# Patient Record
Sex: Female | Born: 1975 | Race: Black or African American | Hispanic: No | Smoking: Former smoker
Health system: Southern US, Community
[De-identification: ages and names within clinical notes are randomized; demographics above are authoritative.]

## PROBLEM LIST (undated history)

## (undated) ENCOUNTER — Inpatient Hospital Stay (HOSPITAL_COMMUNITY): Payer: Self-pay

## (undated) ENCOUNTER — Ambulatory Visit: Admission: EM | Payer: Medicare Other

## (undated) DIAGNOSIS — G43909 Migraine, unspecified, not intractable, without status migrainosus: Secondary | ICD-10-CM

## (undated) DIAGNOSIS — D649 Anemia, unspecified: Secondary | ICD-10-CM

## (undated) DIAGNOSIS — T4145XA Adverse effect of unspecified anesthetic, initial encounter: Secondary | ICD-10-CM

## (undated) DIAGNOSIS — M109 Gout, unspecified: Secondary | ICD-10-CM

## (undated) DIAGNOSIS — M1990 Unspecified osteoarthritis, unspecified site: Secondary | ICD-10-CM

## (undated) DIAGNOSIS — N186 End stage renal disease: Secondary | ICD-10-CM

## (undated) DIAGNOSIS — J45909 Unspecified asthma, uncomplicated: Secondary | ICD-10-CM

## (undated) DIAGNOSIS — F329 Major depressive disorder, single episode, unspecified: Secondary | ICD-10-CM

## (undated) DIAGNOSIS — F32A Depression, unspecified: Secondary | ICD-10-CM

## (undated) DIAGNOSIS — N289 Disorder of kidney and ureter, unspecified: Secondary | ICD-10-CM

## (undated) DIAGNOSIS — I10 Essential (primary) hypertension: Secondary | ICD-10-CM

## (undated) DIAGNOSIS — F419 Anxiety disorder, unspecified: Secondary | ICD-10-CM

## (undated) DIAGNOSIS — H33322 Round hole, left eye: Secondary | ICD-10-CM

## (undated) DIAGNOSIS — Z992 Dependence on renal dialysis: Secondary | ICD-10-CM

## (undated) DIAGNOSIS — Z9289 Personal history of other medical treatment: Secondary | ICD-10-CM

## (undated) DIAGNOSIS — R569 Unspecified convulsions: Secondary | ICD-10-CM

## (undated) DIAGNOSIS — T8859XA Other complications of anesthesia, initial encounter: Secondary | ICD-10-CM

## (undated) DIAGNOSIS — K219 Gastro-esophageal reflux disease without esophagitis: Secondary | ICD-10-CM

## (undated) HISTORY — PX: RENAL BIOPSY: SHX156

## (undated) HISTORY — PX: HYDRADENITIS EXCISION: SHX5243

---
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
## 1999-03-17 HISTORY — PX: CERVICAL BIOPSY  W/ LOOP ELECTRODE EXCISION: SUR135

## 2004-02-29 ENCOUNTER — Emergency Department (HOSPITAL_COMMUNITY): Admission: EM | Admit: 2004-02-29 | Discharge: 2004-03-01 | Payer: Self-pay | Admitting: Emergency Medicine

## 2004-12-20 ENCOUNTER — Emergency Department (HOSPITAL_COMMUNITY): Admission: EM | Admit: 2004-12-20 | Discharge: 2004-12-20 | Payer: Self-pay | Admitting: Emergency Medicine

## 2004-12-21 ENCOUNTER — Emergency Department (HOSPITAL_COMMUNITY): Admission: EM | Admit: 2004-12-21 | Discharge: 2004-12-21 | Payer: Self-pay | Admitting: Emergency Medicine

## 2005-07-04 ENCOUNTER — Emergency Department (HOSPITAL_COMMUNITY): Admission: EM | Admit: 2005-07-04 | Discharge: 2005-07-04 | Payer: Self-pay | Admitting: Emergency Medicine

## 2005-07-05 ENCOUNTER — Emergency Department (HOSPITAL_COMMUNITY): Admission: EM | Admit: 2005-07-05 | Discharge: 2005-07-05 | Payer: Self-pay | Admitting: Emergency Medicine

## 2006-01-07 ENCOUNTER — Ambulatory Visit (HOSPITAL_COMMUNITY): Admission: RE | Admit: 2006-01-07 | Discharge: 2006-01-07 | Payer: Self-pay | Admitting: Obstetrics

## 2006-05-06 ENCOUNTER — Ambulatory Visit (HOSPITAL_COMMUNITY): Admission: RE | Admit: 2006-05-06 | Discharge: 2006-05-06 | Payer: Self-pay | Admitting: Otolaryngology

## 2006-05-26 ENCOUNTER — Inpatient Hospital Stay (HOSPITAL_COMMUNITY): Admission: AD | Admit: 2006-05-26 | Discharge: 2006-05-27 | Payer: Self-pay | Admitting: Obstetrics and Gynecology

## 2006-08-17 ENCOUNTER — Emergency Department (HOSPITAL_COMMUNITY): Admission: EM | Admit: 2006-08-17 | Discharge: 2006-08-18 | Payer: Self-pay | Admitting: Emergency Medicine

## 2007-02-07 ENCOUNTER — Emergency Department (HOSPITAL_COMMUNITY): Admission: EM | Admit: 2007-02-07 | Discharge: 2007-02-07 | Payer: Self-pay | Admitting: Family Medicine

## 2007-02-23 ENCOUNTER — Inpatient Hospital Stay (HOSPITAL_COMMUNITY): Admission: AD | Admit: 2007-02-23 | Discharge: 2007-02-28 | Payer: Self-pay | Admitting: Psychiatry

## 2007-02-23 ENCOUNTER — Ambulatory Visit: Payer: Self-pay | Admitting: Psychiatry

## 2007-03-29 ENCOUNTER — Emergency Department (HOSPITAL_COMMUNITY): Admission: EM | Admit: 2007-03-29 | Discharge: 2007-03-29 | Payer: Self-pay | Admitting: Emergency Medicine

## 2007-07-31 ENCOUNTER — Emergency Department (HOSPITAL_COMMUNITY): Admission: EM | Admit: 2007-07-31 | Discharge: 2007-08-01 | Payer: Self-pay | Admitting: Emergency Medicine

## 2007-10-21 ENCOUNTER — Emergency Department (HOSPITAL_COMMUNITY): Admission: EM | Admit: 2007-10-21 | Discharge: 2007-10-21 | Payer: Self-pay | Admitting: Emergency Medicine

## 2008-03-16 HISTORY — PX: AV FISTULA PLACEMENT: SHX1204

## 2008-08-06 ENCOUNTER — Ambulatory Visit (HOSPITAL_COMMUNITY): Admission: RE | Admit: 2008-08-06 | Discharge: 2008-08-06 | Payer: Self-pay | Admitting: Specialist

## 2008-08-24 ENCOUNTER — Ambulatory Visit (HOSPITAL_COMMUNITY): Admission: RE | Admit: 2008-08-24 | Discharge: 2008-08-24 | Payer: Self-pay | Admitting: Nephrology

## 2008-09-10 ENCOUNTER — Encounter: Admission: RE | Admit: 2008-09-10 | Discharge: 2008-09-10 | Payer: Self-pay | Admitting: Family Medicine

## 2008-09-10 ENCOUNTER — Ambulatory Visit: Payer: Self-pay | Admitting: Surgery

## 2008-09-21 ENCOUNTER — Ambulatory Visit: Payer: Self-pay | Admitting: Surgery

## 2008-09-21 ENCOUNTER — Ambulatory Visit (HOSPITAL_COMMUNITY): Admission: RE | Admit: 2008-09-21 | Discharge: 2008-09-21 | Payer: Self-pay | Admitting: Surgery

## 2008-11-05 ENCOUNTER — Ambulatory Visit: Payer: Self-pay | Admitting: Surgery

## 2008-11-16 ENCOUNTER — Ambulatory Visit (HOSPITAL_COMMUNITY): Admission: RE | Admit: 2008-11-16 | Discharge: 2008-11-16 | Payer: Self-pay | Admitting: Vascular Surgery

## 2008-11-16 ENCOUNTER — Ambulatory Visit: Payer: Self-pay | Admitting: Vascular Surgery

## 2008-12-24 ENCOUNTER — Ambulatory Visit: Payer: Self-pay | Admitting: Surgery

## 2009-01-02 ENCOUNTER — Ambulatory Visit: Payer: Self-pay | Admitting: Surgery

## 2009-01-02 ENCOUNTER — Ambulatory Visit (HOSPITAL_COMMUNITY): Admission: RE | Admit: 2009-01-02 | Discharge: 2009-01-02 | Payer: Self-pay | Admitting: Surgery

## 2009-01-13 ENCOUNTER — Encounter: Admission: RE | Admit: 2009-01-13 | Discharge: 2009-01-13 | Payer: Self-pay | Admitting: Nephrology

## 2009-01-14 ENCOUNTER — Ambulatory Visit: Payer: Self-pay | Admitting: Surgery

## 2009-01-23 ENCOUNTER — Encounter: Admission: RE | Admit: 2009-01-23 | Discharge: 2009-01-23 | Payer: Self-pay | Admitting: Nephrology

## 2009-01-25 ENCOUNTER — Encounter: Admission: RE | Admit: 2009-01-25 | Discharge: 2009-01-25 | Payer: Self-pay | Admitting: Nephrology

## 2009-03-04 ENCOUNTER — Ambulatory Visit: Payer: Self-pay | Admitting: Surgery

## 2009-03-25 ENCOUNTER — Ambulatory Visit (HOSPITAL_COMMUNITY): Admission: RE | Admit: 2009-03-25 | Discharge: 2009-03-25 | Payer: Self-pay | Admitting: Nephrology

## 2009-04-10 ENCOUNTER — Ambulatory Visit (HOSPITAL_COMMUNITY): Admission: RE | Admit: 2009-04-10 | Discharge: 2009-04-10 | Payer: Self-pay | Admitting: Obstetrics

## 2009-07-09 ENCOUNTER — Emergency Department (HOSPITAL_COMMUNITY): Admission: EM | Admit: 2009-07-09 | Discharge: 2009-07-09 | Payer: Self-pay | Admitting: Family Medicine

## 2009-08-23 ENCOUNTER — Emergency Department (HOSPITAL_COMMUNITY): Admission: EM | Admit: 2009-08-23 | Discharge: 2009-08-23 | Payer: Self-pay | Admitting: Emergency Medicine

## 2009-08-28 ENCOUNTER — Emergency Department (HOSPITAL_COMMUNITY): Admission: EM | Admit: 2009-08-28 | Discharge: 2009-08-28 | Payer: Self-pay | Admitting: Family Medicine

## 2009-09-02 ENCOUNTER — Ambulatory Visit (HOSPITAL_BASED_OUTPATIENT_CLINIC_OR_DEPARTMENT_OTHER): Admission: RE | Admit: 2009-09-02 | Discharge: 2009-09-02 | Payer: Self-pay | Admitting: Specialist

## 2009-09-30 ENCOUNTER — Ambulatory Visit: Payer: Self-pay | Admitting: Surgery

## 2009-12-12 ENCOUNTER — Ambulatory Visit: Payer: Self-pay | Admitting: Surgery

## 2010-03-03 ENCOUNTER — Emergency Department (HOSPITAL_COMMUNITY)
Admission: EM | Admit: 2010-03-03 | Discharge: 2010-03-03 | Disposition: A | Discharge status: Other Acute Inpatient Hospital | Payer: Self-pay | Source: Home / Self Care | Admitting: Emergency Medicine

## 2010-03-03 ENCOUNTER — Encounter (INDEPENDENT_AMBULATORY_CARE_PROVIDER_SITE_OTHER): Payer: Self-pay | Admitting: Emergency Medicine

## 2010-03-03 ENCOUNTER — Emergency Department (HOSPITAL_COMMUNITY)
Admission: EM | Admit: 2010-03-03 | Discharge: 2010-03-03 | Payer: Self-pay | Source: Home / Self Care | Admitting: Emergency Medicine

## 2010-05-30 ENCOUNTER — Other Ambulatory Visit: Payer: Self-pay | Admitting: Emergency Medicine

## 2010-05-30 ENCOUNTER — Emergency Department (HOSPITAL_COMMUNITY)
Admission: EM | Admit: 2010-05-30 | Discharge: 2010-05-30 | Disposition: A | Discharge status: Home / Self Care | Payer: Medicare Other | Attending: Emergency Medicine | Admitting: Emergency Medicine

## 2010-05-30 DIAGNOSIS — H5710 Ocular pain, unspecified eye: Secondary | ICD-10-CM | POA: Insufficient documentation

## 2010-05-30 DIAGNOSIS — R509 Fever, unspecified: Secondary | ICD-10-CM | POA: Insufficient documentation

## 2010-05-30 DIAGNOSIS — M549 Dorsalgia, unspecified: Secondary | ICD-10-CM | POA: Insufficient documentation

## 2010-05-30 DIAGNOSIS — L732 Hidradenitis suppurativa: Secondary | ICD-10-CM | POA: Insufficient documentation

## 2010-05-30 DIAGNOSIS — Z79899 Other long term (current) drug therapy: Secondary | ICD-10-CM | POA: Insufficient documentation

## 2010-05-30 DIAGNOSIS — J309 Allergic rhinitis, unspecified: Secondary | ICD-10-CM | POA: Insufficient documentation

## 2010-05-30 DIAGNOSIS — Z992 Dependence on renal dialysis: Secondary | ICD-10-CM | POA: Insufficient documentation

## 2010-05-30 DIAGNOSIS — M7989 Other specified soft tissue disorders: Secondary | ICD-10-CM | POA: Insufficient documentation

## 2010-05-30 DIAGNOSIS — M25539 Pain in unspecified wrist: Secondary | ICD-10-CM | POA: Insufficient documentation

## 2010-05-30 DIAGNOSIS — H9209 Otalgia, unspecified ear: Secondary | ICD-10-CM | POA: Insufficient documentation

## 2010-05-30 DIAGNOSIS — R51 Headache: Secondary | ICD-10-CM | POA: Insufficient documentation

## 2010-05-30 DIAGNOSIS — J3489 Other specified disorders of nose and nasal sinuses: Secondary | ICD-10-CM | POA: Insufficient documentation

## 2010-05-30 DIAGNOSIS — M79609 Pain in unspecified limb: Secondary | ICD-10-CM | POA: Insufficient documentation

## 2010-05-30 DIAGNOSIS — K219 Gastro-esophageal reflux disease without esophagitis: Secondary | ICD-10-CM | POA: Insufficient documentation

## 2010-05-30 DIAGNOSIS — F329 Major depressive disorder, single episode, unspecified: Secondary | ICD-10-CM | POA: Insufficient documentation

## 2010-05-30 DIAGNOSIS — N186 End stage renal disease: Secondary | ICD-10-CM | POA: Insufficient documentation

## 2010-05-30 DIAGNOSIS — R6889 Other general symptoms and signs: Secondary | ICD-10-CM | POA: Insufficient documentation

## 2010-05-30 DIAGNOSIS — F3289 Other specified depressive episodes: Secondary | ICD-10-CM | POA: Insufficient documentation

## 2010-06-01 LAB — POCT I-STAT, CHEM 8
BUN: 45 mg/dL — ABNORMAL HIGH (ref 6–23)
Chloride: 105 mEq/L (ref 96–112)
Sodium: 140 mEq/L (ref 135–145)

## 2010-06-01 LAB — DIFFERENTIAL
Basophils Absolute: 0 10*3/uL (ref 0.0–0.1)
Lymphocytes Relative: 32 % (ref 12–46)
Neutro Abs: 4 10*3/uL (ref 1.7–7.7)

## 2010-06-01 LAB — BASIC METABOLIC PANEL
BUN: 28 mg/dL — ABNORMAL HIGH (ref 6–23)
Calcium: 9.4 mg/dL (ref 8.4–10.5)
GFR calc non Af Amer: 5 mL/min — ABNORMAL LOW (ref >60)
Glucose, Bld: 123 mg/dL — ABNORMAL HIGH (ref 70–99)
Sodium: 134 mEq/L — ABNORMAL LOW (ref 135–145)

## 2010-06-01 LAB — CBC
Hemoglobin: 11.4 g/dL — ABNORMAL LOW (ref 12.0–15.0)
Platelets: 229 10*3/uL (ref 150–400)
RDW: 13.9 % (ref 11.5–15.5)
WBC: 6.6 10*3/uL (ref 4.0–10.5)

## 2010-06-02 LAB — POCT I-STAT, CHEM 8
BUN: 33 mg/dL — ABNORMAL HIGH (ref 6–23)
Chloride: 107 mEq/L (ref 96–112)
Potassium: 3.3 mEq/L — ABNORMAL LOW (ref 3.5–5.1)
Sodium: 139 mEq/L (ref 135–145)

## 2010-06-02 LAB — RAPID STREP SCREEN (MED CTR MEBANE ONLY): Streptococcus, Group A Screen (Direct): NEGATIVE

## 2010-06-19 LAB — POCT I-STAT, CHEM 8
BUN: 38 mg/dL — ABNORMAL HIGH (ref 6–23)
Creatinine, Ser: 9.2 mg/dL — ABNORMAL HIGH (ref 0.4–1.2)
Hemoglobin: 13.3 g/dL (ref 12.0–15.0)
Potassium: 3.8 mEq/L (ref 3.5–5.1)
Sodium: 137 mEq/L (ref 135–145)

## 2010-06-20 LAB — POCT I-STAT 4, (NA,K, GLUC, HGB,HCT)
Hemoglobin: 16.3 g/dL — ABNORMAL HIGH (ref 12.0–15.0)
Potassium: 3.8 mEq/L (ref 3.5–5.1)
Sodium: 136 mEq/L (ref 135–145)

## 2010-06-22 LAB — POCT I-STAT 4, (NA,K, GLUC, HGB,HCT)
HCT: 37 % (ref 36.0–46.0)
Hemoglobin: 12.6 g/dL (ref 12.0–15.0)
Potassium: 3.8 mEq/L (ref 3.5–5.1)
Potassium: 4.2 mEq/L (ref 3.5–5.1)
Sodium: 137 mEq/L (ref 135–145)

## 2010-06-22 LAB — CBC
HCT: 35 % — ABNORMAL LOW (ref 36.0–46.0)
Platelets: 254 10*3/uL (ref 150–400)
RDW: 16 % — ABNORMAL HIGH (ref 11.5–15.5)

## 2010-06-24 LAB — BASIC METABOLIC PANEL
BUN: 62 mg/dL — ABNORMAL HIGH (ref 6–23)
Creatinine, Ser: 10.34 mg/dL — ABNORMAL HIGH (ref 0.4–1.2)
GFR calc Af Amer: 5 mL/min — ABNORMAL LOW (ref >60)
GFR calc non Af Amer: 4 mL/min — ABNORMAL LOW (ref >60)

## 2010-06-24 LAB — POCT HEMOGLOBIN-HEMACUE: Hemoglobin: 7.4 g/dL — CL (ref 12.0–15.0)

## 2010-06-24 LAB — HCG, SERUM, QUALITATIVE: Preg, Serum: NEGATIVE

## 2010-07-02 ENCOUNTER — Ambulatory Visit: Payer: Medicare Other | Admitting: Vascular Surgery

## 2010-07-16 ENCOUNTER — Ambulatory Visit (INDEPENDENT_AMBULATORY_CARE_PROVIDER_SITE_OTHER): Payer: Medicare Other | Admitting: Vascular Surgery

## 2010-07-16 DIAGNOSIS — N186 End stage renal disease: Secondary | ICD-10-CM

## 2010-07-16 DIAGNOSIS — T82898A Other specified complication of vascular prosthetic devices, implants and grafts, initial encounter: Secondary | ICD-10-CM

## 2010-07-17 NOTE — Assessment & Plan Note (Signed)
OFFICE VISIT  NATHANIA, GURA DOB:  Apr 20, 1975                                       07/16/2010 CHART#:[id number]  I saw patient in the office today to evaluate her for possible steal syndrome of her left arm.  This is a pleasant 35 year old woman with chronic kidney disease who dialyzes Tuesdays, Thursdays, and Saturdays. She had a left radiocephalic fistula placed in July 2010.  At that time, it was noted that she had a fairly small artery.  She had ligation of 2 competing branches her fistula in September 2010 and then in October 2010 had a fistulogram which showed no problems with the fistula. Ultimately the fistula, on the short end she has been using this for dialysis.  She states that approximately 2 months ago, she noted the gradual onset of pain and paresthesias in the left hand, which has been fairly constant.  Symptoms have remained relatively stable over the last 2 months.  Symptoms increase somewhat when she is on dialysis.  She has had no recent uremic symptoms.  Specifically, she denies nausea, vomiting, fatigue or anorexia.  REVIEW OF SYSTEMS:  She has had no chest pain, chest pressure, palpitations or arrhythmias.  PHYSICAL EXAMINATION:  This is a pleasant 35 year old woman who appears her stated age.  Blood pressure is 107/75, heart rate is 79, temperature 97.4.  Lungs are clear bilaterally to auscultation.  Her forearm fistula on the left has an excellent thrill.  She does have a radial, ulnar, and palmar arch signal with a Doppler on the left hand which augments with compression of her fistula.  She also states she has had some intermittent swelling in her arm, although currently there is no swelling in her left arm.  I have explained that I think she does have evidence of a steal syndrome that is mild.  If this progresses, certainly she could require ligation of her graft.  I have recommended that we proceed with a fistulogram  to see if there is any evidence of inflow problem or other thing that could potentially be corrected to salvage her fistula and also correct her steal syndrome.  The fistula is a radiocephalic fistula; therefore, I do not think she is really a candidate for a drill procedure.  If the fistulogram does not allow me to fully evaluate the arterial system, then when she comes for follow-up, I think we will get a duplex of her left upper extremity.    Judeth Cornfield. Scot Dock, M.D. Electronically Signed  CSD/MEDQ  D:  07/16/2010  T:  07/17/2010  Job:  [id number]  cc:   Windy Kalata, M.D.

## 2010-07-28 ENCOUNTER — Ambulatory Visit (HOSPITAL_COMMUNITY)
Admission: RE | Admit: 2010-07-28 | Discharge: 2010-07-28 | Disposition: A | Discharge status: Home / Self Care | Payer: Medicare Other | Source: Ambulatory Visit | Attending: Vascular Surgery | Admitting: Vascular Surgery

## 2010-07-28 DIAGNOSIS — Z992 Dependence on renal dialysis: Secondary | ICD-10-CM | POA: Insufficient documentation

## 2010-07-28 DIAGNOSIS — M79609 Pain in unspecified limb: Secondary | ICD-10-CM | POA: Insufficient documentation

## 2010-07-28 DIAGNOSIS — G458 Other transient cerebral ischemic attacks and related syndromes: Secondary | ICD-10-CM

## 2010-07-28 DIAGNOSIS — N186 End stage renal disease: Secondary | ICD-10-CM | POA: Insufficient documentation

## 2010-07-28 LAB — POCT I-STAT, CHEM 8
BUN: 38 mg/dL — ABNORMAL HIGH (ref 6–23)
Calcium, Ion: 1.08 mmol/L — ABNORMAL LOW (ref 1.12–1.32)
Creatinine, Ser: 12.8 mg/dL — ABNORMAL HIGH (ref 0.4–1.2)
Glucose, Bld: 95 mg/dL (ref 70–99)
TCO2: 30 mmol/L (ref 0–100)

## 2010-07-29 NOTE — Procedures (Signed)
VASCULAR LAB EXAM   INDICATION:  Status post left upper extremity AV fistula.   HISTORY:  Diabetes:  No.  Cardiac:  No.  Hypertension:  No.  Other:  Left radiocephalic AV fistula placed on September 21, 2008.   EXAM:  Left upper extremity AV fistula duplex.   IMPRESSION:  1. Patent left radiocephalic arteriovenous fistula with Doppler      velocity suggestive of a greater than 75% stenosis of the      anastomosis.  2. There is a patent branch noted at the left antecubital fossa level      of the cephalic vein measuring 123XX123 cm which appears to communicate      with the deep venous system.   ___________________________________________  V. Leia Alf, MD   CH/MEDQ  D:  11/05/2008  T:  11/05/2008  Job:  [id number]

## 2010-07-29 NOTE — Discharge Summary (Signed)
NAME:  Desiree [name], Desiree [name] NO.:  [id number]   MEDICAL RECORD NO.:  [id number]          PATIENT TYPE:  IPS   LOCATION:  0402                          FACILITY:  [facility]   PHYSICIAN:  Norm Salt, MD  DATE OF BIRTH:  19-May-1975   DATE OF ADMISSION:  02/23/2007  DATE OF DISCHARGE:  02/28/2007                               DISCHARGE SUMMARY   IDENTIFYING DATA AND REASON FOR ADMISSION:  This was an inpatient  psychiatric admission for Desiree [name], a 35 year old, separated African  American female who went to her usual mental health outpatient  appointment and then was immediately referred to our inpatient service.  They thought I was a harm to myself.  The patient admitted to having  some suicidal ideation and thoughts.  She lives with her daughter and  boyfriend.  She admitted to one previous inpatient psychiatric admission  elsewhere.  Please refer to the admission note for further details  pertaining to the symptoms, circumstances and history that led to her  hospitalization.  She was given an initial Axis I diagnosis of mood  disorder NOS, rule out psychotic disorder NOS, and cannabis abuse.   MEDICAL AND LABORATORY:  The patient had a history of renal failure,  dialysis dependent, which she stated was due to nephrotic syndrome.  The  patient had been getting her usual dialysis treatments in the [facility] area.  While she was with [place], we were able to arrange for a regular  dialysis sessions.  There were no significant medical issues.   HOSPITAL COURSE:  The patient was admitted to the adult inpatient  psychiatric service.  She presented as a well-nourished, well-developed  woman who was alert, fully oriented, with depressed mood and flat  affect.  She was somewhat guarded.  She admitted to suicidal ideation,  but it was able to contract for safety.  There were no signs or symptoms  of psychosis or thought disorder, but she did report auditory  hallucinations.   She  was involved in the therapeutic milieu.  She was started on a trial  of Zoloft 25 mg daily as a low-dose, and it was felt that this was best  with her renal status.  She was also treated with Protonix 20 mg daily  for symptoms of GERD.  She was involved in the therapeutic milieu, and  was a reasonably good participant in the treatment program.  We explored  her available supports.  She remained depressed and isolative, but made  no delusional statements and had no further active suicidal ideation.  We explored the possibility of her being followed by a community support  team.   By the sixth hospital day, the patient had been absent of suicidal  ideation for several days, was tolerating medications, was brighter of  mood and affect and appeared appropriate for discharge.  She agreed  following aftercare plan.   AFTERCARE:  The patient was to follow up at [facility] for an  appointment with her psychiatrist on March 08, 2007.  The patient was  instructed to get a followup appointment with her  usual nephrology  clinic in the [facility] area.   DISCHARGE MEDICATIONS:  1. Protonix 20 mg daily.  2. Zoloft 25 mg daily.   DISCHARGE DIAGNOSES:  AXIS I:  Major depressive disorder, recurrent.  AXIS II:  Deferred.  AXIS III:  Gastroesophageal reflux disease and renal failure, chronic.  AXIS IV:  Stressors, severe.  AXIS V:  Global assessment of functioning on discharge, 60.      Norm Salt, MD  Electronically Signed     SPB/MEDQ  D:  03/24/2007  T:  03/25/2007  Job:  [id number]

## 2010-07-29 NOTE — Procedures (Signed)
VASCULAR LAB EXAM   INDICATION:  Poorly maturing AV fistula.    HISTORY:  Diabetes:  no  Cardiac:  no  Hypertension:  yes   EXAM:  Left forearm AV fistula duplex.   IMPRESSION:  1. Patent left arteriovenous fistula with mildly increased velocity of      519 cm/sec noted at the anastomosis with no focal area narrowing.  2. No evidence of patent branches of the left outflow vein.  3. Retrograde left radial artery flow is noted at the wrist level that      becomes antegrade with fistular compression.  This indicates a      fistula steal.    ___________________________________________  V. Leia Alf, MD   CH/MEDQ  D:  12/24/2008  T:  12/25/2008  Job:  [id number]

## 2010-07-29 NOTE — Op Note (Signed)
Desiree [name], Desiree [name]               ACCOUNT NO.:  [id number]   MEDICAL RECORD NO.:  [id number]          PATIENT TYPE:  AMB   LOCATION:  SDS                          FACILITY:  [facility]   PHYSICIAN:  Theotis Burrow IV, MDDATE OF BIRTH:  [birth year]   DATE OF PROCEDURE:  09/21/2008  DATE OF DISCHARGE:  09/21/2008                               OPERATIVE REPORT   PREOPERATIVE DIAGNOSIS:  End-stage renal disease.   POSTOPERATIVE DIAGNOSIS:  End-stage renal disease.   PROCEDURE PERFORMED:  Left radiocephalic fistula.   TYPE OF ANESTHESIA:  General.   COMPLICATIONS:  None.   FINDINGS:  Small artery.   PROCEDURE:  The patient was identified in the holding area and taken to  room #6.  She was placed supine on the table.  General endotracheal  anesthesia was administered.  The patient was prepped and draped in the  standard sterile fashion.  A time-out was called.  Antibiotics were  given.  Ultrasound was used to identify the cephalic vein in the lower  arm.  The cephalic vein appeared to be adequate size, approximately 3 mm  by ultrasound.  It was marked with an ink pen.  A transverse incision  was made 2 fingerbreadths proximal to the radial head.  Cautery was used  to divide the subcutaneous tissue.  The cephalic vein was identified  within the wound and encircled with vessel loop.  It was then mobilized  proximally and distally.  Distally, there was 1 large branch.  Once the  vein was fully exposed and mobilized, it was marked with an ink pen for  it to ensure proper orientation.  Next, attention was turned towards the  artery.  The fascia was divided sharply.  The radial artery was  identified within the wound.  This was a small artery, approximately 1.5  mm.  Once the artery had been adequately exposed, the patient was given  3000 units of heparin.  The 2 individual branches of the vein were each  individually isolated and divided.  There was good backbleeding from the  vein.  Next,  I felt there was enough length on the vein to divide the  vein at the branch point.  The artery was then occluded.  A #11 was used  to make an arteriotomy, which was extended with Potts scissors.  Again,  I again ensured that there was enough length on the vein to divide at  the branch point.  This was done and then an end-to-side anastomosis was  created using a running 7-0 Prolene.  One repair stitch was required for  hemostasis.  The artery was flushed appropriately prior to completing  anastomosis.  Once the anastomosis was completed, Dopplers used to  evaluate signal on the radial artery, proximal and distal to the  anastomosis, there was adequate signal.  I could audibly hear the vein  up to the upper forearm.  I inspected the course of the vein to make  sure there were no kinks.  Next, the wound was irrigated.  The deep  tissue was closed with 3-0 Vicryl.  Skin was closed  with 3-0 Vicryl.  The patient tolerated the procedure well.  There were no complications.     Eldridge Abrahams, MD  Electronically Signed    VWB/MEDQ  D:  09/21/2008  T:  09/22/2008  Job:  [id number]

## 2010-07-29 NOTE — Assessment & Plan Note (Signed)
OFFICE VISIT   [name], AX  DOB:  03-17-1975                                       01/14/2009  CHART#:[id number]   REASON FOR VISIT:  Evaluate fistula.   HISTORY:  This is a 35 year old female with end-stage renal disease and  nephrotic syndrome who had a left wrist fistula placed in July 2010.  This did not mature.  A competing branch was found, and this was ligated  in September.  I recently took her for a fistulogram to rule out  possible stenosis within her arterial anastomosis, and this was widely  patent.  She comes back in today, as they are not willing to access her  in dialysis.  She is also complaining of numbness and pain in all 4  extremities.  She recently had an MRI by Dr. Hassell Done yesterday.  Otherwise  she is without complaints.   She denies chest pain and shortness of breath.  She does have pain in  her legs from walking and lying flat.  She is having headaches.  She  does have depression/anxiety, recent change in eyesight and hearing as  well as anemia.  She continues to be a nonsmoker.   PHYSICAL EXAMINATION:  Blood pressure 103/71, heart rate 99, temperature  98.4.  GENERAL:  She is well-appearing in no distress.  CARDIOVASCULAR:  Regular rate and rhythm.  Respirations nonlabored.  EXTREMITIES:  Warm,  well perfused.  Neurologically, she is intact.  The left arm:  I am able  to palpate the fistula all the way up across the antecubital crease.  It  is on the deep side; however, it is still palpable.   ASSESSMENT/PLAN:  Left arm fistula:  At this point, I would recommend  accessing the fistula.  It is certainly palpable and noted by  fistulogram that the vein is nicely dilated.  My only concern is that it  is a little on the deep side.  If they are unable to access it secondary  to the depth, I could defat and move this fistula a little closer to the  skin.  The patient will contact me if she needs this done, otherwise she  is going to  have her fistula accessed tomorrow.   Desiree Abrahams, MD  Electronically Signed   VWB/MEDQ  D:  01/14/2009  T:  01/15/2009  Job:  [id number]   cc:   Windy Kalata, M.D.

## 2010-07-29 NOTE — Assessment & Plan Note (Signed)
OFFICE VISIT   ROSILAND, YOUNGSTROM  DOB:  1975-11-14                                       12/24/2008  CHART#:[id number]   REASON FOR VISIT:  Follow up fistula.   REFERRING PHYSICIAN:  Asthmatic weight.   HISTORY:  This is a 35 year old female with end-stage renal disease,  currently dialyzing via a catheter.  She underwent a left radiocephalic  fistula on September 21, 2008, that did not mature.  Follow-up ultrasound  revealed an enlarged branch at the antecubital crease as well as  elevated velocities at the arterial anastomosis.  She underwent ligation  of her branch by Dr. Kellie Simmering on 11/16/2008.  She comes back in today with  still concerns over a non-maturing fistula.  She also complains of pain  in her left hand.   On exam, her blood pressure is 103/71, pulse 91.  She is well-appearing,  in no distress.  Respirations are nonlabored.  I can palpate a thrill  within the left distal forearm; however, it has become somewhat more  difficult to feel as you go up the arm.   An ultrasound was performed today, which reveals no significant  branches.  There is a velocity elevation at the anastomosis; however, no  focal stenosis was identified.  There is retrograde left radial artery  flow at the wrist that became antegrade with compression.   At this point I feel we should proceed with a fistulogram and attempt a  balloon angioplasty of the arteriovenous anastomosis if that appears to  be the problem.  If not, what I am concerned is that the patient's vein  is too deep.  By ultrasound it measured approximately 1 cm in depth at  its most superficial location.  She could making her fistula more  superficial; however, I would like to address her arterial stenosis  prior to doing so.  I am also somewhat concerned that the pain and  discomfort that she has been complaining of could be related to steal  syndrome.  I have encouraged her to work with her exercise ball, and  we  will evaluate to see if this resolves.  I am going to schedule her for a  fistulogram with dilation of her arterial anastomosis on Wednesday  October 20.   Eldridge Abrahams, MD  Electronically Signed   VWB/MEDQ  D:  12/24/2008  T:  12/25/2008  Job:  [id number]   cc:   Windy Kalata, M.D.

## 2010-07-29 NOTE — Assessment & Plan Note (Signed)
OFFICE VISIT   Desiree [name], Desiree [name]  DOB:  1975-09-11                                       11/05/2008  CHART#:[id number]   REASON FOR VISIT:  Followup.   HISTORY:  This is a 53-year female with end-stage renal disease who  underwent left radiocephalic fistula placed on September 21, 2008, and this  did not mature appropriately.  She had an ultrasound today which shows  velocity elevation at the anastomosis but more impressive was a large  branch at the antecubital fossa measuring almost 0.5 cm in diameter.  I  think it would be most appropriate to perform branch ligation of this  branch at the level of antecubital fossa.  This will be scheduled at the  patient's earliest convenience.   Eldridge Abrahams, MD  Electronically Signed   VWB/MEDQ  D:  11/05/2008  T:  11/06/2008  Job:  [id number]   cc:   [facility]

## 2010-07-29 NOTE — Procedures (Signed)
CEPHALIC VEIN MAPPING   INDICATION:  Preop.   HISTORY:  End-stage renal disease.   EXAM:  Right cephalic vein is compressible with diameter measurements  ranging from 0.3 to 0.39.   The left cephalic vein is compressible with diameter measurements  ranging from 0.28 to 0.41.   See attached for all measurements.   IMPRESSION:  Patent bilateral cephalic veins which are of acceptable  diameter for use as a dialysis access site.   ___________________________________________  V. Leia Alf, MD   MG/MEDQ  D:  09/10/2008  T:  09/10/2008  Job:  [id number]

## 2010-07-29 NOTE — Assessment & Plan Note (Signed)
OFFICE VISIT   Desiree [name], Desiree [name]  DOB:  13-Sep-1975                                       09/10/2008  CHART#:[id number]   REASON FOR VISIT:  Dialysis access.   HISTORY:  This is a 35 year old female with end-stage renal disease who  began dialysis on May 26.  Her dialysis is secondary to nephrotic  syndrome.  She has a right-sided catheter placed.  She is right-handed.   PAST MEDICAL HISTORY:  End-stage renal disease and nephrotic syndrome.   PAST SURGICAL HISTORY:  A LEEP.   FAMILY HISTORY:  Negative for history of dialysis.   SOCIAL HISTORY:  She is single.  Does not smoke, does not drink.   REVIEW OF SYSTEMS:  GENERAL:  Positive for weight gain and loss of  appetite.  CARDIAC:  Negative.  PULMONARY:  Negative.  GI:  Negative.  GU:  Positive for kidney disease and burning with urination.  VASCULAR:  Negative.  NEUROLOGIC:  Positive for dizziness and seizures in the past.  ORTHOPEDIC:  Positive for joint pain and muscle pain.  PSYCHIATRIC:  Positive for depression.  ENT:  Positive for hearing and eyesight changes.  HEMATOLOGIC:  Positive for anemia.   MEDICATIONS:  Include Klonopin, Tums, Bactrim, Benadryl, PhosLo, Ultram,  Nephro-Vite.   ALLERGIES:  Prednisone.   PHYSICAL EXAMINATION:  Blood pressure is 102/70, pulse 80.  General:  She is well-appearing, in no distress.  Normocephalic, atraumatic.  Cardiovascular:  Regular rate and rhythm.  Pulmonary:  Lungs clear  bilaterally.  Extremities are warm and well-perfused.  Neurologically,  she is intact.  The patient has a palpable left radial pulse and left  brachial pulse.   DIAGNOSTIC STUDIES:  Vein mapping was performed today.  Her cephalic  vein on the left measures 0.28 at the wrist and 0.3 to 0.4 in the upper  arm.   ASSESSMENT/PLAN:  End-stage renal disease, needing permanent access.   PLAN:  The patient will be scheduled for a left wrist fistula.  I  discussed the procedure with the  patient.  She understands that she may  require additional operations to get her fistula to be a success.  She  also understands that should her vein not look adequate in the operating  room, we will proceed with a left upper arm fistula.  Her operation has  been scheduled for Friday, July 9.   Eldridge Abrahams, MD  Electronically Signed   VWB/MEDQ  D:  09/10/2008  T:  09/11/2008  Job:  [id number]   cc:   Dr. Jimmy Footman

## 2010-07-30 NOTE — Op Note (Signed)
  NAME:  Desiree [name], Desiree [name]               ACCOUNT NO.:  [id number]  MEDICAL RECORD NO.:  [id number]           PATIENT TYPE:  O  LOCATION:  SDSC                         FACILITY:  [facility]  PHYSICIAN:  Judeth Cornfield. Scot Dock, M.D.DATE OF BIRTH:  01-17-1976  DATE OF PROCEDURE:  07/28/2010 DATE OF DISCHARGE:                              OPERATIVE REPORT   PREOPERATIVE DIAGNOSIS:  Possible steal syndrome, left upper extremity.  POSTOPERATIVE DIAGNOSIS:  Possible steal syndrome, left upper extremity.  PROCEDURES: 1. Ultrasound-guided access to the left AV fistula. 2. Left fistulogram and central venogram.  FINDINGS:  No significant stenosis is noted within the AV fistula or the radial artery at the level of the wrist.  TECHNIQUE:  The patient was taken to the PV lab and the left upper extremity was prepped and draped in usual sterile fashion.  After the skin was anesthetized with 1% lidocaine and under ultrasound guidance, the proximal fistula was cannulated using a micropuncture set and the micropuncture sheath was introduced into the fistula.  Using multiple hand injections, the fistula was studied from the wrist all the way to the central veins.  The fistula was compressed to evaluate the arterial anastomosis.  FINDINGS:  The AV fistula was widely patent with one competing branch at the mid humeral level.  There is no central venous stenosis noted.  The subclavian and brachiocephalic veins were widely patent as is the SVC. The cephalic vein in the upper arm and forearm was widely patent.  The radial artery was patent with no significant arterial stenosis or anastomotic stenosis noted.  At the completion, a 4-0 Monocryl was placed with good hemostasis.  No immediate complications were noted.     Judeth Cornfield. Scot Dock, M.D.     CSD/MEDQ  D:  07/28/2010  T:  07/28/2010  Job:  [id number]  cc:   Windy Kalata, M.D.  Electronically Signed by [name] M.D. on  07/30/2010 11:42:25 AM

## 2010-08-06 ENCOUNTER — Ambulatory Visit (INDEPENDENT_AMBULATORY_CARE_PROVIDER_SITE_OTHER): Payer: Medicare Other | Admitting: Vascular Surgery

## 2010-08-06 ENCOUNTER — Encounter (INDEPENDENT_AMBULATORY_CARE_PROVIDER_SITE_OTHER): Payer: Medicare Other

## 2010-08-06 DIAGNOSIS — T85695A Other mechanical complication of other nervous system device, implant or graft, initial encounter: Secondary | ICD-10-CM

## 2010-08-06 DIAGNOSIS — Z48812 Encounter for surgical aftercare following surgery on the circulatory system: Secondary | ICD-10-CM

## 2010-08-06 DIAGNOSIS — N186 End stage renal disease: Secondary | ICD-10-CM

## 2010-08-07 NOTE — Assessment & Plan Note (Signed)
OFFICE VISIT  MAKELA, CARREAU DOB:  1975/11/22                                       08/06/2010 CHART#:[id number]  I saw patient in the office today for continued follow-up of the pain in her left hand and possible steal syndrome.  I saw her on 07/16/10.  She had a left radiocephalic fistula placed in July 2010.  She states today that she has actually had some pain in the hand since the fistula was placed, although over the last several months, the pain had been more significant.  On exam, she had strong Doppler flow in the left hand which did augment somewhat with compression of her fistula, and I thought she had a mild steal.  She had a fistulogram on 07/28/10 which showed that her fistula was widely patent with only 1 competing branch at the mid humeral level with no central venous stenosis noted.  The subclavian and brachial cephalic veins were widely patent as was the SVC.  The radial artery was patent with no significant arterial stenosis or anastomotic stenosis noted.  She came in today for continued follow- up.  She states she still has some pain in the left hand, although it has been tolerable.  She states that she has really had it for 2 years. She states that her symptoms are somewhat worse when she is on dialysis. There are no other aggravating or alleviating factors.  REVIEW OF SYSTEMS:  She has had no recent chest pain or chest pressure.  PHYSICAL EXAMINATION:  This is a pleasant 35 year old woman who appears her stated age.  Blood pressure is 144/81, heart rate is 74, respiratory rate is 20.  Her fistula has an excellent thrill.  She has a palpable radial pulse on the left.  The hand is warm and well-perfused.  We did obtain a duplex of her upper extremity artery on the left, and this did show some elevated velocities in the axillary artery and minimally in the brachial artery, although these did not appear to be severe areas of stenosis  but moderate areas of narrowing possibly.  I have discussed with her today the option of proceeding with an arteriogram in order to fully evaluate her upper extremity artery; however, she feels that her symptoms are quite tolerable and does not want to pursue arteriography unless her symptoms progress.  Therefore, we will see her back p.r.n.  If her symptoms progress, I think we need to schedule her for an arteriogram to further evaluate upper extremity arteries, as there is no problem with her fistula that I see otherwise. If she does ultimately proceed with arteriography, she will need to be premedicated, as she states that she has had itching every time she has had contrast.    Judeth Cornfield. Scot Dock, M.D. Electronically Signed  CSD/MEDQ  D:  08/06/2010  T:  08/07/2010  Job:  [id number]  cc:   Windy Kalata, M.D.

## 2010-08-13 NOTE — Procedures (Unsigned)
VASCULAR LAB EXAM  INDICATION:  Complications with radiocephalic fistula.  HISTORY: Diabetes:  No. Cardiac:  No. Hypertension:  Yes.  EXAM:  Left upper extremity arterial duplex.  IMPRESSION: 1. No plaque or diameter changes identified suggestive of stenosis. 2. Velocity changes noted in the axillary artery from 264 cm/s to 150     cm/s and then increasing to 287 cm/s in the proximal brachial     artery. The velocity changes may be suggestive of hemodynamically     significant stenosis. 3. Significant decrease in velocity involving the left ulnar artery     from 186 cm/s to 58 cm/s, which may be suggestive of     hemodynamically significant stenosis.     ___________________________________________ Judeth Cornfield. Scot Dock, M.D.  SH/MEDQ  D:  08/06/2010  T:  08/06/2010  Job:  [id number]

## 2010-10-16 ENCOUNTER — Emergency Department (HOSPITAL_COMMUNITY): Payer: Medicare Other

## 2010-10-16 ENCOUNTER — Emergency Department (HOSPITAL_COMMUNITY)
Admission: EM | Admit: 2010-10-16 | Discharge: 2010-10-16 | Disposition: A | Discharge status: Home / Self Care | Payer: Medicare Other | Attending: Emergency Medicine | Admitting: Emergency Medicine

## 2010-10-16 DIAGNOSIS — R51 Headache: Secondary | ICD-10-CM | POA: Insufficient documentation

## 2010-10-16 DIAGNOSIS — M25529 Pain in unspecified elbow: Secondary | ICD-10-CM | POA: Insufficient documentation

## 2010-10-16 DIAGNOSIS — Z992 Dependence on renal dialysis: Secondary | ICD-10-CM | POA: Insufficient documentation

## 2010-10-16 DIAGNOSIS — M25519 Pain in unspecified shoulder: Secondary | ICD-10-CM | POA: Insufficient documentation

## 2010-10-16 DIAGNOSIS — F329 Major depressive disorder, single episode, unspecified: Secondary | ICD-10-CM | POA: Insufficient documentation

## 2010-10-16 DIAGNOSIS — K219 Gastro-esophageal reflux disease without esophagitis: Secondary | ICD-10-CM | POA: Insufficient documentation

## 2010-10-16 DIAGNOSIS — M79609 Pain in unspecified limb: Secondary | ICD-10-CM | POA: Insufficient documentation

## 2010-10-16 DIAGNOSIS — S139XXA Sprain of joints and ligaments of unspecified parts of neck, initial encounter: Secondary | ICD-10-CM | POA: Insufficient documentation

## 2010-10-16 DIAGNOSIS — N186 End stage renal disease: Secondary | ICD-10-CM | POA: Insufficient documentation

## 2010-10-16 DIAGNOSIS — F3289 Other specified depressive episodes: Secondary | ICD-10-CM | POA: Insufficient documentation

## 2010-12-08 ENCOUNTER — Emergency Department (HOSPITAL_COMMUNITY)
Admission: EM | Admit: 2010-12-08 | Discharge: 2010-12-08 | Discharge status: Against Medical Advice | Payer: Medicare Other | Attending: Emergency Medicine | Admitting: Emergency Medicine

## 2010-12-10 LAB — WET PREP, GENITAL: Trich, Wet Prep: NONE SEEN

## 2010-12-10 LAB — URINALYSIS, ROUTINE W REFLEX MICROSCOPIC
Bilirubin Urine: NEGATIVE
Nitrite: NEGATIVE
Specific Gravity, Urine: 1.013
Urobilinogen, UA: 0.2

## 2010-12-10 LAB — GC/CHLAMYDIA PROBE AMP, GENITAL
Chlamydia, DNA Probe: NEGATIVE
GC Probe Amp, Genital: POSITIVE — AB

## 2010-12-10 LAB — URINE MICROSCOPIC-ADD ON

## 2010-12-12 LAB — POCT URINALYSIS DIP (DEVICE)
Bilirubin Urine: NEGATIVE
Ketones, ur: NEGATIVE
pH: 5.5

## 2010-12-12 LAB — POCT PREGNANCY, URINE: Preg Test, Ur: NEGATIVE

## 2010-12-19 LAB — CREATININE, SERUM
Creatinine, Ser: 7.25 — ABNORMAL HIGH
GFR calc non Af Amer: 7 — ABNORMAL LOW

## 2010-12-19 LAB — BUN: BUN: 46 — ABNORMAL HIGH

## 2010-12-22 LAB — URINALYSIS, ROUTINE W REFLEX MICROSCOPIC
Ketones, ur: NEGATIVE
Leukocytes, UA: NEGATIVE
Nitrite: NEGATIVE
Protein, ur: 100 — AB
Urobilinogen, UA: 0.2

## 2010-12-22 LAB — COMPREHENSIVE METABOLIC PANEL
Albumin: 3.5
BUN: 48 — ABNORMAL HIGH
Creatinine, Ser: 7.19 — ABNORMAL HIGH
Total Bilirubin: 0.3
Total Protein: 7.3

## 2010-12-22 LAB — DRUGS OF ABUSE SCREEN W/O ALC, ROUTINE URINE
Amphetamine Screen, Ur: NEGATIVE
Cocaine Metabolites: NEGATIVE
Phencyclidine (PCP): NEGATIVE
Propoxyphene: NEGATIVE

## 2010-12-22 LAB — CBC
HCT: 27.7 — ABNORMAL LOW
MCV: 85.3
Platelets: 279
RDW: 14.7

## 2010-12-22 LAB — TSH: TSH: 2.06

## 2010-12-23 ENCOUNTER — Other Ambulatory Visit (HOSPITAL_COMMUNITY): Payer: Self-pay | Admitting: Obstetrics

## 2010-12-23 DIAGNOSIS — O2690 Pregnancy related conditions, unspecified, unspecified trimester: Secondary | ICD-10-CM

## 2010-12-23 DIAGNOSIS — Z3682 Encounter for antenatal screening for nuchal translucency: Secondary | ICD-10-CM

## 2010-12-29 ENCOUNTER — Emergency Department (HOSPITAL_COMMUNITY)
Admission: EM | Admit: 2010-12-29 | Discharge: 2010-12-29 | Disposition: A | Discharge status: Home / Self Care | Payer: Medicare Other | Attending: Emergency Medicine | Admitting: Emergency Medicine

## 2010-12-29 DIAGNOSIS — K219 Gastro-esophageal reflux disease without esophagitis: Secondary | ICD-10-CM | POA: Insufficient documentation

## 2010-12-29 DIAGNOSIS — F3289 Other specified depressive episodes: Secondary | ICD-10-CM | POA: Insufficient documentation

## 2010-12-29 DIAGNOSIS — M545 Low back pain, unspecified: Secondary | ICD-10-CM | POA: Insufficient documentation

## 2010-12-29 DIAGNOSIS — W010XXA Fall on same level from slipping, tripping and stumbling without subsequent striking against object, initial encounter: Secondary | ICD-10-CM | POA: Insufficient documentation

## 2010-12-29 DIAGNOSIS — E789 Disorder of lipoprotein metabolism, unspecified: Secondary | ICD-10-CM | POA: Insufficient documentation

## 2010-12-29 DIAGNOSIS — F329 Major depressive disorder, single episode, unspecified: Secondary | ICD-10-CM | POA: Insufficient documentation

## 2010-12-29 DIAGNOSIS — N186 End stage renal disease: Secondary | ICD-10-CM | POA: Insufficient documentation

## 2010-12-29 DIAGNOSIS — Y92009 Unspecified place in unspecified non-institutional (private) residence as the place of occurrence of the external cause: Secondary | ICD-10-CM | POA: Insufficient documentation

## 2010-12-29 DIAGNOSIS — Z79899 Other long term (current) drug therapy: Secondary | ICD-10-CM | POA: Insufficient documentation

## 2010-12-29 DIAGNOSIS — IMO0002 Reserved for concepts with insufficient information to code with codable children: Secondary | ICD-10-CM | POA: Insufficient documentation

## 2011-01-01 LAB — CBC
HCT: 27.8 — ABNORMAL LOW
Hemoglobin: 9.3 — ABNORMAL LOW
MCHC: 33.6
MCHC: 33.6
MCV: 84.6
Platelets: 309
Platelets: 309
RBC: 3.29 — ABNORMAL LOW
RBC: 3.3 — ABNORMAL LOW
RDW: 14.3 — ABNORMAL HIGH
WBC: 9
WBC: 9.1

## 2011-01-01 LAB — I-STAT 8, (EC8 V) (CONVERTED LAB)
Acid-base deficit: 8 — ABNORMAL HIGH
BUN: 53 — ABNORMAL HIGH
Bicarbonate: 18.7 — ABNORMAL LOW
Chloride: 110
Glucose, Bld: 105 — ABNORMAL HIGH
HCT: 30 — ABNORMAL LOW
Hemoglobin: 10.2 — ABNORMAL LOW
Operator id: 196461
Potassium: 3.5
Sodium: 140
TCO2: 20
pCO2, Ven: 43.3 — ABNORMAL LOW
pH, Ven: 7.244 — ABNORMAL LOW

## 2011-01-01 LAB — COMPREHENSIVE METABOLIC PANEL
ALT: <8
AST: 13
Albumin: 3.4 — ABNORMAL LOW
CO2: 20
Calcium: 7.1 — ABNORMAL LOW
GFR calc Af Amer: 9 — ABNORMAL LOW
GFR calc non Af Amer: 8 — ABNORMAL LOW
Sodium: 137

## 2011-01-01 LAB — URINALYSIS, ROUTINE W REFLEX MICROSCOPIC
Bilirubin Urine: NEGATIVE
Glucose, UA: 100 — AB
Ketones, ur: NEGATIVE
Nitrite: NEGATIVE
pH: 6

## 2011-01-01 LAB — RPR: RPR Ser Ql: NONREACTIVE

## 2011-01-01 LAB — DIFFERENTIAL
Lymphocytes Relative: 34
Lymphs Abs: 3
Monocytes Absolute: 0.5
Monocytes Relative: 6
Neutro Abs: 5.1

## 2011-01-01 LAB — URINE MICROSCOPIC-ADD ON

## 2011-01-01 LAB — POCT I-STAT CREATININE
Creatinine, Ser: 6.8 — ABNORMAL HIGH
Operator id: 196461

## 2011-01-01 LAB — POCT PREGNANCY, URINE: Operator id: 196461

## 2011-01-05 ENCOUNTER — Encounter (HOSPITAL_COMMUNITY): Payer: Self-pay

## 2011-01-05 ENCOUNTER — Ambulatory Visit (HOSPITAL_COMMUNITY): Admission: RE | Admit: 2011-01-05 | Payer: Medicare Other | Source: Ambulatory Visit

## 2011-01-05 ENCOUNTER — Other Ambulatory Visit (HOSPITAL_COMMUNITY): Payer: Self-pay | Admitting: Obstetrics

## 2011-01-05 ENCOUNTER — Ambulatory Visit (HOSPITAL_COMMUNITY)
Admission: RE | Admit: 2011-01-05 | Discharge: 2011-01-05 | Disposition: A | Discharge status: Home / Self Care | Payer: Medicare Other | Source: Ambulatory Visit | Attending: Obstetrics | Admitting: Obstetrics

## 2011-01-05 DIAGNOSIS — O2690 Pregnancy related conditions, unspecified, unspecified trimester: Secondary | ICD-10-CM

## 2011-01-05 DIAGNOSIS — Z3682 Encounter for antenatal screening for nuchal translucency: Secondary | ICD-10-CM

## 2011-01-05 DIAGNOSIS — O3680X0 Pregnancy with inconclusive fetal viability, not applicable or unspecified: Secondary | ICD-10-CM

## 2011-01-05 DIAGNOSIS — O09299 Supervision of pregnancy with other poor reproductive or obstetric history, unspecified trimester: Secondary | ICD-10-CM | POA: Insufficient documentation

## 2011-01-05 DIAGNOSIS — O09529 Supervision of elderly multigravida, unspecified trimester: Secondary | ICD-10-CM | POA: Insufficient documentation

## 2011-01-07 ENCOUNTER — Inpatient Hospital Stay (HOSPITAL_COMMUNITY)
Admission: AD | Admit: 2011-01-07 | Discharge: 2011-01-07 | Disposition: A | Discharge status: Home / Self Care | Payer: Medicare Other | Source: Ambulatory Visit | Attending: Obstetrics | Admitting: Obstetrics

## 2011-01-07 ENCOUNTER — Encounter (HOSPITAL_COMMUNITY): Payer: Self-pay | Admitting: *Deleted

## 2011-01-07 ENCOUNTER — Inpatient Hospital Stay (HOSPITAL_COMMUNITY): Payer: Medicare Other

## 2011-01-07 DIAGNOSIS — Z2989 Encounter for other specified prophylactic measures: Secondary | ICD-10-CM | POA: Insufficient documentation

## 2011-01-07 DIAGNOSIS — Z298 Encounter for other specified prophylactic measures: Secondary | ICD-10-CM | POA: Insufficient documentation

## 2011-01-07 DIAGNOSIS — N186 End stage renal disease: Secondary | ICD-10-CM | POA: Insufficient documentation

## 2011-01-07 DIAGNOSIS — O200 Threatened abortion: Secondary | ICD-10-CM

## 2011-01-07 LAB — CBC
MCHC: 33.3 g/dL (ref 30.0–36.0)
Platelets: 237 10*3/uL (ref 150–400)
RDW: 14.3 % (ref 11.5–15.5)
WBC: 6.7 10*3/uL (ref 4.0–10.5)

## 2011-01-07 LAB — WET PREP, GENITAL
Trich, Wet Prep: NONE SEEN
Yeast Wet Prep HPF POC: NONE SEEN

## 2011-01-07 MED ORDER — OXYCODONE-ACETAMINOPHEN 5-325 MG PO TABS: 2.0000 | ORAL_TABLET | ORAL | Status: AC | PRN

## 2011-01-07 MED ORDER — RHO D IMMUNE GLOBULIN 1500 UNIT/2ML IJ SOLN
300.0000 ug | Freq: Once | INTRAMUSCULAR | Status: AC
  Administered 2011-01-07: 300 ug via INTRAMUSCULAR
  Filled 2011-01-07: qty 2

## 2011-01-07 MED ORDER — OXYCODONE-ACETAMINOPHEN 5-325 MG PO TABS
1.0000 | ORAL_TABLET | Freq: Once | ORAL | Status: AC
  Administered 2011-01-07: 1 via ORAL
  Filled 2011-01-07: qty 1

## 2011-01-07 NOTE — Progress Notes (Signed)
Pt in c/o vaginal bleeding in panties this morning x1 episode, none now.  Reports mid back pain, denies any cramping.  Had BHCG at Dr. Alton Revere office yesterday, unsure of results, is returning for repeat BHCG on Thursday.

## 2011-01-07 NOTE — Progress Notes (Signed)
Patient states she has been seeing Dr. Ruthann Cancer for repeat BHCG's. States Dr. Ruthann Cancer told her the baby may not be alive. Has dark blood on panties when waking this am. Not wearing a pad at this time and no free bleeding. Has had mid back pain since yesterday.

## 2011-01-07 NOTE — ED Provider Notes (Signed)
History     Chief Complaint  Patient presents with  . Vaginal Bleeding   HPI  Desiree [name] 35 y.o. comes with bleeding and back pain.  Saw Dr. Ruthann [name] yesterday and Desiree [name] was [id number].  No FHT seen on ultrasound with a 5.5 cm sac.  Is scheduled for repeat quant on Thursday and repeat ultrasound on Wednesday.  Client is on dialysis for nephrotic syndrome.  Teary and upset.   OB History    Grav Para Term Preterm Abortions TAB SAB Ect Mult Living   4 2 1 1 1 1  0 0 0 1      Past Medical History  Diagnosis Date  . Blood transfusion   . Pregnancy induced hypertension   . Preterm labor   . Chronic kidney disease   . Dialysis patient     Past Surgical History  Procedure Date  . Renal biopsy   . Dilation and curettage of uterus     No family history on file.  History  Substance Use Topics  . Smoking status: Never Smoker   . Smokeless tobacco: Not on file  . Alcohol Use: No    Allergies:  Allergies  Allergen Reactions  . Morphine And Related Other (See Comments)    Patient says she stops breathing  . Prednisone Other (See Comments)    Patient says prednisone causes her to cramp all over    Prescriptions prior to admission  Medication Sig Dispense Refill  . B Complex-C-Folic Acid (RENO CAPS PO) Take 1 capsule by mouth daily.        . calcium acetate (PHOSLO) 667 MG capsule Take 667 mg by mouth 3 (three) times daily with meals.        . Esomeprazole Magnesium (NEXIUM PO) Take by mouth.        . prenatal vitamin w/FE, FA (PRENATAL 1 + 1) 27-1 MG TABS Take 1 tablet by mouth daily.        Marland Kitchen PRENATAL VITAMINS PO Take by mouth.          Review of Systems  Genitourinary:       Vaginal bleeding  Musculoskeletal: Positive for back pain.   Physical Exam   Blood pressure 128/62, pulse 79, temperature 99.2 F (37.3 C), temperature source Oral, resp. rate 20, height 5' 4.5" (1.638 m), weight 202 lb 12.8 oz (91.989 kg), last menstrual period 10/12/2010, SpO2  99.00%.  Physical Exam  Nursing note and vitals reviewed. Constitutional: She is oriented to person, place, and time. She appears well-developed and well-nourished.  HENT:  Head: Normocephalic.  Eyes: EOM are normal.  Neck: Neck supple.  GI: Soft.  Genitourinary:       Speculum exam: Vulva - negative Vagina - Small - mod amount of pink discharge, no odor Cervix - No contact bleeding, no active bleeding seen Bimanual exam: Cervix closed Uterus evaluation limited due to habitus Adnexa non tender GC/Chlam, wet prep done Chaperone present for exam.  Musculoskeletal: Normal range of motion.  Neurological: She is alert and oriented to person, place, and time.  Skin: Skin is warm and dry.  Psychiatric: She has a normal mood and affect.    MAU Course  Procedures Ultrasound today - CRL 5w 6d with absent FHT, while very suspicious for fetal demise, unable to confirm nonviable pregnancy today.  Advised follow up ultrasound and one is scheduled for 01-14-11  Results for orders placed during the hospital encounter of 01/07/11 (from the past 24 hour(s))  RH IG  WORKUP, [place]     Status: Normal (Preliminary result)   Collection Time   01/07/11 12:00 PM      Component Value Range   Gestational Age(Wks) 12     ABO/RH(D) B NEG     Antibody Screen NEG     Unit Number [id number]     Blood Component Type RHIG     Unit division 00     Status of Unit ISSUED     Transfusion Status OK TO TRANSFUSE    CBC     Status: Abnormal   Collection Time   01/07/11 12:00 PM      Component Value Range   WBC 6.7  4.0 - 10.5 (K/uL)   RBC 3.65 (*) 3.87 - 5.11 (MIL/uL)   Hemoglobin 11.7 (*) 12.0 - 15.0 (g/dL)   HCT 35.1 (*) 36.0 - 46.0 (%)   MCV 96.2  78.0 - 100.0 (fL)   MCH 32.1  26.0 - 34.0 (pg)   MCHC 33.3  30.0 - 36.0 (g/dL)   RDW 14.3  11.5 - 15.5 (%)   Platelets 237  150 - 400 (K/uL)  WET PREP, GENITAL     Status: Abnormal   Collection Time   01/07/11  1:25 PM      Component  Value Range   Yeast, Wet Prep NONE SEEN  NONE SEEN    Trich, Wet Prep NONE SEEN  NONE SEEN    Clue Cells, Wet Prep MODERATE (*) NONE SEEN    WBC, Wet Prep HPF POC FEW (*) NONE SEEN     MDM Consult with Dr. Ruthann [name] re: plan of care prior to physical exam Percocet given for pain  - client is on dialysis and has taken percocet previously.  Not a candidate for ibuprofen.  Assessment and Plan  Bleeding in early pregnancy Threatened miscarriage  Plan: Rhophylac given Keep appointment tomorrow for follow up [place] Keep appointment on Wed for follow up ultrasound. If bleeding and cramping worsens, may be having a miscarriage. presribed Percocet for pain as client not able to take ibuprofen - pain choices limited and has taken percocet previously Will not treat vaginal discharge as client is not voicing any problems with discharge.  BURLESON,TERRI 01/07/2011, 11:44 AM   Earlie Server, NP 01/07/11 1419  Earlie Server, NP 01/07/11 [place], NP 01/07/11 1450

## 2011-01-08 LAB — RH IG WORKUP (INCLUDES ABO/RH)
ABO/RH(D): B NEG
Antibody Screen: NEGATIVE
Gestational Age(Wks): 12

## 2011-01-08 LAB — GC/CHLAMYDIA PROBE AMP, GENITAL
Chlamydia, DNA Probe: NEGATIVE
GC Probe Amp, Genital: NEGATIVE

## 2011-01-11 ENCOUNTER — Emergency Department (HOSPITAL_COMMUNITY)
Admission: EM | Admit: 2011-01-11 | Discharge: 2011-01-11 | Disposition: A | Discharge status: Home / Self Care | Payer: Medicare Other | Attending: Emergency Medicine | Admitting: Emergency Medicine

## 2011-01-11 ENCOUNTER — Emergency Department (HOSPITAL_COMMUNITY): Payer: Medicare Other

## 2011-01-11 DIAGNOSIS — Z992 Dependence on renal dialysis: Secondary | ICD-10-CM | POA: Insufficient documentation

## 2011-01-11 DIAGNOSIS — F3289 Other specified depressive episodes: Secondary | ICD-10-CM | POA: Insufficient documentation

## 2011-01-11 DIAGNOSIS — N898 Other specified noninflammatory disorders of vagina: Secondary | ICD-10-CM | POA: Insufficient documentation

## 2011-01-11 DIAGNOSIS — F329 Major depressive disorder, single episode, unspecified: Secondary | ICD-10-CM | POA: Insufficient documentation

## 2011-01-11 DIAGNOSIS — R109 Unspecified abdominal pain: Secondary | ICD-10-CM | POA: Insufficient documentation

## 2011-01-11 DIAGNOSIS — R110 Nausea: Secondary | ICD-10-CM | POA: Insufficient documentation

## 2011-01-11 DIAGNOSIS — N949 Unspecified condition associated with female genital organs and menstrual cycle: Secondary | ICD-10-CM | POA: Insufficient documentation

## 2011-01-11 DIAGNOSIS — E7800 Pure hypercholesterolemia, unspecified: Secondary | ICD-10-CM | POA: Insufficient documentation

## 2011-01-11 DIAGNOSIS — K219 Gastro-esophageal reflux disease without esophagitis: Secondary | ICD-10-CM | POA: Insufficient documentation

## 2011-01-11 DIAGNOSIS — N186 End stage renal disease: Secondary | ICD-10-CM | POA: Insufficient documentation

## 2011-01-11 LAB — DIFFERENTIAL
Basophils Relative: 0 % (ref 0–1)
Lymphs Abs: 3.9 10*3/uL (ref 0.7–4.0)
Monocytes Absolute: 0.8 10*3/uL (ref 0.1–1.0)
Monocytes Relative: 9 % (ref 3–12)
Neutro Abs: 3.4 10*3/uL (ref 1.7–7.7)

## 2011-01-11 LAB — POCT I-STAT, CHEM 8
BUN: 43 mg/dL — ABNORMAL HIGH (ref 6–23)
Calcium, Ion: 1.06 mmol/L — ABNORMAL LOW (ref 1.12–1.32)
Creatinine, Ser: 13.8 mg/dL — ABNORMAL HIGH (ref 0.50–1.10)
Sodium: 135 mEq/L (ref 135–145)
TCO2: 21 mmol/L (ref 0–100)

## 2011-01-11 LAB — CBC
Hemoglobin: 12.1 g/dL (ref 12.0–15.0)
MCH: 31.8 pg (ref 26.0–34.0)
MCHC: 33.6 g/dL (ref 30.0–36.0)
MCV: 94.7 fL (ref 78.0–100.0)

## 2011-01-11 LAB — HCG, QUANTITATIVE, PREGNANCY: hCG, Beta Chain, Quant, S: 676 m[IU]/mL — ABNORMAL HIGH (ref <5)

## 2011-01-14 ENCOUNTER — Ambulatory Visit (HOSPITAL_COMMUNITY): Admission: RE | Admit: 2011-01-14 | Payer: Medicare Other | Source: Ambulatory Visit

## 2011-01-14 ENCOUNTER — Ambulatory Visit (HOSPITAL_COMMUNITY): Payer: Medicare Other

## 2011-01-15 ENCOUNTER — Ambulatory Visit (HOSPITAL_COMMUNITY): Payer: No Typology Code available for payment source

## 2011-04-02 ENCOUNTER — Emergency Department (HOSPITAL_COMMUNITY)
Admission: EM | Admit: 2011-04-02 | Discharge: 2011-04-02 | Disposition: A | Discharge status: Home / Self Care | Payer: Medicare Other | Attending: Emergency Medicine | Admitting: Emergency Medicine

## 2011-04-02 ENCOUNTER — Emergency Department (HOSPITAL_COMMUNITY): Payer: Medicare Other

## 2011-04-02 ENCOUNTER — Encounter (HOSPITAL_COMMUNITY): Payer: Self-pay | Admitting: *Deleted

## 2011-04-02 DIAGNOSIS — W108XXA Fall (on) (from) other stairs and steps, initial encounter: Secondary | ICD-10-CM | POA: Insufficient documentation

## 2011-04-02 DIAGNOSIS — M25539 Pain in unspecified wrist: Secondary | ICD-10-CM | POA: Insufficient documentation

## 2011-04-02 DIAGNOSIS — Z992 Dependence on renal dialysis: Secondary | ICD-10-CM | POA: Insufficient documentation

## 2011-04-02 DIAGNOSIS — M25439 Effusion, unspecified wrist: Secondary | ICD-10-CM | POA: Insufficient documentation

## 2011-04-02 DIAGNOSIS — N186 End stage renal disease: Secondary | ICD-10-CM | POA: Insufficient documentation

## 2011-04-02 DIAGNOSIS — Y92009 Unspecified place in unspecified non-institutional (private) residence as the place of occurrence of the external cause: Secondary | ICD-10-CM | POA: Insufficient documentation

## 2011-04-02 DIAGNOSIS — Z79899 Other long term (current) drug therapy: Secondary | ICD-10-CM | POA: Insufficient documentation

## 2011-04-02 DIAGNOSIS — IMO0001 Reserved for inherently not codable concepts without codable children: Secondary | ICD-10-CM | POA: Insufficient documentation

## 2011-04-02 NOTE — ED Notes (Signed)
Pt reports tripping and falling down stairs yesterday, braced self with both hands. C/o bil wrist pain, worse on R. No swelling deformity noted.

## 2011-04-02 NOTE — ED Provider Notes (Signed)
History     CSN: [id number]  Arrival date & time 04/02/11  1450   First MD Initiated Contact with Patient 04/02/11 1527      Chief Complaint  Patient presents with  . Wrist Pain    (Consider location/radiation/quality/duration/timing/severity/associated sxs/prior treatment) Patient is a 36 y.o. female presenting with wrist pain. The history is provided by the patient.  Wrist Pain This is a new problem. The current episode started yesterday. The problem occurs constantly. The problem has been unchanged. Associated symptoms include myalgias. Pertinent negatives include no joint swelling, numbness, rash or weakness. The symptoms are aggravated by bending. She has tried nothing for the symptoms.   Pt states she tripped and fell down a friend's stairs yesterday afternoon. She fell on her bilateral outstretched hands. She has been experiencing pain to her b/l wrists since then. Pain is worst in R wrist; she has noted some mild swelling to the area. Pain is chiefly over dorsal surface of wrist with occasional radiation into ulnar side of hand. Denies reduction in ROM, numbness, tingling, weakness in hands b/l.  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 AV fistula to left forearm.  Past Medical History  Diagnosis Date  . Blood transfusion   . Pregnancy induced hypertension   . Preterm labor   . Chronic kidney disease   . Dialysis patient     Past Surgical History  Procedure Date  . Renal biopsy   . Dilation and curettage of uterus     No family history on file.  History  Substance Use Topics  . Smoking status: Never Smoker   . Smokeless tobacco: Not on file  . Alcohol Use: No    OB History    Grav Para Term Preterm Abortions TAB SAB Ect Mult Living   4 2 1 1 1 1  0 0 0 1      Review of Systems  Constitutional: Negative.   Musculoskeletal: Positive for myalgias. Negative for joint swelling.  Skin: Negative for rash.  Neurological: Negative for weakness and numbness.     Allergies  Morphine and related and Prednisone  Home Medications   Current Outpatient Rx  Name Route Sig Dispense Refill  . RENO CAPS PO Oral Take 1 capsule by mouth daily.      Marland Kitchen CALCIUM ACETATE 667 MG PO CAPS Oral Take 667 mg by mouth 3 (three) times daily with meals.      Marland Kitchen NEXIUM PO Oral Take 40 mg by mouth daily.       BP 110/61  Pulse 85  Temp(Src) 98.9 F (37.2 C) (Oral)  SpO2 99%  LMP 03/13/2011  Breastfeeding? Unknown  Physical Exam  Nursing note and vitals reviewed. Constitutional: She appears well-developed and well-nourished. No distress.  HENT:  Head: Normocephalic and atraumatic.  Neck: Normal range of motion.  Musculoskeletal:       Right wrist: She exhibits tenderness and swelling. She exhibits normal range of motion, no bony tenderness, no effusion, no crepitus and no deformity.       Left wrist: She exhibits normal range of motion, no tenderness, no bony tenderness, no swelling, no effusion, no crepitus and no deformity.       R wrist: very mild diffuse tenderness and swelling. Neurovasc intact with sensory intact to lt touch in radial, median, and ulnar nerve dist. Cap refill <3. ROM full of wrist but causes pain.  L wrist: no evidence of tenderness or swelling. Neurovasc intact with sensory intact to lt touch in  radial, median, and ulnar nerve dist. Cap refill <3. ROM full of wrist but causes pain.   AV fistula to L forearm just superior to wrist, palp thrill.  Grip strength equal b/l.  Neurological: She is alert.  Skin: Skin is warm and dry. She is not diaphoretic.  Psychiatric: She has a normal mood and affect.    ED Course  Procedures (including critical care time)  Labs Reviewed - No data to display Dg Wrist Complete Left  04/02/2011  *RADIOLOGY REPORT*  Clinical Data: Fall  LEFT WRIST - COMPLETE 3+ VIEW  Comparison: None.  Findings: There is a tiny bony density adjacent to the ulnar styloid.  Tiny avulsion fracture is not excluded.   Unremarkable soft tissues.  IMPRESSION: Tiny avulsion fracture from the ulnar styloid of indeterminate age.  Original Report Authenticated By: Jamas Lav, M.D.   Dg Wrist Complete Right  04/02/2011  *RADIOLOGY REPORT*  Clinical Data: Fall  RIGHT WRIST - COMPLETE 3+ VIEW  Comparison: None.  Findings: No acute fracture and no dislocation.  Unremarkable soft tissues.  IMPRESSION: No acute bony pathology.  Original Report Authenticated By: Jamas Lav, M.D.  Films reviewed by me   1. Wrist pain       MDM  Pt's films reviewed. She has a very, very tiny bony fragment to the L ulnar styloid which may represent avulsion fx. However, the majority of her tenderness, swelling, and pain is to the R wrist. It is possible that this is from an old injury, but advised ortho f/u. Additionally advised RICE and APAP for pain. Return precautions discussed.        Abran Richard, [place] 04/02/11 2307

## 2011-04-03 NOTE — ED Provider Notes (Signed)
Medical screening examination/treatment/procedure(s) were performed by non-physician practitioner and as supervising physician I was immediately available for consultation/collaboration.  Elmer Picker, MD 04/03/11 2208

## 2011-07-15 ENCOUNTER — Other Ambulatory Visit (HOSPITAL_COMMUNITY): Payer: Self-pay | Admitting: Obstetrics and Gynecology

## 2011-07-15 ENCOUNTER — Ambulatory Visit (HOSPITAL_COMMUNITY): Payer: Medicare Other

## 2011-07-15 DIAGNOSIS — O3680X0 Pregnancy with inconclusive fetal viability, not applicable or unspecified: Secondary | ICD-10-CM

## 2011-07-16 ENCOUNTER — Ambulatory Visit (HOSPITAL_COMMUNITY)
Admission: RE | Admit: 2011-07-16 | Discharge: 2011-07-16 | Payer: Medicare Other | Source: Ambulatory Visit | Attending: Obstetrics and Gynecology | Admitting: Obstetrics and Gynecology

## 2011-07-21 ENCOUNTER — Ambulatory Visit (HOSPITAL_COMMUNITY)
Admission: RE | Admit: 2011-07-21 | Discharge: 2011-07-21 | Disposition: A | Discharge status: Home / Self Care | Payer: Medicare Other | Source: Ambulatory Visit | Attending: Obstetrics and Gynecology | Admitting: Obstetrics and Gynecology

## 2011-07-21 ENCOUNTER — Other Ambulatory Visit (HOSPITAL_COMMUNITY): Payer: Self-pay | Admitting: Obstetrics and Gynecology

## 2011-07-21 DIAGNOSIS — O09529 Supervision of elderly multigravida, unspecified trimester: Secondary | ICD-10-CM | POA: Insufficient documentation

## 2011-07-21 DIAGNOSIS — O3680X0 Pregnancy with inconclusive fetal viability, not applicable or unspecified: Secondary | ICD-10-CM

## 2011-07-21 DIAGNOSIS — O26839 Pregnancy related renal disease, unspecified trimester: Secondary | ICD-10-CM | POA: Insufficient documentation

## 2011-08-02 ENCOUNTER — Inpatient Hospital Stay (HOSPITAL_COMMUNITY): Payer: Medicare Other | Admitting: Anesthesiology

## 2011-08-02 ENCOUNTER — Inpatient Hospital Stay (HOSPITAL_COMMUNITY)
Admission: AD | Admit: 2011-08-02 | Discharge: 2011-08-03 | DRG: 770 | Disposition: A | Discharge status: Home / Self Care | Payer: Medicare Other | Source: Ambulatory Visit | Attending: Family Medicine | Admitting: Family Medicine

## 2011-08-02 ENCOUNTER — Encounter (HOSPITAL_COMMUNITY)
Admission: AD | Disposition: A | Discharge status: Home / Self Care | Payer: Self-pay | Source: Ambulatory Visit | Attending: Family Medicine

## 2011-08-02 ENCOUNTER — Encounter (HOSPITAL_COMMUNITY): Payer: Self-pay | Admitting: *Deleted

## 2011-08-02 ENCOUNTER — Inpatient Hospital Stay (HOSPITAL_COMMUNITY): Payer: Medicare Other

## 2011-08-02 ENCOUNTER — Encounter (HOSPITAL_COMMUNITY): Payer: Self-pay | Admitting: Anesthesiology

## 2011-08-02 DIAGNOSIS — D62 Acute posthemorrhagic anemia: Secondary | ICD-10-CM | POA: Diagnosis present

## 2011-08-02 DIAGNOSIS — Z79899 Other long term (current) drug therapy: Secondary | ICD-10-CM

## 2011-08-02 DIAGNOSIS — E876 Hypokalemia: Secondary | ICD-10-CM | POA: Diagnosis not present

## 2011-08-02 DIAGNOSIS — O208 Other hemorrhage in early pregnancy: Secondary | ICD-10-CM

## 2011-08-02 DIAGNOSIS — IMO0002 Reserved for concepts with insufficient information to code with codable children: Secondary | ICD-10-CM | POA: Diagnosis present

## 2011-08-02 DIAGNOSIS — O039 Complete or unspecified spontaneous abortion without complication: Secondary | ICD-10-CM | POA: Diagnosis present

## 2011-08-02 DIAGNOSIS — N2581 Secondary hyperparathyroidism of renal origin: Secondary | ICD-10-CM | POA: Diagnosis present

## 2011-08-02 DIAGNOSIS — I120 Hypertensive chronic kidney disease with stage 5 chronic kidney disease or end stage renal disease: Secondary | ICD-10-CM | POA: Diagnosis present

## 2011-08-02 DIAGNOSIS — N186 End stage renal disease: Secondary | ICD-10-CM | POA: Diagnosis present

## 2011-08-02 DIAGNOSIS — Z992 Dependence on renal dialysis: Secondary | ICD-10-CM

## 2011-08-02 DIAGNOSIS — O021 Missed abortion: Principal | ICD-10-CM | POA: Diagnosis present

## 2011-08-02 HISTORY — PX: DILATION AND EVACUATION: SHX1459

## 2011-08-02 LAB — CBC
HCT: 29.4 % — ABNORMAL LOW (ref 36.0–46.0)
MCH: 31 pg (ref 26.0–34.0)
MCHC: 33 g/dL (ref 30.0–36.0)
MCV: 93.9 fL (ref 78.0–100.0)
RDW: 13.9 % (ref 11.5–15.5)

## 2011-08-02 LAB — COMPREHENSIVE METABOLIC PANEL
Albumin: 3.3 g/dL — ABNORMAL LOW (ref 3.5–5.2)
BUN: 33 mg/dL — ABNORMAL HIGH (ref 6–23)
Calcium: 9.6 mg/dL (ref 8.4–10.5)
Creatinine, Ser: 11.85 mg/dL — ABNORMAL HIGH (ref 0.50–1.10)
GFR calc Af Amer: 4 mL/min — ABNORMAL LOW (ref >90)
Glucose, Bld: 117 mg/dL — ABNORMAL HIGH (ref 70–99)
Total Protein: 6.8 g/dL (ref 6.0–8.3)

## 2011-08-02 SURGERY — DILATION AND EVACUATION, UTERUS

## 2011-08-02 MED ORDER — SILVER NITRATE-POT NITRATE 75-25 % EX MISC
CUTANEOUS | Status: AC
  Filled 2011-08-02: qty 2

## 2011-08-02 MED ORDER — PROMETHAZINE HCL 25 MG/ML IJ SOLN
12.5000 mg | Freq: Once | INTRAMUSCULAR | Status: DC
  Filled 2011-08-02: qty 1

## 2011-08-02 MED ORDER — FENTANYL CITRATE 0.05 MG/ML IJ SOLN
INTRAMUSCULAR | Status: DC | PRN
  Administered 2011-08-02: 50 ug via INTRAVENOUS

## 2011-08-02 MED ORDER — FENTANYL CITRATE 0.05 MG/ML IJ SOLN
25.0000 ug | Freq: Once | INTRAMUSCULAR | Status: AC
  Administered 2011-08-02: 25 ug via INTRAVENOUS
  Filled 2011-08-02: qty 2

## 2011-08-02 MED ORDER — PROMETHAZINE HCL 25 MG/ML IJ SOLN
12.5000 mg | Freq: Once | INTRAMUSCULAR | Status: AC
  Administered 2011-08-02: 12.5 mg via INTRAVENOUS
  Filled 2011-08-02: qty 1

## 2011-08-02 MED ORDER — HYDROMORPHONE HCL PF 1 MG/ML IJ SOLN: 1.0000 mg | Freq: Once | INTRAMUSCULAR | Status: DC

## 2011-08-02 MED ORDER — ONDANSETRON HCL 4 MG/2ML IJ SOLN
INTRAMUSCULAR | Status: AC
  Filled 2011-08-02: qty 2

## 2011-08-02 MED ORDER — FENTANYL CITRATE 0.05 MG/ML IJ SOLN: 25.0000 ug | INTRAMUSCULAR | Status: DC | PRN

## 2011-08-02 MED ORDER — LIDOCAINE IN DEXTROSE 5-7.5 % IV SOLN
INTRAVENOUS | Status: AC
  Filled 2011-08-02: qty 2

## 2011-08-02 MED ORDER — LACTATED RINGERS IV SOLN
INTRAVENOUS | Status: DC
  Administered 2011-08-02: 21:00:00 via INTRAVENOUS

## 2011-08-02 MED ORDER — MIDAZOLAM HCL 2 MG/2ML IJ SOLN
INTRAMUSCULAR | Status: AC
  Filled 2011-08-02: qty 2

## 2011-08-02 MED ORDER — SILVER NITRATE-POT NITRATE 75-25 % EX MISC
CUTANEOUS | Status: DC | PRN
  Administered 2011-08-02: 1

## 2011-08-02 MED ORDER — LACTATED RINGERS IV SOLN
INTRAVENOUS | Status: DC | PRN
  Administered 2011-08-02: 23:00:00 via INTRAVENOUS

## 2011-08-02 MED ORDER — MIDAZOLAM HCL 5 MG/5ML IJ SOLN
INTRAMUSCULAR | Status: DC | PRN
  Administered 2011-08-02: 1 mg via INTRAVENOUS

## 2011-08-02 MED ORDER — CITRIC ACID-SODIUM CITRATE 334-500 MG/5ML PO SOLN
30.0000 mL | Freq: Once | ORAL | Status: AC
  Administered 2011-08-02: 30 mL via ORAL
  Filled 2011-08-02: qty 15

## 2011-08-02 MED ORDER — FENTANYL CITRATE 0.05 MG/ML IJ SOLN
INTRAMUSCULAR | Status: AC
  Filled 2011-08-02: qty 2

## 2011-08-02 MED ORDER — MEPERIDINE HCL 25 MG/ML IJ SOLN
25.0000 mg | Freq: Once | INTRAMUSCULAR | Status: DC
  Filled 2011-08-02: qty 1

## 2011-08-02 MED ORDER — ONDANSETRON HCL 4 MG/2ML IJ SOLN
INTRAMUSCULAR | Status: DC | PRN
  Administered 2011-08-02: 4 mg via INTRAVENOUS

## 2011-08-02 MED ORDER — RHO D IMMUNE GLOBULIN 1500 UNIT/2ML IJ SOLN: 300.0000 ug | Freq: Once | INTRAMUSCULAR | Status: DC

## 2011-08-02 SURGICAL SUPPLY — 14 items
CATH ROBINSON RED A/P 16FR (CATHETERS) ×3 IMPLANT
CLOTH BEACON ORANGE TIMEOUT ST (SAFETY) ×3 IMPLANT
CONTAINER PREFILL 10% NBF 60ML (FORM) ×6 IMPLANT
GLOVE BIO SURGEON STRL SZ 6.5 (GLOVE) ×6 IMPLANT
GOWN PREVENTION PLUS LG XLONG (DISPOSABLE) ×6 IMPLANT
KIT BERKELEY 1ST TRIMESTER 3/8 (MISCELLANEOUS) ×3 IMPLANT
NEEDLE SPNL 22GX3.5 QUINCKE BK (NEEDLE) ×3 IMPLANT
PACK VAGINAL MINOR WOMEN LF (CUSTOM PROCEDURE TRAY) ×3 IMPLANT
PAD PREP 24X48 CUFFED NSTRL (MISCELLANEOUS) ×3 IMPLANT
SET BERKELEY SUCTION TUBING (SUCTIONS) ×3 IMPLANT
SYR CONTROL 10ML LL (SYRINGE) ×3 IMPLANT
TOWEL OR 17X24 6PK STRL BLUE (TOWEL DISPOSABLE) ×6 IMPLANT
VACURETTE 7MM CVD STRL WRAP (CANNULA) ×3 IMPLANT
WATER STERILE IRR 1000ML POUR (IV SOLUTION) ×3 IMPLANT

## 2011-08-02 NOTE — MAU Provider Note (Signed)
History     CSN: [id number]  Arrival date & time 08/02/11  [id number]   First Provider Initiated Contact with Patient 08/02/11 2006      Chief Complaint  Patient presents with  . Vaginal Bleeding    HPI  Past Medical History  Diagnosis Date  . Blood transfusion   . Pregnancy induced hypertension   . Preterm labor   . Chronic kidney disease   . Dialysis patient     Past Surgical History  Procedure Date  . Renal biopsy   . Dilation and curettage of uterus     No family history on file.  History  Substance Use Topics  . Smoking status: Never Smoker   . Smokeless tobacco: Not on file  . Alcohol Use: No    OB History    Grav Para Term Preterm Abortions TAB SAB Ect Mult Living   5 2 1 1 1 1  0 0 0 1      Review of Systems  Allergies  Morphine and related and Prednisone  Home Medications  No current outpatient prescriptions on file.  BP 121/61  Pulse 93  Temp(Src) 98.6 F (37 C) (Oral)  Resp 20  Ht 5\' 5"  (1.651 m)  Wt 86.183 kg (190 lb)  BMI 31.62 kg/m2  SpO2 98%  LMP 10/12/2010  Physical Exam  MAU Course  Procedures (including critical care time)  Labs Reviewed  CBC - Abnormal; Notable for the following:    RBC 3.13 (*)    Hemoglobin 9.7 (*)    HCT 29.4 (*)    All other components within normal limits  COMPREHENSIVE METABOLIC PANEL - Abnormal; Notable for the following:    Sodium 133 (*)    Chloride 94 (*)    Glucose, Bld 117 (*)    BUN 33 (*)    Creatinine, Ser 11.85 (*)    Albumin 3.3 (*)    Alkaline Phosphatase 37 (*)    Total Bilirubin 0.2 (*)    GFR calc non Af Amer 4 (*)    GFR calc Af Amer 4 (*)    All other components within normal limits  TYPE AND SCREEN  PREPARE RBC (CROSSMATCH)  SURGICAL PATHOLOGY   US Ob Transvaginal  08/02/2011  *RADIOLOGY REPORT*  Clinical Data: Heavy bleeding, pain, evaluate for retained products of conception.  TRANSVAGINAL OB ULTRASOUND  Technique:  Transvaginal ultrasound was performed for evaluation of  the gestation as well as the maternal uterus and adnexal regions.  Comparison: 07/21/2011  Findings: No intrauterine gestational sac.  The endometrium is thickened and heterogeneous echogenicity, measuring up to 36 mm in thickness.  There is increased color Doppler flow.  Normal sonographic appearance to the ovaries.  No free fluid.  IMPRESSION: Thickened, heterogeneous endometrium with increased color Doppler flow.  Retained products not excluded.  Original Report Authenticated By: Suanne Marker, M.D.     No diagnosis found.    MDM  Update H&P Pt seen after her [place] and reports persistent cramping and pain.  Bleeding has slowed since tissue passed, but has not stopped.  Hgb 9.7 down from her usual 13.  Pt reports she is not anuric, but only voids 2-3 x daily and requires dialysis M-W-F.  US shows probable retained tissue, c/w her continued pain. D/w pt safest route for her is to proceed with D&C to remove tissue since she has already bled quite heavily.  We discussed risks of bleeding, infection, and uterine perforation.  She desires to proceed.  OR and anesthesia notified.  Pt last ate around 400pm. She has blood typed and crossed due to her heavy bleeding on arrival.  I will d/w hospitalist transfer to [facility] after PACU so her dialysis can be done more efficiently given possible fluid shifts with blood loss and OR fluid.  Lytes are currently acceptable.

## 2011-08-02 NOTE — Progress Notes (Signed)
Patient ID: Desiree [name], female   DOB: 1975-09-05, 36 y.o.   MRN: [id number] Pt is a 36 y/o renal patient who came in with acute hemorrhage related to an incomplete miscarriage.  Hgb down to at least 9.7, but probably lower after time to equilibrate.  Electrolytes were stable on admission.  The patients vital signs were stable throughout her course.  She underwent a D&C for retained products without difficulty under spinal anesthesia.  She is Rh negative and will receive rhogam in the PACU.  I discussed the patient with Dr. Clotilde Dieter of the Hospitalists and she agrees for transfer to [facility] so that the patient may be observed and labs run to adjust her dialysis as needed in the AM.  She usually has her dialysis at the [facility] location, but given the blood loss and possible fluid shifts--we will observe her a little more closely until morning.

## 2011-08-02 NOTE — Op Note (Signed)
Operative note  Preoperative diagnosis Incomplete abortion with hemorrhage   Postoperative diagnosis Same  Procedure Suction dilation and evacuation  Surgeon Dr. Paula Compton  Anesthesia Spinal  Fluids 800 cc LR Urine output approximately 50 cc straight catheter prior to procedure Blood loss 50 cc  Findings The uterus sounded to approximately 8 cm. A moderate amount of retained products of conception were obtained with suction curettage.  Specimen Products of conception were sent to pathology  Procedure note Patient was taken to the OR after informed consent was obtained and spinal anesthesia was obtained without difficulty. The patient was prepped and draped in thel normal sterile fashion in the dorsal lithotomy position an appropriate time out was performed. A speculum was placed in within the vagina and the cervix noted to be partially dilated with small clots in the vaginal fornix. A single-tooth tenaculum was placed on the anterior lip and the uterus easily sounded to 8 cm. The Pratt dilators to also easily passed up to size 21.  A 7 mm suction curette was then easily introduced into the uterine fundus and multiple passes a moderate amount of products of conception were obtained. When no further tissue was forthcoming suction was discontinued and a sharp curettage performed with no further tissue felt in the uterine fundus. 2 additional passes of suction were made with no further tissue obtained. There is no significant bleeding noted and the tenaculum was removed from the anterior lip. Small amount of bleeding at the tenaculum site was treated with silver nitrate. At this point all instrument and sponge counts were correct and the  speculum was removed from the patient's vagina.  She was then taken to the recovery room comfortable and in good condition.  Patient will be observed in the PACU until her spinal is resolving and then be transferred to the MedSurg unit at [facility]  for dialysis in the morning.

## 2011-08-02 NOTE — MAU Note (Signed)
Pt presents with heavy vaginal bleeding, feels weak, taken per wheelchair to room 5, states she is scheduled for a D&C this week but had sudden onset of heavy bleeding and pain about 1 hour ago. Pt reports she is a renal dialysis pt and is afraid her blood count may be low.

## 2011-08-02 NOTE — Anesthesia Procedure Notes (Signed)
Spinal  Patient location during procedure: OR Preanesthetic Checklist Completed: patient identified, site marked, surgical consent, pre-op evaluation, timeout performed, IV checked, risks and benefits discussed and monitors and equipment checked Spinal Block Patient position: sitting Prep: DuraPrep Patient monitoring: heart rate, cardiac monitor, continuous pulse ox and blood pressure Approach: midline Location: L3-4 Injection technique: single-shot Needle Needle type: Sprotte  Needle gauge: 24 G Needle length: 9 cm Assessment Sensory level: T4 Additional Notes Spinal Dosage in OR  Xyloicaine ml       1.2

## 2011-08-02 NOTE — MAU Provider Note (Signed)
History     CSN: [id number]  Arrival date and time: 08/02/11 [id number]   First Provider Initiated Contact with Patient 08/02/11 2006      Chief Complaint  Patient presents with  . Vaginal Bleeding   HPI  [id number] @ 14 wks in with heavy bleeding abd pain and cramping. Hx of nephrotic syndrome and is on dialysis.  OB History    Grav Para Term Preterm Abortions TAB SAB Ect Mult Living   5 2 1 1 1 1  0 0 0 1      Past Medical History  Diagnosis Date  . Blood transfusion   . Pregnancy induced hypertension   . Preterm labor   . Chronic kidney disease   . Dialysis patient     Past Surgical History  Procedure Date  . Renal biopsy   . Dilation and curettage of uterus     No family history on file.  History  Substance Use Topics  . Smoking status: Never Smoker   . Smokeless tobacco: Not on file  . Alcohol Use: No    Allergies:  Allergies  Allergen Reactions  . Morphine And Related Other (See Comments)    Patient says she stops breathing Can take percocet  . Prednisone Other (See Comments)    Patient says prednisone causes her to cramp all over    Prescriptions prior to admission  Medication Sig Dispense Refill  . B Complex-C-Folic Acid ([place] CAPS PO) Take 1 capsule by mouth daily.        . calcium acetate (PHOSLO) 667 MG capsule Take 667 mg by mouth 3 (three) times daily with meals.        . Esomeprazole Magnesium (NEXIUM PO) Take 40 mg by mouth daily.         Review of Systems  Constitutional: Negative.   HENT: Negative.   Eyes: Negative.   Respiratory: Negative.   Cardiovascular: Negative.   Gastrointestinal: Positive for nausea, vomiting and abdominal pain.  Genitourinary:       Vag bleeding with clots  Skin: Negative.   Neurological: Negative.   Endo/Heme/Allergies: Negative.   Psychiatric/Behavioral: Negative.    Physical Exam   Blood pressure 121/61, pulse 93, temperature 98.6 F (37 C), temperature source Oral, resp. rate 20, last menstrual  period 10/12/2010, SpO2 98.00%.  Physical Exam  Constitutional: She is oriented to person, place, and time. She appears well-developed and well-nourished.  HENT:  Head: Normocephalic.  Neck: Normal range of motion.  Cardiovascular: Normal rate, regular rhythm, normal heart sounds and intact distal pulses.   Respiratory: Effort normal and breath sounds normal.  GI: Soft. Bowel sounds are normal.  Genitourinary:       lg amt bleedin with clots.  Musculoskeletal: Normal range of motion.  Neurological: She is alert and oriented to person, place, and time. She has normal reflexes.  Skin: Skin is warm and dry.  Psychiatric: She has a normal mood and affect. Her behavior is normal. Judgment and thought content normal.   Talked with Dr. Marvel Plan regarding POC. Will get labs.   MAU Course  Procedures  MDM Results for orders placed during the hospital encounter of 08/02/11 (from the past 24 hour(s))  TYPE AND SCREEN     Status: Normal (Preliminary result)   Collection Time   08/02/11  8:00 PM      Component Value Range   ABO/RH(D) B NEG     Antibody Screen NEG     Sample Expiration 08/05/2011  Unit Number [id number]     Blood Component Type RBC LR PHER1     Unit division 00     Status of Unit ALLOCATED     Transfusion Status OK TO TRANSFUSE     Crossmatch Result Compatible     Unit Number [id number]     Blood Component Type RBC LR PHER1     Unit division 00     Status of Unit ALLOCATED     Transfusion Status OK TO TRANSFUSE     Crossmatch Result Compatible    CBC     Status: Abnormal   Collection Time   08/02/11  8:04 PM      Component Value Range   WBC 9.2  4.0 - 10.5 (K/uL)   RBC 3.13 (*) 3.87 - 5.11 (MIL/uL)   Hemoglobin 9.7 (*) 12.0 - 15.0 (g/dL)   HCT 29.4 (*) 36.0 - 46.0 (%)   MCV 93.9  78.0 - 100.0 (fL)   MCH 31.0  26.0 - 34.0 (pg)   MCHC 33.0  30.0 - 36.0 (g/dL)   RDW 13.9  11.5 - 15.5 (%)   Platelets 219  150 - 400 (K/uL)  COMPREHENSIVE METABOLIC PANEL      Status: Abnormal   Collection Time   08/02/11  8:04 PM      Component Value Range   Sodium 133 (*) 135 - 145 (mEq/L)   Potassium 3.6  3.5 - 5.1 (mEq/L)   Chloride 94 (*) 96 - 112 (mEq/L)   CO2 24  19 - 32 (mEq/L)   Glucose, Bld 117 (*) 70 - 99 (mg/dL)   BUN 33 (*) 6 - 23 (mg/dL)   Creatinine, Ser 11.85 (*) 0.50 - 1.10 (mg/dL)   Calcium 9.6  8.4 - 10.5 (mg/dL)   Total Protein 6.8  6.0 - 8.3 (g/dL)   Albumin 3.3 (*) 3.5 - 5.2 (g/dL)   AST 10  0 - 37 (U/L)   ALT 8  0 - 35 (U/L)   Alkaline Phosphatase 37 (*) 39 - 117 (U/L)   Total Bilirubin 0.2 (*) 0.3 - 1.2 (mg/dL)   GFR calc non Af Amer 4 (*) >90 (mL/min)   GFR calc Af Amer 4 (*) >90 (mL/min)  PREPARE RBC (CROSSMATCH)     Status: Normal   Collection Time   08/02/11  9:00 PM      Component Value Range   Order Confirmation ORDER PROCESSED BY BLOOD BANK     Clinical Data: Heavy bleeding, pain, evaluate for retained products  of conception.  TRANSVAGINAL OB ULTRASOUND  Technique: Transvaginal ultrasound was performed for evaluation of  the gestation as well as the maternal uterus and adnexal regions.  Comparison: 07/21/2011  Findings: No intrauterine gestational sac. The endometrium is  thickened and heterogeneous echogenicity, measuring up to 36 mm in  thickness. There is increased color Doppler flow.  Normal sonographic appearance to the ovaries. No free fluid.  IMPRESSION:  Thickened, heterogeneous endometrium with increased color Doppler  flow. Retained products not excluded   Assessment and Plan  Sterile spec exam done tissue removed from os and sent to path, will get [place] and give rho gam. Further decision will be made on u/s results.   2150 - client still having pain in right leg, lower abdomen and lower back.  Bleeding is small amount at present.  Dr. Marvel Plan notified and she will come see client.    LAWSON,MARIE DARLENE 08/02/2011, 8:06 PM

## 2011-08-02 NOTE — Transfer of Care (Signed)
Immediate Anesthesia Transfer of Care Note  Patient: Desiree [name]  Procedure(s) Performed: Procedure(s) (LRB): DILATATION AND EVACUATION ()  Patient Location: PACU  Anesthesia Type: Spinal  Level of Consciousness: awake, alert  and oriented  Airway & Oxygen Therapy: Patient Spontanous Breathing  Post-op Assessment: Report given to PACU RN and Post -op Vital signs reviewed and stable  Post vital signs: stable  Complications: No apparent anesthesia complications

## 2011-08-02 NOTE — Anesthesia Preprocedure Evaluation (Signed)
Anesthesia Evaluation  Patient identified by MRN, date of birth, ID band Patient awake    Reviewed: Allergy & Precautions, H&P , Patient's Chart, lab work & pertinent test results  Airway Mallampati: II TM Distance: >3 FB Neck ROM: full    Dental No notable dental hx.    Pulmonary  breath sounds clear to auscultation  Pulmonary exam normal       Cardiovascular Exercise Tolerance: Good Rhythm:regular Rate:Normal     Neuro/Psych    GI/Hepatic   Endo/Other    Renal/GU CRFRenal disease     Musculoskeletal   Abdominal   Peds  Hematology  (+) anemia ,   Anesthesia Other Findings Dialysis tomorrow  Reproductive/Obstetrics                           Anesthesia Physical Anesthesia Plan  ASA: III  Anesthesia Plan: Spinal   Post-op Pain Management:    Induction:   Airway Management Planned:   Additional Equipment:   Intra-op Plan:   Post-operative Plan:   Informed Consent: I have reviewed the patients History and Physical, chart, labs and discussed the procedure including the risks, benefits and alternatives for the proposed anesthesia with the patient or authorized representative who has indicated his/her understanding and acceptance.   Dental Advisory Given  Plan Discussed with: CRNA  Anesthesia Plan Comments: (Lab work confirmed with CRNA in room. Platelets okay. Discussed spinal anesthetic, and patient consents to the procedure:  included risk of possible headache,backache, failed block, allergic reaction, and nerve injury. This patient was asked if she had any questions or concerns before the procedure started. )        Anesthesia Quick Evaluation

## 2011-08-02 NOTE — OB Triage Note (Cosign Needed)
Pt now has minimal bleeding vaginally.

## 2011-08-02 NOTE — Brief Op Note (Signed)
08/02/2011  11:37 PM  PATIENT:  Desiree [name]  36 y.o. female  PRE-OPERATIVE DIAGNOSIS:  Incomplete Abortion  POST-OPERATIVE DIAGNOSIS:  Incomplete Abortion  PROCEDURE:  Procedure(s) (LRB): DILATATION AND EVACUATION ()  SURGEON:  Surgeon(s) and Role:    * Logan Bores, MD - Primary   ANESTHESIA:   spinal  EBL:  Total I/O In: 50 [I.V.:50] Out: 50 [Blood:50]  BLOOD ADMINISTERED:none  DRAINS: none    SPECIMEN: Moderate amount of POC's DISPOSITION OF SPECIMEN:  path  COUNTS:  YES  DICTATION: .Dragon Dictation  PLAN OF CARE: Transfer to [facility]  PATIENT DISPOSITION:  PACU - hemodynamically stable.

## 2011-08-03 ENCOUNTER — Encounter (HOSPITAL_COMMUNITY): Payer: Self-pay | Admitting: *Deleted

## 2011-08-03 ENCOUNTER — Inpatient Hospital Stay (HOSPITAL_COMMUNITY): Payer: Medicare Other

## 2011-08-03 ENCOUNTER — Inpatient Hospital Stay (HOSPITAL_COMMUNITY): Admission: AD | Admit: 2011-08-03 | Payer: Medicare Other | Source: Ambulatory Visit | Admitting: *Deleted

## 2011-08-03 DIAGNOSIS — E876 Hypokalemia: Secondary | ICD-10-CM | POA: Diagnosis not present

## 2011-08-03 DIAGNOSIS — D62 Acute posthemorrhagic anemia: Secondary | ICD-10-CM | POA: Insufficient documentation

## 2011-08-03 DIAGNOSIS — O039 Complete or unspecified spontaneous abortion without complication: Secondary | ICD-10-CM | POA: Diagnosis present

## 2011-08-03 DIAGNOSIS — N186 End stage renal disease: Secondary | ICD-10-CM

## 2011-08-03 DIAGNOSIS — O021 Missed abortion: Secondary | ICD-10-CM

## 2011-08-03 DIAGNOSIS — R112 Nausea with vomiting, unspecified: Secondary | ICD-10-CM

## 2011-08-03 LAB — CBC
MCH: 31.6 pg (ref 26.0–34.0)
MCV: 93 fL (ref 78.0–100.0)
Platelets: 155 10*3/uL (ref 150–400)
RBC: 2.44 MIL/uL — ABNORMAL LOW (ref 3.87–5.11)
RDW: 13.9 % (ref 11.5–15.5)
WBC: 10.6 10*3/uL — ABNORMAL HIGH (ref 4.0–10.5)

## 2011-08-03 LAB — BASIC METABOLIC PANEL
CO2: 27 mEq/L (ref 19–32)
Calcium: 9.1 mg/dL (ref 8.4–10.5)
Creatinine, Ser: 12.78 mg/dL — ABNORMAL HIGH (ref 0.50–1.10)
GFR calc non Af Amer: 3 mL/min — ABNORMAL LOW (ref >90)
Sodium: 136 mEq/L (ref 135–145)

## 2011-08-03 LAB — HEMOGLOBIN AND HEMATOCRIT, BLOOD: Hemoglobin: 7.8 g/dL — ABNORMAL LOW (ref 12.0–15.0)

## 2011-08-03 MED ORDER — CALCIUM ACETATE 667 MG PO CAPS
667.0000 mg | ORAL_CAPSULE | Freq: Three times a day (TID) | ORAL | Status: DC
  Administered 2011-08-03 (×2): 667 mg via ORAL
  Filled 2011-08-03 (×5): qty 1

## 2011-08-03 MED ORDER — SODIUM CHLORIDE 0.9 % IJ SOLN: 3.0000 mL | Freq: Two times a day (BID) | INTRAMUSCULAR | Status: DC

## 2011-08-03 MED ORDER — PANTOPRAZOLE SODIUM 40 MG PO TBEC
40.0000 mg | DELAYED_RELEASE_TABLET | Freq: Every day | ORAL | Status: DC
  Administered 2011-08-03: 40 mg via ORAL
  Filled 2011-08-03: qty 1

## 2011-08-03 MED ORDER — PARICALCITOL 5 MCG/ML IV SOLN
INTRAVENOUS | Status: AC
  Administered 2011-08-03: 2 ug via INTRAVENOUS
  Filled 2011-08-03: qty 1

## 2011-08-03 MED ORDER — POTASSIUM CHLORIDE CRYS ER 20 MEQ PO TBCR
EXTENDED_RELEASE_TABLET | ORAL | Status: AC
  Filled 2011-08-03: qty 2

## 2011-08-03 MED ORDER — CALCIUM ACETATE 667 MG PO CAPS
667.0000 mg | ORAL_CAPSULE | Freq: Three times a day (TID) | ORAL | Status: DC
  Filled 2011-08-03 (×5): qty 1

## 2011-08-03 MED ORDER — OXYCODONE-ACETAMINOPHEN 5-325 MG PO TABS
1.0000 | ORAL_TABLET | ORAL | Status: DC | PRN
  Administered 2011-08-03 (×2): 1 via ORAL
  Filled 2011-08-03 (×2): qty 1

## 2011-08-03 MED ORDER — SODIUM CHLORIDE 0.9 % IV SOLN: 250.0000 mL | INTRAVENOUS | Status: DC | PRN

## 2011-08-03 MED ORDER — PARICALCITOL 5 MCG/ML IV SOLN
2.0000 ug | INTRAVENOUS | Status: DC
  Administered 2011-08-03: 2 ug via INTRAVENOUS
  Filled 2011-08-03: qty 0.4

## 2011-08-03 MED ORDER — ACETAMINOPHEN 650 MG RE SUPP: 650.0000 mg | Freq: Four times a day (QID) | RECTAL | Status: DC | PRN

## 2011-08-03 MED ORDER — POTASSIUM CHLORIDE CRYS ER 20 MEQ PO TBCR
40.0000 meq | EXTENDED_RELEASE_TABLET | Freq: Once | ORAL | Status: AC
  Administered 2011-08-03: 40 meq via ORAL
  Filled 2011-08-03: qty 2

## 2011-08-03 MED ORDER — ONDANSETRON HCL 4 MG/2ML IJ SOLN: 4.0000 mg | Freq: Four times a day (QID) | INTRAMUSCULAR | Status: DC | PRN

## 2011-08-03 MED ORDER — ACETAMINOPHEN 325 MG PO TABS
650.0000 mg | ORAL_TABLET | Freq: Four times a day (QID) | ORAL | Status: DC | PRN
  Administered 2011-08-03: 650 mg via ORAL
  Filled 2011-08-03: qty 2

## 2011-08-03 MED ORDER — DARBEPOETIN ALFA-POLYSORBATE 150 MCG/0.3ML IJ SOLN
150.0000 ug | INTRAMUSCULAR | Status: DC
  Administered 2011-08-03: 150 ug via INTRAVENOUS
  Filled 2011-08-03: qty 0.3

## 2011-08-03 MED ORDER — DARBEPOETIN ALFA-POLYSORBATE 150 MCG/0.3ML IJ SOLN
INTRAMUSCULAR | Status: AC
  Administered 2011-08-03: 150 ug via INTRAVENOUS
  Filled 2011-08-03: qty 0.3

## 2011-08-03 MED ORDER — RHO D IMMUNE GLOBULIN 1500 UNIT/2ML IJ SOLN
300.0000 ug | Freq: Once | INTRAMUSCULAR | Status: AC
  Administered 2011-08-03: 300 ug via INTRAMUSCULAR
  Filled 2011-08-03: qty 2

## 2011-08-03 MED ORDER — SODIUM CHLORIDE 0.9 % IJ SOLN: 3.0000 mL | INTRAMUSCULAR | Status: DC | PRN

## 2011-08-03 MED ORDER — CALCIUM ACETATE 667 MG PO CAPS
2668.0000 mg | ORAL_CAPSULE | Freq: Three times a day (TID) | ORAL | Status: DC
  Administered 2011-08-03: 2668 mg via ORAL
  Filled 2011-08-03 (×4): qty 4

## 2011-08-03 MED ORDER — ONDANSETRON HCL 4 MG PO TABS: 4.0000 mg | ORAL_TABLET | Freq: Four times a day (QID) | ORAL | Status: DC | PRN

## 2011-08-03 NOTE — Consult Note (Signed)
[facility] Renal Consultation Note  Indication for Consultation:  Management of ESRD/hemodialysis; anemia, hypertension/volume and secondary hyperparathyroidism  HPI: Desiree [name] is a 36 y.o. female admitted after she presented with acute hemorrhage related to incomplete miscarriage. She underwent D&C last night at [facility] and was transferred to [facility] because of her history of end-stage renal disease, blood loss and volume given intraoperatively. Now on Hemodialysis feeling slightly stronger. Of note her Hgb was 11.1 on out patient labs5/17/13 and last night 08/02/11 9.7 ,7.7 hgb this am and today pre HD hgb pending. ALSO she is active on [facility] lisiting and if able trying to avoid transfusion. No history of CAD.      Past Medical History  Diagnosis Date  . Blood transfusion   . Pregnancy induced hypertension   . Preterm labor   . Chronic kidney disease   . Dialysis patient     Past Surgical History  Procedure Date  . Renal biopsy   . Dilation and curettage of uterus   . Dilation and evacuation 08/02/2011    Procedure: DILATATION AND EVACUATION;  Surgeon: Logan Bores, MD;  Location: [facility];  Service: Gynecology;;      Family History  Problem Relation Age of Onset  . Hypertension    . Diabetes type II        reports that she has never smoked. She does not have any smokeless tobacco history on file. She reports that she uses illicit drugs (Marijuana) about 7 times per week. She reports that she does not drink alcohol.   Allergies  Allergen Reactions  . Morphine And Related Other (See Comments)    Patient says she stops breathing Can take percocet  . Prednisone Other (See Comments)    Patient says prednisone causes her to cramp all over    Prior to Admission medications   Medication Sig Start Date End Date Taking? Authorizing Provider  calcium acetate (PHOSLO) 667 MG capsule Take 667 mg by mouth 3  (three) times daily with meals.     Yes Historical Provider, MD  Esomeprazole Magnesium (NEXIUM PO) Take 40 mg by mouth daily.    Yes Historical Provider, MD    [id number] chloride, acetaminophen, acetaminophen, ondansetron (ZOFRAN) IV, ondansetron, oxyCODONE-acetaminophen, sodium chloride, DISCONTD: fentaNYL, DISCONTD: silver nitrate applicators  Results for orders placed during the hospital encounter of 08/02/11 (from the past 48 hour(s))  TYPE AND SCREEN     Status: Normal (Preliminary result)   Collection Time   08/02/11  8:00 PM      Component Value Range Comment   ABO/RH(D) B NEG      Antibody Screen NEG      Sample Expiration 08/05/2011      Unit Number [id number]      Blood Component Type RBC LR PHER1      Unit division 00      Status of Unit ALLOCATED      Transfusion Status OK TO TRANSFUSE      Crossmatch Result Compatible      Unit Number [id number]      Blood Component Type RBC LR PHER1      Unit division 00      Status of Unit ALLOCATED      Transfusion Status OK TO TRANSFUSE      Crossmatch Result Compatible     CBC     Status: Abnormal   Collection Time   08/02/11  8:04 PM  Component Value Range Comment   WBC 9.2  4.0 - 10.5 (K/uL)    RBC 3.13 (*) 3.87 - 5.11 (MIL/uL)    Hemoglobin 9.7 (*) 12.0 - 15.0 (g/dL)    HCT 29.4 (*) 36.0 - 46.0 (%)    MCV 93.9  78.0 - 100.0 (fL)    MCH 31.0  26.0 - 34.0 (pg)    MCHC 33.0  30.0 - 36.0 (g/dL)    RDW 13.9  11.5 - 15.5 (%)    Platelets 219  150 - 400 (K/uL)   COMPREHENSIVE METABOLIC PANEL     Status: Abnormal   Collection Time   08/02/11  8:04 PM      Component Value Range Comment   Sodium 133 (*) 135 - 145 (mEq/L)    Potassium 3.6  3.5 - 5.1 (mEq/L)    Chloride 94 (*) 96 - 112 (mEq/L)    CO2 24  19 - 32 (mEq/L)    Glucose, Bld 117 (*) 70 - 99 (mg/dL)    BUN 33 (*) 6 - 23 (mg/dL)    Creatinine, Ser 11.85 (*) 0.50 - 1.10 (mg/dL)    Calcium 9.6  8.4 - 10.5 (mg/dL)    Total Protein 6.8  6.0 - 8.3 (g/dL)    Albumin 3.3  (*) 3.5 - 5.2 (g/dL)    AST 10  0 - 37 (U/L)    ALT 8  0 - 35 (U/L)    Alkaline Phosphatase 37 (*) 39 - 117 (U/L)    Total Bilirubin 0.2 (*) 0.3 - 1.2 (mg/dL)    GFR calc non Af Amer 4 (*) >90 (mL/min)    GFR calc Af Amer 4 (*) >90 (mL/min)   PREPARE RBC (CROSSMATCH)     Status: Normal   Collection Time   08/02/11  9:00 PM      Component Value Range Comment   Order Confirmation ORDER PROCESSED BY BLOOD BANK     RH IG WORKUP (INCLUDES ABO/RH)     Status: Normal (Preliminary result)   Collection Time   08/03/11 12:23 AM      Component Value Range Comment   Gestational Age(Wks) 13      ABO/RH(D) B NEG      Unit Number [id number]      Blood Component Type RHIG      Unit division 00      Status of Unit ISSUED      Transfusion Status OK TO TRANSFUSE     BASIC METABOLIC PANEL     Status: Abnormal   Collection Time   08/03/11  6:30 AM      Component Value Range Comment   Sodium 136  135 - 145 (mEq/L)    Potassium 3.3 (*) 3.5 - 5.1 (mEq/L)    Chloride 96  96 - 112 (mEq/L)    CO2 27  19 - 32 (mEq/L)    Glucose, Bld 122 (*) 70 - 99 (mg/dL)    BUN 40 (*) 6 - 23 (mg/dL)    Creatinine, Ser 12.78 (*) 0.50 - 1.10 (mg/dL)    Calcium 9.1  8.4 - 10.5 (mg/dL)    GFR calc non Af Amer 3 (*) >90 (mL/min)    GFR calc Af Amer 4 (*) >90 (mL/min)   CBC     Status: Abnormal   Collection Time   08/03/11  6:30 AM      Component Value Range Comment   WBC 10.6 (*) 4.0 - 10.5 (K/uL)    RBC 2.44 (*)  3.87 - 5.11 (MIL/uL)    Hemoglobin 7.7 (*) 12.0 - 15.0 (g/dL)    HCT 22.7 (*) 36.0 - 46.0 (%)    MCV 93.0  78.0 - 100.0 (fL)    MCH 31.6  26.0 - 34.0 (pg)    MCHC 33.9  30.0 - 36.0 (g/dL)    RDW 13.9  11.5 - 15.5 (%)    Platelets 155  150 - 400 (K/uL)    EKG: normal EKG, normal sinus rhythm, unchanged from previous tracings, occasional PVC noted, unifocal.  ROS:  History obtained from pt.= No chills , fevers, chest pains, melena,or sob.  Physical Exam: Filed Vitals:   08/03/11 1415  BP: 114/57    Pulse: 91  Temp:   Resp:      General: Alert young BF NAD, pleasant and appropriate HEENT: [place], nonicteric,mmm Neck: supple, no jvd Heart: RRR,soft 1/6 sem lsb, no rub Lungs:  CTA bilaterally , no rales or wheezing Abdomen: Obese, soft, nontender Extremities:  No pedal edema Skin: warm dry, no rash seen Neuro:  Ox3, no acute focal deficits Dialysis Access: left fa avf patent on hd  Dialysis Orders: [facility]: [facility] . EDW 91.5 kg and tapering down HD Bath 2k, 2.25 ca  Time 3hrs 45 min Heparin standard. Access l fa avf BFR 400 DFR 800    Zemplar 2 mcg IV/HD Epogen 1200   Units IV/HD  Venofer  0  Other 0  Assessment/Plan      1. Acute blood loss anemia: Secondary to incomplete abortion= fu with gyn as planned/ note plans for dc home if stable post hd. 2. ESRD = op at [facility]=  No heparin hd today and added k bath until reck k is back/ with k 3.6 prior 3.3 given po replacement 3. Hypertension/volume  - bp= 114/57/ volume okay/ minimal wt .off with hd/ no outpatient meds listed at kidney center 4. Anemia  - hgb 7.7, increase EPO as op and give  Aranesp 133mcg here today. No op iron. 5. Metabolic bone disease -  Iv Zemplar on hd and Sensipar and Phoslo  Binders  With meals 6. Nutrition -  High protein renal diet  Ernest Haber, PA-C [place] [phone] 08/03/2011, 2:46 PM   Patient seen and examined and agree with assessment and plan as above.  Kelly Splinter  MD [facility] [phone] pgr    [phone] cell 08/03/2011, 5:31 PM

## 2011-08-03 NOTE — Discharge Summary (Addendum)
Physician Discharge Summary  Desiree [name] [id number] DOB: 11/17/1975 DOA: 08/02/2011  PCP: Desiree Kalata, MD, MD  Admit date: 08/02/2011 Discharge date: 08/03/2011  Recommendations for Outpatient Follow-up:  1. Followup with your OB as recommended in 2 weeks for recent incomplete abortion. 2. Repeat CBC with next hemodialysis treatment.  Follow-up Information    Follow up with Desiree Bores, MD. Schedule an appointment as soon as possible for a visit in 2 weeks.   Contact information:   [address] [place] [phone]         Discharge Diagnoses:  1. Acute blood loss anemia 2. Incomplete abortion with hemorrhage, status post dilatation and evacuation 3. End-stage renal disease  Discharge Condition: Improved Disposition: Home  Diet recommendation: Renal diet  History of present illness:  36 year old woman presented with acute hemorrhage related to incomplete miscarriage. She underwent D&C at [facility] and was transferred to [facility] because of her history of end-stage renal disease, blood loss and volume given intraoperatively.  Hospital Course:  Desiree [name] was admitted to the medical floor. She remained clinically stable and underwent routine hemodialysis. She had no further bleeding and her repeat hemoglobin was stable. 1. Acute blood loss anemia: Secondary to incomplete abortion. Bleeding has stopped. Aranesp per nephrology. No iron per nephrology.  2. Incomplete abortion with hemorrhage, status post dilatation and evacuation May 19.  3. Hypokalemia: Replete.  4. End-stage renal disease: Routine hemodialysis today. Nephrology consulted.  5. Reported marijuana use  Consultants:  Nephrology  Procedures:  08/02/2011: Dilatation and evacuation   Routine hemodialysis  Discharge Instructions  Discharge Orders    Future Orders Please Complete By Expires   Discharge instructions      Comments:   Call your physician for recurrent bleeding.   Activity as tolerated - No restrictions        Medication List  As of 08/03/2011  6:22 PM   TAKE these medications         calcium acetate 667 MG capsule   Commonly known as: PHOSLO   Take 667 mg by mouth 3 (three) times daily with meals.      NEXIUM PO   Take 40 mg by mouth daily.           The results of significant diagnostics from this hospitalization (including imaging, microbiology, ancillary and laboratory) are listed below for reference.    Significant Diagnostic Studies: US Ob Transvaginal  08/02/2011  *RADIOLOGY REPORT*  Clinical Data: Heavy bleeding, pain, evaluate for retained products of conception.  TRANSVAGINAL OB ULTRASOUND  Technique:  Transvaginal ultrasound was performed for evaluation of the gestation as well as the maternal uterus and adnexal regions.  Comparison: 07/21/2011  Findings: No intrauterine gestational sac.  The endometrium is thickened and heterogeneous echogenicity, measuring up to 36 mm in thickness.  There is increased color Doppler flow.  Normal sonographic appearance to the ovaries.  No free fluid.  IMPRESSION: Thickened, heterogeneous endometrium with increased color Doppler flow.  Retained products not excluded.  Original Report Authenticated By: Desiree [name], M.D.   Labs: Basic Metabolic Panel:  Lab 0000000 0630 08/02/11 2004  NA 136 133*  K 3.3* 3.6  CL 96 94*  CO2 27 24  GLUCOSE 122* 117*  BUN 40* 33*  CREATININE 12.78* 11.85*  CALCIUM 9.1 9.6  MG -- --  PHOS -- --   Liver Function Tests:  Lab 08/02/11 2004  AST 10  ALT 8  ALKPHOS 37*  BILITOT 0.2*  PROT 6.8  ALBUMIN 3.3*   CBC:  Lab 08/03/11 1644 08/03/11 0630 08/02/11 2004  WBC -- 10.6* 9.2  NEUTROABS -- -- --  HGB 7.8* 7.7* 9.7*  HCT 22.6* 22.7* 29.4*  MCV -- 93.0 93.9  PLT -- 155 219    Principal Problem:  *Miscarriage Active Problems:  Acute blood loss anemia  ESRD (end stage renal disease)   Hypokalemia   Time coordinating discharge: 25 minutes  Signed:  Murray Hodgkins, MD [facility] 08/03/2011, 6:22 PM

## 2011-08-03 NOTE — Progress Notes (Signed)
[facility] PROGRESS NOTE  Desiree [name] [id number] DOB: 05-23-1975 DOA: 08/02/2011 PCP: Windy Kalata, MD, MD  Assessment/Plan: 1. Acute blood loss anemia: Secondary to incomplete abortion. Bleeding has stopped. Repeat CBC this afternoon. If stable, discharge home. 2. Incomplete abortion with hemorrhage, status post dilatation and evacuation May 19. 3. Hypokalemia: Replete. 4. End-stage renal disease: Routine hemodialysis today. Nephrology consulted. 5. Reported marijuana use  Code Status: Full code Family Communication: None bedside Disposition Plan: Home when hemoglobin stable.  Murray Hodgkins, MD  [facility] Pager [phone]. If 8PM-8AM, please contact night-coverage at [email or web address], password [email or web address] 08/03/2011, 9:51 AM  LOS: 1 day   Brief narrative: 36 year old woman presented with acute hemorrhage related to incomplete miscarriage. She underwent D&C at [facility] and was transferred to [facility] because of her history of end-stage renal disease, blood loss and volume given intraoperatively..  Consultants:  Nephrology  Procedures:  08/02/2011: Dilatation and evacuation  Routine hemodialysis  HPI/Subjective: Afebrile, vital signs stable. Potassium 3.3. Hemoglobin 9.7?7.7. No bleeding. Some pain.  Objective: Filed Vitals:   08/03/11 0145 08/03/11 0231 08/03/11 0533 08/03/11 0940  BP: 88/46 107/67 98/62 112/71  Pulse: 74 73 103 83  Temp: 98.7 F (37.1 C) 98.6 F (37 C) 98.4 F (36.9 C) 98.5 F (36.9 C)  TempSrc:  Oral Oral Oral  Resp: 16 16 16 16   Height:  5\' 5"  (1.651 m)    Weight:  91.7 kg (202 lb 2.6 oz)    SpO2: 96% 98% 98% 99%    Intake/Output Summary (Last 24 hours) at 08/03/11 0951 Last data filed at 08/03/11 0900  Gross per 24 hour  Intake    940 ml  Output    125 ml  Net    815 ml    Exam:   General:  Appears calm and comfortable.  Cardiovascular: Regular rate and rhythm. No murmur, rub,  gallop.  Respiratory: Clear to auscultation bilaterally. No wheezes, rales, rhonchi. Normal respiratory effort.  Data Reviewed: Basic Metabolic Panel:  Lab [id number] 0630 08/02/11 2004  NA 136 133*  K 3.3* 3.6  CL 96 94*  CO2 27 24  GLUCOSE 122* 117*  BUN 40* 33*  CREATININE 12.78* 11.85*  CALCIUM 9.1 9.6  MG -- --  PHOS -- --   Liver Function Tests:  Lab 08/02/11 2004  AST 10  ALT 8  ALKPHOS 37*  BILITOT 0.2*  PROT 6.8  ALBUMIN 3.3*   CBC:  Lab 08/03/11 0630 08/02/11 2004  WBC 10.6* 9.2  NEUTROABS -- --  HGB 7.7* 9.7*  HCT 22.7* 29.4*  MCV 93.0 93.9  PLT 155 219   Studies: US Ob Transvaginal  08/02/2011  *RADIOLOGY REPORT*  Clinical Data: Heavy bleeding, pain, evaluate for retained products of conception.  TRANSVAGINAL OB ULTRASOUND IMPRESSION: Thickened, heterogeneous endometrium with increased color Doppler flow.  Retained products not excluded.  Original Report Authenticated By: Suanne Marker, M.D.   Scheduled Meds:   . calcium acetate  667 mg Oral TID WC  . citric acid-sodium citrate  30 mL Oral Once  . fentaNYL  25 mcg Intravenous Once  . fentaNYL  25 mcg Intravenous Once  . pantoprazole  40 mg Oral Daily  . promethazine  12.5 mg Intravenous Once  . rho(d) immune globulin  300 mcg Intramuscular Once  . sodium chloride  3 mL Intravenous Q12H  . DISCONTD: calcium acetate  667 mg Oral TID WC  . DISCONTD:  HYDROmorphone (DILAUDID) injection  1 mg Intravenous Once  .  DISCONTD: meperidine (DEMEROL) injection  25 mg Intravenous Once  . DISCONTD: promethazine  12.5 mg Intravenous Once  . DISCONTD: rho(d) immune globulin  300 mcg Intramuscular Once   Continuous Infusions:   . DISCONTD: lactated ringers 125 mL/hr at 08/02/11 2030    Principal Problem:  *Miscarriage Active Problems:  Acute blood loss anemia  ESRD (end stage renal disease)  Hypokalemia

## 2011-08-03 NOTE — H&P (Signed)
PCP:   Windy Kalata, MD, MD   Chief Complaint:  Vaginal bleeding  HPI: This is a 36 year old female with history of end-stage renal disease. Patient is G1 P5, today at home after eating she felt a pop after standing and developed significant vaginal bleeding. She went to [facility] ER where she was given RhoGAM and a D&C. The hospitalist service at [facility] was requested to admit post procedure. The gynecologist was concerned patient may need hemodialysis after she received the excess fluids often given during procedures. Patient usually dialyzes Mondays Wednesdays and Fridays. She is due for routine hemodialysis in a.m.  Review of Systems: Positives bolded    anorexia, fever, weight loss,, vision loss, decreased hearing, hoarseness, chest pain, syncope, dyspnea on exertion, peripheral edema, balance deficits, hemoptysis, abdominal pain, melena, hematochezia, severe indigestion/heartburn, hematuria, incontinence, genital sores, muscle weakness, suspicious skin lesions, transient blindness, difficulty walking, depression, unusual weight change, abnormal bleeding, enlarged lymph nodes, angioedema, and breast masses.  Past Medical History: Past Medical History  Diagnosis Date  . Blood transfusion   . Pregnancy induced hypertension   . Preterm labor   . Chronic kidney disease   . Dialysis patient    Past Surgical History  Procedure Date  . Renal biopsy   . Dilation and curettage of uterus     Medications: Prior to Admission medications   Medication Sig Start Date End Date Taking? Authorizing Provider  calcium acetate (PHOSLO) 667 MG capsule Take 667 mg by mouth 3 (three) times daily with meals.     Yes Historical Provider, MD  Esomeprazole Magnesium (NEXIUM PO) Take 40 mg by mouth daily.    Yes Historical Provider, MD    Allergies:   Allergies  Allergen Reactions  . Morphine And Related Other (See Comments)    Patient says she stops breathing Can take percocet  .  Prednisone Other (See Comments)    Patient says prednisone causes her to cramp all over    Social History:  reports that she has never smoked. She does not have any smokeless tobacco history on file. She reports that she uses illicit drugs (Marijuana) about 7 times per week. She reports that she does not drink alcohol.  Family History: Family History  Problem Relation Age of Onset  . Hypertension    . Diabetes type II      Physical Exam: Filed Vitals:   08/03/11 0115 08/03/11 0130 08/03/11 0145 08/03/11 0231  BP: 117/71 89/50 88/46  107/67  Pulse: 76 76 74 73  Temp:   98.7 F (37.1 C) 98.6 F (37 C)  TempSrc:    Oral  Resp: 20 16 16 16   Height:    5\' 5"  (1.651 m)  Weight:    91.7 kg (202 lb 2.6 oz)  SpO2: 99% 98% 96% 98%    General:  Alert and oriented times three, well developed and nourished, no acute distress Eyes: PERRLA, pink conjunctiva, no scleral icterus ENT: Moist oral mucosa, neck supple, no thyromegaly Lungs: clear to ascultation, no wheeze, no crackles, no use of accessory muscles Cardiovascular: regular rate and rhythm, no regurgitation, no gallops, no murmurs. No carotid bruits, no JVD Abdomen: soft, positive BS, nonspecific tenderness to palpation, non-distended, no organomegaly, not an acute abdomen GU: not examined Neuro: CN II - XII grossly intact, sensation intact Musculoskeletal: strength 5/5 all extremities, no clubbing, cyanosis or edema Skin: no rash, no subcutaneous crepitation, no decubitus Psych: appropriate patient   Labs on Admission:   [facility] 08/02/11 2004  NA 133*  K 3.6  CL 94*  CO2 24  GLUCOSE 117*  BUN 33*  CREATININE 11.85*  CALCIUM 9.6  MG --  PHOS --    Basename 08/02/11 2004  AST 10  ALT 8  ALKPHOS 37*  BILITOT 0.2*  PROT 6.8  ALBUMIN 3.3*   No results found for this basename: LIPASE:2,AMYLASE:2 in the last 72 hours  Basename 08/02/11 2004  WBC 9.2  NEUTROABS --  HGB 9.7*  HCT 29.4*  MCV 93.9  PLT 219    No results found for this basename: CKTOTAL:3,CKMB:3,CKMBINDEX:3,TROPONINI:3 in the last 72 hours No components found with this basename: POCBNP:3 No results found for this basename: DDIMER:2 in the last 72 hours No results found for this basename: HGBA1C:2 in the last 72 hours No results found for this basename: CHOL:2,HDL:2,LDLCALC:2,TRIG:2,CHOLHDL:2,LDLDIRECT:2 in the last 72 hours No results found for this basename: TSH,T4TOTAL,FREET3,T3FREE,THYROIDAB in the last 72 hours No results found for this basename: VITAMINB12:2,FOLATE:2,FERRITIN:2,TIBC:2,IRON:2,RETICCTPCT:2 in the last 72 hours  Micro Results: No results found for this or any previous visit (from the past 240 hour(s)).   Radiological Exams on Admission: US Ob Transvaginal  08/02/2011  *RADIOLOGY REPORT*  Clinical Data: Heavy bleeding, pain, evaluate for retained products of conception.  TRANSVAGINAL OB ULTRASOUND  Technique:  Transvaginal ultrasound was performed for evaluation of the gestation as well as the maternal uterus and adnexal regions.  Comparison: 07/21/2011  Findings: No intrauterine gestational sac.  The endometrium is thickened and heterogeneous echogenicity, measuring up to 36 mm in thickness.  There is increased color Doppler flow.  Normal sonographic appearance to the ovaries.  No free fluid.  IMPRESSION: Thickened, heterogeneous endometrium with increased color Doppler flow.  Retained products not excluded.  Original Report Authenticated By: Suanne Marker, M.D.    Assessment/Plan Present on Admission:   end-stage renal disease She was at the [facility] hospital for a missed abortion and a tenuous acute hemorrhage. She is status post D&C. Transferred to [facility] as it was so she may need dialysis post procedure because of excess fluids normally given intraoperatively. During my interview patient is not in distress. Consult nephrology in a.m. for routine hemodialysis.  .Miscarriage Anemia due to acute  hemorrhage Monitor H&H, type and screen. Transfuse if needed Per OB/GYN doctor cathy Quentin Cornwall patient may be discharged in a.m. if hemoglobin stable. Patient's full with her outpatient gynecologist  Full code SCDs for DVT prophylaxis Team 5/Dr. Dois Davenport, Maximino Cozzolino 08/03/2011, 3:48 AM

## 2011-08-03 NOTE — Anesthesia Postprocedure Evaluation (Signed)
  Anesthesia Post-op Note  Patient: Desiree [name]  Procedure(s) Performed: Procedure(s) (LRB): DILATATION AND EVACUATION ()   Patient is awake, responsive, moving her legs, and has signs of resolution of her numbness. Pain and nausea are reasonably well controlled. Vital signs are stable and clinically acceptable. Oxygen saturation is clinically acceptable. There are no apparent anesthetic complications at this time. Patient is ready for discharge.

## 2011-08-04 LAB — RH IG WORKUP (INCLUDES ABO/RH)
ABO/RH(D): B NEG
Unit division: 0

## 2011-08-06 LAB — TYPE AND SCREEN
ABO/RH(D): B NEG
Antibody Screen: NEGATIVE
Unit division: 0
Unit division: 0

## 2012-04-19 ENCOUNTER — Encounter (HOSPITAL_COMMUNITY): Payer: Self-pay | Admitting: *Deleted

## 2012-04-19 ENCOUNTER — Non-Acute Institutional Stay (HOSPITAL_COMMUNITY)
Admission: EM | Admit: 2012-04-19 | Discharge: 2012-04-20 | Disposition: A | Discharge status: Other Acute Inpatient Hospital | Payer: Medicare Other | Attending: Nephrology | Admitting: Nephrology

## 2012-04-19 DIAGNOSIS — T481X4A Poisoning by skeletal muscle relaxants [neuromuscular blocking agents], undetermined, initial encounter: Secondary | ICD-10-CM | POA: Insufficient documentation

## 2012-04-19 DIAGNOSIS — I120 Hypertensive chronic kidney disease with stage 5 chronic kidney disease or end stage renal disease: Secondary | ICD-10-CM | POA: Insufficient documentation

## 2012-04-19 DIAGNOSIS — T50901A Poisoning by unspecified drugs, medicaments and biological substances, accidental (unintentional), initial encounter: Secondary | ICD-10-CM

## 2012-04-19 DIAGNOSIS — Z992 Dependence on renal dialysis: Secondary | ICD-10-CM | POA: Insufficient documentation

## 2012-04-19 DIAGNOSIS — D649 Anemia, unspecified: Secondary | ICD-10-CM | POA: Insufficient documentation

## 2012-04-19 DIAGNOSIS — T398X2A Poisoning by other nonopioid analgesics and antipyretics, not elsewhere classified, intentional self-harm, initial encounter: Secondary | ICD-10-CM | POA: Insufficient documentation

## 2012-04-19 DIAGNOSIS — R45851 Suicidal ideations: Secondary | ICD-10-CM

## 2012-04-19 DIAGNOSIS — N19 Unspecified kidney failure: Secondary | ICD-10-CM

## 2012-04-19 DIAGNOSIS — T50992A Poisoning by other drugs, medicaments and biological substances, intentional self-harm, initial encounter: Secondary | ICD-10-CM | POA: Insufficient documentation

## 2012-04-19 DIAGNOSIS — T398X1A Poisoning by other nonopioid analgesics and antipyretics, not elsewhere classified, accidental (unintentional), initial encounter: Secondary | ICD-10-CM | POA: Insufficient documentation

## 2012-04-19 DIAGNOSIS — N186 End stage renal disease: Secondary | ICD-10-CM | POA: Insufficient documentation

## 2012-04-19 DIAGNOSIS — T394X2A Poisoning by antirheumatics, not elsewhere classified, intentional self-harm, initial encounter: Secondary | ICD-10-CM | POA: Insufficient documentation

## 2012-04-19 DIAGNOSIS — N2581 Secondary hyperparathyroidism of renal origin: Secondary | ICD-10-CM | POA: Insufficient documentation

## 2012-04-19 LAB — COMPREHENSIVE METABOLIC PANEL
ALT: 5 U/L (ref 0–35)
AST: 11 U/L (ref 0–37)
Alkaline Phosphatase: 45 U/L (ref 39–117)
CO2: 19 mEq/L (ref 19–32)
Calcium: 9.6 mg/dL (ref 8.4–10.5)
Chloride: 96 mEq/L (ref 96–112)
GFR calc Af Amer: 3 mL/min — ABNORMAL LOW (ref >90)
GFR calc non Af Amer: 2 mL/min — ABNORMAL LOW (ref >90)
Glucose, Bld: 95 mg/dL (ref 70–99)
Potassium: 4.6 mEq/L (ref 3.5–5.1)
Sodium: 136 mEq/L (ref 135–145)
Total Bilirubin: 0.2 mg/dL — ABNORMAL LOW (ref 0.3–1.2)

## 2012-04-19 LAB — RAPID URINE DRUG SCREEN, HOSP PERFORMED
Amphetamines: NOT DETECTED
Benzodiazepines: NOT DETECTED
Tetrahydrocannabinol: NOT DETECTED

## 2012-04-19 LAB — HCG, QUANTITATIVE, PREGNANCY: hCG, Beta Chain, Quant, S: <1 m[IU]/mL (ref <5)

## 2012-04-19 LAB — CBC
Hemoglobin: 10.5 g/dL — ABNORMAL LOW (ref 12.0–15.0)
MCH: 31.4 pg (ref 26.0–34.0)
Platelets: 225 10*3/uL (ref 150–400)
RBC: 3.34 MIL/uL — ABNORMAL LOW (ref 3.87–5.11)
WBC: 6.1 10*3/uL (ref 4.0–10.5)

## 2012-04-19 LAB — GLUCOSE, CAPILLARY: Glucose-Capillary: 80 mg/dL (ref 70–99)

## 2012-04-19 LAB — SALICYLATE LEVEL: Salicylate Lvl: <2 mg/dL — ABNORMAL LOW (ref 2.8–20.0)

## 2012-04-19 MED ORDER — ONDANSETRON HCL 8 MG PO TABS: 4.0000 mg | ORAL_TABLET | Freq: Three times a day (TID) | ORAL | Status: DC | PRN

## 2012-04-19 MED ORDER — LORAZEPAM 1 MG PO TABS: 1.0000 mg | ORAL_TABLET | Freq: Three times a day (TID) | ORAL | Status: DC | PRN

## 2012-04-19 NOTE — ED Notes (Signed)
cbg done on right hand. Left harm, restricted. cbg-80

## 2012-04-19 NOTE — ED Provider Notes (Signed)
History     CSN: [id number]  Arrival date & time 04/19/12  1501   First MD Initiated Contact with Patient 04/19/12 1557      Chief Complaint  Patient presents with  . Drug Overdose     Patient is a 37 y.o. female presenting with Overdose. The history is provided by the patient.  Drug Overdose This is a new problem. Episode onset: just prior to arrival. The problem occurs constantly. The problem has been gradually worsening. Pertinent negatives include no chest pain and no shortness of breath. Nothing aggravates the symptoms. Nothing relieves the symptoms. She has tried nothing for the symptoms.  pt presents with drug overdose She presents with family It is reported she took multiple pills out of two separate bottles She reports she was trying to harm herself Pt is a dialysis patient, she missed yesterday's appointment (MWF dialysis)   Past Medical History  Diagnosis Date  . Blood transfusion   . Pregnancy induced hypertension   . Preterm labor   . Dialysis patient   . Chronic kidney disease     Past Surgical History  Procedure Date  . Renal biopsy   . Dilation and curettage of uterus   . Dilation and evacuation 08/02/2011    Procedure: DILATATION AND EVACUATION;  Surgeon: Logan Bores, MD;  Location: [facility];  Service: Gynecology;;    Family History  Problem Relation Age of Onset  . Hypertension    . Diabetes type II      History  Substance Use Topics  . Smoking status: Never Smoker   . Smokeless tobacco: Not on file  . Alcohol Use: No    OB History    Grav Para Term Preterm Abortions TAB SAB Ect Mult Living   5 2 1 1 1 1  0 0 0 1      Review of Systems  Constitutional: Negative for fever.  Respiratory: Negative for shortness of breath.   Cardiovascular: Negative for chest pain.  Neurological: Positive for weakness.  Psychiatric/Behavioral: Positive for suicidal ideas.  All other systems reviewed and are negative.    Allergies  Morphine and  related and Prednisone  Home Medications   Current Outpatient Rx  Name  Route  Sig  Dispense  Refill  . CALCIUM ACETATE 667 MG PO CAPS   Oral   Take 667 mg by mouth 3 (three) times daily with meals.           Marland Kitchen NEXIUM PO   Oral   Take 40 mg by mouth daily.            BP 136/82  Pulse 103  Temp 99.4 F (37.4 C) (Oral)  Resp 16  SpO2 98%  LMP 04/10/2012  Breastfeeding? Unknown  Physical Exam CONSTITUTIONAL:sleeping but easily arousable HEAD AND FACE: Normocephalic/atraumatic EYES: EOMI, pupils pinpoint ENMT: Mucous membranes moist NECK: supple no meningeal signs SPINE:entire spine nontender CV: S1/S2 noted, no murmurs/rubs/gallops noted LUNGS: Lungs are clear to auscultation bilaterally, no apparent distress ABDOMEN: soft, nontender, no rebound or guarding GU:no cva tenderness NEURO: Pt is somnolent but easily arousable and answers all questions appropriately, moves all extremitiesx4 EXTREMITIES: pulses normal, full ROM. Dialysis access to left UE, thrill noted SKIN: warm, color normal PSYCH: somnolent  ED Course  Procedures (including critical care time)  Labs Reviewed  CBC - Abnormal; Notable for the following:    RBC 3.34 (*)     Hemoglobin 10.5 (*)     HCT 31.3 (*)  All other components within normal limits  COMPREHENSIVE METABOLIC PANEL  ETHANOL  ACETAMINOPHEN LEVEL  SALICYLATE LEVEL  URINE RAPID DRUG SCREEN (HOSP PERFORMED)  4:54 PM Pt here for overdose, somnolent but easily arousable She took pills out of a flexeril bottle and darvocet bottle.  The bottles are both expired and it is not clear what was actually in them.  Family at bedside reports that some of the pills "were different colors"   Will need 4 hr tylenol level and reassess 6:19 PM Pt awake/alert, no distress at this time 8:21 PM Pt resting comfortably and easily arousable She reports taking meds earlier today, none yesterday Repeat APAP level noted and minimally elevated.  I  spoke to [facility], and they will close case (apap toxicity unlikely even with h/o renal failure) Will call ACT and also nephrology to arrange dialysis 8:28 PM D/w nephrology.  Will arrange dialysis while she is waiting in the ER Awaiting ACT consult  MDM  Nursing notes including past medical history and social history reviewed and considered in documentation Labs/vital reviewed and considered         Date: 04/19/2012 1511  Rate: 97  Rhythm: normal sinus rhythm  QRS Axis: normal  Intervals: normal  ST/T Wave abnormalities: normal  Conduction Disutrbances:none  Narrative Interpretation:   Old EKG Reviewed: unchanged     Date: 04/19/2012 1941  Rate: 69  Rhythm: normal sinus rhythm  QRS Axis: normal  Intervals: normal  ST/T Wave abnormalities: normal  Conduction Disutrbances:none  Narrative Interpretation:   Old EKG Reviewed: unchanged from earlier today    Sharyon Cable, MD 04/19/12 2028

## 2012-04-19 NOTE — ED Notes (Signed)
Pt remains lethargic but responds appropriately to all questions. AO x4. Family at bedside.

## 2012-04-19 NOTE — ED Notes (Addendum)
Poison Control. Joyce,contacted concerning patient.   Their findings are that Darvocet was taken off the market due to widening of the QRS complex. It can also cause ventricular dysrhythmias, bradycardia and hypotension. If the QRS widens greater than 140, then based off of pts weigh 2 amps of NaBicarb should be given bolus.    Flexeril can cause urinary retention, tachycardia, and initially hypertension leading to hypotension.   Poison control recommended watching patient for 6 hours with supportive care, with 4 hour tylenol levels.   All of the above information communicated with MD Wickline.

## 2012-04-19 NOTE — ED Notes (Signed)
Patient urine colleted and labeling and verified according to new protocol.

## 2012-04-19 NOTE — ED Notes (Addendum)
Pt's ex-partner brought pt to ED stating she overdosed on pills at 1430.  Ex does not know what pills, but feels pt took the whole bottle.  Pt states she was trying to kill herself and took 12 darvocet and 10 flexeril, however, that cannot be verified.  Pt appears sleepy.  Dialysis MWF, though pt refused to go to dialysis yesterday.

## 2012-04-19 NOTE — ED Notes (Signed)
Pt reports taking 11 Flexeril and 12 Darvocet. Empty bottles brought with pt's girlfriend. Per pt she wanted to kill herself "to make everyone else happy." States the last time she attempted suicide was 12/07 by "taking a bunch of antibiotics." Pt AO x4; lethargic but responding to all questions.

## 2012-04-19 NOTE — BH Assessment (Signed)
[facility] Assessment Progress Note   Clinician attempted to assess patient but she is too sleepy at this time.

## 2012-04-20 ENCOUNTER — Inpatient Hospital Stay (HOSPITAL_COMMUNITY)
Admission: EM | Admit: 2012-04-20 | Discharge: 2012-04-25 | DRG: 881 | Disposition: A | Discharge status: Home / Self Care | Payer: Medicare Other | Source: Intra-hospital | Attending: Psychiatry | Admitting: Psychiatry

## 2012-04-20 ENCOUNTER — Encounter (HOSPITAL_COMMUNITY): Payer: Self-pay | Admitting: Emergency Medicine

## 2012-04-20 DIAGNOSIS — N186 End stage renal disease: Secondary | ICD-10-CM

## 2012-04-20 DIAGNOSIS — F329 Major depressive disorder, single episode, unspecified: Principal | ICD-10-CM | POA: Diagnosis present

## 2012-04-20 DIAGNOSIS — F411 Generalized anxiety disorder: Secondary | ICD-10-CM | POA: Diagnosis present

## 2012-04-20 DIAGNOSIS — E876 Hypokalemia: Secondary | ICD-10-CM

## 2012-04-20 DIAGNOSIS — D649 Anemia, unspecified: Secondary | ICD-10-CM

## 2012-04-20 DIAGNOSIS — Z79899 Other long term (current) drug therapy: Secondary | ICD-10-CM

## 2012-04-20 DIAGNOSIS — F3289 Other specified depressive episodes: Principal | ICD-10-CM | POA: Diagnosis present

## 2012-04-20 DIAGNOSIS — N189 Chronic kidney disease, unspecified: Secondary | ICD-10-CM | POA: Diagnosis present

## 2012-04-20 DIAGNOSIS — F32A Depression, unspecified: Secondary | ICD-10-CM | POA: Diagnosis present

## 2012-04-20 LAB — BASIC METABOLIC PANEL
BUN: 19 mg/dL (ref 6–23)
Calcium: 10.1 mg/dL (ref 8.4–10.5)
Creatinine, Ser: 7.74 mg/dL — ABNORMAL HIGH (ref 0.50–1.10)
GFR calc Af Amer: 7 mL/min — ABNORMAL LOW (ref >90)
GFR calc non Af Amer: 6 mL/min — ABNORMAL LOW (ref >90)
Glucose, Bld: 112 mg/dL — ABNORMAL HIGH (ref 70–99)

## 2012-04-20 MED ORDER — ALTEPLASE 2 MG IJ SOLR: 2.0000 mg | Freq: Once | INTRAMUSCULAR | Status: DC | PRN

## 2012-04-20 MED ORDER — MAGNESIUM HYDROXIDE 400 MG/5ML PO SUSP: 30.0000 mL | Freq: Every day | ORAL | Status: DC | PRN

## 2012-04-20 MED ORDER — LIDOCAINE-PRILOCAINE 2.5-2.5 % EX CREA: 1.0000 "application " | TOPICAL_CREAM | CUTANEOUS | Status: DC | PRN

## 2012-04-20 MED ORDER — RENA-VITE PO TABS
1.0000 | ORAL_TABLET | Freq: Every day | ORAL | Status: DC
  Administered 2012-04-20: 1 via ORAL
  Filled 2012-04-20: qty 1

## 2012-04-20 MED ORDER — LIDOCAINE HCL (PF) 1 % IJ SOLN: 5.0000 mL | INTRAMUSCULAR | Status: DC | PRN

## 2012-04-20 MED ORDER — HEPARIN SODIUM (PORCINE) 1000 UNIT/ML DIALYSIS: 100.0000 [IU]/kg | INTRAMUSCULAR | Status: DC | PRN

## 2012-04-20 MED ORDER — CALCIUM ACETATE 667 MG PO CAPS
667.0000 mg | ORAL_CAPSULE | Freq: Three times a day (TID) | ORAL | Status: DC
  Administered 2012-04-21 – 2012-04-25 (×12): 667 mg via ORAL
  Filled 2012-04-20 (×16): qty 1

## 2012-04-20 MED ORDER — HYDROXYZINE HCL 25 MG PO TABS: 12.5000 mg | ORAL_TABLET | Freq: Four times a day (QID) | ORAL | Status: DC | PRN

## 2012-04-20 MED ORDER — ZIPRASIDONE MESYLATE 20 MG IM SOLR: 10.0000 mg | Freq: Once | INTRAMUSCULAR | Status: DC

## 2012-04-20 MED ORDER — LORAZEPAM 2 MG/ML IJ SOLN
1.0000 mg | Freq: Once | INTRAMUSCULAR | Status: DC
  Filled 2012-04-20: qty 1

## 2012-04-20 MED ORDER — HYDROXYZINE HCL 25 MG PO TABS
25.0000 mg | ORAL_TABLET | Freq: Every evening | ORAL | Status: DC | PRN
  Administered 2012-04-22: 25 mg via ORAL
  Filled 2012-04-20 (×10): qty 1

## 2012-04-20 MED ORDER — DARBEPOETIN ALFA-POLYSORBATE 25 MCG/0.42ML IJ SOLN: 25.0000 ug | INTRAMUSCULAR | Status: DC

## 2012-04-20 MED ORDER — NEPRO/CARBSTEADY PO LIQD: 237.0000 mL | ORAL | Status: DC | PRN

## 2012-04-20 MED ORDER — SODIUM CHLORIDE 0.9 % IV SOLN: 100.0000 mL | INTRAVENOUS | Status: DC | PRN

## 2012-04-20 MED ORDER — PANTOPRAZOLE SODIUM 40 MG PO TBEC
40.0000 mg | DELAYED_RELEASE_TABLET | Freq: Every day | ORAL | Status: DC
  Administered 2012-04-21 – 2012-04-25 (×5): 40 mg via ORAL
  Filled 2012-04-20 (×6): qty 1

## 2012-04-20 MED ORDER — HALOPERIDOL LACTATE 5 MG/ML IJ SOLN
INTRAMUSCULAR | Status: AC
  Filled 2012-04-20: qty 1

## 2012-04-20 MED ORDER — HEPARIN SODIUM (PORCINE) 1000 UNIT/ML DIALYSIS: 1000.0000 [IU] | INTRAMUSCULAR | Status: DC | PRN

## 2012-04-20 MED ORDER — LANTHANUM CARBONATE 500 MG PO CHEW
1000.0000 mg | CHEWABLE_TABLET | Freq: Three times a day (TID) | ORAL | Status: DC
  Administered 2012-04-20: 1000 mg via ORAL
  Filled 2012-04-20 (×3): qty 2

## 2012-04-20 MED ORDER — ALUM & MAG HYDROXIDE-SIMETH 200-200-20 MG/5ML PO SUSP: 30.0000 mL | ORAL | Status: DC | PRN

## 2012-04-20 MED ORDER — PENTAFLUOROPROP-TETRAFLUOROETH EX AERO: 1.0000 "application " | INHALATION_SPRAY | CUTANEOUS | Status: DC | PRN

## 2012-04-20 MED ORDER — ZIPRASIDONE MESYLATE 20 MG IM SOLR
10.0000 mg | Freq: Once | INTRAMUSCULAR | Status: AC
  Administered 2012-04-20: 10 mg via INTRAMUSCULAR
  Filled 2012-04-20: qty 20

## 2012-04-20 MED ORDER — CINACALCET HCL 30 MG PO TABS: 30.0000 mg | ORAL_TABLET | Freq: Every day | ORAL | Status: DC

## 2012-04-20 MED ORDER — ACETAMINOPHEN 325 MG PO TABS
650.0000 mg | ORAL_TABLET | Freq: Four times a day (QID) | ORAL | Status: DC | PRN
  Administered 2012-04-23: 650 mg via ORAL

## 2012-04-20 NOTE — ED Notes (Signed)
Pt continuing to yell out explitives and attempting to close door against [employer] officer and trained sitter. EDP aware.

## 2012-04-20 NOTE — Tx Team (Signed)
Initial Interdisciplinary Treatment Plan  PATIENT STRENGTHS: (choose at least two) Communication skills  PATIENT STRESSORS: Health problems   PROBLEM LIST: Problem List/Patient Goals Date to be addressed Date deferred Reason deferred Estimated date of resolution  Depression 04/20/12     Suicide Risk 04/20/12                                                DISCHARGE CRITERIA:  Improved stabilization in mood, thinking, and/or behavior  PRELIMINARY DISCHARGE PLAN: Outpatient therapy Return to previous living arrangement  PATIENT/FAMIILY INVOLVEMENT: This treatment plan has been presented to and reviewed with the patient, Desiree [name], and/or family member, .  The patient and family have been given the opportunity to ask questions and make suggestions.  [name] 04/20/2012, 10:57 PM

## 2012-04-20 NOTE — Procedures (Signed)
I was present at this session.  I have reviewed the session itself and made appropriate changes.  Below dry, go for 3 liters  Tivon Lemoine L 2/5/20148:42 AM

## 2012-04-20 NOTE — ED Notes (Signed)
Patient ambulated to restroom with sitter at side.  Patient complains of a little lightheadedness when ambulating.

## 2012-04-20 NOTE — Consult Note (Signed)
[facility] Renal Consultation Note    Indication for Consultation:  Management of ESRD/hemodialysis; anemia, hypertension/volume and secondary hyperparathyroidism  HPI: Desiree [name] is a 37 y.o. female ESRD patient (MWF HD) who presented to the ED yesterday with intentional drug overdose.  A family friend reports that she may have taken 78 Darvocet 26 flexeril although this cannot be verified.  The patient did endorse that she wanted to end her life. This is not thought to be a new problem.  She dialyzes on MWF but refused to go for her regular appointment on Monday.  She is resting in bed today on HD with no complaints other than mild lightheadedness.  She denies HA, SOB, nausea, vomiting or diarrhea. A sitter is present.  Past Medical History  Diagnosis Date  . Blood transfusion   . Pregnancy induced hypertension   . Preterm labor   . Dialysis patient   . Chronic kidney disease    Past Surgical History  Procedure Date  . Renal biopsy   . Dilation and curettage of uterus   . Dilation and evacuation 08/02/2011    Procedure: DILATATION AND EVACUATION;  Surgeon: Logan Bores, MD;  Location: [facility];  Service: Gynecology;;   Family History  Problem Relation Age of Onset  . Hypertension    . Diabetes type II     Social History:  reports that she has never smoked. She does not have any smokeless tobacco history on file. She reports that she uses illicit drugs (Marijuana) about 7 times per week. She reports that she does not drink alcohol. Allergies  Allergen Reactions  . Morphine And Related Other (See Comments)    Patient says she stops breathing Can take percocet  . Prednisone Other (See Comments)    Patient says prednisone causes her to cramp all over   Prior to Admission medications   Medication Sig Start Date End Date Taking? Authorizing Provider  calcium acetate (PHOSLO) 667 MG capsule Take 667 mg by mouth 3 (three) times daily with meals.     Yes  Historical Provider, MD  Esomeprazole Magnesium (NEXIUM PO) Take 40 mg by mouth daily.    Yes Historical Provider, MD   Current Facility-Administered Medications  Medication Dose Route Frequency Provider Last Rate Last Dose  . 0.9 %  sodium chloride infusion  100 mL Intravenous PRN Donetta Potts, MD      . 0.9 %  sodium chloride infusion  100 mL Intravenous PRN Donetta Potts, MD      . alteplase (CATHFLO ACTIVASE) injection 2 mg  2 mg Intracatheter Once PRN Donetta Potts, MD      . feeding supplement (NEPRO CARB STEADY) liquid 237 mL  237 mL Oral PRN Donetta Potts, MD      . heparin injection 1,000 Units  1,000 Units Dialysis PRN Donetta Potts, MD      . heparin injection 100 Units/kg  100 Units/kg Dialysis PRN Donetta Potts, MD      . lidocaine (XYLOCAINE) 1 % injection 5 mL  5 mL Intradermal PRN Donetta Potts, MD      . lidocaine-prilocaine (EMLA) cream 1 application  1 application Topical PRN Donetta Potts, MD      . LORazepam (ATIVAN) tablet 1 mg  1 mg Oral Q8H PRN Sharyon Cable, MD      . ondansetron [facility]) tablet 4 mg  4 mg Oral Q8H PRN Sharyon Cable, MD      .  pentafluoroprop-tetrafluoroeth (GEBAUERS) aerosol 1 application  1 application Topical PRN Donetta Potts, MD       Current Outpatient Prescriptions  Medication Sig Dispense Refill  . calcium acetate (PHOSLO) 667 MG capsule Take 667 mg by mouth 3 (three) times daily with meals.        . Esomeprazole Magnesium (NEXIUM PO) Take 40 mg by mouth daily.        Labs: Basic Metabolic Panel:  Lab [id number] 1512  NA 136  K 4.6  CL 96  CO2 19  GLUCOSE 95  BUN 57*  CREATININE 16.89*  CALCIUM 9.6  ALB --  PHOS --   Liver Function Tests:  Lab 04/19/12 1512  AST 11  ALT 5  ALKPHOS 45  BILITOT 0.2*  PROT 8.4*  ALBUMIN 4.0   CBC:  Lab 04/19/12 1512  WBC 6.1  NEUTROABS --  HGB 10.5*  HCT 31.3*  MCV 93.7  PLT 225   CBG:  Lab 04/19/12 1633  GLUCAP 80    Studies/Results: No results found.  ROS: (+) lightheadedness.  10-pt ROS asked and answered.  All other systems negative except as described in the HPI above.   Physical Exam: Filed Vitals:   04/20/12 0834 04/20/12 0900 04/20/12 0930 04/20/12 1000  BP: 140/66 125/86 126/81 120/78  Pulse: 66 65 74 76  Temp:      TempSrc:      Resp:      SpO2:         General: Well developed, well nourished, in no acute distress. Head: Normocephalic, atraumatic, sclera non-icteric, mucus membranes are moist Neck: Supple. + JVD Lungs: Clear bilaterally to auscultation without wheezes, rales, or rhonchi. Breathing is unlabored. Heart: RRR with S1 S2. No murmurs, rubs, or gallops appreciated.gr2/6 flow M Abdomen: Soft, non-tender, non-distended with normoactive bowel sounds. No rebound/guarding. No obvious abdominal masses.Obese M-S:  Strength and tone appear normal for age. Lower extremities:without edema or ischemic changes, no open wounds  Neuro: Alert and oriented X 3. Moves all extremities spontaneously. Psych:  Responds to questions appropriately with a normal affect.Angry about going to Psych Dialysis Access: LFA AVF buttonhole - cannulated and running well  Dialysis Orders: Center: [facility]  on MWF. EDW 85.5 kg HD Bath 2K/2.25 Ca  Time 3:45 Heparin 8000 . Access LFA Buttonhole/ Self-stick  BFR 400 DFR A1.5   Hectorol 0 mcg IV/HD Epogen 1600   Units IV/HD  Venofer  0    Assessment/Plan: 1.  Drug Overdose: Recurring problem with endorsed intent to harm self.  Admitted to [facility] and awaiting bed. 2.  ESRD -  MWF @ [facility]. Missed HD on Monday.K+ 4.6 3.  Hypertension/volume  - 120/78 most recently. No antihypertensives op. Missed Monday HD. 6+ kg above EDW.  Dialyzing today.  4.  Anemia  - Hgb 10.5 on op Epogen.  Aranesp 25 for next tx if still admitted. Tsat 54%, No IV Fe for now. 5.  Metabolic bone disease -  Ca 9.6. Phos 5.7 on Fosrenol 1000  and Phoslo 2 TIDWC. PTH 350.1 on Sensipar 30 QD. Meds  ordered. 6.  Nutrition - 4.0 - high protein renal diet.  Sonnie Alamo, PA-C [facility] [phone] pager 04/20/2012, 10:30 AM I have seen and examined this patient and agree with the plan of care see above. Stable to go to [facility] .  Keora Eccleston L 04/20/2012, 11:38 AM

## 2012-04-20 NOTE — ED Notes (Addendum)
Pt requested to use a cellular telephone. Pt was told that she can only use the telephone at the nurse's station, but could retrieve a phone number from her visitor and record it on a piece of paper to bring out to the nurse's station.  Upon returning to the room with paper, pt was yelling and cursing at me stating "You can't keep me here, I'll sign an AMA paper, you can't keep me here." Pt was informed of IVC status. Pt began to walk down hallway with her significant other. Security, [employer], charge nurse, and multiple staff members arrived to assist in stopping pt from leaving the premesis.

## 2012-04-20 NOTE — ED Notes (Signed)
ACT team member at bedside to speak with patient.

## 2012-04-20 NOTE — ED Notes (Signed)
Report given to dialysis nurse.

## 2012-04-20 NOTE — ED Notes (Signed)
Care transferred and report given to Santiago Glad, [place]

## 2012-04-20 NOTE — ED Notes (Signed)
Pt cooperative with mediaction administration.

## 2012-04-20 NOTE — ED Notes (Signed)
Report called to Butch Penny, RN at [facility]--- repeat Acetaminophen level prior to transfer

## 2012-04-20 NOTE — Progress Notes (Signed)
Patient ID: Desiree [name], female   DOB: 06/14/1975, 37 y.o.   MRN: [id number] Admission Note: Patient admitted involuntarily to [facility] for SI and attempted overdose; pt states she took 92 Darvocet and 11 Flexeril tabs. Pt states she told her girlfriend who then brought her to the ED for an evaluation. Pt states she is not sure what triggered the OD. Pt denies SI or thoughts to harm self at this time. Pt does voice a growing concern over her health. Pt currently on dialysis on Monday, Wed and Fri. Pt noted to be verbally aggressive in the ED and per report, pt attempted to leave through the doors. Pt states she came in voluntary and doesn't know why they are trying to keep her. Pt tearful at times. Q 15 minute safety checks initiated per protocol.

## 2012-04-20 NOTE — Progress Notes (Signed)
Hemodialysis-See Flowsheet No issues during treatment. Pt awaiting bed at [facility], Per Dr. Jimmy Footman pt is to be transported to her outpatient clinic [facility]) for dialysis on her MWF days. Working on making arrangements now.

## 2012-04-20 NOTE — ED Notes (Signed)
Pt laying down at this time. Pharmacologist and [employer] remains at bedside.

## 2012-04-20 NOTE — BH Assessment (Signed)
Assessment Note   Desiree [name] is an 37 y.o. female.  Patient was brought to [facility] by her girlfriend after ingesting 11 flexoril and 12 darvocet.  When asked her intention when taking the medications she said "I was trying to kill myself because everyone would be better off without me."  Patient lives with her boyfriend and her 38 year old daughter.  She said that she felt like neither her girlfriend or her boyfriend pay her any attention and that he daughter does not care about her.  Patient says that she has attempted to kill herself three times before.  During the last year she has had a miscarriage and her mother died this past year.  Patient cannot currently contract for safety.  She denies any HI or A/V hallucinations.  Patient also has dialysis Monday, Wednesday & Fridays.  She skipped it this past Monday (02/03 because she did not feel good.  Patient will be dialyzed at [facility] in the AM on 02/05.  Patient to be reviewed for possible admission to [facility] after dialysis is complete.   Axis I: Depressive Disorder NOS Axis II: Deferred Axis III:  Past Medical History  Diagnosis Date  . Blood transfusion   . Pregnancy induced hypertension   . Preterm labor   . Dialysis patient   . Chronic kidney disease    Axis IV: economic problems, other psychosocial or environmental problems and problems with primary support group Axis V: 31-40 impairment in reality testing  Past Medical History:  Past Medical History  Diagnosis Date  . Blood transfusion   . Pregnancy induced hypertension   . Preterm labor   . Dialysis patient   . Chronic kidney disease     Past Surgical History  Procedure Date  . Renal biopsy   . Dilation and curettage of uterus   . Dilation and evacuation 08/02/2011    Procedure: DILATATION AND EVACUATION;  Surgeon: Logan Bores, MD;  Location: [place] [facility];  Service: Gynecology;;    Family History:  Family History  Problem Relation Age of Onset  . Hypertension     . Diabetes type II      Social History:  reports that she has never smoked. She does not have any smokeless tobacco history on file. She reports that she uses illicit drugs (Marijuana) about 7 times per week. She reports that she does not drink alcohol.  Additional Social History:  Alcohol / Drug Use Pain Medications: See mecication reconcilliation Prescriptions: See medication reconcilliation Over the Counter: See med reconcilliation History of alcohol / drug use?: Yes Substance #1 Name of Substance 1: Marijuana, pt reports she uses it for medicinal purposes.  To boost her appetite 1 - Age of First Use: 37 years of age 30 - Amount (size/oz): 2 blunts per day 1 - Frequency: Daily use 1 - Duration: On-going 1 - Last Use / Amount: 02/03 Smoked two blunts  CIWA: CIWA-Ar BP: 104/75 mmHg Pulse Rate: 67  COWS:    Allergies:  Allergies  Allergen Reactions  . Morphine And Related Other (See Comments)    Patient says she stops breathing Can take percocet  . Prednisone Other (See Comments)    Patient says prednisone causes her to cramp all over    Home Medications:  (Not in a hospital admission)  OB/GYN Status:  Patient's last menstrual period was 04/10/2012.  General Assessment Data Location of Assessment: [facility] ED Living Arrangements: Spouse/significant other (Lives with boyfriend and her 109 yr old daughter)  Can pt return to current living arrangement?: Yes Admission Status: Voluntary Is patient capable of signing voluntary admission?: Yes Transfer from: [facility] Referral Source: Self/Family/Friend (Girlfriend brought her in.)     Risk to self Suicidal Ideation: Yes-Currently Present Suicidal Intent: Yes-Currently Present Is patient at risk for suicide?: Yes Suicidal Plan?: Yes-Currently Present Specify Current Suicidal Plan: Overdose on Flexoril and Darvocet Access to Means: Yes Specify Access to Suicidal Means: Medications What has been your use of drugs/alcohol  within the last 12 months?: Daily marijuana use Previous Attempts/Gestures: Yes How many times?: 3  Other Self Harm Risks: N/A Triggers for Past Attempts: Family contact Intentional Self Injurious Behavior: None Family Suicide History: No Recent stressful life event(s): Loss (Comment) (Miscarriage & loss of mother in last year) Persecutory voices/beliefs?: Yes Depression: Yes Depression Symptoms: Despondent;Insomnia;Isolating;Loss of interest in usual pleasures;Feeling worthless/self pity Substance abuse history and/or treatment for substance abuse?: No Suicide prevention information given to non-admitted patients: Not applicable  Risk to Others Homicidal Ideation: No Thoughts of Harm to Others: No Current Homicidal Intent: No Current Homicidal Plan: No Access to Homicidal Means: No Identified Victim: No one History of harm to others?: No Assessment of Violence: None Noted Violent Behavior Description: No  Does patient have access to weapons?: No Criminal Charges Pending?: No Does patient have a court date: No  Psychosis Hallucinations: None noted Delusions: None noted  Mental Status Report Appear/Hygiene: Disheveled Eye Contact: Fair Motor Activity: Freedom of movement;Unremarkable Speech: Logical/coherent Level of Consciousness: Quiet/awake Mood: Depressed;Sad;Empty Affect: Depressed;Sad Anxiety Level: Panic Attacks Panic attack frequency: 1-2 per week Most recent panic attack: "The other day" Thought Processes: Coherent;Relevant Judgement: Impaired Orientation: Person;Place;Time;Situation Obsessive Compulsive Thoughts/Behaviors: None  Cognitive Functioning Concentration: Decreased Memory: Recent Impaired;Remote Intact IQ: Average Insight: Fair Impulse Control: Poor Appetite: Poor Weight Loss: 0  Weight Gain: 0  Sleep: Decreased Total Hours of Sleep:  (<6H/D) Vegetative Symptoms: Staying in bed  ADLScreening [facility]) Patient's cognitive  ability adequate to safely complete daily activities?: Yes Patient able to express need for assistance with ADLs?: Yes Independently performs ADLs?: Yes (appropriate for developmental age)  Abuse/Neglect [facility]) Physical Abuse: Denies Verbal Abuse: Denies Sexual Abuse: Denies  Prior Inpatient Therapy Prior Inpatient Therapy: Yes Prior Therapy Dates: Dec 2007 Prior Therapy Facilty/Provider(s): [facility] Reason for Treatment: SI  Prior Outpatient Therapy Prior Outpatient Therapy: Yes Prior Therapy Dates: 2 years ago Prior Therapy Facilty/Provider(s): Pt can't remember Reason for Treatment: SI  ADL Screening (condition at time of admission) Patient's cognitive ability adequate to safely complete daily activities?: Yes Patient able to express need for assistance with ADLs?: Yes Independently performs ADLs?: Yes (appropriate for developmental age) Weakness of Legs: None (Pt reports getting out of breath easily) Weakness of Arms/Hands: None  Home Assistive Devices/Equipment Home Assistive Devices/Equipment: None    Abuse/Neglect Assessment (Assessment to be complete while patient is alone) Physical Abuse: Denies Verbal Abuse: Denies Sexual Abuse: Denies Self-Neglect: Denies Values / Beliefs Cultural Requests During Hospitalization: None Spiritual Requests During Hospitalization: None   Advance Directives (For Healthcare) Advance Directive: Patient has advance directive, copy not in chart (Reports that the living will is with her father) Nutrition Screen- [facility] Adult/[facility]/AP Patient's home diet: Regular  Additional Information 1:1 In Past 12 Months?: No CIRT Risk: No Elopement Risk: No Does patient have medical clearance?: Yes (Pt has dialysis in AM on 02/05 at [facility])     Disposition:  Disposition Disposition of Patient: Inpatient treatment program;Referred to Type of inpatient treatment program: Adult Patient  referred to:  (Pt to be considered for [facility] after dialysis in AM on  02/05)  On Site Evaluation by:   Reviewed with Physician:  Dr. Lelan Pons Ray 04/20/2012 5:43 AM

## 2012-04-20 NOTE — ED Provider Notes (Signed)
The patient started to report that she no longer wanted to go to behavioral health. She indicated that she has been there before and it has not helped her. She was started to indicate that she was going to leave and therefore IVC paperwork was performed. Patient is felt to be a threat because she does have a suicide attempt by overdose.  Orpah Greek, MD 04/20/12 [phone]

## 2012-04-20 NOTE — ED Notes (Addendum)
Boyfriend (Emanual) in to visit- explained that pt was in dialysis, said he would return. Voiced questions concerning reasoning for patient's overdose. States has been with patient for 5 years, has never done anything like this before and hasn't said anything about being depressed lately. Wil[place] Mylar was his birthday. He will return after 12:30

## 2012-04-21 ENCOUNTER — Encounter (HOSPITAL_COMMUNITY): Payer: Self-pay | Admitting: Psychiatry

## 2012-04-21 DIAGNOSIS — F329 Major depressive disorder, single episode, unspecified: Secondary | ICD-10-CM | POA: Diagnosis present

## 2012-04-21 DIAGNOSIS — F1910 Other psychoactive substance abuse, uncomplicated: Secondary | ICD-10-CM

## 2012-04-21 MED ORDER — FLUOXETINE HCL 10 MG PO CAPS
10.0000 mg | ORAL_CAPSULE | Freq: Every day | ORAL | Status: DC
  Administered 2012-04-21 – 2012-04-25 (×5): 10 mg via ORAL
  Filled 2012-04-21 (×6): qty 1

## 2012-04-21 MED ORDER — CINACALCET HCL 30 MG PO TABS
60.0000 mg | ORAL_TABLET | Freq: Every day | ORAL | Status: DC
  Administered 2012-04-21 – 2012-04-25 (×5): 60 mg via ORAL
  Filled 2012-04-21 (×6): qty 2

## 2012-04-21 NOTE — Clinical Social Work Note (Addendum)
Writer contacted CPS to notify them that patient's 36 year old daughter had been missing since last night.  Patient had shared she left patient with a neighbor but she left the home and no one had been able to locate her. Writer spoke with Ayeshia at [place], [phone] who took a report.  Writer was asked to notify [facility] police department of missing child.  Writer notified police and advised mother that report had been made.  Mother notified of report and advised writer boyfriend had located her daughter and daughter is safe with him.  Police notified.

## 2012-04-21 NOTE — Progress Notes (Signed)
Psychoeducational Group Note  Date:  04/21/2012 Time:  1100  Group Topic/Focus:  Rediscovering Joy:   The focus of this group is to explore various ways to relieve stress in a positive manner.  Participation Level: Did Not Attend  Participation Quality:  Not Applicable  Affect:  Not Applicable  Cognitive:  Not Applicable  Insight:  Not Applicable  Engagement in Group: Not Applicable  Additional Comments: Patient did not attend group, patient remained in bed.  Abe People Brittini 04/21/2012, 6:34 PM

## 2012-04-21 NOTE — H&P (Signed)
Reviewed, Patient seen and assessed. Agree with need for inpatient stabilization. Patient upset about her daughter and would like to find her, very focused on this, minimizing her overdose.

## 2012-04-21 NOTE — Progress Notes (Signed)
Heeia 1:15 -2:30  04/21/2012 4:07 PM  Type of Therapy:  Group Therapy  Participation Level:  Patient left group shortly after it started stating she was not feeling well.  Concha Pyo 04/21/2012, 4:07 PM

## 2012-04-21 NOTE — Progress Notes (Signed)
Nutrition Brief Note  Patient identified on the Malnutrition Screening Tool (MST) Report  Body mass index is 29.62 kg/(m^2). Patient meets criteria for overweight based on current BMI.   Pt on dialysis and reports following a renal diet at home. Pt reports she meets with RD at dialysis center who helps her follow this diet. Pt reports 25 pound unintended weight loss in the past year which she attributes to poor appetite from being on dialysis. Pt reports she had some vomiting on admission and felt a little nauseated today and did not eat breakfast. Pt reports she did better at lunchtime. Pt reports she has been on Nepro nutrition supplement before and hates it. Pt does not want any nutritional supplements at this time. Pt states she will work on eating better during admission. Encouraged increased meal intake as pt feels better. No further nutrition intervention indicated at this time.   Mikey College MS, [place], [facility] Pager [phone] After Hours Pager

## 2012-04-21 NOTE — BHH Suicide Risk Assessment (Signed)
Suicide Risk Assessment  Admission Assessment     Nursing information obtained from:  Patient Demographic factors:  Unemployed Current Mental Status:  NA Loss Factors:  Decline in physical health Historical Factors:  Prior suicide attempts Risk Reduction Factors:  Living with another person, especially a relative;Positive social support;Positive therapeutic relationship  CLINICAL FACTORS:   Depression:   Anhedonia Hopelessness Impulsivity Severe  COGNITIVE FEATURES THAT CONTRIBUTE TO RISK:  Thought constriction (tunnel vision)    SUICIDE RISK:   Moderate:  Frequent suicidal ideation with limited intensity, and duration, some specificity in terms of plans, no associated intent, good self-control, limited dysphoria/symptomatology, some risk factors present, and identifiable protective factors, including available and accessible social support.  PLAN OF CARE: Initiate medications as needed.  Educate patient about depression/anxiety. Encourage attending groups.  I certify that inpatient services furnished can reasonably be expected to improve the patient's condition.  Chelcee Korpi 04/21/2012, 11:38 AM
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
## 2012-04-21 NOTE — Progress Notes (Addendum)
D:  Patient's self inventory sheet, pain has fair sleep, good appetite, normal energy level, good attention span.  "Just can't hear good."   Rated depression and hopelessness #3.   Denied anxiety.  Denied withdrawals.  Denied SI, contracts for safety.  Zero pain goal, worst pain #1.  "I need to get the people (family) that is around every day and tell them this is what I need to get better and they need to tell me what they expect.  My dad's sick.  I'm his power of attorney.  My daughter is somewhere I don't know if she went to school.  She's 36 yrs old.  My boyfriend is in jail."  Does have discharge plans.  No problems taking meds after discharge. A:  Staff will monitor every 15 minutes for safety.  Emotional support and encouragement given patient.  Scheduled medications administered per MD order. R:  Denied SI and HI.   Denied A/V hallucinations.  Patient remains safe on unit.  Will continue to monitor for safety.   1600  Patient stated she vomited small amount this afternoon.  Vital signs BP 124/84 P75, T98.8, R18 sitting;  Vital signs BP 116/83 P91, 97% room air.   Patient given ginger ale, resting in bed quietly, stated she is feeling better. Dialysis has been arranged for Friday 04/22/2012 at 0630 at [facility] will pick up patient at 0600 to transport patient to [facility].   [facility], [place] phone [phone].    [facility] [phone].   Patient continues to rest in bed, no complaints of pain or discomfort.

## 2012-04-21 NOTE — Progress Notes (Signed)
Patient ID: Desiree [name], female   DOB: Jul 25, 1975, 37 y.o.   MRN: [id number] D: Pt. In bed, no distress noted. A: Writer introduced self to client and offered sleep meds. R: Pt. Refused med, no other concerns at this time.

## 2012-04-22 ENCOUNTER — Ambulatory Visit (HOSPITAL_COMMUNITY)
Admission: AD | Admit: 2012-04-22 | Discharge: 2012-04-22 | Disposition: A | Discharge status: Home / Self Care | Payer: Medicare Other | Source: Ambulatory Visit | Attending: Psychiatry | Admitting: Psychiatry

## 2012-04-22 DIAGNOSIS — F411 Generalized anxiety disorder: Secondary | ICD-10-CM

## 2012-04-22 DIAGNOSIS — F329 Major depressive disorder, single episode, unspecified: Principal | ICD-10-CM

## 2012-04-22 LAB — RENAL FUNCTION PANEL
Albumin: 3.9 g/dL (ref 3.5–5.2)
CO2: 21 mEq/L (ref 19–32)
Chloride: 93 mEq/L — ABNORMAL LOW (ref 96–112)
Chloride: 94 mEq/L — ABNORMAL LOW (ref 96–112)
Creatinine, Ser: 13.97 mg/dL — ABNORMAL HIGH (ref 0.50–1.10)
GFR calc Af Amer: 3 mL/min — ABNORMAL LOW (ref >90)
GFR calc non Af Amer: 3 mL/min — ABNORMAL LOW (ref >90)
GFR calc non Af Amer: 6 mL/min — ABNORMAL LOW (ref >90)
Potassium: 4 mEq/L (ref 3.5–5.1)
Potassium: 4.6 mEq/L (ref 3.5–5.1)

## 2012-04-22 LAB — CBC
HCT: 33.6 % — ABNORMAL LOW (ref 36.0–46.0)
MCV: 95.4 fL (ref 78.0–100.0)
Platelets: 229 10*3/uL (ref 150–400)
Platelets: 237 10*3/uL (ref 150–400)
RBC: 3.27 MIL/uL — ABNORMAL LOW (ref 3.87–5.11)
RDW: 14.7 % (ref 11.5–15.5)
WBC: 6.7 10*3/uL (ref 4.0–10.5)
WBC: 7.5 10*3/uL (ref 4.0–10.5)

## 2012-04-22 MED ORDER — PENTAFLUOROPROP-TETRAFLUOROETH EX AERO
1.0000 "application " | INHALATION_SPRAY | CUTANEOUS | Status: DC | PRN
  Filled 2012-04-22: qty 103.5

## 2012-04-22 MED ORDER — NEPRO/CARBSTEADY PO LIQD: 237.0000 mL | ORAL | Status: DC | PRN

## 2012-04-22 MED ORDER — HEPARIN SODIUM (PORCINE) 1000 UNIT/ML DIALYSIS
20.0000 [IU]/kg | INTRAMUSCULAR | Status: DC | PRN
  Administered 2012-04-22: 1600 [IU] via INTRAVENOUS_CENTRAL
  Filled 2012-04-22: qty 2

## 2012-04-22 MED ORDER — SODIUM CHLORIDE 0.9 % IV SOLN
100.0000 mL | INTRAVENOUS | Status: DC | PRN
  Filled 2012-04-22: qty 100

## 2012-04-22 MED ORDER — HEPARIN SODIUM (PORCINE) 1000 UNIT/ML DIALYSIS
1000.0000 [IU] | INTRAMUSCULAR | Status: DC | PRN
  Filled 2012-04-22: qty 1

## 2012-04-22 MED ORDER — LIDOCAINE-PRILOCAINE 2.5-2.5 % EX CREA: 1.0000 "application " | TOPICAL_CREAM | CUTANEOUS | Status: DC | PRN

## 2012-04-22 MED ORDER — ALTEPLASE 2 MG IJ SOLR
2.0000 mg | Freq: Once | INTRAMUSCULAR | Status: AC | PRN
  Filled 2012-04-22: qty 2

## 2012-04-22 MED ORDER — RENA-VITE PO TABS
1.0000 | ORAL_TABLET | Freq: Every day | ORAL | Status: DC
  Administered 2012-04-22 – 2012-04-25 (×4): 1 via ORAL
  Filled 2012-04-22 (×5): qty 1

## 2012-04-22 MED ORDER — LIDOCAINE HCL (PF) 1 % IJ SOLN
5.0000 mL | INTRAMUSCULAR | Status: DC | PRN
  Filled 2012-04-22: qty 5
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
## 2012-04-22 NOTE — Progress Notes (Signed)
[facility] MD Progress Note  04/22/2012 1:53 PM Desiree [name]  MRN:  [id number] Subjective:  Patient returned from dialysis feeling "tired", according to the patient they had to take off "a lot of fluid" because they did not know her dry weight, ended up taking off too much and her blood pressure dropped and she had to have fluids and sit for an hour to stabilize, denies depression and suicidal thoughts, "slight" anxiety because she wants to go home, reports she "slept well" but appetite not "good" due to dialysis issues Diagnosis:   Axis I: Anxiety Disorder NOS and Depressive Disorder NOS Axis II: Deferred Axis III:  Past Medical History  Diagnosis Date  . Blood transfusion   . Pregnancy induced hypertension   . Preterm labor   . Dialysis patient   . Chronic kidney disease    Axis IV: other psychosocial or environmental problems, problems related to social environment and problems with primary support group Axis V: 41-50 serious symptoms  ADL's:  Intact  Sleep: Good  Appetite:  Poor  Suicidal Ideation:  Denies Homicidal Ideation:  Denies  Psychiatric Specialty Exam: Review of Systems  Constitutional: Positive for malaise/fatigue.  HENT: Negative.   Eyes: Negative.   Respiratory: Negative.   Cardiovascular: Negative.   Gastrointestinal: Negative.   Genitourinary: Negative.   Musculoskeletal: Negative.   Skin: Negative.   Neurological: Negative.   Endo/Heme/Allergies: Negative.   Psychiatric/Behavioral: The patient is nervous/anxious.     Blood pressure 131/83, pulse 114, temperature 99 F (37.2 C), temperature source Oral, resp. rate 20, height 5\' 5"  (1.651 m), weight 82.555 kg (182 lb), last menstrual period 04/10/2012, SpO2 99.00%.Body mass index is 30.29 kg/(m^2).  General Appearance: Casual  Eye Contact::  Fair  Speech:  Normal Rate  Volume:  Normal  Mood:  Anxious  Affect:  Congruent  Thought Process:  Coherent  Orientation:  Full (Time, Place, and Person)   Thought Content:  WDL  Suicidal Thoughts:  No  Homicidal Thoughts:  No  Memory:  Immediate;   Fair Recent;   Fair Remote;   Fair  Judgement:  Fair  Insight:  Fair  Psychomotor Activity:  Decreased  Concentration:  Fair  Recall:  Fair  Akathisia:  No  Handed:  Right  AIMS (if indicated):     Assets:  Communication Skills Desire for Improvement Housing Resilience Social Support  Sleep:  Number of Hours: 6.5    Current Medications: Current Facility-Administered Medications  Medication Dose Route Frequency Provider Last Rate Last Dose  . 0.9 %  sodium chloride infusion  100 mL Intravenous PRN Sherril Croon, MD      . 0.9 %  sodium chloride infusion  100 mL Intravenous PRN Sherril Croon, MD      . acetaminophen (TYLENOL) tablet 650 mg  650 mg Oral Q6H PRN Laverle Hobby, PA      . alteplase (CATHFLO ACTIVASE) injection 2 mg  2 mg Intracatheter Once PRN Sherril Croon, MD      . calcium acetate (PHOSLO) capsule 667 mg  667 mg Oral TID WC Laverle Hobby, PA   667 mg at 04/22/12 1220  . cinacalcet (SENSIPAR) tablet 60 mg  60 mg Oral Daily Waylan Boga, NP   60 mg at 04/22/12 1219  . feeding supplement (NEPRO CARB STEADY) liquid 237 mL  237 mL Oral PRN Sherril Croon, MD      . FLUoxetine (PROZAC) capsule 10 mg  10 mg Oral Daily Waylan Boga,  NP   10 mg at 04/22/12 1219  . heparin injection 1,000 Units  1,000 Units Dialysis PRN Sherril Croon, MD      . heparin injection 1,600 Units  20 Units/kg Dialysis PRN Sherril Croon, MD   1,600 Units at 04/22/12 [phone]  . hydrOXYzine (ATARAX/VISTARIL) tablet 12.5 mg  12.5 mg Oral Q6H PRN Laverle Hobby, PA      . hydrOXYzine (ATARAX/VISTARIL) tablet 25 mg  25 mg Oral QHS,MR X 1 Spencer E Simon, PA      . lidocaine (XYLOCAINE) 1 % injection 5 mL  5 mL Intradermal PRN Sherril Croon, MD      . lidocaine-prilocaine (EMLA) cream 1 application  1 application Topical PRN Sherril Croon, MD      . multivitamin (RENA-VIT) tablet 1 tablet  1 tablet Oral Daily  Placido Sou, MD   1 tablet at 04/22/12 1219  . pantoprazole (PROTONIX) EC tablet 40 mg  40 mg Oral Daily Laverle Hobby, PA   40 mg at 04/22/12 1219  . pentafluoroprop-tetrafluoroeth (GEBAUERS) aerosol 1 application  1 application Topical PRN Sherril Croon, MD        Lab Results:  Results for orders placed during the hospital encounter of 04/22/12 (from the past 48 hour(s))  CBC     Status: Abnormal   Collection Time   04/22/12  6:45 AM      Component Value Range Comment   WBC 6.7  4.0 - 10.5 K/uL    RBC 3.27 (*) 3.87 - 5.11 MIL/uL    Hemoglobin 10.4 (*) 12.0 - 15.0 g/dL    HCT 31.2 (*) 36.0 - 46.0 %    MCV 95.4  78.0 - 100.0 fL    MCH 31.8  26.0 - 34.0 pg    MCHC 33.3  30.0 - 36.0 g/dL    RDW 14.7  11.5 - 15.5 %    Platelets 229  150 - 400 K/uL   RENAL FUNCTION PANEL     Status: Abnormal   Collection Time   04/22/12  6:45 AM      Component Value Range Comment   Sodium 135  135 - 145 mEq/L    Potassium 4.0  3.5 - 5.1 mEq/L    Chloride 93 (*) 96 - 112 mEq/L    CO2 21  19 - 32 mEq/L    Glucose, Bld 134 (*) 70 - 99 mg/dL    BUN 45 (*) 6 - 23 mg/dL DIALYSIS   Creatinine, Ser 13.97 (*) 0.50 - 1.10 mg/dL DIALYSIS   Calcium 9.5  8.4 - 10.5 mg/dL    Phosphorus 4.7 (*) 2.3 - 4.6 mg/dL    Albumin 3.9  3.5 - 5.2 g/dL    GFR calc non Af Amer 3 (*) >90 mL/min    GFR calc Af Amer 3 (*) >90 mL/min     Physical Findings: AIMS: Facial and Oral Movements Muscles of Facial Expression: None, normal Lips and Perioral Area: None, normal Jaw: None, normal Tongue: None, normal,Extremity Movements Upper (arms, wrists, hands, fingers): None, normal Lower (legs, knees, ankles, toes): None, normal, Trunk Movements Neck, shoulders, hips: None, normal, Overall Severity Severity of abnormal movements (highest score from questions above): None, normal Incapacitation due to abnormal movements: None, normal Patient's awareness of abnormal movements (rate only patient's report): No Awareness, Dental  Status Current problems with teeth and/or dentures?: No Does patient usually wear dentures?: No  CIWA:  CIWA-Ar Total: 0  COWS:  COWS Total Score: 0   Treatment Plan Summary: Daily contact with patient to assess and evaluate symptoms and progress in treatment Medication management  Plan:  Review of chart, vital signs, medications, and notes. 1-Individual and group therapy 2-Medication management for depression and anxiety:  Medications reviewed with the patient and she stated no untoward effects 3-Coping skills for depression and anxiety 4-Continue crisis stabilization and management 5-Address health issues--monitoring vital signs, stable with elevation after dialysis, most likely due to fluid addition while there, rest encouraged after group 6-Treatment plan in progress to prevent relapse of depression and anxiety  Medical Decision Making Problem Points:  Established problem, stable/improving (1) and Review of psycho-social stressors (1) Data Points:  Review of medication regiment & side effects (2)  I certify that inpatient services furnished can reasonably be expected to improve the patient's condition.   Waylan Boga, [place] 04/22/2012, 1:53 PM

## 2012-04-22 NOTE — Progress Notes (Signed)
D) Pt returning from dialysis and stating that she was very tired. Weight is 182. Pt's boyfriend on the unit and wanting to speak with the doctor because boyfriend wants her to come home so they can celebrate Pt's birthday that was yesterday. Affect and mood are appropriate. Denies SI and HI A) Given support and reassurance along with praise. Encouraged to attend the groups and to speak with her doctor about possible discharge tomorrow. R) Denies SI and HI.

## 2012-04-22 NOTE — Progress Notes (Signed)
[facility] LCSW Aftercare Discharge Planning Group Note  04/22/2012 2:49 PM  Participation Quality:  Patient did not attend group due to dialysis.   Concha Pyo 04/22/2012, 2:49 PM

## 2012-04-22 NOTE — Progress Notes (Signed)
Patient ID: Desiree [name], female   DOB: [birth year], 37 y.o.   MRN: [id number] D: Pt. In bed, but very talkative, tells me about dialysis and how she is the only one that can access their fistula at the clinic. Pt. AVF positive for thrill and bruit, no drainage at site. A: Writer reviewed pt. Is for dialysis in am will wake her at least by 0530, pt. To be at dialysis to run at 0630. Staff will monitor q51min for safety. Writer encouraged group. R: Pt. Is safe on the unit. Pt. Did not attend karaoke.

## 2012-04-22 NOTE — Progress Notes (Signed)
[facility] LCSW Group Therapy  04/22/2012 2:50 PM  Type of Therapy:  Group Therapy  Participation Level:  Minimal  Participation Quality:  Appropriate, Attentive and Drowsy  Affect:  Appropriate, Depressed and Flat  Cognitive:  Appropriate  Insight:  Limited  Engagement in Therapy:  Limited  Modes of Intervention:  Discussion, Education, Exploration, Problem-solving, Rapport Building and Support  Summary of Progress/Problems:  Patient shared she was drained due to having completed dialysis but wanted to remain in groups.  She shared she tends to relapse when she fails to speak up for herself.  Patient shared she allows things to build up and then explodes.  Patient was encouraged to be more assertive by speaking up for her needs,  Concha Pyo 04/22/2012, 2:50 PM

## 2012-04-22 NOTE — Progress Notes (Signed)
Psychoeducational Group Note  Date:  04/22/2012 Time: 1000  Group Topic/Focus:  Therapeutic Activity-recovery goals  Participation Level: Did Not Attend  Participation Quality:  Not Applicable  Affect:  Not Applicable  Cognitive:  Not Applicable  Insight:  Not Applicable  Engagement in Group: Not Applicable  Additional Comments: Patient did not attend group, patient was off the unit.   Desiree [name] Desiree [name] 04/22/2012, 7:00 PM

## 2012-04-22 NOTE — Procedures (Signed)
I was present at this session.  I have reviewed the session itself and made appropriate changes. Bp ok. Needles both up and too close per P&P Andreya Lacks L 2/7/20148:11 AM

## 2012-04-22 NOTE — Tx Team (Signed)
Interdisciplinary Treatment Plan Update (Adult)  Date:  04/22/2012  Time Reviewed:  9:49 AM   Progress in Treatment: Attending groups:   Yes   Participating in groups:  Yes Taking medication as prescribed:  Yes Tolerating medication:  Yes Family/Significant othe contact made: Contact to be made with family Patient understands diagnosis:  Yes Discussing patient identified problems/goals with staff: Yes Medical problems stabilized or resolved: Yes Denies suicidal/homicidal ideation:Yes Issues/concerns per patient self-inventory:  Other:   New problem(s) identified:  Reason for Continuation of Hospitalization: Anxiety Depression Medication stabilization Suicidal ideation  Interventions implemented related to continuation of hospitalization:  Medication Management; safety checks q 15 mins  Additional comments:  Estimated length of stay:  2-3 days  Discharge Plan:  Home with outpatient follow up  New goal(s):  Review of initial/current patient goals per problem list:    1.  Goal(s): Eliminate SI/other thoughts of self harm (Patient will no longer endorse SI/HI or thoughts of self harm)   Met:  Yes  Target date: d/c  As evidenced by: Patient no longer endorses SI/HI or other thoughts of self harm.    2.  Goal (s):Reduce depression/anxiety (Paitent will rate symptoms at four or below)  Met: Yes  Target date: d/c  As evidenced by: Patient did not rate symptoms today    3.  Goal(s):.stabilize on meds (Patient will report being stabilized on medications)   Met:  No  Target date: d/c  As evidenced by: Patient not reporting being stable on medications    4.  Goal(s): Refer for outpatient follow up (Follow up appointment will be scheduled)   Met:  No  Target date: d/c  As evidenced by: Follow up appointment to be scheduled    Attendees: Patient:   04/22/2012 9:49 AM  Physican:  Elvin So, MD 04/22/2012 9:49 AM  Nursing:  Drake Leach, RN 04/22/2012  9:49 AM   Nursing:   Eduard Roux, RN 04/22/2012 9:49 AM   Clinical Social Worker:  Joette Catching, LCSW 04/22/2012 9:49 AM   Other: Mickie Bail, PHM-NP 04/22/2012 9:49 AM   Other:  Ronald Lobo, RT     04/22/2012 9:49 AM Other: Kendra Opitz, LPC, Intern     04/22/2012 9:49 AM

## 2012-04-22 NOTE — Progress Notes (Signed)
Patient ID: Desiree [name], female   DOB: 05/14/1975, 37 y.o.   MRN: [id number] Report given to dialysis nurse Caryl Pina. Pt. Prepared for dialysis, no complaints at this time transported by security accompanied by Melissa, MHT. No discomfort or distress at this time. Pt. Refused med citing no breakfast.

## 2012-04-22 NOTE — Progress Notes (Signed)
Subjective: Interval History: none.  Objective: Vital signs in last 24 hours: Temp:  [98.8 F (37.1 C)-99.1 F (37.3 C)] 99.1 F (37.3 C) (02/07 0650) Pulse Rate:  [75-97] 97  (02/07 0800) Resp:  [16-20] 20  (02/07 0650) BP: (100-138)/(54-84) 118/57 mmHg (02/07 0800) SpO2:  [94 %-97 %] 97 % (02/07 0650) Weight:  [82.3 kg (181 lb 7 oz)] 82.3 kg (181 lb 7 oz) (02/07 0650) Weight change: 1.56 kg (3 lb 7 oz)  Intake/Output from previous day:   Intake/Output this shift:    General appearance: alert, cooperative and no distress Resp: clear to auscultation bilaterally Cardio: S1, S2 normal and systolic murmur: holosystolic 2/6, blowing at apex GI: obese,pos bs,soft, liver at [facility] Extremities: avf LLa  Lab Results:  [facility] 04/19/12 1512  WBC 6.1  HGB 10.5*  HCT 31.3*  PLT 225   BMET:  Basename 04/20/12 1332 04/19/12 1512  NA 141 136  K 3.7 4.6  CL 99 96  CO2 29 19  GLUCOSE 112* 95  BUN 19 57*  CREATININE 7.74* 16.89*  CALCIUM 10.1 9.6   No results found for this basename: PTH:2 in the last 72 hours Iron Studies: No results found for this basename: IRON,TIBC,TRANSFERRIN,FERRITIN in the last 72 hours  Studies/Results: No results found.  I have reviewed the patient's current medications.  Assessment/Plan: 1 CRF for HD need to clear up status and billing 2 Anemia stable 3 HPTH cinnacalcet  4 Depression P HD, epo cinnacalcet   LOS: 2 days   Omnia Dollinger L 04/22/2012,8:06 AM

## 2012-04-23 MED ORDER — DIPHENHYDRAMINE HCL 25 MG PO CAPS
25.0000 mg | ORAL_CAPSULE | Freq: Every evening | ORAL | Status: DC | PRN
  Administered 2012-04-23 – 2012-04-24 (×2): 25 mg via ORAL

## 2012-04-23 NOTE — Progress Notes (Signed)
Patient ID: Desiree [name], female   DOB: 1975/06/28, 37 y.o.   MRN: [id number] [facility] MD Progress Note  04/23/2012 12:11 PM Natsuko KANYAH DORY  MRN:  [id number] Subjective:  Diagnosis:  Asking for discharge says she feels fine. To have Dialysis again Monday and has a 2 pm appointment regarding a kidney transplant.   Axis I: Anxiety Disorder NOS and Depressive Disorder NOS Axis II: Deferred Axis III:  Past Medical History  Diagnosis Date  . Blood transfusion   . Pregnancy induced hypertension   . Preterm labor   . Dialysis patient   . Chronic kidney disease    Axis IV: other psychosocial or environmental problems, problems related to social environment and problems with primary support group Axis V: 41-50 serious symptoms  ADL's:  Intact  Sleep: Good  Appetite:  Poor  Suicidal Ideation:  Denies Homicidal Ideation:  Denies  Psychiatric Specialty Exam: Review of Systems  Constitutional: Positive for malaise/fatigue.  HENT: Negative.   Eyes: Negative.   Respiratory: Negative.   Cardiovascular: Negative.   Gastrointestinal: Negative.   Genitourinary: Negative.   Musculoskeletal: Negative.   Skin: Negative.   Neurological: Negative.   Endo/Heme/Allergies: Negative.   Psychiatric/Behavioral: The patient is nervous/anxious.     Blood pressure 99/70, pulse 106, temperature 98.8 F (37.1 C), temperature source Oral, resp. rate 18, height 5\' 5"  (1.651 m), weight 82.555 kg (182 lb), last menstrual period 04/10/2012, SpO2 99.00%.Body mass index is 30.29 kg/(m^2).  General Appearance: Casual  Eye Contact::  Fair  Speech:  Normal Rate  Volume:  Normal  Mood:  Anxious  Affect:  Congruent  Thought Process:  Coherent  Orientation:  Full (Time, Place, and Person)  Thought Content:  WDL  Suicidal Thoughts:  No  Homicidal Thoughts:  No  Memory:  Immediate;   Fair Recent;   Fair Remote;   Fair  Judgement:  Fair  Insight:  Fair  Psychomotor Activity:  Decreased  Concentration:  Fair   Recall:  Fair  Akathisia:  No  Handed:  Right  AIMS (if indicated):     Assets:  Communication Skills Desire for Improvement Housing Resilience Social Support  Sleep:  Number of Hours: 5.75   Current Medications: Current Facility-Administered Medications  Medication Dose Route Frequency Provider Last Rate Last Dose  . 0.9 %  sodium chloride infusion  100 mL Intravenous PRN Sherril Croon, MD      . 0.9 %  sodium chloride infusion  100 mL Intravenous PRN Sherril Croon, MD      . acetaminophen (TYLENOL) tablet 650 mg  650 mg Oral Q6H PRN Laverle Hobby, PA      . calcium acetate (PHOSLO) capsule 667 mg  667 mg Oral TID WC Laverle Hobby, PA   667 mg at 04/23/12 1201  . cinacalcet (SENSIPAR) tablet 60 mg  60 mg Oral Daily Waylan Boga, NP   60 mg at 04/23/12 0905  . feeding supplement (NEPRO CARB STEADY) liquid 237 mL  237 mL Oral PRN Sherril Croon, MD      . FLUoxetine (PROZAC) capsule 10 mg  10 mg Oral Daily Waylan Boga, NP   10 mg at 04/23/12 0905  . heparin injection 1,000 Units  1,000 Units Dialysis PRN Sherril Croon, MD      . heparin injection 1,600 Units  20 Units/kg Dialysis PRN Sherril Croon, MD   1,600 Units at 04/22/12 [phone]  . hydrOXYzine (ATARAX/VISTARIL) tablet 12.5 mg  12.5 mg Oral  Q6H PRN Laverle Hobby, PA      . hydrOXYzine (ATARAX/VISTARIL) tablet 25 mg  25 mg Oral QHS,MR X 1 Laverle Hobby, PA   25 mg at 04/22/12 2211  . lidocaine (XYLOCAINE) 1 % injection 5 mL  5 mL Intradermal PRN Sherril Croon, MD      . lidocaine-prilocaine (EMLA) cream 1 application  1 application Topical PRN Sherril Croon, MD      . multivitamin (RENA-VIT) tablet 1 tablet  1 tablet Oral Daily Placido Sou, MD   1 tablet at 04/23/12 0905  . pantoprazole (PROTONIX) EC tablet 40 mg  40 mg Oral Daily Laverle Hobby, PA   40 mg at 04/23/12 0905  . pentafluoroprop-tetrafluoroeth (GEBAUERS) aerosol 1 application  1 application Topical PRN Sherril Croon, MD        Lab Results:  Results for  orders placed during the hospital encounter of 04/20/12 (from the past 48 hour(s))  CBC     Status: Abnormal   Collection Time    04/22/12  7:35 PM      Result Value Range   WBC 7.5  4.0 - 10.5 K/uL   RBC 3.49 (*) 3.87 - 5.11 MIL/uL   Hemoglobin 11.2 (*) 12.0 - 15.0 g/dL   HCT 33.6 (*) 36.0 - 46.0 %   MCV 96.3  78.0 - 100.0 fL   MCH 32.1  26.0 - 34.0 pg   MCHC 33.3  30.0 - 36.0 g/dL   RDW 14.7  11.5 - 15.5 %   Platelets 237  150 - 400 K/uL  RENAL FUNCTION PANEL     Status: Abnormal   Collection Time    04/22/12  7:35 PM      Result Value Range   Sodium 137  135 - 145 mEq/L   Potassium 4.6  3.5 - 5.1 mEq/L   Chloride 94 (*) 96 - 112 mEq/L   CO2 29  19 - 32 mEq/L   Glucose, Bld 95  70 - 99 mg/dL   BUN 14  6 - 23 mg/dL   Creatinine, Ser 7.35 (*) 0.50 - 1.10 mg/dL   Calcium 9.0  8.4 - 10.5 mg/dL   Phosphorus 2.4  2.3 - 4.6 mg/dL   Albumin 3.9  3.5 - 5.2 g/dL   GFR calc non Af Amer 6 (*) >90 mL/min   GFR calc Af Amer 7 (*) >90 mL/min   Comment:            The eGFR has been calculated     using the CKD EPI equation.     This calculation has not been     validated in all clinical     situations.     eGFR's persistently     <90 mL/min signify     possible Chronic Kidney Disease.    Physical Findings: AIMS: Facial and Oral Movements Muscles of Facial Expression: None, normal Lips and Perioral Area: None, normal Jaw: None, normal Tongue: None, normal,Extremity Movements Upper (arms, wrists, hands, fingers): None, normal Lower (legs, knees, ankles, toes): None, normal, Trunk Movements Neck, shoulders, hips: None, normal, Overall Severity Severity of abnormal movements (highest score from questions above): None, normal Incapacitation due to abnormal movements: None, normal Patient's awareness of abnormal movements (rate only patient's report): No Awareness, Dental Status Current problems with teeth and/or dentures?: No Does patient usually wear dentures?: No  CIWA:  CIWA-Ar  Total: 0 COWS:  COWS Total Score: 0  Treatment Plan Summary:  Daily contact with patient to assess and evaluate symptoms and progress in treatment Medication management  Plan:  Review of chart, vital signs, medications, and notes. 1-Individual and group therapy 2-Medication management for depression and anxiety:  Medications reviewed with the patient and she stated no untoward effects 3-Coping skills for depression and anxiety 4-Continue crisis stabilization and management 5-Address health issues--monitoring vital signs, stable with elevation after dialysis, most likely due to fluid addition while there, rest encouraged after group 6-Treatment plan in progress to prevent relapse of depression and anxiety  Medical Decision Making Problem Points:  Established problem, stable/improving (1) and Review of psycho-social stressors (1) Data Points:  Review of medication regiment & side effects (2)  I certify that inpatient services furnished can reasonably be expected to improve the patient's condition.   Lazarius Rivkin,MICKIE D.RPA-C CAQ-Psych 04/23/2012, 12:11 PM

## 2012-04-23 NOTE — Progress Notes (Signed)
Psychoeducational Group Note  Date: 04/23/2012 Time:  1015  Group Topic/Focus:  Identifying Needs:   The focus of this group is to help patients identify their personal needs that have been historically problematic and identify healthy behaviors to address their needs.  Participation Level:  Active  Participation Quality:  Appropriate  Affect:  Flat  Cognitive:  Appropriate  Insight:  Engaged  Engagement in Group:  Engaged  Additional Comments:    Paulino Rily

## 2012-04-23 NOTE — Clinical Social Work Note (Signed)
[facility] Group Notes:  (Clinical Social Work)  04/23/2012   3:00-4:00PM  Summary of Progress/Problems:   The main focus of today's process group was for the patient to identify ways in which they have in the past sabotaged their own recovery and to discuss their motivation to change, as well as confidence that they can change. Cognitive Behavioral Therapy concepts about the interconnectedness of thoughts/feelings/actions was introduced, along with 2 short techniques of Thought Stopping and Thought Replacement.  The patient expressed that she feels like an Oreo cookie with everybody in her life crowding in on her and squeezing out her middle.  She agreed that her negative thought that led to her overdose was "I've taken as much as I can take."  She stated her motivation to change is 5-6 out of 10, and stated that she has to really work to make everybody understand that her overdose was nobody's fault.  The group told her that this was her trying once again to be a people pleaser.  We then looked at how she could take care of herself even if the people in her life choose not to believe that her overdose was not their fault.  Type of Therapy:  Process Group, Cognitive Behavior Therapy, Motivational Interviewing  Participation Level:  Active  Participation Quality:  Attentive and Sharing  Affect:  Depressed, Flat and Irritable  Cognitive:  Oriented  Insight:  Developing/Improving  Engagement in Therapy:  Developing/Improving  Modes of Intervention:  Clarification, Education, Limit-setting, Problem-solving, Socialization, Support and Processing, Exploration, Discussion   Selmer Dominion, LCSW 04/23/2012, 5:41 PM

## 2012-04-23 NOTE — Progress Notes (Signed)
  GOALS GROUP  The focus of this group is to help patients establish daily goals to achieve during treatment and discuss how the patient can incorporate goal setting into their daily lives to aide in recovery.   Date: 04/23/2012 Time:  0900  Participation Level:  Active  Participation Quality:  Attentive  Affect:  Appropriate  Cognitive:  Appropriate  Insight:  Engaged  Engagement in Group:  Engaged  Additional Comments:    Paulino Rily

## 2012-04-23 NOTE — Progress Notes (Signed)
D) Pt rates her depression and hopelessness both at a 1. Denies SI and HI. In group however Pt rated her internal energy level at a 3. Has attended the groups and interacts with her peers appropriately. States she feels no thoughts or desires of hurting herself. States that she sometimes feels discouraged by the dialysis and always feels tired that day of her treatment. A) given support, reassurance and praise. Encouraged to continue to work on her issues of dealing with her chronic condition. R) Affect is spontaneous and less intense than yesterday.

## 2012-04-23 NOTE — Progress Notes (Signed)
D: Pt in bed resting with eyes closed. Respirations even and unlabored. Pt appears to be in no signs of distress at this time. A: Q15min checks remains for this pt. R: Pt remains safe at this time.   

## 2012-04-24 NOTE — Progress Notes (Signed)
Psychoeducational Group Note  Date:  04/24/2012 Time:  1015  Group Topic/Focus:  Making Healthy Choices:   The focus of this group is to help patients identify negative/unhealthy choices they were using prior to admission and identify positive/healthier coping strategies to replace them upon discharge.  Participation Level:  Did Not Attend  Desiree [name] 04/24/2012

## 2012-04-24 NOTE — Progress Notes (Addendum)
D) Pt upset  over the fact that she will not be discharged today. Refused to go to the morning group because "if they (menaing the PA) won't cooperate with me, I will not cooperate with them. I am not going to go to the groups". Rates her depression at a 7 her hopelessness at a 2 and Denies SI and HI. A) Pt encouraged to come to the groups and to do what she can for herself while she has the opportunity.  Given support.  R) Pt denies SI and HI.  Continues to refused to go to group.

## 2012-04-24 NOTE — Progress Notes (Signed)
Writer spoke with patient who reports having had a better day. Patient has been up and active on the unit, interacting appropriately with peers. Patient reports that she did not sleep well last night and the visteril did not work well for her. Patient reports that she normally takes benedryl at home to aid with sleep. Writer informed patient that the doctor on call would be notified and obtain an order for benadryl. Patient was pleased and reports that benadryl also help with her itching she experiences from dailysis. Patient currently denies si/hi/a/v hallucinations, safety maintained on unit, will continue to monitor.

## 2012-04-24 NOTE — Progress Notes (Signed)
Writer spoke with patient at change of shift and she was not happy because she thought she was being discharged earlier today. Patient reports that she has a dialysis appointment in the am and a 2 pm appt for a kidney transplant and patient is concerned she will miss her transplant appt. Patient encouraged to speak with her doctor in the am concerning her appt. Patient reports that she did not attend groups earlier today because she was angry. Patient offered support and encouragement. Patient denies si/hi/a/v hallucinations. Safety maintained on unit with 15 min checks will continue to monitor.

## 2012-04-24 NOTE — Clinical Social Work Note (Signed)
[place] [facility] Group Notes: (Clinical Social Work)   04/24/2012      Type of Therapy:  Group Therapy   Participation Level:  Did Not Attend    Selmer Dominion, LCSW 04/24/2012, 5:07 PM

## 2012-04-24 NOTE — Progress Notes (Signed)
Psychoeducational Group Note  Date:  04/24/2012 Time:  1015  Group Topic/Focus:  Making Healthy Choices:   The focus of this group is to help patients identify negative/unhealthy choices they were using prior to admission and identify positive/healthier coping strategies to replace them upon discharge.  Participation Level:  Active  Participation Quality:  Appropriate  Affect:  Appropriate  Cognitive:  Appropriate  Insight:  Improving  Engagement in Group:  Improving  Additional Comments:    Paulino Rily 04/24/2012

## 2012-04-24 NOTE — Progress Notes (Signed)
Patient ID: Desiree [name], female   DOB: 11-Sep-1975, 37 y.o.   MRN: [id number] [facility] MD Progress Note  04/24/2012 9:47 AM Desiree [name]  MRN:  [id number] Subjective:  Bright cheerful asking for discharge. Our weekend counselor reviewed and could find no discharge Plans. Will need to send to dialysis in am like we did Friday. Patient feels she an appointment at 2pm Monday to discuss a transplant. This appointment needs to be verified in light of her suicide attempt.    Diagnosis:  Axis I: Anxiety Disorder NOS and Depressive Disorder NOS Axis II: Deferred Axis III:  Past Medical History  Diagnosis Date  . Blood transfusion   . Pregnancy induced hypertension   . Preterm labor   . Dialysis patient   . Chronic kidney disease    Axis IV: other psychosocial or environmental problems, problems related to social environment and problems with primary support group Axis V: 41-50 serious symptoms  ADL's:  Intact  Sleep: Good  Appetite:  Poor  Suicidal Ideation:  Denies Homicidal Ideation:  Denies  Psychiatric Specialty Exam: Review of Systems  Constitutional: Positive for malaise/fatigue.  HENT: Negative.   Eyes: Negative.   Respiratory: Negative.   Cardiovascular: Negative.   Gastrointestinal: Negative.   Genitourinary: Negative.   Musculoskeletal: Negative.   Skin: Negative.   Neurological: Negative.   Endo/Heme/Allergies: Negative.   Psychiatric/Behavioral: The patient is nervous/anxious.     Blood pressure 117/80, pulse 86, temperature 98.2 F (36.8 C), temperature source Oral, resp. rate 18, height 5\' 5"  (1.651 m), weight 82.555 kg (182 lb), last menstrual period 04/10/2012, SpO2 99.00%.Body mass index is 30.29 kg/(m^2).  General Appearance: Casual  Eye Contact::  Fair  Speech:  Normal Rate  Volume:  Normal  Mood:  Anxious  Affect:  Congruent  Thought Process:  Coherent  Orientation:  Full (Time, Place, and Person)  Thought Content:  WDL  Suicidal Thoughts:  No   Homicidal Thoughts:  No  Memory:  Immediate;   Fair Recent;   Fair Remote;   Fair  Judgement:  Fair  Insight:  Fair  Psychomotor Activity:  Decreased  Concentration:  Fair  Recall:  Fair  Akathisia:  No  Handed:  Right  AIMS (if indicated):     Assets:  Communication Skills Desire for Improvement Housing Resilience Social Support  Sleep:  Number of Hours: 6.5   Current Medications: Current Facility-Administered Medications  Medication Dose Route Frequency Provider Last Rate Last Dose  . 0.9 %  sodium chloride infusion  100 mL Intravenous PRN Sherril Croon, MD      . 0.9 %  sodium chloride infusion  100 mL Intravenous PRN Sherril Croon, MD      . acetaminophen (TYLENOL) tablet 650 mg  650 mg Oral Q6H PRN Laverle Hobby, PA   650 mg at 04/23/12 2116  . calcium acetate (PHOSLO) capsule 667 mg  667 mg Oral TID WC Laverle Hobby, PA   667 mg at 04/24/12 [id number]  . cinacalcet (SENSIPAR) tablet 60 mg  60 mg Oral Daily Waylan Boga, NP   60 mg at 04/24/12 0845  . diphenhydrAMINE (BENADRYL) capsule 25 mg  25 mg Oral QHS PRN Encarnacion Slates, NP   25 mg at 04/23/12 2150  . feeding supplement (NEPRO CARB STEADY) liquid 237 mL  237 mL Oral PRN Sherril Croon, MD      . FLUoxetine (PROZAC) capsule 10 mg  10 mg Oral Daily Waylan Boga, NP  10 mg at 04/24/12 0845  . heparin injection 1,000 Units  1,000 Units Dialysis PRN Sherril Croon, MD      . heparin injection 1,600 Units  20 Units/kg Dialysis PRN Sherril Croon, MD   1,600 Units at 04/22/12 [phone]  . hydrOXYzine (ATARAX/VISTARIL) tablet 12.5 mg  12.5 mg Oral Q6H PRN Laverle Hobby, PA      . lidocaine (XYLOCAINE) 1 % injection 5 mL  5 mL Intradermal PRN Sherril Croon, MD      . lidocaine-prilocaine (EMLA) cream 1 application  1 application Topical PRN Sherril Croon, MD      . multivitamin (RENA-VIT) tablet 1 tablet  1 tablet Oral Daily Placido Sou, MD   1 tablet at 04/24/12 0845  . pantoprazole (PROTONIX) EC tablet 40 mg  40 mg Oral Daily  Laverle Hobby, PA   40 mg at 04/24/12 0845  . pentafluoroprop-tetrafluoroeth (GEBAUERS) aerosol 1 application  1 application Topical PRN Sherril Croon, MD        Lab Results:  Results for orders placed during the hospital encounter of 04/20/12 (from the past 48 hour(s))  CBC     Status: Abnormal   Collection Time    04/22/12  7:35 PM      Result Value Range   WBC 7.5  4.0 - 10.5 K/uL   RBC 3.49 (*) 3.87 - 5.11 MIL/uL   Hemoglobin 11.2 (*) 12.0 - 15.0 g/dL   HCT 33.6 (*) 36.0 - 46.0 %   MCV 96.3  78.0 - 100.0 fL   MCH 32.1  26.0 - 34.0 pg   MCHC 33.3  30.0 - 36.0 g/dL   RDW 14.7  11.5 - 15.5 %   Platelets 237  150 - 400 K/uL  RENAL FUNCTION PANEL     Status: Abnormal   Collection Time    04/22/12  7:35 PM      Result Value Range   Sodium 137  135 - 145 mEq/L   Potassium 4.6  3.5 - 5.1 mEq/L   Chloride 94 (*) 96 - 112 mEq/L   CO2 29  19 - 32 mEq/L   Glucose, Bld 95  70 - 99 mg/dL   BUN 14  6 - 23 mg/dL   Creatinine, Ser 7.35 (*) 0.50 - 1.10 mg/dL   Calcium 9.0  8.4 - 10.5 mg/dL   Phosphorus 2.4  2.3 - 4.6 mg/dL   Albumin 3.9  3.5 - 5.2 g/dL   GFR calc non Af Amer 6 (*) >90 mL/min   GFR calc Af Amer 7 (*) >90 mL/min   Comment:            The eGFR has been calculated     using the CKD EPI equation.     This calculation has not been     validated in all clinical     situations.     eGFR's persistently     <90 mL/min signify     possible Chronic Kidney Disease.    Physical Findings: AIMS: Facial and Oral Movements Muscles of Facial Expression: None, normal Lips and Perioral Area: None, normal Jaw: None, normal Tongue: None, normal,Extremity Movements Upper (arms, wrists, hands, fingers): None, normal Lower (legs, knees, ankles, toes): None, normal, Trunk Movements Neck, shoulders, hips: None, normal, Overall Severity Severity of abnormal movements (highest score from questions above): None, normal Incapacitation due to abnormal movements: None, normal Patient's  awareness of abnormal movements (rate only patient's report): No  Awareness, Dental Status Current problems with teeth and/or dentures?: No Does patient usually wear dentures?: No  CIWA:  CIWA-Ar Total: 0 COWS:  COWS Total Score: 0  Treatment Plan Summary: Daily contact with patient to assess and evaluate symptoms and progress in treatment Medication management  Plan:  Review of chart, vital signs, medications, and notes. 1-Individual and group therapy 2-Medication management for depression and anxiety:  Medications reviewed with the patient and she stated no untoward effects 3-Coping skills for depression and anxiety 4-Continue crisis stabilization and management 5-Address health issues--monitoring vital signs, stable with elevation after dialysis, most likely due to fluid addition while there, rest encouraged after group 6-Treatment plan in progress to prevent relapse of depression and anxiety  Medical Decision Making Problem Points:  Established problem, stable/improving (1) and Review of psycho-social stressors (1) Data Points:  Review of medication regiment & side effects (2)  I certify that inpatient services furnished can reasonably be expected to improve the patient's condition.   Caprina Wussow,MICKIE D.RPA-C CAQ-Psych 04/24/2012, 9:47 AM

## 2012-04-25 DIAGNOSIS — F339 Major depressive disorder, recurrent, unspecified: Secondary | ICD-10-CM

## 2012-04-25 LAB — CBC
Platelets: 213 10*3/uL (ref 150–400)
RBC: 3.15 MIL/uL — ABNORMAL LOW (ref 3.87–5.11)
RDW: 14.5 % (ref 11.5–15.5)
WBC: 6.4 10*3/uL (ref 4.0–10.5)

## 2012-04-25 LAB — RENAL FUNCTION PANEL
Albumin: 3.7 g/dL (ref 3.5–5.2)
Chloride: 92 mEq/L — ABNORMAL LOW (ref 96–112)
GFR calc Af Amer: 3 mL/min — ABNORMAL LOW (ref >90)
GFR calc non Af Amer: 3 mL/min — ABNORMAL LOW (ref >90)
Phosphorus: 3.3 mg/dL (ref 2.3–4.6)
Potassium: 4.7 mEq/L (ref 3.5–5.1)
Sodium: 131 mEq/L — ABNORMAL LOW (ref 135–145)

## 2012-04-25 MED ORDER — CALCIUM ACETATE 667 MG PO CAPS: 667.0000 mg | ORAL_CAPSULE | Freq: Three times a day (TID) | ORAL | Status: DC

## 2012-04-25 MED ORDER — CINACALCET HCL 30 MG PO TABS: 60.0000 mg | ORAL_TABLET | Freq: Every day | ORAL | Status: DC

## 2012-04-25 MED ORDER — PANTOPRAZOLE SODIUM 40 MG PO TBEC: 40.0000 mg | DELAYED_RELEASE_TABLET | Freq: Every day | ORAL | Status: DC

## 2012-04-25 MED ORDER — FLUOXETINE HCL 10 MG PO CAPS: 10.0000 mg | ORAL_CAPSULE | Freq: Every day | ORAL | Status: DC

## 2012-04-25 MED ORDER — RENA-VITE PO TABS: 1.0000 | ORAL_TABLET | Freq: Every day | ORAL | Status: DC

## 2012-04-25 MED ORDER — HYDROXYZINE HCL 25 MG PO TABS: 12.5000 mg | ORAL_TABLET | Freq: Four times a day (QID) | ORAL | Status: DC | PRN

## 2012-04-25 NOTE — Progress Notes (Signed)
[facility] Group Notes:  (Nursing/MHT/Case Management/Adjunct)  Date:  04/24/2012 Time:  2000  Type of Therapy:  Psychoeducational Skills  Participation Level:  Active  Participation Quality:  Appropriate  Affect:  Anxious  Cognitive:  Appropriate  Insight:  Appropriate  Engagement in Group:  Engaged  Modes of Intervention:  Education  Summary of Progress/Problems: The patient admitted in group that she became angry upon learning that her discharge plans for today had been cancelled. She stated that she overcame this challenge by talking with the staff as well as with her roommate ("she has my back"). She also verbalized that after speaking with the staff she came to a better understanding as to why she was never discharged.   Her goal for tomorrow is to prepare herself for discharge.  Archie Balboa S 04/25/2012, 12:55 AM

## 2012-04-25 NOTE — Progress Notes (Signed)
[facility] INPATIENT:  Family/Significant Other Suicide Prevention Education  Suicide Prevention Education:  Education Completed, Spoke with husband Kerianne Stoke in person as he was here to pick up patient, has been identified by the patient as the family member/significant other with whom the patient will be residing, and identified as the person(s) who will aid the patient in the event of a mental health crisis (suicidal ideations/suicide attempt).  With written consent from the patient, the family member/significant other has been provided the following suicide prevention education, prior to the and/or following the discharge of the patient.  The suicide prevention education provided includes the following:  Suicide risk factors  Suicide prevention and interventions  National Suicide Hotline telephone number  [facility] assessment telephone number  [facility] Emergency Assistance [place] and/or Residential Mobile Crisis Unit telephone number  Request made of family/significant other to:  Remove weapons (e.g., guns, rifles, knives), all items previously/currently identified as safety concern.    Remove drugs/medications (over-the-counter, prescriptions, illicit drugs), all items previously/currently identified as a safety concern.  The family member/significant other verbalizes understanding of the suicide prevention education information provided.  The family member/significant other agrees to remove the items of safety concern listed above.  Roque Lias B 04/25/2012, 1:22 PM

## 2012-04-25 NOTE — Progress Notes (Signed)
Discharge Note:  Patient discharged home with family members.  Patient denied SI and HI.   Denied A/V hallucinations.   Denied pain.   Patient completed dialysis this morning at [facility], returned to [facility], ate lunch, and morning medications were given to patient.  MD, N.P. Talked to patient and patient discharged home.   Patient has been cooperative and pleasant and is excited to be discharged today.   Suicide prevention information given to patient, discussed with patient who stated she understood and had no questions.

## 2012-04-25 NOTE — Clinical Social Work Note (Signed)
[facility] LCSW Aftercare Discharge Planning Group Note  04/25/2012 8:45 AM  Participation Quality:  Did Not Attend  Regan Lemming, Lamy 04/25/2012 9:49 AM

## 2012-04-25 NOTE — Progress Notes (Signed)
Psychoeducational Group Note  Date:  04/25/2012 Time:  1100  Group Topic/Focus:  Self Care:   The focus of this group is to help patients understand the importance of self-care in order to improve or restore emotional, physical, spiritual, interpersonal, and financial health.  Participation Level: Did Not Attend  Participation Quality:  Not Applicable  Affect:  Not Applicable  Cognitive:  Not Applicable  Insight:  Not Applicable  Engagement in Group: Not Applicable  Additional Comments:  Pt was off unit during group.  Elenor Legato 04/25/2012, 3:28 PM

## 2012-04-25 NOTE — Progress Notes (Signed)
D:  Patient's self inventory sheet, patient sleeps well, has good appetite, high energy level, good attention span.  Rated hopelessness #1.   Denied withdrawals.   Denied SI.  Physical problems are complications from dialysis.  Worst pain #1, zero pain goal.  After discharge, plans to "take care of myself first before others".  Thanks for support.   Does have discharge plans.  No problems taking medications after discharge. A:  Medications given to patient after return from dialysis this morning.  Staff will continue to monitor every 15 minutes for safety.   Emotional support and encouragement given to patient. R:  Denied SI and HI.  Denied A/V hallucinations.  Denied pain.  Patient remains safe on unit. Patient went to [facility] early this morning for dialysis.  After patient returned from dialysis, patient ate lunch, and morning medications were given to patient.  Patient's family with patient after dialysis.

## 2012-04-25 NOTE — Care Management Utilization Note (Signed)
PER STATE REGULATIONS 482.30  THIS CHART WAS REVIEWED FOR MEDICAL NECESSITY WITH RESPECT TO THE PATIENT'S ADMISSION/ DURATION OF STAY.  NEXT REVIEW DATE: 04/28/2012

## 2012-04-25 NOTE — [facility] Suicide Risk Assessment (Signed)
Suicide Risk Assessment  Discharge Assessment     Demographic Factors:  NA  Mental Status Per Nursing Assessment::   On Admission:  NA  Current Mental Status by Physician: In full contact with reality. There are no suicidal ideas, plans or intent. Her mood is euthymic, her affect is appropriate. There are no suicidal ideas, plans or intent. She is willing and motivated to pursue further outpatient treatment. Family is very supportive   Loss Factors: Decline in physical health  Historical Factors: NA  Risk Reduction Factors:   Sense of responsibility to family, Living with another person, especially a relative and Positive social support  Continued Clinical Symptoms:  Depression:   Insomnia  Cognitive Features That Contribute To Risk: None identified   Suicide Risk:  Minimal: No identifiable suicidal ideation.  Patients presenting with no risk factors but with morbid ruminations; may be classified as minimal risk based on the severity of the depressive symptoms  Discharge Diagnoses:   AXIS I:  Major Depressive Disorder AXIS II:  Deferred AXIS III:   Past Medical History  Diagnosis Date  . Blood transfusion   . Pregnancy induced hypertension   . Preterm labor   . Dialysis patient   . Chronic kidney disease    AXIS IV:  other psychosocial or environmental problems AXIS V:  61-70 mild symptoms  Plan Of Care/Follow-up recommendations:  Activity:  As tolerated Diet:  regular Follow up at [facility] Is patient on multiple antipsychotic therapies at discharge:  No   Has Patient had three or more failed trials of antipsychotic monotherapy by history:  No  Recommended Plan for Multiple Antipsychotic Therapies: N/A   Kirill Chatterjee A 04/25/2012, 1:47 PM

## 2012-04-25 NOTE — Progress Notes (Signed)
Subjective:   Seen post-dialysis, no complaints, pleasant, hopes to go home.  Objective: Vital signs in last 24 hours: Temp:  [99.1 F (37.3 C)-99.3 F (37.4 C)] 99.3 F (37.4 C) (02/10 1145) Pulse Rate:  [78-106] 106 (02/10 1145) Resp:  [14-20] 20 (02/10 1145) BP: (101-140)/(62-84) 128/76 mmHg (02/10 1145) SpO2:  [99 %-100 %] 99 % (02/10 1145) Weight:  [82 kg (180 lb 12.4 oz)-84.2 kg (185 lb 10 oz)] 82 kg (180 lb 12.4 oz) (02/10 1145) Weight change:    Total I/O In: -  Out: 2342 [Other:2342] EXAM: General appearance:  Alert, in no apparent distress Resp:  CTA without rales, rhonchi, or wheezes Cardio:  RRR without murmur or rub GI:  + BS, soft and nontender Extremities:  No edema Access:  AVF @ LFA with + bruit  Lab Results:  Recent Labs  04/22/12 1935 04/25/12 0749  WBC 7.5 6.4  HGB 11.2* 10.0*  HCT 33.6* 29.5*  PLT 237 213   BMET:  Recent Labs  04/22/12 1935 04/25/12 0749  NA 137 131*  K 4.6 4.7  CL 94* 92*  CO2 29 20  GLUCOSE 95 118*  BUN 14 49*  CREATININE 7.35* 14.60*  CALCIUM 9.0 8.3*  ALBUMIN 3.9 3.7   No results found for this basename: PTH,  in the last 72 hours Iron Studies: No results found for this basename: IRON, TIBC, TRANSFERRIN, FERRITIN,  in the last 72 hours  Dialysis Orders: Center: [facility] on MWF. EDW 85.5 kg HD Bath 2K/2.25 Ca Time 3:45 Heparin 8000 . Access LFA Buttonhole/ Self-stick  BFR 400 DFR A1.5 Hectorol 0 mcg IV/HD Epogen 1600 Units IV/HD Venofer 0   Assessment/Plan: 1. Depression/Anxiety - s/p drug overdose; at [facility], per Psychiatry.   2. ESRD - HD on MWF @ [facility]; K 4.7; s/p HD today. 3. HTN/Volume - BP 128/76 most recently, no meds; current wt 82 kg s/p net UF 2.3 L today (EDW 85.5 kg).  Lower EDW at discharge. 4. Anemia - Hgb 10, on outpatient Epogen 1600 U.  5. Secondary hyperparathyroidism - Ca 9.3 (9.5 corrected), P 3.3, on Sensipar 60 mg qd, Phoslo 1 with meals. 6. Nutrition - Alb 3.7, high protein renal diet.   LOS: 5 days   LYLES,CHARLES 04/25/2012,12:17 PM  Patient seen and examined.  Agree with assessment and plan as above. Kelly Splinter  MD [phone] pgr    [phone] cell 04/25/2012, 3:21 PM

## 2012-04-25 NOTE — Discharge Summary (Signed)
Physician Discharge Summary Note  Patient:  Desiree [name] is an 37 y.o., female MRN:  [id number] DOB:  1975-12-03 Patient phone:  [phone] (home)  Patient address:   [place] [address],   Date of Admission:  04/20/2012 Date of Discharge: 04/25/2012  Reason for Admission:  Depression with overdose  Discharge Diagnoses: Active Problems:   Depression  Review of Systems  Constitutional: Positive for malaise/fatigue.       Had dialysis today  HENT: Negative.   Eyes: Negative.   Respiratory: Negative.   Cardiovascular: Negative.   Gastrointestinal: Negative.   Genitourinary: Negative.   Musculoskeletal: Negative.   Skin: Negative.   Neurological: Negative.   Endo/Heme/Allergies: Negative.   Psychiatric/Behavioral: The patient is nervous/anxious.    Axis Diagnosis:   AXIS I:  Anxiety Disorder NOS and Depressive Disorder NOS AXIS II:  Deferred AXIS III:   Past Medical History  Diagnosis Date  . Blood transfusion   . Pregnancy induced hypertension   . Preterm labor   . Dialysis patient   . Chronic kidney disease    AXIS IV:  economic problems, other psychosocial or environmental problems, problems related to social environment and problems with primary support group AXIS V:  61-70 mild symptoms  Level of Care:  OP  Hospital Course:  Reviewed chart, vital signs, medications, and notes. 1-Admitted for crisis management and stabilization, stable. 2-Individual and group therapy attended with participation 3-Medication managed for depression and anxiety to reduce current symptoms to base line and improve the patient's overall level of functioning:  Medications reviewed with the patient and she stated no untoward effects 4-Coping skills for depression and anxiety developed and utilized 5-Addressed health issues--monitoring blood pressures and dialysis medical concerns, stable  6-Treatment plan in place to prevent relapse of depression and  anxiety 7-Psychosocial education regarding relapse prevention and self-care completed 8-Patient denied suicidal/homicidal ideations and auditory/visual hallucinations, follow-up appointments encouraged to attend, patient feeling fatigued after dialysis today, boyfriend and friend are here, patient really wanted to discharge today, appointments made by social worker, patient stable and discharged  Consults:  nephrology  Significant Diagnostic Studies:  labs: Completed and reviewed,stable  Discharge Vitals:   Blood pressure 128/76, pulse 106, temperature 99.3 F (37.4 C), temperature source Oral, resp. rate 20, height 5\' 5"  (1.651 m), weight 82 kg (180 lb 12.4 oz), last menstrual period 04/10/2012, SpO2 99.00%. Body mass index is 30.08 kg/(m^2). Lab Results:   Results for orders placed during the hospital encounter of 04/20/12 (from the past 72 hour(s))  CBC     Status: Abnormal   Collection Time    04/22/12  7:35 PM      Result Value Range   WBC 7.5  4.0 - 10.5 K/uL   RBC 3.49 (*) 3.87 - 5.11 MIL/uL   Hemoglobin 11.2 (*) 12.0 - 15.0 g/dL   HCT 33.6 (*) 36.0 - 46.0 %   MCV 96.3  78.0 - 100.0 fL   MCH 32.1  26.0 - 34.0 pg   MCHC 33.3  30.0 - 36.0 g/dL   RDW 14.7  11.5 - 15.5 %   Platelets 237  150 - 400 K/uL  RENAL FUNCTION PANEL     Status: Abnormal   Collection Time    04/22/12  7:35 PM      Result Value Range   Sodium 137  135 - 145 mEq/L   Potassium 4.6  3.5 - 5.1 mEq/L   Chloride 94 (*) 96 - 112 mEq/L  CO2 29  19 - 32 mEq/L   Glucose, Bld 95  70 - 99 mg/dL   BUN 14  6 - 23 mg/dL   Creatinine, Ser 7.35 (*) 0.50 - 1.10 mg/dL   Calcium 9.0  8.4 - 10.5 mg/dL   Phosphorus 2.4  2.3 - 4.6 mg/dL   Albumin 3.9  3.5 - 5.2 g/dL   GFR calc non Af Amer 6 (*) >90 mL/min   GFR calc Af Amer 7 (*) >90 mL/min   Comment:            The eGFR has been calculated     using the CKD EPI equation.     This calculation has not been     validated in all clinical     situations.     eGFR's  persistently     <90 mL/min signify     possible Chronic Kidney Disease.  RENAL FUNCTION PANEL     Status: Abnormal   Collection Time    04/25/12  7:49 AM      Result Value Range   Sodium 131 (*) 135 - 145 mEq/L   Potassium 4.7  3.5 - 5.1 mEq/L   Chloride 92 (*) 96 - 112 mEq/L   CO2 20  19 - 32 mEq/L   Glucose, Bld 118 (*) 70 - 99 mg/dL   BUN 49 (*) 6 - 23 mg/dL   Creatinine, Ser 14.60 (*) 0.50 - 1.10 mg/dL   Calcium 8.3 (*) 8.4 - 10.5 mg/dL   Phosphorus 3.3  2.3 - 4.6 mg/dL   Albumin 3.7  3.5 - 5.2 g/dL   GFR calc non Af Amer 3 (*) >90 mL/min   GFR calc Af Amer 3 (*) >90 mL/min   Comment:            The eGFR has been calculated     using the CKD EPI equation.     This calculation has not been     validated in all clinical     situations.     eGFR's persistently     <90 mL/min signify     possible Chronic Kidney Disease.  CBC     Status: Abnormal   Collection Time    04/25/12  7:49 AM      Result Value Range   WBC 6.4  4.0 - 10.5 K/uL   RBC 3.15 (*) 3.87 - 5.11 MIL/uL   Hemoglobin 10.0 (*) 12.0 - 15.0 g/dL   HCT 29.5 (*) 36.0 - 46.0 %   MCV 93.7  78.0 - 100.0 fL   MCH 31.7  26.0 - 34.0 pg   MCHC 33.9  30.0 - 36.0 g/dL   RDW 14.5  11.5 - 15.5 %   Platelets 213  150 - 400 K/uL    Physical Findings: AIMS: Facial and Oral Movements Muscles of Facial Expression: None, normal Lips and Perioral Area: None, normal Jaw: None, normal Tongue: None, normal,Extremity Movements Upper (arms, wrists, hands, fingers): None, normal Lower (legs, knees, ankles, toes): None, normal, Trunk Movements Neck, shoulders, hips: None, normal, Overall Severity Severity of abnormal movements (highest score from questions above): None, normal Incapacitation due to abnormal movements: None, normal Patient's awareness of abnormal movements (rate only patient's report): No Awareness, Dental Status Current problems with teeth and/or dentures?: No Does patient usually wear dentures?: No  CIWA:   CIWA-Ar Total: 0 COWS:  COWS Total Score: 0  Psychiatric Specialty Exam: See Psychiatric Specialty Exam and Suicide Risk Assessment completed by  Attending Physician prior to discharge.  Discharge destination:  Home  Is patient on multiple antipsychotic therapies at discharge:  No   Has Patient had three or more failed trials of antipsychotic monotherapy by history:  No Recommended Plan for Multiple Antipsychotic Therapies:  N/A  Discharge Orders   Future Orders Complete By Expires     Activity as tolerated - No restrictions  As directed     Diet - low sodium heart healthy  As directed         Medication List    STOP taking these medications       NEXIUM PO  Replaced by:  pantoprazole 40 MG tablet      TAKE these medications     Indication   calcium acetate 667 MG capsule  Commonly known as:  PHOSLO  Take 1 capsule (667 mg total) by mouth 3 (three) times daily with meals.   Indication:  Hyperphosphatemia D/T Renal Insufficiency     cinacalcet 30 MG tablet  Commonly known as:  SENSIPAR  Take 2 tablets (60 mg total) by mouth daily.   Indication:  dialysis medication     FLUoxetine 10 MG capsule  Commonly known as:  PROZAC  Take 1 capsule (10 mg total) by mouth daily.   Indication:  Depression     hydrOXYzine 25 MG tablet  Commonly known as:  ATARAX/VISTARIL  Take 0.5 tablets (12.5 mg total) by mouth every 6 (six) hours as needed for anxiety.   Indication:  anxiety     multivitamin Tabs tablet  Take 1 tablet by mouth daily.   Indication:  vitamin for dialysis     pantoprazole 40 MG tablet  Commonly known as:  PROTONIX  Take 1 tablet (40 mg total) by mouth daily.   Indication:  Gastroesophageal Reflux Disease         Follow-up recommendations:  Activity:  As tolerated Diet:  Low sodium heart healthy, renal diet per dialysis center instructions  Comments:  Patient will follow-up at her regular dialysis center and appointment in place for after discharge  care  Total Discharge Time:  Greater than 30 minutes.  SignedWaylan Boga, PMH-NP 04/25/2012, 1:16 PM

## 2012-04-25 NOTE — Progress Notes (Signed)
[facility] Adult Case Management Discharge Plan :  Will you be returning to the same living situation after discharge: Yes,  home At discharge, do you have transportation home?:Yes,  husband Do you have the ability to pay for your medications:Yes,  mental health  Release of information consent forms completed and in the chart;  Patient's signature needed at discharge.  Patient to Follow up at: Follow-up Information   Follow up with [facility]. (Go to the walk in clinic M-F between 8 and 9AM for your hospital follow up appointment.  This is where you will see a psychiatrist for your medication)    Contact information:   [place] [phone]      Patient denies SI/HI:   Yes,  yes    Safety Planning and Suicide Prevention discussed:  Yes,  yes  Trish Mage 04/25/2012, 1:24 PM

## 2012-04-26 NOTE — Discharge Summary (Signed)
Reviewed

## 2012-04-26 NOTE — Progress Notes (Signed)
Reviewed

## 2012-04-29 NOTE — Progress Notes (Signed)
Agree with assessment and plan Janyla Biscoe A. Mindel Friscia, M.D. 

## 2012-04-29 NOTE — Progress Notes (Signed)
Patient Discharge Instructions:  After Visit Summary (AVS):   Faxed to:  04/29/12 Discharge Summary Note:   Faxed to:  04/29/12 Psychiatric Admission Assessment Note:   Faxed to:  04/29/12 Suicide Risk Assessment - Discharge Assessment:   Faxed to:  04/29/12 Faxed/Sent to the Next Level Care provider:  04/29/12 Faxed to [facility] [place], 04/29/2012, 4:12 PM

## 2012-04-29 NOTE — Progress Notes (Signed)
Agree with assessment and plan Deavion Strider A. Dawnita Molner, M.D. 

## 2012-05-09 ENCOUNTER — Encounter (HOSPITAL_COMMUNITY): Payer: Self-pay | Admitting: *Deleted

## 2012-05-09 ENCOUNTER — Emergency Department (HOSPITAL_COMMUNITY)
Admission: EM | Admit: 2012-05-09 | Discharge: 2012-05-10 | Disposition: A | Discharge status: Home / Self Care | Payer: Medicare Other | Attending: Emergency Medicine | Admitting: Emergency Medicine

## 2012-05-09 DIAGNOSIS — M545 Low back pain, unspecified: Secondary | ICD-10-CM | POA: Insufficient documentation

## 2012-05-09 DIAGNOSIS — Z992 Dependence on renal dialysis: Secondary | ICD-10-CM | POA: Insufficient documentation

## 2012-05-09 DIAGNOSIS — N390 Urinary tract infection, site not specified: Secondary | ICD-10-CM | POA: Insufficient documentation

## 2012-05-09 DIAGNOSIS — Z3202 Encounter for pregnancy test, result negative: Secondary | ICD-10-CM | POA: Insufficient documentation

## 2012-05-09 DIAGNOSIS — Z79899 Other long term (current) drug therapy: Secondary | ICD-10-CM | POA: Insufficient documentation

## 2012-05-09 DIAGNOSIS — Z5189 Encounter for other specified aftercare: Secondary | ICD-10-CM | POA: Insufficient documentation

## 2012-05-09 DIAGNOSIS — N186 End stage renal disease: Secondary | ICD-10-CM | POA: Insufficient documentation

## 2012-05-09 DIAGNOSIS — Z8742 Personal history of other diseases of the female genital tract: Secondary | ICD-10-CM | POA: Insufficient documentation

## 2012-05-09 DIAGNOSIS — Z8751 Personal history of pre-term labor: Secondary | ICD-10-CM | POA: Insufficient documentation

## 2012-05-09 MED ORDER — HYDROCODONE-ACETAMINOPHEN 5-325 MG PO TABS
1.0000 | ORAL_TABLET | ORAL | Status: AC
  Administered 2012-05-10: 1 via ORAL
  Filled 2012-05-09: qty 1

## 2012-05-09 NOTE — ED Provider Notes (Signed)
History     CSN: [id number] Arrival date & time 05/09/12  2315 First MD Initiated Contact with Patient 05/09/12 2352      Chief Complaint  Patient presents with  . Dysuria  . Back Pain    HPI Patient presents to emergency room with complaints of dysuria. She noticed this morning when she urinated she was ever burning discomfort. She denies any discharge any fevers or vomiting. The patient also has been having pain in her back. The pain is sharp and sore in her lower back it increases with movement and palpation. Bending over is more uncomfortable. Patient has chronic renal failure and gets dialysis Monday Wednesday Friday. She did have dialysis today. Patient states she urinates still several times per day.   Past Medical History  Diagnosis Date  . Blood transfusion   . Pregnancy induced hypertension   . Preterm labor   . Dialysis patient   . Chronic kidney disease     Past Surgical History  Procedure Laterality Date  . Renal biopsy    . Dilation and curettage of uterus    . Dilation and evacuation  08/02/2011    Procedure: DILATATION AND EVACUATION;  Surgeon: Logan Bores, MD;  Location: [facility];  Service: Gynecology;;    Family History  Problem Relation Age of Onset  . Hypertension    . Diabetes type II      History  Substance Use Topics  . Smoking status: Never Smoker   . Smokeless tobacco: Not on file  . Alcohol Use: No    OB History   Grav Para Term Preterm Abortions TAB SAB Ect Mult Living   5 2 1 1 1 1  0 0 0 1      Review of Systems  Constitutional: Negative for fever.  Gastrointestinal: Negative for vomiting and diarrhea.  All other systems reviewed and are negative.    Allergies  Morphine and related and Prednisone  Home Medications   Current Outpatient Rx  Name  Route  Sig  Dispense  Refill  . calcium acetate (PHOSLO) 667 MG capsule   Oral   Take 1 capsule (667 mg total) by mouth 3 (three) times daily with meals.   90 capsule   0   .  cinacalcet (SENSIPAR) 30 MG tablet   Oral   Take 2 tablets (60 mg total) by mouth daily.   60 tablet   0   . FLUoxetine (PROZAC) 10 MG capsule   Oral   Take 1 capsule (10 mg total) by mouth daily.   30 capsule   0   . hydrOXYzine (ATARAX/VISTARIL) 25 MG tablet   Oral   Take 0.5 tablets (12.5 mg total) by mouth every 6 (six) hours as needed for anxiety.   30 tablet   0   . multivitamin (RENA-VIT) TABS tablet   Oral   Take 1 tablet by mouth daily.   30 tablet   0   . pantoprazole (PROTONIX) 40 MG tablet   Oral   Take 1 tablet (40 mg total) by mouth daily.   30 tablet   0     BP 98/67  Pulse 97  Temp(Src) 99.1 F (37.3 C) (Oral)  Resp 18  Ht 5\' 5"  (1.651 m)  Wt 181 lb 14.1 oz (82.501 kg)  BMI 30.27 kg/m2  SpO2 100%  LMP 10/09/2010  Physical Exam  Nursing note and vitals reviewed. Constitutional: She appears well-developed and well-nourished. No distress.  HENT:  Head: Normocephalic and atraumatic.  Right Ear: External ear normal.  Left Ear: External ear normal.  Eyes: Conjunctivae are normal. Right eye exhibits no discharge. Left eye exhibits no discharge. No scleral icterus.  Neck: Neck supple. No tracheal deviation present.  Cardiovascular: Normal rate, regular rhythm and intact distal pulses.   Pulmonary/Chest: Effort normal and breath sounds normal. No stridor. No respiratory distress. She has no wheezes. She has no rales.  Abdominal: Soft. Bowel sounds are normal. She exhibits no distension. There is no tenderness. There is no rebound and no guarding.  Musculoskeletal: She exhibits no edema and no tenderness.       Lumbar back: She exhibits tenderness. She exhibits no bony tenderness, no swelling and no edema.  Pain with flexion, no CVAT; left ue with AV fistula, no erythema, good thrill  Neurological: She is alert. She has normal strength. No sensory deficit. Cranial nerve deficit:  no gross defecits noted. She exhibits normal muscle tone. She displays no  seizure activity. Coordination normal.  Skin: Skin is warm and dry. No rash noted.  Psychiatric: She has a normal mood and affect.    ED Course  Procedures (including critical care time)  Labs Reviewed  URINALYSIS, ROUTINE W REFLEX MICROSCOPIC - Abnormal; Notable for the following:    APPearance CLOUDY (*)    Glucose, UA 100 (*)    Hgb urine dipstick LARGE (*)    Ketones, ur TRACE (*)    Protein, ur >300 (*)    Leukocytes, UA MODERATE (*)    All other components within normal limits  POCT I-STAT, CHEM 8 - Abnormal; Notable for the following:    Creatinine, Ser 9.00 (*)    Calcium, Ion 1.04 (*)    Hemoglobin 11.9 (*)    HCT 35.0 (*)    All other components within normal limits  POCT I-STAT, CHEM 8 - Abnormal; Notable for the following:    Creatinine, Ser 8.70 (*)    Calcium, Ion 1.06 (*)    Hemoglobin 11.9 (*)    HCT 35.0 (*)    All other components within normal limits  URINE MICROSCOPIC-ADD ON  POCT PREGNANCY, URINE   No results found.   1. UTI (urinary tract infection)       MDM  Patient's urinalysis is consistent with a urinary tract infection. She is nontoxic and afebrile. Will give her a dose of Keflex and give her prescription. She is instructed to return to emergency room for fever worsening symptoms.        Kathalene Frames, MD 05/10/12 [phone]

## 2012-05-09 NOTE — ED Notes (Signed)
Pt reports dysuria and back pain that began today - pt denies fever - states she is a dialysis pt (M,W,F dialysis) and has been experiencing pain at her fistula site as well.

## 2012-05-10 LAB — URINE MICROSCOPIC-ADD ON

## 2012-05-10 LAB — POCT I-STAT, CHEM 8
BUN: 19 mg/dL (ref 6–23)
Calcium, Ion: 1.06 mmol/L — ABNORMAL LOW (ref 1.12–1.23)
Chloride: 97 mEq/L (ref 96–112)
Chloride: 98 mEq/L (ref 96–112)
Creatinine, Ser: 9 mg/dL — ABNORMAL HIGH (ref 0.50–1.10)
Glucose, Bld: 90 mg/dL (ref 70–99)
Glucose, Bld: 90 mg/dL (ref 70–99)
HCT: 35 % — ABNORMAL LOW (ref 36.0–46.0)
Potassium: 3.6 mEq/L (ref 3.5–5.1)
Potassium: 3.6 mEq/L (ref 3.5–5.1)
Sodium: 137 mEq/L (ref 135–145)

## 2012-05-10 LAB — URINALYSIS, ROUTINE W REFLEX MICROSCOPIC
Nitrite: NEGATIVE
Specific Gravity, Urine: 1.015 (ref 1.005–1.030)
Urobilinogen, UA: 0.2 mg/dL (ref 0.0–1.0)
pH: 8 (ref 5.0–8.0)

## 2012-05-10 LAB — POCT PREGNANCY, URINE: Preg Test, Ur: NEGATIVE

## 2012-05-10 MED ORDER — CEPHALEXIN 500 MG PO CAPS
500.0000 mg | ORAL_CAPSULE | Freq: Once | ORAL | Status: AC
  Administered 2012-05-10: 500 mg via ORAL
  Filled 2012-05-10: qty 1

## 2012-05-10 MED ORDER — HYDROCODONE-ACETAMINOPHEN 5-325 MG PO TABS: 1.0000 | ORAL_TABLET | Freq: Four times a day (QID) | ORAL | Status: DC | PRN

## 2012-05-10 MED ORDER — CEPHALEXIN 500 MG PO CAPS: 500.0000 mg | ORAL_CAPSULE | Freq: Two times a day (BID) | ORAL | Status: DC

## 2012-05-10 NOTE — ED Notes (Signed)
Pt attempt to void but was unable to void at this time. Nurse is aware

## 2012-07-10 ENCOUNTER — Emergency Department (HOSPITAL_COMMUNITY)
Admission: EM | Admit: 2012-07-10 | Discharge: 2012-07-10 | Discharge status: Against Medical Advice | Payer: Medicare Other | Attending: Emergency Medicine | Admitting: Emergency Medicine

## 2012-07-10 ENCOUNTER — Encounter (HOSPITAL_COMMUNITY): Payer: Self-pay | Admitting: Emergency Medicine

## 2012-07-10 DIAGNOSIS — M79609 Pain in unspecified limb: Secondary | ICD-10-CM | POA: Insufficient documentation

## 2012-07-10 DIAGNOSIS — N189 Chronic kidney disease, unspecified: Secondary | ICD-10-CM | POA: Insufficient documentation

## 2012-07-10 NOTE — ED Notes (Signed)
Pain in middle finger r/hand. Pt denies trauma this am.

## 2012-08-07 ENCOUNTER — Emergency Department (HOSPITAL_COMMUNITY): Payer: Medicare Other

## 2012-08-07 ENCOUNTER — Emergency Department (HOSPITAL_COMMUNITY)
Admission: EM | Admit: 2012-08-07 | Discharge: 2012-08-07 | Disposition: A | Discharge status: Home / Self Care | Payer: Medicare Other | Attending: Emergency Medicine | Admitting: Emergency Medicine

## 2012-08-07 ENCOUNTER — Encounter (HOSPITAL_COMMUNITY): Payer: Self-pay | Admitting: *Deleted

## 2012-08-07 DIAGNOSIS — W1809XA Striking against other object with subsequent fall, initial encounter: Secondary | ICD-10-CM | POA: Insufficient documentation

## 2012-08-07 DIAGNOSIS — Z992 Dependence on renal dialysis: Secondary | ICD-10-CM | POA: Insufficient documentation

## 2012-08-07 DIAGNOSIS — N189 Chronic kidney disease, unspecified: Secondary | ICD-10-CM | POA: Insufficient documentation

## 2012-08-07 DIAGNOSIS — IMO0002 Reserved for concepts with insufficient information to code with codable children: Secondary | ICD-10-CM | POA: Insufficient documentation

## 2012-08-07 DIAGNOSIS — S39012A Strain of muscle, fascia and tendon of lower back, initial encounter: Secondary | ICD-10-CM

## 2012-08-07 DIAGNOSIS — Z79899 Other long term (current) drug therapy: Secondary | ICD-10-CM | POA: Insufficient documentation

## 2012-08-07 DIAGNOSIS — I129 Hypertensive chronic kidney disease with stage 1 through stage 4 chronic kidney disease, or unspecified chronic kidney disease: Secondary | ICD-10-CM | POA: Insufficient documentation

## 2012-08-07 DIAGNOSIS — Y92009 Unspecified place in unspecified non-institutional (private) residence as the place of occurrence of the external cause: Secondary | ICD-10-CM | POA: Insufficient documentation

## 2012-08-07 DIAGNOSIS — S335XXA Sprain of ligaments of lumbar spine, initial encounter: Secondary | ICD-10-CM | POA: Insufficient documentation

## 2012-08-07 DIAGNOSIS — M25559 Pain in unspecified hip: Secondary | ICD-10-CM | POA: Insufficient documentation

## 2012-08-07 DIAGNOSIS — Y93E1 Activity, personal bathing and showering: Secondary | ICD-10-CM | POA: Insufficient documentation

## 2012-08-07 DIAGNOSIS — M549 Dorsalgia, unspecified: Secondary | ICD-10-CM

## 2012-08-07 DIAGNOSIS — M25551 Pain in right hip: Secondary | ICD-10-CM

## 2012-08-07 HISTORY — DX: Essential (primary) hypertension: I10

## 2012-08-07 MED ORDER — HYDROCODONE-ACETAMINOPHEN 5-325 MG PO TABS: 0.5000 | ORAL_TABLET | ORAL | Status: DC | PRN

## 2012-08-07 MED ORDER — HYDROCODONE-ACETAMINOPHEN 5-325 MG PO TABS
0.5000 | ORAL_TABLET | Freq: Once | ORAL | Status: AC
  Administered 2012-08-07: 0.5 via ORAL
  Filled 2012-08-07: qty 1

## 2012-08-07 NOTE — ED Notes (Signed)
PA at bedside.

## 2012-08-07 NOTE — ED Provider Notes (Signed)
History     CSN: [id number]  Arrival date & time 08/07/12  1121   First MD Initiated Contact with Patient 08/07/12 1137      Chief Complaint  Patient presents with  . Fall  . Back Pain    lower  . Hip Pain    right    (Consider location/radiation/quality/duration/timing/severity/associated sxs/prior treatment) HPI Pt is a 37yo female c/o lower back pain and right hip pain after slipping on floor getting out of tub and hit back and hip on tub 2 hours ago.  Denies hitting head or LOC.  Pain in lower back is 10/10, constant and sharp, radiates down into right hip.  Right hip pain is throbbing 9/10 and constant. Pain is worse with walking.  Has not had anything for pain.  Pt is a dialysis pt, unable to take acetaminophen, cannot have ibuprofen due to stomach ulcers.  Denies any other injuries.  Denies head or neck pain.    Past Medical History  Diagnosis Date  . Blood transfusion   . Preterm labor   . Dialysis patient   . Chronic kidney disease   . Hypertension     Past Surgical History  Procedure Laterality Date  . Renal biopsy    . Dilation and curettage of uterus    . Dilation and evacuation  08/02/2011    Procedure: DILATATION AND EVACUATION;  Surgeon: Logan Bores, MD;  Location: [facility];  Service: Gynecology;;    Family History  Problem Relation Age of Onset  . Hypertension    . Diabetes type II      History  Substance Use Topics  . Smoking status: Never Smoker   . Smokeless tobacco: Never Used  . Alcohol Use: Yes     Comment: occ    OB History   Grav Para Term Preterm Abortions TAB SAB Ect Mult Living   5 2 1 1 1 1  0 0 0 1      Review of Systems  Musculoskeletal: Positive for myalgias and back pain.  Skin: Negative.   Neurological: Negative for dizziness, tremors, seizures, syncope, weakness, light-headedness, numbness and headaches.    Allergies  Morphine and related and Prednisone  Home Medications   Current Outpatient Rx  Name  Route   Sig  Dispense  Refill  . calcium acetate (PHOSLO) 667 MG capsule   Oral   Take 2,668 mg by mouth 3 (three) times daily with meals.         Marland Kitchen esomeprazole (NEXIUM) 40 MG capsule   Oral   Take 40 mg by mouth daily before breakfast.         . HYDROcodone-acetaminophen (NORCO/VICODIN) 5-325 MG per tablet   Oral   Take 0.5 tablets by mouth every 4 (four) hours as needed for pain (take 0.5 pills every 4-6 hours as needed for pain).   6 tablet   0     BP 138/96  Pulse 64  Temp(Src) 99 F (37.2 C) (Oral)  Resp 18  Ht 5\' 5"  (1.651 m)  Wt 181 lb (82.101 kg)  BMI 30.12 kg/m2  SpO2 100%  LMP 07/28/2012  Physical Exam  Nursing note and vitals reviewed. Constitutional: She appears well-developed and well-nourished. No distress.  Pt sitting on edge of exam chair sitting straight up, rocking back and forth.  States it helps with the pain.  HENT:  Head: Normocephalic and atraumatic.  Eyes: Conjunctivae and EOM are normal. Pupils are equal, round, and reactive to light. Right  eye exhibits no discharge. Left eye exhibits no discharge. No scleral icterus.  Neck: Normal range of motion. Neck supple.  Cardiovascular: Normal rate, regular rhythm and normal heart sounds.   Pulmonary/Chest: Effort normal and breath sounds normal. No respiratory distress. She has no wheezes. She has no rales. She exhibits no tenderness.  Musculoskeletal: Normal range of motion. She exhibits tenderness. She exhibits no edema.       Lumbar back: She exhibits tenderness and bony tenderness. She exhibits no swelling, no edema and no deformity.       Back:  Neurological: She is alert. She has normal strength. No cranial nerve deficit or sensory deficit. Gait ( antalgic) abnormal.  Skin: Skin is warm and dry. She is not diaphoretic.  Psychiatric: She has a normal mood and affect. Her behavior is normal.    ED Course  Procedures (including critical care time)  Labs Reviewed - No data to display Dg Lumbar Spine  Complete  08/07/2012   *RADIOLOGY REPORT*  Clinical Data: [place].  Lumbar region pain.  LUMBAR SPINE - COMPLETE 4+ VIEW  Comparison: MRI 06/07/2009  Findings: Alignment is normal.  No disc space narrowing.  No fracture.  No pars defect or other focal finding.  IMPRESSION: Negative radiographs   Original Report Authenticated By: Nelson Chimes, M.D.   Dg Pelvis 1-2 Views  08/07/2012   *RADIOLOGY REPORT*  Clinical Data: [place].  Lumbar pelvic pain.  PELVIS - 1-2 VIEW  Comparison: None.  Findings: No evidence of pelvic fracture.  Sacroiliac joints and symphysis pubis appear normal.  IMPRESSION: Negative   Original Report Authenticated By: Nelson Chimes, M.D.     1. Lumbar strain, initial encounter   2. Back pain   3. Acute right hip pain       MDM  Pt presented with LBP and right hip pain after slipping getting out of bath tub.  Back and hip hit side of tub.  Denies hitting head or LOC.  No head or neck pain. Pt is on dialysis and has allergies to multiple pain medications.  States she is able to take half of a vicodin every 4 hours rather than the standard 2 every 4 hours.  Plain films: no fracture of lumbar spine or right hip.  Rx: vicodin (take 0.5 pill due to dialysis, pt has taken in past without problems) Will have pt f/u with PCP in 4 days if pain is not improving.  May alternate ice and heat for comfort.  Provided pt education on back exercises.  Vitals: unremarkable. Discharged home in stable condition.      Noland Fordyce, PA-C 08/07/12 1314

## 2012-08-07 NOTE — ED Provider Notes (Signed)
Medical screening examination/treatment/procedure(s) were performed by non-physician practitioner and as supervising physician I was immediately available for consultation/collaboration.   Alfonzo Feller, DO 08/07/12 2135

## 2012-08-07 NOTE — ED Notes (Signed)
Patient transported to X-ray 

## 2012-08-07 NOTE — ED Notes (Signed)
Pt from home with reports of slipping on floor while getting out of bath tub and fell against tub. Pt reports lower back and right hip pain.

## 2012-09-10 ENCOUNTER — Emergency Department (HOSPITAL_COMMUNITY): Payer: Medicare Other

## 2012-09-10 ENCOUNTER — Emergency Department (HOSPITAL_COMMUNITY)
Admission: EM | Admit: 2012-09-10 | Discharge: 2012-09-10 | Disposition: A | Discharge status: Home / Self Care | Payer: Medicare Other | Attending: Emergency Medicine | Admitting: Emergency Medicine

## 2012-09-10 ENCOUNTER — Encounter (HOSPITAL_COMMUNITY): Payer: Self-pay | Admitting: Emergency Medicine

## 2012-09-10 DIAGNOSIS — I129 Hypertensive chronic kidney disease with stage 1 through stage 4 chronic kidney disease, or unspecified chronic kidney disease: Secondary | ICD-10-CM | POA: Insufficient documentation

## 2012-09-10 DIAGNOSIS — Z992 Dependence on renal dialysis: Secondary | ICD-10-CM | POA: Insufficient documentation

## 2012-09-10 DIAGNOSIS — Z79899 Other long term (current) drug therapy: Secondary | ICD-10-CM | POA: Insufficient documentation

## 2012-09-10 DIAGNOSIS — N189 Chronic kidney disease, unspecified: Secondary | ICD-10-CM | POA: Insufficient documentation

## 2012-09-10 DIAGNOSIS — R197 Diarrhea, unspecified: Secondary | ICD-10-CM | POA: Insufficient documentation

## 2012-09-10 DIAGNOSIS — A5901 Trichomonal vulvovaginitis: Secondary | ICD-10-CM | POA: Insufficient documentation

## 2012-09-10 DIAGNOSIS — Z3202 Encounter for pregnancy test, result negative: Secondary | ICD-10-CM | POA: Insufficient documentation

## 2012-09-10 DIAGNOSIS — R300 Dysuria: Secondary | ICD-10-CM | POA: Insufficient documentation

## 2012-09-10 DIAGNOSIS — R110 Nausea: Secondary | ICD-10-CM | POA: Insufficient documentation

## 2012-09-10 DIAGNOSIS — R1031 Right lower quadrant pain: Secondary | ICD-10-CM

## 2012-09-10 DIAGNOSIS — R52 Pain, unspecified: Secondary | ICD-10-CM | POA: Insufficient documentation

## 2012-09-10 DIAGNOSIS — R0602 Shortness of breath: Secondary | ICD-10-CM | POA: Insufficient documentation

## 2012-09-10 DIAGNOSIS — A599 Trichomoniasis, unspecified: Secondary | ICD-10-CM

## 2012-09-10 DIAGNOSIS — R109 Unspecified abdominal pain: Secondary | ICD-10-CM | POA: Insufficient documentation

## 2012-09-10 LAB — CBC WITH DIFFERENTIAL/PLATELET
Basophils Absolute: 0 10*3/uL (ref 0.0–0.1)
Basophils Relative: 0 % (ref 0–1)
HCT: 34.4 % — ABNORMAL LOW (ref 36.0–46.0)
Hemoglobin: 11.6 g/dL — ABNORMAL LOW (ref 12.0–15.0)
Lymphocytes Relative: 35 % (ref 12–46)
MCHC: 33.7 g/dL (ref 30.0–36.0)
Monocytes Absolute: 0.3 10*3/uL (ref 0.1–1.0)
Neutro Abs: 4.9 10*3/uL (ref 1.7–7.7)
Neutrophils Relative %: 59 % (ref 43–77)
RDW: 13.1 % (ref 11.5–15.5)
WBC: 8.3 10*3/uL (ref 4.0–10.5)

## 2012-09-10 LAB — POCT PREGNANCY, URINE: Preg Test, Ur: NEGATIVE

## 2012-09-10 LAB — WET PREP, GENITAL

## 2012-09-10 LAB — URINALYSIS, ROUTINE W REFLEX MICROSCOPIC
Bilirubin Urine: NEGATIVE
Nitrite: NEGATIVE
Specific Gravity, Urine: 1.012 (ref 1.005–1.030)
Urobilinogen, UA: 0.2 mg/dL (ref 0.0–1.0)
pH: 7.5 (ref 5.0–8.0)

## 2012-09-10 LAB — URINE MICROSCOPIC-ADD ON

## 2012-09-10 MED ORDER — OXYCODONE-ACETAMINOPHEN 5-325 MG PO TABS: 1.0000 | ORAL_TABLET | Freq: Four times a day (QID) | ORAL | Status: DC | PRN

## 2012-09-10 MED ORDER — AZITHROMYCIN 250 MG PO TABS
1000.0000 mg | ORAL_TABLET | Freq: Once | ORAL | Status: AC
  Administered 2012-09-10: 1000 mg via ORAL
  Filled 2012-09-10: qty 4

## 2012-09-10 MED ORDER — METRONIDAZOLE 500 MG PO TABS: 500.0000 mg | ORAL_TABLET | Freq: Two times a day (BID) | ORAL | Status: DC

## 2012-09-10 MED ORDER — OXYCODONE-ACETAMINOPHEN 5-325 MG PO TABS
1.0000 | ORAL_TABLET | Freq: Once | ORAL | Status: AC
  Administered 2012-09-10: 1 via ORAL
  Filled 2012-09-10: qty 1

## 2012-09-10 MED ORDER — DIPHENHYDRAMINE HCL 25 MG PO CAPS
25.0000 mg | ORAL_CAPSULE | Freq: Once | ORAL | Status: AC
  Administered 2012-09-10: 25 mg via ORAL
  Filled 2012-09-10: qty 1

## 2012-09-10 MED ORDER — CEFTRIAXONE SODIUM 250 MG IJ SOLR
250.0000 mg | Freq: Once | INTRAMUSCULAR | Status: AC
  Administered 2012-09-10: 250 mg via INTRAMUSCULAR
  Filled 2012-09-10: qty 250

## 2012-09-10 MED ORDER — METRONIDAZOLE 500 MG PO TABS
500.0000 mg | ORAL_TABLET | Freq: Once | ORAL | Status: AC
  Administered 2012-09-10: 500 mg via ORAL
  Filled 2012-09-10: qty 1

## 2012-09-10 MED ORDER — ONDANSETRON 8 MG PO TBDP
8.0000 mg | ORAL_TABLET | Freq: Once | ORAL | Status: AC
  Administered 2012-09-10: 8 mg via ORAL
  Filled 2012-09-10: qty 1

## 2012-09-10 MED ORDER — ONDANSETRON HCL 4 MG PO TABS: 4.0000 mg | ORAL_TABLET | Freq: Four times a day (QID) | ORAL | Status: DC

## 2012-09-10 NOTE — ED Notes (Signed)
Pt c/o back pain and "I ain't breathin too good" x 3 days.  Pt is in NAD and is able to complete sentences.  Pt appears bored during triage process.

## 2012-09-10 NOTE — ED Notes (Signed)
Pending transfer to main er. The patient has missed several dialysis treatments and have multiple more complaints that ordinally expressed.

## 2012-09-10 NOTE — ED Provider Notes (Signed)
MSE performed by me.  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  Labs ordered, will need further evaluation.  Pt does make urine    Patient is a 37 y.o. female presenting with back pain. The history is provided by the patient. No language interpreter was used.  Back Pain  Radiates to: Does not radiate  Pain severity: Moderate  Pain is: Same all the time  Onset quality: Gradual  Duration: 4 days  Timing: Constant  Chronicity: New  Relieved by: Nothing Associated symptoms: dysuria   HPI Comments:  Desiree [name] is a 37 y.o. female who presents to the Emergency Department complaining of gradual onset, constant sharp back pain with associated abdominal pain, and dysuria that started 4 days ago. She denies injury to her back.Pt states she has had SOB since yesterday. She states laying down makes the CP worse. She states she has taken off brand pain medication with no relief. She states she has tried heat and ice with no relief. Pt states she has been on dialysis for 4 years. She states her last dialysis was Monday. Pt denies fever, chills, and cough as associated symptoms.  Past Medical History   Diagnosis  Date   .  Blood transfusion    .  Preterm labor    .  Dialysis patient    .  Chronic kidney disease    .  Hypertension     Past Surgical History   Procedure  Laterality  Date   .  Renal biopsy     .  Dilation and curettage of uterus     .  Dilation and evacuation   08/02/2011     Procedure: DILATATION AND EVACUATION; Surgeon: Logan Bores, MD; Location: [facility]; Service: Gynecology;;    Family History   Problem  Relation  Age of Onset   .  Hypertension     .  Diabetes type II      History   Substance Use Topics   .  Smoking status:  Never Smoker   .  Smokeless tobacco:  Never Used   .  Alcohol Use:  Yes      Comment: occ    OB History    Grav  Para  Term  Preterm  Abortions  TAB  SAB   Ect  Mult  Living    5  2  1  1  1  1   0  0  0  1      Review of Systems  Genitourinary: Positive for dysuria.  Musculoskeletal: Positive for back pain.  All other systems reviewed and are negative.   Allergies   Morphine and related and Prednisone  Home Medications    Current Outpatient Rx   Name   Route   Sig   Dispense   Refill   .  calcium acetate (PHOSLO) 667 MG capsule   Oral   Take 2,668 mg by mouth 3 (three) times daily with meals.       .  cinacalcet (SENSIPAR) 60 MG tablet   Oral   Take 60 mg by mouth daily.       Marland Kitchen  esomeprazole (NEXIUM) 40 MG capsule   Oral   Take 40 mg by mouth 2 (two) times daily.       Marland Kitchen  HYDROcodone-acetaminophen (NORCO/VICODIN) 5-325 MG per tablet   Oral   Take 0.5 tablets  by mouth every 4 (four) hours as needed for pain (take 0.5 pills every 4-6 hours as needed for pain).   6 tablet   0    BP 112/78  Pulse 80  Temp(Src) 99 F (37.2 C) (Oral)  Resp 17  SpO2 98%  LMP 08/23/2012  Physical Exam  Nursing note and vitals reviewed.  Constitutional: She is oriented to person, place, and time. She appears well-developed and well-nourished. No distress.  HENT:  Head: Normocephalic and atraumatic.  Eyes: EOM are normal.  Neck: Neck supple. No tracheal deviation present.  Cardiovascular: Normal rate.  Pulmonary/Chest: Effort normal. No respiratory distress.  Musculoskeletal: Normal range of motion.  Diffuse tenderness throughout whole back with no port tenderness, no midline crepitance or step off. Left forearm with palpable AV fistula. Appears to be in good working order.  Neurological: She is alert and oriented to person, place, and time.  Skin: Skin is warm and dry.  Psychiatric: She has a normal mood and affect. Her behavior is normal.   I personally performed the services described in this documentation, which was scribed in my presence. The recorded information has been reviewed and is accurate.     Domenic Moras, PA-C 09/10/12 1547

## 2012-09-10 NOTE — ED Provider Notes (Addendum)
History    CSN: [id number] Arrival date & time 09/10/12  1509  First MD Initiated Contact with Patient 09/10/12 1705     Chief Complaint  Patient presents with  . Back Pain  . Shortness of Breath   (Consider location/radiation/quality/duration/timing/severity/associated sxs/prior Treatment) Patient is a 37 y.o. female presenting with abdominal pain. The history is provided by the patient.  Abdominal Pain This is a new problem. Episode onset: 3 days ago. The problem occurs constantly. The problem has been gradually worsening. Associated symptoms include abdominal pain and shortness of breath. Pertinent negatives include no chest pain. Associated symptoms comments: Patient states she developed suprapubic abdominal pain with lower back pain 3 days ago. Also some mild dysuria. She denies any vaginal discharge she was sexually active several months ago.  She denies fever but is complaining of nausea without vomiting. Patient is a dialysis patient does say she last dialyzed on Monday due to not feeling well. Because she's not going to dialysis she's had some mild shortness of breath over the last day but denies any cough or chest pain. Exacerbated by: Urination. Nothing relieves the symptoms. She has tried nothing for the symptoms. The treatment provided no relief.   Past Medical History  Diagnosis Date  . Blood transfusion   . Preterm labor   . Dialysis patient   . Chronic kidney disease   . Hypertension    Past Surgical History  Procedure Laterality Date  . Renal biopsy    . Dilation and curettage of uterus    . Dilation and evacuation  08/02/2011    Procedure: DILATATION AND EVACUATION;  Surgeon: Logan Bores, MD;  Location: [facility];  Service: Gynecology;;   Family History  Problem Relation Age of Onset  . Hypertension    . Diabetes type II     History  Substance Use Topics  . Smoking status: Never Smoker   . Smokeless tobacco: Never Used  . Alcohol Use: Yes     Comment:  occ   OB History   Grav Para Term Preterm Abortions TAB SAB Ect Mult Living   5 2 1 1 1 1  0 0 0 1     Review of Systems  Constitutional: Negative for fever.  Respiratory: Positive for shortness of breath. Negative for cough.   Cardiovascular: Negative for chest pain and leg swelling.  Gastrointestinal: Positive for nausea, abdominal pain and diarrhea. Negative for vomiting.  Genitourinary: Positive for dysuria.  All other systems reviewed and are negative.    Allergies  Morphine and related and Prednisone  Home Medications   Current Outpatient Rx  Name  Route  Sig  Dispense  Refill  . calcium acetate (PHOSLO) 667 MG capsule   Oral   Take 2,668 mg by mouth 3 (three) times daily with meals.         . cinacalcet (SENSIPAR) 60 MG tablet   Oral   Take 60 mg by mouth daily.         Marland Kitchen esomeprazole (NEXIUM) 40 MG capsule   Oral   Take 40 mg by mouth 2 (two) times daily.          Marland Kitchen HYDROcodone-acetaminophen (NORCO/VICODIN) 5-325 MG per tablet   Oral   Take 0.5 tablets by mouth every 4 (four) hours as needed for pain (take 0.5 pills every 4-6 hours as needed for pain).   6 tablet   0    BP 112/78  Pulse 80  Temp(Src) 99 F (37.2 C) (  Oral)  Resp 17  SpO2 98%  LMP 08/23/2012 Physical Exam  Nursing note and vitals reviewed. Constitutional: She is oriented to person, place, and time. She appears well-developed and well-nourished. No distress.  HENT:  Head: Normocephalic and atraumatic.  Mouth/Throat: Oropharynx is clear and moist.  Eyes: Conjunctivae and EOM are normal. Pupils are equal, round, and reactive to light.  Neck: Normal range of motion. Neck supple.  Cardiovascular: Normal rate, regular rhythm and intact distal pulses.   No murmur heard. Pulmonary/Chest: Effort normal and breath sounds normal. No respiratory distress. She has no wheezes. She has no rales.  Abdominal: Soft. She exhibits no distension. There is tenderness in the suprapubic area. There is  no rebound and no guarding.  Genitourinary: Uterus is tender. Cervix exhibits discharge. Cervix exhibits no motion tenderness. Right adnexum displays no mass and no tenderness. Left adnexum displays no mass and no tenderness. Vaginal discharge found.  Musculoskeletal: Normal range of motion. She exhibits no edema and no tenderness.       Lumbar back: She exhibits tenderness.       Back:  Neurological: She is alert and oriented to person, place, and time.  Skin: Skin is warm and dry. No rash noted. No erythema.  Psychiatric: She has a normal mood and affect. Her behavior is normal.    ED Course  Procedures (including critical care time) Labs Reviewed  WET PREP, GENITAL - Abnormal; Notable for the following:    Trich, Wet Prep FEW (*)    Clue Cells Wet Prep HPF POC FEW (*)    WBC, Wet Prep HPF POC FEW (*)    All other components within normal limits  CBC WITH DIFFERENTIAL - Abnormal; Notable for the following:    RBC 3.66 (*)    Hemoglobin 11.6 (*)    HCT 34.4 (*)    All other components within normal limits  URINALYSIS, ROUTINE W REFLEX MICROSCOPIC - Abnormal; Notable for the following:    APPearance CLOUDY (*)    Glucose, UA 100 (*)    Hgb urine dipstick MODERATE (*)    Protein, ur 100 (*)    Leukocytes, UA SMALL (*)    All other components within normal limits  URINE MICROSCOPIC-ADD ON - Abnormal; Notable for the following:    Squamous Epithelial / LPF MANY (*)    Bacteria, UA MANY (*)    All other components within normal limits  GC/CHLAMYDIA PROBE AMP  POCT PREGNANCY, URINE   Dg Chest 2 View  09/10/2012   *RADIOLOGY REPORT*  Clinical Data: Shortness of breath.  CHEST - 2 VIEW  Comparison: 08/23/2009.  Findings: The heart, mediastinum and hilar contours are normal. The lungs are clear.  There are no effusions or pneumothoraces. There are no acute bony changes.  IMPRESSION: Normal chest.   Original Report Authenticated By: Vallery Ridge, M.D.     Date: 09/10/2012  Rate:  68  Rhythm: normal sinus rhythm  QRS Axis: normal  Intervals: normal  ST/T Wave abnormalities: normal  Conduction Disutrbances: none  Narrative Interpretation: unremarkable      1. Suprapubic pain, acute, right   2. Trichomonas vaginalis infection     MDM   Patient is here complaining of 3 days of suprapubic pain as well as generalized back pain and dysuria.  Patient is hemodynamically stable however her urine with blood and many bacteria but signs blood cells nitrites or leukocytes. Pelvic exam with no signs of PID or concern for ovarian pathology however wet prep  showed trichomonas present. Patient states that she has not had dialysis since Monday because she did not feel well. She did not appear to be fluid overloaded she has clear lung sounds and clear chest x-ray. However will check an i-STAT to ensure normal potassium.  We'll treat with Rocephin, azithromycin and Flagyl.  The trichomonas and possible STD.  Patient given pain control.  Potassium is normal at 5 and will have her f/u with dialysis on Monday.  Blanchie Dessert, MD 09/10/12 2013  Blanchie Dessert, MD 09/10/12 2015

## 2012-09-11 NOTE — ED Provider Notes (Signed)
Medical screening examination/treatment/procedure(s) were performed by non-physician practitioner and as supervising physician I was immediately available for consultation/collaboration.   Saddie Benders. Jaedyn Marrufo, MD 09/11/12 0003

## 2012-09-12 ENCOUNTER — Encounter (HOSPITAL_COMMUNITY): Payer: Self-pay | Admitting: Emergency Medicine

## 2012-09-12 ENCOUNTER — Emergency Department (HOSPITAL_COMMUNITY)
Admission: EM | Admit: 2012-09-12 | Discharge: 2012-09-13 | Disposition: A | Discharge status: Home / Self Care | Payer: Medicare Other | Attending: Emergency Medicine | Admitting: Emergency Medicine

## 2012-09-12 DIAGNOSIS — M79609 Pain in unspecified limb: Secondary | ICD-10-CM | POA: Insufficient documentation

## 2012-09-12 DIAGNOSIS — I120 Hypertensive chronic kidney disease with stage 5 chronic kidney disease or end stage renal disease: Secondary | ICD-10-CM | POA: Insufficient documentation

## 2012-09-12 DIAGNOSIS — Z8751 Personal history of pre-term labor: Secondary | ICD-10-CM | POA: Insufficient documentation

## 2012-09-12 DIAGNOSIS — R51 Headache: Secondary | ICD-10-CM | POA: Insufficient documentation

## 2012-09-12 DIAGNOSIS — R109 Unspecified abdominal pain: Secondary | ICD-10-CM | POA: Insufficient documentation

## 2012-09-12 DIAGNOSIS — N186 End stage renal disease: Secondary | ICD-10-CM | POA: Insufficient documentation

## 2012-09-12 DIAGNOSIS — M25579 Pain in unspecified ankle and joints of unspecified foot: Secondary | ICD-10-CM | POA: Insufficient documentation

## 2012-09-12 DIAGNOSIS — R112 Nausea with vomiting, unspecified: Secondary | ICD-10-CM | POA: Insufficient documentation

## 2012-09-12 DIAGNOSIS — M545 Low back pain, unspecified: Secondary | ICD-10-CM | POA: Insufficient documentation

## 2012-09-12 DIAGNOSIS — Z79899 Other long term (current) drug therapy: Secondary | ICD-10-CM | POA: Insufficient documentation

## 2012-09-12 DIAGNOSIS — R197 Diarrhea, unspecified: Secondary | ICD-10-CM | POA: Insufficient documentation

## 2012-09-12 DIAGNOSIS — R509 Fever, unspecified: Secondary | ICD-10-CM | POA: Insufficient documentation

## 2012-09-12 DIAGNOSIS — Z992 Dependence on renal dialysis: Secondary | ICD-10-CM | POA: Insufficient documentation

## 2012-09-12 LAB — GC/CHLAMYDIA PROBE AMP
CT Probe RNA: NEGATIVE
GC Probe RNA: NEGATIVE

## 2012-09-12 MED ORDER — SODIUM CHLORIDE 0.9 % IV BOLUS (SEPSIS)
1000.0000 mL | Freq: Once | INTRAVENOUS | Status: AC
  Administered 2012-09-13: 1000 mL via INTRAVENOUS

## 2012-09-12 NOTE — ED Notes (Signed)
Pt had dialysis today. Pt schedule is Monday wed friday

## 2012-09-12 NOTE — ED Notes (Signed)
Pt presents to the Ed with a complain of leg pain.  Pt states the pain starts at her waist and goes to her feet.   Pt rates her pain as 10 out of 10.  Pt is currently on antibiotics for c-diff.  Pt skin is wd.  Pt is alert and orientated.

## 2012-09-12 NOTE — ED Notes (Signed)
Pt did not take anything for pain today

## 2012-09-13 LAB — CBC WITH DIFFERENTIAL/PLATELET
HCT: 30.8 % — ABNORMAL LOW (ref 36.0–46.0)
Hemoglobin: 10.5 g/dL — ABNORMAL LOW (ref 12.0–15.0)
Lymphocytes Relative: 14 % (ref 12–46)
Lymphs Abs: 1.2 10*3/uL (ref 0.7–4.0)
MCHC: 34.1 g/dL (ref 30.0–36.0)
Monocytes Absolute: 0.7 10*3/uL (ref 0.1–1.0)
Monocytes Relative: 8 % (ref 3–12)
Neutro Abs: 6.5 10*3/uL (ref 1.7–7.7)
Neutrophils Relative %: 77 % (ref 43–77)
RBC: 3.31 MIL/uL — ABNORMAL LOW (ref 3.87–5.11)
WBC: 8.4 10*3/uL (ref 4.0–10.5)

## 2012-09-13 LAB — URINALYSIS, ROUTINE W REFLEX MICROSCOPIC
Bilirubin Urine: NEGATIVE
Ketones, ur: NEGATIVE mg/dL
Leukocytes, UA: NEGATIVE
Nitrite: NEGATIVE
Protein, ur: 100 mg/dL — AB
pH: 8 (ref 5.0–8.0)

## 2012-09-13 LAB — LIPASE, BLOOD: Lipase: 57 U/L (ref 11–59)

## 2012-09-13 LAB — COMPREHENSIVE METABOLIC PANEL
Alkaline Phosphatase: 37 U/L — ABNORMAL LOW (ref 39–117)
BUN: 31 mg/dL — ABNORMAL HIGH (ref 6–23)
CO2: 27 mEq/L (ref 19–32)
Chloride: 95 mEq/L — ABNORMAL LOW (ref 96–112)
Creatinine, Ser: 13.75 mg/dL — ABNORMAL HIGH (ref 0.50–1.10)
GFR calc non Af Amer: 3 mL/min — ABNORMAL LOW (ref >90)
Glucose, Bld: 94 mg/dL (ref 70–99)
Potassium: 4 mEq/L (ref 3.5–5.1)
Total Bilirubin: 0.2 mg/dL — ABNORMAL LOW (ref 0.3–1.2)

## 2012-09-13 MED ORDER — OXYCODONE-ACETAMINOPHEN 5-325 MG PO TABS: 1.0000 | ORAL_TABLET | Freq: Four times a day (QID) | ORAL | Status: DC | PRN

## 2012-09-13 MED ORDER — ACETAMINOPHEN 325 MG PO TABS
650.0000 mg | ORAL_TABLET | Freq: Once | ORAL | Status: AC
  Administered 2012-09-13: 650 mg via ORAL
  Filled 2012-09-13: qty 2

## 2012-09-13 MED ORDER — OXYCODONE-ACETAMINOPHEN 5-325 MG PO TABS
1.0000 | ORAL_TABLET | Freq: Once | ORAL | Status: AC
  Administered 2012-09-13: 1 via ORAL
  Filled 2012-09-13: qty 1

## 2012-09-13 MED ORDER — HYDROMORPHONE HCL PF 1 MG/ML IJ SOLN
1.0000 mg | Freq: Once | INTRAMUSCULAR | Status: AC
  Administered 2012-09-13: 1 mg via INTRAVENOUS
  Filled 2012-09-13: qty 1

## 2012-09-13 MED ORDER — ONDANSETRON HCL 4 MG/2ML IJ SOLN
4.0000 mg | Freq: Once | INTRAMUSCULAR | Status: AC
  Administered 2012-09-13: 4 mg via INTRAVENOUS
  Filled 2012-09-13: qty 2

## 2012-09-13 NOTE — ED Provider Notes (Signed)
Patient states she is feeling, better, she's ready for home.  She is hungry.  She would like something to eat.  She is requesting that I prescribed pain medication for her to take at home     Garald Balding, NP 09/13/12 1958

## 2012-09-13 NOTE — ED Notes (Signed)
Pt states pain is much better with the medication and she is now hungry and wanting food.

## 2012-09-13 NOTE — ED Notes (Signed)
Pt states her pain is increasing  in the lower abdomen bilaterally . Pain  no longer radiates down legs.Pt reports nausea .

## 2012-09-13 NOTE — ED Notes (Signed)
[id number] Expected date:<BR> Expected time:<BR> Means of arrival:<BR> Comments:<BR>

## 2012-09-13 NOTE — ED Provider Notes (Signed)
History    CSN: [id number] Arrival date & time 09/12/12  2243  First MD Initiated Contact with Patient 09/12/12 2352     Chief Complaint  Patient presents with  . Foot Pain  . Leg Pain   (Consider location/radiation/quality/duration/timing/severity/associated sxs/prior Treatment) HPI Desiree [name] is a 37 y.o. female who presents to ED with complaint of abdominal pain, nausea, vomiting, diarrhea, back pain, bilateral lower extremity pain onset today. States unable to keep anything down. States unable to take any medications for this. States was seen for the same two days ago, but symptoms just worsened today. States she is a dialysis pt, last dialized fully today. Denies chest pain or SOB, no URI symptoms, no urinary symptoms.  Past Medical History  Diagnosis Date  . Blood transfusion   . Preterm labor   . Dialysis patient   . Chronic kidney disease   . Hypertension    Past Surgical History  Procedure Laterality Date  . Renal biopsy    . Dilation and curettage of uterus    . Dilation and evacuation  08/02/2011    Procedure: DILATATION AND EVACUATION;  Surgeon: Logan Bores, MD;  Location: [facility];  Service: Gynecology;;   Family History  Problem Relation Age of Onset  . Hypertension    . Diabetes type II     History  Substance Use Topics  . Smoking status: Never Smoker   . Smokeless tobacco: Never Used  . Alcohol Use: Yes     Comment: occ   OB History   Grav Para Term Preterm Abortions TAB SAB Ect Mult Living   5 2 1 1 1 1  0 0 0 1     Review of Systems  Constitutional: Positive for fever and chills.  HENT: Negative for neck pain and neck stiffness.   Respiratory: Negative.   Cardiovascular: Negative.   Gastrointestinal: Positive for nausea, vomiting and abdominal pain.  Genitourinary: Negative for dysuria, flank pain and vaginal pain.  Musculoskeletal: Positive for myalgias, back pain and arthralgias.  Skin: Negative.   Neurological: Positive for  headaches. Negative for dizziness, weakness, light-headedness and numbness.    Allergies  Morphine and related and Prednisone  Home Medications   Current Outpatient Rx  Name  Route  Sig  Dispense  Refill  . calcium acetate (PHOSLO) 667 MG capsule   Oral   Take 2,668 mg by mouth 3 (three) times daily with meals.         . cinacalcet (SENSIPAR) 60 MG tablet   Oral   Take 60 mg by mouth daily.         Marland Kitchen esomeprazole (NEXIUM) 40 MG capsule   Oral   Take 40 mg by mouth 2 (two) times daily.          . metroNIDAZOLE (FLAGYL) 500 MG tablet   Oral   Take 1 tablet (500 mg total) by mouth 2 (two) times daily.   14 tablet   0   . oxyCODONE-acetaminophen (PERCOCET/ROXICET) 5-325 MG per tablet   Oral   Take 1 tablet by mouth every 6 (six) hours as needed for pain.   15 tablet   0    BP 130/90  Pulse 92  Temp(Src) 100.4 F (38 C) (Oral)  Resp 16  Ht 5\' 5"  (1.651 m)  Wt 171 lb 12.8 oz (77.928 kg)  BMI 28.59 kg/m2  SpO2 99%  LMP 08/23/2012 Physical Exam  Nursing note and vitals reviewed. Constitutional: She appears well-developed and well-nourished.  No distress.  Cardiovascular: Normal rate, regular rhythm and normal heart sounds.   Pulmonary/Chest: Effort normal and breath sounds normal. No respiratory distress. She has no wheezes. She has no rales.  Abdominal: Soft. Bowel sounds are normal. She exhibits no distension. There is tenderness. There is no rebound and no guarding.  LLQ and RLQ tenderness. No cva tenderness  Musculoskeletal: She exhibits no edema.  Tender over lumber midline and perivertebral spine. Pain with bilateral straight leg raise  Neurological: She is alert.  5/5 and equal lower extremity strength. 2+ and equal patellar reflexes bilaterally. Pt able to dorsiflex bilateral toes and feet with good strength against resistance. Equal sensation bilaterally over thighs and lower legs. Dorsal pedal pulses 2+ and equal bilaterally   Skin: Skin is warm and  dry.    ED Course  Procedures (including critical care time) 12:43 AM Pt seen and examined. Same complaints 2 days ago. Had full evaluation including pelvic exam. Treated for trichamonus, negative gc/chlamydia. Today low grade fever here in ED. Urine looked contaminated, no cultures sent. Will repeat UA. Labs. Pain medications and anti emetics given.   Results for orders placed during the hospital encounter of 09/12/12  CBC WITH DIFFERENTIAL      Result Value Range   WBC 8.4  4.0 - 10.5 K/uL   RBC 3.31 (*) 3.87 - 5.11 MIL/uL   Hemoglobin 10.5 (*) 12.0 - 15.0 g/dL   HCT 30.8 (*) 36.0 - 46.0 %   MCV 93.1  78.0 - 100.0 fL   MCH 31.7  26.0 - 34.0 pg   MCHC 34.1  30.0 - 36.0 g/dL   RDW 13.1  11.5 - 15.5 %   Platelets 191  150 - 400 K/uL   Neutrophils Relative % 77  43 - 77 %   Neutro Abs 6.5  1.7 - 7.7 K/uL   Lymphocytes Relative 14  12 - 46 %   Lymphs Abs 1.2  0.7 - 4.0 K/uL   Monocytes Relative 8  3 - 12 %   Monocytes Absolute 0.7  0.1 - 1.0 K/uL   Eosinophils Relative 1  0 - 5 %   Eosinophils Absolute 0.0  0.0 - 0.7 K/uL   Basophils Relative 0  0 - 1 %   Basophils Absolute 0.0  0.0 - 0.1 K/uL  COMPREHENSIVE METABOLIC PANEL      Result Value Range   Sodium 139  135 - 145 mEq/L   Potassium 4.0  3.5 - 5.1 mEq/L   Chloride 95 (*) 96 - 112 mEq/L   CO2 27  19 - 32 mEq/L   Glucose, Bld 94  70 - 99 mg/dL   BUN 31 (*) 6 - 23 mg/dL   Creatinine, Ser 13.75 (*) 0.50 - 1.10 mg/dL   Calcium 9.1  8.4 - 10.5 mg/dL   Total Protein 7.6  6.0 - 8.3 g/dL   Albumin 3.4 (*) 3.5 - 5.2 g/dL   AST 7  0 - 37 U/L   ALT <5  0 - 35 U/L   Alkaline Phosphatase 37 (*) 39 - 117 U/L   Total Bilirubin 0.2 (*) 0.3 - 1.2 mg/dL   GFR calc non Af Amer 3 (*) >90 mL/min   GFR calc Af Amer 3 (*) >90 mL/min  LIPASE, BLOOD      Result Value Range   Lipase 57  11 - 59 U/L  URINALYSIS, ROUTINE W REFLEX MICROSCOPIC      Result Value Range   Color, Urine  YELLOW  YELLOW   APPearance CLEAR  CLEAR   Specific  Gravity, Urine 1.011  1.005 - 1.030   pH 8.0  5.0 - 8.0   Glucose, UA 100 (*) NEGATIVE mg/dL   Hgb urine dipstick LARGE (*) NEGATIVE   Bilirubin Urine NEGATIVE  NEGATIVE   Ketones, ur NEGATIVE  NEGATIVE mg/dL   Protein, ur 100 (*) NEGATIVE mg/dL   Urobilinogen, UA 0.2  0.0 - 1.0 mg/dL   Nitrite NEGATIVE  NEGATIVE   Leukocytes, UA NEGATIVE  NEGATIVE  URINE MICROSCOPIC-ADD ON      Result Value Range   Squamous Epithelial / LPF RARE  RARE   RBC / HPF 21-50  <3 RBC/hpf   Dg Chest 2 View  09/10/2012   *RADIOLOGY REPORT*  Clinical Data: Shortness of breath.  CHEST - 2 VIEW  Comparison: 08/23/2009.  Findings: The heart, mediastinum and hilar contours are normal. The lungs are clear.  There are no effusions or pneumothoraces. There are no acute bony changes.  IMPRESSION: Normal chest.   Original Report Authenticated By: Vallery Ridge, M.D.      No results found. No diagnosis found.  MDM  Pt with lower abdominal pain, back pain, pain radiating into legs. Labs ordered. UA ordered. Pt signed out to NP schulz at shift change.   Renold Genta, PA-C 09/15/12 1512

## 2012-09-15 NOTE — ED Provider Notes (Signed)
Medical screening examination/treatment/procedure(s) were performed by non-physician practitioner and as supervising physician I was immediately available for consultation/collaboration.  Teressa Lower, MD 09/15/12 [phone]

## 2012-09-19 NOTE — ED Provider Notes (Signed)
Medical screening examination/treatment/procedure(s) were performed by non-physician practitioner and as supervising physician I was immediately available for consultation/collaboration.  Virgel Manifold, MD 09/19/12 [phone]

## 2012-11-07 ENCOUNTER — Emergency Department (HOSPITAL_COMMUNITY)
Admission: EM | Admit: 2012-11-07 | Discharge: 2012-11-07 | Disposition: A | Discharge status: Home / Self Care | Payer: Medicare Other | Attending: Emergency Medicine | Admitting: Emergency Medicine

## 2012-11-07 ENCOUNTER — Emergency Department (HOSPITAL_COMMUNITY): Payer: Medicare Other

## 2012-11-07 ENCOUNTER — Encounter (HOSPITAL_COMMUNITY): Payer: Self-pay | Admitting: Emergency Medicine

## 2012-11-07 DIAGNOSIS — K118 Other diseases of salivary glands: Secondary | ICD-10-CM

## 2012-11-07 DIAGNOSIS — N189 Chronic kidney disease, unspecified: Secondary | ICD-10-CM | POA: Insufficient documentation

## 2012-11-07 DIAGNOSIS — Z792 Long term (current) use of antibiotics: Secondary | ICD-10-CM | POA: Insufficient documentation

## 2012-11-07 DIAGNOSIS — Z992 Dependence on renal dialysis: Secondary | ICD-10-CM | POA: Insufficient documentation

## 2012-11-07 DIAGNOSIS — K0889 Other specified disorders of teeth and supporting structures: Secondary | ICD-10-CM | POA: Insufficient documentation

## 2012-11-07 DIAGNOSIS — R112 Nausea with vomiting, unspecified: Secondary | ICD-10-CM | POA: Insufficient documentation

## 2012-11-07 DIAGNOSIS — I129 Hypertensive chronic kidney disease with stage 1 through stage 4 chronic kidney disease, or unspecified chronic kidney disease: Secondary | ICD-10-CM | POA: Insufficient documentation

## 2012-11-07 DIAGNOSIS — K1120 Sialoadenitis, unspecified: Secondary | ICD-10-CM | POA: Insufficient documentation

## 2012-11-07 DIAGNOSIS — Z79899 Other long term (current) drug therapy: Secondary | ICD-10-CM | POA: Insufficient documentation

## 2012-11-07 DIAGNOSIS — H9209 Otalgia, unspecified ear: Secondary | ICD-10-CM | POA: Insufficient documentation

## 2012-11-07 MED ORDER — CEPHALEXIN 500 MG PO CAPS: 500.0000 mg | ORAL_CAPSULE | Freq: Four times a day (QID) | ORAL | Status: DC

## 2012-11-07 MED ORDER — TRAMADOL HCL 50 MG PO TABS
50.0000 mg | ORAL_TABLET | Freq: Once | ORAL | Status: AC
  Administered 2012-11-07: 50 mg via ORAL
  Filled 2012-11-07: qty 1

## 2012-11-07 MED ORDER — TRAMADOL HCL 50 MG PO TABS: 50.0000 mg | ORAL_TABLET | Freq: Four times a day (QID) | ORAL | Status: DC | PRN

## 2012-11-07 NOTE — ED Notes (Signed)
Rt side facial swelling for 2 days now with rt ear pain, no weakness, alert x4, no slurred speeach, no arm weakness.

## 2012-11-07 NOTE — ED Provider Notes (Signed)
CSN: [id number]     Arrival date & time 11/07/12  1217 History     First MD Initiated Contact with Patient 11/07/12 1330     Chief Complaint  Patient presents with  . Facial Swelling   (Consider location/radiation/quality/duration/timing/severity/associated sxs/prior Treatment) HPI Pt is a 37yo female c/o right sided facial pain and swelling x2 days.  Describes pain as sharp, aching, and throbbing, 10/10 worse with palpation and chewing.  Pain radiates to right ear.  Denies fever, n/v, trauma to area.  Denies dental pain.  Denies hx of similar pain.  Has not taken anything for pain.     Past Medical History  Diagnosis Date  . Blood transfusion   . Preterm labor   . Dialysis patient   . Chronic kidney disease   . Hypertension    Past Surgical History  Procedure Laterality Date  . Renal biopsy    . Dilation and curettage of uterus    . Dilation and evacuation  08/02/2011    Procedure: DILATATION AND EVACUATION;  Surgeon: Logan Bores, MD;  Location: [facility];  Service: Gynecology;;   Family History  Problem Relation Age of Onset  . Hypertension    . Diabetes type II     History  Substance Use Topics  . Smoking status: Never Smoker   . Smokeless tobacco: Never Used  . Alcohol Use: Yes     Comment: occ   OB History   Grav Para Term Preterm Abortions TAB SAB Ect Mult Living   5 2 1 1 1 1  0 0 0 1     Review of Systems  Constitutional: Negative for fever and chills.  HENT: Positive for ear pain, facial swelling and dental problem. Negative for sore throat, trouble swallowing and neck pain.   Gastrointestinal: Positive for nausea and vomiting.  All other systems reviewed and are negative.    Allergies  Morphine and related and Prednisone  Home Medications   Current Outpatient Rx  Name  Route  Sig  Dispense  Refill  . calcium acetate, Phos Binder, (PHOSLYRA) 667 MG/5ML SOLN   Oral   Take 2,001 mg by mouth daily.         . cinacalcet (SENSIPAR) 60 MG  tablet   Oral   Take 60 mg by mouth daily.         Marland Kitchen esomeprazole (NEXIUM) 40 MG capsule   Oral   Take 40 mg by mouth 2 (two) times daily.          . cephALEXin (KEFLEX) 500 MG capsule   Oral   Take 1 capsule (500 mg total) by mouth 4 (four) times daily.   40 capsule   0   . traMADol (ULTRAM) 50 MG tablet   Oral   Take 1 tablet (50 mg total) by mouth every 6 (six) hours as needed for pain.   15 tablet   0    BP 139/87  Pulse 103  Temp(Src) 99.8 F (37.7 C) (Oral)  Resp 16  Ht 5\' 5"  (1.651 m)  Wt 169 lb (76.658 kg)  BMI 28.12 kg/m2  SpO2 99%  LMP 10/24/2012 Physical Exam  Nursing note and vitals reviewed. Constitutional: She appears well-developed and well-nourished. No distress.  HENT:  Head: Normocephalic and atraumatic.  Eyes: Conjunctivae are normal. No scleral icterus.  Neck: Normal range of motion. Neck supple.    Pain, tenderness, and swelling along right TMJ   Cardiovascular: Normal rate, regular rhythm and normal heart sounds.  Pulmonary/Chest: Effort normal and breath sounds normal. No respiratory distress. She has no wheezes. She has no rales. She exhibits no tenderness.  Abdominal: Soft. Bowel sounds are normal. She exhibits no distension and no mass. There is no tenderness. There is no rebound and no guarding.  Musculoskeletal: Normal range of motion.  Neurological: She is alert.  Skin: Skin is warm and dry. She is not diaphoretic.    ED Course   Procedures (including critical care time)  Labs Reviewed - No data to display Ct Soft Tissue Neck Wo Contrast  11/07/2012   *RADIOLOGY REPORT*  Clinical Data: Right-sided facial swelling.  Ear pain.  CT NECK WITHOUT CONTRAST  Technique:  Multidetector CT imaging of the neck was performed without intravenous contrast.  Comparison: None.  Findings: A 10 x 13 x 11 mm hyperdense nodule is present at the inferior lateral aspect of the right parotid gland.  Other smaller hyperdense lesions are most compatible  with lymph nodes.  An intraparotid lymph node on the left knee at the tail measures 12 mm maximally.  There is slight stranding over the right parotid gland.  The background parenchyma is symmetric.  This there is no ductal dilation.  Asymmetric sub centimeter right level II lymph nodes are noted.  These appear benign.  A jugulodigastric lymph node measures 28 mm but is ovoid in shape and contains a fatty hilum.  Benign- appearing level I lymph nodes are present bilaterally.  No significant mucosal or submucosal lesions are present.  The thyroid is unremarkable.  The vocal cords are midline and symmetric.  Bone windows demonstrate no focal lytic or blastic lesions.  There is straightening and some reversal of the normal cervical lordosis.  IMPRESSION:  1.  13 mm hyperdense nodule near the tail of the right parotid gland is concerning for a benign neoplasm such is a benign mixed tumor or Warthin's tumor. Malignancy cannot be excluded on the basis of imaging. 2.  Multiple smaller hyperdense nodules within the right parotid and a 12 mm lesion in the left parotid have a more characteristic appearance of lymph nodes. 3.  Mild inflammatory changes are present about the right parotid gland.  There may be an underlying parotitis as well or inflammatory changes associated with the mass. 4.  Asymmetric right level I and level II lymph nodes appear reactive.   Original Report Authenticated By: San Morelle, M.D.   1. Parotitis   2. Parotid nodule     MDM  Pt has signs and symptoms consistent with parotitis.  Discussed pt with Dr. Rolland Porter, will tx for parotitis with pain control, Rx: tramadol, and encouraged pt to suck on sour candy as this will help keep salivary duct open, aiding in healing time.  Due to nodules found on CT neck, pt advised to f/u with ENT for further evaluation and treatment.  Return precautions provided.  Pt verbalized understanding and agreement with tx plan.    Noland Fordyce,  PA-C 11/08/12 1552

## 2012-11-08 NOTE — ED Provider Notes (Signed)
Medical screening examination/treatment/procedure(s) were performed by non-physician practitioner and as supervising physician I was immediately available for consultation/collaboration. Rolland Porter, MD, FACEP   Janice Norrie, MD 11/08/12 [phone]

## 2013-01-25 ENCOUNTER — Emergency Department (HOSPITAL_COMMUNITY)
Admission: EM | Admit: 2013-01-25 | Discharge: 2013-01-25 | Disposition: A | Discharge status: Home / Self Care | Payer: Medicare Other | Attending: Emergency Medicine | Admitting: Emergency Medicine

## 2013-01-25 ENCOUNTER — Emergency Department (HOSPITAL_COMMUNITY): Payer: Medicare Other

## 2013-01-25 DIAGNOSIS — M549 Dorsalgia, unspecified: Secondary | ICD-10-CM

## 2013-01-25 DIAGNOSIS — Z992 Dependence on renal dialysis: Secondary | ICD-10-CM | POA: Insufficient documentation

## 2013-01-25 DIAGNOSIS — E119 Type 2 diabetes mellitus without complications: Secondary | ICD-10-CM | POA: Insufficient documentation

## 2013-01-25 DIAGNOSIS — I120 Hypertensive chronic kidney disease with stage 5 chronic kidney disease or end stage renal disease: Secondary | ICD-10-CM | POA: Insufficient documentation

## 2013-01-25 DIAGNOSIS — R519 Headache, unspecified: Secondary | ICD-10-CM

## 2013-01-25 DIAGNOSIS — N186 End stage renal disease: Secondary | ICD-10-CM | POA: Insufficient documentation

## 2013-01-25 DIAGNOSIS — Z79899 Other long term (current) drug therapy: Secondary | ICD-10-CM | POA: Insufficient documentation

## 2013-01-25 DIAGNOSIS — M545 Low back pain, unspecified: Secondary | ICD-10-CM | POA: Insufficient documentation

## 2013-01-25 DIAGNOSIS — R51 Headache: Secondary | ICD-10-CM | POA: Insufficient documentation

## 2013-01-25 MED ORDER — OXYCODONE-ACETAMINOPHEN 5-325 MG PO TABS
2.0000 | ORAL_TABLET | Freq: Once | ORAL | Status: AC
  Administered 2013-01-25: 2 via ORAL
  Filled 2013-01-25: qty 2

## 2013-01-25 NOTE — ED Provider Notes (Signed)
CSN: [id number]     Arrival date & time 01/25/13  1414 History   First MD Initiated Contact with Patient 01/25/13 1427     This chart was scribed for Noland Fordyce by Lovena Le Day, ED scribe. This patient was seen in room [place] and the patient's care was started at 1427.  No chief complaint on file.  The history is provided by the patient. No language interpreter was used.   HPI Comments: Desiree [name] is a 37 y.o. female who presents to the Emergency Department complaining of frontal HA, onset 7 days ago. She states her HA improved at one ponit for a few hours, but has been constant since onset. She states tried advil, Excedrin, tylenol, cold compress, vapor rub and hot shower for her HA w/out any relief. States she has been taking medicine like Excedrin every 2 hours.  She also c/o bilateral lower back pain for 3 weeks which she told the physician at her dialysis treatments and rx w/muslce relaxer which she states did not improve her pain. Back pain is aching, 8/10, made worse with certain movements but denies known injury. Denies numbness or tingling in legs, denies change in bowel or bladder habits. She states x1 emesis episode 7 days ago but no longer feels nauseous. She got the flu shot this year. Denies fever or sick contacts. Denies change in vision or balance. Denies previous hx of headaches. She denies CP and SOB. She states some right sided ear pain.   Dialysis Monday, Wednesday  And Friday; she did not go today.  Past Medical History  Diagnosis Date  . Blood transfusion   . Preterm labor   . Dialysis patient   . Chronic kidney disease   . Hypertension    Past Surgical History  Procedure Laterality Date  . Renal biopsy    . Dilation and curettage of uterus    . Dilation and evacuation  08/02/2011    Procedure: DILATATION AND EVACUATION;  Surgeon: Logan Bores, MD;  Location: [facility];  Service: Gynecology;;   Family History  Problem Relation Age of Onset  .  Hypertension    . Diabetes type II     History  Substance Use Topics  . Smoking status: Never Smoker   . Smokeless tobacco: Never Used  . Alcohol Use: Yes     Comment: occ   OB History   Grav Para Term Preterm Abortions TAB SAB Ect Mult Living   5 2 1 1 1 1  0 0 0 1     Review of Systems  Musculoskeletal: Positive for back pain (bilateral lower back pain).  Neurological: Positive for headaches.  All other systems reviewed and are negative.   A complete 10 system review of systems was obtained and all systems are negative except as noted in the HPI and PMH.   Allergies  Morphine and related and Prednisone  Home Medications   Current Outpatient Rx  Name  Route  Sig  Dispense  Refill  . calcium acetate (PHOSLO) 667 MG capsule   Oral   Take 2,001 mg by mouth 3 (three) times daily with meals.         . calcium acetate, Phos Binder, (PHOSLYRA) 667 MG/5ML SOLN   Oral   Take 2,001 mg by mouth daily.         . cinacalcet (SENSIPAR) 60 MG tablet   Oral   Take 60 mg by mouth daily.         Marland Kitchen  esomeprazole (NEXIUM) 40 MG capsule   Oral   Take 40 mg by mouth daily as needed (for heartburn).           BP 135/86  Pulse 95  Temp(Src) 98 F (36.7 C) (Oral)  Resp 16  SpO2 99%  LMP 01/15/2013 Physical Exam  Nursing note and vitals reviewed. Constitutional: She is oriented to person, place, and time. She appears well-developed and well-nourished. No distress.  Watching TV at a high volume and playing on her cell phone in room. NAD.  HENT:  Head: Normocephalic and atraumatic.  Eyes: EOM are normal.  Neck: Normal range of motion.  Cardiovascular: Normal rate, regular rhythm and normal heart sounds.   Pulmonary/Chest: Effort normal and breath sounds normal. No respiratory distress. She has no wheezes. She has no rales. She exhibits no tenderness.  Abdominal: Soft. Bowel sounds are normal. She exhibits no distension and no mass. There is no tenderness. There is no  rebound and no guarding.  Musculoskeletal: Normal range of motion. She exhibits tenderness.  Thoracic and upper paraspinal muscles. Point tenderness around T12-L1. No step offs or crepitus.  No ecchymosis or erythema.  Neurological: She is alert and oriented to person, place, and time. No cranial nerve deficit.  A&Ox4, no ataxia, no focal deficit.  Skin: Skin is warm and dry. She is not diaphoretic.  Psychiatric: She has a normal mood and affect. Her behavior is normal.    ED Course  Procedures (including critical care time)  Labs Review Labs Reviewed - No data to display Imaging Review Dg Thoracic Spine 2 View  01/25/2013   CLINICAL DATA:  Mid back pain without injury  EXAM: THORACIC SPINE - 2 VIEW  COMPARISON:  None.  FINDINGS: There is no evidence of thoracic spine fracture. Alignment is normal. No other significant bone abnormalities are identified.  IMPRESSION: No acute abnormality noted.   Electronically Signed   By: Inez Catalina M.D.   On: 01/25/2013 15:23   Dg Lumbar Spine Complete  01/25/2013   CLINICAL DATA:  Low back pain  EXAM: LUMBAR SPINE - COMPLETE 4+ VIEW  COMPARISON:  08/07/2012  FINDINGS: Five lumbar type vertebral bodies are well visualized. Vertebral body height is well maintained. No spondylolysis or spondylolisthesis is noted.  IMPRESSION: No acute abnormality noted.   Electronically Signed   By: Inez Catalina M.D.   On: 01/25/2013 15:24    EKG Interpretation   None       MDM   1. Headache   2. Back pain    Pt is a 37yo female c/o persistent headache x7 days and backache x3 weeks w/o known injury.  No red flag symptoms. On exam, no focal neuro deficit or ataxia. Pt is watching television at a high sound volume and playing with her phone. Does not appear to be in any distress.  Not concerned for emergent process taking place at this time. Do not believe further workup of headache needed at this time.  Some concern persistent headache may be due to pt's frequent  use of medication as she reports using Excedrin and other medications up to every 2 hours over the last week. Plain films of thoracic and lumbar spine done due to duration of back pain, and no recent imaging in the system.  Plain films: no acute abnormality noted.  Vertebral body height well maintained. No spondylolysis or spondylolithesis noted.  Advised pt to speak with PCP for referral to PT or pain management as there is no  indication for narcotic prescriptions at this time.  Advised pt to eat well, get plenty of sleep and to cut back on pain medication as this may be causing medicine induced rebound headaches. Return precautions provided. Pt verbalized understanding and agreement with tx plan.    I personally performed the services described in this documentation, which was scribed in my presence. The recorded information has been reviewed and is accurate.      Noland Fordyce, PA-C 01/26/13 [phone]

## 2013-01-27 NOTE — ED Provider Notes (Signed)
Medical screening examination/treatment/procedure(s) were performed by non-physician practitioner and as supervising physician I was immediately available for consultation/collaboration.  EKG Interpretation   None        Nat Christen, MD 01/27/13 [phone]

## 2013-02-07 ENCOUNTER — Other Ambulatory Visit: Payer: Self-pay | Admitting: Vascular Surgery

## 2013-02-07 DIAGNOSIS — T82598A Other mechanical complication of other cardiac and vascular devices and implants, initial encounter: Secondary | ICD-10-CM

## 2013-02-10 ENCOUNTER — Emergency Department (HOSPITAL_COMMUNITY)
Admission: EM | Admit: 2013-02-10 | Discharge: 2013-02-10 | Disposition: A | Discharge status: Home / Self Care | Payer: Medicare Other | Attending: Emergency Medicine | Admitting: Emergency Medicine

## 2013-02-10 ENCOUNTER — Encounter (HOSPITAL_COMMUNITY): Payer: Self-pay | Admitting: Emergency Medicine

## 2013-02-10 ENCOUNTER — Encounter (HOSPITAL_COMMUNITY): Payer: Self-pay | Admitting: *Deleted

## 2013-02-10 DIAGNOSIS — Z992 Dependence on renal dialysis: Secondary | ICD-10-CM | POA: Insufficient documentation

## 2013-02-10 DIAGNOSIS — T82898A Other specified complication of vascular prosthetic devices, implants and grafts, initial encounter: Secondary | ICD-10-CM

## 2013-02-10 DIAGNOSIS — N186 End stage renal disease: Secondary | ICD-10-CM | POA: Insufficient documentation

## 2013-02-10 DIAGNOSIS — T827XXA Infection and inflammatory reaction due to other cardiac and vascular devices, implants and grafts, initial encounter: Secondary | ICD-10-CM

## 2013-02-10 DIAGNOSIS — Z8751 Personal history of pre-term labor: Secondary | ICD-10-CM | POA: Insufficient documentation

## 2013-02-10 DIAGNOSIS — Y832 Surgical operation with anastomosis, bypass or graft as the cause of abnormal reaction of the patient, or of later complication, without mention of misadventure at the time of the procedure: Secondary | ICD-10-CM | POA: Insufficient documentation

## 2013-02-10 DIAGNOSIS — I120 Hypertensive chronic kidney disease with stage 5 chronic kidney disease or end stage renal disease: Secondary | ICD-10-CM | POA: Insufficient documentation

## 2013-02-10 MED ORDER — OXYCODONE-ACETAMINOPHEN 5-325 MG PO TABS: 1.0000 | ORAL_TABLET | ORAL | Status: DC | PRN

## 2013-02-10 NOTE — ED Provider Notes (Signed)
Medical screening examination/treatment/procedure(s) were performed by non-physician practitioner and as supervising physician I was immediately available for consultation/collaboration.  Richarda Blade, MD 02/10/13 [phone]

## 2013-02-10 NOTE — ED Notes (Signed)
Pt c/o possible abscess or area to fistula; pt sts some bleeding from site when burst x 2 week intermittently; pt told to come here for eval

## 2013-02-10 NOTE — Progress Notes (Signed)
Patient ID: Desiree [name], female   DOB: July 04, 1975, 37 y.o.   MRN: [id number] The patient presents today to the emergency department after a call from dialysis Center stating that she had a raised area on her left AV fistula. The patient has had this fistula since 2010. She was using a buttonhole technique in the more distal area towards the wrist had been inflamed and she had not been using this area for access several weeks. There had been some oozing from this and some blistering over this. This was reported to [place] at the dialysis center today and I asked that she be transferred to [facility] in the emergency department for further evaluation and possible surgical treatment. The patient was told to be n.p.o. Unfortunately despite this she ate a sandwich and bag of potato chips on her way from the dialysis center to the hospital.  Past Medical History  Diagnosis Date  . Blood transfusion   . Preterm labor   . Dialysis patient   . Chronic kidney disease   . Hypertension     History  Substance Use Topics  . Smoking status: Never Smoker   . Smokeless tobacco: Never Used  . Alcohol Use: Yes     Comment: occ    Family History  Problem Relation Age of Onset  . Hypertension    . Diabetes type II      Allergies  Allergen Reactions  . Morphine And Related Anaphylaxis    Patient says she stops breathing Can take percocet  . Prednisone Other (See Comments)    Patient says prednisone causes her to cramp all over, muscle spasms uncontrolled    No current facility-administered medications for this encounter. Current outpatient prescriptions:B Complex-C-Folic Acid (NEPHRO-VITE PO), Take 1 tablet by mouth daily. , Disp: , Rfl: ;  calcium acetate (PHOSLO) 667 MG capsule, Take 2,668 mg by mouth 3 (three) times daily with meals. , Disp: , Rfl: ;  cinacalcet (SENSIPAR) 60 MG tablet, Take 60 mg by mouth daily., Disp: , Rfl: ;  lanthanum (FOSRENOL) 1000 MG chewable tablet, Chew 500 mg by  mouth 2 (two) times daily with a meal., Disp: , Rfl:   BP 152/86  Pulse 86  Temp(Src) 98.5 F (36.9 C) (Oral)  Resp 22  Ht 5\' 5"  (1.651 m)  Wt 179 lb (81.194 kg)  BMI 29.79 kg/m2  SpO2 100%  LMP 01/15/2013  Body mass index is 29.79 kg/(m^2).    Physical exam: Well-developed well-nourished female in no acute distress Respirations are nonlabored Her left arm does reveal Cimino radiocephalic AV fistula. There is an area approximately 2 cm proximal to the AV anastomosis where she has approximately 1 cm area of swelling and superficial skin breakdown. There is tenderness over this as well. The upper area of buttonhole and the rest of her fistula and has no evidence of breakdown.  Impression and plan aeration of AV fistula at the area approximately 2-3 cm above her AV anastomosis. I explained to the patient that this will have to have excision. I explained there is a chance that this will have to be ligation of her fistula and new access placement. Hopefully this can be avoided hopefully we can preserve the fistula. She does not have any evidence of current bleeding this been present for some time. I have scheduled for surgery for 7:30 in the morning. Unfortunately she did eat. It is approximately 3 PM now. We will of wrap her wrist with an Ace wrap and  she was instructed on treatment should she have any bleeding from this. She was given a prescription for Percocet #20 for pain she understands to be n.p.o. after midnight

## 2013-02-10 NOTE — ED Notes (Signed)
Dr. Donnetta Hutching came and saw patient.   Advised to discharge home that patient had eaten and cannot do surgery until tomorrow.

## 2013-02-10 NOTE — ED Provider Notes (Signed)
Pt sent to the ED for evaluation of possible infected dialysis graft.  Pt was evaluated by vascular surgery, Dr. Donnetta Hutching, immediately upon arrival to the ED.  Pt was to be taken to the OR today but she ate despite being told not to.  Will return for operation tomorrow morning.  Wound was wrapped to prevent further bleeding.  Instructed by Dr. Donnetta Hutching to discharge pt to home with pain meds.  NPO after midnight in prep for surgery.  Discussed plan with pt, she agreed.  Return precautions advised.  Larene Pickett, PA-C 02/10/13 1456

## 2013-02-11 ENCOUNTER — Encounter (HOSPITAL_COMMUNITY)
Admission: RE | Disposition: A | Discharge status: Home / Self Care | Payer: Self-pay | Source: Ambulatory Visit | Attending: Vascular Surgery

## 2013-02-11 ENCOUNTER — Encounter (HOSPITAL_COMMUNITY): Payer: Medicare Other | Admitting: Anesthesiology

## 2013-02-11 ENCOUNTER — Encounter (HOSPITAL_COMMUNITY): Payer: Self-pay | Admitting: *Deleted

## 2013-02-11 ENCOUNTER — Inpatient Hospital Stay (HOSPITAL_COMMUNITY): Payer: Medicare Other | Admitting: Anesthesiology

## 2013-02-11 ENCOUNTER — Ambulatory Visit (HOSPITAL_COMMUNITY)
Admission: RE | Admit: 2013-02-11 | Discharge: 2013-02-11 | Disposition: A | Discharge status: Home / Self Care | Payer: Medicare Other | Source: Ambulatory Visit | Attending: Vascular Surgery | Admitting: Vascular Surgery

## 2013-02-11 DIAGNOSIS — T82898A Other specified complication of vascular prosthetic devices, implants and grafts, initial encounter: Secondary | ICD-10-CM

## 2013-02-11 DIAGNOSIS — Y832 Surgical operation with anastomosis, bypass or graft as the cause of abnormal reaction of the patient, or of later complication, without mention of misadventure at the time of the procedure: Secondary | ICD-10-CM | POA: Insufficient documentation

## 2013-02-11 DIAGNOSIS — Z992 Dependence on renal dialysis: Secondary | ICD-10-CM | POA: Insufficient documentation

## 2013-02-11 DIAGNOSIS — N186 End stage renal disease: Secondary | ICD-10-CM

## 2013-02-11 DIAGNOSIS — I868 Varicose veins of other specified sites: Secondary | ICD-10-CM | POA: Insufficient documentation

## 2013-02-11 DIAGNOSIS — I120 Hypertensive chronic kidney disease with stage 5 chronic kidney disease or end stage renal disease: Secondary | ICD-10-CM | POA: Insufficient documentation

## 2013-02-11 HISTORY — PX: REVISION OF ARTERIOVENOUS GORETEX GRAFT: SHX6073

## 2013-02-11 HISTORY — DX: Gastro-esophageal reflux disease without esophagitis: K21.9

## 2013-02-11 LAB — POCT I-STAT 4, (NA,K, GLUC, HGB,HCT)
HCT: 31 % — ABNORMAL LOW (ref 36.0–46.0)
Hemoglobin: 10.5 g/dL — ABNORMAL LOW (ref 12.0–15.0)

## 2013-02-11 LAB — HCG, SERUM, QUALITATIVE: Preg, Serum: NEGATIVE

## 2013-02-11 SURGERY — REVISION OF ARTERIOVENOUS GORETEX GRAFT
Anesthesia: General | Site: Arm Lower | Laterality: Left | Wound class: Clean

## 2013-02-11 MED ORDER — OXYCODONE-ACETAMINOPHEN 5-325 MG PO TABS: 1.0000 | ORAL_TABLET | ORAL | Status: DC | PRN

## 2013-02-11 MED ORDER — FENTANYL CITRATE 0.05 MG/ML IJ SOLN
25.0000 ug | INTRAMUSCULAR | Status: DC | PRN
  Administered 2013-02-11 (×2): 50 ug via INTRAVENOUS

## 2013-02-11 MED ORDER — FENTANYL CITRATE 0.05 MG/ML IJ SOLN: 50.0000 ug | Freq: Once | INTRAMUSCULAR | Status: DC

## 2013-02-11 MED ORDER — MIDAZOLAM HCL 2 MG/2ML IJ SOLN
1.0000 mg | INTRAMUSCULAR | Status: DC | PRN
  Administered 2013-02-11: 2 mg via INTRAVENOUS

## 2013-02-11 MED ORDER — SODIUM CHLORIDE 0.9 % IV SOLN
INTRAVENOUS | Status: DC
  Administered 2013-02-11 (×2): via INTRAVENOUS

## 2013-02-11 MED ORDER — PROPOFOL 10 MG/ML IV BOLUS
INTRAVENOUS | Status: DC | PRN
  Administered 2013-02-11: 150 mg via INTRAVENOUS
  Administered 2013-02-11: 40 mg via INTRAVENOUS
  Administered 2013-02-11: 50 mg via INTRAVENOUS

## 2013-02-11 MED ORDER — DEXTROSE 5 % IV SOLN
1.5000 g | Freq: Once | INTRAVENOUS | Status: AC
  Administered 2013-02-11: 1.5 g via INTRAVENOUS
  Filled 2013-02-11: qty 1.5

## 2013-02-11 MED ORDER — OXYCODONE-ACETAMINOPHEN 5-325 MG PO TABS
1.0000 | ORAL_TABLET | ORAL | Status: DC | PRN
  Administered 2013-02-11: 2 via ORAL

## 2013-02-11 MED ORDER — SODIUM CHLORIDE 0.9 % IR SOLN
Status: DC | PRN
  Administered 2013-02-11: 1000 mL

## 2013-02-11 MED ORDER — PROMETHAZINE HCL 25 MG/ML IJ SOLN: 6.2500 mg | INTRAMUSCULAR | Status: DC | PRN

## 2013-02-11 MED ORDER — LIDOCAINE-EPINEPHRINE 0.5 %-1:200000 IJ SOLN
INTRAMUSCULAR | Status: DC | PRN
  Administered 2013-02-11: 2 mL

## 2013-02-11 MED ORDER — FENTANYL CITRATE 0.05 MG/ML IJ SOLN
INTRAMUSCULAR | Status: AC
  Administered 2013-02-11: 50 ug via INTRAVENOUS
  Filled 2013-02-11: qty 2

## 2013-02-11 MED ORDER — LIDOCAINE HCL (CARDIAC) 20 MG/ML IV SOLN
INTRAVENOUS | Status: DC | PRN
  Administered 2013-02-11: 80 mg via INTRAVENOUS

## 2013-02-11 MED ORDER — SODIUM CHLORIDE 0.9 % IR SOLN
Status: DC | PRN
  Administered 2013-02-11: 09:00:00

## 2013-02-11 MED ORDER — LIDOCAINE-EPINEPHRINE 0.5 %-1:200000 IJ SOLN
INTRAMUSCULAR | Status: AC
  Filled 2013-02-11: qty 1

## 2013-02-11 MED ORDER — OXYCODONE-ACETAMINOPHEN 5-325 MG PO TABS
ORAL_TABLET | ORAL | Status: AC
  Administered 2013-02-11: 2 via ORAL
  Filled 2013-02-11: qty 2

## 2013-02-11 MED ORDER — FENTANYL CITRATE 0.05 MG/ML IJ SOLN
INTRAMUSCULAR | Status: DC | PRN
  Administered 2013-02-11 (×2): 100 ug via INTRAVENOUS
  Administered 2013-02-11: 50 ug via INTRAVENOUS

## 2013-02-11 MED ORDER — HEPARIN SODIUM (PORCINE) 1000 UNIT/ML IJ SOLN
INTRAMUSCULAR | Status: AC
  Filled 2013-02-11: qty 1

## 2013-02-11 SURGICAL SUPPLY — 51 items
BAG DECANTER FOR FLEXI CONT (MISCELLANEOUS) ×3 IMPLANT
BENZOIN TINCTURE PRP APPL 2/3 (GAUZE/BANDAGES/DRESSINGS) ×3 IMPLANT
CANISTER SUCTION 2500CC (MISCELLANEOUS) ×3 IMPLANT
CATH CANNON HEMO 15F 50CM (CATHETERS) IMPLANT
CATH CANNON HEMO 15FR 19 (HEMODIALYSIS SUPPLIES) IMPLANT
CATH CANNON HEMO 15FR 23CM (HEMODIALYSIS SUPPLIES) IMPLANT
CATH CANNON HEMO 15FR 31CM (HEMODIALYSIS SUPPLIES) IMPLANT
CATH CANNON HEMO 15FR 32CM (HEMODIALYSIS SUPPLIES) IMPLANT
CLIP LIGATING EXTRA MED SLVR (CLIP) ×3 IMPLANT
CLIP LIGATING EXTRA SM BLUE (MISCELLANEOUS) ×3 IMPLANT
COVER PROBE W GEL 5X96 (DRAPES) IMPLANT
COVER SURGICAL LIGHT HANDLE (MISCELLANEOUS) ×3 IMPLANT
DECANTER SPIKE VIAL GLASS SM (MISCELLANEOUS) ×3 IMPLANT
DRAPE C-ARM 42X72 X-RAY (DRAPES) IMPLANT
DRAPE CHEST BREAST 15X10 FENES (DRAPES) IMPLANT
ELECT REM PT RETURN 9FT ADLT (ELECTROSURGICAL) ×3
ELECTRODE REM PT RTRN 9FT ADLT (ELECTROSURGICAL) ×2 IMPLANT
GAUZE SPONGE 2X2 8PLY STRL LF (GAUZE/BANDAGES/DRESSINGS) ×2 IMPLANT
GAUZE SPONGE 4X4 16PLY XRAY LF (GAUZE/BANDAGES/DRESSINGS) IMPLANT
GEL ULTRASOUND 20GR AQUASONIC (MISCELLANEOUS) IMPLANT
GLOVE SS BIOGEL STRL SZ 7.5 (GLOVE) ×2 IMPLANT
GLOVE SUPERSENSE BIOGEL SZ 7.5 (GLOVE) ×1
GOWN STRL NON-REIN LRG LVL3 (GOWN DISPOSABLE) ×9 IMPLANT
KIT BASIN OR (CUSTOM PROCEDURE TRAY) ×3 IMPLANT
KIT ROOM TURNOVER OR (KITS) ×3 IMPLANT
NEEDLE 18GX1X1/2 (RX/OR ONLY) (NEEDLE) IMPLANT
NEEDLE 22X1 1/2 (OR ONLY) (NEEDLE) ×3 IMPLANT
NEEDLE HYPO 25GX1X1/2 BEV (NEEDLE) ×3 IMPLANT
NS IRRIG 1000ML POUR BTL (IV SOLUTION) ×3 IMPLANT
PACK CV ACCESS (CUSTOM PROCEDURE TRAY) ×3 IMPLANT
PACK SURGICAL SETUP 50X90 (CUSTOM PROCEDURE TRAY) IMPLANT
PAD ARMBOARD 7.5X6 YLW CONV (MISCELLANEOUS) ×6 IMPLANT
SOAP 2 % CHG 4 OZ (WOUND CARE) IMPLANT
SPONGE GAUZE 2X2 STER 10/PKG (GAUZE/BANDAGES/DRESSINGS) ×1
SPONGE GAUZE 4X4 12PLY (GAUZE/BANDAGES/DRESSINGS) IMPLANT
STRIP CLOSURE SKIN 1/2X4 (GAUZE/BANDAGES/DRESSINGS) ×3 IMPLANT
SUT ETHILON 3 0 PS 1 (SUTURE) IMPLANT
SUT PROLENE 6 0 CC (SUTURE) ×6 IMPLANT
SUT SILK 2 0 FS (SUTURE) IMPLANT
SUT VIC AB 3-0 SH 27 (SUTURE) ×1
SUT VIC AB 3-0 SH 27X BRD (SUTURE) ×2 IMPLANT
SUT VICRYL 4-0 PS2 18IN ABS (SUTURE) IMPLANT
SYR 20CC LL (SYRINGE) IMPLANT
SYR 30ML LL (SYRINGE) IMPLANT
SYR 5ML LL (SYRINGE) ×3 IMPLANT
SYR CONTROL 10ML LL (SYRINGE) ×3 IMPLANT
SYRINGE 10CC LL (SYRINGE) ×3 IMPLANT
TOWEL OR 17X24 6PK STRL BLUE (TOWEL DISPOSABLE) ×3 IMPLANT
TOWEL OR 17X26 10 PK STRL BLUE (TOWEL DISPOSABLE) ×3 IMPLANT
UNDERPAD 30X30 INCONTINENT (UNDERPADS AND DIAPERS) ×3 IMPLANT
WATER STERILE IRR 1000ML POUR (IV SOLUTION) IMPLANT

## 2013-02-11 NOTE — Anesthesia Postprocedure Evaluation (Signed)
  Anesthesia Post-op Note  Patient: Desiree [name]  Procedure(s) Performed: Procedure(s): REVISION OF ARTERIOVENOUS GORTEX FISTULA (Left)  Patient Location: PACU  Anesthesia Type:General  Level of Consciousness: awake  Airway and Oxygen Therapy: Patient Spontanous Breathing  Post-op Pain: mild  Post-op Assessment: Post-op Vital signs reviewed, Patient's Cardiovascular Status Stable, Respiratory Function Stable, Patent Airway, No signs of Nausea or vomiting and Pain level controlled  Post-op Vital Signs: Reviewed and stable  Complications: No apparent anesthesia complications

## 2013-02-11 NOTE — Anesthesia Preprocedure Evaluation (Addendum)
Anesthesia Evaluation  Patient identified by MRN, date of birth, ID band Patient awake    Reviewed: Allergy & Precautions, H&P , NPO status , Patient's Chart, lab work & pertinent test results  Airway Mallampati: II TM Distance: >3 FB Neck ROM: full    Dental no notable dental hx. (+) Teeth Intact and Dental Advisory Given   Pulmonary  breath sounds clear to auscultation  Pulmonary exam normal       Cardiovascular Exercise Tolerance: Good hypertension, Rhythm:regular Rate:Normal     Neuro/Psych  Headaches, Depression    GI/Hepatic GERD-  Medicated and Poorly Controlled,  Endo/Other    Renal/GU ESRFRenal disease     Musculoskeletal   Abdominal   Peds  Hematology  (+) anemia ,   Anesthesia Other Findings Dialysis tomorrow  Reproductive/Obstetrics                          Anesthesia Physical Anesthesia Plan  ASA: III  Anesthesia Plan: General   Post-op Pain Management:    Induction: Intravenous  Airway Management Planned: LMA  Additional Equipment:   Intra-op Plan:   Post-operative Plan: Extubation in OR  Informed Consent: I have reviewed the patients History and Physical, chart, labs and discussed the procedure including the risks, benefits and alternatives for the proposed anesthesia with the patient or authorized representative who has indicated his/her understanding and acceptance.     Plan Discussed with: CRNA and Surgeon  Anesthesia Plan Comments:         Anesthesia Quick Evaluation

## 2013-02-11 NOTE — H&P (View-Only) (Signed)
Patient ID: Desiree [name], female   DOB: Aug 12, 1975, 37 y.o.   MRN: [id number] The patient presents today to the emergency department after a call from dialysis Center stating that she had a raised area on her left AV fistula. The patient has had this fistula since 2010. She was using a buttonhole technique in the more distal area towards the wrist had been inflamed and she had not been using this area for access several weeks. There had been some oozing from this and some blistering over this. This was reported to [place] at the dialysis center today and I asked that she be transferred to [facility] in the emergency department for further evaluation and possible surgical treatment. The patient was told to be n.p.o. Unfortunately despite this she ate a sandwich and bag of potato chips on her way from the dialysis center to the hospital.  Past Medical History  Diagnosis Date  . Blood transfusion   . Preterm labor   . Dialysis patient   . Chronic kidney disease   . Hypertension     History  Substance Use Topics  . Smoking status: Never Smoker   . Smokeless tobacco: Never Used  . Alcohol Use: Yes     Comment: occ    Family History  Problem Relation Age of Onset  . Hypertension    . Diabetes type II      Allergies  Allergen Reactions  . Morphine And Related Anaphylaxis    Patient says she stops breathing Can take percocet  . Prednisone Other (See Comments)    Patient says prednisone causes her to cramp all over, muscle spasms uncontrolled    No current facility-administered medications for this encounter. Current outpatient prescriptions:B Complex-C-Folic Acid (NEPHRO-VITE PO), Take 1 tablet by mouth daily. , Disp: , Rfl: ;  calcium acetate (PHOSLO) 667 MG capsule, Take 2,668 mg by mouth 3 (three) times daily with meals. , Disp: , Rfl: ;  cinacalcet (SENSIPAR) 60 MG tablet, Take 60 mg by mouth daily., Disp: , Rfl: ;  lanthanum (FOSRENOL) 1000 MG chewable tablet, Chew 500 mg by  mouth 2 (two) times daily with a meal., Disp: , Rfl:   BP 152/86  Pulse 86  Temp(Src) 98.5 F (36.9 C) (Oral)  Resp 22  Ht 5\' 5"  (1.651 m)  Wt 179 lb (81.194 kg)  BMI 29.79 kg/m2  SpO2 100%  LMP 01/15/2013  Body mass index is 29.79 kg/(m^2).    Physical exam: Well-developed well-nourished female in no acute distress Respirations are nonlabored Her left arm does reveal Cimino radiocephalic AV fistula. There is an area approximately 2 cm proximal to the AV anastomosis where she has approximately 1 cm area of swelling and superficial skin breakdown. There is tenderness over this as well. The upper area of buttonhole and the rest of her fistula and has no evidence of breakdown.  Impression and plan aeration of AV fistula at the area approximately 2-3 cm above her AV anastomosis. I explained to the patient that this will have to have excision. I explained there is a chance that this will have to be ligation of her fistula and new access placement. Hopefully this can be avoided hopefully we can preserve the fistula. She does not have any evidence of current bleeding this been present for some time. I have scheduled for surgery for 7:30 in the morning. Unfortunately she did eat. It is approximately 3 PM now. We will of wrap her wrist with an Ace wrap and  she was instructed on treatment should she have any bleeding from this. She was given a prescription for Percocet #20 for pain she understands to be n.p.o. after midnight

## 2013-02-11 NOTE — Interval H&P Note (Signed)
History and Physical Interval Note:  02/11/2013 7:36 AM  Desiree [name]  has presented today for surgery, with the diagnosis of /  The various methods of treatment have been discussed with the patient and family. After consideration of risks, benefits and other options for treatment, the patient has consented to  Procedure(s): REVISION POSSIBLE LIGATION ARTERIOVENOUS GORTEX GRAFT (Left) INSERTION OF DIALYSIS CATHETER (Left) as a surgical intervention .  The patient's history has been reviewed, patient examined, no change in status, stable for surgery.  I have reviewed the patient's chart and labs.  Questions were answered to the patient's satisfaction.     Joi Leyva

## 2013-02-11 NOTE — Preoperative (Signed)
Beta Blockers   Reason not to administer Beta Blockers:not on beta blocker

## 2013-02-11 NOTE — Op Note (Signed)
    OPERATIVE REPORT  DATE OF SURGERY: 02/11/2013  PATIENT: Desiree [name], 37 y.o. 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  PHYSICIAN ASSISTANT: Nurse  ANESTHESIA:  Gen.  EBL: Minimal ml  Total I/O In: 250 [I.V.:250] Out: 10 [Blood:10]  BLOOD ADMINISTERED: None  DRAINS: None  SPECIMEN: None  COUNTS CORRECT:  YES  PLAN OF CARE: PACU   PATIENT DISPOSITION:  PACU - hemodynamically stable  PROCEDURE DETAILS: The patient presented yesterday evening with an area of erosion of the false aneurysm in the left Cimino AV fistula. This was at a prior buttonhole site. There didn't increased erythema and approximately half centimeter erosion of the skin. Unfortunately she had had a full meal before she presented. This was wrapped with a pressure dressing and she was brought back to the hospital this morning for repair.  The patient was taken to the upper and placed supine position where general anesthesia was administered. The left arm was prepped and draped in usual sterile fashion. The arm was exsanguinated with a sterile 6 inch Esmarch and a pneumatic tourniquet was inflated in the upper arm. The area of concern was excised with an ellipse of skin back to healthy skin. There was a plug of thrombus at the erosion site of the skin down to the vein. The anterior wall of the vein actually was viable. The anterior wall of the vein was exposed by dissecting it from underneath the subcutaneous tissue. It was abraded back to healthy tissue. The vein was of large size at this area it was felt that primary closure the vein would not cause narrowing. The hole in the vein was approximately 3  mm. This was closed with interrupted 6-0 Prolene sutures. The tourniquet was deflated and excellent thrill was noted through the fistula. The wound was irrigated with saline and hemostasis obtained with electrocautery. The wound is closed with 3-0 Vicryl in the subcutaneous and subcuticular tissue. Benzoin Steri-Strips were applied.   Curt Jews, M.D. 02/11/2013 8:43 AM

## 2013-02-11 NOTE — Transfer of Care (Signed)
Immediate Anesthesia Transfer of Care Note  Patient: Desiree [name]  Procedure(s) Performed: Procedure(s): REVISION OF ARTERIOVENOUS GORTEX FISTULA (Left)  Patient Location: PACU  Anesthesia Type:General  Level of Consciousness: awake  Airway & Oxygen Therapy: Patient Spontanous Breathing  Post-op Assessment: Report given to PACU RN and Post -op Vital signs reviewed and stable  Post vital signs: Reviewed and stable  Complications: No apparent anesthesia complications

## 2013-02-11 NOTE — Anesthesia Procedure Notes (Signed)
Procedure Name: LMA Insertion Date/Time: 02/11/2013 7:59 AM Performed by: Marinda Elk A Pre-anesthesia Checklist: Patient identified, Timeout performed, Emergency Drugs available, Suction available and Patient being monitored Patient Re-evaluated:Patient Re-evaluated prior to inductionOxygen Delivery Method: Circle system utilized Preoxygenation: Pre-oxygenation with 100% oxygen Intubation Type: IV induction Ventilation: Mask ventilation without difficulty LMA Size: 4.0 Number of attempts: 1 Placement Confirmation: positive ETCO2 and breath sounds checked- equal and bilateral Tube secured with: Tape Dental Injury: Teeth and Oropharynx as per pre-operative assessment

## 2013-02-14 ENCOUNTER — Encounter (HOSPITAL_COMMUNITY): Payer: Self-pay | Admitting: Vascular Surgery

## 2013-02-17 ENCOUNTER — Encounter: Payer: Self-pay | Admitting: Vascular Surgery

## 2013-02-20 ENCOUNTER — Encounter: Payer: Self-pay | Admitting: Vascular Surgery

## 2013-02-21 ENCOUNTER — Ambulatory Visit (INDEPENDENT_AMBULATORY_CARE_PROVIDER_SITE_OTHER): Payer: Medicare Other | Admitting: Vascular Surgery

## 2013-02-21 ENCOUNTER — Ambulatory Visit (HOSPITAL_COMMUNITY)
Admission: RE | Admit: 2013-02-21 | Discharge: 2013-02-21 | Disposition: A | Discharge status: Home / Self Care | Payer: Medicare Other | Source: Ambulatory Visit | Attending: Vascular Surgery | Admitting: Vascular Surgery

## 2013-02-21 ENCOUNTER — Encounter: Payer: Self-pay | Admitting: Vascular Surgery

## 2013-02-21 VITALS — BP 148/98 | HR 93 | Ht 65.0 in | Wt 170.4 lb

## 2013-02-21 DIAGNOSIS — N186 End stage renal disease: Secondary | ICD-10-CM

## 2013-02-21 DIAGNOSIS — T82598A Other mechanical complication of other cardiac and vascular devices and implants, initial encounter: Secondary | ICD-10-CM | POA: Insufficient documentation

## 2013-02-21 DIAGNOSIS — Y849 Medical procedure, unspecified as the cause of abnormal reaction of the patient, or of later complication, without mention of misadventure at the time of the procedure: Secondary | ICD-10-CM | POA: Insufficient documentation

## 2013-02-21 NOTE — Progress Notes (Signed)
Subjective:     Patient ID: Desiree [name], female   DOB: 1976-02-18, 37 y.o.   MRN: [id number]  HPI this 37 year old female seen in followup today. She had an urgent procedure performed by Dr. early on 02/11/2013 for bleeding from a left forearm AV fistula radial-cephalic where an aneurysm had aerated into the skin. She had excision of the eroded nonviable skin and resection of the anterior wall of the fistula with primary closure. Patient has been dialyzed through the fistula since that time without difficulty.  Review of Systems     Objective:   Physical Exam BP 148/98  Pulse 93  Ht 5\' 5"  (1.651 m)  Wt 170 lb 6.4 oz (77.293 kg)  BMI 28.36 kg/m2  SpO2 100%  LMP 02/08/2013  General well-developed well-nourished female in no apparent stress alert and oriented x3 Left upper extremities radial-cephalic fistula with excellent pulse and palpable thrill. Steri-Strips in place over the suture line in skin and distal forearm with no evidence of infection or hematoma      Assessment:     Nicely functioning left radial-cephalic AV fistula after revision and excision of eroded segment and in distal forearm performed Dr. early on 02/11/2013 Fistula functioning well    Plan:     Return to see [place] on when necessary basis

## 2013-02-22 ENCOUNTER — Emergency Department (HOSPITAL_COMMUNITY): Payer: Medicare Other

## 2013-02-22 ENCOUNTER — Emergency Department (HOSPITAL_COMMUNITY)
Admission: EM | Admit: 2013-02-22 | Discharge: 2013-02-22 | Disposition: A | Discharge status: Home / Self Care | Payer: Medicare Other | Attending: Emergency Medicine | Admitting: Emergency Medicine

## 2013-02-22 DIAGNOSIS — S63501A Unspecified sprain of right wrist, initial encounter: Secondary | ICD-10-CM

## 2013-02-22 DIAGNOSIS — N186 End stage renal disease: Secondary | ICD-10-CM | POA: Insufficient documentation

## 2013-02-22 DIAGNOSIS — I120 Hypertensive chronic kidney disease with stage 5 chronic kidney disease or end stage renal disease: Secondary | ICD-10-CM | POA: Insufficient documentation

## 2013-02-22 DIAGNOSIS — K219 Gastro-esophageal reflux disease without esophagitis: Secondary | ICD-10-CM | POA: Insufficient documentation

## 2013-02-22 DIAGNOSIS — S63509A Unspecified sprain of unspecified wrist, initial encounter: Secondary | ICD-10-CM | POA: Insufficient documentation

## 2013-02-22 DIAGNOSIS — Y9289 Other specified places as the place of occurrence of the external cause: Secondary | ICD-10-CM | POA: Insufficient documentation

## 2013-02-22 DIAGNOSIS — Z8751 Personal history of pre-term labor: Secondary | ICD-10-CM | POA: Insufficient documentation

## 2013-02-22 DIAGNOSIS — J45909 Unspecified asthma, uncomplicated: Secondary | ICD-10-CM | POA: Insufficient documentation

## 2013-02-22 DIAGNOSIS — Y9389 Activity, other specified: Secondary | ICD-10-CM | POA: Insufficient documentation

## 2013-02-22 DIAGNOSIS — Z79899 Other long term (current) drug therapy: Secondary | ICD-10-CM | POA: Insufficient documentation

## 2013-02-22 DIAGNOSIS — W010XXA Fall on same level from slipping, tripping and stumbling without subsequent striking against object, initial encounter: Secondary | ICD-10-CM | POA: Insufficient documentation

## 2013-02-22 DIAGNOSIS — Z992 Dependence on renal dialysis: Secondary | ICD-10-CM | POA: Insufficient documentation

## 2013-02-22 MED ORDER — OXYCODONE-ACETAMINOPHEN 5-325 MG PO TABS: 1.0000 | ORAL_TABLET | Freq: Four times a day (QID) | ORAL | Status: DC | PRN

## 2013-02-22 NOTE — ED Provider Notes (Signed)
CSN: [id number]     Arrival date & time 02/22/13  1644 History  This chart was scribed for non-physician practitioner, Montine Circle, PA-C,working with Virgel Manifold, MD, by Marlowe Kays, ED Scribe.  This patient was seen in room WTR7/WTR7 and the patient's care was started at 5:53 PM.  Chief Complaint  Patient presents with  . Wrist Injury   The history is provided by the patient. No language interpreter was used.   HPI Comments:  Desiree [name] is a 37 y.o. female who presents to the Emergency Department complaining of a right wrist injury after slipping and falling onto it five days ago. Pt denies taking anything for pain PTA. Pt denies any surgeries to the right arm.   Past Medical History  Diagnosis Date  . Blood transfusion   . Preterm labor   . Dialysis patient   . Chronic kidney disease   . Hypertension   . Asthma     as a child  . Headache(784.0)   . GERD (gastroesophageal reflux disease)     nexium prn   Past Surgical History  Procedure Laterality Date  . Renal biopsy    . Dilation and curettage of uterus    . Dilation and evacuation  08/02/2011    Procedure: DILATATION AND EVACUATION;  Surgeon: Logan Bores, MD;  Location: [name];  Service: Gynecology;;  . Av fistula placement Left 2010  . Revision of arteriovenous goretex graft Left 02/11/2013    Procedure: REVISION OF ARTERIOVENOUS GORTEX FISTULA;  Surgeon: Rosetta Posner, MD;  Location: [facility] OR;  Service: Vascular;  Laterality: Left;   Family History  Problem Relation Age of Onset  . Hypertension    . Diabetes type II     History  Substance Use Topics  . Smoking status: Never Smoker   . Smokeless tobacco: Never Used  . Alcohol Use: Yes     Comment: occ   OB History   Grav Para Term Preterm Abortions TAB SAB Ect Mult Living   5 2 1 1 1 1  0 0 0 1     Review of Systems A complete 10 system review of systems was obtained and all systems are negative except as noted in the HPI and PMH.    Allergies  Morphine and related and Prednisone  Home Medications   Current Outpatient Rx  Name  Route  Sig  Dispense  Refill  . B Complex-C-Folic Acid (NEPHRO-VITE PO)   Oral   Take 1 tablet by mouth daily.          . calcium acetate (PHOSLO) 667 MG capsule   Oral   Take 2,668 mg by mouth 3 (three) times daily with meals.          . cinacalcet (SENSIPAR) 60 MG tablet   Oral   Take 60 mg by mouth daily.         Marland Kitchen esomeprazole (NEXIUM) 40 MG capsule   Oral   Take 40 mg by mouth daily as needed (heart burn).          Marland Kitchen lanthanum (FOSRENOL) 1000 MG chewable tablet   Oral   Chew 1,000 mg by mouth 3 (three) times daily with meals.           Triage Vitals: BP 123/81  Pulse 109  Temp(Src) 98 F (36.7 C) (Oral)  Resp 17  SpO2 100%  LMP 02/08/2013 Physical Exam  Nursing note and vitals reviewed. Constitutional: She is oriented to person, place, and  time. She appears well-developed and well-nourished.  HENT:  Head: Normocephalic and atraumatic.  Neck: Neck supple.  Cardiovascular: Intact distal pulses.   Brisk capillary refill  Pulmonary/Chest: Effort normal.  Musculoskeletal: Normal range of motion.  Right wrist moderately tenderness to palpation. Normal ROM and strength. No palpable abnormality or deformity.  Neurological: She is alert and oriented to person, place, and time. No cranial nerve deficit.  Sensation intact.   Skin:  Well healing wounds to left arm from recent surgery, no discharge, or erythema noted  Psychiatric: She has a normal mood and affect. Her behavior is normal.    ED Course  Procedures (including critical care time) DIAGNOSTIC STUDIES: Oxygen Saturation is 100% on RA, normal by my interpretation.   COORDINATION OF CARE: 5:56 PM- Informed pt her X-Rays were negative. Will provide a wrist splint and a prescription for pain medication. Advised if pain persist to follow up with a hand specialist. Pt verbalizes understanding and agrees  to plan.  Medications - No data to display  Labs Review Labs Reviewed - No data to display Imaging Review Dg Wrist Complete Right  02/22/2013   CLINICAL DATA:  [place].  Right wrist pain.  EXAM: RIGHT WRIST - COMPLETE 3+ VIEW  COMPARISON:  None.  FINDINGS: The joint spaces are maintained. No acute bony findings. No degenerative changes.  IMPRESSION: No acute fracture.   Electronically Signed   By: Kalman Jewels M.D.   On: 02/22/2013 17:40   Dg Hand Complete Right  02/22/2013   CLINICAL DATA:  Pain post trauma  EXAM: RIGHT HAND - COMPLETE 3+ VIEW  COMPARISON:  None.  FINDINGS: Frontal, lateral, and oblique views were obtained. No fracture or dislocation. Joint spaces appear intact. No erosive change.  IMPRESSION: No abnormality noted.   Electronically Signed   By: Lowella Grip M.D.   On: 02/22/2013 17:38    EKG Interpretation   None       MDM   1. Wrist sprain, right, initial encounter    Patient with wrist sprain following a fall on outstretched hand. Plain films are negative. Give a couple pain pills, and recommend him followup. Patient understands and agrees with the plan.  I personally performed the services described in this documentation, which was scribed in my presence. The recorded information has been reviewed and is accurate.     Montine Circle, PA-C 02/22/13 [phone]

## 2013-02-22 NOTE — ED Notes (Signed)
Pt states she fell last Friday and has pain to her R wrist and R hand. No swelling or deformity noted. ROM intact.

## 2013-02-22 NOTE — Discharge Summary (Signed)
Patient ID: Desiree [name] MRN: [id number] DOB/AGE: [birth year] 37 y.o.  Admit date: 02/11/2013  Discharge date: 02/22/2013  Attending Surgeon: Leveta Wahab, MD  Admission Diagnosis: /left av fistula bleed  Discharge Diagnoses:  /same  Secondary Diagnoses: Present on Admission:  **None**  Procedures: repair left av fistula, DOS: 02/11/2013  Discharged Condition: good  Hospital Course:  Desiree [name] is a 37 y.o. female is S/P outpatient surgery;   Extubated: POD # 0 Post-op wounds clean, dry, intact Pt. Ambulating, voiding and taking PO diet without difficulty. Pt pain controlled with PO pain meds. Labs as below Complications:none  Consults:     Recent vital signs:  Filed Vitals:   02/11/13 0945  BP:   Pulse: 85  Temp: 98.2 F (36.8 C)  Resp: 16    Recent laboratory studies:  Results for orders placed during the hospital encounter of 02/11/13  HCG, SERUM, QUALITATIVE      Result Value Range   Preg, Serum NEGATIVE  NEGATIVE  POCT I-STAT 4, (NA,K, GLUC, HGB,HCT)      Result Value Range   Sodium 138  135 - 145 mEq/L   Potassium 4.3  3.5 - 5.1 mEq/L   Glucose, Bld 90  70 - 99 mg/dL   HCT 31.0 (*) 36.0 - 46.0 %   Hemoglobin 10.5 (*) 12.0 - 15.0 g/dL    Discharge Medications:     Medication List         calcium acetate 667 MG capsule  Commonly known as:  PHOSLO  Take 2,668 mg by mouth 3 (three) times daily with meals.     cinacalcet 60 MG tablet  Commonly known as:  SENSIPAR  Take 60 mg by mouth daily.     esomeprazole 40 MG capsule  Commonly known as:  NEXIUM  Take 40 mg by mouth daily as needed.     FOSRENOL 1000 MG chewable tablet  Generic drug:  lanthanum  Chew 500 mg by mouth 2 (two) times daily with a meal.     NEPHRO-VITE PO  Take 1 tablet by mouth daily.     oxyCODONE-acetaminophen 5-325 MG per tablet  Commonly known as:  PERCOCET/ROXICET  Take 1 tablet by mouth every 4 (four) hours as needed.     oxyCODONE-acetaminophen 5-325 MG per tablet  Commonly known as:  ROXICET  Take 1-2 tablets by mouth every 4 (four) hours as needed for severe pain.        Diagnostic Studies: Dg Thoracic Spine 2 View  01/25/2013   CLINICAL DATA:  Mid back pain without injury  EXAM: THORACIC SPINE - 2 VIEW  COMPARISON:  None.  FINDINGS: There is no evidence of thoracic spine fracture. Alignment is normal. No other significant bone abnormalities are identified.  IMPRESSION: No acute abnormality noted.   Electronically Signed   By: Inez Catalina M.D.   On: 01/25/2013 15:23   Dg Lumbar Spine Complete  01/25/2013   CLINICAL DATA:  Low back pain  EXAM: LUMBAR SPINE - COMPLETE 4+ VIEW  COMPARISON:  08/07/2012  FINDINGS: Five lumbar type vertebral bodies are well visualized. Vertebral body height is well maintained. No spondylolysis or spondylolisthesis is noted.  IMPRESSION: No acute abnormality noted.   Electronically Signed   By: Inez Catalina M.D.   On: 01/25/2013 15:24    Disposition: 01-Home or Self Care      Discharge Orders   Future Orders Complete By Expires   Call MD for:  redness, tenderness, or  signs of infection (pain, swelling, bleeding, redness, odor or green/yellow discharge around incision site)  As directed    Call MD for:  severe or increased pain, loss or decreased feeling  in affected limb(s)  As directed    Call MD for:  temperature >100.5  As directed    Remove dressing in 48 hours  As directed    Resume previous diet  As directed         Verbal and written Discharge instructions given to the patient. Wound care per Discharge AVS  Signed: Camella Seim 02/22/2013, 2:37 PM

## 2013-02-25 NOTE — ED Provider Notes (Signed)
Medical screening examination/treatment/procedure(s) were performed by non-physician practitioner and as supervising physician I was immediately available for consultation/collaboration.  EKG Interpretation   None        Virgel Manifold, MD 02/25/13 [phone]

## 2013-04-27 ENCOUNTER — Ambulatory Visit: Payer: Self-pay | Admitting: Podiatry

## 2013-07-05 ENCOUNTER — Emergency Department (HOSPITAL_COMMUNITY): Payer: Medicare Other

## 2013-07-05 ENCOUNTER — Emergency Department (HOSPITAL_COMMUNITY)
Admission: EM | Admit: 2013-07-05 | Discharge: 2013-07-05 | Disposition: A | Discharge status: Home / Self Care | Payer: Medicare Other | Attending: Emergency Medicine | Admitting: Emergency Medicine

## 2013-07-05 ENCOUNTER — Encounter (HOSPITAL_COMMUNITY): Payer: Self-pay | Admitting: Emergency Medicine

## 2013-07-05 DIAGNOSIS — S8990XA Unspecified injury of unspecified lower leg, initial encounter: Secondary | ICD-10-CM | POA: Insufficient documentation

## 2013-07-05 DIAGNOSIS — S99919A Unspecified injury of unspecified ankle, initial encounter: Principal | ICD-10-CM

## 2013-07-05 DIAGNOSIS — Y939 Activity, unspecified: Secondary | ICD-10-CM | POA: Insufficient documentation

## 2013-07-05 DIAGNOSIS — R509 Fever, unspecified: Secondary | ICD-10-CM | POA: Insufficient documentation

## 2013-07-05 DIAGNOSIS — Z992 Dependence on renal dialysis: Secondary | ICD-10-CM | POA: Insufficient documentation

## 2013-07-05 DIAGNOSIS — I120 Hypertensive chronic kidney disease with stage 5 chronic kidney disease or end stage renal disease: Secondary | ICD-10-CM | POA: Insufficient documentation

## 2013-07-05 DIAGNOSIS — J45909 Unspecified asthma, uncomplicated: Secondary | ICD-10-CM | POA: Insufficient documentation

## 2013-07-05 DIAGNOSIS — Z8751 Personal history of pre-term labor: Secondary | ICD-10-CM | POA: Insufficient documentation

## 2013-07-05 DIAGNOSIS — Z79899 Other long term (current) drug therapy: Secondary | ICD-10-CM | POA: Insufficient documentation

## 2013-07-05 DIAGNOSIS — Y929 Unspecified place or not applicable: Secondary | ICD-10-CM | POA: Insufficient documentation

## 2013-07-05 DIAGNOSIS — N186 End stage renal disease: Secondary | ICD-10-CM | POA: Insufficient documentation

## 2013-07-05 DIAGNOSIS — M79676 Pain in unspecified toe(s): Secondary | ICD-10-CM

## 2013-07-05 DIAGNOSIS — S99929A Unspecified injury of unspecified foot, initial encounter: Principal | ICD-10-CM

## 2013-07-05 DIAGNOSIS — X58XXXA Exposure to other specified factors, initial encounter: Secondary | ICD-10-CM | POA: Insufficient documentation

## 2013-07-05 DIAGNOSIS — K219 Gastro-esophageal reflux disease without esophagitis: Secondary | ICD-10-CM | POA: Insufficient documentation

## 2013-07-05 MED ORDER — HYDROCODONE-ACETAMINOPHEN 5-325 MG PO TABS
1.0000 | ORAL_TABLET | Freq: Once | ORAL | Status: AC
  Administered 2013-07-05: 1 via ORAL
  Filled 2013-07-05: qty 1

## 2013-07-05 MED ORDER — HYDROCODONE-ACETAMINOPHEN 5-325 MG PO TABS: 1.0000 | ORAL_TABLET | Freq: Four times a day (QID) | ORAL | Status: DC | PRN

## 2013-07-05 MED ORDER — HYDROCODONE-ACETAMINOPHEN 5-325 MG PO TABS
1.0000 | ORAL_TABLET | Freq: Once | ORAL | Status: DC
  Filled 2013-07-05: qty 1

## 2013-07-05 NOTE — ED Notes (Signed)
Pt sts that she came straight from dialysis.

## 2013-07-05 NOTE — ED Notes (Signed)
Pt c/o L big toe pain since Monday. Pt sts she woke up on Monday and it was hurting her. Pt unable to wear shoe because of pain. Pt denies recalling any injury to toe. Toe does not appear swollen at this time but pt sts "it just went down." A&Ox4.

## 2013-07-05 NOTE — ED Provider Notes (Signed)
Medical screening examination/treatment/procedure(s) were performed by non-physician practitioner and as supervising physician I was immediately available for consultation/collaboration.   EKG Interpretation None       Virgel Manifold, MD 07/05/13 2325

## 2013-07-05 NOTE — ED Provider Notes (Signed)
CSN: [id number]     Arrival date & time 07/05/13  1707 History  This chart was scribed for non-physician practitioner, Glendell Docker, NP, working with Virgel Manifold, MD by Ladene Artist, ED Scribe. This patient was seen in room WTR6/WTR6 and the patient's care was started at 5:22 PM.    Chief Complaint  Patient presents with  . Toe Injury   The history is provided by the patient. No language interpreter was used.   HPI Comments: Desiree [name] is a 38 y.o. female who presents to the Emergency Department complaining of L great toe pain onset 2 days ago. She does not recall injury. She also reports fever. ED temperature 99.4 F. Pt states that she had dialysis earlier today.   Past Medical History  Diagnosis Date  . Blood transfusion   . Preterm labor   . Dialysis patient   . Chronic kidney disease   . Hypertension   . Asthma     as a child  . Headache(784.0)   . GERD (gastroesophageal reflux disease)     nexium prn   Past Surgical History  Procedure Laterality Date  . Renal biopsy    . Dilation and curettage of uterus    . Dilation and evacuation  08/02/2011    Procedure: DILATATION AND EVACUATION;  Surgeon: Logan Bores, MD;  Location: [facility];  Service: Gynecology;;  . Av fistula placement Left 2010  . Revision of arteriovenous goretex graft Left 02/11/2013    Procedure: REVISION OF ARTERIOVENOUS GORTEX FISTULA;  Surgeon: Rosetta Posner, MD;  Location: [facility] OR;  Service: Vascular;  Laterality: Left;   Family History  Problem Relation Age of Onset  . Hypertension    . Diabetes type II     History  Substance Use Topics  . Smoking status: Never Smoker   . Smokeless tobacco: Never Used  . Alcohol Use: Yes     Comment: occ   OB History   Grav Para Term Preterm Abortions TAB SAB Ect Mult Living   5 2 1 1 1 1  0 0 0 1     Review of Systems  Constitutional: Positive for fever.  Musculoskeletal: Positive for myalgias.  All other systems reviewed and are  negative.  Allergies  Morphine and related and Prednisone  Home Medications   Prior to Admission medications   Medication Sig Start Date End Date Taking? Authorizing Provider  B Complex-C-Folic Acid (NEPHRO-VITE PO) Take 1 tablet by mouth daily.     Historical Provider, MD  calcium acetate (PHOSLO) 667 MG capsule Take 2,668 mg by mouth 3 (three) times daily with meals.     Historical Provider, MD  cinacalcet (SENSIPAR) 60 MG tablet Take 60 mg by mouth daily.    Historical Provider, MD  esomeprazole (NEXIUM) 40 MG capsule Take 40 mg by mouth daily as needed (heart burn).     Historical Provider, MD  lanthanum (FOSRENOL) 1000 MG chewable tablet Chew 1,000 mg by mouth 3 (three) times daily with meals.     Historical Provider, MD  oxyCODONE-acetaminophen (PERCOCET/ROXICET) 5-325 MG per tablet Take 1 tablet by mouth every 6 (six) hours as needed for severe pain. 02/22/13   Montine Circle, PA-C   Triage Vitals: BP 118/90  Pulse 119  Temp(Src) 99.4 F (37.4 C) (Oral)  Resp 18  SpO2 100%  LMP 06/10/2013 Physical Exam  Nursing note and vitals reviewed. Constitutional: She is oriented to person, place, and time. She appears well-developed and well-nourished.  HENT:  Head: Normocephalic and atraumatic.  Eyes: EOM are normal.  Neck: Neck supple.  Cardiovascular: Normal rate.   Pulmonary/Chest: Effort normal.  Musculoskeletal: Normal range of motion.       Right shoulder: She exhibits tenderness. She exhibits no swelling.  No redness   Neurological: She is alert and oriented to person, place, and time.  Skin: Skin is warm and dry.  Psychiatric: She has a normal mood and affect. Her behavior is normal.    ED Course  Procedures  DIAGNOSTIC STUDIES: Oxygen Saturation is 100% on RA, normal by my interpretation.    COORDINATION OF CARE: 5:24 PM-Discussed treatment plan which includes X-Ray with pt at bedside and pt agreed to plan.   6:16 PM-Informed pt of radiology findings. Will  discharge pt with instructions and pain medcation. Pt agreed to plan.  Labs Review Labs Reviewed - No data to display  Imaging Review Dg Toe Great Left  07/05/2013   CLINICAL DATA:  Pain post blunt trauma  EXAM: LEFT GREAT TOE  COMPARISON:  None.  FINDINGS: There is no evidence of fracture or dislocation. There is no evidence of arthropathy or other focal bone abnormality. Soft tissues are unremarkable. 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  IMPRESSION: Negative.   Electronically Signed   By: Arne Cleveland M.D.   On: 07/05/2013 17:57     EKG Interpretation None      MDM   Final diagnoses:  Toe pain   No bony abnormality noted. No swelling or redness concerning for infection or gout.will treat symptomatically  I personally performed the services described in this documentation, which was scribed in my presence. The recorded information has been reviewed and is accurate.    Glendell Docker, NP 07/05/13 1820

## 2013-07-05 NOTE — Discharge Instructions (Signed)
Musculoskeletal Pain °Musculoskeletal pain is muscle and boney aches and pains. These pains can occur in any part of the body. Your caregiver may treat you without knowing the cause of the pain. They may treat you if blood or urine tests, X-rays, and other tests were normal.  °CAUSES °There is often not a definite cause or reason for these pains. These pains may be caused by a type of germ (virus). The discomfort may also come from overuse. Overuse includes working out too hard when your body is not fit. Boney aches also come from weather changes. Bone is sensitive to atmospheric pressure changes. °HOME CARE INSTRUCTIONS  °· Ask when your test results will be ready. Make sure you get your test results. °· Only take over-the-counter or prescription medicines for pain, discomfort, or fever as directed by your caregiver. If you were given medications for your condition, do not drive, operate machinery or power tools, or sign legal documents for 24 hours. Do not drink alcohol. Do not take sleeping pills or other medications that may interfere with treatment. °· Continue all activities unless the activities cause more pain. When the pain lessens, slowly resume normal activities. Gradually increase the intensity and duration of the activities or exercise. °· During periods of severe pain, bed rest may be helpful. Lay or sit in any position that is comfortable. °· Putting ice on the injured area. °· Put ice in a bag. °· Place a towel between your skin and the bag. °· Leave the ice on for 15 to 20 minutes, 3 to 4 times a day. °· Follow up with your caregiver for continued problems and no reason can be found for the pain. If the pain becomes worse or does not go away, it may be necessary to repeat tests or do additional testing. Your caregiver may need to look further for a possible cause. °SEEK IMMEDIATE MEDICAL CARE IF: °· You have pain that is getting worse and is not relieved by medications. °· You develop chest pain  that is associated with shortness or breath, sweating, feeling sick to your stomach (nauseous), or throw up (vomit). °· Your pain becomes localized to the abdomen. °· You develop any new symptoms that seem different or that concern you. °MAKE SURE YOU:  °· Understand these instructions. °· Will watch your condition. °· Will get help right away if you are not doing well or get worse. °Document Released: 03/02/2005 Document Revised: 05/25/2011 Document Reviewed: 11/04/2012 °[employer]® Patient Information ©2014 [employer]. ° °

## 2013-07-11 ENCOUNTER — Encounter (HOSPITAL_COMMUNITY): Payer: Self-pay | Admitting: Emergency Medicine

## 2013-07-11 ENCOUNTER — Emergency Department (HOSPITAL_COMMUNITY)
Admission: EM | Admit: 2013-07-11 | Discharge: 2013-07-11 | Disposition: A | Discharge status: Home / Self Care | Payer: Medicare Other | Attending: Emergency Medicine | Admitting: Emergency Medicine

## 2013-07-11 ENCOUNTER — Emergency Department (HOSPITAL_COMMUNITY): Payer: Medicare Other

## 2013-07-11 DIAGNOSIS — Z79899 Other long term (current) drug therapy: Secondary | ICD-10-CM | POA: Insufficient documentation

## 2013-07-11 DIAGNOSIS — Z992 Dependence on renal dialysis: Secondary | ICD-10-CM | POA: Insufficient documentation

## 2013-07-11 DIAGNOSIS — K219 Gastro-esophageal reflux disease without esophagitis: Secondary | ICD-10-CM | POA: Insufficient documentation

## 2013-07-11 DIAGNOSIS — N186 End stage renal disease: Secondary | ICD-10-CM

## 2013-07-11 DIAGNOSIS — M129 Arthropathy, unspecified: Secondary | ICD-10-CM | POA: Insufficient documentation

## 2013-07-11 DIAGNOSIS — M1990 Unspecified osteoarthritis, unspecified site: Secondary | ICD-10-CM

## 2013-07-11 DIAGNOSIS — I120 Hypertensive chronic kidney disease with stage 5 chronic kidney disease or end stage renal disease: Secondary | ICD-10-CM | POA: Insufficient documentation

## 2013-07-11 DIAGNOSIS — J45909 Unspecified asthma, uncomplicated: Secondary | ICD-10-CM | POA: Insufficient documentation

## 2013-07-11 LAB — I-STAT CHEM 8, ED
BUN: 85 mg/dL — ABNORMAL HIGH (ref 6–23)
Calcium, Ion: 1 mmol/L — ABNORMAL LOW (ref 1.12–1.23)
Chloride: 102 mEq/L (ref 96–112)
Creatinine, Ser: 19.5 mg/dL — ABNORMAL HIGH (ref 0.50–1.10)
Glucose, Bld: 74 mg/dL (ref 70–99)
HCT: 32 % — ABNORMAL LOW (ref 36.0–46.0)
HEMOGLOBIN: 10.9 g/dL — AB (ref 12.0–15.0)
POTASSIUM: 5.2 meq/L (ref 3.7–5.3)
SODIUM: 138 meq/L (ref 137–147)
TCO2: 19 mmol/L (ref 0–100)

## 2013-07-11 MED ORDER — OXYCODONE-ACETAMINOPHEN 5-325 MG PO TABS: 1.0000 | ORAL_TABLET | Freq: Four times a day (QID) | ORAL | Status: DC | PRN

## 2013-07-11 MED ORDER — OXYCODONE-ACETAMINOPHEN 5-325 MG PO TABS
2.0000 | ORAL_TABLET | Freq: Once | ORAL | Status: AC
  Administered 2013-07-11: 2 via ORAL
  Filled 2013-07-11: qty 2

## 2013-07-11 NOTE — ED Notes (Signed)
Pt c/o right knee pain and left thumb pain that started yesterday.  Pt states she is unable to bear weight on right leg. And pt states she is unable to hold objects in left hand.  Pt is on dialysis. Pt goes to dialysis on MWF and wasn't able to go yesterday due to pain in leg.

## 2013-07-11 NOTE — Discharge Instructions (Signed)
Return to the ED with any concerns including fever, redness of joints, increased swelling of joints, decreased level of alertness/lethargy, or any other alarming symptoms

## 2013-07-11 NOTE — ED Provider Notes (Signed)
CSN: [id number]     Arrival date & time 07/11/13  1350 History   First MD Initiated Contact with Patient 07/11/13 1542     Chief Complaint  Patient presents with  . Knee Pain  . thumb pain      (Consider location/radiation/quality/duration/timing/severity/associated sxs/prior Treatment) HPI Pt presenting with c/o pain in right knee and left thumb.  She states that approx one week ago she was seen for left great toe pain.  She states that her dialysis doctor told her he thought symptoms were likely gout.  Over the past couple of days she then developed pain in left thumb and knee.  Pain is wors with movement and palpation.  No fever/chills.  She was not able to attend dialysis yesterday due to her pain.  No trauma.  There are no other associated systemic symptoms, there are no other alleviating or modifying factors.   Past Medical History  Diagnosis Date  . Blood transfusion   . Preterm labor   . Dialysis patient   . Chronic kidney disease   . Hypertension   . Asthma     as a child  . Headache(784.0)   . GERD (gastroesophageal reflux disease)     nexium prn   Past Surgical History  Procedure Laterality Date  . Renal biopsy    . Dilation and curettage of uterus    . Dilation and evacuation  08/02/2011    Procedure: DILATATION AND EVACUATION;  Surgeon: Logan Bores, MD;  Location: [facility];  Service: Gynecology;;  . Av fistula placement Left 2010  . Revision of arteriovenous goretex graft Left 02/11/2013    Procedure: REVISION OF ARTERIOVENOUS GORTEX FISTULA;  Surgeon: Rosetta Posner, MD;  Location: [facility];  Service: Vascular;  Laterality: Left;   Family History  Problem Relation Age of Onset  . Hypertension    . Diabetes type II     History  Substance Use Topics  . Smoking status: Never Smoker   . Smokeless tobacco: Never Used  . Alcohol Use: Yes     Comment: occ   OB History   Grav Para Term Preterm Abortions TAB SAB Ect Mult Living   5 2 1 1 1 1  0 0 0 1      Review of Systems ROS reviewed and all otherwise negative except for mentioned in HPI    Allergies  Morphine and related and Prednisone  Home Medications   Prior to Admission medications   Medication Sig Start Date End Date Taking? Authorizing Provider  albuterol (PROVENTIL HFA;VENTOLIN HFA) 108 (90 BASE) MCG/ACT inhaler Inhale 1-2 puffs into the lungs every 6 (six) hours as needed for wheezing or shortness of breath.   Yes Historical Provider, MD  calcium acetate (PHOSLO) 667 MG capsule Take 2,668 mg by mouth 3 (three) times daily with meals.    Yes Historical Provider, MD  cinacalcet (SENSIPAR) 60 MG tablet Take 60 mg by mouth daily.   Yes Historical Provider, MD  esomeprazole (NEXIUM) 40 MG capsule Take 40 mg by mouth daily as needed (heart burn).    Yes Historical Provider, MD  gabapentin (NEURONTIN) 100 MG capsule Take 200 mg by mouth at bedtime.   Yes Historical Provider, MD  HYDROcodone-acetaminophen (NORCO/VICODIN) 5-325 MG per tablet Take 1-2 tablets by mouth every 6 (six) hours as needed. 07/05/13  Yes Glendell Docker, NP  lanthanum (FOSRENOL) 1000 MG chewable tablet Chew 1,000 mg by mouth 2 (two) times daily with a meal.    Yes  Historical Provider, MD  zolpidem (AMBIEN) 5 MG tablet Take 5 mg by mouth at bedtime as needed for sleep.   Yes Historical Provider, MD   BP 139/92  Pulse 80  Temp(Src) 98.2 F (36.8 C) (Oral)  Resp 16  SpO2 100%  LMP 06/14/2013 Vitals reviewed Physical Exam Physical Examination: General appearance - alert, well appearing, and in no distress Mental status - alert, oriented to person, place, and time Eyes - no scleral icterus, no conjunctival injection Mouth - mucous membranes moist, pharynx normal without lesions Chest - clear to auscultation, no wheezes, rales or rhonchi, symmetric air entry Heart - normal rate, regular rhythm, normal S1, S2, no murmurs, rubs, clicks or gallops Abdomen - soft, nontender, nondistended, no masses or  organomegaly Neurological - alert, oriented x 3, strength 5/5 in extremities, sensation intact Musculoskeletal - no joint tenderness, deformity or swelling Extremities - peripheral pulses normal, no pedal edema, no clubbing or cyanosis Skin - normal coloration and turgor, no rashes  ED Course  Procedures (including critical care time) Labs Review Labs Reviewed  I-STAT CHEM 8, ED - Abnormal; Notable for the following:    BUN 85 (*)    Creatinine, Ser 19.50 (*)    Calcium, Ion 1.00 (*)    Hemoglobin 10.9 (*)    HCT 32.0 (*)    All other components within normal limits    Imaging Review Dg Wrist Complete Left  07/11/2013   CLINICAL DATA:  History of gout. Wrist pain. End-stage renal disease.  EXAM: LEFT WRIST - COMPLETE 3+ VIEW  COMPARISON:  04/02/2011  FINDINGS: There is a stable small ossification adjacent to the ulnar styloid. Alignment of the left wrist is normal. Negative for a fracture or dislocation. Surgical clip with soft tissue enlargement in the distal forearm. This is likely related to a surgical AV fistula. Small ossification along the distal aspect of the scaphoid is likely chronic. No evidence for bone destruction or erosions.  IMPRESSION: No acute bone abnormality to the left wrist.   Electronically Signed   By: Markus Daft M.D.   On: 07/11/2013 15:28   Dg Knee 2 Views Right  07/11/2013   CLINICAL DATA:  Right knee pain.  History of gout.  EXAM: RIGHT KNEE - 1-2 VIEW  COMPARISON:  None.  FINDINGS: There is no evidence of fracture, dislocation, or joint effusion. There is no evidence of arthropathy or other focal bone abnormality. Soft tissues are unremarkable.  IMPRESSION: Normal exam.   Electronically Signed   By: Rozetta Nunnery M.D.   On: 07/11/2013 15:26   Dg Finger Thumb Left  07/11/2013   CLINICAL DATA:  Left thumb pain, history of gout  EXAM: LEFT THUMB 2+V  COMPARISON:  None.  FINDINGS: No fracture or dislocation is seen.  The joint spaces are preserved.  No marginal  erosions.  The visualized soft tissues are unremarkable.  IMPRESSION: No acute osseus abnormality is seen.   Electronically Signed   By: Julian Hy M.D.   On: 07/11/2013 15:26     EKG Interpretation None      MDM   Final diagnoses:  Arthritis  End stage renal disease on dialysis    Pt presenting c/o pain in knee, wrist and great toe.  xrays are reassuring.  Symptoms most likely reflect gout- no signs of septic athritis- no warmth, overlying ertyeham, no fever.  Gout is also more common in dialysis patients.  Pt treated with pain control.  Due to her being on dialysis  colchicine is not recommended, prednisone is an alternative treatment however patient states allergy to prednisone.  Pt given rx for pain control, knee sleeve and wrist splint for comfort, advised to attend her dialysis tomorrow and f/u with her PMD.  Discharged with strict return precautions.  Pt agreeable with plan.    Threasa Beards, MD 07/11/13 2013

## 2013-07-29 ENCOUNTER — Encounter (HOSPITAL_COMMUNITY): Payer: Self-pay | Admitting: Emergency Medicine

## 2013-07-29 ENCOUNTER — Emergency Department (HOSPITAL_COMMUNITY)
Admission: EM | Admit: 2013-07-29 | Discharge: 2013-07-30 | Disposition: A | Discharge status: Home / Self Care | Payer: Medicare Other | Attending: Emergency Medicine | Admitting: Emergency Medicine

## 2013-07-29 DIAGNOSIS — G8929 Other chronic pain: Secondary | ICD-10-CM | POA: Insufficient documentation

## 2013-07-29 DIAGNOSIS — K219 Gastro-esophageal reflux disease without esophagitis: Secondary | ICD-10-CM | POA: Insufficient documentation

## 2013-07-29 DIAGNOSIS — Z79899 Other long term (current) drug therapy: Secondary | ICD-10-CM | POA: Insufficient documentation

## 2013-07-29 DIAGNOSIS — Z992 Dependence on renal dialysis: Secondary | ICD-10-CM | POA: Insufficient documentation

## 2013-07-29 DIAGNOSIS — M25569 Pain in unspecified knee: Secondary | ICD-10-CM | POA: Insufficient documentation

## 2013-07-29 DIAGNOSIS — J45909 Unspecified asthma, uncomplicated: Secondary | ICD-10-CM | POA: Insufficient documentation

## 2013-07-29 DIAGNOSIS — I120 Hypertensive chronic kidney disease with stage 5 chronic kidney disease or end stage renal disease: Secondary | ICD-10-CM | POA: Insufficient documentation

## 2013-07-29 DIAGNOSIS — M545 Low back pain, unspecified: Secondary | ICD-10-CM | POA: Insufficient documentation

## 2013-07-29 DIAGNOSIS — N186 End stage renal disease: Secondary | ICD-10-CM | POA: Insufficient documentation

## 2013-07-29 LAB — I-STAT CHEM 8, ED
BUN: 38 mg/dL — ABNORMAL HIGH (ref 6–23)
Calcium, Ion: 1.28 mmol/L — ABNORMAL HIGH (ref 1.12–1.23)
Chloride: 99 mEq/L (ref 96–112)
Creatinine, Ser: 12.3 mg/dL — ABNORMAL HIGH (ref 0.50–1.10)
Glucose, Bld: 92 mg/dL (ref 70–99)
HCT: 30 % — ABNORMAL LOW (ref 36.0–46.0)
HEMOGLOBIN: 10.2 g/dL — AB (ref 12.0–15.0)
POTASSIUM: 4.7 meq/L (ref 3.7–5.3)
SODIUM: 138 meq/L (ref 137–147)
TCO2: 27 mmol/L (ref 0–100)

## 2013-07-29 MED ORDER — DIAZEPAM 5 MG PO TABS
10.0000 mg | ORAL_TABLET | Freq: Once | ORAL | Status: AC
  Administered 2013-07-29: 10 mg via ORAL
  Filled 2013-07-29: qty 2

## 2013-07-29 MED ORDER — HYDROCODONE-ACETAMINOPHEN 5-325 MG PO TABS
1.0000 | ORAL_TABLET | Freq: Once | ORAL | Status: AC
  Administered 2013-07-29: 1 via ORAL
  Filled 2013-07-29: qty 1

## 2013-07-29 NOTE — ED Notes (Addendum)
Pt reports that she is a dialysis pt, had dialysis yesterday and began having back pain, states that she went home and has had increased back pain and has not been feeling well. Pt reports she has decreased urinary output, reports that she feel like her "fluid is building up." Pt a&o x4, skin warm and dry.

## 2013-07-29 NOTE — ED Provider Notes (Signed)
  Face-to-face evaluation   History: She complains of right lower back pain for 2 days, and right knee pain. The pain at each of these sites is worse with movement, and walking. She denies trauma  Physical exam: Alert, in mild pain. She ambulates with a slight limp. There is no overt weakness of the right leg. There is mild right lumbar tenderness to palpation.  Medical screening examination/treatment/procedure(s) were conducted as a shared visit with non-physician practitioner(s) and myself.  I personally evaluated the patient during the encounter  Richarda Blade, MD 08/02/13 [phone]

## 2013-07-29 NOTE — ED Provider Notes (Signed)
CSN: [id number]     Arrival date & time 07/29/13  2033 History   First MD Initiated Contact with Patient 07/29/13 2104     Chief Complaint  Patient presents with  . Back Pain     (Consider location/radiation/quality/duration/timing/severity/associated sxs/prior Treatment) The history is provided by the patient and medical records. No language interpreter was used.    Desiree [name] is a 38 y.o. female  with a hx of ESRD on dialysis (MWF and attended yesterday), HTN, asthma presents to the Emergency Department complaining of gradual, persistent, progressively worsening low back pain onset yesterday afternoon.  Pt reports taking tylenol without relief.  She reports she has had back pain in the past.  Pt reports the percocet that she had at home gave relief.  Pt reports that she makes urine, but denies dysuria, hematuria, fever, chills, abd pain, N/V/D, weakness, dizziness, syncope. Pt also c/o right knee pain that is chronic for her.  Pt reports standing and walking makes it worse.  Pt denies falls, heavy lifting or known injury.       Past Medical History  Diagnosis Date  . Blood transfusion   . Preterm labor   . Dialysis patient   . Chronic kidney disease   . Hypertension   . Asthma     as a child  . Headache(784.0)   . GERD (gastroesophageal reflux disease)     nexium prn   Past Surgical History  Procedure Laterality Date  . Renal biopsy    . Dilation and curettage of uterus    . Dilation and evacuation  08/02/2011    Procedure: DILATATION AND EVACUATION;  Surgeon: Logan Bores, MD;  Location: [facility];  Service: Gynecology;;  . Av fistula placement Left 2010  . Revision of arteriovenous goretex graft Left 02/11/2013    Procedure: REVISION OF ARTERIOVENOUS GORTEX FISTULA;  Surgeon: Rosetta Posner, MD;  Location: [facility];  Service: Vascular;  Laterality: Left;   Family History  Problem Relation Age of Onset  . Hypertension    . Diabetes type II     History  Substance Use  Topics  . Smoking status: Never Smoker   . Smokeless tobacco: Never Used  . Alcohol Use: Yes     Comment: occ   OB History   Grav Para Term Preterm Abortions TAB SAB Ect Mult Living   5 2 1 1 1 1  0 0 0 1     Review of Systems  Constitutional: Negative for fever and fatigue.  Respiratory: Negative for chest tightness and shortness of breath.   Cardiovascular: Negative for chest pain.  Gastrointestinal: Negative for nausea, vomiting, abdominal pain and diarrhea.  Genitourinary: Negative for dysuria, urgency, frequency and hematuria.  Musculoskeletal: Positive for back pain and gait problem ( 2/2 pain). Negative for joint swelling, neck pain and neck stiffness.  Skin: Negative for rash.  Neurological: Negative for weakness, light-headedness, numbness and headaches.  All other systems reviewed and are negative.     Allergies  Morphine and related and Prednisone  Home Medications   Prior to Admission medications   Medication Sig Start Date End Date Taking? Authorizing Provider  albuterol (PROVENTIL HFA;VENTOLIN HFA) 108 (90 BASE) MCG/ACT inhaler Inhale 1-2 puffs into the lungs every 6 (six) hours as needed for wheezing or shortness of breath.   Yes Historical Provider, MD  calcium acetate (PHOSLO) 667 MG capsule Take 2,668 mg by mouth 3 (three) times daily with meals.    Yes Historical Provider,  MD  cinacalcet (SENSIPAR) 60 MG tablet Take 60 mg by mouth daily.   Yes Historical Provider, MD  esomeprazole (NEXIUM) 40 MG capsule Take 40 mg by mouth daily as needed (heart burn).    Yes Historical Provider, MD  gabapentin (NEURONTIN) 100 MG capsule Take 200 mg by mouth at bedtime.   Yes Historical Provider, MD  lanthanum (FOSRENOL) 1000 MG chewable tablet Chew 1,000 mg by mouth 2 (two) times daily with a meal.    Yes Historical Provider, MD  zolpidem (AMBIEN) 5 MG tablet Take 5 mg by mouth at bedtime as needed for sleep.   Yes Historical Provider, MD   BP 117/68  Pulse 87  Temp(Src)  98.9 F (37.2 C) (Rectal)  Resp 20  SpO2 96%  LMP 07/14/2013 Physical Exam  Nursing note and vitals reviewed. Constitutional: She is oriented to person, place, and time. She appears well-developed and well-nourished. No distress.  HENT:  Head: Normocephalic and atraumatic.  Mouth/Throat: Oropharynx is clear and moist. No oropharyngeal exudate.  Eyes: Conjunctivae are normal.  Neck: Normal range of motion. Neck supple.  Full ROM without pain  Cardiovascular: Normal rate, regular rhythm and intact distal pulses.   Pulmonary/Chest: Effort normal and breath sounds normal. No respiratory distress. She has no wheezes.  Abdominal: Soft. She exhibits no distension. There is no tenderness.  Musculoskeletal:  Full range of motion of the T-spine and L-spine No tenderness to palpation of the spinous processes of the T-spine or L-spine Mild tenderness to palpation of the left paraspinous muscles of the T spine and L-spine  Lymphadenopathy:    She has no cervical adenopathy.  Neurological: She is alert and oriented to person, place, and time. She has normal reflexes. She exhibits normal muscle tone. Coordination normal.  Speech is clear and goal oriented, follows commands Normal 5/5 strength in upper and lower extremities bilaterally including dorsiflexion and plantar flexion, strong and equal grip strength Sensation normal to light and sharp touch Moves extremities without ataxia, coordination intact Antalgic gait; Normal balance   Skin: Skin is warm and dry. No rash noted. She is not diaphoretic. No erythema.  Psychiatric: She has a normal mood and affect. Her behavior is normal.    ED Course  Procedures (including critical care time) Labs Review Labs Reviewed  I-STAT CHEM 8, ED - Abnormal; Notable for the following:    BUN 38 (*)    Creatinine, Ser 12.30 (*)    Calcium, Ion 1.28 (*)    Hemoglobin 10.2 (*)    HCT 30.0 (*)    All other components within normal limits  URINALYSIS,  ROUTINE W REFLEX MICROSCOPIC    Imaging Review No results found.   EKG Interpretation None      MDM   Final diagnoses:  Low back pain  ESRD (end stage renal disease)   Desiree [name] presents with gradually worsening back pain for the last 2 days.  Pt denies known injury.  Pt with increased pain with movement and palpation but no true CVA tenderness.  Pt reports that she makes urine and denies dysuria, but she has low grade fever here in the department. Will give muscle relaxer, pain control and check chem8 and UA.  11:00PM  Pt with elevated BUN and Creatinine as expected, below previous levels and no hyperkalemia. UA pending.  Pt BP normal without hypotension; doubt retroperitoneal hemorrhage.  12:00AM UA continues to pend.  Pt evaluated by Dr. Eulis Foster who agrees that pain is likely MSK in nature,  but recommends that we check UA.  She reports mild right lumbar tenderness on his exam, but was very clear with me about left lumbar tenderness.   1:04 AM Pt has still not urinated.  Discussed with Gertha Calkin, PA-C who will follow.    Jarrett Soho Jaliel Deavers, PA-C 07/30/13 [phone]

## 2013-07-29 NOTE — ED Notes (Signed)
Pt ambulated in hall approximately 30 ft w/ moderate assist.  Pt's gait was unsteady.  Pt attempted to urinate however was unable to.

## 2013-07-30 LAB — URINALYSIS, ROUTINE W REFLEX MICROSCOPIC
Bilirubin Urine: NEGATIVE
Glucose, UA: 100 mg/dL — AB
Ketones, ur: NEGATIVE mg/dL
Leukocytes, UA: NEGATIVE
Nitrite: NEGATIVE
Protein, ur: 100 mg/dL — AB
SPECIFIC GRAVITY, URINE: 1.007 (ref 1.005–1.030)
Urobilinogen, UA: 0.2 mg/dL (ref 0.0–1.0)
pH: 8 (ref 5.0–8.0)

## 2013-07-30 LAB — URINE MICROSCOPIC-ADD ON

## 2013-07-30 MED ORDER — HYDROCODONE-ACETAMINOPHEN 5-325 MG PO TABS
1.0000 | ORAL_TABLET | ORAL | Status: DC | PRN
Start: 2013-07-30 — End: 2013-08-29

## 2013-07-30 MED ORDER — METHOCARBAMOL 500 MG PO TABS: 500.0000 mg | ORAL_TABLET | Freq: Two times a day (BID) | ORAL | Status: DC

## 2013-07-30 NOTE — ED Provider Notes (Deleted)
Medical screening examination/treatment/procedure(s) were performed by non-physician practitioner and as supervising physician I was immediately available for consultation/collaboration.   EKG Interpretation None       Richarda Blade, MD 07/30/13 [phone]

## 2013-07-30 NOTE — Discharge Instructions (Signed)
1. Medications: robaxin, vicodin, usual home medications 2. Treatment: rest, drink plenty of fluids, gentle stretching as discussed, alternate ice and heat 3. Follow Up: Please followup with your primary doctor for discussion of your diagnoses and further evaluation after today's visit; if you do not have a primary care doctor use the resource guide provided to find one;   Back Exercises Back exercises help treat and prevent back injuries. The goal of back exercises is to increase the strength of your abdominal and back muscles and the flexibility of your back. These exercises should be started when you no longer have back pain. Back exercises include:  Pelvic Tilt. Lie on your back with your knees bent. Tilt your pelvis until the lower part of your back is against the floor. Hold this position 5 to 10 sec and repeat 5 to 10 times.  Knee to Chest. Pull first 1 knee up against your chest and hold for 20 to 30 seconds, repeat this with the other knee, and then both knees. This may be done with the other leg straight or bent, whichever feels better.  Sit-Ups or Curl-Ups. Bend your knees 90 degrees. Start with tilting your pelvis, and do a partial, slow sit-up, lifting your trunk only 30 to 45 degrees off the floor. Take at least 2 to 3 seconds for each sit-up. Do not do sit-ups with your knees out straight. If partial sit-ups are difficult, simply do the above but with only tightening your abdominal muscles and holding it as directed.  Hip-Lift. Lie on your back with your knees flexed 90 degrees. Push down with your feet and shoulders as you raise your hips a couple inches off the floor; hold for 10 seconds, repeat 5 to 10 times.  Back arches. Lie on your stomach, propping yourself up on bent elbows. Slowly press on your hands, causing an arch in your low back. Repeat 3 to 5 times. Any initial stiffness and discomfort should lessen with repetition over time.  Shoulder-Lifts. Lie face down with arms  beside your body. Keep hips and torso pressed to floor as you slowly lift your head and shoulders off the floor. Do not overdo your exercises, especially in the beginning. Exercises may cause you some mild back discomfort which lasts for a few minutes; however, if the pain is more severe, or lasts for more than 15 minutes, do not continue exercises until you see your caregiver. Improvement with exercise therapy for back problems is slow.  See your caregivers for assistance with developing a proper back exercise program. Document Released: 04/09/2004 Document Revised: 05/25/2011 Document Reviewed: 01/01/2011 [employer] Patient Information 2014 [place].

## 2013-08-08 ENCOUNTER — Inpatient Hospital Stay (HOSPITAL_COMMUNITY)
Admission: AD | Admit: 2013-08-08 | Discharge: 2013-08-08 | Disposition: A | Discharge status: Home / Self Care | Payer: Medicare Other | Source: Ambulatory Visit | Attending: Obstetrics & Gynecology | Admitting: Obstetrics & Gynecology

## 2013-08-08 ENCOUNTER — Encounter (HOSPITAL_COMMUNITY): Payer: Self-pay

## 2013-08-08 ENCOUNTER — Inpatient Hospital Stay (HOSPITAL_COMMUNITY): Payer: Medicare Other

## 2013-08-08 DIAGNOSIS — K219 Gastro-esophageal reflux disease without esophagitis: Secondary | ICD-10-CM | POA: Diagnosis not present

## 2013-08-08 DIAGNOSIS — M549 Dorsalgia, unspecified: Secondary | ICD-10-CM | POA: Diagnosis not present

## 2013-08-08 DIAGNOSIS — R109 Unspecified abdominal pain: Secondary | ICD-10-CM | POA: Diagnosis present

## 2013-08-08 DIAGNOSIS — N049 Nephrotic syndrome with unspecified morphologic changes: Secondary | ICD-10-CM | POA: Insufficient documentation

## 2013-08-08 DIAGNOSIS — O99891 Other specified diseases and conditions complicating pregnancy: Secondary | ICD-10-CM | POA: Diagnosis not present

## 2013-08-08 DIAGNOSIS — O09529 Supervision of elderly multigravida, unspecified trimester: Secondary | ICD-10-CM | POA: Diagnosis not present

## 2013-08-08 DIAGNOSIS — O9989 Other specified diseases and conditions complicating pregnancy, childbirth and the puerperium: Principal | ICD-10-CM

## 2013-08-08 DIAGNOSIS — Z3490 Encounter for supervision of normal pregnancy, unspecified, unspecified trimester: Secondary | ICD-10-CM

## 2013-08-08 LAB — URINALYSIS, ROUTINE W REFLEX MICROSCOPIC
Bilirubin Urine: NEGATIVE
GLUCOSE, UA: 250 mg/dL — AB
KETONES UR: NEGATIVE mg/dL
NITRITE: NEGATIVE
PROTEIN: 100 mg/dL — AB
Specific Gravity, Urine: 1.015 (ref 1.005–1.030)
Urobilinogen, UA: 0.2 mg/dL (ref 0.0–1.0)
pH: 8.5 — ABNORMAL HIGH (ref 5.0–8.0)

## 2013-08-08 LAB — URINE MICROSCOPIC-ADD ON

## 2013-08-08 LAB — POCT PREGNANCY, URINE: PREG TEST UR: POSITIVE — AB

## 2013-08-08 LAB — HCG, QUANTITATIVE, PREGNANCY: HCG, BETA CHAIN, QUANT, S: 231262 m[IU]/mL — AB (ref <5)

## 2013-08-08 NOTE — MAU Note (Signed)
Patient states she has had low back and lower abdominal pain for 4 days. Denies bleeding or vaginal discharge.

## 2013-08-08 NOTE — MAU Provider Note (Signed)
First Provider Initiated Contact with Patient 08/08/13 1807      Chief Complaint:  Back Pain and Abdominal Pain   Desiree [name] is  38 y.o. [id number] at [redacted]w[redacted]d by LMP presents complaining of Back Pain and Abdominal Pain She feels "sore" across lower abdomen and "achey" in her back.  She is on dialysis for nephrotic syndrome several days a week.  Denies bleeding/cramping  Obstetrical/Gynecological History: OB History   Grav Para Term Preterm Abortions TAB SAB Ect Mult Living   6 1 1  0 4 2 2  0 0 1     Past Medical History: Past Medical History  Diagnosis Date  . Blood transfusion   . Preterm labor   . Dialysis patient   . Chronic kidney disease   . Hypertension   . Asthma     as a child  . Headache(784.0)   . GERD (gastroesophageal reflux disease)     nexium prn    Past Surgical History: Past Surgical History  Procedure Laterality Date  . Renal biopsy    . Dilation and curettage of uterus    . Dilation and evacuation  08/02/2011    Procedure: DILATATION AND EVACUATION;  Surgeon: Logan Bores, MD;  Location: [facility];  Service: Gynecology;;  . Av fistula placement Left 2010  . Revision of arteriovenous goretex graft Left 02/11/2013    Procedure: REVISION OF ARTERIOVENOUS GORTEX FISTULA;  Surgeon: Rosetta Posner, MD;  Location: [facility] OR;  Service: Vascular;  Laterality: Left;    Family History: Family History  Problem Relation Age of Onset  . Hypertension    . Diabetes type II      Social History: History  Substance Use Topics  . Smoking status: Never Smoker   . Smokeless tobacco: Never Used  . Alcohol Use: No    Allergies:  Allergies  Allergen Reactions  . Morphine And Related Anaphylaxis    Patient says she stops breathing Can take percocet  . Prednisone Other (See Comments)    Patient says prednisone causes her to cramp all over, muscle spasms uncontrolled    Meds:  Prescriptions prior to admission  Medication Sig Dispense Refill  . albuterol  (PROVENTIL HFA;VENTOLIN HFA) 108 (90 BASE) MCG/ACT inhaler Inhale 1-2 puffs into the lungs every 6 (six) hours as needed for wheezing or shortness of breath.      . calcium acetate (PHOSLO) 667 MG capsule Take 2,668 mg by mouth 3 (three) times daily with meals.       . cinacalcet (SENSIPAR) 60 MG tablet Take 60 mg by mouth daily.      . diphenhydrAMINE (BENADRYL) 25 MG tablet Take 25 mg by mouth every 6 (six) hours as needed.      . gabapentin (NEURONTIN) 100 MG capsule Take 200 mg by mouth at bedtime.      Marland Kitchen HYDROcodone-acetaminophen (NORCO/VICODIN) 5-325 MG per tablet Take 1-2 tablets by mouth every 4 (four) hours as needed.  10 tablet  0  . lanthanum (FOSRENOL) 1000 MG chewable tablet Chew 1,000 mg by mouth 2 (two) times daily with a meal.       . methocarbamol (ROBAXIN) 500 MG tablet Take 500 mg by mouth daily.      Marland Kitchen zolpidem (AMBIEN) 5 MG tablet Take 5 mg by mouth at bedtime as needed for sleep.      . [DISCONTINUED] methocarbamol (ROBAXIN) 500 MG tablet Take 1 tablet (500 mg total) by mouth 2 (two) times daily.  20 tablet  0  .  esomeprazole (NEXIUM) 40 MG capsule Take 40 mg by mouth daily as needed (heart burn).         Review of Systems   Constitutional: Negative for fever and chills Eyes: Negative for visual disturbances Respiratory: Negative for shortness of breath, dyspnea Cardiovascular: Negative for chest pain or palpitations  Gastrointestinal: Negative for vomiting, diarrhea and constipation Genitourinary: Negative for dysuria and urgency Musculoskeletal: Negative for joint pain, myalgias  Neurological: Negative for dizziness and headaches     Physical Exam  Blood pressure 147/73, pulse 92, temperature 98.4 F (36.9 C), temperature source Oral, resp. rate 16, height 5' 3.5" (1.613 m), weight 82.736 kg (182 lb 6.4 oz), last menstrual period 07/14/2013, SpO2 100.00%. GENERAL: Well-developed, well-nourished female in no acute distress.  LUNGS: Clear to auscultation  bilaterally.  HEART: Regular rate and rhythm. ABDOMEN: Soft, nontender, nondistended, gravid.  EXTREMITIES: Nontender, no edema, 2+ distal pulses. DTR's 2+   Labs: Results for orders placed during the hospital encounter of 08/08/13 (from the past 24 hour(s))  URINALYSIS, ROUTINE W REFLEX MICROSCOPIC   Collection Time    08/08/13 12:51 PM      Result Value Ref Range   Color, Urine YELLOW  YELLOW   APPearance CLOUDY (*) CLEAR   Specific Gravity, Urine 1.015  1.005 - 1.030   pH 8.5 (*) 5.0 - 8.0   Glucose, UA 250 (*) NEGATIVE mg/dL   Hgb urine dipstick LARGE (*) NEGATIVE   Bilirubin Urine NEGATIVE  NEGATIVE   Ketones, ur NEGATIVE  NEGATIVE mg/dL   Protein, ur 100 (*) NEGATIVE mg/dL   Urobilinogen, UA 0.2  0.0 - 1.0 mg/dL   Nitrite NEGATIVE  NEGATIVE   Leukocytes, UA TRACE (*) NEGATIVE  URINE MICROSCOPIC-ADD ON   Collection Time    08/08/13 12:51 PM      Result Value Ref Range   Squamous Epithelial / LPF MANY (*) RARE   WBC, UA 3-6  <3 WBC/hpf   RBC / HPF 11-20  <3 RBC/hpf   Bacteria, UA MANY (*) RARE   Urine-Other MUCOUS PRESENT    POCT PREGNANCY, URINE   Collection Time    08/08/13  2:23 PM      Result Value Ref Range   Preg Test, Ur POSITIVE (*) NEGATIVE  HCG, QUANTITATIVE, PREGNANCY   Collection Time    08/08/13  6:06 PM      Result Value Ref Range   hCG, Beta Chain, Laqueta Carina [id number] (*) <5 mIU/mL   U/S showe 7 week IUP with + FCA  Assessment: Desiree [name] is  38 y.o. [id number] at [redacted]w[redacted]d presents with early IUP.  Plan: Begin [facility] 5/26/20158:08 PM

## 2013-08-08 NOTE — Discharge Instructions (Signed)
Pregnancy - First Trimester  During sexual intercourse, millions of sperm go into the vagina. Only 1 sperm will penetrate and fertilize the female egg while it is in the Fallopian tube. One week later, the fertilized egg implants into the wall of the uterus. An embryo begins to develop into a baby. At 6 to 8 weeks, the eyes and face are formed and the heartbeat can be seen on ultrasound. At the end of 12 weeks (first trimester), all the baby's organs are formed. Now that you are pregnant, you will want to do everything you can to have a healthy baby. Two of the most important things are to get good prenatal care and follow your caregiver's instructions. Prenatal care is all the medical care you receive before the baby's birth. It is given to prevent, find, and treat problems during the pregnancy and childbirth.  PRENATAL EXAMS  · During prenatal visits, your weight, blood pressure, and urine are checked. This is done to make sure you are healthy and progressing normally during the pregnancy.  · A pregnant woman should gain 25 to 35 pounds during the pregnancy. However, if you are overweight or underweight, your caregiver will advise you regarding your weight.  · Your caregiver will ask and answer questions for you.  · Blood work, cervical cultures, other necessary tests, and a Pap test are done during your prenatal exams. These tests are done to check on your health and the probable health of your baby. Tests are strongly recommended and done for HIV with your permission. This is the virus that causes AIDS. These tests are done because medicines can be given to help prevent your baby from being born with this infection should you have been infected without knowing it. Blood work is also used to find out your blood type, previous infections, and follow your blood levels (hemoglobin).  · Low hemoglobin (anemia) is common during pregnancy. Iron and vitamins are given to help prevent this. Later in the pregnancy, blood  tests for diabetes will be done along with any other tests if any problems develop.  · You may need other tests to make sure you and the baby are doing well.  CHANGES DURING THE FIRST TRIMESTER   Your body goes through many changes during pregnancy. They vary from person to person. Talk to your caregiver about changes you notice and are concerned about. Changes can include:  · Your menstrual period stops.  · The egg and sperm carry the genes that determine what you look like. Genes from you and your partner are forming a baby. The female genes determine whether the baby is a boy or a girl.  · Your body increases in girth and you may feel bloated.  · Feeling sick to your stomach (nauseous) and throwing up (vomiting). If the vomiting is uncontrollable, call your caregiver.  · Your breasts will begin to enlarge and become tender.  · Your nipples may stick out more and become darker.  · The need to urinate more. Painful urination may mean you have a bladder infection.  · Tiring easily.  · Loss of appetite.  · Cravings for certain kinds of food.  · At first, you may gain or lose a couple of pounds.  · You may have changes in your emotions from day to day (excited to be pregnant or concerned something may go wrong with the pregnancy and baby).  · You may have more vivid and strange dreams.  HOME CARE INSTRUCTIONS   ·   It is very important to avoid all smoking, alcohol and non-prescribed drugs during your pregnancy. These affect the formation and growth of the baby. Avoid chemicals while pregnant to ensure the delivery of a healthy infant.  · Start your prenatal visits by the 12th week of pregnancy. They are usually scheduled monthly at first, then more often in the last 2 months before delivery. Keep your caregiver's appointments. Follow your caregiver's instructions regarding medicine use, blood and lab tests, exercise, and diet.  · During pregnancy, you are providing food for you and your baby. Eat regular, well-balanced  meals. Choose foods such as meat, fish, milk and other low fat dairy products, vegetables, fruits, and whole-grain breads and cereals. Your caregiver will tell you of the ideal weight gain.  · You can help morning sickness by keeping soda crackers at the bedside. Eat a couple before arising in the morning. You may want to use the crackers without salt on them.  · Eating 4 to 5 small meals rather than 3 large meals a day also may help the nausea and vomiting.  · Drinking liquids between meals instead of during meals also seems to help nausea and vomiting.  · A physical sexual relationship may be continued throughout pregnancy if there are no other problems. Problems may be early (premature) leaking of amniotic fluid from the membranes, vaginal bleeding, or belly (abdominal) pain.  · Exercise regularly if there are no restrictions. Check with your caregiver or physical therapist if you are unsure of the safety of some of your exercises. Greater weight gain will occur in the last 2 trimesters of pregnancy. Exercising will help:  · Control your weight.  · Keep you in shape.  · Prepare you for labor and delivery.  · Help you lose your pregnancy weight after you deliver your baby.  · Wear a good support or jogging bra for breast tenderness during pregnancy. This may help if worn during sleep too.  · Ask when prenatal classes are available. Begin classes when they are offered.  · Do not use hot tubs, steam rooms, or saunas.  · Wear your seat belt when driving. This protects you and your baby if you are in an accident.  · Avoid raw meat, uncooked cheese, cat litter boxes, and soil used by cats throughout the pregnancy. These carry germs that can cause birth defects in the baby.  · The first trimester is a good time to visit your dentist for your dental health. Getting your teeth cleaned is okay. Use a softer toothbrush and brush gently during pregnancy.  · Ask for help if you have financial, counseling, or nutritional needs  during pregnancy. Your caregiver will be able to offer counseling for these needs as well as refer you for other special needs.  · Do not take any medicines or herbs unless told by your caregiver.  · Inform your caregiver if there is any mental or physical domestic violence.  · Make a list of emergency phone numbers of family, friends, hospital, and police and fire departments.  · Write down your questions. Take them to your prenatal visit.  · Do not douche.  · Do not cross your legs.  · If you have to stand for long periods of time, rotate you feet or take small steps in a circle.  · You may have more vaginal secretions that may require a sanitary pad. Do not use tampons or scented sanitary pads.  MEDICINES AND DRUG USE IN PREGNANCY  ·   Take prenatal vitamins as directed. The vitamin should contain 1 milligram of folic acid. Keep all vitamins out of reach of children. Only a couple vitamins or tablets containing iron may be fatal to a baby or young child when ingested.  · Avoid use of all medicines, including herbs, over-the-counter medicines, not prescribed or suggested by your caregiver. Only take over-the-counter or prescription medicines for pain, discomfort, or fever as directed by your caregiver. Do not use aspirin, ibuprofen, or naproxen unless directed by your caregiver.  · Let your caregiver also know about herbs you may be using.  · Alcohol is related to a number of birth defects. This includes fetal alcohol syndrome. All alcohol, in any form, should be avoided completely. Smoking will cause low birth rate and premature babies.  · Street or illegal drugs are very harmful to the baby. They are absolutely forbidden. A baby born to an addicted mother will be addicted at birth. The baby will go through the same withdrawal an adult does.  · Let your caregiver know about any medicines that you have to take and for what reason you take them.  SEEK MEDICAL CARE IF:   You have any concerns or worries during your  pregnancy. It is better to call with your questions if you feel they cannot wait, rather than worry about them.  SEEK IMMEDIATE MEDICAL CARE IF:   · An unexplained oral temperature above 102° F (38.9° C) develops, or as your caregiver suggests.  · You have leaking of fluid from the vagina (birth canal). If leaking membranes are suspected, take your temperature and inform your caregiver of this when you call.  · There is vaginal spotting or bleeding. Notify your caregiver of the amount and how many pads are used.  · You develop a bad smelling vaginal discharge with a change in the color.  · You continue to feel sick to your stomach (nauseated) and have no relief from remedies suggested. You vomit blood or coffee ground-like materials.  · You lose more than 2 pounds of weight in 1 week.  · You gain more than 2 pounds of weight in 1 week and you notice swelling of your face, hands, feet, or legs.  · You gain 5 pounds or more in 1 week (even if you do not have swelling of your hands, face, legs, or feet).  · You get exposed to German measles and have never had them.  · You are exposed to fifth disease or chickenpox.  · You develop belly (abdominal) pain. Round ligament discomfort is a common non-cancerous (benign) cause of abdominal pain in pregnancy. Your caregiver still must evaluate this.  · You develop headache, fever, diarrhea, pain with urination, or shortness of breath.  · You fall or are in a car accident or have any kind of trauma.  · There is mental or physical violence in your home.  Document Released: 02/24/2001 Document Revised: 11/25/2011 Document Reviewed: 08/28/2008  [employer]® Patient Information ©2014 [employer].

## 2013-08-08 NOTE — MAU Note (Signed)
SAYS SHE HAS BEEN  HAVING LOWER BACK AND ABD  PAIN  - STARTED WED NIGHT   .   PT ON DIALYSIS-   MON/ WED / FRI.    NO VAG BLEEDING.   LMP- 4-29.  LAST SEX- 5-12.  NO BIRTH CONTROL.  HURTS IN AM TO VOID BUT NO OTHER TIME.

## 2013-08-22 ENCOUNTER — Ambulatory Visit (HOSPITAL_COMMUNITY)
Admission: RE | Admit: 2013-08-22 | Discharge: 2013-08-22 | Disposition: A | Discharge status: Home / Self Care | Payer: Medicare Other | Source: Ambulatory Visit | Attending: Obstetrics & Gynecology | Admitting: Obstetrics & Gynecology

## 2013-08-22 ENCOUNTER — Other Ambulatory Visit (HOSPITAL_COMMUNITY)
Admission: RE | Admit: 2013-08-22 | Discharge: 2013-08-22 | Disposition: A | Discharge status: Home / Self Care | Payer: Medicare Other | Source: Ambulatory Visit | Attending: Obstetrics & Gynecology | Admitting: Obstetrics & Gynecology

## 2013-08-22 ENCOUNTER — Ambulatory Visit (INDEPENDENT_AMBULATORY_CARE_PROVIDER_SITE_OTHER): Payer: Medicare Other | Admitting: Obstetrics & Gynecology

## 2013-08-22 ENCOUNTER — Encounter: Payer: Self-pay | Admitting: Obstetrics & Gynecology

## 2013-08-22 VITALS — BP 138/90 | HR 90 | Temp 97.9°F | Ht 65.0 in | Wt 180.0 lb

## 2013-08-22 DIAGNOSIS — Z6791 Unspecified blood type, Rh negative: Secondary | ICD-10-CM | POA: Insufficient documentation

## 2013-08-22 DIAGNOSIS — O208 Other hemorrhage in early pregnancy: Secondary | ICD-10-CM | POA: Insufficient documentation

## 2013-08-22 DIAGNOSIS — O34599 Maternal care for other abnormalities of gravid uterus, unspecified trimester: Secondary | ICD-10-CM | POA: Diagnosis not present

## 2013-08-22 DIAGNOSIS — N186 End stage renal disease: Secondary | ICD-10-CM

## 2013-08-22 DIAGNOSIS — N898 Other specified noninflammatory disorders of vagina: Secondary | ICD-10-CM | POA: Diagnosis present

## 2013-08-22 DIAGNOSIS — F32A Depression, unspecified: Secondary | ICD-10-CM

## 2013-08-22 DIAGNOSIS — O26891 Other specified pregnancy related conditions, first trimester: Secondary | ICD-10-CM

## 2013-08-22 DIAGNOSIS — Z124 Encounter for screening for malignant neoplasm of cervix: Secondary | ICD-10-CM | POA: Insufficient documentation

## 2013-08-22 DIAGNOSIS — N831 Corpus luteum cyst of ovary, unspecified side: Secondary | ICD-10-CM | POA: Insufficient documentation

## 2013-08-22 DIAGNOSIS — Z113 Encounter for screening for infections with a predominantly sexual mode of transmission: Secondary | ICD-10-CM | POA: Insufficient documentation

## 2013-08-22 DIAGNOSIS — F3289 Other specified depressive episodes: Secondary | ICD-10-CM

## 2013-08-22 DIAGNOSIS — O26839 Pregnancy related renal disease, unspecified trimester: Secondary | ICD-10-CM | POA: Diagnosis not present

## 2013-08-22 DIAGNOSIS — A499 Bacterial infection, unspecified: Secondary | ICD-10-CM

## 2013-08-22 DIAGNOSIS — O021 Missed abortion: Secondary | ICD-10-CM

## 2013-08-22 DIAGNOSIS — F329 Major depressive disorder, single episode, unspecified: Secondary | ICD-10-CM

## 2013-08-22 DIAGNOSIS — O26899 Other specified pregnancy related conditions, unspecified trimester: Secondary | ICD-10-CM

## 2013-08-22 DIAGNOSIS — O9989 Other specified diseases and conditions complicating pregnancy, childbirth and the puerperium: Secondary | ICD-10-CM

## 2013-08-22 DIAGNOSIS — D649 Anemia, unspecified: Secondary | ICD-10-CM

## 2013-08-22 DIAGNOSIS — O360990 Maternal care for other rhesus isoimmunization, unspecified trimester, not applicable or unspecified: Secondary | ICD-10-CM

## 2013-08-22 DIAGNOSIS — IMO0002 Reserved for concepts with insufficient information to code with codable children: Secondary | ICD-10-CM

## 2013-08-22 DIAGNOSIS — N760 Acute vaginitis: Secondary | ICD-10-CM

## 2013-08-22 DIAGNOSIS — Z1151 Encounter for screening for human papillomavirus (HPV): Secondary | ICD-10-CM | POA: Insufficient documentation

## 2013-08-22 DIAGNOSIS — B9689 Other specified bacterial agents as the cause of diseases classified elsewhere: Secondary | ICD-10-CM

## 2013-08-22 LAB — COMPREHENSIVE METABOLIC PANEL
ALBUMIN: 3.5 g/dL (ref 3.5–5.2)
ALK PHOS: 36 U/L — AB (ref 39–117)
ALT: <8 U/L (ref 0–35)
AST: 7 U/L (ref 0–37)
BUN: 75 mg/dL — AB (ref 6–23)
CALCIUM: 10.4 mg/dL (ref 8.4–10.5)
CHLORIDE: 92 meq/L — AB (ref 96–112)
CO2: 22 mEq/L (ref 19–32)
Creat: 19.35 mg/dL (ref 0.50–1.10)
Glucose, Bld: 89 mg/dL (ref 70–99)
POTASSIUM: 5 meq/L (ref 3.5–5.3)
SODIUM: 136 meq/L (ref 135–145)
TOTAL PROTEIN: 6.5 g/dL (ref 6.0–8.3)
Total Bilirubin: 0.3 mg/dL (ref 0.2–1.2)

## 2013-08-22 LAB — POCT URINALYSIS DIP (DEVICE)
BILIRUBIN URINE: NEGATIVE
GLUCOSE, UA: 100 mg/dL — AB
Ketones, ur: NEGATIVE mg/dL
NITRITE: NEGATIVE
Protein, ur: >=300 mg/dL — AB
SPECIFIC GRAVITY, URINE: 1.02 (ref 1.005–1.030)
Urobilinogen, UA: 0.2 mg/dL (ref 0.0–1.0)
pH: 8.5 — ABNORMAL HIGH (ref 5.0–8.0)

## 2013-08-22 MED ORDER — PROMETHAZINE HCL 25 MG PO TABS: 25.0000 mg | ORAL_TABLET | Freq: Four times a day (QID) | ORAL | Status: DC | PRN

## 2013-08-22 MED ORDER — ZOLPIDEM TARTRATE 5 MG PO TABS: 5.0000 mg | ORAL_TABLET | Freq: Every evening | ORAL | Status: DC | PRN

## 2013-08-22 MED ORDER — PRENATAL VITAMINS 0.8 MG PO TABS: 1.0000 | ORAL_TABLET | Freq: Every day | ORAL | Status: DC

## 2013-08-22 MED ORDER — FLUCONAZOLE 150 MG PO TABS: 150.0000 mg | ORAL_TABLET | Freq: Once | ORAL | Status: DC

## 2013-08-22 NOTE — Progress Notes (Signed)
Reports "cheesy white" discharge with itching.  Discussed appropriate weight gain based on BMI (15-25lb); patient verbalized understanding.  New OB packet given.  New OB labs and pap today.

## 2013-08-22 NOTE — Progress Notes (Signed)
   Patient is a 38 yo G6P1041 at [redacted]w[redacted]d by 7 week ultrasound, has a history of ESRD and recurrent SAB, here for NOB visit. Had viable IUP at [redacted]w[redacted]d on 08/08/13.  Formal ultrasound today showed MAB, no cardiac activity. Of note, patient did have her NOB exam prior to the ultrasound.   08/22/2013  TRANSVAGINAL OB ULTRASOUND  CLINICAL DATA:  Vaginal discharge; end-stage renal disease ; assessment for viability     TECHNIQUE: Transvaginal ultrasound was performed for complete evaluation of the gestation as well as the maternal uterus, adnexal regions, and pelvic cul-de-sac.  COMPARISON:  Aug 08, 2013  FINDINGS: Intrauterine gestational sac: Visualized/normal in shape.  Yolk sac:  Not visualized  Embryo:  Visualized  Cardiac Activity: Not visualized  Heart Rate: 0 bpm  CRL:   11  mm   7 w 2 d  Maternal uterus/adnexae: Maternal uterus otherwise appears normal with the exception of a small subchorionic hemorrhage. There is a corpus luteum in the left ovary measuring 4.0 x 2.9 x 2.5 cm, containing a small amount of presumed hemorrhage. Maternal ovaries otherwise appear normal. There is no free maternal fluid.  IMPRESSION: There is a single fetus measuring 11 mm in length without demonstrable fetal cardiac activity. Findings meet definitive criteria for failed pregnancy. This follows SRU consensus guidelines: Diagnostic Criteria for Nonviable Pregnancy Early in the First Trimester. Alison Stalling J Med [id number].  Small subchorionic hemorrhage.  Hemorrhagic corpus luteum left ovary.   Electronically Signed   By: Lowella Grip M.D.   On: 08/22/2013 10:36   BP 138/90  Pulse 90  Temp(Src) 97.9 F (36.6 C)  Ht 5\' 5"  (1.651 m)  Wt 180 lb (81.647 kg)  BMI 29.95 kg/m2  LMP 07/14/2013 GENERAL: Well-developed, well-nourished female in no acute distress.  HEENT: Normocephalic, atraumatic. Sclerae anicteric.  NECK: Supple. Normal thyroid.  LUNGS: Clear to auscultation bilaterally.  HEART: Regular rate and rhythm. BREASTS:  Symmetric in size. No masses, skin changes, nipple drainage, or lymphadenopathy. ABDOMEN: Soft, nontender, nondistended. No organomegaly. PELVIC: Normal external female genitalia. Vagina is pink and rugated.  Yellow, malodorous discharge, no bleeding. Normal cervix contour. Pap smear obtained. Uterus is normal in size. No adnexal mass or tenderness.  EXTREMITIES: No cyanosis, clubbing, or edema, 2+ distal pulses.  Missed abortion diagnosis discussed with patient and her husband. Appropriate support given. Discussed management of missed abortion: expectant management vs misoprostol vs D&E.  Risks and benefits of all modalities discussed; all questions answered.  Patient opted for D&E.  Procedure scheduled on Tuesday 08/29/13 at 1330; she was given instructions for NPO after midnight and to come to the hospital 2 hours prior to her scheduled surgery time. Dr. Lyndle Herrlich, Anesthesiologist, was notified of this patient. Last Cr was 12.30 on 07/29/13; labs were rechecked today.  Patient was counseled about contraception; possibility of placed Mirena during procedure if desired.  She will consider this option, will also verify that this will not interfere with her kidney transplant process.    Wet prep sent given discharge, will follow up results and manage accordingly.  Ambien given as needed for insomnia, Phenergan given as needed for nausea as per patient's request.  Postoperative appointment to be scheduled for 3-4 weeks from now.   Verita Schneiders, MD, [place] Attending [place], [place]

## 2013-08-22 NOTE — Patient Instructions (Signed)
Dilation and Curettage or Vacuum Curettage Dilation and curettage (D&C) and vacuum curettage are minor procedures. A D&C involves stretching (dilation) the cervix and scraping (curettage) the inside lining of the womb (uterus). During a D&C, tissue is gently scraped from the inside lining of the uterus. During a vacuum curettage, the lining and tissue in the uterus are removed with the use of gentle suction.  Curettage may be performed to either diagnose or treat a problem. As a diagnostic procedure, curettage is performed to examine tissues from the uterus. A diagnostic curettage may be performed for the following symptoms:   Irregular bleeding in the uterus.   Bleeding with the development of clots.   Spotting between menstrual periods.   Prolonged menstrual periods.   Bleeding after menopause.   No menstrual period (amenorrhea).   A change in size and shape of the uterus.  As a treatment procedure, curettage may be performed for the following reasons:   Removal of an IUD (intrauterine device).   Removal of retained placenta after giving birth. Retained placenta can cause an infection or bleeding severe enough to require transfusions.   Abortion.   Miscarriage.   Removal of polyps inside the uterus.   Removal of uncommon types of noncancerous lumps (fibroids).  LET [facility] CARE PROVIDER KNOW ABOUT:   Any allergies you have.   All medicines you are taking, including vitamins, herbs, eye drops, creams, and over-the-counter medicines.   Previous problems you or members of your family have had with the use of anesthetics.   Any blood disorders you have.   Previous surgeries you have had.   Medical conditions you have. RISKS AND COMPLICATIONS  Generally, this is a safe procedure. However, as with any procedure, complications can occur. Possible complications include:  Excessive bleeding.   Infection of the uterus.   Damage to the cervix.    Development of scar tissue (adhesions) inside the uterus, later causing abnormal amounts of menstrual bleeding.   Complications from the general anesthetic, if a general anesthetic is used.   Putting a hole (perforation) in the uterus. This is rare.  BEFORE THE PROCEDURE   Eat and drink before the procedure only as directed by your health care provider.   Arrange for someone to take you home.  PROCEDURE  This procedure usually takes about 15 30 minutes.  You will be given one of the following:  A medicine that numbs the area in and around the cervix (local anesthetic).   A medicine to make you sleep through the procedure (general anesthetic).  You will lie on your back with your legs in stirrups.   A warm metal or plastic instrument (speculum) will be placed in your vagina to keep it open and to allow the health care provider to see the cervix.  There are two ways in which your cervix can be softened and dilated. These include:   Taking a medicine.   Having thin rods (laminaria) inserted into your cervix.   A curved tool (curette) will be used to scrape cells from the inside lining of the uterus. In some cases, gentle suction is applied with the curette. The curette will then be removed.  AFTER THE PROCEDURE   You will rest in the recovery area until you are stable and are ready to go home.   You may feel sick to your stomach (nauseous) or throw up (vomit) if you were given a general anesthetic.   You may have a sore throat if a  tube was placed in your throat during general anesthesia.   You may have light cramping and bleeding. This may last for 2 days to 2 weeks after the procedure.   Your uterus needs to make a new lining after the procedure. This may make your next period late. Document Released: 03/02/2005 Document Revised: 11/02/2012 Document Reviewed: 09/29/2012 [facility] Patient Information 2014 [place], [place].  Intrauterine Device  Information An intrauterine device (IUD) is inserted into your uterus to prevent pregnancy. There are two types of IUDs available:   Copper IUD This type of IUD is wrapped in copper wire and is placed inside the uterus. Copper makes the uterus and fallopian tubes produce a fluid that kills sperm. The copper IUD can stay in place for 10 years.  Hormone IUD This type of IUD contains the hormone progestin (synthetic progesterone). The hormone thickens the cervical mucus and prevents sperm from entering the uterus. It also thins the uterine lining to prevent implantation of a fertilized egg. The hormone can weaken or kill the sperm that get into the uterus. One type of hormone IUD can stay in place for 5 years, and another type can stay in place for 3 years. Your health care provider will make sure you are a good candidate for a contraceptive IUD. Discuss with your health care provider the possible side effects.  ADVANTAGES OF AN INTRAUTERINE DEVICE  IUDs are highly effective, reversible, long acting, and low maintenance.   There are no estrogen-related side effects.   An IUD can be used when breastfeeding.   IUDs are not associated with weight gain.   The copper IUD works immediately after insertion.   The hormone IUD works right away if inserted within 7 days of your period starting. You will need to use a backup method of birth control for 7 days if the hormone IUD is inserted at any other time in your cycle.  The copper IUD does not interfere with your female hormones.   The hormone IUD can make heavy menstrual periods lighter and decrease cramping.   The hormone IUD can be used for 3 or 5 years.   The copper IUD can be used for 10 years. DISADVANTAGES OF AN INTRAUTERINE DEVICE  The hormone IUD can be associated with irregular bleeding patterns.   The copper IUD can make your menstrual flow heavier and more painful.   You may experience cramping and vaginal bleeding after  insertion.  Document Released: 02/04/2004 Document Revised: 11/02/2012 Document Reviewed: 08/21/2012 [facility] Patient Information 2014 [place].

## 2013-08-23 ENCOUNTER — Telehealth: Payer: Self-pay

## 2013-08-23 LAB — WET PREP, GENITAL
Trich, Wet Prep: NONE SEEN
Yeast Wet Prep HPF POC: NONE SEEN

## 2013-08-23 LAB — PROTEIN / CREATININE RATIO, URINE
Creatinine, Urine: 86.2 mg/dL
PROTEIN CREATININE RATIO: 1.8 — AB (ref <0.15)
Total Protein, Urine: 155 mg/dL

## 2013-08-23 LAB — HIV ANTIBODY (ROUTINE TESTING W REFLEX): HIV 1&2 Ab, 4th Generation: NONREACTIVE

## 2013-08-23 MED ORDER — METRONIDAZOLE 500 MG PO TABS: 500.0000 mg | ORAL_TABLET | Freq: Two times a day (BID) | ORAL | Status: AC

## 2013-08-23 NOTE — Telephone Encounter (Signed)
Message copied by Geanie Logan on Wed Aug 23, 2013 11:00 AM ------      Message from: Verita Schneiders A      Created: Wed Aug 23, 2013  8:27 AM       Patient has BV. Metronidazole prescribed.  Please call to inform patient of results and advise her to pick up prescription.        ------

## 2013-08-23 NOTE — Telephone Encounter (Signed)
Called patient and informed of results and medication at pharmacy. Patient verbalized understanding. No further questions or concerns.

## 2013-08-23 NOTE — Addendum Note (Signed)
Addended by: Verita Schneiders A on: 08/23/2013 08:26 AM   Modules accepted: Orders

## 2013-08-24 LAB — OBSTETRIC PANEL
Antibody Screen: NEGATIVE
BASOS ABS: 0 10*3/uL (ref 0.0–0.1)
BASOS PCT: 0 % (ref 0–1)
Eosinophils Absolute: 0.3 10*3/uL (ref 0.0–0.7)
Eosinophils Relative: 3 % (ref 0–5)
HCT: 25.3 % — ABNORMAL LOW (ref 36.0–46.0)
HEP B S AG: NEGATIVE
Hemoglobin: 8.9 g/dL — ABNORMAL LOW (ref 12.0–15.0)
Lymphocytes Relative: 27 % (ref 12–46)
Lymphs Abs: 2.5 10*3/uL (ref 0.7–4.0)
MCH: 31.9 pg (ref 26.0–34.0)
MCHC: 35.2 g/dL (ref 30.0–36.0)
MCV: 90.7 fL (ref 78.0–100.0)
Monocytes Absolute: 0.8 10*3/uL (ref 0.1–1.0)
Monocytes Relative: 9 % (ref 3–12)
NEUTROS ABS: 5.6 10*3/uL (ref 1.7–7.7)
NEUTROS PCT: 61 % (ref 43–77)
Platelets: 203 10*3/uL (ref 150–400)
RBC: 2.79 MIL/uL — ABNORMAL LOW (ref 3.87–5.11)
RDW: 14 % (ref 11.5–15.5)
RUBELLA: 3.68 {index} — AB (ref <0.90)
Rh Type: NEGATIVE
WBC: 9.1 10*3/uL (ref 4.0–10.5)

## 2013-08-24 LAB — CULTURE, OB URINE: Colony Count: >=100000

## 2013-08-24 LAB — CYTOLOGY - PAP

## 2013-08-25 LAB — COCAINE METABOLITE (GC/LC/MS), URINE: Benzoylecgonine GC/MS Conf: 9657 ng/mL — AB (ref <100)

## 2013-08-26 ENCOUNTER — Encounter: Payer: Self-pay | Admitting: Obstetrics & Gynecology

## 2013-08-26 DIAGNOSIS — F1410 Cocaine abuse, uncomplicated: Secondary | ICD-10-CM | POA: Insufficient documentation

## 2013-08-26 LAB — PRESCRIPTION MONITORING PROFILE (19 PANEL)
Amphetamine/Meth: NEGATIVE ng/mL
BUPRENORPHINE, URINE: NEGATIVE ng/mL
Barbiturate Screen, Urine: NEGATIVE ng/mL
Benzodiazepine Screen, Urine: NEGATIVE ng/mL
CANNABINOID SCRN UR: NEGATIVE ng/mL
CREATININE, URINE: 79.67 mg/dL (ref >20.0)
Carisoprodol, Urine: NEGATIVE ng/mL
FENTANYL URINE: NEGATIVE ng/mL
MDMA URINE: NEGATIVE ng/mL
METHADONE SCREEN, URINE: NEGATIVE ng/mL
METHAQUALONE SCREEN (URINE): NEGATIVE ng/mL
Meperidine, Ur: NEGATIVE ng/mL
Nitrites, Initial: NEGATIVE ug/mL
OXYCODONE SCRN UR: NEGATIVE ng/mL
Opiate Screen, Urine: NEGATIVE ng/mL
PHENCYCLIDINE, UR: NEGATIVE ng/mL
Propoxyphene: NEGATIVE ng/mL
Tapentadol, urine: NEGATIVE ng/mL
Tramadol Scrn, Ur: NEGATIVE ng/mL
Zolpidem, Urine: NEGATIVE ng/mL
pH, Initial: 8.1 pH (ref 4.5–8.9)

## 2013-08-28 ENCOUNTER — Encounter (HOSPITAL_COMMUNITY): Payer: Medicare Other | Admitting: Anesthesiology

## 2013-08-28 ENCOUNTER — Ambulatory Visit (HOSPITAL_COMMUNITY): Payer: Medicare Other | Admitting: Anesthesiology

## 2013-08-28 MED ORDER — DOXYCYCLINE HYCLATE 100 MG IV SOLR
200.0000 mg | INTRAVENOUS | Status: DC
  Filled 2013-08-28: qty 200

## 2013-08-29 ENCOUNTER — Encounter: Payer: Self-pay | Admitting: Obstetrics & Gynecology

## 2013-08-29 ENCOUNTER — Encounter (HOSPITAL_COMMUNITY)
Admission: AD | Disposition: A | Discharge status: Home / Self Care | Payer: Self-pay | Source: Ambulatory Visit | Attending: Obstetrics & Gynecology

## 2013-08-29 ENCOUNTER — Ambulatory Visit (HOSPITAL_COMMUNITY)
Admission: AD | Admit: 2013-08-29 | Discharge: 2013-08-29 | Disposition: A | Discharge status: Home / Self Care | Payer: Medicare Other | Source: Ambulatory Visit | Attending: Obstetrics & Gynecology | Admitting: Obstetrics & Gynecology

## 2013-08-29 ENCOUNTER — Inpatient Hospital Stay (HOSPITAL_COMMUNITY): Payer: Medicare Other

## 2013-08-29 ENCOUNTER — Encounter (HOSPITAL_COMMUNITY): Payer: Self-pay | Admitting: Emergency Medicine

## 2013-08-29 ENCOUNTER — Inpatient Hospital Stay (HOSPITAL_COMMUNITY)
Admission: EM | Admit: 2013-08-29 | Discharge: 2013-08-31 | DRG: 640 | Disposition: A | Discharge status: Home / Self Care | Payer: Medicare Other | Attending: Internal Medicine | Admitting: Internal Medicine

## 2013-08-29 ENCOUNTER — Encounter (HOSPITAL_COMMUNITY): Payer: Self-pay | Admitting: *Deleted

## 2013-08-29 DIAGNOSIS — R112 Nausea with vomiting, unspecified: Secondary | ICD-10-CM | POA: Diagnosis present

## 2013-08-29 DIAGNOSIS — F32A Depression, unspecified: Secondary | ICD-10-CM

## 2013-08-29 DIAGNOSIS — F1910 Other psychoactive substance abuse, uncomplicated: Secondary | ICD-10-CM | POA: Diagnosis present

## 2013-08-29 DIAGNOSIS — Z6791 Unspecified blood type, Rh negative: Secondary | ICD-10-CM

## 2013-08-29 DIAGNOSIS — I120 Hypertensive chronic kidney disease with stage 5 chronic kidney disease or end stage renal disease: Secondary | ICD-10-CM | POA: Diagnosis present

## 2013-08-29 DIAGNOSIS — Z833 Family history of diabetes mellitus: Secondary | ICD-10-CM

## 2013-08-29 DIAGNOSIS — F1410 Cocaine abuse, uncomplicated: Secondary | ICD-10-CM

## 2013-08-29 DIAGNOSIS — E876 Hypokalemia: Secondary | ICD-10-CM

## 2013-08-29 DIAGNOSIS — N186 End stage renal disease: Secondary | ICD-10-CM | POA: Diagnosis present

## 2013-08-29 DIAGNOSIS — O021 Missed abortion: Secondary | ICD-10-CM | POA: Diagnosis present

## 2013-08-29 DIAGNOSIS — D649 Anemia, unspecified: Secondary | ICD-10-CM

## 2013-08-29 DIAGNOSIS — Z91158 Patient's noncompliance with renal dialysis for other reason: Secondary | ICD-10-CM

## 2013-08-29 DIAGNOSIS — R109 Unspecified abdominal pain: Secondary | ICD-10-CM | POA: Diagnosis present

## 2013-08-29 DIAGNOSIS — N2581 Secondary hyperparathyroidism of renal origin: Secondary | ICD-10-CM | POA: Diagnosis present

## 2013-08-29 DIAGNOSIS — Z8249 Family history of ischemic heart disease and other diseases of the circulatory system: Secondary | ICD-10-CM

## 2013-08-29 DIAGNOSIS — Z888 Allergy status to other drugs, medicaments and biological substances status: Secondary | ICD-10-CM

## 2013-08-29 DIAGNOSIS — O26839 Pregnancy related renal disease, unspecified trimester: Secondary | ICD-10-CM

## 2013-08-29 DIAGNOSIS — F329 Major depressive disorder, single episode, unspecified: Secondary | ICD-10-CM

## 2013-08-29 DIAGNOSIS — Z885 Allergy status to narcotic agent status: Secondary | ICD-10-CM

## 2013-08-29 DIAGNOSIS — E875 Hyperkalemia: Principal | ICD-10-CM | POA: Diagnosis present

## 2013-08-29 DIAGNOSIS — Z9115 Patient's noncompliance with renal dialysis: Secondary | ICD-10-CM

## 2013-08-29 DIAGNOSIS — O26899 Other specified pregnancy related conditions, unspecified trimester: Secondary | ICD-10-CM

## 2013-08-29 DIAGNOSIS — Z992 Dependence on renal dialysis: Secondary | ICD-10-CM

## 2013-08-29 HISTORY — DX: Disorder of kidney and ureter, unspecified: N28.9

## 2013-08-29 LAB — COMPREHENSIVE METABOLIC PANEL
ALBUMIN: 3.2 g/dL — AB (ref 3.5–5.2)
ALK PHOS: 38 U/L — AB (ref 39–117)
ALT: 6 U/L (ref 0–35)
AST: 6 U/L (ref 0–37)
BUN: 83 mg/dL — ABNORMAL HIGH (ref 6–23)
CO2: 20 mEq/L (ref 19–32)
Calcium: 9.3 mg/dL (ref 8.4–10.5)
Chloride: 92 mEq/L — ABNORMAL LOW (ref 96–112)
Creatinine, Ser: 19.69 mg/dL — ABNORMAL HIGH (ref 0.50–1.10)
GFR calc Af Amer: 2 mL/min — ABNORMAL LOW (ref >90)
GFR calc non Af Amer: 2 mL/min — ABNORMAL LOW (ref >90)
Glucose, Bld: 86 mg/dL (ref 70–99)
POTASSIUM: 6.8 meq/L — AB (ref 3.7–5.3)
Sodium: 134 mEq/L — ABNORMAL LOW (ref 137–147)
Total Bilirubin: 0.3 mg/dL (ref 0.3–1.2)
Total Protein: 7.5 g/dL (ref 6.0–8.3)

## 2013-08-29 LAB — I-STAT CHEM 8, ED
BUN: 99 mg/dL — ABNORMAL HIGH (ref 6–23)
CHLORIDE: 100 meq/L (ref 96–112)
Calcium, Ion: 1.07 mmol/L — ABNORMAL LOW (ref 1.12–1.32)
GLUCOSE: 76 mg/dL (ref 70–99)
HCT: 23 % — ABNORMAL LOW (ref 36.0–46.0)
Hemoglobin: 7.8 g/dL — ABNORMAL LOW (ref 12.0–15.0)
POTASSIUM: 7.4 meq/L — AB (ref 3.7–5.3)
Sodium: 133 mEq/L — ABNORMAL LOW (ref 137–147)
TCO2: 20 mmol/L (ref 0–100)

## 2013-08-29 LAB — CBC WITH DIFFERENTIAL/PLATELET
Basophils Absolute: 0 10*3/uL (ref 0.0–0.1)
Basophils Relative: 0 % (ref 0–1)
Eosinophils Absolute: 0.3 10*3/uL (ref 0.0–0.7)
Eosinophils Relative: 2 % (ref 0–5)
HEMATOCRIT: 26.6 % — AB (ref 36.0–46.0)
HEMOGLOBIN: 8.8 g/dL — AB (ref 12.0–15.0)
LYMPHS ABS: 2.8 10*3/uL (ref 0.7–4.0)
Lymphocytes Relative: 23 % (ref 12–46)
MCH: 31.3 pg (ref 26.0–34.0)
MCHC: 33.1 g/dL (ref 30.0–36.0)
MCV: 94.7 fL (ref 78.0–100.0)
MONOS PCT: 5 % (ref 3–12)
Monocytes Absolute: 0.6 10*3/uL (ref 0.1–1.0)
NEUTROS ABS: 8.8 10*3/uL — AB (ref 1.7–7.7)
NEUTROS PCT: 70 % (ref 43–77)
Platelets: 208 10*3/uL (ref 150–400)
RBC: 2.81 MIL/uL — ABNORMAL LOW (ref 3.87–5.11)
RDW: 13.3 % (ref 11.5–15.5)
WBC: 12.5 10*3/uL — ABNORMAL HIGH (ref 4.0–10.5)

## 2013-08-29 LAB — RENAL FUNCTION PANEL
Albumin: 3 g/dL — ABNORMAL LOW (ref 3.5–5.2)
BUN: 81 mg/dL — ABNORMAL HIGH (ref 6–23)
CALCIUM: 8.9 mg/dL (ref 8.4–10.5)
CO2: 17 meq/L — AB (ref 19–32)
Chloride: 91 mEq/L — ABNORMAL LOW (ref 96–112)
Creatinine, Ser: 17.76 mg/dL — ABNORMAL HIGH (ref 0.50–1.10)
GFR, EST AFRICAN AMERICAN: 3 mL/min — AB (ref >90)
GFR, EST NON AFRICAN AMERICAN: 2 mL/min — AB (ref >90)
Glucose, Bld: 121 mg/dL — ABNORMAL HIGH (ref 70–99)
Phosphorus: 6.6 mg/dL — ABNORMAL HIGH (ref 2.3–4.6)
Potassium: 4.5 mEq/L (ref 3.7–5.3)
Sodium: 136 mEq/L — ABNORMAL LOW (ref 137–147)

## 2013-08-29 LAB — HEPATIC FUNCTION PANEL
ALT: 6 U/L (ref 0–35)
AST: 7 U/L (ref 0–37)
Albumin: 3.4 g/dL — ABNORMAL LOW (ref 3.5–5.2)
Alkaline Phosphatase: 40 U/L (ref 39–117)
TOTAL PROTEIN: 7.5 g/dL (ref 6.0–8.3)
Total Bilirubin: 0.3 mg/dL (ref 0.3–1.2)

## 2013-08-29 LAB — TYPE AND SCREEN
ABO/RH(D): B NEG
ANTIBODY SCREEN: NEGATIVE

## 2013-08-29 LAB — CBC
HEMATOCRIT: 24 % — AB (ref 36.0–46.0)
HEMATOCRIT: 21.5 % — AB (ref 36.0–46.0)
HEMOGLOBIN: 7.9 g/dL — AB (ref 12.0–15.0)
HEMOGLOBIN: 7.2 g/dL — AB (ref 12.0–15.0)
MCH: 31.5 pg (ref 26.0–34.0)
MCH: 31.9 pg (ref 26.0–34.0)
MCHC: 32.9 g/dL (ref 30.0–36.0)
MCHC: 33.5 g/dL (ref 30.0–36.0)
MCV: 95.6 fL (ref 78.0–100.0)
MCV: 95.1 fL (ref 78.0–100.0)
Platelets: 215 10*3/uL (ref 150–400)
Platelets: 171 10*3/uL (ref 150–400)
RBC: 2.51 MIL/uL — AB (ref 3.87–5.11)
RBC: 2.26 MIL/uL — ABNORMAL LOW (ref 3.87–5.11)
RDW: 13.5 % (ref 11.5–15.5)
RDW: 13.4 % (ref 11.5–15.5)
WBC: 13.7 10*3/uL — AB (ref 4.0–10.5)
WBC: 10.5 10*3/uL (ref 4.0–10.5)

## 2013-08-29 LAB — BASIC METABOLIC PANEL
BUN: 87 mg/dL — AB (ref 6–23)
CO2: 19 mEq/L (ref 19–32)
Calcium: 9.1 mg/dL (ref 8.4–10.5)
Chloride: 92 mEq/L — ABNORMAL LOW (ref 96–112)
Creatinine, Ser: 19.63 mg/dL — ABNORMAL HIGH (ref 0.50–1.10)
GFR calc Af Amer: 2 mL/min — ABNORMAL LOW (ref >90)
GFR, EST NON AFRICAN AMERICAN: 2 mL/min — AB (ref >90)
Glucose, Bld: 77 mg/dL (ref 70–99)
POTASSIUM: 7.7 meq/L — AB (ref 3.7–5.3)
Sodium: 135 mEq/L — ABNORMAL LOW (ref 137–147)

## 2013-08-29 LAB — RAPID URINE DRUG SCREEN, HOSP PERFORMED
Amphetamines: NOT DETECTED
BARBITURATES: NOT DETECTED
Benzodiazepines: NOT DETECTED
Cocaine: POSITIVE — AB
OPIATES: NOT DETECTED
TETRAHYDROCANNABINOL: NOT DETECTED

## 2013-08-29 LAB — CREATININE, SERUM
CREATININE: 18.4 mg/dL — AB (ref 0.50–1.10)
GFR, EST AFRICAN AMERICAN: 2 mL/min — AB (ref >90)
GFR, EST NON AFRICAN AMERICAN: 2 mL/min — AB (ref >90)

## 2013-08-29 LAB — LIPASE, BLOOD: Lipase: 51 U/L (ref 11–59)

## 2013-08-29 LAB — CBG MONITORING, ED
GLUCOSE-CAPILLARY: 161 mg/dL — AB (ref 70–99)
Glucose-Capillary: 72 mg/dL (ref 70–99)

## 2013-08-29 SURGERY — DILATION AND EVACUATION, UTERUS
Anesthesia: Monitor Anesthesia Care

## 2013-08-29 MED ORDER — PENTAFLUOROPROP-TETRAFLUOROETH EX AERO: 1.0000 "application " | INHALATION_SPRAY | CUTANEOUS | Status: DC | PRN

## 2013-08-29 MED ORDER — SODIUM CHLORIDE 0.9 % IV SOLN
100.0000 mL | INTRAVENOUS | Status: DC | PRN
Start: 2013-08-29 — End: 2013-08-30

## 2013-08-29 MED ORDER — ALTEPLASE 2 MG IJ SOLR
2.0000 mg | Freq: Once | INTRAMUSCULAR | Status: AC | PRN
  Filled 2013-08-29: qty 2

## 2013-08-29 MED ORDER — HYDROMORPHONE HCL PF 1 MG/ML IJ SOLN
1.0000 mg | INTRAMUSCULAR | Status: DC | PRN
  Administered 2013-08-29 – 2013-08-31 (×9): 1 mg via INTRAVENOUS
  Filled 2013-08-29 (×6): qty 1

## 2013-08-29 MED ORDER — CINACALCET HCL 30 MG PO TABS
60.0000 mg | ORAL_TABLET | Freq: Every day | ORAL | Status: DC
  Filled 2013-08-29 (×3): qty 2

## 2013-08-29 MED ORDER — CALCIUM GLUCONATE 10 % IV SOLN
1.0000 g | Freq: Once | INTRAVENOUS | Status: AC
  Administered 2013-08-29: 1 g via INTRAVENOUS
  Filled 2013-08-29: qty 10

## 2013-08-29 MED ORDER — ACETAMINOPHEN 650 MG RE SUPP: 650.0000 mg | Freq: Four times a day (QID) | RECTAL | Status: DC | PRN

## 2013-08-29 MED ORDER — SODIUM CHLORIDE 0.9 % IJ SOLN: 3.0000 mL | INTRAMUSCULAR | Status: DC | PRN

## 2013-08-29 MED ORDER — LIDOCAINE-PRILOCAINE 2.5-2.5 % EX CREA: 1.0000 "application " | TOPICAL_CREAM | CUTANEOUS | Status: DC | PRN

## 2013-08-29 MED ORDER — PANTOPRAZOLE SODIUM 40 MG PO TBEC
40.0000 mg | DELAYED_RELEASE_TABLET | Freq: Every day | ORAL | Status: DC
  Administered 2013-08-30: 40 mg via ORAL
  Filled 2013-08-29 (×2): qty 1

## 2013-08-29 MED ORDER — SODIUM CHLORIDE 0.9 % IV SOLN: 100.0000 mL | INTRAVENOUS | Status: DC | PRN

## 2013-08-29 MED ORDER — HEPARIN SODIUM (PORCINE) 5000 UNIT/ML IJ SOLN
5000.0000 [IU] | Freq: Three times a day (TID) | INTRAMUSCULAR | Status: DC
  Administered 2013-08-30 (×3): 5000 [IU] via SUBCUTANEOUS
  Filled 2013-08-29 (×7): qty 1

## 2013-08-29 MED ORDER — SODIUM CHLORIDE 0.9 % IJ SOLN
3.0000 mL | Freq: Two times a day (BID) | INTRAMUSCULAR | Status: DC
Start: 2013-08-29 — End: 2013-08-31
  Administered 2013-08-29 – 2013-08-30 (×3): 3 mL via INTRAVENOUS

## 2013-08-29 MED ORDER — BISACODYL 10 MG RE SUPP
10.0000 mg | Freq: Every day | RECTAL | Status: DC | PRN
Start: 2013-08-29 — End: 2013-08-31

## 2013-08-29 MED ORDER — GABAPENTIN 100 MG PO CAPS
200.0000 mg | ORAL_CAPSULE | Freq: Every day | ORAL | Status: DC
  Administered 2013-08-30 (×2): 200 mg via ORAL
  Filled 2013-08-29 (×3): qty 2

## 2013-08-29 MED ORDER — ONDANSETRON HCL 4 MG/2ML IJ SOLN
4.0000 mg | Freq: Four times a day (QID) | INTRAMUSCULAR | Status: DC | PRN
  Administered 2013-08-30 (×2): 4 mg via INTRAVENOUS
  Filled 2013-08-29 (×2): qty 2

## 2013-08-29 MED ORDER — HEPARIN SODIUM (PORCINE) 1000 UNIT/ML DIALYSIS: 1000.0000 [IU] | INTRAMUSCULAR | Status: DC | PRN

## 2013-08-29 MED ORDER — LACTATED RINGERS IV SOLN
INTRAVENOUS | Status: DC
  Administered 2013-08-29: 12:00:00 via INTRAVENOUS

## 2013-08-29 MED ORDER — INSULIN ASPART 100 UNIT/ML IV SOLN: 10.0000 [IU] | Freq: Once | INTRAVENOUS | Status: DC

## 2013-08-29 MED ORDER — HYDROMORPHONE HCL PF 1 MG/ML IJ SOLN
0.5000 mg | Freq: Once | INTRAMUSCULAR | Status: AC
  Administered 2013-08-29: 0.5 mg via INTRAVENOUS
  Filled 2013-08-29: qty 1

## 2013-08-29 MED ORDER — SODIUM CHLORIDE 0.9 % IV SOLN: 1.0000 g | Freq: Once | INTRAVENOUS | Status: DC

## 2013-08-29 MED ORDER — ONDANSETRON HCL 4 MG PO TABS: 4.0000 mg | ORAL_TABLET | Freq: Four times a day (QID) | ORAL | Status: DC | PRN

## 2013-08-29 MED ORDER — OXYCODONE-ACETAMINOPHEN 5-325 MG PO TABS
1.0000 | ORAL_TABLET | Freq: Once | ORAL | Status: AC
  Administered 2013-08-29: 1 via ORAL

## 2013-08-29 MED ORDER — LIDOCAINE HCL (PF) 1 % IJ SOLN: 5.0000 mL | INTRAMUSCULAR | Status: DC | PRN

## 2013-08-29 MED ORDER — IOHEXOL 300 MG/ML  SOLN
80.0000 mL | Freq: Once | INTRAMUSCULAR | Status: AC | PRN
  Administered 2013-08-29: 80 mL via INTRAVENOUS

## 2013-08-29 MED ORDER — INSULIN ASPART 100 UNIT/ML ~~LOC~~ SOLN
10.0000 [IU] | Freq: Once | SUBCUTANEOUS | Status: AC
  Administered 2013-08-29: 10 [IU] via INTRAVENOUS
  Filled 2013-08-29: qty 1

## 2013-08-29 MED ORDER — CALCIUM ACETATE 667 MG PO CAPS
2668.0000 mg | ORAL_CAPSULE | Freq: Three times a day (TID) | ORAL | Status: DC
  Administered 2013-08-30: 2668 mg via ORAL
  Filled 2013-08-29 (×7): qty 4

## 2013-08-29 MED ORDER — ALBUTEROL SULFATE (2.5 MG/3ML) 0.083% IN NEBU: 3.0000 mL | INHALATION_SOLUTION | Freq: Four times a day (QID) | RESPIRATORY_TRACT | Status: DC | PRN

## 2013-08-29 MED ORDER — OXYCODONE-ACETAMINOPHEN 5-325 MG PO TABS
ORAL_TABLET | ORAL | Status: AC
  Filled 2013-08-29: qty 1

## 2013-08-29 MED ORDER — LACTATED RINGERS IV SOLN: INTRAVENOUS | Status: DC

## 2013-08-29 MED ORDER — LANTHANUM CARBONATE 500 MG PO CHEW
1000.0000 mg | CHEWABLE_TABLET | Freq: Two times a day (BID) | ORAL | Status: DC
  Filled 2013-08-29 (×5): qty 2

## 2013-08-29 MED ORDER — ALUM & MAG HYDROXIDE-SIMETH 200-200-20 MG/5ML PO SUSP: 30.0000 mL | Freq: Four times a day (QID) | ORAL | Status: DC | PRN

## 2013-08-29 MED ORDER — DEXTROSE 50 % IV SOLN
1.0000 | Freq: Once | INTRAVENOUS | Status: AC
  Administered 2013-08-29: 50 mL via INTRAVENOUS
  Filled 2013-08-29: qty 50

## 2013-08-29 MED ORDER — SODIUM CHLORIDE 0.9 % IJ SOLN: 3.0000 mL | Freq: Two times a day (BID) | INTRAMUSCULAR | Status: DC

## 2013-08-29 MED ORDER — ACETAMINOPHEN 325 MG PO TABS
650.0000 mg | ORAL_TABLET | Freq: Four times a day (QID) | ORAL | Status: DC | PRN
  Filled 2013-08-29: qty 2

## 2013-08-29 MED ORDER — ALBUTEROL SULFATE (2.5 MG/3ML) 0.083% IN NEBU
10.0000 mg | INHALATION_SOLUTION | Freq: Once | RESPIRATORY_TRACT | Status: AC
  Administered 2013-08-29: 10 mg via RESPIRATORY_TRACT
  Filled 2013-08-29: qty 12

## 2013-08-29 MED ORDER — NEPRO/CARBSTEADY PO LIQD: 237.0000 mL | ORAL | Status: DC | PRN

## 2013-08-29 MED ORDER — SODIUM CHLORIDE 0.9 % IV SOLN: 250.0000 mL | INTRAVENOUS | Status: DC | PRN

## 2013-08-29 SURGICAL SUPPLY — 19 items
CATH ROBINSON RED A/P 16FR (CATHETERS) IMPLANT
CLOTH BEACON ORANGE TIMEOUT ST (SAFETY) IMPLANT
DECANTER SPIKE VIAL GLASS SM (MISCELLANEOUS) IMPLANT
GLOVE BIOGEL PI IND STRL 7.0 (GLOVE) IMPLANT
GLOVE BIOGEL PI INDICATOR 7.0 (GLOVE)
GLOVE ECLIPSE 7.0 STRL STRAW (GLOVE) IMPLANT
GOWN STRL REUS W/TWL LRG LVL3 (GOWN DISPOSABLE) IMPLANT
KIT BERKELEY 1ST TRIMESTER 3/8 (MISCELLANEOUS) IMPLANT
NS IRRIG 1000ML POUR BTL (IV SOLUTION) IMPLANT
PACK VAGINAL MINOR WOMEN LF (CUSTOM PROCEDURE TRAY) IMPLANT
PAD OB MATERNITY 4.3X12.25 (PERSONAL CARE ITEMS) IMPLANT
PAD PREP 24X48 CUFFED NSTRL (MISCELLANEOUS) IMPLANT
SET BERKELEY SUCTION TUBING (SUCTIONS) IMPLANT
TOWEL OR 17X24 6PK STRL BLUE (TOWEL DISPOSABLE) IMPLANT
VACURETTE 10 RIGID CVD (CANNULA) IMPLANT
VACURETTE 6 ASPIR F TIP BERK (CANNULA) IMPLANT
VACURETTE 7MM CVD STRL WRAP (CANNULA) IMPLANT
VACURETTE 8 RIGID CVD (CANNULA) IMPLANT
VACURETTE 9 RIGID CVD (CANNULA) IMPLANT

## 2013-08-29 NOTE — ED Notes (Signed)
Santiago Glad, RN to escort patient to CT scan and then to dialysis

## 2013-08-29 NOTE — Progress Notes (Addendum)
Faculty Practice OB/GYN Attending Note  Desiree [name] is a 38 y.o. [phone] with recently diagnosed fetal demise (missed abortion) around [redacted] weeks GA, in the setting of ESRD on hemodialysis. Given her ESRD, she was scheduled for D&E today knowing that she was getting hemodialysis yesterday; she is on M-W-F schedule.  On encounter today in the preoperative area, she reports that she did not get dialyzed yesterday secondary to diffuse abdominal pain.  No bleeding and no other symptoms attributable to her missed abortion at 7 weeks.  On exam, she is moaning, and groaning and diffuse all over abdomen.  BP 133/74  Pulse 97  Temp(Src) 99 F (37.2 C) (Oral)  Resp 16  SpO2 99%.  Labs are below  Results for orders placed during the hospital encounter of 08/29/13 (from the past 24 hour(s))  CBC     Status: Abnormal   Collection Time    08/29/13 12:10 PM      Result Value Ref Range   WBC 13.7 (*) 4.0 - 10.5 K/uL   RBC 2.51 (*) 3.87 - 5.11 MIL/uL   Hemoglobin 7.9 (*) 12.0 - 15.0 g/dL   HCT 24.0 (*) 36.0 - 46.0 %   MCV 95.6  78.0 - 100.0 fL   MCH 31.5  26.0 - 34.0 pg   MCHC 32.9  30.0 - 36.0 g/dL   RDW 13.5  11.5 - 15.5 %   Platelets 215  150 - 400 K/uL  TYPE AND SCREEN     Status: None   Collection Time    08/29/13 12:10 PM      Result Value Ref Range   ABO/RH(D) B NEG     Antibody Screen NEG     Sample Expiration 09/01/2013    COMPREHENSIVE METABOLIC PANEL     Status: Abnormal   Collection Time    08/29/13 12:10 PM      Result Value Ref Range   Sodium 134 (*) 137 - 147 mEq/L   Potassium 6.8 (*) 3.7 - 5.3 mEq/L   Chloride 92 (*) 96 - 112 mEq/L   CO2 20  19 - 32 mEq/L   Glucose, Bld 86  70 - 99 mg/dL   BUN 83 (*) 6 - 23 mg/dL   Creatinine, Ser 19.69 (*) 0.50 - 1.10 mg/dL   Calcium 9.3  8.4 - 10.5 mg/dL   Total Protein 7.5  6.0 - 8.3 g/dL   Albumin 3.2 (*) 3.5 - 5.2 g/dL   AST 6  0 - 37 U/L   ALT 6  0 - 35 U/L   Alkaline Phosphatase 38 (*) 39 - 117 U/L   Total Bilirubin 0.3  0.3 -  1.2 mg/dL   GFR calc non Af Amer 2 (*) >90 mL/min   GFR calc Af Amer 2 (*) >90 mL/min   Given elevated K and Cr; patient needs evaluation and possibly urgent dialysis. She was instructed to go to [facility] ED.  After she gets her dialysis and is stable, will reschedule her D&E; this is not urgent.  Her pain and symptoms are not as a result of her 7 week MAB; she needs to be stabilized before this matter is addressed.  Furthermore, our Anesthesiology team feels it is not prudent to proceed with an elective procedure at this time given her situation.  Will follow up with patient.  Plan discussed with her, her husband and daughter. She was discharged from [facility] and instructed to go to [facility] ED now.  UGONNA  Harolyn Rutherford, MD, [place] Attending [place], [facility]

## 2013-08-29 NOTE — ED Notes (Addendum)
i-stat chem 8 result given to Dr. Eulis Foster

## 2013-08-29 NOTE — ED Provider Notes (Addendum)
  Face-to-face evaluation   History: She presents for evaluation of abdominal pain, and high potassium. She presented to another facility for D&E today, but was sent here for hyperkalemia.  Physical exam: Alert , calm, cooperative. Abdomen- tender suprapubic, moderate.   CRITICAL CARE Performed by: Richarda Blade Total critical care time: 35 minutes Critical care time was exclusive of separately billable procedures and treating other patients. Critical care was necessary to treat or prevent imminent or life-threatening deterioration. Critical care was time spent personally by me on the following activities: development of treatment plan with patient and/or surrogate as well as nursing, discussions with consultants, evaluation of patient's response to treatment, examination of patient, obtaining history from patient or surrogate, ordering and performing treatments and interventions, ordering and review of laboratory studies, ordering and review of radiographic studies, pulse oximetry and re-evaluation of patient's condition.  Medical screening examination/treatment/procedure(s) were conducted as a shared visit with non-physician practitioner(s) and myself.  I personally evaluated the patient during the encounter  Richarda Blade, MD 09/06/13 [id number]  Richarda Blade, MD 09/06/13 [phone]

## 2013-08-29 NOTE — H&P (Signed)
[facility] History and Physical  [name] [id number] DOB: 1975-11-24 DOA: 08/29/2013  Referring physician:  PCP: Windy Kalata, MD   Chief Complaint: Abdominal pain/nausea vomiting  HPI: Desiree [name] is a 38 y.o. female with a past medical history of end-stage renal disease, currently on hemodialysis on Mondays Wednesdays and Fridays, history of noncompliance to her dialysis sessions, polysubstance abuse,  presented to the emergency department with complaints of a total, nausea, vomiting. She was seen earlier in the day at the [facility] by Dr Harolyn Rutherford  as if she was scheduled for D&E, recently diagnosed with missed abortion around 7 weeks. Lab work revealed a potassium of 6.8. she missed her hemodialysis yesterday complaining of diffuse abdominal discomfort. She had also missed her dialysis last Wednesday. she also complains of diffuse abdominal pain that started last Friday, characterized as crampy, associated with multiple episodes of nausea vomiting and diarrhea. she denies fevers, chills, chest pain, but red blood per rectum, hematemesis, melena.   With regard to missed abortion Dr Harolyn Rutherford felt  D&E was not urgent and that currant Renal and GI issues should be addressed first. Patient denies vaginal bleed neither does she appear to have significant pelvic pain.                                                                                                                                              Review of Systems:  Constitutional:  No weight loss, night sweats, Fevers, chills, fatigue.  HEENT:  No headaches, Difficulty swallowing,Tooth/dental problems,Sore throat,  No sneezing, itching, ear ache, nasal congestion, post nasal drip,  Cardio-vascular:  No chest pain, Orthopnea, PND, swelling in lower extremities, anasarca, dizziness, palpitations  GI:  No heartburn, indigestion, abdominal pain, nausea, vomiting, diarrhea, change in bowel habits, loss of  appetite  Resp:  Positive for shortness of breath with exertion or at rest. Denies excess mucus, no productive cough, No non-productive cough, No coughing up of blood.No change in color of mucus.No wheezing.No chest wall deformity  Skin:  no rash or lesions.  GU:  no dysuria, change in color of urine, no urgency or frequency. No flank pain.  Musculoskeletal:  No joint pain or swelling. No decreased range of motion. No back pain.  Psych:  No change in mood or affect. No depression or anxiety. No memory loss.   Past Medical History  Diagnosis Date  . Blood transfusion   . Preterm labor   . Dialysis patient   . Chronic kidney disease   . Hypertension   . Asthma     as a child  . Headache(784.0)   . GERD (gastroesophageal reflux disease)     nexium prn   Past Surgical History  Procedure Laterality Date  . Renal biopsy    . Dilation and curettage of uterus    . Dilation and evacuation  08/02/2011  Procedure: DILATATION AND EVACUATION;  Surgeon: Logan Bores, MD;  Location: [facility];  Service: Gynecology;;  . Av fistula placement Left 2010  . Revision of arteriovenous goretex graft Left 02/11/2013    Procedure: REVISION OF ARTERIOVENOUS GORTEX FISTULA;  Surgeon: Rosetta Posner, MD;  Location: [place];  Service: Vascular;  Laterality: Left;   Social History:  reports that she has been passively smoking.  She has never used smokeless tobacco. She reports that she uses illicit drugs (Marijuana). She reports that she does not drink alcohol.  Allergies  Allergen Reactions  . Morphine And Related Anaphylaxis    Patient says she stops breathing Can take percocet  . Prednisone Other (See Comments)    Patient says prednisone causes her to cramp all over, muscle spasms uncontrolled    Family History  Problem Relation Age of Onset  . Hypertension    . Diabetes type II    . Diabetes Mother   . Hyperlipidemia Mother   . Hypertension Mother   . Heart disease Mother      Prior to  Admission medications   Medication Sig Start Date End Date Taking? Authorizing Chanika Byland  acetaminophen (TYLENOL) 325 MG tablet Take 650 mg by mouth every 6 (six) hours as needed for mild pain or moderate pain.     Historical Emmalou Hunger, MD  albuterol (PROVENTIL HFA;VENTOLIN HFA) 108 (90 BASE) MCG/ACT inhaler Inhale 1-2 puffs into the lungs every 6 (six) hours as needed for wheezing or shortness of breath.    Historical Aedon Deason, MD  calcium acetate (PHOSLO) 667 MG capsule Take 2,668 mg by mouth 3 (three) times daily with meals.     Historical Joquan Lotz, MD  cinacalcet (SENSIPAR) 60 MG tablet Take 60 mg by mouth daily.    Historical Cameren Earnest, MD  diphenhydrAMINE (BENADRYL) 25 MG tablet Take 25 mg by mouth every 6 (six) hours as needed for allergies or sleep.     Historical Mat Stuard, MD  esomeprazole (NEXIUM) 40 MG capsule Take 40 mg by mouth daily as needed (heart burn).     Historical Shrinika Blatz, MD  fluconazole (DIFLUCAN) 150 MG tablet Take 1 tablet (150 mg total) by mouth once. Can repeat dose three days later if no relief noted 08/22/13   Osborne Oman, MD  gabapentin (NEURONTIN) 100 MG capsule Take 200 mg by mouth at bedtime.    Historical Nyaja Dubuque, MD  lanthanum (FOSRENOL) 1000 MG chewable tablet Chew 1,000 mg by mouth 2 (two) times daily with a meal.     Historical Ludivina Guymon, MD  metroNIDAZOLE (FLAGYL) 500 MG tablet Take 1 tablet (500 mg total) by mouth 2 (two) times daily. 08/23/13 08/30/13  Osborne Oman, MD  Prenatal Multivit-Min-Fe-FA (PRENATAL VITAMINS) 0.8 MG tablet Take 1 tablet by mouth daily. 08/22/13   Osborne Oman, MD  promethazine (PHENERGAN) 25 MG tablet Take 1 tablet (25 mg total) by mouth every 6 (six) hours as needed for nausea or vomiting. 08/22/13   Osborne Oman, MD  zolpidem (AMBIEN) 5 MG tablet Take 1-2 tablets (5-10 mg total) by mouth at bedtime as needed for sleep. 08/22/13   Osborne Oman, MD   Physical Exam: Filed Vitals:   08/29/13 1615  BP:   Pulse: 95  Temp:     Resp: 18    BP 126/70  Pulse 95  Temp(Src) 99.3 F (37.4 C) (Oral)  Resp 18  Ht 5\' 5"  (1.651 m)  Wt 82.7 kg (182 lb 5.1 oz)  BMI 30.34 kg/m2  SpO2 100%  LMP 07/14/2013  General:  In mild distress, appears to be uncomfortable, complaining of abdominal pain Eyes: PERRL, normal lids, irises & conjunctiva ENT: grossly normal hearing, lips & tongue Neck: no LAD, masses or thyromegaly Cardiovascular: Tachycardic, RRR, no m/r/g. No LE edema. Telemetry: SR, no arrhythmias  Respiratory: CTA bilaterally, no w/r/r. Normal respiratory effort. Abdomen: soft, has generalized pain to palpation, no guarding or rebound tenderness Skin: no rash or induration seen on limited exam Musculoskeletal: grossly normal tone BUE/BLE Psychiatric: grossly normal mood and affect, speech fluent and appropriate Neurologic: grossly non-focal.          Labs on Admission:  Basic Metabolic Panel:  Recent Labs Lab 08/29/13 1210 08/29/13 1532 08/29/13 1543  NA 134* 135* 133*  K 6.8* 7.7* 7.4*  CL 92* 92* 100  CO2 20 19  --   GLUCOSE 86 77 76  BUN 83* 87* 99*  CREATININE 19.69* 19.63* >18.00*  CALCIUM 9.3 9.1  --    Liver Function Tests:  Recent Labs Lab 08/29/13 1210 08/29/13 1532  AST 6 7  ALT 6 6  ALKPHOS 38* 40  BILITOT 0.3 0.3  PROT 7.5 7.5  ALBUMIN 3.2* 3.4*   No results found for this basename: LIPASE, AMYLASE,  in the last 168 hours No results found for this basename: AMMONIA,  in the last 168 hours CBC:  Recent Labs Lab 08/29/13 1210 08/29/13 1532 08/29/13 1543  WBC 13.7* 12.5*  --   NEUTROABS  --  8.8*  --   HGB 7.9* 8.8* 7.8*  HCT 24.0* 26.6* 23.0*  MCV 95.6 94.7  --   PLT 215 208  --    Cardiac Enzymes: No results found for this basename: CKTOTAL, CKMB, CKMBINDEX, TROPONINI,  in the last 168 hours  BNP (last 3 results) No results found for this basename: PROBNP,  in the last 8760 hours CBG: No results found for this basename: GLUCAP,  in the last 168  hours  Radiological Exams on Admission: No results found.  EKG: Independently reviewed.   Assessment/Plan Principal Problem:   ESRD (end stage renal disease) Active Problems:   Hyperkalemia   Abdominal pain   Nausea & vomiting   Missed abortion   1. Hyperkalemia. Patient with history of end-stage renal disease, likely secondary to missing hemodialysis. Nephrology consulted, plan for patient to undergo urgent hemodialysis this evening. EKG did reveal mild peaked T waves. She was given calcium gluconate in the emergency room.  2. End-stage renal disease. Patient with history of noncompliance to hemodialysis, as she missed yesterday's hemodialysis session as well as last Wednesdays. Nephrology consulted, plan for her to undergo urgent hemodialysis this evening. Patient was counseled on the importance of attending all dialysis sessions.  3. Abdominal pain. Patient complaining of abdominal pain since last Friday, associated with nausea, vomiting and diarrhea. Could be infectious or related to missing hemodialysis. Initial lab showed a white count of 12,500. Will check a lipase level, stool studies, and obtain a CT scan of abdomen and pelvis for further evaluation. Provide as needed antiemetics and narcotics for nausea and pain.  4. Missed abortion. Patient recently diagnosed with missed abortion around 7 weeks, for which she saw Dr Harolyn Rutherford of OB/GYN today for scheduled D&E. She recommended that a renal and GI issues be addressed and that D&E should be rescheduled until she stabilizes as this was not urgent.  5. History of hypertension. Blood pressures in the emergency room stable, having systolic blood pressures in the 120s  to 130s will monitor 6. DVT prophylaxis. Subcutaneous heparin    Code Status: Full Family Communication: Spoke with her husband at bedside Disposition Plan: Anticipate she will require greater than 2 nights hospitalization  Time spent: 70 min  Kelvin Cellar [facility] Pager [phone]  **Disclaimer: This note may have been dictated with voice recognition software. Similar sounding words can inadvertently be transcribed and this note may contain transcription errors which may not have been corrected upon publication of note.**

## 2013-08-29 NOTE — Anesthesia Preprocedure Evaluation (Deleted)
Anesthesia Evaluation  Patient identified by MRN, date of birth, ID band Patient awake    Reviewed: Allergy & Precautions, H&P , Patient's Chart, lab work & pertinent test results, reviewed documented beta blocker date and time   History of Anesthesia Complications Negative for: history of anesthetic complications  Airway Mallampati: II TM Distance: >3 FB Neck ROM: full    Dental   Pulmonary asthma ,  breath sounds clear to auscultation        Cardiovascular Exercise Tolerance: Good hypertension, Rhythm:regular Rate:Normal     Neuro/Psych  Headaches, negative psych ROS   GI/Hepatic GERD-  Controlled,  Endo/Other    Renal/GU ESRFRenal disease     Musculoskeletal   Abdominal   Peds  Hematology  (+) anemia ,   Anesthesia Other Findings   Reproductive/Obstetrics                          Anesthesia Physical Anesthesia Plan  ASA: III  Anesthesia Plan: MAC   Post-op Pain Management:    Induction:   Airway Management Planned:   Additional Equipment:   Intra-op Plan:   Post-operative Plan:   Informed Consent: I have reviewed the patients History and Physical, chart, labs and discussed the procedure including the risks, benefits and alternatives for the proposed anesthesia with the patient or authorized representative who has indicated his/her understanding and acceptance.   Dental Advisory Given  Plan Discussed with: CRNA, Surgeon and Anesthesiologist  Anesthesia Plan Comments:        Patient skipped dialysis yesterday.  CMET shows hyperkalemia and her most recent UDS is positive.  Given the elective nature of her case and discussion with her OB Dr., we jointly decided that proceeding with surgery at this time would put her at increased risk of adverse outcomes.  We will reschedule and reiterate the importance of following through with her medical recommendations including appropriate  dialysis and abstaining from drug use. Anesthesia Quick Evaluation

## 2013-08-29 NOTE — Consult Note (Signed)
[facility] Renal Consultation Note  Indication for Consultation:  Management of ESRD/hemodialysis; anemia, hypertension/volume and secondary hyperparathyroidism  HPI: Desiree [name] is a 38 y.o. female with a history of ESRD on dialysis at the [facility] and recently diagnosed fetal demise (previously [id number]) who was scheduled today for D&E at [facility], but was found to have elevated potassium and sent ot [facility] for urgent dialysis.  She presented for her initial prenatal visit with the [facility] on 6/9, at which time seven-week ultrasound showed no cardiac activity and was scheduled for D&E today, but developed severe abdominal pain, nausea, vomiting, and diarrhea over the last four days, preventing her from attending her scheduled dialysis yesterday.  As a result, her potassium today is as high as 7.4.  CT of the abdomen is pending, as well as dialysis as soon as possible.  Dialysis Orders:  MWF @ AF 3:45     79.5 kg     2K/2.25Ca     400/A1.5       Heparin 1000 U        AVF @ LFA Hectorol 6 mcg           Aranesp 40 mcg on Wed       No Venofer  Past Medical History  Diagnosis Date  . Blood transfusion   . Preterm labor   . Dialysis patient   . Chronic kidney disease   . Hypertension   . Asthma     as a child  . Headache(784.0)   . GERD (gastroesophageal reflux disease)     nexium prn   Past Surgical History  Procedure Laterality Date  . Renal biopsy    . Dilation and curettage of uterus    . Dilation and evacuation  08/02/2011    Procedure: DILATATION AND EVACUATION;  Surgeon: Logan Bores, MD;  Location: [facility];  Service: Gynecology;;  . Av fistula placement Left 2010  . Revision of arteriovenous goretex graft Left 02/11/2013    Procedure: REVISION OF ARTERIOVENOUS GORTEX FISTULA;  Surgeon: Rosetta Posner, MD;  Location[facility];  Service: Vascular;  Laterality: Left;   Family History  Problem Relation Age of Onset  .  Hypertension    . Diabetes type II    . Diabetes Mother   . Hyperlipidemia Mother   . Hypertension Mother   . Heart disease Mother    Social History She reports that she has been passively smoking, but has never used smokeless tobacco. She reports that she uses illicit drugs (Marijuana). She reports that she does not drink alcohol.  Allergies  Allergen Reactions  . Morphine And Related Anaphylaxis    Patient says she stops breathing Can take percocet  . Prednisone Other (See Comments)    Patient says prednisone causes her to cramp all over, muscle spasms uncontrolled   Prior to Admission medications   Medication Sig Start Date End Date Taking? Authorizing Provider  acetaminophen (TYLENOL) 325 MG tablet Take 650 mg by mouth every 6 (six) hours as needed for mild pain or moderate pain.     Historical Provider, MD  albuterol (PROVENTIL HFA;VENTOLIN HFA) 108 (90 BASE) MCG/ACT inhaler Inhale 1-2 puffs into the lungs every 6 (six) hours as needed for wheezing or shortness of breath.    Historical Provider, MD  calcium acetate (PHOSLO) 667 MG capsule Take 2,668 mg by mouth 3 (three) times daily with meals.     Historical Provider, MD  cinacalcet (  SENSIPAR) 60 MG tablet Take 60 mg by mouth daily.    Historical Provider, MD  diphenhydrAMINE (BENADRYL) 25 MG tablet Take 25 mg by mouth every 6 (six) hours as needed for allergies or sleep.     Historical Provider, MD  esomeprazole (NEXIUM) 40 MG capsule Take 40 mg by mouth daily as needed (heart burn).     Historical Provider, MD  fluconazole (DIFLUCAN) 150 MG tablet Take 1 tablet (150 mg total) by mouth once. Can repeat dose three days later if no relief noted 08/22/13   Osborne Oman, MD  gabapentin (NEURONTIN) 100 MG capsule Take 200 mg by mouth at bedtime.    Historical Provider, MD  lanthanum (FOSRENOL) 1000 MG chewable tablet Chew 1,000 mg by mouth 2 (two) times daily with a meal.     Historical Provider, MD  metroNIDAZOLE (FLAGYL) 500 MG  tablet Take 1 tablet (500 mg total) by mouth 2 (two) times daily. 08/23/13 08/30/13  Osborne Oman, MD  Prenatal Multivit-Min-Fe-FA (PRENATAL VITAMINS) 0.8 MG tablet Take 1 tablet by mouth daily. 08/22/13   Osborne Oman, MD  promethazine (PHENERGAN) 25 MG tablet Take 1 tablet (25 mg total) by mouth every 6 (six) hours as needed for nausea or vomiting. 08/22/13   Osborne Oman, MD  zolpidem (AMBIEN) 5 MG tablet Take 1-2 tablets (5-10 mg total) by mouth at bedtime as needed for sleep. 08/22/13   Osborne Oman, MD   Labs:  Results for orders placed during the hospital encounter of 08/29/13 (from the past 48 hour(s))  BASIC METABOLIC PANEL     Status: Abnormal   Collection Time    08/29/13  3:32 PM      Result Value Ref Range   Sodium 135 (*) 137 - 147 mEq/L   Potassium 7.7 (*) 3.7 - 5.3 mEq/L   Comment: CRITICAL RESULT CALLED TO, READ BACK BY AND VERIFIED WITH:     Kathreen Cornfield 1628 08/29/13 D BRADLEY   Chloride 92 (*) 96 - 112 mEq/L   CO2 19  19 - 32 mEq/L   Glucose, Bld 77  70 - 99 mg/dL   BUN 87 (*) 6 - 23 mg/dL   Creatinine, Ser 19.63 (*) 0.50 - 1.10 mg/dL   Calcium 9.1  8.4 - 10.5 mg/dL   GFR calc non Af Amer 2 (*) >90 mL/min   GFR calc Af Amer 2 (*) >90 mL/min   Comment: (NOTE)     The eGFR has been calculated using the CKD EPI equation.     This calculation has not been validated in all clinical situations.     eGFR's persistently <90 mL/min signify possible Chronic Kidney     Disease.  HEPATIC FUNCTION PANEL     Status: Abnormal   Collection Time    08/29/13  3:32 PM      Result Value Ref Range   Total Protein 7.5  6.0 - 8.3 g/dL   Albumin 3.4 (*) 3.5 - 5.2 g/dL   AST 7  0 - 37 U/L   ALT 6  0 - 35 U/L   Alkaline Phosphatase 40  39 - 117 U/L   Total Bilirubin 0.3  0.3 - 1.2 mg/dL   Bilirubin, Direct <0.2  0.0 - 0.3 mg/dL   Indirect Bilirubin NOT CALCULATED  0.3 - 0.9 mg/dL  CBC WITH DIFFERENTIAL     Status: Abnormal   Collection Time    08/29/13  3:32 PM      Result  Value Ref Range   WBC 12.5 (*) 4.0 - 10.5 K/uL   RBC 2.81 (*) 3.87 - 5.11 MIL/uL   Hemoglobin 8.8 (*) 12.0 - 15.0 g/dL   HCT 26.6 (*) 36.0 - 46.0 %   MCV 94.7  78.0 - 100.0 fL   MCH 31.3  26.0 - 34.0 pg   MCHC 33.1  30.0 - 36.0 g/dL   RDW 13.3  11.5 - 15.5 %   Platelets 208  150 - 400 K/uL   Neutrophils Relative % 70  43 - 77 %   Neutro Abs 8.8 (*) 1.7 - 7.7 K/uL   Lymphocytes Relative 23  12 - 46 %   Lymphs Abs 2.8  0.7 - 4.0 K/uL   Monocytes Relative 5  3 - 12 %   Monocytes Absolute 0.6  0.1 - 1.0 K/uL   Eosinophils Relative 2  0 - 5 %   Eosinophils Absolute 0.3  0.0 - 0.7 K/uL   Basophils Relative 0  0 - 1 %   Basophils Absolute 0.0  0.0 - 0.1 K/uL   Constitutional: negative for chills, fatigue, fevers and sweats Ears, nose, mouth, throat, and face: negative for earaches, hoarseness, nasal congestion and sore throat Respiratory: negative for cough, dyspnea on exertion, hemoptysis and sputum Cardiovascular: positive for dyspnea, negative for chest pain, chest pressure/discomfort, orthopnea and palpitations Gastrointestinal: positive for abdominal pain, diarrhea, nausea and vomiting Genitourinary:negative, non-oliguric Musculoskeletal:negative for arthralgias, back pain, myalgias and neck pain Neurological: negative for dizziness, gait problems, headaches, paresthesia and speech problems  Physical Exam: Filed Vitals:   08/29/13 1615  BP:   Pulse: 95  Temp:   Resp: 18     General appearance: alert, cooperative and moderate distress Head: Normocephalic, without obvious abnormality, atraumatic Neck: no adenopathy, no carotid bruit, no JVD and supple, symmetrical, trachea midline Resp: clear to auscultation bilaterally Cardio: regular rate and rhythm, S1, S2 normal, no murmur, click, rub or gallop GI: soft, nondistended, diffuse tenderness over entire abdomen with guarding Extremities: extremities normal, atraumatic, no cyanosis or edema Neurologic: Grossly normal Dialysis  Access: AVF @ LFA with + bruit   Assessment/Plan: 1. Hyperkalemia - sec to missed HD, last K 7.4, received insulin, calcium gluconate & D50 in ED, HD pending. 2. Abdominal pain - with NVD, recently diagnosed fetal demise, CT pending. 3. Missed abortion - at 7 wks, scheduled for D&E today, but sent to [facility] for high K. 4. ESRD - HD on MWF @ AF, often noncompliant, missed Tx yesterday.  5. Hypertension/volume - BP 126/70, no meds; 3.2 L over EDW. 6. Anemia - Hgb low at 7.8, Aranesp 40 mcg on Wed. 7. Metabolic bone disease - Ca 9.1 (9.6 corrected), last P 12.7, iPTH 551; Hectorol 6 mcg, Phoslo & Fosrenol with meals, Sensipar prior to pregnancy.  8. Nutrition - Alb 3.4, currently NPO, vitamin.  LYLES,CHARLES 08/29/2013, 4:44 PM   Attending Nephrologist: Roney Jaffe, MD  Pt seen, examined and agree w A/P as above.  Kelly Splinter MD pager [phone]    cell [phone] 08/29/2013, 5:46 PM

## 2013-08-29 NOTE — Progress Notes (Addendum)
New Admission Note:   Arrival: from hemodialysis with RN Mental Orientation: A&Ox4 Telemetry: placed on 6E01, ST in 120's-NP on call aware Assessment:  See doc flowsheet Skin: intact, bruises on legs bilaterally, old incision on chest IV: right hand IV, saline locked, clean, dry, intact Pain: 8/10 cramping pain in abdominal area Safety Measures:  Call bell placed within reach; patient instructed on use of call bell and verbalized understanding. Bed in lowest position.  Yellow bracelet on.  Non-skid socks on.  Bed alarm on. [address] Orientation: Patient oriented to staff, room, and unit. Family: husband at bedside  Deferred admission questions till morning.  Orders have been reviewed and implemented. Will continue to monitor.  Arlyss Queen, RN, BSN

## 2013-08-29 NOTE — Progress Notes (Signed)
Pt has buttonholes and is a self stick. Pt inserted arterial needle without problems. Venous needle inserted by pt and would not produce a good blood return. Clots were pulled from the site. Pt was restuck with buttonhole needle and line immediately clotted off again. Pt was then stuck with a sharp needle and ran for 13 minutes. Pt then yelled out it blew. Infusion stopped. Large half egg size knot noted at site. Needle was pulled and ice applied. Dr. Joelyn Oms called and informed of problems with fistula running and infiltration. STAT renal panel sent to lab. Pt not able to be stuck at this time.

## 2013-08-29 NOTE — ED Notes (Signed)
CT called, ready in 10-15 minutes, after CT patient will go directly to dialysis

## 2013-08-29 NOTE — ED Provider Notes (Signed)
CSN: [id number]     Arrival date & time 08/29/13  1425 History   First MD Initiated Contact with Patient 08/29/13 1519     Chief Complaint  Patient presents with  . hyperkalemia      (Consider location/radiation/quality/duration/timing/severity/associated sxs/prior Treatment) HPI  Desiree [name] is a 38 y.o. female with past medical history significant for ESRD, dialyzed Monday Wednesday Friday, and missed her dialysis yesterday because of diffuse abdominal pain. [facility] (fetal demise at 7 weeks and (was discharged to home for evaluation of hyperkalemia (6.8) patient feels "like she swollen, she's 2.7 pounds over her dry weight. She feels like she has shortness of breath and denies chest pain or palpitations. She was dialyzed Foley on Friday. She has nausea vomiting and diarrhea followed by severe diffuse abdominal pain that is not alleviated by acetaminophen.  Nephrology [facility]  Past Medical History  Diagnosis Date  . Blood transfusion   . Preterm labor   . Dialysis patient   . Chronic kidney disease   . Hypertension   . Asthma     as a child  . Headache(784.0)   . GERD (gastroesophageal reflux disease)     nexium prn   Past Surgical History  Procedure Laterality Date  . Renal biopsy    . Dilation and curettage of uterus    . Dilation and evacuation  08/02/2011    Procedure: DILATATION AND EVACUATION;  Surgeon: Logan Bores, MD;  Location: [facility];  Service: Gynecology;;  . Av fistula placement Left 2010  . Revision of arteriovenous goretex graft Left 02/11/2013    Procedure: REVISION OF ARTERIOVENOUS GORTEX FISTULA;  Surgeon: Rosetta Posner, MD;  Location: [facility];  Service: Vascular;  Laterality: Left;   Family History  Problem Relation Age of Onset  . Hypertension    . Diabetes type II    . Diabetes Mother   . Hyperlipidemia Mother   . Hypertension Mother   . Heart disease Mother    History  Substance Use Topics  .  Smoking status: Passive Smoke Exposure - Never Smoker  . Smokeless tobacco: Never Used  . Alcohol Use: No   OB History   Grav Para Term Preterm Abortions TAB SAB Ect Mult Living   6 1 1  0 4 2 2  0 0 1     Review of Systems  10 systems reviewed and found to be negative, except as noted in the HPI.   Allergies  Morphine and related and Prednisone  Home Medications   Prior to Admission medications   Medication Sig Start Date End Date Taking? Authorizing Jakaiden Fill  acetaminophen (TYLENOL) 325 MG tablet Take 650 mg by mouth every 6 (six) hours as needed for mild pain or moderate pain.     Historical Flordia Kassem, MD  albuterol (PROVENTIL HFA;VENTOLIN HFA) 108 (90 BASE) MCG/ACT inhaler Inhale 1-2 puffs into the lungs every 6 (six) hours as needed for wheezing or shortness of breath.    Historical Makhia Vosler, MD  calcium acetate (PHOSLO) 667 MG capsule Take 2,668 mg by mouth 3 (three) times daily with meals.     Historical Jodi Kappes, MD  cinacalcet (SENSIPAR) 60 MG tablet Take 60 mg by mouth daily.    Historical Layah Skousen, MD  diphenhydrAMINE (BENADRYL) 25 MG tablet Take 25 mg by mouth every 6 (six) hours as needed for allergies or sleep.     Historical Joellyn Grandt, MD  esomeprazole (NEXIUM) 40 MG capsule Take 40 mg by mouth  daily as needed (heart burn).     Historical Uriyah Massimo, MD  fluconazole (DIFLUCAN) 150 MG tablet Take 1 tablet (150 mg total) by mouth once. Can repeat dose three days later if no relief noted 08/22/13   Osborne Oman, MD  gabapentin (NEURONTIN) 100 MG capsule Take 200 mg by mouth at bedtime.    Historical Zira Helinski, MD  lanthanum (FOSRENOL) 1000 MG chewable tablet Chew 1,000 mg by mouth 2 (two) times daily with a meal.     Historical Sylvie Mifsud, MD  metroNIDAZOLE (FLAGYL) 500 MG tablet Take 1 tablet (500 mg total) by mouth 2 (two) times daily. 08/23/13 08/30/13  Osborne Oman, MD  Prenatal Multivit-Min-Fe-FA (PRENATAL VITAMINS) 0.8 MG tablet Take 1 tablet by mouth daily. 08/22/13    Osborne Oman, MD  promethazine (PHENERGAN) 25 MG tablet Take 1 tablet (25 mg total) by mouth every 6 (six) hours as needed for nausea or vomiting. 08/22/13   Osborne Oman, MD  zolpidem (AMBIEN) 5 MG tablet Take 1-2 tablets (5-10 mg total) by mouth at bedtime as needed for sleep. 08/22/13   Osborne Oman, MD   BP 138/66  Pulse 90  Temp(Src) 99.3 F (37.4 C) (Oral)  Resp 18  Ht 5\' 5"  (1.651 m)  Wt 182 lb 5.1 oz (82.7 kg)  BMI 30.34 kg/m2  SpO2 100%  LMP 07/14/2013 Physical Exam  Nursing note and vitals reviewed. Constitutional: She is oriented to person, place, and time. She appears well-developed and well-nourished. No distress.  HENT:  Head: Normocephalic and atraumatic.  Mouth/Throat: Oropharynx is clear and moist.  Eyes: Conjunctivae and EOM are normal.  Cardiovascular: Normal rate, regular rhythm and intact distal pulses.   Pulmonary/Chest: Effort normal and breath sounds normal. No stridor. No respiratory distress. She has no wheezes. She has no rales. She exhibits no tenderness.  Abdominal: Soft. She exhibits no distension and no mass. There is tenderness. There is no rebound and no guarding.  Exquisiteness diffuse tenderness to palpation of all quadrants worse in the bilateral lower quadrants voluntary guarding and no rebound  Musculoskeletal: Normal range of motion.  Neurological: She is alert and oriented to person, place, and time.  Psychiatric: She has a normal mood and affect.    ED Course  Procedures (including critical care time)  CRITICAL CARE Performed by: Monico Blitz   Total critical care time: 40  Critical care time was exclusive of separately billable procedures and treating other patients.  Critical care was necessary to treat or prevent imminent or life-threatening deterioration.  Critical care was time spent personally by me on the following activities: development of treatment plan with patient and/or surrogate as well as nursing,  discussions with consultants, evaluation of patient's response to treatment, examination of patient, obtaining history from patient or surrogate, ordering and performing treatments and interventions, ordering and review of laboratory studies, ordering and review of radiographic studies, pulse oximetry and re-evaluation of patient's condition.   Labs Review Labs Reviewed  BASIC METABOLIC PANEL - Abnormal; Notable for the following:    Sodium 135 (*)    Potassium 7.7 (*)    Chloride 92 (*)    BUN 87 (*)    Creatinine, Ser 19.63 (*)    GFR calc non Af Amer 2 (*)    GFR calc Af Amer 2 (*)    All other components within normal limits  HEPATIC FUNCTION PANEL - Abnormal; Notable for the following:    Albumin 3.4 (*)    All other  components within normal limits  CBC WITH DIFFERENTIAL - Abnormal; Notable for the following:    WBC 12.5 (*)    RBC 2.81 (*)    Hemoglobin 8.8 (*)    HCT 26.6 (*)    Neutro Abs 8.8 (*)    All other components within normal limits  URINALYSIS, ROUTINE W REFLEX MICROSCOPIC - Abnormal; Notable for the following:    APPearance CLOUDY (*)    Glucose, UA 100 (*)    Hgb urine dipstick MODERATE (*)    Protein, ur 100 (*)    Leukocytes, UA TRACE (*)    All other components within normal limits  RENAL FUNCTION PANEL - Abnormal; Notable for the following:    Sodium 136 (*)    Chloride 91 (*)    CO2 17 (*)    Glucose, Bld 121 (*)    BUN 81 (*)    Creatinine, Ser 17.76 (*)    Phosphorus 6.6 (*)    Albumin 3.0 (*)    GFR calc non Af Amer 2 (*)    GFR calc Af Amer 3 (*)    All other components within normal limits  CBC - Abnormal; Notable for the following:    RBC 2.26 (*)    Hemoglobin 7.2 (*)    HCT 21.5 (*)    All other components within normal limits  CREATININE, SERUM - Abnormal; Notable for the following:    Creatinine, Ser 18.40 (*)    GFR calc non Af Amer 2 (*)    GFR calc Af Amer 2 (*)    All other components within normal limits  COMPREHENSIVE  METABOLIC PANEL - Abnormal; Notable for the following:    Sodium 135 (*)    Potassium 6.3 (*)    Chloride 92 (*)    BUN 86 (*)    Creatinine, Ser 18.61 (*)    Albumin 3.0 (*)    Alkaline Phosphatase 36 (*)    Total Bilirubin 0.2 (*)    GFR calc non Af Amer 2 (*)    GFR calc Af Amer 2 (*)    All other components within normal limits  CBC - Abnormal; Notable for the following:    WBC 11.2 (*)    RBC 2.25 (*)    Hemoglobin 7.1 (*)    HCT 21.3 (*)    All other components within normal limits  URINE MICROSCOPIC-ADD ON - Abnormal; Notable for the following:    Squamous Epithelial / LPF FEW (*)    All other components within normal limits  GLUCOSE, CAPILLARY - Abnormal; Notable for the following:    Glucose-Capillary 122 (*)    All other components within normal limits  I-STAT CHEM 8, ED - Abnormal; Notable for the following:    Sodium 133 (*)    Potassium 7.4 (*)    BUN 99 (*)    Creatinine, Ser >18.00 (*)    Calcium, Ion 1.07 (*)    Hemoglobin 7.8 (*)    HCT 23.0 (*)    All other components within normal limits  CBG MONITORING, ED - Abnormal; Notable for the following:    Glucose-Capillary 161 (*)    All other components within normal limits  CLOSTRIDIUM DIFFICILE BY PCR  LIPASE, BLOOD  GI PATHOGEN PANEL BY PCR, STOOL  DRUGS OF ABUSE SCREEN W/O ALC, ROUTINE URINE  BASIC METABOLIC PANEL  CBG MONITORING, ED    Imaging Review Ct Abdomen Pelvis W Contrast  08/29/2013   CLINICAL DATA:  Abdominal pain.  EXAM: CT ABDOMEN AND PELVIS WITH CONTRAST  TECHNIQUE: Multidetector CT imaging of the abdomen and pelvis was performed using the standard protocol following bolus administration of intravenous contrast.  CONTRAST:  57mL OMNIPAQUE IOHEXOL 300 MG/ML  SOLN  COMPARISON:  None.  FINDINGS: The lung bases demonstrate bibasilar atelectasis. No pericardial effusion.  The solid abdominal organs are unremarkable. There is a large left renal cyst and both kidneys are small.  The stomach,  duodenum, small bowel and colon are grossly normal. The appendix is normal. No mesenteric or retroperitoneal mass or adenopathy. The aorta and branch vessels are normal. The major venous structures are patent.  Uterus is enlarged and the endometrium has thickened. Patient has a recent pregnancy with fetal demise. Probable ongoing abortion. There are bilateral ovarian cysts. No free pelvic fluid collections.  The bony structures are unremarkable.  IMPRESSION: 1. Enlarged uterus and prominent endometrium due to recent pregnancy. Probable ongoing abortion. 2. Bilateral ovarian cyst. 3. No acute abdominal findings, mass lesions or adenopathy. 4. Small kidneys with history of end-stage renal disease.   Electronically Signed   By: Kalman Jewels M.D.   On: 08/29/2013 18:21     EKG Interpretation   Date/Time:  Tuesday August 29 2013 14:59:36 EDT Ventricular Rate:  88 PR Interval:  120 QRS Duration: 78 QT Interval:  354 QTC Calculation: 428 R Axis:   67 Text Interpretation:  Normal sinus rhythm Normal ECG Since last tracing ,  now with peaked T waves Confirmed by [facility]  MD, ELLIOTT [phone]) on  08/29/2013 3:48:18 PM      MDM   Final diagnoses:  Hyperkalemia  End stage renal disease  Missed abortion    Filed Vitals:   08/29/13 1450 08/29/13 1521  BP: 138/66   Pulse: 91 90  Temp: 99.3 F (37.4 C)   TempSrc: Oral   Resp: 24 18  Height: 5\' 5"  (1.651 m)   Weight: 182 lb 5.1 oz (82.7 kg)   SpO2: 99% 100%    Medications  HYDROmorphone (DILAUDID) injection 0.5 mg (0.5 mg Intravenous Given 08/29/13 1611)  albuterol (PROVENTIL) (2.5 MG/3ML) 0.083% nebulizer solution 10 mg (10 mg Nebulization Given 08/29/13 1555)  dextrose 50 % solution 50 mL (50 mLs Intravenous Given 08/29/13 1608)  insulin aspart (novoLOG) injection 10 Units (10 Units Intravenous Given 08/29/13 1615)  calcium gluconate inj 10% (1 g) URGENT USE ONLY! (1 g Intravenous Given 08/29/13 1616)    Manisha N Prior is a 38 y.o. female  presenting with hyperkalemia secondary to missing dialysis yesterday. Patient missed dialysis because of diffuse severe abdominal pain, nausea vomiting and diarrhea. Patient scheduled for D&E at [facility] hospital today but was sent to the ED for evaluation of hyperkalemia of 6.8. Today her K. is 7.7. She has EKG with peaked T waves. Patient started on albuterol, calcium chloride, and insulin and dextrose. Nephrology consult from Dr.  Melvia Heaps appreciated: He will perform emergent dialysis. [facility] will [facility] Dr. Coralyn Pear.  This is a shared visit with the attending physician who personally evaluated the patient and agrees with the care plan.    Monico Blitz, PA-C 09/05/13 1639

## 2013-08-29 NOTE — ED Notes (Signed)
Per pt sent here from [facility] (Desiree [name]) hospital with high potassium. Pt on dialysis and last was Tuesday. Pt experiencing a miscarriage currently.

## 2013-08-30 DIAGNOSIS — F329 Major depressive disorder, single episode, unspecified: Secondary | ICD-10-CM

## 2013-08-30 DIAGNOSIS — F3289 Other specified depressive episodes: Secondary | ICD-10-CM

## 2013-08-30 DIAGNOSIS — F1410 Cocaine abuse, uncomplicated: Secondary | ICD-10-CM

## 2013-08-30 LAB — CBC
HEMATOCRIT: 21.3 % — AB (ref 36.0–46.0)
Hemoglobin: 7.1 g/dL — ABNORMAL LOW (ref 12.0–15.0)
MCH: 31.6 pg (ref 26.0–34.0)
MCHC: 33.3 g/dL (ref 30.0–36.0)
MCV: 94.7 fL (ref 78.0–100.0)
Platelets: 176 10*3/uL (ref 150–400)
RBC: 2.25 MIL/uL — ABNORMAL LOW (ref 3.87–5.11)
RDW: 13.4 % (ref 11.5–15.5)
WBC: 11.2 10*3/uL — AB (ref 4.0–10.5)

## 2013-08-30 LAB — BASIC METABOLIC PANEL
BUN: 17 mg/dL (ref 6–23)
CHLORIDE: 96 meq/L (ref 96–112)
CO2: 27 meq/L (ref 19–32)
Calcium: 9.4 mg/dL (ref 8.4–10.5)
Creatinine, Ser: 5.8 mg/dL — ABNORMAL HIGH (ref 0.50–1.10)
GFR calc non Af Amer: 8 mL/min — ABNORMAL LOW (ref >90)
GFR, EST AFRICAN AMERICAN: 10 mL/min — AB (ref >90)
Glucose, Bld: 99 mg/dL (ref 70–99)
POTASSIUM: 4.2 meq/L (ref 3.7–5.3)
Sodium: 141 mEq/L (ref 137–147)

## 2013-08-30 LAB — URINALYSIS, ROUTINE W REFLEX MICROSCOPIC
BILIRUBIN URINE: NEGATIVE
GLUCOSE, UA: 100 mg/dL — AB
Ketones, ur: NEGATIVE mg/dL
Nitrite: NEGATIVE
PH: 8 (ref 5.0–8.0)
Protein, ur: 100 mg/dL — AB
SPECIFIC GRAVITY, URINE: 1.014 (ref 1.005–1.030)
Urobilinogen, UA: 0.2 mg/dL (ref 0.0–1.0)

## 2013-08-30 LAB — COMPREHENSIVE METABOLIC PANEL
ALBUMIN: 3 g/dL — AB (ref 3.5–5.2)
ALT: 5 U/L (ref 0–35)
AST: 6 U/L (ref 0–37)
Alkaline Phosphatase: 36 U/L — ABNORMAL LOW (ref 39–117)
BUN: 86 mg/dL — AB (ref 6–23)
CHLORIDE: 92 meq/L — AB (ref 96–112)
CO2: 19 mEq/L (ref 19–32)
Calcium: 8.7 mg/dL (ref 8.4–10.5)
Creatinine, Ser: 18.61 mg/dL — ABNORMAL HIGH (ref 0.50–1.10)
GFR calc Af Amer: 2 mL/min — ABNORMAL LOW (ref >90)
GFR calc non Af Amer: 2 mL/min — ABNORMAL LOW (ref >90)
Glucose, Bld: 87 mg/dL (ref 70–99)
Potassium: 6.3 mEq/L — ABNORMAL HIGH (ref 3.7–5.3)
SODIUM: 135 meq/L — AB (ref 137–147)
TOTAL PROTEIN: 6.6 g/dL (ref 6.0–8.3)
Total Bilirubin: 0.2 mg/dL — ABNORMAL LOW (ref 0.3–1.2)

## 2013-08-30 LAB — URINE MICROSCOPIC-ADD ON

## 2013-08-30 LAB — GLUCOSE, CAPILLARY: GLUCOSE-CAPILLARY: 122 mg/dL — AB (ref 70–99)

## 2013-08-30 MED ORDER — DIPHENHYDRAMINE HCL 25 MG PO CAPS
25.0000 mg | ORAL_CAPSULE | ORAL | Status: DC | PRN
  Administered 2013-08-30 – 2013-08-31 (×4): 25 mg via ORAL
  Filled 2013-08-30 (×3): qty 1

## 2013-08-30 MED ORDER — HYDROMORPHONE HCL PF 1 MG/ML IJ SOLN
INTRAMUSCULAR | Status: AC
  Administered 2013-08-30: 1 mg via INTRAVENOUS
  Filled 2013-08-30: qty 1

## 2013-08-30 MED ORDER — CAMPHOR-MENTHOL 0.5-0.5 % EX LOTN
TOPICAL_LOTION | CUTANEOUS | Status: DC | PRN
  Administered 2013-08-30: 21:00:00 via TOPICAL
  Filled 2013-08-30: qty 222

## 2013-08-30 MED ORDER — BISACODYL 5 MG PO TBEC
10.0000 mg | DELAYED_RELEASE_TABLET | Freq: Once | ORAL | Status: AC
  Administered 2013-08-31: 10 mg via ORAL
  Filled 2013-08-30: qty 2

## 2013-08-30 MED ORDER — BIOTENE DRY MOUTH MT LIQD
15.0000 mL | Freq: Two times a day (BID) | OROMUCOSAL | Status: DC
  Administered 2013-08-30: 15 mL via OROMUCOSAL

## 2013-08-30 NOTE — Progress Notes (Signed)
Patient refusing bed alarm this evening shift.  Re-educated patient on importance of utilizing bed alarm; patient still refused.  Non-skid socks refused.  Bed in lowest position.  Emphasized use of call bell if needed to use the bathroom; verbalized understanding.  Will continue to monitor patient.

## 2013-08-30 NOTE — Progress Notes (Addendum)
[facility] PROGRESS NOTE  Desiree [name] [id number] DOB: 19-Sep-1975 DOA: 08/29/2013 PCP: Windy Kalata, MD  Assessment/Plan: 1. Hyperkalemia. Patient with history of end-stage renal disease, likely secondary to missing hemodialysis, non compliance -HD today -Post HD potassium  2. ESRD on HD -as above  3. Abdominal pain. Patient complaining of abdominal pain since last Friday, associated with nausea, vomiting. -CT unremarkable, lipase, LFts normal -Seen by her Ob yesterday and pain not felt to be due to missed abortion  -called and d/w Dr.Anyanwu again, she feels that her pain is multifactorial and not due to her missed AB -she is trying to schedule per D&E Soon  4. History of hypertension - BP stable  5. Missed Abortion -as above, will d/w OB  DVT proph: hep SQ  Code Status: Full  Family Communication: none at bedside Disposition Plan: Home pending improvement in K   Consultants:  Renal  HPI/Subjective: Still complains of abd pain, but wants to eat  Objective: Filed Vitals:   08/30/13 1230  BP: 110/72  Pulse: 109  Temp: 98.2 F (36.8 C)  Resp: 19    Intake/Output Summary (Last 24 hours) at 08/30/13 1357 Last data filed at 08/30/13 1152  Gross per 24 hour  Intake      3 ml  Output   3011 ml  Net  -3008 ml   Filed Weights   08/29/13 1450 08/30/13 0702 08/30/13 1152  Weight: 82.7 kg (182 lb 5.1 oz) 83.6 kg (184 lb 4.9 oz) 80.6 kg (177 lb 11.1 oz)    Exam:   General:  AAOx3  Cardiovascular: S1S2/RRR  Respiratory: CTAB  Abdomen: soft, mild lower abd tenderness, BS present  Musculoskeletal: no edema c/c   Data Reviewed: Basic Metabolic Panel:  Recent Labs Lab 08/29/13 1210 08/29/13 1532 08/29/13 1543 08/29/13 1942 08/29/13 2230 08/30/13 0500 08/30/13 1255  NA 134* 135* 133* 136*  --  135* 141  K 6.8* 7.7* 7.4* 4.5  --  6.3* 4.2  CL 92* 92* 100 91*  --  92* 96  CO2 20 19  --  17*  --  19 27  GLUCOSE 86 77 76 121*  --   87 99  BUN 83* 87* 99* 81*  --  86* 17  CREATININE 19.69* 19.63* >18.00* 17.76* 18.40* 18.61* 5.80*  CALCIUM 9.3 9.1  --  8.9  --  8.7 9.4  PHOS  --   --   --  6.6*  --   --   --    Liver Function Tests:  Recent Labs Lab 08/29/13 1210 08/29/13 1532 08/29/13 1942 08/30/13 0500  AST 6 7  --  6  ALT 6 6  --  5  ALKPHOS 38* 40  --  36*  BILITOT 0.3 0.3  --  0.2*  PROT 7.5 7.5  --  6.6  ALBUMIN 3.2* 3.4* 3.0* 3.0*    Recent Labs Lab 08/29/13 2230  LIPASE 51   No results found for this basename: AMMONIA,  in the last 168 hours CBC:  Recent Labs Lab 08/29/13 1210 08/29/13 1532 08/29/13 1543 08/29/13 2230 08/30/13 0500  WBC 13.7* 12.5*  --  10.5 11.2*  NEUTROABS  --  8.8*  --   --   --   HGB 7.9* 8.8* 7.8* 7.2* 7.1*  HCT 24.0* 26.6* 23.0* 21.5* 21.3*  MCV 95.6 94.7  --  95.1 94.7  PLT 215 208  --  171 176   Cardiac Enzymes: No results found for this basename: CKTOTAL,  CKMB, CKMBINDEX, TROPONINI,  in the last 168 hours BNP (last 3 results) No results found for this basename: PROBNP,  in the last 8760 hours CBG:  Recent Labs Lab 08/29/13 1607 08/29/13 1706 08/30/13 1109  GLUCAP 72 161* 122*    Recent Results (from the past 240 hour(s))  CULTURE, OB URINE     Status: None   Collection Time    08/22/13  9:56 AM      Result Value Ref Range Status   Colony Count >=100,000 COLONIES/ML   Final   Organism ID, Bacteria Multiple bacterial morphotypes present, none   Final   Organism ID, Bacteria predominant. Suggest appropriate recollection if    Final   Organism ID, Bacteria clinically indicated.   Final  WET PREP, GENITAL     Status: Abnormal   Collection Time    08/22/13  9:56 AM      Result Value Ref Range Status   Yeast Wet Prep HPF POC NONE SEEN  NONE SEEN Final   Trich, Wet Prep NONE SEEN  NONE SEEN Final   Clue Cells Wet Prep HPF POC MOD (*) NONE SEEN Final   WBC, Wet Prep HPF POC FEW  NONE SEEN Final     Studies: Ct Abdomen Pelvis W  Contrast  08/29/2013   CLINICAL DATA:  Abdominal pain.  EXAM: CT ABDOMEN AND PELVIS WITH CONTRAST  TECHNIQUE: Multidetector CT imaging of the abdomen and pelvis was performed using the standard protocol following bolus administration of intravenous contrast.  CONTRAST:  22mL OMNIPAQUE IOHEXOL 300 MG/ML  SOLN  COMPARISON:  None.  FINDINGS: The lung bases demonstrate bibasilar atelectasis. No pericardial effusion.  The solid abdominal organs are unremarkable. There is a large left renal cyst and both kidneys are small.  The stomach, duodenum, small bowel and colon are grossly normal. The appendix is normal. No mesenteric or retroperitoneal mass or adenopathy. The aorta and branch vessels are normal. The major venous structures are patent.  Uterus is enlarged and the endometrium has thickened. Patient has a recent pregnancy with fetal demise. Probable ongoing abortion. There are bilateral ovarian cysts. No free pelvic fluid collections.  The bony structures are unremarkable.  IMPRESSION: 1. Enlarged uterus and prominent endometrium due to recent pregnancy. Probable ongoing abortion. 2. Bilateral ovarian cyst. 3. No acute abdominal findings, mass lesions or adenopathy. 4. Small kidneys with history of end-stage renal disease.   Electronically Signed   By: Kalman Jewels M.D.   On: 08/29/2013 18:21    Scheduled Meds: . antiseptic oral rinse  15 mL Mouth Rinse BID  . calcium acetate  2,668 mg Oral TID WC  . cinacalcet  60 mg Oral Q breakfast  . gabapentin  200 mg Oral QHS  . heparin  5,000 Units Subcutaneous 3 times per day  . lanthanum  1,000 mg Oral BID WC  . pantoprazole  40 mg Oral Daily  . sodium chloride  3 mL Intravenous Q12H  . sodium chloride  3 mL Intravenous Q12H   Continuous Infusions:  Antibiotics Given (last 72 hours)   None      Principal Problem:   ESRD (end stage renal disease) Active Problems:   Missed abortion   Hyperkalemia   Abdominal pain   Nausea & vomiting    Time  spent: 67min    Desiree [name],Desiree [name]  [facility] Pager [phone]. If 7PM-7AM, please contact night-coverage at [email or web address], password [facility] 08/30/2013, 1:57 PM  LOS: 1 day

## 2013-08-30 NOTE — Progress Notes (Signed)
Patient is vomiting in her room after HD.  HD was uncomplicated with no cramping or hypotension.  She says and her husband confirms that she has been vomiting and having abd pain for a week now since last Tuesday when she was diagnosed with fetal demise.  Cause of pain and vomiting is unclear. Would recommend having OB reassess and would not discharge until we know what's causing her abd pain. Have d/w primary MD.    Kelly Splinter MD (pgr) [phone]    [phone] 08/30/2013, 1:54 PM

## 2013-08-30 NOTE — Progress Notes (Signed)
[facility] Progress Note   Subjective: Still having abd pain.  Wants to eat solid food.  AVF infiltrated last night but is working fine today.  Filed Vitals:   08/30/13 0800 08/30/13 0830 08/30/13 0900 08/30/13 0930  BP: 136/80 140/74 138/75 154/86  Pulse: 122 114 107 105  Temp:      TempSrc:      Resp: 15 17 20 20   Height:      Weight:      SpO2:       Exam: Alert, no distress No jvd Chest clear bilat RRR no MRG ABd soft, minimally tender mid abdomen No LE edema L arm AVF patent Neuro is nf,Ox3  Dialysis: MWF @ [facility]  3:45   79.5 kg    2K/2.25Ca    400/A1.5    Heparin 1000    AVF @ LFA  Hectorol 6 mcg     Aranesp 40 mcg on Wed     No Venofer        Assessment: 1 Hyperkalemia- 6.3 today, on HD now 2 ESRD on HD 3 Fetal demise- OB notes say the D&E will need to be rescheduled and that it is not urgent 4 Anemia on aranesp 5 HTN/vol - up 3-4kg 6 HPTH cont meds  Plan- HD today, stable for d/c after HD from renal standpoint if K is down , will order 2 hour post HD K level    Kelly Splinter MD  pager [phone]    cell [phone]  08/30/2013, 9:54 AM     Recent Labs Lab 08/29/13 1532 08/29/13 1543 08/29/13 1942 08/29/13 2230 08/30/13 0500  NA 135* 133* 136*  --  135*  K 7.7* 7.4* 4.5  --  6.3*  CL 92* 100 91*  --  92*  CO2 19  --  17*  --  19  GLUCOSE 77 76 121*  --  87  BUN 87* 99* 81*  --  86*  CREATININE 19.63* >18.00* 17.76* 18.40* 18.61*  CALCIUM 9.1  --  8.9  --  8.7  PHOS  --   --  6.6*  --   --     Recent Labs Lab 08/29/13 1210 08/29/13 1532 08/29/13 1942 08/30/13 0500  AST 6 7  --  6  ALT 6 6  --  5  ALKPHOS 38* 40  --  36*  BILITOT 0.3 0.3  --  0.2*  PROT 7.5 7.5  --  6.6  ALBUMIN 3.2* 3.4* 3.0* 3.0*    Recent Labs Lab 08/29/13 1532 08/29/13 1543 08/29/13 2230 08/30/13 0500  WBC 12.5*  --  10.5 11.2*  NEUTROABS 8.8*  --   --   --   HGB 8.8* 7.8* 7.2* 7.1*  HCT 26.6* 23.0* 21.5* 21.3*  MCV 94.7  --  95.1 94.7  PLT  208  --  171 176   . antiseptic oral rinse  15 mL Mouth Rinse BID  . calcium acetate  2,668 mg Oral TID WC  . cinacalcet  60 mg Oral Q breakfast  . gabapentin  200 mg Oral QHS  . heparin  5,000 Units Subcutaneous 3 times per day  . lanthanum  1,000 mg Oral BID WC  . pantoprazole  40 mg Oral Daily  . sodium chloride  3 mL Intravenous Q12H  . sodium chloride  3 mL Intravenous Q12H     sodium chloride, sodium chloride, sodium chloride, sodium chloride, sodium chloride, acetaminophen, acetaminophen, albuterol, alum & mag hydroxide-simeth, bisacodyl, camphor-menthol, diphenhydrAMINE,  feeding supplement (NEPRO CARB STEADY), heparin, HYDROmorphone (DILAUDID) injection, lidocaine (PF), lidocaine-prilocaine, ondansetron (ZOFRAN) IV, ondansetron, pentafluoroprop-tetrafluoroeth, sodium chloride

## 2013-08-31 ENCOUNTER — Encounter (HOSPITAL_COMMUNITY)
Admission: EM | Disposition: A | Discharge status: Home / Self Care | Payer: Self-pay | Source: Home / Self Care | Attending: Internal Medicine

## 2013-08-31 ENCOUNTER — Encounter (HOSPITAL_COMMUNITY)
Admission: RE | Disposition: A | Discharge status: Home / Self Care | Payer: Self-pay | Source: Ambulatory Visit | Attending: Obstetrics & Gynecology

## 2013-08-31 ENCOUNTER — Encounter (HOSPITAL_COMMUNITY): Payer: Medicare Other | Admitting: Anesthesiology

## 2013-08-31 ENCOUNTER — Ambulatory Visit (HOSPITAL_COMMUNITY)
Admission: RE | Admit: 2013-08-31 | Discharge: 2013-08-31 | Disposition: A | Discharge status: Home / Self Care | Payer: Medicare Other | Source: Ambulatory Visit | Attending: Obstetrics & Gynecology | Admitting: Obstetrics & Gynecology

## 2013-08-31 ENCOUNTER — Encounter (HOSPITAL_COMMUNITY): Payer: Self-pay | Admitting: Anesthesiology

## 2013-08-31 ENCOUNTER — Ambulatory Visit (HOSPITAL_COMMUNITY): Payer: Medicare Other | Admitting: Anesthesiology

## 2013-08-31 DIAGNOSIS — O021 Missed abortion: Secondary | ICD-10-CM | POA: Diagnosis present

## 2013-08-31 DIAGNOSIS — D649 Anemia, unspecified: Secondary | ICD-10-CM | POA: Insufficient documentation

## 2013-08-31 DIAGNOSIS — I659 Occlusion and stenosis of unspecified precerebral artery: Secondary | ICD-10-CM

## 2013-08-31 DIAGNOSIS — I120 Hypertensive chronic kidney disease with stage 5 chronic kidney disease or end stage renal disease: Secondary | ICD-10-CM

## 2013-08-31 DIAGNOSIS — N186 End stage renal disease: Secondary | ICD-10-CM | POA: Diagnosis not present

## 2013-08-31 DIAGNOSIS — J45909 Unspecified asthma, uncomplicated: Secondary | ICD-10-CM | POA: Insufficient documentation

## 2013-08-31 DIAGNOSIS — K219 Gastro-esophageal reflux disease without esophagitis: Secondary | ICD-10-CM | POA: Diagnosis not present

## 2013-08-31 DIAGNOSIS — R112 Nausea with vomiting, unspecified: Secondary | ICD-10-CM

## 2013-08-31 HISTORY — PX: DILATION AND EVACUATION: SHX1459

## 2013-08-31 LAB — TYPE AND SCREEN
ABO/RH(D): B NEG
Antibody Screen: NEGATIVE

## 2013-08-31 LAB — DRUGS OF ABUSE SCREEN W/O ALC, ROUTINE URINE
AMPHETAMINE SCRN UR: NEGATIVE
BARBITURATE QUANT UR: NEGATIVE
BENZODIAZEPINES.: NEGATIVE
COCAINE METABOLITES: POSITIVE — AB
Creatinine,U: 65.8 mg/dL
Marijuana Metabolite: NEGATIVE
Methadone: NEGATIVE
Opiate Screen, Urine: NEGATIVE
Phencyclidine (PCP): NEGATIVE
Propoxyphene: NEGATIVE

## 2013-08-31 LAB — COMPREHENSIVE METABOLIC PANEL
ALK PHOS: 37 U/L — AB (ref 39–117)
ALT: 7 U/L (ref 0–35)
AST: 11 U/L (ref 0–37)
Albumin: 3.2 g/dL — ABNORMAL LOW (ref 3.5–5.2)
BUN: 25 mg/dL — ABNORMAL HIGH (ref 6–23)
CHLORIDE: 97 meq/L (ref 96–112)
CO2: 29 mEq/L (ref 19–32)
Calcium: 9.8 mg/dL (ref 8.4–10.5)
Creatinine, Ser: 9.28 mg/dL — ABNORMAL HIGH (ref 0.50–1.10)
GFR calc non Af Amer: 5 mL/min — ABNORMAL LOW (ref >90)
GFR, EST AFRICAN AMERICAN: 6 mL/min — AB (ref >90)
GLUCOSE: 92 mg/dL (ref 70–99)
POTASSIUM: 4.7 meq/L (ref 3.7–5.3)
Sodium: 138 mEq/L (ref 137–147)
TOTAL PROTEIN: 7.1 g/dL (ref 6.0–8.3)
Total Bilirubin: 0.3 mg/dL (ref 0.3–1.2)

## 2013-08-31 LAB — RAPID URINE DRUG SCREEN, HOSP PERFORMED
AMPHETAMINES: NOT DETECTED
Barbiturates: NOT DETECTED
Benzodiazepines: NOT DETECTED
COCAINE: POSITIVE — AB
Opiates: NOT DETECTED
TETRAHYDROCANNABINOL: NOT DETECTED

## 2013-08-31 LAB — CBC
HCT: 22.8 % — ABNORMAL LOW (ref 36.0–46.0)
HEMOGLOBIN: 7.4 g/dL — AB (ref 12.0–15.0)
MCH: 31.5 pg (ref 26.0–34.0)
MCHC: 32.5 g/dL (ref 30.0–36.0)
MCV: 97 fL (ref 78.0–100.0)
Platelets: 177 10*3/uL (ref 150–400)
RBC: 2.35 MIL/uL — ABNORMAL LOW (ref 3.87–5.11)
RDW: 13.4 % (ref 11.5–15.5)
WBC: 7.8 10*3/uL (ref 4.0–10.5)

## 2013-08-31 SURGERY — DILATION AND EVACUATION, UTERUS
Anesthesia: Choice

## 2013-08-31 SURGERY — DILATION AND EVACUATION, UTERUS
Anesthesia: Monitor Anesthesia Care | Site: Uterus

## 2013-08-31 MED ORDER — FENTANYL CITRATE 0.05 MG/ML IJ SOLN
25.0000 ug | INTRAMUSCULAR | Status: DC | PRN
  Administered 2013-08-31 (×2): 50 ug via INTRAVENOUS

## 2013-08-31 MED ORDER — SODIUM CHLORIDE 0.9 % IV SOLN
INTRAVENOUS | Status: DC | PRN
  Administered 2013-08-31: 12:00:00 via INTRAVENOUS

## 2013-08-31 MED ORDER — FENTANYL CITRATE 0.05 MG/ML IJ SOLN
INTRAMUSCULAR | Status: AC
  Administered 2013-08-31: 50 ug via INTRAVENOUS
  Filled 2013-08-31: qty 2

## 2013-08-31 MED ORDER — MEPERIDINE HCL 25 MG/ML IJ SOLN
6.2500 mg | INTRAMUSCULAR | Status: DC | PRN
Start: 2013-08-31 — End: 2013-08-31

## 2013-08-31 MED ORDER — MEPERIDINE HCL 25 MG/ML IJ SOLN: 6.2500 mg | INTRAMUSCULAR | Status: DC | PRN

## 2013-08-31 MED ORDER — FENTANYL CITRATE 0.05 MG/ML IJ SOLN
INTRAMUSCULAR | Status: DC | PRN
  Administered 2013-08-31 (×4): 50 ug via INTRAVENOUS

## 2013-08-31 MED ORDER — OXYCODONE-ACETAMINOPHEN 5-325 MG PO TABS: 1.0000 | ORAL_TABLET | Freq: Four times a day (QID) | ORAL | Status: DC | PRN

## 2013-08-31 MED ORDER — FENTANYL CITRATE 0.05 MG/ML IJ SOLN
INTRAMUSCULAR | Status: AC
  Filled 2013-08-31: qty 2

## 2013-08-31 MED ORDER — ONDANSETRON HCL 4 MG/2ML IJ SOLN
INTRAMUSCULAR | Status: DC | PRN
  Administered 2013-08-31: 4 mg via INTRAVENOUS

## 2013-08-31 MED ORDER — OXYCODONE-ACETAMINOPHEN 5-325 MG PO TABS
2.0000 | ORAL_TABLET | ORAL | Status: DC | PRN
  Administered 2013-08-31: 2 via ORAL

## 2013-08-31 MED ORDER — LIDOCAINE HCL (CARDIAC) 20 MG/ML IV SOLN
INTRAVENOUS | Status: AC
  Filled 2013-08-31: qty 5

## 2013-08-31 MED ORDER — PROPOFOL 10 MG/ML IV EMUL
INTRAVENOUS | Status: AC
  Filled 2013-08-31: qty 20

## 2013-08-31 MED ORDER — PROPOFOL INFUSION 10 MG/ML OPTIME
INTRAVENOUS | Status: DC | PRN
  Administered 2013-08-31: 200 ug/kg/min via INTRAVENOUS

## 2013-08-31 MED ORDER — RHO D IMMUNE GLOBULIN 1500 UNIT/2ML IJ SOSY
300.0000 ug | PREFILLED_SYRINGE | Freq: Once | INTRAMUSCULAR | Status: AC
  Administered 2013-08-31: 300 ug via INTRAVENOUS
  Filled 2013-08-31: qty 2

## 2013-08-31 MED ORDER — BUPIVACAINE HCL (PF) 0.5 % IJ SOLN
INTRAMUSCULAR | Status: AC
  Filled 2013-08-31: qty 30

## 2013-08-31 MED ORDER — RHO D IMMUNE GLOBULIN 1500 UNIT/2ML IJ SOSY
300.0000 ug | PREFILLED_SYRINGE | Freq: Once | INTRAMUSCULAR | Status: DC
  Filled 2013-08-31: qty 2

## 2013-08-31 MED ORDER — DEXAMETHASONE SODIUM PHOSPHATE 10 MG/ML IJ SOLN
INTRAMUSCULAR | Status: AC
  Filled 2013-08-31: qty 1

## 2013-08-31 MED ORDER — MIDAZOLAM HCL 5 MG/5ML IJ SOLN
INTRAMUSCULAR | Status: DC | PRN
  Administered 2013-08-31 (×2): 1 mg via INTRAVENOUS

## 2013-08-31 MED ORDER — FENTANYL CITRATE 0.05 MG/ML IJ SOLN: 25.0000 ug | INTRAMUSCULAR | Status: DC | PRN

## 2013-08-31 MED ORDER — MIDAZOLAM HCL 2 MG/2ML IJ SOLN
INTRAMUSCULAR | Status: AC
  Filled 2013-08-31: qty 2

## 2013-08-31 MED ORDER — PROMETHAZINE HCL 25 MG/ML IJ SOLN: 6.2500 mg | INTRAMUSCULAR | Status: DC | PRN

## 2013-08-31 MED ORDER — OXYCODONE-ACETAMINOPHEN 5-325 MG PO TABS
ORAL_TABLET | ORAL | Status: AC
  Filled 2013-08-31: qty 2

## 2013-08-31 MED ORDER — MIDAZOLAM HCL 2 MG/2ML IJ SOLN: 0.5000 mg | Freq: Once | INTRAMUSCULAR | Status: DC | PRN

## 2013-08-31 MED ORDER — BUPIVACAINE HCL 0.5 % IJ SOLN
INTRAMUSCULAR | Status: DC | PRN
  Administered 2013-08-31: 10 mL

## 2013-08-31 MED ORDER — ONDANSETRON HCL 4 MG/2ML IJ SOLN
INTRAMUSCULAR | Status: AC
  Filled 2013-08-31: qty 2

## 2013-08-31 MED ORDER — DOXYCYCLINE HYCLATE 100 MG IV SOLR
200.0000 mg | INTRAVENOUS | Status: AC
  Administered 2013-08-31: 200 mg via INTRAVENOUS
  Filled 2013-08-31: qty 200

## 2013-08-31 MED ORDER — OXYTOCIN 10 UNIT/ML IJ SOLN
INTRAMUSCULAR | Status: DC | PRN
  Administered 2013-08-31: 20 [IU] via INTRAMUSCULAR

## 2013-08-31 MED ORDER — LACTATED RINGERS IV SOLN
INTRAVENOUS | Status: DC
  Administered 2013-08-31: 11:00:00 via INTRAVENOUS

## 2013-08-31 MED ORDER — PANTOPRAZOLE SODIUM 40 MG PO TBEC: 40.0000 mg | DELAYED_RELEASE_TABLET | Freq: Every day | ORAL | Status: DC

## 2013-08-31 MED ORDER — OXYTOCIN 10 UNIT/ML IJ SOLN
INTRAMUSCULAR | Status: AC
  Filled 2013-08-31: qty 1

## 2013-08-31 SURGICAL SUPPLY — 22 items
CATH ROBINSON RED A/P 16FR (CATHETERS) ×3 IMPLANT
CLOTH BEACON ORANGE TIMEOUT ST (SAFETY) ×3 IMPLANT
DECANTER SPIKE VIAL GLASS SM (MISCELLANEOUS) ×3 IMPLANT
GLOVE BIO SURGEON STRL SZ 6 (GLOVE) ×6 IMPLANT
GLOVE BIOGEL PI IND STRL 7.0 (GLOVE) ×1 IMPLANT
GLOVE BIOGEL PI IND STRL 7.5 (GLOVE) ×4 IMPLANT
GLOVE BIOGEL PI INDICATOR 7.0 (GLOVE) ×2
GLOVE BIOGEL PI INDICATOR 7.5 (GLOVE) ×8
GLOVE ECLIPSE 6.5 STRL STRAW (GLOVE) ×9 IMPLANT
GLOVE ECLIPSE 7.0 STRL STRAW (GLOVE) ×3 IMPLANT
GLOVE SURG SS PI 7.0 STRL IVOR (GLOVE) ×3 IMPLANT
GOWN STRL REUS W/TWL LRG LVL3 (GOWN DISPOSABLE) ×6 IMPLANT
KIT BERKELEY 1ST TRIMESTER 3/8 (MISCELLANEOUS) ×3 IMPLANT
NS IRRIG 1000ML POUR BTL (IV SOLUTION) ×3 IMPLANT
PACK VAGINAL MINOR WOMEN LF (CUSTOM PROCEDURE TRAY) ×3 IMPLANT
PAD OB MATERNITY 4.3X12.25 (PERSONAL CARE ITEMS) ×3 IMPLANT
PAD PREP 24X48 CUFFED NSTRL (MISCELLANEOUS) ×3 IMPLANT
SET BERKELEY SUCTION TUBING (SUCTIONS) ×3 IMPLANT
TOWEL OR 17X24 6PK STRL BLUE (TOWEL DISPOSABLE) ×6 IMPLANT
VACURETTE 6 ASPIR F TIP BERK (CANNULA) IMPLANT
VACURETTE 8 RIGID CVD (CANNULA) IMPLANT
VACURETTE 9 RIGID CVD (CANNULA) ×3 IMPLANT

## 2013-08-31 NOTE — Anesthesia Preprocedure Evaluation (Addendum)
Anesthesia Evaluation  Patient identified by MRN, date of birth, ID band Patient awake    Reviewed: Allergy & Precautions, H&P , Patient's Chart, lab work & pertinent test results, reviewed documented beta blocker date and time   History of Anesthesia Complications Negative for: history of anesthetic complications  Airway Mallampati: III TM Distance: >3 FB Neck ROM: full    Dental   Pulmonary asthma ,  breath sounds clear to auscultation        Cardiovascular Exercise Tolerance: Good hypertension, Rhythm:regular Rate:Normal     Neuro/Psych  Headaches, negative psych ROS   GI/Hepatic GERD-  Controlled,  Endo/Other    Renal/GU ESRFRenal disease     Musculoskeletal   Abdominal   Peds  Hematology  (+) anemia ,   Anesthesia Other Findings   Reproductive/Obstetrics                          Anesthesia Physical  Anesthesia Plan  ASA: III  Anesthesia Plan: MAC   Post-op Pain Management:    Induction:   Airway Management Planned:   Additional Equipment:   Intra-op Plan:   Post-operative Plan:   Informed Consent: I have reviewed the patients History and Physical, chart, labs and discussed the procedure including the risks, benefits and alternatives for the proposed anesthesia with the patient or authorized representative who has indicated his/her understanding and acceptance.   Dental Advisory Given  Plan Discussed with: CRNA, Surgeon and Anesthesiologist  Anesthesia Plan Comments:         Anesthesia Quick Evaluation

## 2013-08-31 NOTE — Transfer of Care (Signed)
Immediate Anesthesia Transfer of Care Note  Patient: Desiree [name]  Procedure(s) Performed: Procedure(s): DILATATION AND EVACUATION (N/A)  Patient Location: PACU  Anesthesia Type:MAC  Level of Consciousness: sedated  Airway & Oxygen Therapy: Patient Spontanous Breathing and Patient connected to nasal cannula oxygen  Post-op Assessment: Report given to PACU RN and Post -op Vital signs reviewed and stable  Post vital signs: stable  Complications: No apparent anesthesia complications

## 2013-08-31 NOTE — Discharge Instructions (Signed)
DISCHARGE INSTRUCTIONS: D&C / D&E The following instructions have been prepared to help you care for yourself upon your return home.   Personal hygiene:  Use sanitary pads for vaginal drainage, not tampons.  Shower the day after your procedure.  NO tub baths, pools or Jacuzzis for 2-3 weeks.  Wipe front to back after using the bathroom.  Activity and limitations:  Do NOT drive or operate any equipment for 24 hours. The effects of anesthesia are still present and drowsiness may result.  Do NOT rest in bed all day.  Walking is encouraged.  Walk up and down stairs slowly.  You may resume your normal activity in one to two days or as indicated by your physician.  Sexual activity: NO intercourse for at least 2 weeks after the procedure, or as indicated by your physician.  Diet: Eat a light meal as desired this evening. You may resume your usual diet tomorrow.  Return to work: You may resume your work activities in one to two days or as indicated by your doctor.  What to expect after your surgery: Expect to have vaginal bleeding/discharge for 2-3 days and spotting for up to 10 days. It is not unusual to have soreness for up to 1-2 weeks. You may have a slight burning sensation when you urinate for the first day. Mild cramps may continue for a couple of days. You may have a regular period in 2-6 weeks.  Call your doctor for any of the following:  Excessive vaginal bleeding, saturating and changing one pad every hour.  Inability to urinate 6 hours after discharge from hospital.  Pain not relieved by pain medication.  Fever of 100.4 F or greater.  Unusual vaginal discharge or odor.   Call for an appointment:    Patients signature: ______________________  Support person's signature_______________________  Nurses signature ________________________

## 2013-08-31 NOTE — Progress Notes (Signed)
Patient was discharged with family. Patient was discharged to [facility] for D&E procedure at 1000 am. Patient was given discharge orders and was sent home with belongings. Patient was stable upon discharge.

## 2013-08-31 NOTE — Interval H&P Note (Signed)
History and Physical Interval Note 08/31/2013 10:59 AM  Desiree [name]  has presented today for surgery, with the diagnosis of Missed Abortion  The various methods of treatment have been discussed with the patient and family. After consideration of risks, benefits and other options for treatment, the patient has consented to  Procedure(s):DILATATION AND EVACUATION (N/A) as a surgical intervention .  The patient's history has been reviewed, patient examined, no change in status, stable for surgery.  I have reviewed the patient's chart and labs.  Questions were answered to the patient's satisfaction.  To OR when ready. Of note, patient declines IUD placement during surgery, desires OCPs for contraception. She declined BTL during earlier encounters, and declined it today.    Verita Schneiders, MD, [place] Attending [place], [facility]

## 2013-08-31 NOTE — H&P (View-Only) (Signed)
Faculty Practice OB/GYN Attending Note  Desiree [name] is a 38 y.o. [phone] with recently diagnosed fetal demise (missed abortion) around [redacted] weeks GA, in the setting of ESRD on hemodialysis. Given her ESRD, she was scheduled for D&E today knowing that she was getting hemodialysis yesterday; she is on M-W-F schedule.  On encounter today in the preoperative area, she reports that she did not get dialyzed yesterday secondary to diffuse abdominal pain.  No bleeding and no other symptoms attributable to her missed abortion at 7 weeks.  On exam, she is moaning, and groaning and diffuse all over abdomen.  BP 133/74  Pulse 97  Temp(Src) 99 F (37.2 C) (Oral)  Resp 16  SpO2 99%.  Labs are below  Results for orders placed during the hospital encounter of 08/29/13 (from the past 24 hour(s))  CBC     Status: Abnormal   Collection Time    08/29/13 12:10 PM      Result Value Ref Range   WBC 13.7 (*) 4.0 - 10.5 K/uL   RBC 2.51 (*) 3.87 - 5.11 MIL/uL   Hemoglobin 7.9 (*) 12.0 - 15.0 g/dL   HCT 24.0 (*) 36.0 - 46.0 %   MCV 95.6  78.0 - 100.0 fL   MCH 31.5  26.0 - 34.0 pg   MCHC 32.9  30.0 - 36.0 g/dL   RDW 13.5  11.5 - 15.5 %   Platelets 215  150 - 400 K/uL  TYPE AND SCREEN     Status: None   Collection Time    08/29/13 12:10 PM      Result Value Ref Range   ABO/RH(D) B NEG     Antibody Screen NEG     Sample Expiration 09/01/2013    COMPREHENSIVE METABOLIC PANEL     Status: Abnormal   Collection Time    08/29/13 12:10 PM      Result Value Ref Range   Sodium 134 (*) 137 - 147 mEq/L   Potassium 6.8 (*) 3.7 - 5.3 mEq/L   Chloride 92 (*) 96 - 112 mEq/L   CO2 20  19 - 32 mEq/L   Glucose, Bld 86  70 - 99 mg/dL   BUN 83 (*) 6 - 23 mg/dL   Creatinine, Ser 19.69 (*) 0.50 - 1.10 mg/dL   Calcium 9.3  8.4 - 10.5 mg/dL   Total Protein 7.5  6.0 - 8.3 g/dL   Albumin 3.2 (*) 3.5 - 5.2 g/dL   AST 6  0 - 37 U/L   ALT 6  0 - 35 U/L   Alkaline Phosphatase 38 (*) 39 - 117 U/L   Total Bilirubin 0.3  0.3 -  1.2 mg/dL   GFR calc non Af Amer 2 (*) >90 mL/min   GFR calc Af Amer 2 (*) >90 mL/min   Given elevated K and Cr; patient needs evaluation and possibly urgent dialysis. She was instructed to go to [facility] ED.  After she gets her dialysis and is stable, will reschedule her D&E; this is not urgent.  Her pain and symptoms are not as a result of her 7 week MAB; she needs to be stabilized before this matter is addressed.  Furthermore, our Anesthesiology team feels it is not prudent to proceed with an elective procedure at this time given her situation.  Will follow up with patient.  Plan discussed with her, her husband and daughter. She was discharged from [facility] and instructed to go to [facility] ED now.  UGONNA  Harolyn Rutherford, MD, [place] Attending [place], [facility]

## 2013-08-31 NOTE — Op Note (Signed)
Procedure: Suction dilation and curettage Preoperative diagnosis: Missed abortion at [redacted] weeks gestation and the end stage renal disease Postoperative diagnosis: Same Surgeon: Dr. Emeterio Reeve Assistant: Dr. Glyn Ade Anesthesia: MAC and paracervical block Estimated blood loss: 100 mL Specimen: Products of conception Locations: None Drains: None Counts: Correct  Patient gave written consent for suction dilation and curettage for diagnosis of missed abortion at [redacted] weeks gestation. Patient has end-stage renal disease and had been dialyzed the day before the procedure. Patient identification was confirmed she was brought to the or and MAC anesthesia was induced. She's placed in dorsal lithotomy position. Perineum and vagina were sterilely prepped and draped. Bladder was drained with red rubber catheter and about 30 mL of clear urine was drained. Speculum was inserted and cervix was visualized. 10 mL of half percent Marcaine was infiltrated for intracervical block. Cervix was grasped with single-tooth tenaculum. Uterus sounded to 11 cm. Cervix was dilated sufficiently to pass a 9 mm suction curette. Products of conception were obtained. There was moderate bleeding during the procedure she received 20 units of Pitocin IM. Complete evacuation uterine cavity was assured. Bleeding was minimal at the end of the procedure. All instruments removed. She is brought in stable condition to PACU.  Dr. Emeterio Reeve 08/31/2013 1:05 PM

## 2013-08-31 NOTE — Progress Notes (Signed)
Subjective:  Abdominal pain improving, no nausea, no recent BM, D&E at [facility] this morning  Objective: Vital signs in last 24 hours: Temp:  [98.2 F (36.8 C)-99.2 F (37.3 C)] 98.7 F (37.1 C) (06/18 0511) Pulse Rate:  [91-114] 91 (06/18 0511) Resp:  [17-22] 17 (06/18 0511) BP: (110-154)/(60-86) 111/60 mmHg (06/18 0511) SpO2:  [98 %-100 %] 100 % (06/18 0511) Weight:  [80.5 kg (177 lb 7.5 oz)-80.6 kg (177 lb 11.1 oz)] 80.5 kg (177 lb 7.5 oz) (06/17 2226) Weight change: 0.9 kg (1 lb 15.7 oz)  Intake/Output from previous day: 06/17 0701 - 06/18 0700 In: 960 [P.O.:960] Out: 3011    Lab Results:  Recent Labs  08/29/13 2230 08/30/13 0500  WBC 10.5 11.2*  HGB 7.2* 7.1*  HCT 21.5* 21.3*  PLT 171 176   BMET:  Recent Labs  08/29/13 1942  08/30/13 0500 08/30/13 1255  NA 136*  --  135* 141  K 4.5  --  6.3* 4.2  CL 91*  --  92* 96  CO2 17*  --  19 27  GLUCOSE 121*  --  87 99  BUN 81*  --  86* 17  CREATININE 17.76*  < > 18.61* 5.80*  CALCIUM 8.9  --  8.7 9.4  ALBUMIN 3.0*  --  3.0*  --   < > = values in this interval not displayed. No results found for this basename: PTH,  in the last 72 hours Iron Studies: No results found for this basename: IRON, TIBC, TRANSFERRIN, FERRITIN,  in the last 72 hours  Studies/Results: No results found.  EXAM: General appearance:  Alert, in no apparent distress Resp:  CTA without rales, rhonchi, or wheezes Cardio:  RRR without murmur or rub GI:  + BS, soft with mild lower abdominal tenderness Extremities:  No edema Access:  AVF @ LFA with + bruit  Dialysis: MWF @ [facility]  3:45 79.5 kg 2K/2.25Ca 400/A1.5 Heparin 1000 AVF @ LFA  Hectorol 6 mcg Aranesp 40 mcg on Wed No Venofer  Assessment/Plan: 1. Hyperkalemia - initially 7.4, sec to missed HD, 4.2 s/p HD. 2. Abdominal pain - with NVD, improving, recently diagnosed fetal demise, CT negative. 3. Missed abortion - at 7 wks, scheduled for D&E 6/16, but sent to [facility] for high K.  D&E @  [facility] pending this morning. 4. ESRD - HD on MWF @ [facility].  Next HD tomorrow, likely @ [facility]. 5. Hypertension/volume - BP 111/60, no meds; wt 80.5 kg s/p net UF 3 L yesterday. 6. Anemia - Hgb down to 7.1, Aranesp 40 mcg on Wed. 7. Metabolic bone disease - Ca 9.4 (10.2 corrected), last P 12.7, iPTH 551; Hectorol 6 mcg, Phoslo & Fosrenol with meals, Sensipar prior to pregnancy.  Hold Hectorol. 8. Nutrition - Alb 3, currently NPO, vitamin.     LOS: 2 days   LYLES,CHARLES 08/31/2013,8:10 AM   Pt seen, examined and agree w A/P as above.  Kelly Splinter MD pager [phone]    cell [phone] 08/31/2013, 11:17 AM

## 2013-08-31 NOTE — Discharge Summary (Addendum)
Physician Discharge Summary  Desiree [name] [id number] DOB: [birth year] DOA: 08/29/2013  PCP: Windy Kalata, MD  Admit date: 08/29/2013 Discharge date: 08/31/2013  Time spent: 45 minutes  Recommendations for Outpatient Follow-up:  1. [facility] at 10 am for D&E  Discharge Diagnoses:    Hyperkalemia   ESRD (end stage renal disease)   Missed abortion   Abdominal pain   Nausea & vomiting     Discharge Condition: stable  Diet recommendation: Renal  Filed Weights   08/30/13 0702 08/30/13 1152 08/30/13 2226  Weight: 83.6 kg (184 lb 4.9 oz) 80.6 kg (177 lb 11.1 oz) 80.5 kg (177 lb 7.5 oz)    History of present illness:  Desiree [name] is a 38 y.o. female with a past medical history of end-stage renal disease, currently on hemodialysis on Mondays Wednesdays and Fridays, history of noncompliance to her dialysis sessions, polysubstance abuse, presented to the emergency department with complaints of a total, nausea, vomiting. She was seen earlier in the day at the [facility] by Dr Harolyn Rutherford as if she was scheduled for D&E, recently diagnosed with missed abortion around 7 weeks. Lab work revealed a potassium of 6.8. she missed her hemodialysis yesterday complaining of diffuse abdominal discomfort. She had also missed her dialysis last Wednesday. she also complains of diffuse abdominal pain that started last Friday, characterized as crampy, associated with multiple episodes of nausea vomiting and diarrhea. she denies fevers, chills, chest pain, but red blood per rectum, hematemesis, melena.  With regard to missed abortion Dr Harolyn Rutherford felt D&E was not urgent and that currant Renal and GI issues should be addressed first. Patient denies vaginal bleed neither does she appear to have significant pelvic pain.   Hospital Course:  1. Hyperkalemia. Patient with history of end-stage renal disease, likely secondary to missing hemodialysis, non compliance  -corrected with HD -Post HD  potassium improved  2. ESRD on HD  -as above   3. Abdominal pain. Patient complaining of abdominal pain since last Friday, associated with nausea, vomiting.  -CT unremarkable, lipase, LFts normal  -Seen by her Ob 6/16 and pain not felt to be due to missed abortion  -called and d/w Dr.Anyanwu again, she feels that her pain is multifactorial and not due to her missed AB  -D&E Scheduled for 12 noon today at [place]  4. History of hypertension  - BP stable   5. Missed Abortion  -[facility]anwu  6. Cocaine/polysubstance abuse -counseled   Discharge Exam: Filed Vitals:   08/31/13 0511  BP: 111/60  Pulse: 91  Temp: 98.7 F (37.1 C)  Resp: 17    General: AAOx3 Cardiovascular: S1S2/RRR Respiratory: CTAB  Discharge Instructions You were cared for by a hospitalist during your hospital stay. If you have any questions about your discharge medications or the care you received while you were in the hospital after you are discharged, you can call the unit and asked to speak with the hospitalist on call if the hospitalist that took care of you is not available. Once you are discharged, your primary care physician will handle any further medical issues. Please note that NO REFILLS for any discharge medications will be authorized once you are discharged, as it is imperative that you return to your primary care physician (or establish a relationship with a primary care physician if you do not have one) for your aftercare needs so that they can reassess your need for medications and monitor your lab values.  Discharge Instructions   Discharge instructions    Complete by:  As directed   NPO, till procedure completed today, then Renal Diet     Increase activity slowly    Complete by:  As directed             Medication List    STOP taking these medications       fluconazole 150 MG tablet  Commonly known as:  DIFLUCAN     metroNIDAZOLE 500 MG tablet   Commonly known as:  FLAGYL      TAKE these medications       albuterol 108 (90 BASE) MCG/ACT inhaler  Commonly known as:  PROVENTIL HFA;VENTOLIN HFA  Inhale 1-2 puffs into the lungs every 6 (six) hours as needed for wheezing or shortness of breath.     calcium acetate 667 MG capsule  Commonly known as:  PHOSLO  Take 2,668 mg by mouth 3 (three) times daily with meals.     cinacalcet 60 MG tablet  Commonly known as:  SENSIPAR  Take 60 mg by mouth daily.     diphenhydrAMINE 25 MG tablet  Commonly known as:  BENADRYL  Take 25 mg by mouth every 6 (six) hours as needed for allergies or sleep.     FOSRENOL 1000 MG chewable tablet  Generic drug:  lanthanum  Chew 1,000 mg by mouth 2 (two) times daily with a meal.     gabapentin 100 MG capsule  Commonly known as:  NEURONTIN  Take 200 mg by mouth at bedtime.     pantoprazole 40 MG tablet  Commonly known as:  PROTONIX  Take 1 tablet (40 mg total) by mouth daily.     promethazine 25 MG tablet  Commonly known as:  PHENERGAN  Take 1 tablet (25 mg total) by mouth every 6 (six) hours as needed for nausea or vomiting.     zolpidem 5 MG tablet  Commonly known as:  AMBIEN  Take 1-2 tablets (5-10 mg total) by mouth at bedtime as needed for sleep.       Allergies  Allergen Reactions  . Morphine And Related Anaphylaxis    Patient says she stops breathing Can take percocet  . Prednisone Other (See Comments)    Patient says prednisone causes her to cramp all over, muscle spasms uncontrolled       Follow-up Information   Follow up with Osborne Oman, MD. (at [facility] for D&E)    Specialty:  Obstetrics and Gynecology   Contact information:   [place] [address] [phone]        The results of significant diagnostics from this hospitalization (including imaging, microbiology, ancillary and laboratory) are listed below for reference.    Significant Diagnostic Studies: US Ob Comp Less  14 Wks  08/08/2013   CLINICAL DATA:  Pain.  EXAM: OBSTETRIC <14 WK [place] AND TRANSVAGINAL OB US  TECHNIQUE: Both transabdominal and transvaginal ultrasound examinations were performed for complete evaluation of the gestation as well as the maternal uterus, adnexal regions, and pelvic cul-de-sac. Transvaginal technique was performed to assess early pregnancy.  COMPARISON:  None.  FINDINGS: Intrauterine gestational sac: Visualized/normal in shape.  Yolk sac:  Present.  Embryo:  Present.  Cardiac Activity: Present.  Heart Rate:  142 bpm  CRL:   1.14 cm 7 w 3 d                  [place] EDC: 01/09/ 2016  Maternal  uterus/adnexae: 3.8 x3.2 x 3.1 cm left ovarian complex cyst most likely hemorrhagic cyst.  IMPRESSION: 1.  Single viable intrauterine pregnancy at 7 weeks 3 days.  2.  Probable left ovarian hemorrhagic cyst.   Electronically Signed   By: Marcello Moores  Register   On: 08/08/2013 20:07   US Ob Transvaginal  08/22/2013   CLINICAL DATA:  Vaginal discharge; end-stage renal disease ; assessment for viability  EXAM: TRANSVAGINAL OB ULTRASOUND  TECHNIQUE: Transvaginal ultrasound was performed for complete evaluation of the gestation as well as the maternal uterus, adnexal regions, and pelvic cul-de-sac.  COMPARISON:  Aug 08, 2013  FINDINGS: Intrauterine gestational sac: Visualized/normal in shape.  Yolk sac:  Not visualized  Embryo:  Visualized  Cardiac Activity: Not visualized  Heart Rate: 0 bpm  CRL:   11  mm   7 w 2 d  Maternal uterus/adnexae: Maternal uterus otherwise appears normal with the exception of a small subchorionic hemorrhage. There is a corpus luteum in the left ovary measuring 4.0 x 2.9 x 2.5 cm, containing a small amount of presumed hemorrhage. Maternal ovaries otherwise appear normal. There is no free maternal fluid.  IMPRESSION: There is a single fetus measuring 11 mm in length without demonstrable fetal cardiac activity. Findings meet definitive criteria for failed pregnancy. This follows SRU consensus  guidelines: Diagnostic Criteria for Nonviable Pregnancy Early in the First Trimester. Alison Stalling J Med [phone].  Small subchorionic hemorrhage.  Hemorrhagic corpus luteum left ovary.   Electronically Signed   By: Lowella Grip M.D.   On: 08/22/2013 10:36   US Ob Transvaginal  08/08/2013   CLINICAL DATA:  Pain.  EXAM: OBSTETRIC <14 WK [place] AND TRANSVAGINAL OB US  TECHNIQUE: Both transabdominal and transvaginal ultrasound examinations were performed for complete evaluation of the gestation as well as the maternal uterus, adnexal regions, and pelvic cul-de-sac. Transvaginal technique was performed to assess early pregnancy.  COMPARISON:  None.  FINDINGS: Intrauterine gestational sac: Visualized/normal in shape.  Yolk sac:  Present.  Embryo:  Present.  Cardiac Activity: Present.  Heart Rate:  142 bpm  CRL:   1.14 cm 7 w 3 d                  [place] EDC: 01/09/ 2016  Maternal uterus/adnexae: 3.8 x3.2 x 3.1 cm left ovarian complex cyst most likely hemorrhagic cyst.  IMPRESSION: 1.  Single viable intrauterine pregnancy at 7 weeks 3 days.  2.  Probable left ovarian hemorrhagic cyst.   Electronically Signed   By: [place]   On: 08/08/2013 20:07   Ct Abdomen Pelvis W Contrast  08/29/2013   CLINICAL DATA:  Abdominal pain.  EXAM: CT ABDOMEN AND PELVIS WITH CONTRAST  TECHNIQUE: Multidetector CT imaging of the abdomen and pelvis was performed using the standard protocol following bolus administration of intravenous contrast.  CONTRAST:  55mL OMNIPAQUE IOHEXOL 300 MG/ML  SOLN  COMPARISON:  None.  FINDINGS: The lung bases demonstrate bibasilar atelectasis. No pericardial effusion.  The solid abdominal organs are unremarkable. There is a large left renal cyst and both kidneys are small.  The stomach, duodenum, small bowel and colon are grossly normal. The appendix is normal. No mesenteric or retroperitoneal mass or adenopathy. The aorta and branch vessels are normal. The major venous structures are patent.  Uterus is  enlarged and the endometrium has thickened. Patient has a recent pregnancy with fetal demise. Probable ongoing abortion. There are bilateral ovarian cysts. No free pelvic fluid collections.  The bony structures are  unremarkable.  IMPRESSION: 1. Enlarged uterus and prominent endometrium due to recent pregnancy. Probable ongoing abortion. 2. Bilateral ovarian cyst. 3. No acute abdominal findings, mass lesions or adenopathy. 4. Small kidneys with history of end-stage renal disease.   Electronically Signed   By: Kalman Jewels M.D.   On: 08/29/2013 18:21    Microbiology: Recent Results (from the past 240 hour(s))  CULTURE, OB URINE     Status: None   Collection Time    08/22/13  9:56 AM      Result Value Ref Range Status   Colony Count >=100,000 COLONIES/ML   Final   Organism ID, Bacteria Multiple bacterial morphotypes present, none   Final   Organism ID, Bacteria predominant. Suggest appropriate recollection if    Final   Organism ID, Bacteria clinically indicated.   Final  WET PREP, GENITAL     Status: Abnormal   Collection Time    08/22/13  9:56 AM      Result Value Ref Range Status   Yeast Wet Prep HPF POC NONE SEEN  NONE SEEN Final   Trich, Wet Prep NONE SEEN  NONE SEEN Final   Clue Cells Wet Prep HPF POC MOD (*) NONE SEEN Final   WBC, Wet Prep HPF POC FEW  NONE SEEN Final     Labs: Basic Metabolic Panel:  Recent Labs Lab 08/29/13 1532 08/29/13 1543 08/29/13 1942 08/29/13 2230 08/30/13 0500 08/30/13 1255 08/31/13 1050  NA 135* 133* 136*  --  135* 141 138  K 7.7* 7.4* 4.5  --  6.3* 4.2 4.7  CL 92* 100 91*  --  92* 96 97  CO2 19  --  17*  --  19 27 29   GLUCOSE 77 76 121*  --  87 99 92  BUN 87* 99* 81*  --  86* 17 25*  CREATININE 19.63* >18.00* 17.76* 18.40* 18.61* 5.80* 9.28*  CALCIUM 9.1  --  8.9  --  8.7 9.4 9.8  PHOS  --   --  6.6*  --   --   --   --    Liver Function Tests:  Recent Labs Lab 08/29/13 1210 08/29/13 1532 08/29/13 1942 08/30/13 0500 08/31/13 1050   AST 6 7  --  6 11  ALT 6 6  --  5 7  ALKPHOS 38* 40  --  36* 37*  BILITOT 0.3 0.3  --  0.2* 0.3  PROT 7.5 7.5  --  6.6 7.1  ALBUMIN 3.2* 3.4* 3.0* 3.0* 3.2*    Recent Labs Lab 08/29/13 2230  LIPASE 51   No results found for this basename: AMMONIA,  in the last 168 hours CBC:  Recent Labs Lab 08/29/13 1210 08/29/13 1532 08/29/13 1543 08/29/13 2230 08/30/13 0500 08/31/13 0800  WBC 13.7* 12.5*  --  10.5 11.2* 7.8  NEUTROABS  --  8.8*  --   --   --   --   HGB 7.9* 8.8* 7.8* 7.2* 7.1* 7.4*  HCT 24.0* 26.6* 23.0* 21.5* 21.3* 22.8*  MCV 95.6 94.7  --  95.1 94.7 97.0  PLT 215 208  --  171 176 177   Cardiac Enzymes: No results found for this basename: CKTOTAL, CKMB, CKMBINDEX, TROPONINI,  in the last 168 hours BNP: BNP (last 3 results) No results found for this basename: PROBNP,  in the last 8760 hours CBG:  Recent Labs Lab 08/29/13 1607 08/29/13 1706 08/30/13 1109  GLUCAP 72 161* 122*       Signed:  Alyanah Elliott  [facility] 08/31/2013, 12:32 PM

## 2013-08-31 NOTE — Anesthesia Postprocedure Evaluation (Signed)
  Anesthesia Post Note  Patient: Desiree [name]  Procedure(s) Performed: Procedure(s) (LRB): DILATATION AND EVACUATION (N/A)  Anesthesia type: MAC  Patient location: PACU  Post pain: Pain level controlled  Post assessment: Post-op Vital signs reviewed  Last Vitals:  Filed Vitals:   08/31/13 1315  BP: 114/69  Pulse: 110  Temp:   Resp: 18    Post vital signs: Reviewed  Level of consciousness: sedated  Complications: No apparent anesthesia complications

## 2013-08-31 NOTE — Progress Notes (Signed)
Heparin SQ not given this AM due to pt's scheduled surgical procedure at [facility] hospital this AM after D/C from [facility].

## 2013-09-01 ENCOUNTER — Encounter (HOSPITAL_COMMUNITY): Payer: Self-pay | Admitting: Obstetrics & Gynecology

## 2013-09-01 LAB — RH IG WORKUP (INCLUDES ABO/RH)
ABO/RH(D): B NEG
Gestational Age(Wks): 7
Unit division: 0

## 2013-09-03 ENCOUNTER — Emergency Department (HOSPITAL_COMMUNITY)
Admission: EM | Admit: 2013-09-03 | Discharge: 2013-09-04 | Disposition: A | Discharge status: Home / Self Care | Payer: Medicare Other | Attending: Emergency Medicine | Admitting: Emergency Medicine

## 2013-09-03 ENCOUNTER — Emergency Department (HOSPITAL_COMMUNITY): Payer: Medicare Other

## 2013-09-03 ENCOUNTER — Encounter (HOSPITAL_COMMUNITY): Payer: Self-pay | Admitting: Emergency Medicine

## 2013-09-03 DIAGNOSIS — Z992 Dependence on renal dialysis: Secondary | ICD-10-CM | POA: Diagnosis not present

## 2013-09-03 DIAGNOSIS — R109 Unspecified abdominal pain: Secondary | ICD-10-CM | POA: Insufficient documentation

## 2013-09-03 DIAGNOSIS — O9989 Other specified diseases and conditions complicating pregnancy, childbirth and the puerperium: Secondary | ICD-10-CM | POA: Insufficient documentation

## 2013-09-03 DIAGNOSIS — Z8751 Personal history of pre-term labor: Secondary | ICD-10-CM | POA: Diagnosis not present

## 2013-09-03 DIAGNOSIS — N186 End stage renal disease: Secondary | ICD-10-CM | POA: Diagnosis not present

## 2013-09-03 DIAGNOSIS — R197 Diarrhea, unspecified: Secondary | ICD-10-CM | POA: Diagnosis not present

## 2013-09-03 DIAGNOSIS — Z79899 Other long term (current) drug therapy: Secondary | ICD-10-CM | POA: Insufficient documentation

## 2013-09-03 DIAGNOSIS — N83209 Unspecified ovarian cyst, unspecified side: Secondary | ICD-10-CM | POA: Diagnosis not present

## 2013-09-03 DIAGNOSIS — M25519 Pain in unspecified shoulder: Secondary | ICD-10-CM | POA: Diagnosis not present

## 2013-09-03 DIAGNOSIS — K219 Gastro-esophageal reflux disease without esophagitis: Secondary | ICD-10-CM | POA: Insufficient documentation

## 2013-09-03 DIAGNOSIS — O169 Unspecified maternal hypertension, unspecified trimester: Secondary | ICD-10-CM | POA: Insufficient documentation

## 2013-09-03 DIAGNOSIS — J45909 Unspecified asthma, uncomplicated: Secondary | ICD-10-CM | POA: Insufficient documentation

## 2013-09-03 LAB — CBC WITH DIFFERENTIAL/PLATELET
BASOS ABS: 0 10*3/uL (ref 0.0–0.1)
Basophils Relative: 0 % (ref 0–1)
Eosinophils Absolute: 0.4 10*3/uL (ref 0.0–0.7)
Eosinophils Relative: 4 % (ref 0–5)
HEMATOCRIT: 21.4 % — AB (ref 36.0–46.0)
Hemoglobin: 7.1 g/dL — ABNORMAL LOW (ref 12.0–15.0)
LYMPHS PCT: 28 % (ref 12–46)
Lymphs Abs: 3.1 10*3/uL (ref 0.7–4.0)
MCH: 31.6 pg (ref 26.0–34.0)
MCHC: 33.2 g/dL (ref 30.0–36.0)
MCV: 95.1 fL (ref 78.0–100.0)
MONO ABS: 0.6 10*3/uL (ref 0.1–1.0)
Monocytes Relative: 6 % (ref 3–12)
NEUTROS ABS: 6.9 10*3/uL (ref 1.7–7.7)
Neutrophils Relative %: 62 % (ref 43–77)
PLATELETS: 226 10*3/uL (ref 150–400)
RBC: 2.25 MIL/uL — ABNORMAL LOW (ref 3.87–5.11)
RDW: 12.9 % (ref 11.5–15.5)
WBC: 11 10*3/uL — AB (ref 4.0–10.5)

## 2013-09-03 LAB — URINE MICROSCOPIC-ADD ON

## 2013-09-03 LAB — BASIC METABOLIC PANEL
BUN: 55 mg/dL — ABNORMAL HIGH (ref 6–23)
CHLORIDE: 96 meq/L (ref 96–112)
CO2: 23 mEq/L (ref 19–32)
Calcium: 9.2 mg/dL (ref 8.4–10.5)
Creatinine, Ser: 12.2 mg/dL — ABNORMAL HIGH (ref 0.50–1.10)
GFR calc non Af Amer: 3 mL/min — ABNORMAL LOW (ref >90)
GFR, EST AFRICAN AMERICAN: 4 mL/min — AB (ref >90)
Glucose, Bld: 96 mg/dL (ref 70–99)
Potassium: 4.2 mEq/L (ref 3.7–5.3)
SODIUM: 137 meq/L (ref 137–147)

## 2013-09-03 LAB — URINALYSIS, ROUTINE W REFLEX MICROSCOPIC
Bilirubin Urine: NEGATIVE
Glucose, UA: 100 mg/dL — AB
Ketones, ur: NEGATIVE mg/dL
NITRITE: NEGATIVE
PROTEIN: 100 mg/dL — AB
Specific Gravity, Urine: 1.009 (ref 1.005–1.030)
Urobilinogen, UA: 0.2 mg/dL (ref 0.0–1.0)
pH: 8.5 — ABNORMAL HIGH (ref 5.0–8.0)

## 2013-09-03 LAB — LIPASE, BLOOD: LIPASE: 59 U/L (ref 11–59)

## 2013-09-03 LAB — POC URINE PREG, ED: Preg Test, Ur: POSITIVE — AB

## 2013-09-03 MED ORDER — HYDROMORPHONE HCL PF 1 MG/ML IJ SOLN
1.0000 mg | Freq: Once | INTRAMUSCULAR | Status: AC
  Administered 2013-09-03: 1 mg via INTRAVENOUS
  Filled 2013-09-03: qty 1

## 2013-09-03 MED ORDER — ONDANSETRON HCL 4 MG/2ML IJ SOLN
4.0000 mg | Freq: Once | INTRAMUSCULAR | Status: AC
  Administered 2013-09-03: 4 mg via INTRAVENOUS
  Filled 2013-09-03: qty 2

## 2013-09-03 NOTE — ED Notes (Signed)
Patient c/o abd pain that onset last night, described as generalized and sharp. Patient had D&C on 08/31/2013. Patient also c/o R arm pain, she describes as unbearable and that she is unable to move it.

## 2013-09-03 NOTE — ED Notes (Signed)
Bed: WA03 Expected date:  Expected time:  Means of arrival:  Comments: 

## 2013-09-04 ENCOUNTER — Emergency Department (HOSPITAL_COMMUNITY): Payer: Medicare Other

## 2013-09-04 DIAGNOSIS — O9989 Other specified diseases and conditions complicating pregnancy, childbirth and the puerperium: Secondary | ICD-10-CM | POA: Diagnosis not present

## 2013-09-04 MED ORDER — HYDROMORPHONE HCL PF 1 MG/ML IJ SOLN
1.0000 mg | Freq: Once | INTRAMUSCULAR | Status: AC
  Administered 2013-09-04: 1 mg via INTRAVENOUS
  Filled 2013-09-04: qty 1

## 2013-09-04 MED ORDER — ONDANSETRON 4 MG PO TBDP: ORAL_TABLET | ORAL | Status: DC

## 2013-09-04 MED ORDER — OXYCODONE-ACETAMINOPHEN 5-325 MG PO TABS: 1.0000 | ORAL_TABLET | ORAL | Status: DC | PRN

## 2013-09-04 NOTE — ED Notes (Signed)
Patient is alert and oriented x3.  She was given DC instructions and follow up visit instructions.  Patient gave verbal understanding. She was DC ambulatory under her own power to home.  V/S stable.  He was not showing any signs of distress on DC 

## 2013-09-04 NOTE — ED Provider Notes (Signed)
Medical screening examination/treatment/procedure(s) were conducted as a shared visit with non-physician practitioner(s) and myself.  I personally evaluated the patient during the encounter.   EKG Interpretation None      Patient presents with worsening right lower quadrant pain for the past couple of days. Recently had a D&C for a missed abortion. Patient's had no fever or chills. Ultrasound demonstrates a hemorrhagic right ovarian cyst. Patient with persistent pain despite multiple doses of pain medication. CT abdomen and pain to rule out any of their calls for patient's symptoms. No acute findings. Patient's symptoms are controlled in the emergency department. She's got to followup with her OB/GYN. Return precautions have been given.  Julianne Rice, MD 09/04/13 [phone]

## 2013-09-04 NOTE — Discharge Instructions (Signed)
Ovarian Cyst An ovarian cyst is a fluid-filled sac that forms on an ovary. The ovaries are small organs that produce eggs in women. Various types of cysts can form on the ovaries. Most are not cancerous. Many do not cause problems, and they often go away on their own. Some may cause symptoms and require treatment. Common types of ovarian cysts include:  Functional cysts--These cysts may occur every month during the menstrual cycle. This is normal. The cysts usually go away with the next menstrual cycle if the woman does not get pregnant. Usually, there are no symptoms with a functional cyst.  Endometrioma cysts--These cysts form from the tissue that lines the uterus. They are also called "chocolate cysts" because they become filled with blood that turns brown. This type of cyst can cause pain in the lower abdomen during intercourse and with your menstrual period.  Cystadenoma cysts--This type develops from the cells on the outside of the ovary. These cysts can get very big and cause lower abdomen pain and pain with intercourse. This type of cyst can twist on itself, cut off its blood supply, and cause severe pain. It can also easily rupture and cause a lot of pain.  Dermoid cysts--This type of cyst is sometimes found in both ovaries. These cysts may contain different kinds of body tissue, such as skin, teeth, hair, or cartilage. They usually do not cause symptoms unless they get very big.  Theca lutein cysts--These cysts occur when too much of a certain hormone (human chorionic gonadotropin) is produced and overstimulates the ovaries to produce an egg. This is most common after procedures used to assist with the conception of a baby (in vitro fertilization). CAUSES   Fertility drugs can cause a condition in which multiple large cysts are formed on the ovaries. This is called ovarian hyperstimulation syndrome.  A condition called polycystic ovary syndrome can cause hormonal imbalances that can lead to  nonfunctional ovarian cysts. SIGNS AND SYMPTOMS  Many ovarian cysts do not cause symptoms. If symptoms are present, they may include:  Pelvic pain or pressure.  Pain in the lower abdomen.  Pain during sexual intercourse.  Increasing girth (swelling) of the abdomen.  Abnormal menstrual periods.  Increasing pain with menstrual periods.  Stopping having menstrual periods without being pregnant. DIAGNOSIS  These cysts are commonly found during a routine or annual pelvic exam. Tests may be ordered to find out more about the cyst. These tests may include:  Ultrasound.  X-ray of the pelvis.  CT scan.  MRI.  Blood tests. TREATMENT  Many ovarian cysts go away on their own without treatment. Your health care provider may want to check your cyst regularly for 2-3 months to see if it changes. For women in menopause, it is particularly important to monitor a cyst closely because of the higher rate of ovarian cancer in menopausal women. When treatment is needed, it may include any of the following:  A procedure to drain the cyst (aspiration). This may be done using a long needle and ultrasound. It can also be done through a laparoscopic procedure. This involves using a thin, lighted tube with a tiny camera on the end (laparoscope) inserted through a small incision.  Surgery to remove the whole cyst. This may be done using laparoscopic surgery or an open surgery involving a larger incision in the lower abdomen.  Hormone treatment or birth control pills. These methods are sometimes used to help dissolve a cyst. HOME CARE INSTRUCTIONS   Only take over-the-counter   or prescription medicines as directed by your health care provider.  Follow up with your health care provider as directed.  Get regular pelvic exams and Pap tests. SEEK MEDICAL CARE IF:   Your periods are late, irregular, or painful, or they stop.  Your pelvic pain or abdominal pain does not go away.  Your abdomen becomes  larger or swollen.  You have pressure on your bladder or trouble emptying your bladder completely.  You have pain during sexual intercourse.  You have feelings of fullness, pressure, or discomfort in your stomach.  You lose weight for no apparent reason.  You feel generally ill.  You become constipated.  You lose your appetite.  You develop acne.  You have an increase in body and facial hair.  You are gaining weight, without changing your exercise and eating habits.  You think you are pregnant. SEEK IMMEDIATE MEDICAL CARE IF:   You have increasing abdominal pain.  You feel sick to your stomach (nauseous), and you throw up (vomit).  You develop a fever that comes on suddenly.  You have abdominal pain during a bowel movement.  Your menstrual periods become heavier than usual. MAKE SURE YOU:  Understand these instructions.  Will watch your condition.  Will get help right away if you are not doing well or get worse. Document Released: 03/02/2005 Document Revised: 03/07/2013 Document Reviewed: 11/07/2012 [employer] Patient Information 2015 [employer]. This information is not intended to replace advice given to you by your health care provider. Make sure you discuss any questions you have with your health care provider.  

## 2013-09-04 NOTE — ED Provider Notes (Signed)
CSN: [id number]     Arrival date & time 09/03/13  2131 History   First MD Initiated Contact with Patient 09/03/13 2236     Chief Complaint  Patient presents with  . Abdominal Pain    generalized sharp pain  . Arm Pain    right   HPI Comments: Patient is a 38 y.o. Female with a history of ESRD on dialysis MWF who presents to the ED with recurrent abdominal pain.  Patient was seen in the ED on 6/16 with similar abdominal pain at that time and was admitted for hyperkalemia and missed abortion.  Patient had a CT scan of the abdomen and pelvis at this time which showed the fetal demise and bilateral ovarian cysts.  No other acute abnormalities were found at that time.  Patient was discharged from the hospital and had a D&C performed on Thursday by Dr. Roselie Awkward and Dr. Penny Pia.  Patient states that she has had no relief from this abdominal pain since then.  She denies any vomiting, constipation, hematochezia, melena, fever, changes in urinary habits, or unusual vaginal discharge.  Patient does endorse passing gas and diarrhea.  She states her pain is generalized and is an 8/10.  She got no relief from her oxycodone.  Pain is described as sharp and achey.  Patient is also complaining of right shoulder pain which is a 8/10.  Pain is described as achey and is associated with the anterior right shoulder over the [facility] joint.  She states she cannot move the arm as it is too painful.  She has not tried any relieving factors for it at this time.  She denies any injury to the shoulder.    Patient is a 38 y.o. female presenting with abdominal pain and arm pain. The history is provided by the patient. No language interpreter was used.  Abdominal Pain Associated symptoms: diarrhea, nausea and vaginal bleeding   Associated symptoms: no chest pain, no chills, no constipation, no cough, no dysuria, no fatigue, no fever, no hematuria, no shortness of breath and no vomiting   Arm Pain Associated symptoms include abdominal  pain, arthralgias and nausea. Pertinent negatives include no chest pain, chills, coughing, fatigue, fever, joint swelling or vomiting.    Past Medical History  Diagnosis Date  . Blood transfusion   . Preterm labor   . Dialysis patient   . Chronic kidney disease   . Hypertension   . Asthma     as a child  . Headache(784.0)   . GERD (gastroesophageal reflux disease)     nexium prn  . Renal insufficiency    Past Surgical History  Procedure Laterality Date  . Renal biopsy    . Dilation and curettage of uterus    . Dilation and evacuation  08/02/2011    Procedure: DILATATION AND EVACUATION;  Surgeon: Logan Bores, MD;  Location: [facility];  Service: Gynecology;;  . Av fistula placement Left 2010  . Revision of arteriovenous goretex graft Left 02/11/2013    Procedure: REVISION OF ARTERIOVENOUS GORTEX FISTULA;  Surgeon: Rosetta Posner, MD;  Location: [facility];  Service: Vascular;  Laterality: Left;  . Dilation and evacuation N/A 08/31/2013    Procedure: DILATATION AND EVACUATION;  Surgeon: Woodroe Mode, MD;  Location: [facility];  Service: Gynecology;  Laterality: N/A;   Family History  Problem Relation Age of Onset  . Hypertension    . Diabetes type II    . Diabetes Mother   . Hyperlipidemia Mother   .  Hypertension Mother   . Heart disease Mother    History  Substance Use Topics  . Smoking status: Passive Smoke Exposure - Never Smoker  . Smokeless tobacco: Never Used  . Alcohol Use: No   OB History   Grav Para Term Preterm Abortions TAB SAB Ect Mult Living   6 1 1  0 4 2 2  0 0 1     Review of Systems  Constitutional: Negative for fever, chills and fatigue.  Respiratory: Negative for cough, chest tightness, shortness of breath and wheezing.   Cardiovascular: Negative for chest pain, palpitations and leg swelling.  Gastrointestinal: Positive for nausea, abdominal pain and diarrhea. Negative for vomiting, constipation, blood in stool and rectal pain.  Genitourinary: Positive for  vaginal bleeding and pelvic pain. Negative for dysuria, urgency, frequency, hematuria, enuresis and difficulty urinating.  Musculoskeletal: Positive for arthralgias. Negative for joint swelling.  All other systems reviewed and are negative.     Allergies  Morphine; Morphine and related; and Prednisone  Home Medications   Prior to Admission medications   Medication Sig Start Date End Date Taking? Authorizing Tanai Bouler  albuterol (PROVENTIL HFA;VENTOLIN HFA) 108 (90 BASE) MCG/ACT inhaler Inhale 1-2 puffs into the lungs every 6 (six) hours as needed for wheezing or shortness of breath.    Historical Mozell Hardacre, MD  calcium acetate (PHOSLO) 667 MG capsule Take 2,668 mg by mouth 3 (three) times daily with meals.     Historical Naren Benally, MD  cinacalcet (SENSIPAR) 60 MG tablet Take 60 mg by mouth daily.    Historical Rochele Lueck, MD  diphenhydrAMINE (BENADRYL) 25 MG tablet Take 25 mg by mouth every 6 (six) hours as needed for allergies or sleep.     Historical Governor Matos, MD  gabapentin (NEURONTIN) 100 MG capsule Take 200 mg by mouth at bedtime.    Historical Gargi Berch, MD  lanthanum (FOSRENOL) 1000 MG chewable tablet Chew 1,000 mg by mouth 2 (two) times daily with a meal.     Historical Lucielle Vokes, MD  oxyCODONE-acetaminophen (PERCOCET/ROXICET) 5-325 MG per tablet Take 1-2 tablets by mouth every 6 (six) hours as needed. 08/31/13   Woodroe Mode, MD  pantoprazole (PROTONIX) 40 MG tablet Take 1 tablet (40 mg total) by mouth daily. 08/31/13   Domenic Polite, MD  promethazine (PHENERGAN) 25 MG tablet Take 1 tablet (25 mg total) by mouth every 6 (six) hours as needed for nausea or vomiting. 08/22/13   Osborne Oman, MD  zolpidem (AMBIEN) 5 MG tablet Take 1-2 tablets (5-10 mg total) by mouth at bedtime as needed for sleep. 08/22/13   Osborne Oman, MD   BP 149/81  Pulse 97  Temp(Src) 98 F (36.7 C) (Oral)  Resp 18  SpO2 100%  LMP 09/04/2013 Physical Exam  Nursing note and vitals reviewed. Constitutional:  She is oriented to person, place, and time. She appears well-developed and well-nourished. No distress.  HENT:  Head: Normocephalic and atraumatic.  Mouth/Throat: Oropharynx is clear and moist. No oropharyngeal exudate.  Eyes: Conjunctivae and EOM are normal. Pupils are equal, round, and reactive to light. No scleral icterus.  Neck: Normal range of motion. Neck supple. No thyromegaly present.  Cardiovascular: Normal rate, regular rhythm, normal heart sounds and intact distal pulses.  Exam reveals no gallop and no friction rub.   No murmur heard. Pulmonary/Chest: Effort normal and breath sounds normal. No respiratory distress. She has no wheezes. She has no rales. She exhibits no tenderness.  Abdominal: Soft. Bowel sounds are normal. She exhibits no distension. There  is generalized tenderness. There is guarding. There is no rigidity, no rebound, no tenderness at McBurney's point and negative Murphy's sign.  Voluntary guarding  Musculoskeletal:  No obvious swelling of the right shoulder.  There is tenderness to palpation over the right AC joint and right biceps tendon.  Arm neurovascularly intact at this time.  Active ROM of the shoulder not demonstrated at this time due to pain  Lymphadenopathy:    She has no cervical adenopathy.  Neurological: She is alert and oriented to person, place, and time.  Skin: Skin is warm and dry. She is not diaphoretic.  Psychiatric: She has a normal mood and affect. Her behavior is normal. Judgment and thought content normal.    ED Course  Procedures (including critical care time) Labs Review Labs Reviewed  BASIC METABOLIC PANEL - Abnormal; Notable for the following:    BUN 55 (*)    Creatinine, Ser 12.20 (*)    GFR calc non Af Amer 3 (*)    GFR calc Af Amer 4 (*)    All other components within normal limits  CBC WITH DIFFERENTIAL - Abnormal; Notable for the following:    WBC 11.0 (*)    RBC 2.25 (*)    Hemoglobin 7.1 (*)    HCT 21.4 (*)    All other  components within normal limits  URINALYSIS, ROUTINE W REFLEX MICROSCOPIC - Abnormal; Notable for the following:    Color, Urine RED (*)    APPearance CLOUDY (*)    pH 8.5 (*)    Glucose, UA 100 (*)    Hgb urine dipstick LARGE (*)    Protein, ur 100 (*)    Leukocytes, UA SMALL (*)    All other components within normal limits  URINE MICROSCOPIC-ADD ON - Abnormal; Notable for the following:    Squamous Epithelial / LPF MANY (*)    Bacteria, UA MANY (*)    All other components within normal limits  POC URINE PREG, ED - Abnormal; Notable for the following:    Preg Test, Ur POSITIVE (*)    All other components within normal limits  LIPASE, BLOOD    Imaging Review No results found.   EKG Interpretation None      MDM   Final diagnoses:  None   Patient presents to the [facility] ED with over 1 week on generalized abdominal pain.  Patient recently CT scanned on 6/16 with evidence of a fetal demise and ovarian cysts bilaterally.  Patient states that she has been passing gas and had a BM at 7:30 today.  Do not suspect bowel obstruction at this time.  Given recent history of D&C pelvic ultrasound was performed here in the ED which showed no acute abnormalities and there is no evidence of retained products of conception.  I have signed the patient out with Dr. Lita Mains at this time.      Kenard Gower, PA-C 09/04/13 [phone]

## 2013-09-05 LAB — COCAINE, URINE, CONFIRMATION: Benzoylecgonine GC/MS Conf: 7651 ng/mL — ABNORMAL HIGH (ref <100)

## 2013-09-08 ENCOUNTER — Encounter (HOSPITAL_COMMUNITY): Payer: Self-pay | Admitting: Emergency Medicine

## 2013-09-08 ENCOUNTER — Observation Stay (HOSPITAL_COMMUNITY)
Admission: EM | Admit: 2013-09-08 | Discharge: 2013-09-09 | Disposition: A | Discharge status: Home / Self Care | Payer: Medicare Other | Attending: Family Medicine | Admitting: Family Medicine

## 2013-09-08 ENCOUNTER — Observation Stay (HOSPITAL_COMMUNITY): Payer: Medicare Other

## 2013-09-08 ENCOUNTER — Emergency Department (HOSPITAL_COMMUNITY): Payer: Medicare Other

## 2013-09-08 DIAGNOSIS — F1410 Cocaine abuse, uncomplicated: Secondary | ICD-10-CM | POA: Diagnosis present

## 2013-09-08 DIAGNOSIS — R079 Chest pain, unspecified: Principal | ICD-10-CM | POA: Diagnosis present

## 2013-09-08 DIAGNOSIS — K219 Gastro-esophageal reflux disease without esophagitis: Secondary | ICD-10-CM | POA: Insufficient documentation

## 2013-09-08 DIAGNOSIS — D649 Anemia, unspecified: Secondary | ICD-10-CM | POA: Diagnosis not present

## 2013-09-08 DIAGNOSIS — R109 Unspecified abdominal pain: Secondary | ICD-10-CM | POA: Diagnosis not present

## 2013-09-08 DIAGNOSIS — Z992 Dependence on renal dialysis: Secondary | ICD-10-CM | POA: Insufficient documentation

## 2013-09-08 DIAGNOSIS — N186 End stage renal disease: Secondary | ICD-10-CM | POA: Diagnosis not present

## 2013-09-08 DIAGNOSIS — R1030 Lower abdominal pain, unspecified: Secondary | ICD-10-CM

## 2013-09-08 DIAGNOSIS — Z79899 Other long term (current) drug therapy: Secondary | ICD-10-CM | POA: Diagnosis not present

## 2013-09-08 DIAGNOSIS — F17200 Nicotine dependence, unspecified, uncomplicated: Secondary | ICD-10-CM | POA: Diagnosis not present

## 2013-09-08 DIAGNOSIS — R110 Nausea: Secondary | ICD-10-CM | POA: Diagnosis not present

## 2013-09-08 DIAGNOSIS — I120 Hypertensive chronic kidney disease with stage 5 chronic kidney disease or end stage renal disease: Secondary | ICD-10-CM | POA: Diagnosis not present

## 2013-09-08 DIAGNOSIS — R0789 Other chest pain: Secondary | ICD-10-CM

## 2013-09-08 DIAGNOSIS — N949 Unspecified condition associated with female genital organs and menstrual cycle: Secondary | ICD-10-CM | POA: Insufficient documentation

## 2013-09-08 DIAGNOSIS — D62 Acute posthemorrhagic anemia: Secondary | ICD-10-CM | POA: Diagnosis present

## 2013-09-08 LAB — PREGNANCY, URINE: PREG TEST UR: POSITIVE — AB

## 2013-09-08 LAB — URINE MICROSCOPIC-ADD ON

## 2013-09-08 LAB — BASIC METABOLIC PANEL
BUN: 21 mg/dL (ref 6–23)
CHLORIDE: 97 meq/L (ref 96–112)
CO2: 32 meq/L (ref 19–32)
CREATININE: 6.26 mg/dL — AB (ref 0.50–1.10)
Calcium: 8.8 mg/dL (ref 8.4–10.5)
GFR calc Af Amer: 9 mL/min — ABNORMAL LOW (ref >90)
GFR calc non Af Amer: 8 mL/min — ABNORMAL LOW (ref >90)
Glucose, Bld: 87 mg/dL (ref 70–99)
Potassium: 3.7 mEq/L (ref 3.7–5.3)
Sodium: 141 mEq/L (ref 137–147)

## 2013-09-08 LAB — I-STAT TROPONIN, ED: TROPONIN I, POC: 0 ng/mL (ref 0.00–0.08)

## 2013-09-08 LAB — CBC
HEMATOCRIT: 21.6 % — AB (ref 36.0–46.0)
HEMOGLOBIN: 7.2 g/dL — AB (ref 12.0–15.0)
MCH: 31 pg (ref 26.0–34.0)
MCHC: 33.3 g/dL (ref 30.0–36.0)
MCV: 93.1 fL (ref 78.0–100.0)
Platelets: 303 10*3/uL (ref 150–400)
RBC: 2.32 MIL/uL — AB (ref 3.87–5.11)
RDW: 13.2 % (ref 11.5–15.5)
WBC: 10 10*3/uL (ref 4.0–10.5)

## 2013-09-08 LAB — RAPID URINE DRUG SCREEN, HOSP PERFORMED
Amphetamines: NOT DETECTED
BARBITURATES: NOT DETECTED
Benzodiazepines: NOT DETECTED
Cocaine: NOT DETECTED
Opiates: NOT DETECTED
TETRAHYDROCANNABINOL: NOT DETECTED

## 2013-09-08 LAB — WET PREP, GENITAL
Clue Cells Wet Prep HPF POC: NONE SEEN
Trich, Wet Prep: NONE SEEN
YEAST WET PREP: NONE SEEN

## 2013-09-08 LAB — URINALYSIS, ROUTINE W REFLEX MICROSCOPIC
BILIRUBIN URINE: NEGATIVE
GLUCOSE, UA: 100 mg/dL — AB
KETONES UR: NEGATIVE mg/dL
Nitrite: NEGATIVE
PH: 8.5 — AB (ref 5.0–8.0)
Protein, ur: 100 mg/dL — AB
Specific Gravity, Urine: 1.007 (ref 1.005–1.030)
Urobilinogen, UA: 0.2 mg/dL (ref 0.0–1.0)

## 2013-09-08 LAB — TROPONIN I: Troponin I: <0.3 ng/mL (ref <0.30)

## 2013-09-08 MED ORDER — HYDROMORPHONE HCL PF 1 MG/ML IJ SOLN
1.0000 mg | Freq: Once | INTRAMUSCULAR | Status: AC
  Administered 2013-09-08: 1 mg via INTRAVENOUS
  Filled 2013-09-08: qty 1

## 2013-09-08 MED ORDER — ONDANSETRON HCL 4 MG PO TABS: 4.0000 mg | ORAL_TABLET | Freq: Four times a day (QID) | ORAL | Status: DC | PRN

## 2013-09-08 MED ORDER — ZOLPIDEM TARTRATE 5 MG PO TABS
5.0000 mg | ORAL_TABLET | Freq: Every evening | ORAL | Status: DC | PRN
  Administered 2013-09-09: 5 mg via ORAL
  Filled 2013-09-08: qty 1

## 2013-09-08 MED ORDER — LANTHANUM CARBONATE 500 MG PO CHEW
1000.0000 mg | CHEWABLE_TABLET | Freq: Two times a day (BID) | ORAL | Status: DC
  Filled 2013-09-08 (×3): qty 2

## 2013-09-08 MED ORDER — PANTOPRAZOLE SODIUM 40 MG IV SOLR
40.0000 mg | Freq: Every day | INTRAVENOUS | Status: DC
  Administered 2013-09-09: 40 mg via INTRAVENOUS
  Filled 2013-09-08 (×2): qty 40

## 2013-09-08 MED ORDER — CALCIUM ACETATE 667 MG PO CAPS
2668.0000 mg | ORAL_CAPSULE | Freq: Three times a day (TID) | ORAL | Status: DC
  Administered 2013-09-09 (×2): 2668 mg via ORAL
  Filled 2013-09-08 (×4): qty 4

## 2013-09-08 MED ORDER — HYDROMORPHONE HCL PF 1 MG/ML IJ SOLN
1.0000 mg | Freq: Four times a day (QID) | INTRAMUSCULAR | Status: DC | PRN
  Administered 2013-09-08 – 2013-09-09 (×4): 1 mg via INTRAVENOUS
  Filled 2013-09-08 (×4): qty 1

## 2013-09-08 MED ORDER — SODIUM CHLORIDE 0.9 % IJ SOLN: 3.0000 mL | Freq: Two times a day (BID) | INTRAMUSCULAR | Status: DC

## 2013-09-08 MED ORDER — SODIUM CHLORIDE 0.9 % IV SOLN
INTRAVENOUS | Status: DC
  Administered 2013-09-08: 125 mL/h via INTRAVENOUS

## 2013-09-08 MED ORDER — ONDANSETRON HCL 4 MG/2ML IJ SOLN
4.0000 mg | Freq: Once | INTRAMUSCULAR | Status: AC
  Administered 2013-09-08: 4 mg via INTRAVENOUS
  Filled 2013-09-08: qty 2

## 2013-09-08 MED ORDER — ONDANSETRON HCL 4 MG/2ML IJ SOLN
4.0000 mg | Freq: Four times a day (QID) | INTRAMUSCULAR | Status: DC | PRN
  Administered 2013-09-08 – 2013-09-09 (×3): 4 mg via INTRAVENOUS
  Filled 2013-09-08 (×3): qty 2

## 2013-09-08 MED ORDER — CINACALCET HCL 30 MG PO TABS
60.0000 mg | ORAL_TABLET | Freq: Every day | ORAL | Status: DC
  Administered 2013-09-09: 60 mg via ORAL
  Filled 2013-09-08 (×2): qty 2

## 2013-09-08 MED ORDER — FERROUS SULFATE 325 (65 FE) MG PO TABS
325.0000 mg | ORAL_TABLET | Freq: Three times a day (TID) | ORAL | Status: DC
  Administered 2013-09-09 (×2): 325 mg via ORAL
  Filled 2013-09-08 (×4): qty 1

## 2013-09-08 NOTE — ED Notes (Signed)
Per pt, went to dialysis today.  After treatment, pt having abdominal and chest pain.  Pt had d/c 1 week ago.  Still bleeding.  Pt was told to come to ED anyway b/c her blood levels were low.  Pt also c/o shortness of breath.  Pt weak last night.

## 2013-09-08 NOTE — ED Notes (Signed)
Went to collect Troponin and hCG - pt has restricted arm and an IV in the other hand.  RN aware that we need to draw labs and we need the IV turned off.  Pt about to go upstairs - main lab phlebotomy notified about the pending labs.

## 2013-09-08 NOTE — H&P (Addendum)
Desiree [name] is an 38 y.o. female.   Chief Complaint: per patient, was in HD unit and began to have chest pains with tightness, SOB, and near syncope. Also complains of continued abdominal pain  HPI: Desiree [name] is a 38 y.o AAF here for complaints of chest tightness, SOB, lightheadedness with near syncope after getting hemodialyzed today.  She gets HD on M, W, F. She says she was also told by the unit clerk there that her H/H was low and she needed to come to the ED for evaluation.  Once she arrives, her main complain is the abdominal pain that she continues to have since her D&C on June 18. She also says she has some vaginal bleeding. She was scheduled for the [facility] on June 16th but when she arrived, she had elevated K+ and it was felt she needed to go to [facility] ED for HD and evaluation first. She stayed in the hospital from June 16-18th. When she was discharged on the 18th, she had her D&C performed. She returned to [facility] ED on 6/21 due to abdominal pain. At that time, transvaginal ultrasound did reveal a  Hemorrhagic cyst as the cause of her pain and she was told to follow back up with her Ob/GYN of which she has not. She says the pain is 8/10 and is constant. Her and her husband are saying that the [facility] messed her up and caused her pain. She says the abdominal pain is the worse when compared to her chest tightness and SOB. She doesn't seem at all worried about this. I did ask her about alcohol and drug use of which she denies. She has had positive UDS with confirmation showing cocaine. I have asked her about this as well, as her chest pain and tightness with tachycardia, very may be related and causing coronary bronchospasm. She denies this and is angry that this is showing in her chart because she says she doesn't do any type of drugs.   Past Medical History  Diagnosis Date  . Blood transfusion   . Preterm labor   . Dialysis patient   . Chronic kidney disease   . Hypertension   . Asthma     as a child  .  Headache(784.0)   . GERD (gastroesophageal reflux disease)     nexium prn  . Renal insufficiency     Past Surgical History  Procedure Laterality Date  . Renal biopsy    . Dilation and curettage of uterus    . Dilation and evacuation  08/02/2011    Procedure: DILATATION AND EVACUATION;  Surgeon: Logan Bores, MD;  Location: [facility];  Service: Gynecology;;  . Av fistula placement Left 2010  . Revision of arteriovenous goretex graft Left 02/11/2013    Procedure: REVISION OF ARTERIOVENOUS GORTEX FISTULA;  Surgeon: Rosetta Posner, MD;  Location: [place] [facility];  Service: Vascular;  Laterality: Left;  . Dilation and evacuation N/A 08/31/2013    Procedure: DILATATION AND EVACUATION;  Surgeon: Woodroe Mode, MD;  Location: [facility];  Service: Gynecology;  Laterality: N/A;    Family History  Problem Relation Age of Onset  . Hypertension    . Diabetes type II    . Diabetes Mother   . Hyperlipidemia Mother   . Hypertension Mother   . Heart disease Mother    Social History:  reports that she has been passively smoking.  She has never used smokeless tobacco. She reports that she uses illicit drugs (Marijuana). She reports  that she does not drink alcohol.  Allergies:  Allergies  Allergen Reactions  . Morphine Shortness Of Breath  . Morphine And Related Anaphylaxis    Patient says she stops breathing Can take percocet  . Prednisone Other (See Comments)    Other reaction(s): Other (See Comments) Muscle spasms Patient says prednisone causes her to cramp all over, muscle spasms uncontrolled     (Not in a hospital admission)  Results for orders placed during the hospital encounter of 09/08/13 (from the past 48 hour(s))  WET PREP, GENITAL     Status: Abnormal   Collection Time    09/08/13  6:35 PM      Result Value Ref Range   Yeast Wet Prep HPF POC NONE SEEN  NONE SEEN   Trich, Wet Prep NONE SEEN  NONE SEEN   Clue Cells Wet Prep HPF POC NONE SEEN  NONE SEEN   WBC, Wet Prep HPF POC RARE (*)  NONE SEEN  TYPE AND SCREEN     Status: None   Collection Time    09/08/13  6:36 PM      Result Value Ref Range   ABO/RH(D) B NEG     Antibody Screen POS     Sample Expiration 09/11/2013     Antibody Identification ANTI D    CBC     Status: Abnormal   Collection Time    09/08/13  6:40 PM      Result Value Ref Range   WBC 10.0  4.0 - 10.5 K/uL   RBC 2.32 (*) 3.87 - 5.11 MIL/uL   Hemoglobin 7.2 (*) 12.0 - 15.0 g/dL   HCT 21.6 (*) 36.0 - 46.0 %   MCV 93.1  78.0 - 100.0 fL   MCH 31.0  26.0 - 34.0 pg   MCHC 33.3  30.0 - 36.0 g/dL   RDW 13.2  11.5 - 15.5 %   Platelets 303  150 - 400 K/uL  TROPONIN I     Status: None   Collection Time    09/08/13  6:40 PM      Result Value Ref Range   Troponin I <0.30  <0.30 ng/mL   Comment:            Due to the release kinetics of cTnI,     a negative result within the first hours     of the onset of symptoms does not rule out     myocardial infarction with certainty.     If myocardial infarction is still suspected,     repeat the test at appropriate intervals.  BASIC METABOLIC PANEL     Status: Abnormal   Collection Time    09/08/13  6:40 PM      Result Value Ref Range   Sodium 141  137 - 147 mEq/L   Potassium 3.7  3.7 - 5.3 mEq/L   Chloride 97  96 - 112 mEq/L   CO2 32  19 - 32 mEq/L   Glucose, Bld 87  70 - 99 mg/dL   BUN 21  6 - 23 mg/dL   Creatinine, Ser 6.26 (*) 0.50 - 1.10 mg/dL   Calcium 8.8  8.4 - 10.5 mg/dL   GFR calc non Af Amer 8 (*) >90 mL/min   GFR calc Af Amer 9 (*) >90 mL/min   Comment: (NOTE)     The eGFR has been calculated using the CKD EPI equation.     This calculation has not been validated in all clinical  situations.     eGFR's persistently <90 mL/min signify possible Chronic Kidney     Disease.  Randolm Idol, ED     Status: None   Collection Time    09/08/13  6:47 PM      Result Value Ref Range   Troponin i, poc 0.00  0.00 - 0.08 ng/mL   Comment 3            Comment: Due to the release kinetics of cTnI,      a negative result within the first hours     of the onset of symptoms does not rule out     myocardial infarction with certainty.     If myocardial infarction is still suspected,     repeat the test at appropriate intervals.   Dg Chest Portable 1 View  09/08/2013   CLINICAL DATA:  Chest pain.  EXAM: PORTABLE CHEST - 1 VIEW  COMPARISON:  September 10, 2012.  FINDINGS: The heart size and mediastinal contours are within normal limits. Both lungs are clear. No pneumothorax or pleural effusion is noted. The visualized skeletal structures are unremarkable.  IMPRESSION: No acute cardiopulmonary abnormality seen.   Electronically Signed   By: Sabino Dick M.D.   On: 09/08/2013 19:44    Review of Systems  Constitutional: Negative for fever, chills, weight loss, malaise/fatigue and diaphoresis.  HENT: Negative for hearing loss and tinnitus.   Eyes: Negative for blurred vision and photophobia.  Respiratory: Positive for shortness of breath. Negative for cough, hemoptysis, sputum production and wheezing.   Cardiovascular: Positive for chest pain. Negative for palpitations, orthopnea, claudication, leg swelling and PND.  Gastrointestinal: Positive for abdominal pain. Negative for heartburn, nausea, vomiting, diarrhea, constipation, blood in stool and melena.  Genitourinary: Negative for dysuria and frequency.       Vaginal bleeding   Musculoskeletal: Negative for back pain, joint pain, myalgias and neck pain.  Skin: Negative for itching and rash.  Neurological: Negative for dizziness, tingling, tremors, sensory change and headaches.  Endo/Heme/Allergies: Negative for polydipsia. Does not bruise/bleed easily.  Psychiatric/Behavioral: Positive for substance abuse. Negative for depression and hallucinations.       Per chart review, positive UDS with cocaine, patient denies this    Blood pressure 113/61, pulse 89, temperature 99.4 F (37.4 C), temperature source Oral, resp. rate 14, last menstrual period  09/04/2013, SpO2 99.00%, unknown if currently breastfeeding. Physical Exam  Nursing note and vitals reviewed. Constitutional: She is oriented to person, place, and time. She appears well-developed and well-nourished.  HENT:  Head: Normocephalic and atraumatic.  Right Ear: External ear normal.  Left Ear: External ear normal.  Nose: Nose normal.  Mouth/Throat: Oropharynx is clear and moist.  Eyes: Conjunctivae are normal. Pupils are equal, round, and reactive to light.  Neck: Normal range of motion. Neck supple.  Cardiovascular: Regular rhythm and normal heart sounds.   tachycardia  Respiratory: Effort normal and breath sounds normal. No respiratory distress. She has no wheezes. She has no rales.  GI: Soft. Bowel sounds are normal. She exhibits no distension. There is no tenderness. There is no rebound.  Genitourinary:  Deferred, ED MD already performed pelvic exam  Neurological: She is alert and oriented to person, place, and time.  Skin: Skin is warm and dry.  Psychiatric: She has a normal mood and affect. Her behavior is normal. Thought content normal.     Assessment/Plan  Diagnosis  Chest pain - etiology unknown. Have placed on telemetry, oxygen and continued troponins. She  was told her H/H was low and needing transfusion. She is chronic in nature and have been typed and screened. Will hold off on transfusion right now and place on iron supplements as her HGB isn't below 7.    Abdominal pain - she does have hemorrhagic cyst seen on U/S on 6/21 and this very well could be the cause of her pain. Have given her Dilaudid for her pain and have ordered serum qualitative HCG along with another transvaginal ultrasound to evaluate for any changes since her pain is worsened. She did have an ultrasound on the 21st but also showed a positive urine pregnancy test as well on that same date, despite having the [facility] on June 18th? I have ordered a qualitative beta HCG and will await this lab before  proceeding with the transvaginal. She had wet prep done in ED which showed rare WBC. She also had GC/Chlamydia done and will need to follow up on this.    ESRD -dialyzed M,W,F. Continued chronic home medications.   Anemia -of chronic disease and stable. No transfusion as of yet. Will follow and monitor H/H.    Cocaine abuse - she has had positive UDS and had denied the use of this despite confirmation being sent. I have ordered UDS today and if positive, this may be the cause of her chest pains causing coronary bronchospasm. To also follow up troponins.     Full code  Sandi Mealy 09/08/2013, 10:31 PM

## 2013-09-08 NOTE — ED Provider Notes (Signed)
CSN: [id number]     Arrival date & time 09/08/13  1632 History   First MD Initiated Contact with Patient 09/08/13 1724     Chief Complaint  Patient presents with  . Chest Pain     (Consider location/radiation/quality/duration/timing/severity/associated sxs/prior Treatment) Patient is a 38 y.o. female presenting with chest pain.  Chest Pain Associated symptoms: abdominal pain and nausea    She complains of low bowel pain onset approximately 1 week ago after having had a D&C. She also complains of nausea. She's had chest tightness since today. Recovery by shortness of breath. Presently no chest discomfort she does complain of low bowel discomfort severe. Patient reports that she last had hemodialysis today. She missed the last 45 minutes of dialysis because of a low of dominant pain. She reports fever of 101.5 which defervesced 2 days ago. Last bowel movement yesterday, diarrhea. Patient suffers from chronic diarrhea. Bowel movements unchanged for her. Treated with hydrocodone, without adequate pain relief. He reports her hemoglobin today at hemodialysis was 6.5. Denies blood per rectum. She does continue to have vaginal bleeding since her D&C . Patient also reports that she had a near syncopal event yesterday. Past Medical History  Diagnosis Date  . Blood transfusion   . Preterm labor   . Dialysis patient   . Chronic kidney disease   . Hypertension   . Asthma     as a child  . Headache(784.0)   . GERD (gastroesophageal reflux disease)     nexium prn  . Renal insufficiency    Past Surgical History  Procedure Laterality Date  . Renal biopsy    . Dilation and curettage of uterus    . Dilation and evacuation  08/02/2011    Procedure: DILATATION AND EVACUATION;  Surgeon: Logan Bores, MD;  Location: [facility];  Service: Gynecology;;  . Av fistula placement Left 2010  . Revision of arteriovenous goretex graft Left 02/11/2013    Procedure: REVISION OF ARTERIOVENOUS GORTEX FISTULA;   Surgeon: Rosetta Posner, MD;  Location: [facility];  Service: Vascular;  Laterality: Left;  . Dilation and evacuation N/A 08/31/2013    Procedure: DILATATION AND EVACUATION;  Surgeon: Woodroe Mode, MD;  Location: [facility];  Service: Gynecology;  Laterality: N/A;   Family History  Problem Relation Age of Onset  . Hypertension    . Diabetes type II    . Diabetes Mother   . Hyperlipidemia Mother   . Hypertension Mother   . Heart disease Mother    History  Substance Use Topics  . Smoking status: Passive Smoke Exposure - Never Smoker  . Smokeless tobacco: Never Used  . Alcohol Use: No   OB History   Grav Para Term Preterm Abortions TAB SAB Ect Mult Living   6 1 1  0 4 2 2  0 0 1     Review of Systems  Cardiovascular: Positive for chest pain.  Gastrointestinal: Positive for nausea and abdominal pain.  Genitourinary:       Next minimal amount of urine  All other systems reviewed and are negative.     Allergies  Morphine; Morphine and related; and Prednisone  Home Medications   Prior to Admission medications   Medication Sig Start Date End Date Taking? Authorizing Flower Franko  calcium acetate (PHOSLO) 667 MG capsule Take 2,668 mg by mouth 3 (three) times daily with meals.    Yes Historical Bo Teicher, MD  cinacalcet (SENSIPAR) 60 MG tablet Take 60 mg by mouth daily.   Yes  Historical Janele Lague, MD  diphenhydrAMINE (BENADRYL) 25 MG tablet Take 25 mg by mouth every 6 (six) hours as needed for allergies or sleep.    Yes Historical Mililani Murthy, MD  gabapentin (NEURONTIN) 100 MG capsule Take 200 mg by mouth at bedtime.   Yes Historical Saivon Prowse, MD  lanthanum (FOSRENOL) 1000 MG chewable tablet Chew 1,000 mg by mouth 2 (two) times daily with a meal.    Yes Historical Nakita Santerre, MD  ondansetron (ZOFRAN ODT) 4 MG disintegrating tablet 4mg  ODT q4 hours prn nausea/vomit 09/04/13  Yes Julianne Rice, MD  pantoprazole (PROTONIX) 40 MG tablet Take 1 tablet (40 mg total) by mouth daily. 08/31/13  Yes Domenic Polite, MD  promethazine (PHENERGAN) 25 MG tablet Take 1 tablet (25 mg total) by mouth every 6 (six) hours as needed for nausea or vomiting. 08/22/13  Yes Osborne Oman, MD  zolpidem (AMBIEN) 5 MG tablet Take 1-2 tablets (5-10 mg total) by mouth at bedtime as needed for sleep. 08/22/13  Yes Osborne Oman, MD   BP 111/64  Temp(Src) 99.4 F (37.4 C) (Oral)  Resp 20  SpO2 100%  LMP 09/04/2013 Physical Exam  Nursing note and vitals reviewed. Constitutional: She appears well-developed and well-nourished.  HENT:  Head: Normocephalic and atraumatic.  Eyes: Conjunctivae are normal. Pupils are equal, round, and reactive to light.  Neck: Neck supple. No tracheal deviation present. No thyromegaly present.  Cardiovascular: Normal rate and regular rhythm.   No murmur heard. Pulmonary/Chest: Effort normal and breath sounds normal.  Abdominal: Soft. Bowel sounds are normal. She exhibits no distension. There is no tenderness.  Obese tender at the infraumbilical area  Genitourinary:  No external lesion, minimal brownish disharge in vault no active bleeding. positivwe  Cervical motion tederness , bilateral adnexal tenderness  Musculoskeletal: Normal range of motion. She exhibits no edema and no tenderness.  Dialysis fistula at left upper arm with good thrill  Neurological: She is alert. Coordination normal.  Skin: Skin is warm and dry. No rash noted.  Psychiatric: She has a normal mood and affect.    ED Course  Procedures (including critical care time) Labs Review Labs Reviewed - No data to display  Imaging Review No results found.   EKG Interpretation None     8 p.m. reports no pain relief after treatment with hydromorphone intravenously. Results for orders placed during the hospital encounter of 09/08/13  WET PREP, GENITAL      Result Value Ref Range   Yeast Wet Prep HPF POC NONE SEEN  NONE SEEN   Trich, Wet Prep NONE SEEN  NONE SEEN   Clue Cells Wet Prep HPF POC NONE SEEN  NONE  SEEN   WBC, Wet Prep HPF POC RARE (*) NONE SEEN  CBC      Result Value Ref Range   WBC 10.0  4.0 - 10.5 K/uL   RBC 2.32 (*) 3.87 - 5.11 MIL/uL   Hemoglobin 7.2 (*) 12.0 - 15.0 g/dL   HCT 21.6 (*) 36.0 - 46.0 %   MCV 93.1  78.0 - 100.0 fL   MCH 31.0  26.0 - 34.0 pg   MCHC 33.3  30.0 - 36.0 g/dL   RDW 13.2  11.5 - 15.5 %   Platelets 303  150 - 400 K/uL  TROPONIN I      Result Value Ref Range   Troponin I <0.30  <0.30 ng/mL  BASIC METABOLIC PANEL      Result Value Ref Range   Sodium 141  137 - 147 mEq/L   Potassium 3.7  3.7 - 5.3 mEq/L   Chloride 97  96 - 112 mEq/L   CO2 32  19 - 32 mEq/L   Glucose, Bld 87  70 - 99 mg/dL   BUN 21  6 - 23 mg/dL   Creatinine, Ser 6.26 (*) 0.50 - 1.10 mg/dL   Calcium 8.8  8.4 - 10.5 mg/dL   GFR calc non Af Amer 8 (*) >90 mL/min   GFR calc Af Amer 9 (*) >90 mL/min  I-STAT TROPOININ, ED      Result Value Ref Range   Troponin i, poc 0.00  0.00 - 0.08 ng/mL   Comment 3           TYPE AND SCREEN      Result Value Ref Range   ABO/RH(D) PENDING     Antibody Screen PENDING     Sample Expiration 09/11/2013     Ct Abdomen Pelvis Wo Contrast  09/04/2013   CLINICAL DATA:  Sharp right abdominal pain. Patient had D and C 08/31/2013 after missed abortion 08/29/2013. White cell count 11. Microhematuria.  EXAM: CT ABDOMEN AND PELVIS WITHOUT CONTRAST  TECHNIQUE: Multidetector CT imaging of the abdomen and pelvis was performed following the standard protocol without IV contrast.  COMPARISON:  08/29/2013  FINDINGS: The lung bases are clear.  Large cyst in the lower pole left kidney measuring 5.8 cm diameter as seen on prior study. Bilateral renal parenchymal atrophy. No renal, ureteral, or bladder stone is identified. No bladder wall thickening. No hydronephrosis or hydroureter.  The unenhanced appearance of the liver, spleen, gallbladder, pancreas, adrenal glands, abdominal aorta, inferior vena cava, and retroperitoneal lymph nodes is unremarkable. The stomach, small  bowel, and colon are not abnormally distended. No free air or free fluid in the abdomen.  Pelvis: Prominent enlargement of the uterus consistent with recent postpartum state. Uterus appears to be decreasing in size since prior study. Cystic changes in the ovaries bilaterally are better demonstrated on prior CT and ultrasound. No definite evidence of any free or loculated pelvic fluid collection otherwise. There is no evidence of diverticulitis. Increased density in the appendix may represent residual contrast material. There is no appendiceal dilatation or inflammatory change. No destructive bone lesions.  IMPRESSION: No acute changes identified since prior study, allowing for differences in technique. Again demonstrated is bilateral renal parenchymal atrophy with large cyst in the left kidney. No evidence of any renal or ureteral stone or obstruction. Enlarged uterus consistent with postpartum state. Bilateral ovarian cysts are better demonstrated on previous imaging.   Electronically Signed   By: Lucienne Capers M.D.   On: 09/04/2013 03:34   Dg Shoulder Right  09/04/2013   CLINICAL DATA:  Right shoulder pain upon awakening.  EXAM: RIGHT SHOULDER - 2+ VIEW  COMPARISON:  None.  FINDINGS: No acute bony findings. Old healed right rib fractures. No dislocation. A cause for the patient's Shoulder pain is not readily apparent.  IMPRESSION: Negative.   Electronically Signed   By: Sherryl Barters M.D.   On: 09/04/2013 00:59   US Ob Transvaginal  09/04/2013   CLINICAL DATA:  Abdominal pain after D and C 08/31/13. Positive urine pregnancy test. Quantitative beta HCG is not available.  EXAM: TRANSVAGINAL OB ULTRASOUND  TECHNIQUE: Transvaginal ultrasound was performed for complete evaluation of the gestation as well as the maternal uterus, adnexal regions, and pelvic cul-de-sac.  COMPARISON:  CT abdomen and pelvis 08/29/2013. Ultrasound 08/22/2013.  FINDINGS: Intrauterine gestational sac:  No intrauterine pregnancy is  demonstrated.  Yolk sac:  Not demonstrated.  Embryo:  Not demonstrated.  Cardiac Activity: Not demonstrated.  Maternal uterus/adnexae: The uterus is slightly retroverted, measuring 12.8 x 7 x 7.3 cm. Normal homogeneous appearance of the endometrial stripe with thickness measured at 9.2 mm.  Right ovary measures 6.9 x 5.1 x 8 cm and contains multiple cysts including a dominant cyst measuring 4.9 x 3.6 x 3 cm. This dominant cyst is multiple internal echoes and likely represents a hemorrhagic cyst. The dominant cysts larger than on the prior study and hemorrhage is new.  Left ovary measures 5.3 x 4.8 x 4.4 cm. Normal follicular changes are demonstrated.  Small amount of free fluid in the pelvis.  IMPRESSION: No residual intrauterine pregnancy is demonstrated. Endometrial stripe thickness is normal. Dominant hemorrhagic appearing cyst in the right ovary. Small amount of free fluid in the pelvis.   Electronically Signed   By: Lucienne Capers M.D.   On: 09/04/2013 00:56   US Ob Transvaginal  08/22/2013   CLINICAL DATA:  Vaginal discharge; end-stage renal disease ; assessment for viability  EXAM: TRANSVAGINAL OB ULTRASOUND  TECHNIQUE: Transvaginal ultrasound was performed for complete evaluation of the gestation as well as the maternal uterus, adnexal regions, and pelvic cul-de-sac.  COMPARISON:  Aug 08, 2013  FINDINGS: Intrauterine gestational sac: Visualized/normal in shape.  Yolk sac:  Not visualized  Embryo:  Visualized  Cardiac Activity: Not visualized  Heart Rate: 0 bpm  CRL:   11  mm   7 w 2 d  Maternal uterus/adnexae: Maternal uterus otherwise appears normal with the exception of a small subchorionic hemorrhage. There is a corpus luteum in the left ovary measuring 4.0 x 2.9 x 2.5 cm, containing a small amount of presumed hemorrhage. Maternal ovaries otherwise appear normal. There is no free maternal fluid.  IMPRESSION: There is a single fetus measuring 11 mm in length without demonstrable fetal cardiac activity.  Findings meet definitive criteria for failed pregnancy. This follows SRU consensus guidelines: Diagnostic Criteria for Nonviable Pregnancy Early in the First Trimester. Alison Stalling J Med [id number].  Small subchorionic hemorrhage.  Hemorrhagic corpus luteum left ovary.   Electronically Signed   By: Lowella Grip M.D.   On: 08/22/2013 10:36   Ct Abdomen Pelvis W Contrast  08/29/2013   CLINICAL DATA:  Abdominal pain.  EXAM: CT ABDOMEN AND PELVIS WITH CONTRAST  TECHNIQUE: Multidetector CT imaging of the abdomen and pelvis was performed using the standard protocol following bolus administration of intravenous contrast.  CONTRAST:  36mL OMNIPAQUE IOHEXOL 300 MG/ML  SOLN  COMPARISON:  None.  FINDINGS: The lung bases demonstrate bibasilar atelectasis. No pericardial effusion.  The solid abdominal organs are unremarkable. There is a large left renal cyst and both kidneys are small.  The stomach, duodenum, small bowel and colon are grossly normal. The appendix is normal. No mesenteric or retroperitoneal mass or adenopathy. The aorta and branch vessels are normal. The major venous structures are patent.  Uterus is enlarged and the endometrium has thickened. Patient has a recent pregnancy with fetal demise. Probable ongoing abortion. There are bilateral ovarian cysts. No free pelvic fluid collections.  The bony structures are unremarkable.  IMPRESSION: 1. Enlarged uterus and prominent endometrium due to recent pregnancy. Probable ongoing abortion. 2. Bilateral ovarian cyst. 3. No acute abdominal findings, mass lesions or adenopathy. 4. Small kidneys with history of end-stage renal disease.   Electronically Signed   By: Kalman Jewels M.D.   On: 08/29/2013  18:21   Dg Chest Portable 1 View  09/08/2013   CLINICAL DATA:  Chest pain.  EXAM: PORTABLE CHEST - 1 VIEW  COMPARISON:  September 10, 2012.  FINDINGS: The heart size and mediastinal contours are within normal limits. Both lungs are clear. No pneumothorax or pleural  effusion is noted. The visualized skeletal structures are unremarkable.  IMPRESSION: No acute cardiopulmonary abnormality seen.   Electronically Signed   By: Sabino Dick M.D.   On: 09/08/2013 19:44    MDM  Spoke with Dr.Barrino in light of chest pain, near syncope, patient will be placed in 23 hour observation status Diagnosis #1 chest pain #2 near syncope #3 abdominal pain #4 anemia 5 pelvic pain Final diagnoses:  None        Orlie Dakin, MD 09/08/13 2034

## 2013-09-08 NOTE — ED Notes (Signed)
Pt. Reminded of urinalysis, verbalized understanding.

## 2013-09-08 NOTE — ED Notes (Signed)
Attempted I/O. Couldn't obtain urine and Pt C/O of being in pain and wouldn't let me finish procedure

## 2013-09-09 ENCOUNTER — Observation Stay (HOSPITAL_COMMUNITY): Payer: Medicare Other

## 2013-09-09 DIAGNOSIS — R0789 Other chest pain: Secondary | ICD-10-CM

## 2013-09-09 DIAGNOSIS — N186 End stage renal disease: Secondary | ICD-10-CM

## 2013-09-09 DIAGNOSIS — F1410 Cocaine abuse, uncomplicated: Secondary | ICD-10-CM

## 2013-09-09 DIAGNOSIS — R109 Unspecified abdominal pain: Secondary | ICD-10-CM

## 2013-09-09 DIAGNOSIS — R079 Chest pain, unspecified: Secondary | ICD-10-CM

## 2013-09-09 LAB — CBC
HEMATOCRIT: 21.7 % — AB (ref 36.0–46.0)
HEMOGLOBIN: 7.2 g/dL — AB (ref 12.0–15.0)
MCH: 31.4 pg (ref 26.0–34.0)
MCHC: 33.2 g/dL (ref 30.0–36.0)
MCV: 94.8 fL (ref 78.0–100.0)
Platelets: 300 10*3/uL (ref 150–400)
RBC: 2.29 MIL/uL — AB (ref 3.87–5.11)
RDW: 13.3 % (ref 11.5–15.5)
WBC: 10.9 10*3/uL — ABNORMAL HIGH (ref 4.0–10.5)

## 2013-09-09 LAB — COMPREHENSIVE METABOLIC PANEL
ALT: 6 U/L (ref 0–35)
AST: 7 U/L (ref 0–37)
Albumin: 3.2 g/dL — ABNORMAL LOW (ref 3.5–5.2)
Alkaline Phosphatase: 33 U/L — ABNORMAL LOW (ref 39–117)
BUN: 25 mg/dL — AB (ref 6–23)
CALCIUM: 8.8 mg/dL (ref 8.4–10.5)
CO2: 30 mEq/L (ref 19–32)
Chloride: 94 mEq/L — ABNORMAL LOW (ref 96–112)
Creatinine, Ser: 7.3 mg/dL — ABNORMAL HIGH (ref 0.50–1.10)
GFR calc Af Amer: 7 mL/min — ABNORMAL LOW (ref >90)
GFR, EST NON AFRICAN AMERICAN: 6 mL/min — AB (ref >90)
GLUCOSE: 106 mg/dL — AB (ref 70–99)
Potassium: 3.7 mEq/L (ref 3.7–5.3)
Sodium: 138 mEq/L (ref 137–147)
TOTAL PROTEIN: 7 g/dL (ref 6.0–8.3)
Total Bilirubin: <0.2 mg/dL — ABNORMAL LOW (ref 0.3–1.2)

## 2013-09-09 LAB — TROPONIN I

## 2013-09-09 LAB — GC/CHLAMYDIA PROBE AMP
CT Probe RNA: NEGATIVE
GC Probe RNA: NEGATIVE

## 2013-09-09 LAB — HCG, QUANTITATIVE, PREGNANCY: HCG, BETA CHAIN, QUANT, S: 6792 m[IU]/mL — AB (ref <5)

## 2013-09-09 MED ORDER — DIPHENHYDRAMINE HCL 25 MG PO CAPS: 25.0000 mg | ORAL_CAPSULE | Freq: Four times a day (QID) | ORAL | Status: DC | PRN

## 2013-09-09 MED ORDER — PANTOPRAZOLE SODIUM 40 MG PO TBEC: 40.0000 mg | DELAYED_RELEASE_TABLET | Freq: Every day | ORAL | Status: DC

## 2013-09-09 MED ORDER — ONDANSETRON HCL 4 MG PO TABS: 4.0000 mg | ORAL_TABLET | Freq: Three times a day (TID) | ORAL | Status: DC | PRN

## 2013-09-09 MED ORDER — OXYCODONE-ACETAMINOPHEN 5-325 MG PO TABS: 1.0000 | ORAL_TABLET | ORAL | Status: DC | PRN

## 2013-09-09 MED ORDER — DOCUSATE SODIUM 100 MG PO CAPS: 100.0000 mg | ORAL_CAPSULE | Freq: Two times a day (BID) | ORAL | Status: DC

## 2013-09-09 MED ORDER — FERROUS SULFATE 325 (65 FE) MG PO TABS: 325.0000 mg | ORAL_TABLET | Freq: Three times a day (TID) | ORAL | Status: DC

## 2013-09-09 MED ORDER — GABAPENTIN 100 MG PO CAPS
200.0000 mg | ORAL_CAPSULE | Freq: Every day | ORAL | Status: DC
  Administered 2013-09-09: 200 mg via ORAL
  Filled 2013-09-09 (×2): qty 2

## 2013-09-09 NOTE — Discharge Summary (Signed)
Physician Discharge Summary  Desiree [name] [id number] DOB: [birth year] DOA: 09/08/2013  PCP: Windy Kalata, MD  Admit date: 09/08/2013 Discharge date: 09/09/2013  Time spent: 35 minutes  Recommendations for Outpatient Follow-up:  1. Follow up with PCP in 1-2 weeks 2. Follow up for scheduled dialysis 3. Follow up with Gyn to be scheduled by office  Discharge Diagnoses:  Active Problems:   Anemia   ESRD (end stage renal disease)   Cocaine abuse   Abdominal pain   Chest pain   Discharge Condition: Stable  Diet recommendation: Renal  Filed Weights   09/08/13 2356  Weight: 178 lb 5.6 oz (80.9 kg)    History of present illness:  Desiree [name] is a 38 y.o AAF here for complaints of chest tightness, SOB, lightheadedness with near syncope after getting hemodialyzed today. She gets HD on M, W, F. She says she was also told by the unit clerk there that her H/H was low and she needed to come to the ED for evaluation.  Once she arrives, her main complain is the abdominal pain that she continues to have since her D&C on June 18. She also says she has some vaginal bleeding. She was scheduled for the [facility] on June 16th but when she arrived, she had elevated K+ and it was felt she needed to go to [facility] ED for HD and evaluation first. She stayed in the hospital from June 16-18th. When she was discharged on the 18th, she had her D&C performed. She returned to [facility] ED on 6/21 due to abdominal pain. At that time, transvaginal ultrasound did reveal a Hemorrhagic cyst as the cause of her pain and she was told to follow back up with her Ob/GYN of which she has not. She says the pain is 8/10 and is constant. Her and her husband are saying that the [facility] messed her up and caused her pain. She says the abdominal pain is the worse when compared to her chest tightness and SOB. She doesn't seem at all worried about this. I did ask her about alcohol and drug use of which she denies. She has had positive UDS with  confirmation showing cocaine. I have asked her about this as well, as her chest pain and tightness with tachycardia, very may be related and causing coronary bronchospasm. She denies this and is angry that this is showing in her chart because she says she doesn't do any type of drugs.   Hospital Course:  Chest pain  - Serial cardiac enzymes neg. Remained stable on tele. Per patient, she never really had chest pain and her presenting issues mainly stemmed around her chronic abd pain post d&c (see below)  Abd pain - Pelvic US on 6/27 with findings of ovarian cysts that have been present before with a thickened uterus (s/p d and c). Discussed the case with the on-call GYN provider who recommends d/c with pain medications and close follow up in the clinic. Per on-call gyn provider, the office will notify pt of appointment. For now, will prescribe narcotics for pain control.  ESRD  -dialyzed M,W,F. Continued chronic home medications.   Anemia  -of chronic disease and stable. No transfusion as of yet.  -Will discharge with iron with stool softener   Hx of Cocaine abuse  - she has had positive UDS in the past, UDS on admit neg for cocaine  Consultations:  Discussed case with on-call Gyn provider over phone  Discharge Exam: Filed Vitals:   09/08/13 1942 09/08/13 2356  09/09/13 0530 09/09/13 1355  BP: 113/61 130/81 118/71 120/62  Pulse: 89 89 87 91  Temp:  98.7 F (37.1 C) 98.7 F (37.1 C) 98.6 F (37 C)  TempSrc:  Oral Oral Oral  Resp: 14 18 18 20   Height:  5\' 3"  (1.6 m)    Weight:  178 lb 5.6 oz (80.9 kg)    SpO2: 99% 100% 98% 100%    General: awake, in nad Cardiovascular: regular, s1, s2 Respiratory: normal resp effort, no wheezing  Discharge Instructions     Medication List         calcium acetate 667 MG capsule  Commonly known as:  PHOSLO  Take 2,668 mg by mouth 3 (three) times daily with meals.     cinacalcet 60 MG tablet  Commonly known as:  SENSIPAR  Take 60 mg  by mouth daily.     diphenhydrAMINE 25 MG tablet  Commonly known as:  BENADRYL  Take 25 mg by mouth every 6 (six) hours as needed for allergies or sleep.     docusate sodium 100 MG capsule  Commonly known as:  COLACE  Take 1 capsule (100 mg total) by mouth 2 (two) times daily.     ferrous sulfate 325 (65 FE) MG tablet  Take 1 tablet (325 mg total) by mouth 3 (three) times daily with meals.     FOSRENOL 1000 MG chewable tablet  Generic drug:  lanthanum  Chew 1,000 mg by mouth 2 (two) times daily with a meal.     gabapentin 100 MG capsule  Commonly known as:  NEURONTIN  Take 200 mg by mouth at bedtime.     ondansetron 4 MG disintegrating tablet  Commonly known as:  ZOFRAN ODT  4mg  ODT q4 hours prn nausea/vomit     ondansetron 4 MG tablet  Commonly known as:  ZOFRAN  Take 1 tablet (4 mg total) by mouth every 8 (eight) hours as needed for nausea or vomiting.     oxyCODONE-acetaminophen 5-325 MG per tablet  Commonly known as:  PERCOCET/ROXICET  Take 1-2 tablets by mouth every 4 (four) hours as needed for severe pain.     pantoprazole 40 MG tablet  Commonly known as:  PROTONIX  Take 1 tablet (40 mg total) by mouth daily.     promethazine 25 MG tablet  Commonly known as:  PHENERGAN  Take 1 tablet (25 mg total) by mouth every 6 (six) hours as needed for nausea or vomiting.     zolpidem 5 MG tablet  Commonly known as:  AMBIEN  Take 1-2 tablets (5-10 mg total) by mouth at bedtime as needed for sleep.       Allergies  Allergen Reactions  . Morphine Shortness Of Breath  . Morphine And Related Anaphylaxis    Patient says she stops breathing Can take percocet  . Prednisone Other (See Comments)    Other reaction(s): Other (See Comments) Muscle spasms Patient says prednisone causes her to cramp all over, muscle spasms uncontrolled   Follow-up Information   Follow up with MATTINGLY,MICHAEL T, MD. (follow [place] as scheduled)    Specialty:  Nephrology   Contact information:    [place] [address] [phone]       Follow up with Follow up with [facility] as scheduled.       The results of significant diagnostics from this hospitalization (including imaging, microbiology, ancillary and laboratory) are listed below for reference.    Significant Diagnostic Studies: Ct Abdomen Pelvis  Wo Contrast  09/04/2013   CLINICAL DATA:  Sharp right abdominal pain. Patient had D and C 08/31/2013 after missed abortion 08/29/2013. White cell count 11. Microhematuria.  EXAM: CT ABDOMEN AND PELVIS WITHOUT CONTRAST  TECHNIQUE: Multidetector CT imaging of the abdomen and pelvis was performed following the standard protocol without IV contrast.  COMPARISON:  08/29/2013  FINDINGS: The lung bases are clear.  Large cyst in the lower pole left kidney measuring 5.8 cm diameter as seen on prior study. Bilateral renal parenchymal atrophy. No renal, ureteral, or bladder stone is identified. No bladder wall thickening. No hydronephrosis or hydroureter.  The unenhanced appearance of the liver, spleen, gallbladder, pancreas, adrenal glands, abdominal aorta, inferior vena cava, and retroperitoneal lymph nodes is unremarkable. The stomach, small bowel, and colon are not abnormally distended. No free air or free fluid in the abdomen.  Pelvis: Prominent enlargement of the uterus consistent with recent postpartum state. Uterus appears to be decreasing in size since prior study. Cystic changes in the ovaries bilaterally are better demonstrated on prior CT and ultrasound. No definite evidence of any free or loculated pelvic fluid collection otherwise. There is no evidence of diverticulitis. Increased density in the appendix may represent residual contrast material. There is no appendiceal dilatation or inflammatory change. No destructive bone lesions.  IMPRESSION: No acute changes identified since prior study, allowing for differences in technique. Again demonstrated is bilateral renal  parenchymal atrophy with large cyst in the left kidney. No evidence of any renal or ureteral stone or obstruction. Enlarged uterus consistent with postpartum state. Bilateral ovarian cysts are better demonstrated on previous imaging.   Electronically Signed   By: Lucienne Capers M.D.   On: 09/04/2013 03:34   Dg Shoulder Right  09/04/2013   CLINICAL DATA:  Right shoulder pain upon awakening.  EXAM: RIGHT SHOULDER - 2+ VIEW  COMPARISON:  None.  FINDINGS: No acute bony findings. Old healed right rib fractures. No dislocation. A cause for the patient's Shoulder pain is not readily apparent.  IMPRESSION: Negative.   Electronically Signed   By: Sherryl Barters M.D.   On: 09/04/2013 00:59   US Ob Transvaginal  09/04/2013   CLINICAL DATA:  Abdominal pain after D and C 08/31/13. Positive urine pregnancy test. Quantitative beta HCG is not available.  EXAM: TRANSVAGINAL OB ULTRASOUND  TECHNIQUE: Transvaginal ultrasound was performed for complete evaluation of the gestation as well as the maternal uterus, adnexal regions, and pelvic cul-de-sac.  COMPARISON:  CT abdomen and pelvis 08/29/2013. Ultrasound 08/22/2013.  FINDINGS: Intrauterine gestational sac: No intrauterine pregnancy is demonstrated.  Yolk sac:  Not demonstrated.  Embryo:  Not demonstrated.  Cardiac Activity: Not demonstrated.  Maternal uterus/adnexae: The uterus is slightly retroverted, measuring 12.8 x 7 x 7.3 cm. Normal homogeneous appearance of the endometrial stripe with thickness measured at 9.2 mm.  Right ovary measures 6.9 x 5.1 x 8 cm and contains multiple cysts including a dominant cyst measuring 4.9 x 3.6 x 3 cm. This dominant cyst is multiple internal echoes and likely represents a hemorrhagic cyst. The dominant cysts larger than on the prior study and hemorrhage is new.  Left ovary measures 5.3 x 4.8 x 4.4 cm. Normal follicular changes are demonstrated.  Small amount of free fluid in the pelvis.  IMPRESSION: No residual intrauterine pregnancy is  demonstrated. Endometrial stripe thickness is normal. Dominant hemorrhagic appearing cyst in the right ovary. Small amount of free fluid in the pelvis.   Electronically Signed   By: Oren Beckmann.D.  On: 09/04/2013 00:56   US Ob Transvaginal  08/22/2013   CLINICAL DATA:  Vaginal discharge; end-stage renal disease ; assessment for viability  EXAM: TRANSVAGINAL OB ULTRASOUND  TECHNIQUE: Transvaginal ultrasound was performed for complete evaluation of the gestation as well as the maternal uterus, adnexal regions, and pelvic cul-de-sac.  COMPARISON:  Aug 08, 2013  FINDINGS: Intrauterine gestational sac: Visualized/normal in shape.  Yolk sac:  Not visualized  Embryo:  Visualized  Cardiac Activity: Not visualized  Heart Rate: 0 bpm  CRL:   11  mm   7 w 2 d  Maternal uterus/adnexae: Maternal uterus otherwise appears normal with the exception of a small subchorionic hemorrhage. There is a corpus luteum in the left ovary measuring 4.0 x 2.9 x 2.5 cm, containing a small amount of presumed hemorrhage. Maternal ovaries otherwise appear normal. There is no free maternal fluid.  IMPRESSION: There is a single fetus measuring 11 mm in length without demonstrable fetal cardiac activity. Findings meet definitive criteria for failed pregnancy. This follows SRU consensus guidelines: Diagnostic Criteria for Nonviable Pregnancy Early in the First Trimester. Alison Stalling J Med [phone].  Small subchorionic hemorrhage.  Hemorrhagic corpus luteum left ovary.   Electronically Signed   By: Lowella Grip M.D.   On: 08/22/2013 10:36   US Transvaginal Non-ob  09/09/2013   CLINICAL DATA:  Pelvic pain, recent D and C 08/31/2013 missed abortion  EXAM: TRANSABDOMINAL AND TRANSVAGINAL ULTRASOUND OF PELVIS  TECHNIQUE: Both transabdominal and transvaginal ultrasound examinations of the pelvis were performed. Transabdominal technique was performed for global imaging of the pelvis including uterus, ovaries, adnexal regions, and pelvic  cul-de-sac. It was necessary to proceed with endovaginal exam following the transabdominal exam to visualize the ovaries.  COMPARISON:  None  FINDINGS: Uterus  Measurements: Measures 14 x 6.2 x 9.4 cm. No fibroids or other mass visualized.  Endometrium  Thickness: Measures 13 mm thickness. Trace fluid noted endometrial canal. No retained products of conception  Right ovary  Measurements: 10.3 x 7.4 x 8.4 cm. Multiple complex cysts are noted the largest measures 5 x 4 cm  Left ovary  Measurements: Measures 1.2 x 6.5 x 8.6 cm. Multiple complex cysts are noted. The largest cyst with a septation measures 5.3 by 3.4 cm. Subtle flow is noted within septation. Normal appearance/no adnexal mass.  Other findings  Trace pelvic free fluid is noted.  IMPRESSION: 1. Mild enlarged uterus measures 14 x 9.4 cm without fibroids or focal abnormality. Trace fluid within endometrial cavity without retained products of conception. 2. Bilateral enlarged ovary with multiple complex cyst. The largest cyst in right ovary measures 5 x 4 cm. A cyst with a septation in left ovary measures 5.3 x 3.4 cm. Follow-up examination in 6 to 12 weeks is recommended to assure resolution or stability.   Electronically Signed   By: Lahoma Crocker M.D.   On: 09/09/2013 11:26   US Pelvis Complete  09/09/2013   CLINICAL DATA:  Pelvic pain, recent D and C 08/31/2013 missed abortion  EXAM: TRANSABDOMINAL AND TRANSVAGINAL ULTRASOUND OF PELVIS  TECHNIQUE: Both transabdominal and transvaginal ultrasound examinations of the pelvis were performed. Transabdominal technique was performed for global imaging of the pelvis including uterus, ovaries, adnexal regions, and pelvic cul-de-sac. It was necessary to proceed with endovaginal exam following the transabdominal exam to visualize the ovaries.  COMPARISON:  None  FINDINGS: Uterus  Measurements: Measures 14 x 6.2 x 9.4 cm. No fibroids or other mass visualized.  Endometrium  Thickness: Measures 13 mm  thickness. Trace  fluid noted endometrial canal. No retained products of conception  Right ovary  Measurements: 10.3 x 7.4 x 8.4 cm. Multiple complex cysts are noted the largest measures 5 x 4 cm  Left ovary  Measurements: Measures 1.2 x 6.5 x 8.6 cm. Multiple complex cysts are noted. The largest cyst with a septation measures 5.3 by 3.4 cm. Subtle flow is noted within septation. Normal appearance/no adnexal mass.  Other findings  Trace pelvic free fluid is noted.  IMPRESSION: 1. Mild enlarged uterus measures 14 x 9.4 cm without fibroids or focal abnormality. Trace fluid within endometrial cavity without retained products of conception. 2. Bilateral enlarged ovary with multiple complex cyst. The largest cyst in right ovary measures 5 x 4 cm. A cyst with a septation in left ovary measures 5.3 x 3.4 cm. Follow-up examination in 6 to 12 weeks is recommended to assure resolution or stability.   Electronically Signed   By: Lahoma Crocker M.D.   On: 09/09/2013 11:26   Ct Abdomen Pelvis W Contrast  08/29/2013   CLINICAL DATA:  Abdominal pain.  EXAM: CT ABDOMEN AND PELVIS WITH CONTRAST  TECHNIQUE: Multidetector CT imaging of the abdomen and pelvis was performed using the standard protocol following bolus administration of intravenous contrast.  CONTRAST:  67mL OMNIPAQUE IOHEXOL 300 MG/ML  SOLN  COMPARISON:  None.  FINDINGS: The lung bases demonstrate bibasilar atelectasis. No pericardial effusion.  The solid abdominal organs are unremarkable. There is a large left renal cyst and both kidneys are small.  The stomach, duodenum, small bowel and colon are grossly normal. The appendix is normal. No mesenteric or retroperitoneal mass or adenopathy. The aorta and branch vessels are normal. The major venous structures are patent.  Uterus is enlarged and the endometrium has thickened. Patient has a recent pregnancy with fetal demise. Probable ongoing abortion. There are bilateral ovarian cysts. No free pelvic fluid collections.  The bony structures  are unremarkable.  IMPRESSION: 1. Enlarged uterus and prominent endometrium due to recent pregnancy. Probable ongoing abortion. 2. Bilateral ovarian cyst. 3. No acute abdominal findings, mass lesions or adenopathy. 4. Small kidneys with history of end-stage renal disease.   Electronically Signed   By: Kalman Jewels M.D.   On: 08/29/2013 18:21   Dg Chest Portable 1 View  09/08/2013   CLINICAL DATA:  Chest pain.  EXAM: PORTABLE CHEST - 1 VIEW  COMPARISON:  September 10, 2012.  FINDINGS: The heart size and mediastinal contours are within normal limits. Both lungs are clear. No pneumothorax or pleural effusion is noted. The visualized skeletal structures are unremarkable.  IMPRESSION: No acute cardiopulmonary abnormality seen.   Electronically Signed   By: Sabino Dick M.D.   On: 09/08/2013 19:44    Microbiology: Recent Results (from the past 240 hour(s))  GC/CHLAMYDIA PROBE AMP     Status: None   Collection Time    09/08/13  6:35 PM      Result Value Ref Range Status   CT Probe RNA NEGATIVE  NEGATIVE Final   GC Probe RNA NEGATIVE  NEGATIVE Final   Comment: (NOTE)                                                                                               **  Normal Reference Range: Negative**          Assay performed using the Gen-Probe APTIMA COMBO2 (R) Assay.     Acceptable specimen types for this assay include APTIMA Swabs (Unisex,     endocervical, urethral, or vaginal), first void urine, and ThinPrep     liquid based cytology samples.     Performed at [place]     Status: Abnormal   Collection Time    09/08/13  6:35 PM      Result Value Ref Range Status   Yeast Wet Prep HPF POC NONE SEEN  NONE SEEN Final   Trich, Wet Prep NONE SEEN  NONE SEEN Final   Clue Cells Wet Prep HPF POC NONE SEEN  NONE SEEN Final   WBC, Wet Prep HPF POC RARE (*) NONE SEEN Final     Labs: Basic Metabolic Panel:  Recent Labs Lab 09/03/13 2217 09/08/13 1840 09/09/13 0056  NA  137 141 138  K 4.2 3.7 3.7  CL 96 97 94*  CO2 23 32 30  GLUCOSE 96 87 106*  BUN 55* 21 25*  CREATININE 12.20* 6.26* 7.30*  CALCIUM 9.2 8.8 8.8   Liver Function Tests:  Recent Labs Lab 09/09/13 0056  AST 7  ALT 6  ALKPHOS 33*  BILITOT <0.2*  PROT 7.0  ALBUMIN 3.2*    Recent Labs Lab 09/03/13 2217  LIPASE 59   No results found for this basename: AMMONIA,  in the last 168 hours CBC:  Recent Labs Lab 09/03/13 2217 09/08/13 1840 09/09/13 0056  WBC 11.0* 10.0 10.9*  NEUTROABS 6.9  --   --   HGB 7.1* 7.2* 7.2*  HCT 21.4* 21.6* 21.7*  MCV 95.1 93.1 94.8  PLT 226 303 300   Cardiac Enzymes:  Recent Labs Lab 09/08/13 1840 09/09/13 0056 09/09/13 0648 09/09/13 1254  TROPONINI <0.30 <0.30 <0.30 <0.30   BNP: BNP (last 3 results) No results found for this basename: PROBNP,  in the last 8760 hours CBG: No results found for this basename: GLUCAP,  in the last 168 hours     Signed:  CHIU, STEPHEN K  [facility] 09/09/2013, 4:01 PM

## 2013-09-09 NOTE — Progress Notes (Signed)
Patient discharged home with family, discharge instructions given and explained to patient and she verbalized understanding, patient denies any chest distress/pain. Skin intact, no wound noted, accompanied home by boyfriend/daugter, transported to the car by staff via wheelchair.

## 2013-09-09 NOTE — Progress Notes (Signed)
This patient is receiving Protonix. Based on criteria approved by the Pharmacy and Therapeutics Committee, this medication is being converted to the equivalent oral dose form. These criteria include:   . The patient is eating (either orally or per tube) and/or has been taking other orally administered medications for at least 24 hours.  . This patient has no evidence of active gastrointestinal bleeding or impaired GI absorption (gastrectomy, short bowel, patient on TNA or NPO).   If you have questions about this conversion, please contact the pharmacy department.  Kara Mead, [facility] 09/09/2013 9:11 AM

## 2013-09-09 NOTE — Progress Notes (Signed)
UR Completed.  [name], Desiree Jane [phone] 09/09/2013  

## 2013-09-12 LAB — TYPE AND SCREEN
ABO/RH(D): B NEG
Antibody Screen: POSITIVE
DAT, IgG: NEGATIVE

## 2013-09-14 ENCOUNTER — Emergency Department (EMERGENCY_DEPARTMENT_HOSPITAL)
Admission: EM | Admit: 2013-09-14 | Discharge: 2013-09-14 | Disposition: A | Discharge status: Home / Self Care | Payer: Medicare Other | Source: Home / Self Care | Attending: Emergency Medicine | Admitting: Emergency Medicine

## 2013-09-14 ENCOUNTER — Encounter (HOSPITAL_COMMUNITY): Payer: Self-pay | Admitting: Emergency Medicine

## 2013-09-14 DIAGNOSIS — F1210 Cannabis abuse, uncomplicated: Secondary | ICD-10-CM | POA: Diagnosis present

## 2013-09-14 DIAGNOSIS — K219 Gastro-esophageal reflux disease without esophagitis: Secondary | ICD-10-CM

## 2013-09-14 DIAGNOSIS — N039 Chronic nephritic syndrome with unspecified morphologic changes: Secondary | ICD-10-CM | POA: Diagnosis not present

## 2013-09-14 DIAGNOSIS — N186 End stage renal disease: Secondary | ICD-10-CM | POA: Insufficient documentation

## 2013-09-14 DIAGNOSIS — R42 Dizziness and giddiness: Secondary | ICD-10-CM | POA: Insufficient documentation

## 2013-09-14 DIAGNOSIS — Z992 Dependence on renal dialysis: Secondary | ICD-10-CM | POA: Insufficient documentation

## 2013-09-14 DIAGNOSIS — D509 Iron deficiency anemia, unspecified: Secondary | ICD-10-CM | POA: Diagnosis present

## 2013-09-14 DIAGNOSIS — J45909 Unspecified asthma, uncomplicated: Secondary | ICD-10-CM | POA: Insufficient documentation

## 2013-09-14 DIAGNOSIS — D631 Anemia in chronic kidney disease: Secondary | ICD-10-CM | POA: Diagnosis not present

## 2013-09-14 DIAGNOSIS — R5381 Other malaise: Secondary | ICD-10-CM | POA: Diagnosis not present

## 2013-09-14 DIAGNOSIS — N2581 Secondary hyperparathyroidism of renal origin: Secondary | ICD-10-CM | POA: Diagnosis present

## 2013-09-14 DIAGNOSIS — R0602 Shortness of breath: Secondary | ICD-10-CM

## 2013-09-14 DIAGNOSIS — I120 Hypertensive chronic kidney disease with stage 5 chronic kidney disease or end stage renal disease: Secondary | ICD-10-CM

## 2013-09-14 DIAGNOSIS — Z79899 Other long term (current) drug therapy: Secondary | ICD-10-CM

## 2013-09-14 DIAGNOSIS — R5383 Other fatigue: Secondary | ICD-10-CM | POA: Diagnosis not present

## 2013-09-14 DIAGNOSIS — Z8751 Personal history of pre-term labor: Secondary | ICD-10-CM

## 2013-09-14 DIAGNOSIS — N189 Chronic kidney disease, unspecified: Secondary | ICD-10-CM

## 2013-09-14 LAB — CBC WITH DIFFERENTIAL/PLATELET
BASOS PCT: 0 % (ref 0–1)
Basophils Absolute: 0 10*3/uL (ref 0.0–0.1)
EOS ABS: 0.4 10*3/uL (ref 0.0–0.7)
Eosinophils Relative: 3 % (ref 0–5)
HCT: 20.6 % — ABNORMAL LOW (ref 36.0–46.0)
Hemoglobin: 6.8 g/dL — CL (ref 12.0–15.0)
Lymphocytes Relative: 25 % (ref 12–46)
Lymphs Abs: 3.2 10*3/uL (ref 0.7–4.0)
MCH: 31.6 pg (ref 26.0–34.0)
MCHC: 33 g/dL (ref 30.0–36.0)
MCV: 95.8 fL (ref 78.0–100.0)
Monocytes Absolute: 0.9 10*3/uL (ref 0.1–1.0)
Monocytes Relative: 7 % (ref 3–12)
NEUTROS ABS: 8.5 10*3/uL — AB (ref 1.7–7.7)
Neutrophils Relative %: 66 % (ref 43–77)
PLATELETS: 285 10*3/uL (ref 150–400)
RBC: 2.15 MIL/uL — ABNORMAL LOW (ref 3.87–5.11)
RDW: 14.7 % (ref 11.5–15.5)
WBC: 13 10*3/uL — ABNORMAL HIGH (ref 4.0–10.5)

## 2013-09-14 LAB — COMPREHENSIVE METABOLIC PANEL
ALBUMIN: 3.1 g/dL — AB (ref 3.5–5.2)
ALK PHOS: 38 U/L — AB (ref 39–117)
ALT: 8 U/L (ref 0–35)
AST: 11 U/L (ref 0–37)
Anion gap: 20 — ABNORMAL HIGH (ref 5–15)
BUN: 43 mg/dL — ABNORMAL HIGH (ref 6–23)
CHLORIDE: 93 meq/L — AB (ref 96–112)
CO2: 25 mEq/L (ref 19–32)
Calcium: 8.9 mg/dL (ref 8.4–10.5)
Creatinine, Ser: 10.84 mg/dL — ABNORMAL HIGH (ref 0.50–1.10)
GFR calc Af Amer: 5 mL/min — ABNORMAL LOW (ref >90)
GFR calc non Af Amer: 4 mL/min — ABNORMAL LOW (ref >90)
Glucose, Bld: 112 mg/dL — ABNORMAL HIGH (ref 70–99)
POTASSIUM: 4 meq/L (ref 3.7–5.3)
Sodium: 138 mEq/L (ref 137–147)
TOTAL PROTEIN: 7.1 g/dL (ref 6.0–8.3)
Total Bilirubin: <0.2 mg/dL — ABNORMAL LOW (ref 0.3–1.2)

## 2013-09-14 LAB — TYPE AND SCREEN
ABO/RH(D): B NEG
Antibody Screen: POSITIVE
DAT, IgG: NEGATIVE

## 2013-09-14 NOTE — ED Notes (Signed)
MD made aware of the patient's hemoglobin.

## 2013-09-14 NOTE — ED Notes (Signed)
Phlebotomy at the bedside  

## 2013-09-14 NOTE — ED Notes (Signed)
Pt noted to not be in room at this time.  Per Dr Tamera Punt, pt received verbal discharge instructions, but left prior to receiving or being able to sign for written instructions.

## 2013-09-14 NOTE — Consult Note (Addendum)
Hospitalist Admission History and Physical  Patient name: Desiree [name] Medical record number: [id number] Date of birth: 15-Nov-1975 Age: 38 y.o. Gender: female  Primary Care Provider: Windy Kalata, MD  Chief Complaint: anemia  History of Present Illness:This is a 38 y.o. year old female with known history of end-stage renal disease on hemodialysis Monday Wednesday Friday, cocaine abuse, missed abortion status post D&E June 18, anemia of renal disease presenting with anemia. Patient noted to have been recently admitted June 26 of June 27 for chest pain, abdominal pain and anemia. Was discharged with hgb 7.2. Tranfusion not felt to be needed at the time. Comes in today with hgb 6.8. Had HD yesterday and was told to come to ER by HD staff. Pt denies any CP, SOB. Had some mild dizziness yesterday. States this is a chronic. No report of drug use.  No syncope. Denies vaginal bleeding. No fevers.   On presentation, hemodynamically stable. Hgb 6.8. WBC 13 ( 10.9 6/27), Cr 10.84, BUN 43. Per the pt, she thought she she would be able to get transfusion and go home. Pt does not desire admission at this time. She would like to have transfusion with her HD.   Assessment and Plan: Desiree [name] is a 38 y.o. year old female presenting with anemia   Anemia: I discussed pt's wishes with Dr. Serena Croissant. No active CP, SOB, weakness. We will try to coordinate transfusion with renal. Pt is currently refusing admission at this time. Will follow up pending ER discussion with renal.   Clinical update: per Dr. Serena Croissant, she discussed case with Dr. Lorrene Reid. Pt unable to get transfusion with HD. Doubts that short stay will be open tomorrow (Friday). Earliest transfusion could be coordinated would be Monday (7/6). Dr. Serena Croissant discussed this with pt. She is still refusing admission. States that she is going to drive her friend home and then come back if she has to.    Patient Active Problem List   Diagnosis Date Noted   . Chest pain 09/08/2013  . Hyperkalemia 08/29/2013  . Abdominal pain 08/29/2013  . Nausea & vomiting 08/29/2013  . Cocaine abuse 08/26/2013  . Renal disease in pregnancy, antepartum 08/22/2013  . Rh negative, antepartum 08/22/2013  . Missed abortion 08/22/2013  . End stage renal disease 02/21/2013  . Depression 04/21/2012  . Anemia 08/03/2011  . ESRD (end stage renal disease) 08/03/2011  . Hypokalemia 08/03/2011   Past Medical History: Past Medical History  Diagnosis Date  . Blood transfusion   . Preterm labor   . Dialysis patient   . Chronic kidney disease   . Hypertension   . Asthma     as a child  . Headache(784.0)   . GERD (gastroesophageal reflux disease)     nexium prn  . Renal insufficiency     Past Surgical History: Past Surgical History  Procedure Laterality Date  . Renal biopsy    . Dilation and curettage of uterus    . Dilation and evacuation  08/02/2011    Procedure: DILATATION AND EVACUATION;  Surgeon: Logan Bores, MD;  Location: [facility];  Service: Gynecology;;  . Av fistula placement Left 2010  . Revision of arteriovenous goretex graft Left 02/11/2013    Procedure: REVISION OF ARTERIOVENOUS GORTEX FISTULA;  Surgeon: Rosetta Posner, MD;  Location: [facility];  Service: Vascular;  Laterality: Left;  . Dilation and evacuation N/A 08/31/2013    Procedure: DILATATION AND EVACUATION;  Surgeon: Woodroe Mode, MD;  Location: [facility];  Service: Gynecology;  Laterality: N/A;    Social History: History   Social History  . Marital Status: Divorced    Spouse Name: N/A    Number of Children: N/A  . Years of Education: N/A   Social History Main Topics  . Smoking status: Passive Smoke Exposure - Never Smoker  . Smokeless tobacco: Never Used  . Alcohol Use: No  . Drug Use: Yes    Special: Marijuana     Comment: smokes marijuana SOMETIMES  TO KEEP APPEITIE FROM DIAYLSIS- last use 08/28/13  . Sexual Activity: Yes    Birth Control/ Protection: None     Comment:  trying to quit.   Other Topics Concern  . None   Social History Narrative  . None    Family History: Family History  Problem Relation Age of Onset  . Hypertension    . Diabetes type II    . Diabetes Mother   . Hyperlipidemia Mother   . Hypertension Mother   . Heart disease Mother     Allergies: Allergies  Allergen Reactions  . Morphine And Related Anaphylaxis    Patient says she stops breathing Can take percocet  . Prednisone Other (See Comments)    Other reaction(s): Other (See Comments) Muscle spasms Patient says prednisone causes her to cramp all over, muscle spasms uncontrolled  . Geralyn Flash [Fish Allergy] Itching, Swelling, Rash and Other (See Comments)    Face droops also    No current facility-administered medications for this encounter.   Current Outpatient Prescriptions  Medication Sig Dispense Refill  . diphenhydrAMINE (BENADRYL) 25 MG tablet Take 50 mg by mouth daily.       Marland Kitchen gabapentin (NEURONTIN) 100 MG capsule Take 200 mg by mouth at bedtime.      . pantoprazole (PROTONIX) 40 MG tablet Take 1 tablet (40 mg total) by mouth daily.  30 tablet  0  . zolpidem (AMBIEN) 5 MG tablet Take 1-2 tablets (5-10 mg total) by mouth at bedtime as needed for sleep.  30 tablet  2   Review Of Systems: 12 point ROS negative except as noted above in HPI.  Physical Exam: Filed Vitals:   09/14/13 2030  BP: 114/69  Pulse: 98  Temp:   Resp: 21    General: alert and cooperative HEENT: PERRLA and extra ocular movement intact Heart: S1, S2 normal, no murmur, rub or gallop, regular rate and rhythm Lungs: clear to auscultation, no wheezes or rales and unlabored breathing Abdomen: abdomen is soft without significant tenderness, masses, organomegaly or guarding Extremities: extremities normal, atraumatic, no cyanosis or edema Skin:no rashes, no ecchymoses Neurology: normal without focal findings  Labs and Imaging: Lab Results  Component Value Date/Time   NA 138 09/14/2013  7:03  PM   K 4.0 09/14/2013  7:03 PM   CL 93* 09/14/2013  7:03 PM   CO2 25 09/14/2013  7:03 PM   BUN 43* 09/14/2013  7:03 PM   CREATININE 10.84* 09/14/2013  7:03 PM   CREATININE 19.35* 08/22/2013  9:18 AM   GLUCOSE 112* 09/14/2013  7:03 PM   Lab Results  Component Value Date   WBC 13.0* 09/14/2013   HGB 6.8* 09/14/2013   HCT 20.6* 09/14/2013   MCV 95.8 09/14/2013   PLT 285 09/14/2013    No results found.         Shanda Howells MD  Pager: [phone]

## 2013-09-14 NOTE — ED Notes (Signed)
Pt. reports low HGB= 6.8 , seen here today , blood tests done today and left  , pt. stated " I feel all right". Ambulatory / respirations unlabored .

## 2013-09-14 NOTE — Discharge Instructions (Signed)
Iron Deficiency Anemia Anemia is a condition in which there are less red blood cells or hemoglobin in the blood than normal. Hemoglobin is the part of red blood cells that carries oxygen. Iron deficiency anemia is anemia caused by too little iron. It is the most common type of anemia. It may leave you tired and short of breath. CAUSES   Lack of iron in the diet.  Poor absorption of iron, as seen with intestinal disorders.  Intestinal bleeding.  Heavy periods. SIGNS AND SYMPTOMS  Mild anemia may not be noticeable. Symptoms may include:  Fatigue.  Headache.  Pale skin.  Weakness.  Tiredness.  Shortness of breath.  Dizziness.  Cold hands and feet.  Fast or irregular heartbeat. DIAGNOSIS  Diagnosis requires a thorough evaluation and physical exam by your health care provider. Blood tests are generally used to confirm iron deficiency anemia. Additional tests may be done to find the underlying cause of your anemia. These may include:  Testing for blood in the stool (fecal occult blood test).  A procedure to see inside the colon and rectum (colonoscopy).  A procedure to see inside the esophagus and stomach (endoscopy). TREATMENT  Iron deficiency anemia is treated by correcting the cause of the deficiency. Treatment may involve:  Adding iron-rich foods to your diet.  Taking iron supplements. Pregnant or breastfeeding women need to take extra iron because their normal diet usually does not provide the required amount.  Taking vitamins. Vitamin C improves the absorption of iron. Your health care provider may recommend that you take your iron tablets with a glass of orange juice or vitamin C supplement.  Medicines to make heavy menstrual flow lighter.  Surgery. HOME CARE INSTRUCTIONS   Take iron as directed by your health care provider.  If you cannot tolerate taking iron supplements by mouth, talk to your health care provider about taking them through a vein  (intravenously) or an injection into a muscle.  For the best iron absorption, iron supplements should be taken on an empty stomach. If you cannot tolerate them on an empty stomach, you may need to take them with food.  Do not drink milk or take antacids at the same time as your iron supplements. Milk and antacids may interfere with the absorption of iron.  Iron supplements can cause constipation. Make sure to include fiber in your diet to prevent constipation. A stool softener may also be recommended.  Take vitamins as directed by your health care provider.  Eat a diet rich in iron. Foods high in iron include liver, lean beef, whole-grain bread, eggs, dried fruit, and dark green leafy vegetables. SEEK IMMEDIATE MEDICAL CARE IF:   You faint. If this happens, do not drive. Call your local emergency services (911 in [place] if no other help is available.  You have chest pain.  You feel nauseous or vomit.  You have severe or increased shortness of breath with activity.  You feel weak.  You have a rapid heartbeat.  You have unexplained sweating.  You become light-headed when getting up from a chair or bed. MAKE SURE YOU:   Understand these instructions.  Will watch your condition.  Will get help right away if you are not doing well or get worse. Document Released: 02/28/2000 Document Revised: 03/07/2013 Document Reviewed: 11/07/2012 Exit[employer] Patient Information 2015 [employer]. This information is not intended to replace advice given to you by your health care provider. Make sure you discuss any questions you have with your health care provider.  

## 2013-09-14 NOTE — ED Provider Notes (Signed)
CSN: [id number]     Arrival date & time 09/14/13  1839 History   First MD Initiated Contact with Patient 09/14/13 1853     Chief Complaint  Patient presents with  . Abnormal Lab     (Consider location/radiation/quality/duration/timing/severity/associated sxs/prior Treatment) HPI Comments: Patient presents for an abnormal hemoglobin. She had a recent D&C for a miscarriage about 2 weeks ago at the [facility] hospital. She's had some anemia since that time. She states her vaginal bleeding has stopped. A review of her labs it looks like her baseline hemoglobin runs around 10-11. Sensory the knee, her hemoglobin has been around 7. She was recently admitted for her low hemoglobin that was symptomatic. He did not do blood transfusion as they did not feel it is indicated and she started iron supplements. She was sent over here again today from dialysis that she had yesterday for a low hemoglobin. Her hemoglobin is 6.8 today. She does complain of some mild lightheadedness. Occasionally she some shortness of breath but denies any now. She denies any chest pain. She denies any rectal bleeding.   Past Medical History  Diagnosis Date  . Blood transfusion   . Preterm labor   . Dialysis patient   . Chronic kidney disease   . Hypertension   . Asthma     as a child  . Headache(784.0)   . GERD (gastroesophageal reflux disease)     nexium prn  . Renal insufficiency    Past Surgical History  Procedure Laterality Date  . Renal biopsy    . Dilation and curettage of uterus    . Dilation and evacuation  08/02/2011    Procedure: DILATATION AND EVACUATION;  Surgeon: Logan Bores, MD;  Location: [facility];  Service: Gynecology;;  . Av fistula placement Left 2010  . Revision of arteriovenous goretex graft Left 02/11/2013    Procedure: REVISION OF ARTERIOVENOUS GORTEX FISTULA;  Surgeon: Rosetta Posner, MD;  Location: [facility];  Service: Vascular;  Laterality: Left;  . Dilation and evacuation N/A 08/31/2013   Procedure: DILATATION AND EVACUATION;  Surgeon: Woodroe Mode, MD;  Location: [facility];  Service: Gynecology;  Laterality: N/A;   Family History  Problem Relation Age of Onset  . Hypertension    . Diabetes type II    . Diabetes Mother   . Hyperlipidemia Mother   . Hypertension Mother   . Heart disease Mother    History  Substance Use Topics  . Smoking status: Passive Smoke Exposure - Never Smoker  . Smokeless tobacco: Never Used  . Alcohol Use: No   OB History   Grav Para Term Preterm Abortions TAB SAB Ect Mult Living   6 1 1  0 4 2 2  0 0 1     Review of Systems  Constitutional: Negative for fever, chills, diaphoresis and fatigue.  HENT: Negative for congestion, rhinorrhea and sneezing.   Eyes: Negative.   Respiratory: Positive for shortness of breath. Negative for cough and chest tightness.   Cardiovascular: Negative for chest pain and leg swelling.  Gastrointestinal: Negative for nausea, vomiting, abdominal pain, diarrhea and blood in stool.  Genitourinary: Negative for frequency, hematuria, flank pain and difficulty urinating.  Musculoskeletal: Negative for arthralgias and back pain.  Skin: Negative for rash.  Neurological: Positive for light-headedness. Negative for dizziness, speech difficulty, weakness, numbness and headaches.      Allergies  Morphine and related; Prednisone; and Tuna  Home Medications   Prior to Admission medications   Medication Sig Start  Date End Date Taking? Authorizing Provider  diphenhydrAMINE (BENADRYL) 25 MG tablet Take 50 mg by mouth daily.    Yes Historical Provider, MD  gabapentin (NEURONTIN) 100 MG capsule Take 200 mg by mouth at bedtime.   Yes Historical Provider, MD  pantoprazole (PROTONIX) 40 MG tablet Take 1 tablet (40 mg total) by mouth daily. 08/31/13  Yes Domenic Polite, MD  zolpidem (AMBIEN) 5 MG tablet Take 1-2 tablets (5-10 mg total) by mouth at bedtime as needed for sleep. 08/22/13  Yes Sallyanne Havers Anyanwu, MD   BP 114/69  Pulse  98  Temp(Src) 98.9 F (37.2 C) (Oral)  Resp 21  Wt 184 lb 8 oz (83.689 kg)  SpO2 99%  LMP 09/04/2013 Physical Exam  Constitutional: She is oriented to person, place, and time. She appears well-developed and well-nourished.  HENT:  Head: Normocephalic and atraumatic.  Mouth/Throat: Oropharynx is clear and moist.  Eyes: Pupils are equal, round, and reactive to light.  Neck: Normal range of motion. Neck supple.  Cardiovascular: Normal rate, regular rhythm and normal heart sounds.   Pulmonary/Chest: Effort normal and breath sounds normal. No respiratory distress. She has no wheezes. She has no rales. She exhibits no tenderness.  Abdominal: Soft. Bowel sounds are normal. There is no tenderness. There is no rebound and no guarding.  Musculoskeletal: Normal range of motion. She exhibits no edema.  Lymphadenopathy:    She has no cervical adenopathy.  Neurological: She is alert and oriented to person, place, and time.  Skin: Skin is warm and dry. No rash noted.  Psychiatric: She has a normal mood and affect.    ED Course  Procedures (including critical care time) Labs Review Labs Reviewed  CBC WITH DIFFERENTIAL - Abnormal; Notable for the following:    WBC 13.0 (*)    RBC 2.15 (*)    Hemoglobin 6.8 (*)    HCT 20.6 (*)    Neutro Abs 8.5 (*)    All other components within normal limits  COMPREHENSIVE METABOLIC PANEL - Abnormal; Notable for the following:    Chloride 93 (*)    Glucose, Bld 112 (*)    BUN 43 (*)    Creatinine, Ser 10.84 (*)    Albumin 3.1 (*)    Alkaline Phosphatase 38 (*)    Total Bilirubin <0.2 (*)    GFR calc non Af Amer 4 (*)    GFR calc Af Amer 5 (*)    Anion gap 20 (*)    All other components within normal limits  TYPE AND SCREEN    Imaging Review No results found.   EKG Interpretation None      MDM   Final diagnoses:  Iron deficiency anemia    I did consult the hospitalist for admission. He was requesting that we try to set this up as an  outpatient. I contacted the on-call nephrologist, Dr. Lorrene Reid, he states that they're unable to do blood transfusions during dialysis anymore. She's unable to set it up as an outpatient in a short stay clinic given that they're closed for the holiday weekend. I did advise the patient that we could just keep her overnight for observation and do blood transfusion that way. This would not be a for admission. She's refusing and wants to go home. She states that she she has to drive her family members home and will come back to the ED to get a blood transfusion. I advised her that this would delay things and that she would have to  start from scratch. She still wants to leave and come back at later date.    Malvin Johns, MD 09/14/13 2145

## 2013-09-14 NOTE — ED Notes (Signed)
Hospitalist at the bedside 

## 2013-09-14 NOTE — ED Notes (Addendum)
MD Belfi at the bedside  

## 2013-09-14 NOTE — ED Notes (Signed)
Pt reports she went for dialysis yesterday and had blood work done, was called today and told to come here due to hgb 6.5. No acute distress noted at triage.

## 2013-09-15 ENCOUNTER — Inpatient Hospital Stay (HOSPITAL_COMMUNITY)
Admission: EM | Admit: 2013-09-15 | Discharge: 2013-09-15 | DRG: 811 | Disposition: A | Discharge status: Home / Self Care | Payer: Medicare Other | Attending: Internal Medicine | Admitting: Internal Medicine

## 2013-09-15 DIAGNOSIS — D631 Anemia in chronic kidney disease: Secondary | ICD-10-CM | POA: Diagnosis present

## 2013-09-15 DIAGNOSIS — N185 Chronic kidney disease, stage 5: Secondary | ICD-10-CM

## 2013-09-15 DIAGNOSIS — N189 Chronic kidney disease, unspecified: Secondary | ICD-10-CM

## 2013-09-15 DIAGNOSIS — N2581 Secondary hyperparathyroidism of renal origin: Secondary | ICD-10-CM | POA: Diagnosis present

## 2013-09-15 DIAGNOSIS — Z992 Dependence on renal dialysis: Secondary | ICD-10-CM | POA: Diagnosis not present

## 2013-09-15 DIAGNOSIS — R5381 Other malaise: Secondary | ICD-10-CM | POA: Diagnosis present

## 2013-09-15 DIAGNOSIS — F1210 Cannabis abuse, uncomplicated: Secondary | ICD-10-CM | POA: Diagnosis present

## 2013-09-15 DIAGNOSIS — N186 End stage renal disease: Secondary | ICD-10-CM | POA: Diagnosis present

## 2013-09-15 DIAGNOSIS — D509 Iron deficiency anemia, unspecified: Secondary | ICD-10-CM | POA: Diagnosis present

## 2013-09-15 DIAGNOSIS — I120 Hypertensive chronic kidney disease with stage 5 chronic kidney disease or end stage renal disease: Secondary | ICD-10-CM | POA: Diagnosis present

## 2013-09-15 DIAGNOSIS — Z79899 Other long term (current) drug therapy: Secondary | ICD-10-CM | POA: Diagnosis not present

## 2013-09-15 DIAGNOSIS — K219 Gastro-esophageal reflux disease without esophagitis: Secondary | ICD-10-CM | POA: Diagnosis present

## 2013-09-15 DIAGNOSIS — D62 Acute posthemorrhagic anemia: Secondary | ICD-10-CM

## 2013-09-15 DIAGNOSIS — D649 Anemia, unspecified: Secondary | ICD-10-CM | POA: Diagnosis present

## 2013-09-15 DIAGNOSIS — N039 Chronic nephritic syndrome with unspecified morphologic changes: Principal | ICD-10-CM

## 2013-09-15 LAB — CBC
HCT: 19.7 % — ABNORMAL LOW (ref 36.0–46.0)
HEMOGLOBIN: 6.4 g/dL — AB (ref 12.0–15.0)
MCH: 31.5 pg (ref 26.0–34.0)
MCHC: 32.5 g/dL (ref 30.0–36.0)
MCV: 97 fL (ref 78.0–100.0)
Platelets: 266 10*3/uL (ref 150–400)
RBC: 2.03 MIL/uL — AB (ref 3.87–5.11)
RDW: 14.8 % (ref 11.5–15.5)
WBC: 12.4 10*3/uL — AB (ref 4.0–10.5)

## 2013-09-15 LAB — PROTIME-INR
INR: 1.05 (ref 0.00–1.49)
PROTHROMBIN TIME: 13.7 s (ref 11.6–15.2)

## 2013-09-15 LAB — PREPARE RBC (CROSSMATCH)

## 2013-09-15 LAB — APTT: aPTT: 28 seconds (ref 24–37)

## 2013-09-15 LAB — GLUCOSE, CAPILLARY: Glucose-Capillary: 116 mg/dL — ABNORMAL HIGH (ref 70–99)

## 2013-09-15 MED ORDER — DOXERCALCIFEROL 4 MCG/2ML IV SOLN
6.0000 ug | INTRAVENOUS | Status: DC
  Administered 2013-09-15: 6 ug via INTRAVENOUS

## 2013-09-15 MED ORDER — RENA-VITE PO TABS: 1.0000 | ORAL_TABLET | Freq: Every day | ORAL | Status: DC

## 2013-09-15 MED ORDER — NEPRO/CARBSTEADY PO LIQD: 237.0000 mL | ORAL | Status: DC | PRN

## 2013-09-15 MED ORDER — DARBEPOETIN ALFA-POLYSORBATE 100 MCG/0.5ML IJ SOLN
100.0000 ug | INTRAMUSCULAR | Status: DC
  Administered 2013-09-15: 100 ug via INTRAVENOUS

## 2013-09-15 MED ORDER — DIPHENHYDRAMINE HCL 25 MG PO TABS
50.0000 mg | ORAL_TABLET | Freq: Every day | ORAL | Status: DC
  Filled 2013-09-15: qty 2

## 2013-09-15 MED ORDER — SODIUM CHLORIDE 0.9 % IJ SOLN
3.0000 mL | Freq: Two times a day (BID) | INTRAMUSCULAR | Status: DC
  Administered 2013-09-15: 3 mL via INTRAVENOUS

## 2013-09-15 MED ORDER — SODIUM CHLORIDE 0.9 % IV SOLN: 100.0000 mL | INTRAVENOUS | Status: DC | PRN

## 2013-09-15 MED ORDER — HYDROMORPHONE HCL PF 1 MG/ML IJ SOLN
INTRAMUSCULAR | Status: AC
  Administered 2013-09-15: 1 mg via INTRAVENOUS
  Filled 2013-09-15: qty 1

## 2013-09-15 MED ORDER — LIDOCAINE HCL (PF) 1 % IJ SOLN: 5.0000 mL | INTRAMUSCULAR | Status: DC | PRN

## 2013-09-15 MED ORDER — CALCIUM ACETATE 667 MG PO CAPS
2668.0000 mg | ORAL_CAPSULE | Freq: Three times a day (TID) | ORAL | Status: DC
  Administered 2013-09-15: 2668 mg via ORAL
  Filled 2013-09-15 (×4): qty 4

## 2013-09-15 MED ORDER — DOXERCALCIFEROL 4 MCG/2ML IV SOLN
INTRAVENOUS | Status: AC
  Administered 2013-09-15: 6 ug via INTRAVENOUS
  Filled 2013-09-15: qty 4

## 2013-09-15 MED ORDER — PENTAFLUOROPROP-TETRAFLUOROETH EX AERO: 1.0000 "application " | INHALATION_SPRAY | CUTANEOUS | Status: DC | PRN

## 2013-09-15 MED ORDER — PANTOPRAZOLE SODIUM 40 MG PO TBEC
40.0000 mg | DELAYED_RELEASE_TABLET | Freq: Every day | ORAL | Status: DC
  Administered 2013-09-15: 40 mg via ORAL
  Filled 2013-09-15 (×2): qty 1

## 2013-09-15 MED ORDER — DIPHENHYDRAMINE HCL 50 MG PO CAPS
50.0000 mg | ORAL_CAPSULE | Freq: Every day | ORAL | Status: DC
  Administered 2013-09-15: 50 mg via ORAL
  Filled 2013-09-15: qty 2
  Filled 2013-09-15: qty 1

## 2013-09-15 MED ORDER — LIDOCAINE-PRILOCAINE 2.5-2.5 % EX CREA: 1.0000 "application " | TOPICAL_CREAM | CUTANEOUS | Status: DC | PRN

## 2013-09-15 MED ORDER — HYDROMORPHONE HCL PF 1 MG/ML IJ SOLN
1.0000 mg | INTRAMUSCULAR | Status: AC | PRN
  Administered 2013-09-15 (×2): 1 mg via INTRAVENOUS

## 2013-09-15 MED ORDER — OXYCODONE-ACETAMINOPHEN 5-325 MG PO TABS
1.0000 | ORAL_TABLET | Freq: Once | ORAL | Status: AC
  Administered 2013-09-15: 1 via ORAL
  Filled 2013-09-15: qty 1

## 2013-09-15 MED ORDER — ZOLPIDEM TARTRATE 5 MG PO TABS: 5.0000 mg | ORAL_TABLET | Freq: Every evening | ORAL | Status: DC | PRN

## 2013-09-15 MED ORDER — ONDANSETRON HCL 4 MG PO TABS
4.0000 mg | ORAL_TABLET | Freq: Four times a day (QID) | ORAL | Status: DC | PRN
  Administered 2013-09-15: 4 mg via ORAL
  Filled 2013-09-15: qty 1

## 2013-09-15 MED ORDER — HYDROMORPHONE HCL PF 1 MG/ML IJ SOLN
INTRAMUSCULAR | Status: AC
  Filled 2013-09-15: qty 1

## 2013-09-15 MED ORDER — ONDANSETRON HCL 4 MG/2ML IJ SOLN: 4.0000 mg | Freq: Four times a day (QID) | INTRAMUSCULAR | Status: DC | PRN

## 2013-09-15 MED ORDER — GABAPENTIN 100 MG PO CAPS
200.0000 mg | ORAL_CAPSULE | Freq: Every day | ORAL | Status: DC
  Filled 2013-09-15: qty 2

## 2013-09-15 MED ORDER — DARBEPOETIN ALFA-POLYSORBATE 100 MCG/0.5ML IJ SOLN
INTRAMUSCULAR | Status: AC
  Administered 2013-09-15: 100 ug via INTRAVENOUS
  Filled 2013-09-15: qty 0.5

## 2013-09-15 NOTE — Progress Notes (Signed)
Discharge instructions reviewed with patient. She verbalized understanding of medications, follow-up visit, and when to call the doctor if she should develop any further symptoms, and was impatient to leave. IV D/C'd from [facility], catheter intact. Telemetry removed. Patient showered without difficulty prior to discharge.  She was then escorted to hospital entrance accompanied by staff, and left in the company of her family. Desiree [name]

## 2013-09-15 NOTE — Discharge Summary (Signed)
Physician Discharge Summary  Desiree [name] [id number] DOB: 1975/10/07 DOA: 09/15/2013  PCP: Windy Kalata, MD  Admit date: 09/15/2013 Discharge date: 09/15/2013  Recommendations for Outpatient Follow-up:  1. F/u for routine HD as before 2. Nephrology: continue iron infusions and epo as needed  Discharge Diagnoses:  Principal Problem:   Symptomatic anemia Active Problems:   Acute blood loss anemia   ESRD (end stage renal disease)   Anemia of chronic kidney failure   Discharge Condition: stable, improved  Diet recommendation: renal  Wt Readings from Last 3 Encounters:  09/15/13 79.9 kg (176 lb 2.4 oz)  09/14/13 83.689 kg (184 lb 8 oz)  09/08/13 80.9 kg (178 lb 5.6 oz)    History of present illness:  Desiree [name] is a 38 y.o. female with Past medical history of ESRD on hemodialysis Monday Wednesday Friday, GERD, hypertension.  Patient presented with abnormal lab. She was in the dialysis at which time the ALT being her blood work. Her hemoglobin returned 6.8 and she was recommended to go to the ER for further evaluation.  Patient denies any active bleeding denies any nausea vomiting acid reflux abdominal distention diarrhea or constipation. She denies any trauma or injury.  She denies any recent change in her medication.  She mentions she is receiving heparin with her dialysis as she gets frequent locking.  She was earlier seen in the ER while at college but later on decided to left AMA and came back again for transfusion.  She started having generalized weakness and shortness of breath since last few days. She also has tachycardia at present but denies any chest pain or palpitation.  The patient is coming from home. And at her baseline independent for most of her ADL.   Hospital Course:   Symptomatic anemia, likely a combination of iron deficiency and anemia of renal disease.  She denies recent dark stools or blood in stools, but she had heavy bleeding during her  miscarriage and D&C a few weeks ago.  She has also missed some HD and therefore some epo injections.  Her INR was within normal limits.  Her iron level was checked at dialysis this week and was normal despite her heavy blood loss.   She was transfused 2 units of packed blood cells during hemodialysis.  She should followup for her regular hemodialysis on Monday.  Continue PPI.   End-stage renal disease, nephrology was consultative for hemodialysis. She continued her home medications.  Procedures:  Transfuse 2 units packed red blood cells  Consultations:  Nephrology  Discharge Exam: Filed Vitals:   09/15/13 1553  BP: 124/82  Pulse:   Temp:   Resp:    Filed Vitals:   09/15/13 1440 09/15/13 1455 09/15/13 1510 09/15/13 1553  BP: 96/53 121/61 102/57 124/82  Pulse: 79 76 88   Temp: 98.5 F (36.9 C) 98.5 F (36.9 C)    TempSrc: Oral Oral    Resp: 18 18 17    Height:      Weight:  79.9 kg (176 lb 2.4 oz)    SpO2: 98% 99%      General: Overweight to obese BF, NAD HEENT:  NCAT, MMM Cardiovascular: RRR, no mrg, 2+ pulses Respiratory: CTAB, no increased WOB ABD:  NABS, soft, ND/NT MSK:  No LEE, normal tone and bulk  Discharge Instructions      Discharge Instructions   Call MD for:  difficulty breathing, headache or visual disturbances    Complete by:  As directed  Call MD for:  extreme fatigue    Complete by:  As directed      Call MD for:  hives    Complete by:  As directed      Call MD for:  persistant dizziness or light-headedness    Complete by:  As directed      Call MD for:  persistant nausea and vomiting    Complete by:  As directed      Call MD for:  severe uncontrolled pain    Complete by:  As directed      Call MD for:  temperature >100.4    Complete by:  As directed      Diet - low sodium heart healthy    Complete by:  As directed      Discharge instructions    Complete by:  As directed   You were hospitalized with anemia.  You received two units of  blood during dialysis today.  Please return to your regular hemodialysis schedule.  They can continue to give you injections to prevent the need for transfusion in the future.     Increase activity slowly    Complete by:  As directed             Medication List         calcium acetate 667 MG capsule  Commonly known as:  PHOSLO  Take 2,668 mg by mouth 3 (three) times daily with meals.     diphenhydrAMINE 25 MG tablet  Commonly known as:  BENADRYL  Take 50 mg by mouth daily.     gabapentin 100 MG capsule  Commonly known as:  NEURONTIN  Take 200 mg by mouth at bedtime.     multivitamin Tabs tablet  Take 1 tablet by mouth at bedtime.     pantoprazole 40 MG tablet  Commonly known as:  PROTONIX  Take 1 tablet (40 mg total) by mouth daily.     zolpidem 5 MG tablet  Commonly known as:  AMBIEN  Take 1-2 tablets (5-10 mg total) by mouth at bedtime as needed for sleep.       Follow-up Information   Follow up with MATTINGLY,MICHAEL T, MD. Schedule an appointment as soon as possible for a visit in 1 week.   Specialty:  Nephrology   Contact information:   [place] [place] [employer] [place] [address] [phone]        The results of significant diagnostics from this hospitalization (including imaging, microbiology, ancillary and laboratory) are listed below for reference.    Significant Diagnostic Studies: Ct Abdomen Pelvis Wo Contrast  09/04/2013   CLINICAL DATA:  Sharp right abdominal pain. Patient had D and C 08/31/2013 after missed abortion 08/29/2013. White cell count 11. Microhematuria.  EXAM: CT ABDOMEN AND PELVIS WITHOUT CONTRAST  TECHNIQUE: Multidetector CT imaging of the abdomen and pelvis was performed following the standard protocol without IV contrast.  COMPARISON:  08/29/2013  FINDINGS: The lung bases are clear.  Large cyst in the lower pole left kidney measuring 5.8 cm diameter as seen on prior study. Bilateral renal parenchymal atrophy. No renal, ureteral, or bladder  stone is identified. No bladder wall thickening. No hydronephrosis or hydroureter.  The unenhanced appearance of the liver, spleen, gallbladder, pancreas, adrenal glands, abdominal aorta, inferior vena cava, and retroperitoneal lymph nodes is unremarkable. The stomach, small bowel, and colon are not abnormally distended. No free air or free fluid in the abdomen.  Pelvis: Prominent enlargement of the uterus consistent with recent  postpartum state. Uterus appears to be decreasing in size since prior study. Cystic changes in the ovaries bilaterally are better demonstrated on prior CT and ultrasound. No definite evidence of any free or loculated pelvic fluid collection otherwise. There is no evidence of diverticulitis. Increased density in the appendix may represent residual contrast material. There is no appendiceal dilatation or inflammatory change. No destructive bone lesions.  IMPRESSION: No acute changes identified since prior study, allowing for differences in technique. Again demonstrated is bilateral renal parenchymal atrophy with large cyst in the left kidney. No evidence of any renal or ureteral stone or obstruction. Enlarged uterus consistent with postpartum state. Bilateral ovarian cysts are better demonstrated on previous imaging.   Electronically Signed   By: Lucienne Capers M.D.   On: 09/04/2013 03:34   Dg Shoulder Right  09/04/2013   CLINICAL DATA:  Right shoulder pain upon awakening.  EXAM: RIGHT SHOULDER - 2+ VIEW  COMPARISON:  None.  FINDINGS: No acute bony findings. Old healed right rib fractures. No dislocation. A cause for the patient's Shoulder pain is not readily apparent.  IMPRESSION: Negative.   Electronically Signed   By: Sherryl Barters M.D.   On: 09/04/2013 00:59   US Ob Transvaginal  09/04/2013   CLINICAL DATA:  Abdominal pain after D and C 08/31/13. Positive urine pregnancy test. Quantitative beta HCG is not available.  EXAM: TRANSVAGINAL OB ULTRASOUND  TECHNIQUE: Transvaginal  ultrasound was performed for complete evaluation of the gestation as well as the maternal uterus, adnexal regions, and pelvic cul-de-sac.  COMPARISON:  CT abdomen and pelvis 08/29/2013. Ultrasound 08/22/2013.  FINDINGS: Intrauterine gestational sac: No intrauterine pregnancy is demonstrated.  Yolk sac:  Not demonstrated.  Embryo:  Not demonstrated.  Cardiac Activity: Not demonstrated.  Maternal uterus/adnexae: The uterus is slightly retroverted, measuring 12.8 x 7 x 7.3 cm. Normal homogeneous appearance of the endometrial stripe with thickness measured at 9.2 mm.  Right ovary measures 6.9 x 5.1 x 8 cm and contains multiple cysts including a dominant cyst measuring 4.9 x 3.6 x 3 cm. This dominant cyst is multiple internal echoes and likely represents a hemorrhagic cyst. The dominant cysts larger than on the prior study and hemorrhage is new.  Left ovary measures 5.3 x 4.8 x 4.4 cm. Normal follicular changes are demonstrated.  Small amount of free fluid in the pelvis.  IMPRESSION: No residual intrauterine pregnancy is demonstrated. Endometrial stripe thickness is normal. Dominant hemorrhagic appearing cyst in the right ovary. Small amount of free fluid in the pelvis.   Electronically Signed   By: Lucienne Capers M.D.   On: 09/04/2013 00:56   US Ob Transvaginal  08/22/2013   CLINICAL DATA:  Vaginal discharge; end-stage renal disease ; assessment for viability  EXAM: TRANSVAGINAL OB ULTRASOUND  TECHNIQUE: Transvaginal ultrasound was performed for complete evaluation of the gestation as well as the maternal uterus, adnexal regions, and pelvic cul-de-sac.  COMPARISON:  Aug 08, 2013  FINDINGS: Intrauterine gestational sac: Visualized/normal in shape.  Yolk sac:  Not visualized  Embryo:  Visualized  Cardiac Activity: Not visualized  Heart Rate: 0 bpm  CRL:   11  mm   7 w 2 d  Maternal uterus/adnexae: Maternal uterus otherwise appears normal with the exception of a small subchorionic hemorrhage. There is a corpus luteum  in the left ovary measuring 4.0 x 2.9 x 2.5 cm, containing a small amount of presumed hemorrhage. Maternal ovaries otherwise appear normal. There is no free maternal fluid.  IMPRESSION: There is a  single fetus measuring 11 mm in length without demonstrable fetal cardiac activity. Findings meet definitive criteria for failed pregnancy. This follows SRU consensus guidelines: Diagnostic Criteria for Nonviable Pregnancy Early in the First Trimester. Alison Stalling J Med [phone].  Small subchorionic hemorrhage.  Hemorrhagic corpus luteum left ovary.   Electronically Signed   By: Lowella Grip M.D.   On: 08/22/2013 10:36   US Transvaginal Non-ob  09/09/2013   CLINICAL DATA:  Pelvic pain, recent D and C 08/31/2013 missed abortion  EXAM: TRANSABDOMINAL AND TRANSVAGINAL ULTRASOUND OF PELVIS  TECHNIQUE: Both transabdominal and transvaginal ultrasound examinations of the pelvis were performed. Transabdominal technique was performed for global imaging of the pelvis including uterus, ovaries, adnexal regions, and pelvic cul-de-sac. It was necessary to proceed with endovaginal exam following the transabdominal exam to visualize the ovaries.  COMPARISON:  None  FINDINGS: Uterus  Measurements: Measures 14 x 6.2 x 9.4 cm. No fibroids or other mass visualized.  Endometrium  Thickness: Measures 13 mm thickness. Trace fluid noted endometrial canal. No retained products of conception  Right ovary  Measurements: 10.3 x 7.4 x 8.4 cm. Multiple complex cysts are noted the largest measures 5 x 4 cm  Left ovary  Measurements: Measures 1.2 x 6.5 x 8.6 cm. Multiple complex cysts are noted. The largest cyst with a septation measures 5.3 by 3.4 cm. Subtle flow is noted within septation. Normal appearance/no adnexal mass.  Other findings  Trace pelvic free fluid is noted.  IMPRESSION: 1. Mild enlarged uterus measures 14 x 9.4 cm without fibroids or focal abnormality. Trace fluid within endometrial cavity without retained products of  conception. 2. Bilateral enlarged ovary with multiple complex cyst. The largest cyst in right ovary measures 5 x 4 cm. A cyst with a septation in left ovary measures 5.3 x 3.4 cm. Follow-up examination in 6 to 12 weeks is recommended to assure resolution or stability.   Electronically Signed   By: Lahoma Crocker M.D.   On: 09/09/2013 11:26   US Pelvis Complete  09/09/2013   CLINICAL DATA:  Pelvic pain, recent D and C 08/31/2013 missed abortion  EXAM: TRANSABDOMINAL AND TRANSVAGINAL ULTRASOUND OF PELVIS  TECHNIQUE: Both transabdominal and transvaginal ultrasound examinations of the pelvis were performed. Transabdominal technique was performed for global imaging of the pelvis including uterus, ovaries, adnexal regions, and pelvic cul-de-sac. It was necessary to proceed with endovaginal exam following the transabdominal exam to visualize the ovaries.  COMPARISON:  None  FINDINGS: Uterus  Measurements: Measures 14 x 6.2 x 9.4 cm. No fibroids or other mass visualized.  Endometrium  Thickness: Measures 13 mm thickness. Trace fluid noted endometrial canal. No retained products of conception  Right ovary  Measurements: 10.3 x 7.4 x 8.4 cm. Multiple complex cysts are noted the largest measures 5 x 4 cm  Left ovary  Measurements: Measures 1.2 x 6.5 x 8.6 cm. Multiple complex cysts are noted. The largest cyst with a septation measures 5.3 by 3.4 cm. Subtle flow is noted within septation. Normal appearance/no adnexal mass.  Other findings  Trace pelvic free fluid is noted.  IMPRESSION: 1. Mild enlarged uterus measures 14 x 9.4 cm without fibroids or focal abnormality. Trace fluid within endometrial cavity without retained products of conception. 2. Bilateral enlarged ovary with multiple complex cyst. The largest cyst in right ovary measures 5 x 4 cm. A cyst with a septation in left ovary measures 5.3 x 3.4 cm. Follow-up examination in 6 to 12 weeks is recommended to assure resolution or stability.  Electronically Signed   By:  Lahoma Crocker M.D.   On: 09/09/2013 11:26   Ct Abdomen Pelvis W Contrast  08/29/2013   CLINICAL DATA:  Abdominal pain.  EXAM: CT ABDOMEN AND PELVIS WITH CONTRAST  TECHNIQUE: Multidetector CT imaging of the abdomen and pelvis was performed using the standard protocol following bolus administration of intravenous contrast.  CONTRAST:  38mL OMNIPAQUE IOHEXOL 300 MG/ML  SOLN  COMPARISON:  None.  FINDINGS: The lung bases demonstrate bibasilar atelectasis. No pericardial effusion.  The solid abdominal organs are unremarkable. There is a large left renal cyst and both kidneys are small.  The stomach, duodenum, small bowel and colon are grossly normal. The appendix is normal. No mesenteric or retroperitoneal mass or adenopathy. The aorta and branch vessels are normal. The major venous structures are patent.  Uterus is enlarged and the endometrium has thickened. Patient has a recent pregnancy with fetal demise. Probable ongoing abortion. There are bilateral ovarian cysts. No free pelvic fluid collections.  The bony structures are unremarkable.  IMPRESSION: 1. Enlarged uterus and prominent endometrium due to recent pregnancy. Probable ongoing abortion. 2. Bilateral ovarian cyst. 3. No acute abdominal findings, mass lesions or adenopathy. 4. Small kidneys with history of end-stage renal disease.   Electronically Signed   By: Kalman Jewels M.D.   On: 08/29/2013 18:21   Dg Chest Portable 1 View  09/08/2013   CLINICAL DATA:  Chest pain.  EXAM: PORTABLE CHEST - 1 VIEW  COMPARISON:  September 10, 2012.  FINDINGS: The heart size and mediastinal contours are within normal limits. Both lungs are clear. No pneumothorax or pleural effusion is noted. The visualized skeletal structures are unremarkable.  IMPRESSION: No acute cardiopulmonary abnormality seen.   Electronically Signed   By: Sabino Dick M.D.   On: 09/08/2013 19:44    Microbiology: Recent Results (from the past 240 hour(s))  GC/CHLAMYDIA PROBE AMP     Status: None    Collection Time    09/08/13  6:35 PM      Result Value Ref Range Status   CT Probe RNA NEGATIVE  NEGATIVE Final   GC Probe RNA NEGATIVE  NEGATIVE Final   Comment: (NOTE)                                                                                               **Normal Reference Range: Negative**          Assay performed using the Gen-Probe APTIMA COMBO2 (R) Assay.     Acceptable specimen types for this assay include APTIMA Swabs (Unisex,     endocervical, urethral, or vaginal), first void urine, and ThinPrep     liquid based cytology samples.     Performed at [place], [facility]     Status: Abnormal   Collection Time    09/08/13  6:35 PM      Result Value Ref Range Status   Yeast Wet Prep HPF POC NONE SEEN  NONE SEEN Final   Trich, Wet Prep NONE SEEN  NONE SEEN Final   Clue Cells Wet Prep HPF POC NONE SEEN  NONE SEEN Final   WBC, Wet Prep HPF POC RARE (*) NONE SEEN Final     Labs: Basic Metabolic Panel:  Recent Labs Lab 09/08/13 1840 09/09/13 0056 09/14/13 1903  NA 141 138 138  K 3.7 3.7 4.0  CL 97 94* 93*  CO2 32 30 25  GLUCOSE 87 106* 112*  BUN 21 25* 43*  CREATININE 6.26* 7.30* 10.84*  CALCIUM 8.8 8.8 8.9   Liver Function Tests:  Recent Labs Lab 09/09/13 0056 09/14/13 1903  AST 7 11  ALT 6 8  ALKPHOS 33* 38*  BILITOT <0.2* <0.2*  PROT 7.0 7.1  ALBUMIN 3.2* 3.1*   No results found for this basename: LIPASE, AMYLASE,  in the last 168 hours No results found for this basename: AMMONIA,  in the last 168 hours CBC:  Recent Labs Lab 09/08/13 1840 09/09/13 0056 09/14/13 1903 09/15/13 0157  WBC 10.0 10.9* 13.0* 12.4*  NEUTROABS  --   --  8.5*  --   HGB 7.2* 7.2* 6.8* 6.4*  HCT 21.6* 21.7* 20.6* 19.7*  MCV 93.1 94.8 95.8 97.0  PLT 303 300 285 266   Cardiac Enzymes:  Recent Labs Lab 09/08/13 1840 09/09/13 0056 09/09/13 0648 09/09/13 1254  TROPONINI <0.30 <0.30 <0.30 <0.30   BNP: BNP (last 3 results) No results found for  this basename: PROBNP,  in the last 8760 hours CBG:  Recent Labs Lab 09/15/13 0431  GLUCAP 116*    Time coordinating discharge: 35 minutes  Signed:  Ruffin Lada  [facility] 09/15/2013, 3:54 PM

## 2013-09-15 NOTE — ED Notes (Signed)
IV team re-paged.  °

## 2013-09-15 NOTE — ED Provider Notes (Signed)
CSN: [id number]     Arrival date & time 09/14/13  2330 History   First MD Initiated Contact with Patient 09/15/13 0012     Chief Complaint  Patient presents with  . Anemia     (Consider location/radiation/quality/duration/timing/severity/associated sxs/prior Treatment) HPI Patient is a 38 year old woman with a history of end-stage renal disease on dialysis. She comes to the emergency department because she was called by her dialysis center and told that she needed to be seen emergently for blood transfusion. She did hemoglobin of 6.8.   The patient endorses sometimes feeling lightheaded when she stands. She was last dialyzed yesterday. She has no chest pain breath.    Past Medical History  Diagnosis Date  . Blood transfusion   . Preterm labor   . Dialysis patient   . Chronic kidney disease   . Hypertension   . Asthma     as a child  . Headache(784.0)   . GERD (gastroesophageal reflux disease)     nexium prn  . Renal insufficiency    Past Surgical History  Procedure Laterality Date  . Renal biopsy    . Dilation and curettage of uterus    . Dilation and evacuation  08/02/2011    Procedure: DILATATION AND EVACUATION;  Surgeon: Logan Bores, MD;  Location: [facility];  Service: Gynecology;;  . Av fistula placement Left 2010  . Revision of arteriovenous goretex graft Left 02/11/2013    Procedure: REVISION OF ARTERIOVENOUS GORTEX FISTULA;  Surgeon: Rosetta Posner, MD;  Location: [facility];  Service: Vascular;  Laterality: Left;  . Dilation and evacuation N/A 08/31/2013    Procedure: DILATATION AND EVACUATION;  Surgeon: Woodroe Mode, MD;  Location: [facility];  Service: Gynecology;  Laterality: N/A;   Family History  Problem Relation Age of Onset  . Hypertension    . Diabetes type II    . Diabetes Mother   . Hyperlipidemia Mother   . Hypertension Mother   . Heart disease Mother    History  Substance Use Topics  . Smoking status: Passive Smoke Exposure - Never Smoker  .  Smokeless tobacco: Never Used  . Alcohol Use: No   OB History   Grav Para Term Preterm Abortions TAB SAB Ect Mult Living   6 1 1  0 4 2 2  0 0 1     Review of Systems Ten point review of symptoms performed and is negative with the exception of symptoms noted above.    Allergies  Morphine and related; Prednisone; and Tuna  Home Medications   Prior to Admission medications   Medication Sig Start Date End Date Taking? Authorizing Provider  calcium acetate (PHOSLO) 667 MG capsule Take 2,668 mg by mouth 3 (three) times daily with meals.   Yes Historical Provider, MD  diphenhydrAMINE (BENADRYL) 25 MG tablet Take 50 mg by mouth daily.    Yes Historical Provider, MD  gabapentin (NEURONTIN) 100 MG capsule Take 200 mg by mouth at bedtime.   Yes Historical Provider, MD  pantoprazole (PROTONIX) 40 MG tablet Take 1 tablet (40 mg total) by mouth daily. 08/31/13  Yes Domenic Polite, MD  zolpidem (AMBIEN) 5 MG tablet Take 1-2 tablets (5-10 mg total) by mouth at bedtime as needed for sleep. 08/22/13  Yes Sallyanne Havers Anyanwu, MD   BP 140/71  Pulse 110  Temp(Src) 99.5 F (37.5 C) (Oral)  Resp 20  Ht 5\' 5"  (1.651 m)  Wt 176 lb (79.833 kg)  BMI 29.29 kg/m2  SpO2 99%  LMP 09/04/2013 Physical Exam Gen: well developed and well nourished appearing Head: NCAT Eyes: PERL, EOMI Nose: no epistaixis or rhinorrhea Mouth/throat: mucosa is moist and pink Neck: supple, no stridor Lungs: CTA B, no wheezing, rhonchi or rales CV: RRR, no murmur, extremities appear well perfused.  Abd: soft, notender, nondistended Back: no ttp, no cva ttp Skin: warm and dry Ext: normal to inspection, no dependent edema Neuro: CN ii-xii grossly intact, no focal deficits Psyche; normal affect,  calm and cooperative.   ED Course  Procedures (including critical care time) Labs Review Results for orders placed during the hospital encounter of 09/14/13 (from the past 24 hour(s))  CBC WITH DIFFERENTIAL     Status: Abnormal    Collection Time    09/14/13  7:03 PM      Result Value Ref Range   WBC 13.0 (*) 4.0 - 10.5 K/uL   RBC 2.15 (*) 3.87 - 5.11 MIL/uL   Hemoglobin 6.8 (*) 12.0 - 15.0 g/dL   HCT 20.6 (*) 36.0 - 46.0 %   MCV 95.8  78.0 - 100.0 fL   MCH 31.6  26.0 - 34.0 pg   MCHC 33.0  30.0 - 36.0 g/dL   RDW 14.7  11.5 - 15.5 %   Platelets 285  150 - 400 K/uL   Neutrophils Relative % 66  43 - 77 %   Neutro Abs 8.5 (*) 1.7 - 7.7 K/uL   Lymphocytes Relative 25  12 - 46 %   Lymphs Abs 3.2  0.7 - 4.0 K/uL   Monocytes Relative 7  3 - 12 %   Monocytes Absolute 0.9  0.1 - 1.0 K/uL   Eosinophils Relative 3  0 - 5 %   Eosinophils Absolute 0.4  0.0 - 0.7 K/uL   Basophils Relative 0  0 - 1 %   Basophils Absolute 0.0  0.0 - 0.1 K/uL  COMPREHENSIVE METABOLIC PANEL     Status: Abnormal   Collection Time    09/14/13  7:03 PM      Result Value Ref Range   Sodium 138  137 - 147 mEq/L   Potassium 4.0  3.7 - 5.3 mEq/L   Chloride 93 (*) 96 - 112 mEq/L   CO2 25  19 - 32 mEq/L   Glucose, Bld 112 (*) 70 - 99 mg/dL   BUN 43 (*) 6 - 23 mg/dL   Creatinine, Ser 10.84 (*) 0.50 - 1.10 mg/dL   Calcium 8.9  8.4 - 10.5 mg/dL   Total Protein 7.1  6.0 - 8.3 g/dL   Albumin 3.1 (*) 3.5 - 5.2 g/dL   AST 11  0 - 37 U/L   ALT 8  0 - 35 U/L   Alkaline Phosphatase 38 (*) 39 - 117 U/L   Total Bilirubin <0.2 (*) 0.3 - 1.2 mg/dL   GFR calc non Af Amer 4 (*) >90 mL/min   GFR calc Af Amer 5 (*) >90 mL/min   Anion gap 20 (*) 5 - 15  TYPE AND SCREEN     Status: None   Collection Time    09/14/13  7:03 PM      Result Value Ref Range   ABO/RH(D) B NEG     Antibody Screen POS     Sample Expiration 09/17/2013     Antibody Identification PASSIVELY ACQUIRED ANTI-D     DAT, IgG NEG      MDM   Patient with anemia - likely secondary to chronic disease. Case discussed with  Dr. Posey Pronto who will admit the patient in observation status for blood transfusion.    Elyn Peers, MD 09/15/13 [phone]

## 2013-09-15 NOTE — ED Notes (Signed)
Paged Iv team

## 2013-09-15 NOTE — Consult Note (Addendum)
Renal Service Consult Note [facility]  Desiree [name] 09/15/2013 [place] Requesting Physician:  Dr Sheran Fava  Reason for Consult:  ESRD patient with anemia HPI: The patient is a 38 y.o. year-old with hx of HTN, ESRD, anemia who had a recent D&E for missed abortion on 6/18.  She was told to go the ED because labs drawn at HD earlier this week showed low Hgb.  Hgb was repeated in ED at 6.8.  She was transfused one unit prbc overnight and is to get a second unit w HD today.    Pt's only complaint is acute pain in both legs and restlessness that happens "every time I get blood". She was calm and fine prior to starting HD and getting transfused today per staff RN.    No CP, sob or abd pain, no n/v/d, no jt pain or skin rash. No HA, blurred vision or confusion  Past Medical History  Past Medical History  Diagnosis Date  . Blood transfusion   . Preterm labor   . Dialysis patient   . Chronic kidney disease   . Hypertension   . Asthma     as a child  . Headache(784.0)   . GERD (gastroesophageal reflux disease)     nexium prn  . Renal insufficiency    Past Surgical History  Past Surgical History  Procedure Laterality Date  . Renal biopsy    . Dilation and curettage of uterus    . Dilation and evacuation  08/02/2011    Procedure: DILATATION AND EVACUATION;  Surgeon: Logan Bores, MD;  Location: [facility];  Service: Gynecology;;  . Av fistula placement Left 2010  . Revision of arteriovenous goretex graft Left 02/11/2013    Procedure: REVISION OF ARTERIOVENOUS GORTEX FISTULA;  Surgeon: Rosetta Posner, MD;  Location: [facility];  Service: Vascular;  Laterality: Left;  . Dilation and evacuation N/A 08/31/2013    Procedure: DILATATION AND EVACUATION;  Surgeon: Woodroe Mode, MD;  Location: [facility];  Service: Gynecology;  Laterality: N/A;   Family History  Family History  Problem Relation Age of Onset  . Hypertension    . Diabetes type II    . Diabetes Mother   .  Hyperlipidemia Mother   . Hypertension Mother   . Heart disease Mother    Social History  reports that she has been passively smoking.  She has never used smokeless tobacco. She reports that she uses illicit drugs (Marijuana). She reports that she does not drink alcohol. Allergies  Allergies  Allergen Reactions  . Morphine And Related Anaphylaxis    Patient says she stops breathing Can take percocet  . Prednisone Other (See Comments)    Other reaction(s): Other (See Comments) Muscle spasms Patient says prednisone causes her to cramp all over, muscle spasms uncontrolled  . Geralyn Flash [Fish Allergy] Itching, Swelling, Rash and Other (See Comments)    Face droops also   Home medications Prior to Admission medications   Medication Sig Start Date End Date Taking? Authorizing Provider  calcium acetate (PHOSLO) 667 MG capsule Take 2,668 mg by mouth 3 (three) times daily with meals.   Yes Historical Provider, MD  diphenhydrAMINE (BENADRYL) 25 MG tablet Take 50 mg by mouth daily.    Yes Historical Provider, MD  gabapentin (NEURONTIN) 100 MG capsule Take 200 mg by mouth at bedtime.   Yes Historical Provider, MD  pantoprazole (PROTONIX) 40 MG tablet Take 1 tablet (40 mg total) by mouth daily. 08/31/13  Yes  Domenic Polite, MD  zolpidem (AMBIEN) 5 MG tablet Take 1-2 tablets (5-10 mg total) by mouth at bedtime as needed for sleep. 08/22/13  Yes Osborne Oman, MD  multivitamin (RENA-VIT) TABS tablet Take 1 tablet by mouth at bedtime. 09/15/13   Janece Canterbury, MD   Liver Function Tests  Recent Labs Lab 09/09/13 0056 09/14/13 1903  AST 7 11  ALT 6 8  ALKPHOS 33* 38*  BILITOT <0.2* <0.2*  PROT 7.0 7.1  ALBUMIN 3.2* 3.1*   No results found for this basename: LIPASE, AMYLASE,  in the last 168 hours CBC  Recent Labs Lab 09/09/13 0056 09/14/13 1903 09/15/13 0157  WBC 10.9* 13.0* 12.4*  NEUTROABS  --  8.5*  --   HGB 7.2* 6.8* 6.4*  HCT 21.7* 20.6* 19.7*  MCV 94.8 95.8 97.0  PLT 300 285 [id number]    Basic Metabolic Panel  Recent Labs Lab 09/08/13 1840 09/09/13 0056 09/14/13 1903  NA 141 138 138  K 3.7 3.7 4.0  CL 97 94* 93*  CO2 32 30 25  GLUCOSE 87 106* 112*  BUN 21 25* 43*  CREATININE 6.26* 7.30* 10.84*  CALCIUM 8.8 8.8 8.9    Filed Vitals:   09/15/13 1050 09/15/13 1104 09/15/13 1125 09/15/13 1140  BP: 137/80 119/65 133/74 154/88  Pulse: 89 88 84 89  Temp: 98.5 F (36.9 C)  98.6 F (37 C) 98.5 F (36.9 C)  TempSrc: Oral  Oral Oral  Resp: 19 18 18 17   Height:      Weight: 83.6 kg (184 lb 4.9 oz)     SpO2: 98% 96% 96% 100%   Exam: Alert, uncomfortable, writhing around d/t leg pain No rash, cyanosis or gangrene Sclera anicteric, throat clear No jvd RRR no MRG Chest is clear throughout, no rales or wheezing Abd soft, NTND, no ascites or HSM Ext no LE edema, ulcer, gangrene, pulses intact Neuro is nf, Ox 3 LFA AVF patent   HD: MWF [place] [facility] 3h 61min   2/2.25 [place]   79.5kg   LFA AVF   Heparin none Aranesp 200 q Wed, Hectorol 6ug TIW Lab: tsat 75%, ferr 354, pth 213, phos 6.5    Assessment: 1 Anemia of CKD- likely related to missing HD session and therefore she has been missing the scheduled aranesp injections which are once a week at OP dialysis. Getting prbc's.  No symptoms of bleeding.  2 ESRD on HD 3 HTN 4 Recent D&E for missed abortion 5 Volume- up 4 kg 6 HPTH cont binders , vit D   Plan- HD today, give 2nd unit prbc w HD, give 100 Aranesp today with HD, UF 4 kg as Ronnie Derby MD (pgr) [phone]    ([phone] 09/15/2013, 11:50 AM

## 2013-09-15 NOTE — H&P (Signed)
[facility] History and Physical  Patient: Desiree [name]  [id number]  DOB: Sep 21, 1975  DOS: the patient was seen and examined on 09/15/2013 PCP: Windy Kalata, MD  Chief Complaint: Abnormal lab  HPI: Desiree [name] is a 38 y.o. female with Past medical history of ESRD on hemodialysis Monday Wednesday Friday, GERD, hypertension. Patient presented with abnormal lab. She was in the dialysis at which time the ALT being her blood work. Her hemoglobin returned 6.8 and she was recommended to go to the ER for further evaluation. Patient denies any active bleeding denies any nausea vomiting acid reflux abdominal distention diarrhea or constipation. She denies any trauma or injury. She denies any recent change in her medication. She mentions she is receiving heparin with her dialysis as she gets frequent locking. She was earlier seen in the ER while at college but later on decided to left AMA and came back again for transfusion. She started having generalized weakness and shortness of breath since last few days. She also has tachycardia at present but denies any chest pain or palpitation.  The patient is coming from home. And at her baseline independent for most of her ADL.  Review of Systems: as mentioned in the history of present illness.  A Comprehensive review of the other systems is negative.  Past Medical History  Diagnosis Date  . Blood transfusion   . Preterm labor   . Dialysis patient   . Chronic kidney disease   . Hypertension   . Asthma     as a child  . Headache(784.0)   . GERD (gastroesophageal reflux disease)     nexium prn  . Renal insufficiency    Past Surgical History  Procedure Laterality Date  . Renal biopsy    . Dilation and curettage of uterus    . Dilation and evacuation  08/02/2011    Procedure: DILATATION AND EVACUATION;  Surgeon: Logan Bores, MD;  Location: [facility];  Service: Gynecology;;  . Av fistula placement Left 2010  . Revision of  arteriovenous goretex graft Left 02/11/2013    Procedure: REVISION OF ARTERIOVENOUS GORTEX FISTULA;  Surgeon: Rosetta Posner, MD;  Location: [facility];  Service: Vascular;  Laterality: Left;  . Dilation and evacuation N/A 08/31/2013    Procedure: DILATATION AND EVACUATION;  Surgeon: Woodroe Mode, MD;  Location: [facility];  Service: Gynecology;  Laterality: N/A;   Social History:  reports that she has been passively smoking.  She has never used smokeless tobacco. She reports that she uses illicit drugs (Marijuana). She reports that she does not drink alcohol.  Allergies  Allergen Reactions  . Morphine And Related Anaphylaxis    Patient says she stops breathing Can take percocet  . Prednisone Other (See Comments)    Other reaction(s): Other (See Comments) Muscle spasms Patient says prednisone causes her to cramp all over, muscle spasms uncontrolled  . Geralyn Flash [Fish Allergy] Itching, Swelling, Rash and Other (See Comments)    Face droops also    Family History  Problem Relation Age of Onset  . Hypertension    . Diabetes type II    . Diabetes Mother   . Hyperlipidemia Mother   . Hypertension Mother   . Heart disease Mother     Prior to Admission medications   Medication Sig Start Date End Date Taking? Authorizing Provider  calcium acetate (PHOSLO) 667 MG capsule Take 2,668 mg by mouth 3 (three) times daily with meals.   Yes Historical Provider, MD  diphenhydrAMINE (BENADRYL) 25 MG tablet Take 50 mg by mouth daily.    Yes Historical Provider, MD  gabapentin (NEURONTIN) 100 MG capsule Take 200 mg by mouth at bedtime.   Yes Historical Provider, MD  pantoprazole (PROTONIX) 40 MG tablet Take 1 tablet (40 mg total) by mouth daily. 08/31/13  Yes Domenic Polite, MD  zolpidem (AMBIEN) 5 MG tablet Take 1-2 tablets (5-10 mg total) by mouth at bedtime as needed for sleep. 08/22/13  Yes Osborne Oman, MD    Physical Exam: Filed Vitals:   09/15/13 0100 09/15/13 0115 09/15/13 0145 09/15/13 0230  BP:  117/62 111/58 113/64 98/69  Pulse: 99 102 102 94  Temp:      TempSrc:      Resp:   17   Height:      Weight:      SpO2: 100% 99% 100% 96%    General: Alert, Awake and Oriented to Time, Place and Person. Appear in mild distress Eyes: PERRL ENT: Oral Mucosa clear moist. Neck: No JVD Cardiovascular: S1 and S2 Present, no Murmur, Peripheral Pulses Present Respiratory: Bilateral Air entry equal and Decreased, Clear to Auscultation,  No Crackles, no wheezes Abdomen: Bowel Sound Present, Soft and Non tender Skin: No Rash Extremities: Bilateral Pedal edema, no calf tenderness Neurologic: Grossly no focal neuro deficit.  Labs on Admission:  CBC:  Recent Labs Lab 09/08/13 1840 09/09/13 0056 09/14/13 1903 09/15/13 0157  WBC 10.0 10.9* 13.0* 12.4*  NEUTROABS  --   --  8.5*  --   HGB 7.2* 7.2* 6.8* 6.4*  HCT 21.6* 21.7* 20.6* 19.7*  MCV 93.1 94.8 95.8 97.0  PLT 303 300 285 266    CMP     Component Value Date/Time   NA 138 09/14/2013 1903   K 4.0 09/14/2013 1903   CL 93* 09/14/2013 1903   CO2 25 09/14/2013 1903   GLUCOSE 112* 09/14/2013 1903   BUN 43* 09/14/2013 1903   CREATININE 10.84* 09/14/2013 1903   CREATININE 19.35* 08/22/2013 0918   CALCIUM 8.9 09/14/2013 1903   PROT 7.1 09/14/2013 1903   ALBUMIN 3.1* 09/14/2013 1903   AST 11 09/14/2013 1903   ALT 8 09/14/2013 1903   ALKPHOS 38* 09/14/2013 1903   BILITOT <0.2* 09/14/2013 1903   GFRNONAA 4* 09/14/2013 1903   GFRAA 5* 09/14/2013 1903    No results found for this basename: LIPASE, AMYLASE,  in the last 168 hours No results found for this basename: AMMONIA,  in the last 168 hours   Recent Labs Lab 09/08/13 1840 09/09/13 0056 09/09/13 0648 09/09/13 1254  TROPONINI <0.30 <0.30 <0.30 <0.30   BNP (last 3 results) No results found for this basename: PROBNP,  in the last 8760 hours  Radiological Exams on Admission: No results found.   Assessment/Plan Principal Problem:   Symptomatic anemia Active Problems:   ESRD (end stage renal  disease)   1. Symptomatic anemia Patient is presenting with tachycardia and hemoglobin of 6.4. At present she will be admitted to the hospital but I will give 2 units of PRBC. We'll follow serial H&H. INR RP is stable. She has history of anemia of chronic disease. We'll continue to monitor. Monitor on telemetry.  2. ESRD Nephrology has been consulted for continuation of hemodialysis.  Consults: Nephrology  DVT Prophylaxis: mechanical compression device Nutrition: Renal diet  Code Status: Full  Family Communication: Family was present at bedside, opportunity was given to ask question and all questions were answered satisfactorily at the time of  interview. Disposition: Admitted to inpatient in telemetry unit.  Author: Berle Mull, MD [facility] Pager: [phone] 09/15/2013, 3:14 AM    If 7PM-7AM, please contact night-coverage [email or web address] Password [email or web address]  **Disclaimer: This note may have been dictated with voice recognition software. Similar sounding words can inadvertently be transcribed and this note may contain transcription errors which may not have been corrected upon publication of note.**

## 2013-09-15 NOTE — ED Notes (Addendum)
Patient does not want IV if she is getting dialysis in the morning at 1130 and she can get it there. MD notified.

## 2013-09-17 LAB — TYPE AND SCREEN
ABO/RH(D): B NEG
ANTIBODY SCREEN: POSITIVE
DAT, IgG: NEGATIVE
UNIT DIVISION: 0
Unit division: 0
Unit division: 0

## 2013-10-02 ENCOUNTER — Ambulatory Visit: Payer: Self-pay | Admitting: Obstetrics & Gynecology

## 2013-10-02 ENCOUNTER — Telehealth: Payer: Self-pay | Admitting: General Practice

## 2013-10-02 ENCOUNTER — Encounter: Payer: Self-pay | Admitting: General Practice

## 2013-10-02 NOTE — Telephone Encounter (Signed)
Patient no showed for appt today. Called patient, no answer and unable to leave message due to voicemail box full. Will send letter

## 2013-10-27 ENCOUNTER — Emergency Department (HOSPITAL_COMMUNITY)
Admission: EM | Admit: 2013-10-27 | Discharge: 2013-10-27 | Disposition: A | Discharge status: Home / Self Care | Payer: Medicare Other | Attending: Emergency Medicine | Admitting: Emergency Medicine

## 2013-10-27 ENCOUNTER — Emergency Department (HOSPITAL_COMMUNITY): Payer: Medicare Other

## 2013-10-27 ENCOUNTER — Encounter (HOSPITAL_COMMUNITY): Payer: Self-pay | Admitting: Emergency Medicine

## 2013-10-27 DIAGNOSIS — M545 Low back pain, unspecified: Secondary | ICD-10-CM

## 2013-10-27 DIAGNOSIS — S199XXA Unspecified injury of neck, initial encounter: Secondary | ICD-10-CM

## 2013-10-27 DIAGNOSIS — W108XXA Fall (on) (from) other stairs and steps, initial encounter: Secondary | ICD-10-CM | POA: Insufficient documentation

## 2013-10-27 DIAGNOSIS — S161XXA Strain of muscle, fascia and tendon at neck level, initial encounter: Secondary | ICD-10-CM

## 2013-10-27 DIAGNOSIS — W010XXA Fall on same level from slipping, tripping and stumbling without subsequent striking against object, initial encounter: Secondary | ICD-10-CM | POA: Diagnosis not present

## 2013-10-27 DIAGNOSIS — S0990XA Unspecified injury of head, initial encounter: Secondary | ICD-10-CM | POA: Insufficient documentation

## 2013-10-27 DIAGNOSIS — S139XXA Sprain of joints and ligaments of unspecified parts of neck, initial encounter: Secondary | ICD-10-CM | POA: Insufficient documentation

## 2013-10-27 DIAGNOSIS — N186 End stage renal disease: Secondary | ICD-10-CM | POA: Insufficient documentation

## 2013-10-27 DIAGNOSIS — Y9389 Activity, other specified: Secondary | ICD-10-CM | POA: Diagnosis not present

## 2013-10-27 DIAGNOSIS — Y9289 Other specified places as the place of occurrence of the external cause: Secondary | ICD-10-CM | POA: Insufficient documentation

## 2013-10-27 DIAGNOSIS — K219 Gastro-esophageal reflux disease without esophagitis: Secondary | ICD-10-CM | POA: Diagnosis not present

## 2013-10-27 DIAGNOSIS — J45909 Unspecified asthma, uncomplicated: Secondary | ICD-10-CM | POA: Diagnosis not present

## 2013-10-27 DIAGNOSIS — S0993XA Unspecified injury of face, initial encounter: Secondary | ICD-10-CM | POA: Diagnosis present

## 2013-10-27 DIAGNOSIS — I120 Hypertensive chronic kidney disease with stage 5 chronic kidney disease or end stage renal disease: Secondary | ICD-10-CM | POA: Insufficient documentation

## 2013-10-27 DIAGNOSIS — Z79899 Other long term (current) drug therapy: Secondary | ICD-10-CM | POA: Diagnosis not present

## 2013-10-27 DIAGNOSIS — IMO0002 Reserved for concepts with insufficient information to code with codable children: Secondary | ICD-10-CM | POA: Insufficient documentation

## 2013-10-27 DIAGNOSIS — Z992 Dependence on renal dialysis: Secondary | ICD-10-CM | POA: Insufficient documentation

## 2013-10-27 DIAGNOSIS — Z7982 Long term (current) use of aspirin: Secondary | ICD-10-CM | POA: Insufficient documentation

## 2013-10-27 MED ORDER — OXYCODONE-ACETAMINOPHEN 5-325 MG PO TABS: 1.0000 | ORAL_TABLET | Freq: Four times a day (QID) | ORAL | Status: DC | PRN

## 2013-10-27 MED ORDER — DIAZEPAM 5 MG PO TABS
5.0000 mg | ORAL_TABLET | Freq: Once | ORAL | Status: AC
  Administered 2013-10-27: 5 mg via ORAL
  Filled 2013-10-27: qty 1

## 2013-10-27 MED ORDER — DIAZEPAM 5 MG PO TABS: 5.0000 mg | ORAL_TABLET | Freq: Two times a day (BID) | ORAL | Status: DC | PRN

## 2013-10-27 MED ORDER — OXYCODONE-ACETAMINOPHEN 5-325 MG PO TABS
1.0000 | ORAL_TABLET | Freq: Once | ORAL | Status: AC
  Administered 2013-10-27: 1 via ORAL
  Filled 2013-10-27: qty 1

## 2013-10-27 NOTE — ED Notes (Addendum)
Pt from home c/o fall down 14 wooden and concrete stairs after tripping over a cord 2 days ago. Pt denies LOC. Pt states that she has been taking OTC pain meds with no relief. Pt c/o neck pain with stiffness, unable to turn head to L. Pt also back pain all over and reports abrasions from fall. Pt also reports HA. Pt is A&O and in NAD

## 2013-10-27 NOTE — ED Provider Notes (Signed)
CSN: [id number]     Arrival date & time 10/27/13  1758 History   First MD Initiated Contact with Patient 10/27/13 1833     Chief Complaint  Patient presents with  . Fall  . Neck Pain  . Back Pain     (Consider location/radiation/quality/duration/timing/severity/associated sxs/prior Treatment) HPI Pt is a 38yo female with hx of ESRD on dialysis presenting to ED with c/o neck and back pain after tripping and falling down 14 wooden steps, landing on concrete 2 days ago. Denies hitting head or LOC.  States she thought pain would improve but it only worsened over last 2 days. States she has pain and stiffness in left side of her neck preventing her from moving her head to the left.   Pain in her neck and back is constant, aching, 9/10, as well as causing intermittent mild headache due to the tension in her neck. Pt states she has been using heating pads as well as taking tylenol and motrin w/o relief.  Pt states she has also been taking flexeril w/o relief.  States she does have chronic low back pain due to her dialysis but states she does not regularly take narcotic pain medication for the pain.  Denies hx of back surgeries. Denies numbness or tingling in arms or legs. Denies change in bowel or bladder habits. denies fever, n/v/d.   Past Medical History  Diagnosis Date  . Blood transfusion   . Preterm labor   . Dialysis patient   . Chronic kidney disease   . Hypertension   . Asthma     as a child  . Headache(784.0)   . GERD (gastroesophageal reflux disease)     nexium prn  . Renal insufficiency    Past Surgical History  Procedure Laterality Date  . Renal biopsy    . Dilation and curettage of uterus    . Dilation and evacuation  08/02/2011    Procedure: DILATATION AND EVACUATION;  Surgeon: Logan Bores, MD;  Location: [facility];  Service: Gynecology;;  . Av fistula placement Left 2010  . Revision of arteriovenous goretex graft Left 02/11/2013    Procedure: REVISION OF ARTERIOVENOUS  GORTEX FISTULA;  Surgeon: Rosetta Posner, MD;  Location: [facility];  Service: Vascular;  Laterality: Left;  . Dilation and evacuation N/A 08/31/2013    Procedure: DILATATION AND EVACUATION;  Surgeon: Woodroe Mode, MD;  Location: [facility];  Service: Gynecology;  Laterality: N/A;   Family History  Problem Relation Age of Onset  . Hypertension    . Diabetes type II    . Diabetes Mother   . Hyperlipidemia Mother   . Hypertension Mother   . Heart disease Mother    History  Substance Use Topics  . Smoking status: Passive Smoke Exposure - Never Smoker  . Smokeless tobacco: Never Used  . Alcohol Use: No   OB History   Grav Para Term Preterm Abortions TAB SAB Ect Mult Living   6 1 1  0 4 2 2  0 0 1     Review of Systems  Constitutional: Negative for fever and chills.  Respiratory: Negative for cough and shortness of breath.   Cardiovascular: Negative for chest pain and palpitations.  Gastrointestinal: Negative for nausea, vomiting, abdominal pain and diarrhea.  Musculoskeletal: Positive for back pain, neck pain and neck stiffness ( left sided). Negative for myalgias.  Skin: Negative for color change and wound.  Neurological: Positive for headaches. Negative for dizziness, weakness, light-headedness and numbness.  All  other systems reviewed and are negative.     Allergies  Morphine and related; Prednisone; and Tuna  Home Medications   Prior to Admission medications   Medication Sig Start Date End Date Taking? Authorizing Provider  aspirin 81 MG chewable tablet Chew 162 mg by mouth daily.   Yes Historical Provider, MD  calcium acetate (PHOSLO) 667 MG capsule Take 2,668 mg by mouth 3 (three) times daily with meals.   Yes Historical Provider, MD  diphenhydrAMINE (BENADRYL) 25 MG tablet Take 50 mg by mouth daily.    Yes Historical Provider, MD  gabapentin (NEURONTIN) 100 MG capsule Take 200 mg by mouth at bedtime.   Yes Historical Provider, MD  multivitamin (RENA-VIT) TABS tablet Take 1  tablet by mouth at bedtime. 09/15/13  Yes Janece Canterbury, MD  pantoprazole (PROTONIX) 40 MG tablet Take 1 tablet (40 mg total) by mouth daily. 08/31/13  Yes Domenic Polite, MD  sevelamer carbonate (RENVELA) 2.4 G PACK Take 2.4 g by mouth 3 (three) times daily with meals.   Yes Historical Provider, MD  zolpidem (AMBIEN) 5 MG tablet Take 1-2 tablets (5-10 mg total) by mouth at bedtime as needed for sleep. 08/22/13  Yes Osborne Oman, MD  diazepam (VALIUM) 5 MG tablet Take 1 tablet (5 mg total) by mouth every 12 (twelve) hours as needed for muscle spasms. 10/27/13   Noland Fordyce, PA-C  oxyCODONE-acetaminophen (PERCOCET/ROXICET) 5-325 MG per tablet Take 1 tablet by mouth every 6 (six) hours as needed for moderate pain or severe pain. 10/27/13   Noland Fordyce, PA-C   BP 179/91  Pulse 83  Temp(Src) 98.8 F (37.1 C) (Oral)  Resp 18  SpO2 99%  LMP 09/27/2013  Breastfeeding? No Physical Exam  Nursing note and vitals reviewed. Constitutional: She is oriented to person, place, and time. She appears well-developed and well-nourished. No distress.  morbidly obese female lying in exam bed, NAD  HENT:  Head: Normocephalic and atraumatic.  Eyes: Conjunctivae and EOM are normal. Pupils are equal, round, and reactive to light. No scleral icterus.  Neck: Neck supple.  Midline cervical spine tenderness over C5-7. Tenderness in left sided cervical musculature. Decreased head rotation to left due to pain.   Cardiovascular: Normal rate, regular rhythm and normal heart sounds.   Pulmonary/Chest: Effort normal and breath sounds normal. No respiratory distress. She has no wheezes. She has no rales. She exhibits no tenderness.  Abdominal: Soft. Bowel sounds are normal. She exhibits no distension and no mass. There is no tenderness. There is no rebound and no guarding.  Musculoskeletal: Normal range of motion. She exhibits tenderness.  From all extremities. 5/5 strength in all major muscle groups. Tenderness in  cervical and lumbar spine and paraspinal muscles. No tenderness on sacrum or coccyx. No step offs or crepitus in spine.   Neurological: She is alert and oriented to person, place, and time.  Sensation to light and sharp touch in tact.   Skin: Skin is warm and dry. She is not diaphoretic. No erythema.    ED Course  Procedures (including critical care time) Labs Review Labs Reviewed - No data to display  Imaging Review Dg Lumbar Spine Complete  10/27/2013   CLINICAL DATA:  Status post fall 2 days ago.  Back pain.  EXAM: LUMBAR SPINE - COMPLETE 4+ VIEW  COMPARISON:  CT abdomen and pelvis 09/04/2013.  FINDINGS: Vertebral body height and alignment are normal. No pars interarticularis defect is identified. Intervertebral disc space height is maintained. Paraspinous structures are unremarkable.  IMPRESSION: Negative exam.   Electronically Signed   By: Inge Rise M.D.   On: 10/27/2013 20:01   Ct Cervical Spine Wo Contrast  10/27/2013   CLINICAL DATA:  Fall downstairs.  Left neck pain.  EXAM: CT CERVICAL SPINE WITHOUT CONTRAST  TECHNIQUE: Multidetector CT imaging of the cervical spine was performed without intravenous contrast. Multiplanar CT image reconstructions were also generated.  COMPARISON:  11/07/2012  FINDINGS: Normal alignment. Prevertebral soft tissues are normal. No fracture. No epidural or paraspinal hematoma. Early degenerative disc disease changes at C4-5. Small end plate cyst noted in the anterior aspect of C4, possibly a small Schmorl's node or endplate cyst.  IMPRESSION: No acute bony abnormality.   Electronically Signed   By: Rolm Baptise M.D.   On: 10/27/2013 19:56     EKG Interpretation None      MDM   Final diagnoses:  Fall down stairs, initial encounter  Neck strain, initial encounter  Bilateral low back pain without sciatica    Pt is a 38yo female presenting to ED with c/o neck and back pain that started 2 days ago after fall down 14 steps, landing on concrete.  Denies hitting head or LOC. Decreased neck rotation to left due to pain. Pt is tender along midline cervical spine as well as lumbar spine. No red flag symptoms.  No neurological deficits. Patient is ambulatory. No warning symptoms of back pain including: loss of bowel control, no urinary retention. No concern for cauda equina, epidural abscess, or other serious cause of back pain. Conservative measures such as rest, ice/heat and pain medicine indicated with PCP follow-up if no improvement with conservative management.  Will treat as musculoskeletal pain and neck strain. Rx: percocet and valium. Advised to f/u with PCP next week for recheck of symptoms. Home care instructions provided. Return precautions provided. Pt verbalized understanding and agreement with tx plan.    Noland Fordyce, PA-C 10/27/13 2028

## 2013-10-27 NOTE — Discharge Instructions (Signed)
°Emergency Department Resource Guide °1) Find a Doctor and Pay Out of Pocket °Although you won't have to find out who is covered by your insurance plan, it is a good idea to ask around and get recommendations. You will then need to call the office and see if the doctor you have chosen will accept you as a new patient and what types of options they offer for patients who are self-pay. Some doctors offer discounts or will set up payment plans for their patients who do not have insurance, but you will need to ask so you aren't surprised when you get to your appointment. ° °2) Contact Your Local Health Department °Not all health departments have doctors that can see patients for sick visits, but many do, so it is worth a call to see if yours does. If you don't know where your local health department is, you can check in your phone book. The CDC also has a tool to help you locate your state's health department, and many state websites also have listings of all of their local health departments. ° °3) Find a Walk-in Clinic °If your illness is not likely to be very severe or complicated, you may want to try a walk in clinic. These are popping up all over the country in pharmacies, drugstores, and shopping centers. They're usually staffed by nurse practitioners or physician assistants that have been trained to treat common illnesses and complaints. They're usually fairly quick and inexpensive. However, if you have serious medical issues or chronic medical problems, these are probably not your best option. ° °No Primary Care Doctor: °- Call [facility] at  832-8000 - they can help you locate a primary care doctor that  accepts your insurance, provides certain services, etc. °- Physician Referral Service- 1-800-533-3463 ° °Chronic Pain Problems: °Organization         Address  Phone   Notes  °[facility]  (336) 297-2271 Patients need to be referred by their primary care doctor.  ° °Medication  Assistance: °Organization         Address  Phone   Notes  °[place] [facility] [address] °[place], [place] [address] (336) 641-8030 --Must be a resident of [place] °-- Must have NO insurance coverage whatsoever (no Medicaid/ Medicare, etc.) °-- The pt. MUST have a primary care doctor that directs their care regularly and follows them in the community °  °[facility]  (866) 331-1348   °[employer]  (888) 892-1162   ° °Agencies that provide inexpensive medical care: °Organization         Address  Phone   Notes  °[facility]  (336) 832-8035   °[facility]    (336) 832-7272   °[facility] [address] °[place], [place] [address] (336) 832-4777   °[facility] [address], °[place] (336) 271-4999   °[facility]    (336) 373-0678   °[facility]    (336) 272-1050   °[facility] ° [address], [place] Phone:  (336) 832-4444, Fax:  (336) 832-4440 Hours of Operation:  9 am - 6 pm, M-F.  Also accepts Medicaid/Medicare and self-pay.  °[facility] ° [address], [place] Phone: (336) 832-3150, Fax: (336) 832-3151. Hours of Operation:  8:30 am - 5:30 pm, M-F.  Also accepts Medicaid and self-pay.  °[facility] [place] [address], [place] Phone: (336) 878-6027   °[facility] [address], [place], [place] (336)723-1848, Ext. 123 Mondays & Thursdays: 7-9 AM.  First 15 patients are seen on a first come, first serve basis. °  ° °Medicaid-accepting [place] Providers: ° °Organization         Address  Phone   Notes  °[facility] [address], [place] (336) 641-2100 Also accepts self-pay patients.  °[facility] [address], [place] ° (336) 856-9996   °[facility] [address], [place]  (336) 288-8857   °[facility] [address] [place] [address], [place] (336) 299-7000   °Veita Bland [address], [place]  ° (336) 373-1557 Only accepts [place] Access Medicaid patients after they have their name applied to their card.  ° °Self-Pay (no insurance) in [place]: ° °Organization         Address  Phone   Notes  °Sickle Cell Patients, [facility] [address], [place] (336) 832-1970   °[facility] [address], [place] (336) 832-4400   °[facility] ° [address], [place] (336) 992-4800   °[facility]/Dr. Osei-Bonsu ° [address] [place] [address], [place] or [address], [place] (336) 841-8500 Phone number for both [place] and [place] locations is the same.  °[facility] [address], [place] (336) 299-0000   °[facility] [address] [place] [address], [place] or [address] (336) 852-7530 °(336) 878-2260   °[facility] [address], [place] (336) 350-1642, phone; (336) 294-5005, fax Sees patients 1st and 3rd Saturday of every month.  Must not qualify for public or private insurance (i.e. Medicaid, Medicare, [place] Health Choice, Veterans' Benefits) • Household income should be no more than 200% of the poverty level •The clinic cannot treat you if you are pregnant or think you are pregnant • Sexually transmitted diseases are not treated at the clinic.  ° ° °Dental Care: °Organization         Address  Phone  Notes  °[place] [facility] [address], [place] (336) 641-6152 Accepts children up to age 21 who are enrolled in Medicaid or [place] Health Choice; pregnant women with a Medicaid card; and children who have applied for Medicaid or [place] Health Choice, but were declined, whose parents can pay a reduced fee at time of service.  °[place]  [facility] [place]  [address], [place] (336) 641-7733 Accepts children up to age 21 who are enrolled in Medicaid or [place] Health Choice; pregnant women with a Medicaid card; and children who have applied for Medicaid or  Health Choice, but were declined, whose parents can pay a reduced fee at time of service.  °[facility] ° [address],  (336) 641-4533 Patients are seen by appointment only. Walk-ins are not accepted. [facility] will see patients 18 years of age and older. °Monday - Tuesday (8am-5pm) °Most Wednesdays (8:30-5pm) °$30 per visit, cash only  °[facility] ° [address], [place] (336) 641-4533 Patients are seen by appointment only. Walk-ins are not accepted. [facility] will see patients 18 years of age and older. °One   Wednesday Evening (Monthly: Volunteer Based).  $30 per visit, cash only  °[facility]  (919) 537-3737 for adults; Children under age 4, call Graduate Pediatric Dentistry at ([phone]) [phone]. Children aged 4-14, please call (919) [phone] to request a pediatric application. ° Dental services are provided in all areas of dental care including fillings, crowns and bridges, complete and partial dentures, implants, gum treatment, root canals, and extractions. Preventive care is also provided. Treatment is provided to both adults and children. °Patients are selected via a lottery and there is often a waiting list. °  °[facility] [address], °[place] ° (336) 763-8833 [email or web address] °  °[facility] [address], [place], [place] (336)723-1848, Ext. 123 Second and Fourth Thursday of each month, opens at 6:30 AM; Clinic ends at 9 AM.  Patients are seen on a first-come first-served basis, and a limited number are seen during each clinic.  ° °[facility] ° [address], [place], [place] (336) 723-7904    Eligibility Requirements °You must have lived in [place], [place], or [place] counties for at least the last three months. °  You cannot be eligible for state or federal sponsored healthcare insurance, including Veterans Administration, Medicaid, or Medicare. °  You generally cannot be eligible for healthcare insurance through your employer.  °  How to apply: °Eligibility screenings are held every Tuesday and Wednesday afternoon from 1:00 pm until 4:00 pm. You do not need an appointment for the interview!  °[facility] [address], [place], [place] 336-631-2330   °[facility]  336-342-8273   °[facility]  336-703-3100   °[facility]  336-570-6415   ° °Behavioral Health Resources in the Community: °Intensive Outpatient Programs °Organization         Address  Phone  Notes  °[facility] [address], [place], [place] 336-878-6098   °[facility] [address], [place], [place] 336-832-9800   °[facility] [address], [place], [place] ° 336-882-2125   °[facility] [address],  °[place], [place] 1-800-853-5163 or 336-641-4981   °Substance Abuse Resources °Organization         Address  Phone  Notes  °[facility]  336-882-2125   °[facility]  336-784-9470   °[facility]  336-285-9073   °[facility]  336-845-3988   °Residential & Outpatient Substance Abuse Program  1-800-659-3381   °Psychological Services °Organization         Address  Phone  Notes  °[facility]  336- 832-9600   °[facility]  336- 378-7881   °[facility] [address], [place] 1-800-853-5163 or 336-641-4981   ° °Mobile Crisis Teams °Organization         Address  Phone  Notes  °[facility], Mobile Crisis Care Unit  1-877-626-1772   °Assertive °[facility] ° [address]  [place],  336-834-9664   °Sharon DeEsch [address] °[place]  336-554-5454   ° °Self-Help/Support Groups °Organization         Address  Phone             Notes  °[facility] - variety of support groups  336- 373-1402 Call for more information  °Narcotics Anonymous (NA), [facility] [address], °[place]   2 meetings at this location  ° °  Residential Treatment Programs °Organization         Address  Phone  Notes  °[facility] [address],    °[place]  1-866-801-8205   °[facility] ° [address], [place], [place] 704-293-8524   °[facility] [address], [place] 336-845-3988 Admissions: 8am-3pm M-F  °[facility] [address]    °[place], [place] 336-841-1104   °[facility] [address], [place], [place] 336-379-7146   °[facility] [address]  °[place], [place] 336-285-9073   °[facility] Intensive Outpatient [address], [place], [place] 336-852-3033   °[facility] [address]  °[place], [place] 1-877-615-2722 or 336-784-9470   °[facility] [address] , [place] 336-227-7417 Accepts Medicaid  °[facility] [address]  °[place] 1-800-659-3381 Substance Abuse/Addiction Treatment  ° °[place] Behavioral Health Resources °Organization         Address  Phone  Notes  °[facility]  (888) 581-9988   °Julie Brannon, PhD [address] [place], [place]   (336) 349-5553 or (336) 951-0000   °[facility]   [address] °[place], [place] (336) 349-4454   °[facility] [address], [place], [place] (336) 342-8316 Insurance/Medicaid/sponsorship through [facility]  °[facility] [address]                                    [place], [place] (336) 342-8316 Therapy/tele-psych/case    °[facility] [address]  ° [place], [place] (336) 349-2233    °Dr. Arfeen  (336) 349-4544   °[facility] [place]  [employer] [place] [facility] 1) [address],  °2) [address], [place] °3)  [address], [place] (336) 349-3220 °(336) 342-7768 ° °(336) 342-8140   °[place] [facility] (336) 342-1394 or (336) 342-3537 (After Hours)    ° ° °

## 2013-10-28 NOTE — ED Provider Notes (Signed)
Medical screening examination/treatment/procedure(s) were performed by non-physician practitioner and as supervising physician I was immediately available for consultation/collaboration.   EKG Interpretation None        Debby Freiberg, MD 10/28/13 1558

## 2013-11-03 ENCOUNTER — Emergency Department (HOSPITAL_COMMUNITY): Payer: Medicare Other

## 2013-11-03 ENCOUNTER — Encounter (HOSPITAL_COMMUNITY): Payer: Self-pay | Admitting: Emergency Medicine

## 2013-11-03 ENCOUNTER — Emergency Department (HOSPITAL_COMMUNITY)
Admission: EM | Admit: 2013-11-03 | Discharge: 2013-11-03 | Disposition: A | Discharge status: Home / Self Care | Payer: Medicare Other | Attending: Emergency Medicine | Admitting: Emergency Medicine

## 2013-11-03 DIAGNOSIS — I129 Hypertensive chronic kidney disease with stage 1 through stage 4 chronic kidney disease, or unspecified chronic kidney disease: Secondary | ICD-10-CM | POA: Insufficient documentation

## 2013-11-03 DIAGNOSIS — Z992 Dependence on renal dialysis: Secondary | ICD-10-CM | POA: Diagnosis not present

## 2013-11-03 DIAGNOSIS — J45909 Unspecified asthma, uncomplicated: Secondary | ICD-10-CM | POA: Diagnosis not present

## 2013-11-03 DIAGNOSIS — N189 Chronic kidney disease, unspecified: Secondary | ICD-10-CM | POA: Insufficient documentation

## 2013-11-03 DIAGNOSIS — W19XXXA Unspecified fall, initial encounter: Secondary | ICD-10-CM

## 2013-11-03 DIAGNOSIS — Y9289 Other specified places as the place of occurrence of the external cause: Secondary | ICD-10-CM | POA: Diagnosis not present

## 2013-11-03 DIAGNOSIS — Z79899 Other long term (current) drug therapy: Secondary | ICD-10-CM | POA: Insufficient documentation

## 2013-11-03 DIAGNOSIS — IMO0002 Reserved for concepts with insufficient information to code with codable children: Secondary | ICD-10-CM | POA: Diagnosis not present

## 2013-11-03 DIAGNOSIS — Z7982 Long term (current) use of aspirin: Secondary | ICD-10-CM | POA: Diagnosis not present

## 2013-11-03 DIAGNOSIS — S0990XA Unspecified injury of head, initial encounter: Secondary | ICD-10-CM | POA: Insufficient documentation

## 2013-11-03 DIAGNOSIS — K219 Gastro-esophageal reflux disease without esophagitis: Secondary | ICD-10-CM | POA: Insufficient documentation

## 2013-11-03 DIAGNOSIS — Y9389 Activity, other specified: Secondary | ICD-10-CM | POA: Diagnosis not present

## 2013-11-03 DIAGNOSIS — W010XXA Fall on same level from slipping, tripping and stumbling without subsequent striking against object, initial encounter: Secondary | ICD-10-CM | POA: Insufficient documentation

## 2013-11-03 DIAGNOSIS — S3992XA Unspecified injury of lower back, initial encounter: Secondary | ICD-10-CM

## 2013-11-03 MED ORDER — HYDROMORPHONE HCL PF 1 MG/ML IJ SOLN
1.0000 mg | Freq: Once | INTRAMUSCULAR | Status: AC
  Administered 2013-11-03: 1 mg via INTRAMUSCULAR
  Filled 2013-11-03: qty 1

## 2013-11-03 MED ORDER — OXYCODONE-ACETAMINOPHEN 5-325 MG PO TABS
2.0000 | ORAL_TABLET | Freq: Once | ORAL | Status: AC
  Administered 2013-11-03: 2 via ORAL

## 2013-11-03 MED ORDER — OXYCODONE-ACETAMINOPHEN 5-325 MG PO TABS: 2.0000 | ORAL_TABLET | ORAL | Status: DC | PRN

## 2013-11-03 MED ORDER — CYCLOBENZAPRINE HCL 10 MG PO TABS: 10.0000 mg | ORAL_TABLET | Freq: Two times a day (BID) | ORAL | Status: DC | PRN

## 2013-11-03 NOTE — ED Provider Notes (Signed)
CSN: [id number]     Arrival date & time 11/03/13  1848 History   First MD Initiated Contact with Patient 11/03/13 2006     Chief Complaint  Patient presents with  . Back Pain     (Consider location/radiation/quality/duration/timing/severity/associated sxs/prior Treatment) HPI Comments: Patient is a 38 year old female with a past medical history of CKD who presents after a fall that occurred last night. Patient reports getting out of the bathtub when she slipped and fell backwards. She reports hitting her head and denies LOC. Patient reports "seeing black" out of both eyes for "a few minutes" after she fell. She also complains of back pain that is aching and severe without radiation. Patient also complains of "wood chopping" in her ears. She did not try anything at home for pain. Movement makes the pain worse. No alleviating factors.    Past Medical History  Diagnosis Date  . Blood transfusion   . Preterm labor   . Dialysis patient   . Chronic kidney disease   . Hypertension   . Asthma     as a child  . Headache(784.0)   . GERD (gastroesophageal reflux disease)     nexium prn  . Renal insufficiency    Past Surgical History  Procedure Laterality Date  . Renal biopsy    . Dilation and curettage of uterus    . Dilation and evacuation  08/02/2011    Procedure: DILATATION AND EVACUATION;  Surgeon: Logan Bores, MD;  Location: [facility];  Service: Gynecology;;  . Av fistula placement Left 2010  . Revision of arteriovenous goretex graft Left 02/11/2013    Procedure: REVISION OF ARTERIOVENOUS GORTEX FISTULA;  Surgeon: Rosetta Posner, MD;  Location: [facility];  Service: Vascular;  Laterality: Left;  . Dilation and evacuation N/A 08/31/2013    Procedure: DILATATION AND EVACUATION;  Surgeon: Woodroe Mode, MD;  Location: [facility];  Service: Gynecology;  Laterality: N/A;   Family History  Problem Relation Age of Onset  . Hypertension    . Diabetes type II    . Diabetes Mother   .  Hyperlipidemia Mother   . Hypertension Mother   . Heart disease Mother    History  Substance Use Topics  . Smoking status: Passive Smoke Exposure - Never Smoker  . Smokeless tobacco: Never Used  . Alcohol Use: No   OB History   Grav Para Term Preterm Abortions TAB SAB Ect Mult Living   6 1 1  0 4 2 2  0 0 1     Review of Systems  Constitutional: Negative for fever, chills and fatigue.  HENT: Negative for trouble swallowing.   Eyes: Negative for visual disturbance.  Respiratory: Negative for shortness of breath.   Cardiovascular: Negative for chest pain and palpitations.  Gastrointestinal: Negative for nausea, vomiting, abdominal pain and diarrhea.  Genitourinary: Negative for dysuria and difficulty urinating.  Musculoskeletal: Positive for back pain. Negative for arthralgias and neck pain.  Skin: Negative for color change.  Neurological: Positive for headaches. Negative for dizziness and weakness.  Psychiatric/Behavioral: Negative for dysphoric mood.      Allergies  Morphine and related; Prednisone; and Tuna  Home Medications   Prior to Admission medications   Medication Sig Start Date End Date Taking? Authorizing Provider  aspirin 81 MG chewable tablet Chew 162 mg by mouth daily.    Historical Provider, MD  calcium acetate (PHOSLO) 667 MG capsule Take 2,668 mg by mouth 3 (three) times daily with meals.  Historical Provider, MD  diazepam (VALIUM) 5 MG tablet Take 1 tablet (5 mg total) by mouth every 12 (twelve) hours as needed for muscle spasms. 10/27/13   Noland Fordyce, PA-C  diphenhydrAMINE (BENADRYL) 25 MG tablet Take 50 mg by mouth daily.     Historical Provider, MD  gabapentin (NEURONTIN) 100 MG capsule Take 200 mg by mouth at bedtime.    Historical Provider, MD  multivitamin (RENA-VIT) TABS tablet Take 1 tablet by mouth at bedtime. 09/15/13   Janece Canterbury, MD  oxyCODONE-acetaminophen (PERCOCET/ROXICET) 5-325 MG per tablet Take 1 tablet by mouth every 6 (six) hours as  needed for moderate pain or severe pain. 10/27/13   Noland Fordyce, PA-C  pantoprazole (PROTONIX) 40 MG tablet Take 1 tablet (40 mg total) by mouth daily. 08/31/13   Domenic Polite, MD  sevelamer carbonate (RENVELA) 2.4 G PACK Take 2.4 g by mouth 3 (three) times daily with meals.    Historical Provider, MD  zolpidem (AMBIEN) 5 MG tablet Take 1-2 tablets (5-10 mg total) by mouth at bedtime as needed for sleep. 08/22/13   Osborne Oman, MD   BP 154/90  Pulse 90  Temp(Src) 98.7 F (37.1 C) (Oral)  Resp 18  SpO2 95%  LMP 09/27/2013 Physical Exam  Nursing note and vitals reviewed. Constitutional: She is oriented to person, place, and time. She appears well-developed and well-nourished. No distress.  HENT:  Head: Normocephalic and atraumatic.  Right Ear: External ear normal.  Left Ear: External ear normal.  Bilateral TM intact without bulging or erythema.   Eyes: Conjunctivae and EOM are normal. Pupils are equal, round, and reactive to light.  Neck: Normal range of motion.  Cardiovascular: Normal rate and regular rhythm.  Exam reveals no gallop and no friction rub.   No murmur heard. Pulmonary/Chest: Effort normal and breath sounds normal. She has no wheezes. She has no rales. She exhibits no tenderness.  Abdominal: Soft. She exhibits no distension. There is no tenderness. There is no rebound and no guarding.  Musculoskeletal: Normal range of motion.  L3-L5 tenderness to palpation. No other midline spine tenderness. Stable pelvis without tenderness to palpation. No paraspinal tenderness to palpation. Left trapezius tenderness to palpation.   Neurological: She is alert and oriented to person, place, and time. No cranial nerve deficit. Coordination normal.  Extremity strength and sensation equal and intact bilaterally. Speech is goal-oriented. Moves limbs without ataxia.   Skin: Skin is warm and dry.  Psychiatric: She has a normal mood and affect. Her behavior is normal.    ED Course   Procedures (including critical care time) Labs Review Labs Reviewed - No data to display  Imaging Review Dg Lumbar Spine 2-3 Views  11/03/2013   CLINICAL DATA:  Back pain.  Recent fall  EXAM: LUMBAR SPINE - 2-3 VIEW  COMPARISON:  10/27/2013  FINDINGS: There is no evidence of lumbar spine fracture. Alignment is normal. Intervertebral disc spaces are maintained.  IMPRESSION: Negative.   Electronically Signed   By: Jorje Guild M.D.   On: 11/03/2013 21:57   Ct Head Wo Contrast  11/03/2013   CLINICAL DATA:  38 year old female status post fall with posterior head injury. Headache and visual changes. Initial encounter.  EXAM: CT HEAD WITHOUT CONTRAST  TECHNIQUE: Contiguous axial images were obtained from the base of the skull through the vertex without intravenous contrast.  COMPARISON:  Cervical spine CT 10/27/2013.  Neck CT 11/07/2012.  FINDINGS: No scalp hematoma identified. Visualized orbit soft tissues are within normal limits.  Visualized paranasal sinuses and mastoids are clear. Calvarium intact.  Cerebral volume is normal. No midline shift, ventriculomegaly, mass effect, evidence of mass lesion, intracranial hemorrhage or evidence of cortically based acute infarction. Gray-white matter differentiation is within normal limits throughout the brain. No suspicious intracranial vascular hyperdensity.  IMPRESSION: Normal non contrast appearance of the brain. No acute traumatic injury identified.   Electronically Signed   By: Lars Pinks M.D.   On: 11/03/2013 21:12     EKG Interpretation None      MDM   Final diagnoses:  Fall, initial encounter  Head injury, initial encounter  Back injury, initial encounter    9:09 PM CT head and lumbar spine plain film pending. Vitals stable and patient afebrile. Patient will have Percocet here for pain. No neuro deficits noted at this time.   10:34 PM Patient's imaging unremarkable for acute changes. Patient will have 1mg  IM dilaudid for pain. Patient  will be discharged with Percocet and Flexeril. Patient advised to follow up with PCP if symptoms persist. Vitals stable and patient afebrile.    Alvina Chou, PA-C 11/03/13 2328

## 2013-11-03 NOTE — ED Notes (Signed)
Pt reports posterior neck pain, back pain, and headache since last night after falling out of her bathtub. Pt also reports hearing "chopping wood noises in her ears."

## 2013-11-03 NOTE — ED Notes (Signed)
Pt discharged home with all belongings, pt alert, oriented and ambulatory upon discharge. 2 new rx prescribed, pt verbalizes understanding of discharge instructions. Pt driven home by family at bedside

## 2013-11-03 NOTE — ED Notes (Signed)
Patient transported to CT 

## 2013-11-03 NOTE — Discharge Instructions (Signed)
Take Percocet as needed for pain. Take Flexeril as needed for muscle spasm. Refer to attached documents for more information. Follow up with your doctor if symptoms persist.

## 2013-11-03 NOTE — ED Notes (Signed)
Patient transported to X-ray 

## 2013-11-03 NOTE — ED Notes (Addendum)
Presents post fall from last night while getting out of bathtub, reports stepping out and falling backward and hitting back and left side of neck. Hit head, denies LOC, reports that since fall everything in ears is echoing and popping. Able to ambulate and cms intact. Denies headache.

## 2013-11-14 NOTE — ED Provider Notes (Signed)
Medical screening examination/treatment/procedure(s) were performed by non-physician practitioner and as supervising physician I was immediately available for consultation/collaboration.   EKG Interpretation None       Babette Relic, MD 11/14/13 2022

## 2013-12-20 ENCOUNTER — Emergency Department (HOSPITAL_COMMUNITY): Payer: Medicare Other

## 2013-12-20 ENCOUNTER — Encounter (HOSPITAL_COMMUNITY): Payer: Self-pay | Admitting: Emergency Medicine

## 2013-12-20 ENCOUNTER — Emergency Department (HOSPITAL_COMMUNITY)
Admission: EM | Admit: 2013-12-20 | Discharge: 2013-12-20 | Disposition: A | Discharge status: Home / Self Care | Payer: Medicare Other | Attending: Emergency Medicine | Admitting: Emergency Medicine

## 2013-12-20 DIAGNOSIS — Z7982 Long term (current) use of aspirin: Secondary | ICD-10-CM | POA: Diagnosis not present

## 2013-12-20 DIAGNOSIS — R110 Nausea: Secondary | ICD-10-CM | POA: Diagnosis not present

## 2013-12-20 DIAGNOSIS — H5711 Ocular pain, right eye: Secondary | ICD-10-CM | POA: Insufficient documentation

## 2013-12-20 DIAGNOSIS — R1013 Epigastric pain: Secondary | ICD-10-CM | POA: Diagnosis not present

## 2013-12-20 DIAGNOSIS — N186 End stage renal disease: Secondary | ICD-10-CM | POA: Insufficient documentation

## 2013-12-20 DIAGNOSIS — R42 Dizziness and giddiness: Secondary | ICD-10-CM | POA: Diagnosis not present

## 2013-12-20 DIAGNOSIS — Z79899 Other long term (current) drug therapy: Secondary | ICD-10-CM | POA: Diagnosis not present

## 2013-12-20 DIAGNOSIS — R1011 Right upper quadrant pain: Secondary | ICD-10-CM | POA: Diagnosis not present

## 2013-12-20 DIAGNOSIS — I120 Hypertensive chronic kidney disease with stage 5 chronic kidney disease or end stage renal disease: Secondary | ICD-10-CM | POA: Insufficient documentation

## 2013-12-20 DIAGNOSIS — J45909 Unspecified asthma, uncomplicated: Secondary | ICD-10-CM | POA: Diagnosis not present

## 2013-12-20 DIAGNOSIS — K219 Gastro-esophageal reflux disease without esophagitis: Secondary | ICD-10-CM | POA: Insufficient documentation

## 2013-12-20 DIAGNOSIS — M549 Dorsalgia, unspecified: Secondary | ICD-10-CM | POA: Diagnosis present

## 2013-12-20 DIAGNOSIS — Z992 Dependence on renal dialysis: Secondary | ICD-10-CM | POA: Diagnosis not present

## 2013-12-20 DIAGNOSIS — R51 Headache: Secondary | ICD-10-CM | POA: Insufficient documentation

## 2013-12-20 LAB — URINE MICROSCOPIC-ADD ON

## 2013-12-20 LAB — MAGNESIUM: MAGNESIUM: 2 mg/dL (ref 1.5–2.5)

## 2013-12-20 LAB — URINALYSIS, ROUTINE W REFLEX MICROSCOPIC
BILIRUBIN URINE: NEGATIVE
Glucose, UA: 100 mg/dL — AB
Ketones, ur: NEGATIVE mg/dL
NITRITE: NEGATIVE
PROTEIN: 100 mg/dL — AB
Specific Gravity, Urine: 1.009 (ref 1.005–1.030)
UROBILINOGEN UA: 0.2 mg/dL (ref 0.0–1.0)
pH: 8 (ref 5.0–8.0)

## 2013-12-20 LAB — CBC WITH DIFFERENTIAL/PLATELET
BASOS PCT: 0 % (ref 0–1)
Basophils Absolute: 0 10*3/uL (ref 0.0–0.1)
EOS ABS: 0.4 10*3/uL (ref 0.0–0.7)
Eosinophils Relative: 4 % (ref 0–5)
HCT: 33.1 % — ABNORMAL LOW (ref 36.0–46.0)
Hemoglobin: 10.7 g/dL — ABNORMAL LOW (ref 12.0–15.0)
Lymphocytes Relative: 35 % (ref 12–46)
Lymphs Abs: 3.4 10*3/uL (ref 0.7–4.0)
MCH: 30 pg (ref 26.0–34.0)
MCHC: 32.3 g/dL (ref 30.0–36.0)
MCV: 92.7 fL (ref 78.0–100.0)
MONOS PCT: 7 % (ref 3–12)
Monocytes Absolute: 0.6 10*3/uL (ref 0.1–1.0)
NEUTROS PCT: 54 % (ref 43–77)
Neutro Abs: 5.2 10*3/uL (ref 1.7–7.7)
Platelets: 208 10*3/uL (ref 150–400)
RBC: 3.57 MIL/uL — ABNORMAL LOW (ref 3.87–5.11)
RDW: 15.1 % (ref 11.5–15.5)
WBC: 9.6 10*3/uL (ref 4.0–10.5)

## 2013-12-20 LAB — COMPREHENSIVE METABOLIC PANEL
ALT: <5 U/L (ref 0–35)
ANION GAP: 23 — AB (ref 5–15)
AST: 7 U/L (ref 0–37)
Albumin: 3.4 g/dL — ABNORMAL LOW (ref 3.5–5.2)
Alkaline Phosphatase: 58 U/L (ref 39–117)
BUN: 55 mg/dL — AB (ref 6–23)
CO2: 22 mEq/L (ref 19–32)
Calcium: 8.8 mg/dL (ref 8.4–10.5)
Chloride: 93 mEq/L — ABNORMAL LOW (ref 96–112)
Creatinine, Ser: 16.25 mg/dL — ABNORMAL HIGH (ref 0.50–1.10)
GFR calc Af Amer: 3 mL/min — ABNORMAL LOW (ref >90)
GFR calc non Af Amer: 2 mL/min — ABNORMAL LOW (ref >90)
Glucose, Bld: 87 mg/dL (ref 70–99)
Potassium: 4.3 mEq/L (ref 3.7–5.3)
Sodium: 138 mEq/L (ref 137–147)
TOTAL PROTEIN: 7.7 g/dL (ref 6.0–8.3)
Total Bilirubin: 0.3 mg/dL (ref 0.3–1.2)

## 2013-12-20 LAB — PHOSPHORUS: PHOSPHORUS: 8.1 mg/dL — AB (ref 2.3–4.6)

## 2013-12-20 LAB — LIPASE, BLOOD: Lipase: 94 U/L — ABNORMAL HIGH (ref 11–59)

## 2013-12-20 LAB — I-STAT CG4 LACTIC ACID, ED: Lactic Acid, Venous: 0.69 mmol/L (ref 0.5–2.2)

## 2013-12-20 MED ORDER — IOHEXOL 300 MG/ML  SOLN
100.0000 mL | Freq: Once | INTRAMUSCULAR | Status: AC | PRN
  Administered 2013-12-20: 100 mL via INTRAVENOUS

## 2013-12-20 MED ORDER — HYDROCODONE-ACETAMINOPHEN 5-325 MG PO TABS
2.0000 | ORAL_TABLET | Freq: Once | ORAL | Status: AC
  Administered 2013-12-20: 2 via ORAL
  Filled 2013-12-20: qty 2

## 2013-12-20 MED ORDER — IOHEXOL 300 MG/ML  SOLN
50.0000 mL | Freq: Once | INTRAMUSCULAR | Status: AC | PRN
  Administered 2013-12-20: 50 mL via ORAL

## 2013-12-20 MED ORDER — OXYCODONE-ACETAMINOPHEN 5-325 MG PO TABS: 1.0000 | ORAL_TABLET | ORAL | Status: DC | PRN

## 2013-12-20 MED ORDER — RANITIDINE HCL 150 MG PO TABS: 150.0000 mg | ORAL_TABLET | Freq: Two times a day (BID) | ORAL | Status: DC

## 2013-12-20 MED ORDER — HYDROMORPHONE HCL 1 MG/ML IJ SOLN
1.0000 mg | Freq: Once | INTRAMUSCULAR | Status: AC
  Administered 2013-12-20: 1 mg via INTRAVENOUS
  Filled 2013-12-20: qty 1

## 2013-12-20 NOTE — ED Provider Notes (Signed)
CSN: [id number]     Arrival date & time 12/20/13  0011 History   First MD Initiated Contact with Patient 12/20/13 0406     Chief Complaint  Patient presents with  . Headache  . Back Pain     (Consider location/radiation/quality/duration/timing/severity/associated sxs/prior Treatment) HPI Comments: Pt comes in with cc of headache, back pain. Hx of ESRD, HD - M/W/F - LUE fistula. Pt reports that she started having headache and back pain on Monday evening. Pt's back pain is in the right flank region. Pain is constant, sharp/stabbing, and has no specific aggravating or relieving factors. + nausea, no known fevers, chills -temp here is 99.5. No diarrhea, no hx of renal stone. Pt is on her period. Headache is throbbing, around her right eye, and started on Monday as well. Headache is mild, and the back pain is worse than her headache. Headache is also constant. There is no photophobia, + phonophobia. Pt has no new blurry vision, no new double vision - but pt states that she was walking "sideways" this evening. No hx of strokes. + dizziness intermittently, described as spinning sensation. As we speak pt has no dizziness.  Patient is a 38 y.o. female presenting with headaches and back pain. The history is provided by the patient.  Headache Associated symptoms: back pain, dizziness and nausea   Associated symptoms: no abdominal pain, no cough, no diarrhea, no neck pain and no vomiting   Back Pain Associated symptoms: headaches   Associated symptoms: no abdominal pain and no chest pain     Past Medical History  Diagnosis Date  . Blood transfusion   . Preterm labor   . Dialysis patient   . Chronic kidney disease   . Hypertension   . Asthma     as a child  . Headache(784.0)   . GERD (gastroesophageal reflux disease)     nexium prn  . Renal insufficiency    Past Surgical History  Procedure Laterality Date  . Renal biopsy    . Dilation and curettage of uterus    . Dilation and evacuation   08/02/2011    Procedure: DILATATION AND EVACUATION;  Surgeon: Logan Bores, MD;  Location: [facility];  Service: Gynecology;;  . Av fistula placement Left 2010  . Revision of arteriovenous goretex graft Left 02/11/2013    Procedure: REVISION OF ARTERIOVENOUS GORTEX FISTULA;  Surgeon: Rosetta Posner, MD;  Location: [facility];  Service: Vascular;  Laterality: Left;  . Dilation and evacuation N/A 08/31/2013    Procedure: DILATATION AND EVACUATION;  Surgeon: Woodroe Mode, MD;  Location: [facility];  Service: Gynecology;  Laterality: N/A;   Family History  Problem Relation Age of Onset  . Hypertension    . Diabetes type II    . Diabetes Mother   . Hyperlipidemia Mother   . Hypertension Mother   . Heart disease Mother    History  Substance Use Topics  . Smoking status: Passive Smoke Exposure - Never Smoker  . Smokeless tobacco: Never Used  . Alcohol Use: No   OB History   Grav Para Term Preterm Abortions TAB SAB Ect Mult Living   6 1 1  0 4 2 2  0 0 1     Review of Systems  Constitutional: Positive for activity change.  HENT: Negative for facial swelling.   Respiratory: Negative for cough, shortness of breath and wheezing.   Cardiovascular: Negative for chest pain.  Gastrointestinal: Positive for nausea. Negative for vomiting, abdominal pain, diarrhea, constipation,  blood in stool and abdominal distention.  Genitourinary: Negative for hematuria and difficulty urinating.  Musculoskeletal: Positive for back pain. Negative for neck pain.  Skin: Negative for color change and rash.  Neurological: Positive for dizziness, light-headedness and headaches. Negative for syncope and speech difficulty.  Hematological: Does not bruise/bleed easily.  Psychiatric/Behavioral: Negative for confusion.      Allergies  Morphine and related; Prednisone; and Tuna  Home Medications   Prior to Admission medications   Medication Sig Start Date End Date Taking? Authorizing Provider  aspirin 81 MG chewable  tablet Chew 162 mg by mouth daily.   Yes Historical Provider, MD  calcium acetate (PHOSLO) 667 MG capsule Take 2,668 mg by mouth 3 (three) times daily with meals.   Yes Historical Provider, MD  cyclobenzaprine (FLEXERIL) 10 MG tablet Take 1 tablet (10 mg total) by mouth 2 (two) times daily as needed for muscle spasms. 11/03/13  Yes Kaitlyn Szekalski, PA-C  diphenhydrAMINE (BENADRYL) 25 MG tablet Take 50 mg by mouth daily.    Yes Historical Provider, MD  esomeprazole (NEXIUM) 40 MG capsule Take 40 mg by mouth daily at 12 noon.   Yes Historical Provider, MD  gabapentin (NEURONTIN) 100 MG capsule Take 200 mg by mouth at bedtime.   Yes Historical Provider, MD  multivitamin (RENA-VIT) TABS tablet Take 1 tablet by mouth at bedtime. 09/15/13  Yes Janece Canterbury, MD  sevelamer carbonate (RENVELA) 2.4 G PACK Take 2.4 g by mouth 3 (three) times daily with meals.   Yes Historical Provider, MD  zolpidem (AMBIEN) 5 MG tablet Take 1-2 tablets (5-10 mg total) by mouth at bedtime as needed for sleep. 08/22/13  Yes Osborne Oman, MD  oxyCODONE-acetaminophen (PERCOCET/ROXICET) 5-325 MG per tablet Take 1 tablet by mouth every 4 (four) hours as needed for moderate pain or severe pain. 12/20/13   Varney Biles, MD  ranitidine (ZANTAC) 150 MG tablet Take 1 tablet (150 mg total) by mouth 2 (two) times daily. 12/20/13   Anshu Wehner Kathrynn Humble, MD   BP 147/92  Pulse 86  Temp(Src) 98.6 F (37 C) (Oral)  Resp 16  Ht 5\' 5"  (1.651 m)  Wt 171 lb 15.3 oz (78 kg)  BMI 28.62 kg/m2  SpO2 96%  LMP 12/18/2013 Physical Exam  Nursing note and vitals reviewed. Constitutional: She is oriented to person, place, and time. She appears well-developed.  HENT:  Head: Normocephalic and atraumatic.  Eyes: Conjunctivae and EOM are normal. Pupils are equal, round, and reactive to light.  Neck: Normal range of motion. Neck supple.  Cardiovascular: Normal rate, regular rhythm and normal heart sounds.   Pulmonary/Chest: Effort normal and breath  sounds normal. No respiratory distress.  Abdominal: Soft. Bowel sounds are normal. She exhibits no distension. There is tenderness. There is guarding. There is no rebound.  RUQ tenderness.   Musculoskeletal:  Pt has NO tenderness over the spine. No step offs, no erythema. Pt has 2+ patellar reflex bilaterally. Able to discriminate between sharp and dull. Able to ambulate   Neurological: She is alert and oriented to person, place, and time. No cranial nerve deficit. Coordination normal.  Cerebellar exam is normal (finger to nose) Sensory exam normal for bilateral upper and lower extremities - and patient is able to discriminate between sharp and dull. Motor exam is 4+/5   Skin: Skin is warm and dry.    ED Course  Procedures (including critical care time) Labs Review Labs Reviewed  CBC WITH DIFFERENTIAL - Abnormal; Notable for the following:  RBC 3.57 (*)    Hemoglobin 10.7 (*)    HCT 33.1 (*)    All other components within normal limits  COMPREHENSIVE METABOLIC PANEL - Abnormal; Notable for the following:    Chloride 93 (*)    BUN 55 (*)    Creatinine, Ser 16.25 (*)    Albumin 3.4 (*)    GFR calc non Af Amer 2 (*)    GFR calc Af Amer 3 (*)    Anion gap 23 (*)    All other components within normal limits  PHOSPHORUS - Abnormal; Notable for the following:    Phosphorus 8.1 (*)    All other components within normal limits  LIPASE, BLOOD - Abnormal; Notable for the following:    Lipase 94 (*)    All other components within normal limits  URINALYSIS, ROUTINE W REFLEX MICROSCOPIC - Abnormal; Notable for the following:    APPearance CLOUDY (*)    Glucose, UA 100 (*)    Hgb urine dipstick LARGE (*)    Protein, ur 100 (*)    Leukocytes, UA TRACE (*)    All other components within normal limits  URINE MICROSCOPIC-ADD ON - Abnormal; Notable for the following:    Squamous Epithelial / LPF MANY (*)    Bacteria, UA MANY (*)    All other components within normal limits   MAGNESIUM  I-STAT CG4 LACTIC ACID, ED    Imaging Review Ct Abdomen Pelvis W Contrast  12/20/2013   CLINICAL DATA:  Right upper quadrant and right flank and epigastric pain ; elevated lipase ; history of dialysis dependent renal failure, hypertension, asthma  EXAM: CT ABDOMEN AND PELVIS WITH CONTRAST  TECHNIQUE: Multidetector CT imaging of the abdomen and pelvis was performed using the standard protocol following bolus administration of intravenous contrast.  CONTRAST:  132mL OMNIPAQUE IOHEXOL 300 MG/ML SOLN intravenously ; the patient also received oral contrast material.  COMPARISON:  CT scan of the abdomen and pelvis of September 04, 2013  FINDINGS: The liver, spleen, pancreas, and stomach are unremarkable. The gallbladder is only partially distended. No stones are evident. There is no peripancreatic inflammatory change. The adrenal glands are normal. The right kidney is atrophic. There is a stable 6.2 cm diameter cyst associated with mid and lower pole of the left kidney. The periaortic and pericaval regions are normal.  The small bowel exhibits a normal contrast and gas pattern. The appendix and terminal ileum are normal. The colon exhibits a normal stool and gas pattern. The urinary bladder is normal. There are pelvic phlebolithslvis. The uterus is mildly enlarged but stable. There are cystic adnexal processes bilaterally which are little changed. A rim calcified cystic left adnexal structure is no longer evident. There is no ascites. There is no inguinal nor significant umbilical hernia. The lung bases are clear. The lumbar spine and pelvis exhibit no acute abnormalities.  IMPRESSION: 1. The gallbladder is contracted without evidence of stones. This may reflect recent ingestion of a meal but the stomach is empty. The possibility of cholecystitis is raised. The pancreas exhibits no discrete mass or inflammatory change. 2. There is no acute hepatobiliary abnormality. 3. There are chronic atrophic changes of  the kidneys and there is stable cystic change of the mid and lower pole of the left kidney.   Electronically Signed   By: David  Martinique   On: 12/20/2013 08:13     EKG Interpretation None      MDM   Final diagnoses:  Right upper quadrant  abdominal pain  Epigastric pain    DDx includes: Pancreatitis Hepatobiliary pathology including cholecystitis Gastritis/PUD SBO ACS syndrome Aortic Dissection Renal mass  PT comes in with abd pain and back pain. Pt has ESRD, and is on HD. She has constant pain, 0 SIRS criteria, and on exam RUQ and epigastric tenderness, with some guarding, pain worse over the RUQ. CT ordered  -to r/o any serious infection, renal mass/tumors, hernia, cholecystitis, and it is neg.  Pt also has headache and intermittent dizziness. Headache is mild, tension type headache. She has no neuro deficits on my exam. TIA/Stroke considered in the ddx, but unlikely. Cerebellar exam is normal, and there is no meningeal signs. No indication for CT head or further neuro workup.   8:56 AM PT has persistent abd pain. CT is neg. Will give oral percocet and pepcid. Will discharge. Might need GI workup - HIDA? EGD?  Asked pt to return to the ER if sx not improving.    Varney Biles, MD 12/20/13 [phone]

## 2013-12-20 NOTE — ED Notes (Signed)
RESTRICTED ARM LEFT SIDE PINK ARM BAND APPLIED- DIALYSIS

## 2013-12-20 NOTE — ED Notes (Signed)
Patient transported to CT 

## 2013-12-20 NOTE — Discharge Instructions (Signed)
We saw you in the ER for the abdominal pain. All of our results are normal, including all labs and imaging. We are not sure what is causing your abdominal pain, and recommend that you see your primary care doctor within 2-3 days for further evaluation, and possibly GI (stomach) doctor. If your symptoms get worse, return to the ER. Take the pain meds and nausea meds as prescribed.   Abdominal Pain, Women Abdominal (stomach, pelvic, or belly) pain can be caused by many things. It is important to tell your doctor:  The location of the pain.  Does it come and go or is it present all the time?  Are there things that start the pain (eating certain foods, exercise)?  Are there other symptoms associated with the pain (fever, nausea, vomiting, diarrhea)? All of this is helpful to know when trying to find the cause of the pain. CAUSES   Stomach: virus or bacteria infection, or ulcer.  Intestine: appendicitis (inflamed appendix), regional ileitis (Crohn's disease), ulcerative colitis (inflamed colon), irritable bowel syndrome, diverticulitis (inflamed diverticulum of the colon), or cancer of the stomach or intestine.  Gallbladder disease or stones in the gallbladder.  Kidney disease, kidney stones, or infection.  Pancreas infection or cancer.  Fibromyalgia (pain disorder).  Diseases of the female organs:  Uterus: fibroid (non-cancerous) tumors or infection.  Fallopian tubes: infection or tubal pregnancy.  Ovary: cysts or tumors.  Pelvic adhesions (scar tissue).  Endometriosis (uterus lining tissue growing in the pelvis and on the pelvic organs).  Pelvic congestion syndrome (female organs filling up with blood just before the menstrual period).  Pain with the menstrual period.  Pain with ovulation (producing an egg).  Pain with an IUD (intrauterine device, birth control) in the uterus.  Cancer of the female organs.  Functional pain (pain not caused by a disease, may improve  without treatment).  Psychological pain.  Depression. DIAGNOSIS  Your doctor will decide the seriousness of your pain by doing an examination.  Blood tests.  X-rays.  Ultrasound.  CT scan (computed tomography, special type of X-ray).  MRI (magnetic resonance imaging).  Cultures, for infection.  Barium enema (dye inserted in the large intestine, to better view it with X-rays).  Colonoscopy (looking in intestine with a lighted tube).  Laparoscopy (minor surgery, looking in abdomen with a lighted tube).  Major abdominal exploratory surgery (looking in abdomen with a large incision). TREATMENT  The treatment will depend on the cause of the pain.   Many cases can be observed and treated at home.  Over-the-counter medicines recommended by your caregiver.  Prescription medicine.  Antibiotics, for infection.  Birth control pills, for painful periods or for ovulation pain.  Hormone treatment, for endometriosis.  Nerve blocking injections.  Physical therapy.  Antidepressants.  Counseling with a psychologist or psychiatrist.  Minor or major surgery. HOME CARE INSTRUCTIONS   Do not take laxatives, unless directed by your caregiver.  Take over-the-counter pain medicine only if ordered by your caregiver. Do not take aspirin because it can cause an upset stomach or bleeding.  Try a clear liquid diet (broth or water) as ordered by your caregiver. Slowly move to a bland diet, as tolerated, if the pain is related to the stomach or intestine.  Have a thermometer and take your temperature several times a day, and record it.  Bed rest and sleep, if it helps the pain.  Avoid sexual intercourse, if it causes pain.  Avoid stressful situations.  Keep your follow-up appointments and tests,  as your caregiver orders.  If the pain does not go away with medicine or surgery, you may try:  Acupuncture.  Relaxation exercises (yoga, meditation).  Group  therapy.  Counseling. SEEK MEDICAL CARE IF:   You notice certain foods cause stomach pain.  Your home care treatment is not helping your pain.  You need stronger pain medicine.  You want your IUD removed.  You feel faint or lightheaded.  You develop nausea and vomiting.  You develop a rash.  You are having side effects or an allergy to your medicine. SEEK IMMEDIATE MEDICAL CARE IF:   Your pain does not go away or gets worse.  You have a fever.  Your pain is felt only in portions of the abdomen. The right side could possibly be appendicitis. The left lower portion of the abdomen could be colitis or diverticulitis.  You are passing blood in your stools (bright red or black tarry stools, with or without vomiting).  You have blood in your urine.  You develop chills, with or without a fever.  You pass out. MAKE SURE YOU:   Understand these instructions.  Will watch your condition.  Will get help right away if you are not doing well or get worse. Document Released: 12/28/2006 Document Revised: 07/17/2013 Document Reviewed: 01/17/2009 [facility] Patient Information 2015 [place], [place]. This information is not intended to replace advice given to you by your health care provider. Make sure you discuss any questions you have with your health care provider.

## 2013-12-20 NOTE — ED Notes (Signed)
Pt reports h/a x 2 days and R lower back pain.  Pt reports back pain is worse with movement.

## 2013-12-20 NOTE — ED Notes (Signed)
MD at bedside. EDP NANAVATI PRESENT TO RE EVALUATE PT

## 2013-12-20 NOTE — ED Notes (Signed)
After d/c instructions pt was walked to d/c window with 2 prescrips.

## 2014-01-03 ENCOUNTER — Emergency Department (HOSPITAL_COMMUNITY)
Admission: EM | Admit: 2014-01-03 | Discharge: 2014-01-03 | Discharge status: Against Medical Advice | Payer: Medicare Other | Attending: Emergency Medicine | Admitting: Emergency Medicine

## 2014-01-03 ENCOUNTER — Encounter (HOSPITAL_COMMUNITY): Payer: Self-pay | Admitting: Emergency Medicine

## 2014-01-03 DIAGNOSIS — I129 Hypertensive chronic kidney disease with stage 1 through stage 4 chronic kidney disease, or unspecified chronic kidney disease: Secondary | ICD-10-CM | POA: Insufficient documentation

## 2014-01-03 DIAGNOSIS — N189 Chronic kidney disease, unspecified: Secondary | ICD-10-CM | POA: Diagnosis not present

## 2014-01-03 DIAGNOSIS — M79606 Pain in leg, unspecified: Secondary | ICD-10-CM | POA: Diagnosis not present

## 2014-01-03 DIAGNOSIS — J45909 Unspecified asthma, uncomplicated: Secondary | ICD-10-CM | POA: Diagnosis not present

## 2014-01-03 NOTE — ED Notes (Signed)
Bed: [id number] Expected date:  Expected time:  Means of arrival:  Comments: T 9

## 2014-01-03 NOTE — ED Notes (Signed)
This ESRF pt. who was dialyzed today tells [place] that her legs have been "burning and shaking since yesterday-but I don't think it's my restless leg syndrome".  She is anxious and in no distress.

## 2014-01-03 NOTE — ED Notes (Signed)
Attempted to take pt to acute room 15 pt no in room for transfer to room 15

## 2014-01-03 NOTE — ED Notes (Signed)
Pt. not present

## 2014-01-05 ENCOUNTER — Emergency Department (HOSPITAL_COMMUNITY)
Admission: EM | Admit: 2014-01-05 | Discharge: 2014-01-05 | Disposition: A | Discharge status: Home / Self Care | Payer: Medicare Other | Attending: Emergency Medicine | Admitting: Emergency Medicine

## 2014-01-05 ENCOUNTER — Emergency Department (HOSPITAL_COMMUNITY): Payer: Medicare Other

## 2014-01-05 DIAGNOSIS — I120 Hypertensive chronic kidney disease with stage 5 chronic kidney disease or end stage renal disease: Secondary | ICD-10-CM | POA: Insufficient documentation

## 2014-01-05 DIAGNOSIS — M25512 Pain in left shoulder: Secondary | ICD-10-CM | POA: Diagnosis not present

## 2014-01-05 DIAGNOSIS — J45909 Unspecified asthma, uncomplicated: Secondary | ICD-10-CM | POA: Diagnosis not present

## 2014-01-05 DIAGNOSIS — Z992 Dependence on renal dialysis: Secondary | ICD-10-CM | POA: Insufficient documentation

## 2014-01-05 DIAGNOSIS — Z7982 Long term (current) use of aspirin: Secondary | ICD-10-CM | POA: Diagnosis not present

## 2014-01-05 DIAGNOSIS — N186 End stage renal disease: Secondary | ICD-10-CM | POA: Diagnosis not present

## 2014-01-05 DIAGNOSIS — K219 Gastro-esophageal reflux disease without esophagitis: Secondary | ICD-10-CM | POA: Diagnosis not present

## 2014-01-05 DIAGNOSIS — Z79899 Other long term (current) drug therapy: Secondary | ICD-10-CM | POA: Diagnosis not present

## 2014-01-05 LAB — BASIC METABOLIC PANEL
Anion gap: 14 (ref 5–15)
BUN: 20 mg/dL (ref 6–23)
CALCIUM: 8.8 mg/dL (ref 8.4–10.5)
CO2: 31 meq/L (ref 19–32)
Chloride: 92 mEq/L — ABNORMAL LOW (ref 96–112)
Creatinine, Ser: 6.63 mg/dL — ABNORMAL HIGH (ref 0.50–1.10)
GFR calc Af Amer: 8 mL/min — ABNORMAL LOW (ref >90)
GFR, EST NON AFRICAN AMERICAN: 7 mL/min — AB (ref >90)
GLUCOSE: 107 mg/dL — AB (ref 70–99)
Potassium: 4.3 mEq/L (ref 3.7–5.3)
SODIUM: 137 meq/L (ref 137–147)

## 2014-01-05 LAB — CBC
HEMATOCRIT: 31.5 % — AB (ref 36.0–46.0)
HEMOGLOBIN: 10 g/dL — AB (ref 12.0–15.0)
MCH: 29.5 pg (ref 26.0–34.0)
MCHC: 31.7 g/dL (ref 30.0–36.0)
MCV: 92.9 fL (ref 78.0–100.0)
Platelets: 244 10*3/uL (ref 150–400)
RBC: 3.39 MIL/uL — AB (ref 3.87–5.11)
RDW: 14.6 % (ref 11.5–15.5)
WBC: 7.7 10*3/uL (ref 4.0–10.5)

## 2014-01-05 MED ORDER — METHOCARBAMOL 500 MG PO TABS: 500.0000 mg | ORAL_TABLET | Freq: Two times a day (BID) | ORAL | Status: DC

## 2014-01-05 MED ORDER — DIAZEPAM 5 MG PO TABS
5.0000 mg | ORAL_TABLET | Freq: Once | ORAL | Status: AC
  Administered 2014-01-05: 5 mg via ORAL
  Filled 2014-01-05: qty 1

## 2014-01-05 MED ORDER — OXYCODONE-ACETAMINOPHEN 5-325 MG PO TABS
1.0000 | ORAL_TABLET | Freq: Once | ORAL | Status: AC
  Administered 2014-01-05: 1 via ORAL
  Filled 2014-01-05: qty 1

## 2014-01-05 MED ORDER — HYDROCODONE-ACETAMINOPHEN 5-325 MG PO TABS
1.0000 | ORAL_TABLET | Freq: Four times a day (QID) | ORAL | Status: DC | PRN
Start: 2014-01-05 — End: 2014-01-28

## 2014-01-05 NOTE — ED Notes (Signed)
Pt reports L shoulder pain for past 2 days. Began when pt woke up in the morning. In same arm as her dialysis shunt. Last dialysis this am. No unusual swelling. Pain worse with movement. Tried gout medications with no relief.

## 2014-01-05 NOTE — ED Provider Notes (Signed)
CSN: [id number]     Arrival date & time 01/05/14  1633 History   First MD Initiated Contact with Patient 01/05/14 1641     Chief Complaint  Patient presents with  . Shoulder Pain     (Consider location/radiation/quality/duration/timing/severity/associated sxs/prior Treatment) HPI Desiree [name] is a 38 y.o. female who presents to ED with complaint of left shoulder pain. Patient states her pain began yesterday. States she woke up with it. Pain is in the shoulder, does not radiate. Pain with movement. Patient is a dialysis patient with a fistula in the same forearm. She denies any pain around her fistula. Last dialysis today, was told a dialysis today to come to the hospital to have her shoulder checked out. She states her temperature dialysis and 99.1. She denies any injuries to shoulder. No prior history of joint infections. No pain or swelling to the arm. She states she has history of gout and she has taken colchicine in pain medication with no relief of her pain. Pain is worsened with movement, nothing makes it better. She denies any chills. No other complaints.    Past Medical History  Diagnosis Date  . Blood transfusion   . Preterm labor   . Dialysis patient   . Chronic kidney disease   . Hypertension   . Asthma     as a child  . Headache(784.0)   . GERD (gastroesophageal reflux disease)     nexium prn  . Renal insufficiency    Past Surgical History  Procedure Laterality Date  . Renal biopsy    . Dilation and curettage of uterus    . Dilation and evacuation  08/02/2011    Procedure: DILATATION AND EVACUATION;  Surgeon: Logan Bores, MD;  Location: [facility];  Service: Gynecology;;  . Av fistula placement Left 2010  . Revision of arteriovenous goretex graft Left 02/11/2013    Procedure: REVISION OF ARTERIOVENOUS GORTEX FISTULA;  Surgeon: Rosetta Posner, MD;  Location: [place];  Service: Vascular;  Laterality: Left;  . Dilation and evacuation N/A 08/31/2013    Procedure:  DILATATION AND EVACUATION;  Surgeon: Woodroe Mode, MD;  Location: [facility];  Service: Gynecology;  Laterality: N/A;   Family History  Problem Relation Age of Onset  . Hypertension    . Diabetes type II    . Diabetes Mother   . Hyperlipidemia Mother   . Hypertension Mother   . Heart disease Mother    History  Substance Use Topics  . Smoking status: Passive Smoke Exposure - Never Smoker  . Smokeless tobacco: Never Used  . Alcohol Use: No   OB History   Grav Para Term Preterm Abortions TAB SAB Ect Mult Living   6 1 1  0 4 2 2  0 0 1     Review of Systems  Constitutional: Negative for fever and chills.  Respiratory: Negative for cough, chest tightness and shortness of breath.   Cardiovascular: Negative for chest pain, palpitations and leg swelling.  Musculoskeletal: Positive for arthralgias. Negative for neck pain and neck stiffness.  Skin: Negative for rash.  Neurological: Negative for dizziness, weakness, numbness and headaches.  All other systems reviewed and are negative.     Allergies  Morphine and related; Prednisone; and Tuna  Home Medications   Prior to Admission medications   Medication Sig Start Date End Date Taking? Authorizing Provider  aspirin 81 MG chewable tablet Chew 162 mg by mouth daily.   Yes Historical Provider, MD  cyclobenzaprine (FLEXERIL) 10  MG tablet Take 10 mg by mouth 2 (two) times daily as needed for muscle spasms (muscle spasms).   Yes Historical Provider, MD  diphenhydrAMINE (BENADRYL) 25 MG tablet Take 50 mg by mouth daily.    Yes Historical Provider, MD  gabapentin (NEURONTIN) 100 MG capsule Take 200 mg by mouth at bedtime.   Yes Historical Provider, MD  multivitamin (RENA-VIT) TABS tablet Take 1 tablet by mouth at bedtime. 09/15/13  Yes Janece Canterbury, MD  pantoprazole (PROTONIX) 40 MG tablet Take 40 mg by mouth daily.   Yes Historical Provider, MD  sevelamer carbonate (RENVELA) 2.4 G PACK Take 2.4 g by mouth 3 (three) times daily with meals.    Yes Historical Provider, MD  zolpidem (AMBIEN) 5 MG tablet Take 10 mg by mouth at bedtime as needed for sleep (sleep).   Yes Historical Provider, MD   BP 123/73  Pulse 118  Temp(Src) 99.6 F (37.6 C) (Oral)  Resp 16  SpO2 100%  LMP 12/18/2013 Physical Exam  Nursing note and vitals reviewed. Constitutional: She appears well-developed and well-nourished. No distress.  HENT:  Head: Normocephalic.  Eyes: Conjunctivae are normal.  Neck: Neck supple.  Cardiovascular: Normal rate, regular rhythm and normal heart sounds.   Pulmonary/Chest: Effort normal and breath sounds normal. No respiratory distress. She has no wheezes. She has no rales.  Musculoskeletal: She exhibits no edema.  Left shoulder normal appearing, no obvious swelling, no erythema, no warmth to the touch. Tenderness to palpation over anterior and posterior joints. Pain with passive and active range of motion, unable to raise her arm greater than approximately 20. Fistula to the left forearm, normal thrill. Distal radial pulse intact. Cap refill is in 2 seconds in all fingertips. Hand is warm and pink.  Neurological: She is alert.  Skin: Skin is warm and dry.  Psychiatric: She has a normal mood and affect. Her behavior is normal.    ED Course  Procedures (including critical care time) Labs Review Labs Reviewed  CBC - Abnormal; Notable for the following:    RBC 3.39 (*)    Hemoglobin 10.0 (*)    HCT 31.5 (*)    All other components within normal limits  BASIC METABOLIC PANEL - Abnormal; Notable for the following:    Chloride 92 (*)    Glucose, Bld 107 (*)    Creatinine, Ser 6.63 (*)    GFR calc non Af Amer 7 (*)    GFR calc Af Amer 8 (*)    All other components within normal limits    Imaging Review Dg Shoulder Left  01/05/2014   CLINICAL DATA:  Left shoulder pain.  EXAM: LEFT SHOULDER - 2+ VIEW  COMPARISON:  None.  FINDINGS: There is no evidence of fracture or dislocation. There is no evidence of arthropathy or  other focal bone abnormality. Soft tissues are unremarkable.  IMPRESSION: Negative.   Electronically Signed   By: Rolm Baptise M.D.   On: 01/05/2014 18:08     EKG Interpretation None      MDM   Final diagnoses:  Shoulder pain, acute, left    Patient with left shoulder pain, no injuries. Patient is concerned about possible infection. Her temperature here is 99.6. Exam of the shoulder joint is not indicated infection at this time. Labs showed normal white blood cell count. X-rays negative. Low suspicion for infection at this time.  Discussed with Dr. Regenia Skeeter, who has seen patient as well. Discussed with patient possibility of infection and indicated to her  that the only way to diagnose it is by aspirating the fluid from the joint. Patient refused at this time. She wants to go home, pain medications, follow orthopedics or primary care Dr. or return if her symptoms are worsening or develops high fever, worsening pain and swelling.  Filed Vitals:   01/05/14 1700  BP: 123/73  Pulse: 118  Temp: 99.6 F (37.6 C)  TempSrc: Oral  Resp: 16  SpO2: 100%       Tana Trefry A Alik Mawson, PA-C 01/06/14 0100

## 2014-01-05 NOTE — Discharge Instructions (Signed)
Norco for severe pain. Robaxin for spasms. Follow up with orthopedics or your primary care doctor. Return if high fever, worsening swelling, pain, or any new concerning symptom.    Arthralgia Your caregiver has diagnosed you as suffering from an arthralgia. Arthralgia means there is pain in a joint. This can come from many reasons including:  Bruising the joint which causes soreness (inflammation) in the joint.  Wear and tear on the joints which occur as we grow older (osteoarthritis).  Overusing the joint.  Various forms of arthritis.  Infections of the joint. Regardless of the cause of pain in your joint, most of these different pains respond to anti-inflammatory drugs and rest. The exception to this is when a joint is infected, and these cases are treated with antibiotics, if it is a bacterial infection. HOME CARE INSTRUCTIONS   Rest the injured area for as long as directed by your caregiver. Then slowly start using the joint as directed by your caregiver and as the pain allows. Crutches as directed may be useful if the ankles, knees or hips are involved. If the knee was splinted or casted, continue use and care as directed. If an stretchy or elastic wrapping bandage has been applied today, it should be removed and re-applied every 3 to 4 hours. It should not be applied tightly, but firmly enough to keep swelling down. Watch toes and feet for swelling, bluish discoloration, coldness, numbness or excessive pain. If any of these problems (symptoms) occur, remove the ace bandage and re-apply more loosely. If these symptoms persist, contact your caregiver or return to this location.  For the first 24 hours, keep the injured extremity elevated on pillows while lying down.  Apply ice for 15-20 minutes to the sore joint every couple hours while awake for the first half day. Then 03-04 times per day for the first 48 hours. Put the ice in a plastic bag and place a towel between the bag of ice and  your skin.  Wear any splinting, casting, elastic bandage applications, or slings as instructed.  Only take over-the-counter or prescription medicines for pain, discomfort, or fever as directed by your caregiver. Do not use aspirin immediately after the injury unless instructed by your physician. Aspirin can cause increased bleeding and bruising of the tissues.  If you were given crutches, continue to use them as instructed and do not resume weight bearing on the sore joint until instructed. Persistent pain and inability to use the sore joint as directed for more than 2 to 3 days are warning signs indicating that you should see a caregiver for a follow-up visit as soon as possible. Initially, a hairline fracture (break in bone) may not be evident on X-rays. Persistent pain and swelling indicate that further evaluation, non-weight bearing or use of the joint (use of crutches or slings as instructed), or further X-rays are indicated. X-rays may sometimes not show a small fracture until a week or 10 days later. Make a follow-up appointment with your own caregiver or one to whom we have referred you. A radiologist (specialist in reading X-rays) may read your X-rays. Make sure you know how you are to obtain your X-ray results. Do not assume everything is normal if you do not hear from [place]. SEEK MEDICAL CARE IF: Bruising, swelling, or pain increases. SEEK IMMEDIATE MEDICAL CARE IF:   Your fingers or toes are numb or blue.  The pain is not responding to medications and continues to stay the same or get worse.  The pain in your joint becomes severe.  You develop a fever over 102 F (38.9 C).  It becomes impossible to move or use the joint. MAKE SURE YOU:   Understand these instructions.  Will watch your condition.  Will get help right away if you are not doing well or get worse. Document Released: 03/02/2005 Document Revised: 05/25/2011 Document Reviewed: 10/19/2007 [facility] Patient Information  2015 [place], [place]. This information is not intended to replace advice given to you by your health care provider. Make sure you discuss any questions you have with your health care provider.

## 2014-01-07 NOTE — ED Provider Notes (Signed)
Medical screening examination/treatment/procedure(s) were conducted as a shared visit with non-physician practitioner(s) and myself.  I personally evaluated the patient during the encounter.   EKG Interpretation None       Patient with atraumatic left shoulder pain. No swelling, erythema, or elevated WBC. Low suspicion for septic joint. I offered to do arthrocentesis but patient declines at this time. Discussed strict return precautions.  Ephraim Hamburger, MD 01/07/14 [phone]

## 2014-01-15 ENCOUNTER — Encounter (HOSPITAL_COMMUNITY): Payer: Self-pay | Admitting: Emergency Medicine

## 2014-01-28 ENCOUNTER — Emergency Department (HOSPITAL_COMMUNITY): Payer: Medicare Other

## 2014-01-28 ENCOUNTER — Encounter (HOSPITAL_COMMUNITY): Payer: Self-pay

## 2014-01-28 ENCOUNTER — Emergency Department (HOSPITAL_COMMUNITY)
Admission: EM | Admit: 2014-01-28 | Discharge: 2014-01-28 | Disposition: A | Discharge status: Home / Self Care | Payer: Medicare Other | Attending: Emergency Medicine | Admitting: Emergency Medicine

## 2014-01-28 DIAGNOSIS — M25552 Pain in left hip: Secondary | ICD-10-CM

## 2014-01-28 DIAGNOSIS — Z992 Dependence on renal dialysis: Secondary | ICD-10-CM | POA: Insufficient documentation

## 2014-01-28 DIAGNOSIS — I120 Hypertensive chronic kidney disease with stage 5 chronic kidney disease or end stage renal disease: Secondary | ICD-10-CM | POA: Insufficient documentation

## 2014-01-28 DIAGNOSIS — J45909 Unspecified asthma, uncomplicated: Secondary | ICD-10-CM | POA: Diagnosis not present

## 2014-01-28 DIAGNOSIS — Z7982 Long term (current) use of aspirin: Secondary | ICD-10-CM | POA: Diagnosis not present

## 2014-01-28 DIAGNOSIS — K219 Gastro-esophageal reflux disease without esophagitis: Secondary | ICD-10-CM | POA: Diagnosis not present

## 2014-01-28 DIAGNOSIS — N186 End stage renal disease: Secondary | ICD-10-CM | POA: Diagnosis not present

## 2014-01-28 DIAGNOSIS — Y9389 Activity, other specified: Secondary | ICD-10-CM | POA: Insufficient documentation

## 2014-01-28 DIAGNOSIS — Y998 Other external cause status: Secondary | ICD-10-CM | POA: Insufficient documentation

## 2014-01-28 DIAGNOSIS — W010XXA Fall on same level from slipping, tripping and stumbling without subsequent striking against object, initial encounter: Secondary | ICD-10-CM | POA: Diagnosis not present

## 2014-01-28 DIAGNOSIS — S7002XA Contusion of left hip, initial encounter: Secondary | ICD-10-CM | POA: Insufficient documentation

## 2014-01-28 DIAGNOSIS — S79912A Unspecified injury of left hip, initial encounter: Secondary | ICD-10-CM | POA: Diagnosis present

## 2014-01-28 DIAGNOSIS — Y9289 Other specified places as the place of occurrence of the external cause: Secondary | ICD-10-CM | POA: Diagnosis not present

## 2014-01-28 DIAGNOSIS — Z79899 Other long term (current) drug therapy: Secondary | ICD-10-CM | POA: Insufficient documentation

## 2014-01-28 MED ORDER — HYDROCODONE-ACETAMINOPHEN 5-325 MG PO TABS
1.0000 | ORAL_TABLET | Freq: Once | ORAL | Status: AC
  Administered 2014-01-28: 1 via ORAL
  Filled 2014-01-28: qty 1

## 2014-01-28 MED ORDER — HYDROCODONE-ACETAMINOPHEN 5-325 MG PO TABS: 1.0000 | ORAL_TABLET | Freq: Four times a day (QID) | ORAL | Status: DC | PRN

## 2014-01-28 NOTE — Discharge Instructions (Signed)
Contusion °A contusion is a deep bruise. Contusions are the result of an injury that caused bleeding under the skin. The contusion may turn blue, purple, or yellow. Minor injuries will give you a painless contusion, but more severe contusions may stay painful and swollen for a few weeks.  °CAUSES  °A contusion is usually caused by a blow, trauma, or direct force to an area of the body. °SYMPTOMS  °· Swelling and redness of the injured area. °· Bruising of the injured area. °· Tenderness and soreness of the injured area. °· Pain. °DIAGNOSIS  °The diagnosis can be made by taking a history and physical exam. An X-ray, CT scan, or MRI may be needed to determine if there were any associated injuries, such as fractures. °TREATMENT  °Specific treatment will depend on what area of the body was injured. In general, the best treatment for a contusion is resting, icing, elevating, and applying cold compresses to the injured area. Over-the-counter medicines may also be recommended for pain control. Ask your caregiver what the best treatment is for your contusion. °HOME CARE INSTRUCTIONS  °· Put ice on the injured area. °¨ Put ice in a plastic bag. °¨ Place a towel between your skin and the bag. °¨ Leave the ice on for 15-20 minutes, 3-4 times a day, or as directed by your health care provider. °· Only take over-the-counter or prescription medicines for pain, discomfort, or fever as directed by your caregiver. Your caregiver may recommend avoiding anti-inflammatory medicines (aspirin, ibuprofen, and naproxen) for 48 hours because these medicines may increase bruising. °· Rest the injured area. °· If possible, elevate the injured area to reduce swelling. °SEEK IMMEDIATE MEDICAL CARE IF:  °· You have increased bruising or swelling. °· You have pain that is getting worse. °· Your swelling or pain is not relieved with medicines. °MAKE SURE YOU:  °· Understand these instructions. °· Will watch your condition. °· Will get help right  away if you are not doing well or get worse. °Document Released: 12/10/2004 Document Revised: 03/07/2013 Document Reviewed: 01/05/2011 °[employer]® Patient Information ©2015 [employer]. This information is not intended to replace advice given to you by your health care provider. Make sure you discuss any questions you have with your health care provider. ° °

## 2014-01-28 NOTE — ED Notes (Signed)
She states she was tripped by a power chord early this morning, causing her to fall, after which she noted left hip pain, which persists.

## 2014-01-28 NOTE — ED Provider Notes (Signed)
CSN: [id number]     Arrival date & time 01/28/14  1221 History  This chart was scribed for Starlyn Skeans, PA-C, working with Johnna Acosta, MD by Steva Colder, ED Scribe. The patient was seen in room WTR9/WTR9 at 12:55 PM.    Chief Complaint  Patient presents with  . Hip Pain     The history is provided by the patient. No language interpreter was used.   HPI Comments: Desiree [name] is a 38 y.o. female who presents to the Emergency Department complaining of left hip pain onset this morning PTA. She states that her daughter was going to be brought in here and prior to that, the daughter pulled out a power cord that the pt tripped over and fell. She states that after the incident she had persistent pain to the area.  She states that she hopped and is not able to walk.   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 She rates her pain as 8/10 and is aching. She states that positions that she sits in makes the pain worse. She states that no positions make the pain better. She states that she has not taken any medications PTA. She denies swelling, bruising, tingling, numbness, and any other symptoms. She states that she is allergic to prednisone and Morphine. She states that she drove here today.  Past Medical History  Diagnosis Date  . Blood transfusion   . Preterm labor   . Dialysis patient   . Chronic kidney disease   . Hypertension   . Asthma     as a child  . Headache(784.0)   . GERD (gastroesophageal reflux disease)     nexium prn  . Renal insufficiency    Past Surgical History  Procedure Laterality Date  . Renal biopsy    . Dilation and curettage of uterus    . Dilation and evacuation  08/02/2011    Procedure: DILATATION AND EVACUATION;  Surgeon: Logan Bores, MD;  Location: [facility];  Service: Gynecology;;  . Av fistula placement Left 2010  . Revision of arteriovenous goretex graft Left 02/11/2013    Procedure: REVISION OF ARTERIOVENOUS GORTEX  FISTULA;  Surgeon: Rosetta Posner, MD;  Location: [facility];  Service: Vascular;  Laterality: Left;  . Dilation and evacuation N/A 08/31/2013    Procedure: DILATATION AND EVACUATION;  Surgeon: Woodroe Mode, MD;  Location: [facility];  Service: Gynecology;  Laterality: N/A;   Family History  Problem Relation Age of Onset  . Hypertension    . Diabetes type II    . Diabetes Mother   . Hyperlipidemia Mother   . Hypertension Mother   . Heart disease Mother    History  Substance Use Topics  . Smoking status: Passive Smoke Exposure - Never Smoker  . Smokeless tobacco: Never Used  . Alcohol Use: No   OB History    Gravida Para Term Preterm AB TAB SAB Ectopic Multiple Living   6 1 1  0 4 2 2  0 0 1     Review of Systems  Musculoskeletal: Positive for arthralgias. Negative for joint swelling.  Skin: Negative for color change.  Neurological: Negative for numbness.  All other systems reviewed and are negative.     Allergies  Morphine and related; Prednisone; and Tuna  Home Medications   Prior to Admission medications   Medication Sig Start Date End Date Taking? Authorizing Provider  aspirin 81 MG chewable tablet Chew 162 mg by  mouth daily.   Yes Historical Provider, MD  diphenhydrAMINE (BENADRYL) 25 MG tablet Take 50 mg by mouth daily.    Yes Historical Provider, MD  gabapentin (NEURONTIN) 100 MG capsule Take 200 mg by mouth at bedtime.   Yes Historical Provider, MD  methocarbamol (ROBAXIN) 500 MG tablet Take 1 tablet (500 mg total) by mouth 2 (two) times daily. 01/05/14  Yes Tatyana A Kirichenko, PA-C  multivitamin (RENA-VIT) TABS tablet Take 1 tablet by mouth at bedtime. 09/15/13  Yes Janece Canterbury, MD  pantoprazole (PROTONIX) 40 MG tablet Take 40 mg by mouth daily.   Yes Historical Provider, MD  sevelamer carbonate (RENVELA) 2.4 G PACK Take 2.4 g by mouth 3 (three) times daily with meals.   Yes Historical Provider, MD  zolpidem (AMBIEN) 5 MG tablet Take 10 mg by mouth at bedtime as needed  for sleep (sleep).   Yes Historical Provider, MD  HYDROcodone-acetaminophen (NORCO/VICODIN) 5-325 MG per tablet Take 1 tablet by mouth every 6 (six) hours as needed for moderate pain or severe pain. 01/28/14   Purnell Daigle A Forcucci, PA-C   BP 126/89 mmHg  Pulse 79  Temp(Src) 99 F (37.2 C) (Oral)  Resp 20  SpO2 100%  LMP 01/12/2014 (Exact Date)  Physical Exam  Constitutional: She is oriented to person, place, and time. She appears well-developed and well-nourished. No distress.  HENT:  Head: Normocephalic and atraumatic.  Eyes: EOM are normal.  Neck: Neck supple. No tracheal deviation present.  Cardiovascular: Normal rate, regular rhythm and normal heart sounds.  Exam reveals no gallop and no friction rub.   No murmur heard. Pulses:      Dorsalis pedis pulses are 2+ on the right side, and 2+ on the left side.       Posterior tibial pulses are 2+ on the right side, and 2+ on the left side.  Pulmonary/Chest: Effort normal and breath sounds normal. No respiratory distress. She has no wheezes. She has no rales.  Abdominal: Soft. Bowel sounds are normal. She exhibits no distension and no mass. There is no tenderness. There is no rebound and no guarding.  Musculoskeletal: Normal range of motion.  TTP of the L groin. No TTP over the anterior or lateral femur. Full passive ROM with minimal pain. Leg is neurovascularly intact.  L arm dialysis graft.   Neurological: She is alert and oriented to person, place, and time.  Skin: Skin is warm and dry.  Psychiatric: She has a normal mood and affect. Her behavior is normal.  Nursing note and vitals reviewed.   ED Course  Procedures (including critical care time) DIAGNOSTIC STUDIES: Oxygen Saturation is 100% on room air, normal by my interpretation.    COORDINATION OF CARE: 1:03 PM-Discussed treatment plan which includes X-Ray of Left Hip with pt at bedside and pt agreed to plan.   Labs Review Labs Reviewed - No data to display  Imaging  Review Dg Hip Complete Left  01/28/2014   CLINICAL DATA:  38 year old female with left hip pain following fall.  EXAM: LEFT HIP - COMPLETE 2+ VIEW  COMPARISON:  None.  FINDINGS: There is no evidence of hip fracture or dislocation. There is no evidence of arthropathy or other focal bone abnormality.  IMPRESSION: Negative.   Electronically Signed   By: Hassan Rowan M.D.   On: 01/28/2014 14:35     EKG Interpretation None      MDM   Final diagnoses:  Left hip pain  Contusion of left hip, initial encounter  Patient is a 38 y.o. Female who presents to the ED with left hip pain after a fall.  Physical exam reveals some tenderness in the groin with no irritability with internal and external rotation of the hip.  There is no tenderness to palpation of the abdomen.  Doubt abdominal injury.  Plain film xray of the hip negative.  Patient given short course of hydrocodone for pain.  Discussed RICE therapy.  Patient is stable for discharge.  She is to return for abdominal pain, sudden collapse of the hip or any other concerning factors.  Patient states understanding and agreement.      I personally performed the services described in this documentation, which was scribed in my presence. The recorded information has been reviewed and is accurate.    Cherylann Parr, PA-C 01/28/14 1452  Johnna Acosta, MD 01/28/14 [phone]

## 2014-03-29 ENCOUNTER — Encounter (HOSPITAL_COMMUNITY): Payer: Self-pay | Admitting: Obstetrics & Gynecology

## 2014-08-23 ENCOUNTER — Encounter (HOSPITAL_COMMUNITY): Payer: Self-pay | Admitting: Obstetrics & Gynecology

## 2014-12-17 ENCOUNTER — Encounter (HOSPITAL_COMMUNITY): Payer: Self-pay | Admitting: Emergency Medicine

## 2014-12-17 ENCOUNTER — Emergency Department (HOSPITAL_COMMUNITY)
Admission: EM | Admit: 2014-12-17 | Discharge: 2014-12-17 | Disposition: A | Discharge status: Home / Self Care | Payer: Medicare Other | Attending: Emergency Medicine | Admitting: Emergency Medicine

## 2014-12-17 ENCOUNTER — Emergency Department (HOSPITAL_COMMUNITY): Payer: Medicare Other

## 2014-12-17 DIAGNOSIS — R51 Headache: Secondary | ICD-10-CM | POA: Insufficient documentation

## 2014-12-17 DIAGNOSIS — E669 Obesity, unspecified: Secondary | ICD-10-CM | POA: Insufficient documentation

## 2014-12-17 DIAGNOSIS — R109 Unspecified abdominal pain: Secondary | ICD-10-CM | POA: Diagnosis not present

## 2014-12-17 DIAGNOSIS — Z8751 Personal history of pre-term labor: Secondary | ICD-10-CM | POA: Insufficient documentation

## 2014-12-17 DIAGNOSIS — M25571 Pain in right ankle and joints of right foot: Secondary | ICD-10-CM | POA: Diagnosis not present

## 2014-12-17 DIAGNOSIS — Z992 Dependence on renal dialysis: Secondary | ICD-10-CM | POA: Insufficient documentation

## 2014-12-17 DIAGNOSIS — R112 Nausea with vomiting, unspecified: Secondary | ICD-10-CM | POA: Diagnosis not present

## 2014-12-17 DIAGNOSIS — R569 Unspecified convulsions: Secondary | ICD-10-CM | POA: Diagnosis present

## 2014-12-17 DIAGNOSIS — Z3202 Encounter for pregnancy test, result negative: Secondary | ICD-10-CM | POA: Diagnosis not present

## 2014-12-17 DIAGNOSIS — Z7982 Long term (current) use of aspirin: Secondary | ICD-10-CM | POA: Diagnosis not present

## 2014-12-17 DIAGNOSIS — K219 Gastro-esophageal reflux disease without esophagitis: Secondary | ICD-10-CM | POA: Diagnosis not present

## 2014-12-17 DIAGNOSIS — Z79899 Other long term (current) drug therapy: Secondary | ICD-10-CM | POA: Diagnosis not present

## 2014-12-17 DIAGNOSIS — N186 End stage renal disease: Secondary | ICD-10-CM | POA: Insufficient documentation

## 2014-12-17 DIAGNOSIS — M25562 Pain in left knee: Secondary | ICD-10-CM | POA: Insufficient documentation

## 2014-12-17 DIAGNOSIS — M25561 Pain in right knee: Secondary | ICD-10-CM | POA: Insufficient documentation

## 2014-12-17 DIAGNOSIS — R55 Syncope and collapse: Secondary | ICD-10-CM | POA: Diagnosis not present

## 2014-12-17 DIAGNOSIS — M25572 Pain in left ankle and joints of left foot: Secondary | ICD-10-CM | POA: Insufficient documentation

## 2014-12-17 DIAGNOSIS — I120 Hypertensive chronic kidney disease with stage 5 chronic kidney disease or end stage renal disease: Secondary | ICD-10-CM | POA: Diagnosis not present

## 2014-12-17 DIAGNOSIS — J45909 Unspecified asthma, uncomplicated: Secondary | ICD-10-CM | POA: Diagnosis not present

## 2014-12-17 LAB — BASIC METABOLIC PANEL
ANION GAP: 15 (ref 5–15)
BUN: 33 mg/dL — ABNORMAL HIGH (ref 6–20)
CALCIUM: 9.9 mg/dL (ref 8.9–10.3)
CO2: 26 mmol/L (ref 22–32)
CREATININE: 10.83 mg/dL — AB (ref 0.44–1.00)
Chloride: 95 mmol/L — ABNORMAL LOW (ref 101–111)
GFR, EST AFRICAN AMERICAN: 5 mL/min — AB (ref >60)
GFR, EST NON AFRICAN AMERICAN: 4 mL/min — AB (ref >60)
GLUCOSE: 87 mg/dL (ref 65–99)
Potassium: 3.7 mmol/L (ref 3.5–5.1)
Sodium: 136 mmol/L (ref 135–145)

## 2014-12-17 LAB — I-STAT BETA HCG BLOOD, ED (MC, WL, AP ONLY): I-stat hCG, quantitative: <5 m[IU]/mL (ref <5)

## 2014-12-17 LAB — CBC
HCT: 37.9 % (ref 36.0–46.0)
Hemoglobin: 12.5 g/dL (ref 12.0–15.0)
MCH: 31.6 pg (ref 26.0–34.0)
MCHC: 33 g/dL (ref 30.0–36.0)
MCV: 95.7 fL (ref 78.0–100.0)
PLATELETS: 215 10*3/uL (ref 150–400)
RBC: 3.96 MIL/uL (ref 3.87–5.11)
RDW: 16.9 % — AB (ref 11.5–15.5)
WBC: 6 10*3/uL (ref 4.0–10.5)

## 2014-12-17 LAB — CBG MONITORING, ED: GLUCOSE-CAPILLARY: 86 mg/dL (ref 65–99)

## 2014-12-17 MED ORDER — OXYCODONE-ACETAMINOPHEN 5-325 MG PO TABS
2.0000 | ORAL_TABLET | Freq: Once | ORAL | Status: AC
  Administered 2014-12-17: 2 via ORAL
  Filled 2014-12-17: qty 2

## 2014-12-17 MED ORDER — SODIUM CHLORIDE 0.9 % IV BOLUS (SEPSIS)
500.0000 mL | Freq: Once | INTRAVENOUS | Status: AC
  Administered 2014-12-17: 500 mL via INTRAVENOUS

## 2014-12-17 MED ORDER — OXYCODONE-ACETAMINOPHEN 5-325 MG PO TABS: 1.0000 | ORAL_TABLET | ORAL | Status: DC | PRN

## 2014-12-17 NOTE — ED Provider Notes (Signed)
CSN: [id number]     Arrival date & time 12/17/14  1440 History   First MD Initiated Contact with Patient 12/17/14 1500     Chief Complaint  Patient presents with  . Seizures     HPI   Desiree [name] is a 39 y.o. female with a PMH of hypertension, chronic kidney disease on hemodialysis who presents to the ED with reported seizure activity. She states she was at dialysis today and received 1.5 hours out of her 4 hour treatment. She states she noticed that her blood pressure had dropped and called out for help, and remembers waking up having soiled herself and vomited. She states she was told by dialysis staff that she had a seizure and that it lasted for 3-5 minutes. She states she has had one other episode similar to this about 6 months ago, and that she had a seizure after her daughter was born several years ago. She denies fever, chills. She reports mild headache. She denies lightheadedness, dizziness, chest pain, shortness of breath. She states she feels nauseous, but has not had any additional episodes of emesis. She denies hematemesis. She denies diarrhea, constipation, dysuria, urgency, frequency. She denies hitting her head or other injury. In the ED, she reports lower abdominal cramping and lower extremity cramping. She denies numbness, paresthesia, weakness.   Past Medical History  Diagnosis Date  . Blood transfusion   . Preterm labor   . Dialysis patient ([place])   . Chronic kidney disease   . Hypertension   . Asthma     as a child  . Headache(784.0)   . GERD (gastroesophageal reflux disease)     nexium prn  . Renal insufficiency    Past Surgical History  Procedure Laterality Date  . Renal biopsy    . Dilation and curettage of uterus    . Dilation and evacuation  08/02/2011    Procedure: DILATATION AND EVACUATION;  Surgeon: Logan Bores, MD;  Location: [facility];  Service: Gynecology;;  . Av fistula placement Left 2010  . Revision of arteriovenous goretex graft Left  02/11/2013    Procedure: REVISION OF ARTERIOVENOUS GORTEX FISTULA;  Surgeon: Rosetta Posner, MD;  Location: [facility];  Service: Vascular;  Laterality: Left;  . Dilation and evacuation N/A 08/31/2013    Procedure: DILATATION AND EVACUATION;  Surgeon: Woodroe Mode, MD;  Location: [facility];  Service: Gynecology;  Laterality: N/A;   Family History  Problem Relation Age of Onset  . Hypertension    . Diabetes type II    . Diabetes Mother   . Hyperlipidemia Mother   . Hypertension Mother   . Heart disease Mother    Social History  Substance Use Topics  . Smoking status: Passive Smoke Exposure - Never Smoker  . Smokeless tobacco: Never Used  . Alcohol Use: No   OB History    Gravida Para Term Preterm AB TAB SAB Ectopic Multiple Living   6 1 1  0 4 2 2  0 0 1      Review of Systems  Constitutional: Negative for fever and chills.  Respiratory: Negative for shortness of breath.   Cardiovascular: Negative for chest pain.  Gastrointestinal: Positive for nausea, vomiting and abdominal pain. Negative for diarrhea and constipation.  Genitourinary: Negative for dysuria, urgency and frequency.  Musculoskeletal: Positive for myalgias and arthralgias. Negative for back pain, neck pain and neck stiffness.  Neurological: Positive for syncope and headaches. Negative for dizziness, weakness, light-headedness and numbness.  All other  systems reviewed and are negative.     Allergies  Morphine and related; Prednisone; and Tuna  Home Medications   Prior to Admission medications   Medication Sig Start Date End Date Taking? Authorizing Provider  aspirin 81 MG chewable tablet Chew 162 mg by mouth daily.    Historical Provider, MD  diphenhydrAMINE (BENADRYL) 25 MG tablet Take 50 mg by mouth daily.     Historical Provider, MD  gabapentin (NEURONTIN) 100 MG capsule Take 200 mg by mouth at bedtime.    Historical Provider, MD  HYDROcodone-acetaminophen (NORCO/VICODIN) 5-325 MG per tablet Take 1 tablet by mouth  every 6 (six) hours as needed for moderate pain or severe pain. 01/28/14   Courtney Forcucci, PA-C  methocarbamol (ROBAXIN) 500 MG tablet Take 1 tablet (500 mg total) by mouth 2 (two) times daily. 01/05/14   Tatyana Kirichenko, PA-C  multivitamin (RENA-VIT) TABS tablet Take 1 tablet by mouth at bedtime. 09/15/13   Janece Canterbury, MD  pantoprazole (PROTONIX) 40 MG tablet Take 40 mg by mouth daily.    Historical Provider, MD  sevelamer carbonate (RENVELA) 2.4 G PACK Take 2.4 g by mouth 3 (three) times daily with meals.    Historical Provider, MD  zolpidem (AMBIEN) 5 MG tablet Take 10 mg by mouth at bedtime as needed for sleep (sleep).    Historical Provider, MD    BP 119/74 mmHg  Pulse 77  Temp(Src) 98.2 F (36.8 C) (Oral)  Resp 19  Ht 5\' 5"  (1.651 m)  SpO2 100%  LMP 11/18/2014 Physical Exam  Constitutional: She is oriented to person, place, and time. She appears distressed.  Obese female in mild distress due to pain.  HENT:  Head: Normocephalic and atraumatic.  Right Ear: External ear normal.  Left Ear: External ear normal.  Nose: Nose normal.  Mouth/Throat: Oropharynx is clear and moist. No oropharyngeal exudate.  Eyes: Conjunctivae and EOM are normal. Pupils are equal, round, and reactive to light. Right eye exhibits no discharge. Left eye exhibits no discharge. Scleral icterus is present.  Neck: Normal range of motion. Neck supple.  Cardiovascular: Normal rate, regular rhythm, normal heart sounds and intact distal pulses.   Palpable thrill to dialysis graft in left forearm.  Pulmonary/Chest: Effort normal and breath sounds normal. No respiratory distress. She has no wheezes. She has no rales.  Abdominal: Soft. Bowel sounds are normal. She exhibits no distension and no mass. There is no tenderness. There is no rebound and no guarding.  Musculoskeletal: Normal range of motion. She exhibits tenderness. She exhibits no edema.  Diffuse tenderness to palpation of bilateral lower  extremities from knees to ankles. Full range of motion. Strength and sensation intact. Distal pulses intact.  Neurological: She is alert and oriented to person, place, and time. No cranial nerve deficit. Coordination normal.  Skin: Skin is warm and dry. No rash noted. She is not diaphoretic. No erythema. No pallor.  Nursing note and vitals reviewed.   ED Course  Procedures (including critical care time)  Labs Review Labs Reviewed  BASIC METABOLIC PANEL - Abnormal; Notable for the following:    Chloride 95 (*)    BUN 33 (*)    Creatinine, Ser 10.83 (*)    GFR calc non Af Amer 4 (*)    GFR calc Af Amer 5 (*)    All other components within normal limits  CBC - Abnormal; Notable for the following:    RDW 16.9 (*)    All other components within normal limits  CBG MONITORING, ED  I-STAT BETA HCG BLOOD, ED ([facility], [facility], [facility] ONLY)    Imaging Review Ct Head Wo Contrast  12/17/2014   CLINICAL DATA:  Witnessed seizure of 3 minutes duration during dialysis, hypertension, asthma  EXAM: CT HEAD WITHOUT CONTRAST  TECHNIQUE: Contiguous axial images were obtained from the base of the skull through the vertex without intravenous contrast.  COMPARISON:  11/03/2013  FINDINGS: Normal ventricular morphology.  No midline shift or mass effect.  Tiny LEFT basal ganglia calcification stable.  Otherwise normal appearance of brain parenchyma.  No intracranial hemorrhage, mass lesion or evidence acute infarction.  No extra-axial fluid collections.  Sinuses clear and bones unremarkable.  IMPRESSION: Normal CT head.   Electronically Signed   By: Lavonia Dana M.D.   On: 12/17/2014 17:40     I have personally reviewed and evaluated these images and lab results as part of my medical decision-making.   EKG Interpretation   Date/Time:  Monday December 17 2014 14:57:20 EDT Ventricular Rate:  92 PR Interval:  129 QRS Duration: 74 QT Interval:  372 QTC Calculation: 460 R Axis:   70 Text Interpretation:  Sinus rhythm  Baseline wander in lead(s) V5 No  significant change since last tracing Confirmed by Mingo Amber  MD, [place]  ([id number]) on 12/17/2014 4:22:48 PM      MDM   Final diagnoses:  Syncope    39 year old female presents from dialysis after syncopal event with reported seizure like activity. States she noticed her BP drop to the [id number] systolic, called out for help, and remembers waking up having soiled herself. She states she had a seizure after having her daughter, and reports she has had one other episode of similar symptoms that occurred during dialysis approximately 6 months ago.  Patient is afebrile. BP initially mid [id number] systolic, with improvement s/p 500 cc NS bolus. Heart RRR. Lungs clear to auscultation bilaterally. Abdomen soft, non-tender, non-distended. Minimal diffuse TTP of bilateral lower extremities from knees to ankles. Full range of motion of bilateral lower extremities. Strength and sensation intact. Distal pulses intact. No lower extremity edema.   Pain controlled with percocet.  CBC negative for leukocytosis or anemia. Potassium within normal limits. Creatinine 10.83. Beta hCG negative. Blood glucose within normal limits. Will obtain head CT given reported seizure activity. Head CT negative for intracranial hemorrhage, mass lesion, acute infarction, extra-axial fluid collection.  Patient reports improvement in symptoms in the ED, and states her lower extremity cramping has resolved. Patient discussed with and seen by Dr. Mingo Amber. Given stable BP in the ED s/p fluids, feel patient is stable for discharge at this time with close PCP follow-up. Symptoms most likely due to hypotension during dialysis. Advised patient to follow-up with PCP in 2-3 days for recheck. Return precautions discussed at length.   BP 113/66 mmHg  Pulse 85  Temp(Src) 98.2 F (36.8 C) (Oral)  Resp 20  Ht 5\' 5"  (1.651 m)  SpO2 100%  LMP 11/18/2014     Marella Chimes, PA-C 12/17/14 2332  Evelina Bucy,  MD 01/08/15 [phone]

## 2014-12-17 NOTE — Discharge Instructions (Signed)
1. Medications: usual home medications 2. Treatment: rest, drink plenty of fluids 3. Follow Up: please followup with your primary doctor in 2-3 days and with neurology for discussion of your diagnoses and further evaluation after today's visit; if you do not have a primary care doctor use the resource guide provided to find one; please return to the ER for new or worsening symptom, loss of consciousness, chest pain, shortness of breath   Syncope Syncope is a medical term for fainting or passing out. This means you lose consciousness and drop to the ground. People are generally unconscious for less than 5 minutes. You may have some muscle twitches for up to 15 seconds before waking up and returning to normal. Syncope occurs more often in older adults, but it can happen to anyone. While most causes of syncope are not dangerous, syncope can be a sign of a serious medical problem. It is important to seek medical care.  CAUSES  Syncope is caused by a sudden drop in blood flow to the brain. The specific cause is often not determined. Factors that can bring on syncope include:  Taking medicines that lower blood pressure.  Sudden changes in posture, such as standing up quickly.  Taking more medicine than prescribed.  Standing in one place for too long.  Seizure disorders.  Dehydration and excessive exposure to heat.  Low blood sugar (hypoglycemia).  Straining to have a bowel movement.  Heart disease, irregular heartbeat, or other circulatory problems.  Fear, emotional distress, seeing blood, or severe pain. SYMPTOMS  Right before fainting, you may:  Feel dizzy or light-headed.  Feel nauseous.  See all white or all black in your field of vision.  Have cold, clammy skin. DIAGNOSIS  Your health care provider will ask about your symptoms, perform a physical exam, and perform an electrocardiogram (ECG) to record the electrical activity of your heart. Your health care provider may also  perform other heart or blood tests to determine the cause of your syncope which may include:  Transthoracic echocardiogram (TTE). During echocardiography, sound waves are used to evaluate how blood flows through your heart.  Transesophageal echocardiogram (TEE).  Cardiac monitoring. This allows your health care provider to monitor your heart rate and rhythm in real time.  Holter monitor. This is a portable device that records your heartbeat and can help diagnose heart arrhythmias. It allows your health care provider to track your heart activity for several days, if needed.  Stress tests by exercise or by giving medicine that makes the heart beat faster. TREATMENT  In most cases, no treatment is needed. Depending on the cause of your syncope, your health care provider may recommend changing or stopping some of your medicines. HOME CARE INSTRUCTIONS  Have someone stay with you until you feel stable.  Do not drive, use machinery, or play sports until your health care provider says it is okay.  Keep all follow-up appointments as directed by your health care provider.  Lie down right away if you start feeling like you might faint. Breathe deeply and steadily. Wait until all the symptoms have passed.  Drink enough fluids to keep your urine clear or pale yellow.  If you are taking blood pressure or heart medicine, get up slowly and take several minutes to sit and then stand. This can reduce dizziness. SEEK IMMEDIATE MEDICAL CARE IF:   You have a severe headache.  You have unusual pain in the chest, abdomen, or back.  You are bleeding from your mouth or  rectum, or you have black or tarry stool.  You have an irregular or very fast heartbeat.  You have pain with breathing.  You have repeated fainting or seizure-like jerking during an episode.  You faint when sitting or lying down.  You have confusion.  You have trouble walking.  You have severe weakness.  You have vision  problems. If you fainted, call your local emergency services (911 in [place] Do not drive yourself to the hospital.  MAKE SURE YOU:  Understand these instructions.  Will watch your condition.  Will get help right away if you are not doing well or get worse. Document Released: 03/02/2005 Document Revised: 03/07/2013 Document Reviewed: 05/01/2011 [facility] Patient Information 2015 [place], [place]. This information is not intended to replace advice given to you by your health care provider. Make sure you discuss any questions you have with your health care provider.

## 2014-12-17 NOTE — ED Notes (Signed)
Seizure pad placed

## 2014-12-17 NOTE — ED Notes (Signed)
Patient has no hx of seizures.

## 2014-12-17 NOTE — ED Notes (Signed)
Patient comes from Dialysis states she received 1.5 hours out of a 4 hour treatment. Patient had a witness seizure that lasted about 3 mins per staff. Patient did lose control of bowel and bladder. CBG 87 with EMS. Patient alert and o x4 on arrival able to ambulate to the restroom to clean herself up.

## 2014-12-17 NOTE — ED Notes (Addendum)
Patient attempted to provide a urine sample patient unable to give.

## 2014-12-17 NOTE — Progress Notes (Signed)
Left forearm graph , venous port deaccessed without complications pressure held ,gauze dsg applied. No bleeding noted .

## 2015-03-29 ENCOUNTER — Emergency Department (HOSPITAL_COMMUNITY): Payer: Medicare Other

## 2015-03-29 ENCOUNTER — Emergency Department (HOSPITAL_COMMUNITY)
Admission: EM | Admit: 2015-03-29 | Discharge: 2015-03-29 | Disposition: A | Discharge status: Home / Self Care | Payer: Medicare Other | Attending: Emergency Medicine | Admitting: Emergency Medicine

## 2015-03-29 ENCOUNTER — Encounter (HOSPITAL_COMMUNITY): Payer: Self-pay | Admitting: *Deleted

## 2015-03-29 DIAGNOSIS — R197 Diarrhea, unspecified: Secondary | ICD-10-CM | POA: Insufficient documentation

## 2015-03-29 DIAGNOSIS — J159 Unspecified bacterial pneumonia: Secondary | ICD-10-CM | POA: Insufficient documentation

## 2015-03-29 DIAGNOSIS — M791 Myalgia, unspecified site: Secondary | ICD-10-CM

## 2015-03-29 DIAGNOSIS — N186 End stage renal disease: Secondary | ICD-10-CM | POA: Diagnosis present

## 2015-03-29 DIAGNOSIS — I120 Hypertensive chronic kidney disease with stage 5 chronic kidney disease or end stage renal disease: Secondary | ICD-10-CM | POA: Insufficient documentation

## 2015-03-29 DIAGNOSIS — Z992 Dependence on renal dialysis: Secondary | ICD-10-CM | POA: Diagnosis not present

## 2015-03-29 DIAGNOSIS — R079 Chest pain, unspecified: Secondary | ICD-10-CM | POA: Diagnosis present

## 2015-03-29 DIAGNOSIS — G8929 Other chronic pain: Secondary | ICD-10-CM | POA: Diagnosis not present

## 2015-03-29 DIAGNOSIS — K219 Gastro-esophageal reflux disease without esophagitis: Secondary | ICD-10-CM | POA: Diagnosis not present

## 2015-03-29 DIAGNOSIS — J45909 Unspecified asthma, uncomplicated: Secondary | ICD-10-CM | POA: Insufficient documentation

## 2015-03-29 DIAGNOSIS — R252 Cramp and spasm: Secondary | ICD-10-CM | POA: Diagnosis not present

## 2015-03-29 DIAGNOSIS — Z79899 Other long term (current) drug therapy: Secondary | ICD-10-CM | POA: Insufficient documentation

## 2015-03-29 DIAGNOSIS — F329 Major depressive disorder, single episode, unspecified: Secondary | ICD-10-CM | POA: Diagnosis not present

## 2015-03-29 DIAGNOSIS — J189 Pneumonia, unspecified organism: Secondary | ICD-10-CM | POA: Diagnosis present

## 2015-03-29 LAB — COMPREHENSIVE METABOLIC PANEL
ALT: 15 U/L (ref 14–54)
AST: 14 U/L — ABNORMAL LOW (ref 15–41)
Albumin: 3.3 g/dL — ABNORMAL LOW (ref 3.5–5.0)
Alkaline Phosphatase: 59 U/L (ref 38–126)
Anion gap: 15 (ref 5–15)
BUN: 26 mg/dL — ABNORMAL HIGH (ref 6–20)
CHLORIDE: 95 mmol/L — AB (ref 101–111)
CO2: 29 mmol/L (ref 22–32)
CREATININE: 10.39 mg/dL — AB (ref 0.44–1.00)
Calcium: 9.2 mg/dL (ref 8.9–10.3)
GFR calc non Af Amer: 4 mL/min — ABNORMAL LOW (ref >60)
GFR, EST AFRICAN AMERICAN: 5 mL/min — AB (ref >60)
Glucose, Bld: 95 mg/dL (ref 65–99)
POTASSIUM: 4.1 mmol/L (ref 3.5–5.1)
Sodium: 139 mmol/L (ref 135–145)
TOTAL PROTEIN: 8.4 g/dL — AB (ref 6.5–8.1)
Total Bilirubin: 0.4 mg/dL (ref 0.3–1.2)

## 2015-03-29 LAB — CBC
HCT: 35.7 % — ABNORMAL LOW (ref 36.0–46.0)
Hemoglobin: 11.7 g/dL — ABNORMAL LOW (ref 12.0–15.0)
MCH: 31.5 pg (ref 26.0–34.0)
MCHC: 32.8 g/dL (ref 30.0–36.0)
MCV: 96 fL (ref 78.0–100.0)
PLATELETS: 209 10*3/uL (ref 150–400)
RBC: 3.72 MIL/uL — ABNORMAL LOW (ref 3.87–5.11)
RDW: 16.2 % — AB (ref 11.5–15.5)
WBC: 6.8 10*3/uL (ref 4.0–10.5)

## 2015-03-29 LAB — CK TOTAL AND CKMB (NOT AT ARMC)
CK, MB: 0.9 ng/mL (ref 0.5–5.0)
RELATIVE INDEX: INVALID (ref 0.0–2.5)
Total CK: 57 U/L (ref 38–234)

## 2015-03-29 LAB — PROTIME-INR
INR: 1.09 (ref 0.00–1.49)
PROTHROMBIN TIME: 14.3 s (ref 11.6–15.2)

## 2015-03-29 LAB — APTT: aPTT: 30 seconds (ref 24–37)

## 2015-03-29 LAB — INFLUENZA PANEL BY PCR (TYPE A & B)
H1N1 flu by pcr: NOT DETECTED
INFLAPCR: NEGATIVE
INFLBPCR: NEGATIVE

## 2015-03-29 LAB — I-STAT TROPONIN, ED: Troponin i, poc: 0.01 ng/mL (ref 0.00–0.08)

## 2015-03-29 MED ORDER — FENTANYL CITRATE (PF) 100 MCG/2ML IJ SOLN
75.0000 ug | Freq: Once | INTRAMUSCULAR | Status: AC
  Administered 2015-03-29: 75 ug via INTRAVENOUS
  Filled 2015-03-29: qty 2

## 2015-03-29 MED ORDER — DEXTROSE 5 % IV SOLN
1.0000 g | Freq: Once | INTRAVENOUS | Status: DC
  Filled 2015-03-29: qty 1

## 2015-03-29 MED ORDER — SODIUM CHLORIDE 0.9 % IV BOLUS (SEPSIS): 500.0000 mL | Freq: Once | INTRAVENOUS | Status: DC

## 2015-03-29 MED ORDER — AZITHROMYCIN 250 MG PO TABS: 250.0000 mg | ORAL_TABLET | Freq: Every day | ORAL | Status: DC

## 2015-03-29 MED ORDER — VANCOMYCIN HCL 10 G IV SOLR
2000.0000 mg | Freq: Once | INTRAVENOUS | Status: DC
  Filled 2015-03-29: qty 2000

## 2015-03-29 MED ORDER — OXYCODONE-ACETAMINOPHEN 5-325 MG PO TABS: 1.0000 | ORAL_TABLET | ORAL | Status: DC | PRN

## 2015-03-29 MED ORDER — OXYCODONE HCL 5 MG PO TABS
10.0000 mg | ORAL_TABLET | Freq: Once | ORAL | Status: AC
  Administered 2015-03-29: 10 mg via ORAL
  Filled 2015-03-29: qty 2

## 2015-03-29 NOTE — Progress Notes (Signed)
ANTIBIOTIC CONSULT NOTE - INITIAL  Pharmacy Consult for Vancomycin Indication: HAP  Allergies  Allergen Reactions  . Morphine And Related Anaphylaxis    Patient says she stops breathing Can take percocet  . Prednisone Other (See Comments)    Other reaction(s): Other (See Comments) Muscle spasms Patient says prednisone causes her to cramp all over, muscle spasms uncontrolled  . Tuna [Fish Allergy] Itching, Swelling, Rash and Other (See Comments)    Face droops also    Patient Measurements:  Total Body Weight: 87.3 kg  Vital Signs: Temp: 99.6 F (37.6 C) (01/13 1517) Temp Source: Oral (01/13 1517) BP: 122/72 mmHg (01/13 1845) Pulse Rate: 84 (01/13 1845) Intake/Output from previous day:   Intake/Output from this shift:    Labs:  Recent Labs  03/29/15 1649  WBC 6.8  HGB 11.7*  PLT 209  CREATININE 10.39*   CrCl cannot be calculated (Unknown ideal weight.). No results for input(s): VANCOTROUGH, VANCOPEAK, VANCORANDOM, GENTTROUGH, GENTPEAK, GENTRANDOM, TOBRATROUGH, TOBRAPEAK, TOBRARND, AMIKACINPEAK, AMIKACINTROU, AMIKACIN in the last 72 hours.   Microbiology: No results found for this or any previous visit (from the past 720 hour(s)).  Medical History: Past Medical History  Diagnosis Date  . Blood transfusion   . Preterm labor   . Dialysis patient ([place])   . Chronic kidney disease   . Hypertension   . Asthma     as a child  . Headache(784.0)   . GERD (gastroesophageal reflux disease)     nexium prn  . Renal insufficiency     Medications:   (Not in a hospital admission) Scheduled:   Infusions:  . 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 Presents w/ chest pain and weakness. Pharmacy consulted to dose vancomycin for HAP. Afebrile, WBC 6.8, No cx results. No Abx given in HD per notes.  HD Schedule: Pt reported MWF, Last tx today, did not go on Wednesday due to pain.  Cefepime 1g IV given in  ED  1/13>>Cefepime(x1) 1/13>>Vancomycin>>  Goal of Therapy:  Vancomycin trough level 15-20 mcg/ml  Plan:  Vancomycin load 2g IV (x1) Measure antibiotic drug levels at steady state Follow up culture results, clinical corse Follow up HD schedule w/ pt, dose of Vanc will be 1g IV qHD  Georgiann Mohs, PharmD Student

## 2015-03-29 NOTE — ED Provider Notes (Addendum)
CSN: [id number]     Arrival date & time 03/29/15  1512 History   First MD Initiated Contact with Patient 03/29/15 1516     Chief Complaint  Patient presents with  . Chest Pain  . Weakness    HPI Patient presents to the emergency room with complaints of diffuse pain throughout her body. 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 She chronically takes Percocet 3 times a day. However, over the last couple of days she's had diffuse pain throughout her body and she has felt weak. Any movement whatsoever is very painful. Patient didn't go to dialysis on Wednesday because of her pain. She did go today and received a full treatment. Patient states she is having significant amount of back pain and leg pain arm pain and chest pain. She denies any vomiting but has had diarrhea although this is a chronic issue for her.. She has felt that she had a fever a couple of days ago. No rashes. No falls prior to the onset of the symptoms although she has fallen one families tried to help her get up since the initial onset of her symptoms. Patient states his symptoms are so severe now that she can't really walk without assistance just to go to the bathroom. Past Medical History  Diagnosis Date  . Blood transfusion   . Preterm labor   . Dialysis patient ([place])   . Chronic kidney disease   . Hypertension   . Asthma     as a child  . Headache(784.0)   . GERD (gastroesophageal reflux disease)     nexium prn  . Renal insufficiency    Past Surgical History  Procedure Laterality Date  . Renal biopsy    . Dilation and curettage of uterus    . Dilation and evacuation  08/02/2011    Procedure: DILATATION AND EVACUATION;  Surgeon: Logan Bores, MD;  Location: [facility];  Service: Gynecology;;  . Av fistula placement Left 2010  . Revision of arteriovenous goretex graft Left 02/11/2013    Procedure: REVISION OF ARTERIOVENOUS GORTEX FISTULA;  Surgeon: Rosetta Posner, MD;   Location: [facility];  Service: Vascular;  Laterality: Left;  . Dilation and evacuation N/A 08/31/2013    Procedure: DILATATION AND EVACUATION;  Surgeon: Woodroe Mode, MD;  Location: [facility];  Service: Gynecology;  Laterality: N/A;   Family History  Problem Relation Age of Onset  . Hypertension    . Diabetes type II    . Diabetes Mother   . Hyperlipidemia Mother   . Hypertension Mother   . Heart disease Mother    Social History  Substance Use Topics  . Smoking status: Passive Smoke Exposure - Never Smoker  . Smokeless tobacco: Never Used  . Alcohol Use: No   OB History    Gravida Para Term Preterm AB TAB SAB Ectopic Multiple Living   6 1 1  0 4 2 2  0 0 1     Review of Systems  All other systems reviewed and are negative.     Allergies  Morphine and related; Prednisone; and Tuna  Home Medications   Prior to Admission medications   Medication Sig Start Date End Date Taking? Authorizing Provider  alprazolam Duanne Moron) 2 MG tablet Take 1 tablet by mouth every 12 (twelve) hours as needed. for anxiety 03/26/15  Yes Historical Provider, MD  cyclobenzaprine (FLEXERIL) 5 MG tablet Take 5 mg by mouth 3 (three) times daily as  needed for muscle spasms.   Yes Historical Provider, MD  diphenhydrAMINE (BENADRYL) 25 MG tablet Take 50 mg by mouth daily.    Yes Historical Provider, MD  gabapentin (NEURONTIN) 100 MG capsule Take 300 mg by mouth 2 (two) times daily.    Yes Historical Provider, MD  multivitamin (RENA-VIT) TABS tablet Take 1 tablet by mouth at bedtime. 09/15/13  Yes Janece Canterbury, MD  oxyCODONE-acetaminophen (PERCOCET/ROXICET) 5-325 MG tablet Take 1 tablet by mouth every 4 (four) hours as needed for severe pain. 12/17/14  Yes Marella Chimes, PA-C  pantoprazole (PROTONIX) 40 MG tablet Take 40 mg by mouth daily.   Yes Historical Provider, MD  sevelamer carbonate (RENVELA) 2.4 G PACK Take 2.4 g by mouth 3 (three) times daily with meals.   Yes Historical Provider, MD  sodium  bicarbonate 325 MG tablet Take 325 mg by mouth 2 (two) times daily.   Yes Historical Provider, MD  HYDROcodone-acetaminophen (NORCO/VICODIN) 5-325 MG per tablet Take 1 tablet by mouth every 6 (six) hours as needed for moderate pain or severe pain. 01/28/14   Courtney Forcucci, PA-C  methocarbamol (ROBAXIN) 500 MG tablet Take 1 tablet (500 mg total) by mouth 2 (two) times daily. 01/05/14   Tatyana Kirichenko, PA-C  zolpidem (AMBIEN) 5 MG tablet Take 10 mg by mouth at bedtime as needed for sleep (sleep).    Historical Provider, MD   BP 122/72 mmHg  Pulse 84  Temp(Src) 99.6 F (37.6 C) (Oral)  Resp 20  SpO2 99%  LMP 03/29/2015 Physical Exam  Constitutional: She appears distressed.  HENT:  Head: Normocephalic and atraumatic.  Right Ear: External ear normal.  Left Ear: External ear normal.  Mouth/Throat: No oropharyngeal exudate.  Eyes: Conjunctivae are normal. Right eye exhibits no discharge. Left eye exhibits no discharge. No scleral icterus.  Neck: Neck supple. No tracheal deviation present.  Cardiovascular: Normal rate, regular rhythm and intact distal pulses.   Equal pulses all 4 extremities they're warm and well perfused  Pulmonary/Chest: Effort normal and breath sounds normal. No stridor. No respiratory distress. She has no wheezes. She has no rales. She exhibits tenderness (tenderness palpation diffusely on the chest wall).  Abdominal: Soft. Bowel sounds are normal. She exhibits no distension. There is no tenderness. There is no rebound and no guarding.  Musculoskeletal: She exhibits tenderness. She exhibits no edema.  Patient is diffusely tender to very light touch in her extremities, no effusions noted in the joints  Neurological: She is alert. She has normal strength. No cranial nerve deficit (no facial droop, extraocular movements intact, no slurred speech) or sensory deficit. She exhibits normal muscle tone. She displays no seizure activity. Coordination normal.  Skin: Skin is  warm and dry. No rash noted.  No petechiae, purpura or erythematous rashes noted  Psychiatric: She exhibits a depressed mood.  Nursing note and vitals reviewed.   ED Course  Procedures (including critical care time) Labs Review Labs Reviewed  CBC - Abnormal; Notable for the following:    RBC 3.72 (*)    Hemoglobin 11.7 (*)    HCT 35.7 (*)    RDW 16.2 (*)    All other components within normal limits  COMPREHENSIVE METABOLIC PANEL - Abnormal; Notable for the following:    Chloride 95 (*)    BUN 26 (*)    Creatinine, Ser 10.39 (*)    Total Protein 8.4 (*)    Albumin 3.3 (*)    AST 14 (*)    GFR calc non Af  Amer 4 (*)    GFR calc Af Amer 5 (*)    All other components within normal limits  APTT  PROTIME-INR  CK TOTAL AND CKMB (NOT AT [facility])  INFLUENZA PANEL BY PCR (TYPE A & B, H1N1)  I-STAT TROPOININ, ED    Imaging Review Dg Chest 2 View  03/29/2015  CLINICAL DATA:  Left-sided chest pain with generalized pain weakness 3 days. Back pain. EXAM: CHEST  2 VIEW COMPARISON:  09/08/2013 FINDINGS: Lungs are adequately inflated with hazy opacification over the posterior mid to lower lungs on the lateral film as cannot exclude an early infectious process. No evidence of effusion. Cardiomediastinal silhouette and remainder the exam is unchanged. IMPRESSION: Hazy opacification over the mid to lower lungs posteriorly on the lateral film as cannot exclude early infection. Recommend follow-up to resolution. Electronically Signed   By: Marin Olp M.D.   On: 03/29/2015 18:36   Dg Lumbar Spine Complete  03/29/2015  CLINICAL DATA:  Generalized low back pain.  Chronic renal failure EXAM: LUMBAR SPINE - COMPLETE 4+ VIEW COMPARISON:  None. FINDINGS: Frontal, lateral, spot lumbosacral lateral, and bilateral oblique views were obtained. The there are 5 non-rib-bearing lumbar type vertebral bodies. There is no fracture or spondylolisthesis. The disc spaces appear normal. There is no appreciable facet  arthropathy. No erosive change or bony destruction. IMPRESSION: No fracture or spondylolisthesis.  No appreciable arthropathy. Electronically Signed   By: Lowella Grip III M.D.   On: 03/29/2015 18:25   I have personally reviewed and evaluated these images and lab results as part of my medical decision-making.   EKG Interpretation   Date/Time:  Friday March 29 2015 17:25:58 EST Ventricular Rate:  97 PR Interval:  112 QRS Duration: 84 QT Interval:  396 QTC Calculation: 503 R Axis:   59 Text Interpretation:  Sinus rhythm Ventricular premature complex Aberrant  complex Borderline short PR interval Borderline prolonged QT interval No  significant change since last tracing Confirmed by Raeleen Winstanley  MD-J, Edynn Gillock  ([id number]) on 03/29/2015 5:32:28 PM     Medications  oxyCODONE (Oxy IR/ROXICODONE) immediate release tablet 10 mg (not administered)  ceFEPIme (MAXIPIME) 1 g in dextrose 5 % 50 mL IVPB (not administered)  fentaNYL (SUBLIMAZE) injection 75 mcg (75 mcg Intravenous Given 03/29/15 1626)  fentaNYL (SUBLIMAZE) injection 75 mcg (75 mcg Intravenous Given 03/29/15 1728)    MDM   Final diagnoses:  Myalgia  CAP (community acquired pneumonia)    Pt has continued to have persistent chest pain and diffuse body pain.  Labs are reassuring.  No evidence of rhabdo.  CXR with possible pna although no elevated WBC count and her symptoms arent typical.  Will start on iv abx to cover for pna, add on flu panel.  Consult with medical service   Dorie Rank, MD 03/29/15 1909  Dr Alcario Drought kindly evaluated the patient in the ED.  He obtained additional history from the patient and family.  They now state she has had this problem before after dialysis when they pull off more fluid.    Dr Alcario Drought gave her a fluid bolus.  Pt was offered admission but now the patient and her family do not want to be admitted to the hospital.   Dorie Rank, MD 03/29/15 2035

## 2015-03-29 NOTE — ED Notes (Signed)
Pt presents via [facility] from home c/o left side chest pain, generalized pain and generalized weakness.  Pt reports weakness x 3 days.  Pt had dialysis today, was sent home and then called EMS.  EMS found pt hypotensive-90s sys.  EKG 102 ST, 100% RA, 100cc NS received en route.  Pt a x 4, NAD.

## 2015-03-29 NOTE — ED Notes (Signed)
MD at bedside. 

## 2015-03-29 NOTE — Consult Note (Signed)
[facility] Medical Consultation  Desiree [name] [id number] DOB: [birth year] DOA: 03/29/2015 PCP: Windy Kalata, MD   Requesting physician: Dr. Tomi Bamberger Date of consultation: 03/29/15 Reason for consultation: Admission  Impression/Recommendations Principal Problem:   Muscle cramps Active Problems:   ESRD (end stage renal disease) ([place])    Muscle cramps in setting of hemodialysis and fluid removal -  Offered patient and family admission, they declined at this point  They want to avoid risk of fluid overload, so although I also offered them additional fluids, they wish to decline this as well now.  Per husband and family, they want to try going home overnight, and see if she can tolerate this, if not they say they will bring her back tomorrow.  Offered again to admit patient, they have refused admission.    Chief Complaint: Muscle craps  HPI:  40 yo F with h/o ESRD on dialysis MWF, patient missed dialysis W due to pain, had full run today with 3L off initially, but due to cramping they had to give back 600cc.  Given another 67cc in ambulance.  Patient has extensive (about 9 year per family) history of cramping problems just like this with dialysis when too much fluid is taken off too fast.  Her current EDW is 87.0, this was raised last just before Christmas from 83.0 they state.  She can tolerate up to 4L over this before developing fluid overload symptoms.  Currently weights 88.9 in ED.  Review of Systems:  12 systems reviewed and otherwise negative, negative for fever, chills, cough, congestion, SOB.  Past Medical History  Diagnosis Date  . Blood transfusion   . Preterm labor   . Dialysis patient ([place])   . Chronic kidney disease   . Hypertension   . Asthma     as a child  . Headache(784.0)   . GERD (gastroesophageal reflux disease)     nexium prn  . Renal insufficiency    Past Surgical History  Procedure Laterality Date  . Renal biopsy    . Dilation  and curettage of uterus    . Dilation and evacuation  08/02/2011    Procedure: DILATATION AND EVACUATION;  Surgeon: Logan Bores, MD;  Location: [facility];  Service: Gynecology;;  . Av fistula placement Left 2010  . Revision of arteriovenous goretex graft Left 02/11/2013    Procedure: REVISION OF ARTERIOVENOUS GORTEX FISTULA;  Surgeon: Rosetta Posner, MD;  Location: [facility];  Service: Vascular;  Laterality: Left;  . Dilation and evacuation N/A 08/31/2013    Procedure: DILATATION AND EVACUATION;  Surgeon: Woodroe Mode, MD;  Location: [facility];  Service: Gynecology;  Laterality: N/A;   Social History:  reports that she has been passively smoking.  She has never used smokeless tobacco. She reports that she uses illicit drugs (Marijuana). She reports that she does not drink alcohol.  Allergies  Allergen Reactions  . Morphine And Related Anaphylaxis    Patient says she stops breathing Can take percocet  . Prednisone Other (See Comments)    Other reaction(s): Other (See Comments) Muscle spasms Patient says prednisone causes her to cramp all over, muscle spasms uncontrolled  . Geralyn Flash [Fish Allergy] Itching, Swelling, Rash and Other (See Comments)    Face droops also   Family History  Problem Relation Age of Onset  . Hypertension    . Diabetes type II    . Diabetes Mother   . Hyperlipidemia Mother   . Hypertension Mother   . Heart disease  Mother     Prior to Admission medications   Medication Sig Start Date End Date Taking? Authorizing Provider  alprazolam Duanne Moron) 2 MG tablet Take 1 tablet by mouth every 12 (twelve) hours as needed. for anxiety 03/26/15  Yes Historical Provider, MD  cyclobenzaprine (FLEXERIL) 5 MG tablet Take 5 mg by mouth 3 (three) times daily as needed for muscle spasms.   Yes Historical Provider, MD  diphenhydrAMINE (BENADRYL) 25 MG tablet Take 50 mg by mouth daily.    Yes Historical Provider, MD  gabapentin (NEURONTIN) 100 MG capsule Take 300 mg by mouth 2 (two) times  daily.    Yes Historical Provider, MD  multivitamin (RENA-VIT) TABS tablet Take 1 tablet by mouth at bedtime. 09/15/13  Yes Janece Canterbury, MD  oxyCODONE-acetaminophen (PERCOCET/ROXICET) 5-325 MG tablet Take 1 tablet by mouth every 4 (four) hours as needed for severe pain. 12/17/14  Yes Marella Chimes, PA-C  pantoprazole (PROTONIX) 40 MG tablet Take 40 mg by mouth daily.   Yes Historical Provider, MD  sevelamer carbonate (RENVELA) 2.4 G PACK Take 2.4 g by mouth 3 (three) times daily with meals.   Yes Historical Provider, MD  sodium bicarbonate 325 MG tablet Take 325 mg by mouth 2 (two) times daily.   Yes Historical Provider, MD  HYDROcodone-acetaminophen (NORCO/VICODIN) 5-325 MG per tablet Take 1 tablet by mouth every 6 (six) hours as needed for moderate pain or severe pain. 01/28/14   Courtney Forcucci, PA-C  methocarbamol (ROBAXIN) 500 MG tablet Take 1 tablet (500 mg total) by mouth 2 (two) times daily. 01/05/14   Tatyana Kirichenko, PA-C  zolpidem (AMBIEN) 5 MG tablet Take 10 mg by mouth at bedtime as needed for sleep (sleep).    Historical Provider, MD   Physical Exam: Blood pressure 112/71, pulse 93, temperature 99.6 F (37.6 C), temperature source Oral, resp. rate 17, height 5\' 5"  (1.651 m), weight 88.905 kg (196 lb), last menstrual period 03/29/2015, SpO2 100 %. Filed Vitals:   03/29/15 1900 03/29/15 1915  BP: 110/73 112/71  Pulse: 86 93  Temp:    Resp: 16 17    General:  NAD, resting comfortably in bed, slightly sleepy from narcotics Eyes: PEERLA EOMI ENT: mucous membranes moist Neck: supple w/o JVD Cardiovascular: RRR w/o MRG Respiratory: CTA B Abdomen: soft, nt, nd, bs+ Skin: no rash nor lesion Musculoskeletal: MAE, full ROM all 4 extremities Psychiatric: normal tone and affect Neurologic: AAOx3, grossly non-focal   Labs on Admission:  Basic Metabolic Panel:  Recent Labs Lab 03/29/15 1649  NA 139  K 4.1  CL 95*  CO2 29  GLUCOSE 95  BUN 26*  CREATININE  10.39*  CALCIUM 9.2   Liver Function Tests:  Recent Labs Lab 03/29/15 1649  AST 14*  ALT 15  ALKPHOS 59  BILITOT 0.4  PROT 8.4*  ALBUMIN 3.3*   No results for input(s): LIPASE, AMYLASE in the last 168 hours. No results for input(s): AMMONIA in the last 168 hours. CBC:  Recent Labs Lab 03/29/15 1649  WBC 6.8  HGB 11.7*  HCT 35.7*  MCV 96.0  PLT 209   Cardiac Enzymes:  Recent Labs Lab 03/29/15 1649  CKTOTAL 57  CKMB 0.9   BNP: Invalid input(s): POCBNP CBG: No results for input(s): GLUCAP in the last 168 hours.  Radiological Exams on Admission: Dg Chest 2 View  03/29/2015  CLINICAL DATA:  Left-sided chest pain with generalized pain weakness 3 days. Back pain. EXAM: CHEST  2 VIEW COMPARISON:  09/08/2013 FINDINGS:  Lungs are adequately inflated with hazy opacification over the posterior mid to lower lungs on the lateral film as cannot exclude an early infectious process. No evidence of effusion. Cardiomediastinal silhouette and remainder the exam is unchanged. IMPRESSION: Hazy opacification over the mid to lower lungs posteriorly on the lateral film as cannot exclude early infection. Recommend follow-up to resolution. Electronically Signed   By: Marin Olp M.D.   On: 03/29/2015 18:36   Dg Lumbar Spine Complete  03/29/2015  CLINICAL DATA:  Generalized low back pain.  Chronic renal failure EXAM: LUMBAR SPINE - COMPLETE 4+ VIEW COMPARISON:  None. FINDINGS: Frontal, lateral, spot lumbosacral lateral, and bilateral oblique views were obtained. The there are 5 non-rib-bearing lumbar type vertebral bodies. There is no fracture or spondylolisthesis. The disc spaces appear normal. There is no appreciable facet arthropathy. No erosive change or bony destruction. IMPRESSION: No fracture or spondylolisthesis.  No appreciable arthropathy. Electronically Signed   By: Lowella Grip III M.D.   On: 03/29/2015 18:25    EKG: Independently reviewed.  Time spent: 80 min  GARDNER,  JARED M. [facility] Pager [phone]  If 7PM-7AM, please contact night-coverage [email or web address] Password [email or web address] 03/29/2015, 8:22 PM

## 2015-03-29 NOTE — ED Notes (Signed)
Patient transported to X-ray 

## 2015-04-16 IMAGING — US US OB TRANSVAGINAL
1 series · 13 of 28 positions shown · non-contrast
Comparison: CT abdomen and pelvis 08/29/2013. Ultrasound
08/22/2013.

CLINICAL DATA: Abdominal pain after D and C 08/31/13. Positive urine
pregnancy test. Quantitative beta HCG is not available.

EXAM:
TRANSVAGINAL OB ULTRASOUND
TECHNIQUE: Transvaginal ultrasound was performed for complete evaluation of the
gestation as well as the maternal uterus, adnexal regions, and
pelvic cul-de-sac.

[Series 1: us ob transvaginal · 0.18mm/px · 13 of 30 slices shown]
[im 2/30]
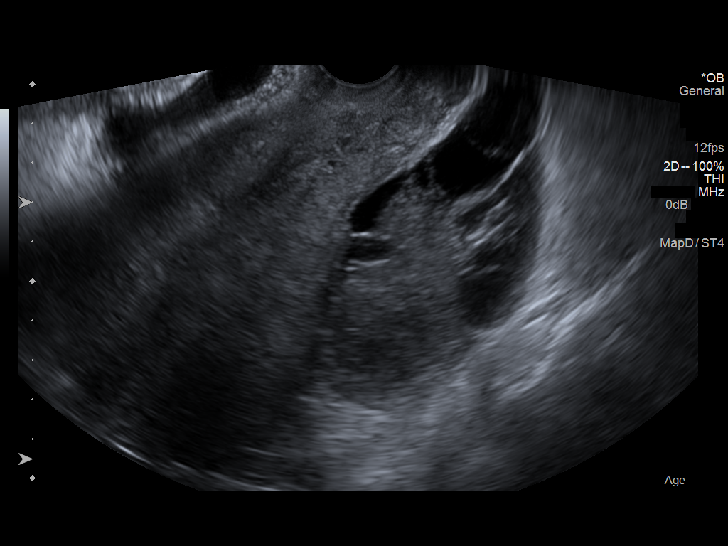
[im 4/30]
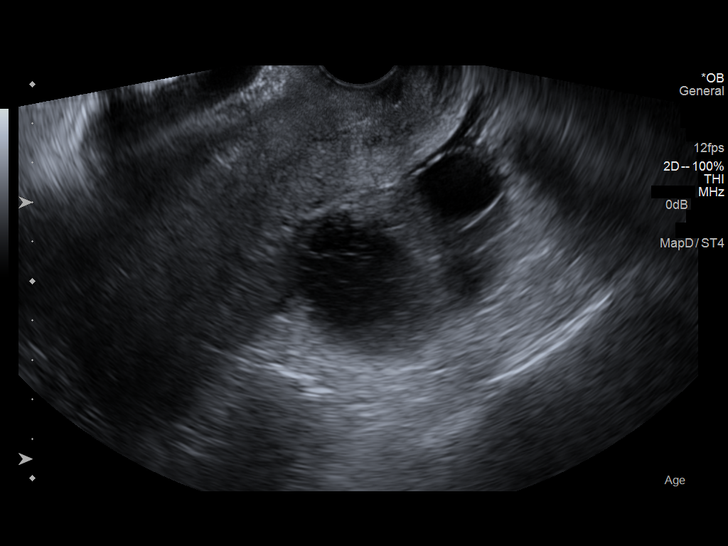
[im 6/30]
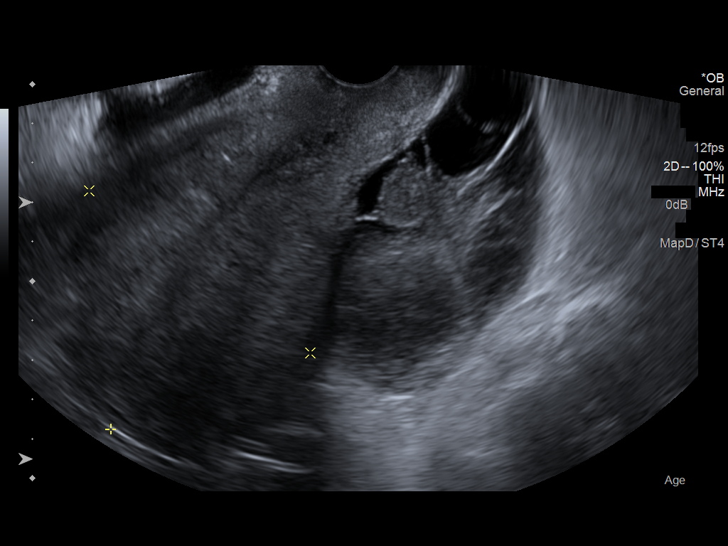
[im 8/30]
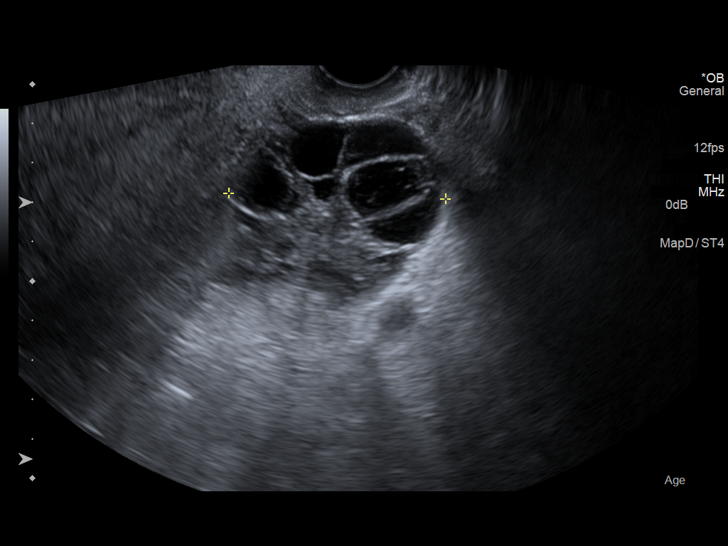
[im 10/30]
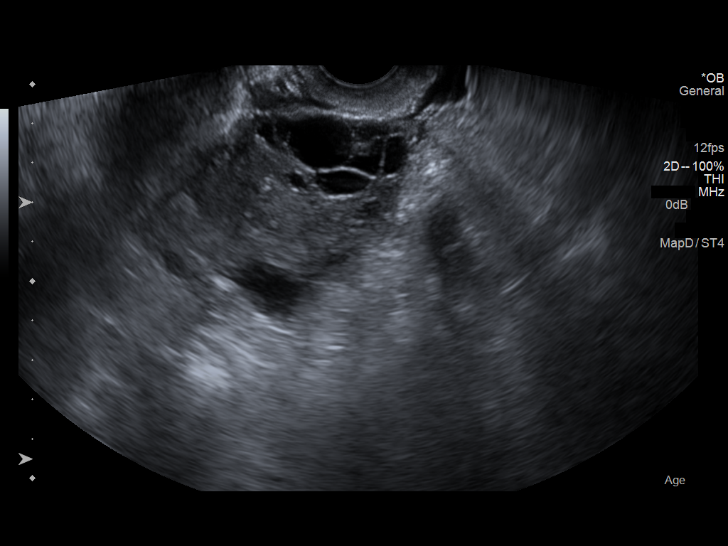
[im 12/30]
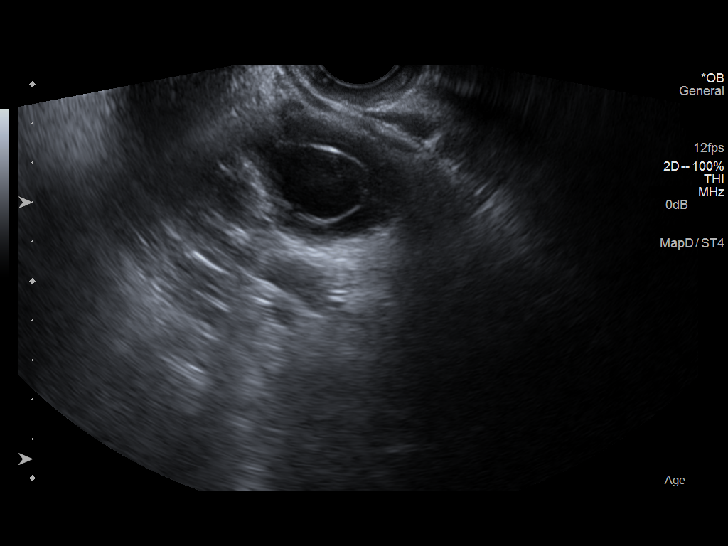
[im 16/30]
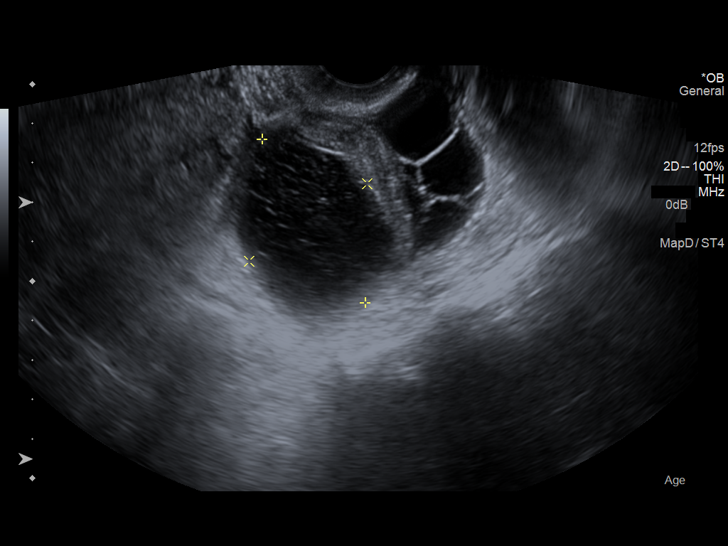
[im 18/30]
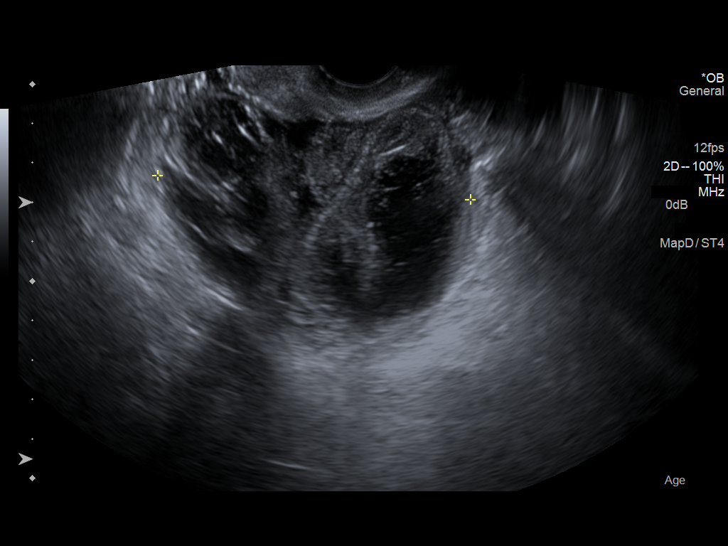
[im 20/30]
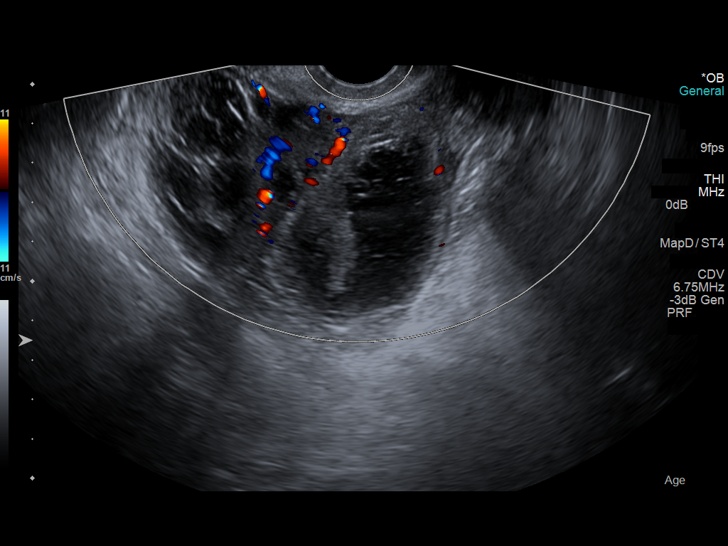
[im 22/30]
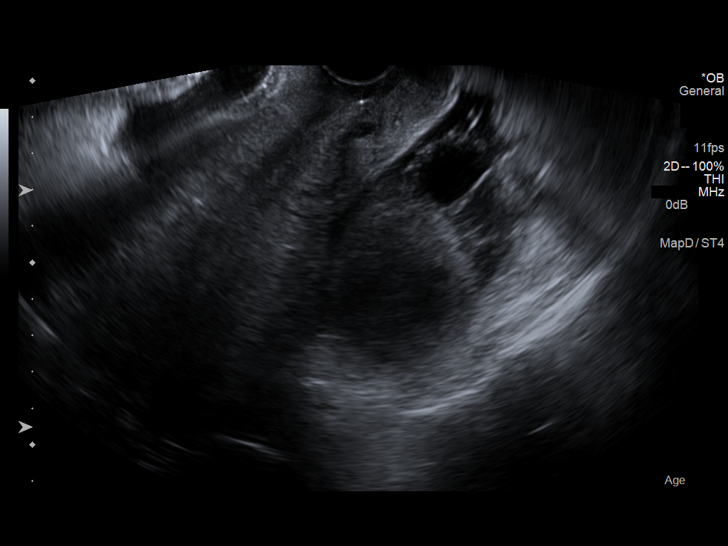
[im 24/30]
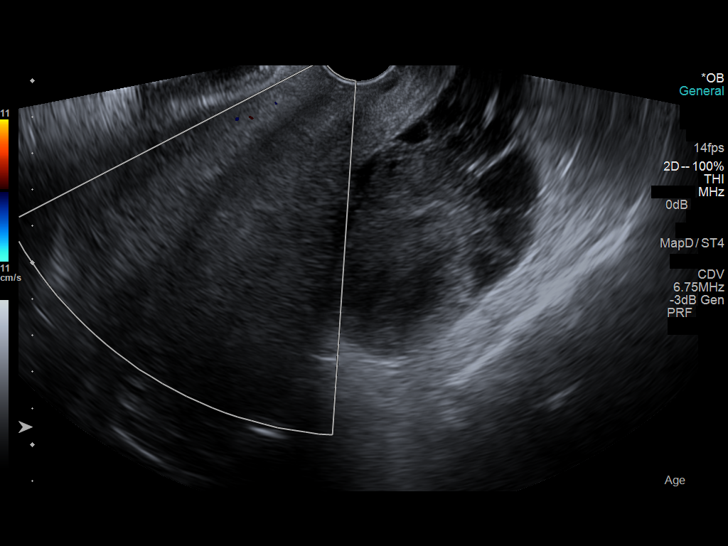
[im 26/30]
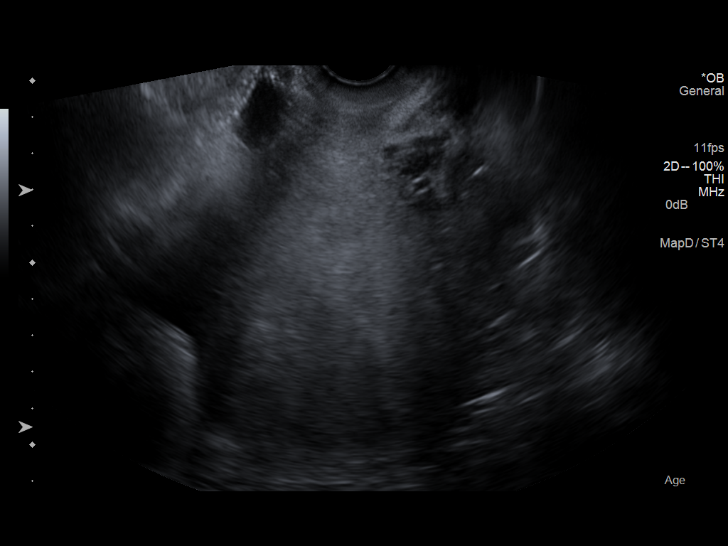
[im 28/30]
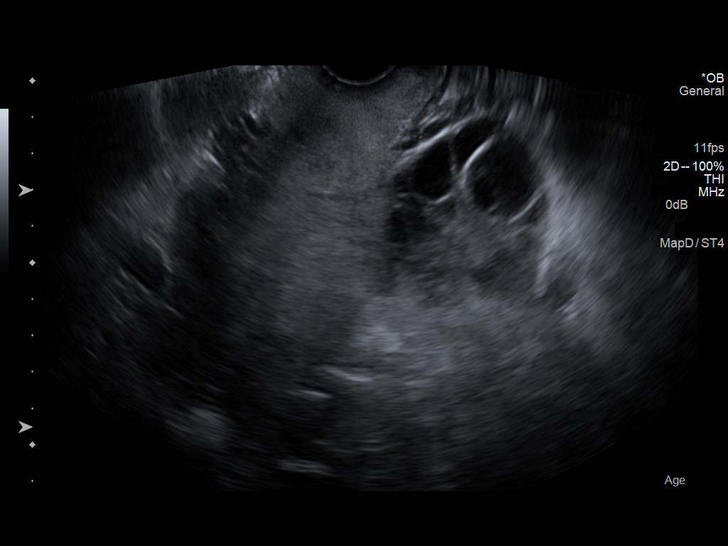

[13 of 28 positions shown; findings below may reference images not displayed]

FINDINGS: Intrauterine gestational sac: No intrauterine pregnancy is
demonstrated.

Yolk sac:  Not demonstrated.

Embryo:  Not demonstrated.

Cardiac Activity: Not demonstrated.

Maternal uterus/adnexae: The uterus is slightly retroverted,
measuring 12.8 x 7 x 7.3 cm. Normal homogeneous appearance of the
endometrial stripe with thickness measured at 9.2 mm.

Right ovary measures 6.9 x 5.1 x 8 cm and contains multiple cysts
including a dominant cyst measuring 4.9 x 3.6 x 3 cm. This dominant
cyst is multiple internal echoes and likely represents a hemorrhagic
cyst. The dominant cysts larger than on the prior study and
hemorrhage is new.

Left ovary measures 5.3 x 4.8 x 4.4 cm. Normal follicular changes
are demonstrated.

Small amount of free fluid in the pelvis.
IMPRESSION: No residual intrauterine pregnancy is demonstrated. Endometrial
stripe thickness is normal. Dominant hemorrhagic appearing cyst in
the right ovary. Small amount of free fluid in the pelvis.

## 2015-04-16 IMAGING — CT CT ABD-PELV W/O CM
2 of 4 series · 17 of 46 positions shown, 19 images · non-contrast
Comparison: 08/29/2013

CLINICAL DATA: Sharp right abdominal pain. Patient had D and C
08/31/2013 after missed abortion 08/29/2013. White cell count 11.
Microhematuria.

EXAM:
CT ABDOMEN AND PELVIS WITHOUT CONTRAST
TECHNIQUE: Multidetector CT imaging of the abdomen and pelvis was performed
following the standard protocol without IV contrast.

[Series 2: stone under 200# w/ prev · axial · 0.74mm/px · z∈[+834,+1169]mm · 14 of 73 slices shown, 16 images]
[im 3/73  soft-tissue]
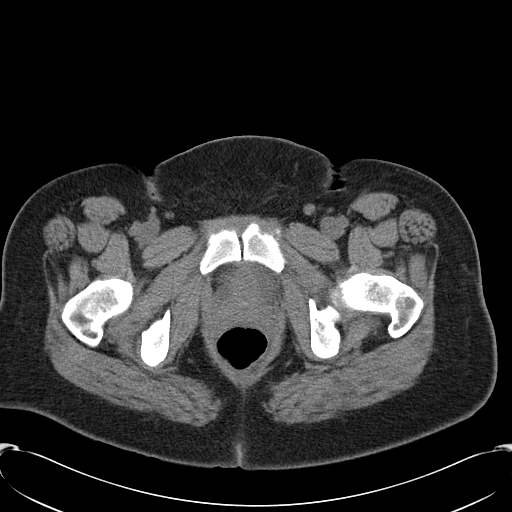
[im 3/73  bone]
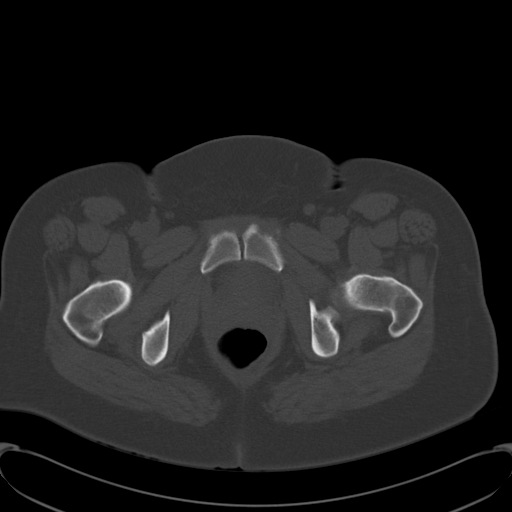
[im 9/73  soft-tissue]
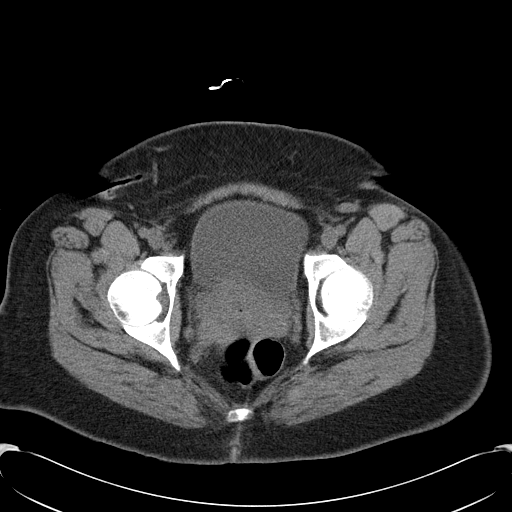
[im 14/73  soft-tissue]
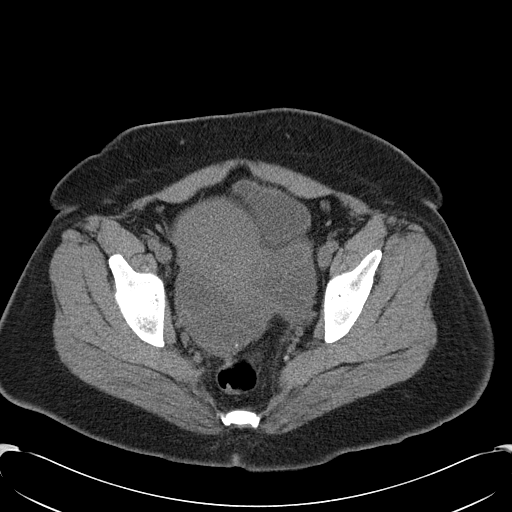
[im 19/73  soft-tissue]
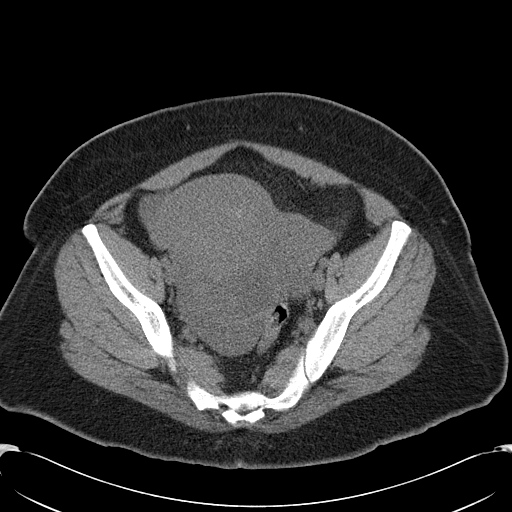
[im 25/73  soft-tissue]
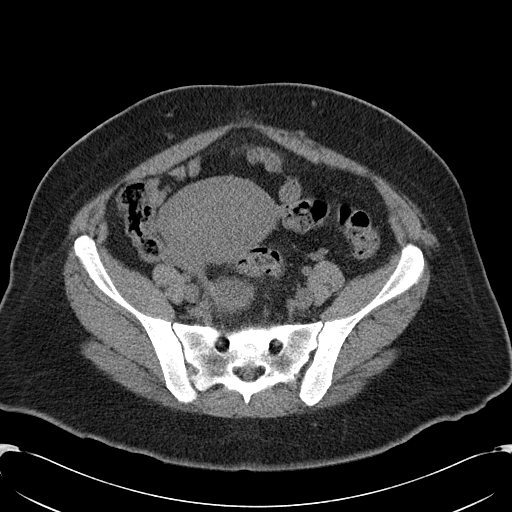
[im 30/73  soft-tissue]
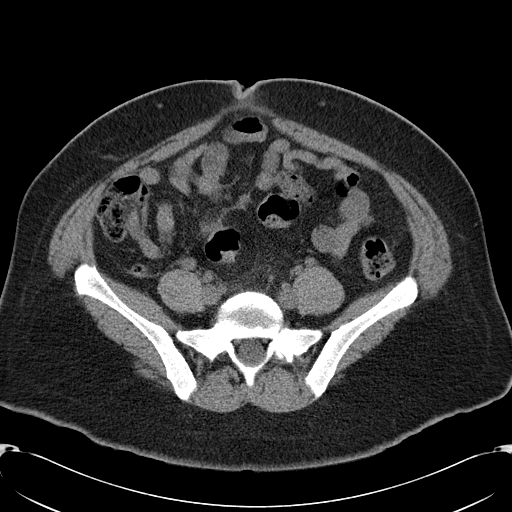
[im 35/73  soft-tissue]
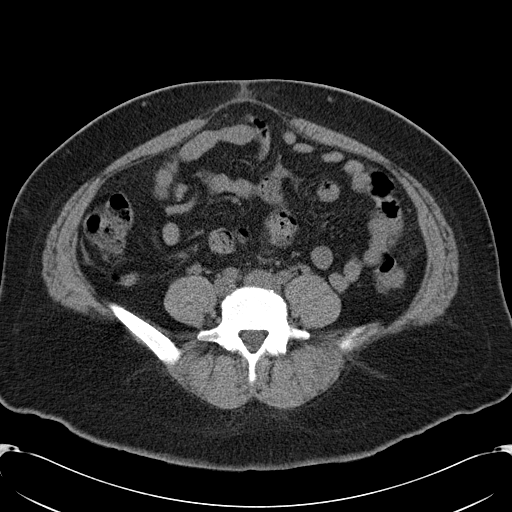
[im 38/73  soft-tissue]
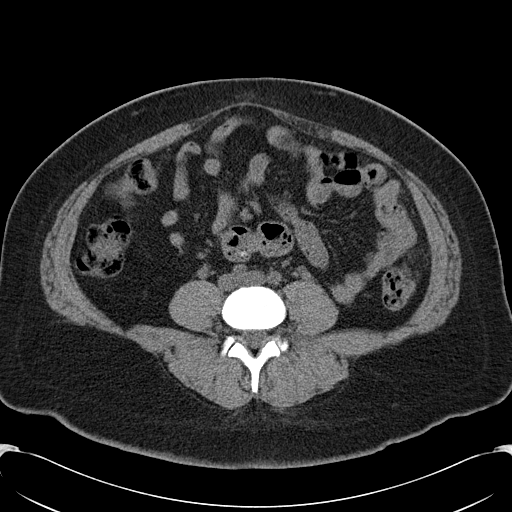
[im 43/73  soft-tissue]
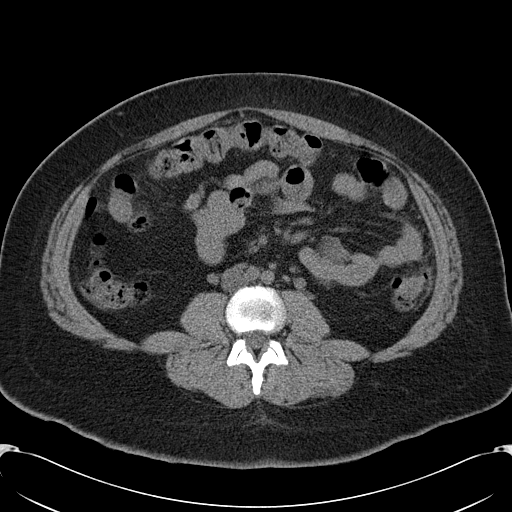
[im 43/73  bone]
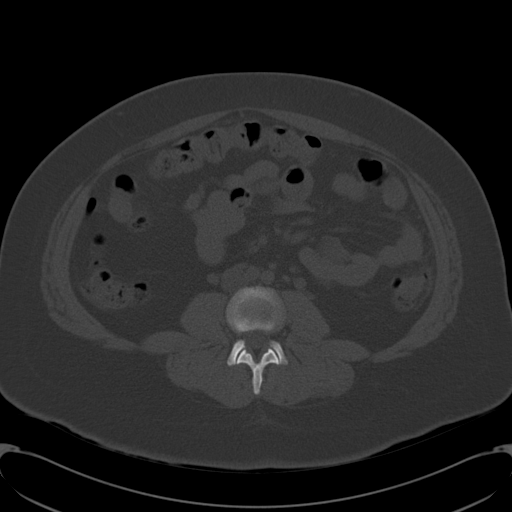
[im 49/73  soft-tissue]
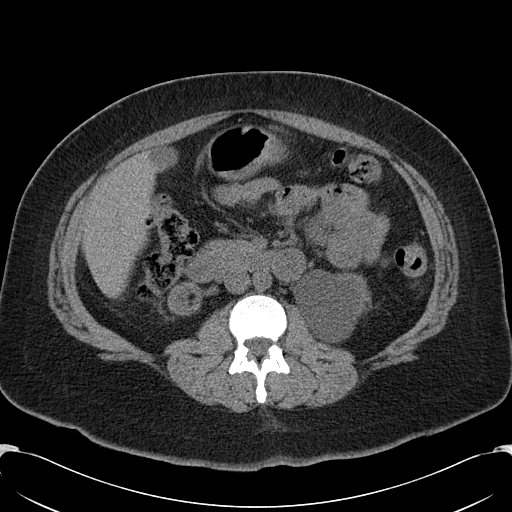
[im 54/73  soft-tissue]
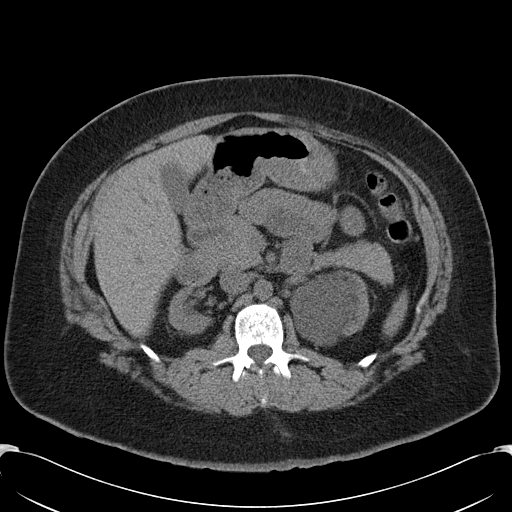
[im 59/73  soft-tissue]
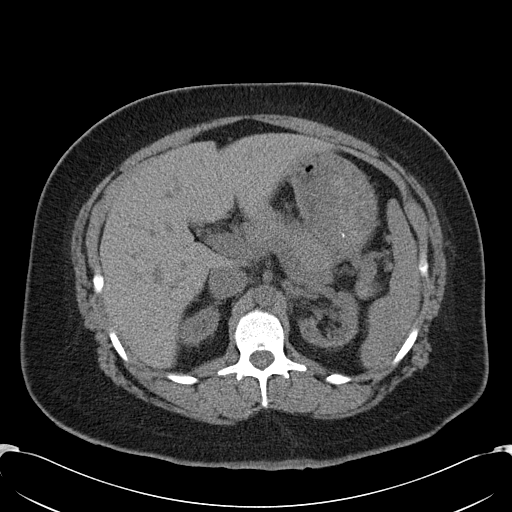
[im 65/73  soft-tissue]
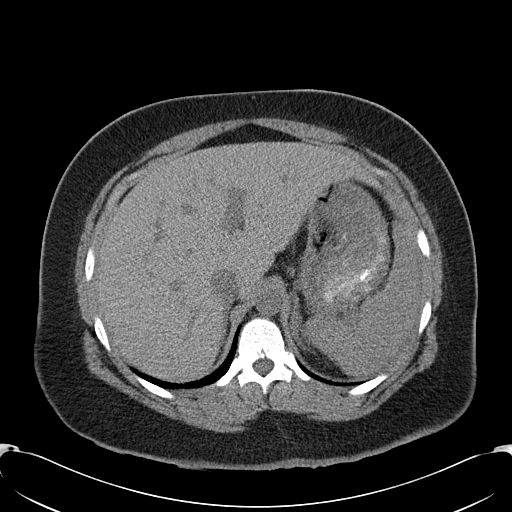
[im 70/73  soft-tissue]
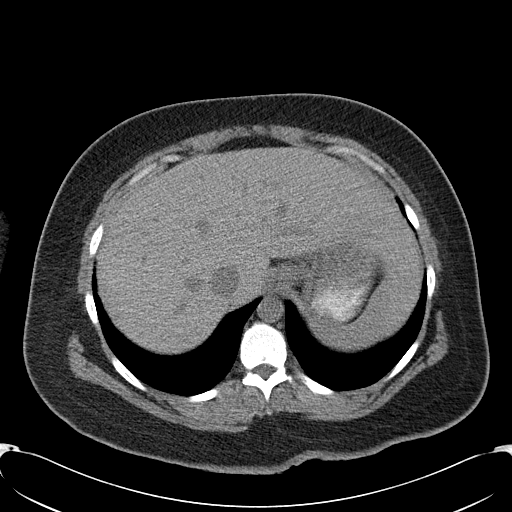

[Series 602: <mpr thick range> · coronal · 0.75mm/px · 3 of 83 slices shown]
[im 28/83  soft-tissue]
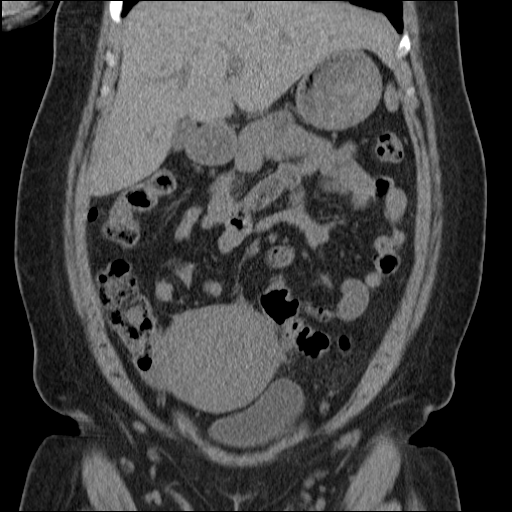
[im 37/83  soft-tissue]
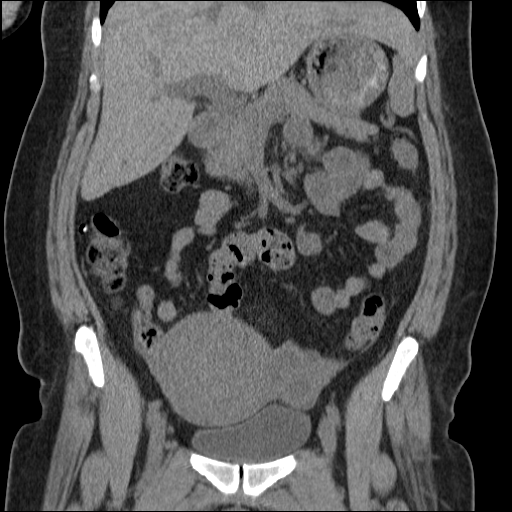
[im 46/83  soft-tissue]
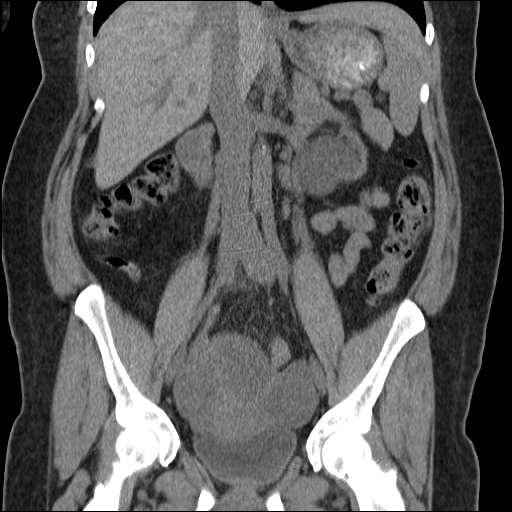

[17 of 46 positions shown; findings below may reference images not displayed]

FINDINGS: The lung bases are clear.

Large cyst in the lower pole left kidney measuring 5.8 cm diameter
as seen on prior study. Bilateral renal parenchymal atrophy. No
renal, ureteral, or bladder stone is identified. No bladder wall
thickening. No hydronephrosis or hydroureter.

The unenhanced appearance of the liver, spleen, gallbladder,
pancreas, adrenal glands, abdominal aorta, inferior vena cava, and
retroperitoneal lymph nodes is unremarkable. The stomach, small
bowel, and colon are not abnormally distended. No free air or free
fluid in the abdomen.

Pelvis: Prominent enlargement of the uterus consistent with recent
postpartum state. Uterus appears to be decreasing in size since
prior study. Cystic changes in the ovaries bilaterally are better
demonstrated on prior CT and ultrasound. No definite evidence of any
free or loculated pelvic fluid collection otherwise. There is no
evidence of diverticulitis. Increased density in the appendix may
represent residual contrast material. There is no appendiceal
dilatation or inflammatory change. No destructive bone lesions.
IMPRESSION: No acute changes identified since prior study, allowing for
differences in technique. Again demonstrated is bilateral renal
parenchymal atrophy with large cyst in the left kidney. No evidence
of any renal or ureteral stone or obstruction. Enlarged uterus
consistent with postpartum state. Bilateral ovarian cysts are better
demonstrated on previous imaging.

## 2015-09-02 ENCOUNTER — Emergency Department (HOSPITAL_COMMUNITY)
Admission: EM | Admit: 2015-09-02 | Discharge: 2015-09-02 | Disposition: A | Discharge status: Home / Self Care | Payer: Medicare Other | Attending: Emergency Medicine | Admitting: Emergency Medicine

## 2015-09-02 ENCOUNTER — Encounter (HOSPITAL_COMMUNITY): Payer: Self-pay | Admitting: Emergency Medicine

## 2015-09-02 ENCOUNTER — Emergency Department (EMERGENCY_DEPARTMENT_HOSPITAL)
Admit: 2015-09-02 | Discharge: 2015-09-02 | Disposition: A | Discharge status: Home / Self Care | Payer: Medicare Other | Attending: Emergency Medicine | Admitting: Emergency Medicine

## 2015-09-02 DIAGNOSIS — R600 Localized edema: Secondary | ICD-10-CM | POA: Diagnosis not present

## 2015-09-02 DIAGNOSIS — I120 Hypertensive chronic kidney disease with stage 5 chronic kidney disease or end stage renal disease: Secondary | ICD-10-CM | POA: Insufficient documentation

## 2015-09-02 DIAGNOSIS — J45909 Unspecified asthma, uncomplicated: Secondary | ICD-10-CM | POA: Diagnosis not present

## 2015-09-02 DIAGNOSIS — Z992 Dependence on renal dialysis: Secondary | ICD-10-CM | POA: Insufficient documentation

## 2015-09-02 DIAGNOSIS — M7989 Other specified soft tissue disorders: Secondary | ICD-10-CM | POA: Diagnosis present

## 2015-09-02 DIAGNOSIS — Z79899 Other long term (current) drug therapy: Secondary | ICD-10-CM | POA: Diagnosis not present

## 2015-09-02 DIAGNOSIS — N186 End stage renal disease: Secondary | ICD-10-CM | POA: Insufficient documentation

## 2015-09-02 DIAGNOSIS — Z7722 Contact with and (suspected) exposure to environmental tobacco smoke (acute) (chronic): Secondary | ICD-10-CM | POA: Insufficient documentation

## 2015-09-02 DIAGNOSIS — R609 Edema, unspecified: Secondary | ICD-10-CM

## 2015-09-02 LAB — COMPREHENSIVE METABOLIC PANEL
ALT: 10 U/L — ABNORMAL LOW (ref 14–54)
ANION GAP: 18 — AB (ref 5–15)
AST: 15 U/L (ref 15–41)
Albumin: 3.7 g/dL (ref 3.5–5.0)
Alkaline Phosphatase: 63 U/L (ref 38–126)
BILIRUBIN TOTAL: 0.5 mg/dL (ref 0.3–1.2)
BUN: 78 mg/dL — ABNORMAL HIGH (ref 6–20)
CO2: 20 mmol/L — ABNORMAL LOW (ref 22–32)
Calcium: 9.3 mg/dL (ref 8.9–10.3)
Chloride: 97 mmol/L — ABNORMAL LOW (ref 101–111)
Creatinine, Ser: 22.63 mg/dL — ABNORMAL HIGH (ref 0.44–1.00)
GFR calc Af Amer: 2 mL/min — ABNORMAL LOW (ref >60)
GFR, EST NON AFRICAN AMERICAN: 2 mL/min — AB (ref >60)
Glucose, Bld: 74 mg/dL (ref 65–99)
POTASSIUM: 5.2 mmol/L — AB (ref 3.5–5.1)
Sodium: 135 mmol/L (ref 135–145)
TOTAL PROTEIN: 8.1 g/dL (ref 6.5–8.1)

## 2015-09-02 LAB — CBC WITH DIFFERENTIAL/PLATELET
Basophils Absolute: 0 10*3/uL (ref 0.0–0.1)
Basophils Relative: 0 %
EOS PCT: 5 %
Eosinophils Absolute: 0.4 10*3/uL (ref 0.0–0.7)
HCT: 34.3 % — ABNORMAL LOW (ref 36.0–46.0)
Hemoglobin: 11.1 g/dL — ABNORMAL LOW (ref 12.0–15.0)
LYMPHS ABS: 2.4 10*3/uL (ref 0.7–4.0)
LYMPHS PCT: 31 %
MCH: 31.1 pg (ref 26.0–34.0)
MCHC: 32.4 g/dL (ref 30.0–36.0)
MCV: 96.1 fL (ref 78.0–100.0)
MONO ABS: 0.6 10*3/uL (ref 0.1–1.0)
Monocytes Relative: 7 %
Neutro Abs: 4.5 10*3/uL (ref 1.7–7.7)
Neutrophils Relative %: 57 %
PLATELETS: 247 10*3/uL (ref 150–400)
RBC: 3.57 MIL/uL — ABNORMAL LOW (ref 3.87–5.11)
RDW: 17.1 % — AB (ref 11.5–15.5)
WBC: 8 10*3/uL (ref 4.0–10.5)

## 2015-09-02 MED ORDER — KETOROLAC TROMETHAMINE 30 MG/ML IJ SOLN
30.0000 mg | Freq: Once | INTRAMUSCULAR | Status: AC
  Administered 2015-09-02: 30 mg via INTRAVENOUS
  Filled 2015-09-02: qty 1

## 2015-09-02 NOTE — ED Provider Notes (Signed)
CSN: [id number]     Arrival date & time 09/02/15  1452 History   First MD Initiated Contact with Patient 09/02/15 1726     Chief Complaint  Patient presents with  . Leg Swelling     (Consider location/radiation/quality/duration/timing/severity/associated sxs/prior Treatment) HPI   Blood pressure 117/73, pulse 107, temperature 100 F (37.8 C), temperature source Oral, resp. rate 16, height 5\' 5"  (1.651 m), weight 94.575 kg, last menstrual period 08/15/2015, SpO2 100 %.  Desiree [name] is a 40 y.o. female S medical history significant for ESRD complaining of not to left leg which she noticed 3 days ago and bilateral lower extremity edema worsening over the last 2 days, pain is severe, it has not helped with her oxycodone which she takes for chronic right upper extremity pain. She denies history of DVT/PE, recent immobilization, exogenous estrogen, calf pain, leg swelling, chest pain, shortness of breath, palpitations. Does not typically get lower extremity edema. She missed her dialysis on Saturday because the pain was so severe, presents to the ED today at the insistence of her friend and daughter. States that her temperature is always mildly elevated, she recently had a upper respiratory infection that was treated with a Z-Pak and states she feels much better.  Past Medical History  Diagnosis Date  . Blood transfusion   . Preterm labor   . Dialysis patient ([place])   . Chronic kidney disease   . Hypertension   . Asthma     as a child  . Headache(784.0)   . GERD (gastroesophageal reflux disease)     nexium prn  . Renal insufficiency    Past Surgical History  Procedure Laterality Date  . Renal biopsy    . Dilation and curettage of uterus    . Dilation and evacuation  08/02/2011    Procedure: DILATATION AND EVACUATION;  Surgeon: Logan Bores, MD;  Location: [facility];  Service: Gynecology;;  . Av fistula placement Left 2010  . Revision of arteriovenous goretex graft Left 02/11/2013     Procedure: REVISION OF ARTERIOVENOUS GORTEX FISTULA;  Surgeon: Rosetta Posner, MD;  Location: [facility];  Service: Vascular;  Laterality: Left;  . Dilation and evacuation N/A 08/31/2013    Procedure: DILATATION AND EVACUATION;  Surgeon: Woodroe Mode, MD;  Location: [facility];  Service: Gynecology;  Laterality: N/A;   Family History  Problem Relation Age of Onset  . Hypertension    . Diabetes type II    . Diabetes Mother   . Hyperlipidemia Mother   . Hypertension Mother   . Heart disease Mother    Social History  Substance Use Topics  . Smoking status: Passive Smoke Exposure - Never Smoker  . Smokeless tobacco: Never Used  . Alcohol Use: No   OB History    Gravida Para Term Preterm AB TAB SAB Ectopic Multiple Living   6 1 1  0 4 2 2  0 0 1     Review of Systems  10 systems reviewed and found to be negative, except as noted in the HPI.  Allergies  Morphine and related; Prednisone; and Tuna  Home Medications   Prior to Admission medications   Medication Sig Start Date End Date Taking? Authorizing Provider  alprazolam Duanne Moron) 2 MG tablet Take 1 tablet by mouth every 12 (twelve) hours as needed. for anxiety 03/26/15   Historical Provider, MD  azithromycin (ZITHROMAX) 250 MG tablet Take 1 tablet (250 mg total) by mouth daily. Take first 2 tablets together, then  1 every day until finished. 03/29/15   Dorie Rank, MD  cyclobenzaprine (FLEXERIL) 5 MG tablet Take 5 mg by mouth 3 (three) times daily as needed for muscle spasms.    Historical Provider, MD  diphenhydrAMINE (BENADRYL) 25 MG tablet Take 50 mg by mouth daily.     Historical Provider, MD  gabapentin (NEURONTIN) 100 MG capsule Take 300 mg by mouth 2 (two) times daily.     Historical Provider, MD  HYDROcodone-acetaminophen (NORCO/VICODIN) 5-325 MG per tablet Take 1 tablet by mouth every 6 (six) hours as needed for moderate pain or severe pain. 01/28/14   Courtney Forcucci, PA-C  methocarbamol (ROBAXIN) 500 MG tablet Take 1 tablet (500 mg  total) by mouth 2 (two) times daily. 01/05/14   Tatyana Kirichenko, PA-C  multivitamin (RENA-VIT) TABS tablet Take 1 tablet by mouth at bedtime. 09/15/13   Janece Canterbury, MD  oxyCODONE-acetaminophen (PERCOCET/ROXICET) 5-325 MG tablet Take 1 tablet by mouth every 4 (four) hours as needed for severe pain. 03/29/15   Dorie Rank, MD  pantoprazole (PROTONIX) 40 MG tablet Take 40 mg by mouth daily.    Historical Provider, MD  sevelamer carbonate (RENVELA) 2.4 G PACK Take 2.4 g by mouth 3 (three) times daily with meals.    Historical Provider, MD  sodium bicarbonate 325 MG tablet Take 325 mg by mouth 2 (two) times daily.    Historical Provider, MD  zolpidem (AMBIEN) 5 MG tablet Take 10 mg by mouth at bedtime as needed for sleep (sleep).    Historical Provider, MD   BP 135/84 mmHg  Pulse 83  Temp(Src) 98.9 F (37.2 C) (Oral)  Resp 18  Ht 5\' 5"  (1.651 m)  Wt 94.575 kg  BMI 34.70 kg/m2  SpO2 100%  LMP 08/15/2015 Physical Exam  Constitutional: She is oriented to person, place, and time. She appears well-developed and well-nourished. No distress.  HENT:  Head: Normocephalic and atraumatic.  Mouth/Throat: Oropharynx is clear and moist.  Eyes: Conjunctivae and EOM are normal. Pupils are equal, round, and reactive to light.  Neck: Normal range of motion.  Cardiovascular: Normal rate, regular rhythm and intact distal pulses.   Fistula to left forearm with good thrill  Pulmonary/Chest: Effort normal and breath sounds normal.  Abdominal: Soft. Bowel sounds are normal. She exhibits no distension. There is no tenderness. There is no rebound and no guarding.  Musculoskeletal: Normal range of motion. She exhibits edema and tenderness.  2+ pitting edema to bilateral lower extremities to ankle. No calf tenderness palpation.  Neurological: She is alert and oriented to person, place, and time.  Skin: She is not diaphoretic.  Psychiatric: She has a normal mood and affect.  Nursing note and vitals  reviewed.   ED Course  Procedures (including critical care time) Labs Review Labs Reviewed  CBC WITH DIFFERENTIAL/PLATELET - Abnormal; Notable for the following:    RBC 3.57 (*)    Hemoglobin 11.1 (*)    HCT 34.3 (*)    RDW 17.1 (*)    All other components within normal limits  COMPREHENSIVE METABOLIC PANEL - Abnormal; Notable for the following:    Potassium 5.2 (*)    Chloride 97 (*)    CO2 20 (*)    BUN 78 (*)    Creatinine, Ser 22.63 (*)    ALT 10 (*)    GFR calc non Af Amer 2 (*)    GFR calc Af Amer 2 (*)    Anion gap 18 (*)    All other components  within normal limits    Imaging Review No results found. I have personally reviewed and evaluated these images and lab results as part of my medical decision-making.   EKG Interpretation None      MDM   Final diagnoses:  Bilateral lower extremity edema    Filed Vitals:   09/02/15 1815 09/02/15 1830 09/02/15 1845 09/02/15 1900  BP: 126/86 127/79 119/81 135/84  Pulse: 84 87 94 83  Temp:      TempSrc:      Resp: 18 18    Height:      Weight:      SpO2: 100% 100% 100% 100%    Medications  ketorolac (TORADOL) 30 MG/ML injection 30 mg (30 mg Intravenous Given 09/02/15 1759)    Desiree [name] is 40 y.o. female presenting with Lower extremity pain and edema, states pain is so severe that she couldn't bring herself to go to dialysis 2 days ago. Venous duplex negative, potassium is not significantly elevated at 5.2. Lung sounds clear to auscultation, she saturating well on room air, no indication for emergent dialysis. Encouraged patient to go for her dialysis tomorrow and this will help with the edema. Patient verbalized her understanding.  This is a shared visit with the attending physician who personally evaluated the patient and agrees with the care plan.   Evaluation does not show pathology that would require ongoing emergent intervention or inpatient treatment. Pt is hemodynamically stable and mentating  appropriately. Discussed findings and plan with patient/guardian, who agrees with care plan. All questions answered. Return precautions discussed and outpatient follow up given.       Monico Blitz, PA-C 09/02/15 [place], MD 09/03/15 [phone]

## 2015-09-02 NOTE — Discharge Instructions (Signed)
Swelling on your feet should improve significantly if you go to dialysis as scheduled tomorrow.  Please follow with your primary care doctor in the next 2 days for a check-up. They must obtain records for further management.   Do not hesitate to return to the Emergency Department for any new, worsening or concerning symptoms.   Peripheral Edema You have swelling in your legs (peripheral edema). This swelling is due to excess accumulation of salt and water in your body. Edema may be a sign of heart, kidney or liver disease, or a side effect of a medication. It may also be due to problems in the leg veins. Elevating your legs and using special support stockings may be very helpful, if the cause of the swelling is due to poor venous circulation. Avoid long periods of standing, whatever the cause. Treatment of edema depends on identifying the cause. Chips, pretzels, pickles and other salty foods should be avoided. Restricting salt in your diet is almost always needed. Water pills (diuretics) are often used to remove the excess salt and water from your body via urine. These medicines prevent the kidney from reabsorbing sodium. This increases urine flow. Diuretic treatment may also result in lowering of potassium levels in your body. Potassium supplements may be needed if you have to use diuretics daily. Daily weights can help you keep track of your progress in clearing your edema. You should call your caregiver for follow up care as recommended. SEEK IMMEDIATE MEDICAL CARE IF:   You have increased swelling, pain, redness, or heat in your legs.  You develop shortness of breath, especially when lying down.  You develop chest or abdominal pain, weakness, or fainting.  You have a fever.   This information is not intended to replace advice given to you by your health care provider. Make sure you discuss any questions you have with your health care provider.   Document Released: 04/09/2004 Document  Revised: 05/25/2011 Document Reviewed: 09/12/2014 Elsevier Interactive Patient Education [employer].

## 2015-09-02 NOTE — Progress Notes (Signed)
VASCULAR LAB PRELIMINARY  PRELIMINARY  PRELIMINARY  PRELIMINARY  Bilateral lower extremity venous duplex completed.    Preliminary report:  There is no DVT or SVT noted in the bilateral lower extremities.   Karagan Lehr, RVT 09/02/2015, 5:12 PM

## 2015-09-02 NOTE — ED Notes (Signed)
Right leg started swelling last week-- has a knot on tib/fib area. Left leg swelling started 4 days ago-- is a dialysis pt, missed Saturday -- because of leg pain.

## 2015-09-02 NOTE — ED Provider Notes (Signed)
  Face-to-face evaluation   History: She complains of bilateral lower leg swelling, without known cause. The discomfort caused her to stay awake all night.   Physical exam: Alert, calm, cooperative. Bilateral lower extremities with mild edema. They're nontender to palpation.  Medical screening examination/treatment/procedure(s) were conducted as a shared visit with non-physician practitioner(s) and myself.  I personally evaluated the patient during the encounter  Daleen Bo, MD 09/03/15 [phone]

## 2015-10-25 ENCOUNTER — Encounter (HOSPITAL_COMMUNITY): Payer: Self-pay | Admitting: Emergency Medicine

## 2015-10-25 ENCOUNTER — Emergency Department (HOSPITAL_COMMUNITY)
Admission: EM | Admit: 2015-10-25 | Discharge: 2015-10-25 | Disposition: A | Discharge status: Home / Self Care | Payer: Medicare Other | Attending: Emergency Medicine | Admitting: Emergency Medicine

## 2015-10-25 DIAGNOSIS — Z992 Dependence on renal dialysis: Secondary | ICD-10-CM | POA: Insufficient documentation

## 2015-10-25 DIAGNOSIS — N186 End stage renal disease: Secondary | ICD-10-CM | POA: Insufficient documentation

## 2015-10-25 DIAGNOSIS — I120 Hypertensive chronic kidney disease with stage 5 chronic kidney disease or end stage renal disease: Secondary | ICD-10-CM | POA: Insufficient documentation

## 2015-10-25 DIAGNOSIS — J45909 Unspecified asthma, uncomplicated: Secondary | ICD-10-CM | POA: Insufficient documentation

## 2015-10-25 DIAGNOSIS — Z79899 Other long term (current) drug therapy: Secondary | ICD-10-CM | POA: Insufficient documentation

## 2015-10-25 DIAGNOSIS — Z7722 Contact with and (suspected) exposure to environmental tobacco smoke (acute) (chronic): Secondary | ICD-10-CM | POA: Insufficient documentation

## 2015-10-25 DIAGNOSIS — Z711 Person with feared health complaint in whom no diagnosis is made: Secondary | ICD-10-CM

## 2015-10-25 DIAGNOSIS — N898 Other specified noninflammatory disorders of vagina: Secondary | ICD-10-CM | POA: Diagnosis not present

## 2015-10-25 LAB — CBC WITH DIFFERENTIAL/PLATELET
BASOS PCT: 0 %
Basophils Absolute: 0 10*3/uL (ref 0.0–0.1)
EOS ABS: 0.2 10*3/uL (ref 0.0–0.7)
Eosinophils Relative: 3 %
HCT: 29.3 % — ABNORMAL LOW (ref 36.0–46.0)
HEMOGLOBIN: 9.7 g/dL — AB (ref 12.0–15.0)
LYMPHS ABS: 2.4 10*3/uL (ref 0.7–4.0)
Lymphocytes Relative: 30 %
MCH: 32.1 pg (ref 26.0–34.0)
MCHC: 33.1 g/dL (ref 30.0–36.0)
MCV: 97 fL (ref 78.0–100.0)
Monocytes Absolute: 0.5 10*3/uL (ref 0.1–1.0)
Monocytes Relative: 6 %
Neutro Abs: 4.8 10*3/uL (ref 1.7–7.7)
Neutrophils Relative %: 61 %
Platelets: 320 10*3/uL (ref 150–400)
RBC: 3.02 MIL/uL — AB (ref 3.87–5.11)
RDW: 18 % — ABNORMAL HIGH (ref 11.5–15.5)
WBC: 7.9 10*3/uL (ref 4.0–10.5)

## 2015-10-25 LAB — WET PREP, GENITAL
Sperm: NONE SEEN
Yeast Wet Prep HPF POC: NONE SEEN

## 2015-10-25 LAB — I-STAT CHEM 8, ED
BUN: 58 mg/dL — AB (ref 6–20)
CHLORIDE: 101 mmol/L (ref 101–111)
Calcium, Ion: 0.99 mmol/L — ABNORMAL LOW (ref 1.13–1.30)
Creatinine, Ser: 16.7 mg/dL — ABNORMAL HIGH (ref 0.44–1.00)
Glucose, Bld: 104 mg/dL — ABNORMAL HIGH (ref 65–99)
HEMATOCRIT: 39 % (ref 36.0–46.0)
Hemoglobin: 13.3 g/dL (ref 12.0–15.0)
Potassium: 3.5 mmol/L (ref 3.5–5.1)
SODIUM: 141 mmol/L (ref 135–145)
TCO2: 28 mmol/L (ref 0–100)

## 2015-10-25 LAB — I-STAT BETA HCG BLOOD, ED (MC, WL, AP ONLY): I-stat hCG, quantitative: <5 m[IU]/mL (ref <5)

## 2015-10-25 MED ORDER — STERILE WATER FOR INJECTION IJ SOLN
INTRAMUSCULAR | Status: AC
  Administered 2015-10-25: 09:00:00
  Filled 2015-10-25: qty 10

## 2015-10-25 MED ORDER — METRONIDAZOLE 500 MG PO TABS
2000.0000 mg | ORAL_TABLET | Freq: Once | ORAL | Status: AC
  Administered 2015-10-25: 2000 mg via ORAL
  Filled 2015-10-25: qty 4

## 2015-10-25 MED ORDER — OXYCODONE-ACETAMINOPHEN 5-325 MG PO TABS
1.0000 | ORAL_TABLET | Freq: Once | ORAL | Status: AC
  Administered 2015-10-25: 1 via ORAL
  Filled 2015-10-25: qty 1

## 2015-10-25 MED ORDER — TRAMADOL HCL 50 MG PO TABS: 50.0000 mg | ORAL_TABLET | Freq: Four times a day (QID) | ORAL | 0 refills | Status: DC | PRN

## 2015-10-25 MED ORDER — CEFTRIAXONE SODIUM 250 MG IJ SOLR
250.0000 mg | Freq: Once | INTRAMUSCULAR | Status: AC
  Administered 2015-10-25: 250 mg via INTRAMUSCULAR
  Filled 2015-10-25: qty 250

## 2015-10-25 MED ORDER — DOXYCYCLINE HYCLATE 100 MG PO CAPS: 100.0000 mg | ORAL_CAPSULE | Freq: Two times a day (BID) | ORAL | 0 refills | Status: DC

## 2015-10-25 NOTE — ED Provider Notes (Signed)
[facility] DEPT Provider Note   CSN: [id number] Arrival date & time: 10/25/15  0128  First Provider Contact:  First MD Initiated Contact with Patient 10/25/15 [phone]        History   Chief Complaint Chief Complaint  Patient presents with  . Vaginal Discharge    HPI Desiree [name] is a 40 y.o. female.  HPI Patient presents to the emergency department with Vaginal discharge and lower back pain.  The patient states that this started 2 days ago.  She states the vaginal discharge has a foul odor and is a whitish cream color.  She is concerned about STDs.  Patient states that nothing seems make the condition better or worse.  She states that she did not take any medications prior to arrival. The patient denies chest pain, shortness of breath, headache,blurred vision, neck pain, fever, cough, weakness, numbness, dizziness, anorexia, edema, abdominal pain, nausea, vomiting, diarrhea, rash,  dysuria, hematemesis, bloody stool, near syncope, or syncope. Past Medical History:  Diagnosis Date  . Asthma    as a child  . Blood transfusion   . Chronic kidney disease   . Dialysis patient ([place])   . GERD (gastroesophageal reflux disease)    nexium prn  . Headache(784.0)   . Hypertension   . Preterm labor   . Renal insufficiency     Patient Active Problem List   Diagnosis Date Noted  . Muscle cramps 03/29/2015  . Symptomatic anemia 09/15/2013  . Anemia of chronic kidney failure 09/15/2013  . Chest pain 09/08/2013  . Hyperkalemia 08/29/2013  . Abdominal pain 08/29/2013  . Nausea & vomiting 08/29/2013  . Cocaine abuse 08/26/2013  . Renal disease in pregnancy, antepartum 08/22/2013  . Rh negative, antepartum 08/22/2013  . Missed abortion 08/22/2013  . Depression 04/21/2012  . Acute blood loss anemia 08/03/2011  . ESRD (end stage renal disease) ([place]) 08/03/2011  . Hypokalemia 08/03/2011    Past Surgical History:  Procedure Laterality Date  . AV FISTULA PLACEMENT Left 2010  .  DILATION AND CURETTAGE OF UTERUS    . DILATION AND EVACUATION  08/02/2011   Procedure: DILATATION AND EVACUATION;  Surgeon: Logan Bores, MD;  Location: [facility];  Service: Gynecology;;  . Brigitte Pulse AND EVACUATION N/A 08/31/2013   Procedure: DILATATION AND EVACUATION;  Surgeon: Woodroe Mode, MD;  Location: [facility];  Service: Gynecology;  Laterality: N/A;  . RENAL BIOPSY    . REVISION OF ARTERIOVENOUS GORETEX GRAFT Left 02/11/2013   Procedure: REVISION OF ARTERIOVENOUS GORTEX FISTULA;  Surgeon: Rosetta Posner, MD;  Location: [facility];  Service: Vascular;  Laterality: Left;    OB History    Gravida Para Term Preterm AB Living   6 1 1  0 4 1   SAB TAB Ectopic Multiple Live Births   2 2 0 0 1       Home Medications    Prior to Admission medications   Medication Sig Start Date End Date Taking? Authorizing Provider  cinacalcet (SENSIPAR) 30 MG tablet Take 30 mg by mouth daily.   Yes Historical Provider, MD  cyclobenzaprine (FLEXERIL) 5 MG tablet Take 5 mg by mouth 3 (three) times daily as needed for muscle spasms.   Yes Historical Provider, MD  diphenhydrAMINE (BENADRYL) 25 MG tablet Take 50 mg by mouth daily.    Yes Historical Provider, MD  gabapentin (NEURONTIN) 100 MG capsule Take 300 mg by mouth 2 (two) times daily.    Yes Historical Provider, MD  Oxycodone HCl 10  MG TABS Take 10 mg by mouth daily.   Yes Historical Provider, MD  pantoprazole (PROTONIX) 40 MG tablet Take 40 mg by mouth daily.   Yes Historical Provider, MD  sevelamer carbonate (RENVELA) 2.4 G PACK Take 2.4 g by mouth 3 (three) times daily with meals.   Yes Historical Provider, MD  sodium bicarbonate 325 MG tablet Take 325 mg by mouth 2 (two) times daily.   Yes Historical Provider, MD  azithromycin (ZITHROMAX) 250 MG tablet Take 1 tablet (250 mg total) by mouth daily. Take first 2 tablets together, then 1 every day until finished. Patient not taking: Reported on 10/25/2015 03/29/15   Dorie Rank, MD  HYDROcodone-acetaminophen  (NORCO/VICODIN) 5-325 MG per tablet Take 1 tablet by mouth every 6 (six) hours as needed for moderate pain or severe pain. Patient not taking: Reported on 10/25/2015 01/28/14   Loma Sousa Forcucci, PA-C  methocarbamol (ROBAXIN) 500 MG tablet Take 1 tablet (500 mg total) by mouth 2 (two) times daily. Patient not taking: Reported on 10/25/2015 01/05/14   Jeannett Senior, PA-C  multivitamin (RENA-VIT) TABS tablet Take 1 tablet by mouth at bedtime. Patient not taking: Reported on 10/25/2015 09/15/13   Janece Canterbury, MD  oxyCODONE-acetaminophen (PERCOCET/ROXICET) 5-325 MG tablet Take 1 tablet by mouth every 4 (four) hours as needed for severe pain. Patient not taking: Reported on 10/25/2015 03/29/15   Dorie Rank, MD    Family History Family History  Problem Relation Age of Onset  . Hypertension    . Diabetes type II    . Diabetes Mother   . Hyperlipidemia Mother   . Hypertension Mother   . Heart disease Mother     Social History Social History  Substance Use Topics  . Smoking status: Passive Smoke Exposure - Never Smoker  . Smokeless tobacco: Never Used  . Alcohol use No     Allergies   Prednisone and Tuna [fish allergy]   Review of Systems Review of Systems All other systems negative except as documented in the HPI. All pertinent positives and negatives as reviewed in the HPI.  Physical Exam Updated Vital Signs BP 119/76 (BP Location: Right Arm)   Pulse 88   Temp 99.1 F (37.3 C) (Oral)   Resp 16   LMP 10/15/2015   SpO2 97%   Physical Exam  Constitutional: She is oriented to person, place, and time. She appears well-developed and well-nourished. No distress.  HENT:  Head: Normocephalic and atraumatic.  Mouth/Throat: Oropharynx is clear and moist.  Eyes: Pupils are equal, round, and reactive to light.  Neck: Normal range of motion. Neck supple.  Cardiovascular: Normal rate, regular rhythm and normal heart sounds.  Exam reveals no gallop and no friction rub.   No murmur  heard. Pulmonary/Chest: Effort normal and breath sounds normal. No respiratory distress. She has no wheezes.  Abdominal: Soft. Bowel sounds are normal. She exhibits no distension. There is no tenderness.  Genitourinary: Pelvic exam was performed with patient supine. There is no rash, tenderness or lesion on the right labia. There is no rash, tenderness or lesion on the left labia. Cervix exhibits discharge. Cervix exhibits no motion tenderness and no friability. Right adnexum displays no mass and no tenderness. Left adnexum displays no mass and no tenderness. No erythema, tenderness or bleeding in the vagina. No foreign body in the vagina. No signs of injury around the vagina. Vaginal discharge found.  Neurological: She is alert and oriented to person, place, and time. She exhibits normal muscle tone. Coordination normal.  Skin: Skin is warm and dry. No rash noted. No erythema.  Psychiatric: She has a normal mood and affect. Her behavior is normal.  Nursing note and vitals reviewed.    ED Treatments / Results  Labs (all labs ordered are listed, but only abnormal results are displayed) Labs Reviewed  WET PREP, GENITAL - Abnormal; Notable for the following:       Result Value   Trich, Wet Prep PRESENT (*)    Clue Cells Wet Prep HPF POC PRESENT (*)    WBC, Wet Prep HPF POC MANY (*)    All other components within normal limits  CBC WITH DIFFERENTIAL/PLATELET - Abnormal; Notable for the following:    RBC 3.02 (*)    Hemoglobin 9.7 (*)    HCT 29.3 (*)    RDW 18.0 (*)    All other components within normal limits  I-STAT CHEM 8, ED - Abnormal; Notable for the following:    BUN 58 (*)    Creatinine, Ser 16.70 (*)    Glucose, Bld 104 (*)    Calcium, Ion 0.99 (*)    All other components within normal limits  I-STAT BETA HCG BLOOD, ED ([facility], [facility], [facility] ONLY)  GC/CHLAMYDIA PROBE AMP ([place]) NOT AT [facility]    EKG  EKG Interpretation None       Radiology No results  found.  Procedures Procedures (including critical care time)  Medications Ordered in ED Medications  cefTRIAXone (ROCEPHIN) injection 250 mg (not administered)  metroNIDAZOLE (FLAGYL) tablet 2,000 mg (not administered)     Initial Impression / Assessment and Plan / ED Course  I have reviewed the triage vital signs and the nursing notes.  Pertinent labs & imaging results that were available during my care of the patient were reviewed by me and considered in my medical decision making (see chart for details).  Clinical Course    Patient be treated for STDs.  Told to return here as needed.  Advised follow-up with her primary doctor.  I will also treat for PID based on that she has back pain associated with this.  She does not have any fevers, no adnexal tenderness  Final Clinical Impressions(s) / ED Diagnoses   Final diagnoses:  None    New Prescriptions New Prescriptions   No medications on file     Dalia Heading, PA-C 10/25/15 0856    Shanon Rosser, MD 10/25/15 2248

## 2015-10-25 NOTE — Discharge Instructions (Signed)
Return here as needed.  Follow-up with your primary doctor, increase her fluid intake, Tylenol for any discomfort.

## 2015-10-25 NOTE — ED Notes (Signed)
Patient understood discharge instructions.  She is A & O x4. 

## 2015-10-25 NOTE — ED Triage Notes (Signed)
Pt has multiple complaints states she wants to be checked for STDs, has vaginal discharge. Pt also states she thinks something is wrong with her kidneys due to them bounding all day and states she is on dialysis. She also states today due to her kidney pain has been having difficulty walking, ambulating in triage w/o difficulty.

## 2015-10-28 LAB — GC/CHLAMYDIA PROBE AMP (~~LOC~~) NOT AT ARMC
Chlamydia: NEGATIVE
Neisseria Gonorrhea: NEGATIVE

## 2015-11-24 ENCOUNTER — Encounter (HOSPITAL_COMMUNITY): Payer: Self-pay | Admitting: Oncology

## 2015-11-24 ENCOUNTER — Emergency Department (HOSPITAL_COMMUNITY)
Admission: EM | Admit: 2015-11-24 | Discharge: 2015-11-24 | Disposition: A | Discharge status: Home / Self Care | Payer: Medicare Other | Attending: Emergency Medicine | Admitting: Emergency Medicine

## 2015-11-24 ENCOUNTER — Emergency Department (HOSPITAL_COMMUNITY): Payer: Medicare Other

## 2015-11-24 DIAGNOSIS — Y999 Unspecified external cause status: Secondary | ICD-10-CM | POA: Diagnosis not present

## 2015-11-24 DIAGNOSIS — W19XXXA Unspecified fall, initial encounter: Secondary | ICD-10-CM

## 2015-11-24 DIAGNOSIS — W108XXA Fall (on) (from) other stairs and steps, initial encounter: Secondary | ICD-10-CM | POA: Diagnosis not present

## 2015-11-24 DIAGNOSIS — N186 End stage renal disease: Secondary | ICD-10-CM | POA: Insufficient documentation

## 2015-11-24 DIAGNOSIS — Z79899 Other long term (current) drug therapy: Secondary | ICD-10-CM | POA: Diagnosis not present

## 2015-11-24 DIAGNOSIS — I120 Hypertensive chronic kidney disease with stage 5 chronic kidney disease or end stage renal disease: Secondary | ICD-10-CM | POA: Insufficient documentation

## 2015-11-24 DIAGNOSIS — Z992 Dependence on renal dialysis: Secondary | ICD-10-CM | POA: Diagnosis not present

## 2015-11-24 DIAGNOSIS — Y939 Activity, unspecified: Secondary | ICD-10-CM | POA: Diagnosis not present

## 2015-11-24 DIAGNOSIS — F1290 Cannabis use, unspecified, uncomplicated: Secondary | ICD-10-CM | POA: Insufficient documentation

## 2015-11-24 DIAGNOSIS — Z7722 Contact with and (suspected) exposure to environmental tobacco smoke (acute) (chronic): Secondary | ICD-10-CM | POA: Insufficient documentation

## 2015-11-24 DIAGNOSIS — J45909 Unspecified asthma, uncomplicated: Secondary | ICD-10-CM | POA: Insufficient documentation

## 2015-11-24 DIAGNOSIS — Y929 Unspecified place or not applicable: Secondary | ICD-10-CM | POA: Diagnosis not present

## 2015-11-24 DIAGNOSIS — M25552 Pain in left hip: Secondary | ICD-10-CM | POA: Diagnosis present

## 2015-11-24 LAB — I-STAT BETA HCG BLOOD, ED (MC, WL, AP ONLY)

## 2015-11-24 MED ORDER — ACETAMINOPHEN 325 MG PO TABS
650.0000 mg | ORAL_TABLET | Freq: Once | ORAL | Status: AC
  Administered 2015-11-24: 650 mg via ORAL
  Filled 2015-11-24: qty 2

## 2015-11-24 MED ORDER — TRAMADOL HCL 50 MG PO TABS
50.0000 mg | ORAL_TABLET | Freq: Four times a day (QID) | ORAL | 0 refills | Status: DC | PRN
Start: 2015-11-24 — End: 2016-01-21

## 2015-11-24 NOTE — ED Triage Notes (Signed)
Per pt she fell down 7 carpeted steps onto her left side.  Pt c/o left sided hip pain. Pt is ambulatory w/ normal gait in triage.  No obvious deformity noted.  Rates pain 10/10.

## 2015-11-24 NOTE — Discharge Instructions (Signed)
Your x-ray does not show any fracture. We recommend that you apply ice to areas of pain 3-4 times per day to limit swelling. Take Tylenol as needed for mild pain. You may take tramadol as prescribed for severe pain. Follow-up with your primary care doctor to ensure resolution of symptoms.

## 2015-11-24 NOTE — ED Provider Notes (Signed)
[facility] DEPT Provider Note   CSN: [id number] Arrival date & time: 11/24/15  0241     History   Chief Complaint Chief Complaint  Patient presents with  . Hip Pain    HPI Desiree [name] is a 40 y.o. female.  40 year old female with a history of chronic kidney disease, on T/Th/Sa dialysis, presents to the emergency department for evaluation of left hip pain. Patient states that she fell down 7 carpeted steps this evening. She denies head trauma or loss of consciousness. Patient states that her pain was not significant initially. She reports that it became worse this evening. She states that she had no medications at home to take. She has not had any difficulty walking prior to arrival. No extremity numbness or weakness.     Past Medical History:  Diagnosis Date  . Asthma    as a child  . Blood transfusion   . Chronic kidney disease   . Dialysis patient ([place])   . GERD (gastroesophageal reflux disease)    nexium prn  . Headache(784.0)   . Hypertension   . Preterm labor   . Renal insufficiency     Patient Active Problem List   Diagnosis Date Noted  . Muscle cramps 03/29/2015  . Symptomatic anemia 09/15/2013  . Anemia of chronic kidney failure 09/15/2013  . Chest pain 09/08/2013  . Hyperkalemia 08/29/2013  . Abdominal pain 08/29/2013  . Nausea & vomiting 08/29/2013  . Cocaine abuse 08/26/2013  . Renal disease in pregnancy, antepartum 08/22/2013  . Rh negative, antepartum 08/22/2013  . Missed abortion 08/22/2013  . Depression 04/21/2012  . Acute blood loss anemia 08/03/2011  . ESRD (end stage renal disease) ([place]) 08/03/2011  . Hypokalemia 08/03/2011    Past Surgical History:  Procedure Laterality Date  . AV FISTULA PLACEMENT Left 2010  . DILATION AND CURETTAGE OF UTERUS    . DILATION AND EVACUATION  08/02/2011   Procedure: DILATATION AND EVACUATION;  Surgeon: Logan Bores, MD;  Location: [facility];  Service: Gynecology;;  . Brigitte Pulse AND EVACUATION N/A  08/31/2013   Procedure: DILATATION AND EVACUATION;  Surgeon: Woodroe Mode, MD;  Location: [facility];  Service: Gynecology;  Laterality: N/A;  . RENAL BIOPSY    . REVISION OF ARTERIOVENOUS GORETEX GRAFT Left 02/11/2013   Procedure: REVISION OF ARTERIOVENOUS GORTEX FISTULA;  Surgeon: Rosetta Posner, MD;  Location: [facility];  Service: Vascular;  Laterality: Left;    OB History    Gravida Para Term Preterm AB Living   6 1 1  0 4 1   SAB TAB Ectopic Multiple Live Births   2 2 0 0 1       Home Medications    Prior to Admission medications   Medication Sig Start Date End Date Taking? Authorizing Provider  azithromycin (ZITHROMAX) 250 MG tablet Take 1 tablet (250 mg total) by mouth daily. Take first 2 tablets together, then 1 every day until finished. Patient not taking: Reported on 10/25/2015 03/29/15   Dorie Rank, MD  cinacalcet (SENSIPAR) 30 MG tablet Take 30 mg by mouth daily.    Historical Provider, MD  cyclobenzaprine (FLEXERIL) 5 MG tablet Take 5 mg by mouth 3 (three) times daily as needed for muscle spasms.    Historical Provider, MD  diphenhydrAMINE (BENADRYL) 25 MG tablet Take 50 mg by mouth daily.     Historical Provider, MD  doxycycline (VIBRAMYCIN) 100 MG capsule Take 1 capsule (100 mg total) by mouth 2 (two) times daily. 10/25/15  Christopher Lawyer, PA-C  gabapentin (NEURONTIN) 100 MG capsule Take 300 mg by mouth 2 (two) times daily.     Historical Provider, MD  methocarbamol (ROBAXIN) 500 MG tablet Take 1 tablet (500 mg total) by mouth 2 (two) times daily. Patient not taking: Reported on 10/25/2015 01/05/14   Jeannett Senior, PA-C  multivitamin (RENA-VIT) TABS tablet Take 1 tablet by mouth at bedtime. Patient not taking: Reported on 10/25/2015 09/15/13   Janece Canterbury, MD  Oxycodone HCl 10 MG TABS Take 10 mg by mouth daily.    Historical Provider, MD  pantoprazole (PROTONIX) 40 MG tablet Take 40 mg by mouth daily.    Historical Provider, MD  sevelamer carbonate (RENVELA) 2.4 G PACK  Take 2.4 g by mouth 3 (three) times daily with meals.    Historical Provider, MD  sodium bicarbonate 325 MG tablet Take 325 mg by mouth 2 (two) times daily.    Historical Provider, MD  traMADol (ULTRAM) 50 MG tablet Take 1 tablet (50 mg total) by mouth every 6 (six) hours as needed for severe pain. 11/24/15   Antonietta Breach, PA-C    Family History Family History  Problem Relation Age of Onset  . Diabetes Mother   . Hyperlipidemia Mother   . Hypertension Mother   . Heart disease Mother   . Hypertension    . Diabetes type II      Social History Social History  Substance Use Topics  . Smoking status: Passive Smoke Exposure - Never Smoker  . Smokeless tobacco: Never Used  . Alcohol use No     Allergies   Prednisone and Blain Pais allergy]   Review of Systems Review of Systems  Musculoskeletal: Positive for arthralgias and myalgias.  Neurological: Negative for weakness and numbness.    Physical Exam Updated Vital Signs BP 135/78   Pulse 77   Temp 98.4 F (36.9 C) (Oral)   Resp 18   Ht 5\' 5"  (1.651 m)   Wt 89.5 kg   LMP 11/15/2015 (Exact Date)   SpO2 100%   BMI 32.83 kg/m   Physical Exam  Constitutional: She is oriented to person, place, and time. She appears well-developed and well-nourished. No distress.  HENT:  Head: Normocephalic and atraumatic.  Eyes: Conjunctivae and EOM are normal. No scleral icterus.  Neck: Normal range of motion.  Cardiovascular: Normal rate, regular rhythm and intact distal pulses.   DP pulses 2+ in the left lower extremity.  Pulmonary/Chest: Effort normal. No respiratory distress.  Musculoskeletal: Normal range of motion.       Legs: Normal range of motion of the left lower extremity. No leg shortening or malrotation. No bony deformity or crepitus.  Neurological: She is alert and oriented to person, place, and time.  Sensation to light touch intact in all extremities. Patient ambulatory with steady gait.  Skin: Skin is warm and dry. No  rash noted. She is not diaphoretic. No erythema. No pallor.  Psychiatric: She has a normal mood and affect. Her behavior is normal.  Nursing note and vitals reviewed.    ED Treatments / Results  Labs (all labs ordered are listed, but only abnormal results are displayed) Labs Reviewed  I-STAT BETA HCG BLOOD, ED ([facility], [facility], [facility] ONLY)    EKG  EKG Interpretation None       Radiology Dg Hip Unilat W Or Wo Pelvis 2-3 Views Left  Result Date: 11/24/2015 CLINICAL DATA:  Status post fall down seven steps, with left-sided hip pain. Initial encounter. EXAM: DG  HIP (WITH OR WITHOUT PELVIS) 2-3V LEFT COMPARISON:  None. FINDINGS: There is no evidence of fracture or dislocation. Both femoral heads are seated normally within their respective acetabula. The proximal left femur appears intact. No significant degenerative change is appreciated. The sacroiliac joints are unremarkable in appearance. The visualized bowel gas pattern is grossly unremarkable in appearance. IMPRESSION: No evidence of fracture or dislocation. Electronically Signed   By: Garald Balding M.D.   On: 11/24/2015 04:03    Procedures Procedures (including critical care time)  Medications Ordered in ED Medications  acetaminophen (TYLENOL) tablet 650 mg (650 mg Oral Given 11/24/15 0356)     Initial Impression / Assessment and Plan / ED Course  I have reviewed the triage vital signs and the nursing notes.  Pertinent labs & imaging results that were available during my care of the patient were reviewed by me and considered in my medical decision making (see chart for details).  Clinical Course    40 year old female presents to the emergency department for evaluation of left hip pain secondary to a mechanical fall. Patient with no head trauma or loss of consciousness during her fall. She is neurovascularly intact and ambulatory without difficulty. X-ray negative for any pelvic fracture. I do not believe further emergent workup or  imaging is indicated. The patient has been referred to her primary care doctor for follow-up. Return precautions given at discharge. Patient discharged in stable condition.   Vitals:   11/24/15 0252 11/24/15 0254 11/24/15 0411  BP: 124/88  135/78  Pulse: 86  77  Resp: 12  18  Temp: 98.4 F (36.9 C)    TempSrc: Oral    SpO2: 100%  100%  Weight:  89.5 kg   Height:  5\' 5"  (1.651 m)     Final Clinical Impressions(s) / ED Diagnoses   Final diagnoses:  Left hip pain  Fall, initial encounter    New Prescriptions Discharge Medication List as of 11/24/2015  4:10 AM       Antonietta Breach, PA-C 11/24/15 0424    Orpah Greek, MD 11/24/15 [phone]

## 2016-01-20 ENCOUNTER — Encounter (HOSPITAL_COMMUNITY): Payer: Self-pay | Admitting: Emergency Medicine

## 2016-01-20 DIAGNOSIS — M549 Dorsalgia, unspecified: Secondary | ICD-10-CM | POA: Diagnosis present

## 2016-01-20 DIAGNOSIS — M79632 Pain in left forearm: Secondary | ICD-10-CM | POA: Diagnosis not present

## 2016-01-20 DIAGNOSIS — T82868A Thrombosis of vascular prosthetic devices, implants and grafts, initial encounter: Principal | ICD-10-CM | POA: Diagnosis present

## 2016-01-20 DIAGNOSIS — R7881 Bacteremia: Secondary | ICD-10-CM | POA: Diagnosis present

## 2016-01-20 DIAGNOSIS — Z79899 Other long term (current) drug therapy: Secondary | ICD-10-CM

## 2016-01-20 DIAGNOSIS — Z7722 Contact with and (suspected) exposure to environmental tobacco smoke (acute) (chronic): Secondary | ICD-10-CM | POA: Diagnosis present

## 2016-01-20 DIAGNOSIS — K219 Gastro-esophageal reflux disease without esophagitis: Secondary | ICD-10-CM | POA: Diagnosis present

## 2016-01-20 DIAGNOSIS — Z833 Family history of diabetes mellitus: Secondary | ICD-10-CM

## 2016-01-20 DIAGNOSIS — Z8249 Family history of ischemic heart disease and other diseases of the circulatory system: Secondary | ICD-10-CM

## 2016-01-20 DIAGNOSIS — N2581 Secondary hyperparathyroidism of renal origin: Secondary | ICD-10-CM | POA: Diagnosis present

## 2016-01-20 DIAGNOSIS — Z992 Dependence on renal dialysis: Secondary | ICD-10-CM

## 2016-01-20 DIAGNOSIS — Z9115 Patient's noncompliance with renal dialysis: Secondary | ICD-10-CM

## 2016-01-20 DIAGNOSIS — D631 Anemia in chronic kidney disease: Secondary | ICD-10-CM | POA: Diagnosis present

## 2016-01-20 DIAGNOSIS — Y832 Surgical operation with anastomosis, bypass or graft as the cause of abnormal reaction of the patient, or of later complication, without mention of misadventure at the time of the procedure: Secondary | ICD-10-CM | POA: Diagnosis present

## 2016-01-20 DIAGNOSIS — I120 Hypertensive chronic kidney disease with stage 5 chronic kidney disease or end stage renal disease: Secondary | ICD-10-CM | POA: Diagnosis present

## 2016-01-20 DIAGNOSIS — N186 End stage renal disease: Secondary | ICD-10-CM | POA: Diagnosis present

## 2016-01-20 DIAGNOSIS — M898X9 Other specified disorders of bone, unspecified site: Secondary | ICD-10-CM | POA: Diagnosis present

## 2016-01-20 DIAGNOSIS — G8929 Other chronic pain: Secondary | ICD-10-CM | POA: Diagnosis present

## 2016-01-20 DIAGNOSIS — B957 Other staphylococcus as the cause of diseases classified elsewhere: Secondary | ICD-10-CM | POA: Diagnosis present

## 2016-01-20 NOTE — ED Triage Notes (Signed)
Pt presents to ED for assessment of headache x 3 weeks.  Denies any relief from home remedies.  Sts high bp at home.  Pt also c.o pain to her left forearm where she has a dialysis fistula.  Thrill and bruit noted.  Pt c/o pain to the left hand, increasing with palpation.  Area warm to the touch.  Pt c/o sensitivity to light and some intermittent blurred vision with HA.

## 2016-01-21 ENCOUNTER — Observation Stay (HOSPITAL_BASED_OUTPATIENT_CLINIC_OR_DEPARTMENT_OTHER): Payer: Medicare Other

## 2016-01-21 ENCOUNTER — Inpatient Hospital Stay (HOSPITAL_COMMUNITY)
Admission: EM | Admit: 2016-01-21 | Discharge: 2016-01-23 | DRG: 314 | Disposition: A | Discharge status: Home / Self Care | Payer: Medicare Other | Attending: Internal Medicine | Admitting: Internal Medicine

## 2016-01-21 ENCOUNTER — Encounter (HOSPITAL_COMMUNITY): Payer: Self-pay | Admitting: Family Medicine

## 2016-01-21 DIAGNOSIS — D631 Anemia in chronic kidney disease: Secondary | ICD-10-CM | POA: Diagnosis present

## 2016-01-21 DIAGNOSIS — G8929 Other chronic pain: Secondary | ICD-10-CM | POA: Diagnosis present

## 2016-01-21 DIAGNOSIS — M79602 Pain in left arm: Secondary | ICD-10-CM | POA: Diagnosis not present

## 2016-01-21 DIAGNOSIS — N189 Chronic kidney disease, unspecified: Secondary | ICD-10-CM

## 2016-01-21 DIAGNOSIS — N185 Chronic kidney disease, stage 5: Secondary | ICD-10-CM

## 2016-01-21 DIAGNOSIS — N186 End stage renal disease: Secondary | ICD-10-CM | POA: Diagnosis present

## 2016-01-21 DIAGNOSIS — G44009 Cluster headache syndrome, unspecified, not intractable: Secondary | ICD-10-CM

## 2016-01-21 DIAGNOSIS — M79609 Pain in unspecified limb: Secondary | ICD-10-CM

## 2016-01-21 DIAGNOSIS — L03114 Cellulitis of left upper limb: Secondary | ICD-10-CM

## 2016-01-21 DIAGNOSIS — T82898A Other specified complication of vascular prosthetic devices, implants and grafts, initial encounter: Secondary | ICD-10-CM | POA: Diagnosis not present

## 2016-01-21 DIAGNOSIS — I10 Essential (primary) hypertension: Secondary | ICD-10-CM

## 2016-01-21 HISTORY — DX: Adverse effect of unspecified anesthetic, initial encounter: T41.45XA

## 2016-01-21 HISTORY — DX: Unspecified asthma, uncomplicated: J45.909

## 2016-01-21 HISTORY — DX: Dependence on renal dialysis: Z99.2

## 2016-01-21 HISTORY — DX: Other complications of anesthesia, initial encounter: T88.59XA

## 2016-01-21 HISTORY — DX: End stage renal disease: N18.6

## 2016-01-21 HISTORY — DX: Gout, unspecified: M10.9

## 2016-01-21 HISTORY — DX: Unspecified convulsions: R56.9

## 2016-01-21 HISTORY — DX: Migraine, unspecified, not intractable, without status migrainosus: G43.909

## 2016-01-21 HISTORY — DX: Personal history of other medical treatment: Z92.89

## 2016-01-21 LAB — BASIC METABOLIC PANEL
Anion gap: 19 — ABNORMAL HIGH (ref 5–15)
BUN: 71 mg/dL — AB (ref 6–20)
CALCIUM: 9.9 mg/dL (ref 8.9–10.3)
CO2: 23 mmol/L (ref 22–32)
CREATININE: 19.63 mg/dL — AB (ref 0.44–1.00)
Chloride: 95 mmol/L — ABNORMAL LOW (ref 101–111)
GFR calc Af Amer: 2 mL/min — ABNORMAL LOW (ref >60)
GFR, EST NON AFRICAN AMERICAN: 2 mL/min — AB (ref >60)
Glucose, Bld: 87 mg/dL (ref 65–99)
Potassium: 4.9 mmol/L (ref 3.5–5.1)
Sodium: 137 mmol/L (ref 135–145)

## 2016-01-21 LAB — CBC WITH DIFFERENTIAL/PLATELET
Basophils Absolute: 0 10*3/uL (ref 0.0–0.1)
Basophils Relative: 0 %
EOS ABS: 0.3 10*3/uL (ref 0.0–0.7)
EOS PCT: 4 %
HCT: 36.1 % (ref 36.0–46.0)
Hemoglobin: 11.5 g/dL — ABNORMAL LOW (ref 12.0–15.0)
LYMPHS ABS: 3.3 10*3/uL (ref 0.7–4.0)
Lymphocytes Relative: 46 %
MCH: 29.3 pg (ref 26.0–34.0)
MCHC: 31.9 g/dL (ref 30.0–36.0)
MCV: 92.1 fL (ref 78.0–100.0)
MONOS PCT: 11 %
Monocytes Absolute: 0.8 10*3/uL (ref 0.1–1.0)
Neutro Abs: 2.8 10*3/uL (ref 1.7–7.7)
Neutrophils Relative %: 39 %
PLATELETS: 197 10*3/uL (ref 150–400)
RBC: 3.92 MIL/uL (ref 3.87–5.11)
RDW: 16.7 % — ABNORMAL HIGH (ref 11.5–15.5)
WBC: 7.2 10*3/uL (ref 4.0–10.5)

## 2016-01-21 LAB — BLOOD CULTURE ID PANEL (REFLEXED)
Acinetobacter baumannii: NOT DETECTED
CANDIDA GLABRATA: NOT DETECTED
CANDIDA TROPICALIS: NOT DETECTED
Candida albicans: NOT DETECTED
Candida krusei: NOT DETECTED
Candida parapsilosis: NOT DETECTED
ENTEROBACTER CLOACAE COMPLEX: NOT DETECTED
Enterobacteriaceae species: NOT DETECTED
Enterococcus species: NOT DETECTED
Escherichia coli: NOT DETECTED
Haemophilus influenzae: NOT DETECTED
KLEBSIELLA PNEUMONIAE: NOT DETECTED
Klebsiella oxytoca: NOT DETECTED
Listeria monocytogenes: NOT DETECTED
Methicillin resistance: NOT DETECTED
NEISSERIA MENINGITIDIS: NOT DETECTED
PROTEUS SPECIES: NOT DETECTED
Pseudomonas aeruginosa: NOT DETECTED
STAPHYLOCOCCUS SPECIES: DETECTED — AB
STREPTOCOCCUS SPECIES: NOT DETECTED
Serratia marcescens: NOT DETECTED
Staphylococcus aureus (BCID): NOT DETECTED
Streptococcus agalactiae: NOT DETECTED
Streptococcus pneumoniae: NOT DETECTED
Streptococcus pyogenes: NOT DETECTED

## 2016-01-21 LAB — I-STAT CG4 LACTIC ACID, ED: LACTIC ACID, VENOUS: 0.96 mmol/L (ref 0.5–1.9)

## 2016-01-21 MED ORDER — OXYCODONE HCL 5 MG PO TABS
5.0000 mg | ORAL_TABLET | ORAL | Status: DC | PRN
  Administered 2016-01-21 – 2016-01-23 (×10): 5 mg via ORAL
  Filled 2016-01-21 (×9): qty 1

## 2016-01-21 MED ORDER — CINACALCET HCL 30 MG PO TABS
30.0000 mg | ORAL_TABLET | Freq: Every day | ORAL | Status: DC
  Administered 2016-01-21 – 2016-01-23 (×3): 30 mg via ORAL
  Filled 2016-01-21 (×4): qty 1

## 2016-01-21 MED ORDER — ONDANSETRON HCL 4 MG PO TABS
4.0000 mg | ORAL_TABLET | Freq: Four times a day (QID) | ORAL | Status: DC | PRN
  Administered 2016-01-21 – 2016-01-22 (×3): 4 mg via ORAL
  Filled 2016-01-21 (×3): qty 1

## 2016-01-21 MED ORDER — PANTOPRAZOLE SODIUM 40 MG PO TBEC
40.0000 mg | DELAYED_RELEASE_TABLET | Freq: Every day | ORAL | Status: DC
Start: 2016-01-21 — End: 2016-01-23
  Administered 2016-01-21 – 2016-01-23 (×3): 40 mg via ORAL
  Filled 2016-01-21 (×3): qty 1

## 2016-01-21 MED ORDER — SEVELAMER CARBONATE 800 MG PO TABS
2400.0000 mg | ORAL_TABLET | Freq: Three times a day (TID) | ORAL | Status: DC
  Administered 2016-01-21 – 2016-01-23 (×6): 2400 mg via ORAL
  Filled 2016-01-21 (×6): qty 3

## 2016-01-21 MED ORDER — MORPHINE SULFATE (PF) 4 MG/ML IV SOLN: 4.0000 mg | INTRAVENOUS | Status: DC | PRN

## 2016-01-21 MED ORDER — VANCOMYCIN HCL IN DEXTROSE 1-5 GM/200ML-% IV SOLN
1000.0000 mg | Freq: Once | INTRAVENOUS | Status: DC
  Administered 2016-01-21: 1000 mg via INTRAVENOUS
  Filled 2016-01-21: qty 200

## 2016-01-21 MED ORDER — SENNOSIDES-DOCUSATE SODIUM 8.6-50 MG PO TABS: 1.0000 | ORAL_TABLET | Freq: Every evening | ORAL | Status: DC | PRN

## 2016-01-21 MED ORDER — HEPARIN SODIUM (PORCINE) 5000 UNIT/ML IJ SOLN
5000.0000 [IU] | Freq: Three times a day (TID) | INTRAMUSCULAR | Status: DC
  Administered 2016-01-21 – 2016-01-23 (×6): 5000 [IU] via SUBCUTANEOUS
  Filled 2016-01-21 (×6): qty 1

## 2016-01-21 MED ORDER — ACETAMINOPHEN 650 MG RE SUPP: 650.0000 mg | Freq: Four times a day (QID) | RECTAL | Status: DC | PRN

## 2016-01-21 MED ORDER — SODIUM BICARBONATE 650 MG PO TABS
325.0000 mg | ORAL_TABLET | Freq: Two times a day (BID) | ORAL | Status: DC
  Administered 2016-01-21 – 2016-01-23 (×5): 325 mg via ORAL
  Filled 2016-01-21 (×5): qty 1

## 2016-01-21 MED ORDER — NEPRO/CARBSTEADY PO LIQD
237.0000 mL | Freq: Two times a day (BID) | ORAL | Status: DC
  Administered 2016-01-22: 237 mL via ORAL

## 2016-01-21 MED ORDER — MORPHINE SULFATE (PF) 4 MG/ML IV SOLN
4.0000 mg | Freq: Once | INTRAVENOUS | Status: DC
  Filled 2016-01-21: qty 1

## 2016-01-21 MED ORDER — OXYCODONE-ACETAMINOPHEN 5-325 MG PO TABS
1.0000 | ORAL_TABLET | ORAL | Status: DC | PRN
  Administered 2016-01-21 – 2016-01-23 (×8): 1 via ORAL
  Filled 2016-01-21 (×8): qty 1

## 2016-01-21 MED ORDER — VANCOMYCIN HCL IN DEXTROSE 1-5 GM/200ML-% IV SOLN
1000.0000 mg | Freq: Once | INTRAVENOUS | Status: AC
  Administered 2016-01-22: 1000 mg via INTRAVENOUS
  Filled 2016-01-21: qty 200

## 2016-01-21 MED ORDER — ACETAMINOPHEN 325 MG PO TABS
650.0000 mg | ORAL_TABLET | Freq: Four times a day (QID) | ORAL | Status: DC | PRN
Start: 2016-01-21 — End: 2016-01-23

## 2016-01-21 MED ORDER — MORPHINE SULFATE (PF) 4 MG/ML IV SOLN
4.0000 mg | INTRAVENOUS | Status: AC | PRN
  Administered 2016-01-21 (×3): 4 mg via INTRAVENOUS
  Filled 2016-01-21 (×3): qty 1

## 2016-01-21 MED ORDER — ONDANSETRON HCL 4 MG/2ML IJ SOLN
4.0000 mg | Freq: Four times a day (QID) | INTRAMUSCULAR | Status: DC | PRN
  Administered 2016-01-22 – 2016-01-23 (×2): 4 mg via INTRAVENOUS
  Filled 2016-01-21 (×3): qty 2

## 2016-01-21 MED ORDER — GABAPENTIN 300 MG PO CAPS
300.0000 mg | ORAL_CAPSULE | Freq: Two times a day (BID) | ORAL | Status: DC
Start: 2016-01-21 — End: 2016-01-23
  Administered 2016-01-21 – 2016-01-23 (×5): 300 mg via ORAL
  Filled 2016-01-21 (×5): qty 1

## 2016-01-21 NOTE — Progress Notes (Signed)
At shift report, informed pt. And pt. Husband that she was unable to leave the floor, if she wanted something to eat her husband, her had to go get, with the oncoming RN Santiago Glad as a witness.  If she left the floor again, she would have to sign the AMA paper.  Alphonzo Lemmings, RN

## 2016-01-21 NOTE — Progress Notes (Signed)
Patient does not have IV pain medication ordered at this time. I paged the doctor. Awaiting call back.

## 2016-01-21 NOTE — Progress Notes (Signed)
Pt. Back up to the floor and requesting pain medication.  Went in pt. Room and informed her that Dr. Eliseo Squires had told her earlier today that she couldn't leave the floor.  Pt. Stated that MD told her it was ok to leave the floor and go get something to eat, just as long as the doctors wasn't rounding.  Informed pt. Again that I just spoke with your doctor and she did not want her to leave the floor.  If she did again, she would had to sign AMA papers.  Will continue to follow.  Alphonzo Lemmings, RN

## 2016-01-21 NOTE — Progress Notes (Addendum)
Patient admitted after midnight, please see H&P.  Vascular called for consult as well as renal-- HD t/th/sat.  U/S done.  Patient c/o fever-- vanc x 1 dose in ER-- BC pending  Eulogio Bear DO

## 2016-01-21 NOTE — Significant Event (Signed)
Patient had left her room after returning from vascular ultrasound. This RN found her near elevators, talking with charge RN and was about to leave unit. Patient stated she is leaving unit with her significant other to get food and will return to unit. Patient did not listen to staff, even after speaking with charge RN to stay in unit. This RN notified Dr. Eliseo Squires who stated that patient should not be leaving unit. Will reinforce this when patient returns.

## 2016-01-21 NOTE — Progress Notes (Signed)
*  PRELIMINARY RESULTS* Vascular Ultrasound Duplex Dialysis Access (AVF, AGV) has been completed.   The left radio-cephalic arteriovenous fistula is patent. 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 This area exhibits elevated velocities >780/350 cm/s. Cephalic vein (outflow) is patent in the upper arm.  Preliminary results discussed with Dr. Eliseo Squires.  01/21/2016 10:00 AM Maudry Mayhew, BS, RVT, RDCS, RDMS

## 2016-01-21 NOTE — ED Notes (Signed)
Returned one (1) 4mg  vial of morphine to pharmacy that had been drawn from ED Pyxis for this pt's use before order was discontinued.

## 2016-01-21 NOTE — ED Notes (Signed)
Attempted IV start without success. Ordering IV Team consult.

## 2016-01-21 NOTE — Progress Notes (Signed)
Received report from [place], RN on patient's status.  She is to have HD 01/22/16.  Delcine informed the patient she was not to leave the floor and if she does leave the floor, she will have to sign AMA.

## 2016-01-21 NOTE — Consult Note (Signed)
Patient name: Desiree [name] MRN: [id number] DOB: [birth year] Sex: female  REASON FOR VISIT: Pain in left radial-cephalic AVF.   HPI: Desiree [name] is a 40 y.o. female who dialyzes on Tuesdays Thursdays and Saturdays. She last dialyzed Saturday. She sticks the fistula in the upper part of her forearm not distally. She developed pain in her left wrist last night and was admitted and has not had dialysis. She experiences pain at the wrist which is worse when the area is palpated. She denies fever or chills. This area has been swollen but seems a little more swollen than normal.  In reviewing the records, it looks like we have been following her fistula since 2010 to this fistula has been intermittent for many years. Most recently, on 02/11/2013, she had revision of her left forearm AV fistula because of bleeding. 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 She had excision of the eroded nonviable skin and resection of the anterior wall of the fistula with primary closure.   Current Facility-Administered Medications  Medication Dose Route Frequency Provider Last Rate Last Dose  . acetaminophen (TYLENOL) tablet 650 mg  650 mg Oral Q6H PRN Edwin Dada, MD       Or  . acetaminophen (TYLENOL) suppository 650 mg  650 mg Rectal Q6H PRN Edwin Dada, MD      . cinacalcet (SENSIPAR) tablet 30 mg  30 mg Oral Q breakfast Edwin Dada, MD   30 mg at 01/21/16 1013  . gabapentin (NEURONTIN) capsule 300 mg  300 mg Oral BID Edwin Dada, MD   300 mg at 01/21/16 0942  . heparin injection 5,000 Units  5,000 Units Subcutaneous Q8H Edwin Dada, MD   5,000 Units at 01/21/16 1325  . morphine 4 MG/ML injection 4 mg  4 mg Intravenous Q4H PRN Edwin Dada, MD   4 mg at 01/21/16 1137  . ondansetron (ZOFRAN) tablet 4 mg  4 mg Oral Q6H PRN Edwin Dada, MD       Or  . ondansetron (ZOFRAN) injection 4 mg  4 mg Intravenous Q6H PRN Edwin Dada, MD      .  oxyCODONE-acetaminophen (PERCOCET/ROXICET) 5-325 MG per tablet 1 tablet  1 tablet Oral Q4H PRN Edwin Dada, MD   1 tablet at 01/21/16 0529   And  . oxyCODONE (Oxy IR/ROXICODONE) immediate release tablet 5 mg  5 mg Oral Q4H PRN Edwin Dada, MD   5 mg at 01/21/16 1020  . pantoprazole (PROTONIX) EC tablet 40 mg  40 mg Oral Daily Edwin Dada, MD   40 mg at 01/21/16 0943  . senna-docusate (Senokot-S) tablet 1 tablet  1 tablet Oral QHS PRN Edwin Dada, MD      . sevelamer carbonate (RENVELA) tablet 2,400 mg  2,400 mg Oral TID WC Edwin Dada, MD   2,400 mg at 01/21/16 1323  . sodium bicarbonate tablet 325 mg  325 mg Oral BID Edwin Dada, MD   325 mg at 01/21/16 0943    REVIEW OF SYSTEMS:  [X]  denotes positive finding, [ ]  denotes negative finding Cardiac  Comments:  Chest pain or chest pressure:    Shortness of breath upon exertion:    Short of breath when lying flat:    Irregular heart rhythm:    Constitutional    Fever or chills:      PHYSICAL EXAM: Vitals:   01/21/16 0515 01/21/16 5726 01/21/16 1042 01/21/16 1509  BP: (!) 143/102 (!) 168/97 125/72 (!) 149/79  Pulse: 85 91 92 90  Resp: 14 18 18    Temp: 98.5 F (36.9 C) 98.7 F (37.1 C) 98.2 F (36.8 C) 98.5 F (36.9 C)  TempSrc: Oral Oral Oral Oral  SpO2: 98% 100% 96% 92%  Weight:      Height:        GENERAL: The patient is a well-nourished female, in no acute distress. The vital signs are documented above. CARDIOVASCULAR: There is a regular rate and rhythm. PULMONARY: There is good air exchange bilaterally without wheezing or rales. Her left radiocephalic fistula has an excellent thrill and bruit. The proximal fistula just above the wrist is swollen. There is no compromise of the skin and no erythema to suggest infection. When I compress the fistula above the aneurysmal area she also has significant tenderness.  DUPLEX LEFT RADIOCEPHALIC AV FISTULA: Duplex of her left  radiocephalic AV fistula shows an aneurysmal area in the fistula just above the wrist measuring 1.7 cm. There is some thrombus within the aneurysm. There are also elevated velocities in the proximal fistula. The fistula above this is widely patent.  MEDICAL ISSUES:  PAINFUL LEFT RADIOCEPHALIC AV FISTULA:  The etiology of her tenderness in her fistula is not clear to me. There is no induration to suggest phlebitis. He does not appear to be any significant sudden change in the proximal fistula which has been chronically swollen. She does not cannulate the fistula in this area. I think there is adequate room to stick the fistula above the area of concern and continue her dialysis. I'm reluctant to consider a fistulogram given that in order to evaluate the proximal fistula we would have to compress the fistula above the cannulation site and this causes her significant tenderness and she cannot hold still. I'm also reluctant to consider simply exploring the area without more information. I think would be best taken to continue to use the fistula by cannulating above the area of concern and following this closely.  [name] [facility] [phone]

## 2016-01-21 NOTE — ED Notes (Signed)
Unable to return vial of morphine from ED. Wasted vial in [facility] med room sharps container. Witnessed by Denyse Amass, Therapist, sports.

## 2016-01-21 NOTE — Care Management Note (Signed)
Case Management Note  Patient Details  Name: Desiree [name] MRN: [id number] Date of Birth: 1976-02-10  Subjective/Objective:                 Independent patient from home admitted with pain to arm of fistula. HD TTS at [facility]. Patient lives with boyfriend and 39 year old daughter. Patient drives and also gets rides from family if not feeling well. Denies difficulties paying for meds. No CM needs identified at this time.    Action/Plan:  CM will continue to follow for DC planning.  Expected Discharge Date:                  Expected Discharge Plan:  Home/Self Care  In-House Referral:     Discharge planning Services  CM Consult  Post Acute Care Choice:    Choice offered to:     DME Arranged:    DME Agency:     HH Arranged:    HH Agency:     Status of Service:  In process, will continue to follow  If discussed at Long Length of Stay Meetings, dates discussed:    Additional Comments:  Carles Collet, RN 01/21/2016, 11:14 AM

## 2016-01-21 NOTE — H&P (Signed)
History and Physical  Patient Name: Desiree [name]     FUX:[id number]    DOB: 1976-02-14    DOA: 01/21/2016 PCP: Windy Kalata, MD   Patient coming from: Home  Chief Complaint: Pain and swelling of dialysis fistula  HPI: Desiree [name] is a 40 y.o. female with a past medical history significant for ESRD on HD TThS and history of fistula aneurysm who presents with pain and swelling of her fistula for two days.  She was in her normal state of health until this past Sunday when she woke with new swelling and pain of the fistula in her left forearm.  The pain was achy, constant, severe in intensity, and radiated into the first 3 fingers of the left hand, which felt cold to her, and felt better if she rubs them. The pain did not improve with taking oxycodone 10 mg which she had at home, so today she came to the emergency room.  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  Started HD in 2010.    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 Donnetta Hutching without complication.      ROS: Review of Systems  Constitutional: Negative for chills, diaphoresis, fever and malaise/fatigue.  Musculoskeletal:       Arm pain, swelling of fistula, redness  Neurological: Positive for headaches.  All other systems reviewed and are negative.         Past Medical History:  Diagnosis Date  . Asthma    as a child  . Blood transfusion   .  Chronic kidney disease   . Dialysis patient ([place])   . GERD (gastroesophageal reflux disease)    nexium prn  . Headache(784.0)   . Hypertension   . Preterm labor   . Renal insufficiency     Past Surgical History:  Procedure Laterality Date  . AV FISTULA PLACEMENT Left 2010  . DILATION AND CURETTAGE OF UTERUS    . DILATION AND EVACUATION  08/02/2011   Procedure: DILATATION AND EVACUATION;  Surgeon: Logan Bores, MD;  Location: [facility];  Service: Gynecology;;  . Brigitte Pulse AND EVACUATION N/A 08/31/2013   Procedure: DILATATION AND EVACUATION;  Surgeon: Woodroe Mode, MD;  Location: [facility];  Service: Gynecology;  Laterality: N/A;  . RENAL BIOPSY    . REVISION OF ARTERIOVENOUS GORETEX GRAFT Left 02/11/2013   Procedure: REVISION OF ARTERIOVENOUS GORTEX FISTULA;  Surgeon: Rosetta Posner, MD;  Location: [facility];  Service: Vascular;  Laterality: Left;    Social History: Patient lives in [place].  The patient walks unassisted.  [place]  Lives with boyfriend and daughter, 19 yo.  Smokes occasionally.     Allergies  Allergen Reactions  . Prednisone Other (See Comments)    Other reaction(s): Other (See Comments) Muscle spasms Patient says prednisone causes her to cramp all over, muscle spasms uncontrolled  . Geralyn Flash [Fish Allergy] Itching, Swelling, Rash and Other (See Comments)    Face droops also  Family history: family history includes Diabetes in her mother; Heart disease in her mother; Hyperlipidemia in her mother; Hypertension in her mother.  Prior to Admission medications   Medication Sig Start Date End Date Taking? Authorizing Provider  cinacalcet (SENSIPAR) 30 MG tablet Take 30 mg by mouth daily.   Yes Historical Provider, MD  gabapentin (NEURONTIN) 100 MG capsule Take 300 mg by mouth 2 (two) times daily.    Yes Historical Provider, MD  Oxycodone HCl 10 MG TABS Take 10 mg by mouth daily.   Yes Historical Provider, MD  pantoprazole (PROTONIX) 40 MG tablet Take 40 mg by  mouth daily.   Yes Historical Provider, MD  sevelamer carbonate (RENVELA) 800 MG tablet Take 2,400 mg by mouth 3 (three) times daily with meals.   Yes Historical Provider, MD  sodium bicarbonate 325 MG tablet Take 325 mg by mouth 2 (two) times daily.   Yes Historical Provider, MD  azithromycin (ZITHROMAX) 250 MG tablet Take 1 tablet (250 mg total) by mouth daily. Take first 2 tablets together, then 1 every day until finished. Patient not taking: Reported on 01/21/2016 03/29/15   Dorie Rank, MD  doxycycline (VIBRAMYCIN) 100 MG capsule Take 1 capsule (100 mg total) by mouth 2 (two) times daily. Patient not taking: Reported on 01/21/2016 10/25/15   Dalia Heading, PA-C  methocarbamol (ROBAXIN) 500 MG tablet Take 1 tablet (500 mg total) by mouth 2 (two) times daily. Patient not taking: Reported on 01/21/2016 01/05/14   Tatyana Kirichenko, PA-C  multivitamin (RENA-VIT) TABS tablet Take 1 tablet by mouth at bedtime. Patient not taking: Reported on 01/21/2016 09/15/13   Janece Canterbury, MD  traMADol (ULTRAM) 50 MG tablet Take 1 tablet (50 mg total) by mouth every 6 (six) hours as needed for severe pain. Patient not taking: Reported on 01/21/2016 11/24/15   Antonietta Breach, PA-C       Physical Exam: BP (!) 154/106   Pulse 93   Temp 98.4 F (36.9 C) (Oral)   Resp 16   Ht 5\' 5"  (1.651 m)   Wt 87.5 kg (193 lb)   SpO2 100%   BMI 32.12 kg/m  General appearance: Well-developed, adult female, alert and in mild distress from pain.   Eyes: Anicteric, conjunctiva pink, lids and lashes normal. PERRL.    ENT: No nasal deformity, discharge, epistaxis.  Hearing normal. OP moist without lesions.   Neck: No neck masses.  Trachea midline.  No thyromegaly/tenderness. Lymph: No cervical or supraclavicular lymphadenopathy. Skin: Warm and dry.  No jaundice.  No suspicious rashes or lesions.  Mild hyperemia over graft.  No fluctuance, drainage, tissue edema, swelling. Cardiac: RRR, nl S1-S2, no murmurs appreciated.   Capillary refill is brisk.  JVP normal.  No LE edema.  DP pulses 2+ and symmetric.  Right radial pulse normal.  Left forearm bruit is strong.  Left arm fistula is very tender to gentle pressure throughout. Respiratory: Normal respiratory rate and rhythm.  CTAB without rales or wheezes. Abdomen: Abdomen soft.  No TTP. No ascites, distension, hepatosplenomegaly.   MSK: No deformities or effusions.  No cyanosis or clubbing. Neuro: Cranial nerves normal.  Sensation intact to light touch. Speech is fluent.  Muscle strength normal.    Psych: Sensorium intact and responding to questions, attention normal.  Behavior appropriate.  Affect normal.  Judgment and insight appear normal.     Labs on Admission:  I have personally reviewed following labs and imaging studies: CBC:  Recent Labs Lab 01/21/16 0351  WBC 7.2  NEUTROABS 2.8  HGB 11.5*  HCT 36.1  MCV 92.1  PLT 258   Basic Metabolic Panel:  Recent Labs Lab 01/21/16 0351  NA 137  K 4.9  CL 95*  CO2 23  GLUCOSE 87  BUN 71*  CREATININE 19.63*  CALCIUM 9.9   GFR: Estimated Creatinine Clearance: 4.2 mL/min (by C-G formula based on SCr of 19.63 mg/dL (H)).  Liver Function Tests: No results for input(s): AST, ALT, ALKPHOS, BILITOT, PROT, ALBUMIN in the last 168 hours. No results for input(s): LIPASE, AMYLASE in the last 168 hours. No results for input(s): AMMONIA in the last 168 hours. Coagulation Profile: No results for input(s): INR, PROTIME in the last 168 hours. Cardiac Enzymes: No results for input(s): CKTOTAL, CKMB, CKMBINDEX, TROPONINI in the last 168 hours. BNP (last 3 results) No results for input(s): PROBNP in the last 8760 hours. HbA1C: No results for input(s): HGBA1C in the last 72 hours. CBG: No results for input(s): GLUCAP in the last 168 hours. Lipid Profile: No results for input(s): CHOL, HDL, LDLCALC, TRIG, CHOLHDL, LDLDIRECT in the last 72 hours. Thyroid Function Tests: No results for input(s): TSH, T4TOTAL,  FREET4, T3FREE, THYROIDAB in the last 72 hours. Anemia Panel: No results for input(s): VITAMINB12, FOLATE, FERRITIN, TIBC, IRON, RETICCTPCT in the last 72 hours. Sepsis Labs: Lactic acid normal Invalid input(s): PROCALCITONIN, LACTICIDVEN No results found for this or any previous visit (from the past 240 hour(s)).            Assessment/Plan  1. Left arm pain:  Possible aneurysm.  No WBC, fever to suggest infection at this time.  Given one dose empiric antibiotics after cultures obtained in ER.   -Obtain doppler US of the left arm -Consult to Vascular surgery -Continue home oxycodone 10 mg PRN for pain     2. ESRD:  Typically dialyzes TThS.  No need for urgent HD today.   -Continue Phoslo, Renvela, Bicarb  3. Anemia of renal disease:  Stable.  4. Chronic pain:  She states she has chronic arm pain.  Her outside [facility] notes state that she has chronic back pain. -Will continue oxycodone 10 mg PRN in hospital -Continue Gabapentin  5. Other medications:  -Continue PPI       DVT prophylaxis: Heparin  Code Status: FULL  Family Communication: Boyfriend at bedside  Disposition Plan: Anticipate ultrasound of fistula and then discussion with Vascular Surgery.  Further disposition pending results of test and consult. Consults called: None overnight Admission status: OBS, med surg At the point of initial evaluation, it is my clinical opinion that admission for OBSERVATION is reasonable and necessary because the patient's presenting complaints in the context of their chronic conditions represent sufficient risk of deterioration or significant morbidity to constitute reasonable grounds for close observation in the hospital setting, but that the patient may be medically stable for discharge from the hospital within 24 to 48 hours.    Medical decision making: Patient seen at 4:55 AM on 01/21/2016.  The patient was discussed with Dr. Leonides Schanz.  What exists of the patient's  chart and outside records requested via [place] were reviewed in depth and summarized above.  Clinical condition: stable.        Edwin Dada [facility] Pager [phone]

## 2016-01-21 NOTE — Progress Notes (Signed)
PHARMACY - PHYSICIAN COMMUNICATION CRITICAL VALUE ALERT - BLOOD CULTURE IDENTIFICATION (BCID)  Results for orders placed or performed during the hospital encounter of 01/21/16  Blood Culture ID Panel (Reflexed) (Collected: 01/21/2016  4:00 AM)  Result Value Ref Range   Enterococcus species NOT DETECTED NOT DETECTED   Listeria monocytogenes NOT DETECTED NOT DETECTED   Staphylococcus species DETECTED (A) NOT DETECTED   Staphylococcus aureus NOT DETECTED NOT DETECTED   Methicillin resistance NOT DETECTED NOT DETECTED   Streptococcus species NOT DETECTED NOT DETECTED   Streptococcus agalactiae NOT DETECTED NOT DETECTED   Streptococcus pneumoniae NOT DETECTED NOT DETECTED   Streptococcus pyogenes NOT DETECTED NOT DETECTED   Acinetobacter baumannii NOT DETECTED NOT DETECTED   Enterobacteriaceae species NOT DETECTED NOT DETECTED   Enterobacter cloacae complex NOT DETECTED NOT DETECTED   Escherichia coli NOT DETECTED NOT DETECTED   Klebsiella oxytoca NOT DETECTED NOT DETECTED   Klebsiella pneumoniae NOT DETECTED NOT DETECTED   Proteus species NOT DETECTED NOT DETECTED   Serratia marcescens NOT DETECTED NOT DETECTED   Haemophilus influenzae NOT DETECTED NOT DETECTED   Neisseria meningitidis NOT DETECTED NOT DETECTED   Pseudomonas aeruginosa NOT DETECTED NOT DETECTED   Candida albicans NOT DETECTED NOT DETECTED   Candida glabrata NOT DETECTED NOT DETECTED   Candida krusei NOT DETECTED NOT DETECTED   Candida parapsilosis NOT DETECTED NOT DETECTED   Candida tropicalis NOT DETECTED NOT DETECTED    Name of physician (or Provider) Contacted: Dr. Scot Dock ([facility])  Changes to prescribed antibiotics required: Start Vancomycin   Narda Bonds 01/21/2016  11:23 PM

## 2016-01-21 NOTE — Progress Notes (Signed)
Patient ID: Desiree [name], female   DOB: November 24, 1975, 40 y.o.   MRN: [id number]  [facility] Progress Note    Subjective:   Feels well but still has some pain at fistula.  She skipped HD twice in the last 2 weeks and has not run her full treatment in the last month.  Despite this she is not in any distress nor has any electrolyte abnormalities to warrant acute dialysis.  Awaiting VVS evaluation of AVF before proceeding with HD   Objective:   BP 125/72 (BP Location: Right Arm)   Pulse 92   Temp 98.2 F (36.8 C) (Oral)   Resp 18   Ht 5\' 5"  (1.651 m)   Wt 87.5 kg (193 lb)   SpO2 96%   BMI 32.12 kg/m   Intake/Output: No intake/output data recorded.   Intake/Output this shift:  Total I/O In: 240 [P.O.:240] Out: -  Weight change:   Physical Exam: Gen:WD obese AAF in NAd CVS:no rub Resp:cta WUJ:[id number] Ext:no edema, LAVF with aneurysmal change at arterial anastamosis and some warmth to touch and tenderness.  +T/B  Labs: BMET  Recent Labs Lab 01/21/16 0351  NA 137  K 4.9  CL 95*  CO2 23  GLUCOSE 87  BUN 71*  CREATININE 19.63*  CALCIUM 9.9   CBC  Recent Labs Lab 01/21/16 0351  WBC 7.2  NEUTROABS 2.8  HGB 11.5*  HCT 36.1  MCV 92.1  PLT 197    @IMGRELPRIORS @ Medications:    . cinacalcet  30 mg Oral Q breakfast  . gabapentin  300 mg Oral BID  . heparin  5,000 Units Subcutaneous Q8H  . pantoprazole  40 mg Oral Daily  . sevelamer carbonate  2,400 mg Oral TID WC  . sodium bicarbonate  325 mg Oral BID     Assessment/ Plan:   1. Swollen and tender AVF- had elevated velocities on duplex. Await VVS eval 2. ESRD plan for HD after VVs evaluation 3. Anemia: stable 4. CKD-MBD: continue with binders 5. Nutrition: cont with renal diet 6. Hypertension:stable  Donetta Potts, MD [place] Pager [phone] 01/21/2016, 2:17 PM

## 2016-01-21 NOTE — Progress Notes (Signed)
Pharmacy Antibiotic Note  Desiree [name] is a 40 y.o. female admitted on 01/21/2016 with fistula pain.  Pharmacy has been consulted for Vancomycin dosing for bacteremia. BCID + for staph species. Pt has ESRD on HD and is here with pain at fistula site. Usually TTS HD, but due to missed HD will likely be going tomorrow for a session.   Plan: -Give an additional 1000 mg IV Vancomycin now to complete 2000 mg load (received 1000 mg earlier today) -F/U HD plans for supplemental doses  Height: 5\' 5"  (165.1 cm) Weight: 193 lb (87.5 kg) IBW/kg (Calculated) : 57  Temp (24hrs), Avg:98.5 F (36.9 C), Min:98.2 F (36.8 C), Max:98.7 F (37.1 C)   Recent Labs Lab 01/21/16 0351 01/21/16 0358  WBC 7.2  --   CREATININE 19.63*  --   LATICACIDVEN  --  0.96    Estimated Creatinine Clearance: 4.2 mL/min (by C-G formula based on SCr of 19.63 mg/dL (H)).    Allergies  Allergen Reactions  . Prednisone Other (See Comments)    Other reaction(s): Other (See Comments) Muscle spasms Patient says prednisone causes her to cramp all over, muscle spasms uncontrolled  . Tuna [Fish Allergy] Itching, Swelling, Rash and Other (See Comments)    Face droops also   Narda Bonds 01/21/2016 11:24 PM

## 2016-01-21 NOTE — Progress Notes (Signed)
Paged Dr. Marval Regal to see if he was going to place orders for HD today for pt.  Return call and Dr. Marval Regal stated she will have HD first thing in the morning.  Informed pt. And husband who was at bedside.  Will continue to monitor.  Alphonzo Lemmings, RN

## 2016-01-21 NOTE — ED Provider Notes (Signed)
TIME SEEN:  By signing my name below, I, Arianna Nassar, attest that this documentation has been prepared under the direction and in the presence of [employer], DO.  Electronically Signed: Julien Nordmann, ED Scribe. 01/21/16. 3:37 AM.   CHIEF COMPLAINT:  Chief Complaint  Patient presents with  . Headache  . Vascular Access Problem     HPI:  HPI Comments: Desiree [name] is a 40 y.o. female who has a PMhx of Hypertension, end-stage renal disease on hemodialysis Tuesday, Thursday and Saturday who was last dialyzed on Saturday who presents to the Emergency Department complaining of intermittent, gradual worsening, frontal headache x 3 weeks. Describes the pain as sharp and "hurting" pain. No alleviating or aggravating factors. No head injury recently. Not on anticoagulation. No numbness, tingling or focal weakness. Gradual onset headache. Has not been taking anything at home.   She also reports having increased pain to her dialysis fistula in her left forearm. Pt says she had associated fever (tmax 101) x 2 days. Pt has had her fistula placed about 3 years ago. She further reports having an aneurysm in the same arm a few years ago. No bleeding or drainage from this arm. She reports it has been warm and hot.    PCP: Windy Kalata, MD   ROS: See HPI Constitutional: no fever  Eyes: no drainage  ENT: no runny nose   Cardiovascular:  no chest pain  Resp: no SOB  GI: no vomiting GU: no dysuria Integumentary: no rash  Allergy: no hives  Musculoskeletal: no leg swelling  Neurological: no slurred speech ROS otherwise negative  PAST MEDICAL HISTORY/PAST SURGICAL HISTORY:  Past Medical History:  Diagnosis Date  . Asthma    as a child  . Blood transfusion   . Chronic kidney disease   . Dialysis patient ([place])   . GERD (gastroesophageal reflux disease)    nexium prn  . Headache(784.0)   . Hypertension   . Preterm labor   . Renal insufficiency     MEDICATIONS:  Prior to  Admission medications   Medication Sig Start Date End Date Taking? Authorizing Provider  azithromycin (ZITHROMAX) 250 MG tablet Take 1 tablet (250 mg total) by mouth daily. Take first 2 tablets together, then 1 every day until finished. Patient not taking: Reported on 10/25/2015 03/29/15   Dorie Rank, MD  cinacalcet (SENSIPAR) 30 MG tablet Take 30 mg by mouth daily.    Historical Provider, MD  cyclobenzaprine (FLEXERIL) 5 MG tablet Take 5 mg by mouth 3 (three) times daily as needed for muscle spasms.    Historical Provider, MD  diphenhydrAMINE (BENADRYL) 25 MG tablet Take 50 mg by mouth daily.     Historical Provider, MD  doxycycline (VIBRAMYCIN) 100 MG capsule Take 1 capsule (100 mg total) by mouth 2 (two) times daily. 10/25/15   Dalia Heading, PA-C  gabapentin (NEURONTIN) 100 MG capsule Take 300 mg by mouth 2 (two) times daily.     Historical Provider, MD  methocarbamol (ROBAXIN) 500 MG tablet Take 1 tablet (500 mg total) by mouth 2 (two) times daily. Patient not taking: Reported on 10/25/2015 01/05/14   Jeannett Senior, PA-C  multivitamin (RENA-VIT) TABS tablet Take 1 tablet by mouth at bedtime. Patient not taking: Reported on 10/25/2015 09/15/13   Janece Canterbury, MD  Oxycodone HCl 10 MG TABS Take 10 mg by mouth daily.    Historical Provider, MD  pantoprazole (PROTONIX) 40 MG tablet Take 40 mg by mouth daily.    Historical  Provider, MD  sevelamer carbonate (RENVELA) 2.4 G PACK Take 2.4 g by mouth 3 (three) times daily with meals.    Historical Provider, MD  sodium bicarbonate 325 MG tablet Take 325 mg by mouth 2 (two) times daily.    Historical Provider, MD  traMADol (ULTRAM) 50 MG tablet Take 1 tablet (50 mg total) by mouth every 6 (six) hours as needed for severe pain. 11/24/15   Antonietta Breach, PA-C    ALLERGIES:  Allergies  Allergen Reactions  . Prednisone Other (See Comments)    Other reaction(s): Other (See Comments) Muscle spasms Patient says prednisone causes her to cramp all over,  muscle spasms uncontrolled  . Geralyn Flash [Fish Allergy] Itching, Swelling, Rash and Other (See Comments)    Face droops also    SOCIAL HISTORY:  Social History  Substance Use Topics  . Smoking status: Passive Smoke Exposure - Never Smoker  . Smokeless tobacco: Never Used  . Alcohol use No    FAMILY HISTORY: Family History  Problem Relation Age of Onset  . Diabetes Mother   . Hyperlipidemia Mother   . Hypertension Mother   . Heart disease Mother   . Hypertension    . Diabetes type II      EXAM: BP (!) 183/108 (BP Location: Right Arm)   Pulse 91   Temp 98.4 F (36.9 C) (Oral)   Resp 16   Ht 5\' 5"  (1.651 m)   Wt 193 lb (87.5 kg)   SpO2 100%   BMI 32.12 kg/m  CONSTITUTIONAL: Alert and oriented and responds appropriately to questions. Chronically ill appearing, Hypertensive, in no acute distress, afebrile and nontoxic HEAD: Normocephalic EYES: Conjunctivae clear, PERRL ENT: normal nose; no rhinorrhea; moist mucous membranes NECK: Supple, no meningismus, no LAD  CARD: RRR; S1 and S2 appreciated; no murmurs, no clicks, no rubs, no gallops RESP: Normal chest excursion without splinting or tachypnea; breath sounds clear and equal bilaterally; no wheezes, no rhonchi, no rales, no hypoxia or respiratory distress, speaking full sentences ABD/GI: Normal bowel sounds; non-distended; soft, non-tender, no rebound, no guarding, no peritoneal signs BACK:  The back appears normal and is non-tender to palpation, there is no CVA tenderness EXT: AV fistula in the left volar forearm with good thrill and bruit, most proximal portion is red hot, swollen and TTP, 2+ radial pulses bilaterally, no joint effusion noted, no deformity otherwise normal ROM in all joints; otherwise external views are non-tender to palpation; no edema; normal capillary refill; no cyanosis, no calf tenderness or swelling SKIN: Normal color for age and race; warm; no rash NEURO: Moves all extremities equally, sensation to light  touch intact diffusely, cranial nerves II through XII intact PSYCH: The patient's mood and manner are appropriate. Grooming and personal hygiene are appropriate.  MEDICAL DECISION MAKING: Patient here with what appears to be a possible infected dialysis graft. She has redness, tenderness and warmth over this area. Reports fevers at home of 101. No fever here today. Hemodynamically stable other than being hypertensive. Labs show no leukocytosis, normal lactate. I feel she will need admission. She may also benefit from an ultrasound of this arm to rule out aneurysm that we cannot obtain this at this time of night. She has equal pulses in bilateral upper extremity is in her extremities are warm and well-perfused. No bleeding or discharge from her fistula. Her PCP is Dr. Tami Ribas in [place].  Nephrologist is Dr. Mercy Moore. She has received IV morphine for pain, IV vancomycin. Blood cultures are also pending.  As for patient's headache, this appears to be nonspecific. Doubt intracranial hemorrhage, meningitis. No neurologic deficits, no head injury, no meningismus. Present for 3 weeks. It seems to be a secondary complaint for her. I do not feel she needs head imaging emergently at this time.  ED PROGRESS: 4:45 AM  Discussed patient's case with hospitalist, Dr. Loleta Books.  Recommend admission to medical, observation bed.  I will place holding orders per their request. Patient and family (if present) updated with plan. Care transferred to hospitalist service.  I reviewed all nursing notes, vitals, pertinent old records, EKGs, labs, imaging (as available).      I personally performed the services described in this documentation, which was scribed in my presence. The recorded information has been reviewed and is accurate.    [place], DO 01/21/16 [id number]

## 2016-01-21 NOTE — Progress Notes (Signed)
Paged Dr. Marval Regal to inform him of vascular surgery consult note.  Return call from him and informed, also ask him to order phoslo for pt., takes 2 tabs at home with meals.  Will continue to following and await new orders.  Alphonzo Lemmings, RN

## 2016-01-21 NOTE — Care Management Obs Status (Signed)
[place] NOTIFICATION   Patient Details  Name: FAYELYNN DISTEL MRN: [id number] Date of Birth: [birth year]   Medicare Observation Status Notification Given:  Yes    Carles Collet, RN 01/21/2016, 11:13 AM

## 2016-01-21 NOTE — Progress Notes (Signed)
Pt. Went off the floor, stating she was going to get something to eat.  Paged Dr. Eliseo Squires and she returned my call, stating she had the conversation with the pt., that she couldn't leave the floor. She can have her friend or family go get her something to eat. Will await for pt. To come back and give her the [facility] paper.  Alphonzo Lemmings, RN

## 2016-01-21 NOTE — Progress Notes (Signed)
Patient arrived to the floor.  Requesting IV pain medication.  Left arm in swollen and warm to touch close to fistula site.  Oriented to room, call bell. Is accompanied by a female at the bedside.  Safety maintained.

## 2016-01-22 DIAGNOSIS — M79632 Pain in left forearm: Secondary | ICD-10-CM | POA: Diagnosis not present

## 2016-01-22 DIAGNOSIS — T82868A Thrombosis of vascular prosthetic devices, implants and grafts, initial encounter: Principal | ICD-10-CM

## 2016-01-22 DIAGNOSIS — Y712 Prosthetic and other implants, materials and accessory cardiovascular devices associated with adverse incidents: Secondary | ICD-10-CM | POA: Diagnosis not present

## 2016-01-22 DIAGNOSIS — Z992 Dependence on renal dialysis: Secondary | ICD-10-CM

## 2016-01-22 DIAGNOSIS — M79602 Pain in left arm: Secondary | ICD-10-CM | POA: Diagnosis not present

## 2016-01-22 DIAGNOSIS — N186 End stage renal disease: Secondary | ICD-10-CM

## 2016-01-22 DIAGNOSIS — G8929 Other chronic pain: Secondary | ICD-10-CM

## 2016-01-22 DIAGNOSIS — T82898A Other specified complication of vascular prosthetic devices, implants and grafts, initial encounter: Secondary | ICD-10-CM | POA: Diagnosis not present

## 2016-01-22 LAB — RENAL FUNCTION PANEL
ANION GAP: 18 — AB (ref 5–15)
Albumin: 3.4 g/dL — ABNORMAL LOW (ref 3.5–5.0)
BUN: 82 mg/dL — AB (ref 6–20)
CHLORIDE: 93 mmol/L — AB (ref 101–111)
CO2: 25 mmol/L (ref 22–32)
Calcium: 9.1 mg/dL (ref 8.9–10.3)
Creatinine, Ser: 22.6 mg/dL — ABNORMAL HIGH (ref 0.44–1.00)
GFR calc Af Amer: 2 mL/min — ABNORMAL LOW (ref >60)
GFR calc non Af Amer: 2 mL/min — ABNORMAL LOW (ref >60)
GLUCOSE: 95 mg/dL (ref 65–99)
Phosphorus: 7.5 mg/dL — ABNORMAL HIGH (ref 2.5–4.6)
Potassium: 5.7 mmol/L — ABNORMAL HIGH (ref 3.5–5.1)
Sodium: 136 mmol/L (ref 135–145)

## 2016-01-22 LAB — CBC
HEMATOCRIT: 33 % — AB (ref 36.0–46.0)
Hemoglobin: 10.6 g/dL — ABNORMAL LOW (ref 12.0–15.0)
MCH: 29.2 pg (ref 26.0–34.0)
MCHC: 32.1 g/dL (ref 30.0–36.0)
MCV: 90.9 fL (ref 78.0–100.0)
Platelets: 202 10*3/uL (ref 150–400)
RBC: 3.63 MIL/uL — ABNORMAL LOW (ref 3.87–5.11)
RDW: 16.7 % — AB (ref 11.5–15.5)
WBC: 6.5 10*3/uL (ref 4.0–10.5)

## 2016-01-22 LAB — MRSA PCR SCREENING: MRSA by PCR: NEGATIVE

## 2016-01-22 MED ORDER — VANCOMYCIN HCL IN DEXTROSE 1-5 GM/200ML-% IV SOLN
1000.0000 mg | INTRAVENOUS | Status: DC
  Filled 2016-01-22: qty 200

## 2016-01-22 MED ORDER — DIPHENHYDRAMINE HCL 25 MG PO CAPS: 25.0000 mg | ORAL_CAPSULE | Freq: Four times a day (QID) | ORAL | Status: DC | PRN

## 2016-01-22 NOTE — Progress Notes (Signed)
Paged Dr. Marval Regal at [phone]. Need order placed for patient to go to HD. Waiting for call back.  Patient also needs benedryl.  Oxycodone makes her itch and she states she takes benedryl at home for this.

## 2016-01-22 NOTE — Progress Notes (Signed)
Patient called RN to bedside after having emesis. Patient stated " I just threw up all my pain medicine can I get something IV?" while she was eating a cracker with peanut butter. Explained to patient that we can not give her IV pain medication and we will have to wait until she can get PO pain medication again. Also explained to patient that we will see how she does with the cracker she is eating and maybe that will help her stomach.

## 2016-01-22 NOTE — Progress Notes (Signed)
Late entry for 01/21/16 @2124   Paged [facility] to get order for patient to have HD in the am. Waiting for call back.

## 2016-01-22 NOTE — Consult Note (Signed)
[facility] for Infectious Disease       Reason for Consult: CoNS bacteremia    Referring Physician: Dr Eliseo Squires  Principal Problem:   Left arm pain Active Problems:   ESRD (end stage renal disease) ([place])   Anemia of chronic kidney failure   Chronic pain   . cinacalcet  30 mg Oral Q breakfast  . feeding supplement (NEPRO CARB STEADY)  237 mL Oral BID BM  . gabapentin  300 mg Oral BID  . heparin  5,000 Units Subcutaneous Q8H  . pantoprazole  40 mg Oral Daily  . sevelamer carbonate  2,400 mg Oral TID WC  . sodium bicarbonate  325 mg Oral BID  . [START ON 01/23/2016] vancomycin  1,000 mg Intravenous Q T,Th,Sa-HD    Recommendations: Vancomycin for 6 weeks Can narrow to cefazolin if sensitive Repeat Blood cultures to assure clearance   Assessment: She has CoNS bacteremia in the setting of recent onset of pain at fistula site and thrombus.  Site does not appear infected. If repeat blood cultures remain negative no further work up needed.    Antibiotics: Vancomycin   HPI: Desiree [name] is a 40 y.o. female with ESRD on dialysis who presented yesterday with pain and swelling of fistula site on left forearm.  She had a vascular ultrasound done and noted an aneurysmal area in the cephalic vein suggestive of a thrombus.  Patient reported fever as well and blood cultures sent now positive for CoNS, 2/2.  Started on empiric vancomycin.  Pain noted to be severe, cold fingers.  Had recently missed dialysis.     Review of Systems:  Constitutional: positive for fevers or negative for malaise Gastrointestinal: negative for diarrhea Integument/breast: negative for rash All other systems reviewed and are negative    Past Medical History:  Diagnosis Date  . Childhood asthma   . Complication of anesthesia    "sometimes it does not work; didn't during [place]" (01/21/2016)  . ESRD (end stage renal disease) on dialysis ([place])    "TTS; [facility]; [facility]" (01/21/2016)  . GERD  (gastroesophageal reflux disease)    nexium prn  . Gout   . History of blood transfusion    "related to kidneys; I've had 4" (01/21/2016)  . Hypertension   . Migraine    "qod now" (01/21/2016)  . Preterm labor ~ 2014  . Renal insufficiency   . Seizures ([place])    "last one was in 2000; related to preeclampsia" (01/21/2016)    Social History  Substance Use Topics  . Smoking status: Passive Smoke Exposure - Never Smoker    Types: Cigarettes  . Smokeless tobacco: Never Used  . Alcohol use No    Family History  Problem Relation Age of Onset  . Diabetes Mother   . Hyperlipidemia Mother   . Hypertension Mother   . Heart disease Mother   . Hypertension    . Diabetes type II      Allergies  Allergen Reactions  . Prednisone Other (See Comments)    Other reaction(s): Other (See Comments) Muscle spasms Patient says prednisone causes her to cramp all over, muscle spasms uncontrolled  . Tuna [Fish Allergy] Itching, Swelling, Rash and Other (See Comments)    Face droops also    Physical Exam: Constitutional: in no apparent distress  Vitals:   01/22/16 0736 01/22/16 0936  BP: 138/77 (!) 145/85  Pulse: 96 95  Resp: 16   Temp: 98.9 F (37.2 C)  EYES: anicteric ENMT: Cardiovascular: Cor RRR Respiratory: CTA B; normal respiratory effort GI: Bowel sounds are normal, liver is not enlarged, spleen is not enlarged Musculoskeletal: no pedal edema noted Skin: negatives: left fistula with thrill, warmth, no fluctuance, no erythema Hematologic: no cervical lad  Lab Results  Component Value Date   WBC 7.2 01/21/2016   HGB 11.5 (L) 01/21/2016   HCT 36.1 01/21/2016   MCV 92.1 01/21/2016   PLT 197 01/21/2016    Lab Results  Component Value Date   CREATININE 19.63 (H) 01/21/2016   BUN 71 (H) 01/21/2016   NA 137 01/21/2016   K 4.9 01/21/2016   CL 95 (L) 01/21/2016   CO2 23 01/21/2016    Lab Results  Component Value Date   ALT 10 (L) 09/02/2015   AST 15 09/02/2015    ALKPHOS 63 09/02/2015     Microbiology: Recent Results (from the past 240 hour(s))  Blood culture (routine x 2)     Status: None (Preliminary result)   Collection Time: 01/21/16  3:43 AM  Result Value Ref Range Status   Specimen Description BLOOD RIGHT ARM  Final   Special Requests IN PEDIATRIC BOTTLE 4ML  Final   Culture  Setup Time   Final    AEROBIC BOTTLE ONLY GRAM POSITIVE COCCI IN CLUSTERS CRITICAL VALUE NOTED.  VALUE IS CONSISTENT WITH PREVIOUSLY REPORTED AND CALLED VALUE.    Culture GRAM POSITIVE COCCI  Final   Report Status PENDING  Incomplete  Blood culture (routine x 2)     Status: Abnormal (Preliminary result)   Collection Time: 01/21/16  4:00 AM  Result Value Ref Range Status   Specimen Description BLOOD RIGHT HAND  Final   Special Requests BOTTLES DRAWN AEROBIC AND ANAEROBIC 5ML  Final   Culture  Setup Time   Final    IN BOTH AEROBIC AND ANAEROBIC BOTTLES GRAM POSITIVE COCCI IN CLUSTERS CRITICAL RESULT CALLED TO, READ BACK BY AND VERIFIED WITH: J LEDFORD PHARMD 2310 01/21/16 A BROWNING    Culture STAPHYLOCOCCUS SPECIES (COAGULASE NEGATIVE) (A)  Final   Report Status PENDING  Incomplete  Blood Culture ID Panel (Reflexed)     Status: Abnormal   Collection Time: 01/21/16  4:00 AM  Result Value Ref Range Status   Enterococcus species NOT DETECTED NOT DETECTED Final   Listeria monocytogenes NOT DETECTED NOT DETECTED Final   Staphylococcus species DETECTED (A) NOT DETECTED Final    Comment: CRITICAL RESULT CALLED TO, READ BACK BY AND VERIFIED WITH: J LEDFORD PHARMD 2310 01/21/16 A BROWNING    Staphylococcus aureus NOT DETECTED NOT DETECTED Final   Methicillin resistance NOT DETECTED NOT DETECTED Final   Streptococcus species NOT DETECTED NOT DETECTED Final   Streptococcus agalactiae NOT DETECTED NOT DETECTED Final   Streptococcus pneumoniae NOT DETECTED NOT DETECTED Final   Streptococcus pyogenes NOT DETECTED NOT DETECTED Final   Acinetobacter baumannii NOT DETECTED  NOT DETECTED Final   Enterobacteriaceae species NOT DETECTED NOT DETECTED Final   Enterobacter cloacae complex NOT DETECTED NOT DETECTED Final   Escherichia coli NOT DETECTED NOT DETECTED Final   Klebsiella oxytoca NOT DETECTED NOT DETECTED Final   Klebsiella pneumoniae NOT DETECTED NOT DETECTED Final   Proteus species NOT DETECTED NOT DETECTED Final   Serratia marcescens NOT DETECTED NOT DETECTED Final   Haemophilus influenzae NOT DETECTED NOT DETECTED Final   Neisseria meningitidis NOT DETECTED NOT DETECTED Final   Pseudomonas aeruginosa NOT DETECTED NOT DETECTED Final   Candida albicans NOT DETECTED NOT DETECTED Final  Candida glabrata NOT DETECTED NOT DETECTED Final   Candida krusei NOT DETECTED NOT DETECTED Final   Candida parapsilosis NOT DETECTED NOT DETECTED Final   Candida tropicalis NOT DETECTED NOT DETECTED Final  MRSA PCR Screening     Status: None   Collection Time: 01/22/16 11:01 AM  Result Value Ref Range Status   MRSA by PCR NEGATIVE NEGATIVE Final    Comment:        The GeneXpert MRSA Assay (FDA approved for NASAL specimens only), is one component of a comprehensive MRSA colonization surveillance program. It is not intended to diagnose MRSA infection nor to guide or monitor treatment for MRSA infections.     Scharlene Gloss, [facility] for Infectious Disease [place] [email or web address] [phone] pager  [phone] cell 01/22/2016, 12:28 PM

## 2016-01-22 NOTE — Progress Notes (Signed)
Dialysis treatment completed.  644 mL ultrafiltrated and net fluid removal 144 mL.    Patient status unchanged. Lung sounds clear to ausculation in all fields. Generalized edema. Cardiac: NSR.  Disconnected lines and removed needles.  Pressure held for 10 minutes and band aid/gauze dressing applied.  Report given to bedside RN, Junie Panning.

## 2016-01-22 NOTE — Procedures (Signed)
I was present at this dialysis session. I have reviewed the session itself and made appropriate changes. Cultures + for coag negative staph.  Started on vancomycin IV and duplex with partial thrombus of cephalic vein run off.  Seen by Dr. Scot Dock who agreed with IV abx to start.   Filed Weights   01/20/16 2040 01/22/16 1242  Weight: 87.5 kg (193 lb) 87.7 kg (193 lb 5.5 oz)     Recent Labs Lab 01/21/16 0351  NA 137  K 4.9  CL 95*  CO2 23  GLUCOSE 87  BUN 71*  CREATININE 19.63*  CALCIUM 9.9     Recent Labs Lab 01/21/16 0351 01/22/16 1251  WBC 7.2 6.5  NEUTROABS 2.8  --   HGB 11.5* 10.6*  HCT 36.1 33.0*  MCV 92.1 90.9  PLT 197 202    Scheduled Meds: . cinacalcet  30 mg Oral Q breakfast  . feeding supplement (NEPRO CARB STEADY)  237 mL Oral BID BM  . gabapentin  300 mg Oral BID  . heparin  5,000 Units Subcutaneous Q8H  . pantoprazole  40 mg Oral Daily  . sevelamer carbonate  2,400 mg Oral TID WC  . sodium bicarbonate  325 mg Oral BID  . [START ON 01/23/2016] vancomycin  1,000 mg Intravenous Q T,Th,Sa-HD   Continuous Infusions: PRN Meds:.acetaminophen **OR** acetaminophen, diphenhydrAMINE, ondansetron **OR** ondansetron (ZOFRAN) IV, oxyCODONE-acetaminophen **AND** oxyCODONE, senna-docusate   Donetta Potts,  MD 01/22/2016, 1:16 PM

## 2016-01-22 NOTE — Progress Notes (Signed)
Nutrition Brief Note  Patient identified on the Malnutrition Screening Tool (MST) Report  Wt Readings from Last 15 Encounters:  01/20/16 193 lb (87.5 kg)  11/24/15 197 lb 5 oz (89.5 kg)  09/02/15 (S) 208 lb 8 oz (94.6 kg)  03/29/15 196 lb (88.9 kg)  12/20/13 171 lb 15.3 oz (78 kg)  09/15/13 176 lb 2.4 oz (79.9 kg)  09/14/13 184 lb 8 oz (83.7 kg)  09/08/13 178 lb 5.6 oz (80.9 kg)  08/30/13 177 lb 7.5 oz (80.5 kg)  08/22/13 180 lb (81.6 kg)  08/08/13 182 lb 6.4 oz (82.7 kg)  02/21/13 170 lb 6.4 oz (77.3 kg)  02/10/13 179 lb (81.2 kg)  11/07/12 169 lb (76.7 kg)  09/12/12 171 lb 12.8 oz (77.9 kg)   Aradia N Deutschman is a 40 y.o. female with a past medical history significant for ESRD on HD TThS and history of fistula aneurysm who presents with pain and swelling of her fistula for two days.  Body mass index is 32.12 kg/m. Patient meets criteria for obesity, class I based on current BMI.   Current diet order is renal with 1200 ml fluid restriction , patient is consuming approximately 100% of meals at this time. Labs and medications reviewed.   No nutrition interventions warranted at this time. If nutrition issues arise, please consult RD.   Ellisha Bankson A. Jimmye Norman, RD, LDN, CDE Pager: [phone] After hours Pager: [phone]

## 2016-01-22 NOTE — Progress Notes (Signed)
PROGRESS NOTE    Desiree [name]  VBT:[id number] DOB: 07-08-1975 DOA: 01/21/2016 PCP: Windy Kalata, MD   Outpatient Specialists:     Brief Narrative:  Desiree [name] is a 40 y.o. female with a past medical history significant for ESRD on HD TThS and history of fistula aneurysm who presents with pain and swelling of her fistula for two days.  She was in her normal state of health until this past Sunday when she woke with new swelling and pain of the fistula in her left forearm.  The pain was achy, constant, severe in intensity, and radiated into the first 3 fingers of the left hand, which felt cold to her, and felt better if she rubs them. The pain did not improve with taking oxycodone 10 mg which she had at home, so today she came to the emergency room.   Assessment & Plan:   Principal Problem:   Left arm pain Active Problems:   ESRD (end stage renal disease) ([facility])   Anemia of chronic kidney failure   Chronic pain   Gram + bacteremia 2/2-- staph coag negative -vancomycin per pharmacy -ID consult  ESRD -on HD t/th/sat -getting HD today  PAINFUL LEFT RADIOCEPHALIC AV FISTULA -seen by vascular -U/S done: left radio-cephalic arteriovenous fistula is patent. 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   DVT prophylaxis:  SQ Heparin  Code Status:  Full Code  Family Communication: Friend at bedside  Disposition Plan:     Consultants:   Vascular   renal     Subjective: Patient states that vascular dr said she could go home after dialysis and he would give her a pain prescription  Objective: Vitals:   01/21/16 2142 01/22/16 0528 01/22/16 0736 01/22/16 0936  BP: (!) 154/89 131/78 138/77 (!) 145/85  Pulse: 84 90 96 95  Resp: 18 18 16   Temp: 98.6 F (37 C) 98.6 F (37 C) 98.9 F (37.2 C)   TempSrc: Oral Oral Oral   SpO2: 100% 97% 96% 100%    Weight:      Height:        Intake/Output Summary (Last 24 hours) at 01/22/16 1203 Last data filed at 01/22/16 0900  Gross per 24 hour  Intake              720 ml  Output                0 ml  Net              72 0 ml   Filed Weights   01/20/16 2040  Weight: 87.5 kg (193 lb)    Examination:  General exam: sleepy Respiratory system: Clear to auscultation. Respiratory effort normal. Cardiovascular system: S1 & S2 heard, RRR. No JVD, murmurs, rubs, gallops or clicks. No pedal edema. Gastrointestinal system: Abdomen is nondistended, soft and nontender. No organomegaly or masses felt. Normal bowel sounds heard. Left arm: has thrill    Data Reviewed: I have personally reviewed following labs and imaging studies  CBC:  Recent Labs Lab 01/21/16 0351  WBC 7.2  NEUTROABS 2.8  HGB 11.5*  HCT 36.1  MCV 92.1  PLT 977   Basic Metabolic Panel:  Recent Labs Lab 01/21/16 0351  NA 137  K 4.9  CL 95*  CO2 23  GLUCOSE 87  BUN 71*  CREATININE 19.63*  CALCIUM 9.9   GFR: Estimated Creatinine Clearance:  4.2 mL/min (by C-G formula based on SCr of 19.63 mg/dL (H)). Liver Function Tests: No results for input(s): AST, ALT, ALKPHOS, BILITOT, PROT, ALBUMIN in the last 168 hours. No results for input(s): LIPASE, AMYLASE in the last 168 hours. No results for input(s): AMMONIA in the last 168 hours. Coagulation Profile: No results for input(s): INR, PROTIME in the last 168 hours. Cardiac Enzymes: No results for input(s): CKTOTAL, CKMB, CKMBINDEX, TROPONINI in the last 168 hours. BNP (last 3 results) No results for input(s): PROBNP in the last 8760 hours. HbA1C: No results for input(s): HGBA1C in the last 72 hours. CBG: No results for input(s): GLUCAP in the last 168 hours. Lipid Profile: No results for input(s): CHOL, HDL, LDLCALC, TRIG, CHOLHDL, LDLDIRECT in the last 72 hours. Thyroid Function Tests: No results for input(s): TSH, T4TOTAL, FREET4, T3FREE, THYROIDAB in the last  72 hours. Anemia Panel: No results for input(s): VITAMINB12, FOLATE, FERRITIN, TIBC, IRON, RETICCTPCT in the last 72 hours. Urine analysis:    Component Value Date/Time   COLORURINE YELLOW 12/20/2013 0530   APPEARANCEUR CLOUDY (A) 12/20/2013 0530   LABSPEC 1.009 12/20/2013 0530   PHURINE 8.0 12/20/2013 0530   GLUCOSEU 100 (A) 12/20/2013 0530   HGBUR LARGE (A) 12/20/2013 0530   BILIRUBINUR NEGATIVE 12/20/2013 0530   KETONESUR NEGATIVE 12/20/2013 0530   PROTEINUR 100 (A) 12/20/2013 0530   UROBILINOGEN 0.2 12/20/2013 0530   NITRITE NEGATIVE 12/20/2013 0530   LEUKOCYTESUR TRACE (A) 12/20/2013 0530     ) Recent Results (from the past 240 hour(s))  Blood culture (routine x 2)     Status: None (Preliminary result)   Collection Time: 01/21/16  3:43 AM  Result Value Ref Range Status   Specimen Description BLOOD RIGHT ARM  Final   Special Requests IN PEDIATRIC BOTTLE 4ML  Final   Culture  Setup Time   Final    AEROBIC BOTTLE ONLY GRAM POSITIVE COCCI IN CLUSTERS CRITICAL VALUE NOTED.  VALUE IS CONSISTENT WITH PREVIOUSLY REPORTED AND CALLED VALUE.    Culture GRAM POSITIVE COCCI  Final   Report Status PENDING  Incomplete  Blood culture (routine x 2)     Status: Abnormal (Preliminary result)   Collection Time: 01/21/16  4:00 AM  Result Value Ref Range Status   Specimen Description BLOOD RIGHT HAND  Final   Special Requests BOTTLES DRAWN AEROBIC AND ANAEROBIC 5ML  Final   Culture  Setup Time   Final    IN BOTH AEROBIC AND ANAEROBIC BOTTLES GRAM POSITIVE COCCI IN CLUSTERS CRITICAL RESULT CALLED TO, READ BACK BY AND VERIFIED WITH: J LEDFORD PHARMD 2310 01/21/16 A BROWNING    Culture STAPHYLOCOCCUS SPECIES (COAGULASE NEGATIVE) (A)  Final   Report Status PENDING  Incomplete  Blood Culture ID Panel (Reflexed)     Status: Abnormal   Collection Time: 01/21/16  4:00 AM  Result Value Ref Range Status   Enterococcus species NOT DETECTED NOT DETECTED Final   Listeria monocytogenes NOT  DETECTED NOT DETECTED Final   Staphylococcus species DETECTED (A) NOT DETECTED Final    Comment: CRITICAL RESULT CALLED TO, READ BACK BY AND VERIFIED WITH: J LEDFORD PHARMD 2310 01/21/16 A BROWNING    Staphylococcus aureus NOT DETECTED NOT DETECTED Final   Methicillin resistance NOT DETECTED NOT DETECTED Final   Streptococcus species NOT DETECTED NOT DETECTED Final   Streptococcus agalactiae NOT DETECTED NOT DETECTED Final   Streptococcus pneumoniae NOT DETECTED NOT DETECTED Final   Streptococcus pyogenes NOT DETECTED NOT DETECTED Final   Acinetobacter  baumannii NOT DETECTED NOT DETECTED Final   Enterobacteriaceae species NOT DETECTED NOT DETECTED Final   Enterobacter cloacae complex NOT DETECTED NOT DETECTED Final   Escherichia coli NOT DETECTED NOT DETECTED Final   Klebsiella oxytoca NOT DETECTED NOT DETECTED Final   Klebsiella pneumoniae NOT DETECTED NOT DETECTED Final   Proteus species NOT DETECTED NOT DETECTED Final   Serratia marcescens NOT DETECTED NOT DETECTED Final   Haemophilus influenzae NOT DETECTED NOT DETECTED Final   Neisseria meningitidis NOT DETECTED NOT DETECTED Final   Pseudomonas aeruginosa NOT DETECTED NOT DETECTED Final   Candida albicans NOT DETECTED NOT DETECTED Final   Candida glabrata NOT DETECTED NOT DETECTED Final   Candida krusei NOT DETECTED NOT DETECTED Final   Candida parapsilosis NOT DETECTED NOT DETECTED Final   Candida tropicalis NOT DETECTED NOT DETECTED Final      Anti-infectives    Start     Dose/Rate Route Frequency Ordered Stop   01/23/16 1200  vancomycin (VANCOCIN) IVPB 1000 mg/200 mL premix     1,000 mg 200 mL/hr over 60 Minutes Intravenous Every T-Th-Sa (Hemodialysis) 01/22/16 1057     01/21/16 2330  vancomycin (VANCOCIN) IVPB 1000 mg/200 mL premix     1,000 mg 200 mL/hr over 60 Minutes Intravenous  Once 01/21/16 2321 01/22/16 0135   01/21/16 0345  vancomycin (VANCOCIN) IVPB 1000 mg/200 mL premix  Status:  Discontinued     1,000 mg 200  mL/hr over 60 Minutes Intravenous  Once 01/21/16 0336 01/21/16 3748       Radiology Studies: No results found.      Scheduled Meds: . cinacalcet  30 mg Oral Q breakfast  . feeding supplement (NEPRO CARB STEADY)  237 mL Oral BID BM  . gabapentin  300 mg Oral BID  . heparin  5,000 Units Subcutaneous Q8H  . pantoprazole  40 mg Oral Daily  . sevelamer carbonate  2,400 mg Oral TID WC  . sodium bicarbonate  325 mg Oral BID  . [START ON 01/23/2016] vancomycin  1,000 mg Intravenous Q T,Th,Sa-HD   Continuous Infusions:   LOS: 0 days    Time spent: 35 min    [place], DO [facility] Pager [phone]  If 7PM-7AM, please contact night-coverage [email or web address] Password [email or web address] 01/22/2016, 12:03 PM

## 2016-01-22 NOTE — Progress Notes (Signed)
   VASCULAR SURGERY ASSESSMENT & PLAN:  Blood culture has gram positive cocci. Now on Vancomycin. There is no prosthetic material in the left arm, so I think that it is reasonable to try to treat with IV antibiotics. The area that is tender is not erythematous. There is no real induration or fluctuance. The only alternative would be to remove the aneurysm and sacrifice the graft.   SUBJECTIVE: Left forearm feels better  PHYSICAL EXAM: Vitals:   01/21/16 1925 01/21/16 2142 01/22/16 0528 01/22/16 0736  BP: (!) 147/79 (!) 154/89 131/78 138/77  Pulse: 89 84 90 96  Resp:  18 18 16   Temp:  98.6 F (37 C) 98.6 F (37 C) 98.9 F (37.2 C)  TempSrc:  Oral Oral Oral  SpO2:  100% 97% 96%  Weight:      Height:       Good thrill in left RC AVF.  LABS: Lab Results  Component Value Date   WBC 7.2 01/21/2016   HGB 11.5 (L) 01/21/2016   HCT 36.1 01/21/2016   MCV 92.1 01/21/2016   PLT 197 01/21/2016   Lab Results  Component Value Date   CREATININE 19.63 (H) 01/21/2016   Lab Results  Component Value Date   INR 1.09 03/29/2015   CBG (last 3)  No results for input(s): GLUCAP in the last 72 hours.  Principal Problem:   Left arm pain Active Problems:   ESRD (end stage renal disease) ([place])   Anemia of chronic kidney failure   Chronic pain    Gae Gallop Beeper: [phone] 01/22/2016

## 2016-01-22 NOTE — Progress Notes (Signed)
Pt. Asking if she is getting dialysis MD paged

## 2016-01-22 NOTE — Progress Notes (Signed)
Patient arrived to unit per bed.  Reviewed treatment plan and this RN agrees.  Report received from bedside RN, Junie Panning.  Consent obtained.  Patient A & O X 4. Lung sounds clear to ausculation in all fields. Generalized edema. Cardiac: Normal rate.  Prepped LLAVF with button hole access with alcohol and cannulated with two 15 gauge button hole needles.  Pulsation of blood noted.  Flushed access well with saline per protocol.  Connected and secured lines and initiated tx at 1259.  UF goal of 1500 mL and net fluid removal of 1000 mL.  Will continue to monitor.

## 2016-01-23 DIAGNOSIS — M79632 Pain in left forearm: Secondary | ICD-10-CM | POA: Diagnosis present

## 2016-01-23 DIAGNOSIS — I120 Hypertensive chronic kidney disease with stage 5 chronic kidney disease or end stage renal disease: Secondary | ICD-10-CM | POA: Diagnosis present

## 2016-01-23 DIAGNOSIS — M898X9 Other specified disorders of bone, unspecified site: Secondary | ICD-10-CM | POA: Diagnosis present

## 2016-01-23 DIAGNOSIS — R7881 Bacteremia: Secondary | ICD-10-CM | POA: Diagnosis present

## 2016-01-23 DIAGNOSIS — Z7722 Contact with and (suspected) exposure to environmental tobacco smoke (acute) (chronic): Secondary | ICD-10-CM | POA: Diagnosis present

## 2016-01-23 DIAGNOSIS — T82868A Thrombosis of vascular prosthetic devices, implants and grafts, initial encounter: Secondary | ICD-10-CM | POA: Diagnosis present

## 2016-01-23 DIAGNOSIS — Z833 Family history of diabetes mellitus: Secondary | ICD-10-CM | POA: Diagnosis not present

## 2016-01-23 DIAGNOSIS — Z8249 Family history of ischemic heart disease and other diseases of the circulatory system: Secondary | ICD-10-CM | POA: Diagnosis not present

## 2016-01-23 DIAGNOSIS — D631 Anemia in chronic kidney disease: Secondary | ICD-10-CM | POA: Diagnosis present

## 2016-01-23 DIAGNOSIS — Y832 Surgical operation with anastomosis, bypass or graft as the cause of abnormal reaction of the patient, or of later complication, without mention of misadventure at the time of the procedure: Secondary | ICD-10-CM | POA: Diagnosis present

## 2016-01-23 DIAGNOSIS — K219 Gastro-esophageal reflux disease without esophagitis: Secondary | ICD-10-CM | POA: Diagnosis present

## 2016-01-23 DIAGNOSIS — Z9115 Patient's noncompliance with renal dialysis: Secondary | ICD-10-CM | POA: Diagnosis not present

## 2016-01-23 DIAGNOSIS — N186 End stage renal disease: Secondary | ICD-10-CM | POA: Diagnosis not present

## 2016-01-23 DIAGNOSIS — N2581 Secondary hyperparathyroidism of renal origin: Secondary | ICD-10-CM | POA: Diagnosis present

## 2016-01-23 DIAGNOSIS — Z79899 Other long term (current) drug therapy: Secondary | ICD-10-CM | POA: Diagnosis not present

## 2016-01-23 DIAGNOSIS — M549 Dorsalgia, unspecified: Secondary | ICD-10-CM | POA: Diagnosis present

## 2016-01-23 DIAGNOSIS — Z992 Dependence on renal dialysis: Secondary | ICD-10-CM | POA: Diagnosis not present

## 2016-01-23 DIAGNOSIS — M79602 Pain in left arm: Secondary | ICD-10-CM | POA: Diagnosis not present

## 2016-01-23 DIAGNOSIS — G8929 Other chronic pain: Secondary | ICD-10-CM | POA: Diagnosis not present

## 2016-01-23 DIAGNOSIS — B957 Other staphylococcus as the cause of diseases classified elsewhere: Secondary | ICD-10-CM | POA: Diagnosis present

## 2016-01-23 LAB — CULTURE, BLOOD (ROUTINE X 2)

## 2016-01-23 MED ORDER — CEFAZOLIN SODIUM-DEXTROSE 2-4 GM/100ML-% IV SOLN
2.0000 g | INTRAVENOUS | Status: DC
  Administered 2016-01-23: 2 g via INTRAVENOUS
  Filled 2016-01-23 (×2): qty 100

## 2016-01-23 MED ORDER — OXYCODONE HCL 5 MG PO TABS
ORAL_TABLET | ORAL | Status: AC
  Administered 2016-01-23: 5 mg via ORAL
  Filled 2016-01-23: qty 1

## 2016-01-23 MED ORDER — OXYCODONE HCL 10 MG PO TABS: 10.0000 mg | ORAL_TABLET | Freq: Every day | ORAL | 0 refills | Status: DC

## 2016-01-23 MED ORDER — ORAL CARE MOUTH RINSE
15.0000 mL | Freq: Two times a day (BID) | OROMUCOSAL | Status: DC
  Administered 2016-01-23: 15 mL via OROMUCOSAL

## 2016-01-23 MED ORDER — CEFAZOLIN SODIUM-DEXTROSE 2-4 GM/100ML-% IV SOLN: 2.0000 g | INTRAVENOUS | Status: DC

## 2016-01-23 NOTE — Consult Note (Signed)
[facility] Renal Consultation Note  Indication for Consultation:  Management of ESRD/hemodialysis; anemia, hypertension/volume and secondary hyperparathyroidism  HPI: Desiree [name] is a 40 y.o. female with ESRD Chronic HD on TTS ([place])  She has a history Noncompliance attending out pt HD txs missing 3 txs in  past 8 txs and has not attended full tx time in past 2 weeks. She was initially admitted under observation 01/21/16  Now full admit  for left arm swelling and pain at her AVF.   Blood cultures + for Coag negative Staph now on IV Vancomycin. Duplex of AVF consistent with partial thrombus of cephalic vein outflow.  She was seen by Dr. Scot Dock who recommended IV abx and possible revision if this does not improve.  Currently reported pain resolved since on antibiotics.For HD today and dc home after hd   On  IV Vanc/Ancef (until final sensitivites return) for 6 weeks   Past Medical History:  Diagnosis Date  . Childhood asthma   . Complication of anesthesia    "sometimes it does not work; didn't during [place]" (01/21/2016)  . ESRD (end stage renal disease) on dialysis ([place])    "TTS; [facility]; [facility]" (01/21/2016)  . GERD (gastroesophageal reflux disease)    nexium prn  . Gout   . History of blood transfusion    "related to kidneys; I've had 4" (01/21/2016)  . Hypertension   . Migraine    "qod now" (01/21/2016)  . Preterm labor ~ 2014  . Renal insufficiency   . Seizures ([place])    "last one was in 2000; related to preeclampsia" (01/21/2016)    Past Surgical History:  Procedure Laterality Date  . AV FISTULA PLACEMENT Left 2010  . CERVICAL BIOPSY  W/ LOOP ELECTRODE EXCISION  2001  . DILATION AND EVACUATION  08/02/2011   Procedure: DILATATION AND EVACUATION;  Surgeon: Logan Bores, MD;  Location: [facility];  Service: Gynecology;;  . Brigitte Pulse AND EVACUATION N/A 08/31/2013   Procedure: DILATATION AND EVACUATION;  Surgeon: Woodroe Mode, MD;  Location:  [facility];  Service: Gynecology;  Laterality: N/A;  . RENAL BIOPSY    . REVISION OF ARTERIOVENOUS GORETEX GRAFT Left 02/11/2013   Procedure: REVISION OF ARTERIOVENOUS GORTEX FISTULA;  Surgeon: Rosetta Posner, MD;  Location: [facility];  Service: Vascular;  Laterality: Left;      Family History  Problem Relation Age of Onset  . Diabetes Mother   . Hyperlipidemia Mother   . Hypertension Mother   . Heart disease Mother   . Hypertension    . Diabetes type II        reports that she is a non-smoker but has been exposed to tobacco smoke. She has never used smokeless tobacco. She reports that she uses drugs, including Marijuana. She reports that she does not drink alcohol.   Allergies  Allergen Reactions  . Prednisone Other (See Comments)    Other reaction(s): Other (See Comments) Muscle spasms Patient says prednisone causes her to cramp all over, muscle spasms uncontrolled  . Geralyn Flash [Fish Allergy] Itching, Swelling, Rash and Other (See Comments)    Face droops also    Prior to Admission medications   Medication Sig Start Date End Date Taking? Authorizing Provider  cinacalcet (SENSIPAR) 30 MG tablet Take 30 mg by mouth daily.   Yes Historical Provider, MD  gabapentin (NEURONTIN) 100 MG capsule Take 300 mg by mouth 2 (two) times daily.    Yes Historical Provider, MD  pantoprazole (PROTONIX) 40 MG tablet Take 40 mg by mouth daily.   Yes Historical Provider, MD  sevelamer carbonate (RENVELA) 800 MG tablet Take 2,400 mg by mouth 3 (three) times daily with meals.   Yes Historical Provider, MD  sodium bicarbonate 325 MG tablet Take 325 mg by mouth 2 (two) times daily.   Yes Historical Provider, MD  ceFAZolin (ANCEF) 2-4 GM/100ML-% IVPB Inject 100 mLs (2 g total) into the vein Every Tuesday,Thursday,and Saturday with dialysis. 01/23/16   Geradine Girt, DO  Oxycodone HCl 10 MG TABS Take 1 tablet (10 mg total) by mouth daily. 01/23/16   Geradine Girt, DO     Anti-infectives    Start     Dose/Rate Route  Frequency Ordered Stop   01/23/16 1200  vancomycin (VANCOCIN) IVPB 1000 mg/200 mL premix  Status:  Discontinued     1,000 mg 200 mL/hr over 60 Minutes Intravenous Every T-Th-Sa (Hemodialysis) 01/22/16 1057 01/23/16 1029   01/23/16 1200  ceFAZolin (ANCEF) IVPB 2g/100 mL premix     2 g 200 mL/hr over 30 Minutes Intravenous Every T-Th-Sa (Hemodialysis) 01/23/16 1029     01/23/16 0000  ceFAZolin (ANCEF) 2-4 GM/100ML-% IVPB    Comments:  AFTER DIALYSIS x 6 weeks   2 g 200 mL/hr over 30 Minutes Intravenous Every T-Th-Sa (Hemodialysis) 01/23/16 1035     01/21/16 2330  vancomycin (VANCOCIN) IVPB 1000 mg/200 mL premix     1,000 mg 200 mL/hr over 60 Minutes Intravenous  Once 01/21/16 2321 01/22/16 0135   01/21/16 0345  vancomycin (VANCOCIN) IVPB 1000 mg/200 mL premix  Status:  Discontinued     1,000 mg 200 mL/hr over 60 Minutes Intravenous  Once 01/21/16 0336 01/21/16 5956      Results for orders placed or performed during the hospital encounter of 01/21/16 (from the past 48 hour(s))  MRSA PCR Screening     Status: None   Collection Time: 01/22/16 11:01 AM  Result Value Ref Range   MRSA by PCR NEGATIVE NEGATIVE    Comment:        The GeneXpert MRSA Assay (FDA approved for NASAL specimens only), is one component of a comprehensive MRSA colonization surveillance program. It is not intended to diagnose MRSA infection nor to guide or monitor treatment for MRSA infections.   CBC     Status: Abnormal   Collection Time: 01/22/16 12:51 PM  Result Value Ref Range   WBC 6.5 4.0 - 10.5 K/uL   RBC 3.63 (L) 3.87 - 5.11 MIL/uL   Hemoglobin 10.6 (L) 12.0 - 15.0 g/dL   HCT 33.0 (L) 36.0 - 46.0 %   MCV 90.9 78.0 - 100.0 fL   MCH 29.2 26.0 - 34.0 pg   MCHC 32.1 30.0 - 36.0 g/dL   RDW 16.7 (H) 11.5 - 15.5 %   Platelets 202 150 - 400 K/uL  Renal function panel     Status: Abnormal   Collection Time: 01/22/16 12:51 PM  Result Value Ref Range   Sodium 136 135 - 145 mmol/L   Potassium 5.7 (H) 3.5  - 5.1 mmol/L   Chloride 93 (L) 101 - 111 mmol/L   CO2 25 22 - 32 mmol/L   Glucose, Bld 95 65 - 99 mg/dL   BUN 82 (H) 6 - 20 mg/dL   Creatinine, Ser 22.60 (H) 0.44 - 1.00 mg/dL   Calcium 9.1 8.9 - 10.3 mg/dL   Phosphorus 7.5 (H) 2.5 - 4.6 mg/dL   Albumin 3.4 (L) 3.5 -  5.0 g/dL   GFR calc non Af Amer 2 (L) >60 mL/min   GFR calc Af Amer 2 (L) >60 mL/min    Comment: (NOTE) The eGFR has been calculated using the CKD EPI equation. This calculation has not been validated in all clinical situations. eGFR's persistently <60 mL/min signify possible Chronic Kidney Disease.    Anion gap 18 (H) 5 - 15     ROS: Noted on admit significant AVF pain did not improve with taking oxycodone 10 mg ,swelling of arm and Headaches with all other  Reported negatives on ROS.   Physical Exam: Vitals:   01/23/16 0507 01/23/16 1124  BP: 126/79 140/88  Pulse: 89 94  Resp: 18 16  Temp: 98.9 F (37.2 C) 98.2 F (36.8 C)     General: alert, Obese AAF, NAD ,OX3 HEENT: [place], MMM, EOMI Neck: no jvd , supple Heart: RRR, no mur, rub or gallop Lungs: CTA  Abdomen: Obese, soft , NT, ND  Extremities: no pedal edema Skin: no overt rash Neuro: alert OX4, no overt neuro deficits  Dialysis Access: Pos bruit L FA AVF with proximal swelling aneurysmal area  Dialysis Orders: [facility]   on TTS . EDW 88.5 HD Bath 2k, 2.25ca  Time 3hr 57mn Heparin 8500 bolus 1000 intermittent . Access LFA AVF       Hectorol 11 mcg IV/HD    NO ESA  Other  Op labs hgb 12.2 ( 01/11/16)   Ca 9.6 phos 7.9 pth 389  Assessment/Plan  1. Coag neg staph  + blood Cultures -plan for IV Vanc/Ancef (until final sensitivites return) for 6 weeks   2. ESRD -  HD  TTS on schedule  3. HD Access - noted mild Cellulitic  appearance aneurysmal area on admit  Resolved with antibiotics / has FU with  Dr. DScot Dockin [place] he dw pt. 4. Hypertension/volume  -  Fu post wt and bp  Today  May need lower edw  No excess vol. On exam  But decr appetite  with Bacteremia  5. Anemia  - hgb 10.6  No esa  6. Metabolic bone disease -  Iv hectorol on hd  And binders 7. Noncompliance with HD txs- Have dw her importance of attending HD with need for Antibiotics with risk of Endocarditis   DErnest Haber PA-C [id number]/11/2015, 11:33 AM    I have seen and examined this patient and agree with plan and assessment in the above note with renal recommendations/intervention highlighted. I stressed the importance of compliance with HD especially now that she is to receive IV abx 3 times/week to treat the bacteremia and prevent the development of endocarditis.  She acknowledged the importance.  JGovernor RooksColadonato,MD 01/23/2016 12:32 PM

## 2016-01-23 NOTE — Progress Notes (Signed)
Pharmacy Antibiotic Note  Desiree [name] is a 40 y.o. female admitted on 01/21/2016 with fistula pain.  Pharmacy has been consulted for Vancomycin dosing for bacteremia. BCID + for staph species. Pt has ESRD on HD and is here with pain at fistula site. Usually TTS HD. BCx with sensitive staph lugdunesis.  Plan: d/c vanc Start cefazolin 2 grams IV q HD on TTS Monitor clinical progress, c/s, renal function, abx plan/LOT   Height: 5\' 5"  (165.1 cm) Weight: 193 lb 2 oz (87.6 kg) IBW/kg (Calculated) : 57  Temp (24hrs), Avg:99 F (37.2 C), Min:98.2 F (36.8 C), Max:99.7 F (37.6 C)   Recent Labs Lab 01/21/16 0351 01/21/16 0358 01/22/16 1251  WBC 7.2  --  6.5  CREATININE 19.63*  --  22.60*  LATICACIDVEN  --  0.96  --     Estimated Creatinine Clearance: 3.7 mL/min (by C-G formula based on SCr of 22.6 mg/dL (H)).    Allergies  Allergen Reactions  . Prednisone Other (See Comments)    Other reaction(s): Other (See Comments) Muscle spasms Patient says prednisone causes her to cramp all over, muscle spasms uncontrolled  . Geralyn Flash [Fish Allergy] Itching, Swelling, Rash and Other (See Comments)    Face droops also     Thank you for allowing [place] to participate in this patients care. Jens Som, PharmD Pager: [phone] 01/23/2016 10:25 AM

## 2016-01-23 NOTE — Procedures (Signed)
I was present at this dialysis session. I have reviewed the session itself and made appropriate changes. She has blood cultures + for coag neg staph and plan for IV Vanc/Ancef (until final sensitivites return) for 6 weeks.  I stressed the importance of compliance with HD so she gets the abx 3 times per week to prevent the development of endocarditis.  Filed Weights   01/22/16 1242 01/22/16 1644 01/23/16 1124  Weight: 87.7 kg (193 lb 5.5 oz) 87.6 kg (193 lb 2 oz) 89.6 kg (197 lb 8.5 oz)     Recent Labs Lab 01/22/16 1251  NA 136  K 5.7*  CL 93*  CO2 25  GLUCOSE 95  BUN 82*  CREATININE 22.60*  CALCIUM 9.1  PHOS 7.5*     Recent Labs Lab 01/21/16 0351 01/22/16 1251  WBC 7.2 6.5  NEUTROABS 2.8  --   HGB 11.5* 10.6*  HCT 36.1 33.0*  MCV 92.1 90.9  PLT 197 202    Scheduled Meds: .  ceFAZolin (ANCEF) IV  2 g Intravenous Q T,Th,Sa-HD  . cinacalcet  30 mg Oral Q breakfast  . feeding supplement (NEPRO CARB STEADY)  237 mL Oral BID BM  . gabapentin  300 mg Oral BID  . heparin  5,000 Units Subcutaneous Q8H  . mouth rinse  15 mL Mouth Rinse BID  . pantoprazole  40 mg Oral Daily  . sevelamer carbonate  2,400 mg Oral TID WC  . sodium bicarbonate  325 mg Oral BID   Continuous Infusions: PRN Meds:.acetaminophen **OR** acetaminophen, diphenhydrAMINE, ondansetron **OR** ondansetron (ZOFRAN) IV, oxyCODONE-acetaminophen **AND** oxyCODONE, senna-docusate   Donetta Potts,  MD 01/23/2016, 11:30 AM

## 2016-01-23 NOTE — Progress Notes (Signed)
[name] to be D/C'd Home per MD order.  Discussed with the patient and all questions fully answered.  VSS, Skin clean, dry and intact without evidence of skin break down, no evidence of skin tears noted. IV catheter discontinued intact. Site without signs and symptoms of complications. Dressing and pressure applied.  An After Visit Summary was printed and given to the patient. Patient received prescription.  D/c education completed with patient/family including follow up instructions, medication list, d/c activities limitations if indicated, with other d/c instructions as indicated by MD - patient able to verbalize understanding, all questions fully answered.   Patient instructed to return to ED, call 911, or call MD for any changes in condition.   Patient escorted via [place], and D/C home via private auto.  Luci Bank 01/23/2016 5:37 PM

## 2016-01-23 NOTE — Discharge Summary (Signed)
Physician Discharge Summary  Desiree [name] YPP:[id number] DOB: [birth year] DOA: 01/21/2016  PCP: Windy Kalata, MD  Admit date: 01/21/2016 Discharge date: 01/23/2016   Recommendations for Outpatient Follow-Up:   Follow up on pending blood cultures 6 weeks of IV ancef  Discharge Diagnosis:   Principal Problem:   Left arm pain Active Problems:   ESRD (end stage renal disease) ([place])   Anemia of chronic kidney failure   Chronic pain   Discharge disposition:  Home.   Discharge Condition: Improved.  Diet recommendation: renal  Wound care: None.   History of Present Illness:   Desiree [name] is a 40 y.o. female with a past medical history significant for ESRD on HD TThS and history of fistula aneurysm who presents with pain and swelling of her fistula for two days.  She was in her normal state of health until this past Sunday when she woke with new swelling and pain of the fistula in her left forearm.  The pain was achy, constant, severe in intensity, and radiated into the first 3 fingers of the left hand, which felt cold to her, and felt better if she rubs them. The pain did not improve with taking oxycodone 10 mg which she had at home, so today she came to the emergency room.    Hospital Course by Problem:   Gram + bacteremia 2/2-- staph coag negative -repeat blood cultures done 11/9-- pending-- patient has pain appointment in AM in [place] and does not want to stay in hospital to follow blood cultures -ID consult appreciated-- 6 weeks ancef-- no further work up if repeat cultures negative  ESRD -on HD t/th/sat -getting HD today  PAINFUL LEFT RADIOCEPHALIC AV FISTULA -seen by vascular -U/S done: left radio-cephalic arteriovenous fistula is patent. 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    Medical Consultants:   Renal ID  Discharge Exam:   Vitals:     11 /08/17 2309 01/23/16 0507  BP: (!) 145/88 126/79  Pulse: 88 89  Resp: 18 18  Temp:  98.9 F (37.2 C)   Vitals:   01/22/16 1709 01/22/16 2248 01/22/16 2309 01/23/16 0507  BP: (!) 143/86 (!) 170/89 (!) 145/88 126/79  Pulse: 89 87 88 89  Resp:  18 18 18   Temp: 99.3 F (37.4 C) 99.7 F (37.6 C)  98.9 F (37.2 C)  TempSrc: Oral Oral  Oral  SpO2: 100% 100% 100% 99%  Weight:      Height:        Gen:  NAD    The results of significant diagnostics from this hospitalization (including imaging, microbiology, ancillary and laboratory) are listed below for reference.     Procedures and Diagnostic Studies:   No results found.   Labs:   Basic Metabolic Panel:  Recent Labs Lab 01/21/16 0351 01/22/16 1251  NA 137 136  K 4.9 5.7*  CL 95* 93*  CO2 23 25  GLUCOSE 87 95  BUN 71* 82*  CREATININE 19.63* 22.60*  CALCIUM 9.9 9.1  PHOS  --  7.5*   GFR Estimated Creatinine Clearance: 3.7 mL/min (by C-G formula based on SCr of 22.6 mg/dL (H)). Liver Function Tests:  Recent Labs Lab 01/22/16 1251  ALBUMIN 3.4*   No results for input(s): LIPASE, AMYLASE in the last 168 hours. No results for input(s): AMMONIA in the last 168 hours. Coagulation profile No results for input(s): INR, PROTIME in the  last 168 hours.  CBC:  Recent Labs Lab 01/21/16 0351 01/22/16 1251  WBC 7.2 6.5  NEUTROABS 2.8  --   HGB 11.5* 10.6*  HCT 36.1 33.0*  MCV 92.1 90.9  PLT 197 202   Cardiac Enzymes: No results for input(s): CKTOTAL, CKMB, CKMBINDEX, TROPONINI in the last 168 hours. BNP: Invalid input(s): POCBNP CBG: No results for input(s): GLUCAP in the last 168 hours. D-Dimer No results for input(s): DDIMER in the last 72 hours. Hgb A1c No results for input(s): HGBA1C in the last 72 hours. Lipid Profile No results for input(s): CHOL, HDL, LDLCALC, TRIG, CHOLHDL, LDLDIRECT in the last 72 hours. Thyroid function studies No results for input(s): TSH, T4TOTAL, T3FREE, THYROIDAB in  the last 72 hours.  Invalid input(s): FREET3 Anemia work up No results for input(s): VITAMINB12, FOLATE, FERRITIN, TIBC, IRON, RETICCTPCT in the last 72 hours. Microbiology Recent Results (from the past 240 hour(s))  Blood culture (routine x 2)     Status: Abnormal   Collection Time: 01/21/16  3:43 AM  Result Value Ref Range Status   Specimen Description BLOOD RIGHT ARM  Final   Special Requests IN PEDIATRIC BOTTLE 4ML  Final   Culture  Setup Time   Final    AEROBIC BOTTLE ONLY GRAM POSITIVE COCCI IN CLUSTERS CRITICAL VALUE NOTED.  VALUE IS CONSISTENT WITH PREVIOUSLY REPORTED AND CALLED VALUE.    Culture STAPHYLOCOCCUS LUGDUNENSIS (A)  Final   Report Status 01/23/2016 FINAL  Final   Organism ID, Bacteria STAPHYLOCOCCUS LUGDUNENSIS  Final      Susceptibility   Staphylococcus lugdunensis - MIC*    CIPROFLOXACIN <=0.5 SENSITIVE Sensitive     ERYTHROMYCIN >=8 RESISTANT Resistant     GENTAMICIN <=0.5 SENSITIVE Sensitive     OXACILLIN 1 SENSITIVE Sensitive     TETRACYCLINE >=16 RESISTANT Resistant     VANCOMYCIN <=0.5 SENSITIVE Sensitive     TRIMETH/SULFA <=10 SENSITIVE Sensitive     CLINDAMYCIN <=0.25 RESISTANT Resistant     RIFAMPIN <=0.5 SENSITIVE Sensitive     Inducible Clindamycin POSITIVE Resistant     * STAPHYLOCOCCUS LUGDUNENSIS  Blood culture (routine x 2)     Status: Abnormal   Collection Time: 01/21/16  4:00 AM  Result Value Ref Range Status   Specimen Description BLOOD RIGHT HAND  Final   Special Requests BOTTLES DRAWN AEROBIC AND ANAEROBIC 5ML  Final   Culture  Setup Time   Final    IN BOTH AEROBIC AND ANAEROBIC BOTTLES GRAM POSITIVE COCCI IN CLUSTERS CRITICAL RESULT CALLED TO, READ BACK BY AND VERIFIED WITH: J LEDFORD PHARMD 2310 01/21/16 A BROWNING    Culture (A)  Final    STAPHYLOCOCCUS LUGDUNENSIS SUSCEPTIBILITIES PERFORMED ON PREVIOUS CULTURE WITHIN THE LAST 5 DAYS.    Report Status 01/23/2016 FINAL  Final  Blood Culture ID Panel (Reflexed)     Status:  Abnormal   Collection Time: 01/21/16  4:00 AM  Result Value Ref Range Status   Enterococcus species NOT DETECTED NOT DETECTED Final   Listeria monocytogenes NOT DETECTED NOT DETECTED Final   Staphylococcus species DETECTED (A) NOT DETECTED Final    Comment: CRITICAL RESULT CALLED TO, READ BACK BY AND VERIFIED WITH: J LEDFORD PHARMD 2310 01/21/16 A BROWNING    Staphylococcus aureus NOT DETECTED NOT DETECTED Final   Methicillin resistance NOT DETECTED NOT DETECTED Final   Streptococcus species NOT DETECTED NOT DETECTED Final   Streptococcus agalactiae NOT DETECTED NOT DETECTED Final   Streptococcus pneumoniae NOT DETECTED NOT DETECTED Final  Streptococcus pyogenes NOT DETECTED NOT DETECTED Final   Acinetobacter baumannii NOT DETECTED NOT DETECTED Final   Enterobacteriaceae species NOT DETECTED NOT DETECTED Final   Enterobacter cloacae complex NOT DETECTED NOT DETECTED Final   Escherichia coli NOT DETECTED NOT DETECTED Final   Klebsiella oxytoca NOT DETECTED NOT DETECTED Final   Klebsiella pneumoniae NOT DETECTED NOT DETECTED Final   Proteus species NOT DETECTED NOT DETECTED Final   Serratia marcescens NOT DETECTED NOT DETECTED Final   Haemophilus influenzae NOT DETECTED NOT DETECTED Final   Neisseria meningitidis NOT DETECTED NOT DETECTED Final   Pseudomonas aeruginosa NOT DETECTED NOT DETECTED Final   Candida albicans NOT DETECTED NOT DETECTED Final   Candida glabrata NOT DETECTED NOT DETECTED Final   Candida krusei NOT DETECTED NOT DETECTED Final   Candida parapsilosis NOT DETECTED NOT DETECTED Final   Candida tropicalis NOT DETECTED NOT DETECTED Final  MRSA PCR Screening     Status: None   Collection Time: 01/22/16 11:01 AM  Result Value Ref Range Status   MRSA by PCR NEGATIVE NEGATIVE Final    Comment:        The GeneXpert MRSA Assay (FDA approved for NASAL specimens only), is one component of a comprehensive MRSA colonization surveillance program. It is not intended to  diagnose MRSA infection nor to guide or monitor treatment for MRSA infections.      Discharge Instructions:   Discharge Instructions    Diet - low sodium heart healthy    Complete by:  As directed    Discharge instructions    Complete by:  As directed    Will need abx after dialysis for 6 weeks Repeat blood cultures still pending-- will need further work up if positive   Increase activity slowly    Complete by:  As directed        Medication List    TAKE these medications   ceFAZolin 2-4 GM/100ML-% IVPB Commonly known as:  ANCEF Inject 100 mLs (2 g total) into the vein Every Tuesday,Thursday,and Saturday with dialysis.   cinacalcet 30 MG tablet Commonly known as:  SENSIPAR Take 30 mg by mouth daily.   gabapentin 100 MG capsule Commonly known as:  NEURONTIN Take 300 mg by mouth 2 (two) times daily.   Oxycodone HCl 10 MG Tabs Take 1 tablet (10 mg total) by mouth daily.   pantoprazole 40 MG tablet Commonly known as:  PROTONIX Take 40 mg by mouth daily.   sevelamer carbonate 800 MG tablet Commonly known as:  RENVELA Take 2,400 mg by mouth 3 (three) times daily with meals.   sodium bicarbonate 325 MG tablet Take 325 mg by mouth 2 (two) times daily.         Time coordinating discharge: 35 min  Signed:  Finnley Lewis U Debby Clyne   [facility] 01/23/2016, 10:36 AM

## 2016-01-23 NOTE — Progress Notes (Signed)
   VASCULAR SURGERY ASSESSMENT & PLAN:  Pain left forearm pain better.  Continue IV ABx as outpt.  I will arrange f/u as outpt  SUBJECTIVE: Pain left forearm is much better  PHYSICAL EXAM: Vitals:   01/22/16 1709 01/22/16 2248 01/22/16 2309 01/23/16 0507  BP: (!) 143/86 (!) 170/89 (!) 145/88 126/79  Pulse: 89 87 88 89  Resp:  18 18 18   Temp: 99.3 F (37.4 C) 99.7 F (37.6 C)  98.9 F (37.2 C)  TempSrc: Oral Oral  Oral  SpO2: 100% 100% 100% 99%  Weight:      Height:       Good thrill in left AVF  LABS: Lab Results  Component Value Date   WBC 6.5 01/22/2016   HGB 10.6 (L) 01/22/2016   HCT 33.0 (L) 01/22/2016   MCV 90.9 01/22/2016   PLT 202 01/22/2016   Lab Results  Component Value Date   CREATININE 22.60 (H) 01/22/2016   Lab Results  Component Value Date   INR 1.09 03/29/2015   CBG (last 3)  No results for input(s): GLUCAP in the last 72 hours.  Principal Problem:   Left arm pain Active Problems:   ESRD (end stage renal disease) ([place])   Anemia of chronic kidney failure   Chronic pain    Gae Gallop Beeper: [phone] 01/23/2016

## 2016-01-28 LAB — CULTURE, BLOOD (ROUTINE X 2)
CULTURE: NO GROWTH
Culture: NO GROWTH

## 2016-02-19 ENCOUNTER — Encounter: Payer: Self-pay | Admitting: Vascular Surgery

## 2016-02-26 ENCOUNTER — Ambulatory Visit: Payer: Medicare Other | Admitting: Vascular Surgery

## 2016-03-05 ENCOUNTER — Encounter: Payer: Self-pay | Admitting: Vascular Surgery

## 2016-03-07 ENCOUNTER — Emergency Department (HOSPITAL_COMMUNITY): Payer: Medicare Other

## 2016-03-07 ENCOUNTER — Non-Acute Institutional Stay (HOSPITAL_COMMUNITY)
Admission: EM | Admit: 2016-03-07 | Discharge: 2016-03-07 | Disposition: A | Discharge status: Home / Self Care | Payer: Medicare Other | Attending: Emergency Medicine | Admitting: Emergency Medicine

## 2016-03-07 ENCOUNTER — Encounter (HOSPITAL_COMMUNITY): Payer: Self-pay | Admitting: Emergency Medicine

## 2016-03-07 DIAGNOSIS — I120 Hypertensive chronic kidney disease with stage 5 chronic kidney disease or end stage renal disease: Secondary | ICD-10-CM | POA: Diagnosis not present

## 2016-03-07 DIAGNOSIS — E875 Hyperkalemia: Secondary | ICD-10-CM | POA: Insufficient documentation

## 2016-03-07 DIAGNOSIS — D649 Anemia, unspecified: Secondary | ICD-10-CM | POA: Insufficient documentation

## 2016-03-07 DIAGNOSIS — R0600 Dyspnea, unspecified: Secondary | ICD-10-CM | POA: Diagnosis present

## 2016-03-07 DIAGNOSIS — N186 End stage renal disease: Secondary | ICD-10-CM | POA: Diagnosis not present

## 2016-03-07 DIAGNOSIS — R0789 Other chest pain: Secondary | ICD-10-CM | POA: Diagnosis not present

## 2016-03-07 DIAGNOSIS — Z792 Long term (current) use of antibiotics: Secondary | ICD-10-CM | POA: Diagnosis not present

## 2016-03-07 DIAGNOSIS — K219 Gastro-esophageal reflux disease without esophagitis: Secondary | ICD-10-CM | POA: Diagnosis not present

## 2016-03-07 DIAGNOSIS — R0602 Shortness of breath: Secondary | ICD-10-CM | POA: Diagnosis present

## 2016-03-07 DIAGNOSIS — Z79899 Other long term (current) drug therapy: Secondary | ICD-10-CM | POA: Insufficient documentation

## 2016-03-07 DIAGNOSIS — Z992 Dependence on renal dialysis: Secondary | ICD-10-CM | POA: Insufficient documentation

## 2016-03-07 DIAGNOSIS — J810 Acute pulmonary edema: Secondary | ICD-10-CM | POA: Insufficient documentation

## 2016-03-07 LAB — BASIC METABOLIC PANEL
ANION GAP: 21 — AB (ref 5–15)
BUN: 72 mg/dL — AB (ref 6–20)
CO2: 20 mmol/L — ABNORMAL LOW (ref 22–32)
Calcium: 10 mg/dL (ref 8.9–10.3)
Chloride: 97 mmol/L — ABNORMAL LOW (ref 101–111)
Creatinine, Ser: 18 mg/dL — ABNORMAL HIGH (ref 0.44–1.00)
GFR, EST AFRICAN AMERICAN: 2 mL/min — AB (ref >60)
GFR, EST NON AFRICAN AMERICAN: 2 mL/min — AB (ref >60)
Glucose, Bld: 91 mg/dL (ref 65–99)
POTASSIUM: 5.9 mmol/L — AB (ref 3.5–5.1)
SODIUM: 138 mmol/L (ref 135–145)

## 2016-03-07 LAB — CBC WITH DIFFERENTIAL/PLATELET
BASOS ABS: 0 10*3/uL (ref 0.0–0.1)
BASOS PCT: 0 %
EOS ABS: 0.4 10*3/uL (ref 0.0–0.7)
EOS PCT: 5 %
HCT: 25.6 % — ABNORMAL LOW (ref 36.0–46.0)
Hemoglobin: 8.2 g/dL — ABNORMAL LOW (ref 12.0–15.0)
Lymphocytes Relative: 32 %
Lymphs Abs: 2.4 10*3/uL (ref 0.7–4.0)
MCH: 29.1 pg (ref 26.0–34.0)
MCHC: 32 g/dL (ref 30.0–36.0)
MCV: 90.8 fL (ref 78.0–100.0)
Monocytes Absolute: 0.4 10*3/uL (ref 0.1–1.0)
Monocytes Relative: 5 %
Neutro Abs: 4.5 10*3/uL (ref 1.7–7.7)
Neutrophils Relative %: 58 %
PLATELETS: 239 10*3/uL (ref 150–400)
RBC: 2.82 MIL/uL — AB (ref 3.87–5.11)
RDW: 17.5 % — ABNORMAL HIGH (ref 11.5–15.5)
WBC: 7.7 10*3/uL (ref 4.0–10.5)

## 2016-03-07 LAB — I-STAT TROPONIN, ED: TROPONIN I, POC: 0.04 ng/mL (ref 0.00–0.08)

## 2016-03-07 MED ORDER — IPRATROPIUM-ALBUTEROL 0.5-2.5 (3) MG/3ML IN SOLN
3.0000 mL | Freq: Once | RESPIRATORY_TRACT | Status: AC
  Administered 2016-03-07: 3 mL via RESPIRATORY_TRACT
  Filled 2016-03-07: qty 3

## 2016-03-07 MED ORDER — OXYCODONE HCL 5 MG PO TABS
5.0000 mg | ORAL_TABLET | Freq: Once | ORAL | Status: AC
Start: 2016-03-07 — End: 2016-03-07
  Administered 2016-03-07: 5 mg via ORAL
  Filled 2016-03-07: qty 1

## 2016-03-07 MED ORDER — CEFAZOLIN SODIUM-DEXTROSE 2-4 GM/100ML-% IV SOLN
2.0000 g | Freq: Once | INTRAVENOUS | Status: AC
  Administered 2016-03-07: 2 g via INTRAVENOUS
  Filled 2016-03-07 (×2): qty 100

## 2016-03-07 MED ORDER — AEROCHAMBER PLUS FLO-VU MEDIUM MISC
1.0000 | Freq: Once | Status: DC
  Filled 2016-03-07: qty 1

## 2016-03-07 NOTE — Progress Notes (Signed)
Pt completed HD tx without complications; had abdominal and back cramping which resulted in UF goal not being met. After completion of tx pt denies pain; SOB; dizziness, n/v; pt accompanied off the unit on a wheelchair to the ED by the unit secretary. Pt received ABT in HD.

## 2016-03-07 NOTE — ED Provider Notes (Signed)
[facility] DEPT Provider Note   CSN: [id number] Arrival date & time: 03/07/16  0757     History   Chief Complaint Chief Complaint  Patient presents with  . Shortness of Breath    HPI Desiree [name] is a 40 y.o. female.  HPI Desiree [name] is a 40 y.o. female with history of asthma, end-stage renal disease on dialysis, anemia, hypertension, migraines, presents to emergency department complaining of shortness of breath. Pt states her symptoms started approximately 5 days ago. Patient states her symptoms started out as a upper respiratory tract infection. She states that she has some visitors to the house who had sick kids last week. She reports nasal congestion, cough, fever up to 102 4 days ago. Since then she reports progressive worsening in shortness of breath and wheezing. States she went to urgent care 4 days ago and was given an inhaler. She states she has been using it and it has been helping. She states last night she used it 3 times, administering 3 puffs each time and had no relief. She states she was unable to sleep. She reports shortness of breath, cough, swelling in her arms. Patient receives dialysis on Tuesday, Thursday, Saturday, states last dialysis was on Saturday one week ago. She states she missed dialysis on Tuesday because she went to urgent care, and they rescheduled that for Wednesday. She states that during dialysis on Wednesday she developed cramping so they had to cut it short. She again missed dialysis Thursday and yesterday. She states she is supposed to go for dialysis today but instead came here because she is unable to breathe. She reports associated chest tightness. No nausea or vomiting. No swelling in legs. Reports weight loss in the last month, states" losing fluid because of dialysis."  Past Medical History:  Diagnosis Date  . Childhood asthma   . Complication of anesthesia    "sometimes it does not work; didn't during [place]" (01/21/2016)  . ESRD  (end stage renal disease) on dialysis ([place])    "TTS; [facility]; [facility]" (01/21/2016)  . GERD (gastroesophageal reflux disease)    nexium prn  . Gout   . History of blood transfusion    "related to kidneys; I've had 4" (01/21/2016)  . Hypertension   . Migraine    "qod now" (01/21/2016)  . Preterm labor ~ 2014  . Renal insufficiency   . Seizures ([place])    "last one was in 2000; related to preeclampsia" (01/21/2016)    Patient Active Problem List   Diagnosis Date Noted  . Left arm pain 01/21/2016  . Chronic pain 01/21/2016  . Muscle cramps 03/29/2015  . Symptomatic anemia 09/15/2013  . Anemia of chronic kidney failure 09/15/2013  . Chest pain 09/08/2013  . Hyperkalemia 08/29/2013  . Abdominal pain 08/29/2013  . Nausea & vomiting 08/29/2013  . Cocaine abuse 08/26/2013  . Renal disease in pregnancy, antepartum 08/22/2013  . Rh negative, antepartum 08/22/2013  . Missed abortion 08/22/2013  . Depression 04/21/2012  . Acute blood loss anemia 08/03/2011  . ESRD (end stage renal disease) ([place]) 08/03/2011  . Hypokalemia 08/03/2011    Past Surgical History:  Procedure Laterality Date  . AV FISTULA PLACEMENT Left 2010  . CERVICAL BIOPSY  W/ LOOP ELECTRODE EXCISION  2001  . DILATION AND EVACUATION  08/02/2011   Procedure: DILATATION AND EVACUATION;  Surgeon: Logan Bores, MD;  Location: [facility];  Service: Gynecology;;  . Brigitte Pulse AND EVACUATION N/A 08/31/2013   Procedure: DILATATION AND  EVACUATION;  Surgeon: Woodroe Mode, MD;  Location: [facility];  Service: Gynecology;  Laterality: N/A;  . RENAL BIOPSY    . REVISION OF ARTERIOVENOUS GORETEX GRAFT Left 02/11/2013   Procedure: REVISION OF ARTERIOVENOUS GORTEX FISTULA;  Surgeon: Rosetta Posner, MD;  Location: [facility];  Service: Vascular;  Laterality: Left;    OB History    Gravida Para Term Preterm AB Living   6 1 1  0 4 1   SAB TAB Ectopic Multiple Live Births   2 2 0 0 1       Home Medications    Prior to Admission  medications   Medication Sig Start Date End Date Taking? Authorizing Provider  ceFAZolin (ANCEF) 2-4 GM/100ML-% IVPB Inject 100 mLs (2 g total) into the vein Every Tuesday,Thursday,and Saturday with dialysis. 01/23/16   Geradine Girt, DO  cinacalcet (SENSIPAR) 30 MG tablet Take 30 mg by mouth daily.    Historical Provider, MD  gabapentin (NEURONTIN) 100 MG capsule Take 300 mg by mouth 2 (two) times daily.     Historical Provider, MD  Oxycodone HCl 10 MG TABS Take 1 tablet (10 mg total) by mouth daily. 01/23/16   Geradine Girt, DO  pantoprazole (PROTONIX) 40 MG tablet Take 40 mg by mouth daily.    Historical Provider, MD  sevelamer carbonate (RENVELA) 800 MG tablet Take 2,400 mg by mouth 3 (three) times daily with meals.    Historical Provider, MD  sodium bicarbonate 325 MG tablet Take 325 mg by mouth 2 (two) times daily.    Historical Provider, MD    Family History Family History  Problem Relation Age of Onset  . Diabetes Mother   . Hyperlipidemia Mother   . Hypertension Mother   . Heart disease Mother   . Hypertension    . Diabetes type II      Social History Social History  Substance Use Topics  . Smoking status: Passive Smoke Exposure - Never Smoker    Types: Cigarettes  . Smokeless tobacco: Never Used  . Alcohol use No     Allergies   Prednisone and Tuna [fish allergy]   Review of Systems Review of Systems  Constitutional: Negative for chills and fever.  Respiratory: Positive for cough, chest tightness, shortness of breath and wheezing.   Cardiovascular: Positive for chest pain. Negative for palpitations and leg swelling.  Gastrointestinal: Negative for abdominal pain, diarrhea, nausea and vomiting.  Genitourinary: Negative for dysuria, flank pain and pelvic pain.  Musculoskeletal: Negative for arthralgias, myalgias, neck pain and neck stiffness.  Skin: Negative for rash.  Neurological: Negative for dizziness, weakness and headaches.  All other systems reviewed and  are negative.    Physical Exam Updated Vital Signs BP 155/93 (BP Location: Right Arm)   Pulse 102   Temp 98.9 F (37.2 C) (Oral)   Resp 26   Ht 5\' 5"  (1.651 m)   Wt 87.5 kg   LMP 02/27/2016 (Approximate)   SpO2 100%   BMI 32.10 kg/m   Physical Exam  Constitutional: She is oriented to person, place, and time. She appears well-developed and well-nourished. No distress.  HENT:  Head: Normocephalic.  Eyes: Conjunctivae are normal.  Neck: Neck supple.  Cardiovascular: Normal rate, regular rhythm and normal heart sounds.   Pulmonary/Chest: Effort normal. She has wheezes. She has rales.  Tachypneic, coughing  Abdominal: Soft. Bowel sounds are normal. She exhibits no distension. There is no tenderness. There is no rebound.  Musculoskeletal: She exhibits no edema.  Neurological: She is alert and oriented to person, place, and time.  Skin: Skin is warm and dry.  Psychiatric: She has a normal mood and affect. Her behavior is normal.  Nursing note and vitals reviewed.    ED Treatments / Results  Labs (all labs ordered are listed, but only abnormal results are displayed) Labs Reviewed  CBC WITH DIFFERENTIAL/PLATELET - Abnormal; Notable for the following:       Result Value   RBC 2.82 (*)    Hemoglobin 8.2 (*)    HCT 25.6 (*)    RDW 17.5 (*)    All other components within normal limits  BASIC METABOLIC PANEL - Abnormal; Notable for the following:    Potassium 5.9 (*)    Chloride 97 (*)    CO2 20 (*)    BUN 72 (*)    Creatinine, Ser 18.00 (*)    GFR calc non Af Amer 2 (*)    GFR calc Af Amer 2 (*)    Anion gap 21 (*)    All other components within normal limits  I-STAT TROPOININ, ED    EKG  EKG Interpretation  Date/Time:  Saturday March 07 2016 08:01:29 EST Ventricular Rate:  102 PR Interval:    QRS Duration: 73 QT Interval:  362 QTC Calculation: 472 R Axis:   49 Text Interpretation:  Sinus tachycardia Probable left atrial enlargement Otherwise within normal  limits When compared with ECG of 03/29/2015, QT has shortened Confirmed by Roxanne Mins  MD, DAVID ([id number]) on 03/07/2016 8:04:27 AM       Radiology Dg Chest 2 View  Result Date: 03/07/2016 CLINICAL DATA:  Shortness of breath with wheezing. EXAM: CHEST  2 VIEW COMPARISON:  03/29/2015 FINDINGS: Diffuse interstitial and airspace densities are most compatible with pulmonary edema. Heart size is mildly enlarged. The trachea is midline. Blunting at the costophrenic angles are suggestive for small pleural effusions. Negative for a pneumothorax. No acute bone abnormality. IMPRESSION: Bilateral parenchymal lung densities are most compatible with pulmonary edema and suspect small pleural effusions. Electronically Signed   By: Markus Daft M.D.   On: 03/07/2016 09:36    Procedures Procedures (including critical care time)  Medications Ordered in ED Medications  ipratropium-albuterol (DUONEB) 0.5-2.5 (3) MG/3ML nebulizer solution 3 mL (not administered)     Initial Impression / Assessment and Plan / ED Course  I have reviewed the triage vital signs and the nursing notes.  Pertinent labs & imaging results that were available during my care of the patient were reviewed by me and considered in my medical decision making (see chart for details).  Clinical Course   Patient in emergency department with wheezing, rales, shortness of breath, cough, URI symptoms. Vital signs are normal other than slight tachycardia, patient just received a breathing treatment. She is afebrile. She is not hypoxic. She has wheezing and rails on the exam. Last full dialysis 1 week ago, partial dialysis 3 days ago. Certainly concerning for vascular congestion versus asthma exacerbation. Will continue treatment with nebulized albuterol and Atrovent, patient states she is allergic to steroids, will hold off for now, will get chest x-ray and labs.  Potassium 5.9. Chest x-ray showing pulmonary edema. I think this patient will need dialysis on  emergent basis based on these findings and active shortness of breath. Spoke with Dr. Justin Mend who agrees to take patient for dialysis. After dialysis patient will need reassessment to see if improved.  Vitals:   03/07/16 1050 03/07/16 1100 03/07/16 1130 03/07/16 1200  BP: 156/88 150/98 149/96 (!) 141/93  Pulse: 107 111 92 (!) 110  Resp: (!) 33 25 21 19   Temp:      TempSrc:      SpO2:      Weight:      Height:         Final Clinical Impressions(s) / ED Diagnoses   Final diagnoses:  Acute pulmonary edema (HCC)  Hyperkalemia  Anemia, unspecified type    New Prescriptions New Prescriptions   No medications on file     Jeannett Senior, PA-C 03/07/16 1246    Dorie Rank, MD 03/08/16 [phone]

## 2016-03-07 NOTE — ED Notes (Signed)
Pt given sprite to drink per Chrislyn, RN

## 2016-03-07 NOTE — ED Triage Notes (Signed)
Pt arrives from home via [facility] reporting SOB with wheezing beginning Tuesday. Pt reports not finishing dialysis on Wednesday d/t cramping, last full dialysis Monday.  EMS reports giving 5 mg albuterol, 0.5 mg Atrovent.  Wheezes and crackles noted.

## 2016-03-07 NOTE — ED Notes (Signed)
ED Provider at bedside. 

## 2016-03-08 ENCOUNTER — Emergency Department (HOSPITAL_COMMUNITY)
Admission: EM | Admit: 2016-03-08 | Discharge: 2016-03-08 | Disposition: A | Discharge status: Home / Self Care | Payer: Medicare Other | Attending: Emergency Medicine | Admitting: Emergency Medicine

## 2016-03-08 ENCOUNTER — Encounter (HOSPITAL_COMMUNITY): Payer: Self-pay | Admitting: *Deleted

## 2016-03-08 ENCOUNTER — Emergency Department (EMERGENCY_DEPARTMENT_HOSPITAL)
Admit: 2016-03-08 | Discharge: 2016-03-08 | Disposition: A | Discharge status: Home / Self Care | Payer: Medicare Other | Attending: Emergency Medicine | Admitting: Emergency Medicine

## 2016-03-08 ENCOUNTER — Emergency Department (HOSPITAL_COMMUNITY): Payer: Medicare Other

## 2016-03-08 DIAGNOSIS — R509 Fever, unspecified: Secondary | ICD-10-CM | POA: Diagnosis not present

## 2016-03-08 DIAGNOSIS — M7989 Other specified soft tissue disorders: Secondary | ICD-10-CM | POA: Diagnosis not present

## 2016-03-08 DIAGNOSIS — R062 Wheezing: Secondary | ICD-10-CM | POA: Insufficient documentation

## 2016-03-08 DIAGNOSIS — F17210 Nicotine dependence, cigarettes, uncomplicated: Secondary | ICD-10-CM | POA: Diagnosis not present

## 2016-03-08 DIAGNOSIS — I120 Hypertensive chronic kidney disease with stage 5 chronic kidney disease or end stage renal disease: Secondary | ICD-10-CM | POA: Insufficient documentation

## 2016-03-08 DIAGNOSIS — Z992 Dependence on renal dialysis: Secondary | ICD-10-CM | POA: Insufficient documentation

## 2016-03-08 DIAGNOSIS — R6889 Other general symptoms and signs: Secondary | ICD-10-CM

## 2016-03-08 DIAGNOSIS — N186 End stage renal disease: Secondary | ICD-10-CM | POA: Diagnosis not present

## 2016-03-08 DIAGNOSIS — R52 Pain, unspecified: Secondary | ICD-10-CM | POA: Diagnosis present

## 2016-03-08 LAB — I-STAT CHEM 8, ED
BUN: 29 mg/dL — ABNORMAL HIGH (ref 6–20)
CHLORIDE: 95 mmol/L — AB (ref 101–111)
CREATININE: 10.5 mg/dL — AB (ref 0.44–1.00)
Calcium, Ion: 1.11 mmol/L — ABNORMAL LOW (ref 1.15–1.40)
GLUCOSE: 88 mg/dL (ref 65–99)
HEMATOCRIT: 25 % — AB (ref 36.0–46.0)
HEMOGLOBIN: 8.5 g/dL — AB (ref 12.0–15.0)
POTASSIUM: 4.7 mmol/L (ref 3.5–5.1)
Sodium: 136 mmol/L (ref 135–145)
TCO2: 32 mmol/L (ref 0–100)

## 2016-03-08 LAB — CBC WITH DIFFERENTIAL/PLATELET
BASOS ABS: 0 10*3/uL (ref 0.0–0.1)
BASOS PCT: 0 %
EOS ABS: 0.4 10*3/uL (ref 0.0–0.7)
EOS PCT: 6 %
HCT: 25.1 % — ABNORMAL LOW (ref 36.0–46.0)
HEMOGLOBIN: 8 g/dL — AB (ref 12.0–15.0)
LYMPHS ABS: 1.9 10*3/uL (ref 0.7–4.0)
Lymphocytes Relative: 33 %
MCH: 29.3 pg (ref 26.0–34.0)
MCHC: 31.9 g/dL (ref 30.0–36.0)
MCV: 91.9 fL (ref 78.0–100.0)
Monocytes Absolute: 0.5 10*3/uL (ref 0.1–1.0)
Monocytes Relative: 9 %
NEUTROS PCT: 52 %
Neutro Abs: 3.1 10*3/uL (ref 1.7–7.7)
PLATELETS: 278 10*3/uL (ref 150–400)
RBC: 2.73 MIL/uL — AB (ref 3.87–5.11)
RDW: 17.5 % — ABNORMAL HIGH (ref 11.5–15.5)
WBC: 5.9 10*3/uL (ref 4.0–10.5)

## 2016-03-08 LAB — COMPREHENSIVE METABOLIC PANEL
ALBUMIN: 3.4 g/dL — AB (ref 3.5–5.0)
ALK PHOS: 52 U/L (ref 38–126)
AST: 27 U/L (ref 15–41)
Anion gap: 11 (ref 5–15)
BUN: 29 mg/dL — ABNORMAL HIGH (ref 6–20)
CALCIUM: 9.6 mg/dL (ref 8.9–10.3)
CHLORIDE: 96 mmol/L — AB (ref 101–111)
CO2: 28 mmol/L (ref 22–32)
CREATININE: 10.26 mg/dL — AB (ref 0.44–1.00)
GFR calc Af Amer: 5 mL/min — ABNORMAL LOW (ref >60)
GFR calc non Af Amer: 4 mL/min — ABNORMAL LOW (ref >60)
GLUCOSE: 90 mg/dL (ref 65–99)
Potassium: 4.7 mmol/L (ref 3.5–5.1)
SODIUM: 135 mmol/L (ref 135–145)
Total Bilirubin: 0.5 mg/dL (ref 0.3–1.2)
Total Protein: 7.8 g/dL (ref 6.5–8.1)

## 2016-03-08 LAB — I-STAT BETA HCG BLOOD, ED (MC, WL, AP ONLY)

## 2016-03-08 LAB — LIPASE, BLOOD: Lipase: 46 U/L (ref 11–51)

## 2016-03-08 MED ORDER — BENZONATATE 100 MG PO CAPS: 200.0000 mg | ORAL_CAPSULE | Freq: Two times a day (BID) | ORAL | 0 refills | Status: DC | PRN

## 2016-03-08 MED ORDER — IPRATROPIUM-ALBUTEROL 0.5-2.5 (3) MG/3ML IN SOLN
3.0000 mL | Freq: Once | RESPIRATORY_TRACT | Status: AC
  Administered 2016-03-08: 3 mL via RESPIRATORY_TRACT
  Filled 2016-03-08: qty 3

## 2016-03-08 NOTE — ED Provider Notes (Signed)
Des[facility] [name] DEPT Provider Note   CSN: [id number] Arrival date & time: 03/08/16  0316     History   Chief Complaint Chief Complaint  Patient presents with  . Generalized Body Aches    HPI Desiree [name] is a 40 y.o. female.With a past medical history of Asthma, end-stage renal disease on hemodialysis, anemia, hypertension, who comes in today with complaint of fevers, chills, body aches, ongoing for the past 6 days. She was seen yesterday in the emergency department after missing several dialysis appointments. She had hyperkalemia, pulmonary edema and was in need of emergent dialysis that day. Patient states her breathing is improved. However, she continues to have wheezing and shortness of breath. She states that her temperature at home was 100 today. She also complains of pain in the right arm with intermittent swelling that improves after resting at night. She states that throughout the day. Her swelling becomes far greater. Her fistula is in her left forearm. She does have a history of previous subclavian catheter placements greater than 5 years ago in the right upper chest wall with some superficial scarring over that region. She has significant swelling in the right hand as compared to left, with heaviness. No readily visible venous dilatation on that side. She states that it feels uncomfortable and is at times painful. She's had persistent chest pain with cough but denies any, hemoptysis.Marland Kitchen  HPI  Past Medical History:  Diagnosis Date  . Childhood asthma   . Complication of anesthesia    "sometimes it does not work; didn't during [place]" (01/21/2016)  . ESRD (end stage renal disease) on dialysis ([place])    "TTS; [facility]; [facility]" (01/21/2016)  . GERD (gastroesophageal reflux disease)    nexium prn  . Gout   . History of blood transfusion    "related to kidneys; I've had 4" (01/21/2016)  . Hypertension   . Migraine    "qod now" (01/21/2016)  . Preterm labor ~ 2014  .  Renal insufficiency   . Seizures ([place])    "last one was in 2000; related to preeclampsia" (01/21/2016)    Patient Active Problem List   Diagnosis Date Noted  . Dyspnea 03/07/2016  . Left arm pain 01/21/2016  . Chronic pain 01/21/2016  . Muscle cramps 03/29/2015  . Symptomatic anemia 09/15/2013  . Anemia of chronic kidney failure 09/15/2013  . Chest pain 09/08/2013  . Hyperkalemia 08/29/2013  . Abdominal pain 08/29/2013  . Nausea & vomiting 08/29/2013  . Cocaine abuse 08/26/2013  . Renal disease in pregnancy, antepartum 08/22/2013  . Rh negative, antepartum 08/22/2013  . Missed abortion 08/22/2013  . Depression 04/21/2012  . Acute blood loss anemia 08/03/2011  . ESRD (end stage renal disease) ([place]) 08/03/2011  . Hypokalemia 08/03/2011    Past Surgical History:  Procedure Laterality Date  . AV FISTULA PLACEMENT Left 2010  . CERVICAL BIOPSY  W/ LOOP ELECTRODE EXCISION  2001  . DILATION AND EVACUATION  08/02/2011   Procedure: DILATATION AND EVACUATION;  Surgeon: Logan Bores, MD;  Location: [facility];  Service: Gynecology;;  . Brigitte Pulse AND EVACUATION N/A 08/31/2013   Procedure: DILATATION AND EVACUATION;  Surgeon: Woodroe Mode, MD;  Location: [facility];  Service: Gynecology;  Laterality: N/A;  . RENAL BIOPSY    . REVISION OF ARTERIOVENOUS GORETEX GRAFT Left 02/11/2013   Procedure: REVISION OF ARTERIOVENOUS GORTEX FISTULA;  Surgeon: Rosetta Posner, MD;  Location: [facility];  Service: Vascular;  Laterality: Left;    OB History  Gravida Para Term Preterm AB Living   6 1 1  0 4 1   SAB TAB Ectopic Multiple Live Births   2 2 0 0 1       Home Medications    Prior to Admission medications   Medication Sig Start Date End Date Taking? Authorizing Provider  ceFAZolin (ANCEF) 2-4 GM/100ML-% IVPB Inject 100 mLs (2 g total) into the vein Every Tuesday,Thursday,and Saturday with dialysis. 01/23/16   Geradine Girt, DO  cinacalcet (SENSIPAR) 30 MG tablet Take 30 mg by mouth daily.     Historical Provider, MD  gabapentin (NEURONTIN) 100 MG capsule Take 300 mg by mouth 2 (two) times daily.     Historical Provider, MD  Oxycodone HCl 10 MG TABS Take 1 tablet (10 mg total) by mouth daily. 01/23/16   Geradine Girt, DO  pantoprazole (PROTONIX) 40 MG tablet Take 40 mg by mouth daily.    Historical Provider, MD  sevelamer carbonate (RENVELA) 800 MG tablet Take 2,400 mg by mouth 3 (three) times daily with meals.    Historical Provider, MD  sodium bicarbonate 325 MG tablet Take 325 mg by mouth 2 (two) times daily.    Historical Provider, MD    Family History Family History  Problem Relation Age of Onset  . Diabetes Mother   . Hyperlipidemia Mother   . Hypertension Mother   . Heart disease Mother   . Hypertension    . Diabetes type II      Social History Social History  Substance Use Topics  . Smoking status: Passive Smoke Exposure - Never Smoker    Types: Cigarettes  . Smokeless tobacco: Never Used  . Alcohol use No     Allergies   Prednisone and Tuna [fish allergy]   Review of Systems Review of Systems   Physical Exam Updated Vital Signs BP 135/97   Pulse 96   Temp 98.9 F (37.2 C) (Oral)   Resp 12   LMP 02/27/2016 (Approximate)   SpO2 100%   Physical Exam  Constitutional: She is oriented to person, place, and time. She appears well-developed and well-nourished. No distress.  HENT:  Head: Normocephalic and atraumatic.  Eyes: Conjunctivae are normal. No scleral icterus.  Neck: Normal range of motion.  Cardiovascular: Normal rate, regular rhythm and normal heart sounds.  Exam reveals no gallop and no friction rub.   No murmur heard. AV graft in the left forearm with palpable thrill  Pulmonary/Chest: Effort normal. No respiratory distress. She has wheezes.  Diffuse expiratory wheezes  Abdominal: Soft. Bowel sounds are normal. She exhibits no distension and no mass. There is no tenderness. There is no guarding.  Musculoskeletal: Normal range of motion.   Right arm with swelling of the right hand greater than the left hand, right forearm swelling greater than left. No tenderness to palpation. Equal grip strengths. Sensation equal. No overt venous dilatation of the right arm  Neurological: She is alert and oriented to person, place, and time.  Skin: Skin is warm and dry. She is not diaphoretic.  Nursing note and vitals reviewed.    ED Treatments / Results  Labs (all labs ordered are listed, but only abnormal results are displayed) Labs Reviewed  CBC WITH DIFFERENTIAL/PLATELET  COMPREHENSIVE METABOLIC PANEL  LIPASE, BLOOD  INFLUENZA PANEL BY PCR (TYPE A & B, H1N1)  I-STAT BETA HCG BLOOD, ED ([facility], [facility], [facility] ONLY)  I-STAT CHEM 8, ED    EKG  EKG Interpretation None  Radiology Dg Chest 2 View  Result Date: 03/07/2016 CLINICAL DATA:  Shortness of breath with wheezing. EXAM: CHEST  2 VIEW COMPARISON:  03/29/2015 FINDINGS: Diffuse interstitial and airspace densities are most compatible with pulmonary edema. Heart size is mildly enlarged. The trachea is midline. Blunting at the costophrenic angles are suggestive for small pleural effusions. Negative for a pneumothorax. No acute bone abnormality. IMPRESSION: Bilateral parenchymal lung densities are most compatible with pulmonary edema and suspect small pleural effusions. Electronically Signed   By: Markus Daft M.D.   On: 03/07/2016 09:36    Procedures Procedures (including critical care time)  Medications Ordered in ED Medications - No data to display   Initial Impression / Assessment and Plan / ED Course  I have reviewed the triage vital signs and the nursing notes.  Pertinent labs & imaging results that were available during my care of the patient were reviewed by me and considered in my medical decision making (see chart for details).  Clinical Course as of Mar 08 1724  Sun Mar 08, 2016  2353 Patient refuses . Influenza swab  [AH]  1055 Patient work up negative. Wheezing  improved after DuoNeb treatment. Her labs at baseline. Unable to give her prednisone for wheezing secondary to intolerance. She'll be discharged with Tessalon. She is hypotensive and advised to follow up with her PCP regarding her hypertension. She is well-appearing with normal oxygen saturations. She is safe for discharge at this time.  [AH]    Clinical Course User Index [AH] Margarita Mail, PA-C      Final Clinical Impressions(s) / ED Diagnoses   Final diagnoses:  None    New Prescriptions New Prescriptions   No medications on file     Margarita Mail, PA-C 03/08/16 [place], MD 03/08/16 2116

## 2016-03-08 NOTE — Progress Notes (Signed)
VASCULAR LAB PRELIMINARY  PRELIMINARY  PRELIMINARY  PRELIMINARY  Right upper extremity venous duplex completed.    Preliminary report:  There is no DVT or SVT noted in the right upper extremity.   Called results to Margarita Mail, PA-C  [facility], [place], RVT 03/08/2016, 9:26 AM

## 2016-03-08 NOTE — ED Triage Notes (Signed)
The pt arrived by [facility] from home pt is a dialysis pt that was here yesterday and was dialyzed yesterday she is c/o generalized body aches for one week headache  Vomiting and zofran not helping.  She is unable to sleep

## 2016-03-08 NOTE — Discharge Instructions (Signed)
Your work up was negative today. I am discharging you with a cough suppressant. Please continue to use your inhaler. You may take over the counter cold medications- Please consult with the pharmacist regarding renal dosing if necessary. You may take tylenol for your body aches.  All of your imaging was negative.  Please follow up with your doctor as soon as possible. Return for worsening sob.

## 2016-03-08 NOTE — ED Notes (Signed)
Pt talking to visitor and texting on phone without difficulty.

## 2016-03-08 NOTE — ED Notes (Signed)
Pt to be taken for UE venous duplex from X-ray

## 2016-03-08 NOTE — ED Notes (Signed)
This RN attempted to collect a flu swab on the pt. When this RN inserted the tip of the swab in the pts nose the pt ripped the swab out of her nose. The pt then said she refused to have the swab done. This RN explained to the pt that this is the only way that the flu can be tested. The pt again refused to allow this RN to complete the flu swab.  Abigale PA made aware.

## 2016-03-08 NOTE — ED Notes (Signed)
Pt taken back to room via wheelchair, pt ambulated from chair to bed without difficulty. Pt to change into gown, visitor at bedside.

## 2016-03-08 NOTE — ED Notes (Signed)
Patient transported to X-ray 

## 2016-03-11 ENCOUNTER — Ambulatory Visit (INDEPENDENT_AMBULATORY_CARE_PROVIDER_SITE_OTHER): Payer: Medicare Other | Admitting: Vascular Surgery

## 2016-03-11 ENCOUNTER — Ambulatory Visit: Payer: Medicare Other

## 2016-03-11 VITALS — BP 147/94 | HR 94 | Temp 98.7°F | Resp 16 | Ht 65.0 in | Wt 196.0 lb

## 2016-03-11 DIAGNOSIS — Z992 Dependence on renal dialysis: Secondary | ICD-10-CM

## 2016-03-11 DIAGNOSIS — N186 End stage renal disease: Secondary | ICD-10-CM

## 2016-03-11 NOTE — Progress Notes (Signed)
   Patient name: Desiree [name] MRN: [id number] DOB: [birth year] Sex: female  REASON FOR VISIT: follow-up   HPI: Desiree [name] is a 40 y.o. female who was seen as a hospital consult on 01/21/2016 for pain in her left radial cephalic fistula. At that time, her fistula was felt to not be infected. However, her blood cultures revealed gram-positive cocci and she was placed on vancomycin. 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   The patient states that her pain is significantly better. She denies any bleeding issues with dialysis. Does note some scabs to her left arm fistula. She states that her fistula is being stuck at the same sites every time.  Current Outpatient Prescriptions  Medication Sig Dispense Refill  . benzonatate (TESSALON) 100 MG capsule Take 2 capsules (200 mg total) by mouth 2 (two) times daily as needed for cough. 20 capsule 0  . ceFAZolin (ANCEF) 2-4 GM/100ML-% IVPB Inject 100 mLs (2 g total) into the vein Every Tuesday,Thursday,and Saturday with dialysis. 1 each   . cinacalcet (SENSIPAR) 30 MG tablet Take 30 mg by mouth daily.    Marland Kitchen gabapentin (NEURONTIN) 100 MG capsule Take 300 mg by mouth 2 (two) times daily.     . Oxycodone HCl 10 MG TABS Take 1 tablet (10 mg total) by mouth daily. 3 tablet 0  . pantoprazole (PROTONIX) 40 MG tablet Take 40 mg by mouth daily.    . sevelamer carbonate (RENVELA) 800 MG tablet Take 2,400 mg by mouth 3 (three) times daily with meals.    . sodium bicarbonate 325 MG tablet Take 325 mg by mouth 2 (two) times daily.     No current facility-administered medications for this visit.     REVIEW OF SYSTEMS:  [X]  denotes positive finding, [ ]  denotes negative finding Cardiac  Comments:  Chest pain or chest pressure:    Shortness of breath upon exertion:    Short of breath when lying flat:    Irregular heart rhythm:    Constitutional    Fever or chills:       PHYSICAL EXAM: Vitals:   03/11/16 1342 03/11/16 1343  BP: (!) 157/94 (!) 147/94  Pulse: 94   Resp: 16   Temp: 98.7 F (37.1 C)   TempSrc: Oral   SpO2: 99%   Weight: 196 lb (88.9 kg)   Height: 5\' 5"  (1.651 m)     GENERAL: The patient is a well-nourished female, in no acute distress. The vital signs are documented above. PULMONARY: Non-labored respiratory effort. VASCULAR: Some swelling to left distal fistula. No significant aneurysms seen. No skin thinning or ulceration. 2 areas of darkened skin from repeated cannulation. No erythema or fluctuance. Easily palpable thrill.   MEDICAL ISSUES: Painful left radiocephalic AV fistula  The patient's pain has improved significantly. The area does not look infected. Have encouraged the patient to have her fistula stuck in different areas each time. Also advised her to not peel any scabs that may form on her skin given risk of bleeding. She denies any bleeding issues with dialysis. She will follow up on an as-needed basis.  Virgina Jock, PA-C [facility]

## 2016-05-02 ENCOUNTER — Emergency Department (HOSPITAL_COMMUNITY): Payer: Medicare Other

## 2016-05-02 ENCOUNTER — Encounter (HOSPITAL_COMMUNITY): Payer: Self-pay

## 2016-05-02 ENCOUNTER — Emergency Department (HOSPITAL_COMMUNITY)
Admission: EM | Admit: 2016-05-02 | Discharge: 2016-05-02 | Disposition: A | Discharge status: Home / Self Care | Payer: Medicare Other | Attending: Emergency Medicine | Admitting: Emergency Medicine

## 2016-05-02 DIAGNOSIS — Z7722 Contact with and (suspected) exposure to environmental tobacco smoke (acute) (chronic): Secondary | ICD-10-CM | POA: Diagnosis not present

## 2016-05-02 DIAGNOSIS — N186 End stage renal disease: Secondary | ICD-10-CM | POA: Diagnosis not present

## 2016-05-02 DIAGNOSIS — I120 Hypertensive chronic kidney disease with stage 5 chronic kidney disease or end stage renal disease: Secondary | ICD-10-CM | POA: Insufficient documentation

## 2016-05-02 DIAGNOSIS — Z992 Dependence on renal dialysis: Secondary | ICD-10-CM | POA: Diagnosis not present

## 2016-05-02 DIAGNOSIS — B9789 Other viral agents as the cause of diseases classified elsewhere: Secondary | ICD-10-CM

## 2016-05-02 DIAGNOSIS — Z79899 Other long term (current) drug therapy: Secondary | ICD-10-CM | POA: Diagnosis not present

## 2016-05-02 DIAGNOSIS — J40 Bronchitis, not specified as acute or chronic: Secondary | ICD-10-CM | POA: Diagnosis not present

## 2016-05-02 DIAGNOSIS — J069 Acute upper respiratory infection, unspecified: Secondary | ICD-10-CM | POA: Diagnosis not present

## 2016-05-02 DIAGNOSIS — R05 Cough: Secondary | ICD-10-CM | POA: Diagnosis present

## 2016-05-02 LAB — CBC WITH DIFFERENTIAL/PLATELET
Basophils Absolute: 0 10*3/uL (ref 0.0–0.1)
Basophils Relative: 0 %
EOS ABS: 0.4 10*3/uL (ref 0.0–0.7)
EOS PCT: 6 %
HCT: 32.6 % — ABNORMAL LOW (ref 36.0–46.0)
Hemoglobin: 10.2 g/dL — ABNORMAL LOW (ref 12.0–15.0)
LYMPHS ABS: 1.8 10*3/uL (ref 0.7–4.0)
LYMPHS PCT: 27 %
MCH: 30.3 pg (ref 26.0–34.0)
MCHC: 31.3 g/dL (ref 30.0–36.0)
MCV: 96.7 fL (ref 78.0–100.0)
Monocytes Absolute: 0.4 10*3/uL (ref 0.1–1.0)
Monocytes Relative: 6 %
Neutro Abs: 4 10*3/uL (ref 1.7–7.7)
Neutrophils Relative %: 61 %
PLATELETS: 253 10*3/uL (ref 150–400)
RBC: 3.37 MIL/uL — ABNORMAL LOW (ref 3.87–5.11)
RDW: 17.4 % — ABNORMAL HIGH (ref 11.5–15.5)
WBC: 6.7 10*3/uL (ref 4.0–10.5)

## 2016-05-02 LAB — COMPREHENSIVE METABOLIC PANEL
ALT: 12 U/L — AB (ref 14–54)
AST: 17 U/L (ref 15–41)
Albumin: 3.3 g/dL — ABNORMAL LOW (ref 3.5–5.0)
Alkaline Phosphatase: 44 U/L (ref 38–126)
Anion gap: 15 (ref 5–15)
BILIRUBIN TOTAL: 1.5 mg/dL — AB (ref 0.3–1.2)
BUN: 47 mg/dL — AB (ref 6–20)
CHLORIDE: 100 mmol/L — AB (ref 101–111)
CO2: 25 mmol/L (ref 22–32)
Calcium: 10.3 mg/dL (ref 8.9–10.3)
Creatinine, Ser: 13.96 mg/dL — ABNORMAL HIGH (ref 0.44–1.00)
GFR calc Af Amer: 3 mL/min — ABNORMAL LOW (ref >60)
GFR, EST NON AFRICAN AMERICAN: 3 mL/min — AB (ref >60)
Glucose, Bld: 101 mg/dL — ABNORMAL HIGH (ref 65–99)
POTASSIUM: 4.4 mmol/L (ref 3.5–5.1)
Sodium: 140 mmol/L (ref 135–145)
TOTAL PROTEIN: 6.9 g/dL (ref 6.5–8.1)

## 2016-05-02 LAB — I-STAT CG4 LACTIC ACID, ED
LACTIC ACID, VENOUS: 2.27 mmol/L — AB (ref 0.5–1.9)
LACTIC ACID, VENOUS: 1.72 mmol/L (ref 0.5–1.9)

## 2016-05-02 MED ORDER — IPRATROPIUM-ALBUTEROL 0.5-2.5 (3) MG/3ML IN SOLN
3.0000 mL | Freq: Once | RESPIRATORY_TRACT | Status: AC
  Administered 2016-05-02: 3 mL via RESPIRATORY_TRACT
  Filled 2016-05-02: qty 3

## 2016-05-02 MED ORDER — DEXAMETHASONE SODIUM PHOSPHATE 10 MG/ML IJ SOLN
10.0000 mg | Freq: Once | INTRAMUSCULAR | Status: AC
  Administered 2016-05-02: 10 mg via INTRAVENOUS
  Filled 2016-05-02: qty 1

## 2016-05-02 MED ORDER — OXYCODONE-ACETAMINOPHEN 5-325 MG PO TABS
1.0000 | ORAL_TABLET | Freq: Once | ORAL | Status: AC
  Administered 2016-05-02: 1 via ORAL
  Filled 2016-05-02: qty 1

## 2016-05-02 MED ORDER — BENZONATATE 100 MG PO CAPS: 100.0000 mg | ORAL_CAPSULE | Freq: Three times a day (TID) | ORAL | 0 refills | Status: DC

## 2016-05-02 MED ORDER — ALBUTEROL SULFATE HFA 108 (90 BASE) MCG/ACT IN AERS
2.0000 | INHALATION_SPRAY | Freq: Once | RESPIRATORY_TRACT | Status: AC
  Administered 2016-05-02: 2 via RESPIRATORY_TRACT
  Filled 2016-05-02: qty 6.7

## 2016-05-02 MED ORDER — SODIUM CHLORIDE 0.9 % IV BOLUS (SEPSIS)
250.0000 mL | Freq: Once | INTRAVENOUS | Status: AC
  Administered 2016-05-02: 250 mL via INTRAVENOUS

## 2016-05-02 MED ORDER — ALBUTEROL SULFATE HFA 108 (90 BASE) MCG/ACT IN AERS: 2.0000 | INHALATION_SPRAY | RESPIRATORY_TRACT | 2 refills | Status: DC | PRN

## 2016-05-02 NOTE — ED Notes (Signed)
Pt transported to xray 

## 2016-05-02 NOTE — ED Notes (Signed)
EDP at bedside  

## 2016-05-02 NOTE — ED Notes (Signed)
Chris RN notified of pt's Lactic

## 2016-05-02 NOTE — Discharge Instructions (Signed)
Your chest x-ray does not show pneumonia. Continue to use your inhaler every 4 hours as needed for cough and shortness of breath. Go to your scheduled dialysis appointment on Monday as scheduled.  Return for worsening symptoms, including fever, confusion, difficulty breathing, or any other symptoms concerning to you.

## 2016-05-02 NOTE — ED Provider Notes (Signed)
[facility] DEPT Provider Note   CSN: [id number] Arrival date & time: 05/02/16  1551     History   Chief Complaint Chief Complaint  Patient presents with  . Pneumonia/ from urgent care    HPI Desiree [name] is a 40 y.o. female.  HPI 41 year old female who presents with cough. [facility] from [facility] with concern for pneumonia today. She has a history of end-stage renal disease on hemodialysis Tuesdays, Thursdays, and Saturdays. Also history of hypertension, and childhood asthma. States onset of cough productive of thick yellow sputum starting 2 weeks ago with mild shortness of breath, congestion, and soreness. Has not had fevers or chills. Does occasionally have emesis with coughing but no diarrhea. States multiple sick contacts at home and in dialysis. Was seen at [facility] care today, with chest x-ray suggestive of bilateral pneumonia and she was sent to ED for evaluation. States that she did miss her dialysis appointment today. No leg swelling or leg pain.   Past Medical History:  Diagnosis Date  . Childhood asthma   . Complication of anesthesia    "sometimes it does not work; didn't during [place]" (01/21/2016)  . ESRD (end stage renal disease) on dialysis ([place])    "TTS; [facility]; [facility]" (01/21/2016)  . GERD (gastroesophageal reflux disease)    nexium prn  . Gout   . History of blood transfusion    "related to kidneys; I've had 4" (01/21/2016)  . Hypertension   . Migraine    "qod now" (01/21/2016)  . Preterm labor ~ 2014  . Renal insufficiency   . Seizures ([place])    "last one was in 2000; related to preeclampsia" (01/21/2016)    Patient Active Problem List   Diagnosis Date Noted  . Dyspnea 03/07/2016  . Left arm pain 01/21/2016  . Chronic pain 01/21/2016  . Muscle cramps 03/29/2015  . Symptomatic anemia 09/15/2013  . Anemia of chronic kidney failure 09/15/2013  . Chest pain 09/08/2013  . Hyperkalemia 08/29/2013  . Abdominal pain  08/29/2013  . Nausea & vomiting 08/29/2013  . Cocaine abuse 08/26/2013  . Renal disease in pregnancy, antepartum 08/22/2013  . Rh negative, antepartum 08/22/2013  . Missed abortion 08/22/2013  . Depression 04/21/2012  . Acute blood loss anemia 08/03/2011  . ESRD (end stage renal disease) ([place]) 08/03/2011  . Hypokalemia 08/03/2011    Past Surgical History:  Procedure Laterality Date  . AV FISTULA PLACEMENT Left 2010  . CERVICAL BIOPSY  W/ LOOP ELECTRODE EXCISION  2001  . DILATION AND EVACUATION  08/02/2011   Procedure: DILATATION AND EVACUATION;  Surgeon: Logan Bores, MD;  Location: [facility];  Service: Gynecology;;  . Brigitte Pulse AND EVACUATION N/A 08/31/2013   Procedure: DILATATION AND EVACUATION;  Surgeon: Woodroe Mode, MD;  Location: [facility];  Service: Gynecology;  Laterality: N/A;  . RENAL BIOPSY    . REVISION OF ARTERIOVENOUS GORETEX GRAFT Left 02/11/2013   Procedure: REVISION OF ARTERIOVENOUS GORTEX FISTULA;  Surgeon: Rosetta Posner, MD;  [facility];  Service: Vascular;  Laterality: Left;    OB History    Gravida Para Term Preterm AB Living   6 1 1  0 4 1   SAB TAB Ectopic Multiple Live Births   2 2 0 0 1       Home Medications    Prior to Admission medications   Medication Sig Start Date End Date Taking? Authorizing Provider  benzonatate (TESSALON) 100 MG capsule Take 2 capsules (  200 mg total) by mouth 2 (two) times daily as needed for cough. 03/08/16   Margarita Mail, PA-C  ceFAZolin (ANCEF) 2-4 GM/100ML-% IVPB Inject 100 mLs (2 g total) into the vein Every Tuesday,Thursday,and Saturday with dialysis. 01/23/16   Geradine Girt, DO  cinacalcet (SENSIPAR) 30 MG tablet Take 30 mg by mouth daily.    Historical Provider, MD  gabapentin (NEURONTIN) 100 MG capsule Take 300 mg by mouth 2 (two) times daily.     Historical Provider, MD  Oxycodone HCl 10 MG TABS Take 1 tablet (10 mg total) by mouth daily. 01/23/16   Geradine Girt, DO  pantoprazole (PROTONIX) 40 MG tablet Take  40 mg by mouth daily.    Historical Provider, MD  sevelamer carbonate (RENVELA) 800 MG tablet Take 2,400 mg by mouth 3 (three) times daily with meals.    Historical Provider, MD  sodium bicarbonate 325 MG tablet Take 325 mg by mouth 2 (two) times daily.    Historical Provider, MD    Family History Family History  Problem Relation Age of Onset  . Diabetes Mother   . Hyperlipidemia Mother   . Hypertension Mother   . Heart disease Mother   . Hypertension    . Diabetes type II      Social History Social History  Substance Use Topics  . Smoking status: Passive Smoke Exposure - Never Smoker    Types: Cigarettes  . Smokeless tobacco: Never Used  . Alcohol use No     Allergies   Prednisone and Geralyn Flash [fish allergy]   Review of Systems Review of Systems 10/14 systems reviewed and are negative other than those stated in the HPI   Physical Exam Updated Vital Signs BP (!) 153/110 (BP Location: Right Arm)   Pulse 100   Temp 98.5 F (36.9 C) (Oral)   Resp 18   SpO2 100%   Physical Exam Physical Exam  Nursing note and vitals reviewed. Constitutional: Non-toxic, and in no acute distress Head: Normocephalic and atraumatic.  Mouth/Throat: Oropharynx is clear and moist.  Neck: Normal range of motion. Neck supple.  Cardiovascular: Normal rate and regular rhythm.   no lower extremity edema. Palpable thrill over her left forearm AV fistula Pulmonary/Chest: Effort normal. Speaks in full sentences. Diffuse expiratory wheezing in all lung fields Abdominal: Soft. There is no tenderness. There is no rebound and no guarding.  Musculoskeletal: Normal range of motion.  Neurological: Alert, no facial droop, fluent speech, moves all extremities symmetrically Skin: Skin is warm and dry.  Psychiatric: Cooperative   ED Treatments / Results  Labs (all labs ordered are listed, but only abnormal results are displayed) Labs Reviewed  CBC WITH DIFFERENTIAL/PLATELET - Abnormal; Notable for the  following:       Result Value   RBC 3.37 (*)    Hemoglobin 10.2 (*)    HCT 32.6 (*)    RDW 17.4 (*)    All other components within normal limits  I-STAT CG4 LACTIC ACID, ED - Abnormal; Notable for the following:    Lactic Acid, Venous 2.27 (*)    All other components within normal limits  COMPREHENSIVE METABOLIC PANEL    EKG  EKG Interpretation None       Radiology No results found.  Procedures Procedures (including critical care time)  Medications Ordered in ED Medications  ipratropium-albuterol (DUONEB) 0.5-2.5 (3) MG/3ML nebulizer solution 3 mL (3 mLs Nebulization Given 05/02/16 1708)     Initial Impression / Assessment and Plan / ED Course  I have reviewed the triage vital signs and the nursing notes.  Pertinent labs & imaging results that were available during my care of the patient were reviewed by me and considered in my medical decision making (see chart for details).     41 year old female with history of end-stage renal disease who presents with cough and congestion from urgent care, with concern for pneumonia. She is nontoxic in no acute distress. Breathing comfortably on room air, speaking in full sentences, without any significant respiratory distress. She has diffuse wheezing on lung exam. Although past medical history states childhood asthma, she states that she continues to have wheezing as an adult and often needs an inhaler.  Chest x-ray is visualized and does not show evidence of pneumonia, significant edema, or any other significant cardiopulmonary processes. Suspect component of asthma with potential viral respiratory illness. She is given breathing treatments and dose of Decadron. Reports subjective improvement in breathing. Ambulates normally in the emergency department with normal oxygenation.  She did miss her dialysis appointment today. However she has a normal potassium, and she denies fluid overload symptoms. She has already rescheduled her own  dialysis appointment for Monday and today does not require any urgent or emergent dialysis. She did have lactic acid in triage that was mildly elevated. However, no signs other sepsis here in the ED or concerns for decreased perfusion.    She is felt stable for discharge home. Strict return and follow-up instructions reviewed. She expressed understanding of all discharge instructions and felt comfortable with the plan of care.     Final Clinical Impressions(s) / ED Diagnoses   Final diagnoses:  None    New Prescriptions New Prescriptions   No medications on file     Forde Dandy, MD 05/02/16 2017

## 2016-05-02 NOTE — ED Triage Notes (Signed)
Lactic acid  2.25 reported to dr Winfred Leeds  Already an acuity 2  Next room available

## 2016-05-02 NOTE — ED Notes (Signed)
Patient back from x-ray 

## 2016-05-02 NOTE — ED Triage Notes (Signed)
[facility] from [facility] for further evaluation of pneumonia. Complains of cough and congestion x 2 weeks. Missed dialysis today due to same. NAD. Speaking complete sentences

## 2016-05-02 NOTE — ED Notes (Signed)
Pt returns from radiology placed back on tele.

## 2016-05-02 NOTE — ED Notes (Signed)
Ambulated pt around the hall and O2 stayed around 97 to 99%. Pt was coughing a lot and said her chest was hurting

## 2016-06-07 ENCOUNTER — Emergency Department (HOSPITAL_COMMUNITY)
Admission: EM | Admit: 2016-06-07 | Discharge: 2016-06-07 | Disposition: A | Discharge status: Home / Self Care | Payer: Medicare Other | Attending: Emergency Medicine | Admitting: Emergency Medicine

## 2016-06-07 ENCOUNTER — Emergency Department (HOSPITAL_COMMUNITY): Payer: Medicare Other

## 2016-06-07 ENCOUNTER — Encounter (HOSPITAL_COMMUNITY): Payer: Self-pay | Admitting: Oncology

## 2016-06-07 DIAGNOSIS — G43009 Migraine without aura, not intractable, without status migrainosus: Secondary | ICD-10-CM | POA: Diagnosis not present

## 2016-06-07 DIAGNOSIS — Z992 Dependence on renal dialysis: Secondary | ICD-10-CM | POA: Insufficient documentation

## 2016-06-07 DIAGNOSIS — G43909 Migraine, unspecified, not intractable, without status migrainosus: Secondary | ICD-10-CM | POA: Diagnosis present

## 2016-06-07 DIAGNOSIS — N186 End stage renal disease: Secondary | ICD-10-CM | POA: Insufficient documentation

## 2016-06-07 DIAGNOSIS — Z79899 Other long term (current) drug therapy: Secondary | ICD-10-CM | POA: Insufficient documentation

## 2016-06-07 DIAGNOSIS — I120 Hypertensive chronic kidney disease with stage 5 chronic kidney disease or end stage renal disease: Secondary | ICD-10-CM | POA: Diagnosis not present

## 2016-06-07 DIAGNOSIS — Z7722 Contact with and (suspected) exposure to environmental tobacco smoke (acute) (chronic): Secondary | ICD-10-CM | POA: Insufficient documentation

## 2016-06-07 LAB — COMPREHENSIVE METABOLIC PANEL
ALT: 7 U/L — AB (ref 14–54)
AST: 13 U/L — ABNORMAL LOW (ref 15–41)
Albumin: 3.7 g/dL (ref 3.5–5.0)
Alkaline Phosphatase: 48 U/L (ref 38–126)
Anion gap: 11 (ref 5–15)
BUN: 43 mg/dL — ABNORMAL HIGH (ref 6–20)
CHLORIDE: 98 mmol/L — AB (ref 101–111)
CO2: 29 mmol/L (ref 22–32)
CREATININE: 12.11 mg/dL — AB (ref 0.44–1.00)
Calcium: 10.3 mg/dL (ref 8.9–10.3)
GFR calc Af Amer: 4 mL/min — ABNORMAL LOW (ref >60)
GFR calc non Af Amer: 3 mL/min — ABNORMAL LOW (ref >60)
Glucose, Bld: 106 mg/dL — ABNORMAL HIGH (ref 65–99)
POTASSIUM: 4.2 mmol/L (ref 3.5–5.1)
SODIUM: 138 mmol/L (ref 135–145)
Total Bilirubin: 0.4 mg/dL (ref 0.3–1.2)
Total Protein: 7.9 g/dL (ref 6.5–8.1)

## 2016-06-07 LAB — CBC WITH DIFFERENTIAL/PLATELET
BASOS ABS: 0 10*3/uL (ref 0.0–0.1)
Basophils Relative: 0 %
EOS ABS: 0.3 10*3/uL (ref 0.0–0.7)
EOS PCT: 5 %
HCT: 36.3 % (ref 36.0–46.0)
Hemoglobin: 11.6 g/dL — ABNORMAL LOW (ref 12.0–15.0)
Lymphocytes Relative: 35 %
Lymphs Abs: 2 10*3/uL (ref 0.7–4.0)
MCH: 30.4 pg (ref 26.0–34.0)
MCHC: 32 g/dL (ref 30.0–36.0)
MCV: 95.3 fL (ref 78.0–100.0)
MONO ABS: 0.7 10*3/uL (ref 0.1–1.0)
Monocytes Relative: 11 %
Neutro Abs: 2.9 10*3/uL (ref 1.7–7.7)
Neutrophils Relative %: 49 %
PLATELETS: 247 10*3/uL (ref 150–400)
RBC: 3.81 MIL/uL — AB (ref 3.87–5.11)
RDW: 18.7 % — AB (ref 11.5–15.5)
WBC: 5.9 10*3/uL (ref 4.0–10.5)

## 2016-06-07 LAB — MAGNESIUM: MAGNESIUM: 2.3 mg/dL (ref 1.7–2.4)

## 2016-06-07 MED ORDER — PROCHLORPERAZINE EDISYLATE 5 MG/ML IJ SOLN
10.0000 mg | Freq: Once | INTRAMUSCULAR | Status: AC
Start: 2016-06-07 — End: 2016-06-07
  Administered 2016-06-07: 10 mg via INTRAVENOUS
  Filled 2016-06-07: qty 2

## 2016-06-07 MED ORDER — DIPHENHYDRAMINE HCL 50 MG/ML IJ SOLN
12.5000 mg | Freq: Once | INTRAMUSCULAR | Status: AC
Start: 2016-06-07 — End: 2016-06-07
  Administered 2016-06-07: 12.5 mg via INTRAVENOUS
  Filled 2016-06-07: qty 1

## 2016-06-07 NOTE — ED Provider Notes (Signed)
[facility] DEPT Provider Note   CSN: [id number] Arrival date & time: 06/07/16  0214     History   Chief Complaint Chief Complaint  Patient presents with  . Migraine    HPI Desiree [name] is a 41 y.o. female with a hx of asthma, ESRD on dialysis (T, TH, SAT), HTN, migraine headache, eclamptic seizures presents to the Emergency Department complaining of gradual, persistent, progressively worsening generalized headache described as a tearing sensation and rated at a 10/10 onset last night. Pt reports taking tylenol without relief.  She states she attended dialysis today without complication.  She reports associated photophobia, nausea and 6 episodes of NBNB emesis.  No alleviating factors.  Pt denies fever, chills, diplopia, blurred vision, neck stiffness, CP, SOB, abd pain, diarrhea, weakness, numbness, gait disturbance, slurred speech.     The history is provided by the patient and medical records. No language interpreter was used.    Past Medical History:  Diagnosis Date  . Childhood asthma   . Complication of anesthesia    "sometimes it does not work; didn't during [place]" (01/21/2016)  . ESRD (end stage renal disease) on dialysis ([place])    "TTS; [facility]; [facility]" (01/21/2016)  . GERD (gastroesophageal reflux disease)    nexium prn  . Gout   . History of blood transfusion    "related to kidneys; I've had 4" (01/21/2016)  . Hypertension   . Migraine    "qod now" (01/21/2016)  . Preterm labor ~ 2014  . Renal insufficiency   . Seizures ([place])    "last one was in 2000; related to preeclampsia" (01/21/2016)    Patient Active Problem List   Diagnosis Date Noted  . Dyspnea 03/07/2016  . Left arm pain 01/21/2016  . Chronic pain 01/21/2016  . Muscle cramps 03/29/2015  . Symptomatic anemia 09/15/2013  . Anemia of chronic kidney failure 09/15/2013  . Chest pain 09/08/2013  . Hyperkalemia 08/29/2013  . Abdominal pain 08/29/2013  . Nausea & vomiting 08/29/2013  .  Cocaine abuse 08/26/2013  . Renal disease in pregnancy, antepartum 08/22/2013  . Rh negative, antepartum 08/22/2013  . Missed abortion 08/22/2013  . Depression 04/21/2012  . Acute blood loss anemia 08/03/2011  . ESRD (end stage renal disease) ([place]) 08/03/2011  . Hypokalemia 08/03/2011    Past Surgical History:  Procedure Laterality Date  . AV FISTULA PLACEMENT Left 2010  . CERVICAL BIOPSY  W/ LOOP ELECTRODE EXCISION  2001  . DILATION AND EVACUATION  08/02/2011   Procedure: DILATATION AND EVACUATION;  Surgeon: Logan Bores, MD;  Location: [facility];  Service: Gynecology;;  . Brigitte Pulse AND EVACUATION N/A 08/31/2013   Procedure: DILATATION AND EVACUATION;  Surgeon: Woodroe Mode, MD;  Location: [facility];  Service: Gynecology;  Laterality: N/A;  . RENAL BIOPSY    . REVISION OF ARTERIOVENOUS GORETEX GRAFT Left 02/11/2013   Procedure: REVISION OF ARTERIOVENOUS GORTEX FISTULA;  Surgeon: Rosetta Posner, MD;  Location: [facility];  Service: Vascular;  Laterality: Left;    OB History    Gravida Para Term Preterm AB Living   6 1 1  0 4 1   SAB TAB Ectopic Multiple Live Births   2 2 0 0 1       Home Medications    Prior to Admission medications   Medication Sig Start Date End Date Taking? Authorizing Provider  albuterol (PROVENTIL HFA;VENTOLIN HFA) 108 (90 Base) MCG/ACT inhaler Inhale 2 puffs into the lungs every 4 (four) hours as needed  for wheezing or shortness of breath. 05/02/16   Forde Dandy, MD  benzonatate (TESSALON) 100 MG capsule Take 2 capsules (200 mg total) by mouth 2 (two) times daily as needed for cough. 03/08/16   Margarita Mail, PA-C  benzonatate (TESSALON) 100 MG capsule Take 1 capsule (100 mg total) by mouth every 8 (eight) hours. 05/02/16   Forde Dandy, MD  ceFAZolin (ANCEF) 2-4 GM/100ML-% IVPB Inject 100 mLs (2 g total) into the vein Every Tuesday,Thursday,and Saturday with dialysis. 01/23/16   Geradine Girt, DO  cinacalcet (SENSIPAR) 30 MG tablet Take 60 mg by mouth every  evening.     Historical Provider, MD  gabapentin (NEURONTIN) 100 MG capsule Take 300 mg by mouth 2 (two) times daily.     Historical Provider, MD  Oxycodone HCl 10 MG TABS Take 1 tablet (10 mg total) by mouth daily. 01/23/16   Geradine Girt, DO  sevelamer carbonate (RENVELA) 800 MG tablet Take 2,400 mg by mouth 3 (three) times daily with meals.    Historical Provider, MD  sodium bicarbonate 325 MG tablet Take 325 mg by mouth 2 (two) times daily.    Historical Provider, MD    Family History Family History  Problem Relation Age of Onset  . Diabetes Mother   . Hyperlipidemia Mother   . Hypertension Mother   . Heart disease Mother   . Hypertension    . Diabetes type II      Social History Social History  Substance Use Topics  . Smoking status: Passive Smoke Exposure - Never Smoker    Types: Cigarettes  . Smokeless tobacco: Never Used  . Alcohol use No     Allergies   Prednisone and Tuna [fish allergy]   Review of Systems Review of Systems  Gastrointestinal: Positive for nausea and vomiting.  Neurological: Positive for headaches.  All other systems reviewed and are negative.    Physical Exam Updated Vital Signs BP (!) 186/106 (BP Location: Right Arm)   Pulse 88   Temp 99.8 F (37.7 C) (Oral)   Resp 20   Ht 5\' 5"  (1.651 m)   Wt 86 kg   LMP 05/19/2016 (Exact Date)   SpO2 99%   BMI 31.55 kg/m   Physical Exam  Constitutional: She is oriented to person, place, and time. She appears well-developed and well-nourished. No distress.  HENT:  Head: Normocephalic and atraumatic.  Mouth/Throat: Oropharynx is clear and moist.  Eyes: Conjunctivae and EOM are normal. Pupils are equal, round, and reactive to light. No scleral icterus.  No horizontal, vertical or rotational nystagmus  Neck: Normal range of motion. Neck supple.  Full active and passive ROM without pain No midline or paraspinal tenderness No nuchal rigidity or meningeal signs  Cardiovascular: Normal rate,  regular rhythm and intact distal pulses.   Pulmonary/Chest: Effort normal and breath sounds normal. No respiratory distress. She has no wheezes. She has no rales.  Abdominal: Soft. Bowel sounds are normal. There is no tenderness. There is no rebound and no guarding.  Musculoskeletal: Normal range of motion.  Left forearm fistula with palpable thrill, no erythema, induration, lesion or increased warmth  Lymphadenopathy:    She has no cervical adenopathy.  Neurological: She is alert and oriented to person, place, and time. No cranial nerve deficit. She exhibits normal muscle tone. Coordination normal.  Mental Status:  Alert, oriented, thought content appropriate. Speech fluent without evidence of aphasia. Able to follow 2 step commands without difficulty.  Cranial Nerves:  II:  Peripheral visual fields grossly normal, pupils equal, round, reactive to light III,IV, VI: ptosis not present, extra-ocular motions intact bilaterally  V,VII: smile symmetric, facial light touch sensation equal VIII: hearing grossly normal bilaterally  IX,X: midline uvula rise  XI: bilateral shoulder shrug equal and strong XII: midline tongue extension  Motor:  5/5 in upper and lower extremities bilaterally including strong and equal grip strength and dorsiflexion/plantar flexion Sensory: Pinprick and light touch normal in all extremities.  Cerebellar: normal finger-to-nose with bilateral upper extremities Gait: normal gait and balance CV: distal pulses palpable throughout   Skin: Skin is warm and dry. No rash noted. She is not diaphoretic.  Psychiatric: She has a normal mood and affect. Her behavior is normal. Judgment and thought content normal.  Nursing note and vitals reviewed.    ED Treatments / Results  Labs (all labs ordered are listed, but only abnormal results are displayed) Labs Reviewed  CBC WITH DIFFERENTIAL/PLATELET - Abnormal; Notable for the following:       Result Value   RBC 3.81 (*)     Hemoglobin 11.6 (*)    RDW 18.7 (*)    All other components within normal limits  COMPREHENSIVE METABOLIC PANEL - Abnormal; Notable for the following:    Chloride 98 (*)    Glucose, Bld 106 (*)    BUN 43 (*)    Creatinine, Ser 12.11 (*)    AST 13 (*)    ALT 7 (*)    GFR calc non Af Amer 3 (*)    GFR calc Af Amer 4 (*)    All other components within normal limits  MAGNESIUM     Radiology Ct Head Wo Contrast  Result Date: 06/07/2016 CLINICAL DATA:  Initial evaluation for generalized headache. EXAM: CT HEAD WITHOUT CONTRAST TECHNIQUE: Contiguous axial images were obtained from the base of the skull through the vertex without intravenous contrast. COMPARISON:  Prior CT from 12/17/2014. FINDINGS: Brain: Cerebral volume normal. No evidence for acute intracranial hemorrhage. No acute large vessel territory infarct. No mass lesion, midline shift or mass effect. No hydrocephalus. No extra-axial fluid collection. Vascular: No hyperdense vessel. Skull: Scalp soft tissues within normal limits.  Calvarium intact. Sinuses/Orbits: Globes and orbital soft tissues within normal limits. Paranasal sinuses are clear. No mastoid effusion. Other: None. IMPRESSION: Normal head CT.  No acute intracranial process identified. Electronically Signed   By: Jeannine Boga M.D.   On: 06/07/2016 04:16    Procedures Procedures (including critical care time)  Medications Ordered in ED Medications  prochlorperazine (COMPAZINE) injection 10 mg (10 mg Intravenous Given 06/07/16 0300)  diphenhydrAMINE (BENADRYL) injection 12.5 mg (12.5 mg Intravenous Given 06/07/16 0300)     Initial Impression / Assessment and Plan / ED Course  I have reviewed the triage vital signs and the nursing notes.  Pertinent labs & imaging results that were available during my care of the patient were reviewed by me and considered in my medical decision making (see chart for details).  Clinical Course as of Jun 08 546  Sun Jun 07, 2016   0353 Patient reports significant improvement in her headache. It is not completely resolved but she does not wish for additional pain medication.  [HM]    Clinical Course User Index [HM] Jarrett Soho Roni Friberg, PA-C    Presents with headache. She is a dialysis patient. Gradual onset but more intense than she has ever had before. CT scan without acute abnormality including no intracranial hemorrhage. Labs are reassuring. Serum creatinine  at baseline. Patient treated with migraine cocktail with significant improvement in her headache. Normal neurologic exam. Doubt CVA. Patient will be discharged home to follow-up with primary care provider. Patient and significant other state understanding and agreement with the plan.  The patient was discussed with and seen by Dr. Venora Maples who agrees with the treatment plan.   Final Clinical Impressions(s) / ED Diagnoses   Final diagnoses:  Migraine without aura and without status migrainosus, not intractable  ESRD (end stage renal disease) [facility])    New Prescriptions Discharge Medication List as of 06/07/2016  4:42 AM       Abigail Butts, PA-C 06/07/16 [place], MD 06/08/16 [phone]

## 2016-06-07 NOTE — ED Triage Notes (Signed)
Pt c/o generalized HA that radiates down her neck.  +photophobia.  +nausea/vomiting.  Pt states that the HA started yesterday, however has gotten progressively worse.  Pt had dialysis yesterday.  Pt rates pain 10/10, sharp and throbbing in nature.  Pt states, "My eyeballs feel like they want to come out."

## 2016-08-03 ENCOUNTER — Encounter (HOSPITAL_COMMUNITY): Payer: Self-pay | Admitting: Emergency Medicine

## 2016-08-03 ENCOUNTER — Observation Stay (HOSPITAL_COMMUNITY)
Admission: EM | Admit: 2016-08-03 | Discharge: 2016-08-04 | Disposition: A | Discharge status: Home / Self Care | Payer: Medicare Other | Attending: Internal Medicine | Admitting: Internal Medicine

## 2016-08-03 ENCOUNTER — Emergency Department (HOSPITAL_COMMUNITY): Payer: Medicare Other

## 2016-08-03 DIAGNOSIS — R1031 Right lower quadrant pain: Secondary | ICD-10-CM | POA: Diagnosis not present

## 2016-08-03 DIAGNOSIS — Z992 Dependence on renal dialysis: Secondary | ICD-10-CM | POA: Insufficient documentation

## 2016-08-03 DIAGNOSIS — E8770 Fluid overload, unspecified: Secondary | ICD-10-CM | POA: Diagnosis not present

## 2016-08-03 DIAGNOSIS — E875 Hyperkalemia: Secondary | ICD-10-CM | POA: Diagnosis not present

## 2016-08-03 DIAGNOSIS — N186 End stage renal disease: Secondary | ICD-10-CM | POA: Diagnosis not present

## 2016-08-03 DIAGNOSIS — I120 Hypertensive chronic kidney disease with stage 5 chronic kidney disease or end stage renal disease: Principal | ICD-10-CM | POA: Insufficient documentation

## 2016-08-03 DIAGNOSIS — Z7722 Contact with and (suspected) exposure to environmental tobacco smoke (acute) (chronic): Secondary | ICD-10-CM | POA: Insufficient documentation

## 2016-08-03 DIAGNOSIS — E8779 Other fluid overload: Secondary | ICD-10-CM

## 2016-08-03 DIAGNOSIS — Z79899 Other long term (current) drug therapy: Secondary | ICD-10-CM | POA: Diagnosis not present

## 2016-08-03 DIAGNOSIS — R0602 Shortness of breath: Secondary | ICD-10-CM | POA: Diagnosis present

## 2016-08-03 DIAGNOSIS — N19 Unspecified kidney failure: Secondary | ICD-10-CM

## 2016-08-03 DIAGNOSIS — R109 Unspecified abdominal pain: Secondary | ICD-10-CM | POA: Diagnosis present

## 2016-08-03 LAB — CBC WITH DIFFERENTIAL/PLATELET
BASOS ABS: 0 10*3/uL (ref 0.0–0.1)
BASOS PCT: 0 %
Eosinophils Absolute: 0.2 10*3/uL (ref 0.0–0.7)
Eosinophils Relative: 3 %
HEMATOCRIT: 33.4 % — AB (ref 36.0–46.0)
HEMOGLOBIN: 10.7 g/dL — AB (ref 12.0–15.0)
LYMPHS PCT: 26 %
Lymphs Abs: 1.9 10*3/uL (ref 0.7–4.0)
MCH: 28.4 pg (ref 26.0–34.0)
MCHC: 32 g/dL (ref 30.0–36.0)
MCV: 88.6 fL (ref 78.0–100.0)
MONO ABS: 0.5 10*3/uL (ref 0.1–1.0)
Monocytes Relative: 7 %
NEUTROS ABS: 4.9 10*3/uL (ref 1.7–7.7)
NEUTROS PCT: 64 %
Platelets: 180 10*3/uL (ref 150–400)
RBC: 3.77 MIL/uL — AB (ref 3.87–5.11)
RDW: 17.5 % — AB (ref 11.5–15.5)
WBC: 7.5 10*3/uL (ref 4.0–10.5)

## 2016-08-03 LAB — COMPREHENSIVE METABOLIC PANEL
ALBUMIN: 3.6 g/dL (ref 3.5–5.0)
ALT: 27 U/L (ref 14–54)
AST: 39 U/L (ref 15–41)
Alkaline Phosphatase: 80 U/L (ref 38–126)
Anion gap: 23 — ABNORMAL HIGH (ref 5–15)
BILIRUBIN TOTAL: 1.4 mg/dL — AB (ref 0.3–1.2)
BUN: 135 mg/dL — AB (ref 6–20)
CO2: 15 mmol/L — ABNORMAL LOW (ref 22–32)
CREATININE: 26.92 mg/dL — AB (ref 0.44–1.00)
Calcium: 7.6 mg/dL — ABNORMAL LOW (ref 8.9–10.3)
Chloride: 96 mmol/L — ABNORMAL LOW (ref 101–111)
GFR calc Af Amer: 2 mL/min — ABNORMAL LOW (ref >60)
GFR calc non Af Amer: 1 mL/min — ABNORMAL LOW (ref >60)
GLUCOSE: 84 mg/dL (ref 65–99)
Sodium: 134 mmol/L — ABNORMAL LOW (ref 135–145)
TOTAL PROTEIN: 7.2 g/dL (ref 6.5–8.1)

## 2016-08-03 LAB — I-STAT CHEM 8, ED
BUN: 136 mg/dL — ABNORMAL HIGH (ref 6–20)
Calcium, Ion: 0.83 mmol/L — CL (ref 1.15–1.40)
Chloride: 102 mmol/L (ref 101–111)
Creatinine, Ser: >18 mg/dL — ABNORMAL HIGH (ref 0.44–1.00)
Glucose, Bld: 72 mg/dL (ref 65–99)
HEMATOCRIT: 34 % — AB (ref 36.0–46.0)
HEMOGLOBIN: 11.6 g/dL — AB (ref 12.0–15.0)
POTASSIUM: 7 mmol/L — AB (ref 3.5–5.1)
Sodium: 133 mmol/L — ABNORMAL LOW (ref 135–145)
TCO2: 19 mmol/L (ref 0–100)

## 2016-08-03 LAB — MRSA PCR SCREENING: MRSA BY PCR: INVALID — AB

## 2016-08-03 LAB — I-STAT TROPONIN, ED: Troponin i, poc: 0 ng/mL (ref 0.00–0.08)

## 2016-08-03 LAB — I-STAT BETA HCG BLOOD, ED (MC, WL, AP ONLY): HCG, QUANTITATIVE: 9 m[IU]/mL — AB (ref <5)

## 2016-08-03 LAB — CBG MONITORING, ED: Glucose-Capillary: 77 mg/dL (ref 65–99)

## 2016-08-03 LAB — HCG, QUANTITATIVE, PREGNANCY: HCG, BETA CHAIN, QUANT, S: 3 m[IU]/mL (ref <5)

## 2016-08-03 LAB — LIPASE, BLOOD: Lipase: 60 U/L — ABNORMAL HIGH (ref 11–51)

## 2016-08-03 MED ORDER — ALBUTEROL SULFATE (2.5 MG/3ML) 0.083% IN NEBU
5.0000 mg | INHALATION_SOLUTION | Freq: Once | RESPIRATORY_TRACT | Status: AC
  Administered 2016-08-03: 5 mg via RESPIRATORY_TRACT
  Filled 2016-08-03: qty 6

## 2016-08-03 MED ORDER — MORPHINE SULFATE (PF) 4 MG/ML IV SOLN
4.0000 mg | Freq: Once | INTRAVENOUS | Status: AC
  Administered 2016-08-03: 4 mg via INTRAVENOUS
  Filled 2016-08-03: qty 1

## 2016-08-03 MED ORDER — OXYCODONE HCL 5 MG PO TABS
10.0000 mg | ORAL_TABLET | Freq: Three times a day (TID) | ORAL | Status: DC | PRN
  Administered 2016-08-03 – 2016-08-04 (×2): 10 mg via ORAL

## 2016-08-03 MED ORDER — HYDROMORPHONE HCL 1 MG/ML IJ SOLN
1.0000 mg | INTRAMUSCULAR | Status: DC | PRN
  Administered 2016-08-04: 1 mg via INTRAVENOUS
  Filled 2016-08-03: qty 1

## 2016-08-03 MED ORDER — MOMETASONE FURO-FORMOTEROL FUM 100-5 MCG/ACT IN AERO
2.0000 | INHALATION_SPRAY | Freq: Two times a day (BID) | RESPIRATORY_TRACT | Status: DC
  Administered 2016-08-03 – 2016-08-04 (×2): 2 via RESPIRATORY_TRACT
  Filled 2016-08-03: qty 8.8

## 2016-08-03 MED ORDER — SODIUM CHLORIDE 0.9 % IV SOLN
1.0000 g | Freq: Once | INTRAVENOUS | Status: AC
  Administered 2016-08-03: 1 g via INTRAVENOUS
  Filled 2016-08-03: qty 10

## 2016-08-03 MED ORDER — MOMETASONE FURO-FORMOTEROL FUM 100-5 MCG/ACT IN AERO: 2.0000 | INHALATION_SPRAY | Freq: Two times a day (BID) | RESPIRATORY_TRACT | Status: DC

## 2016-08-03 MED ORDER — HYDROMORPHONE HCL 1 MG/ML IJ SOLN: 1.0000 mg | INTRAMUSCULAR | Status: DC | PRN

## 2016-08-03 MED ORDER — KETOROLAC TROMETHAMINE 15 MG/ML IJ SOLN
15.0000 mg | Freq: Three times a day (TID) | INTRAMUSCULAR | Status: DC | PRN
  Administered 2016-08-03: 15 mg via INTRAVENOUS
  Filled 2016-08-03: qty 1

## 2016-08-03 MED ORDER — SODIUM BICARBONATE 650 MG PO TABS: 325.0000 mg | ORAL_TABLET | Freq: Two times a day (BID) | ORAL | Status: DC

## 2016-08-03 MED ORDER — DIPHENHYDRAMINE HCL 25 MG PO CAPS
ORAL_CAPSULE | ORAL | Status: AC
  Administered 2016-08-03: 25 mg via ORAL
  Filled 2016-08-03: qty 1

## 2016-08-03 MED ORDER — CALCIUM ACETATE (PHOS BINDER) 667 MG PO CAPS
1334.0000 mg | ORAL_CAPSULE | Freq: Three times a day (TID) | ORAL | Status: DC
  Administered 2016-08-03 – 2016-08-04 (×3): 1334 mg via ORAL
  Filled 2016-08-03 (×3): qty 2

## 2016-08-03 MED ORDER — ONDANSETRON HCL 4 MG/2ML IJ SOLN
4.0000 mg | Freq: Once | INTRAMUSCULAR | Status: AC
  Administered 2016-08-03: 4 mg via INTRAVENOUS
  Filled 2016-08-03: qty 2

## 2016-08-03 MED ORDER — HEPARIN SODIUM (PORCINE) 5000 UNIT/ML IJ SOLN
5000.0000 [IU] | Freq: Three times a day (TID) | INTRAMUSCULAR | Status: DC
  Administered 2016-08-04: 5000 [IU] via SUBCUTANEOUS
  Filled 2016-08-03 (×3): qty 1

## 2016-08-03 MED ORDER — CINACALCET HCL 30 MG PO TABS
60.0000 mg | ORAL_TABLET | Freq: Every evening | ORAL | Status: DC
  Filled 2016-08-03: qty 2

## 2016-08-03 MED ORDER — SEVELAMER CARBONATE 800 MG PO TABS
2400.0000 mg | ORAL_TABLET | Freq: Three times a day (TID) | ORAL | Status: DC
  Administered 2016-08-03 – 2016-08-04 (×3): 2400 mg via ORAL
  Filled 2016-08-03 (×3): qty 3

## 2016-08-03 MED ORDER — KETOROLAC TROMETHAMINE 30 MG/ML IJ SOLN: 30.0000 mg | Freq: Three times a day (TID) | INTRAMUSCULAR | Status: DC | PRN

## 2016-08-03 MED ORDER — ONDANSETRON HCL 4 MG/2ML IJ SOLN: 4.0000 mg | Freq: Three times a day (TID) | INTRAMUSCULAR | Status: DC | PRN

## 2016-08-03 MED ORDER — SODIUM BICARBONATE 8.4 % IV SOLN
50.0000 meq | Freq: Once | INTRAVENOUS | Status: AC
  Administered 2016-08-03: 50 meq via INTRAVENOUS
  Filled 2016-08-03: qty 50

## 2016-08-03 MED ORDER — OXYCODONE HCL 5 MG PO TABS
ORAL_TABLET | ORAL | Status: AC
  Administered 2016-08-03: 10 mg via ORAL
  Filled 2016-08-03: qty 2

## 2016-08-03 MED ORDER — DEXTROSE 50 % IV SOLN
1.0000 | Freq: Once | INTRAVENOUS | Status: AC
  Administered 2016-08-03: 50 mL via INTRAVENOUS
  Filled 2016-08-03: qty 50

## 2016-08-03 MED ORDER — DIPHENHYDRAMINE HCL 25 MG PO CAPS
25.0000 mg | ORAL_CAPSULE | Freq: Once | ORAL | Status: AC
  Administered 2016-08-03: 25 mg via ORAL

## 2016-08-03 MED ORDER — INSULIN ASPART 100 UNIT/ML ~~LOC~~ SOLN
10.0000 [IU] | Freq: Once | SUBCUTANEOUS | Status: AC
  Administered 2016-08-03: 10 [IU] via INTRAVENOUS
  Filled 2016-08-03: qty 1

## 2016-08-03 NOTE — ED Notes (Signed)
CRITICAL VALUE ALERT  Critical value received:  K >7.5  Date of notification: 08/03/2016  Time of notification: 0840  Critical value read back:YES   Nurse who received alert:  Earleen Newport RN   MD notified:MD Maryan Rued

## 2016-08-03 NOTE — ED Triage Notes (Signed)
Pt sts increased SOB and pain in legs and abd area; pt sts hasn't been to dialysis since last Tuesday due to diarrhea

## 2016-08-03 NOTE — Progress Notes (Signed)
08/03/2016 3:36 PM  New Admission Note:   Arrival Method: Vis stretcher from ED Mental Orientation: Alert and oriented X4 Telemetry: No orders Assessment: Completed Skin: Warm, dry and intact  IV: Clean, dry and intact  Pain: none stated at this time Tubes: N/A Safety Measures: Safety Fall Prevention Plan has been given, discussed and signed Admission: Updating [address] [place] Orientation: Patient has been orientated to the room, unit and staff.  Family: Female at bedside   Orders have been reviewed and implemented. Will continue to monitor the patient. Call light has been placed within reach and bed alarm has been activated.   Tome Wilson BSN, RN-BC [employer]  Phone number: [phone]

## 2016-08-03 NOTE — Procedures (Signed)
I have personally managed this patient's dialysis session.   0K bath for 30 minutes, then 1K remainder of Rx for K 7.  HD again tomorrow on usual TTS schedule  Jamal Maes, MD [facility] [phone] Pager 08/03/2016, 2:11 PM

## 2016-08-03 NOTE — H&P (Signed)
Date: 08/03/2016               Patient Name:  Desiree [name] MRN: [id number]  DOB: 1976-03-08 Age / Sex: 41 y.o., female   PCP: Fleet Contras, MD         Medical Service: Internal Medicine Teaching Service         Attending Physician: Dr. Aldine Contes, MD    First Contact: Dr. Inda Castle Pager: [phone]  Second Contact: Dr. Benjamine Mola Pager: [phone]       After Hours (After 5p/  First Contact Pager: [phone]  weekends / holidays): Second Contact Pager: [phone]   Chief Complaint: Diarrhea and abdominal pain  History of Present Illness: Desiree [name] is a 41yo woman with PMHx of ESRD on HD TTS, HTN, and seizures presenting with diarrhea and abdominal pain. Reports the diarrhea started about 5 days ago and was occurring every 30 minutes. She describes having frequent watery stools that would come out uncontrollably at times. She denies any fevers, melena, or hematochezia. She denies any recent antibiotic use. She had to miss her last 2 dialysis sessions due to the profuse diarrhea so her last HD session was 6 days ago. She notes the diarrhea stopped yesterday and she has not been passing gas today. She reports her abdominal pain started yesterday and she describes it as located on the right upper-middle abdominal area, 8/10 in severity, "a nagging ache" with a pulling sensation, non-radiating, and associated with nausea and vomiting. She vomited twice yesterday which was non-bloody and non-bilious. She received morphine in the ED which did relieve the pain temporarily. She reports poor appetite the past few days due to her symptoms. She is also describing shortness of breath that has been ongoing for the past 2-3 days. She notices the SOB any time she exerts herself. She notes associated chest soreness and a nonproductive cough. She also notes sharp "nerve pain" in her legs that started a few days ago. She states she has had the pain before but never this severe. Denies any swelling or  redness in her legs.  In the ED, she was found to be hyperkalemic at 7.0. EKG showed peaked T waves. Renal was consulted for emergent dialysis and patient was undergoing HD at time of evaluation.   Meds:  Current Meds  Medication Sig  . albuterol (PROVENTIL HFA;VENTOLIN HFA) 108 (90 Base) MCG/ACT inhaler Inhale 2 puffs into the lungs every 4 (four) hours as needed for wheezing or shortness of breath.  Marland Kitchen ceFAZolin (ANCEF) 2-4 GM/100ML-% IVPB Inject 100 mLs (2 g total) into the vein Every Tuesday,Thursday,and Saturday with dialysis.  Marland Kitchen cinacalcet (SENSIPAR) 30 MG tablet Take 60 mg by mouth every evening.   . fluticasone-salmeterol (ADVAIR HFA) 45-21 MCG/ACT inhaler Inhale 2 puffs into the lungs 2 (two) times daily.  Marland Kitchen gabapentin (NEURONTIN) 100 MG capsule Take 300 mg by mouth 2 (two) times daily.   . Oxycodone HCl 10 MG TABS Take 1 tablet (10 mg total) by mouth daily. (Patient taking differently: Take 10 mg by mouth every 8 (eight) hours as needed (pain). )  . sevelamer carbonate (RENVELA) 800 MG tablet Take 2,400 mg by mouth 3 (three) times daily with meals.  . sodium bicarbonate 325 MG tablet Take 325 mg by mouth 2 (two) times daily.     Allergies: Allergies as of 08/03/2016 - Review Complete 08/03/2016  Allergen Reaction Noted  . Morphine Shortness Of Breath and Anaphylaxis 08/31/2013  . Prednisone Other (See Comments)  01/05/2011  . Tuna [fish allergy] Itching, Swelling, Rash, and Other (See Comments) 09/14/2013  . Tape Itching 03/31/2012   Past Medical History:  Diagnosis Date  . Childhood asthma   . Complication of anesthesia    "sometimes it does not work; didn't during [place]" (01/21/2016)  . ESRD (end stage renal disease) on dialysis ([place])    "TTS; [facility]; [facility]" (01/21/2016)  . GERD (gastroesophageal reflux disease)    nexium prn  . Gout   . History of blood transfusion    "related to kidneys; I've had 4" (01/21/2016)  . Hypertension   . Migraine    "qod  now" (01/21/2016)  . Preterm labor ~ 2014  . Renal insufficiency   . Seizures ([place])    "last one was in 2000; related to preeclampsia" (01/21/2016)    Family History: Mother- HTN, DM, hyperlipidemia, heart disease  Social History: Lives at home with husband and daughter. Denies any alcohol or tobacco use. Admits smoking marijuana daily. Denies any IV drug use.   Review of Systems: A complete ROS was negative except as per HPI.   Physical Exam: Blood pressure (!) 184/96, pulse 79, temperature 99.4 F (37.4 C), temperature source Oral, resp. rate (!) 22, last menstrual period 07/16/2016, SpO2 100 %. General: Obese woman in NAD HEENT: EOMI, sclera anicteric, mucus membranes moist CV: RRR, no m/g/r Pulm: Crackles noted at bases bilaterally, breaths non-labored on room air Abd: BS+, soft, obese, mild tenderness in right mid-abdomen, no masses palpated, no guarding or rebound Ext: warm, no peripheral edema, distal pulses 2+ Neuro: alert and oriented x 3, strength 5/5 in upper and lower extremities   Labs: Na 134 K >7.5 (hemolyzed), repeat 7.0 Cl 96 Bicarb 15 BUN 135 Cr 26.9 TBili 1.4 AG 23 Lipase 60 Troponin 0 Beta hCG 9.0, quantitative    EKG: Sinus rhythm. Peaked T waves in anterolateral leads.  Abd w/ chest x-ray: No evidence of bowel obstruction or ileus. Mild central pulmonary vascular congestion is noted with possible bilateral pulmonary edema.  There is the suggestion of rounded density or mass seen in the left upper quadrant of the abdomen. Further evaluation with ultrasound or CT scan is recommended.  Assessment & Plan by Problem:  Diarrhea and Right-sided Abdominal pain: Patient presenting with a 5 day hx of profuse diarrhea and a 2 day hx of RLQ abdominal pain. Her diarrhea has resolved, but now she is reporting not passing gas. There is no evidence for SBO on abdominal film. No recent antibiotics. Would not pursue any stool studies at this time unless diarrhea  reoccurs. Will obtain an abdominal US since her beta hCG is positive to further evaluate her abdominal pain as well as the rounded density in the LUQ noted on plain film. Her lipase is slightly elevated but do not think this is pancreatitis as no epigastric tenderness and has been tolerating PO intake. Her bilirubin is mildly elevated but other LFTs are within normal limits. Her CT abdomen/pelvis in 2015 did not show evidence of gallstones. She is afebrile and WBC count within normal limits so infection seems less likely at this point. Her abdominal pain could be related to her hyperkalemia.  - f/u abd [place] - Check LFTs in AM - Zofran PRN - Monitor stool output   Hyperkalemia: Initial K >7.5 but was hemolyzed. Repeat K 7.0. EKG with some evidence of peaked T waves in lateral leads. Renal to take for emergent HD. - HD per Renal - Repeat bmet in  AM  Anion Gap Metabolic Acidosis: AG 23 on admission with serum bicarb 15. Most likely due to her ESRD and missing HD sessions. Should improve with HD. - bmet in AM  ESRD on HD: On TTS schedule, but has missed last 2 sessions due to diarrhea. Per Renal, she apparently cuts all of her HD sessions short. Getting emergent HD now for her hyperkalemia. Will get HD again tomorrow to resume her normal schedule.  - HD per Renal, appreciate recommendations - For metabolic bone disease: Sensipar, Renvela - Continue oral bicarb   Hypertensive: BPs in 096G-836O systolic. Likely due to volume overload from missing HD. Should improve after HD.  - Monitor closely   Elevated lipase: Lipase 60 on admission. Denies alcohol use. Obtaining abdominal US to further evaluate abdominal pain but seems very unlikely to be pancreatitis at this time.  - f/u abd [place]  Normocytic Anemia: Hgb 10.7 on admission. Secondary to ESRD. Getting iron load with Renal.  - Continue to monitor    Diet: Clears, advance as tolerated  DVT PPx: Heparin SQ Dispo: Admit patient to Observation with  expected length of stay less than 2 midnights.  Signed: Juliet Rude, MD 08/03/2016, 10:26 AM  Pager: [phone]

## 2016-08-03 NOTE — Consult Note (Addendum)
[facility] Renal Consultation Note    Indication for Consultation:  Management of ESRD/hemodialysis; anemia, hypertension/volume and secondary hyperparathyroidism  HPI: Desiree [name] is a 41 y.o. female with ESRD on HD, HTN, seizure disorder. She presented to [facility] ED this morning with abdominal pain, leg pain, and SOB. She missed her last two HD treatments due to diarrhea that began last week. Diarrhea resolved yesterday. This morning she reports worsening dyspnea that prompted her to go the ED. She also endorses right-sided abdominal pain with two episodes of vomiting yesterday.  ED course was significant for K >7.5, repeat K 7, CO2 15  EKG with peaked T waves. Patient transferred for urgent HD. Xray with pulm vascular congestion and rounded density in LUQ of abdomen. She is being admitted under observation status for further evaluation.   Seen now on HD. Wearing nasal oxygen and reports that her breathing has improved significantly. Still c/o of bilateral leg pain "feels like someone is trying to pull my legs off". C/o RUQ abdominal and of eating graham crackers on HD. Says chest feels sore from coughing.  Pain improved some with morphine in ED. Denies fever, nausea, vomiting, diarrhea today.   Dialyzes at [place]. She has a history of poor compliance with HD. Her last treatment was on Wed 5/15 for 3 hours 16 mins.   Past Medical History:  Diagnosis Date  . Childhood asthma   . Complication of anesthesia    "sometimes it does not work; didn't during [place]" (01/21/2016)  . ESRD (end stage renal disease) on dialysis ([place])    "TTS; [facility]; [facility]" (01/21/2016)  . GERD (gastroesophageal reflux disease)    nexium prn  . Gout   . History of blood transfusion    "related to kidneys; I've had 4" (01/21/2016)  . Hypertension   . Migraine    "qod now" (01/21/2016)  . Preterm labor ~ 2014  . Renal insufficiency   . Seizures ([place])     "last one was in 2000; related to preeclampsia" (01/21/2016)   Past Surgical History:  Procedure Laterality Date  . AV FISTULA PLACEMENT Left 2010  . CERVICAL BIOPSY  W/ LOOP ELECTRODE EXCISION  2001  . DILATION AND EVACUATION  08/02/2011   Procedure: DILATATION AND EVACUATION;  Surgeon: Logan Bores, MD;  Location: [facility];  Service: Gynecology;;  . Brigitte Pulse AND EVACUATION N/A 08/31/2013   Procedure: DILATATION AND EVACUATION;  Surgeon: Woodroe Mode, MD;  Location: [facility];  Service: Gynecology;  Laterality: N/A;  . RENAL BIOPSY    . REVISION OF ARTERIOVENOUS GORETEX GRAFT Left 02/11/2013   Procedure: REVISION OF ARTERIOVENOUS GORTEX FISTULA;  Surgeon: Rosetta Posner, MD;  [facility];  Service: Vascular;  Laterality: Left;   Family History  Problem Relation Age of Onset  . Diabetes Mother   . Hyperlipidemia Mother   . Hypertension Mother   . Heart disease Mother   . Hypertension Unknown   . Diabetes type II Unknown    Social History:  reports that she is a non-smoker but has been exposed to tobacco smoke. She has never used smokeless tobacco. She reports that she uses drugs, including Marijuana. She reports that she does not drink alcohol. Allergies  Allergen Reactions  . Morphine Shortness Of Breath and Anaphylaxis  . Prednisone Other (See Comments)    Other reaction(s): Other (See Comments) Muscle spasms Patient says prednisone causes her to cramp all over, muscle spasms uncontrolled  .  Geralyn Flash [Fish Allergy] Itching, Swelling, Rash and Other (See Comments)    Face droops also  . Tape Itching   Prior to Admission medications   Medication Sig Start Date End Date Taking? Authorizing Provider  albuterol (PROVENTIL HFA;VENTOLIN HFA) 108 (90 Base) MCG/ACT inhaler Inhale 2 puffs into the lungs every 4 (four) hours as needed for wheezing or shortness of breath. 05/02/16  Yes Forde Dandy, MD  ceFAZolin (ANCEF) 2-4 GM/100ML-% IVPB Inject 100 mLs (2 g total) into the vein Every  Tuesday,Thursday,and Saturday with dialysis. 01/23/16  Yes Geradine Girt, DO  cinacalcet (SENSIPAR) 30 MG tablet Take 60 mg by mouth every evening.    Yes [provider]  fluticasone-salmeterol (ADVAIR HFA) 45-21 MCG/ACT inhaler Inhale 2 puffs into the lungs 2 (two) times daily.   Yes [provider]  gabapentin (NEURONTIN) 100 MG capsule Take 300 mg by mouth 2 (two) times daily.    Yes [provider]  Oxycodone HCl 10 MG TABS Take 1 tablet (10 mg total) by mouth daily. Patient taking differently: Take 10 mg by mouth every 8 (eight) hours as needed (pain).  01/23/16  Yes Eulogio Bear U, DO  sevelamer carbonate (RENVELA) 800 MG tablet Take 2,400 mg by mouth 3 (three) times daily with meals.   Yes [provider]  sodium bicarbonate 325 MG tablet Take 325 mg by mouth 2 (two) times daily.   Yes [provider]  benzonatate (TESSALON) 100 MG capsule Take 2 capsules (200 mg total) by mouth 2 (two) times daily as needed for cough. Patient not taking: Reported on 08/03/2016 03/08/16   Margarita Mail, PA-C  benzonatate (TESSALON) 100 MG capsule Take 1 capsule (100 mg total) by mouth every 8 (eight) hours. Patient not taking: Reported on 08/03/2016 05/02/16   Forde Dandy, MD   Current Facility-Administered Medications  Medication Dose Route Frequency Provider Last Rate Last Dose  . cinacalcet (SENSIPAR) tablet 60 mg  60 mg Oral QPM Rivet, Carly J, MD      . heparin injection 5,000 Units  5,000 Units Subcutaneous Q8H Rivet, Carly J, MD      . HYDROmorphone (DILAUDID) injection 1 mg  1 mg Intravenous Q4H PRN Rivet, Carly J, MD      . ketorolac (TORADOL) 30 MG/ML injection 30 mg  30 mg Intravenous Q8H PRN Rivet, Carly J, MD      . mometasone-formoterol (DULERA) 100-5 MCG/ACT inhaler 2 puff  2 puff Inhalation BID Rivet, Carly J, MD      . ondansetron (ZOFRAN) injection 4 mg  4 mg Intravenous Q8H PRN Rivet, Carly J, MD      . Oxycodone HCl TABS 10 mg  10 mg Oral  Q8H PRN Rivet, Carly J, MD      . sevelamer carbonate (RENVELA) tablet 2,400 mg  2,400 mg Oral TID WC Rivet, Carly J, MD      . sodium bicarbonate tablet 325 mg  325 mg Oral BID Rivet, Sindy Guadel[place], MD       Current Outpatient Prescriptions  Medication Sig Dispense Refill  . albuterol (PROVENTIL HFA;VENTOLIN HFA) 108 (90 Base) MCG/ACT inhaler Inhale 2 puffs into the lungs every 4 (four) hours as needed for wheezing or shortness of breath. 1 Inhaler 2  . ceFAZolin (ANCEF) 2-4 GM/100ML-% IVPB Inject 100 mLs (2 g total) into the vein Every Tuesday,Thursday,and Saturday with dialysis. 1 each   . cinacalcet (SENSIPAR) 30 MG tablet Take 60 mg by mouth every evening.     Marland Kitchen  fluticasone-salmeterol (ADVAIR HFA) 45-21 MCG/ACT inhaler Inhale 2 puffs into the lungs 2 (two) times daily.    Marland Kitchen gabapentin (NEURONTIN) 100 MG capsule Take 300 mg by mouth 2 (two) times daily.     . Oxycodone HCl 10 MG TABS Take 1 tablet (10 mg total) by mouth daily. (Patient taking differently: Take 10 mg by mouth every 8 (eight) hours as needed (pain). ) 3 tablet 0  . sevelamer carbonate (RENVELA) 800 MG tablet Take 2,400 mg by mouth 3 (three) times daily with meals.    . sodium bicarbonate 325 MG tablet Take 325 mg by mouth 2 (two) times daily.    . benzonatate (TESSALON) 100 MG capsule Take 2 capsules (200 mg total) by mouth 2 (two) times daily as needed for cough. (Patient not taking: Reported on 08/03/2016) 20 capsule 0  . benzonatate (TESSALON) 100 MG capsule Take 1 capsule (100 mg total) by mouth every 8 (eight) hours. (Patient not taking: Reported on 08/03/2016) 21 capsule 0     ROS: As per HPI otherwise negative.  Physical Exam: Vitals:   08/03/16 0930 08/03/16 1030 08/03/16 1038 08/03/16 1100  BP: (!) 184/96 (!) 169/64 (!) 183/99 (!) 130/107  Pulse: 79 90 89 88  Resp:  (!) 24 (!) 26 (!) 26  Temp:      TempSrc:  Oral    SpO2: 100% 99% 99% 99%  Weight:  92.2 kg (203 lb 4.2 oz)       General: WDWN AAF wearing nasal  oxygen NAD Head: NCAT sclera not icteric MMM Neck: Supple. No JVD No masses Lungs: Breathing is unlabored. Faint crackles at left base  Heart: RRR with S1 S2 Abdomen: soft NT mild tenderness to palpation RUQ  Lower extremities:without edema or ischemic changes, no open wounds  Neuro: A & O  X 3. Moves all extremities spontaneously. Psych:  Responds to questions appropriately with a normal affect. Dialysis Access: LUE AVF cannulated on HD   Labs: Basic Metabolic Panel:  Recent Labs Lab 08/03/16 0739 08/03/16 0851  NA 134* 133*  K >7.5* 7.0*  CL 96* 102  CO2 15*  --   GLUCOSE 84 72  BUN 135* 136*  CREATININE 26.92* >18.00*  CALCIUM 7.6*  --    Liver Function Tests:  Recent Labs Lab 08/03/16 0739  AST 39  ALT 27  ALKPHOS 80  BILITOT 1.4*  PROT 7.2  ALBUMIN 3.6    Recent Labs Lab 08/03/16 0739  LIPASE 60*   No results for input(s): AMMONIA in the last 168 hours. CBC:  Recent Labs Lab 08/03/16 0739 08/03/16 0851  WBC 7.5  --   NEUTROABS 4.9  --   HGB 10.7* 11.6*  HCT 33.4* 34.0*  MCV 88.6  --   PLT 180  --    Cardiac Enzymes: No results for input(s): CKTOTAL, CKMB, CKMBINDEX, TROPONINI in the last 168 hours. CBG:  Recent Labs Lab 08/03/16 0856  GLUCAP 77   Iron Studies: No results for input(s): IRON, TIBC, TRANSFERRIN, FERRITIN in the last 72 hours. Studies/Results: Dg Abd Acute W/chest  Result Date: 08/03/2016 CLINICAL DATA:  Abdominal distention, nausea. EXAM: DG ABDOMEN ACUTE W/ 1V CHEST COMPARISON:  Radiographs of May 02, 2016. CT scan of December 20, 2013. FINDINGS: Stable cardiomediastinal silhouette. No pneumothorax is noted. Mild central pulmonary vascular congestion and probable bilateral pulmonary edema is noted. No pneumoperitoneum is noted. No abnormal bowel gas pattern is noted. Phlebolith is noted in the pelvis. There is the suggestion of her  rounded mass in the left upper quadrant. IMPRESSION: No evidence of bowel obstruction or  ileus. Mild central pulmonary vascular congestion is noted with possible bilateral pulmonary edema. There is the suggestion of rounded density or mass seen in the left upper quadrant of the abdomen. Further evaluation with ultrasound or CT scan is recommended. Electronically Signed   By: Marijo Conception, M.D.   On: 08/03/2016 09:07    Dialysis Orders:  [facility] TThS 3:77min 180F BFR 400/800 1K/2.5Ca EDW 86kg  -Heparin 8500 U IV bolus  1000 U IV midtreatment  -Hectorol 7 mcg IV q HD -Venofer 100mg  IV q HD x 10 (until 6/7) OP Labs - Hgb 11.7 Tsat 16%  Ca 8.4 P 8.8 PTH 459   Assessment/Plan: 1. Hyperkalemia secondary to poor compliance with dialysis - Last HD 5/15 - HD emergently today and follow K. Suspect leg pain may be related to ^K  2. RUQ abdominal pain - Lipase slg elevated - Ab xray with rounded density in LUQ, CT recommended - w/u per primary  3.  ESRD -  TTS - off schedule today and tomorrow back on schedule  4.  Hypertension/volume  - BP elevated no home meds- should improve with HD / mild pulm edema on xray UF goal 5L - follow weights  5.  Anemia  - Hgb 11.6 Not on ESA / Cont Fe load  6.  Metabolic bone disease -  Poor compliance with binders as outpatient - Cont VDRA/Sensipar/Ca acetate and sevelamer binder - follow renal panel  7.  Nutrition - Renal diet/vitamins   Lynnda Child [facility] Pager [phone] 08/03/2016, 12:34 PM   I have seen and examined this patient and agree with plan and assessment in the above note with renal recommendations/intervention highlighted. Urgent HD off schedule for life threatening hyperkalemia after no HD for a week. HD again tomorrow to get on schedule. Abd pain eval per primary service.    Sebastion Jun B,MD 08/03/2016 2:08 PM

## 2016-08-03 NOTE — Progress Notes (Signed)
[facility] Brief Note (Full note to follow)  Rec'd  Call from ED re this patient, ESRD on TTS HD [place] Last HD was 1 week ago, missed reportedly related to diarrhea Appears she shortens ALL her HD tmts Initial K >7.5 hemolyzed, repeat 7 no hemolysis (pt with outpt K issues, usually dialyzes on low K bath)  Plan HD now off schedule HD again tomorrow to keep on schedule I am told [facility] is to admit for abd pain eval  Usual Rx: TTS AF 3.75 hours 1K 2.5 Ca bath 400/800 L AVF Fe load ongoing will confirm if dose needed tomorrow Hectorol 7 mcg Heparin 8500 units  HD orders in  Jamal Maes, MD [place] Pager 08/03/2016, 9:36 AM

## 2016-08-03 NOTE — ED Provider Notes (Addendum)
[facility] DEPT Provider Note   CSN: [id number] Arrival date & time: 08/03/16  8413     History   Chief Complaint Chief Complaint  Patient presents with  . Shortness of Breath    HPI Desiree [name] is a 41 y.o. female.  Patient is a 41 year old female with a history of end-stage renal disease on dialysis Tuesday Thursday Saturday, hypertension, cocaine abuse, chronic pain, childhood asthma and seizures presenting today with shortness of breath and abdominal pain. Patient last dialyzed 6 days ago. She states she was unable to go to dialysis due to excessive diarrhea.  4 days prior to arrival patient developed severe diarrhea. She states she was having a watery stool every 30 minutes which finally started to slow down and stopped yesterday. Today she's had abdominal pain and feels the need to have a bowel movement but is unable. She is complaining of this diffuse crampy abdominal pain and has not been passing gas. She also complains of abdominal distention and fullness as well as shortness of breath that started yesterday when she woke up. Exertion and lying flat makes the shortness of breath worse. She's had a mild dry cough with it but nothing has improved her symptoms except for sitting up. It's continuing to get worse and she did not feel that she would be able to wait for dialysis tomorrow. She has not taken any of her medications in the last 2 days. She denies fever or vomiting. No known bad food exposures or sick contacts. She has not been on antibiotics in the last month. No prior abdominal surgeries   The history is provided by the patient.  Shortness of Breath  This is a new problem. The average episode lasts 24 hours. The problem occurs continuously.The problem has been gradually worsening. Associated symptoms include cough, abdominal pain and leg swelling. Pertinent negatives include no fever, no sputum production, no wheezing and no vomiting. It is unknown what precipitated the  problem. She has tried nothing for the symptoms. The treatment provided no relief. She has had prior hospitalizations. Associated medical issues comments: esrd.    Past Medical History:  Diagnosis Date  . Childhood asthma   . Complication of anesthesia    "sometimes it does not work; didn't during [place]" (01/21/2016)  . ESRD (end stage renal disease) on dialysis ([place])    "TTS; [facility]; [facility]" (01/21/2016)  . GERD (gastroesophageal reflux disease)    nexium prn  . Gout   . History of blood transfusion    "related to kidneys; I've had 4" (01/21/2016)  . Hypertension   . Migraine    "qod now" (01/21/2016)  . Preterm labor ~ 2014  . Renal insufficiency   . Seizures ([place])    "last one was in 2000; related to preeclampsia" (01/21/2016)    Patient Active Problem List   Diagnosis Date Noted  . Dyspnea 03/07/2016  . Left arm pain 01/21/2016  . Chronic pain 01/21/2016  . Muscle cramps 03/29/2015  . Symptomatic anemia 09/15/2013  . Anemia of chronic kidney failure 09/15/2013  . Chest pain 09/08/2013  . Hyperkalemia 08/29/2013  . Abdominal pain 08/29/2013  . Nausea & vomiting 08/29/2013  . Cocaine abuse 08/26/2013  . Renal disease in pregnancy, antepartum 08/22/2013  . Rh negative, antepartum 08/22/2013  . Missed abortion 08/22/2013  . Depression 04/21/2012  . Acute blood loss anemia 08/03/2011  . ESRD (end stage renal disease) ([place]) 08/03/2011  . Hypokalemia 08/03/2011    Past Surgical History:  Procedure Laterality Date  . AV FISTULA PLACEMENT Left 2010  . CERVICAL BIOPSY  W/ LOOP ELECTRODE EXCISION  2001  . DILATION AND EVACUATION  08/02/2011   Procedure: DILATATION AND EVACUATION;  Surgeon: Logan Bores, MD;  Location: [facility];  Service: Gynecology;;  . Brigitte Pulse AND EVACUATION N/A 08/31/2013   Procedure: DILATATION AND EVACUATION;  Surgeon: Woodroe Mode, MD;  Location: [facility];  Service: Gynecology;  Laterality: N/A;  . RENAL BIOPSY    . REVISION OF  ARTERIOVENOUS GORETEX GRAFT Left 02/11/2013   Procedure: REVISION OF ARTERIOVENOUS GORTEX FISTULA;  Surgeon: Rosetta Posner, MD;  Location: [facility] OR;  Service: Vascular;  Laterality: Left;    OB History    Gravida Para Term Preterm AB Living   6 1 1  0 4 1   SAB TAB Ectopic Multiple Live Births   2 2 0 0 1       Home Medications    Prior to Admission medications   Medication Sig Start Date End Date Taking? Authorizing Provider  albuterol (PROVENTIL HFA;VENTOLIN HFA) 108 (90 Base) MCG/ACT inhaler Inhale 2 puffs into the lungs every 4 (four) hours as needed for wheezing or shortness of breath. 05/02/16   Forde Dandy, MD  benzonatate (TESSALON) 100 MG capsule Take 2 capsules (200 mg total) by mouth 2 (two) times daily as needed for cough. 03/08/16   Harris, Abigail, PA-C  benzonatate (TESSALON) 100 MG capsule Take 1 capsule (100 mg total) by mouth every 8 (eight) hours. 05/02/16   Forde Dandy, MD  ceFAZolin (ANCEF) 2-4 GM/100ML-% IVPB Inject 100 mLs (2 g total) into the vein Every Tuesday,Thursday,and Saturday with dialysis. 01/23/16   Geradine Girt, DO  cinacalcet (SENSIPAR) 30 MG tablet Take 60 mg by mouth every evening.     [provider]  gabapentin (NEURONTIN) 100 MG capsule Take 300 mg by mouth 2 (two) times daily.     [provider]  Oxycodone HCl 10 MG TABS Take 1 tablet (10 mg total) by mouth daily. 01/23/16   Geradine Girt, DO  sevelamer carbonate (RENVELA) 800 MG tablet Take 2,400 mg by mouth 3 (three) times daily with meals.    [provider]  sodium bicarbonate 325 MG tablet Take 325 mg by mouth 2 (two) times daily.    [provider]    Family History Family History  Problem Relation Age of Onset  . Diabetes Mother   . Hyperlipidemia Mother   . Hypertension Mother   . Heart disease Mother   . Hypertension Unknown   . Diabetes type II Unknown     Social History Social History  Substance Use Topics  . Smoking status: Passive  Smoke Exposure - Never Smoker    Types: Cigarettes  . Smokeless tobacco: Never Used  . Alcohol use No     Allergies   Prednisone and Blain Pais allergy]   Review of Systems Review of Systems  Constitutional: Negative for fever.  Respiratory: Positive for cough and shortness of breath. Negative for sputum production and wheezing.   Cardiovascular: Positive for leg swelling.  Gastrointestinal: Positive for abdominal pain. Negative for vomiting.  All other systems reviewed and are negative.    Physical Exam Updated Vital Signs BP (!) 181/101 (BP Location: Right Arm)   Pulse 74   Temp 99.4 F (37.4 C) (Oral)   Resp (!) 24   SpO2 98%   Physical Exam  Constitutional: She is oriented to person, place, and  time. She appears well-developed and well-nourished. She appears distressed.  Looks uncomfortable and visibly short of breath  HENT:  Head: Normocephalic and atraumatic.  Mouth/Throat: Oropharynx is clear and moist.  Eyes: Conjunctivae and EOM are normal. Pupils are equal, round, and reactive to light.  Neck: Normal range of motion. Neck supple.  Cardiovascular: Normal rate, regular rhythm and intact distal pulses.   No murmur heard. Pulmonary/Chest: Effort normal. Tachypnea noted. No respiratory distress. She has no wheezes. She has rales in the right lower field and the left lower field.  Abdominal: Soft. She exhibits distension. There is tenderness. There is guarding. There is no rebound.  Diffuse abdominal tenderness with mild guarding  Musculoskeletal: Normal range of motion. She exhibits no edema or tenderness.  Neurological: She is alert and oriented to person, place, and time.  Skin: Skin is warm and dry. No rash noted. No erythema.  Psychiatric: She has a normal mood and affect. Her behavior is normal.  Nursing note and vitals reviewed.    ED Treatments / Results  Labs (all labs ordered are listed, but only abnormal results are displayed) Labs Reviewed  CBC  WITH DIFFERENTIAL/PLATELET - Abnormal; Notable for the following:       Result Value   RBC 3.77 (*)    Hemoglobin 10.7 (*)    HCT 33.4 (*)    RDW 17.5 (*)    All other components within normal limits  COMPREHENSIVE METABOLIC PANEL - Abnormal; Notable for the following:    Sodium 134 (*)    Potassium >7.5 (*)    Chloride 96 (*)    CO2 15 (*)    BUN 135 (*)    Creatinine, Ser 26.92 (*)    Calcium 7.6 (*)    Total Bilirubin 1.4 (*)    GFR calc non Af Amer 1 (*)    GFR calc Af Amer 2 (*)    Anion gap 23 (*)    All other components within normal limits  LIPASE, BLOOD - Abnormal; Notable for the following:    Lipase 60 (*)    All other components within normal limits  I-STAT BETA HCG BLOOD, ED ([facility], [facility], [facility] ONLY) - Abnormal; Notable for the following:    I-stat hCG, quantitative 9.0 (*)    All other components within normal limits  I-STAT CHEM 8, ED - Abnormal; Notable for the following:    Sodium 133 (*)    Potassium 7.0 (*)    BUN 136 (*)    Creatinine, Ser >18.00 (*)    Calcium, Ion 0.83 (*)    Hemoglobin 11.6 (*)    HCT 34.0 (*)    All other components within normal limits  I-STAT TROPOININ, ED  CBG MONITORING, ED    EKG  EKG Interpretation  Date/Time:  Monday Aug 03 2016 07:26:44 EDT Ventricular Rate:  71 PR Interval:    QRS Duration: 82 QT Interval:  448 QTC Calculation: 487 R Axis:   136 Text Interpretation:  Sinus rhythm Borderline short PR interval Right axis deviation Abnormal T, consider ischemia, lateral leads peaked T wave Confirmed by Blanchie Dessert [phone]) on 08/03/2016 7:51:11 AM       Radiology Dg Abd Acute W/chest  Result Date: 08/03/2016 CLINICAL DATA:  Abdominal distention, nausea. EXAM: DG ABDOMEN ACUTE W/ 1V CHEST COMPARISON:  Radiographs of May 02, 2016. CT scan of December 20, 2013. FINDINGS: Stable cardiomediastinal silhouette. No pneumothorax is noted. Mild central pulmonary vascular congestion and probable bilateral pulmonary edema is  noted. No pneumoperitoneum is noted. No abnormal bowel gas pattern is noted. Phlebolith is noted in the pelvis. There is the suggestion of her rounded mass in the left upper quadrant. IMPRESSION: No evidence of bowel obstruction or ileus. Mild central pulmonary vascular congestion is noted with possible bilateral pulmonary edema. There is the suggestion of rounded density or mass seen in the left upper quadrant of the abdomen. Further evaluation with ultrasound or CT scan is recommended. Electronically Signed   By: Marijo Conception, M.D.   On: 08/03/2016 09:07    Procedures Procedures (including critical care time)  Medications Ordered in ED Medications  albuterol (PROVENTIL) (2.5 MG/3ML) 0.083% nebulizer solution 5 mg (not administered)  insulin aspart (novoLOG) injection 10 Units (not administered)  dextrose 50 % solution 50 mL (not administered)  sodium bicarbonate injection 50 mEq (not administered)  calcium gluconate 1 g in sodium chloride 0.9 % 100 mL IVPB (not administered)  morphine 4 MG/ML injection 4 mg (4 mg Intravenous Given 08/03/16 0757)  ondansetron (ZOFRAN) injection 4 mg (4 mg Intravenous Given 08/03/16 0757)     Initial Impression / Assessment and Plan / ED Course  I have reviewed the triage vital signs and the nursing notes.  Pertinent labs & imaging results that were available during my care of the patient were reviewed by me and considered in my medical decision making (see chart for details).     Patient is a 41 year old female with end-stage renal disease last dialyzed 6 days ago presenting with shortness of breath and abdominal pain. Shortness of breath most likely related to fluid overload due to lack of dialysis. Abdominal pain may also be contributed to that however she had excessive diarrhea 4 days ago that stopped yesterday with nausea but no vomiting. No known food borne illness or sick contacts. Lower suspicion for C. difficile this patient has not had any  antibiotics. She has diffuse abdominal pain with mild guarding but no rebound. No prior abdominal surgeries and patient does have bowel sounds. Lower suspicion for obstruction but could be colitis versus diverticulitis versus other intra-abdominal pathology. Also concern for hyperkalemia given patient has not dialyzed. EKG with mild T waves. Patient is hypertensive here heart rate and oxygen saturation are within normal limits. She has not had any of her blood pressure medication in the last 2 days. Patient will need dialysis today.   CBC, CMP, lipase, troponin, acute abdominal series pending. Patient given pain and nausea control.  9:00 AM Labs are consistent with hyperkalemia and uremia. Potassium is 7.0. Normal white count. I-STAT hCG with mild elevation of 9 however most likely false lab. Troponin within normal limits. Patient was started on hyperkalemia protocol with albuterol, Calcium gluconate due to peaked t-waves, insulin, D50 with frequent glucose checks and bicarbonate. We'll discuss with nephrology for emergent dialysis. Imaging pending. Hyperkalemia may also be resulting in patient's abdominal cramping.  Lipase is slightly elevated at 60 but she denies any alcohol use and does not have localized left upper quadrant pain.  9:16 AM Pt still having abd pain and now localized to the RLQ.  Will get CT to further eval.  Spoke with Dr. Lorrene Reid who will arrange dialysis.  CRITICAL CARE Performed by: Blanchie Dessert Total critical care time: 30 minutes Critical care time was exclusive of separately billable procedures and treating other patients. Critical care was necessary to treat or prevent imminent or life-threatening deterioration. Critical care was time spent personally by me on the following activities: development of treatment  plan with patient and/or surrogate as well as nursing, discussions with consultants, evaluation of patient's response to treatment, examination of patient, obtaining  history from patient or surrogate, ordering and performing treatments and interventions, ordering and review of laboratory studies, ordering and review of radiographic studies, pulse oximetry and re-evaluation of patient's condition.   Final Clinical Impressions(s) / ED Diagnoses   Final diagnoses:  Hyperkalemia  Uremia  ESRD (end stage renal disease) ([place])  Other hypervolemia    New Prescriptions New Prescriptions   No medications on file     Blanchie Dessert, MD 08/03/16 [place], [place], MD 08/05/16 [phone]

## 2016-08-03 NOTE — ED Notes (Signed)
cbg was 77

## 2016-08-03 NOTE — ED Notes (Signed)
Pt wheeled back to room from waiting area. Pt assisted into gown and onto bed, placed pt on monitor.

## 2016-08-03 NOTE — ED Notes (Signed)
Reports no dialysis since Tuesday due to diarrhea Wednesday-Sunday.

## 2016-08-04 ENCOUNTER — Observation Stay (HOSPITAL_COMMUNITY): Payer: Medicare Other

## 2016-08-04 DIAGNOSIS — N186 End stage renal disease: Secondary | ICD-10-CM | POA: Diagnosis not present

## 2016-08-04 DIAGNOSIS — Z91013 Allergy to seafood: Secondary | ICD-10-CM

## 2016-08-04 DIAGNOSIS — I120 Hypertensive chronic kidney disease with stage 5 chronic kidney disease or end stage renal disease: Secondary | ICD-10-CM | POA: Diagnosis not present

## 2016-08-04 DIAGNOSIS — R197 Diarrhea, unspecified: Secondary | ICD-10-CM | POA: Diagnosis not present

## 2016-08-04 DIAGNOSIS — Z91048 Other nonmedicinal substance allergy status: Secondary | ICD-10-CM

## 2016-08-04 DIAGNOSIS — R109 Unspecified abdominal pain: Secondary | ICD-10-CM

## 2016-08-04 DIAGNOSIS — Z992 Dependence on renal dialysis: Secondary | ICD-10-CM | POA: Diagnosis not present

## 2016-08-04 DIAGNOSIS — Z885 Allergy status to narcotic agent status: Secondary | ICD-10-CM

## 2016-08-04 DIAGNOSIS — Z888 Allergy status to other drugs, medicaments and biological substances status: Secondary | ICD-10-CM

## 2016-08-04 LAB — COMPREHENSIVE METABOLIC PANEL
ALT: 25 U/L (ref 14–54)
AST: 21 U/L (ref 15–41)
Albumin: 3.2 g/dL — ABNORMAL LOW (ref 3.5–5.0)
Alkaline Phosphatase: 59 U/L (ref 38–126)
Anion gap: 16 — ABNORMAL HIGH (ref 5–15)
BUN: 58 mg/dL — ABNORMAL HIGH (ref 6–20)
CALCIUM: 8.2 mg/dL — AB (ref 8.9–10.3)
CO2: 25 mmol/L (ref 22–32)
CREATININE: 17.12 mg/dL — AB (ref 0.44–1.00)
Chloride: 96 mmol/L — ABNORMAL LOW (ref 101–111)
GFR calc non Af Amer: 2 mL/min — ABNORMAL LOW (ref >60)
GFR, EST AFRICAN AMERICAN: 3 mL/min — AB (ref >60)
Glucose, Bld: 79 mg/dL (ref 65–99)
Potassium: 3.7 mmol/L (ref 3.5–5.1)
SODIUM: 137 mmol/L (ref 135–145)
Total Bilirubin: 0.8 mg/dL (ref 0.3–1.2)
Total Protein: 7 g/dL (ref 6.5–8.1)

## 2016-08-04 LAB — HIV ANTIBODY (ROUTINE TESTING W REFLEX): HIV Screen 4th Generation wRfx: NONREACTIVE

## 2016-08-04 MED ORDER — SODIUM CHLORIDE 0.9 % IV SOLN: 100.0000 mL | INTRAVENOUS | Status: DC | PRN

## 2016-08-04 MED ORDER — HEPARIN SODIUM (PORCINE) 1000 UNIT/ML DIALYSIS: 8500.0000 [IU] | INTRAMUSCULAR | Status: DC | PRN

## 2016-08-04 MED ORDER — OXYCODONE HCL 5 MG PO TABS
ORAL_TABLET | ORAL | Status: AC
  Filled 2016-08-04: qty 2

## 2016-08-04 MED ORDER — DIPHENHYDRAMINE HCL 25 MG PO CAPS
25.0000 mg | ORAL_CAPSULE | Freq: Three times a day (TID) | ORAL | Status: DC | PRN
  Administered 2016-08-04 (×2): 25 mg via ORAL
  Filled 2016-08-04 (×2): qty 1

## 2016-08-04 MED ORDER — HEPARIN SODIUM (PORCINE) 1000 UNIT/ML DIALYSIS: 1000.0000 [IU] | INTRAMUSCULAR | Status: DC | PRN

## 2016-08-04 MED ORDER — GABAPENTIN 300 MG PO CAPS
300.0000 mg | ORAL_CAPSULE | Freq: Two times a day (BID) | ORAL | Status: DC
  Filled 2016-08-04: qty 1

## 2016-08-04 MED ORDER — PENTAFLUOROPROP-TETRAFLUOROETH EX AERO: 1.0000 "application " | INHALATION_SPRAY | CUTANEOUS | Status: DC | PRN

## 2016-08-04 MED ORDER — OXYCODONE HCL 10 MG PO TABS: 10.0000 mg | ORAL_TABLET | Freq: Every day | ORAL | 0 refills | Status: DC

## 2016-08-04 MED ORDER — LIDOCAINE HCL (PF) 1 % IJ SOLN: 5.0000 mL | INTRAMUSCULAR | Status: DC | PRN

## 2016-08-04 MED ORDER — SODIUM CHLORIDE 0.9 % IV SOLN
100.0000 mL | INTRAVENOUS | Status: DC | PRN
Start: 2016-08-04 — End: 2016-08-04

## 2016-08-04 MED ORDER — LIDOCAINE-PRILOCAINE 2.5-2.5 % EX CREA: 1.0000 "application " | TOPICAL_CREAM | CUTANEOUS | Status: DC | PRN

## 2016-08-04 MED ORDER — GABAPENTIN 100 MG PO CAPS
300.0000 mg | ORAL_CAPSULE | Freq: Two times a day (BID) | ORAL | 0 refills | Status: DC
Start: 2016-08-04 — End: 2017-12-07

## 2016-08-04 NOTE — Procedures (Signed)
I have personally attended this patient's dialysis session.   HD again today to get back on schedule. Should be fine for discharge after  Jamal Maes, MD [place] Pager 08/04/2016, 4:51 PM

## 2016-08-04 NOTE — Discharge Instructions (Addendum)
You were admitted to the hospital with abdominal pain and found to have dangerously high levels of potassium in your blood from missing dialysis.  The diarrhea that had been making you feel sick seems to have gone away on its own, and the abdominal pain was from missing dialysis.  Please make sure to go to dialysis as scheduled every Tuesday, Thursday, and Saturday, even if you are feeling sick.  Missing dialysis will only make you feel worse.  Please find a primary care doctor and see them regularly. You may call [facility] at [phone] for the names and numbers of local doctors accepting new patients, as you have insurance you will need to arrange your own appointments.

## 2016-08-04 NOTE — Progress Notes (Signed)
Subjective: Desiree [name] was sitting in bed in less discomfort than yesterday. She did not complain of cramping pain in her legs or abdominal pain this morning and denied nausea, vomiting or diarrhea in the last 24 hours. She did complain of pruritis, which began later in the day yesterday was persistent through the time we were on rounds. In addition, she stated that she was given a dose of Morphine yesterday and has had a reaction to this drug in the past.    Objective: Vital signs in last 24 hours: Vitals:   08/03/16 2244 08/04/16 0629 08/04/16 0841 08/04/16 1004  BP: (!) 154/80 (!) 155/81  (!) 151/78  Pulse: 76 74 80 83  Resp: 18 18 18 20   Temp: 99.1 F (37.3 C) 98.2 F (36.8 C)  98.8 F (37.1 C)  TempSrc: Oral Oral  Oral  SpO2: 97% 100% 96% 95%  Weight: 88.9 kg (196 lb)     Height:       Weight change:  -3.3kg last 24 hrs (since dialysis)  Intake/Output Summary (Last 24 hours) at 08/04/16 1124 Last data filed at 08/04/16 1006  Gross per 24 hour  Intake              120 ml  Output             2263 ml  Net            -2143 ml   Physical Exam: Gen: no acute distress Pulm: no increased work of breathing, CAB CV: RRR, nMRG, no peripheral edema Abd: Soft, Non-tender non-distended    Assessment/Plan: Active Problems:   RLQ abdominal pain   ESRD on Dialysis   Hyperkalemia   ESRD on Dialysis, Hyperkalemia, Hypertension: Desiree [name] is a ESRD patient on dialysis. She presented with a 5 day history of diarrhea that resolved the day before her presentation. This episode of diarrhea caused her to miss 2 consecutive dialysis sessions. On presentation she was hyperkalemic with elevated blood pressures, and an Anion Gap Metabolic Acidosis. The metabolic abnormalities and elevation in blood pressure can all be reasonably be attributed to the ESRD patient missing two dialysis sessions. On presentation, her weight was elevated from baseline at 92.2kg. After an emergent dialysis session on  the day of presentation and her regularly scheduled session today (t,th,Sat schedule) her weight was decreased to 88.9kg, her hyperkalemia and AG MA had resolved and her blood pressures were decreased from the 160s-180s to the 140s-150s. Based on the resolution of these abnormalities after dialysis, it can be safely assumed that they were precipitated by the patient missing 2 dialysis session.  RLQ Abdominal Pain/ Cramping leg pain:  Desiree [name] was also complaining of pain in her RUQ and cramping pain in her legs yesterday. Ultrasound of the RUQ was not suggestive of acute cholecystitis, and the pancreas that could be visualized was recorded as normal. Furthermore, the abdominal pain significantly improved after Desiree [name]'s first round of dialysis, and the cramping leg pain completely resolved. We will continue to monitor Desiree [name]'s levels of pain after her second dialysis session. If her pain continues to approve and she is able to tolerate oral intake without return of nausea, vomiting or diarrhea, then we can safely assume that the pain can be attributed to missed dialysis sessions after a diarrheal illness and the patient should be suitable for discharge this evening. --ESRD on HD (T,Th,Sat) continue   Dispo: anticipate today   This is a Careers information officer Note.  The  care of the patient was discussed with Dr. Inda Castle and the assessment and plan formulated with their assistance.  Please see their attached note for official documentation of the daily encounter.   Cyndie Mull, Medical Student 08/04/2016, 11:24 AM

## 2016-08-04 NOTE — Progress Notes (Signed)
   Subjective: Feels much better today, no more abdominal pain, hungry and wants to eat.  Had painful cramping in her legs during HD today.  Objective:  Vital signs in last 24 hours: Vitals:   08/03/16 2244 08/04/16 0629 08/04/16 0841 08/04/16 1004  BP: (!) 154/80 (!) 155/81  (!) 151/78  Pulse: 76 74 80 83  Resp: 18 18 18 20   Temp: 99.1 F (37.3 C) 98.2 F (36.8 C)  98.8 F (37.1 C)  TempSrc: Oral Oral  Oral  SpO2: 97% 100% 96% 95%  Weight: 196 lb (88.9 kg)     Height:       Physical Exam  Constitutional: She is oriented to person, place, and time. She appears well-developed and well-nourished. No distress.  Cardiovascular: Normal rate and regular rhythm.   Pulmonary/Chest: Breath sounds normal.  Abdominal: Soft. She exhibits no distension. There is no tenderness.  Musculoskeletal: She exhibits no edema.  Neurological: She is alert and oriented to person, place, and time.  Psychiatric: She has a normal mood and affect. Her behavior is normal.   CMP Latest Ref Rng & Units 08/04/2016 08/03/2016 08/03/2016  Glucose 65 - 99 mg/dL 79 72 84  BUN 6 - 20 mg/dL 58(H) 136(H) 135(H)  Creatinine 0.44 - 1.00 mg/dL 17.12(H) >18.00(H) 26.92(H)  Sodium 135 - 145 mmol/L 137 133(L) 134(L)  Potassium 3.5 - 5.1 mmol/L 3.7 7.0(HH) >7.5(HH)  Chloride 101 - 111 mmol/L 96(L) 102 96(L)  CO2 22 - 32 mmol/L 25 - 15(L)  Calcium 8.9 - 10.3 mg/dL 8.2(L) - 7.6(L)  Total Protein 6.5 - 8.1 g/dL 7.0 - 7.2  Total Bilirubin 0.3 - 1.2 mg/dL 0.8 - 1.4(H)  Alkaline Phos 38 - 126 U/L 59 - 80  AST 15 - 41 U/L 21 - 39  ALT 14 - 54 U/L 25 - 27     Assessment/Plan:  Principal Problem:   ESRD (end stage renal disease) ([facility]) Active Problems:   Abdominal pain   41 y.o. female with ESRD admitted with abdominal pain, hyperkalemia, and uremia secondary to dialysis noncompliance.  She reported a diarrheal illness the preceding week which has resolved, and her symptoms improved with HD.  #ESRD Emergent HD for  hyperkalemia yesterday, plan to receive regularly scheduled HD today to stay on [facility] schedule. -Appreciate nephrology management  #Abdominal Pain Resolved.  No further diarrhea, RUQ tenderness resolved, likely secondary to uremia and hyperkalemia.  Fluids: none Diet: renal DVT Prophylaxis: heparin Code Status: full  Dispo: Anticipated discharge today after HD.  Minus Liberty, MD 08/04/2016, 12:48 PM Pager: [phone]

## 2016-08-04 NOTE — Progress Notes (Signed)
[facility] Progress Note   Subjective: Abdominal pain improved on CL diet Denies N/V/D. Cramped and s/o early HD yesterday   Objective Vitals:   08/03/16 2002 08/03/16 2244 08/04/16 0629 08/04/16 0841  BP:  (!) 154/80 (!) 155/81   Pulse:  76 74 80  Resp:  18 18 18   Temp:  99.1 F (37.3 C) 98.2 F (36.8 C)   TempSrc:  Oral Oral   SpO2: 100% 97% 100% 96%  Weight:  88.9 kg (196 lb)    Height:       Physical Exam General: WNWD female NAD Heart: RRR Lungs: CTAB Abdomen: soft NT/ND Extremities: no LE edema  Dialysis Access: LUE AVF +thrill/bruit    Recent Labs Lab 08/03/16 0739 08/03/16 0851 08/04/16 0742  NA 134* 133* 137  K >7.5* 7.0* 3.7  CL 96* 102 96*  CO2 15*  --  25  GLUCOSE 84 72 79  BUN 135* 136* 58*  CREATININE 26.92* >18.00* 17.12*  CALCIUM 7.6*  --  8.2*   s:  Recent Labs Lab 08/03/16 0739 08/04/16 0742  AST 39 21  ALT 27 25  ALKPHOS 80 59  BILITOT 1.4* 0.8  PROT 7.2 7.0  ALBUMIN 3.6 3.2*    Recent Labs Lab 08/03/16 0739  LIPASE 60*     Recent Labs Lab 08/03/16 0739 08/03/16 0851  WBC 7.5  --   NEUTROABS 4.9  --   HGB 10.7* 11.6*  HCT 33.4* 34.0*  MCV 88.6  --   PLT 180  --    Blood Culture    Component Value Date/Time   SDES BLOOD RIGHT HAND 01/23/2016 0830   SPECREQUEST IN PEDIATRIC BOTTLE 3CC 01/23/2016 0830   CULT NO GROWTH 5 DAYS 01/23/2016 0830   REPTSTATUS 01/28/2016 FINAL 01/23/2016 0830     Recent Labs Lab 08/03/16 0856  GLUCAP 77   Iron Studies: No results for input(s): IRON, TIBC, TRANSFERRIN, FERRITIN in the last 72 hours. Lab Results  Component Value Date   INR 1.09 03/29/2015   INR 1.05 09/15/2013   Medications:  . calcium acetate  1,334 mg Oral TID WC  . cinacalcet  60 mg Oral QPM  . heparin  5,000 Units Subcutaneous Q8H  . mometasone-formoterol  2 puff Inhalation BID  . sevelamer carbonate  2,400 mg Oral TID WC    Dialysis Orders:  [place] [facility] 3:22min 180F BFR 400/800  1K/2.5Ca EDW 86kg  -Heparin 8500 U IV bolus  1000 U IV midtreatment  -Hectorol 7 mcg IV q HD -Venofer 100mg  IV q HD x 10 (until 6/7) OP Labs - Hgb 11.7 Tsat 16%  Ca 8.4 P 8.8 PTH 459   Assessment/Plan:  1. Hyperkalemia secondary to poor compliance with dialysis - Admitted with K 7.0 after missing HD for one week -- Resolved with emergent HD K 3.7 - Use 4K bath with HD today  2. RUQ abdominal pain - Improved today Ab [place] with no acute findings - per primary  3.  ESRD -  TTS - for HD on schedule today  4.  Hypertension/volume  - BP improved no OP meds/ HD 5/21 net UF 2.2L post HD wt 89.6 / HD today UF to EDW  5.  Anemia  - Hgb 11.6 Not on ESA / Cont Fe load  6.  Metabolic bone disease -  Poor compliance with binders as outpatient - Cont VDRA/Sensipar/Ca acetate and sevelamer binder   7.  Nutrition - Renal diet/vitamins    Lynnda Child  PA-C [facility] Pager [phone] 08/04/2016,9:41 AM  LOS: 0 days   I have seen and examined this patient and agree with plan and assessment in the above note with renal recommendations/intervention highlighted. Needs HD again today to get back on schedule. Long d/w patient regarding compliance and need top run full treatment times.   Senta Kantor B,MD 08/04/2016 2:27 PM

## 2016-08-04 NOTE — Care Management Obs Status (Signed)
[facility] NOTIFICATION   Patient Details  Name: DALYAH PLA MRN: [id number] Date of Birth: 1975-05-10   Medicare Observation Status Notification Given:  Yes    Franciso Dierks, Rory Percy, RN 08/04/2016, 2:22 PM

## 2016-08-04 NOTE — Progress Notes (Signed)
Pt. discharged to home. Pt after visit summary reviewed and pt capable of re verbalizing medications and follow up appointments. Page to [facility] medicine for pt prescriptions and refills. Pt remains stable. No signs and symptoms of distress. Educated to return to ER in the event of SOB, dizziness, chest pain, or fainting. Mady Gemma, RN

## 2016-08-04 NOTE — Discharge Summary (Signed)
Name: Desiree [name] MRN: [id number] DOB: 1976-01-28 41 y.o. PCP: Fleet Contras, MD  Date of Admission: 08/03/2016  7:20 AM Date of Discharge: 08/04/2016 Attending Physician: Aldine Contes, MD  Discharge Diagnosis:  Principal Problem:   ESRD (end stage renal disease) ([place]) Active Problems:   Abdominal pain   Discharge Medications: Allergies as of 08/04/2016      Reactions   Morphine Shortness Of Breath, Anaphylaxis   Prednisone Other (See Comments)   Other reaction(s): Other (See Comments) Muscle spasms Patient says prednisone causes her to cramp all over, muscle spasms uncontrolled   [place]una [fish Allergy] Itching, Swelling, Rash, Other (See Comments)   Face droops also   Tape Itching      Medication List    STOP taking these medications   benzonatate 100 MG capsule Commonly known as:  TESSALON   ceFAZolin 2-4 GM/100ML-% IVPB Commonly known as:  ANCEF     TAKE these medications   albuterol 108 (90 Base) MCG/ACT inhaler Commonly known as:  PROVENTIL HFA;VENTOLIN HFA Inhale 2 puffs into the lungs every 4 (four) hours as needed for wheezing or shortness of breath.   cinacalcet 30 MG tablet Commonly known as:  SENSIPAR Take 60 mg by mouth every evening.   fluticasone-salmeterol 45-21 MCG/ACT inhaler Commonly known as:  ADVAIR HFA Inhale 2 puffs into the lungs 2 (two) times daily.   gabapentin 100 MG capsule Commonly known as:  NEURONTIN Take 300 mg by mouth 2 (two) times daily.   Oxycodone HCl 10 MG Tabs Take 1 tablet (10 mg total) by mouth daily. What changed:  when to take this  reasons to take this   sevelamer carbonate 800 MG tablet Commonly known as:  RENVELA Take 2,400 mg by mouth 3 (three) times daily with meals.   sodium bicarbonate 325 MG tablet Take 325 mg by mouth 2 (two) times daily.       Disposition and follow-up:   Desiree [name] was discharged from [facility] in Stable condition.  At the hospital  follow up visit please address:  1.  ESRD.  Reinforce importance of HD adherence.  2.  Diarrhea and Abdominal Pain.  Ask about further symptoms.  3.  Labs / imaging needed at time of follow-up: none  4.  Pending labs/ test needing follow-up: none  Follow-up Appointments: Follow-up Information    [place]. Schedule an appointment as soon as possible for a visit in 2 week(s).   Contact information: [place] [phone] [place] [facility] Course by problem list: Principal Problem:   ESRD (end stage renal disease) ([place]) Active Problems:   Abdominal pain   1. ESRD Desiree [name] is a 41 year old woman with ESRD on Tuesday Thursday Saturday hemodialysis schedule who presented after missing her last 2 sessions of HD due to feeling sick with a diarrheal illness. She was hyperkalemic with potassium of above 7 at presentation and concern for peaked T waves on her EKG. She received emergent hemodialysis on the day of admission, and then routinely scheduled HD on Tuesday before discharge.  The importance of adhering to her dialysis schedule even when feeling sick was reinforced  2. Abdominal Pain She presented with 1-2 days of right-sided abdominal pain which was not closely associated with eating, drinking or vomiting.  Her lipase was mildly elevated, LFTs were normal, she did not have fever or leukocytosis. With her right  upper quadrant tenderness there was concern for cholelithiasis, but ultrasound was negative for cholecystic, pancreatic, or hepatic pathology. Her symptoms resolved with hemodialysis, and her presentation overall is consistent with symptoms of untreated uremia and hyperkalemia.  3. Diarrhea Reported a week of diarrhea which resolved prior to admission. No further bowel movements during admission.  Discharge Vitals:   BP (!) 151/78 (BP Location: Right Arm)   Pulse 83   Temp 98.8 F (37.1 C)  (Oral)   Resp 20   Ht 5\' 5"  (1.651 m)   Wt 196 lb (88.9 kg)   LMP 07/16/2016 Comment: Doctor instructs to proceeed - false positive.  TRK  SpO2 95%   BMI 32.62 kg/m   Pertinent Labs, Studies, and Procedures:   BMP Latest Ref Rng & Units 08/04/2016 08/03/2016 08/03/2016  Glucose 65 - 99 mg/dL 79 72 84  BUN 6 - 20 mg/dL 58(H) 136(H) 135(H)  Creatinine 0.44 - 1.00 mg/dL 17.12(H) >18.00(H) 26.92(H)  Sodium 135 - 145 mmol/L 137 133(L) 134(L)  Potassium 3.5 - 5.1 mmol/L 3.7 7.0(HH) >7.5(HH)  Chloride 101 - 111 mmol/L 96(L) 102 96(L)  CO2 22 - 32 mmol/L 25 - 15(L)  Calcium 8.9 - 10.3 mg/dL 8.2(L) - 7.6(L)   CBC Latest Ref Rng & Units 08/03/2016 08/03/2016 06/07/2016  WBC 4.0 - 10.5 K/uL - 7.5 5.9  Hemoglobin 12.0 - 15.0 g/dL 11.6(L) 10.7(L) 11.6(L)  Hematocrit 36.0 - 46.0 % 34.0(L) 33.4(L) 36.3  Platelets 150 - 400 K/uL - 180 247   Abdominal Ultrasound 08/04/2016 IMPRESSION: No acute process demonstrated. No evidence of cholelithiasis or cholecystitis. Changes of medical renal disease on the right. Large complex cyst on the left kidney with thinning of renal parenchyma, similar to previous CT.  Discharge Instructions: Discharge Instructions    Diet - low sodium heart healthy    Complete by:  As directed    Increase activity slowly    Complete by:  As directed      You were admitted to the hospital with abdominal pain and found to have dangerously high levels of potassium in your blood from missing dialysis.  The diarrhea that had been making you feel sick seems to have gone away on its own, and the abdominal pain was from missing dialysis.  Please make sure to go to dialysis as scheduled every Tuesday, Thursday, and Saturday, even if you are feeling sick.  Missing dialysis will only make you feel worse.  Please find a primary care doctor and see them regularly.  Signed: Minus Liberty, MD 08/04/2016, 1:07 PM   Pager: [phone]

## 2016-08-04 NOTE — Progress Notes (Addendum)
  Date: 08/04/2016  Patient name: Desiree [name]  Medical record number: [id number]  Date of birth: [birth year]   I have seen and evaluated [name] and discussed their care with the Residency Team. In brief, patient is a 41-year-old female with past medical history of end-stage renal disease on hemodialysis, hypertension, seizures who presented to the ED with worsening abdominal pain over the last 2 days.  Patient states that approximately 5 days prior to admission she developed sudden onset of diarrhea-multiple episodes, watery stools, no hematochezia, no melena. Patient missed her last 2 dialysis sessions secondary to her profuse diarrhea and her last hemodialysis session was 6 days prior to admission. Patient states that her diarrhea stopped the day prior to admission but then she developed sudden onset of abdominal pain which is 8 out of 10 in severity, described as an ache, non radiating and diffuse but worse on the right side. This is associated with nausea and vomiting which was nonbloody and nonbilious and poor appetite. Patient also reports worsening shortness of breath/dyspnea on exertion over the last 2-3 days associated with a nonproductive cough. She also complains of sharp pain in both her lower extremities. No chest pain, no lightheadedness, no syncope, no focal weakness, no headache, no sick contacts, no recent URI, no palpitations, no diaphoresis, no fevers or chills.  Today patient states that she feels well but did complain of pruritus overnight which is now improved.  PMHx, Fam Hx, and/or Soc Hx :  As per resident admit note  Vitals:   08/04/16 0841 08/04/16 1004  BP:  (!) 151/78  Pulse: 80 83  Resp: 18 20  Temp:  98.8 F (37.1 C)   Gen.: Awake alert and oriented 3, NAD  CVS: Regular rate and rhythm, normal heart sounds lungs: CTA bilaterally Abdomen: Soft, nontender, nondistended, normoactive bowel sounds Extremities: No edema noted  Assessment and Plan: I have  seen and evaluated the patient as outlined above. I agree with the formulated Assessment and Plan as detailed in the residents' note, with the following changes:   1.  Hyperkalemia/ESRD on hemodialysis: - The patient presented to the ED with worsening abdominal pain and lower extremity pain in the setting of having missed her last 2 hemodialysis sessions and was found to have an elevated potassium of 7 with some peaked T waves on her EKG - Patient was taken for hemodialysis yesterday and her potassium is now normalized and her abdominal pain and lower extremity pain have resolved. Her pain is likely secondary to her hyperkalemia causing muscle cramps. - Patient scheduled for another session of hemodialysis today after which she should be stable for discharge home - Other workup for abdominal pain including abdominal ultrasound and LFTs were unrevealing. No further workup for now   Aldine Contes, MD 5/22/20181:41 PM

## 2016-08-05 LAB — MRSA CULTURE: Culture: NOT DETECTED

## 2016-08-13 ENCOUNTER — Encounter (HOSPITAL_COMMUNITY): Payer: Self-pay

## 2016-08-13 ENCOUNTER — Emergency Department (HOSPITAL_COMMUNITY)
Admission: EM | Admit: 2016-08-13 | Discharge: 2016-08-14 | Disposition: A | Discharge status: Home / Self Care | Payer: Medicare Other | Attending: Emergency Medicine | Admitting: Emergency Medicine

## 2016-08-13 DIAGNOSIS — I120 Hypertensive chronic kidney disease with stage 5 chronic kidney disease or end stage renal disease: Secondary | ICD-10-CM | POA: Diagnosis not present

## 2016-08-13 DIAGNOSIS — Y92410 Unspecified street and highway as the place of occurrence of the external cause: Secondary | ICD-10-CM | POA: Diagnosis not present

## 2016-08-13 DIAGNOSIS — N186 End stage renal disease: Secondary | ICD-10-CM | POA: Diagnosis not present

## 2016-08-13 DIAGNOSIS — S0083XA Contusion of other part of head, initial encounter: Secondary | ICD-10-CM | POA: Diagnosis not present

## 2016-08-13 DIAGNOSIS — Z79899 Other long term (current) drug therapy: Secondary | ICD-10-CM | POA: Insufficient documentation

## 2016-08-13 DIAGNOSIS — Z7722 Contact with and (suspected) exposure to environmental tobacco smoke (acute) (chronic): Secondary | ICD-10-CM | POA: Diagnosis not present

## 2016-08-13 DIAGNOSIS — Y999 Unspecified external cause status: Secondary | ICD-10-CM | POA: Diagnosis not present

## 2016-08-13 DIAGNOSIS — S0990XA Unspecified injury of head, initial encounter: Secondary | ICD-10-CM | POA: Diagnosis present

## 2016-08-13 DIAGNOSIS — Y9389 Activity, other specified: Secondary | ICD-10-CM | POA: Diagnosis not present

## 2016-08-13 DIAGNOSIS — Z7951 Long term (current) use of inhaled steroids: Secondary | ICD-10-CM | POA: Insufficient documentation

## 2016-08-13 LAB — BASIC METABOLIC PANEL
Anion gap: 13 (ref 5–15)
BUN: 33 mg/dL — ABNORMAL HIGH (ref 6–20)
CO2: 26 mmol/L (ref 22–32)
Calcium: 9.7 mg/dL (ref 8.9–10.3)
Chloride: 96 mmol/L — ABNORMAL LOW (ref 101–111)
Creatinine, Ser: 12.08 mg/dL — ABNORMAL HIGH (ref 0.44–1.00)
GFR calc Af Amer: 4 mL/min — ABNORMAL LOW (ref >60)
GFR calc non Af Amer: 3 mL/min — ABNORMAL LOW (ref >60)
Glucose, Bld: 89 mg/dL (ref 65–99)
Potassium: 3.9 mmol/L (ref 3.5–5.1)
Sodium: 135 mmol/L (ref 135–145)

## 2016-08-13 LAB — CBC
HCT: 36.2 % (ref 36.0–46.0)
Hemoglobin: 11.4 g/dL — ABNORMAL LOW (ref 12.0–15.0)
MCH: 28.1 pg (ref 26.0–34.0)
MCHC: 31.5 g/dL (ref 30.0–36.0)
MCV: 89.4 fL (ref 78.0–100.0)
Platelets: 228 10*3/uL (ref 150–400)
RBC: 4.05 MIL/uL (ref 3.87–5.11)
RDW: 16.2 % — ABNORMAL HIGH (ref 11.5–15.5)
WBC: 6.2 10*3/uL (ref 4.0–10.5)

## 2016-08-13 MED ORDER — OXYCODONE-ACETAMINOPHEN 5-325 MG PO TABS
2.0000 | ORAL_TABLET | Freq: Once | ORAL | Status: AC
  Administered 2016-08-14: 2 via ORAL
  Filled 2016-08-13: qty 2
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
## 2016-08-14 DIAGNOSIS — S0083XA Contusion of other part of head, initial encounter: Secondary | ICD-10-CM | POA: Diagnosis not present

## 2016-08-14 NOTE — ED Notes (Signed)
Pt verbalized understanding discharge instructions and denies any further needs or questions at this time. VS stable, ambulatory and steady gait.   

## 2016-09-22 ENCOUNTER — Emergency Department (HOSPITAL_COMMUNITY)
Admission: EM | Admit: 2016-09-22 | Discharge: 2016-09-23 | Disposition: A | Discharge status: Home / Self Care | Payer: Medicare Other | Attending: Emergency Medicine | Admitting: Emergency Medicine

## 2016-09-22 ENCOUNTER — Encounter (HOSPITAL_COMMUNITY): Payer: Self-pay | Admitting: Emergency Medicine

## 2016-09-22 DIAGNOSIS — N186 End stage renal disease: Secondary | ICD-10-CM | POA: Insufficient documentation

## 2016-09-22 DIAGNOSIS — Z7722 Contact with and (suspected) exposure to environmental tobacco smoke (acute) (chronic): Secondary | ICD-10-CM | POA: Diagnosis not present

## 2016-09-22 DIAGNOSIS — Z79899 Other long term (current) drug therapy: Secondary | ICD-10-CM | POA: Diagnosis not present

## 2016-09-22 DIAGNOSIS — I120 Hypertensive chronic kidney disease with stage 5 chronic kidney disease or end stage renal disease: Secondary | ICD-10-CM | POA: Diagnosis not present

## 2016-09-22 DIAGNOSIS — R21 Rash and other nonspecific skin eruption: Secondary | ICD-10-CM | POA: Diagnosis not present

## 2016-09-22 DIAGNOSIS — J45909 Unspecified asthma, uncomplicated: Secondary | ICD-10-CM | POA: Diagnosis not present

## 2016-09-22 NOTE — ED Notes (Signed)
Pt from home with c/o rash on arms hands, legs, and torso x 2 days. Pt denies any new foods or medicines. Pt states rash itches and burns.

## 2016-09-23 DIAGNOSIS — R21 Rash and other nonspecific skin eruption: Secondary | ICD-10-CM | POA: Diagnosis not present

## 2016-09-23 MED ORDER — HYDROXYZINE HCL 25 MG PO TABS: 25.0000 mg | ORAL_TABLET | Freq: Four times a day (QID) | ORAL | 0 refills | Status: DC

## 2016-09-23 MED ORDER — TRIAMCINOLONE ACETONIDE 0.1 % EX CREA: 1.0000 "application " | TOPICAL_CREAM | Freq: Two times a day (BID) | CUTANEOUS | 0 refills | Status: DC

## 2016-09-23 MED ORDER — DEXAMETHASONE SODIUM PHOSPHATE 10 MG/ML IJ SOLN
10.0000 mg | Freq: Once | INTRAMUSCULAR | Status: AC
  Administered 2016-09-23: 10 mg via INTRAMUSCULAR
  Filled 2016-09-23: qty 1

## 2016-09-23 NOTE — ED Provider Notes (Signed)
[place] DEPT Provider Note   CSN: [id number] Arrival date & time: 09/22/16  2311     History   Chief Complaint Chief Complaint  Patient presents with  . Rash    HPI Desiree [name] is a 41 y.o. female.  HPI Desiree [name] is a 41 y.o. female presents to emergency department complaining of rash. Patient states she has read raised spots that are itchy and tender. Noticed a rash 2 days ago. She reports there is a spot to her right hand, right upper arm, right shoulder blade, lower back, one to the abdomen, one to the left side. She states that she thinks they may be spider bites. She has cleaned the entire house today and sprayed all the furniture and her mattress. She states she brought a "house bomb" for insects. She states no one in her household has similar rash. She states she took Benadryl and applied Benadryl cream with no relief. She denies any fever or chills. No headache. No new medications or foods. Last dialysis today.  Past Medical History:  Diagnosis Date  . Childhood asthma   . Complication of anesthesia    "sometimes it does not work; didn't during [place]" (01/21/2016)  . ESRD (end stage renal disease) on dialysis ([place])    "TTS; [facility]; [facility]" (08/03/2016)  . GERD (gastroesophageal reflux disease)    nexium prn  . Gout   . History of blood transfusion    "related to kidneys; I've had 4" (01/21/2016)  . Hypertension   . Migraine    "qod now" (01/21/2016)  . Preterm labor ~ 2014  . Renal insufficiency   . Seizures ([place])    "last one was in 2000; related to preeclampsia" (01/21/2016)    Patient Active Problem List   Diagnosis Date Noted  . Abdominal pain 08/03/2016  . Chronic pain 01/21/2016  . Muscle cramps 03/29/2015  . Anemia of chronic kidney failure 09/15/2013  . Cocaine abuse 08/26/2013  . Depression 04/21/2012  . ESRD (end stage renal disease) ([place]) 08/03/2011    Past Surgical History:  Procedure Laterality Date  . AV FISTULA  PLACEMENT Left 2010  . CERVICAL BIOPSY  W/ LOOP ELECTRODE EXCISION  2001  . DILATION AND EVACUATION  08/02/2011   Procedure: DILATATION AND EVACUATION;  Surgeon: Logan Bores, MD;  Location: [facility];  Service: Gynecology;;  . Brigitte Pulse AND EVACUATION N/A 08/31/2013   Procedure: DILATATION AND EVACUATION;  Surgeon: Woodroe Mode, MD;  Location: [facility];  Service: Gynecology;  Laterality: N/A;  . RENAL BIOPSY    . REVISION OF ARTERIOVENOUS GORETEX GRAFT Left 02/11/2013   Procedure: REVISION OF ARTERIOVENOUS GORTEX FISTULA;  Surgeon: Rosetta Posner, MD;  Location: [facility];  Service: Vascular;  Laterality: Left;    OB History    Gravida Para Term Preterm AB Living   6 1 1  0 4 1   SAB TAB Ectopic Multiple Live Births   2 2 0 0 1       Home Medications    Prior to Admission medications   Medication Sig Start Date End Date Taking? Authorizing Provider  albuterol (PROVENTIL HFA;VENTOLIN HFA) 108 (90 Base) MCG/ACT inhaler Inhale 2 puffs into the lungs every 4 (four) hours as needed for wheezing or shortness of breath. 05/02/16   Forde Dandy, MD  cinacalcet (SENSIPAR) 30 MG tablet Take 60 mg by mouth every evening.     [provider]  fluticasone-salmeterol (ADVAIR HFA) [phone] MCG/ACT inhaler Inhale  2 puffs into the lungs 2 (two) times daily.    [provider]  gabapentin (NEURONTIN) 100 MG capsule Take 3 capsules (300 mg total) by mouth 2 (two) times daily. 08/04/16   Asencion Partridge, MD  hydrOXYzine (ATARAX/VISTARIL) 25 MG tablet Take 1 tablet (25 mg total) by mouth every 6 (six) hours. 09/23/16   Hovanes Hymas, PA-C  Oxycodone HCl 10 MG TABS Take 1 tablet (10 mg total) by mouth daily. 08/04/16   Asencion Partridge, MD  sevelamer carbonate (RENVELA) 800 MG tablet Take 2,400 mg by mouth 3 (three) times daily with meals.    [provider]  sodium bicarbonate 325 MG tablet Take 325 mg by mouth 2 (two) times daily.    [provider]  triamcinolone cream (KENALOG)  0.1 % Apply 1 application topically 2 (two) times daily. 09/23/16   Jeannett Senior, PA-C    Family History Family History  Problem Relation Age of Onset  . Diabetes Mother   . Hyperlipidemia Mother   . Hypertension Mother   . Heart disease Mother   . Hypertension Unknown   . Diabetes type II Unknown     Social History Social History  Substance Use Topics  . Smoking status: Passive Smoke Exposure - Never Smoker    Types: Cigarettes  . Smokeless tobacco: Never Used  . Alcohol use No     Allergies   Morphine; Prednisone; [place] [fish allergy]; and Tape   Review of Systems Review of Systems  Constitutional: Negative for chills and fever.  Respiratory: Negative for cough, chest tightness and shortness of breath.   Cardiovascular: Negative for chest pain, palpitations and leg swelling.  Gastrointestinal: Negative for abdominal pain, diarrhea, nausea and vomiting.  Musculoskeletal: Negative for arthralgias, myalgias, neck pain and neck stiffness.  Skin: Positive for rash.  Neurological: Negative for dizziness, weakness and headaches.  All other systems reviewed and are negative.    Physical Exam Updated Vital Signs BP 140/90 (BP Location: Right Arm)   Pulse 89   Temp 98.9 F (37.2 C) (Oral)   Resp 18   Ht 5\' 5"  (1.651 m)   Wt 86.2 kg (190 lb)   LMP 09/16/2016   SpO2 96%   BMI 31.62 kg/m   Physical Exam  Constitutional: She is oriented to person, place, and time. She appears well-developed and well-nourished. No distress.  HENT:  Head: Normocephalic.  Eyes: Conjunctivae are normal.  Neck: Neck supple.  Cardiovascular: Normal rate, regular rhythm and normal heart sounds.   Pulmonary/Chest: Effort normal and breath sounds normal. No respiratory distress. She has no wheezes. She has no rales.  Musculoskeletal: She exhibits no edema.  Neurological: She is alert and oriented to person, place, and time.  Skin: Skin is warm and dry.  Several erythematous papules  to the body, one to the dorsal right hand, one to the right upper arm, one to the upper right back, one to the left lower back, one to the abdomen, one over the thigh. She is constantly scratching. No drainage, no vesicular lesions.  Psychiatric: She has a normal mood and affect. Her behavior is normal.  Nursing note and vitals reviewed.    ED Treatments / Results  Labs (all labs ordered are listed, but only abnormal results are displayed) Labs Reviewed - No data to display  EKG  EKG Interpretation None       Radiology No results found.  Procedures Procedures (including critical care time)  Medications Ordered in ED Medications  dexamethasone (DECADRON)  injection 10 mg (10 mg Intramuscular Given 09/23/16 0012)     Initial Impression / Assessment and Plan / ED Course  I have reviewed the triage vital signs and the nursing notes.  Pertinent labs & imaging results that were available during my care of the patient were reviewed by me and considered in my medical decision making (see chart for details).     Patient with erythematous rash to the body, that is itchy and tender. Most consistent with insect bites with localized reaction. States Benadryl is not helping. Will give a Decadron IM in emergency department, home with Vistaril, Kenalog cream. She has no rashes or lesions over the oral mucosa. No swelling of her lips, tongue, oropharynx. No respiratory symptoms. No headache. No fever. Discussed return precautions, otherwise follow-up with family doctor in 3-5 days if not improving.''  Vitals:   09/22/16 2319 09/22/16 2330  BP: 140/90   Pulse: 89   Resp: 18   Temp: 98.9 F (37.2 C)   TempSrc: Oral   SpO2: 96%   Weight:  86.2 kg (190 lb)  Height:  5\' 5"  (1.651 m)     Final Clinical Impressions(s) / ED Diagnoses   Final diagnoses:  Rash    New Prescriptions New Prescriptions   HYDROXYZINE (ATARAX/VISTARIL) 25 MG TABLET    Take 1 tablet (25 mg total) by mouth  every 6 (six) hours.   TRIAMCINOLONE CREAM (KENALOG) 0.1 %    Apply 1 application topically 2 (two) times daily.     Jeannett Senior, PA-C 09/23/16 0105    Julianne Rice, MD 09/23/16 1530

## 2016-09-23 NOTE — Discharge Instructions (Signed)
Take Vistaril as prescribed for itching. Apply Kenalog cream topically to the bites. Follow-up with family doctor in 2-3 days if not improving.

## 2016-10-16 ENCOUNTER — Ambulatory Visit: Payer: Medicare Other

## 2016-10-16 ENCOUNTER — Ambulatory Visit (INDEPENDENT_AMBULATORY_CARE_PROVIDER_SITE_OTHER): Payer: Medicare Other

## 2016-10-16 ENCOUNTER — Ambulatory Visit (INDEPENDENT_AMBULATORY_CARE_PROVIDER_SITE_OTHER): Payer: Medicare Other | Admitting: Podiatry

## 2016-10-16 DIAGNOSIS — M21619 Bunion of unspecified foot: Secondary | ICD-10-CM | POA: Diagnosis not present

## 2016-10-16 DIAGNOSIS — M79671 Pain in right foot: Secondary | ICD-10-CM

## 2016-10-16 DIAGNOSIS — M722 Plantar fascial fibromatosis: Secondary | ICD-10-CM | POA: Diagnosis not present

## 2016-10-16 DIAGNOSIS — L853 Xerosis cutis: Secondary | ICD-10-CM

## 2016-10-16 DIAGNOSIS — M79672 Pain in left foot: Secondary | ICD-10-CM

## 2016-10-16 MED ORDER — UREA 40 % EX CREA: 1.0000 "application " | TOPICAL_CREAM | Freq: Every day | CUTANEOUS | 2 refills | Status: DC

## 2016-10-19 NOTE — Progress Notes (Signed)
Subjective:    Patient ID: Desiree [name], female   DOB: 41 y.o.   MRN: [id number]   HPI 41 year old female presents the office today for concerns of dry, cracking skin to her heels. Denies any open sores or drainage. She denies any recent injury or trauma. Denies any swelling or redness to her feet. She is on dialysis 3 times a week. Chest states that she gets pain at times to her heels and general and she points along the bunion on her left foot. Denies any recent injury or trauma. She has no other concerns. No recent treatment.  Review of Systems  All other systems reviewed and are negative.       Objective:  Physical Exam General: AAO x3, NAD  Dermatological: Thick, dry, xerotic skin present bilateral feet theplantarlateralaspectoftheheel.Thereisnoopenlesionsorpre-ulcerlesionswithinforthistime.  Vascular: Dorsalis Pedis artery and Posterior Tibial artery pedal pulses are 2/4 bilateral with immedate capillary fill time. There is no pain with calf compression, swelling, warmth, erythema.   Neruologic: Grossly intact via light touch bilateral. Protective threshold with Semmes Wienstein monofilament intact to all pedal sites bilateral.   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 Minimal tenderness on patient complaining site. There is tenderness on the insertion of the plantar fascia laterally. There is no pain on the course of plantar fascial ligament appears intact. Achilles tendon intact. There is no pain with lateral compression of the calcaneus. There is no area pinpoint bony tenderness or pain the vibratory sensation.  Gait: Unassisted, Nonantalgic.      Assessment:     41 year old female with bilateral dry skin; generalized foot pain likely due to biomechanical changes.    Plan:     -Treatment options discussed including all alternatives, risks, and complications -Etiology of symptoms were discussed  1. Dry skin -Urea cream daily.  -Currently no fissuring  of the skin there is no open sores. If there is early call the office in use and use antibiotic ointment. Monitor for infection.  2. Foot pain; bunion/plantar fasciitis although mild -X-rays were obtained and reviewed with the patient. HAV is present. No evidence of acute fracture. -Stretching, icing exercises daily. -Powersteps dispensed. -Discussed change in shoe gear as well.  RTC 3 months or sooner if needed. Call any questions or concerns.  Celesta Gentile, DPM

## 2017-01-18 ENCOUNTER — Ambulatory Visit: Payer: Medicare Other | Admitting: Podiatry

## 2017-05-18 ENCOUNTER — Encounter (HOSPITAL_COMMUNITY): Payer: Self-pay | Admitting: Emergency Medicine

## 2017-05-18 ENCOUNTER — Emergency Department (HOSPITAL_COMMUNITY): Payer: Medicare Other

## 2017-05-18 ENCOUNTER — Other Ambulatory Visit: Payer: Self-pay

## 2017-05-18 ENCOUNTER — Emergency Department (HOSPITAL_COMMUNITY)
Admission: EM | Admit: 2017-05-18 | Discharge: 2017-05-18 | Disposition: A | Discharge status: Home / Self Care | Payer: Medicare Other | Attending: Emergency Medicine | Admitting: Emergency Medicine

## 2017-05-18 DIAGNOSIS — G8929 Other chronic pain: Secondary | ICD-10-CM | POA: Diagnosis not present

## 2017-05-18 DIAGNOSIS — Z79899 Other long term (current) drug therapy: Secondary | ICD-10-CM | POA: Diagnosis not present

## 2017-05-18 DIAGNOSIS — M545 Low back pain: Secondary | ICD-10-CM | POA: Insufficient documentation

## 2017-05-18 DIAGNOSIS — Z992 Dependence on renal dialysis: Secondary | ICD-10-CM | POA: Diagnosis not present

## 2017-05-18 DIAGNOSIS — I120 Hypertensive chronic kidney disease with stage 5 chronic kidney disease or end stage renal disease: Secondary | ICD-10-CM | POA: Insufficient documentation

## 2017-05-18 DIAGNOSIS — R079 Chest pain, unspecified: Secondary | ICD-10-CM | POA: Diagnosis present

## 2017-05-18 DIAGNOSIS — Z7722 Contact with and (suspected) exposure to environmental tobacco smoke (acute) (chronic): Secondary | ICD-10-CM | POA: Insufficient documentation

## 2017-05-18 DIAGNOSIS — N186 End stage renal disease: Secondary | ICD-10-CM | POA: Diagnosis not present

## 2017-05-18 LAB — CBC
HCT: 38.5 % (ref 36.0–46.0)
Hemoglobin: 11.7 g/dL — ABNORMAL LOW (ref 12.0–15.0)
MCH: 26.7 pg (ref 26.0–34.0)
MCHC: 30.4 g/dL (ref 30.0–36.0)
MCV: 87.7 fL (ref 78.0–100.0)
PLATELETS: 214 10*3/uL (ref 150–400)
RBC: 4.39 MIL/uL (ref 3.87–5.11)
RDW: 19.4 % — AB (ref 11.5–15.5)
WBC: 4.9 10*3/uL (ref 4.0–10.5)

## 2017-05-18 LAB — I-STAT TROPONIN, ED
Troponin i, poc: 0 ng/mL (ref 0.00–0.08)
Troponin i, poc: 0 ng/mL (ref 0.00–0.08)

## 2017-05-18 LAB — COMPREHENSIVE METABOLIC PANEL
ALT: 7 U/L — AB (ref 14–54)
AST: 11 U/L — AB (ref 15–41)
Albumin: 3.7 g/dL (ref 3.5–5.0)
Alkaline Phosphatase: 61 U/L (ref 38–126)
Anion gap: 15 (ref 5–15)
BILIRUBIN TOTAL: 0.7 mg/dL (ref 0.3–1.2)
BUN: 46 mg/dL — AB (ref 6–20)
CHLORIDE: 94 mmol/L — AB (ref 101–111)
CO2: 28 mmol/L (ref 22–32)
CREATININE: 13.53 mg/dL — AB (ref 0.44–1.00)
Calcium: 8.8 mg/dL — ABNORMAL LOW (ref 8.9–10.3)
GFR calc Af Amer: 3 mL/min — ABNORMAL LOW (ref >60)
GFR, EST NON AFRICAN AMERICAN: 3 mL/min — AB (ref >60)
Glucose, Bld: 97 mg/dL (ref 65–99)
Potassium: 4.3 mmol/L (ref 3.5–5.1)
Sodium: 137 mmol/L (ref 135–145)
TOTAL PROTEIN: 8 g/dL (ref 6.5–8.1)

## 2017-05-18 LAB — LIPASE, BLOOD: LIPASE: 76 U/L — AB (ref 11–51)

## 2017-05-18 LAB — I-STAT BETA HCG BLOOD, ED (MC, WL, AP ONLY): I-stat hCG, quantitative: <5 m[IU]/mL (ref <5)

## 2017-05-18 MED ORDER — OXYCODONE HCL 5 MG PO TABS
10.0000 mg | ORAL_TABLET | Freq: Once | ORAL | Status: AC
  Administered 2017-05-18: 10 mg via ORAL
  Filled 2017-05-18: qty 2

## 2017-05-18 MED ORDER — CYCLOBENZAPRINE HCL 10 MG PO TABS: 10.0000 mg | ORAL_TABLET | Freq: Two times a day (BID) | ORAL | 0 refills | Status: DC | PRN

## 2017-05-18 MED ORDER — ONDANSETRON 4 MG PO TBDP: 4.0000 mg | ORAL_TABLET | Freq: Three times a day (TID) | ORAL | 0 refills | Status: DC | PRN

## 2017-05-18 MED ORDER — LIDOCAINE 5 % EX PTCH: 1.0000 | MEDICATED_PATCH | CUTANEOUS | 0 refills | Status: DC

## 2017-05-18 NOTE — ED Triage Notes (Signed)
Pt arrives via POV from with right sided chest pain that radiates to her right arm when she woke this morning. Reports some SOB and headache. Lung CTA. Reports some emesis and nausea. Denies recent cough, fever. VSS. Pt is due today for dialysis, schedule is Tues, TH, Sat.

## 2017-05-18 NOTE — ED Provider Notes (Signed)
[facility] EMERGENCY DEPARTMENT Provider Note   CSN: [id number] Arrival date & time: 05/18/17  1000     History   Chief Complaint No chief complaint on file.   HPI Desiree [name] is a 42 y.o. female.  HPI   Woke up in the middle of the night choking, short of breath Went back to try sleep, then woke up with chest pain Has sharp chest pain, smashing nagging hurting pain. Now is nagging in themiddle of the chest,  Feels a little better on her side.  Laying flat makes it worse.  Pain was going across the chest and down into right arm which felt numb. Took 1 aspirin 81mg .  No chest pain like this before.  Pain 7-8/10.  Not feeling short of breath now, feeling heavy. Coughing up clear sputum, foamy. Was on oxygen during dialysis yesterday. No leg swelling.    Throwing up started yesterday.  Nausea. Had headache yesterday. Threw up x2.   Mom had MI in her 23s  Back pain is chronic, hurting now.    ESRD, was supposed to go to dialysis  today, but was out of town over the weekend and had full treatment yesterday and was supposed to go back today but because of symptoms came back came to ED Needs PCP, had [facility]  Took off more fluid yesterday at dialysis, reports was at dry weight but then also states they had talked about lowering her dry weight and that she may have been over Past Medical History:  Diagnosis Date  . Childhood asthma   . Complication of anesthesia    "sometimes it does not work; didn't during [place]" (01/21/2016)  . ESRD (end stage renal disease) on dialysis ([place])    "TTS; [facility]; [facility]" (08/03/2016)  . GERD (gastroesophageal reflux disease)    nexium prn  . Gout   . History of blood transfusion    "related to kidneys; I've had 4" (01/21/2016)  . Hypertension   . Migraine    "qod now" (01/21/2016)  . Preterm labor ~ 2014  . Renal insufficiency   . Seizures ([place])    "last one was in 2000; related to preeclampsia" (01/21/2016)      Patient Active Problem List   Diagnosis Date Noted  . Abdominal pain 08/03/2016  . Chronic pain 01/21/2016  . Muscle cramps 03/29/2015  . Anemia of chronic kidney failure 09/15/2013  . Cocaine abuse ([place]) 08/26/2013  . Depression 04/21/2012  . ESRD (end stage renal disease) ([place]) 08/03/2011    Past Surgical History:  Procedure Laterality Date  . AV FISTULA PLACEMENT Left 2010  . CERVICAL BIOPSY  W/ LOOP ELECTRODE EXCISION  2001  . DILATION AND EVACUATION  08/02/2011   Procedure: DILATATION AND EVACUATION;  Surgeon: Logan Bores, MD;  Location: [facility];  Service: Gynecology;;  . Brigitte Pulse AND EVACUATION N/A 08/31/2013   Procedure: DILATATION AND EVACUATION;  Surgeon: Woodroe Mode, MD;  Location: [facility];  Service: Gynecology;  Laterality: N/A;  . RENAL BIOPSY    . REVISION OF ARTERIOVENOUS GORETEX GRAFT Left 02/11/2013   Procedure: REVISION OF ARTERIOVENOUS GORTEX FISTULA;  Surgeon: Rosetta Posner, MD;  Location: [facility];  Service: Vascular;  Laterality: Left;    OB History    Gravida Para Term Preterm AB Living   6 1 1  0 4 1   SAB TAB Ectopic Multiple Live Births   2 2 0 0 1       Home  Medications    Prior to Admission medications   Medication Sig Start Date End Date Taking? Authorizing Provider  albuterol (PROVENTIL HFA;VENTOLIN HFA) 108 (90 Base) MCG/ACT inhaler Inhale 2 puffs into the lungs every 4 (four) hours as needed for wheezing or shortness of breath. 05/02/16  Yes Forde Dandy, MD  cinacalcet (SENSIPAR) 30 MG tablet Take 60 mg by mouth every evening.    Yes [provider]  fluticasone-salmeterol (ADVAIR HFA) 45-21 MCG/ACT inhaler Inhale 2 puffs into the lungs 2 (two) times daily.   Yes [provider]  gabapentin (NEURONTIN) 100 MG capsule Take 3 capsules (300 mg total) by mouth 2 (two) times daily. 08/04/16  Yes Asencion Partridge, MD  Oxycodone HCl 10 MG TABS Take 1 tablet (10 mg total) by mouth daily. 08/04/16  Yes Asencion Partridge, MD  sevelamer  carbonate (RENVELA) 800 MG tablet Take 2,400 mg by mouth 3 (three) times daily with meals.   Yes [provider]  sodium bicarbonate 325 MG tablet Take 325 mg by mouth 2 (two) times daily.   Yes [provider]  cyclobenzaprine (FLEXERIL) 10 MG tablet Take 1 tablet (10 mg total) by mouth 2 (two) times daily as needed for muscle spasms. 05/18/17   Gareth Morgan, MD  lidocaine (LIDODERM) 5 % Place 1 patch onto the skin daily. Remove & Discard patch within 12 hours or as directed by MD 05/18/17   Gareth Morgan, MD  ondansetron (ZOFRAN ODT) 4 MG disintegrating tablet Take 1 tablet (4 mg total) by mouth every 8 (eight) hours as needed for nausea or vomiting. 05/18/17   Gareth Morgan, MD    Family History Family History  Problem Relation Age of Onset  . Diabetes Mother   . Hyperlipidemia Mother   . Hypertension Mother   . Heart disease Mother   . Hypertension Unknown   . Diabetes type II Unknown     Social History Social History   Tobacco Use  . Smoking status: Passive Smoke Exposure - Never Smoker  . Smokeless tobacco: Never Used  Substance Use Topics  . Alcohol use: No  . Drug use: Yes    Types: Marijuana    Comment: 01/21/2016 "maybe 3 times/week"     Allergies   Morphine; Prednisone; [place] [fish allergy]; Iodinated diagnostic agents; and Tape   Review of Systems Review of Systems  Constitutional: Negative for fever.  HENT: Negative for sore throat.   Eyes: Negative for visual disturbance.  Respiratory: Positive for cough and shortness of breath.   Cardiovascular: Positive for chest pain. Negative for leg swelling.  Gastrointestinal: Positive for nausea and vomiting. Negative for abdominal pain, constipation and diarrhea.  Genitourinary: Negative for difficulty urinating.  Musculoskeletal: Negative for back pain and neck pain.  Skin: Negative for rash.  Neurological: Negative for syncope and headaches.     Physical Exam Updated Vital Signs BP  127/71   Pulse 71   Temp 98.7 F (37.1 C) (Oral)   Resp 11   Ht 5\' 5"  (1.651 m)   Wt 91.5 kg (201 lb 11.5 oz)   LMP 04/21/2017   SpO2 98%   BMI 33.57 kg/m   Physical Exam  Constitutional: She is oriented to person, place, and time. She appears well-developed and well-nourished. No distress.  HENT:  Head: Normocephalic and atraumatic.  Eyes: Conjunctivae and EOM are normal.  Neck: Normal range of motion.  Cardiovascular: Normal rate, regular rhythm, normal heart sounds and intact distal pulses. Exam reveals no gallop and no  friction rub.  No murmur heard. Pulmonary/Chest: Effort normal and breath sounds normal. No respiratory distress. She has no wheezes. She has no rales.  Abdominal: Soft. She exhibits no distension. There is no tenderness. There is no guarding.  Musculoskeletal: She exhibits tenderness (lumbar spine). She exhibits no edema.  Neurological: She is alert and oriented to person, place, and time.  Skin: Skin is warm and dry. No rash noted. She is not diaphoretic. No erythema.  Nursing note and vitals reviewed.    ED Treatments / Results  Labs (all labs ordered are listed, but only abnormal results are displayed) Labs Reviewed  CBC - Abnormal; Notable for the following components:      Result Value   Hemoglobin 11.7 (*)    RDW 19.4 (*)    All other components within normal limits  COMPREHENSIVE METABOLIC PANEL - Abnormal; Notable for the following components:   Chloride 94 (*)    BUN 46 (*)    Creatinine, Ser 13.53 (*)    Calcium 8.8 (*)    AST 11 (*)    ALT 7 (*)    GFR calc non Af Amer 3 (*)    GFR calc Af Amer 3 (*)    All other components within normal limits  LIPASE, BLOOD - Abnormal; Notable for the following components:   Lipase 76 (*)    All other components within normal limits  I-STAT TROPONIN, ED  I-STAT BETA HCG BLOOD, ED ([facility], [facility], [facility] ONLY)  I-STAT TROPONIN, ED    EKG  EKG Interpretation  Date/Time:  Tuesday May 18 2017 10:10:44  EST Ventricular Rate:  86 PR Interval:  122 QRS Duration: 66 QT Interval:  394 QTC Calculation: 471 R Axis:   3 Text Interpretation:  Normal sinus rhythm Normal ECG No significant change since last tracing Confirmed by Gareth Morgan [phone]) on 05/18/2017 1:41:33 PM       Radiology Dg Chest 2 View  Result Date: 05/18/2017 CLINICAL DATA:  RIGHT side chest pain and RIGHT axillary pain since last night, shortness of breath, hypertension, asthma, seizures, end-stage renal disease on dialysis EXAM: CHEST  2 VIEW COMPARISON:  08/03/2016 FINDINGS: Normal heart size, mediastinal contours, and pulmonary vascularity. Decreased peribronchial thickening since previous exam. No acute infiltrate, pleural effusion or pneumothorax. Bones unremarkable. IMPRESSION: No acute abnormalities. Vascular congestion and fluid overload seen on previous exam no longer identified. Electronically Signed   By: Lavonia Dana M.D.   On: 05/18/2017 12:27    Procedures Procedures (including critical care time)  Medications Ordered in ED Medications  oxyCODONE (Oxy IR/ROXICODONE) immediate release tablet 10 mg (10 mg Oral Given 05/18/17 1618)     Initial Impression / Assessment and Plan / ED Course  I have reviewed the triage vital signs and the nursing notes.  Pertinent labs & imaging results that were available during my care of the patient were reviewed by me and considered in my medical decision making (see chart for details).     42yo female with history of htn, ESRD and chronic pain presents with concern for chest pain.   Differential diagnosis for chest pain includes pulmonary embolus, dissection, pneumothorax, pneumonia, ACS, myocarditis, pericarditis.  EKG was done and evaluate by me and showed no acute ST changes and no signs of pericarditis. Chest x-ray was done and evaluated by me and radiology and showed no sign of pneumonia or pneumothorax. Patient is PERC negative and low risk [place] and have low suspicion  for PE.  Normal upper  and lower ext pulses bilaterally, low suspicion for dissection.  Patient is low risk HEART score of 3 and had delta troponins which were both negative. Given this evaluation, history and physical have low suspicion for pulmonary embolus, pneumonia, ACS, myocarditis, pericarditis, dissection.  History of waking up with dyspnea concerning for fluid overload, however XR does not show signs of this, no hypoxia or tachypnea and do not feel she needs emergent dialysis or admission.  Recommend pt have dialysis tomorrow.  In addition, she is a patient of Dr. Noland Fordyce and no longer has a PCP or someone to prescribe her pain and anxiety medications.  Discussed with case management and will arrange outpt follow up to establish PCP, establish with Dr. Darleene Cleaver for psychiatry.  Given rx for flexeril, lidocaine patch, zofran, in the event that she is not able to fill her chronically rx narcotic medications. Patient discharged in stable condition with understanding of reasons to return.   Final Clinical Impressions(s) / ED Diagnoses   Final diagnoses:  Chest pain, unspecified type  Chronic midline low back pain, with sciatica presence unspecified    ED Discharge Orders        Ordered    ondansetron (ZOFRAN ODT) 4 MG disintegrating tablet  Every 8 hours PRN     05/18/17 1723    cyclobenzaprine (FLEXERIL) 10 MG tablet  2 times daily PRN     05/18/17 1723    lidocaine (LIDODERM) 5 %  Every 24 hours     05/18/17 1723       Gareth Morgan, MD 05/19/17 [phone]

## 2017-05-19 ENCOUNTER — Telehealth: Payer: Self-pay

## 2017-05-19 ENCOUNTER — Telehealth: Payer: Self-pay | Admitting: Surgery

## 2017-05-19 NOTE — Telephone Encounter (Signed)
Message received from Laurena Slimmer, RN CM requesting a hospital follow up appointment for the patient at [facility]. She is in need of a new PCP.  Call placed to the patient and an appointment was scheduled for 05/26/17 @ 1000. The patient stated that she has the address for the clinic.  Update provided to Wendi Maya, RN CM

## 2017-05-19 NOTE — Telephone Encounter (Addendum)
ED CM contacted Dr. Darleene Cleaver office regarding [facility] referral unable to reach schedule CM will retry tomorrow afternoon 3/7.

## 2017-05-20 ENCOUNTER — Telehealth: Payer: Self-pay | Admitting: Surgery

## 2017-05-20 NOTE — Telephone Encounter (Signed)
ED CM attempted to contact patient regarding referral to [facility] appointment schedule for Monday 3/18 at 3;45 pm CM will make second attempt later tonight.

## 2017-05-26 ENCOUNTER — Encounter: Payer: Self-pay | Admitting: Family Medicine

## 2017-05-26 ENCOUNTER — Ambulatory Visit: Payer: Medicare Other | Attending: Family Medicine | Admitting: Family Medicine

## 2017-05-26 VITALS — BP 131/83 | HR 82 | Temp 98.6°F | Ht 65.0 in | Wt 205.0 lb

## 2017-05-26 DIAGNOSIS — G4700 Insomnia, unspecified: Secondary | ICD-10-CM | POA: Insufficient documentation

## 2017-05-26 DIAGNOSIS — G8929 Other chronic pain: Secondary | ICD-10-CM

## 2017-05-26 DIAGNOSIS — Z79899 Other long term (current) drug therapy: Secondary | ICD-10-CM | POA: Diagnosis not present

## 2017-05-26 DIAGNOSIS — Z7722 Contact with and (suspected) exposure to environmental tobacco smoke (acute) (chronic): Secondary | ICD-10-CM | POA: Insufficient documentation

## 2017-05-26 DIAGNOSIS — M545 Low back pain: Secondary | ICD-10-CM | POA: Diagnosis not present

## 2017-05-26 DIAGNOSIS — M419 Scoliosis, unspecified: Secondary | ICD-10-CM | POA: Diagnosis not present

## 2017-05-26 DIAGNOSIS — F319 Bipolar disorder, unspecified: Secondary | ICD-10-CM | POA: Diagnosis not present

## 2017-05-26 DIAGNOSIS — Z992 Dependence on renal dialysis: Secondary | ICD-10-CM | POA: Insufficient documentation

## 2017-05-26 DIAGNOSIS — J45909 Unspecified asthma, uncomplicated: Secondary | ICD-10-CM | POA: Diagnosis not present

## 2017-05-26 DIAGNOSIS — D631 Anemia in chronic kidney disease: Secondary | ICD-10-CM | POA: Diagnosis not present

## 2017-05-26 DIAGNOSIS — Z885 Allergy status to narcotic agent status: Secondary | ICD-10-CM | POA: Diagnosis not present

## 2017-05-26 DIAGNOSIS — Z888 Allergy status to other drugs, medicaments and biological substances status: Secondary | ICD-10-CM | POA: Diagnosis not present

## 2017-05-26 DIAGNOSIS — K219 Gastro-esophageal reflux disease without esophagitis: Secondary | ICD-10-CM | POA: Diagnosis not present

## 2017-05-26 DIAGNOSIS — I120 Hypertensive chronic kidney disease with stage 5 chronic kidney disease or end stage renal disease: Secondary | ICD-10-CM | POA: Insufficient documentation

## 2017-05-26 DIAGNOSIS — M109 Gout, unspecified: Secondary | ICD-10-CM | POA: Diagnosis not present

## 2017-05-26 DIAGNOSIS — N186 End stage renal disease: Secondary | ICD-10-CM

## 2017-05-26 NOTE — Progress Notes (Signed)
Subjective:  Patient ID: Desiree [name], female    DOB: 11-26-1975  Age: 42 y.o. MRN: [id number]  CC: Establish Care and Hospitalization Follow-up   HPI Desiree [name] is a 42 y/o female with medical history of childhood asthma, scoliosis, nephrotic syndrome, ESRD (hemodialysis T/Th/S), anemia d/t CKD, depression, and chronic pain. She presents today to establish care, post-hospital f/u, and chronic pain.   Hospital F/u: Patient was seen in ER on 05/18/17 for cp and chronic lumbar pain. Cardiac enzymes, ECG, CXR were unremarkable for acute changes. Patient was referred to clinic to establish primary care for chronic conditions after discontinued care from Dr. Ricke Hey. Reports feeling well. Denies cp, sob, cough, pedal edema, recurrent headaches, or increased fatigue/generalized weakness. Reports baseline dyspnea on exertion with no acute changes or worsening of symptoms.   ESRD: She receives hemodialysis T/Th/S at [facility] in [place]. Last dialysis was yesterday with removal of 3.5 kg; reports stable labs and weight. Denies complications with dialysis; produces little to none urinary output, at baseline. Reports onset of dialysis 9 years ago, at age 74, related to medical history of childhood nephrotic syndrome.   Depression: Reports chronic mood disorder symptoms with intermittent anger, panic attacks, night terrors, and insomnia. Patient is scheduled to establish care with psychiatry at [facility], on Monday 05/31/17, from a referral from her last ER visit. She is currently taking Xanax previously prescribed by Dr. Alyson Ingles, 2 mg, tid. Last prescribed was 05/04/17. Reports her moods are drastically improved with medication regimen. Denies loss of interest, seclusion from others, verbal outburst, or SI/HI.   Chronic pain: Reports chronic pain and nerve-like pain throughout her back from childhood scoliosis. She currently has lumbar pain radiating down her legs, bilaterally. She  has been taking oxycodone 10 mg, from previous prescription from Dr. Alyson Ingles on 05/04/17. Reports pain at 8/10, with last oxycodone last night. Denies effective pain management with the use of flexeril. Denies loss of bowels or bladder, falls, or unsteady gait. Reports pain in her LUE at her dialysis cath. She states she as nerve endings that grew around her fistula and she has recurrent pain with each use for dialysis. Denies complications with fistula or hematoma presence. Patient was referred to pain clinic, but unable to get an appointment d/t lack of medical records from previous PCP.   Healthcare Maintenance: Reports received flu and Tdap vaccination at dialysis clinic. She received her pap smear at [facility] last year. Last Ophthalmology exam was last year. Reports floaters and abnormal findings in the left eye with reduced visibility.   Past Medical History:  Diagnosis Date  . Childhood asthma   . Complication of anesthesia    "sometimes it does not work; didn't during [place]" (01/21/2016)  . ESRD (end stage renal disease) on dialysis ([place])    "TTS; [facility]; [facility]" (08/03/2016)  . GERD (gastroesophageal reflux disease)    nexium prn  . Gout   . History of blood transfusion    "related to kidneys; I've had 4" (01/21/2016)  . Hypertension   . Migraine    "qod now" (01/21/2016)  . Preterm labor ~ 2014  . Renal insufficiency   . Seizures ([place])    "last one was in 2000; related to preeclampsia" (01/21/2016)   Past Surgical History:  Procedure Laterality Date  . AV FISTULA PLACEMENT Left 2010  . CERVICAL BIOPSY  W/ LOOP ELECTRODE EXCISION  2001  . DILATION AND EVACUATION  08/02/2011   Procedure: DILATATION  AND EVACUATION;  Surgeon: Logan Bores, MD;  Location: [facility];  Service: Gynecology;;  . Brigitte Pulse AND EVACUATION N/A 08/31/2013   Procedure: DILATATION AND EVACUATION;  Surgeon: Woodroe Mode, MD;  Location: [facility];  Service: Gynecology;  Laterality: N/A;   . RENAL BIOPSY    . REVISION OF ARTERIOVENOUS GORETEX GRAFT Left 02/11/2013   Procedure: REVISION OF ARTERIOVENOUS GORTEX FISTULA;  Surgeon: Rosetta Posner, MD;  Location: [facility];  Service: Vascular;  Laterality: Left;   Patient Active Problem List   Diagnosis Date Noted  . Abdominal pain 08/03/2016  . Chronic pain 01/21/2016  . Muscle cramps 03/29/2015  . Anemia of chronic kidney failure 09/15/2013  . Cocaine abuse ([place]) 08/26/2013  . Depression 04/21/2012  . ESRD (end stage renal disease) ([place]) 08/03/2011   Social History   Socioeconomic History  . Marital status: Married    Spouse name: Not on file  . Number of children: Not on file  . Years of education: Not on file  . Highest education level: Not on file  Social Needs  . Financial resource strain: Not on file  . Food insecurity - worry: Not on file  . Food insecurity - inability: Not on file  . Transportation needs - medical: Not on file  . Transportation needs - non-medical: Not on file  Occupational History  . Not on file  Tobacco Use  . Smoking status: Passive Smoke Exposure - Never Smoker  . Smokeless tobacco: Never Used  Substance and Sexual Activity  . Alcohol use: No  . Drug use: Yes    Types: Marijuana    Comment: 01/21/2016 "maybe 3 times/week"  . Sexual activity: Yes    Birth control/protection: None  Other Topics Concern  . Not on file  Social History Narrative  . Not on file   Outpatient Medications Prior to Visit  Medication Sig Dispense Refill  . albuterol (PROVENTIL HFA;VENTOLIN HFA) 108 (90 Base) MCG/ACT inhaler Inhale 2 puffs into the lungs every 4 (four) hours as needed for wheezing or shortness of breath. 1 Inhaler 2  . cinacalcet (SENSIPAR) 30 MG tablet Take 60 mg by mouth every evening.     . cyclobenzaprine (FLEXERIL) 10 MG tablet Take 1 tablet (10 mg total) by mouth 2 (two) times daily as needed for muscle spasms. 20 tablet 0  . gabapentin (NEURONTIN) 100 MG capsule Take 3 capsules (300 mg  total) by mouth 2 (two) times daily. 90 capsule 0  . ondansetron (ZOFRAN ODT) 4 MG disintegrating tablet Take 1 tablet (4 mg total) by mouth every 8 (eight) hours as needed for nausea or vomiting. 20 tablet 0  . sevelamer carbonate (RENVELA) 800 MG tablet Take 2,400 mg by mouth 3 (three) times daily with meals.    . sodium bicarbonate 325 MG tablet Take 325 mg by mouth 2 (two) times daily.    . fluticasone-salmeterol (ADVAIR HFA) 45-21 MCG/ACT inhaler Inhale 2 puffs into the lungs 2 (two) times daily.    Marland Kitchen lidocaine (LIDODERM) 5 % Place 1 patch onto the skin daily. Remove & Discard patch within 12 hours or as directed by MD (Patient not taking: Reported on 05/26/2017) 30 patch 0  . Oxycodone HCl 10 MG TABS Take 1 tablet (10 mg total) by mouth daily. (Patient not taking: Reported on 05/26/2017) 5 tablet 0   No facility-administered medications prior to visit.    Allergies  Allergen Reactions  . Morphine Shortness Of Breath and Anaphylaxis  . Prednisone Other (See  Comments)    Other reaction(s): Other (See Comments) Muscle spasms Patient says prednisone causes her to cramp all over, muscle spasms uncontrolled  . Geralyn Flash [Fish Allergy] Itching, Swelling, Rash and Other (See Comments)    Face droops also  . Iodinated Diagnostic Agents Itching  . Tape Itching   ROS Review of Systems  Constitutional: Negative for activity change, appetite change, chills, fatigue, fever and unexpected weight change.  Eyes: Positive for visual disturbance (baseline L eye visual acuity loss).  Respiratory: Negative for cough, chest tightness, shortness of breath and wheezing.   Cardiovascular: Negative for chest pain, palpitations and leg swelling.  Gastrointestinal: Negative for abdominal distention, abdominal pain, blood in stool, constipation, nausea and vomiting.  Genitourinary: Positive for decreased urine volume (at baseline with ESRD). Negative for difficulty urinating.  Musculoskeletal: Positive for back  pain. Negative for arthralgias, gait problem, myalgias, neck pain and neck stiffness.  Skin: Negative for color change, rash and wound.  Neurological: Negative for dizziness, syncope, weakness, light-headedness, numbness and headaches.  Psychiatric/Behavioral: Negative for agitation, confusion, decreased concentration, dysphoric mood, hallucinations, self-injury, sleep disturbance and suicidal ideas. The patient is not nervous/anxious.   All other systems reviewed and are negative.  Objective:  BP 131/83   Pulse 82   Temp 98.6 F (37 C) (Oral)   Ht 5\' 5"  (1.651 m)   Wt 205 lb (93 kg)   SpO2 100%   BMI 34.11 kg/m   BP/Weight 05/26/2017 05/18/2017 0/62/6948  Systolic BP 546 270 350  Diastolic BP 83 71 90  Wt. (Lbs) 205 201.72 190  BMI 34.11 33.57 31.62  Some encounter information is confidential and restricted. Go to Review Flowsheets activity to see all data.   Marland KitchenlastchPhysical Exam  Physical Exam  Constitutional: She is oriented to person, place, and time and well-developed, well-nourished, and in no distress. No distress.  HENT:  Head: Normocephalic and atraumatic.  Eyes: Conjunctivae and EOM are normal.  Neck: Normal range of motion and full passive range of motion without pain. Neck supple. No hepatojugular reflux and no JVD present. No spinous process tenderness and no muscular tenderness present. Carotid bruit is not present. Normal range of motion present.  Cardiovascular: Normal heart sounds. Exam reveals no gallop and no friction rub.  No murmur heard. Pulses:      Radial pulses are 2+ on the right side, and 2+ on the left side.       Dorsalis pedis pulses are 2+ on the right side, and 2+ on the left side.  Negative pedal edema, + thrill and turbulent blood flow at LUE fistula.   Pulmonary/Chest: Effort normal and breath sounds normal. No respiratory distress. She has no wheezes. She exhibits no tenderness.  Abdominal: Soft. Bowel sounds are normal. She exhibits no  distension. There is no tenderness.  Musculoskeletal: Normal range of motion. She exhibits tenderness (lumbar tenderness and localized left lower arm tenderness at the fistula insertion site). She exhibits no edema.  Neurological: She is alert and oriented to person, place, and time. She has normal sensation and normal strength. Gait normal. Coordination normal.  Skin: Skin is warm, dry and intact. No rash noted. No erythema.  Psychiatric: Memory and judgment normal. Her mood appears anxious. She is agitated (slight irritable affect present).  Nursing note and vitals reviewed.  Assessment & Plan:   1. ESRD (end stage renal disease) ([place]), controlled - Patient is followed by [facility] for renal dialysis. Denies complication; she is asymptomatic for fluid retention. She reports  stable labs. Last Cr was 13.53 on 05/18/17.  - Continue f/u care with [facility].   2. Other chronic pain, uncontrolled - Reports chronic back pain and LUE fistula pain. She was a patient of Dr. Noland Fordyce, who's practice is closed. Reports taking oxycodone 10 mg and flexeril for previus pain management. Reports using gabapentin prn for restless leg.  - Patient previously referred to pain clinic; however, unable to get an appointment d/t need medical records from Dr. Noland Fordyce office.  - Offered other adjunctive pain medications. Patient is addiment about receiving her current medication regimen. Informed the clinic does not prescribe narcotics; preferably offer tramadol and tylenol with codeine. The patient is not a candidate for tylenol with codeine d//t morphine allergy.  - Patient declined assistance and stated she will seek care elsewhere.   3. Bipolar 1 disorder ([place]), uncontrolled - She is sparing out her medication from her last prescription, until her scheduled appointment on 05/31/17. Reports control of her mood symptoms with xanax. However, she presented to slight anxious and irritable affect. He  current affect is due to her uncontrolled pain management.  - Continue f/u with psychiatry as planned.   No orders of the defined types were placed in this encounter.   Follow-up: Return for follow up of chronic medical conditions, patient will be seeking primary care elsewhere.Torrie Mayers H. Hulan Fray, AGNP-DNP student

## 2017-08-27 ENCOUNTER — Other Ambulatory Visit: Payer: Self-pay

## 2017-08-27 DIAGNOSIS — R51 Headache: Secondary | ICD-10-CM | POA: Insufficient documentation

## 2017-08-27 DIAGNOSIS — Z5321 Procedure and treatment not carried out due to patient leaving prior to being seen by health care provider: Secondary | ICD-10-CM | POA: Insufficient documentation

## 2017-08-28 ENCOUNTER — Other Ambulatory Visit: Payer: Self-pay

## 2017-08-28 ENCOUNTER — Emergency Department (HOSPITAL_COMMUNITY)
Admission: EM | Admit: 2017-08-28 | Discharge: 2017-08-28 | Disposition: A | Discharge status: Home / Self Care | Payer: Medicare Other | Attending: Emergency Medicine | Admitting: Emergency Medicine

## 2017-08-28 ENCOUNTER — Emergency Department (HOSPITAL_COMMUNITY): Payer: Medicare Other

## 2017-08-28 ENCOUNTER — Encounter (HOSPITAL_COMMUNITY): Payer: Self-pay | Admitting: Emergency Medicine

## 2017-08-28 DIAGNOSIS — R51 Headache: Secondary | ICD-10-CM | POA: Diagnosis not present

## 2017-08-28 LAB — BASIC METABOLIC PANEL
ANION GAP: 17 — AB (ref 5–15)
BUN: 60 mg/dL — ABNORMAL HIGH (ref 6–20)
CALCIUM: 9.3 mg/dL (ref 8.9–10.3)
CHLORIDE: 95 mmol/L — AB (ref 101–111)
CO2: 28 mmol/L (ref 22–32)
Creatinine, Ser: 20.61 mg/dL — ABNORMAL HIGH (ref 0.44–1.00)
GFR calc Af Amer: 2 mL/min — ABNORMAL LOW (ref >60)
GFR calc non Af Amer: 2 mL/min — ABNORMAL LOW (ref >60)
Glucose, Bld: 91 mg/dL (ref 65–99)
POTASSIUM: 3.8 mmol/L (ref 3.5–5.1)
Sodium: 140 mmol/L (ref 135–145)

## 2017-08-28 LAB — CBC
HEMATOCRIT: 33.9 % — AB (ref 36.0–46.0)
HEMOGLOBIN: 10.3 g/dL — AB (ref 12.0–15.0)
MCH: 25.8 pg — AB (ref 26.0–34.0)
MCHC: 30.4 g/dL (ref 30.0–36.0)
MCV: 84.8 fL (ref 78.0–100.0)
Platelets: 290 10*3/uL (ref 150–400)
RBC: 4 MIL/uL (ref 3.87–5.11)
RDW: 16.5 % — ABNORMAL HIGH (ref 11.5–15.5)
WBC: 6.6 10*3/uL (ref 4.0–10.5)

## 2017-08-28 LAB — I-STAT TROPONIN, ED: TROPONIN I, POC: 0.02 ng/mL (ref 0.00–0.08)

## 2017-08-28 LAB — I-STAT BETA HCG BLOOD, ED (MC, WL, AP ONLY): I-stat hCG, quantitative: <5 m[IU]/mL (ref <5)

## 2017-08-28 NOTE — ED Notes (Signed)
Called for vitals. No answer x3

## 2017-08-28 NOTE — ED Triage Notes (Signed)
C/o headache, stinging to center of chest, and sob since waking up Friday morning.  Pt goes to dialysis Tuesday, Thursday, Saturday.  No neuro deficits noted.

## 2017-08-28 NOTE — ED Notes (Signed)
Patient left after triage

## 2017-09-05 ENCOUNTER — Emergency Department (HOSPITAL_COMMUNITY)
Admission: EM | Admit: 2017-09-05 | Discharge: 2017-09-05 | Disposition: A | Discharge status: Home / Self Care | Payer: Medicare Other | Attending: Emergency Medicine | Admitting: Emergency Medicine

## 2017-09-05 DIAGNOSIS — I120 Hypertensive chronic kidney disease with stage 5 chronic kidney disease or end stage renal disease: Secondary | ICD-10-CM | POA: Diagnosis not present

## 2017-09-05 DIAGNOSIS — N186 End stage renal disease: Secondary | ICD-10-CM | POA: Diagnosis not present

## 2017-09-05 DIAGNOSIS — Z7722 Contact with and (suspected) exposure to environmental tobacco smoke (acute) (chronic): Secondary | ICD-10-CM | POA: Diagnosis not present

## 2017-09-05 DIAGNOSIS — R220 Localized swelling, mass and lump, head: Secondary | ICD-10-CM | POA: Diagnosis present

## 2017-09-05 DIAGNOSIS — Z992 Dependence on renal dialysis: Secondary | ICD-10-CM | POA: Insufficient documentation

## 2017-09-05 DIAGNOSIS — T783XXA Angioneurotic edema, initial encounter: Secondary | ICD-10-CM | POA: Diagnosis not present

## 2017-09-05 DIAGNOSIS — Z79899 Other long term (current) drug therapy: Secondary | ICD-10-CM | POA: Diagnosis not present

## 2017-09-05 MED ORDER — DIPHENHYDRAMINE HCL 25 MG PO TABS: 25.0000 mg | ORAL_TABLET | Freq: Four times a day (QID) | ORAL | 0 refills | Status: DC

## 2017-09-05 MED ORDER — FAMOTIDINE 20 MG PO TABS: 20.0000 mg | ORAL_TABLET | Freq: Two times a day (BID) | ORAL | 0 refills | Status: DC

## 2017-09-05 MED ORDER — OXYCODONE-ACETAMINOPHEN 5-325 MG PO TABS
1.0000 | ORAL_TABLET | Freq: Once | ORAL | Status: AC
  Administered 2017-09-05: 1 via ORAL
  Filled 2017-09-05: qty 1

## 2017-09-05 NOTE — ED Notes (Signed)
D/c reviewed with patient and spouse 

## 2017-09-05 NOTE — Discharge Instructions (Signed)
Please return if you develop dental pain, fever, throat swelling, difficulty breathing or if you have other concerns. Take benadryl and pepcid as prescribed.  Stop taking amlodipine and discuss with your doctor about alternative medication as it may be causing your swelling.

## 2017-09-05 NOTE — ED Provider Notes (Signed)
[name] EMERGENCY DEPARTMENT Provider Note   CSN: [id number] Arrival date & time: 09/05/17  1155     History   Chief Complaint Chief Complaint  Patient presents with  . Allergic Reaction    Angioedema    HPI Desiree [name] is a 42 y.o. female.  HPI   42 year old female with history of end-stage renal disease currently on Tuesday Thursday Saturday dialysis, history of chronic pain, cocaine abuse brought here via EMS from home for evaluation of facial swelling.  Patient woke up this morning noticing swelling to the right side of her face near her lip.  Swelling was moderate this morning, with associated tenderness to the affected area.  Swelling did not start last night.  Pain is described as a tightness, moderate in severity, worsening with mild movement.  No fever, chills, lightheadedness, dizziness, dental pain, throat swelling, chest pain, shortness of breath, wheezing, abdominal cramping.  She has never had this kind of swelling before.  She recently was started on a new blood pressure medication, amlodipine 3 days ago.  She denies any other environmental changes.  Her last dialysis was yesterday.  Past Medical History:  Diagnosis Date  . Childhood asthma   . Complication of anesthesia    "sometimes it does not work; didn't during [place]" (01/21/2016)  . ESRD (end stage renal disease) on dialysis ([place])    "TTS; [facility]; [facility]" (08/03/2016)  . GERD (gastroesophageal reflux disease)    nexium prn  . Gout   . History of blood transfusion    "related to kidneys; I've had 4" (01/21/2016)  . Hypertension   . Migraine    "qod now" (01/21/2016)  . Preterm labor ~ 2014  . Renal insufficiency   . Seizures ([place])    "last one was in 2000; related to preeclampsia" (01/21/2016)    Patient Active Problem List   Diagnosis Date Noted  . Abdominal pain 08/03/2016  . Chronic pain 01/21/2016  . Muscle cramps 03/29/2015  . Anemia of chronic kidney  failure 09/15/2013  . Cocaine abuse ([place]) 08/26/2013  . Depression 04/21/2012  . ESRD (end stage renal disease) ([place]) 08/03/2011    Past Surgical History:  Procedure Laterality Date  . AV FISTULA PLACEMENT Left 2010  . CERVICAL BIOPSY  W/ LOOP ELECTRODE EXCISION  2001  . DILATION AND EVACUATION  08/02/2011   Procedure: DILATATION AND EVACUATION;  Surgeon: Logan Bores, MD;  Location: [facility];  Service: Gynecology;;  . Brigitte Pulse AND EVACUATION N/A 08/31/2013   Procedure: DILATATION AND EVACUATION;  Surgeon: Woodroe Mode, MD;  Location: [facility];  Service: Gynecology;  Laterality: N/A;  . RENAL BIOPSY    . REVISION OF ARTERIOVENOUS GORETEX GRAFT Left 02/11/2013   Procedure: REVISION OF ARTERIOVENOUS GORTEX FISTULA;  Surgeon: Rosetta Posner, MD;  Location: [facility];  Service: Vascular;  Laterality: Left;     OB History    Gravida  6   Para  1   Term  1   Preterm  0   AB  4   Living  1     SAB  2   TAB  2   Ectopic  0   Multiple  0   Live Births  1            Home Medications    Prior to Admission medications   Medication Sig Start Date End Date Taking? Authorizing Provider  albuterol (PROVENTIL HFA;VENTOLIN HFA) 108 (90 Base) MCG/ACT inhaler Inhale  2 puffs into the lungs every 4 (four) hours as needed for wheezing or shortness of breath. 05/02/16   Forde Dandy, MD  cinacalcet (SENSIPAR) 30 MG tablet Take 60 mg by mouth every evening.     [provider]  cyclobenzaprine (FLEXERIL) 10 MG tablet Take 1 tablet (10 mg total) by mouth 2 (two) times daily as needed for muscle spasms. 05/18/17   Gareth Morgan, MD  fluticasone-salmeterol (ADVAIR HFA) [phone] MCG/ACT inhaler Inhale 2 puffs into the lungs 2 (two) times daily.    [provider]  gabapentin (NEURONTIN) 100 MG capsule Take 3 capsules (300 mg total) by mouth 2 (two) times daily. 08/04/16   Asencion Partridge, MD  lidocaine (LIDODERM) 5 % Place 1 patch onto the skin daily. Remove & Discard patch  within 12 hours or as directed by MD Patient not taking: Reported on 05/26/2017 05/18/17   Gareth Morgan, MD  ondansetron (ZOFRAN ODT) 4 MG disintegrating tablet Take 1 tablet (4 mg total) by mouth every 8 (eight) hours as needed for nausea or vomiting. 05/18/17   Gareth Morgan, MD  Oxycodone HCl 10 MG TABS Take 1 tablet (10 mg total) by mouth daily. Patient not taking: Reported on 05/26/2017 08/04/16   Asencion Partridge, MD  sevelamer carbonate (RENVELA) 800 MG tablet Take 2,400 mg by mouth 3 (three) times daily with meals.    [provider]  sodium bicarbonate 325 MG tablet Take 325 mg by mouth 2 (two) times daily.    [provider]    Family History Family History  Problem Relation Age of Onset  . Diabetes Mother   . Hyperlipidemia Mother   . Hypertension Mother   . Heart disease Mother   . Hypertension Unknown   . Diabetes type II Unknown     Social History Social History   Tobacco Use  . Smoking status: Passive Smoke Exposure - Never Smoker  . Smokeless tobacco: Never Used  Substance Use Topics  . Alcohol use: No  . Drug use: Yes    Types: Marijuana    Comment: 01/21/2016 "maybe 3 times/week"     Allergies   Morphine; Prednisone; Kerr [fish allergy]; Iodinated diagnostic agents; and Tape   Review of Systems Review of Systems  All other systems reviewed and are negative.    Physical Exam Updated Vital Signs BP (!) 182/89 (BP Location: Right Arm)   Pulse 73   Temp 99.4 F (37.4 C) (Oral)   Resp 16   SpO2 100%   Physical Exam  Constitutional: She is oriented to person, place, and time. She appears well-developed and well-nourished. No distress.  HENT:  Head: Atraumatic.  Right side of face is edematous most significant around the corner of the right lip with tenderness to palpation but no surrounding skin erythema.  Poor dentition without dental pain.  No trismus.  Eyes: Conjunctivae are normal.  Neck: Normal range of motion. Neck supple.    Neck is supple, no tracheal deviation.  Cardiovascular: Normal rate and regular rhythm.  Pulmonary/Chest: Effort normal and breath sounds normal. She has no wheezes.  Abdominal: Soft. There is no tenderness.  Neurological: She is alert and oriented to person, place, and time.  Skin: No rash noted.  Psychiatric: She has a normal mood and affect.  Nursing note and vitals reviewed.    ED Treatments / Results  Labs (all labs ordered are listed, but only abnormal results are displayed) Labs Reviewed - No data to display  EKG None  Radiology No results found.  Procedures Procedures (including critical care time)  Medications Ordered in ED Medications  oxyCODONE-acetaminophen (PERCOCET/ROXICET) 5-325 MG per tablet 1 tablet (1 tablet Oral Given 09/05/17 1452)     Initial Impression / Assessment and Plan / ED Course  I have reviewed the triage vital signs and the nursing notes.  Pertinent labs & imaging results that were available during my care of the patient were reviewed by me and considered in my medical decision making (see chart for details).     BP (!) 157/98   Pulse 66   Temp 99.4 F (37.4 C) (Oral)   Resp 16   SpO2 100%    Final Clinical Impressions(s) / ED Diagnoses   Final diagnoses:  Angioedema, initial encounter    ED Discharge Orders        Ordered    diphenhydrAMINE (BENADRYL) 25 MG tablet  Every 6 hours     09/05/17 1530    famotidine (PEPCID) 20 MG tablet  2 times daily     09/05/17 1530     12:59 PM Patient here with right-sided facial swelling that started earlier this morning.  She also endorsing pain when I palpate the right side of her face but no true dental pain to suggest periapical abscess with facial involvement.  Patient recently takes blood pressure medication, amlodipine with 3 days ago which may cause angioedema.  No airway compromise.  Per nursing note, patient received 125 mg of Solu-Medrol, 2 mg of Zofran, 50 mg of Benadryl prior  to arrival.  Patient however mention she also received an epinephrine injection by EMS.  Patient currently without any urticaria, no airway compromise and she is not hypotensive.  Plan to continue to monitor.  3:26 PM Pt have been monitored in the ER for the past several hrs without worsening of sxs.  She's able to tolerates PO.  VSS.  Pt d/c home with prednisone, benadryl, pepcid.  Recommend return if she has dental pain, fever, airway compromise or other concerning changes.  Pt voice understand and agrees with plan.  Recommend discontinue amlodipine and to consult PCP for further management of her HTN.     Domenic Moras, PA-C 09/05/17 1532    Davonna Belling, MD 09/05/17 1550

## 2017-09-05 NOTE — ED Triage Notes (Signed)
Pt brought in by [facility] from home for left sided angioedema from her chin to her cheek. Per EMS pt denies tongue or throat swelling, no difficulty breathing. Per EMS pt lung sounds clear, 12-lead unremarkable, pt given 125 solumedrol, 4mg  zofran, 50 of benadryl. Pt receives dialysis, last treatment yesterday, restricted on right side. Pt A+Ox4 and in NAD on arrival.

## 2017-12-07 ENCOUNTER — Emergency Department (HOSPITAL_COMMUNITY): Payer: Medicare Other

## 2017-12-07 ENCOUNTER — Inpatient Hospital Stay (HOSPITAL_COMMUNITY)
Admission: EM | Admit: 2017-12-07 | Discharge: 2017-12-09 | DRG: 391 | Disposition: A | Discharge status: Home / Self Care | Payer: Medicare Other | Attending: Internal Medicine | Admitting: Internal Medicine

## 2017-12-07 ENCOUNTER — Encounter (HOSPITAL_COMMUNITY): Payer: Self-pay

## 2017-12-07 DIAGNOSIS — E8889 Other specified metabolic disorders: Secondary | ICD-10-CM | POA: Diagnosis present

## 2017-12-07 DIAGNOSIS — R509 Fever, unspecified: Secondary | ICD-10-CM | POA: Diagnosis not present

## 2017-12-07 DIAGNOSIS — J40 Bronchitis, not specified as acute or chronic: Secondary | ICD-10-CM | POA: Diagnosis not present

## 2017-12-07 DIAGNOSIS — J90 Pleural effusion, not elsewhere classified: Secondary | ICD-10-CM | POA: Diagnosis present

## 2017-12-07 DIAGNOSIS — A084 Viral intestinal infection, unspecified: Principal | ICD-10-CM | POA: Diagnosis present

## 2017-12-07 DIAGNOSIS — Z885 Allergy status to narcotic agent status: Secondary | ICD-10-CM

## 2017-12-07 DIAGNOSIS — Z8349 Family history of other endocrine, nutritional and metabolic diseases: Secondary | ICD-10-CM

## 2017-12-07 DIAGNOSIS — R197 Diarrhea, unspecified: Secondary | ICD-10-CM

## 2017-12-07 DIAGNOSIS — J811 Chronic pulmonary edema: Secondary | ICD-10-CM | POA: Diagnosis present

## 2017-12-07 DIAGNOSIS — E875 Hyperkalemia: Secondary | ICD-10-CM | POA: Diagnosis present

## 2017-12-07 DIAGNOSIS — E872 Acidosis: Secondary | ICD-10-CM | POA: Diagnosis present

## 2017-12-07 DIAGNOSIS — K828 Other specified diseases of gallbladder: Secondary | ICD-10-CM | POA: Diagnosis present

## 2017-12-07 DIAGNOSIS — M5441 Lumbago with sciatica, right side: Secondary | ICD-10-CM

## 2017-12-07 DIAGNOSIS — Z91041 Radiographic dye allergy status: Secondary | ICD-10-CM

## 2017-12-07 DIAGNOSIS — N2581 Secondary hyperparathyroidism of renal origin: Secondary | ICD-10-CM | POA: Diagnosis present

## 2017-12-07 DIAGNOSIS — M109 Gout, unspecified: Secondary | ICD-10-CM | POA: Diagnosis present

## 2017-12-07 DIAGNOSIS — G40909 Epilepsy, unspecified, not intractable, without status epilepticus: Secondary | ICD-10-CM | POA: Diagnosis present

## 2017-12-07 DIAGNOSIS — D631 Anemia in chronic kidney disease: Secondary | ICD-10-CM | POA: Diagnosis present

## 2017-12-07 DIAGNOSIS — N186 End stage renal disease: Secondary | ICD-10-CM | POA: Diagnosis not present

## 2017-12-07 DIAGNOSIS — Y92009 Unspecified place in unspecified non-institutional (private) residence as the place of occurrence of the external cause: Secondary | ICD-10-CM

## 2017-12-07 DIAGNOSIS — Z79891 Long term (current) use of opiate analgesic: Secondary | ICD-10-CM

## 2017-12-07 DIAGNOSIS — J45909 Unspecified asthma, uncomplicated: Secondary | ICD-10-CM | POA: Diagnosis present

## 2017-12-07 DIAGNOSIS — I120 Hypertensive chronic kidney disease with stage 5 chronic kidney disease or end stage renal disease: Secondary | ICD-10-CM | POA: Diagnosis present

## 2017-12-07 DIAGNOSIS — Z7722 Contact with and (suspected) exposure to environmental tobacco smoke (acute) (chronic): Secondary | ICD-10-CM | POA: Diagnosis present

## 2017-12-07 DIAGNOSIS — Z91018 Allergy to other foods: Secondary | ICD-10-CM

## 2017-12-07 DIAGNOSIS — F329 Major depressive disorder, single episode, unspecified: Secondary | ICD-10-CM | POA: Diagnosis present

## 2017-12-07 DIAGNOSIS — Z6831 Body mass index (BMI) 31.0-31.9, adult: Secondary | ICD-10-CM

## 2017-12-07 DIAGNOSIS — Z833 Family history of diabetes mellitus: Secondary | ICD-10-CM

## 2017-12-07 DIAGNOSIS — G8929 Other chronic pain: Secondary | ICD-10-CM | POA: Diagnosis present

## 2017-12-07 DIAGNOSIS — G43909 Migraine, unspecified, not intractable, without status migrainosus: Secondary | ICD-10-CM | POA: Diagnosis present

## 2017-12-07 DIAGNOSIS — W109XXA Fall (on) (from) unspecified stairs and steps, initial encounter: Secondary | ICD-10-CM | POA: Diagnosis not present

## 2017-12-07 DIAGNOSIS — K8020 Calculus of gallbladder without cholecystitis without obstruction: Secondary | ICD-10-CM

## 2017-12-07 DIAGNOSIS — K219 Gastro-esophageal reflux disease without esophagitis: Secondary | ICD-10-CM | POA: Diagnosis present

## 2017-12-07 DIAGNOSIS — E669 Obesity, unspecified: Secondary | ICD-10-CM | POA: Diagnosis present

## 2017-12-07 DIAGNOSIS — Z8249 Family history of ischemic heart disease and other diseases of the circulatory system: Secondary | ICD-10-CM

## 2017-12-07 DIAGNOSIS — Z992 Dependence on renal dialysis: Secondary | ICD-10-CM

## 2017-12-07 DIAGNOSIS — Z889 Allergy status to unspecified drugs, medicaments and biological substances status: Secondary | ICD-10-CM

## 2017-12-07 DIAGNOSIS — Z79899 Other long term (current) drug therapy: Secondary | ICD-10-CM

## 2017-12-07 LAB — I-STAT CG4 LACTIC ACID, ED
Lactic Acid, Venous: 2.62 mmol/L (ref 0.5–1.9)
Lactic Acid, Venous: 2.75 mmol/L (ref 0.5–1.9)

## 2017-12-07 LAB — CREATININE, SERUM
Creatinine, Ser: 22.9 mg/dL — ABNORMAL HIGH (ref 0.44–1.00)
GFR calc Af Amer: 2 mL/min — ABNORMAL LOW (ref >60)
GFR calc non Af Amer: 2 mL/min — ABNORMAL LOW (ref >60)

## 2017-12-07 LAB — CBC
HCT: 27.7 % — ABNORMAL LOW (ref 36.0–46.0)
Hemoglobin: 8.3 g/dL — ABNORMAL LOW (ref 12.0–15.0)
MCH: 24.4 pg — ABNORMAL LOW (ref 26.0–34.0)
MCHC: 30 g/dL (ref 30.0–36.0)
MCV: 81.5 fL (ref 78.0–100.0)
Platelets: 235 10*3/uL (ref 150–400)
RBC: 3.4 MIL/uL — ABNORMAL LOW (ref 3.87–5.11)
RDW: 22.5 % — ABNORMAL HIGH (ref 11.5–15.5)
WBC: 8.1 10*3/uL (ref 4.0–10.5)

## 2017-12-07 LAB — CBC WITH DIFFERENTIAL/PLATELET
BASOS ABS: 0 10*3/uL (ref 0.0–0.1)
BASOS PCT: 0 %
EOS ABS: 0.1 10*3/uL (ref 0.0–0.7)
Eosinophils Relative: 1 %
HCT: 28.6 % — ABNORMAL LOW (ref 36.0–46.0)
Hemoglobin: 8.6 g/dL — ABNORMAL LOW (ref 12.0–15.0)
Lymphocytes Relative: 13 %
Lymphs Abs: 1.2 10*3/uL (ref 0.7–4.0)
MCH: 24.6 pg — AB (ref 26.0–34.0)
MCHC: 30.1 g/dL (ref 30.0–36.0)
MCV: 81.7 fL (ref 78.0–100.0)
MONO ABS: 0.9 10*3/uL (ref 0.1–1.0)
Monocytes Relative: 9 %
NEUTROS ABS: 7.3 10*3/uL (ref 1.7–7.7)
NEUTROS PCT: 77 %
PLATELETS: 223 10*3/uL (ref 150–400)
RBC: 3.5 MIL/uL — ABNORMAL LOW (ref 3.87–5.11)
RDW: 22.3 % — AB (ref 11.5–15.5)
WBC: 9.5 10*3/uL (ref 4.0–10.5)

## 2017-12-07 LAB — COMPREHENSIVE METABOLIC PANEL
ALK PHOS: 41 U/L (ref 38–126)
ALT: 6 U/L (ref 0–44)
AST: 14 U/L — ABNORMAL LOW (ref 15–41)
Albumin: 3.1 g/dL — ABNORMAL LOW (ref 3.5–5.0)
Anion gap: 20 — ABNORMAL HIGH (ref 5–15)
BILIRUBIN TOTAL: 0.6 mg/dL (ref 0.3–1.2)
BUN: 98 mg/dL — ABNORMAL HIGH (ref 6–20)
CALCIUM: 9.4 mg/dL (ref 8.9–10.3)
CO2: 21 mmol/L — AB (ref 22–32)
Chloride: 97 mmol/L — ABNORMAL LOW (ref 98–111)
Creatinine, Ser: 22.75 mg/dL — ABNORMAL HIGH (ref 0.44–1.00)
GFR calc non Af Amer: 2 mL/min — ABNORMAL LOW (ref >60)
GFR, EST AFRICAN AMERICAN: 2 mL/min — AB (ref >60)
Glucose, Bld: 98 mg/dL (ref 70–99)
Potassium: 5.6 mmol/L — ABNORMAL HIGH (ref 3.5–5.1)
SODIUM: 138 mmol/L (ref 135–145)
Total Protein: 7 g/dL (ref 6.5–8.1)

## 2017-12-07 LAB — MAGNESIUM: Magnesium: 2.3 mg/dL (ref 1.7–2.4)

## 2017-12-07 LAB — HCG, QUANTITATIVE, PREGNANCY: hCG, Beta Chain, Quant, S: 3 m[IU]/mL (ref <5)

## 2017-12-07 LAB — I-STAT BETA HCG BLOOD, ED (MC, WL, AP ONLY): I-stat hCG, quantitative: 6.2 m[IU]/mL — ABNORMAL HIGH (ref <5)

## 2017-12-07 LAB — PHOSPHORUS: Phosphorus: 7.3 mg/dL — ABNORMAL HIGH (ref 2.5–4.6)

## 2017-12-07 MED ORDER — DOXERCALCIFEROL 4 MCG/2ML IV SOLN
3.0000 ug | INTRAVENOUS | Status: DC
  Filled 2017-12-07: qty 2

## 2017-12-07 MED ORDER — PIPERACILLIN-TAZOBACTAM 3.375 G IVPB 30 MIN: 3.3750 g | Freq: Once | INTRAVENOUS | Status: DC

## 2017-12-07 MED ORDER — VANCOMYCIN HCL 10 G IV SOLR
1750.0000 mg | INTRAVENOUS | Status: AC
  Administered 2017-12-07: 1750 mg via INTRAVENOUS
  Filled 2017-12-07: qty 1750

## 2017-12-07 MED ORDER — DOXERCALCIFEROL 4 MCG/2ML IV SOLN
INTRAVENOUS | Status: AC
  Administered 2017-12-07: 3 ug
  Filled 2017-12-07: qty 2

## 2017-12-07 MED ORDER — SEVELAMER CARBONATE 800 MG PO TABS
2400.0000 mg | ORAL_TABLET | Freq: Three times a day (TID) | ORAL | Status: DC
  Administered 2017-12-08: 2400 mg via ORAL
  Filled 2017-12-07 (×4): qty 3

## 2017-12-07 MED ORDER — LIDOCAINE HCL (PF) 1 % IJ SOLN: 5.0000 mL | INTRAMUSCULAR | Status: DC | PRN

## 2017-12-07 MED ORDER — OXYCODONE-ACETAMINOPHEN 5-325 MG PO TABS: 2.0000 | ORAL_TABLET | Freq: Once | ORAL | Status: DC

## 2017-12-07 MED ORDER — ACETAMINOPHEN 325 MG PO TABS
650.0000 mg | ORAL_TABLET | Freq: Once | ORAL | Status: AC | PRN
  Administered 2017-12-07: 650 mg via ORAL
  Filled 2017-12-07: qty 2

## 2017-12-07 MED ORDER — SODIUM CHLORIDE 0.9 % IV SOLN
1.0000 g | Freq: Once | INTRAVENOUS | Status: DC
  Administered 2017-12-07: 1 g via INTRAVENOUS
  Filled 2017-12-07: qty 1

## 2017-12-07 MED ORDER — VANCOMYCIN HCL 10 G IV SOLR
1750.0000 mg | Freq: Once | INTRAVENOUS | Status: DC
  Filled 2017-12-07: qty 1750

## 2017-12-07 MED ORDER — ALBUTEROL SULFATE (2.5 MG/3ML) 0.083% IN NEBU
5.0000 mg | INHALATION_SOLUTION | Freq: Once | RESPIRATORY_TRACT | Status: AC
  Administered 2017-12-07: 5 mg via RESPIRATORY_TRACT
  Filled 2017-12-07: qty 6

## 2017-12-07 MED ORDER — ONDANSETRON HCL 4 MG/2ML IJ SOLN
4.0000 mg | Freq: Four times a day (QID) | INTRAMUSCULAR | Status: DC | PRN
  Administered 2017-12-07: 4 mg via INTRAVENOUS
  Filled 2017-12-07: qty 2

## 2017-12-07 MED ORDER — SODIUM CHLORIDE 0.9 % IV SOLN
2.0000 g | Freq: Once | INTRAVENOUS | Status: DC
  Filled 2017-12-07: qty 2

## 2017-12-07 MED ORDER — VANCOMYCIN HCL IN DEXTROSE 1-5 GM/200ML-% IV SOLN
1000.0000 mg | INTRAVENOUS | Status: DC
  Administered 2017-12-09: 1000 mg via INTRAVENOUS
  Filled 2017-12-07: qty 200

## 2017-12-07 MED ORDER — SODIUM CHLORIDE 0.9 % IV SOLN: 100.0000 mL | INTRAVENOUS | Status: DC | PRN

## 2017-12-07 MED ORDER — VANCOMYCIN HCL IN DEXTROSE 1-5 GM/200ML-% IV SOLN: 1000.0000 mg | Freq: Once | INTRAVENOUS | Status: DC

## 2017-12-07 MED ORDER — HEPARIN SODIUM (PORCINE) 1000 UNIT/ML DIALYSIS: 6000.0000 [IU] | INTRAMUSCULAR | Status: DC | PRN

## 2017-12-07 MED ORDER — BARIUM SULFATE 2.1 % PO SUSP
ORAL | Status: AC
  Filled 2017-12-07: qty 2

## 2017-12-07 MED ORDER — SODIUM CHLORIDE 0.9 % IV SOLN
1.0000 g | Freq: Once | INTRAVENOUS | Status: AC
  Filled 2017-12-07: qty 1

## 2017-12-07 MED ORDER — CINACALCET HCL 30 MG PO TABS
60.0000 mg | ORAL_TABLET | Freq: Every evening | ORAL | Status: DC
  Administered 2017-12-07 – 2017-12-08 (×2): 60 mg via ORAL
  Filled 2017-12-07 (×2): qty 2

## 2017-12-07 MED ORDER — HEPARIN SODIUM (PORCINE) 1000 UNIT/ML DIALYSIS: 1000.0000 [IU] | INTRAMUSCULAR | Status: DC | PRN

## 2017-12-07 MED ORDER — HYDROCODONE-ACETAMINOPHEN 5-325 MG PO TABS
1.0000 | ORAL_TABLET | Freq: Four times a day (QID) | ORAL | Status: DC | PRN
  Administered 2017-12-07 – 2017-12-08 (×3): 1 via ORAL
  Filled 2017-12-07 (×4): qty 1

## 2017-12-07 MED ORDER — ALBUTEROL SULFATE (2.5 MG/3ML) 0.083% IN NEBU: 2.5000 mg | INHALATION_SOLUTION | RESPIRATORY_TRACT | Status: DC | PRN

## 2017-12-07 MED ORDER — IOPAMIDOL (ISOVUE-300) INJECTION 61%
INTRAVENOUS | Status: AC
  Filled 2017-12-07: qty 30

## 2017-12-07 MED ORDER — SODIUM BICARBONATE 650 MG PO TABS: 325.0000 mg | ORAL_TABLET | Freq: Two times a day (BID) | ORAL | Status: DC

## 2017-12-07 MED ORDER — CALCIUM ACETATE (PHOS BINDER) 667 MG PO CAPS
1334.0000 mg | ORAL_CAPSULE | Freq: Three times a day (TID) | ORAL | Status: DC
  Administered 2017-12-08: 1334 mg via ORAL
  Filled 2017-12-07 (×4): qty 2

## 2017-12-07 MED ORDER — PENTAFLUOROPROP-TETRAFLUOROETH EX AERO: 1.0000 "application " | INHALATION_SPRAY | CUTANEOUS | Status: DC | PRN

## 2017-12-07 MED ORDER — OXYCODONE HCL 5 MG PO TABS
10.0000 mg | ORAL_TABLET | Freq: Once | ORAL | Status: AC
  Administered 2017-12-07: 10 mg via ORAL
  Filled 2017-12-07: qty 2

## 2017-12-07 MED ORDER — SODIUM CHLORIDE 0.9 % IV SOLN: 1.0000 g | INTRAVENOUS | Status: DC

## 2017-12-07 MED ORDER — ACETAMINOPHEN 325 MG PO TABS
650.0000 mg | ORAL_TABLET | Freq: Four times a day (QID) | ORAL | Status: DC | PRN
  Administered 2017-12-07: 650 mg via ORAL
  Filled 2017-12-07: qty 2

## 2017-12-07 MED ORDER — ACETAMINOPHEN 325 MG PO TABS
325.0000 mg | ORAL_TABLET | Freq: Once | ORAL | Status: AC
  Administered 2017-12-07: 325 mg via ORAL
  Filled 2017-12-07: qty 1

## 2017-12-07 MED ORDER — HEPARIN SODIUM (PORCINE) 5000 UNIT/ML IJ SOLN
5000.0000 [IU] | Freq: Two times a day (BID) | INTRAMUSCULAR | Status: DC
  Administered 2017-12-07 (×2): 5000 [IU] via SUBCUTANEOUS
  Filled 2017-12-07 (×2): qty 1

## 2017-12-07 MED ORDER — FAMOTIDINE 20 MG PO TABS
20.0000 mg | ORAL_TABLET | Freq: Two times a day (BID) | ORAL | Status: DC
  Administered 2017-12-07 – 2017-12-08 (×3): 20 mg via ORAL
  Filled 2017-12-07 (×5): qty 1

## 2017-12-07 MED ORDER — LIDOCAINE-PRILOCAINE 2.5-2.5 % EX CREA: 1.0000 "application " | TOPICAL_CREAM | CUTANEOUS | Status: DC | PRN

## 2017-12-07 NOTE — ED Triage Notes (Signed)
Pt arrives via EMS with c/o [facility] since 12/06/17; Patient has dialysis on T,Th,St; patient did not receive dialysis on this past Saturday due to a mechanical fall resulting in exacubation of chronic body pains but no other injuries noted; Pt c/o of generalized soreness to include chest, legs and back;Patient has had n/d due to medication changes per EMS; pt left arm restricted due to shunt-Monique,RN

## 2017-12-07 NOTE — ED Notes (Signed)
Patient transported to X-ray 

## 2017-12-07 NOTE — Progress Notes (Addendum)
Pharmacy Antibiotic Note  Desiree [name] is a 42 y.o. female admitted on 12/07/2017 with back pain due to a mechanical fall. Additionally she states she has been having multiple episodes of diarrhea and reports a fever. She is ESRD on HD-TuThSat. Empiric antibiotics are being initiated for what appears to be fever of unknown origin. Mildly elevated LA   Plan: -Vancomycin 1750 mg IV x1 then 1 g IV qHD-TuThSat -Cefepime 1 g IV q24h -Monitor cultures, HD schedule and tolerance -Obtain VR as indicated    Height: 5\' 5"  (165.1 cm) Weight: 190 lb 11.2 oz (86.5 kg) IBW/kg (Calculated) : 57  Temp (24hrs), Avg:100.8 F (38.2 C), Min:100.4 F (38 C), Max:101.2 F (38.4 C)  Recent Labs  Lab 12/07/17 0335 12/07/17 0452 12/07/17 0503  WBC  --  9.5  --   CREATININE  --  22.75*  --   LATICACIDVEN 2.62*  --  2.75*      Antimicrobials this admission: 9/24 cefepime > 9/24 vancomycin >  Dose adjustments this admission: N/A  Microbiology results: 9/24 blood cx: 9/24 GI PCR:  Desiree [name] 12/07/2017 8:50 AM

## 2017-12-07 NOTE — ED Notes (Signed)
Patient does not have a dialysis graft

## 2017-12-07 NOTE — ED Notes (Signed)
Patient returned from xray.

## 2017-12-07 NOTE — ED Notes (Signed)
ED Provider at bedside. 

## 2017-12-07 NOTE — H&P (Signed)
History and Physical  Desiree [name] IHK:[id number] DOB: [birth year] DOA: 12/07/2017  Referring physician: ER physician PCP: Harvie Junior, MD  Outpatient Specialists: Nephrologist, Dr. Owens Shark. Patient coming from: Home  Chief Complaint: Fever for 2 days, and diarrhea for 2 to 3 weeks.  HPI:  Patient is a 42 year old African-American female, obese, with past medical history significant for end-stage renal disease on hemodialysis for over 10 years, asthma, GERD, gout, hypertension, migraine and seizure.  According to the patient, the cause of end-stage renal disease was nephropathy.  Patient could not specify the particular type of nephropathy.  According to the patient, she still makes some urine.  Patient developed diarrhea about 2 to 3 weeks ago.  Stool is said to be watery, nonbloody, with no sick contacts.  Patient reported having bowel movement several times a day.  Patient has not had bowel movement since she has been in the emergency room.  Patient reported having developed fever 2 days ago.  Temperature  of 103 F was reported.  On presentation to the hospital, temperature of 101.2 F was documented.  Patient is dialyzed via AV fistula.  According to the patient, she had a balloon angioplasty of the AV fistula about 3 to 4 weeks ago.  No coughing, no phlegms and no neck pain.  Patient reports having a headache following a fall at home.  No chest pain.  Shortness of breath was initially reported but responded to inhalers.  Systolic blood pressure of 180 mmHg was documented on presentation, but down to 130 mmHg.  Lactic acid level of 2.75 was noted.  ER physician discussed with nephrology team and decision was made for patient to be admitted by the hospitalist team.  ED Course: Blood has been drawn for blood cultures.  Vancomycin and Zosyn ordered by the ER provider.  Nephrology has been consulted.  Hospitalist has been asked to admit patient.   Pertinent labs: CBC revealed WBC of 9.5,  hemoglobin of 8.6, platelet count of 223, MCV of 81.7.  Chemistry reveals sodium of 138, potassium of 5.6, chloride of 97, CO2 21, BUN of 98 and creatinine of 22.75 with blood sugar of 98.  CT head has not revealed any acute abnormality.  Chest x-ray said to reveal mild to moderate pulmonary edema.  X-ray of the lumbar spine is said not to reveal any acute fracture or dislocation.  Stable mild loss of L5-S1 intervertebral disc space height was reported EKG: Independently reviewed.  Imaging: independently reviewed.   Review of Systems: Negative for visual changes, sore throat, rash, new muscle aches, chest pain, dysuria, bleeding, abdominal pain.  Past Medical History:  Diagnosis Date  . Childhood asthma   . Complication of anesthesia    "sometimes it does not work; didn't during [place]" (01/21/2016)  . ESRD (end stage renal disease) on dialysis ([place])    "TTS; [facility]; [facility]" (08/03/2016)  . GERD (gastroesophageal reflux disease)    nexium prn  . Gout   . History of blood transfusion    "related to kidneys; I've had 4" (01/21/2016)  . Hypertension   . Migraine    "qod now" (01/21/2016)  . Preterm labor ~ 2014  . Renal insufficiency   . Seizures ([place])    "last one was in 2000; related to preeclampsia" (01/21/2016)    Past Surgical History:  Procedure Laterality Date  . AV FISTULA PLACEMENT Left 2010  . CERVICAL BIOPSY  W/ LOOP ELECTRODE EXCISION  2001  . DILATION AND EVACUATION  08/02/2011   Procedure: DILATATION AND EVACUATION;  Surgeon: Logan Bores, MD;  Location: [facility];  Service: Gynecology;;  . Brigitte Pulse AND EVACUATION N/A 08/31/2013   Procedure: DILATATION AND EVACUATION;  Surgeon: Woodroe Mode, MD;  Location: [facility];  Service: Gynecology;  Laterality: N/A;  . RENAL BIOPSY    . REVISION OF ARTERIOVENOUS GORETEX GRAFT Left 02/11/2013   Procedure: REVISION OF ARTERIOVENOUS GORTEX FISTULA;  Surgeon: Rosetta Posner, MD;  Location: [facility];  Service: Vascular;   Laterality: Left;     reports that she is a non-smoker but has been exposed to tobacco smoke. She has never used smokeless tobacco. She reports that she has current or past drug history. Drug: Marijuana. She reports that she does not drink alcohol.  Allergies  Allergen Reactions  . Morphine Shortness Of Breath and Anaphylaxis  . Prednisone Other (See Comments)    Other reaction(s): Other (See Comments) Muscle spasms Patient says prednisone causes her to cramp all over, muscle spasms uncontrolled  . Geralyn Flash [Fish Allergy] Itching, Swelling, Rash and Other (See Comments)    Face droops also  . Iodinated Diagnostic Agents Itching  . Tape Itching    Family History  Problem Relation Age of Onset  . Diabetes Mother   . Hyperlipidemia Mother   . Hypertension Mother   . Heart disease Mother   . Hypertension Unknown   . Diabetes type II Unknown      Prior to Admission medications   Medication Sig Start Date End Date Taking? Authorizing Provider  albuterol (PROVENTIL HFA;VENTOLIN HFA) 108 (90 Base) MCG/ACT inhaler Inhale 2 puffs into the lungs every 4 (four) hours as needed for wheezing or shortness of breath. 05/02/16  Yes Forde Dandy, MD  calcium acetate (PHOSLO) 667 MG capsule Take 1,334 mg by mouth 3 (three) times daily with meals. 11/30/17  Yes [provider]  cinacalcet (SENSIPAR) 30 MG tablet Take 60 mg by mouth every evening.    Yes [provider]  diphenhydrAMINE (BENADRYL) 25 MG tablet Take 1 tablet (25 mg total) by mouth every 6 (six) hours. 09/05/17  Yes Domenic Moras, PA-C  famotidine (PEPCID) 20 MG tablet Take 1 tablet (20 mg total) by mouth 2 (two) times daily. 09/05/17  Yes Domenic Moras, PA-C  Oxycodone HCl 10 MG TABS Take 1 tablet (10 mg total) by mouth daily. 08/04/16  Yes Asencion Partridge, MD  sevelamer carbonate (RENVELA) 800 MG tablet Take 2,400 mg by mouth 3 (three) times daily with meals.   Yes [provider]  sodium bicarbonate 325 MG tablet Take  325 mg by mouth 2 (two) times daily.   Yes [provider]    Physical Exam: Vitals:   12/07/17 0630 12/07/17 0700 12/07/17 0730 12/07/17 0800  BP: (!) 141/78 139/79 137/83 137/86  Pulse: 98 97 93 86  Resp: (!) 31 (!) 34 (!) 29 (!) 24  Temp:      TempSrc:      SpO2: 100% 96% 100% 100%  Weight:      Height:        Constitutional:  . Appears calm and comfortable.  Obese. Eyes:  . Pallor. No jaundice.  ENMT:  . external ears, nose appear normal.  Dry buccal mucosa Neck:  . Neck is supple. No JVD Respiratory:  . CTA bilaterally, no w/r/r.  . Respiratory effort normal. No retractions or accessory muscle use Cardiovascular:  . S1S2 . No LE extremity edema   Abdomen:  . Abdomen is  obese, soft and non tender. Organs are difficult to assess. Neurologic:  . Awake and alert. . Moves all limbs.  Wt Readings from Last 3 Encounters:  12/07/17 86.5 kg  05/26/17 93 kg  05/18/17 91.5 kg    I have personally reviewed following labs and imaging studies  Labs on Admission:  CBC: Recent Labs  Lab 12/07/17 0452  WBC 9.5  NEUTROABS 7.3  HGB 8.6*  HCT 28.6*  MCV 81.7  PLT 387   Basic Metabolic Panel: Recent Labs  Lab 12/07/17 0452  NA 138  K 5.6*  CL 97*  CO2 21*  GLUCOSE 98  BUN 98*  CREATININE 22.75*  CALCIUM 9.4   Liver Function Tests: Recent Labs  Lab 12/07/17 0452  AST 14*  ALT 6  ALKPHOS 41  BILITOT 0.6  PROT 7.0  ALBUMIN 3.1*   No results for input(s): LIPASE, AMYLASE in the last 168 hours. No results for input(s): AMMONIA in the last 168 hours. Coagulation Profile: No results for input(s): INR, PROTIME in the last 168 hours. Cardiac Enzymes: No results for input(s): CKTOTAL, CKMB, CKMBINDEX, TROPONINI in the last 168 hours. BNP (last 3 results) No results for input(s): PROBNP in the last 8760 hours. HbA1C: No results for input(s): HGBA1C in the last 72 hours. CBG: No results for input(s): GLUCAP in the last 168 hours. Lipid  Profile: No results for input(s): CHOL, HDL, LDLCALC, TRIG, CHOLHDL, LDLDIRECT in the last 72 hours. Thyroid Function Tests: No results for input(s): TSH, T4TOTAL, FREET4, T3FREE, THYROIDAB in the last 72 hours. Anemia Panel: No results for input(s): VITAMINB12, FOLATE, FERRITIN, TIBC, IRON, RETICCTPCT in the last 72 hours. Urine analysis:    Component Value Date/Time   COLORURINE YELLOW 12/20/2013 0530   APPEARANCEUR CLOUDY (A) 12/20/2013 0530   LABSPEC 1.009 12/20/2013 0530   PHURINE 8.0 12/20/2013 0530   GLUCOSEU 100 (A) 12/20/2013 0530   HGBUR LARGE (A) 12/20/2013 0530   BILIRUBINUR NEGATIVE 12/20/2013 0530   KETONESUR NEGATIVE 12/20/2013 0530   PROTEINUR 100 (A) 12/20/2013 0530   UROBILINOGEN 0.2 12/20/2013 0530   NITRITE NEGATIVE 12/20/2013 0530   LEUKOCYTESUR TRACE (A) 12/20/2013 0530   Sepsis Labs: @LABRCNTIP (procalcitonin:4,lacticidven:4) )No results found for this or any previous visit (from the past 240 hour(s)).    Radiological Exams on Admission: Dg Chest 2 View  Result Date: 12/07/2017 CLINICAL DATA:  Dyspnea EXAM: CHEST - 2 VIEW COMPARISON:  08/28/2017 chest radiograph. FINDINGS: Stable cardiomediastinal silhouette with top normal heart size. No pneumothorax. No pleural effusion. Diffuse hazy parahilar lung opacities. IMPRESSION: Diffuse hazy parahilar lung opacities, suggesting mild-to-moderate pulmonary edema. Electronically Signed   By: Ilona Sorrel M.D.   On: 12/07/2017 02:56   Dg Lumbar Spine Complete  Result Date: 12/07/2017 CLINICAL DATA:  42 y/o F; fall down stairs 12/04/2017. Lower back pain for 4 days. EXAM: LUMBAR SPINE - COMPLETE 4+ VIEW COMPARISON:  03/29/2015 lumbar spine radiographs. FINDINGS: There is no evidence of lumbar spine fracture. Alignment is normal. Mild stable loss of L5-S1 intervertebral disc space height. IMPRESSION: No acute fracture or dislocation identified. Stable mild loss of L5-S1 intervertebral disc space height. Electronically  Signed   By: Kristine Garbe M.D.   On: 12/07/2017 05:31   Ct Head Wo Contrast  Result Date: 12/07/2017 CLINICAL DATA:  42 y/o  F; headache, nausea, vomiting. EXAM: CT HEAD WITHOUT CONTRAST TECHNIQUE: Contiguous axial images were obtained from the base of the skull through the vertex without intravenous contrast. COMPARISON:  06/07/2016 CT head FINDINGS:  Brain: No evidence of acute infarction, hemorrhage, hydrocephalus, extra-axial collection or mass lesion/mass effect. Vascular: No hyperdense vessel or unexpected calcification. Skull: Normal. Negative for fracture or focal lesion. Sinuses/Orbits: No acute finding. Other: None. IMPRESSION: Stable normal CT of head. Electronically Signed   By: Kristine Garbe M.D.   On: 12/07/2017 05:44    EKG: Independently reviewed.   Active Problems:   Fever   Assessment/Plan Fever: Definitive source unclear. Recent balloon angioplasty of the patient's fistula noted. Patient has also had diarrhea for about 2 to 3 weeks Follow blood cultures. Stool analysis IV antibiotics Further management depend on hospital course.  Diarrhea: Stool analysis Manage supportively. Further management will depend on above  ESRD on HD TTS: Missed last HD CXR reveals pulmonary edema Mild Hyperkalemia noted ?Compliance with HD Hemoglobin not at goal Check Mg and phos Nephrology consulted.  Mild Hyperkalemia: Likely Hemodialysis today Hyperkalemia should resolve with HD  Acidosis:  Elevated lactic acidosis noted CO2 of 21 noted Continue sodium bicarb Follow lactic acid level  Asthma: Stable.   DVT prophylaxis:Subcut Heparin Code Status: Full code Family Communication:  Disposition Plan: Home eventually   Consults called: ER has consulted Nephrology   Admission status: Observation    Time spent: 65 minutes.  Dana Allan, MD  [facility] Pager #: [phone] 7PM-7AM contact night coverage as above  12/07/2017,  8:43 AM

## 2017-12-07 NOTE — Consult Note (Addendum)
[facility] Renal Consultation Note    Indication for Consultation:  Management of ESRD/hemodialysis; anemia, hypertension/volume and secondary hyperparathyroidism  HPI: Desiree [name] is a 42 y.o. female with ESRD on HD TTS at [facility]. On HD since 08/2008. PMH HTN, seizure disorder.  Admitted for evaluation of fever with diarrhea. She reports 2 week history of runny diarrhea. On Saturday, after going to the bathroom, she tripped and fell down a flight of stairs. She did hit her head but did not lose consciousness. She missed dialysis on Saturday because she felt so bad. She tried to manage at home, but presented to the ED this am when she started having trouble breathing. She also reports fever and chills at home on Sunday with temp 1042F.   Pertinent ED findings: Temp 101.42F.O2sats 100% on RA. BP 163/85. Head CT negative for acute findings. No fracture on lumbar xray. CXR showing moderate pulmonary edema. Labs: K 5.6, Cr 22.75  BUN 98. WBC 9.5, Hgb 8.6, Lactic acid 2.75. Blood cultures collected. Empiric antibiotics started in ED.   Last dialysis was 9/19. Left 3kg over EDW. Frequently cuts treatments short. Dialyzes via LUE AVF using buttonholes.   Past Medical History:  Diagnosis Date  . Childhood asthma   . Complication of anesthesia    "sometimes it does not work; didn't during [place]" (01/21/2016)  . ESRD (end stage renal disease) on dialysis ([place])    "TTS; [facility]; [facility]" (08/03/2016)  . GERD (gastroesophageal reflux disease)    nexium prn  . Gout   . History of blood transfusion    "related to kidneys; I've had 4" (01/21/2016)  . Hypertension   . Migraine    "qod now" (01/21/2016)  . Preterm labor ~ 2014  . Renal insufficiency   . Seizures ([place])    "last one was in 2000; related to preeclampsia" (01/21/2016)   Past Surgical History:  Procedure Laterality Date  . AV FISTULA PLACEMENT Left 2010  . CERVICAL BIOPSY  W/ LOOP  ELECTRODE EXCISION  2001  . DILATION AND EVACUATION  08/02/2011   Procedure: DILATATION AND EVACUATION;  Surgeon: Logan Bores, MD;  Location: [facility];  Service: Gynecology;;  . Brigitte Pulse AND EVACUATION N/A 08/31/2013   Procedure: DILATATION AND EVACUATION;  Surgeon: Woodroe Mode, MD;  Location: [facility];  Service: Gynecology;  Laterality: N/A;  . RENAL BIOPSY    . REVISION OF ARTERIOVENOUS GORETEX GRAFT Left 02/11/2013   Procedure: REVISION OF ARTERIOVENOUS GORTEX FISTULA;  Surgeon: Rosetta Posner, MD;  Location: [facility];  Service: Vascular;  Laterality: Left;   Family History  Problem Relation Age of Onset  . Diabetes Mother   . Hyperlipidemia Mother   . Hypertension Mother   . Heart disease Mother   . Hypertension Unknown   . Diabetes type II Unknown    Social History:  reports that she is a non-smoker but has been exposed to tobacco smoke. She has never used smokeless tobacco. She reports that she has current or past drug history. Drug: Marijuana. She reports that she does not drink alcohol. Allergies  Allergen Reactions  . Morphine Shortness Of Breath and Anaphylaxis  . Prednisone Other (See Comments)    Other reaction(s): Other (See Comments) Muscle spasms Patient says prednisone causes her to cramp all over, muscle spasms uncontrolled  . Geralyn Flash [Fish Allergy] Itching, Swelling, Rash and Other (See Comments)    Face droops also  . Iodinated Diagnostic Agents Itching  . Tape  Itching   Prior to Admission medications   Medication Sig Start Date End Date Taking? Authorizing Provider  albuterol (PROVENTIL HFA;VENTOLIN HFA) 108 (90 Base) MCG/ACT inhaler Inhale 2 puffs into the lungs every 4 (four) hours as needed for wheezing or shortness of breath. 05/02/16  Yes Forde Dandy, MD  calcium acetate (PHOSLO) 667 MG capsule Take 1,334 mg by mouth 3 (three) times daily with meals. 11/30/17  Yes [provider]  cinacalcet (SENSIPAR) 30 MG tablet Take 60 mg by mouth every evening.     Yes [provider]  diphenhydrAMINE (BENADRYL) 25 MG tablet Take 1 tablet (25 mg total) by mouth every 6 (six) hours. 09/05/17  Yes Domenic Moras, PA-C  famotidine (PEPCID) 20 MG tablet Take 1 tablet (20 mg total) by mouth 2 (two) times daily. 09/05/17  Yes Domenic Moras, PA-C  Oxycodone HCl 10 MG TABS Take 1 tablet (10 mg total) by mouth daily. 08/04/16  Yes Asencion Partridge, MD  sevelamer carbonate (RENVELA) 800 MG tablet Take 2,400 mg by mouth 3 (three) times daily with meals.   Yes [provider]  sodium bicarbonate 325 MG tablet Take 325 mg by mouth 2 (two) times daily.   Yes [provider]   Current Facility-Administered Medications  Medication Dose Route Frequency Provider Last Rate Last Dose  . ceFEPIme (MAXIPIME) 1 g in sodium chloride 0.9 % 100 mL IVPB  1 g Intravenous Once [name], [facility]      . [START ON 12/08/2017] ceFEPIme (MAXIPIME) 1 g in sodium chloride 0.9 % 100 mL IVPB  1 g Intravenous Q24H [name], RPH      . vancomycin (VANCOCIN) 1,750 mg in sodium chloride 0.9 % 500 mL IVPB  1,750 mg Intravenous Once [name], [facility]      . [START ON 12/09/2017] vancomycin (VANCOCIN) IVPB 1000 mg/200 mL premix  1,000 mg Intravenous Q T,Th,Sa-HD Masters, Jake Church, [facility]       Current Outpatient Medications  Medication Sig Dispense Refill  . albuterol (PROVENTIL HFA;VENTOLIN HFA) 108 (90 Base) MCG/ACT inhaler Inhale 2 puffs into the lungs every 4 (four) hours as needed for wheezing or shortness of breath. 1 Inhaler 2  . calcium acetate (PHOSLO) 667 MG capsule Take 1,334 mg by mouth 3 (three) times daily with meals.  4  . cinacalcet (SENSIPAR) 30 MG tablet Take 60 mg by mouth every evening.     . diphenhydrAMINE (BENADRYL) 25 MG tablet Take 1 tablet (25 mg total) by mouth every 6 (six) hours. 20 tablet 0  . famotidine (PEPCID) 20 MG tablet Take 1 tablet (20 mg total) by mouth 2 (two) times daily. 10 tablet 0  . Oxycodone HCl 10 MG TABS Take 1 tablet (10  mg total) by mouth daily. 5 tablet 0  . sevelamer carbonate (RENVELA) 800 MG tablet Take 2,400 mg by mouth 3 (three) times daily with meals.    . sodium bicarbonate 325 MG tablet Take 325 mg by mouth 2 (two) times daily.      ROS: As per HPI otherwise negative.  Physical Exam: Vitals:   12/07/17 0700 12/07/17 0730 12/07/17 0800 12/07/17 0900  BP: 139/79 137/83 137/86 (!) 152/91  Pulse: 97 93 86 82  Resp: (!) 34 (!) 29 (!) 24 (!) 22  Temp:      TempSrc:      SpO2: 96% 100% 100% 100%  Weight:      Height:  General: WDWN female NAD  Head: NCAT sclera not icteric MMM Neck: Supple. No JVD No masses Lungs: CTA bilaterally without wheezes, rales, or rhonchi. Breathing is unlabored. Heart: RRR with S1 S2 Abdomen: soft NT + BS Lower extremities:without edema or ischemic changes, no open wounds  Neuro: A & O  X 3. Moves all extremities spontaneously. Psych:  Responds to questions appropriately with a normal affect. Dialysis Access: LUE AVF +bruit   Labs: Basic Metabolic Panel: Recent Labs  Lab 12/07/17 0452  NA 138  K 5.6*  CL 97*  CO2 21*  GLUCOSE 98  BUN 98*  CREATININE 22.75*  CALCIUM 9.4   Liver Function Tests: Recent Labs  Lab 12/07/17 0452  AST 14*  ALT 6  ALKPHOS 41  BILITOT 0.6  PROT 7.0  ALBUMIN 3.1*   No results for input(s): LIPASE, AMYLASE in the last 168 hours. No results for input(s): AMMONIA in the last 168 hours. CBC: Recent Labs  Lab 12/07/17 0452  WBC 9.5  NEUTROABS 7.3  HGB 8.6*  HCT 28.6*  MCV 81.7  PLT 223   Cardiac Enzymes: No results for input(s): CKTOTAL, CKMB, CKMBINDEX, TROPONINI in the last 168 hours. CBG: No results for input(s): GLUCAP in the last 168 hours. Iron Studies: No results for input(s): IRON, TIBC, TRANSFERRIN, FERRITIN in the last 72 hours. Studies/Results: Dg Chest 2 View  Result Date: 12/07/2017 CLINICAL DATA:  Dyspnea EXAM: CHEST - 2 VIEW COMPARISON:  08/28/2017 chest radiograph. FINDINGS: Stable  cardiomediastinal silhouette with top normal heart size. No pneumothorax. No pleural effusion. Diffuse hazy parahilar lung opacities. IMPRESSION: Diffuse hazy parahilar lung opacities, suggesting mild-to-moderate pulmonary edema. Electronically Signed   By: Ilona Sorrel M.D.   On: 12/07/2017 02:56   Dg Lumbar Spine Complete  Result Date: 12/07/2017 CLINICAL DATA:  42 y/o F; fall down stairs 12/04/2017. Lower back pain for 4 days. EXAM: LUMBAR SPINE - COMPLETE 4+ VIEW COMPARISON:  03/29/2015 lumbar spine radiographs. FINDINGS: There is no evidence of lumbar spine fracture. Alignment is normal. Mild stable loss of L5-S1 intervertebral disc space height. IMPRESSION: No acute fracture or dislocation identified. Stable mild loss of L5-S1 intervertebral disc space height. Electronically Signed   By: Kristine Garbe M.D.   On: 12/07/2017 05:31   Ct Head Wo Contrast  Result Date: 12/07/2017 CLINICAL DATA:  42 y/o  F; headache, nausea, vomiting. EXAM: CT HEAD WITHOUT CONTRAST TECHNIQUE: Contiguous axial images were obtained from the base of the skull through the vertex without intravenous contrast. COMPARISON:  06/07/2016 CT head FINDINGS: Brain: No evidence of acute infarction, hemorrhage, hydrocephalus, extra-axial collection or mass lesion/mass effect. Vascular: No hyperdense vessel or unexpected calcification. Skull: Normal. Negative for fracture or focal lesion. Sinuses/Orbits: No acute finding. Other: None. IMPRESSION: Stable normal CT of head. Electronically Signed   By: Kristine Garbe M.D.   On: 12/07/2017 05:44    Dialysis Orders:  AF TThS 3.75h 180NRe 400/800 EDW 86.5kg 2K/2.25  L AVF Heparin 6000 U -Hectorol 71mcg IV TIW -Mircera 225 IV q 2 weeks (last 9/16) -PhosLo 2 tabs qac, -Renvela 3 tabs q ac -Sensipar 60 qd   Assessment/Plan: 1. Fever on admission -- Blood cultures ordered. IV Vanc/Cefepime started. -Self cannulates with buttonholes -AVF does not appear infected   2. Diarrhea - GI panel pending - per primary  3. ESRD -  HD TTS. Missed last treatment. K 5.6 Orders written for HD today on schedule  4. Hypertension/volume  - BP elevated on admission. Should improve with  UF today. Follow. Continue home meds  5. Anemia  - Hgb 8.6 Follow trends. Next ESA dose due 9/30  6. Metabolic bone disease -  Ca ok. Continue VDRA/binders/Sensipar.  7. Nutrition - Renal diet/vitamins   Lynnda Child PA-C [facility] Pager [phone] 12/07/2017, 9:43 AM

## 2017-12-07 NOTE — Progress Notes (Signed)
Patient came up from the ED, antibiotics ordered but held for HD. Advised by pharmacy to give maxipime now and vanc to be given with dialysis. Patient has maxipime running and report was called to HD

## 2017-12-07 NOTE — ED Provider Notes (Addendum)
[facility] EMERGENCY DEPARTMENT Provider Note   CSN: [id number] Arrival date & time: 12/07/17  0128  Time seen 03:15 AM   History   Chief Complaint Chief Complaint  Patient presents with  . Shortness of Breath    HPI Desiree [name] is a 42 y.o. female.  HPI patient states August 21 she fell down the stairs at home, she had gone to the bathroom for diarrhea and when she came out she missed a step and fell down 14 steps.  She states she has low back pain "like somebody shocking me".  She states her pain is worse when she sits up.  Nothing makes it feel better.  She states her right leg is also hurting and it feels weak.  She states she hit her head but did not have loss of consciousness.  She states she started having a headache at 1230 tonight that is holoacranial.  She also complains of left-sided chest pain that started the morning of September 23 that she describes as pressure and aching.  It hurts more when she breathes deeply and it feels better if she lays on her right side.  She is never had that before.  She also states she feels short of breath however an albuterol nebulizer helped that.  She states she is having about 6 episodes of diarrhea since yesterday and she has been having diarrhea for the past 2 to 3 weeks.  She is thinks it is from a new blood pressure pill but she does not know what the pill is.  She states this morning she woke up feeling hot and she started having chills late in the evening on September 22.  She states she was just started on blood pressure medications in May and she has been having intolerance issues for all of them.  She states sometimes she makes urine.  She has been coughing for the past 2 days but denies sore throat.  Patient has end-stage renal disease and goes to dialysis on Tuesday (later today) Thursday, and Saturday.  She missed her appointment on Saturday, August 21 because she fell down the stairs.  PCP Harvie Junior,  MD   Past Medical History:  Diagnosis Date  . Childhood asthma   . Complication of anesthesia    "sometimes it does not work; didn't during [place]" (01/21/2016)  . ESRD (end stage renal disease) on dialysis ([place])    "TTS; [facility]; [facility]" (08/03/2016)  . GERD (gastroesophageal reflux disease)    nexium prn  . Gout   . History of blood transfusion    "related to kidneys; I've had 4" (01/21/2016)  . Hypertension   . Migraine    "qod now" (01/21/2016)  . Preterm labor ~ 2014  . Renal insufficiency   . Seizures ([place])    "last one was in 2000; related to preeclampsia" (01/21/2016)    Patient Active Problem List   Diagnosis Date Noted  . Abdominal pain 08/03/2016  . Chronic pain 01/21/2016  . Muscle cramps 03/29/2015  . Anemia of chronic kidney failure 09/15/2013  . Cocaine abuse ([place]) 08/26/2013  . Depression 04/21/2012  . ESRD (end stage renal disease) ([place]) 08/03/2011    Past Surgical History:  Procedure Laterality Date  . AV FISTULA PLACEMENT Left 2010  . CERVICAL BIOPSY  W/ LOOP ELECTRODE EXCISION  2001  . DILATION AND EVACUATION  08/02/2011   Procedure: DILATATION AND EVACUATION;  Surgeon: Logan Bores, MD;  Location: [facility];  Service: Gynecology;;  . Brigitte Pulse AND EVACUATION N/A 08/31/2013   Procedure: DILATATION AND EVACUATION;  Surgeon: Woodroe Mode, MD;  Location: [facility];  Service: Gynecology;  Laterality: N/A;  . RENAL BIOPSY    . REVISION OF ARTERIOVENOUS GORETEX GRAFT Left 02/11/2013   Procedure: REVISION OF ARTERIOVENOUS GORTEX FISTULA;  Surgeon: Rosetta Posner, MD;  Location: [facility];  Service: Vascular;  Laterality: Left;     OB History    Gravida  6   Para  1   Term  1   Preterm  0   AB  4   Living  1     SAB  2   TAB  2   Ectopic  0   Multiple  0   Live Births  1            Home Medications    Prior to Admission medications   Medication Sig Start Date End Date Taking? Authorizing Provider  albuterol (PROVENTIL  HFA;VENTOLIN HFA) 108 (90 Base) MCG/ACT inhaler Inhale 2 puffs into the lungs every 4 (four) hours as needed for wheezing or shortness of breath. 05/02/16  Yes Forde Dandy, MD  calcium acetate (PHOSLO) 667 MG capsule Take 1,334 mg by mouth 3 (three) times daily with meals. 11/30/17  Yes [provider]  cinacalcet (SENSIPAR) 30 MG tablet Take 60 mg by mouth every evening.    Yes [provider]  diphenhydrAMINE (BENADRYL) 25 MG tablet Take 1 tablet (25 mg total) by mouth every 6 (six) hours. 09/05/17  Yes Domenic Moras, PA-C  famotidine (PEPCID) 20 MG tablet Take 1 tablet (20 mg total) by mouth 2 (two) times daily. 09/05/17  Yes Domenic Moras, PA-C  Oxycodone HCl 10 MG TABS Take 1 tablet (10 mg total) by mouth daily. 08/04/16  Yes Asencion Partridge, MD  sevelamer carbonate (RENVELA) 800 MG tablet Take 2,400 mg by mouth 3 (three) times daily with meals.   Yes [provider]  sodium bicarbonate 325 MG tablet Take 325 mg by mouth 2 (two) times daily.   Yes [provider]  cyclobenzaprine (FLEXERIL) 10 MG tablet Take 1 tablet (10 mg total) by mouth 2 (two) times daily as needed for muscle spasms. Patient not taking: Reported on 12/07/2017 05/18/17   Gareth Morgan, MD  gabapentin (NEURONTIN) 100 MG capsule Take 3 capsules (300 mg total) by mouth 2 (two) times daily. Patient not taking: Reported on 12/07/2017 08/04/16   Asencion Partridge, MD  lidocaine (LIDODERM) 5 % Place 1 patch onto the skin daily. Remove & Discard patch within 12 hours or as directed by MD Patient not taking: Reported on 05/26/2017 05/18/17   Gareth Morgan, MD  ondansetron (ZOFRAN ODT) 4 MG disintegrating tablet Take 1 tablet (4 mg total) by mouth every 8 (eight) hours as needed for nausea or vomiting. Patient not taking: Reported on 12/07/2017 05/18/17   Gareth Morgan, MD    Family History Family History  Problem Relation Age of Onset  . Diabetes Mother   . Hyperlipidemia Mother   . Hypertension Mother   .  Heart disease Mother   . Hypertension Unknown   . Diabetes type II Unknown     Social History Social History   Tobacco Use  . Smoking status: Passive Smoke Exposure - Never Smoker  . Smokeless tobacco: Never Used  Substance Use Topics  . Alcohol use: No  . Drug use: Yes    Types: Marijuana    Comment: 01/21/2016 "maybe 3 times/week"  Allergies   Morphine; Prednisone; [place] [fish allergy]; Iodinated diagnostic agents; and Tape   Review of Systems Review of Systems  All other systems reviewed and are negative.    Physical Exam Updated Vital Signs BP 137/83   Pulse 93   Temp (!) 100.4 F (38 C) (Oral)   Resp (!) 29   Ht 5\' 5"  (1.651 m)   Wt 86.5 kg   LMP 11/14/2017 (Approximate)   SpO2 100%   BMI 31.73 kg/m   Physical Exam  Constitutional: She is oriented to person, place, and time. She appears well-developed and well-nourished.  Non-toxic appearance. She does not appear ill. No distress.  HENT:  Head: Normocephalic and atraumatic.  Right Ear: External ear normal.  Left Ear: External ear normal.  Nose: Nose normal. No mucosal edema or rhinorrhea.  Mouth/Throat: Oropharynx is clear and moist and mucous membranes are normal. No dental abscesses or uvula swelling.  Eyes: Pupils are equal, round, and reactive to light. Conjunctivae and EOM are normal.  Neck: Normal range of motion and full passive range of motion without pain. Neck supple.  Cardiovascular: Normal rate, regular rhythm and normal heart sounds. Exam reveals no gallop and no friction rub.  No murmur heard. Pulmonary/Chest: Effort normal and breath sounds normal. No respiratory distress. She has no wheezes. She has no rhonchi. She has no rales. She exhibits no tenderness and no crepitus.  Abdominal: Soft. Normal appearance and bowel sounds are normal. She exhibits no distension. There is no tenderness. There is no rebound and no guarding.  Musculoskeletal: Normal range of motion. She exhibits no edema or  tenderness.  Moves all extremities well.  She has a bruit and thrill in her left upper extremity.  She has diffuse tenderness in her lumbar spine.  Neurological: She is alert and oriented to person, place, and time. She has normal strength. No cranial nerve deficit.  Skin: Skin is warm, dry and intact. No rash noted. No erythema. No pallor.  Psychiatric: She has a normal mood and affect. Her speech is normal and behavior is normal. Her mood appears not anxious.  Nursing note and vitals reviewed.    ED Treatments / Results  Labs (all labs ordered are listed, but only abnormal results are displayed) Results for orders placed or performed during the hospital encounter of 12/07/17  Comprehensive metabolic panel  Result Value Ref Range   Sodium 138 135 - 145 mmol/L   Potassium 5.6 (H) 3.5 - 5.1 mmol/L   Chloride 97 (L) 98 - 111 mmol/L   CO2 21 (L) 22 - 32 mmol/L   Glucose, Bld 98 70 - 99 mg/dL   BUN 98 (H) 6 - 20 mg/dL   Creatinine, Ser 22.75 (H) 0.44 - 1.00 mg/dL   Calcium 9.4 8.9 - 10.3 mg/dL   Total Protein 7.0 6.5 - 8.1 g/dL   Albumin 3.1 (L) 3.5 - 5.0 g/dL   AST 14 (L) 15 - 41 U/L   ALT 6 0 - 44 U/L   Alkaline Phosphatase 41 38 - 126 U/L   Total Bilirubin 0.6 0.3 - 1.2 mg/dL   GFR calc non Af Amer 2 (L) >60 mL/min   GFR calc Af Amer 2 (L) >60 mL/min   Anion gap 20 (H) 5 - 15  CBC with Differential  Result Value Ref Range   WBC 9.5 4.0 - 10.5 K/uL   RBC 3.50 (L) 3.87 - 5.11 MIL/uL   Hemoglobin 8.6 (L) 12.0 - 15.0 g/dL   HCT  28.6 (L) 36.0 - 46.0 %   MCV 81.7 78.0 - 100.0 fL   MCH 24.6 (L) 26.0 - 34.0 pg   MCHC 30.1 30.0 - 36.0 g/dL   RDW 22.3 (H) 11.5 - 15.5 %   Platelets 223 150 - 400 K/uL   Neutrophils Relative % 77 %   Lymphocytes Relative 13 %   Monocytes Relative 9 %   Eosinophils Relative 1 %   Basophils Relative 0 %   Neutro Abs 7.3 1.7 - 7.7 K/uL   Lymphs Abs 1.2 0.7 - 4.0 K/uL   Monocytes Absolute 0.9 0.1 - 1.0 K/uL   Eosinophils Absolute 0.1 0.0 - 0.7 K/uL     Basophils Absolute 0.0 0.0 - 0.1 K/uL   RBC Morphology POLYCHROMASIA PRESENT   I-Stat CG4 Lactic Acid, ED  Result Value Ref Range   Lactic Acid, Venous 2.62 (HH) 0.5 - 1.9 mmol/L   Comment NOTIFIED PHYSICIAN   I-Stat beta hCG blood, ED  Result Value Ref Range   I-stat hCG, quantitative 6.2 (H) <5 mIU/mL   Comment 3          I-Stat CG4 Lactic Acid, ED  Result Value Ref Range   Lactic Acid, Venous 2.75 (HH) 0.5 - 1.9 mmol/L   Comment NOTIFIED PHYSICIAN     Laboratory interpretation all normal except elevated lactic acid, mildly elevated potassium, chronic renal failure   EKG EKG Interpretation  Date/Time:  Tuesday December 07 2017 01:37:26 EDT Ventricular Rate:  94 PR Interval:    QRS Duration: 75 QT Interval:  377 QTC Calculation: 472 R Axis:   51 Text Interpretation:  Sinus rhythm Borderline short PR interval Since last tracing rate faster 28 Aug 2017 Confirmed by Rolland Porter [phone]) on 12/07/2017 2:55:56 AM Also confirmed by Rolland Porter [phone]), editor Philomena Doheny [phone])  on 12/07/2017 6:52:44 AM   Radiology Dg Chest 2 View  Result Date: 12/07/2017 CLINICAL DATA:  Dyspnea EXAM: CHEST - 2 VIEW COMPARISON:  08/28/2017 chest radiograph. FINDINGS: Stable cardiomediastinal silhouette with top normal heart size. No pneumothorax. No pleural effusion. Diffuse hazy parahilar lung opacities. IMPRESSION: Diffuse hazy parahilar lung opacities, suggesting mild-to-moderate pulmonary edema. Electronically Signed   By: Ilona Sorrel M.D.   On: 12/07/2017 02:56   Dg Lumbar Spine Complete  Result Date: 12/07/2017 CLINICAL DATA:  42 y/o F; fall down stairs 12/04/2017. Lower back pain for 4 days. EXAM: LUMBAR SPINE - COMPLETE 4+ VIEW COMPARISON:  03/29/2015 lumbar spine radiographs. FINDINGS: There is no evidence of lumbar spine fracture. Alignment is normal. Mild stable loss of L5-S1 intervertebral disc space height. IMPRESSION: No acute fracture or dislocation identified. Stable mild loss of  L5-S1 intervertebral disc space height. Electronically Signed   By: Kristine Garbe M.D.   On: 12/07/2017 05:31   Ct Head Wo Contrast  Result Date: 12/07/2017 CLINICAL DATA:  42 y/o  F; headache, nausea, vomiting. EXAM: CT HEAD WITHOUT CONTRAST TECHNIQUE: Contiguous axial images were obtained from the base of the skull through the vertex without intravenous contrast. COMPARISON:  06/07/2016 CT head FINDINGS: Brain: No evidence of acute infarction, hemorrhage, hydrocephalus, extra-axial collection or mass lesion/mass effect. Vascular: No hyperdense vessel or unexpected calcification. Skull: Normal. Negative for fracture or focal lesion. Sinuses/Orbits: No acute finding. Other: None. IMPRESSION: Stable normal CT of head. Electronically Signed   By: Kristine Garbe M.D.   On: 12/07/2017 05:44    Procedures Procedures (including critical care time)  Medications Ordered in ED Medications  vancomycin (VANCOCIN) IVPB 1000  mg/200 mL premix (has no administration in time range)  piperacillin-tazobactam (ZOSYN) IVPB 3.375 g (has no administration in time range)  albuterol (PROVENTIL) (2.5 MG/3ML) 0.083% nebulizer solution 5 mg (5 mg Nebulization Given 12/07/17 0200)  acetaminophen (TYLENOL) tablet 650 mg (650 mg Oral Given 12/07/17 0229)  acetaminophen (TYLENOL) tablet 325 mg (325 mg Oral Given 12/07/17 0510)  oxyCODONE (Oxy IR/ROXICODONE) immediate release tablet 10 mg (10 mg Oral Given 12/07/17 0539)     Initial Impression / Assessment and Plan / ED Course  I have reviewed the triage vital signs and the nursing notes.  Pertinent labs & imaging results that were available during my care of the patient were reviewed by me and considered in my medical decision making (see chart for details).    Patient has fever of unknown source, her chest x-ray did not reveal pneumonia, her symptoms are most consistent with a respiratory infection.  Patient also has a graft since 2014 which could be a  source of infection.   07:22 AM Dr Marthenia Rolling, hospitalist, wants me to talk to nephrologist first and see if they want her admitted.   07:40 AM Dr Carolin Sicks, nephrology, feels uncomfortable treating for fever of unknown source and sending home, wants hospitalist to admit and either he or Dr Justin Mend will see for dialysis today.  He agrees with the antibiotics I ordered.  07:46 AM Dr Marthenia Rolling, wants patient to have blood drawn from graft by the dialysis nurse before she gets her antibiotics., pt's heart states she had a graft inserted in 2014.   Review of the [place] shows patient gets #90 oxycodone 10 mg monthly, last filled September 4, #90 alprazolam 2 mg tablets last filled September 4.  These are all prescribed by Dr. York Ram.  Her overdose risk score is 590  Final Clinical Impressions(s) / ED Diagnoses   Final diagnoses:  Fever, unspecified fever cause  Diarrhea, unspecified type  Fall on stairs, initial encounter  Acute midline low back pain with right-sided sciatica  Bronchitis    Plan admission  Rolland Porter, MD, Barbette Or, MD 12/07/17 [place], Todd, MD 12/07/17 [phone]

## 2017-12-07 NOTE — ED Notes (Addendum)
Lab work results delayed due to lab states never [employer]

## 2017-12-08 ENCOUNTER — Inpatient Hospital Stay (HOSPITAL_COMMUNITY): Payer: Medicare Other

## 2017-12-08 ENCOUNTER — Observation Stay (HOSPITAL_COMMUNITY): Payer: Medicare Other

## 2017-12-08 DIAGNOSIS — N186 End stage renal disease: Secondary | ICD-10-CM | POA: Diagnosis present

## 2017-12-08 DIAGNOSIS — M5441 Lumbago with sciatica, right side: Secondary | ICD-10-CM | POA: Diagnosis not present

## 2017-12-08 DIAGNOSIS — J811 Chronic pulmonary edema: Secondary | ICD-10-CM | POA: Diagnosis present

## 2017-12-08 DIAGNOSIS — G43909 Migraine, unspecified, not intractable, without status migrainosus: Secondary | ICD-10-CM | POA: Diagnosis present

## 2017-12-08 DIAGNOSIS — J45909 Unspecified asthma, uncomplicated: Secondary | ICD-10-CM | POA: Diagnosis present

## 2017-12-08 DIAGNOSIS — R197 Diarrhea, unspecified: Secondary | ICD-10-CM

## 2017-12-08 DIAGNOSIS — A084 Viral intestinal infection, unspecified: Secondary | ICD-10-CM | POA: Diagnosis present

## 2017-12-08 DIAGNOSIS — Z6831 Body mass index (BMI) 31.0-31.9, adult: Secondary | ICD-10-CM | POA: Diagnosis not present

## 2017-12-08 DIAGNOSIS — E872 Acidosis: Secondary | ICD-10-CM | POA: Diagnosis present

## 2017-12-08 DIAGNOSIS — W109XXA Fall (on) (from) unspecified stairs and steps, initial encounter: Secondary | ICD-10-CM | POA: Diagnosis not present

## 2017-12-08 DIAGNOSIS — Z833 Family history of diabetes mellitus: Secondary | ICD-10-CM | POA: Diagnosis not present

## 2017-12-08 DIAGNOSIS — E669 Obesity, unspecified: Secondary | ICD-10-CM | POA: Diagnosis present

## 2017-12-08 DIAGNOSIS — Z91041 Radiographic dye allergy status: Secondary | ICD-10-CM | POA: Diagnosis not present

## 2017-12-08 DIAGNOSIS — Z79899 Other long term (current) drug therapy: Secondary | ICD-10-CM | POA: Diagnosis not present

## 2017-12-08 DIAGNOSIS — M109 Gout, unspecified: Secondary | ICD-10-CM | POA: Diagnosis present

## 2017-12-08 DIAGNOSIS — Z8249 Family history of ischemic heart disease and other diseases of the circulatory system: Secondary | ICD-10-CM | POA: Diagnosis not present

## 2017-12-08 DIAGNOSIS — Z8349 Family history of other endocrine, nutritional and metabolic diseases: Secondary | ICD-10-CM | POA: Diagnosis not present

## 2017-12-08 DIAGNOSIS — I120 Hypertensive chronic kidney disease with stage 5 chronic kidney disease or end stage renal disease: Secondary | ICD-10-CM | POA: Diagnosis present

## 2017-12-08 DIAGNOSIS — N2581 Secondary hyperparathyroidism of renal origin: Secondary | ICD-10-CM | POA: Diagnosis present

## 2017-12-08 DIAGNOSIS — Y92009 Unspecified place in unspecified non-institutional (private) residence as the place of occurrence of the external cause: Secondary | ICD-10-CM | POA: Diagnosis not present

## 2017-12-08 DIAGNOSIS — J40 Bronchitis, not specified as acute or chronic: Secondary | ICD-10-CM

## 2017-12-08 DIAGNOSIS — Z79891 Long term (current) use of opiate analgesic: Secondary | ICD-10-CM | POA: Diagnosis not present

## 2017-12-08 DIAGNOSIS — K219 Gastro-esophageal reflux disease without esophagitis: Secondary | ICD-10-CM | POA: Diagnosis present

## 2017-12-08 DIAGNOSIS — Z91018 Allergy to other foods: Secondary | ICD-10-CM | POA: Diagnosis not present

## 2017-12-08 DIAGNOSIS — Z992 Dependence on renal dialysis: Secondary | ICD-10-CM | POA: Diagnosis not present

## 2017-12-08 DIAGNOSIS — Z885 Allergy status to narcotic agent status: Secondary | ICD-10-CM | POA: Diagnosis not present

## 2017-12-08 DIAGNOSIS — J90 Pleural effusion, not elsewhere classified: Secondary | ICD-10-CM | POA: Diagnosis present

## 2017-12-08 DIAGNOSIS — E875 Hyperkalemia: Secondary | ICD-10-CM | POA: Diagnosis present

## 2017-12-08 DIAGNOSIS — Z889 Allergy status to unspecified drugs, medicaments and biological substances status: Secondary | ICD-10-CM | POA: Diagnosis not present

## 2017-12-08 LAB — BASIC METABOLIC PANEL
Anion gap: 13 (ref 5–15)
BUN: 38 mg/dL — ABNORMAL HIGH (ref 6–20)
CO2: 26 mmol/L (ref 22–32)
Calcium: 8 mg/dL — ABNORMAL LOW (ref 8.9–10.3)
Chloride: 96 mmol/L — ABNORMAL LOW (ref 98–111)
Creatinine, Ser: 12.62 mg/dL — ABNORMAL HIGH (ref 0.44–1.00)
GFR calc Af Amer: 4 mL/min — ABNORMAL LOW (ref >60)
GFR calc non Af Amer: 3 mL/min — ABNORMAL LOW (ref >60)
Glucose, Bld: 81 mg/dL (ref 70–99)
Potassium: 5.6 mmol/L — ABNORMAL HIGH (ref 3.5–5.1)
Sodium: 135 mmol/L (ref 135–145)

## 2017-12-08 LAB — CBC
HCT: 29.6 % — ABNORMAL LOW (ref 36.0–46.0)
Hemoglobin: 8.9 g/dL — ABNORMAL LOW (ref 12.0–15.0)
MCH: 24.5 pg — ABNORMAL LOW (ref 26.0–34.0)
MCHC: 30.1 g/dL (ref 30.0–36.0)
MCV: 81.3 fL (ref 78.0–100.0)
Platelets: 204 10*3/uL (ref 150–400)
RBC: 3.64 MIL/uL — ABNORMAL LOW (ref 3.87–5.11)
RDW: 22.8 % — ABNORMAL HIGH (ref 11.5–15.5)
WBC: 6.9 10*3/uL (ref 4.0–10.5)

## 2017-12-08 LAB — HIV ANTIBODY (ROUTINE TESTING W REFLEX): HIV Screen 4th Generation wRfx: NONREACTIVE

## 2017-12-08 MED ORDER — PIPERACILLIN-TAZOBACTAM 3.375 G IVPB
3.3750 g | Freq: Two times a day (BID) | INTRAVENOUS | Status: DC
  Administered 2017-12-08 (×2): 3.375 g via INTRAVENOUS
  Filled 2017-12-08 (×3): qty 50

## 2017-12-08 MED ORDER — SODIUM CHLORIDE 0.9 % IV SOLN
1.0000 g | Freq: Once | INTRAVENOUS | Status: DC
  Filled 2017-12-08: qty 1

## 2017-12-08 MED ORDER — CHLORHEXIDINE GLUCONATE CLOTH 2 % EX PADS
6.0000 | MEDICATED_PAD | Freq: Every day | CUTANEOUS | Status: DC
  Administered 2017-12-08: 6 via TOPICAL

## 2017-12-08 MED ORDER — SODIUM POLYSTYRENE SULFONATE 15 GM/60ML PO SUSP
30.0000 g | Freq: Once | ORAL | Status: DC
  Filled 2017-12-08: qty 120

## 2017-12-08 MED ORDER — HEPARIN SODIUM (PORCINE) 1000 UNIT/ML DIALYSIS: 1000.0000 [IU] | INTRAMUSCULAR | Status: DC | PRN

## 2017-12-08 MED ORDER — LIDOCAINE-PRILOCAINE 2.5-2.5 % EX CREA: 1.0000 "application " | TOPICAL_CREAM | CUTANEOUS | Status: DC | PRN

## 2017-12-08 MED ORDER — SODIUM CHLORIDE 0.9 % IV SOLN: 100.0000 mL | INTRAVENOUS | Status: DC | PRN

## 2017-12-08 MED ORDER — PENTAFLUOROPROP-TETRAFLUOROETH EX AERO: 1.0000 "application " | INHALATION_SPRAY | CUTANEOUS | Status: DC | PRN

## 2017-12-08 MED ORDER — LIDOCAINE HCL (PF) 1 % IJ SOLN: 5.0000 mL | INTRAMUSCULAR | Status: DC | PRN

## 2017-12-08 MED ORDER — SODIUM CHLORIDE 0.9 % IV SOLN: 2.0000 g | INTRAVENOUS | Status: DC

## 2017-12-08 MED ORDER — HEPARIN SODIUM (PORCINE) 1000 UNIT/ML DIALYSIS
5000.0000 [IU] | INTRAMUSCULAR | Status: DC | PRN
  Filled 2017-12-08: qty 5

## 2017-12-08 NOTE — Progress Notes (Addendum)
Pharmacy Antibiotic Note  Devanee GORDANA [name] is a 42 y.o. female admitted on 12/07/2017 with back pain due to a mechanical fall. Pharmacy consulted for cefepime and vancomycin.  Day #2 of abx for sepsis. CXR shows some lung opacities. Cx's pending  Plan: Continue vancomycin 1g IV QHD-TTS Stop cefepime, start Zosyn 3.375 gm IV q12h (4 hour infusion) Monitor clinical picture, renal function, preHD VR prn F/U C&S, abx deescalation / LOT  Height: 5\' 5"  (165.1 cm) Weight: 201 lb 15.1 oz (91.6 kg) IBW/kg (Calculated) : 57  Temp (24hrs), Avg:99.5 F (37.5 C), Min:98.5 F (36.9 C), Max:101.8 F (38.8 C)  Recent Labs  Lab 12/07/17 0335 12/07/17 0452 12/07/17 0503 12/07/17 1039 12/08/17 0545  WBC  --  9.5  --  8.1 6.9  CREATININE  --  22.75*  --  22.90* 12.62*  LATICACIDVEN 2.62*  --  2.75*  --   --     Antimicrobials this admission: 9/24 cefepime > 9/25 9/24 vancomycin > 9/25 Zosyn >  Dose adjustments this admission: N/A  Microbiology results: 9/24 blood cx: 9/24 GI PCR:  Desiree [name] 12/08/2017 9:15 AM

## 2017-12-08 NOTE — Progress Notes (Addendum)
[facility]                                                                              Patient Demographics  Desiree [name], is a 42 y.o. female, DOB - June 15, 1975, VHQ:[id number]  Admit date - 12/07/2017   Admitting Physician Bonnell Public, MD  Outpatient Primary MD for the patient is Harvie Junior, MD  Outpatient specialists:   LOS - 0  days   Medical records reviewed and are as summarized below:    Chief Complaint  Patient presents with  . Shortness of Breath       Brief summary   Patient is a 42 year old African-American female with history of ESRD on hemodialysis, asthma, GERD, gout, hypertension, seizure, migraine presented to ED with fevers and diarrhea.  Patient reports diarrhea for last 2 to 3 weeks, watery, nonbloody, fevers in the last 2 days with temp of 103 F at home patient is dialyzed via AV fistula and reported balloon angioplasty of the AV fistula about 3 to 4 weeks ago. Patient was admitted for further work-up  Assessment & Plan    Principal problem Fevers of unknown origin, abdominal pain, diarrhea -Blood cultures in process, lipase 76 with lactic acidosis -CT abdomen showed mild thickening of the gallbladder wall versus small pericholecystic fluid -Will obtain right upper quadrant ultrasound -Overnight temp of 101.8 F -Continue IV vancomycin, changed to IV Zosyn.  DC cefepime   Diarrhea -Follow stool studies   ESRD on hemodialysis, TTS Nephrology consulted, HD per renal  Lactic acidosis, hyperkalemia -Hemodialysis per nephrology  Asthma -Currently stable  Code Status: Full CODE STATUS DVT Prophylaxis: Heparin subcu Family Communication: Discussed in detail with the patient, all imaging results, lab results explained to the patient    Disposition Plan:  Time Spent in minutes 25 minutes  Procedures:  None  Consultants:   Nephrology  Antimicrobials:   IV vancomycin 9/24  IV Zosyn  9/25   Medications  Scheduled Meds: . calcium acetate  1,334 mg Oral TID WC  . Chlorhexidine Gluconate Cloth  6 each Topical Q0600  . cinacalcet  60 mg Oral QPM  . doxercalciferol  3 mcg Intravenous Q T,Th,Sa-HD  . famotidine  20 mg Oral BID  . heparin  5,000 Units Subcutaneous Q12H  . sevelamer carbonate  2,400 mg Oral TID WC   Continuous Infusions: . sodium chloride    . sodium chloride    . ceFEPime (MAXIPIME) IV    . [START ON 12/09/2017] ceFEPime (MAXIPIME) IV    . [START ON 12/09/2017] vancomycin     PRN Meds:.sodium chloride, sodium chloride, acetaminophen, albuterol, heparin, heparin, HYDROcodone-acetaminophen, lidocaine (PF), lidocaine-prilocaine, ondansetron, pentafluoroprop-tetrafluoroeth   Antibiotics   Anti-infectives (From admission, onward)   Start     Dose/Rate Route Frequency Ordered Stop   12/09/17 1800  ceFEPIme (MAXIPIME) 2 g in sodium chloride 0.9 % 100 mL IVPB     2 g 200 mL/hr over 30 Minutes Intravenous Every T-Th-Sa (Hemodialysis) 12/08/17 0914     12/09/17 1200  vancomycin (VANCOCIN) IVPB 1000 mg/200 mL premix     1,000 mg 200 mL/hr over 60 Minutes Intravenous  Every T-Th-Sa (Hemodialysis) 12/07/17 0909     12/08/17 1800  ceFEPIme (MAXIPIME) 1 g in sodium chloride 0.9 % 100 mL IVPB  Status:  Discontinued     1 g 200 mL/hr over 30 Minutes Intravenous Every 24 hours 12/07/17 0855 12/08/17 0914   12/08/17 1000  ceFEPIme (MAXIPIME) 1 g in sodium chloride 0.9 % 100 mL IVPB     1 g 200 mL/hr over 30 Minutes Intravenous  Once 12/08/17 0914     12/07/17 1200  vancomycin (VANCOCIN) 1,750 mg in sodium chloride 0.9 % 500 mL IVPB     1,750 mg 250 mL/hr over 120 Minutes Intravenous Every T-Th-Sa (Hemodialysis) 12/07/17 1054 12/07/17 1731   12/07/17 1100  vancomycin (VANCOCIN) 1,750 mg in sodium chloride 0.9 % 500 mL IVPB  Status:  Discontinued     1,750 mg 250 mL/hr over 120 Minutes Intravenous  Once 12/07/17 1049 12/07/17 1054   12/07/17 1100  ceFEPIme  (MAXIPIME) 2 g in sodium chloride 0.9 % 100 mL IVPB  Status:  Discontinued     2 g 200 mL/hr over 30 Minutes Intravenous  Once 12/07/17 1049 12/07/17 1052   12/07/17 1100  ceFEPIme (MAXIPIME) 1 g in sodium chloride 0.9 % 100 mL IVPB     1 g 200 mL/hr over 30 Minutes Intravenous  Once 12/07/17 1052 12/07/17 1147   12/07/17 1000  vancomycin (VANCOCIN) 1,750 mg in sodium chloride 0.9 % 500 mL IVPB  Status:  Discontinued     1,750 mg 250 mL/hr over 120 Minutes Intravenous  Once 12/07/17 0855 12/07/17 1049   12/07/17 1000  ceFEPIme (MAXIPIME) 1 g in sodium chloride 0.9 % 100 mL IVPB  Status:  Discontinued     1 g 200 mL/hr over 30 Minutes Intravenous  Once 12/07/17 0855 12/07/17 1141   12/07/17 0730  vancomycin (VANCOCIN) IVPB 1000 mg/200 mL premix  Status:  Discontinued     1,000 mg 200 mL/hr over 60 Minutes Intravenous  Once 12/07/17 0728 12/07/17 0855   12/07/17 0730  piperacillin-tazobactam (ZOSYN) IVPB 3.375 g  Status:  Discontinued     3.375 g 100 mL/hr over 30 Minutes Intravenous  Once 12/07/17 0728 12/07/17 0855        Subjective:   Desiree [name] was seen and examined today.  Feels miserable, complaining of abdominal pain, nausea.  Temp of 101.8 F yesterday evening.  Low-grade temp 9 9.9 F this morning. Patient denies dizziness, chest pain, shortness of breath.   Objective:   Vitals:   12/07/17 1756 12/07/17 2106 12/08/17 0609 12/08/17 0910  BP: (!) 152/75 136/79 (!) 141/70 (!) 163/92  Pulse: 93 87 85 84  Resp: 18 18 18 18   Temp: (!) 101.8 F (38.8 C) 98.5 F (36.9 C) 98.9 F (37.2 C) 99.9 F (37.7 C)  TempSrc: Oral  Oral Oral  SpO2: 98% 100% 100% 99%  Weight:      Height:        Intake/Output Summary (Last 24 hours) at 12/08/2017 1250 Last data filed at 12/08/2017 0610 Gross per 24 hour  Intake 0 ml  Output 2656 ml  Net -2656 ml     Wt Readings from Last 3 Encounters:  12/07/17 91.6 kg  05/26/17 93 kg  05/18/17 91.5 kg     Exam  General: Alert and  oriented x 3, NAD, miserable   Eyes: PERRLA, EOMI, Anicteric Sclera,  HEENT:  Atraumatic, normocephalic  Cardiovascular: S1 S2 auscultated, RRR  Respiratory: CTAB  Gastrointestinal: Soft, mild diffuse  tenderness, ND   Ext: no pedal edema bilaterally  Neuro: no FND's   Musculoskeletal: No digital cyanosis, clubbing  Skin: No rashes  Psych: anxious, alert and oriented x3    Data Reviewed:  I have personally reviewed following labs and imaging studies  Micro Results No results found for this or any previous visit (from the past 240 hour(s)).  Radiology Reports Ct Abdomen Pelvis Wo Contrast  Result Date: 12/08/2017 CLINICAL DATA:  42 year old female with fever of unknown origin. History of renal insufficiency. EXAM: CT ABDOMEN AND PELVIS WITHOUT CONTRAST TECHNIQUE: Multidetector CT imaging of the abdomen and pelvis was performed following the standard protocol without IV contrast. COMPARISON:  CT of the abdomen pelvis dated 12/20/2013 and abdominal ultrasound dated 08/04/2016 FINDINGS: Evaluation of this exam is limited in the absence of intravenous contrast. Lower chest: Partially visualized trace bilateral pleural effusions. There is diffuse interstitial and interlobular septal prominence consistent with interstitial edema. There is hypoattenuation of the cardiac blood pool suggestive of a degree of anemia. Clinical correlation is recommended. No intra-abdominal free air or free fluid. Hepatobiliary: The liver is unremarkable. No intrahepatic biliary ductal dilatation. There is mild thickening of the gallbladder wall versus small pericholecystic fluid. Probable noncalcified stones within the gallbladder. Ultrasound may provide better evaluation of the gallbladder. Pancreas: Unremarkable. No pancreatic ductal dilatation or surrounding inflammatory changes. Spleen: Normal in size without focal abnormality. Adrenals/Urinary Tract: The adrenal glands are unremarkable. Bilateral renal atrophy  consistent with chronic kidney disease. An 11 cm hypodense lesion with partial rim calcification rising from the left kidney is not characterized on this noncontrast CT but corresponds to the cyst seen on the prior contrast enhanced CT and MRI. This appears to have increased in size since the prior CT. No hydronephrosis on either side. The urinary bladder is mildly distended. Stomach/Bowel: There is no bowel obstruction or active inflammation. Normal appendix. Vascular/Lymphatic: The abdominal aorta and IVC are grossly unremarkable. No portal venous gas. There is no adenopathy. Reproductive: The uterus is anteverted and grossly unremarkable. The ovaries are grossly unremarkable. No pelvic mass. Other: None Musculoskeletal: No acute or significant osseous findings. IMPRESSION: 1. Interstitial edema and small bilateral pleural effusions. 2. Probable noncalcified stone within the gallbladder. Apparent diffuse gallbladder wall thickening versus edema. Ultrasound may provide better evaluation of the gallbladder. 3. Atrophic kidneys. Interval increase in the size of the left renal cyst. 4. No bowel obstruction or active inflammation.  Normal appendix. Electronically Signed   By: Anner Crete M.D.   On: 12/08/2017 05:53   Dg Chest 2 View  Result Date: 12/07/2017 CLINICAL DATA:  Dyspnea EXAM: CHEST - 2 VIEW COMPARISON:  08/28/2017 chest radiograph. FINDINGS: Stable cardiomediastinal silhouette with top normal heart size. No pneumothorax. No pleural effusion. Diffuse hazy parahilar lung opacities. IMPRESSION: Diffuse hazy parahilar lung opacities, suggesting mild-to-moderate pulmonary edema. Electronically Signed   By: Ilona Sorrel M.D.   On: 12/07/2017 02:56   Dg Lumbar Spine Complete  Result Date: 12/07/2017 CLINICAL DATA:  42 y/o F; fall down stairs 12/04/2017. Lower back pain for 4 days. EXAM: LUMBAR SPINE - COMPLETE 4+ VIEW COMPARISON:  03/29/2015 lumbar spine radiographs. FINDINGS: There is no evidence of  lumbar spine fracture. Alignment is normal. Mild stable loss of L5-S1 intervertebral disc space height. IMPRESSION: No acute fracture or dislocation identified. Stable mild loss of L5-S1 intervertebral disc space height. Electronically Signed   By: Kristine Garbe M.D.   On: 12/07/2017 05:31   Ct Head Wo Contrast  Result Date:  12/07/2017 CLINICAL DATA:  42 y/o  F; headache, nausea, vomiting. EXAM: CT HEAD WITHOUT CONTRAST TECHNIQUE: Contiguous axial images were obtained from the base of the skull through the vertex without intravenous contrast. COMPARISON:  06/07/2016 CT head FINDINGS: Brain: No evidence of acute infarction, hemorrhage, hydrocephalus, extra-axial collection or mass lesion/mass effect. Vascular: No hyperdense vessel or unexpected calcification. Skull: Normal. Negative for fracture or focal lesion. Sinuses/Orbits: No acute finding. Other: None. IMPRESSION: Stable normal CT of head. Electronically Signed   By: Kristine Garbe M.D.   On: 12/07/2017 05:44    Lab Data:  CBC: Recent Labs  Lab 12/07/17 0452 12/07/17 1039 12/08/17 0545  WBC 9.5 8.1 6.9  NEUTROABS 7.3  --   --   HGB 8.6* 8.3* 8.9*  HCT 28.6* 27.7* 29.6*  MCV 81.7 81.5 81.3  PLT 223 235 092   Basic Metabolic Panel: Recent Labs  Lab 12/07/17 0452 12/07/17 1039 12/08/17 0545  NA 138  --  135  K 5.6*  --  5.6*  CL 97*  --  96*  CO2 21*  --  26  GLUCOSE 98  --  81  BUN 98*  --  38*  CREATININE 22.75* 22.90* 12.62*  CALCIUM 9.4  --  8.0*  MG  --  2.3  --   PHOS  --  7.3*  --    GFR: Estimated Creatinine Clearance: 6.6 mL/min (A) (by C-G formula based on SCr of 12.62 mg/dL (H)). Liver Function Tests: Recent Labs  Lab 12/07/17 0452  AST 14*  ALT 6  ALKPHOS 41  BILITOT 0.6  PROT 7.0  ALBUMIN 3.1*   No results for input(s): LIPASE, AMYLASE in the last 168 hours. No results for input(s): AMMONIA in the last 168 hours. Coagulation Profile: No results for input(s): INR, PROTIME in  the last 168 hours. Cardiac Enzymes: No results for input(s): CKTOTAL, CKMB, CKMBINDEX, TROPONINI in the last 168 hours. BNP (last 3 results) No results for input(s): PROBNP in the last 8760 hours. HbA1C: No results for input(s): HGBA1C in the last 72 hours. CBG: No results for input(s): GLUCAP in the last 168 hours. Lipid Profile: No results for input(s): CHOL, HDL, LDLCALC, TRIG, CHOLHDL, LDLDIRECT in the last 72 hours. Thyroid Function Tests: No results for input(s): TSH, T4TOTAL, FREET4, T3FREE, THYROIDAB in the last 72 hours. Anemia Panel: No results for input(s): VITAMINB12, FOLATE, FERRITIN, TIBC, IRON, RETICCTPCT in the last 72 hours. Urine analysis:    Component Value Date/Time   COLORURINE YELLOW 12/20/2013 0530   APPEARANCEUR CLOUDY (A) 12/20/2013 0530   LABSPEC 1.009 12/20/2013 0530   PHURINE 8.0 12/20/2013 0530   GLUCOSEU 100 (A) 12/20/2013 0530   HGBUR LARGE (A) 12/20/2013 0530   BILIRUBINUR NEGATIVE 12/20/2013 0530   KETONESUR NEGATIVE 12/20/2013 0530   PROTEINUR 100 (A) 12/20/2013 0530   UROBILINOGEN 0.2 12/20/2013 0530   NITRITE NEGATIVE 12/20/2013 0530   LEUKOCYTESUR TRACE (A) 12/20/2013 0530     Ripudeep Rai M.D. [facility] 12/08/2017, 12:50 PM  Pager: [phone] Between 7am to 7pm - call Pager - [phone]  After 7pm go to [email or web address] - password [email or web address]  Call night coverage person covering after 7pm

## 2017-12-08 NOTE — Progress Notes (Signed)
[facility] Desiree [name] Progress Note   Subjective:  Having body aches/chills today No diarrhea today but hasn't eaten  Some cramping with dialysis yesterday   Objective Vitals:   12/07/17 1756 12/07/17 2106 12/08/17 0609 12/08/17 0910  BP: (!) 152/75 136/79 (!) 141/70 (!) 163/92  Pulse: 93 87 85 84  Resp: 18 18 18 18   Temp: (!) 101.8 F (38.8 C) 98.5 F (36.9 C) 98.9 F (37.2 C) 99.9 F (37.7 C)  TempSrc: Oral  Oral Oral  SpO2: 98% 100% 100% 99%  Weight:      Height:       Physical Exam General: WNWD female NAD Heart: RRR Lungs: CTAB  Abdomen: soft NT/ND Extremities: No LE edema  Dialysis Access: LUE AVF +bruit   Dialysis Orders:  AF TThS 3.75h 180NRe 400/800 EDW 86.5kg 2K/2.25  L AVF Heparin 6000 U -Hectorol 11mcg IV TIW -Mircera 225 IV q 2 weeks (last 9/16) -PhosLo 2 tabs qac, -Renvela 3 tabs q ac -Sensipar 60 qd   Assessment/Plan: 1. Fever -- Blood cultures pending. IV Vanc/Cefepime started. -Self cannulates with buttonholes -AVF does not appear infected  2. Diarrhea - GI panel pending - per primary  3. ESRD -  HD TTS. Continue on schedule Next HD 9/26.  4. Hypertension/volume  - BP better controlled after HD. Continue home meds. Not to EDW yet. Titrate volume down with UF.  5. Anemia  - Hgb 8.6>8.3  Follow trends. Next ESA dose due 9/30  6. Metabolic bone disease -  Ca ok. Phos elevated. Continue VDRA/binders/Sensipar.  7. Nutrition - Renal diet/vitamins    Lynnda Child PA-C [facility] Desiree [name] Pager [phone] 12/08/2017,9:22 AM  LOS: 0 days   Additional Objective Labs: Basic Metabolic Panel: Recent Labs  Lab 12/07/17 0452 12/07/17 1039 12/08/17 0545  NA 138  --  135  K 5.6*  --  5.6*  CL 97*  --  96*  CO2 21*  --  26  GLUCOSE 98  --  81  BUN 98*  --  38*  CREATININE 22.75* 22.90* 12.62*  CALCIUM 9.4  --  8.0*  PHOS  --  7.3*  --    CBC: Recent Labs  Lab 12/07/17 0452 12/07/17 1039 12/08/17 0545  WBC 9.5 8.1 6.9   NEUTROABS 7.3  --   --   HGB 8.6* 8.3* 8.9*  HCT 28.6* 27.7* 29.6*  MCV 81.7 81.5 81.3  PLT 223 235 204   Blood Culture    Component Value Date/Time   SDES NASOPHARYNGEAL 08/03/2016 1617   SPECREQUEST NONE 08/03/2016 1617   CULT NO MRSA DETECTED 08/03/2016 1617   REPTSTATUS 08/05/2016 FINAL 08/03/2016 1617    Cardiac Enzymes: No results for input(s): CKTOTAL, CKMB, CKMBINDEX, TROPONINI in the last 168 hours. CBG: No results for input(s): GLUCAP in the last 168 hours. Iron Studies: No results for input(s): IRON, TIBC, TRANSFERRIN, FERRITIN in the last 72 hours. Lab Results  Component Value Date   INR 1.09 03/29/2015   INR 1.05 09/15/2013   Medications: . ceFEPime (MAXIPIME) IV    . [START ON 12/09/2017] ceFEPime (MAXIPIME) IV    . [START ON 12/09/2017] vancomycin     . Barium Sulfate      . calcium acetate  1,334 mg Oral TID WC  . cinacalcet  60 mg Oral QPM  . doxercalciferol  3 mcg Intravenous Q T,Th,Sa-HD  . famotidine  20 mg Oral BID  . heparin  5,000 Units Subcutaneous Q12H  . iopamidol      .  sevelamer carbonate  2,400 mg Oral TID WC

## 2017-12-09 LAB — RENAL FUNCTION PANEL
ANION GAP: 15 (ref 5–15)
Albumin: 3.1 g/dL — ABNORMAL LOW (ref 3.5–5.0)
BUN: 26 mg/dL — ABNORMAL HIGH (ref 6–20)
CALCIUM: 7.8 mg/dL — AB (ref 8.9–10.3)
CO2: 27 mmol/L (ref 22–32)
CREATININE: 9.65 mg/dL — AB (ref 0.44–1.00)
Chloride: 98 mmol/L (ref 98–111)
GFR, EST AFRICAN AMERICAN: 5 mL/min — AB (ref >60)
GFR, EST NON AFRICAN AMERICAN: 4 mL/min — AB (ref >60)
Glucose, Bld: 88 mg/dL (ref 70–99)
Phosphorus: 5 mg/dL — ABNORMAL HIGH (ref 2.5–4.6)
Potassium: 4.3 mmol/L (ref 3.5–5.1)
SODIUM: 140 mmol/L (ref 135–145)

## 2017-12-09 LAB — CBC
HCT: 31.2 % — ABNORMAL LOW (ref 36.0–46.0)
HEMOGLOBIN: 9.3 g/dL — AB (ref 12.0–15.0)
MCH: 24.3 pg — ABNORMAL LOW (ref 26.0–34.0)
MCHC: 29.8 g/dL — ABNORMAL LOW (ref 30.0–36.0)
MCV: 81.7 fL (ref 78.0–100.0)
PLATELETS: 234 10*3/uL (ref 150–400)
RBC: 3.82 MIL/uL — AB (ref 3.87–5.11)
RDW: 22.6 % — ABNORMAL HIGH (ref 11.5–15.5)
WBC: 5.8 10*3/uL (ref 4.0–10.5)

## 2017-12-09 MED ORDER — VANCOMYCIN HCL IN DEXTROSE 1-5 GM/200ML-% IV SOLN
INTRAVENOUS | Status: AC
  Filled 2017-12-09: qty 200

## 2017-12-09 MED ORDER — ONDANSETRON 4 MG PO TBDP: 4.0000 mg | ORAL_TABLET | Freq: Three times a day (TID) | ORAL | 0 refills | Status: DC | PRN

## 2017-12-09 MED ORDER — PENTAFLUOROPROP-TETRAFLUOROETH EX AERO: 1.0000 "application " | INHALATION_SPRAY | CUTANEOUS | Status: DC | PRN

## 2017-12-09 MED ORDER — HEPARIN SODIUM (PORCINE) 1000 UNIT/ML DIALYSIS: 20.0000 [IU]/kg | INTRAMUSCULAR | Status: DC | PRN

## 2017-12-09 MED ORDER — RENA-VITE PO TABS
1.0000 | ORAL_TABLET | Freq: Every day | ORAL | Status: DC
  Filled 2017-12-09: qty 1

## 2017-12-09 MED ORDER — LIDOCAINE HCL (PF) 1 % IJ SOLN: 5.0000 mL | INTRAMUSCULAR | Status: DC | PRN

## 2017-12-09 MED ORDER — LIDOCAINE-PRILOCAINE 2.5-2.5 % EX CREA: 1.0000 "application " | TOPICAL_CREAM | CUTANEOUS | Status: DC | PRN

## 2017-12-09 MED ORDER — ALTEPLASE 2 MG IJ SOLR: 2.0000 mg | Freq: Once | INTRAMUSCULAR | Status: DC | PRN

## 2017-12-09 MED ORDER — SODIUM CHLORIDE 0.9 % IV SOLN: 100.0000 mL | INTRAVENOUS | Status: DC | PRN

## 2017-12-09 MED ORDER — HEPARIN SODIUM (PORCINE) 1000 UNIT/ML DIALYSIS: 1000.0000 [IU] | INTRAMUSCULAR | Status: DC | PRN

## 2017-12-09 MED ORDER — DOXERCALCIFEROL 4 MCG/2ML IV SOLN
INTRAVENOUS | Status: AC
  Administered 2017-12-09: 3 ug
  Filled 2017-12-09: qty 2

## 2017-12-09 NOTE — Procedures (Addendum)
Patient was seen on dialysis and the procedure was supervised.  BFR 400 Via AVF BP is 156/100 .   Patient appears to be tolerating treatment well. Afebrile, culture negative. Clinically improved. She had dialysis yesterday.    Renato Spellman Tanna Furry 12/09/2017

## 2017-12-09 NOTE — Progress Notes (Signed)
[facility] Progress Note   Subjective:  Afebrile overnight  Seen on HD UF goal 3.5L Feels much better. No complaints. Says going home today    Objective Vitals:   12/09/17 0830 12/09/17 0900 12/09/17 0930 12/09/17 1000  BP: (!) 156/100 (!) 162/97 (!) 150/104 (!) 149/94  Pulse: 86 79 83 96  Resp:      Temp:      TempSrc:      SpO2:      Weight:      Height:       Physical Exam General: WNWD female NAD Heart: RRR Lungs: CTAB  Abdomen: soft NT/ND Extremities: No LE edema  Dialysis Access: LUE AVF +bruit   Dialysis Orders:  AF TThS 3.75h 180NRe 400/800 EDW 86.5kg 2K/2.25  L AVF Heparin 6000 U -Hectorol 30mcg IV TIW -Mircera 225 IV q 2 weeks (last 9/16) -PhosLo 2 tabs qac, -Renvela 3 tabs q ac -Sensipar 60 qd   Assessment/Plan: 1. Fever -- WBCs trending down.  Blood cultures NGTD. -Self cannulates with buttonholes -AVF does not appear infected. On Vanc/Zosyn  2. Diarrhea/Abd pain Resolved today - GI panel pending - per primary. 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 ESRD -  HD TTS. Continue on schedule. HD today  4. Hypertension/volume  - BP better. Continue home meds. Not to EDW yet. Titrate volume down with UF.  5. Anemia  - Hgb 8.9 Follow trends. Next ESA dose due 9/30  6. Metabolic bone disease -  Ca ok. Phos elevated. Continue VDRA/binders/Sensipar.  7. Nutrition - Renal diet/vitamins    Lynnda Child PA-C [facility] Pager [phone] 12/09/2017,10:24 AM  LOS: 1 day   Additional Objective Labs: Basic Metabolic Panel: Recent Labs  Lab 12/07/17 0452 12/07/17 1039 12/08/17 0545  NA 138  --  135  K 5.6*  --  5.6*  CL 97*  --  96*  CO2 21*  --  26  GLUCOSE 98  --  81  BUN 98*  --  38*  CREATININE 22.75* 22.90* 12.62*  CALCIUM 9.4  --  8.0*  PHOS  --  7.3*  --    CBC: Recent Labs  Lab 12/07/17 0452 12/07/17 1039 12/08/17 0545  WBC 9.5 8.1 6.9  NEUTROABS 7.3  --   --   HGB 8.6* 8.3* 8.9*  HCT 28.6*  27.7* 29.6*  MCV 81.7 81.5 81.3  PLT 223 235 204   Blood Culture    Component Value Date/Time   SDES BLOOD RIGHT WRIST 12/07/2017 0452   SPECREQUEST  12/07/2017 0452    BOTTLES DRAWN AEROBIC AND ANAEROBIC Blood Culture results may not be optimal due to an inadequate volume of blood received in culture bottles   CULT  12/07/2017 0452    NO GROWTH 2 DAYS Performed at [facility] [address], [place], [place] [address]    REPTSTATUS PENDING 12/07/2017 0034    Cardiac Enzymes: No results for input(s): CKTOTAL, CKMB, CKMBINDEX, TROPONINI in the last 168 hours. CBG: No results for input(s): GLUCAP in the last 168 hours. Iron Studies: No results for input(s): IRON, TIBC, TRANSFERRIN, FERRITIN in the last 72 hours. Lab Results  Component Value Date   INR 1.09 03/29/2015   INR 1.05 09/15/2013   Medications: . sodium chloride    . sodium chloride    . piperacillin-tazobactam (ZOSYN)  IV 3.375 g (12/08/17 2211)  . vancomycin     . calcium acetate  1,334 mg Oral TID WC  .  Chlorhexidine Gluconate Cloth  6 each Topical Q0600  . cinacalcet  60 mg Oral QPM  . doxercalciferol      . doxercalciferol  3 mcg Intravenous Q T,Th,Sa-HD  . famotidine  20 mg Oral BID  . heparin  5,000 Units Subcutaneous Q12H  . sevelamer carbonate  2,400 mg Oral TID WC

## 2017-12-09 NOTE — Progress Notes (Signed)
Pt. Refused tight heparin dose for HD tx. Dr. Carolin Sicks aware

## 2017-12-09 NOTE — Discharge Summary (Signed)
Physician Discharge Summary   Patient ID: Desiree [name] MRN: [id number] DOB/AGE: 05-12-1975 42 y.o.  Admit date: 12/07/2017 Discharge date: 12/09/2017  Primary Care Physician:  Harvie Junior, MD   Recommendations for Outpatient Follow-up:  1. Follow up with PCP in 1-2 weeks   Home Health: None  Equipment/Devices: none   Discharge Condition: stable  CODE STATUS: FULL  Diet recommendation:    Discharge Diagnoses:    Fever of unclear etiology Diarrhea possibly due to viral gastroenteritis, resolved ESRD on hemodialysis Hyperkalemia Lactic acidosis Asthma  Consults: Nephrology    Allergies:   Allergies  Allergen Reactions  . Morphine Shortness Of Breath and Anaphylaxis  . Prednisone Other (See Comments)    Other reaction(s): Other (See Comments) Muscle spasms Patient says prednisone causes her to cramp all over, muscle spasms uncontrolled  . Geralyn Flash [Fish Allergy] Itching, Swelling, Rash and Other (See Comments)    Face droops also  . Amlodipine     Angioedema (09/05/17 ED visit)  . Iodinated Diagnostic Agents Itching  . Tape Itching     DISCHARGE MEDICATIONS: Allergies as of 12/09/2017      Reactions   Morphine Shortness Of Breath, Anaphylaxis   Prednisone Other (See Comments)   Other reaction(s): Other (See Comments) Muscle spasms Patient says prednisone causes her to cramp all over, muscle spasms uncontrolled   [place] [fish Allergy] Itching, Swelling, Rash, Other (See Comments)   Face droops also   Amlodipine    Angioedema (09/05/17 ED visit)   Iodinated Diagnostic Agents Itching   Tape Itching      Medication List    STOP taking these medications   Oxycodone HCl 10 MG Tabs     TAKE these medications   albuterol 108 (90 Base) MCG/ACT inhaler Commonly known as:  PROVENTIL HFA;VENTOLIN HFA Inhale 2 puffs into the lungs every 4 (four) hours as needed for wheezing or shortness of breath.   calcium acetate 667 MG capsule Commonly known as:   PHOSLO Take 1,334 mg by mouth 3 (three) times daily with meals.   cinacalcet 30 MG tablet Commonly known as:  SENSIPAR Take 60 mg by mouth every evening.   diphenhydrAMINE 25 MG tablet Commonly known as:  BENADRYL Take 1 tablet (25 mg total) by mouth every 6 (six) hours.   famotidine 20 MG tablet Commonly known as:  PEPCID Take 1 tablet (20 mg total) by mouth 2 (two) times daily.   ondansetron 4 MG disintegrating tablet Commonly known as:  ZOFRAN-ODT Take 1 tablet (4 mg total) by mouth every 8 (eight) hours as needed for nausea or vomiting.   sevelamer carbonate 800 MG tablet Commonly known as:  RENVELA Take 2,400 mg by mouth 3 (three) times daily with meals.   sodium bicarbonate 325 MG tablet Take 325 mg by mouth 2 (two) times daily.        Brief H and P: For complete details please refer to admission H and P, but in brief  Patient is a 42 year old African-American female with history of ESRD on hemodialysis, asthma, GERD, gout, hypertension, seizure, migraine presented to ED with fevers and diarrhea.  Patient reports diarrhea for last 2 to 3 weeks, watery, nonbloody, fevers in the last 2 days with temp of 103 F at home patient is dialyzed via AV fistula and reported balloon angioplasty of the AV fistula about 3 to 4 weeks ago. Patient was admitted for further work-up.  Hospital Course:   Fevers of unknown origin, abdominal pain, diarrhea: Possibly viral  gastroenteritis -Blood cultures negative so far.  Afebrile, clinically improved, no diarrhea -CT abdomen showed small bilateral pleural effusions with probable noncalcified stone within the gallbladder.  Due to question of possible cholecystitis, abdominal ultrasound was done which showed no gallstones or acute cholecystitis, questionable adenomyosis of gallbladder -Patient was placed empirically on IV antibiotics -I discussed with gastroenterology, Dr. Alessandra Bevels who did not feel patient needed any acute cholecystectomy.   I also discussed with general surgery, Claiborne Billings, PA, who discussed with Dr. Grandville Silos who felt that this is a benign incidental finding and if patient is not having any symptoms, patient does not need any cholecystectomy. -Patient is doing well, no fevers in the last 24 hours, tolerating diet, wants to go home.   Diarrhea -No further diarrhea, likely viral gastroenteritis   ESRD on hemodialysis, TTS Nephrology was consulted, patient underwent hemodialysis per her schedule  Lactic acidosis, hyperkalemia -Potassium 5.6 at the time of admission, underwent hemodialysis per nephrology Potassium 4.3 at the time of discharge.  Asthma -Currently stable  Day of Discharge S: No complaints, tolerating diet, afebrile.  Wants to go home.  BP 130/86 (BP Location: Right Arm)   Pulse (!) 107   Temp 98.3 F (36.8 C) (Oral)   Resp 18   Ht 5\' 5"  (1.651 m)   Wt 86.7 kg   LMP 11/14/2017 (Approximate)   SpO2 99%   BMI 31.81 kg/m   Physical Exam: General: Alert and awake oriented x3 not in any acute distress. HEENT: anicteric sclera, pupils reactive to light and accommodation CVS: S1-S2 clear no murmur rubs or gallops Chest: clear to auscultation bilaterally, no wheezing rales or rhonchi Abdomen: soft nontender, nondistended, normal bowel sounds Extremities: no cyanosis, clubbing or edema noted bilaterally Neuro: Cranial nerves II-XII intact, no focal neurological deficits   The results of significant diagnostics from this hospitalization (including imaging, microbiology, ancillary and laboratory) are listed below for reference.      Procedures/Studies:  Ct Abdomen Pelvis Wo Contrast  Result Date: 12/08/2017 CLINICAL DATA:  42 year old female with fever of unknown origin. History of renal insufficiency. EXAM: CT ABDOMEN AND PELVIS WITHOUT CONTRAST TECHNIQUE: Multidetector CT imaging of the abdomen and pelvis was performed following the standard protocol without IV contrast. COMPARISON:   CT of the abdomen pelvis dated 12/20/2013 and abdominal ultrasound dated 08/04/2016 FINDINGS: Evaluation of this exam is limited in the absence of intravenous contrast. Lower chest: Partially visualized trace bilateral pleural effusions. There is diffuse interstitial and interlobular septal prominence consistent with interstitial edema. There is hypoattenuation of the cardiac blood pool suggestive of a degree of anemia. Clinical correlation is recommended. No intra-abdominal free air or free fluid. Hepatobiliary: The liver is unremarkable. No intrahepatic biliary ductal dilatation. There is mild thickening of the gallbladder wall versus small pericholecystic fluid. Probable noncalcified stones within the gallbladder. Ultrasound may provide better evaluation of the gallbladder. Pancreas: Unremarkable. No pancreatic ductal dilatation or surrounding inflammatory changes. Spleen: Normal in size without focal abnormality. Adrenals/Urinary Tract: The adrenal glands are unremarkable. Bilateral renal atrophy consistent with chronic kidney disease. An 11 cm hypodense lesion with partial rim calcification rising from the left kidney is not characterized on this noncontrast CT but corresponds to the cyst seen on the prior contrast enhanced CT and MRI. This appears to have increased in size since the prior CT. No hydronephrosis on either side. The urinary bladder is mildly distended. Stomach/Bowel: There is no bowel obstruction or active inflammation. Normal appendix. Vascular/Lymphatic: The abdominal aorta and IVC are grossly unremarkable.  No portal venous gas. There is no adenopathy. Reproductive: The uterus is anteverted and grossly unremarkable. The ovaries are grossly unremarkable. No pelvic mass. Other: None Musculoskeletal: No acute or significant osseous findings. IMPRESSION: 1. Interstitial edema and small bilateral pleural effusions. 2. Probable noncalcified stone within the gallbladder. Apparent diffuse gallbladder  wall thickening versus edema. Ultrasound may provide better evaluation of the gallbladder. 3. Atrophic kidneys. Interval increase in the size of the left renal cyst. 4. No bowel obstruction or active inflammation.  Normal appendix. Electronically Signed   By: Anner Crete M.D.   On: 12/08/2017 05:53   Dg Chest 2 View  Result Date: 12/07/2017 CLINICAL DATA:  Dyspnea EXAM: CHEST - 2 VIEW COMPARISON:  08/28/2017 chest radiograph. FINDINGS: Stable cardiomediastinal silhouette with top normal heart size. No pneumothorax. No pleural effusion. Diffuse hazy parahilar lung opacities. IMPRESSION: Diffuse hazy parahilar lung opacities, suggesting mild-to-moderate pulmonary edema. Electronically Signed   By: Ilona Sorrel M.D.   On: 12/07/2017 02:56   Dg Lumbar Spine Complete  Result Date: 12/07/2017 CLINICAL DATA:  42 y/o F; fall down stairs 12/04/2017. Lower back pain for 4 days. EXAM: LUMBAR SPINE - COMPLETE 4+ VIEW COMPARISON:  03/29/2015 lumbar spine radiographs. FINDINGS: There is no evidence of lumbar spine fracture. Alignment is normal. Mild stable loss of L5-S1 intervertebral disc space height. IMPRESSION: No acute fracture or dislocation identified. Stable mild loss of L5-S1 intervertebral disc space height. Electronically Signed   By: Kristine Garbe M.D.   On: 12/07/2017 05:31   Ct Head Wo Contrast  Result Date: 12/07/2017 CLINICAL DATA:  42 y/o  F; headache, nausea, vomiting. EXAM: CT HEAD WITHOUT CONTRAST TECHNIQUE: Contiguous axial images were obtained from the base of the skull through the vertex without intravenous contrast. COMPARISON:  06/07/2016 CT head FINDINGS: Brain: No evidence of acute infarction, hemorrhage, hydrocephalus, extra-axial collection or mass lesion/mass effect. Vascular: No hyperdense vessel or unexpected calcification. Skull: Normal. Negative for fracture or focal lesion. Sinuses/Orbits: No acute finding. Other: None. IMPRESSION: Stable normal CT of head.  Electronically Signed   By: Kristine Garbe M.D.   On: 12/07/2017 05:44   US Abdomen Limited Ruq  Result Date: 12/08/2017 CLINICAL DATA:  Cholelithiasis, history hypertension, end-stage renal disease on dialysis EXAM: ULTRASOUND ABDOMEN LIMITED RIGHT UPPER QUADRANT COMPARISON:  CT abdomen and pelvis 12/08/2017 FINDINGS: Gallbladder: Incompletely distended gallbladder. No gallbladder wall thickening, shadowing calculi, or sonographic Murphy sign. Echogenic focus with twinkling artifact from anterior wall of the gallbladder question due to adenomyosis of the gallbladder. No pericholecystic fluid. Common bile duct: Diameter: 5 mm diameter, normal Liver: Normal appearance without mass or nodularity. Portal vein is patent on color Doppler imaging with normal direction of blood flow towards the liver. No RIGHT upper quadrant free fluid. IMPRESSION: Question adenomyosis of the gallbladder wall. Otherwise negative exam. Electronically Signed   By: Lavonia Dana M.D.   On: 12/08/2017 17:30      LAB RESULTS: Basic Metabolic Panel: Recent Labs  Lab 12/07/17 1039 12/08/17 0545 12/09/17 0815  NA  --  135 140  K  --  5.6* 4.3  CL  --  96* 98  CO2  --  26 27  GLUCOSE  --  81 88  BUN  --  38* 26*  CREATININE 22.90* 12.62* 9.65*  CALCIUM  --  8.0* 7.8*  MG 2.3  --   --   PHOS 7.3*  --  5.0*   Liver Function Tests: Recent Labs  Lab 12/07/17 0452 12/09/17 0815  AST 14*  --   ALT 6  --   ALKPHOS 41  --   BILITOT 0.6  --   PROT 7.0  --   ALBUMIN 3.1* 3.1*   No results for input(s): LIPASE, AMYLASE in the last 168 hours. No results for input(s): AMMONIA in the last 168 hours. CBC: Recent Labs  Lab 12/07/17 0452  12/08/17 0545 12/09/17 0815  WBC 9.5   < > 6.9 5.8  NEUTROABS 7.3  --   --   --   HGB 8.6*   < > 8.9* 9.3*  HCT 28.6*   < > 29.6* 31.2*  MCV 81.7   < > 81.3 81.7  PLT 223   < > 204 234   < > = values in this interval not displayed.   Cardiac Enzymes: No results for  input(s): CKTOTAL, CKMB, CKMBINDEX, TROPONINI in the last 168 hours. BNP: Invalid input(s): POCBNP CBG: No results for input(s): GLUCAP in the last 168 hours.    Disposition and Follow-up: Discharge Instructions    Diet - low sodium heart healthy   Complete by:  As directed    Increase activity slowly   Complete by:  As directed        DISPOSITION:home   DISCHARGE FOLLOW-UP Follow-up Information    Harvie Junior, MD. Schedule an appointment as soon as possible for a visit in 2 week(s).   Specialty:  Family Medicine Contact information: [address] [place] [address] [phone]            Time coordinating discharge:  35 minutes  Signed:   Estill Cotta M.D. [facility] 12/09/2017, 12:45 PM Pager: [phone]

## 2017-12-12 LAB — CULTURE, BLOOD (ROUTINE X 2)
Culture: NO GROWTH
Culture: NO GROWTH

## 2018-04-12 ENCOUNTER — Emergency Department (HOSPITAL_COMMUNITY)
Admission: EM | Admit: 2018-04-12 | Discharge: 2018-04-12 | Disposition: A | Discharge status: Home / Self Care | Payer: Medicare Other | Attending: Emergency Medicine | Admitting: Emergency Medicine

## 2018-04-12 DIAGNOSIS — R509 Fever, unspecified: Secondary | ICD-10-CM | POA: Insufficient documentation

## 2018-04-12 DIAGNOSIS — I120 Hypertensive chronic kidney disease with stage 5 chronic kidney disease or end stage renal disease: Secondary | ICD-10-CM | POA: Insufficient documentation

## 2018-04-12 DIAGNOSIS — J45909 Unspecified asthma, uncomplicated: Secondary | ICD-10-CM | POA: Diagnosis not present

## 2018-04-12 DIAGNOSIS — Z7722 Contact with and (suspected) exposure to environmental tobacco smoke (acute) (chronic): Secondary | ICD-10-CM | POA: Diagnosis not present

## 2018-04-12 DIAGNOSIS — R109 Unspecified abdominal pain: Secondary | ICD-10-CM | POA: Insufficient documentation

## 2018-04-12 DIAGNOSIS — R112 Nausea with vomiting, unspecified: Secondary | ICD-10-CM | POA: Diagnosis not present

## 2018-04-12 DIAGNOSIS — N186 End stage renal disease: Secondary | ICD-10-CM | POA: Diagnosis not present

## 2018-04-12 DIAGNOSIS — Z992 Dependence on renal dialysis: Secondary | ICD-10-CM | POA: Diagnosis not present

## 2018-04-12 DIAGNOSIS — Z79899 Other long term (current) drug therapy: Secondary | ICD-10-CM | POA: Diagnosis not present

## 2018-04-12 LAB — COMPREHENSIVE METABOLIC PANEL
ALT: 7 U/L (ref 0–44)
AST: 10 U/L — ABNORMAL LOW (ref 15–41)
Albumin: 3.5 g/dL (ref 3.5–5.0)
Alkaline Phosphatase: 36 U/L — ABNORMAL LOW (ref 38–126)
Anion gap: 17 — ABNORMAL HIGH (ref 5–15)
BILIRUBIN TOTAL: 0.7 mg/dL (ref 0.3–1.2)
BUN: 40 mg/dL — ABNORMAL HIGH (ref 6–20)
CO2: 25 mmol/L (ref 22–32)
Calcium: 9.5 mg/dL (ref 8.9–10.3)
Chloride: 95 mmol/L — ABNORMAL LOW (ref 98–111)
Creatinine, Ser: 18.66 mg/dL — ABNORMAL HIGH (ref 0.44–1.00)
GFR calc Af Amer: 2 mL/min — ABNORMAL LOW (ref >60)
GFR calc non Af Amer: 2 mL/min — ABNORMAL LOW (ref >60)
Glucose, Bld: 102 mg/dL — ABNORMAL HIGH (ref 70–99)
Potassium: 4.1 mmol/L (ref 3.5–5.1)
Sodium: 137 mmol/L (ref 135–145)
TOTAL PROTEIN: 7.8 g/dL (ref 6.5–8.1)

## 2018-04-12 LAB — CBC WITH DIFFERENTIAL/PLATELET
Abs Immature Granulocytes: 0.04 10*3/uL (ref 0.00–0.07)
Basophils Absolute: 0 10*3/uL (ref 0.0–0.1)
Basophils Relative: 0 %
EOS ABS: 0 10*3/uL (ref 0.0–0.5)
Eosinophils Relative: 1 %
HCT: 31.8 % — ABNORMAL LOW (ref 36.0–46.0)
Hemoglobin: 9.7 g/dL — ABNORMAL LOW (ref 12.0–15.0)
Immature Granulocytes: 1 %
Lymphocytes Relative: 14 %
Lymphs Abs: 1 10*3/uL (ref 0.7–4.0)
MCH: 25.3 pg — ABNORMAL LOW (ref 26.0–34.0)
MCHC: 30.5 g/dL (ref 30.0–36.0)
MCV: 82.8 fL (ref 80.0–100.0)
Monocytes Absolute: 0.7 10*3/uL (ref 0.1–1.0)
Monocytes Relative: 11 %
NEUTROS PCT: 73 %
Neutro Abs: 5.2 10*3/uL (ref 1.7–7.7)
Platelets: 196 10*3/uL (ref 150–400)
RBC: 3.84 MIL/uL — ABNORMAL LOW (ref 3.87–5.11)
RDW: 17.7 % — AB (ref 11.5–15.5)
WBC: 6.9 10*3/uL (ref 4.0–10.5)
nRBC: 0 % (ref 0.0–0.2)

## 2018-04-12 LAB — I-STAT TROPONIN, ED: Troponin i, poc: 0.01 ng/mL (ref 0.00–0.08)

## 2018-04-12 LAB — I-STAT BETA HCG BLOOD, ED (MC, WL, AP ONLY)

## 2018-04-12 LAB — LACTIC ACID, PLASMA: Lactic Acid, Venous: 1.4 mmol/L (ref 0.5–1.9)

## 2018-04-12 LAB — PROTIME-INR
INR: 1.21
Prothrombin Time: 15.2 seconds (ref 11.4–15.2)

## 2018-04-12 MED ORDER — SODIUM CHLORIDE 0.9% FLUSH: 3.0000 mL | Freq: Once | INTRAVENOUS | Status: DC

## 2018-04-12 MED ORDER — PROMETHAZINE HCL 25 MG RE SUPP: 25.0000 mg | Freq: Four times a day (QID) | RECTAL | 0 refills | Status: DC | PRN

## 2018-04-12 MED ORDER — ACETAMINOPHEN 325 MG PO TABS
650.0000 mg | ORAL_TABLET | Freq: Once | ORAL | Status: AC | PRN
  Administered 2018-04-12: 650 mg via ORAL
  Filled 2018-04-12: qty 2

## 2018-04-12 NOTE — ED Provider Notes (Signed)
[facility] EMERGENCY DEPARTMENT Provider Note   CSN: [id number] Arrival date & time: 04/12/18  1341     History   Chief Complaint Chief Complaint  Patient presents with  . Vascular Access Problem  . Fever    HPI Desiree [name] is a 43 y.o. female.  43 yo F with a cc of fever, n/v and abdominal pain for the past few months.  Started about three days ago.    The history is provided by the patient.  Fever  Associated symptoms: no chest pain, no chills, no congestion, no dysuria, no headaches, no myalgias, no nausea, no rhinorrhea and no vomiting   Illness  Severity:  Mild Onset quality:  Gradual Duration:  3 days Timing:  Constant Progression:  Worsening Chronicity:  New Associated symptoms: abdominal pain and fever   Associated symptoms: no chest pain, no congestion, no headaches, no myalgias, no nausea, no rhinorrhea, no shortness of breath, no vomiting and no wheezing     Past Medical History:  Diagnosis Date  . Childhood asthma   . Complication of anesthesia    "sometimes it does not work; didn't during [facility]" (01/21/2016)  . ESRD (end stage renal disease) on dialysis ([place])    "TTS; [facility]; [facility]" (08/03/2016)  . GERD (gastroesophageal reflux disease)    nexium prn  . Gout   . History of blood transfusion    "related to kidneys; I've had 4" (01/21/2016)  . Hypertension   . Migraine    "qod now" (01/21/2016)  . Preterm labor ~ 2014  . Renal insufficiency   . Seizures ([place])    "last one was in 2000; related to preeclampsia" (01/21/2016)    Patient Active Problem List   Diagnosis Date Noted  . Fever 12/07/2017  . Abdominal pain 08/03/2016  . Chronic pain 01/21/2016  . Muscle cramps 03/29/2015  . Anemia of chronic kidney failure 09/15/2013  . Cocaine abuse ([place]) 08/26/2013  . Depression 04/21/2012  . ESRD (end stage renal disease) ([place]) 08/03/2011    Past Surgical History:  Procedure Laterality Date  . AV FISTULA  PLACEMENT Left 2010  . CERVICAL BIOPSY  W/ LOOP ELECTRODE EXCISION  2001  . DILATION AND EVACUATION  08/02/2011   Procedure: DILATATION AND EVACUATION;  Surgeon: Logan Bores, MD;  Location: [facility];  Service: Gynecology;;  . Brigitte Pulse AND EVACUATION N/A 08/31/2013   Procedure: DILATATION AND EVACUATION;  Surgeon: Woodroe Mode, MD;  Location: [facility];  Service: Gynecology;  Laterality: N/A;  . RENAL BIOPSY    . REVISION OF ARTERIOVENOUS GORETEX GRAFT Left 02/11/2013   Procedure: REVISION OF ARTERIOVENOUS GORTEX FISTULA;  Surgeon: Rosetta Posner, MD;  Location: [facility];  Service: Vascular;  Laterality: Left;     OB History    Gravida  6   Para  1   Term  1   Preterm  0   AB  4   Living  1     SAB  2   TAB  2   Ectopic  0   Multiple  0   Live Births  1            Home Medications    Prior to Admission medications   Medication Sig Start Date End Date Taking? Authorizing Provider  albuterol (PROVENTIL HFA;VENTOLIN HFA) 108 (90 Base) MCG/ACT inhaler Inhale 2 puffs into the lungs every 4 (four) hours as needed for wheezing or shortness of breath. 05/02/16   Oleta Mouse,  Eugenia Mcalpine, MD  calcium acetate (PHOSLO) 667 MG capsule Take 1,334 mg by mouth 3 (three) times daily with meals. 11/30/17   [provider]  cinacalcet (SENSIPAR) 30 MG tablet Take 60 mg by mouth every evening.     [provider]  diphenhydrAMINE (BENADRYL) 25 MG tablet Take 1 tablet (25 mg total) by mouth every 6 (six) hours. 09/05/17   Domenic Moras, PA-C  famotidine (PEPCID) 20 MG tablet Take 1 tablet (20 mg total) by mouth 2 (two) times daily. 09/05/17   Domenic Moras, PA-C  ondansetron (ZOFRAN ODT) 4 MG disintegrating tablet Take 1 tablet (4 mg total) by mouth every 8 (eight) hours as needed for nausea or vomiting. 12/09/17   Rai, Vernelle Emerald, MD  promethazine (PHENERGAN) 25 MG suppository Place 1 suppository (25 mg total) rectally every 6 (six) hours as needed for nausea or vomiting. 04/12/18   Deno Etienne, DO  sevelamer carbonate (RENVELA) 800 MG tablet Take 2,400 mg by mouth 3 (three) times daily with meals.    [provider]  sodium bicarbonate 325 MG tablet Take 325 mg by mouth 2 (two) times daily.    [provider]    Family History Family History  Problem Relation Age of Onset  . Diabetes Mother   . Hyperlipidemia Mother   . Hypertension Mother   . Heart disease Mother   . Hypertension Unknown   . Diabetes type II Unknown     Social History Social History   Tobacco Use  . Smoking status: Passive Smoke Exposure - Never Smoker  . Smokeless tobacco: Never Used  Substance Use Topics  . Alcohol use: No  . Drug use: Yes    Types: Marijuana    Comment: 01/21/2016 "maybe 3 times/week"     Allergies   Morphine; Prednisone; [place] [fish allergy]; Amlodipine; Iodinated diagnostic agents; and Tape   Review of Systems Review of Systems  Constitutional: Positive for fever. Negative for chills.  HENT: Negative for congestion and rhinorrhea.   Eyes: Negative for redness and visual disturbance.  Respiratory: Negative for shortness of breath and wheezing.   Cardiovascular: Negative for chest pain and palpitations.  Gastrointestinal: Positive for abdominal pain. Negative for nausea and vomiting.  Genitourinary: Negative for dysuria and urgency.  Musculoskeletal: Negative for arthralgias and myalgias.  Skin: Negative for pallor and wound.  Neurological: Negative for dizziness and headaches.     Physical Exam Updated Vital Signs BP (!) 150/84   Pulse 77   Temp 98.9 F (37.2 C) (Oral)   Resp 18   Ht 5\' 5"  (1.651 m)   Wt 86.5 kg   LMP 04/05/2018 (Exact Date)   SpO2 94%   BMI 31.73 kg/m   Physical Exam Vitals signs and nursing note reviewed.  Constitutional:      General: She is not in acute distress.    Appearance: She is well-developed. She is not diaphoretic.  HENT:     Head: Normocephalic and atraumatic.  Eyes:     Pupils: Pupils are equal,  round, and reactive to light.  Neck:     Musculoskeletal: Normal range of motion and neck supple.  Cardiovascular:     Rate and Rhythm: Normal rate and regular rhythm.     Heart sounds: No murmur. No friction rub. No gallop.   Pulmonary:     Effort: Pulmonary effort is normal.     Breath sounds: No wheezing or rales.  Abdominal:     General: There is no distension.  Palpations: Abdomen is soft.     Tenderness: There is no abdominal tenderness.  Musculoskeletal:        General: No tenderness.  Skin:    General: Skin is warm and dry.  Neurological:     Mental Status: She is alert and oriented to person, place, and time.  Psychiatric:        Behavior: Behavior normal.      ED Treatments / Results  Labs (all labs ordered are listed, but only abnormal results are displayed) Labs Reviewed  COMPREHENSIVE METABOLIC PANEL - Abnormal; Notable for the following components:      Result Value   Chloride 95 (*)    Glucose, Bld 102 (*)    BUN 40 (*)    Creatinine, Ser 18.66 (*)    AST 10 (*)    Alkaline Phosphatase 36 (*)    GFR calc non Af Amer 2 (*)    GFR calc Af Amer 2 (*)    Anion gap 17 (*)    All other components within normal limits  CBC WITH DIFFERENTIAL/PLATELET - Abnormal; Notable for the following components:   RBC 3.84 (*)    Hemoglobin 9.7 (*)    HCT 31.8 (*)    MCH 25.3 (*)    RDW 17.7 (*)    All other components within normal limits  CULTURE, BLOOD (ROUTINE X 2)  CULTURE, BLOOD (ROUTINE X 2)  LACTIC ACID, PLASMA  PROTIME-INR  LACTIC ACID, PLASMA  URINALYSIS, ROUTINE W REFLEX MICROSCOPIC  I-STAT BETA HCG BLOOD, ED ([facility], [facility], [facility] ONLY)  I-STAT TROPONIN, ED    EKG EKG Interpretation  Date/Time:  Tuesday April 12 2018 13:54:23 EST Ventricular Rate:  101 PR Interval:  114 QRS Duration: 70 QT Interval:  356 QTC Calculation: 461 R Axis:   58 Text Interpretation:  Sinus tachycardia Nonspecific ST and T wave abnormality Abnormal ECG peaked t waves present  on prior ecg now resolved Otherwise no significant change Confirmed by Deno Etienne [phone]) on 04/12/2018 9:44:36 PM   Radiology No results found.  Procedures Procedures (including critical care time)  Medications Ordered in ED Medications  sodium chloride flush (NS) 0.9 % injection 3 mL (has no administration in time range)  acetaminophen (TYLENOL) tablet 650 mg (650 mg Oral Given 04/12/18 1358)     Initial Impression / Assessment and Plan / ED Course  I have reviewed the triage vital signs and the nursing notes.  Pertinent labs & imaging results that were available during my care of the patient were reviewed by me and considered in my medical decision making (see chart for details).     43 yo F with recurrent abdominal pain fever and vomiting.  Going on for at least the past 4 months.  Is off and on.  On my exam the patient is well-appearing and nontoxic.  She is tolerating p.o. without difficulty.  Her fever has come down.  She does not need urgent dialysis she is not acidotic she has a normal potassium she is not fluid overloaded.  We will have her follow-up with her nephrologist.  12:12 AM:  I have discussed the diagnosis/risks/treatment options with the patient and family and believe the pt to be eligible for discharge home to follow-up with PCP. We also discussed returning to the ED immediately if new or worsening sx occur. We discussed the sx which are most concerning (e.g., sudden worsening pain, fever, inability to tolerate by mouth) that necessitate immediate return. Medications administered to the  patient during their visit and any new prescriptions provided to the patient are listed below.  Medications given during this visit Medications  sodium chloride flush (NS) 0.9 % injection 3 mL (has no administration in time range)  acetaminophen (TYLENOL) tablet 650 mg (650 mg Oral Given 04/12/18 1358)     The patient appears reasonably screen and/or stabilized for discharge and I  doubt any other medical condition or other [facility] requiring further screening, evaluation, or treatment in the ED at this time prior to discharge.    Final Clinical Impressions(s) / ED Diagnoses   Final diagnoses:  Nausea and vomiting in adult    ED Discharge Orders         Ordered    promethazine (PHENERGAN) 25 MG suppository  Every 6 hours PRN     04/12/18 2231           Deno Etienne, DO 04/13/18 0012

## 2018-04-12 NOTE — ED Triage Notes (Addendum)
Patient sent to ED from dialysis for bleeding fistula - she reports they stuck it and "it blew" - access catheter removed and pressure bandage applied. Thrill palpable, pulses equal bilaterally. Bleeding appears to be controlled with pressure. Additionally, patient c/o fever, N/V x 3 days - last took Tylenol with relief last night. Also endorsing generalized abdominal pain and chest pain. Denies shortness of breath, cough, or diarrhea. Resp e/u. Patient appears in nad.

## 2018-04-17 LAB — CULTURE, BLOOD (ROUTINE X 2)
Culture: NO GROWTH
Culture: NO GROWTH
Special Requests: ADEQUATE
Special Requests: ADEQUATE

## 2018-05-02 ENCOUNTER — Other Ambulatory Visit: Payer: Self-pay | Admitting: Nephrology

## 2018-05-02 ENCOUNTER — Ambulatory Visit: Admission: RE | Admit: 2018-05-02 | Payer: Self-pay | Source: Ambulatory Visit

## 2018-05-02 ENCOUNTER — Ambulatory Visit
Admission: RE | Admit: 2018-05-02 | Discharge: 2018-05-02 | Disposition: A | Discharge status: Home / Self Care | Payer: Medicare Other | Source: Ambulatory Visit | Attending: Nephrology | Admitting: Nephrology

## 2018-05-02 DIAGNOSIS — R109 Unspecified abdominal pain: Secondary | ICD-10-CM

## 2018-05-20 ENCOUNTER — Other Ambulatory Visit: Payer: Self-pay

## 2018-05-20 ENCOUNTER — Encounter (HOSPITAL_COMMUNITY): Payer: Self-pay | Admitting: Emergency Medicine

## 2018-05-20 ENCOUNTER — Emergency Department (HOSPITAL_COMMUNITY)
Admission: EM | Admit: 2018-05-20 | Discharge: 2018-05-20 | Disposition: A | Discharge status: Home / Self Care | Payer: Medicare Other | Attending: Emergency Medicine | Admitting: Emergency Medicine

## 2018-05-20 DIAGNOSIS — Z79899 Other long term (current) drug therapy: Secondary | ICD-10-CM | POA: Insufficient documentation

## 2018-05-20 DIAGNOSIS — H5789 Other specified disorders of eye and adnexa: Secondary | ICD-10-CM | POA: Diagnosis present

## 2018-05-20 DIAGNOSIS — J45909 Unspecified asthma, uncomplicated: Secondary | ICD-10-CM | POA: Insufficient documentation

## 2018-05-20 DIAGNOSIS — I120 Hypertensive chronic kidney disease with stage 5 chronic kidney disease or end stage renal disease: Secondary | ICD-10-CM | POA: Diagnosis not present

## 2018-05-20 DIAGNOSIS — Z992 Dependence on renal dialysis: Secondary | ICD-10-CM | POA: Diagnosis not present

## 2018-05-20 DIAGNOSIS — H00011 Hordeolum externum right upper eyelid: Secondary | ICD-10-CM | POA: Insufficient documentation

## 2018-05-20 DIAGNOSIS — Z7722 Contact with and (suspected) exposure to environmental tobacco smoke (acute) (chronic): Secondary | ICD-10-CM | POA: Diagnosis not present

## 2018-05-20 DIAGNOSIS — N186 End stage renal disease: Secondary | ICD-10-CM | POA: Insufficient documentation

## 2018-05-20 DIAGNOSIS — H1031 Unspecified acute conjunctivitis, right eye: Secondary | ICD-10-CM | POA: Diagnosis not present

## 2018-05-20 DIAGNOSIS — F1410 Cocaine abuse, uncomplicated: Secondary | ICD-10-CM | POA: Insufficient documentation

## 2018-05-20 MED ORDER — SULFACETAMIDE SODIUM 10 % OP SOLN: 1.0000 [drp] | Freq: Four times a day (QID) | OPHTHALMIC | 0 refills | Status: DC

## 2018-05-20 NOTE — ED Provider Notes (Signed)
[facility] DEPT Provider Note   CSN: [id number] Arrival date & time: 05/20/18  [place]    History   Chief Complaint Chief Complaint  Patient presents with  . Eye Pain  . Eye Drainage    HPI Desiree [name] is a 43 y.o. female with a past medical history of ESRD on dialysis, hypertension, who presents to ED for 3-day history of right-sided eye drainage, irritation and tearing.  Reports having a stye on this eyelid as well.  She woke up this morning with eyelids matted together.  She can now feel similar symptoms in her left eye.  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  She wears glasses and denies any vision changes.  She got over-the-counter stye cream today with no improvement in her symptoms.  No sick contacts with similar symptoms that she knows of.  Denies any contact lens use, pain with EOMs, trauma to the area, foreign body sensation, proptosis.    HPI  Past Medical History:  Diagnosis Date  . Childhood asthma   . Complication of anesthesia    "sometimes it does not work; didn't during [facility]" (01/21/2016)  . ESRD (end stage renal disease) on dialysis ([place])    "TTS; [facility]; [facility]" (08/03/2016)  . GERD (gastroesophageal reflux disease)    nexium prn  . Gout   . History of blood transfusion    "related to kidneys; I've had 4" (01/21/2016)  . Hypertension   . Migraine    "qod now" (01/21/2016)  . Preterm labor ~ 2014  . Renal insufficiency   . Seizures ([place])    "last one was in 2000; related to preeclampsia" (01/21/2016)    Patient Active Problem List   Diagnosis Date Noted  . Fever 12/07/2017  . Abdominal pain 08/03/2016  . Chronic pain 01/21/2016  . Muscle cramps 03/29/2015  . Anemia of chronic kidney failure 09/15/2013  . Cocaine abuse ([place]) 08/26/2013  . Depression 04/21/2012  . ESRD (end stage renal disease) ([place]) 08/03/2011    Past Surgical History:  Procedure Laterality Date  . AV FISTULA PLACEMENT Left 2010    . CERVICAL BIOPSY  W/ LOOP ELECTRODE EXCISION  2001  . DILATION AND EVACUATION  08/02/2011   Procedure: DILATATION AND EVACUATION;  Surgeon: Logan Bores, MD;  Location: [facility];  Service: Gynecology;;  . Brigitte Pulse AND EVACUATION N/A 08/31/2013   Procedure: DILATATION AND EVACUATION;  Surgeon: Woodroe Mode, MD;  Location: [facility];  Service: Gynecology;  Laterality: N/A;  . RENAL BIOPSY    . REVISION OF ARTERIOVENOUS GORETEX GRAFT Left 02/11/2013   Procedure: REVISION OF ARTERIOVENOUS GORTEX FISTULA;  Surgeon: Rosetta Posner, MD;  Location: [facility] OR;  Service: Vascular;  Laterality: Left;     OB History    Gravida  6   Para  1   Term  1   Preterm  0   AB  4   Living  1     SAB  2   TAB  2   Ectopic  0   Multiple  0   Live Births  1            Home Medications    Prior to Admission medications   Medication Sig Start Date End Date Taking? Authorizing Provider  albuterol (PROVENTIL HFA;VENTOLIN HFA) 108 (90 Base) MCG/ACT inhaler Inhale 2 puffs into the lungs every 4 (four) hours as needed for wheezing or shortness of breath. 05/02/16   Oleta Mouse,  Eugenia Mcalpine, MD  calcium acetate (PHOSLO) 667 MG capsule Take 1,334 mg by mouth 3 (three) times daily with meals. 11/30/17   [provider]  cinacalcet (SENSIPAR) 30 MG tablet Take 60 mg by mouth every evening.     [provider]  diphenhydrAMINE (BENADRYL) 25 MG tablet Take 1 tablet (25 mg total) by mouth every 6 (six) hours. 09/05/17   Domenic Moras, PA-C  famotidine (PEPCID) 20 MG tablet Take 1 tablet (20 mg total) by mouth 2 (two) times daily. 09/05/17   Domenic Moras, PA-C  ondansetron (ZOFRAN ODT) 4 MG disintegrating tablet Take 1 tablet (4 mg total) by mouth every 8 (eight) hours as needed for nausea or vomiting. 12/09/17   Rai, Vernelle Emerald, MD  promethazine (PHENERGAN) 25 MG suppository Place 1 suppository (25 mg total) rectally every 6 (six) hours as needed for nausea or vomiting. 04/12/18   Deno Etienne, DO  sevelamer  carbonate (RENVELA) 800 MG tablet Take 2,400 mg by mouth 3 (three) times daily with meals.    [provider]  sodium bicarbonate 325 MG tablet Take 325 mg by mouth 2 (two) times daily.    [provider]  sulfacetamide (BLEPH-10) 10 % ophthalmic solution Place 1-2 drops into both eyes every 6 (six) hours. 05/20/18   Delia Heady, PA-C    Family History Family History  Problem Relation Age of Onset  . Diabetes Mother   . Hyperlipidemia Mother   . Hypertension Mother   . Heart disease Mother   . Hypertension Other   . Diabetes type II Other     Social History Social History   Tobacco Use  . Smoking status: Passive Smoke Exposure - Never Smoker  . Smokeless tobacco: Never Used  Substance Use Topics  . Alcohol use: No  . Drug use: Yes    Types: Marijuana    Comment: 01/21/2016 "maybe 3 times/week"     Allergies   Morphine; Prednisone; [place] [fish allergy]; Amlodipine; Iodinated diagnostic agents; and Tape   Review of Systems Review of Systems  Constitutional: Negative for fever.  Eyes: Positive for discharge, redness and itching. Negative for photophobia, pain and visual disturbance.  Skin: Negative for rash.     Physical Exam Updated Vital Signs BP 140/79 (BP Location: Right Arm)   Pulse (!) 106   Temp 99.2 F (37.3 C) (Oral)   Resp 18   Ht 5\' 5"  (1.651 m)   LMP 05/01/2018   SpO2 99%   BMI 31.73 kg/m   Physical Exam Vitals signs and nursing note reviewed.  Constitutional:      General: She is not in acute distress.    Appearance: She is well-developed. She is not diaphoretic.  HENT:     Head: Normocephalic and atraumatic.  Eyes:     General: No scleral icterus.       Right eye: Hordeolum present.     Extraocular Movements: Extraocular movements intact.     Right eye: Normal extraocular motion.     Left eye: Normal extraocular motion.     Conjunctiva/sclera:     Right eye: Right conjunctiva is injected.     Left eye: Left conjunctiva is  injected.     Pupils: Pupils are equal, round, and reactive to light.     Comments: R eye with injected conjunctiva, stye noted on upper eyelid; no erythema or tenderness to palpation.  Mild clear tearful drainage noted.  No foreign bodies noted.  No pain with EOMs.  No chemosis,  proptosis, or consensual photophobia.  Neck:     Musculoskeletal: Normal range of motion.  Pulmonary:     Effort: Pulmonary effort is normal. No respiratory distress.  Skin:    Findings: No rash.  Neurological:     Mental Status: She is alert.      ED Treatments / Results  Labs (all labs ordered are listed, but only abnormal results are displayed) Labs Reviewed - No data to display  EKG None  Radiology No results found.  Procedures Procedures (including critical care time)  Medications Ordered in ED Medications - No data to display   Initial Impression / Assessment and Plan / ED Course  I have reviewed the triage vital signs and the nursing notes.  Pertinent labs & imaging results that were available during my care of the patient were reviewed by me and considered in my medical decision making (see chart for details).        43 year old female with past medical history of ESRD on dialysis presents to ED for right eye tearing, purulent drainage, irritation and stye.  Symptoms have been present for 3 days.  Denies any trauma to the area, vision changes, pain with EOMs, proptosis or foreign body sensation.  She is beginning to have similar symptoms in her left eye.  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  She wears corrective lenses with no changes from her baseline vision.  Visual acuity 20/200 in R eye but patient does not have glasses with her. Findings consistent with conjunctivitis of the right eye with stye.  Doubt corneal abrasion, iritis, keratitis or other emergent cause of her symptoms.  Will give sulfacetamide drops and have her follow-up with ophthalmologist.  Patient is  hemodynamically stable, in NAD, and able to ambulate in the ED. Evaluation does not show pathology that would require ongoing emergent intervention or inpatient treatment. I explained the diagnosis to the patient. Pain has been managed and has no complaints prior to discharge. Patient is comfortable with above plan and is stable for discharge at this time. All questions were answered prior to disposition. Strict return precautions for returning to the ED were discussed. Encouraged follow up with PCP.    Portions of this note were generated with [employer]. Dictation errors may occur despite best attempts at proofreading.   Final Clinical Impressions(s) / ED Diagnoses   Final diagnoses:  Acute conjunctivitis of right eye, unspecified acute conjunctivitis type  Hordeolum externum of right upper eyelid    ED Discharge Orders         Ordered    sulfacetamide (BLEPH-10) 10 % ophthalmic solution  Every 6 hours     05/20/18 1933           Delia Heady, PA-C 05/20/18 [place], [place], DO 05/20/18 2225

## 2018-05-20 NOTE — Discharge Instructions (Addendum)
Follow-up with your eye doctor. Use eyedrops as directed. Return to the ED if you start to have more swelling around your eye, pain with moving your eye, trauma to the area or foreign body sensation.

## 2018-05-25 ENCOUNTER — Encounter (HOSPITAL_COMMUNITY): Admission: EM | Disposition: A | Discharge status: Home / Self Care | Payer: Self-pay | Source: Home / Self Care

## 2018-05-25 ENCOUNTER — Inpatient Hospital Stay: Admit: 2018-05-25 | Payer: Medicare Other | Admitting: Surgery

## 2018-05-25 ENCOUNTER — Emergency Department (HOSPITAL_COMMUNITY): Payer: Medicare Other | Admitting: Anesthesiology

## 2018-05-25 ENCOUNTER — Other Ambulatory Visit: Payer: Self-pay

## 2018-05-25 ENCOUNTER — Emergency Department (HOSPITAL_COMMUNITY)
Admission: EM | Admit: 2018-05-25 | Discharge: 2018-05-25 | Disposition: A | Discharge status: Home / Self Care | Payer: Medicare Other | Attending: Surgery | Admitting: Surgery

## 2018-05-25 ENCOUNTER — Encounter (HOSPITAL_COMMUNITY): Payer: Self-pay

## 2018-05-25 DIAGNOSIS — Z6831 Body mass index (BMI) 31.0-31.9, adult: Secondary | ICD-10-CM | POA: Insufficient documentation

## 2018-05-25 DIAGNOSIS — R569 Unspecified convulsions: Secondary | ICD-10-CM | POA: Insufficient documentation

## 2018-05-25 DIAGNOSIS — I120 Hypertensive chronic kidney disease with stage 5 chronic kidney disease or end stage renal disease: Secondary | ICD-10-CM | POA: Insufficient documentation

## 2018-05-25 DIAGNOSIS — T82838A Hemorrhage of vascular prosthetic devices, implants and grafts, initial encounter: Secondary | ICD-10-CM

## 2018-05-25 DIAGNOSIS — J45909 Unspecified asthma, uncomplicated: Secondary | ICD-10-CM | POA: Insufficient documentation

## 2018-05-25 DIAGNOSIS — M1990 Unspecified osteoarthritis, unspecified site: Secondary | ICD-10-CM | POA: Insufficient documentation

## 2018-05-25 DIAGNOSIS — K219 Gastro-esophageal reflux disease without esophagitis: Secondary | ICD-10-CM | POA: Insufficient documentation

## 2018-05-25 DIAGNOSIS — R51 Headache: Secondary | ICD-10-CM | POA: Diagnosis not present

## 2018-05-25 DIAGNOSIS — Z992 Dependence on renal dialysis: Secondary | ICD-10-CM | POA: Insufficient documentation

## 2018-05-25 DIAGNOSIS — N186 End stage renal disease: Secondary | ICD-10-CM | POA: Diagnosis present

## 2018-05-25 DIAGNOSIS — F419 Anxiety disorder, unspecified: Secondary | ICD-10-CM | POA: Diagnosis not present

## 2018-05-25 DIAGNOSIS — E669 Obesity, unspecified: Secondary | ICD-10-CM | POA: Insufficient documentation

## 2018-05-25 DIAGNOSIS — T82898A Other specified complication of vascular prosthetic devices, implants and grafts, initial encounter: Secondary | ICD-10-CM | POA: Diagnosis not present

## 2018-05-25 DIAGNOSIS — F329 Major depressive disorder, single episode, unspecified: Secondary | ICD-10-CM | POA: Insufficient documentation

## 2018-05-25 HISTORY — DX: Anxiety disorder, unspecified: F41.9

## 2018-05-25 HISTORY — DX: Round hole, left eye: H33.322

## 2018-05-25 HISTORY — DX: Anemia, unspecified: D64.9

## 2018-05-25 HISTORY — PX: THROMBECTOMY W/ EMBOLECTOMY: SHX2507

## 2018-05-25 HISTORY — DX: Unspecified osteoarthritis, unspecified site: M19.90

## 2018-05-25 LAB — POCT I-STAT 4, (NA,K, GLUC, HGB,HCT)
Glucose, Bld: 99 mg/dL (ref 70–99)
HCT: 26 % — ABNORMAL LOW (ref 36.0–46.0)
Hemoglobin: 8.8 g/dL — ABNORMAL LOW (ref 12.0–15.0)
Potassium: 4.3 mmol/L (ref 3.5–5.1)
Sodium: 135 mmol/L (ref 135–145)

## 2018-05-25 LAB — HCG, SERUM, QUALITATIVE: Preg, Serum: NEGATIVE

## 2018-05-25 SURGERY — THROMBECTOMY ARTERIOVENOUS FISTULA
Anesthesia: General | Site: Arm Lower | Laterality: Left

## 2018-05-25 MED ORDER — OXYCODONE HCL 5 MG PO TABS
5.0000 mg | ORAL_TABLET | Freq: Once | ORAL | Status: AC | PRN
  Administered 2018-05-25: 5 mg via ORAL

## 2018-05-25 MED ORDER — OXYCODONE HCL 10 MG PO TABS: 10.0000 mg | ORAL_TABLET | Freq: Three times a day (TID) | ORAL | 0 refills | Status: DC | PRN

## 2018-05-25 MED ORDER — FENTANYL CITRATE (PF) 100 MCG/2ML IJ SOLN
INTRAMUSCULAR | Status: DC | PRN
  Administered 2018-05-25 (×3): 25 ug via INTRAVENOUS
  Administered 2018-05-25: 50 ug via INTRAVENOUS
  Administered 2018-05-25: 25 ug via INTRAVENOUS
  Administered 2018-05-25 (×2): 50 ug via INTRAVENOUS

## 2018-05-25 MED ORDER — OXYCODONE HCL 5 MG PO TABS
ORAL_TABLET | ORAL | Status: AC
  Filled 2018-05-25: qty 1

## 2018-05-25 MED ORDER — LIDOCAINE-EPINEPHRINE 1 %-1:100000 IJ SOLN
INTRAMUSCULAR | Status: AC
  Filled 2018-05-25: qty 1

## 2018-05-25 MED ORDER — SODIUM CHLORIDE 0.9 % IV SOLN
INTRAVENOUS | Status: AC
  Filled 2018-05-25: qty 1.2

## 2018-05-25 MED ORDER — 0.9 % SODIUM CHLORIDE (POUR BTL) OPTIME
TOPICAL | Status: DC | PRN
  Administered 2018-05-25: 1000 mL

## 2018-05-25 MED ORDER — MIDAZOLAM HCL 2 MG/2ML IJ SOLN
INTRAMUSCULAR | Status: DC | PRN
  Administered 2018-05-25: 1 mg via INTRAVENOUS

## 2018-05-25 MED ORDER — ONDANSETRON HCL 4 MG/2ML IJ SOLN
INTRAMUSCULAR | Status: AC
  Filled 2018-05-25: qty 2

## 2018-05-25 MED ORDER — FENTANYL CITRATE (PF) 250 MCG/5ML IJ SOLN
INTRAMUSCULAR | Status: AC
  Filled 2018-05-25: qty 5

## 2018-05-25 MED ORDER — FENTANYL CITRATE (PF) 100 MCG/2ML IJ SOLN
INTRAMUSCULAR | Status: AC
  Filled 2018-05-25: qty 2

## 2018-05-25 MED ORDER — ONDANSETRON HCL 4 MG/2ML IJ SOLN
4.0000 mg | Freq: Four times a day (QID) | INTRAMUSCULAR | Status: AC | PRN
  Administered 2018-05-25: 4 mg via INTRAVENOUS

## 2018-05-25 MED ORDER — SODIUM CHLORIDE 0.9 % IV SOLN
INTRAVENOUS | Status: DC
  Administered 2018-05-25 (×2): via INTRAVENOUS

## 2018-05-25 MED ORDER — MIDAZOLAM HCL 2 MG/2ML IJ SOLN
INTRAMUSCULAR | Status: AC
  Filled 2018-05-25: qty 2

## 2018-05-25 MED ORDER — OXYCODONE HCL 5 MG/5ML PO SOLN: 5.0000 mg | Freq: Once | ORAL | Status: AC | PRN

## 2018-05-25 MED ORDER — ONDANSETRON HCL 4 MG/2ML IJ SOLN
INTRAMUSCULAR | Status: DC | PRN
  Administered 2018-05-25: 4 mg via INTRAVENOUS

## 2018-05-25 MED ORDER — LIDOCAINE HCL (CARDIAC) PF 100 MG/5ML IV SOSY
PREFILLED_SYRINGE | INTRAVENOUS | Status: DC | PRN
  Administered 2018-05-25: 60 mg via INTRAVENOUS

## 2018-05-25 MED ORDER — CEFAZOLIN SODIUM-DEXTROSE 2-4 GM/100ML-% IV SOLN
1.0000 g | Freq: Once | INTRAVENOUS | Status: AC
  Administered 2018-05-25: 1 g via INTRAVENOUS
  Filled 2018-05-25: qty 100

## 2018-05-25 MED ORDER — FENTANYL CITRATE (PF) 100 MCG/2ML IJ SOLN
25.0000 ug | INTRAMUSCULAR | Status: DC | PRN
  Administered 2018-05-25: 25 ug via INTRAVENOUS

## 2018-05-25 MED ORDER — PROPOFOL 10 MG/ML IV BOLUS
INTRAVENOUS | Status: DC | PRN
  Administered 2018-05-25: 150 mg via INTRAVENOUS

## 2018-05-25 SURGICAL SUPPLY — 36 items
ARMBAND PINK RESTRICT EXTREMIT (MISCELLANEOUS) ×4 IMPLANT
CANISTER SUCT 3000ML PPV (MISCELLANEOUS) ×3 IMPLANT
CATH EMB 4FR 80CM (CATHETERS) ×1 IMPLANT
CLIP VESOCCLUDE MED 6/CT (CLIP) ×3 IMPLANT
CLIP VESOCCLUDE SM WIDE 6/CT (CLIP) ×3 IMPLANT
COVER WAND RF STERILE (DRAPES) ×1 IMPLANT
CUFF TOURN SGL QUICK 24 (TOURNIQUET CUFF) ×2
CUFF TRNQT CYL 24X4X16.5-23 (TOURNIQUET CUFF) IMPLANT
DERMABOND ADVANCED (GAUZE/BANDAGES/DRESSINGS) ×2
DERMABOND ADVANCED .7 DNX12 (GAUZE/BANDAGES/DRESSINGS) ×1 IMPLANT
DRAPE X-RAY CASS 24X20 (DRAPES) IMPLANT
ELECT REM PT RETURN 9FT ADLT (ELECTROSURGICAL) ×3
ELECTRODE REM PT RTRN 9FT ADLT (ELECTROSURGICAL) ×1 IMPLANT
GLOVE BIOGEL PI IND STRL 7.5 (GLOVE) ×1 IMPLANT
GLOVE BIOGEL PI INDICATOR 7.5 (GLOVE) ×2
GLOVE SURG SS PI 7.5 STRL IVOR (GLOVE) ×3 IMPLANT
GOWN STRL REUS W/ TWL LRG LVL3 (GOWN DISPOSABLE) ×2 IMPLANT
GOWN STRL REUS W/ TWL XL LVL3 (GOWN DISPOSABLE) ×1 IMPLANT
GOWN STRL REUS W/TWL LRG LVL3 (GOWN DISPOSABLE) ×4
GOWN STRL REUS W/TWL XL LVL3 (GOWN DISPOSABLE) ×2
HEMOSTAT SNOW SURGICEL 2X4 (HEMOSTASIS) IMPLANT
KIT BASIN OR (CUSTOM PROCEDURE TRAY) ×3 IMPLANT
KIT TURNOVER KIT B (KITS) ×3 IMPLANT
NS IRRIG 1000ML POUR BTL (IV SOLUTION) ×3 IMPLANT
PACK CV ACCESS (CUSTOM PROCEDURE TRAY) ×3 IMPLANT
PAD ARMBOARD 7.5X6 YLW CONV (MISCELLANEOUS) ×6 IMPLANT
SET COLLECT BLD 21X3/4 12 (NEEDLE) IMPLANT
STOPCOCK 4 WAY LG BORE MALE ST (IV SETS) IMPLANT
SUT PROLENE 6 0 BV (SUTURE) ×3 IMPLANT
SUT VIC AB 3-0 SH 27 (SUTURE) ×2
SUT VIC AB 3-0 SH 27X BRD (SUTURE) ×1 IMPLANT
SUT VICRYL 4-0 PS2 18IN ABS (SUTURE) ×2 IMPLANT
TOWEL GREEN STERILE (TOWEL DISPOSABLE) ×3 IMPLANT
TUBING EXTENTION W/L.L. (IV SETS) IMPLANT
UNDERPAD 30X30 (UNDERPADS AND DIAPERS) ×3 IMPLANT
WATER STERILE IRR 1000ML POUR (IV SOLUTION) ×3 IMPLANT

## 2018-05-25 NOTE — Anesthesia Postprocedure Evaluation (Signed)
Anesthesia Post Note  Patient: Desiree [name]  Procedure(s) Performed: REPAIR OF BLEEDING ARTERIOVENOUS FISTULA (Left Arm Lower)     Patient location during evaluation: PACU Anesthesia Type: General Level of consciousness: awake and alert Pain management: pain level controlled Vital Signs Assessment: post-procedure vital signs reviewed and stable Respiratory status: spontaneous breathing, nonlabored ventilation, respiratory function stable and patient connected to nasal cannula oxygen Cardiovascular status: blood pressure returned to baseline and stable Postop Assessment: no apparent nausea or vomiting Anesthetic complications: no    Last Vitals:  Vitals:   05/25/18 2017 05/25/18 2032  BP: (!) 173/94 (!) 174/98  Pulse: 90 89  Resp: (!) 24 (!) 22  Temp: 37.2 C   SpO2: 95% 97%    Last Pain:  Vitals:   05/25/18 1847  TempSrc:   PainSc: 10-Worst pain ever                 Tiajuana Amass

## 2018-05-25 NOTE — ED Triage Notes (Addendum)
Pt states she was sent over to see vascular surgeon d/t access problem. Family states her graft has been bleeding on and off. Pt reports pain in the arm. No bleeding at this time. Pt here to see Dr. Trula Slade

## 2018-05-25 NOTE — Op Note (Signed)
    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  The underlying vein appeared healthy and so I closed the hole with a 6-0 Prolene  Indications: The patient has been having bleeding from a buttonhole for about 2 months.  Her fistula was placed in 2010.  She comes in today for repair.  Procedure:  The patient was identified in the holding area and taken to [facility]  The patient was then placed supine on the table. general anesthesia was administered.  The patient was prepped and draped in the usual sterile fashion.  A time out was called and antibiotics were administered.  I placed a tourniquet on the upper arm however never inflated.  I made an elliptical incision around the buttonhole that have been persistently bleeding.  Cautery was used to divide the subcutaneous tissue until I had exposed the vein on either side of the area of concern.  The fistula was then occluded with a vascular clamp and I used a 15 blade to transect the overlying skin and subcutaneous tissue.  This left a small approximately 1 mm hole within the vein.  The underlying venous tissue was very healthy.  Some of the tissue was excised.  I then closed the hole with a horizontal mattress 6-0 Prolene suture.  The clamp was released.  There was good hemostasis and a persistent thrill within the fistula.  The wound was irrigated.  The subcutaneous tissue was closed with 3-0 Vicryl, and the skin was closed with 3-0 Vicryl followed by Dermabond.  There were no immediate  complications.   Disposition: To PACU stable   V. Annamarie Major, M.D. [facility] Office: [phone] Pager:  [phone]

## 2018-05-25 NOTE — Anesthesia Preprocedure Evaluation (Signed)
Anesthesia Evaluation  Patient identified by MRN, date of birth, ID band Patient awake    Reviewed: Allergy & Precautions, H&P , NPO status , Patient's Chart, lab work & pertinent test results  Airway Mallampati: II   Neck ROM: full    Dental   Pulmonary asthma ,    breath sounds clear to auscultation       Cardiovascular hypertension,  Rhythm:regular Rate:Normal     Neuro/Psych  Headaches, Seizures -,  PSYCHIATRIC DISORDERS Anxiety Depression    GI/Hepatic GERD  ,  Endo/Other  obese  Renal/GU ESRF and DialysisRenal disease     Musculoskeletal  (+) Arthritis ,   Abdominal   Peds  Hematology   Anesthesia Other Findings   Reproductive/Obstetrics                             Anesthesia Physical Anesthesia Plan  ASA: III  Anesthesia Plan: General   Post-op Pain Management:    Induction: Intravenous  PONV Risk Score and Plan: 3 and Ondansetron, Dexamethasone and Treatment may vary due to age or medical condition  Airway Management Planned: LMA  Additional Equipment:   Intra-op Plan:   Post-operative Plan:   Informed Consent: I have reviewed the patients History and Physical, chart, labs and discussed the procedure including the risks, benefits and alternatives for the proposed anesthesia with the patient or authorized representative who has indicated his/her understanding and acceptance.       Plan Discussed with: CRNA, Anesthesiologist and Surgeon  Anesthesia Plan Comments:         Anesthesia Quick Evaluation

## 2018-05-25 NOTE — H&P (Signed)
   Patient name: Desiree [name] MRN: [id number] DOB: 22-Jun-1975 Sex: female   HISTORY OF PRESENT ILLNESS:   KRISSA UTKE is a 43 y.o. female with end-stage renal disease who has a left radiocephalic fistula created in 2010.  She has undergone repair for bleeding in the past.  She has not had any intervention by [place] since 2017.  She has been having bleeding from a buttonhole site.  She comes in today for treatment.  CURRENT MEDICATIONS:    No current facility-administered medications for this encounter.     REVIEW OF SYSTEMS:   [X]  denotes positive finding, [ ]  denotes negative finding Cardiac  Comments:  Chest pain or chest pressure:    Shortness of breath upon exertion:    Short of breath when lying flat:    Irregular heart rhythm:    Constitutional    Fever or chills:      PHYSICAL EXAM:   Vitals:   05/25/18 1427  BP: (!) 157/95  Pulse: 97  Resp: 18  SpO2: 96%    GENERAL: The patient is a well-nourished female, in no acute distress. The vital signs are documented above. CARDIOVASCULAR: There is a regular rate and rhythm. PULMONARY: Non-labored respirations Fistula is not actively bleeding  STUDIES:   None   MEDICAL ISSUES:   Plan to go the operating room for repair of bleeding the fistula.  Hopefully, I can just excise the button hole site.  Annamarie Major, MD [facility] [phone] Pager [phone]

## 2018-05-25 NOTE — Discharge Instructions (Signed)
° °  [facility] ° °Discharge Instructions ° °AV Fistula or Graft Surgery for Dialysis Access ° °Please refer to the following instructions for your post-procedure care. Your surgeon or physician assistant will discuss any changes with you. ° °Activity ° °You may drive the day following your surgery, if you are comfortable and no longer taking prescription pain medication. Resume full activity as the soreness in your incision resolves. ° °Bathing/Showering ° °You may shower after you go home. Keep your incision dry for 48 hours. Do not soak in a bathtub, hot tub, or swim until the incision heals completely. You may not shower if you have a hemodialysis catheter. ° °Incision Care ° °Clean your incision with mild soap and water after 48 hours. Pat the area dry with a clean towel. You do not need a bandage unless otherwise instructed. Do not apply any ointments or creams to your incision. You may have skin glue on your incision. Do not peel it off. It will come off on its own in about one week. Your arm may swell a bit after surgery. To reduce swelling use pillows to elevate your arm so it is above your heart. Your doctor will tell you if you need to lightly wrap your arm with an ACE bandage. ° °Diet ° °Resume your normal diet. There are not special food restrictions following this procedure. In order to heal from your surgery, it is CRITICAL to get adequate nutrition. Your body requires vitamins, minerals, and protein. Vegetables are the best source of vitamins and minerals. Vegetables also provide the perfect balance of protein. Processed food has little nutritional value, so try to avoid this. ° °Medications ° °Resume taking all of your medications. If your incision is causing pain, you may take over-the counter pain relievers such as acetaminophen (Tylenol). If you were prescribed a stronger pain medication, please be aware these medications can cause nausea and constipation. Prevent  nausea by taking the medication with a snack or meal. Avoid constipation by drinking plenty of fluids and eating foods with high amount of fiber, such as fruits, vegetables, and grains. Do not take Tylenol if you are taking prescription pain medications. ° ° ° ° °Follow up °Your surgeon may want to see you in the office following your access surgery. If so, this will be arranged at the time of your surgery. ° °Please call us immediately for any of the following conditions: ° °Increased pain, redness, drainage (pus) from your incision site °Fever of 101 degrees or higher °Severe or worsening pain at your incision site °Hand pain or numbness. ° °Reduce your risk of vascular disease: ° °Stop smoking. If you would like help, call [facility] at 1-800-QUIT-NOW (1-800-784-8669) or [place] at 336-586-4000 ° °Manage your cholesterol °Maintain a desired weight °Control your diabetes °Keep your blood pressure down ° °Dialysis ° °It will take several weeks to several months for your new dialysis access to be ready for use. Your surgeon will determine when it is OK to use it. Your nephrologist will continue to direct your dialysis. You can continue to use your Permcath until your new access is ready for use. ° °If you have any questions, please call the office at [phone]. ° °

## 2018-05-25 NOTE — Transfer of Care (Signed)
Immediate Anesthesia Transfer of Care Note  Patient: Desiree [name]  Procedure(s) Performed: REPAIR OF BLEEDING ARTERIOVENOUS FISTULA (Left Arm Lower)  Patient Location: PACU  Anesthesia Type:General  Level of Consciousness: awake and drowsy  Airway & Oxygen Therapy: Patient Spontanous Breathing and Patient connected to nasal cannula oxygen  Post-op Assessment: Report given to RN and Post -op Vital signs reviewed and stable  Post vital signs: Reviewed and stable  Last Vitals:  Vitals Value Taken Time  BP 164/97 05/25/2018  6:47 PM  Temp    Pulse 100 05/25/2018  6:49 PM  Resp 19 05/25/2018  6:49 PM  SpO2 93 % 05/25/2018  6:49 PM  Vitals shown include unvalidated device data.  Last Pain:  Vitals:   05/25/18 1522  TempSrc:   PainSc: 8       Patients Stated Pain Goal: 3 (94/94/47 3958)  Complications: No apparent anesthesia complications

## 2018-05-25 NOTE — Anesthesia Procedure Notes (Signed)
Procedure Name: LMA Insertion Date/Time: 05/25/2018 5:59 PM Performed by: Inda Coke, CRNA Pre-anesthesia Checklist: Patient identified, Emergency Drugs available, Suction available and Patient being monitored Patient Re-evaluated:Patient Re-evaluated prior to induction Oxygen Delivery Method: Circle System Utilized Preoxygenation: Pre-oxygenation with 100% oxygen Induction Type: IV induction Ventilation: Mask ventilation without difficulty LMA: LMA inserted LMA Size: 4.0 Number of attempts: 1 Airway Equipment and Method: Bite block Placement Confirmation: positive ETCO2 Tube secured with: Tape Dental Injury: Teeth and Oropharynx as per pre-operative assessment

## 2018-05-26 ENCOUNTER — Encounter (HOSPITAL_COMMUNITY): Payer: Self-pay | Admitting: Surgery

## 2018-06-02 ENCOUNTER — Encounter (HOSPITAL_COMMUNITY): Payer: Self-pay

## 2018-06-02 ENCOUNTER — Other Ambulatory Visit: Payer: Self-pay

## 2018-06-02 ENCOUNTER — Inpatient Hospital Stay (HOSPITAL_COMMUNITY)
Admission: EM | Admit: 2018-06-02 | Discharge: 2018-06-04 | DRG: 291 | Disposition: A | Discharge status: Home / Self Care | Payer: Medicare Other | Attending: Internal Medicine | Admitting: Internal Medicine

## 2018-06-02 ENCOUNTER — Emergency Department (HOSPITAL_COMMUNITY): Payer: Medicare Other

## 2018-06-02 DIAGNOSIS — R0602 Shortness of breath: Secondary | ICD-10-CM | POA: Diagnosis not present

## 2018-06-02 DIAGNOSIS — E8779 Other fluid overload: Secondary | ICD-10-CM

## 2018-06-02 DIAGNOSIS — F419 Anxiety disorder, unspecified: Secondary | ICD-10-CM | POA: Diagnosis present

## 2018-06-02 DIAGNOSIS — I509 Heart failure, unspecified: Secondary | ICD-10-CM | POA: Diagnosis present

## 2018-06-02 DIAGNOSIS — Z79899 Other long term (current) drug therapy: Secondary | ICD-10-CM

## 2018-06-02 DIAGNOSIS — J45909 Unspecified asthma, uncomplicated: Secondary | ICD-10-CM | POA: Diagnosis present

## 2018-06-02 DIAGNOSIS — Z91013 Allergy to seafood: Secondary | ICD-10-CM

## 2018-06-02 DIAGNOSIS — I132 Hypertensive heart and chronic kidney disease with heart failure and with stage 5 chronic kidney disease, or end stage renal disease: Principal | ICD-10-CM | POA: Diagnosis present

## 2018-06-02 DIAGNOSIS — Z91041 Radiographic dye allergy status: Secondary | ICD-10-CM

## 2018-06-02 DIAGNOSIS — Z888 Allergy status to other drugs, medicaments and biological substances status: Secondary | ICD-10-CM

## 2018-06-02 DIAGNOSIS — J9600 Acute respiratory failure, unspecified whether with hypoxia or hypercapnia: Secondary | ICD-10-CM | POA: Diagnosis present

## 2018-06-02 DIAGNOSIS — N186 End stage renal disease: Secondary | ICD-10-CM | POA: Diagnosis not present

## 2018-06-02 DIAGNOSIS — D631 Anemia in chronic kidney disease: Secondary | ICD-10-CM | POA: Diagnosis not present

## 2018-06-02 DIAGNOSIS — E875 Hyperkalemia: Secondary | ICD-10-CM | POA: Diagnosis present

## 2018-06-02 DIAGNOSIS — J9601 Acute respiratory failure with hypoxia: Secondary | ICD-10-CM | POA: Diagnosis present

## 2018-06-02 DIAGNOSIS — Z992 Dependence on renal dialysis: Secondary | ICD-10-CM

## 2018-06-02 DIAGNOSIS — G43909 Migraine, unspecified, not intractable, without status migrainosus: Secondary | ICD-10-CM | POA: Diagnosis present

## 2018-06-02 DIAGNOSIS — M109 Gout, unspecified: Secondary | ICD-10-CM | POA: Diagnosis present

## 2018-06-02 DIAGNOSIS — K219 Gastro-esophageal reflux disease without esophagitis: Secondary | ICD-10-CM | POA: Diagnosis present

## 2018-06-02 DIAGNOSIS — Z9109 Other allergy status, other than to drugs and biological substances: Secondary | ICD-10-CM

## 2018-06-02 DIAGNOSIS — N185 Chronic kidney disease, stage 5: Secondary | ICD-10-CM | POA: Diagnosis not present

## 2018-06-02 DIAGNOSIS — Z885 Allergy status to narcotic agent status: Secondary | ICD-10-CM

## 2018-06-02 DIAGNOSIS — M170 Bilateral primary osteoarthritis of knee: Secondary | ICD-10-CM | POA: Diagnosis present

## 2018-06-02 DIAGNOSIS — N189 Chronic kidney disease, unspecified: Secondary | ICD-10-CM

## 2018-06-02 DIAGNOSIS — N2581 Secondary hyperparathyroidism of renal origin: Secondary | ICD-10-CM | POA: Diagnosis present

## 2018-06-02 DIAGNOSIS — Z8249 Family history of ischemic heart disease and other diseases of the circulatory system: Secondary | ICD-10-CM

## 2018-06-02 DIAGNOSIS — E8889 Other specified metabolic disorders: Secondary | ICD-10-CM | POA: Diagnosis present

## 2018-06-02 LAB — CBC WITH DIFFERENTIAL/PLATELET
Abs Immature Granulocytes: 0.04 10*3/uL (ref 0.00–0.07)
Basophils Absolute: 0 10*3/uL (ref 0.0–0.1)
Basophils Relative: 0 %
EOS ABS: 0.2 10*3/uL (ref 0.0–0.5)
Eosinophils Relative: 3 %
HCT: 27.6 % — ABNORMAL LOW (ref 36.0–46.0)
Hemoglobin: 8.2 g/dL — ABNORMAL LOW (ref 12.0–15.0)
Immature Granulocytes: 1 %
Lymphocytes Relative: 16 %
Lymphs Abs: 1.3 10*3/uL (ref 0.7–4.0)
MCH: 26.5 pg (ref 26.0–34.0)
MCHC: 29.7 g/dL — ABNORMAL LOW (ref 30.0–36.0)
MCV: 89 fL (ref 80.0–100.0)
Monocytes Absolute: 0.4 10*3/uL (ref 0.1–1.0)
Monocytes Relative: 5 %
NEUTROS PCT: 75 %
Neutro Abs: 5.7 10*3/uL (ref 1.7–7.7)
Platelets: 311 10*3/uL (ref 150–400)
RBC: 3.1 MIL/uL — ABNORMAL LOW (ref 3.87–5.11)
RDW: 20.4 % — ABNORMAL HIGH (ref 11.5–15.5)
WBC: 7.7 10*3/uL (ref 4.0–10.5)
nRBC: 0 % (ref 0.0–0.2)

## 2018-06-02 LAB — COMPREHENSIVE METABOLIC PANEL
ALT: 5 U/L (ref 0–44)
AST: 12 U/L — ABNORMAL LOW (ref 15–41)
Albumin: 3.7 g/dL (ref 3.5–5.0)
Alkaline Phosphatase: 50 U/L (ref 38–126)
Anion gap: 18 — ABNORMAL HIGH (ref 5–15)
BUN: 71 mg/dL — ABNORMAL HIGH (ref 6–20)
CO2: 21 mmol/L — ABNORMAL LOW (ref 22–32)
Calcium: 9.8 mg/dL (ref 8.9–10.3)
Chloride: 96 mmol/L — ABNORMAL LOW (ref 98–111)
Creatinine, Ser: 21.32 mg/dL — ABNORMAL HIGH (ref 0.44–1.00)
GFR calc Af Amer: 2 mL/min — ABNORMAL LOW (ref >60)
GFR calc non Af Amer: 2 mL/min — ABNORMAL LOW (ref >60)
Glucose, Bld: 85 mg/dL (ref 70–99)
POTASSIUM: 6.4 mmol/L — AB (ref 3.5–5.1)
Sodium: 135 mmol/L (ref 135–145)
Total Bilirubin: 0.6 mg/dL (ref 0.3–1.2)
Total Protein: 8.6 g/dL — ABNORMAL HIGH (ref 6.5–8.1)

## 2018-06-02 LAB — I-STAT TROPONIN, ED: Troponin i, poc: 0.01 ng/mL (ref 0.00–0.08)

## 2018-06-02 MED ORDER — OXYCODONE HCL 5 MG PO TABS
10.0000 mg | ORAL_TABLET | Freq: Three times a day (TID) | ORAL | Status: DC | PRN
  Administered 2018-06-02 – 2018-06-04 (×3): 10 mg via ORAL
  Filled 2018-06-02 (×3): qty 2

## 2018-06-02 MED ORDER — ALBUTEROL SULFATE (2.5 MG/3ML) 0.083% IN NEBU
5.0000 mg | INHALATION_SOLUTION | Freq: Once | RESPIRATORY_TRACT | Status: DC
  Filled 2018-06-02: qty 6

## 2018-06-02 MED ORDER — ONDANSETRON HCL 4 MG PO TABS
4.0000 mg | ORAL_TABLET | Freq: Four times a day (QID) | ORAL | Status: DC | PRN
  Administered 2018-06-02 – 2018-06-03 (×2): 4 mg via ORAL
  Filled 2018-06-02 (×2): qty 1

## 2018-06-02 MED ORDER — ALPRAZOLAM 0.5 MG PO TABS
2.0000 mg | ORAL_TABLET | Freq: Three times a day (TID) | ORAL | Status: DC
  Administered 2018-06-02 – 2018-06-04 (×5): 2 mg via ORAL
  Filled 2018-06-02 (×5): qty 4

## 2018-06-02 MED ORDER — ALBUTEROL SULFATE (2.5 MG/3ML) 0.083% IN NEBU: 2.5000 mg | INHALATION_SOLUTION | RESPIRATORY_TRACT | Status: DC | PRN

## 2018-06-02 MED ORDER — SULFACETAMIDE SODIUM 10 % OP SOLN
1.0000 [drp] | Freq: Four times a day (QID) | OPHTHALMIC | Status: DC
  Administered 2018-06-03: 1 [drp] via OPHTHALMIC
  Administered 2018-06-03 (×3): 2 [drp] via OPHTHALMIC
  Administered 2018-06-04: 1 [drp] via OPHTHALMIC
  Administered 2018-06-04: 2 [drp] via OPHTHALMIC
  Filled 2018-06-02: qty 15

## 2018-06-02 MED ORDER — ONDANSETRON HCL 4 MG/2ML IJ SOLN: 4.0000 mg | Freq: Four times a day (QID) | INTRAMUSCULAR | Status: DC | PRN

## 2018-06-02 MED ORDER — CHLORHEXIDINE GLUCONATE CLOTH 2 % EX PADS: 6.0000 | MEDICATED_PAD | Freq: Every day | CUTANEOUS | Status: DC

## 2018-06-02 MED ORDER — HEPARIN SODIUM (PORCINE) 1000 UNIT/ML IJ SOLN
INTRAMUSCULAR | Status: AC
  Filled 2018-06-02: qty 1

## 2018-06-02 MED ORDER — CALCIUM ACETATE (PHOS BINDER) 667 MG PO CAPS
2001.0000 mg | ORAL_CAPSULE | Freq: Once | ORAL | Status: AC
  Administered 2018-06-02: 2001 mg via ORAL
  Filled 2018-06-02: qty 3

## 2018-06-02 MED ORDER — NITROGLYCERIN 0.4 MG SL SUBL
0.4000 mg | SUBLINGUAL_TABLET | SUBLINGUAL | Status: AC | PRN
  Administered 2018-06-02 (×3): 0.4 mg via SUBLINGUAL
  Filled 2018-06-02: qty 1

## 2018-06-02 MED ORDER — ACETAMINOPHEN 325 MG PO TABS
650.0000 mg | ORAL_TABLET | Freq: Four times a day (QID) | ORAL | Status: DC | PRN
  Administered 2018-06-02: 650 mg via ORAL
  Filled 2018-06-02: qty 2

## 2018-06-02 MED ORDER — ALBUTEROL SULFATE HFA 108 (90 BASE) MCG/ACT IN AERS: 2.0000 | INHALATION_SPRAY | RESPIRATORY_TRACT | Status: DC | PRN

## 2018-06-02 MED ORDER — PANTOPRAZOLE SODIUM 40 MG PO TBEC
40.0000 mg | DELAYED_RELEASE_TABLET | Freq: Every day | ORAL | Status: DC
  Administered 2018-06-03: 40 mg via ORAL
  Filled 2018-06-02: qty 1

## 2018-06-02 MED ORDER — CHLORHEXIDINE GLUCONATE CLOTH 2 % EX PADS
6.0000 | MEDICATED_PAD | Freq: Every day | CUTANEOUS | Status: DC
  Administered 2018-06-02: 6 via TOPICAL

## 2018-06-02 MED ORDER — NIFEDIPINE ER OSMOTIC RELEASE 30 MG PO TB24
60.0000 mg | ORAL_TABLET | Freq: Every day | ORAL | Status: DC
  Administered 2018-06-02 – 2018-06-03 (×2): 60 mg via ORAL
  Filled 2018-06-02: qty 2
  Filled 2018-06-02: qty 1

## 2018-06-02 MED ORDER — SODIUM CHLORIDE 0.9% FLUSH
3.0000 mL | Freq: Two times a day (BID) | INTRAVENOUS | Status: DC
  Administered 2018-06-02 – 2018-06-03 (×3): 3 mL via INTRAVENOUS

## 2018-06-02 MED ORDER — IPRATROPIUM BROMIDE 0.02 % IN SOLN
0.5000 mg | Freq: Once | RESPIRATORY_TRACT | Status: DC
  Filled 2018-06-02: qty 2.5

## 2018-06-02 NOTE — Procedures (Signed)
   I was present at this dialysis session, have reviewed the session itself and made  appropriate changes Kelly Splinter MD [place] pager [phone]   06/02/2018, 3:55 PM

## 2018-06-02 NOTE — Consult Note (Addendum)
[facility] Renal Consultation Note    Indication for Consultation:  Management of ESRD/hemodialysis; anemia, hypertension/volume and secondary hyperparathyroidism  HPI: Desiree [name] is a 43 y.o. female with ESRD on TTS, HTN, asthma  Presented to ED with SOB and CP that started this am. She drove herself to her outpatient dialysis today but felt too bad to stay so EMS was called. Given nitro in ED for CP.   CXR showing pulmonary edema. Labs significant for hyperkalemia with  K 6.4  Hypertensive with mild resp distress in ED. Sitting upright O2 sats 100% on 3L.   Last HD was 3/16. Ran for 1hr 21 mins and left 1.5kg over EDW. Using L AVF. Underwent revision of L AVF on 3/11. Had some concerns about BFR being too high at dialysis.  She has been feeling poorly for months. Endorses poor appetite, weakness, and SOB ongoing x 2 months. Will have urgent HD this afternoon.   Past Medical History:  Diagnosis Date  . Anemia   . Anxiety   . Arthritis    bilateral knees  . Childhood asthma   . Complication of anesthesia    "sometimes it does not work; didn't during [place]" (01/21/2016)  . ESRD (end stage renal disease) on dialysis ([place])    "TTS; [facility]; [facility]" (08/03/2016)  . GERD (gastroesophageal reflux disease)    nexium prn  . Gout   . History of blood transfusion    "related to kidneys; I've had 4" (01/21/2016)  . Hypertension   . Migraine    "qod now" (01/21/2016)  . Preterm labor ~ 2014  . Renal insufficiency   . Retina hole, left   . Seizures ([place])    "last one was in 2000; related to preeclampsia" (01/21/2016)   Past Surgical History:  Procedure Laterality Date  . AV FISTULA PLACEMENT Left 2010  . CERVICAL BIOPSY  W/ LOOP ELECTRODE EXCISION  2001  . DILATION AND EVACUATION  08/02/2011   Procedure: DILATATION AND EVACUATION;  Surgeon: Logan Bores, MD;  Location: [facility];  Service: Gynecology;;  . Brigitte Pulse AND EVACUATION N/A 08/31/2013   Procedure: DILATATION AND EVACUATION;  Surgeon: Woodroe Mode, MD;  Location: [facility];  Service: Gynecology;  Laterality: N/A;  . HYDRADENITIS EXCISION Right   . RENAL BIOPSY    . REVISION OF ARTERIOVENOUS GORETEX GRAFT Left 02/11/2013   Procedure: REVISION OF ARTERIOVENOUS GORTEX FISTULA;  Surgeon: Rosetta Posner, MD;  Location: [facility];  Service: Vascular;  Laterality: Left;  . THROMBECTOMY W/ EMBOLECTOMY Left 05/25/2018   Procedure: REPAIR OF BLEEDING ARTERIOVENOUS FISTULA;  Surgeon: Serafina Mitchell, MD;  Location: [facility];  Service: Vascular;  Laterality: Left;   Family History  Problem Relation Age of Onset  . Diabetes Mother   . Hyperlipidemia Mother   . Hypertension Mother   . Heart disease Mother   . Hypertension Other   . Diabetes type II Other    Social History:  reports that she is a non-smoker but has been exposed to tobacco smoke. She has never used smokeless tobacco. She reports current drug use. Drug: Marijuana. She reports that she does not drink alcohol. Allergies  Allergen Reactions  . Morphine Shortness Of Breath and Anaphylaxis  . Prednisone Other (See Comments)    Other reaction(s): Other (See Comments) Muscle spasms Patient says prednisone causes her to cramp all over, muscle spasms uncontrolled  . Geralyn Flash [Fish Allergy] Itching, Swelling, Rash and Other (See Comments)    Face  droops also  . Amlodipine     Angioedema (09/05/17 ED visit)  . Iodinated Diagnostic Agents Itching  . Tape Itching   Prior to Admission medications   Medication Sig Start Date End Date Taking? Authorizing Provider  albuterol (PROVENTIL HFA;VENTOLIN HFA) 108 (90 Base) MCG/ACT inhaler Inhale 2 puffs into the lungs every 4 (four) hours as needed for wheezing or shortness of breath. 05/02/16  Yes Forde Dandy, MD  alprazolam Duanne Moron) 2 MG tablet Take 2 mg by mouth 3 (three) times daily. 05/17/18  Yes [provider]  calcium acetate (PHOSLO) 667 MG capsule Take 667-1,334 mg by mouth See admin  instructions. Take 3 capsules with meals and 1 to 2 with snacks 11/30/17  Yes [provider]  cinacalcet (SENSIPAR) 30 MG tablet Take 60 mg by mouth every evening.    Yes [provider]  diphenhydrAMINE (BENADRYL) 25 MG tablet Take 1 tablet (25 mg total) by mouth every 6 (six) hours. 09/05/17  Yes Domenic Moras, PA-C  NIFEdipine (ADALAT CC) 30 MG 24 hr tablet Take 60 mg by mouth at bedtime. 04/23/18  Yes [provider]  omeprazole (PRILOSEC) 20 MG capsule Take 20 mg by mouth daily. 03/31/18  Yes [provider]  ondansetron (ZOFRAN-ODT) 8 MG disintegrating tablet Take 8 mg by mouth every 8 (eight) hours as needed for nausea or vomiting.   Yes [provider]  Oxycodone HCl 10 MG TABS Take 1 tablet (10 mg total) by mouth every 8 (eight) hours as needed. 05/25/18  Yes Dagoberto Ligas, PA-C  sulfacetamide (BLEPH-10) 10 % ophthalmic solution Place 1-2 drops into both eyes every 6 (six) hours. 05/20/18  Yes Khatri, Hina, PA-C   Current Facility-Administered Medications  Medication Dose Route Frequency Provider Last Rate Last Dose  . albuterol (PROVENTIL) (2.5 MG/3ML) 0.083% nebulizer solution 5 mg  5 mg Nebulization Once Blanchie Dessert, MD      . Derrill Memo ON 06/03/2018] Chlorhexidine Gluconate Cloth 2 % PADS 6 each  6 each Topical Q0600 Lynnda Child, PA-C      . ipratropium (ATROVENT) nebulizer solution 0.5 mg  0.5 mg Nebulization Once Blanchie Dessert, MD       Current Outpatient Medications  Medication Sig Dispense Refill  . albuterol (PROVENTIL HFA;VENTOLIN HFA) 108 (90 Base) MCG/ACT inhaler Inhale 2 puffs into the lungs every 4 (four) hours as needed for wheezing or shortness of breath. 1 Inhaler 2  . alprazolam (XANAX) 2 MG tablet Take 2 mg by mouth 3 (three) times daily.    . calcium acetate (PHOSLO) 667 MG capsule Take 667-1,334 mg by mouth See admin instructions. Take 3 capsules with meals and 1 to 2 with snacks  4  . cinacalcet (SENSIPAR) 30 MG  tablet Take 60 mg by mouth every evening.     . diphenhydrAMINE (BENADRYL) 25 MG tablet Take 1 tablet (25 mg total) by mouth every 6 (six) hours. 20 tablet 0  . NIFEdipine (ADALAT CC) 30 MG 24 hr tablet Take 60 mg by mouth at bedtime.    Marland Kitchen omeprazole (PRILOSEC) 20 MG capsule Take 20 mg by mouth daily.    . ondansetron (ZOFRAN-ODT) 8 MG disintegrating tablet Take 8 mg by mouth every 8 (eight) hours as needed for nausea or vomiting.    . Oxycodone HCl 10 MG TABS Take 1 tablet (10 mg total) by mouth every 8 (eight) hours as needed. 8 tablet 0  . sulfacetamide (BLEPH-10) 10 % ophthalmic solution Place 1-2 drops into both eyes  every 6 (six) hours. 10 mL 0     ROS: As per HPI otherwise negative.  Physical Exam: Vitals:   06/02/18 1407 06/02/18 1412 06/02/18 1417 06/02/18 1421  BP: (!) 181/95 (!) 164/97 (!) 150/93 (!) 160/97  Pulse: 80 81 78 77  Resp: 16 20 (!) 21 (!) 21  Temp:      TempSrc:      SpO2: 100% 100%  100%     General: WDWN female crying on nasal oxygen  Head: NCAT some periorbital edema  Neck: Supple. +JVD  Lungs: Increased WOB; Diminished at bases  Heart: RRR with S1 S2 Abdomen: soft NT + BS Lower extremities: No LE edema  Neuro: A & O  X 3. Moves all extremities spontaneously. Psych:  Responds to questions appropriately with a normal affect. Dialysis Access: LUE AVF +bruit   Labs: Basic Metabolic Panel: Recent Labs  Lab 06/02/18 1405  NA 135  K 6.4*  CL 96*  CO2 21*  GLUCOSE 85  BUN 71*  CREATININE 21.32*  CALCIUM 9.8   Liver Function Tests: Recent Labs  Lab 06/02/18 1405  AST 12*  ALT 5  ALKPHOS 50  BILITOT 0.6  PROT 8.6*  ALBUMIN 3.7   No results for input(s): LIPASE, AMYLASE in the last 168 hours. No results for input(s): AMMONIA in the last 168 hours. CBC: Recent Labs  Lab 06/02/18 1405  WBC 7.7  NEUTROABS 5.7  HGB 8.2*  HCT 27.6*  MCV 89.0  PLT 311   Cardiac Enzymes: No results for input(s): CKTOTAL, CKMB, CKMBINDEX, TROPONINI in  the last 168 hours. CBG: No results for input(s): GLUCAP in the last 168 hours. Iron Studies: No results for input(s): IRON, TIBC, TRANSFERRIN, FERRITIN in the last 72 hours. Studies/Results: Dg Chest Port 1 View  Result Date: 06/02/2018 CLINICAL DATA:  Chest pain and shortness of Breath EXAM: PORTABLE CHEST 1 VIEW COMPARISON:  12/07/2017 FINDINGS: Cardiac shadow is enlarged but stable. The lungs again demonstrate hazy opacities bilaterally consistent with mild pulmonary edema. No sizable effusion is seen. No bony abnormality is noted. IMPRESSION: Changes consistent with mild CHF. Electronically Signed   By: Inez Catalina M.D.   On: 06/02/2018 13:27    Dialysis Orders:  AF TTS 3h42mins 180Nre 400/800 EDW 84kg 2K/2.25 CA L AVF Hep 6600 Hectorol 3 mcg IV TIW Venofer 100mg  IV x 10 (until 3/21) Mircera 225 mcg IV q 2 weeks (last 3/10)  Assessment/Plan: 1. Pulm edema/Volume overload - 2/2 missed HD and loss of body weight. Will plan for HD today and again tomorrow for volume removal. UF goal 5L today. ESRD pt with loss of appetite off and on last 3- 6 mos, here w/ SOB/ orthopnea. OP HD has been trying to lower the dry wt.  Plan admit for serial HD and lower dry wt.  2. Hyperkalemia. K 6.4 - Will correct with HD  3. ESRD -  HD TTS. For HD today as above  4. Hypertension  - BP elevated with volume excess. No home BP meds. Monitor after HD 5. Anemia  - Hgb 8.2. on ESA/Fe bolus  6. Metabolic bone disease -  Continue home meds.   Lynnda Child PA-C [facility] Pager [phone] 06/02/2018, 3:15 PM   Pt seen, examined and agree w assess/plan as above with additions as indicated.  [facility] 06/02/2018, 4:00 PM

## 2018-06-02 NOTE — H&P (Signed)
History and Physical  Desiree [name] WNI:[id number] DOB: 1975-09-26 DOA: 06/02/2018  PCP: Harvie Junior, MD   Chief Complaint: short of breath  HPI:  63yow PMH ESRD on HD TTS presented with SOB  Ongoing issues with same and dry weight has been adjusted in past. Reports HD Monday was only 2 hours instead of usual 3.75 hours. Awoke today with severe SOB. Also had chest and back discomfort, takes oxycodone at home.  Feels much better on HD, now can speak in full sentences. Poor oral intake as an outpatient. Thinks she has lost weight and thus EDW is overestimated now.  COVID SCREEN Fever: no  Cough: no   SOB: yes URI symptoms: no GI symptoms: no Travel: no Sick contacts: no   ED Course: treated with oxygen   Review of Systems:  Negative for fever, visual changes, sore throat, rash, new muscle aches, dysuria, bleeding, n/v/abdominal pain.  Past Medical History:  Diagnosis Date  . Anemia   . Anxiety   . Arthritis    bilateral knees  . Childhood asthma   . Complication of anesthesia    "sometimes it does not work; didn't during [place]" (01/21/2016)  . ESRD (end stage renal disease) on dialysis ([place])    "TTS; [facility]; [facility]" (08/03/2016)  . GERD (gastroesophageal reflux disease)    nexium prn  . Gout   . History of blood transfusion    "related to kidneys; I've had 4" (01/21/2016)  . Hypertension   . Migraine    "qod now" (01/21/2016)  . Preterm labor ~ 2014  . Renal insufficiency   . Retina hole, left   . Seizures ([place])    "last one was in 2000; related to preeclampsia" (01/21/2016)    Past Surgical History:  Procedure Laterality Date  . AV FISTULA PLACEMENT Left 2010  . CERVICAL BIOPSY  W/ LOOP ELECTRODE EXCISION  2001  . DILATION AND EVACUATION  08/02/2011   Procedure: DILATATION AND EVACUATION;  Surgeon: Logan Bores, MD;  Location: [facility];  Service: Gynecology;;  . Brigitte Pulse AND EVACUATION N/A 08/31/2013   Procedure: DILATATION AND  EVACUATION;  Surgeon: Woodroe Mode, MD;  Location: [facility];  Service: Gynecology;  Laterality: N/A;  . HYDRADENITIS EXCISION Right   . RENAL BIOPSY    . REVISION OF ARTERIOVENOUS GORETEX GRAFT Left 02/11/2013   Procedure: REVISION OF ARTERIOVENOUS GORTEX FISTULA;  Surgeon: Rosetta Posner, MD;  Location: [facility];  Service: Vascular;  Laterality: Left;  . THROMBECTOMY W/ EMBOLECTOMY Left 05/25/2018   Procedure: REPAIR OF BLEEDING ARTERIOVENOUS FISTULA;  Surgeon: Serafina Mitchell, MD;  Location: [facility];  Service: Vascular;  Laterality: Left;     reports that she is a non-smoker but has been exposed to tobacco smoke. She has never used smokeless tobacco. She reports current drug use. Drug: Marijuana. She reports that she does not drink alcohol. Mobility: ambulatory  Allergies  Allergen Reactions  . Morphine Shortness Of Breath and Anaphylaxis  . Prednisone Other (See Comments)    Other reaction(s): Other (See Comments) Muscle spasms Patient says prednisone causes her to cramp all over, muscle spasms uncontrolled  . Geralyn Flash [Fish Allergy] Itching, Swelling, Rash and Other (See Comments)    Face droops also  . Amlodipine     Angioedema (09/05/17 ED visit)  . Iodinated Diagnostic Agents Itching  . Tape Itching    Family History  Problem Relation Age of Onset  . Diabetes Mother   . Hyperlipidemia Mother   .  Hypertension Mother   . Heart disease Mother   . Hypertension Other   . Diabetes type II Other      Prior to Admission medications   Medication Sig Start Date End Date Taking? Authorizing Provider  albuterol (PROVENTIL HFA;VENTOLIN HFA) 108 (90 Base) MCG/ACT inhaler Inhale 2 puffs into the lungs every 4 (four) hours as needed for wheezing or shortness of breath. 05/02/16  Yes Forde Dandy, MD  alprazolam Duanne Moron) 2 MG tablet Take 2 mg by mouth 3 (three) times daily. 05/17/18  Yes [provider]  calcium acetate (PHOSLO) 667 MG capsule Take 667-1,334 mg by mouth See admin  instructions. Take 3 capsules with meals and 1 to 2 with snacks 11/30/17  Yes [provider]  cinacalcet (SENSIPAR) 30 MG tablet Take 60 mg by mouth every evening.    Yes [provider]  diphenhydrAMINE (BENADRYL) 25 MG tablet Take 1 tablet (25 mg total) by mouth every 6 (six) hours. 09/05/17  Yes Domenic Moras, PA-C  NIFEdipine (ADALAT CC) 30 MG 24 hr tablet Take 60 mg by mouth at bedtime. 04/23/18  Yes [provider]  omeprazole (PRILOSEC) 20 MG capsule Take 20 mg by mouth daily. 03/31/18  Yes [provider]  ondansetron (ZOFRAN-ODT) 8 MG disintegrating tablet Take 8 mg by mouth every 8 (eight) hours as needed for nausea or vomiting.   Yes [provider]  Oxycodone HCl 10 MG TABS Take 1 tablet (10 mg total) by mouth every 8 (eight) hours as needed. 05/25/18  Yes Dagoberto Ligas, PA-C  sulfacetamide (BLEPH-10) 10 % ophthalmic solution Place 1-2 drops into both eyes every 6 (six) hours. 05/20/18  Yes Delia Heady, PA-C    Physical Exam: Vitals:   06/02/18 1417 06/02/18 1421  BP: (!) 150/93 (!) 160/97  Pulse: 78 77  Resp: (!) 21 (!) 21  Temp:    SpO2:  100%    Constitutional:   . Appears calm and comfortable on HD Eyes:  . pupils and irises appear normal . Normal lids  ENMT:  . grossly normal hearing  . Lips appear normal Neck:  . neck appears normal, no masses . no thyromegaly Respiratory:  . CTA bilaterally, no w/r/r.  . Respiratory effort normal.  Cardiovascular:  . RRR, no m/r/g . No LE extremity edema   Abdomen:  . softntnd Musculoskeletal:  . Digits/nails BUE: no clubbing, cyanosis, petechiae, infection . RUE, LUE  o strength and tone normal, no atrophy, no abnormal movements o No tenderness, masses Skin:  . No rashes, lesions, ulcers . palpation of skin: no induration or nodules Psychiatric:  . Mental status o Mood, affect appropriate . judgment and insight appear intact   I have personally reviewed following labs and  imaging studies  Labs:   K+ 6.4, BMP notaed, LFTs noted  Hgb stable 8.2    Troponin negative  Imaging studies:   CXR independent review: CHF   Medical tests:   EKG independently reviewed: SR no acute changes     Principal Problem:   Acute respiratory failure (HCC) Active Problems:   Anemia of chronic kidney failure   ESRD (end stage renal disease) on dialysis (HCC)   Assessment/Plan Acute respiratory failure secondary to volume overload/pulmonary edema --secondary to abbreviated HD Monday off schedule and EDW issues --no s/s of infection --no s/s of ACS --nephrology rec obs, thinks patient will need more than one treatment --wean oxygen as tolerated  ESRD with hyperkalemia --HD per nephrology  Anemia of ESRD --  Hgb stable  Severity of Illness: The appropriate patient status for this patient is OBSERVATION. Observation status is judged to be reasonable and necessary in order to provide the required intensity of service to ensure the patient's safety. The patient's presenting symptoms, physical exam findings, and initial radiographic and laboratory data in the context of their medical condition is felt to place them at decreased risk for further clinical deterioration. Furthermore, it is anticipated that the patient will be medically stable for discharge from the hospital within 2 midnights of admission. The following factors support the patient status of observation.   " The patient's presenting symptoms include SOB. " The physical exam findings include benign. " The initial radiographic and laboratory data are pulmonary edema.      DVT prophylaxis: SCDs Code Status: Full Family Communication: none Consults called: nephrology    Time spent: 60 minutes  Murray Hodgkins, MD  [facility] Direct contact: see [email or web address]  7PM-7AM contact night coverage as below   1. Check the care team in [facility] and look for a) attending/consulting [facility] provider listed and  b) the [facility] team listed 2. Log into [email or web address] and use [place]'s universal password to access. If you do not have the password, please contact the hospital operator. 3. Locate the [facility] provider you are looking for under [facility] and page to a number that you can be directly reached. 4. If you still have difficulty reaching the provider, please page the [facility] (Director on Call) for the Hospitalists listed on amion for assistance.   06/02/2018, 3:33 PM

## 2018-06-02 NOTE — ED Triage Notes (Signed)
Pt presents with onset of mid-sternal chest pain that woke her from sleep this morning.  Pt reports shortness of breath x 2 months; pt is on HD, has not had last 3 treatments.

## 2018-06-02 NOTE — ED Provider Notes (Signed)
[place] [facility] EMERGENCY DEPARTMENT Provider Note   CSN: [id number] Arrival date & time: 06/02/18  1247    History   Chief Complaint Chief Complaint  Patient presents with  . Shortness of Breath    HPI Desiree [name] is a 43 y.o. female.     Patient is a 43 year old female with a history of end-stage renal disease on dialysis Tuesday Thursday Saturday, hypertension, anemia and asthma presenting today with worsening shortness of breath.  Patient states over the last few months she has had ongoing issues with shortness of breath and had told her doctors.  She states they continue to adjust her dry weight but it did not significantly help with the shortness of breath.  However patient had a procedure done on her graft on 11 March.  She went to dialysis on Saturday and had a full course.  She then had plan to do dialysis on Monday because of an appointment on Tuesday.  Since the surgery on her graft they have not been able to run as fast and on Monday she states the nurse turned it up too high so she only got 2 hours of treatment.  She was supposed to dialyze today but states when she woke up that this morning she was having severe shortness of breath.  It did not get better with sitting up and was worse when trying to walk.  She was able to drive herself to dialysis but then stated she almost collapsed when she got there.  She states in the past she has been short of breath prior to dialysis but always resolved with nasal cannula oxygen.  However today it has not gotten better.  She is also having a discomfort in her chest, back that she is never experienced before.  She describes it as a pressure and heaviness.  She denies any leg swelling, unilateral leg pain or prior history of blood clot.  The history is provided by the patient.  Shortness of Breath  Severity:  Severe Onset quality:  Gradual Duration:  2 months Timing:  Constant Progression:  Worsening (Significantly  worsened this morning) Chronicity:  New Relieved by:  Nothing Worsened by:  Activity (Lying down) Associated symptoms: abdominal pain, chest pain and wheezing   Associated symptoms: no cough, no sputum production and no vomiting   Risk factors comment:  End-stage renal disease on dialysis, asthma, anemia requiring iron transfusions, hypertension   Past Medical History:  Diagnosis Date  . Anemia   . Anxiety   . Arthritis    bilateral knees  . Childhood asthma   . Complication of anesthesia    "sometimes it does not work; didn't during [place]" (01/21/2016)  . ESRD (end stage renal disease) on dialysis ([place])    "TTS; [facility]; [facility]" (08/03/2016)  . GERD (gastroesophageal reflux disease)    nexium prn  . Gout   . History of blood transfusion    "related to kidneys; I've had 4" (01/21/2016)  . Hypertension   . Migraine    "qod now" (01/21/2016)  . Preterm labor ~ 2014  . Renal insufficiency   . Retina hole, left   . Seizures ([place])    "last one was in 2000; related to preeclampsia" (01/21/2016)    Patient Active Problem List   Diagnosis Date Noted  . Fever 12/07/2017  . Abdominal pain 08/03/2016  . Chronic pain 01/21/2016  . Muscle cramps 03/29/2015  . Anemia of chronic kidney failure 09/15/2013  . Cocaine  abuse ([place]) 08/26/2013  . Depression 04/21/2012  . ESRD (end stage renal disease) ([place]) 08/03/2011    Past Surgical History:  Procedure Laterality Date  . AV FISTULA PLACEMENT Left 2010  . CERVICAL BIOPSY  W/ LOOP ELECTRODE EXCISION  2001  . DILATION AND EVACUATION  08/02/2011   Procedure: DILATATION AND EVACUATION;  Surgeon: Logan Bores, MD;  Location: [facility];  Service: Gynecology;;  . Brigitte Pulse AND EVACUATION N/A 08/31/2013   Procedure: DILATATION AND EVACUATION;  Surgeon: Woodroe Mode, MD;  Location: [facility];  Service: Gynecology;  Laterality: N/A;  . HYDRADENITIS EXCISION Right   . RENAL BIOPSY    . REVISION OF ARTERIOVENOUS GORETEX GRAFT Left  02/11/2013   Procedure: REVISION OF ARTERIOVENOUS GORTEX FISTULA;  Surgeon: Rosetta Posner, MD;  Location: [facility];  Service: Vascular;  Laterality: Left;  . THROMBECTOMY W/ EMBOLECTOMY Left 05/25/2018   Procedure: REPAIR OF BLEEDING ARTERIOVENOUS FISTULA;  Surgeon: Serafina Mitchell, MD;  Location: [facility];  Service: Vascular;  Laterality: Left;     OB History    Gravida  6   Para  1   Term  1   Preterm  0   AB  4   Living  1     SAB  2   TAB  2   Ectopic  0   Multiple  0   Live Births  1            Home Medications    Prior to Admission medications   Medication Sig Start Date End Date Taking? Authorizing Provider  albuterol (PROVENTIL HFA;VENTOLIN HFA) 108 (90 Base) MCG/ACT inhaler Inhale 2 puffs into the lungs every 4 (four) hours as needed for wheezing or shortness of breath. 05/02/16   Forde Dandy, MD  alprazolam Duanne Moron) 2 MG tablet Take 2 mg by mouth 3 (three) times daily. 05/17/18   [provider]  calcium acetate (PHOSLO) 667 MG capsule Take 667-1,334 mg by mouth See admin instructions. Take 3 capsules with meals and 1 to 2 with snacks 11/30/17   [provider]  cinacalcet (SENSIPAR) 30 MG tablet Take 60 mg by mouth every evening.     [provider]  diphenhydrAMINE (BENADRYL) 25 MG tablet Take 1 tablet (25 mg total) by mouth every 6 (six) hours. 09/05/17   Domenic Moras, PA-C  NIFEdipine (ADALAT CC) 30 MG 24 hr tablet Take 60 mg by mouth at bedtime. 04/23/18   [provider]  omeprazole (PRILOSEC) 20 MG capsule Take 20 mg by mouth daily. 03/31/18   [provider]  ondansetron (ZOFRAN-ODT) 8 MG disintegrating tablet Take 8 mg by mouth every 8 (eight) hours as needed for nausea or vomiting.    [provider]  Oxycodone HCl 10 MG TABS Take 1 tablet (10 mg total) by mouth every 8 (eight) hours as needed. 05/25/18   Dagoberto Ligas, PA-C  sevelamer carbonate (RENVELA) 800 MG tablet Take 1,600 mg by mouth 3 (three) times  daily with meals.     [provider]  sulfacetamide (BLEPH-10) 10 % ophthalmic solution Place 1-2 drops into both eyes every 6 (six) hours. 05/20/18   Delia Heady, PA-C    Family History Family History  Problem Relation Age of Onset  . Diabetes Mother   . Hyperlipidemia Mother   . Hypertension Mother   . Heart disease Mother   . Hypertension Other   . Diabetes type II Other     Social History Social History  Tobacco Use  . Smoking status: Passive Smoke Exposure - Never Smoker  . Smokeless tobacco: Never Used  Substance Use Topics  . Alcohol use: No  . Drug use: Yes    Types: Marijuana    Comment: 05/25/2018 "maybe 2 months"     Allergies   Morphine; Prednisone; [place] [fish allergy]; Amlodipine; Iodinated diagnostic agents; and Tape   Review of Systems Review of Systems  Respiratory: Positive for shortness of breath and wheezing. Negative for cough and sputum production.   Cardiovascular: Positive for chest pain.  Gastrointestinal: Positive for abdominal pain. Negative for vomiting.  All other systems reviewed and are negative.    Physical Exam Updated Vital Signs BP (!) 176/101 (BP Location: Right Arm)   Pulse 94   Temp 99 F (37.2 C) (Oral)   Resp (!) 24   LMP 05/25/2018   SpO2 100%   Physical Exam Vitals signs and nursing note reviewed.  Constitutional:      General: She is in acute distress.     Appearance: She is well-developed.  HENT:     Head: Normocephalic and atraumatic.  Eyes:     Pupils: Pupils are equal, round, and reactive to light.  Cardiovascular:     Rate and Rhythm: Normal rate and regular rhythm.     Heart sounds: Normal heart sounds. No murmur. No friction rub.  Pulmonary:     Effort: Tachypnea and accessory muscle usage present.     Breath sounds: Wheezing and rales present.  Chest:     Chest wall: No tenderness.  Abdominal:     General: Bowel sounds are normal. There is no distension.     Palpations: Abdomen is soft.      Tenderness: There is no abdominal tenderness. There is no guarding or rebound.  Musculoskeletal: Normal range of motion.        General: No tenderness.     Right lower leg: She exhibits no tenderness. No edema.     Left lower leg: She exhibits no tenderness. No edema.     Comments: Palpable thrill in the graft in the left forearm  Skin:    General: Skin is warm and dry.     Capillary Refill: Capillary refill takes 2 to 3 seconds.     Findings: No rash.  Neurological:     General: No focal deficit present.     Mental Status: She is alert and oriented to person, place, and time. Mental status is at baseline.     Cranial Nerves: No cranial nerve deficit.  Psychiatric:        Behavior: Behavior normal.        Thought Content: Thought content normal.      ED Treatments / Results  Labs (all labs ordered are listed, but only abnormal results are displayed) Labs Reviewed  CBC WITH DIFFERENTIAL/PLATELET - Abnormal; Notable for the following components:      Result Value   RBC 3.10 (*)    Hemoglobin 8.2 (*)    HCT 27.6 (*)    MCHC 29.7 (*)    RDW 20.4 (*)    All other components within normal limits  COMPREHENSIVE METABOLIC PANEL - Abnormal; Notable for the following components:   Potassium 6.4 (*)    Chloride 96 (*)    CO2 21 (*)    BUN 71 (*)    Creatinine, Ser 21.32 (*)    Total Protein 8.6 (*)    AST 12 (*)    GFR calc non  Af Amer 2 (*)    GFR calc Af Amer 2 (*)    Anion gap 18 (*)    All other components within normal limits  I-STAT TROPONIN, ED    EKG EKG Interpretation  Date/Time:  Thursday June 02 2018 13:02:42 EDT Ventricular Rate:  84 PR Interval:    QRS Duration: 74 QT Interval:  398 QTC Calculation: 471 R Axis:   35 Text Interpretation:  Sinus rhythm Probable left atrial enlargement new Abnrm T, consider ischemia, anterolateral lds Confirmed by Blanchie Dessert [phone]) on 06/02/2018 1:19:06 PM   Radiology Dg Chest Port 1 View  Result Date:  06/02/2018 CLINICAL DATA:  Chest pain and shortness of Breath EXAM: PORTABLE CHEST 1 VIEW COMPARISON:  12/07/2017 FINDINGS: Cardiac shadow is enlarged but stable. The lungs again demonstrate hazy opacities bilaterally consistent with mild pulmonary edema. No sizable effusion is seen. No bony abnormality is noted. IMPRESSION: Changes consistent with mild CHF. Electronically Signed   By: Inez Catalina M.D.   On: 06/02/2018 13:27    Procedures Procedures (including critical care time)  Medications Ordered in ED Medications  albuterol (PROVENTIL) (2.5 MG/3ML) 0.083% nebulizer solution 5 mg (has no administration in time range)  ipratropium (ATROVENT) nebulizer solution 0.5 mg (has no administration in time range)     Initial Impression / Assessment and Plan / ED Course  I have reviewed the triage vital signs and the nursing notes.  Pertinent labs & imaging results that were available during my care of the patient were reviewed by me and considered in my medical decision making (see chart for details).       Patient presenting here today with worsening shortness of breath.  Patient is an end-stage renal disease patient who is on dialysis.  She has not had her normal dialysis this week because she only had half a course on Monday because of issues with the nurse running it too fast because she recently had surgery on the graft.  She was going to have her normal session today but was too short of breath.  On exam patient has some mild wheezing, she is tachypneic and anxious.  Oxygen saturation is 100% and she has nasal cannula oxygen.  She does not have peripheral edema.  She is hypertensive and tachypneic.  Patient is also complaining of pain in her chest and back.  This is in addition to the shortness of breath.  Concern for ACS related to fluid overload and heart strain.  She also has a history of asthma with some wheezing will give albuterol and Atrovent.  Patient was also given nitroglycerin for  blood pressure.  She may require BiPAP.  Labs and chest x-ray pending.  EKG shows new T wave inversion laterally.  No ST elevation at this time.  Patient denies any cough or congestion concerning for infectious etiology.  1:36 PM On reevaluation patient is still tachypneic but is not having significant wheezing so held on nebs.  Will use the nitroglycerin and if it does not improve will start on BiPAP.    2:15 PM Spoke with Dr. Jonnie Finner and they will come see the pt for dialysis.  Labs still pending.  Hb at baseline.  CXR shows mild CHF.    2:48 PM CMP with K of 6.4 and evidence that pt needs dialysis.  Final Clinical Impressions(s) / ED Diagnoses   Final diagnoses:  Hyperkalemia  Other hypervolemia  ESRD (end stage renal disease) on dialysis [facility])    ED Discharge Orders  None       Blanchie Dessert, MD 06/02/18 [phone]

## 2018-06-03 DIAGNOSIS — I132 Hypertensive heart and chronic kidney disease with heart failure and with stage 5 chronic kidney disease, or end stage renal disease: Secondary | ICD-10-CM | POA: Diagnosis present

## 2018-06-03 DIAGNOSIS — N2581 Secondary hyperparathyroidism of renal origin: Secondary | ICD-10-CM | POA: Diagnosis present

## 2018-06-03 DIAGNOSIS — N185 Chronic kidney disease, stage 5: Secondary | ICD-10-CM | POA: Diagnosis not present

## 2018-06-03 DIAGNOSIS — Z885 Allergy status to narcotic agent status: Secondary | ICD-10-CM | POA: Diagnosis not present

## 2018-06-03 DIAGNOSIS — K219 Gastro-esophageal reflux disease without esophagitis: Secondary | ICD-10-CM | POA: Diagnosis present

## 2018-06-03 DIAGNOSIS — Z8249 Family history of ischemic heart disease and other diseases of the circulatory system: Secondary | ICD-10-CM | POA: Diagnosis not present

## 2018-06-03 DIAGNOSIS — Z91041 Radiographic dye allergy status: Secondary | ICD-10-CM | POA: Diagnosis not present

## 2018-06-03 DIAGNOSIS — M109 Gout, unspecified: Secondary | ICD-10-CM | POA: Diagnosis present

## 2018-06-03 DIAGNOSIS — J45909 Unspecified asthma, uncomplicated: Secondary | ICD-10-CM | POA: Diagnosis present

## 2018-06-03 DIAGNOSIS — M170 Bilateral primary osteoarthritis of knee: Secondary | ICD-10-CM | POA: Diagnosis present

## 2018-06-03 DIAGNOSIS — Z91013 Allergy to seafood: Secondary | ICD-10-CM | POA: Diagnosis not present

## 2018-06-03 DIAGNOSIS — R0602 Shortness of breath: Secondary | ICD-10-CM | POA: Diagnosis present

## 2018-06-03 DIAGNOSIS — N186 End stage renal disease: Secondary | ICD-10-CM | POA: Diagnosis present

## 2018-06-03 DIAGNOSIS — Z79899 Other long term (current) drug therapy: Secondary | ICD-10-CM | POA: Diagnosis not present

## 2018-06-03 DIAGNOSIS — D631 Anemia in chronic kidney disease: Secondary | ICD-10-CM | POA: Diagnosis present

## 2018-06-03 DIAGNOSIS — E875 Hyperkalemia: Secondary | ICD-10-CM | POA: Diagnosis present

## 2018-06-03 DIAGNOSIS — E8779 Other fluid overload: Secondary | ICD-10-CM

## 2018-06-03 DIAGNOSIS — Z992 Dependence on renal dialysis: Secondary | ICD-10-CM | POA: Diagnosis not present

## 2018-06-03 DIAGNOSIS — G43909 Migraine, unspecified, not intractable, without status migrainosus: Secondary | ICD-10-CM | POA: Diagnosis present

## 2018-06-03 DIAGNOSIS — Z888 Allergy status to other drugs, medicaments and biological substances status: Secondary | ICD-10-CM | POA: Diagnosis not present

## 2018-06-03 DIAGNOSIS — Z9109 Other allergy status, other than to drugs and biological substances: Secondary | ICD-10-CM | POA: Diagnosis not present

## 2018-06-03 DIAGNOSIS — E8889 Other specified metabolic disorders: Secondary | ICD-10-CM | POA: Diagnosis present

## 2018-06-03 DIAGNOSIS — J9601 Acute respiratory failure with hypoxia: Secondary | ICD-10-CM | POA: Diagnosis present

## 2018-06-03 DIAGNOSIS — F419 Anxiety disorder, unspecified: Secondary | ICD-10-CM | POA: Diagnosis present

## 2018-06-03 DIAGNOSIS — I509 Heart failure, unspecified: Secondary | ICD-10-CM | POA: Diagnosis present

## 2018-06-03 DIAGNOSIS — J9600 Acute respiratory failure, unspecified whether with hypoxia or hypercapnia: Secondary | ICD-10-CM | POA: Diagnosis not present

## 2018-06-03 LAB — BASIC METABOLIC PANEL
ANION GAP: 12 (ref 5–15)
BUN: 43 mg/dL — ABNORMAL HIGH (ref 6–20)
CO2: 28 mmol/L (ref 22–32)
Calcium: 8.8 mg/dL — ABNORMAL LOW (ref 8.9–10.3)
Chloride: 98 mmol/L (ref 98–111)
Creatinine, Ser: 14.04 mg/dL — ABNORMAL HIGH (ref 0.44–1.00)
GFR calc Af Amer: 3 mL/min — ABNORMAL LOW (ref >60)
GFR, EST NON AFRICAN AMERICAN: 3 mL/min — AB (ref >60)
Glucose, Bld: 109 mg/dL — ABNORMAL HIGH (ref 70–99)
Potassium: 3.7 mmol/L (ref 3.5–5.1)
Sodium: 138 mmol/L (ref 135–145)

## 2018-06-03 LAB — CBC
HCT: 24.5 % — ABNORMAL LOW (ref 36.0–46.0)
HEMOGLOBIN: 7.4 g/dL — AB (ref 12.0–15.0)
MCH: 27.2 pg (ref 26.0–34.0)
MCHC: 30.2 g/dL (ref 30.0–36.0)
MCV: 90.1 fL (ref 80.0–100.0)
Platelets: 282 10*3/uL (ref 150–400)
RBC: 2.72 MIL/uL — AB (ref 3.87–5.11)
RDW: 20.2 % — ABNORMAL HIGH (ref 11.5–15.5)
WBC: 4.6 10*3/uL (ref 4.0–10.5)
nRBC: 0 % (ref 0.0–0.2)

## 2018-06-03 MED ORDER — SODIUM CHLORIDE 0.9 % IV SOLN: 100.0000 mL | INTRAVENOUS | Status: DC | PRN

## 2018-06-03 MED ORDER — PENTAFLUOROPROP-TETRAFLUOROETH EX AERO: 1.0000 "application " | INHALATION_SPRAY | CUTANEOUS | Status: DC | PRN

## 2018-06-03 MED ORDER — LIDOCAINE HCL (PF) 1 % IJ SOLN: 5.0000 mL | INTRAMUSCULAR | Status: DC | PRN

## 2018-06-03 MED ORDER — HEPARIN SODIUM (PORCINE) 1000 UNIT/ML DIALYSIS: 1000.0000 [IU] | INTRAMUSCULAR | Status: DC | PRN

## 2018-06-03 MED ORDER — HEPARIN SODIUM (PORCINE) 1000 UNIT/ML DIALYSIS
6000.0000 [IU] | Freq: Once | INTRAMUSCULAR | Status: AC
  Administered 2018-06-03: 6000 [IU] via INTRAVENOUS_CENTRAL

## 2018-06-03 MED ORDER — CALCIUM ACETATE (PHOS BINDER) 667 MG PO CAPS
2001.0000 mg | ORAL_CAPSULE | Freq: Three times a day (TID) | ORAL | Status: DC
  Administered 2018-06-03: 2001 mg via ORAL
  Filled 2018-06-03: qty 3

## 2018-06-03 MED ORDER — SEVELAMER CARBONATE 800 MG PO TABS
1600.0000 mg | ORAL_TABLET | Freq: Three times a day (TID) | ORAL | Status: DC
  Filled 2018-06-03 (×3): qty 2

## 2018-06-03 MED ORDER — SODIUM CHLORIDE 0.9 % IV SOLN
125.0000 mg | INTRAVENOUS | Status: DC
  Administered 2018-06-04: 125 mg via INTRAVENOUS
  Filled 2018-06-03 (×2): qty 10

## 2018-06-03 MED ORDER — HEPARIN SODIUM (PORCINE) 1000 UNIT/ML IJ SOLN
INTRAMUSCULAR | Status: AC
  Administered 2018-06-03: 6000 [IU] via INTRAVENOUS_CENTRAL
  Filled 2018-06-03: qty 6

## 2018-06-03 MED ORDER — CHLORHEXIDINE GLUCONATE CLOTH 2 % EX PADS
6.0000 | MEDICATED_PAD | Freq: Every day | CUTANEOUS | Status: DC
  Administered 2018-06-04: 6 via TOPICAL

## 2018-06-03 MED ORDER — LIDOCAINE-PRILOCAINE 2.5-2.5 % EX CREA: 1.0000 "application " | TOPICAL_CREAM | CUTANEOUS | Status: DC | PRN

## 2018-06-03 NOTE — Progress Notes (Addendum)
[facility] Progress Note  Subjective: feeling better, SOB resolved.  Cramping on HD now.   Vitals:   06/03/18 0930 06/03/18 0951 06/03/18 1000 06/03/18 1022  BP: 127/82 (!) 133/108 127/85 (!) 147/84  Pulse: 85 97 96 94  Resp:    (!) 29  Temp:    98.8 F (37.1 C)  TempSrc:    Oral  SpO2:    99%  Weight:    82.2 kg  Height:        Inpatient medications: . albuterol  5 mg Nebulization Once  . alprazolam  2 mg Oral TID  . Chlorhexidine Gluconate Cloth  6 each Topical Q0600  . NIFEdipine  60 mg Oral QHS  . pantoprazole  40 mg Oral Daily  . sodium chloride flush  3 mL Intravenous Q12H  . sulfacetamide  1-2 drop Both Eyes Q6H    Exam: General: WDWN female, no distress, on HD, cramping Head: NCAT  Neck: Supple. no jvd Lungs: clear bilat to the bases Heart: RRR with S1 S2 Abdomen: soft NT + BS Lower extremities: No LE edema  Neuro: A & O  X 3. Moves all extremities spontaneously. Dialysis Access: LUE AVF +bruit   Labs: Basic Metabolic Panel: LastLabs     Recent Labs  Lab 06/02/18 1405  NA 135  K 6.4*  CL 96*  CO2 21*  GLUCOSE 85  BUN 71*  CREATININE 21.32*  CALCIUM 9.8     Liver Function Tests: LastLabs  Recent Labs  Lab 06/02/18 1405  AST 12*  ALT 5  ALKPHOS 50  BILITOT 0.6  PROT 8.6*  ALBUMIN 3.7       AF TTS  3h 53mins    400/800    84kg  2/2.25 bath   L AVF Hep 6600 Hectorol 3 mcg IV TIW Venofer 100mg  IV x 10 (has had 5/10) Mircera 225 mcg IV q 2 weeks (last 3/10)  Assessment/Plan: 1. Pulm edema/volume overload - 2/2 lean body wt loss  2. Hyperkalemia - resolved w/ HD 3. ESRD -  HD TTS. Extra HD today and HD tomorrow, get dry wt down as tolerated.  4. Hypertension  - BP's ok. No home BP meds.  5. Anemia  - Hgb 8.2. SP 5/10 Fe load doses, will cont. Next ESA due 3/24 6. Metabolic bone disease -  Continue home meds.    [facility] 06/03/2018, 12:04 PM  Recent Labs  Lab 06/02/18 1405  06/03/18 0231  NA 135 138  K 6.4* 3.7  CL 96* 98  CO2 21* 28  GLUCOSE 85 109*  BUN 71* 43*  CREATININE 21.32* 14.04*  CALCIUM 9.8 8.8*  ALBUMIN 3.7  --    Recent Labs  Lab 06/02/18 1405  AST 12*  ALT 5  ALKPHOS 50  BILITOT 0.6  PROT 8.6*   Recent Labs  Lab 06/02/18 1405 06/03/18 0751  WBC 7.7 4.6  NEUTROABS 5.7  --   HGB 8.2* 7.4*  HCT 27.6* 24.5*  MCV 89.0 90.1  PLT 311 282

## 2018-06-03 NOTE — Progress Notes (Addendum)
PROGRESS NOTE    Desiree [name]   HWT:[id number]  DOB: [birth year]  DOA: 06/02/2018 PCP: Harvie Junior, MD   Brief Narrative:  Desiree [name] is a 43 y/o with ESRD on HD, HTN, asthma and AOCD who presents with dyspnea and mid sternal chest pain and is found to be fluid overloaded. She was noted to be hypoxic in the ED and required 3 l O2.    Subjective: Her breathing is better now after her dialysis. She has no other complaints.     Assessment & Plan:   Principal Problem:   Acute respiratory failure/ fluid overload/ hyperkalemia/ ESRD   - due to fluid overload- she has undergone dialysis and will have another treatment tomorrow   Active Problems:    Anemia of chronic kidney failure - Hb today 7.4 - follow- will allow nephrology to treat   HTN - cont    Time spent in minutes: 35 DVT prophylaxis: SCDs Code Status: full code Family Communication: no family at bedside Disposition Plan: home tomorrow after dialysis Consultants:   nephrology Procedures:    Antimicrobials:  Anti-infectives (From admission, onward)   None       Objective: Vitals:   06/03/18 0930 06/03/18 0951 06/03/18 1000 06/03/18 1022  BP: 127/82 (!) 133/108 127/85 (!) 147/84  Pulse: 85 97 96 94  Resp:    (!) 29  Temp:    98.8 F (37.1 C)  TempSrc:    Oral  SpO2:    99%  Weight:    82.2 kg  Height:        Intake/Output Summary (Last 24 hours) at 06/03/2018 1327 Last data filed at 06/03/2018 1100 Gross per 24 hour  Intake 540 ml  Output 4925 ml  Net -4385 ml   Filed Weights   06/02/18 2016 06/03/18 0730 06/03/18 1022  Weight: 84.3 kg 84.5 kg 82.2 kg    Examination: General exam: Appears comfortable  HEENT: PERRLA, oral mucosa moist, no sclera icterus or thrush Respiratory system: Clear to auscultation. Respiratory effort normal. Cardiovascular system: S1 & S2 heard, RRR.   Gastrointestinal system: Abdomen soft, non-tender, nondistended. Normal bowel sounds. Central nervous  system: Alert and oriented. No focal neurological deficits. Extremities: No cyanosis, clubbing or edema Skin: No rashes or ulcers Psychiatry:  Mood & affect appropriate.     Data Reviewed: I have personally reviewed following labs and imaging studies  CBC: Recent Labs  Lab 06/02/18 1405 06/03/18 0751  WBC 7.7 4.6  NEUTROABS 5.7  --   HGB 8.2* 7.4*  HCT 27.6* 24.5*  MCV 89.0 90.1  PLT 311 917   Basic Metabolic Panel: Recent Labs  Lab 06/02/18 1405 06/03/18 0231  NA 135 138  K 6.4* 3.7  CL 96* 98  CO2 21* 28  GLUCOSE 85 109*  BUN 71* 43*  CREATININE 21.32* 14.04*  CALCIUM 9.8 8.8*   GFR: Estimated Creatinine Clearance: 5.5 mL/min (A) (by C-G formula based on SCr of 14.04 mg/dL (H)). Liver Function Tests: Recent Labs  Lab 06/02/18 1405  AST 12*  ALT 5  ALKPHOS 50  BILITOT 0.6  PROT 8.6*  ALBUMIN 3.7   No results for input(s): LIPASE, AMYLASE in the last 168 hours. No results for input(s): AMMONIA in the last 168 hours. Coagulation Profile: No results for input(s): INR, PROTIME in the last 168 hours. Cardiac Enzymes: No results for input(s): CKTOTAL, CKMB, CKMBINDEX, TROPONINI in the last 168 hours. BNP (last 3 results) No results for input(s): PROBNP in  the last 8760 hours. HbA1C: No results for input(s): HGBA1C in the last 72 hours. CBG: No results for input(s): GLUCAP in the last 168 hours. Lipid Profile: No results for input(s): CHOL, HDL, LDLCALC, TRIG, CHOLHDL, LDLDIRECT in the last 72 hours. Thyroid Function Tests: No results for input(s): TSH, T4TOTAL, FREET4, T3FREE, THYROIDAB in the last 72 hours. Anemia Panel: No results for input(s): VITAMINB12, FOLATE, FERRITIN, TIBC, IRON, RETICCTPCT in the last 72 hours. Urine analysis:    Component Value Date/Time   COLORURINE YELLOW 12/20/2013 0530   APPEARANCEUR CLOUDY (A) 12/20/2013 0530   LABSPEC 1.009 12/20/2013 0530   PHURINE 8.0 12/20/2013 0530   GLUCOSEU 100 (A) 12/20/2013 0530   HGBUR  LARGE (A) 12/20/2013 0530   BILIRUBINUR NEGATIVE 12/20/2013 0530   KETONESUR NEGATIVE 12/20/2013 0530   PROTEINUR 100 (A) 12/20/2013 0530   UROBILINOGEN 0.2 12/20/2013 0530   NITRITE NEGATIVE 12/20/2013 0530   LEUKOCYTESUR TRACE (A) 12/20/2013 0530   Sepsis Labs: @LABRCNTIP (procalcitonin:4,lacticidven:4) )No results found for this or any previous visit (from the past 240 hour(s)).       Radiology Studies: Dg Chest Port 1 View  Result Date: 06/02/2018 CLINICAL DATA:  Chest pain and shortness of Breath EXAM: PORTABLE CHEST 1 VIEW COMPARISON:  12/07/2017 FINDINGS: Cardiac shadow is enlarged but stable. The lungs again demonstrate hazy opacities bilaterally consistent with mild pulmonary edema. No sizable effusion is seen. No bony abnormality is noted. IMPRESSION: Changes consistent with mild CHF. Electronically Signed   By: Inez Catalina M.D.   On: 06/02/2018 13:27      Scheduled Meds: . albuterol  5 mg Nebulization Once  . alprazolam  2 mg Oral TID  . [START ON 06/04/2018] Chlorhexidine Gluconate Cloth  6 each Topical Q0600  . NIFEdipine  60 mg Oral QHS  . pantoprazole  40 mg Oral Daily  . sodium chloride flush  3 mL Intravenous Q12H  . sulfacetamide  1-2 drop Both Eyes Q6H   Continuous Infusions: . [START ON 06/04/2018] ferric gluconate (FERRLECIT/NULECIT) IV       LOS: 0 days      Debbe Odea, MD [facility] Pager: [email or web address] Password [email or web address] 06/03/2018, 1:27 PM

## 2018-06-03 NOTE — TOC Initial Note (Signed)
Transition of Care [facility]) - Initial/Assessment Note    Patient Details  Name: Desiree [name] MRN: [id number] Date of Birth: [birth year]  [facility] CM/SW Contact:    Zenon Mayo, RN Phone Number: 06/03/2018, 3:52 PM  Clinical Narrative:                 From home with spouse, ESRD, missed dialysis, has fluid overload, she has transportation at discharge, no DME at home, has no problem getting medications, has PCP Dr. Jimmye Norman.    Expected Discharge Plan: Home/Self Care Barriers to Discharge: No Barriers Identified   Patient Goals and CMS Choice Patient states their goals for this hospitalization and ongoing recovery are:: to get better   Choice offered to / list presented to : NA  Expected Discharge Plan and Services Expected Discharge Plan: Home/Self Care In-house Referral: NA Discharge Planning Services: CM Consult Post Acute Care Choice: NA Living arrangements for the past 2 months: Single Family Home                 DME Arranged: N/A DME Agency: NA HH Arranged: NA HH Agency: NA  Prior Living Arrangements/Services Living arrangements for the past 2 months: Single Family Home Lives with:: Spouse   Do you feel safe going back to the place where you live?: Yes               Activities of Daily Living Home Assistive Devices/Equipment: None ADL Screening (condition at time of admission) Patient's cognitive ability adequate to safely complete daily activities?: Yes Is the patient deaf or have difficulty hearing?: No Does the patient have difficulty seeing, even when wearing glasses/contacts?: No Does the patient have difficulty concentrating, remembering, or making decisions?: No Patient able to express need for assistance with ADLs?: Yes Does the patient have difficulty dressing or bathing?: No Independently performs ADLs?: Yes (appropriate for developmental age) Does the patient have difficulty walking or climbing stairs?: No Weakness of  Legs: None Weakness of Arms/Hands: None  Permission Sought/Granted                  Emotional Assessment   Attitude/Demeanor/Rapport: Engaged Affect (typically observed): Appropriate Orientation: : Oriented to Self, Oriented to Place, Oriented to  Time, Oriented to Situation   Psych Involvement: No (comment)  Admission diagnosis:  Hyperkalemia [E87.5] ESRD (end stage renal disease) on dialysis ([place]) [N18.6, Z99.2] Other hypervolemia [E87.79] Patient Active Problem List   Diagnosis Date Noted  . ESRD (end stage renal disease) on dialysis ([place]) 06/02/2018  . Acute respiratory failure ([place]) 06/02/2018  . Fever 12/07/2017  . Abdominal pain 08/03/2016  . Chronic pain 01/21/2016  . Muscle cramps 03/29/2015  . Anemia of chronic kidney failure 09/15/2013  . Cocaine abuse ([place]) 08/26/2013  . Depression 04/21/2012  . ESRD (end stage renal disease) ([place]) 08/03/2011   PCP:  Harvie Junior, MD Pharmacy:   [facility] [place] [place] [address] Phone: [phone] Fax: [phone]     Social Determinants of Health ([place]) Interventions    Readmission Risk Interventions Readmission Risk Prevention Plan 06/03/2018  Transportation Screening Complete  Medication Review ([place]) Complete  PCP or Specialist appointment within 3-5 days of discharge Complete  HRI or [place] Complete  SW Recovery Care/Counseling Consult Complete  [place] Not Applicable  Some recent data might be hidden

## 2018-06-04 LAB — RENAL FUNCTION PANEL
Albumin: 3 g/dL — ABNORMAL LOW (ref 3.5–5.0)
Anion gap: 13 (ref 5–15)
BUN: 31 mg/dL — ABNORMAL HIGH (ref 6–20)
CO2: 26 mmol/L (ref 22–32)
Calcium: 9.4 mg/dL (ref 8.9–10.3)
Chloride: 99 mmol/L (ref 98–111)
Creatinine, Ser: 10.85 mg/dL — ABNORMAL HIGH (ref 0.44–1.00)
GFR calc Af Amer: 5 mL/min — ABNORMAL LOW
GFR calc non Af Amer: 4 mL/min — ABNORMAL LOW
Glucose, Bld: 109 mg/dL — ABNORMAL HIGH (ref 70–99)
Phosphorus: 4.6 mg/dL (ref 2.5–4.6)
Potassium: 3.7 mmol/L (ref 3.5–5.1)
Sodium: 138 mmol/L (ref 135–145)

## 2018-06-04 LAB — CBC
HCT: 26.8 % — ABNORMAL LOW (ref 36.0–46.0)
Hemoglobin: 8.4 g/dL — ABNORMAL LOW (ref 12.0–15.0)
MCH: 27.8 pg (ref 26.0–34.0)
MCHC: 31.3 g/dL (ref 30.0–36.0)
MCV: 88.7 fL (ref 80.0–100.0)
Platelets: 310 K/uL (ref 150–400)
RBC: 3.02 MIL/uL — ABNORMAL LOW (ref 3.87–5.11)
RDW: 19.5 % — ABNORMAL HIGH (ref 11.5–15.5)
WBC: 4.9 K/uL (ref 4.0–10.5)
nRBC: 0 % (ref 0.0–0.2)

## 2018-06-04 MED ORDER — DIPHENHYDRAMINE HCL 25 MG PO CAPS
50.0000 mg | ORAL_CAPSULE | Freq: Once | ORAL | Status: AC
  Administered 2018-06-04: 50 mg via ORAL

## 2018-06-04 MED ORDER — DIPHENHYDRAMINE HCL 25 MG PO CAPS
ORAL_CAPSULE | ORAL | Status: AC
  Administered 2018-06-04: 13:00:00
  Filled 2018-06-04: qty 2

## 2018-06-04 MED ORDER — HEPARIN SODIUM (PORCINE) 1000 UNIT/ML DIALYSIS
6600.0000 [IU] | Freq: Once | INTRAMUSCULAR | Status: AC
  Administered 2018-06-04: 6600 [IU] via INTRAVENOUS_CENTRAL

## 2018-06-04 MED ORDER — HEPARIN SODIUM (PORCINE) 1000 UNIT/ML IJ SOLN
INTRAMUSCULAR | Status: AC
  Administered 2018-06-04: 6600 [IU] via INTRAVENOUS_CENTRAL
  Filled 2018-06-04: qty 7

## 2018-06-04 NOTE — Discharge Instructions (Signed)
Eating Plan for Dialysis Dialysis is a procedure that is done when the kidneys have stopped working properly (kidney failure). During dialysis, wastes, salt, and extra water are removed from the blood, and the levels of certain minerals in the blood are maintained. If you are undergoing dialysis, it is important to pay careful attention to what you eat. Between dialysis sessions, certain nutrients and wastes can build up in your blood and cause you to get sick. Even though nutrients, such as carbohydrates, fats, vitamins, and minerals, are an important part of a healthy diet, you may need to limit your intake of certain nutrients when you are on dialysis. When you are on dialysis, it is important that you work with your health care provider or a diet and nutrition specialist (dietitian) to help you make an eating plan that meets your specific needs. What are tips for following this plan? Reading food labels  Check food labels for the amount of: ? Potassium. This is found in milk, fruits, and vegetables. ? Phosphorus. This is found in milk, cheese, beans, nuts, and carbonated beverages. ? Sodium. Sodium content is high in processed and cured meats, ready-made frozen meals, canned vegetables, and salty snack foods.  Try to find foods that are low in potassium, phosphorus, and sodium.  Look for foods that are labeled "sodium free," "reduced sodium," or "low sodium." Shopping  Do not buy whole-grain and high-fiber foods because they contain high amounts of phosphorus.  Do not buy or use salt substitutes because they contain potassium.  Do not buy processed foods. These are usually high in sodium and phosphorus. Cooking  Drain all fluid from cooked vegetables and canned fruits before eating them.  Cut potatoes into small pieces and boil them in unsalted water before you eat them. This can help to remove some potassium from the potato.  To add flavor, try using herbs and spices that do not  contain sodium. Meal planning      Most people on dialysis should try to eat: ? 6-11 servings of grains each day. One serving is equal to 1 slice of bread or  cup of cooked rice or pasta. ? 2-3 servings of low-potassium vegetables each day. One serving is equal to  cup. ? 2-3 servings of low-potassium fruits each day. One serving is equal to  cup. ? High-quality proteins, such as meat, poultry, fish, and eggs. Talk with your health care provider or dietitian about the right amount and type of protein to include in your eating plan. ?  cup of dairy each day.  Avoid foods that are high in sodium and phosphorus. Foods that generally are very salty will be high in sodium, and foods that are high in fiber or starch may be high in phosphorus. General information  Follow your health care provider's instructions to restrict your fluid intake. You may be told to: ? Write down what you drink and any foods you eat that are made mostly from water, such as gelatin and soups. ? Drink from small cups to help control how much you drink.  Take vitamin and mineral supplements only as told by your health care provider.  Take over-the-counter and prescription medicines only as told by your health care provider. ? Your health care provider may recommend an over-the-counter medicine that binds phosphorus, such as an antacid medicine that contains calcium carbonate. What foods can I eat? Fruits Apples. Fresh or frozen berries. Fresh or canned pears, peaches, and pineapple. Grapes. Plums. Vegetables Fresh or  frozen broccoli, carrots, and green beans. Cabbage. Cauliflower. Celery. Cucumbers. Eggplant. Radishes. Zucchini. Grains White bread. White rice. Cooked cereal. Unsalted popcorn. Tortillas. Pasta. Meats and other proteins Fresh or frozen beef, pork, chicken, and fish. Eggs. Dairy Cream cheese. Heavy cream. Ricotta cheese. Beverages Apple cider. Cranberry juice. Grape juice. Lemonade. Black  coffee. Rice milk (that is not enriched or fortified). Condiments Herbs. Spices. Jam and jelly. Honey. Sweets and desserts Sherbet. Cakes. Cookies. Fats and oils Olive oil, canola oil, and safflower oil. Other Non-dairy creamer. Non-dairy whipped topping. Homemade broth without salt. The items listed above may not be a complete list of foods and beverages you can eat. Contact your dietitian for more options. What foods are not recommended? Fruits Star fruit. Bananas. Oranges. Kiwi. Nectarines. Prunes. Melon. Dried fruit. Avocado. Vegetables Potatoes. Beets. Tomatoes. Winter squash and pumpkin. Asparagus. Spinach. Parsnips. Grains Whole-grain bread. Whole-grain pasta. High-fiber cereal. Meats and other proteins Canned, smoked, and cured meats. Soil scientist. Sardines. Nuts and seeds. Peanut butter. Beans and legumes. Dairy Milk. Buttermilk. Yogurt. Cheese and cottage cheese. Processed cheese spreads. Beverages Orange juice. Prune juice. Carbonated soft drinks. Condiments Salt. Salt substitutes. Soy sauce. Sweets and desserts Ice cream. Chocolate. Candied nuts. Fats and oils Butter. Margarine. Other Ready-made frozen meals. Canned soups. The items listed above may not be a complete list of foods and beverages you should avoid. Contact your dietitian for more information. Summary  Dialysis is a procedure that is done when the kidneys have stopped working properly (kidney failure).  If you are undergoing dialysis, it is important to pay careful attention to what you eat. Between dialysis sessions, certain nutrients and wastes can build up in your blood and cause you to get sick.  Your dietitian will help you design an eating plan that is specific to your needs.  Avoid foods that are high in sodium and phosphorus. Restrict fluids as told by your health care provider or dietitian. This information is not intended to replace advice given to you by your health care provider.  Make sure you discuss any questions you have with your health care provider. Document Released: 11/28/2003 Document Revised: 05/19/2017 Document Reviewed: 03/03/2017 Elsevier Interactive Patient Education  2019 [place] [facility] Dialysis is a treatment that cleans your blood. It is used when your kidneys are damaged. When you need dialysis, you should watch what you eat. This is because some nutrients can build up in your blood between treatments and make you sick. Your doctor or diet specialist (dietitian) will:  Tell you what nutrients you should include or avoid.  Tell you how much of these nutrients you should get each day.  Help you plan meals.  Tell you how much to drink each day. What are tips for following this plan? Reading food labels  Check food labels for: ? Potassium. This is found in milk, fruits, and vegetables. ? Phosphorus. This is found in milk, cheese, beans, nuts, and carbonated beverages. ? Salt (sodium). This is in processed meats, cured meats, ready-made frozen meals, canned vegetables, and salty snack foods.  Try to find foods that are low in potassium, phosphorus, and sodium.  Look for foods that are labeled "sodium free," "reduced sodium," or "low sodium." Shopping  Do not buy whole-grain and high-fiber foods.  Do not buy or use salt substitutes.  Do not buy processed foods. Cooking  Drain all fluid from cooked vegetables and canned fruits before you eat them.  Before you cook potatoes,  cut them into small pieces. Then boil them in unsalted water.  Try using herbs and spices that do not contain sodium to add flavor. Meal planning Most people on dialysis should try to eat:  6-11 servings of grains each day. One serving is equal to 1 slice of bread or  cup of cooked rice or pasta.  2-3 servings of low-potassium vegetables each day. One serving is equal to  cup.  2-3 servings of low-potassium fruits each day. One serving  is equal to  cup.  Protein, such as meat, poultry, fish, and eggs. Talk with your doctor or dietitian about the right amount and type of protein to eat.   cup of dairy each day. General information  Follow your doctor's instructions about how much to drink. You may be told to: ? Write down what you drink. ? Write down the foods you eat that are made mostly from water, such as gelatin and soups. ? Drink from small cups.  Take vitamin and mineral supplements only as told by your doctor.  Take over-the-counter and prescription medicines only as told by your doctor. What foods can I eat?     Fruits Apples. Fresh or frozen berries. Fresh or canned pears, peaches, and pineapple. Grapes. Plums. Vegetables Fresh or frozen broccoli, carrots, and green beans. Cabbage. Cauliflower. Celery. Cucumbers. Eggplant. Radishes. Zucchini. Grains White bread. White rice. Cooked cereal. Unsalted popcorn. Tortillas. Pasta. Meats and other proteins Fresh or frozen beef, pork, chicken, and fish. Eggs. Dairy Cream cheese. Heavy cream. Ricotta cheese. Beverages Apple cider. Cranberry juice. Grape juice. Lemonade. Black coffee. Rice milk (that is not enriched or fortified). Seasonings and condiments Herbs. Spices. Jam and jelly. Honey. Sweets and desserts Sherbet. Cakes. Cookies. Fats and oils Olive oil, canola oil, and safflower oil. Other foods Non-dairy creamer. Non-dairy whipped topping. Homemade broth without salt. The items listed above may not be a complete list of foods and beverages you can eat. Contact your dietitian for more options. What foods should I avoid? Fruits Star fruit. Bananas. Oranges. Kiwi. Nectarines. Prunes. Melon. Dried fruit. Avocado. Vegetables Potatoes. Beets. Tomatoes. Winter squash and pumpkin. Asparagus. Spinach. Parsnips. Grains Whole-grain bread. Whole-grain pasta. High-fiber cereal. Meats and other proteins Canned, smoked, and cured meats. Packaged lunch meat.  Sardines. Nuts and seeds. Peanut butter. Beans and legumes. Dairy Milk. Buttermilk. Yogurt. Cheese and cottage cheese. Processed cheese spreads. Beverages Orange juice. Prune juice. Carbonated soft drinks. Seasonings and condiments Salt. Salt substitutes. Soy sauce. Sweets and desserts Ice cream. Chocolate. Candied nuts. Fats and oils Butter. Margarine. Other foods Ready-made frozen meals. Canned soups. The items listed above may not be a complete list of foods and beverages you should avoid. Contact your dietitian for more information. Summary  If you are having dialysis, it is important to watch what you eat. Certain nutrients and wastes can build up in your blood and cause you to get sick.  Your dietitian will help you make an eating plan that meets your needs.  Avoid foods that are high in potassium, salt (sodium), and phosphorus. Restrict fluids as told by your doctor or dietitian. This information is not intended to replace advice given to you by your health care provider. Make sure you discuss any questions you have with your health care provider. Document Released: 09/01/2011 Document Revised: 05/19/2017 Document Reviewed: 03/03/2017 Elsevier Interactive Patient Education  2019 [place].   Fluid Restriction With some health conditions, you must restrict your fluid intake. This means that you need to limit  the amount of fluid that you drink each day (fluid restriction). When you have a fluid restriction, you must carefully measure and keep track of the amount of fluid that you drink. Your health care provider will identify the specific amount of fluid you are allowed each day (fluid allowance). This amount may depend on several things, such as:  How well your kidneys function.  How much fluid you are keeping (retaining) in your body tissues.  Your blood pressure.  Your heart function.  Your blood sodium level. What is my plan? Your health care provider recommends  that you limit your fluid intake to __________ per day. What counts toward my fluid intake? Your fluid intake includes all liquids that you drink, as well as any foods that become liquid at room temperature. The following are examples of some fluids that you will have to restrict:  Tea, coffee, soda, lemonade, milk, water, juice, sports drinks, and nutritional supplement beverages.  Alcoholic beverages.  Cream.  Gravy.  Ice cubes.  Soup and broth. The following are examples of foods that become liquid at room temperature. These foods will also count toward your fluid intake.  Ice cream and ice milk.  Frozen yogurt and sherbet.  Frozen ice pops.  Flavored gelatin. How do I keep track of my fluid intake? Each morning, fill a jug with the amount of water that is equal to your daily fluid allowance. You can use this water as a guideline for fluid allowance. Each time you take in any form of fluid (including ice cubes and foods that become liquid at room temperature), pour an equal amount of water out of the container. This helps you to see how much fluid you are taking in. It also helps you to see how much more fluid you can take in during the rest of the day. The following conversions may also be helpful in measuring your fluid intake:  1 cup equals 8 oz (240 mL).   cup equals 6 oz (180 mL).  ? cup equals 5? oz (160 mL).   cup equals 4 oz (120 mL).  ? cup equals 2? oz (80 mL).   cup equals 2 oz (60 mL).  2 Tbsp equals 1 oz (30 mL). What are tips for following this plan? General instructions  Make sure that you stay within your recommended fluid allowance each day. Always measure and keep track of your fluids (including ice cubes and foods that become liquid at room temperature).  Use small cups and glasses and learn to sip fluids slowly.  Try frozen fruits between meals, such as grapes or strawberries. These can satisfy thirst without adding to your fluid  intake.  Swallow your pills along with meals or soft foods such as applesauce or mashed potatoes, instead of with liquids. Doing this helps you to save your fluid allowance for something that you enjoy. Weigh yourself each day     Weigh yourself every day. Keeping track of your daily weight can help you and your health care provider to notice as soon as possible if you are retaining too much fluid in your body.  Follow this sequence every morning: 1. Urinate. 2. Weigh yourself. 3. Eat breakfast.  Wear the same amount of clothing each time you weigh yourself.  Write down your daily weight. Give this weight record to your health care provider. If your weight is going up, you may be retaining too much fluid. Every 1 lb (0.45 kg) of body weight that you gain  is a sign that your body is retaining 2 cups (480 mL) of fluid.  Manage your thirst  Add lemon juice or a slice of fresh lemon to water or ice. Doing this helps to satisfy your thirst.  Freeze fruit juice or water in an ice cube tray. Use this as part of your fluid allowance. These cubes are useful for quenching your thirst. Before you freeze the juice or water, measure how much liquid you use to fill a cube section of the ice tray. Subtract this amount from your day's allowance each time you consume a frozen cube.  Avoid salty (high-sodium) foods. These foods make you thirsty and make it more difficult to stay within your daily fluid allowance.  Keep the temperature in your home at a cooler level.  Keep the air in your home as humid as possible. Dry air increases thirst.  Avoid being out in the hot sun, which can cause you to sweat and become thirsty.  To help avoid dry mouth, brush your teeth often or rinse out your mouth with mouthwash. Lemon wedges, hard sour candies, chewing gum, or breath spray may also help to moisten your mouth. What are some signs that I may be taking in too much fluid? You may be taking in too much fluid  if:  Your weight increases. Contact your health care provider if you gain weight rapidly.  Your face, hands, legs, feet, and abdomen start to swell.  You have trouble breathing. Summary  With some health conditions, you must limit (restrict) your fluid intake. This means that you need to limit the amount of fluid you drink each day (fluid restriction). Your health care provider will identify the specific amount of fluid that you are allowed each day.  When you have a fluid restriction, you must carefully measure and keep track of the amount of fluid that you drink.  Your fluid intake includes all liquids that you drink, as well as any foods that become liquid at room temperature (such as ice cream and gelatin).  You may be taking in too much fluid if your weight increases, your body starts to swell, or you have trouble breathing. This information is not intended to replace advice given to you by your health care provider. Make sure you discuss any questions you have with your health care provider. Document Released: 12/28/2006 Document Revised: 11/04/2016 Document Reviewed: 11/04/2016 Elsevier Interactive Patient Education  2019 [employer].   Hemodialysis Hemodialysis is a way of removing wastes, salt, and extra water from your blood. It also helps keep healthy mineral levels in your blood. It is done when your kidneys no longer work properly and cannot keep your blood clean. During hemodialysis, your blood travels outside of your body through a tube to a machine (dialyzer). A filter in the machine cleans the blood and the blood returns to your body through a tube. It is usually done 3 times a week. Visits last 3-5 hours. What happens before the procedure?        You will need a procedure to make an opening (vascular access). This is where blood is removed and returned to the body. There are three types:  Arteriovenous fistula. An artery and a vein are connected. This is usually in  the arm.  Arteriovenous graft. An artery and a vein in the arm are connected with a tube.  Venous catheter. A tube (catheter) is placed in a vein in your neck, chest, or groin. What happens during the  procedure?   Your weight, blood pressure, pulse, and temperature will be taken.  The skin around your access will be cleaned.  Your access will be connected to the machine. ? If you have a fistula or graft, two needles will be placed through it. They will be connected to a tube. The tube will be connected to the machine. The needles will be taped to your skin. This keeps them from moving. ? If you have a tube (catheter), it will be connected to a plastic tube. That plastic tube will then be connected to the machine.  Your blood will go through the tube to the machine. The machine will clean your blood. Then your blood will go back to your body through a tube. Your blood pressure and pulse will be checked a few times while the machine is running.  When dialysis is done, you will be disconnected from the machine. If you have a fistula or graft, the needles will be removed and a bandage (dressing) will be put on the access. If you have a tube, the tube connected to the machine will be removed. Hemodialysis is done while you are sitting or reclining. You may sleep, read, or do other tasks that can be done in this position. If you have side effects, tell your doctor. The procedure may vary with doctors or hospitals. What happens after the procedure?  You will be weighed.  Your blood will be tested. This is usually done once a month.  You may have side effects. These may include: ? Dizziness. ? Muscle cramps. ? Feeling sick to your stomach (nausea). ? Headaches. ? Allergic reaction. Summary  Hemodialysis is a way of removing wastes, salt, and extra water from your blood.  Before the procedure, you will need an opening where blood is removed from the body and returned to the body. This is  done with a procedure.  During hemodialysis, your blood travels outside of your body through a tube to a machine. A filter in the machine cleans the blood. This information is not intended to replace advice given to you by your health care provider. Make sure you discuss any questions you have with your health care provider. Document Released: 02/13/2008 Document Revised: 04/07/2016 Document Reviewed: 04/07/2016 Elsevier Interactive Patient Education  2019 [employer].

## 2018-06-04 NOTE — Discharge Summary (Signed)
Discharge Summary  Desiree [name] QIH:[id number] DOB: 11-10-1975  PCP: Harvie Junior, MD  Admit date: 06/02/2018 Discharge date: 06/04/2018  Time spent: 35 minutes  Recommendations for Outpatient Follow-up:  1. Follow-up with nephrology 2. Follow-up with your PCP 3. Take your medication as prescribed  Discharge Diagnoses:  Active Hospital Problems   Diagnosis Date Noted  . Acute respiratory failure ([place]) 06/02/2018  . ESRD (end stage renal disease) on dialysis ([place]) 06/02/2018  . Anemia of chronic kidney failure 09/15/2013  . ESRD (end stage renal disease) ([place]) 08/03/2011    Resolved Hospital Problems  No resolved problems to display.    Discharge Condition: Stable  Diet recommendation: Resume previous diet; renal dialysis diet  Vitals:   06/04/18 1100 06/04/18 1248  BP: 108/77 112/82  Pulse: 96 95  Resp:  (!) 23  Temp:  98.7 F (37.1 C)  SpO2:  100%    History of present illness:   Desiree [name] is a 43 y/o with ESRD on HD, HTN, asthma and AOCD who presents with dyspnea and mid sternal chest pain and is found to be fluid overloaded. She was noted to be hypoxic in the ED and required 3 l O2.   Admitted for acute hypoxic respiratory failure secondary to fluid overload in the setting of ESRD on HD.  06/04/18: Seen and examined at bedside.  No acute events overnight.  She has no new complaints.  She denies dyspnea, palpitations or chest pain.  Completed hemodialysis today without complication.  On the day of discharge, the patient was hemodynamically stable.  She will need to follow-up with nephrology and her PCP post hospitalization.  She will also need to abide by a renal hemodialysis diet.  Hospital Course:  Principal Problem:   Acute respiratory failure (HCC) Active Problems:   ESRD (end stage renal disease) ([place])   Anemia of chronic kidney failure   ESRD (end stage renal disease) on dialysis (HCC)  Acute hypoxic respiratory failure secondary to fluid  overload in the setting of end-stage renal disease on dialysis Has undergone dialysis on 06/03/2018 and 06/04/2018 Symptoms have resolved  Anemia of chronic disease secondary to ESRD Hemoglobin stable Follow-up nephrology outpatient  Hypertension Blood pressure is normotensive Continue home medications   Code Status: full code  Consultants:   nephrology Procedures:    Antimicrobials:     Anti-infectives (From admission, onward)   None     Discharge Exam: BP 112/82 (BP Location: Right Arm)   Pulse 95   Temp 98.7 F (37.1 C) (Oral)   Resp (!) 23   Ht 5\' 5"  (1.651 m)   Wt 83.3 kg   LMP 05/25/2018   SpO2 100%   BMI 30.56 kg/m  . General: 43 y.o. year-old female well developed well nourished in no acute distress.  Alert and oriented x3. . Cardiovascular: Regular rate and rhythm with no rubs or gallops.  No thyromegaly or JVD noted.   Marland Kitchen Respiratory: Clear to auscultation with no wheezes or rales. Good inspiratory effort. . Abdomen: Soft nontender nondistended with normal bowel sounds x4 quadrants. . Musculoskeletal: No lower extremity edema. 2/4 pulses in all 4 extremities. . Skin: No ulcerative lesions noted or rashes, . Psychiatry: Mood is appropriate for condition and setting  Discharge Instructions You were cared for by a hospitalist during your hospital stay. If you have any questions about your discharge medications or the care you received while you were in the hospital after you are discharged, you can call the  unit and asked to speak with the hospitalist on call if the hospitalist that took care of you is not available. Once you are discharged, your primary care physician will handle any further medical issues. Please note that NO REFILLS for any discharge medications will be authorized once you are discharged, as it is imperative that you return to your primary care physician (or establish a relationship with a primary care physician if you do not have one) for  your aftercare needs so that they can reassess your need for medications and monitor your lab values.   Allergies as of 06/04/2018      Reactions   Morphine Shortness Of Breath, Anaphylaxis   Prednisone Other (See Comments)   Other reaction(s): Other (See Comments) Muscle spasms Patient says prednisone causes her to cramp all over, muscle spasms uncontrolled   Tuna [fish Allergy] Itching, Swelling, Rash, Other (See Comments)   Face droops also   Amlodipine    Angioedema (09/05/17 ED visit)   Iodinated Diagnostic Agents Itching   Tape Itching      Medication List    STOP taking these medications   alprazolam 2 MG tablet Commonly known as:  XANAX   Oxycodone HCl 10 MG Tabs     TAKE these medications   albuterol 108 (90 Base) MCG/ACT inhaler Commonly known as:  PROVENTIL HFA;VENTOLIN HFA Inhale 2 puffs into the lungs every 4 (four) hours as needed for wheezing or shortness of breath.   calcium acetate 667 MG capsule Commonly known as:  PHOSLO Take 667-1,334 mg by mouth See admin instructions. Take 3 capsules with meals and 1 to 2 with snacks   cinacalcet 30 MG tablet Commonly known as:  SENSIPAR Take 60 mg by mouth every evening.   diphenhydrAMINE 25 MG tablet Commonly known as:  BENADRYL Take 1 tablet (25 mg total) by mouth every 6 (six) hours.   NIFEdipine 30 MG 24 hr tablet Commonly known as:  ADALAT CC Take 60 mg by mouth at bedtime.   omeprazole 20 MG capsule Commonly known as:  PRILOSEC Take 20 mg by mouth daily.   ondansetron 8 MG disintegrating tablet Commonly known as:  ZOFRAN-ODT Take 8 mg by mouth every 8 (eight) hours as needed for nausea or vomiting.   sulfacetamide 10 % ophthalmic solution Commonly known as:  BLEPH-10 Place 1-2 drops into both eyes every 6 (six) hours.      Allergies  Allergen Reactions  . Morphine Shortness Of Breath and Anaphylaxis  . Prednisone Other (See Comments)    Other reaction(s): Other (See Comments) Muscle spasms  Patient says prednisone causes her to cramp all over, muscle spasms uncontrolled  . Geralyn Flash [Fish Allergy] Itching, Swelling, Rash and Other (See Comments)    Face droops also  . Amlodipine     Angioedema (09/05/17 ED visit)  . Iodinated Diagnostic Agents Itching  . Tape Itching   Follow-up Information    Harvie Junior, MD Follow up on 06/14/2018.   Specialty:  Family Medicine Why:  has an apt already Contact information: [place] [address] [phone]            The results of significant diagnostics from this hospitalization (including imaging, microbiology, ancillary and laboratory) are listed below for reference.    Significant Diagnostic Studies: Dg Chest Port 1 View  Result Date: 06/02/2018 CLINICAL DATA:  Chest pain and shortness of Breath EXAM: PORTABLE CHEST 1 VIEW COMPARISON:  12/07/2017 FINDINGS: Cardiac shadow is enlarged but  stable. The lungs again demonstrate hazy opacities bilaterally consistent with mild pulmonary edema. No sizable effusion is seen. No bony abnormality is noted. IMPRESSION: Changes consistent with mild CHF. Electronically Signed   By: Inez Catalina M.D.   On: 06/02/2018 13:27    Microbiology: No results found for this or any previous visit (from the past 240 hour(s)).   Labs: Basic Metabolic Panel: Recent Labs  Lab 06/02/18 1405 06/03/18 0231 06/04/18 0653  NA 135 138 138  K 6.4* 3.7 3.7  CL 96* 98 99  CO2 21* 28 26  GLUCOSE 85 109* 109*  BUN 71* 43* 31*  CREATININE 21.32* 14.04* 10.85*  CALCIUM 9.8 8.8* 9.4  PHOS  --   --  4.6   Liver Function Tests: Recent Labs  Lab 06/02/18 1405 06/04/18 0653  AST 12*  --   ALT 5  --   ALKPHOS 50  --   BILITOT 0.6  --   PROT 8.6*  --   ALBUMIN 3.7 3.0*   No results for input(s): LIPASE, AMYLASE in the last 168 hours. No results for input(s): AMMONIA in the last 168 hours. CBC: Recent Labs  Lab 06/02/18 1405 06/03/18 0751 06/04/18 0653  WBC 7.7 4.6 4.9   NEUTROABS 5.7  --   --   HGB 8.2* 7.4* 8.4*  HCT 27.6* 24.5* 26.8*  MCV 89.0 90.1 88.7  PLT 311 282 310   Cardiac Enzymes: No results for input(s): CKTOTAL, CKMB, CKMBINDEX, TROPONINI in the last 168 hours. BNP: BNP (last 3 results) No results for input(s): BNP in the last 8760 hours.  ProBNP (last 3 results) No results for input(s): PROBNP in the last 8760 hours.  CBG: No results for input(s): GLUCAP in the last 168 hours.     Signed:  Kayleen Memos, MD [facility] 06/04/2018, 12:53 PM

## 2018-06-04 NOTE — Progress Notes (Signed)
[facility] Progress Note  Subjective: doing well, on HD, goal 2.5 L.    Vitals:   06/04/18 0930 06/04/18 1000 06/04/18 1030 06/04/18 1100  BP: 122/82 121/80 111/76 108/77  Pulse: 97 98 97 96  Resp:      Temp:      TempSrc:      SpO2:      Weight:      Height:        Inpatient medications: . albuterol  5 mg Nebulization Once  . alprazolam  2 mg Oral TID  . calcium acetate  2,001 mg Oral TID WC  . Chlorhexidine Gluconate Cloth  6 each Topical Q0600  . diphenhydrAMINE      . NIFEdipine  60 mg Oral QHS  . pantoprazole  40 mg Oral Daily  . sevelamer carbonate  1,600 mg Oral TID WC  . sodium chloride flush  3 mL Intravenous Q12H  . sulfacetamide  1-2 drop Both Eyes Q6H   . ferric gluconate (FERRLECIT/NULECIT) IV 125 mg (06/04/18 1054)   Exam: General: WDWN female, no distress, on HD, cramping Head: NCAT  Neck: Supple. no jvd Lungs: clear bilat to the bases Heart: RRR with S1 S2 Abdomen: soft NT + BS Lower extremities: No LE edema  Neuro: A & O  X 3. Moves all extremities spontaneously. Dialysis Access: LUE AVF +bruit   Labs: Basic Metabolic Panel: LastLabs     Recent Labs  Lab 06/02/18 1405  NA 135  K 6.4*  CL 96*  CO2 21*  GLUCOSE 85  BUN 71*  CREATININE 21.32*  CALCIUM 9.8     Liver Function Tests: LastLabs  Recent Labs  Lab 06/02/18 1405  AST 12*  ALT 5  ALKPHOS 50  BILITOT 0.6  PROT 8.6*  ALBUMIN 3.7       AF TTS  3h 79mins    400/800    84kg  2/2.25 bath   L AVF Hep 6600 Hectorol 3 mcg IV TIW Venofer 100mg  IV x 10 (has had 5/10) Mircera 225 mcg IV q 2 weeks (last 3/10)  Assessment/Plan: 1. Pulm edema/volume overload - 2/2 lean body wt loss in an esrd patient.  We will be lowering dry wt based on post HD weights today. OK for dc after HD today.  2. Hyperkalemia - resolved w/ HD 3. ESRD -  HD TTS. Extra HD today and HD tomorrow, get dry wt down as tolerated.  4. Hypertension  - BP's ok. No home BP meds.   5. Anemia  - Hgb 8.2. SP 5/10 Fe load doses, will cont. Next ESA due 3/24 6. Metabolic bone disease -  Continue home meds.    [facility] 06/04/2018, 12:19 PM  Recent Labs  Lab 06/02/18 1405 06/03/18 0231 06/04/18 0653  NA 135 138 138  K 6.4* 3.7 3.7  CL 96* 98 99  CO2 21* 28 26  GLUCOSE 85 109* 109*  BUN 71* 43* 31*  CREATININE 21.32* 14.04* 10.85*  CALCIUM 9.8 8.8* 9.4  PHOS  --   --  4.6  ALBUMIN 3.7  --  3.0*   Recent Labs  Lab 06/02/18 1405  AST 12*  ALT 5  ALKPHOS 50  BILITOT 0.6  PROT 8.6*   Recent Labs  Lab 06/02/18 1405 06/03/18 0751 06/04/18 0653  WBC 7.7 4.6 4.9  NEUTROABS 5.7  --   --   HGB 8.2* 7.4* 8.4*  HCT 27.6* 24.5* 26.8*  MCV 89.0 90.1 88.7  PLT 311 282 310

## 2018-06-04 NOTE — Progress Notes (Signed)
Discharged: Patient left unit with all belongings accompanied by daughter. Discharge instructions regarding renal diet, medications, and follow up appointments discussed. Patient and daughter verbalize an understanding of discharge instructions.

## 2019-01-09 ENCOUNTER — Other Ambulatory Visit: Payer: Self-pay

## 2019-01-09 DIAGNOSIS — N186 End stage renal disease: Secondary | ICD-10-CM

## 2019-01-11 ENCOUNTER — Other Ambulatory Visit: Payer: Self-pay | Admitting: *Deleted

## 2019-01-11 ENCOUNTER — Encounter: Payer: Self-pay | Admitting: *Deleted

## 2019-01-11 ENCOUNTER — Ambulatory Visit (INDEPENDENT_AMBULATORY_CARE_PROVIDER_SITE_OTHER)
Admission: RE | Admit: 2019-01-11 | Discharge: 2019-01-11 | Disposition: A | Discharge status: Home / Self Care | Payer: Medicare Other | Source: Ambulatory Visit | Attending: Family | Admitting: Family

## 2019-01-11 ENCOUNTER — Ambulatory Visit (HOSPITAL_COMMUNITY)
Admission: RE | Admit: 2019-01-11 | Discharge: 2019-01-11 | Disposition: A | Discharge status: Home / Self Care | Payer: Medicare Other | Source: Ambulatory Visit | Attending: Family | Admitting: Family

## 2019-01-11 ENCOUNTER — Ambulatory Visit (INDEPENDENT_AMBULATORY_CARE_PROVIDER_SITE_OTHER): Payer: Medicare Other | Admitting: Vascular Surgery

## 2019-01-11 ENCOUNTER — Other Ambulatory Visit: Payer: Self-pay

## 2019-01-11 ENCOUNTER — Encounter: Payer: Self-pay | Admitting: Vascular Surgery

## 2019-01-11 VITALS — BP 139/86 | HR 72 | Temp 97.4°F | Resp 20 | Ht 65.0 in | Wt 211.8 lb

## 2019-01-11 DIAGNOSIS — N186 End stage renal disease: Secondary | ICD-10-CM

## 2019-01-11 NOTE — H&P (View-Only) (Signed)
REASON FOR CONSULT:    To evaluate for hemodialysis access.  The consult is requested by Dr. Otelia Santee.  ASSESSMENT & PLAN:   END-STAGE RENAL DISEASE: This patient has an occluded left radiocephalic fistula which is not salvageable.  She has a functioning catheter.  Her only other option for access in the left arm appears to be either an upper arm brachiocephalic fistula although this vein is marginal in size.  If the upper arm cephalic vein is not adequate I think she could have an AV graft.  She has a high bifurcation of the brachial artery on the right and therefore is more likely to have problems on the right unless she has an upper arm loop.  Regardless her veins on the right do not appear to be usable for a fistula.  She is scheduled for a left brachiocephalic fistula or AV graft on 01/20/2019.  I have discussed the indications for the procedure and the potential complications and she is agreeable to proceed.  [name], MD Office: [phone]   HPI:   Desiree [name] is a pleasant 43 y.o. female, who has had a left radiocephalic fistula for approximately 5 years.  This has been worked on up multiple times and ultimately occluded and could no longer be salvaged.  She had a tunneled dialysis catheter placed in July.  This has been working fine.  She presents for evaluation for new access.  Of note she does her own dialysis and cannulate her fistula herself.  She denies any recent uremic symptoms.  Specifically, she denies nausea, vomiting, fatigue, anorexia, or palpitations.  Past Medical History:  Diagnosis Date  . Anemia   . Anxiety   . Arthritis    bilateral knees  . Childhood asthma   . Complication of anesthesia    "sometimes it does not work; didn't during [place]" (01/21/2016)  . ESRD (end stage renal disease) on dialysis ([place])    "TTS; [facility]; [facility]" (08/03/2016)  . GERD (gastroesophageal reflux disease)    nexium prn  . Gout   . History of blood  transfusion    "related to kidneys; I've had 4" (01/21/2016)  . Hypertension   . Migraine    "qod now" (01/21/2016)  . Preterm labor ~ 2014  . Renal insufficiency   . Retina hole, left   . Seizures ([place])    "last one was in 2000; related to preeclampsia" (01/21/2016)    Family History  Problem Relation Age of Onset  . Diabetes Mother   . Hyperlipidemia Mother   . Hypertension Mother   . Heart disease Mother   . Hypertension Other   . Diabetes type II Other     SOCIAL HISTORY: Social History   Socioeconomic History  . Marital status: Married    Spouse name: Not on file  . Number of children: Not on file  . Years of education: Not on file  . Highest education level: Not on file  Occupational History  . Not on file  Social Needs  . Financial resource strain: Not on file  . Food insecurity    Worry: Not on file    Inability: Not on file  . Transportation needs    Medical: Not on file    Non-medical: Not on file  Tobacco Use  . Smoking status: Passive Smoke Exposure - Never Smoker  . Smokeless tobacco: Never Used  Substance and Sexual Activity  . Alcohol use: No  . Drug use: Yes  Types: Marijuana    Comment: 05/25/2018 "maybe 2 months"  . Sexual activity: Yes    Birth control/protection: None  Lifestyle  . Physical activity    Days per week: Not on file    Minutes per session: Not on file  . Stress: Not on file  Relationships  . Social Herbalist on phone: Not on file    Gets together: Not on file    Attends religious service: Not on file    Active member of club or organization: Not on file    Attends meetings of clubs or organizations: Not on file    Relationship status: Not on file  . Intimate partner violence    Fear of current or ex partner: Not on file    Emotionally abused: Not on file    Physically abused: Not on file    Forced sexual activity: Not on file  Other Topics Concern  . Not on file  Social History Narrative  . Not on file     Allergies  Allergen Reactions  . Morphine Shortness Of Breath and Anaphylaxis  . Prednisone Other (See Comments)    Other reaction(s): Other (See Comments) Muscle spasms Patient says prednisone causes her to cramp all over, muscle spasms uncontrolled  . Geralyn Flash [Fish Allergy] Itching, Swelling, Rash and Other (See Comments)    Face droops also  . Amlodipine     Angioedema (09/05/17 ED visit)  . Iodinated Diagnostic Agents Itching  . Tape Itching    Current Outpatient Medications  Medication Sig Dispense Refill  . albuterol (PROVENTIL HFA;VENTOLIN HFA) 108 (90 Base) MCG/ACT inhaler Inhale 2 puffs into the lungs every 4 (four) hours as needed for wheezing or shortness of breath. 1 Inhaler 2  . alprazolam (XANAX) 2 MG tablet Take 2 mg by mouth 3 (three) times daily as needed.    . calcium acetate (PHOSLO) 667 MG capsule Take 667-1,334 mg by mouth See admin instructions. Take 3 capsules with meals and 1 to 2 with snacks  4  . cinacalcet (SENSIPAR) 30 MG tablet Take 60 mg by mouth every evening.     . diphenhydrAMINE (BENADRYL) 25 MG tablet Take 1 tablet (25 mg total) by mouth every 6 (six) hours. 20 tablet 0  . NARCAN 4 MG/0.1ML LIQD nasal spray kit 1 spray once.    Marland Kitchen NIFEdipine (ADALAT CC) 30 MG 24 hr tablet Take 60 mg by mouth at bedtime.    Marland Kitchen omeprazole (PRILOSEC) 20 MG capsule Take 20 mg by mouth daily.    . Oxycodone HCl 10 MG TABS Take 10 mg by mouth 3 (three) times daily as needed.     No current facility-administered medications for this visit.     REVIEW OF SYSTEMS:  '[X]'$  denotes positive finding, '[ ]'$  denotes negative finding Cardiac  Comments:  Chest pain or chest pressure:    Shortness of breath upon exertion:    Short of breath when lying flat: x   Irregular heart rhythm:        Vascular    Pain in calf, thigh, or hip brought on by ambulation:    Pain in feet at night that wakes you up from your sleep:     Blood clot in your veins:    Leg swelling:          Pulmonary    Oxygen at home:    Productive cough:     Wheezing:         Neurologic  Sudden weakness in arms or legs:     Sudden numbness in arms or legs:     Sudden onset of difficulty speaking or slurred speech:    Temporary loss of vision in one eye:     Problems with dizziness:         Gastrointestinal    Blood in stool:     Vomited blood:         Genitourinary    Burning when urinating:     Blood in urine:        Psychiatric    Major depression:         Hematologic    Bleeding problems:    Problems with blood clotting too easily:        Skin    Rashes or ulcers:        Constitutional    Fever or chills:     PHYSICAL EXAM:   Vitals:   01/11/19 1343  BP: 139/86  Pulse: 72  Resp: 20  Temp: (!) 97.4 F (36.3 C)  SpO2: 99%  Weight: 211 lb 12.8 oz (96.1 kg)  Height: '5\' 5"'$  (1.651 m)    GENERAL: The patient is a well-nourished female, in no acute distress. The vital signs are documented above. CARDIAC: There is a regular rate and rhythm.  VASCULAR: I do not detect carotid bruits. She has a palpable brachial and radial pulse bilaterally. PULMONARY: There is good air exchange bilaterally without wheezing or rales. ABDOMEN: Soft and non-tender with normal pitched bowel sounds.  MUSCULOSKELETAL: There are no major deformities or cyanosis. NEUROLOGIC: No focal weakness or paresthesias are detected. SKIN: There are no ulcers or rashes noted. PSYCHIATRIC: The patient has a normal affect.  DATA:    UPPER EXTREMITY VEIN MAP: I have independently interpreted the patient's upper extremity vein map.  She does not appear to have a usable vein in the right arm for a fistula.  On the left side the upper arm cephalic vein has diameters range from 0.2 to 0.33 cm.  It is dilated at the antecubital level to 0.51.  The basilic vein on the left does not appear to be adequate for a fistula.  UPPER EXTREMITY ARTERIAL DUPLEX: I have independently interpreted the patient's  upper extremity arterial duplex.  On the right side there is a high bifurcation of the brachial artery.  On the left side the artery bifurcates at the antecubital level.  There is a triphasic radial and ulnar signal on the left.

## 2019-01-11 NOTE — Progress Notes (Signed)
REASON FOR CONSULT:    To evaluate for hemodialysis access.  The consult is requested by Dr. Otelia Santee.  ASSESSMENT & PLAN:   END-STAGE RENAL DISEASE: This patient has an occluded left radiocephalic fistula which is not salvageable.  She has a functioning catheter.  Her only other option for access in the left arm appears to be either an upper arm brachiocephalic fistula although this vein is marginal in size.  If the upper arm cephalic vein is not adequate I think she could have an AV graft.  She has a high bifurcation of the brachial artery on the right and therefore is more likely to have problems on the right unless she has an upper arm loop.  Regardless her veins on the right do not appear to be usable for a fistula.  She is scheduled for a left brachiocephalic fistula or AV graft on 01/20/2019.  I have discussed the indications for the procedure and the potential complications and she is agreeable to proceed.  [name], MD Office: [phone]   HPI:   Desiree [name] is a pleasant 43 y.o. female, who has had a left radiocephalic fistula for approximately 5 years.  This has been worked on up multiple times and ultimately occluded and could no longer be salvaged.  She had a tunneled dialysis catheter placed in July.  This has been working fine.  She presents for evaluation for new access.  Of note she does her own dialysis and cannulate her fistula herself.  She denies any recent uremic symptoms.  Specifically, she denies nausea, vomiting, fatigue, anorexia, or palpitations.  Past Medical History:  Diagnosis Date  . Anemia   . Anxiety   . Arthritis    bilateral knees  . Childhood asthma   . Complication of anesthesia    "sometimes it does not work; didn't during [place]" (01/21/2016)  . ESRD (end stage renal disease) on dialysis ([place])    "TTS; [facility]; [facility]" (08/03/2016)  . GERD (gastroesophageal reflux disease)    nexium prn  . Gout   . History of blood  transfusion    "related to kidneys; I've had 4" (01/21/2016)  . Hypertension   . Migraine    "qod now" (01/21/2016)  . Preterm labor ~ 2014  . Renal insufficiency   . Retina hole, left   . Seizures ([place])    "last one was in 2000; related to preeclampsia" (01/21/2016)    Family History  Problem Relation Age of Onset  . Diabetes Mother   . Hyperlipidemia Mother   . Hypertension Mother   . Heart disease Mother   . Hypertension Other   . Diabetes type II Other     SOCIAL HISTORY: Social History   Socioeconomic History  . Marital status: Married    Spouse name: Not on file  . Number of children: Not on file  . Years of education: Not on file  . Highest education level: Not on file  Occupational History  . Not on file  Social Needs  . Financial resource strain: Not on file  . Food insecurity    Worry: Not on file    Inability: Not on file  . Transportation needs    Medical: Not on file    Non-medical: Not on file  Tobacco Use  . Smoking status: Passive Smoke Exposure - Never Smoker  . Smokeless tobacco: Never Used  Substance and Sexual Activity  . Alcohol use: No  . Drug use: Yes  Types: Marijuana    Comment: 05/25/2018 "maybe 2 months"  . Sexual activity: Yes    Birth control/protection: None  Lifestyle  . Physical activity    Days per week: Not on file    Minutes per session: Not on file  . Stress: Not on file  Relationships  . Social Herbalist on phone: Not on file    Gets together: Not on file    Attends religious service: Not on file    Active member of club or organization: Not on file    Attends meetings of clubs or organizations: Not on file    Relationship status: Not on file  . Intimate partner violence    Fear of current or ex partner: Not on file    Emotionally abused: Not on file    Physically abused: Not on file    Forced sexual activity: Not on file  Other Topics Concern  . Not on file  Social History Narrative  . Not on file     Allergies  Allergen Reactions  . Morphine Shortness Of Breath and Anaphylaxis  . Prednisone Other (See Comments)    Other reaction(s): Other (See Comments) Muscle spasms Patient says prednisone causes her to cramp all over, muscle spasms uncontrolled  . Geralyn Flash [Fish Allergy] Itching, Swelling, Rash and Other (See Comments)    Face droops also  . Amlodipine     Angioedema (09/05/17 ED visit)  . Iodinated Diagnostic Agents Itching  . Tape Itching    Current Outpatient Medications  Medication Sig Dispense Refill  . albuterol (PROVENTIL HFA;VENTOLIN HFA) 108 (90 Base) MCG/ACT inhaler Inhale 2 puffs into the lungs every 4 (four) hours as needed for wheezing or shortness of breath. 1 Inhaler 2  . alprazolam (XANAX) 2 MG tablet Take 2 mg by mouth 3 (three) times daily as needed.    . calcium acetate (PHOSLO) 667 MG capsule Take 667-1,334 mg by mouth See admin instructions. Take 3 capsules with meals and 1 to 2 with snacks  4  . cinacalcet (SENSIPAR) 30 MG tablet Take 60 mg by mouth every evening.     . diphenhydrAMINE (BENADRYL) 25 MG tablet Take 1 tablet (25 mg total) by mouth every 6 (six) hours. 20 tablet 0  . NARCAN 4 MG/0.1ML LIQD nasal spray kit 1 spray once.    Marland Kitchen NIFEdipine (ADALAT CC) 30 MG 24 hr tablet Take 60 mg by mouth at bedtime.    Marland Kitchen omeprazole (PRILOSEC) 20 MG capsule Take 20 mg by mouth daily.    . Oxycodone HCl 10 MG TABS Take 10 mg by mouth 3 (three) times daily as needed.     No current facility-administered medications for this visit.     REVIEW OF SYSTEMS:  '[X]'$  denotes positive finding, '[ ]'$  denotes negative finding Cardiac  Comments:  Chest pain or chest pressure:    Shortness of breath upon exertion:    Short of breath when lying flat: x   Irregular heart rhythm:        Vascular    Pain in calf, thigh, or hip brought on by ambulation:    Pain in feet at night that wakes you up from your sleep:     Blood clot in your veins:    Leg swelling:          Pulmonary    Oxygen at home:    Productive cough:     Wheezing:         Neurologic  Sudden weakness in arms or legs:     Sudden numbness in arms or legs:     Sudden onset of difficulty speaking or slurred speech:    Temporary loss of vision in one eye:     Problems with dizziness:         Gastrointestinal    Blood in stool:     Vomited blood:         Genitourinary    Burning when urinating:     Blood in urine:        Psychiatric    Major depression:         Hematologic    Bleeding problems:    Problems with blood clotting too easily:        Skin    Rashes or ulcers:        Constitutional    Fever or chills:     PHYSICAL EXAM:   Vitals:   01/11/19 1343  BP: 139/86  Pulse: 72  Resp: 20  Temp: (!) 97.4 F (36.3 C)  SpO2: 99%  Weight: 211 lb 12.8 oz (96.1 kg)  Height: '5\' 5"'$  (1.651 m)    GENERAL: The patient is a well-nourished female, in no acute distress. The vital signs are documented above. CARDIAC: There is a regular rate and rhythm.  VASCULAR: I do not detect carotid bruits. She has a palpable brachial and radial pulse bilaterally. PULMONARY: There is good air exchange bilaterally without wheezing or rales. ABDOMEN: Soft and non-tender with normal pitched bowel sounds.  MUSCULOSKELETAL: There are no major deformities or cyanosis. NEUROLOGIC: No focal weakness or paresthesias are detected. SKIN: There are no ulcers or rashes noted. PSYCHIATRIC: The patient has a normal affect.  DATA:    UPPER EXTREMITY VEIN MAP: I have independently interpreted the patient's upper extremity vein map.  She does not appear to have a usable vein in the right arm for a fistula.  On the left side the upper arm cephalic vein has diameters range from 0.2 to 0.33 cm.  It is dilated at the antecubital level to 0.51.  The basilic vein on the left does not appear to be adequate for a fistula.  UPPER EXTREMITY ARTERIAL DUPLEX: I have independently interpreted the patient's  upper extremity arterial duplex.  On the right side there is a high bifurcation of the brachial artery.  On the left side the artery bifurcates at the antecubital level.  There is a triphasic radial and ulnar signal on the left.

## 2019-01-18 ENCOUNTER — Other Ambulatory Visit (HOSPITAL_COMMUNITY)
Admission: RE | Admit: 2019-01-18 | Discharge: 2019-01-18 | Disposition: A | Discharge status: Home / Self Care | Payer: Medicare Other | Source: Ambulatory Visit | Attending: Vascular Surgery | Admitting: Vascular Surgery

## 2019-01-18 ENCOUNTER — Other Ambulatory Visit: Payer: Self-pay

## 2019-01-18 ENCOUNTER — Encounter (HOSPITAL_COMMUNITY): Payer: Self-pay | Admitting: *Deleted

## 2019-01-18 DIAGNOSIS — Z20828 Contact with and (suspected) exposure to other viral communicable diseases: Secondary | ICD-10-CM | POA: Insufficient documentation

## 2019-01-18 DIAGNOSIS — Z01812 Encounter for preprocedural laboratory examination: Secondary | ICD-10-CM | POA: Diagnosis present

## 2019-01-18 LAB — SARS CORONAVIRUS 2 (TAT 6-24 HRS): SARS Coronavirus 2: NEGATIVE

## 2019-01-18 NOTE — Progress Notes (Signed)
Patient denies shortness of breath, fever, cough and chest pain.  PCP - Dr Troy Sine Cardiologist - denies  Chest x-ray - 06/02/18 EKG - 06/02/18 Stress Test - denies ECHO - 02/25/09 [facility] Cardiac Cath - denies  Anesthesia review: Yes  STOP now taking any Aleve, Naproxen, Ibuprofen, Motrin, Advil, Goody's, BC's, all herbal medications, fish oil, and all vitamins.   Coronavirus Screening Have you experienced the following symptoms:  Cough yes/no: No Fever (>100.72F)  yes/no: No Runny nose yes/no: No Sore throat yes/no: No Difficulty breathing/shortness of breath  yes/no: No  Have you traveled in the last 14 days and where? yes/no: No  Patient verbalized understanding of instructions that were given via phone.

## 2019-01-19 NOTE — Progress Notes (Signed)
Anesthesia Chart Review: Same-day work-up  ESRD on HD with occluded left radiocephalic fistula which is not salvageable.  She has a functioning catheter.  She has a remote stress echo from 2010 in care everywhere that was normal.  Will need DOS labs and eval.   Stress echo 2010 (Care Everywhere): Negative exercise stress echocardiography with no  echocardiographic evidence of inducible ischemia.  EF 55%. Negative stress ECG. Exercise capacity: 7.7 METs Percent of maximal predicted HR achieved:88 % Appropriate HR and BP response.  Desiree [name] [facility] Short Stay Center/Anesthesiology Phone [phone] 01/19/2019 10:26 AM

## 2019-01-19 NOTE — Anesthesia Preprocedure Evaluation (Addendum)
Anesthesia Evaluation  Patient identified by MRN, date of birth, ID band Patient awake    Reviewed: Allergy & Precautions, NPO status , Patient's Chart, lab work & pertinent test results  Airway Mallampati: II  TM Distance: >3 FB Neck ROM: Full    Dental no notable dental hx. (+) Teeth Intact,    Pulmonary asthma ,    Pulmonary exam normal breath sounds clear to auscultation       Cardiovascular hypertension, Pt. on medications Normal cardiovascular exam Rhythm:Regular Rate:Normal     Neuro/Psych  Headaches, Anxiety Depression    GI/Hepatic GERD  ,  Endo/Other  negative endocrine ROS  Renal/GU Renal diseaseK+4.6 Cr 11.40     Musculoskeletal  (+) Arthritis ,   Abdominal (+) + obese,   Peds  Hematology  (+) anemia ,   Anesthesia Other Findings   Reproductive/Obstetrics (+) Pregnancy                            Anesthesia Physical Anesthesia Plan  ASA: III  Anesthesia Plan: General   Post-op Pain Management:    Induction: Intravenous  PONV Risk Score and Plan: Treatment may vary due to age or medical condition  Airway Management Planned: LMA  Additional Equipment:   Intra-op Plan:   Post-operative Plan:   Informed Consent: I have reviewed the patients History and Physical, chart, labs and discussed the procedure including the risks, benefits and alternatives for the proposed anesthesia with the patient or authorized representative who has indicated his/her understanding and acceptance.     Dental advisory given  Plan Discussed with: CRNA  Anesthesia Plan Comments: (ESRD on HD with occluded left radiocephalic fistula which is not salvageable.  She has a functioning catheter.  She has a remote stress echo from 2010 in care everywhere that was normal.  Will need DOS labs and eval.   Stress echo 2010 (Care Everywhere): Negative exercise stress echocardiography with no   echocardiographic evidence of inducible ischemia.  EF 55%. Negative stress ECG. Exercise capacity: 7.7 METs Percent of maximal predicted HR achieved:88 % Appropriate HR and BP response.)       Anesthesia Quick Evaluation

## 2019-01-20 ENCOUNTER — Ambulatory Visit (HOSPITAL_COMMUNITY): Payer: Medicare Other | Admitting: Physician Assistant

## 2019-01-20 ENCOUNTER — Encounter (HOSPITAL_COMMUNITY): Payer: Self-pay

## 2019-01-20 ENCOUNTER — Ambulatory Visit (HOSPITAL_COMMUNITY)
Admission: RE | Admit: 2019-01-20 | Discharge: 2019-01-20 | Disposition: A | Discharge status: Home / Self Care | Payer: Medicare Other | Attending: Vascular Surgery | Admitting: Vascular Surgery

## 2019-01-20 ENCOUNTER — Other Ambulatory Visit: Payer: Self-pay

## 2019-01-20 ENCOUNTER — Encounter (HOSPITAL_COMMUNITY)
Admission: RE | Disposition: A | Discharge status: Home / Self Care | Payer: Self-pay | Source: Home / Self Care | Attending: Vascular Surgery

## 2019-01-20 DIAGNOSIS — X58XXXA Exposure to other specified factors, initial encounter: Secondary | ICD-10-CM | POA: Diagnosis not present

## 2019-01-20 DIAGNOSIS — Z91013 Allergy to seafood: Secondary | ICD-10-CM | POA: Insufficient documentation

## 2019-01-20 DIAGNOSIS — M170 Bilateral primary osteoarthritis of knee: Secondary | ICD-10-CM | POA: Insufficient documentation

## 2019-01-20 DIAGNOSIS — Z79899 Other long term (current) drug therapy: Secondary | ICD-10-CM | POA: Diagnosis not present

## 2019-01-20 DIAGNOSIS — Z992 Dependence on renal dialysis: Secondary | ICD-10-CM | POA: Insufficient documentation

## 2019-01-20 DIAGNOSIS — Z888 Allergy status to other drugs, medicaments and biological substances status: Secondary | ICD-10-CM | POA: Insufficient documentation

## 2019-01-20 DIAGNOSIS — T82898A Other specified complication of vascular prosthetic devices, implants and grafts, initial encounter: Secondary | ICD-10-CM | POA: Insufficient documentation

## 2019-01-20 DIAGNOSIS — I120 Hypertensive chronic kidney disease with stage 5 chronic kidney disease or end stage renal disease: Secondary | ICD-10-CM | POA: Insufficient documentation

## 2019-01-20 DIAGNOSIS — Z91041 Radiographic dye allergy status: Secondary | ICD-10-CM | POA: Insufficient documentation

## 2019-01-20 DIAGNOSIS — F419 Anxiety disorder, unspecified: Secondary | ICD-10-CM | POA: Insufficient documentation

## 2019-01-20 DIAGNOSIS — Z833 Family history of diabetes mellitus: Secondary | ICD-10-CM | POA: Diagnosis not present

## 2019-01-20 DIAGNOSIS — Z885 Allergy status to narcotic agent status: Secondary | ICD-10-CM | POA: Insufficient documentation

## 2019-01-20 DIAGNOSIS — E1122 Type 2 diabetes mellitus with diabetic chronic kidney disease: Secondary | ICD-10-CM | POA: Diagnosis not present

## 2019-01-20 DIAGNOSIS — Z7951 Long term (current) use of inhaled steroids: Secondary | ICD-10-CM | POA: Insufficient documentation

## 2019-01-20 DIAGNOSIS — M109 Gout, unspecified: Secondary | ICD-10-CM | POA: Diagnosis not present

## 2019-01-20 DIAGNOSIS — Z91048 Other nonmedicinal substance allergy status: Secondary | ICD-10-CM | POA: Diagnosis not present

## 2019-01-20 DIAGNOSIS — N186 End stage renal disease: Secondary | ICD-10-CM | POA: Insufficient documentation

## 2019-01-20 DIAGNOSIS — N185 Chronic kidney disease, stage 5: Secondary | ICD-10-CM | POA: Diagnosis not present

## 2019-01-20 DIAGNOSIS — D631 Anemia in chronic kidney disease: Secondary | ICD-10-CM | POA: Diagnosis not present

## 2019-01-20 DIAGNOSIS — Z8249 Family history of ischemic heart disease and other diseases of the circulatory system: Secondary | ICD-10-CM | POA: Insufficient documentation

## 2019-01-20 DIAGNOSIS — K219 Gastro-esophageal reflux disease without esophagitis: Secondary | ICD-10-CM | POA: Diagnosis not present

## 2019-01-20 HISTORY — PX: AV FISTULA PLACEMENT: SHX1204

## 2019-01-20 HISTORY — DX: Depression, unspecified: F32.A

## 2019-01-20 HISTORY — PX: FISTULA SUPERFICIALIZATION: SHX6341

## 2019-01-20 LAB — POCT I-STAT, CHEM 8
BUN: 31 mg/dL — ABNORMAL HIGH (ref 6–20)
Calcium, Ion: 1.04 mmol/L — ABNORMAL LOW (ref 1.15–1.40)
Chloride: 98 mmol/L (ref 98–111)
Creatinine, Ser: 11.4 mg/dL — ABNORMAL HIGH (ref 0.44–1.00)
Glucose, Bld: 87 mg/dL (ref 70–99)
HCT: 42 % (ref 36.0–46.0)
Hemoglobin: 14.3 g/dL (ref 12.0–15.0)
Potassium: 4.6 mmol/L (ref 3.5–5.1)
Sodium: 136 mmol/L (ref 135–145)
TCO2: 28 mmol/L (ref 22–32)

## 2019-01-20 LAB — HCG, SERUM, QUALITATIVE: Preg, Serum: NEGATIVE

## 2019-01-20 SURGERY — ARTERIOVENOUS (AV) FISTULA CREATION
Anesthesia: General | Site: Arm Upper | Laterality: Left

## 2019-01-20 MED ORDER — CEFAZOLIN SODIUM-DEXTROSE 2-4 GM/100ML-% IV SOLN
2.0000 g | INTRAVENOUS | Status: AC
  Administered 2019-01-20: 2 g via INTRAVENOUS

## 2019-01-20 MED ORDER — MIDAZOLAM HCL 2 MG/2ML IJ SOLN
INTRAMUSCULAR | Status: AC
  Filled 2019-01-20: qty 2

## 2019-01-20 MED ORDER — SODIUM CHLORIDE 0.9 % IV SOLN
INTRAVENOUS | Status: AC
  Filled 2019-01-20: qty 1.2

## 2019-01-20 MED ORDER — FENTANYL CITRATE (PF) 100 MCG/2ML IJ SOLN: 25.0000 ug | INTRAMUSCULAR | Status: DC | PRN

## 2019-01-20 MED ORDER — DEXAMETHASONE SODIUM PHOSPHATE 4 MG/ML IJ SOLN
INTRAMUSCULAR | Status: DC | PRN
  Administered 2019-01-20: 4 mg via INTRAVENOUS

## 2019-01-20 MED ORDER — ONDANSETRON HCL 4 MG/2ML IJ SOLN
INTRAMUSCULAR | Status: AC
  Filled 2019-01-20: qty 2

## 2019-01-20 MED ORDER — PROTAMINE SULFATE 10 MG/ML IV SOLN
INTRAVENOUS | Status: AC
  Filled 2019-01-20: qty 25

## 2019-01-20 MED ORDER — DEXAMETHASONE SODIUM PHOSPHATE 10 MG/ML IJ SOLN
INTRAMUSCULAR | Status: AC
  Filled 2019-01-20: qty 1

## 2019-01-20 MED ORDER — CEFAZOLIN SODIUM-DEXTROSE 2-4 GM/100ML-% IV SOLN
INTRAVENOUS | Status: AC
  Filled 2019-01-20: qty 100

## 2019-01-20 MED ORDER — ACETAMINOPHEN 10 MG/ML IV SOLN: 1000.0000 mg | Freq: Once | INTRAVENOUS | Status: DC | PRN

## 2019-01-20 MED ORDER — ONDANSETRON HCL 4 MG/2ML IJ SOLN: 4.0000 mg | Freq: Once | INTRAMUSCULAR | Status: DC | PRN

## 2019-01-20 MED ORDER — LIDOCAINE-EPINEPHRINE (PF) 1 %-1:200000 IJ SOLN
INTRAMUSCULAR | Status: DC | PRN
  Administered 2019-01-20: 30 mL

## 2019-01-20 MED ORDER — OXYCODONE HCL 5 MG/5ML PO SOLN: 5.0000 mg | Freq: Once | ORAL | Status: DC | PRN

## 2019-01-20 MED ORDER — LIDOCAINE-EPINEPHRINE 1 %-1:100000 IJ SOLN
INTRAMUSCULAR | Status: DC | PRN
  Administered 2019-01-20: 7 mL

## 2019-01-20 MED ORDER — STERILE WATER FOR IRRIGATION IR SOLN
Status: DC | PRN
  Administered 2019-01-20: 1000 mL

## 2019-01-20 MED ORDER — OXYCODONE HCL 5 MG PO TABS: 5.0000 mg | ORAL_TABLET | ORAL | 0 refills | Status: DC | PRN

## 2019-01-20 MED ORDER — 0.9 % SODIUM CHLORIDE (POUR BTL) OPTIME
TOPICAL | Status: DC | PRN
  Administered 2019-01-20: 1000 mL

## 2019-01-20 MED ORDER — PROTAMINE SULFATE 10 MG/ML IV SOLN
INTRAVENOUS | Status: DC | PRN
  Administered 2019-01-20: 40 mg via INTRAVENOUS

## 2019-01-20 MED ORDER — HEPARIN SODIUM (PORCINE) 1000 UNIT/ML IJ SOLN
INTRAMUSCULAR | Status: AC
  Filled 2019-01-20: qty 1

## 2019-01-20 MED ORDER — OXYCODONE HCL 5 MG PO TABS: 5.0000 mg | ORAL_TABLET | Freq: Once | ORAL | Status: DC | PRN

## 2019-01-20 MED ORDER — LIDOCAINE HCL (PF) 1 % IJ SOLN
INTRAMUSCULAR | Status: AC
  Filled 2019-01-20: qty 30

## 2019-01-20 MED ORDER — PHENYLEPHRINE HCL-NACL 10-0.9 MG/250ML-% IV SOLN
INTRAVENOUS | Status: DC | PRN
  Administered 2019-01-20: 30 ug/min via INTRAVENOUS

## 2019-01-20 MED ORDER — SODIUM CHLORIDE 0.9 % IV SOLN
INTRAVENOUS | Status: DC | PRN
  Administered 2019-01-20: 14:00:00 50 mL

## 2019-01-20 MED ORDER — LIDOCAINE-EPINEPHRINE 1 %-1:100000 IJ SOLN
INTRAMUSCULAR | Status: AC
  Filled 2019-01-20: qty 1

## 2019-01-20 MED ORDER — PROPOFOL 10 MG/ML IV BOLUS
INTRAVENOUS | Status: DC | PRN
  Administered 2019-01-20: 70 mg via INTRAVENOUS
  Administered 2019-01-20: 100 mg via INTRAVENOUS

## 2019-01-20 MED ORDER — LIDOCAINE 2% (20 MG/ML) 5 ML SYRINGE
INTRAMUSCULAR | Status: AC
  Filled 2019-01-20: qty 5

## 2019-01-20 MED ORDER — FENTANYL CITRATE (PF) 250 MCG/5ML IJ SOLN
INTRAMUSCULAR | Status: AC
  Filled 2019-01-20: qty 5

## 2019-01-20 MED ORDER — HEPARIN SODIUM (PORCINE) 1000 UNIT/ML IJ SOLN
INTRAMUSCULAR | Status: DC | PRN
  Administered 2019-01-20: 8000 [IU] via INTRAVENOUS

## 2019-01-20 MED ORDER — FENTANYL CITRATE (PF) 100 MCG/2ML IJ SOLN
INTRAMUSCULAR | Status: DC | PRN
  Administered 2019-01-20: 50 ug via INTRAVENOUS
  Administered 2019-01-20: 25 ug via INTRAVENOUS
  Administered 2019-01-20 (×4): 50 ug via INTRAVENOUS

## 2019-01-20 MED ORDER — ONDANSETRON HCL 4 MG/2ML IJ SOLN
INTRAMUSCULAR | Status: DC | PRN
  Administered 2019-01-20: 4 mg via INTRAVENOUS

## 2019-01-20 MED ORDER — SODIUM CHLORIDE 0.9 % IV SOLN
INTRAVENOUS | Status: DC
  Administered 2019-01-20: 13:00:00 via INTRAVENOUS

## 2019-01-20 MED ORDER — PROPOFOL 10 MG/ML IV BOLUS
INTRAVENOUS | Status: AC
  Filled 2019-01-20: qty 20

## 2019-01-20 MED ORDER — LIDOCAINE 2% (20 MG/ML) 5 ML SYRINGE
INTRAMUSCULAR | Status: DC | PRN
  Administered 2019-01-20: 100 mg via INTRAVENOUS

## 2019-01-20 SURGICAL SUPPLY — 31 items
ARMBAND PINK RESTRICT EXTREMIT (MISCELLANEOUS) ×6 IMPLANT
CANISTER SUCT 3000ML PPV (MISCELLANEOUS) ×3 IMPLANT
CANNULA VESSEL 3MM 2 BLNT TIP (CANNULA) ×3 IMPLANT
CLIP VESOCCLUDE MED 6/CT (CLIP) ×3 IMPLANT
CLIP VESOCCLUDE SM WIDE 6/CT (CLIP) ×3 IMPLANT
COVER PROBE W GEL 5X96 (DRAPES) ×3 IMPLANT
COVER WAND RF STERILE (DRAPES) ×3 IMPLANT
DECANTER SPIKE VIAL GLASS SM (MISCELLANEOUS) ×3 IMPLANT
DERMABOND ADVANCED (GAUZE/BANDAGES/DRESSINGS) ×2
DERMABOND ADVANCED .7 DNX12 (GAUZE/BANDAGES/DRESSINGS) ×1 IMPLANT
ELECT REM PT RETURN 9FT ADLT (ELECTROSURGICAL) ×3
ELECTRODE REM PT RTRN 9FT ADLT (ELECTROSURGICAL) ×1 IMPLANT
GLOVE BIO SURGEON STRL SZ7.5 (GLOVE) ×3 IMPLANT
GLOVE BIOGEL PI IND STRL 8 (GLOVE) ×1 IMPLANT
GLOVE BIOGEL PI INDICATOR 8 (GLOVE) ×2
GOWN STRL REUS W/ TWL LRG LVL3 (GOWN DISPOSABLE) ×3 IMPLANT
GOWN STRL REUS W/TWL LRG LVL3 (GOWN DISPOSABLE) ×6
KIT BASIN OR (CUSTOM PROCEDURE TRAY) ×3 IMPLANT
KIT TURNOVER KIT B (KITS) ×3 IMPLANT
NS IRRIG 1000ML POUR BTL (IV SOLUTION) ×3 IMPLANT
PACK CV ACCESS (CUSTOM PROCEDURE TRAY) ×3 IMPLANT
PAD ARMBOARD 7.5X6 YLW CONV (MISCELLANEOUS) ×6 IMPLANT
SPONGE SURGIFOAM ABS GEL 100 (HEMOSTASIS) IMPLANT
SUT PROLENE 6 0 BV (SUTURE) ×3 IMPLANT
SUT VIC AB 3-0 SH 27 (SUTURE) ×6
SUT VIC AB 3-0 SH 27X BRD (SUTURE) ×3 IMPLANT
SUT VIC AB 4-0 PS2 18 (SUTURE) ×3 IMPLANT
SUT VICRYL 4-0 PS2 18IN ABS (SUTURE) ×3 IMPLANT
TOWEL GREEN STERILE (TOWEL DISPOSABLE) ×3 IMPLANT
UNDERPAD 30X30 (UNDERPADS AND DIAPERS) ×3 IMPLANT
WATER STERILE IRR 1000ML POUR (IV SOLUTION) ×3 IMPLANT

## 2019-01-20 NOTE — Op Note (Signed)
    NAME: Desiree [name]    MRN: [id number] DOB: 24-Jul-1975    DATE OF OPERATION: 01/20/2019  PREOP DIAGNOSIS:    End-stage renal disease  POSTOP DIAGNOSIS:    Same  PROCEDURE:    1.  Left brachiocephalic AV fistula 2.  Superficialization of left brachiocephalic AV fistula  SURGEON: Judeth Cornfield. Scot Dock, MD  ASSIST: Levada Dy RNFA  ANESTHESIA: General  EBL: Minimal  INDICATIONS:    Desiree [name] is a 43 y.o. female who presents for new access.  Her left radiocephalic fistula had failed.  FINDINGS:   4.5 mm upper arm cephalic vein however the vein was very deep.  TECHNIQUE:   The patient was taken to the operating room and received a general anesthetic.  The left arm was prepped and draped in usual sterile fashion.  After the skin was infiltrated with 1% lidocaine an incision was made above the antecubital level transversely.  Here the cephalic vein was dissected free and branches were divided between clips and 3-0 silk ties.  The vein was ligated distally.  The brachial artery was dissected free beneath the fascia.  The patient was heparinized.  The vein irrigated up nicely with heparinized saline.  The artery was clamped proximally distally and a longitudinal arteriotomy was made.  The vein was sewn end-to-side to the artery using continuous 6-0 Prolene suture.  At the completion there was an excellent thrill in the fistula and a palpable radial pulse.  The vein was quite deep and therefore elected to superficialize this.  A large amount of adipose tissue was excised using electrocautery anterior to the vein.  The vein was fully mobilized.  I then made a separate incision in the mid upper arm where I also excised a large amount of adipose tissue overlying the vein with electrocautery I circumferentially mobilized the vein.  Branches were divided between clips and 3-0 silk ties.  Hemostasis was obtained in the wounds.  I then closed a deep layer beneath the vein with a running 3-0  Vicryl.  The skin for both incisions was closed with 4-0 Vicryl.  Sterile dressing was applied.  The patient tolerated the procedure well was transferred to the recovery room in stable condition.  All needle and sponge counts were correct.  [name], MD, FACS [facility]  DATE OF DICTATION:   01/20/2019

## 2019-01-20 NOTE — Anesthesia Procedure Notes (Signed)
Procedure Name: LMA Insertion Date/Time: 01/20/2019 12:48 PM Performed by: Amadeo Garnet, CRNA Pre-anesthesia Checklist: Patient identified, Emergency Drugs available, Suction available, Patient being monitored and Timeout performed Patient Re-evaluated:Patient Re-evaluated prior to induction Oxygen Delivery Method: Circle system utilized Preoxygenation: Pre-oxygenation with 100% oxygen Induction Type: IV induction Ventilation: Mask ventilation without difficulty LMA: LMA inserted LMA Size: 4.0 Number of attempts: 1 Placement Confirmation: positive ETCO2 and breath sounds checked- equal and bilateral Tube secured with: Tape Dental Injury: Teeth and Oropharynx as per pre-operative assessment

## 2019-01-20 NOTE — Anesthesia Postprocedure Evaluation (Signed)
Anesthesia Post Note  Patient: Desiree [name]  Procedure(s) Performed: ARTERIOVENOUS (AV) FISTULA CREATION LEFT UPPER ARM (Left Arm Upper) Fistula Superficialization (Left Arm Upper)     Patient location during evaluation: PACU Anesthesia Type: General Level of consciousness: awake and alert Pain management: pain level controlled Vital Signs Assessment: post-procedure vital signs reviewed and stable Respiratory status: spontaneous breathing, nonlabored ventilation, respiratory function stable and patient connected to nasal cannula oxygen Cardiovascular status: blood pressure returned to baseline and stable Postop Assessment: no apparent nausea or vomiting Anesthetic complications: no    Last Vitals:  Vitals:   01/20/19 1437 01/20/19 1452  BP: 133/74 122/86  Pulse: 93 92  Resp: 18 18  Temp: 36.7 C   SpO2: 100% 100%    Last Pain:  Vitals:   01/20/19 1437  TempSrc:   PainSc: 0-No pain                 Barnet Glasgow

## 2019-01-20 NOTE — Interval H&P Note (Signed)
History and Physical Interval Note:  01/20/2019 11:59 AM  Desiree [name]  has presented today for surgery, with the diagnosis of end stage renal disease.  The various methods of treatment have been discussed with the patient and family. After consideration of risks, benefits and other options for treatment, the patient has consented to  Procedure(s): ARTERIOVENOUS (AV) FISTULA CREATION VERSUS GRAFT PLACEMENT (Left) as a surgical intervention.  The patient's history has been reviewed, patient examined, no change in status, stable for surgery.  I have reviewed the patient's chart and labs.  Questions were answered to the patient's satisfaction.     [name]

## 2019-01-20 NOTE — Transfer of Care (Signed)
Immediate Anesthesia Transfer of Care Note  Patient: Desiree [name]  Procedure(s) Performed: ARTERIOVENOUS (AV) FISTULA CREATION LEFT UPPER ARM (Left Arm Upper) Fistula Superficialization (Left Arm Upper)  Patient Location: PACU  Anesthesia Type:General  Level of Consciousness: awake, alert  and oriented  Airway & Oxygen Therapy: Patient Spontanous Breathing and Patient connected to face mask oxygen  Post-op Assessment: Report given to RN, Post -op Vital signs reviewed and stable and Patient moving all extremities  Post vital signs: Reviewed and stable  Last Vitals:  Vitals Value Taken Time  BP 133/74 01/20/19 1437  Temp    Pulse 92 01/20/19 1438  Resp 25 01/20/19 1438  SpO2 100 % 01/20/19 1438  Vitals shown include unvalidated device data.  Last Pain:  Vitals:   01/20/19 1010  TempSrc:   PainSc: 0-No pain      Patients Stated Pain Goal: 3 ([id number])  Complications: No apparent anesthesia complications

## 2019-01-21 ENCOUNTER — Encounter (HOSPITAL_COMMUNITY): Payer: Self-pay | Admitting: Vascular Surgery

## 2019-03-08 ENCOUNTER — Other Ambulatory Visit: Payer: Self-pay

## 2019-03-08 DIAGNOSIS — N186 End stage renal disease: Secondary | ICD-10-CM

## 2019-03-15 ENCOUNTER — Ambulatory Visit (HOSPITAL_COMMUNITY)
Admission: RE | Admit: 2019-03-15 | Discharge: 2019-03-15 | Disposition: A | Discharge status: Home / Self Care | Payer: Medicare Other | Source: Ambulatory Visit | Attending: Family | Admitting: Family

## 2019-03-15 ENCOUNTER — Ambulatory Visit (INDEPENDENT_AMBULATORY_CARE_PROVIDER_SITE_OTHER): Payer: Self-pay | Admitting: Physician Assistant

## 2019-03-15 ENCOUNTER — Other Ambulatory Visit: Payer: Self-pay

## 2019-03-15 VITALS — BP 146/86 | HR 79 | Temp 98.2°F | Resp 20 | Ht 65.0 in | Wt 215.0 lb

## 2019-03-15 DIAGNOSIS — N186 End stage renal disease: Secondary | ICD-10-CM

## 2019-03-15 NOTE — Progress Notes (Signed)
POST OPERATIVE OFFICE NOTE    CC:  F/u for surgery  HPI:  This is a 43 y.o. female who is  8-1/2 weeks status post left brachial cephalic arteriovenous fistula and superficialtlization by Dr. Scot Dock.  She had had a previous left forearm radial cephalic fistula created.  She dialyzes via right IJ tunneled dialysis catheter on T, Th, S  at [facility]  Allergies  Allergen Reactions  . Morphine Shortness Of Breath and Anaphylaxis  . Prednisone Other (See Comments)    Other reaction(s): Other (See Comments) Muscle spasms Patient says prednisone causes her to cramp all over, muscle spasms uncontrolled  . Geralyn Flash [Fish Allergy] Itching, Swelling, Rash and Other (See Comments)    Face droops also  . Amlodipine     Angioedema (09/05/17 ED visit)  . Iodinated Diagnostic Agents Itching  . Tape Itching    Adhesive tape   paper tape ok    Current Outpatient Medications  Medication Sig Dispense Refill  . albuterol (PROVENTIL HFA;VENTOLIN HFA) 108 (90 Base) MCG/ACT inhaler Inhale 2 puffs into the lungs every 4 (four) hours as needed for wheezing or shortness of breath. 1 Inhaler 2  . alprazolam (XANAX) 2 MG tablet Take 2 mg by mouth 3 (three) times daily as needed.    Marland Kitchen aspirin EC 81 MG tablet Take 81 mg by mouth daily.    . calcium acetate (PHOSLO) 667 MG capsule Take 667-1,334 mg by mouth See admin instructions. Take 3 capsules with meals and 1 to 2 with snacks  4  . cinacalcet (SENSIPAR) 30 MG tablet Take 60 mg by mouth every evening.     . cyclobenzaprine (FLEXERIL) 10 MG tablet Take 10 mg by mouth 2 (two) times daily as needed for muscle spasms.    . diphenhydrAMINE (BENADRYL) 25 MG tablet Take 1 tablet (25 mg total) by mouth every 6 (six) hours. 20 tablet 0  . NARCAN 4 MG/0.1ML LIQD nasal spray kit 1 spray once.    Marland Kitchen NIFEdipine (ADALAT CC) 30 MG 24 hr tablet Take 60 mg by mouth at bedtime.    Marland Kitchen omeprazole (PRILOSEC) 20 MG capsule Take 20 mg by mouth daily.    . ondansetron  (ZOFRAN-ODT) 4 MG disintegrating tablet Take 4 mg by mouth every 8 (eight) hours as needed for nausea or vomiting.    Marland Kitchen oxyCODONE (ROXICODONE) 5 MG immediate release tablet Take 1 tablet (5 mg total) by mouth every 4 (four) hours as needed. 15 tablet 0  . Oxycodone HCl 10 MG TABS Take 10 mg by mouth 3 (three) times daily as needed.     No current facility-administered medications for this visit.     ROS:  See HPI  DIALYSIS ACCESS  Reason for Exam: Routine follow up.  Access Site: Left Upper Extremity.  Access Type: Brachial-cephalic [id number]. Failed left radiocephalic AVF.  Performing Technologist: Ronal Fear RVS, RCS    Examination Guidelines: A complete evaluation includes B-mode imaging, spectral Doppler, color Doppler, and power Doppler as needed of all accessible portions of each vessel. Unilateral testing is considered an integral part of a complete examination. Limited examinations for reoccurring indications may be performed as noted.    Findings: +--------------------+----------+-----------------+--------+ AVF                 PSV (cm/s)Flow Vol (mL/min)Comments +--------------------+----------+-----------------+--------+ Native artery inflow   365          2899                +--------------------+----------+-----------------+--------+  AVF Anastomosis        650                              +--------------------+----------+-----------------+--------+    +------------+----------+-------------+----------+--------+ OUTFLOW VEINPSV (cm/s)Diameter (cm)Depth (cm)Describe +------------+----------+-------------+----------+--------+ Shoulder       102        1.24        1.29            +------------+----------+-------------+----------+--------+ Prox UA        142        0.90        2.08            +------------+----------+-------------+----------+--------+ Mid UA         172        1.14        0.82             +------------+----------+-------------+----------+--------+ Dist UA        139        0.94        0.27            +------------+----------+-------------+----------+--------+ AC Fossa       242        1.00        0.29            +------------+----------+-------------+----------+--------+       Summary: Patent arteriovenous fistula. Cephalic outflow vein is tortuous throughout its course.  *See table(s) above for measurements and observations.   Diagnosing physician: Ruta Hinds MD Electronically signed by Ruta Hinds MD on 03/15/2019 at 12:29:56 PM.                     --------------------------------------------------------------------------------     Final    Physical Exam:  Vitals:   03/15/19 1247  Weight: 215 lb (97.5 kg)  Height: _0  (1.651 m)    Incision: Her left upper extremity incisions are well-healed.  She has a palpable thrill and audible bruit in her fistula.   Extremities:  She has normal motor function of her left hand.  She has a 2+ palpable radial pulse Neuro: She is alert and oriented x4 without focal deficits   Assessment/Plan:  This is a 43 y.o. female who is s/p: Left brachiocephalic AV fistula creation.  Her fistula is nicely developed along its course and easily palpable.  Skin incisions are well-healed.  Duplex study shows well-developed vein. Her fistula is mature enough to begin access with small needles.  Once access is established and her treatments are going well via AV fistula, her right IJ TDC can be removed as per nephrology.  -We will see her on an as-needed basis   Jannet Mantis, PA-C [facility] [phone]  Clinic MD:  Oneida Alar

## 2019-06-12 ENCOUNTER — Encounter (HOSPITAL_COMMUNITY): Payer: Self-pay | Admitting: *Deleted

## 2019-06-12 ENCOUNTER — Other Ambulatory Visit: Payer: Self-pay

## 2019-06-12 ENCOUNTER — Emergency Department (HOSPITAL_COMMUNITY)
Admission: EM | Admit: 2019-06-12 | Discharge: 2019-06-12 | Disposition: A | Discharge status: Home / Self Care | Payer: Medicare Other | Attending: Emergency Medicine | Admitting: Emergency Medicine

## 2019-06-12 ENCOUNTER — Emergency Department (HOSPITAL_COMMUNITY): Payer: Medicare Other

## 2019-06-12 DIAGNOSIS — N186 End stage renal disease: Secondary | ICD-10-CM | POA: Insufficient documentation

## 2019-06-12 DIAGNOSIS — Z7722 Contact with and (suspected) exposure to environmental tobacco smoke (acute) (chronic): Secondary | ICD-10-CM | POA: Diagnosis not present

## 2019-06-12 DIAGNOSIS — Z992 Dependence on renal dialysis: Secondary | ICD-10-CM | POA: Insufficient documentation

## 2019-06-12 DIAGNOSIS — Z79899 Other long term (current) drug therapy: Secondary | ICD-10-CM | POA: Insufficient documentation

## 2019-06-12 DIAGNOSIS — I120 Hypertensive chronic kidney disease with stage 5 chronic kidney disease or end stage renal disease: Secondary | ICD-10-CM | POA: Insufficient documentation

## 2019-06-12 DIAGNOSIS — R202 Paresthesia of skin: Secondary | ICD-10-CM | POA: Insufficient documentation

## 2019-06-12 DIAGNOSIS — R0602 Shortness of breath: Secondary | ICD-10-CM | POA: Insufficient documentation

## 2019-06-12 DIAGNOSIS — Z7982 Long term (current) use of aspirin: Secondary | ICD-10-CM | POA: Insufficient documentation

## 2019-06-12 LAB — I-STAT BETA HCG BLOOD, ED (MC, WL, AP ONLY): I-stat hCG, quantitative: <5 m[IU]/mL (ref <5)

## 2019-06-12 LAB — CBC
HCT: 32.7 % — ABNORMAL LOW (ref 36.0–46.0)
Hemoglobin: 10.2 g/dL — ABNORMAL LOW (ref 12.0–15.0)
MCH: 28.1 pg (ref 26.0–34.0)
MCHC: 31.2 g/dL (ref 30.0–36.0)
MCV: 90.1 fL (ref 80.0–100.0)
Platelets: 308 10*3/uL (ref 150–400)
RBC: 3.63 MIL/uL — ABNORMAL LOW (ref 3.87–5.11)
RDW: 17.6 % — ABNORMAL HIGH (ref 11.5–15.5)
WBC: 7.5 10*3/uL (ref 4.0–10.5)
nRBC: 0 % (ref 0.0–0.2)

## 2019-06-12 LAB — BASIC METABOLIC PANEL
Anion gap: 21 — ABNORMAL HIGH (ref 5–15)
BUN: 38 mg/dL — ABNORMAL HIGH (ref 6–20)
CO2: 23 mmol/L (ref 22–32)
Calcium: 8.3 mg/dL — ABNORMAL LOW (ref 8.9–10.3)
Chloride: 97 mmol/L — ABNORMAL LOW (ref 98–111)
Creatinine, Ser: 16.34 mg/dL — ABNORMAL HIGH (ref 0.44–1.00)
GFR calc Af Amer: 3 mL/min — ABNORMAL LOW (ref >60)
GFR calc non Af Amer: 2 mL/min — ABNORMAL LOW (ref >60)
Glucose, Bld: 85 mg/dL (ref 70–99)
Potassium: 3.3 mmol/L — ABNORMAL LOW (ref 3.5–5.1)
Sodium: 141 mmol/L (ref 135–145)

## 2019-06-12 LAB — TROPONIN I (HIGH SENSITIVITY)
Troponin I (High Sensitivity): 9 ng/L (ref <18)
Troponin I (High Sensitivity): 8 ng/L (ref <18)

## 2019-06-12 MED ORDER — SODIUM CHLORIDE 0.9% FLUSH: 3.0000 mL | Freq: Once | INTRAVENOUS | Status: DC

## 2019-06-12 NOTE — ED Triage Notes (Signed)
THE PT IS A DIALYSIS PT THAT WAS LAST DIALYZED Saturday FISTULA  LT UPPER ARM.Marland Kitchen MULTIPLE COMPLAINTS  NAUSEA  BOTH FEET ARE PAINFUL FINGERS SWOLLEN SHE FEELS LIKE SHE HAS FLUID ON HER LUNGS

## 2019-06-12 NOTE — Discharge Instructions (Signed)
I would talk with your doctor about possibly getting back on gabapentin or similar to help with neuropathy. Continue your usual dialysis sessions as scheduled. Return here for any new/acute changes.

## 2019-06-12 NOTE — ED Provider Notes (Signed)
[facility] Provider Note   CSN: [id number] Arrival date & time: 06/12/19  0113     History Chief Complaint  Patient presents with  . Shortness of Breath    Desiree [name] is a 44 y.o. female.  The history is provided by the patient and medical records.   44 year old female with history of anemia, anxiety, arthritis, depression, chronic pain, end-stage renal disease on hemodialysis, GERD, hypertension, seizures, presenting to the ED with multiple complaints.  Patient states she feels short of breath, almost like she is suffocating.  She reports dialysis center has recently upped her dry weight due to some weight gain and now that she has lost weight again she feels like they are not taking enough fluid off.  She did miss treatment last Thursday (3/25) due to GI upset, but had full treatment Saturday (3/27).  She denies any noted cough or fever.  She is not had any sick contacts.  Secondly, patient is complaining of tingling in her hands and feet.  States while she was in the lobby she states she developed swelling of her hands and feet.  States she keeps shaking her hands and feet to try and "wake them up".  States she is able to pick things up and walk normally.  She has not had any noted dizziness or confusion.  She was previously taking gabapentin but not currently.  No new injury, trauma, or falls.    Past Medical History:  Diagnosis Date  . Anemia   . Anxiety    panic attacks  . Arthritis    bilateral knees  . Childhood asthma   . Complication of anesthesia    "sometimes it does not work; didn't during [place]" (01/21/2016)  . Depression    no med  . ESRD (end stage renal disease) on dialysis ([place])    "TTS; [facility]; [facility]" (08/03/2016)  . GERD (gastroesophageal reflux disease)    nexium prn  . Gout   . History of blood transfusion    "related to kidneys; I've had 4" (01/21/2016)  . Hypertension   . Migraine    last one  01/18/19  . Preterm labor ~ 2014  . Renal insufficiency   . Retina hole, left   . Seizures ([place])    "last one was in 2000; related to preeclampsia" (01/21/2016)    Patient Active Problem List   Diagnosis Date Noted  . ESRD (end stage renal disease) on dialysis ([place]) 06/02/2018  . Acute respiratory failure ([place]) 06/02/2018  . Fever 12/07/2017  . Abdominal pain 08/03/2016  . Chronic pain 01/21/2016  . Muscle cramps 03/29/2015  . Anemia of chronic kidney failure 09/15/2013  . Cocaine abuse ([place]) 08/26/2013  . Depression 04/21/2012  . ESRD (end stage renal disease) ([place]) 08/03/2011    Past Surgical History:  Procedure Laterality Date  . AV FISTULA PLACEMENT Left 2010  . AV FISTULA PLACEMENT Left 01/20/2019   Procedure: ARTERIOVENOUS (AV) FISTULA CREATION LEFT UPPER ARM;  Surgeon: Angelia Mould, MD;  Location: [facility];  Service: Vascular;  Laterality: Left;  . CERVICAL BIOPSY  W/ LOOP ELECTRODE EXCISION  2001  . DILATION AND EVACUATION  08/02/2011   Procedure: DILATATION AND EVACUATION;  Surgeon: Logan Bores, MD;  Location: [facility];  Service: Gynecology;;  . Brigitte Pulse AND EVACUATION N/A 08/31/2013   Procedure: DILATATION AND EVACUATION;  Surgeon: Woodroe Mode, MD;  Location: [facility];  Service: Gynecology;  Laterality: N/A;  . FISTULA  SUPERFICIALIZATION Left 01/20/2019   Procedure: Fistula Superficialization;  Surgeon: Angelia Mould, MD;  Location: [facility];  Service: Vascular;  Laterality: Left;  . HYDRADENITIS EXCISION Right   . RENAL BIOPSY    . REVISION OF ARTERIOVENOUS GORETEX GRAFT Left 02/11/2013   Procedure: REVISION OF ARTERIOVENOUS GORTEX FISTULA;  Surgeon: Rosetta Posner, MD;  Location: [facility];  Service: Vascular;  Laterality: Left;  . THROMBECTOMY W/ EMBOLECTOMY Left 05/25/2018   Procedure: REPAIR OF BLEEDING ARTERIOVENOUS FISTULA;  Surgeon: Serafina Mitchell, MD;  Location: [facility];  Service: Vascular;  Laterality: Left;     OB History    Gravida  6   Para    1   Term  1   Preterm  0   AB  4   Living  1     SAB  2   TAB  2   Ectopic  0   Multiple  0   Live Births  1           Family History  Problem Relation Age of Onset  . Diabetes Mother   . Hyperlipidemia Mother   . Hypertension Mother   . Heart disease Mother   . Hypertension Other   . Diabetes type II Other     Social History   Tobacco Use  . Smoking status: Passive Smoke Exposure - Never Smoker  . Smokeless tobacco: Never Used  . Tobacco comment: Husband Smokes  Substance Use Topics  . Alcohol use: No  . Drug use: Yes    Types: Marijuana    Comment: Last use Tues 01/17/19    Home Medications Prior to Admission medications   Medication Sig Start Date End Date Taking? Authorizing Provider  albuterol (PROVENTIL HFA;VENTOLIN HFA) 108 (90 Base) MCG/ACT inhaler Inhale 2 puffs into the lungs every 4 (four) hours as needed for wheezing or shortness of breath. 05/02/16  Yes Forde Dandy, MD  alprazolam Duanne Moron) 2 MG tablet Take 2 mg by mouth 3 (three) times daily as needed. 12/30/18  Yes [provider]  aspirin EC 81 MG tablet Take 81 mg by mouth daily.   Yes [provider]  calcium acetate (PHOSLO) 667 MG capsule Take 667-1,334 mg by mouth See admin instructions. Take 3 capsules with meals and 1 to 2 with snacks 11/30/17  Yes [provider]  cinacalcet (SENSIPAR) 90 MG tablet Take 90 mg by mouth every evening.    Yes [provider]  cyclobenzaprine (FLEXERIL) 10 MG tablet Take 10 mg by mouth 2 (two) times daily as needed for muscle spasms.   Yes [provider]  diphenhydrAMINE (BENADRYL) 25 MG tablet Take 1 tablet (25 mg total) by mouth every 6 (six) hours. Patient taking differently: Take 25 mg by mouth every 6 (six) hours as needed for itching or allergies.  09/05/17  Yes Domenic Moras, PA-C  midodrine (PROAMATINE) 10 MG tablet Take 10 mg by mouth daily as needed (hypotension).  05/25/19  Yes [provider]   NARCAN 4 MG/0.1ML LIQD nasal spray kit 1 spray once. 11/14/18  Yes [provider]  omeprazole (PRILOSEC) 20 MG capsule Take 20 mg by mouth daily as needed (acid reflux).  03/31/18  Yes [provider]  ondansetron (ZOFRAN-ODT) 8 MG disintegrating tablet Take 8 mg by mouth every 8 (eight) hours as needed for nausea or vomiting.    Yes [provider]  Oxycodone HCl 10 MG TABS Take 10 mg by mouth in the morning, at  noon, and at bedtime. 05/17/19  Yes [provider]  sevelamer carbonate (RENVELA) 800 MG tablet Take 1,600 mg by mouth 3 (three) times daily. 05/18/19  Yes [provider]  NIFEdipine (ADALAT CC) 30 MG 24 hr tablet Take 60 mg by mouth at bedtime. 04/23/18   [provider]    Allergies    Morphine, Prednisone, Blain Pais allergy], Amlodipine, Iodinated diagnostic agents, and Tape  Review of Systems   Review of Systems  Respiratory: Positive for shortness of breath.   All other systems reviewed and are negative.   Physical Exam Updated Vital Signs BP 131/78   Pulse 68   Temp 98.5 F (36.9 C) (Oral)   Resp 18   Ht '5\' 5"'$  (1.651 m)   Wt 97.7 kg   SpO2 100%   BMI 35.84 kg/m   Physical Exam Vitals and nursing note reviewed.  Constitutional:      Appearance: She is well-developed.     Comments: Clinically appears well  HENT:     Head: Normocephalic and atraumatic.  Eyes:     Conjunctiva/sclera: Conjunctivae normal.     Pupils: Pupils are equal, round, and reactive to light.  Cardiovascular:     Rate and Rhythm: Normal rate and regular rhythm.     Heart sounds: Normal heart sounds.  Pulmonary:     Effort: Pulmonary effort is normal.     Breath sounds: Normal breath sounds. No wheezing or rhonchi.     Comments: Lungs are clear bilaterally, no acute distress noted, O2 sats maintained at 100% during fluid conversation Abdominal:     General: Bowel sounds are normal.     Palpations: Abdomen is soft.  Musculoskeletal:         General: Normal range of motion.     Cervical back: Normal range of motion.     Comments: No noted peripheral edema  Skin:    General: Skin is warm and dry.  Neurological:     Mental Status: She is alert and oriented to person, place, and time.  Psychiatric:        Mood and Affect: Mood is anxious.     Comments: Very anxious appearing, intermittently tearful during exam     ED Results / Procedures / Treatments   Labs (all labs ordered are listed, but only abnormal results are displayed) Labs Reviewed  BASIC METABOLIC PANEL - Abnormal; Notable for the following components:      Result Value   Potassium 3.3 (*)    Chloride 97 (*)    BUN 38 (*)    Creatinine, Ser 16.34 (*)    Calcium 8.3 (*)    GFR calc non Af Amer 2 (*)    GFR calc Af Amer 3 (*)    Anion gap 21 (*)    All other components within normal limits  CBC - Abnormal; Notable for the following components:   RBC 3.63 (*)    Hemoglobin 10.2 (*)    HCT 32.7 (*)    RDW 17.6 (*)    All other components within normal limits  I-STAT BETA HCG BLOOD, ED ([facility], [facility], [facility] ONLY)  TROPONIN I (HIGH SENSITIVITY)  TROPONIN I (HIGH SENSITIVITY)    EKG EKG Interpretation  Date/Time:  Monday June 12 2019 01:18:33 EDT Ventricular Rate:  75 PR Interval:  114 QRS Duration: 82 QT Interval:  440 QTC Calculation: 491 R Axis:   72 Text Interpretation: Normal sinus rhythm Prolonged QT Abnormal ECG No significant change since  last tracing Confirmed by Pryor Curia [phone]) on 06/12/2019 5:04:14 AM   Radiology DG Chest 2 View  Result Date: 06/12/2019 CLINICAL DATA:  Shortness of breath EXAM: CHEST - 2 VIEW COMPARISON:  06/02/2018 FINDINGS: Cardiac shadow is within normal limits. The lungs are well aerated bilaterally. Mild central vascular prominence is noted. No focal infiltrate or effusion is seen. No bony abnormality is noted. IMPRESSION: Mild central vascular prominence without interstitial edema. Electronically Signed   By: Inez Catalina M.D.   On: 06/12/2019 02:01    Procedures Procedures (including critical care time)  Medications Ordered in ED Medications  sodium chloride flush (NS) 0.9 % injection 3 mL (has no administration in time range)    ED Course  I have reviewed the triage vital signs and the nursing notes.  Pertinent labs & imaging results that were available during my care of the patient were reviewed by me and considered in my medical decision making (see chart for details).    MDM Rules/Calculators/A&P  44 year old female presenting to the ED with multiple complaints.  Mostly is complaining of shortness of breath.  Has history of end-stage renal disease on hemodialysis.  She did miss one treatment last week due to GI upset, has had full treatment since that time.  Does report they have recently increased her dry weight and does not feel like they are taking enough fluid off.  Here she is afebrile nontoxic, in no acute distress.  Her vitals are stable on room air.  She does not appear clinically fluid overloaded.  Her labs are all reassuring including troponin x2.  Chest x-ray with some vascular congestion but no overt edema.  She appears comfortable during conversation.  Does not appear to need any emergent dialysis at this time.    Patient also complaining of tingling in her hands and feet.  This has been an ongoing problem.  No recent injury, trauma, or falls.  Was previously taking gabapentin but is no longer on this.  She does not have any swelling or noted deformities on exam.  She is neurologically intact without any focal deficits.  No wrist drop.  Normal motor function and sensation distally.  Clinically, this seems to be neuropathy.  Recommended that she speak with her physician about going back on gabapentin or similar to help with these issues if they continue.  At this time, patient remains hemodynamically stable.  She does not have any indications for emergent dialysis at this time.  I feel  she is stable for discharge home to follow-up with her normal dialysis session tomorrow as scheduled.  She may contact her PCP to discuss medication adjustments if needed.  She may return here for any new or acute changes.  Patient seen and evaluated with attending physician, Dr. Leonides Schanz, who agrees with assessment and plan of care.  Final Clinical Impression(s) / ED Diagnoses Final diagnoses:  Shortness of breath    Rx / DC Orders ED Discharge Orders    None       Larene Pickett, PA-C 06/12/19 0601    Ward, Delice Bison, DO 06/12/19 [phone]

## 2019-06-12 NOTE — ED Notes (Signed)
Patient verbalizes understanding of discharge instructions. Opportunity for questioning and answers were provided. Armband removed by staff, pt discharged from ED. Pt. ambulatory and discharged home.  

## 2019-09-13 ENCOUNTER — Other Ambulatory Visit: Payer: Self-pay | Admitting: Specialist

## 2019-09-13 DIAGNOSIS — Z1231 Encounter for screening mammogram for malignant neoplasm of breast: Secondary | ICD-10-CM

## 2019-09-25 ENCOUNTER — Ambulatory Visit: Payer: Medicare Other

## 2020-03-01 ENCOUNTER — Ambulatory Visit: Payer: Medicare Other

## 2020-12-20 ENCOUNTER — Other Ambulatory Visit: Payer: Self-pay

## 2020-12-20 DIAGNOSIS — N186 End stage renal disease: Secondary | ICD-10-CM

## 2020-12-23 ENCOUNTER — Ambulatory Visit: Payer: Medicare Other | Admitting: Surgery

## 2020-12-23 ENCOUNTER — Other Ambulatory Visit (HOSPITAL_COMMUNITY): Payer: Medicare Other

## 2021-01-10 ENCOUNTER — Other Ambulatory Visit: Payer: Self-pay

## 2021-01-10 ENCOUNTER — Encounter (HOSPITAL_COMMUNITY): Payer: Self-pay | Admitting: Emergency Medicine

## 2021-01-10 ENCOUNTER — Inpatient Hospital Stay (HOSPITAL_COMMUNITY)
Admission: EM | Admit: 2021-01-10 | Discharge: 2021-01-14 | DRG: 871 | Disposition: A | Discharge status: Home / Self Care | Payer: Medicare Other | Attending: Internal Medicine | Admitting: Internal Medicine

## 2021-01-10 DIAGNOSIS — Z885 Allergy status to narcotic agent status: Secondary | ICD-10-CM

## 2021-01-10 DIAGNOSIS — T383X5A Adverse effect of insulin and oral hypoglycemic [antidiabetic] drugs, initial encounter: Secondary | ICD-10-CM | POA: Diagnosis present

## 2021-01-10 DIAGNOSIS — N2581 Secondary hyperparathyroidism of renal origin: Secondary | ICD-10-CM | POA: Diagnosis present

## 2021-01-10 DIAGNOSIS — R112 Nausea with vomiting, unspecified: Secondary | ICD-10-CM

## 2021-01-10 DIAGNOSIS — R0602 Shortness of breath: Secondary | ICD-10-CM

## 2021-01-10 DIAGNOSIS — L02223 Furuncle of chest wall: Secondary | ICD-10-CM | POA: Diagnosis present

## 2021-01-10 DIAGNOSIS — Z6834 Body mass index (BMI) 34.0-34.9, adult: Secondary | ICD-10-CM

## 2021-01-10 DIAGNOSIS — D631 Anemia in chronic kidney disease: Secondary | ICD-10-CM | POA: Diagnosis present

## 2021-01-10 DIAGNOSIS — R17 Unspecified jaundice: Secondary | ICD-10-CM | POA: Diagnosis present

## 2021-01-10 DIAGNOSIS — M170 Bilateral primary osteoarthritis of knee: Secondary | ICD-10-CM | POA: Diagnosis present

## 2021-01-10 DIAGNOSIS — Z8349 Family history of other endocrine, nutritional and metabolic diseases: Secondary | ICD-10-CM

## 2021-01-10 DIAGNOSIS — Z79891 Long term (current) use of opiate analgesic: Secondary | ICD-10-CM

## 2021-01-10 DIAGNOSIS — Z888 Allergy status to other drugs, medicaments and biological substances status: Secondary | ICD-10-CM

## 2021-01-10 DIAGNOSIS — J45909 Unspecified asthma, uncomplicated: Secondary | ICD-10-CM | POA: Diagnosis present

## 2021-01-10 DIAGNOSIS — G894 Chronic pain syndrome: Secondary | ICD-10-CM | POA: Diagnosis present

## 2021-01-10 DIAGNOSIS — M109 Gout, unspecified: Secondary | ICD-10-CM | POA: Diagnosis present

## 2021-01-10 DIAGNOSIS — Z8249 Family history of ischemic heart disease and other diseases of the circulatory system: Secondary | ICD-10-CM

## 2021-01-10 DIAGNOSIS — Z833 Family history of diabetes mellitus: Secondary | ICD-10-CM

## 2021-01-10 DIAGNOSIS — E875 Hyperkalemia: Secondary | ICD-10-CM | POA: Diagnosis present

## 2021-01-10 DIAGNOSIS — E871 Hypo-osmolality and hyponatremia: Secondary | ICD-10-CM | POA: Diagnosis present

## 2021-01-10 DIAGNOSIS — A4189 Other specified sepsis: Principal | ICD-10-CM | POA: Diagnosis present

## 2021-01-10 DIAGNOSIS — Z887 Allergy status to serum and vaccine status: Secondary | ICD-10-CM

## 2021-01-10 DIAGNOSIS — E160 Drug-induced hypoglycemia without coma: Secondary | ICD-10-CM | POA: Diagnosis present

## 2021-01-10 DIAGNOSIS — Z7982 Long term (current) use of aspirin: Secondary | ICD-10-CM

## 2021-01-10 DIAGNOSIS — Z992 Dependence on renal dialysis: Secondary | ICD-10-CM

## 2021-01-10 DIAGNOSIS — N281 Cyst of kidney, acquired: Secondary | ICD-10-CM | POA: Diagnosis present

## 2021-01-10 DIAGNOSIS — J811 Chronic pulmonary edema: Secondary | ICD-10-CM | POA: Diagnosis present

## 2021-01-10 DIAGNOSIS — G8929 Other chronic pain: Secondary | ICD-10-CM | POA: Diagnosis present

## 2021-01-10 DIAGNOSIS — Z28310 Unvaccinated for covid-19: Secondary | ICD-10-CM

## 2021-01-10 DIAGNOSIS — A0839 Other viral enteritis: Secondary | ICD-10-CM | POA: Diagnosis present

## 2021-01-10 DIAGNOSIS — E669 Obesity, unspecified: Secondary | ICD-10-CM | POA: Diagnosis present

## 2021-01-10 DIAGNOSIS — F419 Anxiety disorder, unspecified: Secondary | ICD-10-CM | POA: Diagnosis present

## 2021-01-10 DIAGNOSIS — I120 Hypertensive chronic kidney disease with stage 5 chronic kidney disease or end stage renal disease: Secondary | ICD-10-CM | POA: Diagnosis present

## 2021-01-10 DIAGNOSIS — R109 Unspecified abdominal pain: Secondary | ICD-10-CM

## 2021-01-10 DIAGNOSIS — N186 End stage renal disease: Secondary | ICD-10-CM | POA: Diagnosis present

## 2021-01-10 DIAGNOSIS — Z20822 Contact with and (suspected) exposure to covid-19: Secondary | ICD-10-CM | POA: Diagnosis present

## 2021-01-10 DIAGNOSIS — Z79899 Other long term (current) drug therapy: Secondary | ICD-10-CM

## 2021-01-10 DIAGNOSIS — E8779 Other fluid overload: Secondary | ICD-10-CM | POA: Diagnosis present

## 2021-01-10 DIAGNOSIS — K219 Gastro-esophageal reflux disease without esophagitis: Secondary | ICD-10-CM | POA: Diagnosis present

## 2021-01-10 DIAGNOSIS — Z9115 Patient's noncompliance with renal dialysis: Secondary | ICD-10-CM

## 2021-01-10 DIAGNOSIS — A419 Sepsis, unspecified organism: Secondary | ICD-10-CM | POA: Diagnosis present

## 2021-01-10 DIAGNOSIS — M79605 Pain in left leg: Secondary | ICD-10-CM | POA: Diagnosis not present

## 2021-01-10 DIAGNOSIS — R509 Fever, unspecified: Secondary | ICD-10-CM

## 2021-01-10 DIAGNOSIS — M898X9 Other specified disorders of bone, unspecified site: Secondary | ICD-10-CM | POA: Diagnosis present

## 2021-01-10 DIAGNOSIS — U071 COVID-19: Secondary | ICD-10-CM | POA: Diagnosis present

## 2021-01-10 LAB — CBC
HCT: 32.9 % — ABNORMAL LOW (ref 36.0–46.0)
Hemoglobin: 10.7 g/dL — ABNORMAL LOW (ref 12.0–15.0)
MCH: 30.5 pg (ref 26.0–34.0)
MCHC: 32.5 g/dL (ref 30.0–36.0)
MCV: 93.7 fL (ref 80.0–100.0)
Platelets: 167 10*3/uL (ref 150–400)
RBC: 3.51 MIL/uL — ABNORMAL LOW (ref 3.87–5.11)
RDW: 19.1 % — ABNORMAL HIGH (ref 11.5–15.5)
WBC: 5.3 10*3/uL (ref 4.0–10.5)
nRBC: 0 % (ref 0.0–0.2)

## 2021-01-10 LAB — PROTIME-INR
INR: 1.2 (ref 0.8–1.2)
Prothrombin Time: 15.1 seconds (ref 11.4–15.2)

## 2021-01-10 MED ORDER — ACETAMINOPHEN 325 MG PO TABS
650.0000 mg | ORAL_TABLET | Freq: Once | ORAL | Status: AC
  Administered 2021-01-10: 650 mg via ORAL
  Filled 2021-01-10: qty 2

## 2021-01-10 NOTE — ED Triage Notes (Signed)
Patient arrived with EMS from home reports pain across her chest with emesis and diarrhea onset yesterday , mild SOB and faint wheezing , no cough or fever , she missed her hemodialysis treatment yesterday .

## 2021-01-11 ENCOUNTER — Emergency Department (HOSPITAL_COMMUNITY): Payer: Medicare Other

## 2021-01-11 DIAGNOSIS — I120 Hypertensive chronic kidney disease with stage 5 chronic kidney disease or end stage renal disease: Secondary | ICD-10-CM | POA: Diagnosis present

## 2021-01-11 DIAGNOSIS — E875 Hyperkalemia: Secondary | ICD-10-CM | POA: Diagnosis present

## 2021-01-11 DIAGNOSIS — J45909 Unspecified asthma, uncomplicated: Secondary | ICD-10-CM | POA: Diagnosis present

## 2021-01-11 DIAGNOSIS — K219 Gastro-esophageal reflux disease without esophagitis: Secondary | ICD-10-CM | POA: Diagnosis present

## 2021-01-11 DIAGNOSIS — Z9115 Patient's noncompliance with renal dialysis: Secondary | ICD-10-CM | POA: Diagnosis not present

## 2021-01-11 DIAGNOSIS — A0839 Other viral enteritis: Secondary | ICD-10-CM | POA: Diagnosis present

## 2021-01-11 DIAGNOSIS — D631 Anemia in chronic kidney disease: Secondary | ICD-10-CM

## 2021-01-11 DIAGNOSIS — N2581 Secondary hyperparathyroidism of renal origin: Secondary | ICD-10-CM | POA: Diagnosis present

## 2021-01-11 DIAGNOSIS — U071 COVID-19: Secondary | ICD-10-CM | POA: Diagnosis present

## 2021-01-11 DIAGNOSIS — T383X5A Adverse effect of insulin and oral hypoglycemic [antidiabetic] drugs, initial encounter: Secondary | ICD-10-CM | POA: Diagnosis present

## 2021-01-11 DIAGNOSIS — G8929 Other chronic pain: Secondary | ICD-10-CM | POA: Diagnosis not present

## 2021-01-11 DIAGNOSIS — E669 Obesity, unspecified: Secondary | ICD-10-CM | POA: Diagnosis present

## 2021-01-11 DIAGNOSIS — Z20822 Contact with and (suspected) exposure to covid-19: Secondary | ICD-10-CM | POA: Diagnosis present

## 2021-01-11 DIAGNOSIS — F419 Anxiety disorder, unspecified: Secondary | ICD-10-CM | POA: Diagnosis present

## 2021-01-11 DIAGNOSIS — N281 Cyst of kidney, acquired: Secondary | ICD-10-CM | POA: Diagnosis present

## 2021-01-11 DIAGNOSIS — N186 End stage renal disease: Secondary | ICD-10-CM

## 2021-01-11 DIAGNOSIS — J810 Acute pulmonary edema: Secondary | ICD-10-CM

## 2021-01-11 DIAGNOSIS — Z6834 Body mass index (BMI) 34.0-34.9, adult: Secondary | ICD-10-CM | POA: Diagnosis not present

## 2021-01-11 DIAGNOSIS — G894 Chronic pain syndrome: Secondary | ICD-10-CM | POA: Diagnosis present

## 2021-01-11 DIAGNOSIS — R7989 Other specified abnormal findings of blood chemistry: Secondary | ICD-10-CM | POA: Diagnosis not present

## 2021-01-11 DIAGNOSIS — E871 Hypo-osmolality and hyponatremia: Secondary | ICD-10-CM | POA: Diagnosis present

## 2021-01-11 DIAGNOSIS — A419 Sepsis, unspecified organism: Secondary | ICD-10-CM | POA: Diagnosis not present

## 2021-01-11 DIAGNOSIS — N185 Chronic kidney disease, stage 5: Secondary | ICD-10-CM

## 2021-01-11 DIAGNOSIS — J811 Chronic pulmonary edema: Secondary | ICD-10-CM | POA: Diagnosis present

## 2021-01-11 DIAGNOSIS — Z992 Dependence on renal dialysis: Secondary | ICD-10-CM

## 2021-01-11 DIAGNOSIS — M79605 Pain in left leg: Secondary | ICD-10-CM | POA: Diagnosis not present

## 2021-01-11 DIAGNOSIS — E160 Drug-induced hypoglycemia without coma: Secondary | ICD-10-CM

## 2021-01-11 DIAGNOSIS — A4189 Other specified sepsis: Secondary | ICD-10-CM | POA: Diagnosis present

## 2021-01-11 DIAGNOSIS — R17 Unspecified jaundice: Secondary | ICD-10-CM | POA: Diagnosis present

## 2021-01-11 DIAGNOSIS — M79604 Pain in right leg: Secondary | ICD-10-CM | POA: Diagnosis not present

## 2021-01-11 DIAGNOSIS — R112 Nausea with vomiting, unspecified: Secondary | ICD-10-CM | POA: Diagnosis present

## 2021-01-11 LAB — BASIC METABOLIC PANEL
Anion gap: 19 — ABNORMAL HIGH (ref 5–15)
Anion gap: 16 — ABNORMAL HIGH (ref 5–15)
BUN: 60 mg/dL — ABNORMAL HIGH (ref 6–20)
BUN: 61 mg/dL — ABNORMAL HIGH (ref 6–20)
CO2: 23 mmol/L (ref 22–32)
CO2: 25 mmol/L (ref 22–32)
Calcium: 9.8 mg/dL (ref 8.9–10.3)
Calcium: 9.7 mg/dL (ref 8.9–10.3)
Chloride: 93 mmol/L — ABNORMAL LOW (ref 98–111)
Chloride: 93 mmol/L — ABNORMAL LOW (ref 98–111)
Creatinine, Ser: 18.54 mg/dL — ABNORMAL HIGH (ref 0.44–1.00)
Creatinine, Ser: 19.17 mg/dL — ABNORMAL HIGH (ref 0.44–1.00)
GFR, Estimated: 2 mL/min — ABNORMAL LOW (ref >60)
GFR, Estimated: 2 mL/min — ABNORMAL LOW (ref >60)
Glucose, Bld: 82 mg/dL (ref 70–99)
Glucose, Bld: 76 mg/dL (ref 70–99)
Potassium: 6.5 mmol/L (ref 3.5–5.1)
Potassium: >7.5 mmol/L (ref 3.5–5.1)
Sodium: 135 mmol/L (ref 135–145)
Sodium: 134 mmol/L — ABNORMAL LOW (ref 135–145)

## 2021-01-11 LAB — I-STAT BETA HCG BLOOD, ED (MC, WL, AP ONLY): I-stat hCG, quantitative: <5 m[IU]/mL (ref <5)

## 2021-01-11 LAB — RESP PANEL BY RT-PCR (FLU A&B, COVID) ARPGX2
Influenza A by PCR: NEGATIVE
Influenza B by PCR: NEGATIVE
SARS Coronavirus 2 by RT PCR: POSITIVE — AB

## 2021-01-11 LAB — CBC
HCT: 34.2 % — ABNORMAL LOW (ref 36.0–46.0)
Hemoglobin: 11.1 g/dL — ABNORMAL LOW (ref 12.0–15.0)
MCH: 30.3 pg (ref 26.0–34.0)
MCHC: 32.5 g/dL (ref 30.0–36.0)
MCV: 93.4 fL (ref 80.0–100.0)
Platelets: 166 10*3/uL (ref 150–400)
RBC: 3.66 MIL/uL — ABNORMAL LOW (ref 3.87–5.11)
RDW: 18.7 % — ABNORMAL HIGH (ref 11.5–15.5)
WBC: 6.3 10*3/uL (ref 4.0–10.5)
nRBC: 0 % (ref 0.0–0.2)

## 2021-01-11 LAB — HEPATIC FUNCTION PANEL
ALT: 8 U/L (ref 0–44)
AST: 10 U/L — ABNORMAL LOW (ref 15–41)
Albumin: 3.5 g/dL (ref 3.5–5.0)
Alkaline Phosphatase: 48 U/L (ref 38–126)
Bilirubin, Direct: 0.1 mg/dL (ref 0.0–0.2)
Indirect Bilirubin: 1.2 mg/dL — ABNORMAL HIGH (ref 0.3–0.9)
Total Bilirubin: 1.3 mg/dL — ABNORMAL HIGH (ref 0.3–1.2)
Total Protein: 7.2 g/dL (ref 6.5–8.1)

## 2021-01-11 LAB — FIBRINOGEN: Fibrinogen: 401 mg/dL (ref 210–475)

## 2021-01-11 LAB — HIV ANTIBODY (ROUTINE TESTING W REFLEX): HIV Screen 4th Generation wRfx: NONREACTIVE

## 2021-01-11 LAB — HEPATITIS B SURFACE ANTIGEN
Hepatitis B Surface Ag: NONREACTIVE
Hepatitis B Surface Ag: NONREACTIVE

## 2021-01-11 LAB — LACTIC ACID, PLASMA: Lactic Acid, Venous: 3 mmol/L (ref 0.5–1.9)

## 2021-01-11 LAB — APTT: aPTT: 30 seconds (ref 24–36)

## 2021-01-11 LAB — CBG MONITORING, ED
Glucose-Capillary: 106 mg/dL — ABNORMAL HIGH (ref 70–99)
Glucose-Capillary: 64 mg/dL — ABNORMAL LOW (ref 70–99)

## 2021-01-11 LAB — POTASSIUM: Potassium: 5.8 mmol/L — ABNORMAL HIGH (ref 3.5–5.1)

## 2021-01-11 LAB — C-REACTIVE PROTEIN: CRP: 13.1 mg/dL — ABNORMAL HIGH (ref <1.0)

## 2021-01-11 LAB — LIPASE, BLOOD: Lipase: 45 U/L (ref 11–51)

## 2021-01-11 LAB — TROPONIN I (HIGH SENSITIVITY)
Troponin I (High Sensitivity): 14 ng/L (ref <18)
Troponin I (High Sensitivity): 17 ng/L (ref <18)

## 2021-01-11 LAB — FERRITIN: Ferritin: 91 ng/mL (ref 11–307)

## 2021-01-11 LAB — D-DIMER, QUANTITATIVE: D-Dimer, Quant: 1.89 ug/mL-FEU — ABNORMAL HIGH (ref 0.00–0.50)

## 2021-01-11 LAB — LACTATE DEHYDROGENASE: LDH: 133 U/L (ref 98–192)

## 2021-01-11 LAB — BRAIN NATRIURETIC PEPTIDE: B Natriuretic Peptide: 1051.8 pg/mL — ABNORMAL HIGH (ref 0.0–100.0)

## 2021-01-11 MED ORDER — SODIUM CHLORIDE 0.9 % IV SOLN: 2.0000 g | Freq: Once | INTRAVENOUS | Status: DC

## 2021-01-11 MED ORDER — DIPHENHYDRAMINE HCL 25 MG PO CAPS: 25.0000 mg | ORAL_CAPSULE | Freq: Four times a day (QID) | ORAL | Status: DC | PRN

## 2021-01-11 MED ORDER — HYDROMORPHONE HCL 1 MG/ML IJ SOLN
0.5000 mg | INTRAMUSCULAR | Status: AC | PRN
  Administered 2021-01-11 – 2021-01-12 (×2): 0.5 mg via INTRAVENOUS
  Filled 2021-01-11: qty 1
  Filled 2021-01-11: qty 0.5

## 2021-01-11 MED ORDER — OXYCODONE HCL 5 MG PO TABS
10.0000 mg | ORAL_TABLET | Freq: Three times a day (TID) | ORAL | Status: DC | PRN
Start: 2021-01-11 — End: 2021-01-14
  Administered 2021-01-11 – 2021-01-14 (×6): 10 mg via ORAL
  Filled 2021-01-11 (×6): qty 2

## 2021-01-11 MED ORDER — CINACALCET HCL 30 MG PO TABS
60.0000 mg | ORAL_TABLET | Freq: Every evening | ORAL | Status: DC
  Administered 2021-01-11 – 2021-01-13 (×3): 60 mg via ORAL
  Filled 2021-01-11 (×4): qty 2

## 2021-01-11 MED ORDER — VANCOMYCIN HCL 2000 MG/400ML IV SOLN
2000.0000 mg | Freq: Once | INTRAVENOUS | Status: AC
  Administered 2021-01-11: 2000 mg via INTRAVENOUS
  Filled 2021-01-11 (×2): qty 400

## 2021-01-11 MED ORDER — DEXTROSE 50 % IV SOLN: 1.0000 | INTRAVENOUS | Status: AC

## 2021-01-11 MED ORDER — SODIUM CHLORIDE 0.9 % IV SOLN
200.0000 mg | Freq: Once | INTRAVENOUS | Status: AC
  Administered 2021-01-11: 200 mg via INTRAVENOUS
  Filled 2021-01-11: qty 40

## 2021-01-11 MED ORDER — SODIUM CHLORIDE 0.9 % IV SOLN: 100.0000 mL | INTRAVENOUS | Status: DC | PRN

## 2021-01-11 MED ORDER — SODIUM CHLORIDE 0.9 % IV SOLN
2.0000 g | Freq: Once | INTRAVENOUS | Status: AC
  Administered 2021-01-11: 2 g via INTRAVENOUS
  Filled 2021-01-11: qty 2

## 2021-01-11 MED ORDER — OXYCODONE HCL 5 MG PO TABS
10.0000 mg | ORAL_TABLET | Freq: Once | ORAL | Status: DC
  Filled 2021-01-11: qty 2

## 2021-01-11 MED ORDER — SODIUM BICARBONATE 8.4 % IV SOLN
50.0000 meq | Freq: Once | INTRAVENOUS | Status: AC
  Administered 2021-01-11: 50 meq via INTRAVENOUS
  Filled 2021-01-11: qty 50

## 2021-01-11 MED ORDER — DEXTROSE 50 % IV SOLN
50.0000 mL | Freq: Once | INTRAVENOUS | Status: AC
  Administered 2021-01-11: 50 mL via INTRAVENOUS
  Filled 2021-01-11: qty 50

## 2021-01-11 MED ORDER — LIDOCAINE HCL (PF) 1 % IJ SOLN: 5.0000 mL | INTRAMUSCULAR | Status: DC | PRN

## 2021-01-11 MED ORDER — INSULIN ASPART 100 UNIT/ML IJ SOLN
10.0000 [IU] | Freq: Once | INTRAMUSCULAR | Status: AC
  Administered 2021-01-11: 10 [IU] via INTRAVENOUS

## 2021-01-11 MED ORDER — HEPARIN SODIUM (PORCINE) 5000 UNIT/ML IJ SOLN
5000.0000 [IU] | Freq: Three times a day (TID) | INTRAMUSCULAR | Status: DC
  Administered 2021-01-11 – 2021-01-14 (×9): 5000 [IU] via SUBCUTANEOUS
  Filled 2021-01-11 (×9): qty 1

## 2021-01-11 MED ORDER — METRONIDAZOLE 500 MG/100ML IV SOLN
500.0000 mg | Freq: Once | INTRAVENOUS | Status: AC
  Administered 2021-01-11: 500 mg via INTRAVENOUS
  Filled 2021-01-11: qty 100

## 2021-01-11 MED ORDER — CALCIUM GLUCONATE 10 % IV SOLN
1.0000 g | Freq: Once | INTRAVENOUS | Status: AC
  Administered 2021-01-11: 1 g via INTRAVENOUS
  Filled 2021-01-11: qty 10

## 2021-01-11 MED ORDER — LOPERAMIDE HCL 2 MG PO CAPS
2.0000 mg | ORAL_CAPSULE | ORAL | Status: DC | PRN
  Administered 2021-01-12: 2 mg via ORAL
  Filled 2021-01-11: qty 1

## 2021-01-11 MED ORDER — VANCOMYCIN HCL 2000 MG/400ML IV SOLN
2000.0000 mg | Freq: Once | INTRAVENOUS | Status: DC
  Filled 2021-01-11: qty 400

## 2021-01-11 MED ORDER — LACTATED RINGERS IV BOLUS (SEPSIS)
500.0000 mL | Freq: Once | INTRAVENOUS | Status: AC
  Administered 2021-01-11: 500 mL via INTRAVENOUS

## 2021-01-11 MED ORDER — FERRIC CITRATE 1 GM 210 MG(FE) PO TABS
420.0000 mg | ORAL_TABLET | Freq: Three times a day (TID) | ORAL | Status: DC
  Filled 2021-01-11 (×2): qty 2

## 2021-01-11 MED ORDER — SODIUM CHLORIDE 0.9 % IV SOLN
100.0000 mg | Freq: Every day | INTRAVENOUS | Status: DC
  Administered 2021-01-12: 100 mg via INTRAVENOUS
  Filled 2021-01-11: qty 20

## 2021-01-11 MED ORDER — DEXTROSE 50 % IV SOLN: 1.0000 | INTRAVENOUS | Status: DC | PRN

## 2021-01-11 MED ORDER — ALBUTEROL SULFATE HFA 108 (90 BASE) MCG/ACT IN AERS
2.0000 | INHALATION_SPRAY | Freq: Four times a day (QID) | RESPIRATORY_TRACT | Status: DC
  Administered 2021-01-11 – 2021-01-12 (×4): 2 via RESPIRATORY_TRACT
  Filled 2021-01-11 (×3): qty 6.7

## 2021-01-11 MED ORDER — ONDANSETRON HCL 4 MG PO TABS: 4.0000 mg | ORAL_TABLET | Freq: Four times a day (QID) | ORAL | Status: DC | PRN

## 2021-01-11 MED ORDER — HEPARIN SODIUM (PORCINE) 1000 UNIT/ML DIALYSIS
20.0000 [IU]/kg | INTRAMUSCULAR | Status: DC | PRN
  Administered 2021-01-11: 1900 [IU] via INTRAVENOUS_CENTRAL
  Filled 2021-01-11 (×3): qty 2

## 2021-01-11 MED ORDER — ACETAMINOPHEN 325 MG PO TABS
650.0000 mg | ORAL_TABLET | Freq: Four times a day (QID) | ORAL | Status: DC | PRN
  Administered 2021-01-11 – 2021-01-13 (×4): 650 mg via ORAL
  Filled 2021-01-11 (×4): qty 2

## 2021-01-11 MED ORDER — SEVELAMER CARBONATE 800 MG PO TABS
1600.0000 mg | ORAL_TABLET | Freq: Three times a day (TID) | ORAL | Status: DC
  Administered 2021-01-11 – 2021-01-13 (×8): 1600 mg via ORAL
  Filled 2021-01-11 (×9): qty 2

## 2021-01-11 MED ORDER — GUAIFENESIN-DM 100-10 MG/5ML PO SYRP: 10.0000 mL | ORAL_SOLUTION | ORAL | Status: DC | PRN

## 2021-01-11 MED ORDER — ONDANSETRON HCL 4 MG/2ML IJ SOLN
4.0000 mg | Freq: Four times a day (QID) | INTRAMUSCULAR | Status: DC | PRN
  Administered 2021-01-12 – 2021-01-13 (×3): 4 mg via INTRAVENOUS
  Filled 2021-01-11 (×3): qty 2

## 2021-01-11 MED ORDER — PANTOPRAZOLE SODIUM 40 MG PO TBEC
40.0000 mg | DELAYED_RELEASE_TABLET | Freq: Every day | ORAL | Status: DC
  Administered 2021-01-11 – 2021-01-14 (×4): 40 mg via ORAL
  Filled 2021-01-11 (×4): qty 1

## 2021-01-11 MED ORDER — CALCIUM ACETATE (PHOS BINDER) 667 MG PO CAPS
2001.0000 mg | ORAL_CAPSULE | Freq: Three times a day (TID) | ORAL | Status: DC
  Administered 2021-01-11 – 2021-01-13 (×7): 2001 mg via ORAL
  Filled 2021-01-11 (×8): qty 3

## 2021-01-11 MED ORDER — VANCOMYCIN VARIABLE DOSE PER UNSTABLE RENAL FUNCTION (PHARMACIST DOSING): Status: DC

## 2021-01-11 MED ORDER — HEPARIN SODIUM (PORCINE) 1000 UNIT/ML DIALYSIS
1000.0000 [IU] | INTRAMUSCULAR | Status: DC | PRN
  Filled 2021-01-11: qty 1

## 2021-01-11 MED ORDER — SODIUM ZIRCONIUM CYCLOSILICATE 10 G PO PACK
10.0000 g | PACK | Freq: Once | ORAL | Status: AC
  Administered 2021-01-11: 10 g via ORAL
  Filled 2021-01-11: qty 1

## 2021-01-11 MED ORDER — DEXTROSE 50 % IV SOLN
INTRAVENOUS | Status: AC
  Administered 2021-01-11: 50 mL via INTRAVENOUS
  Filled 2021-01-11: qty 50

## 2021-01-11 MED ORDER — HYDROMORPHONE HCL 1 MG/ML IJ SOLN
0.5000 mg | Freq: Once | INTRAMUSCULAR | Status: AC
  Administered 2021-01-11: 0.5 mg via INTRAVENOUS
  Filled 2021-01-11: qty 1

## 2021-01-11 MED ORDER — VANCOMYCIN HCL IN DEXTROSE 1-5 GM/200ML-% IV SOLN: 1000.0000 mg | Freq: Once | INTRAVENOUS | Status: DC

## 2021-01-11 MED ORDER — ALTEPLASE 2 MG IJ SOLR: 2.0000 mg | Freq: Once | INTRAMUSCULAR | Status: DC | PRN

## 2021-01-11 MED ORDER — SODIUM CHLORIDE 0.9 % IV SOLN
1.0000 g | INTRAVENOUS | Status: DC
  Filled 2021-01-11: qty 1

## 2021-01-11 MED ORDER — LIDOCAINE-PRILOCAINE 2.5-2.5 % EX CREA
1.0000 "application " | TOPICAL_CREAM | CUTANEOUS | Status: DC | PRN
  Filled 2021-01-11: qty 5

## 2021-01-11 MED ORDER — CHLORHEXIDINE GLUCONATE CLOTH 2 % EX PADS
6.0000 | MEDICATED_PAD | Freq: Every day | CUTANEOUS | Status: DC
  Administered 2021-01-13 – 2021-01-14 (×2): 6 via TOPICAL

## 2021-01-11 MED ORDER — CALCIUM GLUCONATE-NACL 1-0.675 GM/50ML-% IV SOLN
1.0000 g | Freq: Once | INTRAVENOUS | Status: AC
  Administered 2021-01-11: 1000 mg via INTRAVENOUS
  Filled 2021-01-11: qty 50

## 2021-01-11 MED ORDER — CALCIUM ACETATE (PHOS BINDER) 667 MG PO CAPS: 667.0000 mg | ORAL_CAPSULE | ORAL | Status: DC | PRN

## 2021-01-11 MED ORDER — ALPRAZOLAM 0.5 MG PO TABS
2.0000 mg | ORAL_TABLET | Freq: Three times a day (TID) | ORAL | Status: DC | PRN
  Administered 2021-01-12 (×2): 2 mg via ORAL
  Administered 2021-01-13 (×2): 1 mg via ORAL
  Filled 2021-01-11 (×4): qty 4

## 2021-01-11 MED ORDER — PENTAFLUOROPROP-TETRAFLUOROETH EX AERO
1.0000 "application " | INHALATION_SPRAY | CUTANEOUS | Status: DC | PRN
  Filled 2021-01-11: qty 116

## 2021-01-11 NOTE — Progress Notes (Signed)
Called Ed to transport patient to HD unit for urgent HD, however notified that she is covid + and will need to wait when we have staff to perform HD in isolation unit.  Continue with supportive care and treat hyperkalemia medically until we can perform HD.

## 2021-01-11 NOTE — Progress Notes (Signed)
Per secure chat with bedside RN Lanny Hurst, Pt difficult stick causing  delay in Lactic and blood culture draws. Will continue to follow and monitor.

## 2021-01-11 NOTE — ED Provider Notes (Signed)
[facility] EMERGENCY DEPARTMENT Provider Note   CSN: [id number] Arrival date & time: 01/10/21  2307     History Chief Complaint  Patient presents with   Chest Pain    Desiree [name] is a 45 y.o. female.  Patient with history of ESRD on dialysis, asthma, hypertension presenting with a 2-day history of nausea, vomiting, diarrhea and abdominal pain.  States she feels sore and achy all over.  Febrile on arrival to the ED which she is not aware of.  Missed dialysis on Thursday and is due this morning.  States she has been having vomiting and diarrhea for the past 2 days and vomited 2 times today.  Emesis has been nonbloody and nonbilious.  Multiple episodes of diarrhea.  No travel or sick contacts.  Did not know that she had a fever.  Does not make any urine.  Complains of "boil" underneath right breast which is improving over the past several days.  Denies any bleeding or drainage.  States she gets them frequently. She complains of headache, body aches, chills, chest pain, abdominal pain Abdominal pain is diffuse.  Still has appendix and gallbladder.  Denies any cough, runny nose or sore throat.  Complains of headache, pain in his, legs, chest and abdomen diffusely  The history is provided by the patient and a relative. The history is limited by the condition of the patient.  Chest Pain Associated symptoms: abdominal pain, fever, nausea and vomiting   Associated symptoms: no cough, no dizziness, no fatigue, no headache, no shortness of breath and no weakness       Past Medical History:  Diagnosis Date   Anemia    Anxiety    panic attacks   Arthritis    bilateral knees   Childhood asthma    Complication of anesthesia    "sometimes it does not work; didn't during LEEP OR" (01/21/2016)   Depression    no med   ESRD (end stage renal disease) on dialysis ([place])    "TTS; [facility]; [facility]" (08/03/2016)   GERD (gastroesophageal reflux disease)    nexium  prn   Gout    History of blood transfusion    "related to kidneys; I've had 4" (01/21/2016)   Hypertension    Migraine    last one 01/18/19   Preterm labor ~ 2014   Renal insufficiency    Retina hole, left    Seizures ([place])    "last one was in 2000; related to preeclampsia" (01/21/2016)    Patient Active Problem List   Diagnosis Date Noted   ESRD (end stage renal disease) on dialysis ([place]) 06/02/2018   Acute respiratory failure ([place]) 06/02/2018   Fever 12/07/2017   Abdominal pain 08/03/2016   Chronic pain 01/21/2016   Muscle cramps 03/29/2015   Anemia of chronic kidney failure 09/15/2013   Cocaine abuse ([place]) 08/26/2013   Depression 04/21/2012   ESRD (end stage renal disease) ([place]) 08/03/2011    Past Surgical History:  Procedure Laterality Date   AV FISTULA PLACEMENT Left 2010   AV FISTULA PLACEMENT Left 01/20/2019   Procedure: ARTERIOVENOUS (AV) FISTULA CREATION LEFT UPPER ARM;  Surgeon: Angelia Mould, MD;  Location: [place];  Service: Vascular;  Laterality: Left;   CERVICAL BIOPSY  W/ LOOP ELECTRODE EXCISION  2001   DILATION AND EVACUATION  08/02/2011   Procedure: DILATATION AND EVACUATION;  Surgeon: Logan Bores, MD;  Location: [facility] [facility];  Service: Gynecology;;   DILATION AND EVACUATION N/A 08/31/2013  Procedure: DILATATION AND EVACUATION;  Surgeon: Woodroe Mode, MD;  Location: [facility] [place] [facility];  Service: Gynecology;  Laterality: N/A;   FISTULA SUPERFICIALIZATION Left 01/20/2019   Procedure: Fistula Superficialization;  Surgeon: Angelia Mould, MD;  Location: [place];  Service: Vascular;  Laterality: Left;   HYDRADENITIS EXCISION Right    RENAL BIOPSY     REVISION OF ARTERIOVENOUS GORETEX GRAFT Left 02/11/2013   Procedure: REVISION OF ARTERIOVENOUS GORTEX FISTULA;  Surgeon: Rosetta Posner, MD;  Location: [facility];  Service: Vascular;  Laterality: Left;   THROMBECTOMY W/ EMBOLECTOMY Left 05/25/2018   Procedure: REPAIR OF BLEEDING ARTERIOVENOUS FISTULA;  Surgeon: Serafina Mitchell, MD;  Location: [facility];  Service: Vascular;  Laterality: Left;     OB History     Gravida  6   Para  1   Term  1   Preterm  0   AB  4   Living  1      SAB  2   IAB  2   Ectopic  0   Multiple  0   Live Births  1           Family History  Problem Relation Age of Onset   Diabetes Mother    Hyperlipidemia Mother    Hypertension Mother    Heart disease Mother    Hypertension Other    Diabetes type II Other     Social History   Tobacco Use   Smoking status: Passive Smoke Exposure - Never Smoker   Smokeless tobacco: Never   Tobacco comments:    Husband Smokes  Vaping Use   Vaping Use: Never used  Substance Use Topics   Alcohol use: No   Drug use: Yes    Types: Marijuana    Comment: Last use Tues 01/17/19    Home Medications Prior to Admission medications   Medication Sig Start Date End Date Taking? Authorizing Provider  albuterol (PROVENTIL HFA;VENTOLIN HFA) 108 (90 Base) MCG/ACT inhaler Inhale 2 puffs into the lungs every 4 (four) hours as needed for wheezing or shortness of breath. 05/02/16   Forde Dandy, MD  alprazolam Duanne Moron) 2 MG tablet Take 2 mg by mouth 3 (three) times daily as needed. 12/30/18   [provider]  aspirin EC 81 MG tablet Take 81 mg by mouth daily.    [provider]  calcium acetate (PHOSLO) 667 MG capsule Take 667-1,334 mg by mouth See admin instructions. Take 3 capsules with meals and 1 to 2 with snacks 11/30/17   [provider]  cinacalcet (SENSIPAR) 90 MG tablet Take 90 mg by mouth every evening.     [provider]  cyclobenzaprine (FLEXERIL) 10 MG tablet Take 10 mg by mouth 2 (two) times daily as needed for muscle spasms.    [provider]  diphenhydrAMINE (BENADRYL) 25 MG tablet Take 1 tablet (25 mg total) by mouth every 6 (six) hours. Patient taking differently: Take 25 mg by mouth every 6 (six) hours as needed for itching or allergies.  09/05/17   Domenic Moras, PA-C   midodrine (PROAMATINE) 10 MG tablet Take 10 mg by mouth daily as needed (hypotension).  05/25/19   [provider]  NARCAN 4 MG/0.1ML LIQD nasal spray kit 1 spray once. 11/14/18   [provider]  NIFEdipine (ADALAT CC) 30 MG 24 hr tablet Take 60 mg by mouth at bedtime. 04/23/18   [provider]  omeprazole (PRILOSEC) 20 MG capsule Take 20 mg by  mouth daily as needed (acid reflux).  03/31/18   [provider]  ondansetron (ZOFRAN-ODT) 8 MG disintegrating tablet Take 8 mg by mouth every 8 (eight) hours as needed for nausea or vomiting.     [provider]  Oxycodone HCl 10 MG TABS Take 10 mg by mouth in the morning, at noon, and at bedtime. 05/17/19   [provider]  sevelamer carbonate (RENVELA) 800 MG tablet Take 1,600 mg by mouth 3 (three) times daily. 05/18/19   [provider]    Allergies    Morphine, Prednisone, [place] Bing allergy], Amlodipine, Iodinated diagnostic agents, and Tape  Review of Systems   Review of Systems  Constitutional:  Positive for activity change, appetite change, chills and fever. Negative for fatigue.  HENT:  Negative for congestion and rhinorrhea.   Respiratory:  Negative for cough, chest tightness and shortness of breath.   Cardiovascular:  Positive for chest pain.  Gastrointestinal:  Positive for abdominal pain, constipation, diarrhea, nausea and vomiting.  Genitourinary:  Negative for dysuria, hematuria, vaginal bleeding and vaginal discharge.  Musculoskeletal:  Negative for arthralgias and myalgias.  Skin:  Negative for rash.  Neurological:  Negative for dizziness, weakness and headaches.   all other systems are negative except as noted in the HPI and PMH.   Physical Exam Updated Vital Signs BP (!) 159/73   Pulse 65   Temp (!) 100.9 F (38.3 C) (Oral)   Resp 17   Ht 5\' 5"  (1.651 m)   Wt 95 kg   SpO2 100%   BMI 34.85 kg/m   Physical Exam Vitals and nursing note reviewed.  Constitutional:       General: She is not in acute distress.    Appearance: Normal appearance. She is well-developed and normal weight. She is not ill-appearing.  HENT:     Head: Normocephalic and atraumatic.     Mouth/Throat:     Pharynx: No oropharyngeal exudate.  Eyes:     Conjunctiva/sclera: Conjunctivae normal.     Pupils: Pupils are equal, round, and reactive to light.  Neck:     Comments: No meningismus. Cardiovascular:     Rate and Rhythm: Normal rate and regular rhythm.     Heart sounds: Normal heart sounds. No murmur heard. Pulmonary:     Effort: Pulmonary effort is normal. No respiratory distress.     Breath sounds: Normal breath sounds.     Comments: Painful nodule under R breast without fluctuance or erythema. Chaperone present Chest:     Chest wall: No tenderness.  Abdominal:     Palpations: Abdomen is soft.     Tenderness: There is abdominal tenderness. There is no guarding or rebound.     Comments: Diffuse tenderness, without guarding or rebound  Musculoskeletal:        General: No tenderness. Normal range of motion.     Cervical back: Normal range of motion and neck supple.  Skin:    General: Skin is warm.     Capillary Refill: Capillary refill takes less than 2 seconds.  Neurological:     General: No focal deficit present.     Mental Status: She is alert and oriented to person, place, and time. Mental status is at baseline.     Cranial Nerves: No cranial nerve deficit.     Motor: No abnormal muscle tone.     Coordination: Coordination normal.     Comments:  5/5 strength throughout. CN 2-12 intact.Equal grip strength.   Psychiatric:  Behavior: Behavior normal.    ED Results / Procedures / Treatments   Labs (all labs ordered are listed, but only abnormal results are displayed) Labs Reviewed  RESP PANEL BY RT-PCR (FLU A&B, COVID) ARPGX2 - Abnormal; Notable for the following components:      Result Value   SARS Coronavirus 2 by RT PCR POSITIVE (*)    All other  components within normal limits  BASIC METABOLIC PANEL - Abnormal; Notable for the following components:   Potassium 6.5 (*)    Chloride 93 (*)    BUN 60 (*)    Creatinine, Ser 18.54 (*)    GFR, Estimated 2 (*)    Anion gap 19 (*)    All other components within normal limits  CBC - Abnormal; Notable for the following components:   RBC 3.51 (*)    Hemoglobin 10.7 (*)    HCT 32.9 (*)    RDW 19.1 (*)    All other components within normal limits  HEPATIC FUNCTION PANEL - Abnormal; Notable for the following components:   AST 10 (*)    Total Bilirubin 1.3 (*)    Indirect Bilirubin 1.2 (*)    All other components within normal limits  CULTURE, BLOOD (ROUTINE X 2)  CULTURE, BLOOD (ROUTINE X 2)  URINE CULTURE  PROTIME-INR  APTT  LIPASE, BLOOD  LACTIC ACID, PLASMA  LACTIC ACID, PLASMA  URINALYSIS, ROUTINE W REFLEX MICROSCOPIC  BASIC METABOLIC PANEL  I-STAT BETA HCG BLOOD, ED ([facility], [facility], [facility] ONLY)  CBG MONITORING, ED  TROPONIN I (HIGH SENSITIVITY)  TROPONIN I (HIGH SENSITIVITY)    EKG EKG Interpretation  Date/Time:  Friday January 10 2021 23:03:01 EDT Ventricular Rate:  67 PR Interval:  120 QRS Duration: 66 QT Interval:  400 QTC Calculation: 422 R Axis:   65 Text Interpretation: Normal sinus rhythm Normal ECG peaked T waves Confirmed by Ezequiel Essex [phone]) on 01/11/2021 12:51:24 AM  Radiology DG Chest 2 View  Result Date: 01/11/2021 CLINICAL DATA:  Initial evaluation for acute chest pain. EXAM: CHEST - 2 VIEW COMPARISON:  Radiograph from 06/12/2019. FINDINGS: Mild cardiomegaly.  Mediastinal silhouette within normal limits. Lungs mildly hypoinflated. Mild perihilar and interstitial congestion without frank pulmonary edema. No visible pleural effusion. No consolidative airspace disease. No pneumothorax. Visualized osseous structures within normal limits. IMPRESSION: 1. Cardiomegaly with mild perihilar and interstitial congestion without frank pulmonary edema. 2. No other  active cardiopulmonary disease. Electronically Signed   By: Jeannine Boga M.D.   On: 01/11/2021 00:39   CT CHEST ABDOMEN PELVIS WO CONTRAST  Result Date: 01/11/2021 CLINICAL DATA:  Abdominal pain and fever. Emesis and diarrhea. Short of breath. Dialysis patient. Positive COVID EXAM: CT CHEST, ABDOMEN AND PELVIS WITHOUT CONTRAST TECHNIQUE: Multidetector CT imaging of the chest, abdomen and pelvis was performed following the standard protocol without IV contrast. COMPARISON:  CT 05/02/2018 FINDINGS: CT CHEST FINDINGS Cardiovascular: No significant vascular findings. Normal heart size. No pericardial effusion. Atherosclerotic calcification of the aorta. Mediastinum/Nodes: No axillary or supraclavicular adenopathy. No mediastinal or hilar adenopathy. No pericardial fluid. Esophagus normal. Lungs/Pleura: Mild ground-glass opacities in the lungs in a diffuse pattern. For example superior segment of the RIGHT lower lobe (image 56/5). No focal consolidation. No pleural fluid. No pneumothorax. Airways normal Musculoskeletal: No aggressive osseous lesion. CT ABDOMEN AND PELVIS FINDINGS Hepatobiliary: No focal hepatic lesion. No biliary ductal dilatation. Gallbladder is normal. Common bile duct is normal. Pancreas: No pancreatic inflammation. Spleen: Normal spleen Adrenals/urinary tract: Adrenal glands are normal. The ureters  and bladder normal. Large periphery calcified cystic mass emanating from the lower pole of the LEFT kidney has uniform central simple fluid attenuation. No change from prior. Cyst measures up to 9 cm. Kidneys are atrophic. Bladder collapsed Stomach/Bowel: Stomach, small bowel, appendix, and cecum are normal. The colon and rectosigmoid colon are normal. Vascular/Lymphatic: Abdominal aorta is normal caliber with atherosclerotic calcification. There is no retroperitoneal or periportal lymphadenopathy. No pelvic lymphadenopathy. Reproductive: Uterus and adnexa unremarkable. Other: Is  Musculoskeletal: No aggressive osseous lesion. IMPRESSION: Chest Impression: 1. Mild pulmonary edema pattern in the lungs. 2. No focal consolidation Abdomen / Pelvis Impression: 1. No acute findings abdomen pelvis. 2. Large benign appearing cyst of the LEFT kidney. 3. Bilateral atrophic kidneys. 4.  Aortic Atherosclerosis (ICD10-I70.0). Electronically Signed   By: Suzy Bouchard M.D.   On: 01/11/2021 06:05    Procedures .Critical Care Performed by: Ezequiel Essex, MD Authorized by: Ezequiel Essex, MD   Critical care provider statement:    Critical care time (minutes):  45   Critical care time was exclusive of:  Separately billable procedures and treating other patients   Critical care was necessary to treat or prevent imminent or life-threatening deterioration of the following conditions:  Renal failure, sepsis and endocrine crisis   Critical care was time spent personally by me on the following activities:  Blood draw for specimens, development of treatment plan with patient or surrogate, discussions with consultants, ordering and performing treatments and interventions, ordering and review of laboratory studies, ordering and review of radiographic studies, pulse oximetry, re-evaluation of patient's condition, evaluation of patient's response to treatment, examination of patient, obtaining history from patient or surrogate and review of old charts   Care discussed with: admitting provider     Medications Ordered in ED Medications  sodium bicarbonate injection 50 mEq (has no administration in time range)  sodium zirconium cyclosilicate (LOKELMA) packet 10 g (has no administration in time range)  calcium gluconate inj 10% (1 g) URGENT USE ONLY! (has no administration in time range)  lactated ringers bolus 500 mL (has no administration in time range)  ceFEPIme (MAXIPIME) 2 g in sodium chloride 0.9 % 100 mL IVPB (has no administration in time range)  metroNIDAZOLE (FLAGYL) IVPB 500 mg (has no  administration in time range)  vancomycin (VANCOREADY) IVPB 2000 mg/400 mL (has no administration in time range)  dextrose 50 % solution 50 mL (has no administration in time range)  insulin aspart (novoLOG) injection 10 Units (has no administration in time range)  acetaminophen (TYLENOL) tablet 650 mg (650 mg Oral Given 01/10/21 2317)    ED Course  I have reviewed the triage vital signs and the nursing notes.  Pertinent labs & imaging results that were available during my care of the patient were reviewed by me and considered in my medical decision making (see chart for details).    MDM Rules/Calculators/A&P                          24 hours of vomiting, diarrhea, abdominal pain with fever.  Code sepsis activated on arrival.  EKG shows peaked T waves and potassium was 6.5.  She has missed 1 dialysis session. Low suspicion for ACS or PE.  Patient will be given Lokelma, bicarb, insulin, D50 and calcium D/w Dr. Arty Baumgartner of nephrology who willa arrange for dialysis later this morning.   Patient given broad-spectrum antibiotics and judicious IV fluids after cultures are obtained.  He does not make  urine. Chest x-ray is concerning for interstitial edema No hypoxia or increased work of breathing.  With her vomiting, diarrhea and abdominal pain, CT imaging is obtained.  No bowel obstruction or other acute finding.  Found to be COVID-positive Will initiate IV remdesivir.  Does not qualify for steroids as no hypoxia.  Admit for dialysis later today. No hypoxia or increased work of breathing.  Continue IV antibiotics with cultures are pending.  Admission discussed with Dr. Serita Kyle was evaluated in Emergency Department on 01/11/2021 for the symptoms described in the history of present illness. She was evaluated in the context of the global COVID-19 pandemic, which necessitated consideration that the patient might be at risk for infection with the SARS-CoV-2 virus that  causes COVID-19. Institutional protocols and algorithms that pertain to the evaluation of patients at risk for COVID-19 are in a state of rapid change based on information released by regulatory bodies including the CDC and federal and state organizations. These policies and algorithms were followed during the patient's care in the ED.  Final Clinical Impression(s) / ED Diagnoses Final diagnoses:  Fever, unspecified fever cause  Nausea vomiting and diarrhea  COVID-19    Rx / DC Orders ED Discharge Orders     None        Suzannah Bettes, Annie Main, MD 01/11/21 [phone]

## 2021-01-11 NOTE — H&P (Addendum)
History and Physical    Desiree [name] GQQ:[id number] DOB: 02/16/1976 DOA: 01/10/2021  Referring MD/NP/PA: Gean Birchwood, MD PCP: Harvie Junior, MD  Patient coming from: Home via EMS  Chief Complaint: Nausea, vomiting, abdominal pain, and diarrhea  I have personally briefly reviewed patient's old medical records in [place]   HPI: Desiree [name] is a 45 y.o. female with medical history significant of HTN, ESRD on HD (TTS), anemia of chronic disease, GERD, anxiety, and chronic pain presents with complaints of nausea, vomiting, abdominal pain, and diarrhea for the last 2 days.  She last dialyzed 4 days ago.  While she was there she was bedside at that she knows his been sick for the last couple of weeks.  She was scheduled to go to dialysis 2 days ago, but when she woke up had acute onset of nausea, vomiting, and diarrhea.  Emesis has been nonbloody and nonbilious in appearance.  She has been unable to keep any significant food or liquids down.  At home she did not know that she had fever, but has felt awful.  Associated symptoms included cough, shortness of breath, shakes, and reports of a boil underneath right breast.  Patient admits that she is not been vaccinated against COVID-19 because she always has a bad reaction to vaccines.  ED Course: On admission into the emergency department patient was seen to be febrile up to 102.8 F, respirations 17-34, blood pressures elevated up to 186/92, and O2 saturations currently maintained on room air.  Labs since 10/28 significant for potassium 6.5 with  repeat >7.5, BUN 61, creatinine 19.19, anion gap 16, glucose 76.  Chest x-ray noted cardiomegaly with mild perihilar and interstitial congestion without frank pulmonary edema.  COVID-19 screening was positive.  Blood cultures were obtained.  Patient had received calcium total of 3 g of calcium gluconate, amp of D50, 10 units of insulin, sodium bicarb 2 A, Lokelma 20 g, pain medication,  lactated Ringer's 500 mL remdesivir, vancomycin, metronidazole, and cefepime.  Review of Systems  Constitutional:  Positive for fever.  HENT:  Positive for congestion. Negative for nosebleeds.   Eyes:  Negative for photophobia and pain.  Respiratory:  Positive for cough and shortness of breath.   Cardiovascular:  Positive for chest pain. Negative for leg swelling.  Gastrointestinal:  Positive for abdominal pain, diarrhea, nausea and vomiting.  Genitourinary:  Negative for dysuria and frequency.  Musculoskeletal:  Positive for myalgias.  Skin:  Negative for itching.  Neurological:  Negative for focal weakness and loss of consciousness.  Psychiatric/Behavioral:  Negative for substance abuse.    Past Medical History:  Diagnosis Date   Anemia    Anxiety    panic attacks   Arthritis    bilateral knees   Childhood asthma    Complication of anesthesia    "sometimes it does not work; didn't during LEEP OR" (01/21/2016)   Depression    no med   ESRD (end stage renal disease) on dialysis ([place])    "TTS; [facility]; [facility]" (08/03/2016)   GERD (gastroesophageal reflux disease)    nexium prn   Gout    History of blood transfusion    "related to kidneys; I've had 4" (01/21/2016)   Hypertension    Migraine    last one 01/18/19   Preterm labor ~ 2014   Renal insufficiency    Retina hole, left    Seizures ([place])    "last one was in 2000; related to preeclampsia" (01/21/2016)  Past Surgical History:  Procedure Laterality Date   AV FISTULA PLACEMENT Left 2010   AV FISTULA PLACEMENT Left 01/20/2019   Procedure: ARTERIOVENOUS (AV) FISTULA CREATION LEFT UPPER ARM;  Surgeon: Angelia Mould, MD;  Location: [place];  Service: Vascular;  Laterality: Left;   CERVICAL BIOPSY  W/ LOOP ELECTRODE EXCISION  2001   DILATION AND EVACUATION  08/02/2011   Procedure: DILATATION AND EVACUATION;  Surgeon: Logan Bores, MD;  Location: [facility];  Service: Gynecology;;   DILATION AND  EVACUATION N/A 08/31/2013   Procedure: DILATATION AND EVACUATION;  Surgeon: Woodroe Mode, MD;  Location: [facility];  Service: Gynecology;  Laterality: N/A;   FISTULA SUPERFICIALIZATION Left 01/20/2019   Procedure: Fistula Superficialization;  Surgeon: Angelia Mould, MD;  Location: [place];  Service: Vascular;  Laterality: Left;   HYDRADENITIS EXCISION Right    RENAL BIOPSY     REVISION OF ARTERIOVENOUS GORETEX GRAFT Left 02/11/2013   Procedure: REVISION OF ARTERIOVENOUS GORTEX FISTULA;  Surgeon: Rosetta Posner, MD;  Location: [facility];  Service: Vascular;  Laterality: Left;   THROMBECTOMY W/ EMBOLECTOMY Left 05/25/2018   Procedure: REPAIR OF BLEEDING ARTERIOVENOUS FISTULA;  Surgeon: Serafina Mitchell, MD;  Location: [facility];  Service: Vascular;  Laterality: Left;     reports that she is a non-smoker but has been exposed to tobacco smoke. She has never used smokeless tobacco. She reports current drug use. Drug: Marijuana. She reports that she does not drink alcohol.  Allergies  Allergen Reactions   Morphine Shortness Of Breath and Anaphylaxis   Prednisone Other (See Comments)    Other reaction(s): Other (See Comments) Muscle spasms Patient says prednisone causes her to cramp all over, muscle spasms uncontrolled   Tuna [Fish Allergy] Itching, Swelling, Rash and Other (See Comments)    Face droops also   Amlodipine     Angioedema (09/05/17 ED visit)   Iodinated Diagnostic Agents Itching   Tape Itching    Adhesive tape   paper tape ok    Family History  Problem Relation Age of Onset   Diabetes Mother    Hyperlipidemia Mother    Hypertension Mother    Heart disease Mother    Hypertension Other    Diabetes type II Other     Prior to Admission medications   Medication Sig Start Date End Date Taking? Authorizing Provider  albuterol (PROVENTIL HFA;VENTOLIN HFA) 108 (90 Base) MCG/ACT inhaler Inhale 2 puffs into the lungs every 4 (four) hours as needed for wheezing or shortness of breath.  05/02/16  Yes Forde Dandy, MD  alprazolam Duanne Moron) 2 MG tablet Take 2 mg by mouth 3 (three) times daily as needed for anxiety. 12/30/18  Yes [provider]  AURYXIA 1 GM 210 MG(Fe) tablet Take 420 mg by mouth 3 (three) times daily. 01/02/21  Yes [provider]  B Complex-C-Folic Acid (DIALYVITE 275) 0.8 MG WAFR Take 1 tablet by mouth at bedtime. 03/23/19  Yes [provider]  calcium acetate (PHOSLO) 667 MG capsule Take 667-1,334 mg by mouth See admin instructions. 3 capsules with meals  1 to 2 capsules with snacks 11/30/17  Yes [provider]  cinacalcet (SENSIPAR) 60 MG tablet Take 60 mg by mouth every evening.   Yes [provider]  cyclobenzaprine (FLEXERIL) 10 MG tablet Take 10 mg by mouth 2 (two) times daily as needed for muscle spasms.   Yes [provider]  diphenhydrAMINE (BENADRYL) 25 MG tablet Take 1 tablet (25 mg  total) by mouth every 6 (six) hours. Patient taking differently: Take 25 mg by mouth every 6 (six) hours as needed for itching or allergies. 09/05/17  Yes Domenic Moras, PA-C  NARCAN 4 MG/0.1ML LIQD nasal spray kit Place 1 spray into the nose as needed (accidental overdose). 11/14/18  Yes [provider]  omeprazole (PRILOSEC) 20 MG capsule Take 20 mg by mouth daily as needed (acid reflux).  03/31/18  Yes [provider]  ondansetron (ZOFRAN-ODT) 8 MG disintegrating tablet Take 8 mg by mouth every 8 (eight) hours as needed for nausea or vomiting.    Yes [provider]  Oxycodone HCl 10 MG TABS Take 10 mg by mouth in the morning, at noon, and at bedtime. 05/17/19  Yes [provider]  sevelamer carbonate (RENVELA) 800 MG tablet Take 1,600 mg by mouth 3 (three) times daily. 05/18/19  Yes [provider]    Physical Exam:  Constitutional: Elderly female who appears to be acutely ill with rigors Vitals:   01/11/21 0303 01/11/21 0445 01/11/21 0700 01/11/21 0715  BP: (!) 172/81 (!) 168/77  (!) 186/92 (!) 183/73  Pulse: 60 84 73 74  Resp: 17 (!) 24 (!) 34 (!) 25  Temp:      TempSrc:      SpO2: 100% 99% 100% 100%  Weight:      Height:       Eyes: PERRL, lids and conjunctivae normal ENMT: Mucous membranes are moist. Posterior pharynx clear of any exudate or lesions.  Neck: normal, supple, no masses, no thyromegaly Respiratory: Mildly tachypneic with crackles noted on the lower lung fields without significant wheezes or rhonchi appreciated.  Currently on 1 L nasal cannula oxygen with O2 saturations maintained. Cardiovascular: Regular rate and rhythm.  No lower extremity edema appreciated.  Left upper extremity fistula present. Abdomen: no tenderness, no masses palpated. No hepatosplenomegaly. Bowel sounds positive.  Musculoskeletal: no clubbing / cyanosis. No joint deformity upper and lower extremities. Good ROM, no contractures. Normal muscle tone.  Skin: no rashes, lesions, ulcers. No induration Neurologic: CN 2-12 grossly intact.  Psychiatric: Normal judgment and insight. Alert and oriented x 3. Normal mood.     Labs on Admission: I have personally reviewed following labs and imaging studies  CBC: Recent Labs  Lab 01/10/21 2323  WBC 5.3  HGB 10.7*  HCT 32.9*  MCV 93.7  PLT 782   Basic Metabolic Panel: Recent Labs  Lab 01/10/21 2323 01/11/21 0327  NA 135 134*  K 6.5* >7.5*  CL 93* 93*  CO2 23 25  GLUCOSE 82 76  BUN 60* 61*  CREATININE 18.54* 19.17*  CALCIUM 9.8 9.7   GFR: Estimated Creatinine Clearance: 4.3 mL/min (A) (by C-G formula based on SCr of 19.17 mg/dL (H)). Liver Function Tests: Recent Labs  Lab 01/11/21 0327  AST 10*  ALT 8  ALKPHOS 48  BILITOT 1.3*  PROT 7.2  ALBUMIN 3.5   Recent Labs  Lab 01/11/21 0327  LIPASE 45   No results for input(s): AMMONIA in the last 168 hours. Coagulation Profile: Recent Labs  Lab 01/10/21 2323  INR 1.2   Cardiac Enzymes: No results for input(s): CKTOTAL, CKMB, CKMBINDEX, TROPONINI in the  last 168 hours. BNP (last 3 results) No results for input(s): PROBNP in the last 8760 hours. HbA1C: No results for input(s): HGBA1C in the last 72 hours. CBG: No results for input(s): GLUCAP in the last 168 hours. Lipid Profile: No results for input(s): CHOL, HDL, LDLCALC, TRIG, CHOLHDL, LDLDIRECT  in the last 72 hours. Thyroid Function Tests: No results for input(s): TSH, T4TOTAL, FREET4, T3FREE, THYROIDAB in the last 72 hours. Anemia Panel: No results for input(s): VITAMINB12, FOLATE, FERRITIN, TIBC, IRON, RETICCTPCT in the last 72 hours. Urine analysis:    Component Value Date/Time   COLORURINE YELLOW 12/20/2013 0530   APPEARANCEUR CLOUDY (A) 12/20/2013 0530   LABSPEC 1.009 12/20/2013 0530   PHURINE 8.0 12/20/2013 0530   GLUCOSEU 100 (A) 12/20/2013 0530   HGBUR LARGE (A) 12/20/2013 0530   BILIRUBINUR NEGATIVE 12/20/2013 0530   KETONESUR NEGATIVE 12/20/2013 0530   PROTEINUR 100 (A) 12/20/2013 0530   UROBILINOGEN 0.2 12/20/2013 0530   NITRITE NEGATIVE 12/20/2013 0530   LEUKOCYTESUR TRACE (A) 12/20/2013 0530   Sepsis Labs: Recent Results (from the past 240 hour(s))  Resp Panel by RT-PCR (Flu A&B, Covid) Nasopharyngeal Swab     Status: Abnormal   Collection Time: 01/11/21 12:55 AM   Specimen: Nasopharyngeal Swab; Nasopharyngeal(NP) swabs in vial transport medium  Result Value Ref Range Status   SARS Coronavirus 2 by RT PCR POSITIVE (A) NEGATIVE Final    Comment: RESULT CALLED TO, READ BACK BY AND VERIFIED WITH: K WILLIAMS,RN@[id number]  01/11/21 [place] (NOTE) SARS-CoV-2 target nucleic acids are DETECTED.  The SARS-CoV-2 RNA is generally detectable in upper respiratory specimens during the acute phase of infection. Positive results are indicative of the presence of the identified virus, but do not rule out bacterial infection or co-infection with other pathogens not detected by the test. Clinical correlation with patient history and other diagnostic information is necessary to  determine patient infection status. The expected result is Negative.  Fact Sheet for Patients: EntrepreneurPulse.com.au  Fact Sheet for Healthcare Providers: IncredibleEmployment.be  This test is not yet approved or cleared by the [place] FDA and  has been authorized for detection and/or diagnosis of SARS-CoV-2 by FDA under an Emergency Use Authorization (EUA).  This EUA will remain in effect (meaning this test can be [place] ed) for the duration of  the COVID-19 declaration under Section 564(b)(1) of the Act, 21 U.S.C. section 360bbb-3(b)(1), unless the authorization is terminated or revoked sooner.     Influenza A by PCR NEGATIVE NEGATIVE Final   Influenza B by PCR NEGATIVE NEGATIVE Final    Comment: (NOTE) The Xpert Xpress SARS-CoV-2/FLU/RSV plus assay is intended as an aid in the diagnosis of influenza from Nasopharyngeal swab specimens and should not be used as a sole basis for treatment. Nasal washings and aspirates are unacceptable for Xpert Xpress SARS-CoV-2/FLU/RSV testing.  Fact Sheet for Patients: EntrepreneurPulse.com.au  Fact Sheet for Healthcare Providers: IncredibleEmployment.be  This test is not yet approved or cleared by the [place] FDA and has been authorized for detection and/or diagnosis of SARS-CoV-2 by FDA under an Emergency Use Authorization (EUA). This EUA will remain in effect (meaning this test can be used) for the duration of the COVID-19 declaration under Section 564(b)(1) of the Act, 21 U.S.C. section 360bbb-3(b)(1), unless the authorization is terminated or revoked.  Performed at [facility], [place] [address], [place], [place] [address]      Radiological Exams on Admission: DG Chest 2 View  Result Date: 01/11/2021 CLINICAL DATA:  Initial evaluation for acute chest pain. EXAM: CHEST - 2 VIEW COMPARISON:  Radiograph from 06/12/2019. FINDINGS: Mild  cardiomegaly.  Mediastinal silhouette within normal limits. Lungs mildly hypoinflated. Mild perihilar and interstitial congestion without frank pulmonary edema. No visible pleural effusion. No consolidative airspace disease. No pneumothorax. Visualized osseous structures within normal limits.  IMPRESSION: 1. Cardiomegaly with mild perihilar and interstitial congestion without frank pulmonary edema. 2. No other active cardiopulmonary disease. Electronically Signed   By: Jeannine Boga M.D.   On: 01/11/2021 00:39   CT CHEST ABDOMEN PELVIS WO CONTRAST  Result Date: 01/11/2021 CLINICAL DATA:  Abdominal pain and fever. Emesis and diarrhea. Short of breath. Dialysis patient. Positive COVID EXAM: CT CHEST, ABDOMEN AND PELVIS WITHOUT CONTRAST TECHNIQUE: Multidetector CT imaging of the chest, abdomen and pelvis was performed following the standard protocol without IV contrast. COMPARISON:  CT 05/02/2018 FINDINGS: CT CHEST FINDINGS Cardiovascular: No significant vascular findings. Normal heart size. No pericardial effusion. Atherosclerotic calcification of the aorta. Mediastinum/Nodes: No axillary or supraclavicular adenopathy. No mediastinal or hilar adenopathy. No pericardial fluid. Esophagus normal. Lungs/Pleura: Mild ground-glass opacities in the lungs in a diffuse pattern. For example superior segment of the RIGHT lower lobe (image 56/5). No focal consolidation. No pleural fluid. No pneumothorax. Airways normal Musculoskeletal: No aggressive osseous lesion. CT ABDOMEN AND PELVIS FINDINGS Hepatobiliary: No focal hepatic lesion. No biliary ductal dilatation. Gallbladder is normal. Common bile duct is normal. Pancreas: No pancreatic inflammation. Spleen: Normal spleen Adrenals/urinary tract: Adrenal glands are normal. The ureters and bladder normal. Large periphery calcified cystic mass emanating from the lower pole of the LEFT kidney has uniform central simple fluid attenuation. No change from prior. Cyst  measures up to 9 cm. Kidneys are atrophic. Bladder collapsed Stomach/Bowel: Stomach, small bowel, appendix, and cecum are normal. The colon and rectosigmoid colon are normal. Vascular/Lymphatic: Abdominal aorta is normal caliber with atherosclerotic calcification. There is no retroperitoneal or periportal lymphadenopathy. No pelvic lymphadenopathy. Reproductive: Uterus and adnexa unremarkable. Other: Is Musculoskeletal: No aggressive osseous lesion. IMPRESSION: Chest Impression: 1. Mild pulmonary edema pattern in the lungs. 2. No focal consolidation Abdomen / Pelvis Impression: 1. No acute findings abdomen pelvis. 2. Large benign appearing cyst of the LEFT kidney. 3. Bilateral atrophic kidneys. 4.  Aortic Atherosclerosis (ICD10-I70.0). Electronically Signed   By: Suzy Bouchard M.D.   On: 01/11/2021 06:05    EKG: Independently reviewed.  Normal sinus rhythm at 67 bpm with T wave peaking.  Assessment/Plan Sepsis possibly secondary to COVID-19: Patient was initially noted to be febrile up to 102.8 F with tachypnea meeting SIRS criteria.  Imaging studies did not note pulmonary edema pattern in lungs.  O2 saturations currently maintained on room air.  Lactic acid was elevated at 3.  Patient was noted to be positive for COVID-19.  No other clear source of infection noted at this time on chest x-ray or CT imaging of the abdomen and pelvis.  Sepsis could be secondary to COVID-19 infection.  Patient was bolused 500 mL IV fluids in addition to being started on empiric antibiotics of vancomycin, cefepime, and metronidazole in case of underlying infection. -Admit to a progressive bed -COVID-19 admission order set utilized -Continuous pulse oximetry with nasal cannula oxygen as needed to maintain O2 saturation greater than 92% -Follow-up blood cultures -Check inflammatory markers and continue to monitor daily -Continue empiric antibiotics of vancomycin and cefepime.  De-escalate when medically  appropriate -Albuterol inhaler -Remdesivir per pharmacy -Vitamin C and zinc -Antitussives as needed -Tylenol as needed for fever -Trend lactic acid level  Hyperkalemia: Acute.  Potassium elevated on repeat check greater than 7.5.  Had been given calcium gluconate 3 g IV, insulin, Lokelma 20 g, and 2 A of sodium bicarb.  Nephrology was made aware and plan on getting the patient dialyzed. -Continue to monitor potassium levels until able to get  dialyzed -Plan to continue to treat as needed  Pulmonary edema ESRD on HD: Patient had been noted to have missed hemodialysis yesterday.  Imaging studies did note mild pulmonary edema.  Patient was able to maintain O2 saturations on room air, but was placed on 1 L nasal cannula oxygen for comfort. -Continuous pulse oximetry with nasal cannula oxygen as needed -Continue home medication regimen -HD per nephrology  Nausea, vomiting, and diarrhea: Acute.  Suspected possibly to be rate related with COVID.  Other causes include gastroenteritis or withdrawal from opioid pain medication if patient had recently ran out. -Antiemetics as needed -Diet as tolerated -Imodium as needed for diarrhea  Hypoglycemia due to insulin: Patient was noted to have a drop in blood sugars down to 64 after she had been given insulin and attempts to get potassium levels down prior to being dialyzed. -Hypoglycemic protocols -D50 as needed for blood sugars less than 70  Elevated anion gap: On initial labs from yesterday patient was noted to have anion gap of 19 with CO2 23.  Patient had received 2 A of sodium bicarb.  Repeat anion gap 16 with CO2 25. -Continue to monitor  Anemia of chronic disease: Stable.  Hemoglobin 10.7 g/dL which appears near patient's baseline. -Continue ferrous citrate  Hyponatremia: Sodium 134 which appears likely secondary to fluid overload. -Continue to monitor  Hyperbilirubinemia: Total bilirubin is mildly elevated at 1.3.  Suspect related to  hepatic congestion. -Continue to monitor  Anxiety -Continue Xanax as needed  Chronic pain: Patient on oxycodone 10 mg 3 times daily. -Continue oxycodone as needed  Boil: underneath right breast. -May need I&D after acute issues of resolved  Large left kidney cyst: Suddenly seen on CT scan.  GERD: Home medication regimen includes Prilosec 20 mg as needed for reflux. -Scheduled substitution of Protonix    DVT prophylaxis: Heparin Code Status: Full Family Communication: husband updated over the phone Disposition Plan: Hopefully discharge home once medically stable Consults called: Nephrology Admission status: Inpatient, require more than 2 midnight stay due to severity of illness and need of close clinical monitoring in the setting of sepsis  Norval Morton MD [facility]   If 7PM-7AM, please contact night-coverage   01/11/2021, 7:47 AM

## 2021-01-11 NOTE — ED Notes (Signed)
Pt states she no longer makes urine

## 2021-01-11 NOTE — ED Notes (Signed)
I encouraged pt to sit in recliner chair and I explained that it would be better for her lungs if she did that, but she refused.

## 2021-01-11 NOTE — Progress Notes (Signed)
Received critical hgb from lab. Possible error in collection. Labs redrawn and sent to lab.

## 2021-01-11 NOTE — Progress Notes (Signed)
Writer with no access to accuchek machine. 4M called for help. Charge nurse states possibly 30 minutes until someone can come check blood sugar.

## 2021-01-11 NOTE — Progress Notes (Signed)
Pt being followed by [facility] for Sepsis protocol. 

## 2021-01-11 NOTE — Progress Notes (Signed)
Dialysate changed to 2k per order

## 2021-01-11 NOTE — ED Notes (Signed)
Patient tray ordered

## 2021-01-11 NOTE — Progress Notes (Signed)
Attempted to get accuchek again. 19M states someone will be down to help obtain glucose. Glucose discussed in report to ED nurse.

## 2021-01-11 NOTE — Progress Notes (Signed)
Dialysate changed to 1k per order.

## 2021-01-11 NOTE — Consult Note (Addendum)
[name] Renal Consultation Note  Indication for Consultation:  Management of ESRD/hemodialysis; anemia, hypertension/volume and secondary hyperparathyroidism  HPI: Desiree [name] is a 45 y.o. female with ESRD on HD TTS, history of hypertension, asthma. Presented to the ER with 2-day history of nausea, vomiting, diarrhea, abdominal pain missed her last dialysis, temperature 102.8, respiration 1713 O2 sat maintained on room air admitting lab K6 point 5 repeat 7.5 chest x-ray without cardiomegaly with mild perihilar and interstitial congestion but no frank pulmonary edema.  Found to be COVID-19 positive. We are consulted for HD ESRD needs, hyperkalemia treated medically until HD today.      Past Medical History:  Diagnosis Date   Anemia    Anxiety    panic attacks   Arthritis    bilateral knees   Childhood asthma    Complication of anesthesia    "sometimes it does not work; didn't during LEEP OR" (01/21/2016)   Depression    no med   ESRD (end stage renal disease) on dialysis ([place])    "TTS; [facility]; [facility]" (08/03/2016)   GERD (gastroesophageal reflux disease)    nexium prn   Gout    History of blood transfusion    "related to kidneys; I've had 4" (01/21/2016)   Hypertension    Migraine    last one 01/18/19   Preterm labor ~ 2014   Renal insufficiency    Retina hole, left    Seizures ([place])    "last one was in 2000; related to preeclampsia" (01/21/2016)    Past Surgical History:  Procedure Laterality Date   AV FISTULA PLACEMENT Left 2010   AV FISTULA PLACEMENT Left 01/20/2019   Procedure: ARTERIOVENOUS (AV) FISTULA CREATION LEFT UPPER ARM;  Surgeon: Angelia Mould, MD;  Location: [place];  Service: Vascular;  Laterality: Left;   CERVICAL BIOPSY  W/ LOOP ELECTRODE EXCISION  2001   DILATION AND EVACUATION  08/02/2011   Procedure: DILATATION AND EVACUATION;  Surgeon: Logan Bores, MD;  Location: [place] [facility];  Service: Gynecology;;   DILATION  AND EVACUATION N/A 08/31/2013   Procedure: DILATATION AND EVACUATION;  Surgeon: Woodroe Mode, MD;  Location: [place] [facility];  Service: Gynecology;  Laterality: N/A;   FISTULA SUPERFICIALIZATION Left 01/20/2019   Procedure: Fistula Superficialization;  Surgeon: Angelia Mould, MD;  Location: [place];  Service: Vascular;  Laterality: Left;   HYDRADENITIS EXCISION Right    RENAL BIOPSY     REVISION OF ARTERIOVENOUS GORETEX GRAFT Left 02/11/2013   Procedure: REVISION OF ARTERIOVENOUS GORTEX FISTULA;  Surgeon: Rosetta Posner, MD;  Location[facility];  Service: Vascular;  Laterality: Left;   THROMBECTOMY W/ EMBOLECTOMY Left 05/25/2018   Procedure: REPAIR OF BLEEDING ARTERIOVENOUS FISTULA;  Surgeon: Serafina Mitchell, MD;  Location: [facility];  Service: Vascular;  Laterality: Left;      Family History  Problem Relation Age of Onset   Diabetes Mother    Hyperlipidemia Mother    Hypertension Mother    Heart disease Mother    Hypertension Other    Diabetes type II Other       reports that she is a non-smoker but has been exposed to tobacco smoke. She has never used smokeless tobacco. She reports current drug use. Drug: Marijuana. She reports that she does not drink alcohol.   Allergies  Allergen Reactions   Morphine Shortness Of Breath and Anaphylaxis   Prednisone Other (See Comments)    Other reaction(s): Other (See Comments) Muscle spasms Patient says  prednisone causes her to cramp all over, muscle spasms uncontrolled   Tuna [Fish Allergy] Itching, Swelling, Rash and Other (See Comments)    Face droops also   Amlodipine     Angioedema (09/05/17 ED visit)   Iodinated Diagnostic Agents Itching   Tape Itching    Adhesive tape   paper tape ok    Prior to Admission medications   Medication Sig Start Date End Date Taking? Authorizing Provider  albuterol (PROVENTIL HFA;VENTOLIN HFA) 108 (90 Base) MCG/ACT inhaler Inhale 2 puffs into the lungs every 4 (four) hours as needed for wheezing or shortness of  breath. 05/02/16  Yes Forde Dandy, MD  alprazolam Duanne Moron) 2 MG tablet Take 2 mg by mouth 3 (three) times daily as needed for anxiety. 12/30/18  Yes [provider]  AURYXIA 1 GM 210 MG(Fe) tablet Take 420 mg by mouth 3 (three) times daily. 01/02/21  Yes [provider]  B Complex-C-Folic Acid (DIALYVITE 256) 0.8 MG WAFR Take 1 tablet by mouth at bedtime. 03/23/19  Yes [provider]  calcium acetate (PHOSLO) 667 MG capsule Take 667-1,334 mg by mouth See admin instructions. 3 capsules with meals  1 to 2 capsules with snacks 11/30/17  Yes [provider]  cinacalcet (SENSIPAR) 60 MG tablet Take 60 mg by mouth every evening.   Yes [provider]  cyclobenzaprine (FLEXERIL) 10 MG tablet Take 10 mg by mouth 2 (two) times daily as needed for muscle spasms.   Yes [provider]  diphenhydrAMINE (BENADRYL) 25 MG tablet Take 1 tablet (25 mg total) by mouth every 6 (six) hours. Patient taking differently: Take 25 mg by mouth every 6 (six) hours as needed for itching or allergies. 09/05/17  Yes Domenic Moras, PA-C  NARCAN 4 MG/0.1ML LIQD nasal spray kit Place 1 spray into the nose as needed (accidental overdose). 11/14/18  Yes [provider]  omeprazole (PRILOSEC) 20 MG capsule Take 20 mg by mouth daily as needed (acid reflux).  03/31/18  Yes [provider]  ondansetron (ZOFRAN-ODT) 8 MG disintegrating tablet Take 8 mg by mouth every 8 (eight) hours as needed for nausea or vomiting.    Yes [provider]  Oxycodone HCl 10 MG TABS Take 10 mg by mouth in the morning, at noon, and at bedtime. 05/17/19  Yes [provider]  sevelamer carbonate (RENVELA) 800 MG tablet Take 1,600 mg by mouth 3 (three) times daily. 05/18/19  Yes [provider]      Results for orders placed or performed during the hospital encounter of 01/10/21 (from the past 48 hour(s))  Basic metabolic panel     Status: Abnormal   Collection Time:  01/10/21 11:23 PM  Result Value Ref Range   Sodium 135 135 - 145 mmol/L   Potassium 6.5 (HH) 3.5 - 5.1 mmol/L    Comment: NO VISIBLE HEMOLYSIS CRITICAL RESULT CALLED TO, READ BACK BY AND VERIFIED WITH: Farley Ly RN 01/11/21 0044 M KOROLESKI    Chloride 93 (L) 98 - 111 mmol/L   CO2 23 22 - 32 mmol/L   Glucose, Bld 82 70 - 99 mg/dL    Comment: Glucose reference range applies only to samples taken after fasting for at least 8 hours.   BUN 60 (H) 6 - 20 mg/dL   Creatinine, Ser 18.54 (H) 0.44 - 1.00 mg/dL   Calcium 9.8 8.9 - 10.3 mg/dL   GFR, Estimated 2 (L) >60 mL/min    Comment: (NOTE) Calculated using the CKD-EPI  Creatinine Equation (2021)    Anion gap 19 (H) 5 - 15    Comment: Performed at [facility], [place] [address], [place], [place] [address]  CBC     Status: Abnormal   Collection Time: 01/10/21 11:23 PM  Result Value Ref Range   WBC 5.3 4.0 - 10.5 K/uL   RBC 3.51 (L) 3.87 - 5.11 MIL/uL   Hemoglobin 10.7 (L) 12.0 - 15.0 g/dL   HCT 32.9 (L) 36.0 - 46.0 %   MCV 93.7 80.0 - 100.0 fL   MCH 30.5 26.0 - 34.0 pg   MCHC 32.5 30.0 - 36.0 g/dL   RDW 19.1 (H) 11.5 - 15.5 %   Platelets 167 150 - 400 K/uL   nRBC 0.0 0.0 - 0.2 %    Comment: Performed at [facility], [place] [address] [place], [place] [address]  Troponin I (High Sensitivity)     Status: None   Collection Time: 01/10/21 11:23 PM  Result Value Ref Range   Troponin I (High Sensitivity) 14 <18 ng/L    Comment: (NOTE) Elevated high sensitivity troponin I (hsTnI) values and significant  changes across serial measurements may suggest ACS but many other  chronic and acute conditions are known to elevate hsTnI results.  Refer to the Links section for chest pain algorithms and additional  guidance. Performed at [facility], [place] [address], [place], [place] [address]   Protime-INR (order if Patient is taking Coumadin / Warfarin)     Status: None   Collection Time: 01/10/21 11:23 PM  Result Value Ref  Range   Prothrombin Time 15.1 11.4 - 15.2 seconds   INR 1.2 0.8 - 1.2    Comment: (NOTE) INR goal varies based on device and disease states. Performed at [facility], [place] [address] [place], [place] [address]   I-Stat beta hCG blood, ED     Status: None   Collection Time: 01/11/21 12:31 AM  Result Value Ref Range   I-stat hCG, quantitative <5.0 <5 mIU/mL   Comment 3            Comment:   GEST. AGE      CONC.  (mIU/mL)   <=1 WEEK        5 - 50     2 WEEKS       50 - 500     3 WEEKS       100 - 10,000     4 WEEKS     1,000 - 30,000        FEMALE AND NON-PREGNANT FEMALE:     LESS THAN 5 mIU/mL   Resp Panel by RT-PCR (Flu A&B, Covid) Nasopharyngeal Swab     Status: Abnormal   Collection Time: 01/11/21 12:55 AM   Specimen: Nasopharyngeal Swab; Nasopharyngeal(NP) swabs in vial transport medium  Result Value Ref Range   SARS Coronavirus 2 by RT PCR POSITIVE (A) NEGATIVE    Comment: RESULT CALLED TO, READ BACK BY AND VERIFIED WITH: K WILLIAMS,RN@0522  01/11/21 [place] (NOTE) SARS-CoV-2 target nucleic acids are DETECTED.  The SARS-CoV-2 RNA is generally detectable in upper respiratory specimens during the acute phase of infection. Positive results are indicative of the presence of the identified virus, but do not rule out bacterial infection or co-infection with other pathogens not detected by the test. Clinical correlation with patient history and other diagnostic information is necessary to determine patient infection status. The expected result is Negative.  Fact Sheet  for Patients: EntrepreneurPulse.com.au  Fact Sheet for Healthcare Providers: IncredibleEmployment.be  This test is not yet approved or cleared by the [place] FDA and  has been authorized for detection and/or diagnosis of SARS-CoV-2 by FDA under an Emergency Use Authorization (EUA).  This EUA will remain in effect (meaning this test can be [place] ed) for the duration of   the COVID-19 declaration under Section 564(b)(1) of the Act, 21 U.S.C. section 360bbb-3(b)(1), unless the authorization is terminated or revoked sooner.     Influenza A by PCR NEGATIVE NEGATIVE   Influenza B by PCR NEGATIVE NEGATIVE    Comment: (NOTE) The Xpert Xpress SARS-CoV-2/FLU/RSV plus assay is intended as an aid in the diagnosis of influenza from Nasopharyngeal swab specimens and should not be used as a sole basis for treatment. Nasal washings and aspirates are unacceptable for Xpert Xpress SARS-CoV-2/FLU/RSV testing.  Fact Sheet for Patients: EntrepreneurPulse.com.au  Fact Sheet for Healthcare Providers: IncredibleEmployment.be  This test is not yet approved or cleared by the [place] FDA and has been authorized for detection and/or diagnosis of SARS-CoV-2 by FDA under an Emergency Use Authorization (EUA). This EUA will remain in effect (meaning this test can be used) for the duration of the COVID-19 declaration under Section 564(b)(1) of the Act, 21 U.S.C. section 360bbb-3(b)(1), unless the authorization is terminated or revoked.  Performed at [facility], [place] [address], [place], [place] [address]   Troponin I (High Sensitivity)     Status: None   Collection Time: 01/11/21  3:26 AM  Result Value Ref Range   Troponin I (High Sensitivity) 17 <18 ng/L    Comment: (NOTE) Elevated high sensitivity troponin I (hsTnI) values and significant  changes across serial measurements may suggest ACS but many other  chronic and acute conditions are known to elevate hsTnI results.  Refer to the "Links" section for chest pain algorithms and additional  guidance. Performed at [facility], [place] [address] [place], [place] [address]   APTT     Status: None   Collection Time: 01/11/21  3:26 AM  Result Value Ref Range   aPTT 30 24 - 36 seconds    Comment: Performed at [place] [address] [place], [place]  [address]  Lipase, blood     Status: None   Collection Time: 01/11/21  3:27 AM  Result Value Ref Range   Lipase 45 11 - 51 U/L    Comment: Performed at [facility], [place] [address] [place], [place] [address]  Hepatic function panel     Status: Abnormal   Collection Time: 01/11/21  3:27 AM  Result Value Ref Range   Total Protein 7.2 6.5 - 8.1 g/dL   Albumin 3.5 3.5 - 5.0 g/dL   AST 10 (L) 15 - 41 U/L   ALT 8 0 - 44 U/L   Alkaline Phosphatase 48 38 - 126 U/L   Total Bilirubin 1.3 (H) 0.3 - 1.2 mg/dL   Bilirubin, Direct 0.1 0.0 - 0.2 mg/dL   Indirect Bilirubin 1.2 (H) 0.3 - 0.9 mg/dL    Comment: Performed at [place] [address], [place], [place] [address]  Basic metabolic panel     Status: Abnormal   Collection Time: 01/11/21  3:27 AM  Result Value Ref Range   Sodium 134 (L) 135 - 145 mmol/L   Potassium >7.5 (HH) 3.5 - 5.1 mmol/L    Comment: CRITICAL RESULT CALLED TO, READ BACK BY  AND VERIFIED WITH: Tilden Dome RN 01/11/21 0621 Wiliam Ke    Chloride 93 (L) 98 - 111 mmol/L   CO2 25 22 - 32 mmol/L   Glucose, Bld 76 70 - 99 mg/dL    Comment: Glucose reference range applies only to samples taken after fasting for at least 8 hours.   BUN 61 (H) 6 - 20 mg/dL   Creatinine, Ser 19.17 (H) 0.44 - 1.00 mg/dL   Calcium 9.7 8.9 - 10.3 mg/dL   GFR, Estimated 2 (L) >60 mL/min    Comment: (NOTE) Calculated using the CKD-EPI Creatinine Equation (2021)    Anion gap 16 (H) 5 - 15    Comment: Performed at [place] [address] [place], [place] [address]  Lactic acid, plasma     Status: Abnormal   Collection Time: 01/11/21  5:14 AM  Result Value Ref Range   Lactic Acid, Venous 3.0 (HH) 0.5 - 1.9 mmol/L    Comment: CRITICAL RESULT CALLED TO, READ BACK BY AND VERIFIED WITH: E BANKS RN BY SSTEPHENS [phone] [id number] Performed at [facility], [place] [address] [place], [place] [address]   CBG monitoring, ED     Status: Abnormal   Collection Time: 01/11/21  8:19  AM  Result Value Ref Range   Glucose-Capillary 106 (H) 70 - 99 mg/dL    Comment: Glucose reference range applies only to samples taken after fasting for at least 8 hours.     ROS: See HPI   Physical Exam: Vitals:   01/11/21 1000 01/11/21 1015  BP: (!) 162/89   Pulse: 82   Resp: 18   Temp:  (!) 100.4 F (38 C)  SpO2: 98%      Physical exam.  Results as noted by ER /admit team/follow up on  HD  2/2 covid   Dialysis Orders: Center: [facility] form TTS 3 hours 45 minutes EDW at 90 kg(noted last 3 treatments leaving below 88.6, 88.4, 88.5 kg) 2K, 2 CA bath, L UA aVF Heparin 6600  Mircera 100 last 01/02/21 Hectorol 5  Assessment/Plan Hyperkalemia missed dialysis X1= given Lokelma bicarb insulin D50 calcium then hemodialysis today ESRD -HD TTS, HD today , needs isolation secondary to COVID Abdominal pain with nausea vomiting diarrhea= per admit Sepsis possibly secondary to COVID-positive= IV remdesivir started in ER, not hypoxic thus no steroids Hypertension/volume  -bp on admission, 186/90 ,O2 sat okay on room air, uses p.o. midodrine for BP on dialysis chest x-ray and megaly with mild perihilar interstitial congestion but no frank pulmonary edema HD today as tolerated May need lower dry weight living below her outpatient/weight loss secondary to COVID possible Anemia  -Hgb 10.7 dose ESA next week Metabolic bone disease -Hectorol on admission and binders needing Nutrition -ALB 3.5  Ernest Haber, PA-C [place] [phone] 01/11/2021, 11:38 AM    I have seen and examined this patient and agree with plan and assessment in the above note with renal recommendations/intervention highlighted.  Plan for HD as soon as we can have staff to perform HD in isolation room due to covid+. Governor R[place]s Jermaine Neuharth,MD 01/11/2021 12:13 PM

## 2021-01-11 NOTE — Progress Notes (Signed)
Pharmacy Antibiotic Note  Desiree [name] is a 45 y.o. female admitted on 01/10/2021 with sepsis.  Pharmacy has been consulted for Cefepime and Vancomycin dosing. Patient is COVID positive and has ESRD on dialysis Tuesdays/Thursdays/Saturdays - last HD ?[facility] Kitchen Patient no longer makes any urine per patient report to RN this AM. Tmax 102.8. WBC 5.3. Lactic acid 3. K >7.5 this AM.   Cefepime 2g and Vancomycin 2g given in ED this AM.   Plan: No scheduled Vancomycin - follow-up for dialysis schedule.  Cefepime 1g IV every 24 hours until dialysis schedule confirmed.  Monitor clinical status and culture results.   Height: 5\' 5"  (165.1 cm) Weight: 95 kg (209 lb 7 oz) IBW/kg (Calculated) : 57  Temp (24hrs), Avg:101.6 F (38.7 C), Min:100.9 F (38.3 C), Max:102.8 F (39.3 C)  Recent Labs  Lab 01/10/21 2323 01/11/21 0327 01/11/21 0514  WBC 5.3  --   --   CREATININE 18.54* 19.17*  --   LATICACIDVEN  --   --  3.0*    Estimated Creatinine Clearance: 4.3 mL/min (A) (by C-G formula based on SCr of 19.17 mg/dL (H)).    Allergies  Allergen Reactions   Morphine Shortness Of Breath and Anaphylaxis   Prednisone Other (See Comments)    Other reaction(s): Other (See Comments) Muscle spasms Patient says prednisone causes her to cramp all over, muscle spasms uncontrolled   Tuna [Fish Allergy] Itching, Swelling, Rash and Other (See Comments)    Face droops also   Amlodipine     Angioedema (09/05/17 ED visit)   Iodinated Diagnostic Agents Itching   Tape Itching    Adhesive tape   paper tape ok    Antimicrobials this admission: Cefepime 10/29 >> Vancomycin 10/29 >>  Dose adjustments this admission:   Microbiology results: 10/29 COVID + 10/29 BCx - sent  Thank you for allowing pharmacy to be a part of this patient's care.  Brain Hilts 01/11/2021 8:59 AM

## 2021-01-11 NOTE — ED Notes (Signed)
I provided pt with a regular gingerale for her low bs. Pt stated she was nauseated and couldn't eat, but would be able to sip gingerale.

## 2021-01-11 NOTE — ED Notes (Signed)
Phlebotomy attempted to get pt's blood, but was unsuccessful. I will notify physician when he returns my call.

## 2021-01-11 NOTE — ED Notes (Signed)
I notified Dr Tamala Julian bw could not be obtained and would need to be drawn in dialysis today.

## 2021-01-12 ENCOUNTER — Inpatient Hospital Stay (HOSPITAL_COMMUNITY): Payer: Medicare Other

## 2021-01-12 LAB — COMPREHENSIVE METABOLIC PANEL
ALT: 9 U/L (ref 0–44)
AST: 23 U/L (ref 15–41)
Albumin: 2.8 g/dL — ABNORMAL LOW (ref 3.5–5.0)
Alkaline Phosphatase: 39 U/L (ref 38–126)
Anion gap: 14 (ref 5–15)
BUN: 33 mg/dL — ABNORMAL HIGH (ref 6–20)
CO2: 27 mmol/L (ref 22–32)
Calcium: 8.8 mg/dL — ABNORMAL LOW (ref 8.9–10.3)
Chloride: 94 mmol/L — ABNORMAL LOW (ref 98–111)
Creatinine, Ser: 11.65 mg/dL — ABNORMAL HIGH (ref 0.44–1.00)
GFR, Estimated: 4 mL/min — ABNORMAL LOW (ref >60)
Glucose, Bld: 114 mg/dL — ABNORMAL HIGH (ref 70–99)
Potassium: 4.8 mmol/L (ref 3.5–5.1)
Sodium: 135 mmol/L (ref 135–145)
Total Bilirubin: 1.2 mg/dL (ref 0.3–1.2)
Total Protein: 6.7 g/dL (ref 6.5–8.1)

## 2021-01-12 LAB — GLUCOSE, CAPILLARY
Glucose-Capillary: 95 mg/dL (ref 70–99)
Glucose-Capillary: 104 mg/dL — ABNORMAL HIGH (ref 70–99)
Glucose-Capillary: 108 mg/dL — ABNORMAL HIGH (ref 70–99)

## 2021-01-12 LAB — HEPATITIS B SURFACE ANTIBODY, QUANTITATIVE: Hep B S AB Quant (Post): 65.6 m[IU]/mL (ref >9.9)

## 2021-01-12 LAB — CBC WITH DIFFERENTIAL/PLATELET
Abs Immature Granulocytes: 0.01 10*3/uL (ref 0.00–0.07)
Basophils Absolute: 0 10*3/uL (ref 0.0–0.1)
Basophils Relative: 1 %
Eosinophils Absolute: 0.1 10*3/uL (ref 0.0–0.5)
Eosinophils Relative: 2 %
HCT: 29.9 % — ABNORMAL LOW (ref 36.0–46.0)
Hemoglobin: 9.6 g/dL — ABNORMAL LOW (ref 12.0–15.0)
Immature Granulocytes: 0 %
Lymphocytes Relative: 28 %
Lymphs Abs: 1 10*3/uL (ref 0.7–4.0)
MCH: 30.3 pg (ref 26.0–34.0)
MCHC: 32.1 g/dL (ref 30.0–36.0)
MCV: 94.3 fL (ref 80.0–100.0)
Monocytes Absolute: 0.7 10*3/uL (ref 0.1–1.0)
Monocytes Relative: 19 %
Neutro Abs: 1.7 10*3/uL (ref 1.7–7.7)
Neutrophils Relative %: 50 %
Platelets: 139 10*3/uL — ABNORMAL LOW (ref 150–400)
RBC: 3.17 MIL/uL — ABNORMAL LOW (ref 3.87–5.11)
RDW: 18.4 % — ABNORMAL HIGH (ref 11.5–15.5)
WBC: 3.4 10*3/uL — ABNORMAL LOW (ref 4.0–10.5)
nRBC: 0 % (ref 0.0–0.2)

## 2021-01-12 LAB — C-REACTIVE PROTEIN: CRP: 22.6 mg/dL — ABNORMAL HIGH (ref <1.0)

## 2021-01-12 LAB — PHOSPHORUS: Phosphorus: 5.5 mg/dL — ABNORMAL HIGH (ref 2.5–4.6)

## 2021-01-12 LAB — HEPATITIS B SURFACE ANTIBODY,QUALITATIVE: Hep B S Ab: REACTIVE — AB

## 2021-01-12 LAB — FERRITIN: Ferritin: 120 ng/mL (ref 11–307)

## 2021-01-12 LAB — CBG MONITORING, ED: Glucose-Capillary: 116 mg/dL — ABNORMAL HIGH (ref 70–99)

## 2021-01-12 LAB — MAGNESIUM: Magnesium: 2.2 mg/dL (ref 1.7–2.4)

## 2021-01-12 LAB — D-DIMER, QUANTITATIVE: D-Dimer, Quant: 2.31 ug/mL-FEU — ABNORMAL HIGH (ref 0.00–0.50)

## 2021-01-12 MED ORDER — SODIUM CHLORIDE 0.9 % IV SOLN
100.0000 mg | Freq: Every day | INTRAVENOUS | Status: DC
  Administered 2021-01-13: 100 mg via INTRAVENOUS
  Filled 2021-01-12 (×2): qty 20

## 2021-01-12 MED ORDER — ALBUTEROL SULFATE HFA 108 (90 BASE) MCG/ACT IN AERS
2.0000 | INHALATION_SPRAY | Freq: Four times a day (QID) | RESPIRATORY_TRACT | Status: DC | PRN
  Administered 2021-01-13: 2 via RESPIRATORY_TRACT
  Filled 2021-01-12: qty 6.7

## 2021-01-12 MED ORDER — DARBEPOETIN ALFA 100 MCG/0.5ML IJ SOSY: 100.0000 ug | PREFILLED_SYRINGE | INTRAMUSCULAR | Status: DC

## 2021-01-12 MED ORDER — DOXERCALCIFEROL 2.5 MCG PO CAPS
5.0000 ug | ORAL_CAPSULE | ORAL | Status: DC
  Administered 2021-01-14: 5 ug via ORAL
  Filled 2021-01-12 (×2): qty 2

## 2021-01-12 NOTE — ED Notes (Signed)
It was hot in the room an the patient was complaing she was sweating  found a fan for her

## 2021-01-12 NOTE — ED Notes (Signed)
Pt provided with sanitary napkin and disposable underwear

## 2021-01-12 NOTE — Progress Notes (Addendum)
Subjective =  seen in room , Daughter present , "feel better" tolerated 3 l uf , reported diarrhea improving  and no sob .  Objective Vital signs in last 24 hours: Vitals:   01/12/21 0745 01/12/21 0800 01/12/21 0930 01/12/21 1223  BP: (!) 154/62 133/60 (!) 157/103 (!) 148/68  Pulse: 70 78 80 67  Resp: (!) 26 17 20 18   Temp:  99.1 F (37.3 C) 99.4 F (37.4 C) 98.9 F (37.2 C)  TempSrc:  Oral Oral Oral  SpO2: 96% 99% 99% 95%  Weight:      Height:   5\' 5"  (1.651 m)    Weight change:   Physical exam.   General =alert  nad Female  Resp = no sob or audible wheezing   Card = pulse stable,Regular on telm.   Extrem= no noted edema   HD access= LUA  AVF no issues past HD   Dialysis Orders: Center: [facility] form TTS 3 hours 45 minutes EDW at 90 kg(noted last 3 treatments leaving below 88.6, 88.4, 88.5 kg) 2K, 2 CA bath, L UA aVF Heparin 6600  Mircera 100 last 01/02/21 Hectorol 5   Assessment/Plan Hyperkalemia missed dialysis X1= med tx and hd yest  with am k 4.8  ESRD = HD TTS , HD yest with isolation secondary to COVID, no hd needs today  Abdominal pain with nausea vomiting diarrhea= per admit Sepsis possibly secondary to COVID-positive= afeb this am , IV remdesivir started in ER, not hypoxic thus no steroids Hypertension/volume  -bp on admission, 186/90 ,improved with HD, CXR  Vol^  =3 l UF yest HD , O2 sat okay on room air, uses p.o. midodrine for BP on dialysis , Need stand wts , May need lower dry weight living below her outpatient/weight loss secondary to COVID possible Anemia  -Hgb 10.7 >9.7 dose ESA next week Metabolic bone disease -Hectorol  and binders   phos 5.5  corec ca ok on admission and binders needing 2 binders listed as OP  (fu Phos trend in [place]  to decr as needed ) Nutrition -ALB 3.5>2.8  nepro supplement   Ernest Haber, PA-C [facility] [phone] 01/12/2021,4:08 PM  LOS: 1 day   Labs: Basic Metabolic Panel: Recent Labs  Lab  01/10/21 2323 01/11/21 0327 01/11/21 0813 01/12/21 0730 01/12/21 1149  NA 135 134*  --  135  --   K 6.5* >7.5* 5.8* 4.8  --   CL 93* 93*  --  94*  --   CO2 23 25  --  27  --   GLUCOSE 82 76  --  114*  --   BUN 60* 61*  --  33*  --   CREATININE 18.54* 19.17*  --  11.65*  --   CALCIUM 9.8 9.7  --  8.8*  --   PHOS  --   --   --   --  5.5*   Liver Function Tests: Recent Labs  Lab 01/11/21 0327 01/12/21 0730  AST 10* 23  ALT 8 9  ALKPHOS 48 39  BILITOT 1.3* 1.2  PROT 7.2 6.7  ALBUMIN 3.5 2.8*   Recent Labs  Lab 01/11/21 0327  LIPASE 45   No results for input(s): AMMONIA in the last 168 hours. CBC: Recent Labs  Lab 01/10/21 2323 01/11/21 1820 01/12/21 1149  WBC 5.3 6.3 3.4*  NEUTROABS  --   --  1.7  HGB 10.7* 11.1* 9.6*  HCT 32.9* 34.2* 29.9*  MCV 93.7 93.4 94.3  PLT 167 166 139*   Cardiac Enzymes: No results for input(s): CKTOTAL, CKMB, CKMBINDEX, TROPONINI in the last 168 hours. CBG: Recent Labs  Lab 01/11/21 0819 01/11/21 1250 01/12/21 0752 01/12/21 1226  GLUCAP 106* 64* 116* 95    Medications:  [START ON 01/13/2021] remdesivir 100 mg in NS 100 mL      albuterol  2 puff Inhalation Q6H   calcium acetate  2,001 mg Oral TID WC   Chlorhexidine Gluconate Cloth  6 each Topical Q0600   cinacalcet  60 mg Oral QPM   heparin  5,000 Units Subcutaneous Q8H   oxyCODONE  10 mg Oral Once   pantoprazole  40 mg Oral Daily   sevelamer carbonate  1,600 mg Oral TID WC    Physical exam: unable to complete due to COVID + status.  In order to preserve PPE equipment and to minimize exposure to providers.  Notes from other caregivers reviewed  I have reviewed the medical records and agree with plan and assessment in the above note with renal recommendations/intervention highlighted.  Broadus John A Jameelah Watts,MD 01/12/2021 7:07 PM

## 2021-01-12 NOTE — Plan of Care (Signed)
Patient will maintain a pain level of less than 4/10 over next 24 hours

## 2021-01-12 NOTE — Progress Notes (Signed)
PROGRESS NOTE        PATIENT DETAILS Name: Desiree [name] Age: 45 y.o. Sex: female Date of Birth: [birth year] Admit Date: 01/10/2021 Admitting Physician Norval Morton, MD QJJ:[id number], Ardelia Mems, MD  Brief Narrative: Patient is a 45 y.o. female with history of ESRD on HD, HTN-presented with 2-day history of nausea, vomiting, diarrhea, abdominal pain-missed her last HD treatment-found to have COVID-19 infection and hypokalemia.  Subjective: Diarrhea much better-1 episode of vomiting this morning.  No shortness of breath.  Objective: Vitals: Blood pressure (!) 157/103, pulse 80, temperature 99.4 F (37.4 C), temperature source Oral, resp. rate 20, height 5\' 5"  (1.651 m), weight 95 kg, SpO2 99 %.   Exam: Gen Exam:Alert awake-not in any distress HEENT:atraumatic, normocephalic Chest: B/L clear to auscultation anteriorly CVS:S1S2 regular Abdomen:soft non tender, non distended Extremities:no edema Neurology: Non focal Skin: no rash  Pertinent Labs/Radiology: K: 4.8 Creatinine: 11.65  10/29>>Blood culture: No growth   10/29>> CT chest: Mild pulm edema-no consolidation 10/29>> CT abdomen/pelvis: No acute findings in the pelvis  Assessment/Plan: Sepsis due to COVID-19 infection: Sepsis physiology has improved-etiology felt to be COVID-19 infection-no other source of infection apparent.  Continue Remdesivir-stop vancomycin/cefepime.  Gastroenteritis: Suspect due to COVID-19 infection-continue supportive care-CT abdomen/pelvis negative for SBO/colitis.  Pulm edema: Due to missed HD-stable/improved after HD on 10/29-not on any oxygen this morning.  Hyperkalemia: Due to missed HD-resolved with HD on 10/29.  ESRD on HD: Nephrology following and directing care  Normocytic anemia: Due to ESRD-defer Aranesp/IV iron to nephrology.  Follow CBC.  Chronic pain syndrome: Continue oxycodone  Small boil beneath right breast: Supportive care for now-reassess  on 10/31  GERD: PPI  Large benign-appearing cyst on the left kidney: Seen incidentally on CT abdomen-stable for outpatient follow-up. Obesity: Estimated body mass index is 34.85 kg/m as calculated from the following:   Height as of this encounter: 5\' 5"  (1.651 m).   Weight as of this encounter: 95 kg.    Procedures: None Consults: None DVT Prophylaxis: Heparin Code Status:Full code Family Communication: None at bedside  Time spent: 35 minutes-Greater than 50% of this time was spent in counseling, explanation of diagnosis, planning of further management, and coordination of care.  Diet: Diet Order             Diet renal with fluid restriction Fluid restriction: 1200 mL Fluid; Room service appropriate? Yes; Fluid consistency: Thin  Diet effective now                      Disposition Plan: Status is: Inpatient  Remains inpatient appropriate because: Sepsis/COVID-19 infection-not yet stable for discharge-see above documentation.  Antimicrobial agents: Anti-infectives (From admission, onward)    Start     Dose/Rate Route Frequency Ordered Stop   01/12/21 2000  ceFEPIme (MAXIPIME) 1 g in sodium chloride 0.9 % 100 mL IVPB        1 g 200 mL/hr over 30 Minutes Intravenous Every 24 hours 01/11/21 0906     01/12/21 1000  remdesivir 100 mg in sodium chloride 0.9 % 100 mL IVPB       See Hyperspace for full Linked Orders Report.   100 mg 200 mL/hr over 30 Minutes Intravenous Daily 01/11/21 0551 01/16/21 0959   01/11/21 0905  vancomycin variable dose per unstable renal function (pharmacist dosing)  Does not apply See admin instructions 01/11/21 0905     01/11/21 0700  remdesivir 200 mg in sodium chloride 0.9% 250 mL IVPB       See Hyperspace for full Linked Orders Report.   200 mg 580 mL/hr over 30 Minutes Intravenous Once 01/11/21 0551 01/11/21 0801   01/11/21 0130  vancomycin (VANCOREADY) IVPB 2000 mg/400 mL        2,000 mg 200 mL/hr over 120 Minutes Intravenous   Once 01/11/21 0117 01/11/21 1041   01/11/21 0115  ceFEPIme (MAXIPIME) 2 g in sodium chloride 0.9 % 100 mL IVPB  Status:  Discontinued        2 g 200 mL/hr over 30 Minutes Intravenous  Once 01/11/21 0100 01/11/21 0113   01/11/21 0115  vancomycin (VANCOREADY) IVPB 2000 mg/400 mL  Status:  Discontinued        2,000 mg 200 mL/hr over 120 Minutes Intravenous  Once 01/11/21 0103 01/11/21 0113   01/11/21 0115  ceFEPIme (MAXIPIME) 2 g in sodium chloride 0.9 % 100 mL IVPB        2 g 200 mL/hr over 30 Minutes Intravenous  Once 01/11/21 0113 01/11/21 0444   01/11/21 0115  metroNIDAZOLE (FLAGYL) IVPB 500 mg        500 mg 100 mL/hr over 60 Minutes Intravenous  Once 01/11/21 0113 01/11/21 0445   01/11/21 0115  vancomycin (VANCOCIN) IVPB 1000 mg/200 mL premix  Status:  Discontinued        1,000 mg 200 mL/hr over 60 Minutes Intravenous  Once 01/11/21 0113 01/11/21 0116        MEDICATIONS: Scheduled Meds:  albuterol  2 puff Inhalation Q6H   calcium acetate  2,001 mg Oral TID WC   Chlorhexidine Gluconate Cloth  6 each Topical Q0600   cinacalcet  60 mg Oral QPM   heparin  5,000 Units Subcutaneous Q8H   oxyCODONE  10 mg Oral Once   pantoprazole  40 mg Oral Daily   sevelamer carbonate  1,600 mg Oral TID WC   vancomycin variable dose per unstable renal function (pharmacist dosing)   Does not apply See admin instructions   Continuous Infusions:  ceFEPime (MAXIPIME) IV     remdesivir 100 mg in NS 100 mL     PRN Meds:.acetaminophen, alprazolam, calcium acetate, dextrose, diphenhydrAMINE, guaiFENesin-dextromethorphan, HYDROmorphone (DILAUDID) injection, loperamide, ondansetron **OR** ondansetron (ZOFRAN) IV, oxyCODONE   I have personally reviewed following labs and imaging studies  LABORATORY DATA: CBC: Recent Labs  Lab 01/10/21 2323 01/11/21 1820  WBC 5.3 6.3  HGB 10.7* 11.1*  HCT 32.9* 34.2*  MCV 93.7 93.4  PLT 167 073    Basic Metabolic Panel: Recent Labs  Lab 01/10/21 2323  01/11/21 0327 01/11/21 0813 01/12/21 0730  NA 135 134*  --  135  K 6.5* >7.5* 5.8* 4.8  CL 93* 93*  --  94*  CO2 23 25  --  27  GLUCOSE 82 76  --  114*  BUN 60* 61*  --  33*  CREATININE 18.54* 19.17*  --  11.65*  CALCIUM 9.8 9.7  --  8.8*    GFR: Estimated Creatinine Clearance: 7 mL/min (A) (by C-G formula based on SCr of 11.65 mg/dL (H)).  Liver Function Tests: Recent Labs  Lab 01/11/21 0327 01/12/21 0730  AST 10* 23  ALT 8 9  ALKPHOS 48 39  BILITOT 1.3* 1.2  PROT 7.2 6.7  ALBUMIN 3.5 2.8*   Recent Labs  Lab 01/11/21 0327  LIPASE 45  No results for input(s): AMMONIA in the last 168 hours.  Coagulation Profile: Recent Labs  Lab 01/10/21 2323  INR 1.2    Cardiac Enzymes: No results for input(s): CKTOTAL, CKMB, CKMBINDEX, TROPONINI in the last 168 hours.  BNP (last 3 results) No results for input(s): PROBNP in the last 8760 hours.  Lipid Profile: No results for input(s): CHOL, HDL, LDLCALC, TRIG, CHOLHDL, LDLDIRECT in the last 72 hours.  Thyroid Function Tests: No results for input(s): TSH, T4TOTAL, FREET4, T3FREE, THYROIDAB in the last 72 hours.  Anemia Panel: Recent Labs    01/11/21 0813  FERRITIN 91    Urine analysis:    Component Value Date/Time   COLORURINE YELLOW 12/20/2013 0530   APPEARANCEUR CLOUDY (A) 12/20/2013 0530   LABSPEC 1.009 12/20/2013 0530   PHURINE 8.0 12/20/2013 0530   GLUCOSEU 100 (A) 12/20/2013 0530   HGBUR LARGE (A) 12/20/2013 0530   BILIRUBINUR NEGATIVE 12/20/2013 0530   KETONESUR NEGATIVE 12/20/2013 0530   PROTEINUR 100 (A) 12/20/2013 0530   UROBILINOGEN 0.2 12/20/2013 0530   NITRITE NEGATIVE 12/20/2013 0530   LEUKOCYTESUR TRACE (A) 12/20/2013 0530    Sepsis Labs: Lactic Acid, Venous    Component Value Date/Time   LATICACIDVEN 3.0 ([place]) 01/11/2021 0514    MICROBIOLOGY: Recent Results (from the past 240 hour(s))  Resp Panel by RT-PCR (Flu A&B, Covid) Nasopharyngeal Swab     Status: Abnormal   Collection  Time: 01/11/21 12:55 AM   Specimen: Nasopharyngeal Swab; Nasopharyngeal(NP) swabs in vial transport medium  Result Value Ref Range Status   SARS Coronavirus 2 by RT PCR POSITIVE (A) NEGATIVE Final    Comment: RESULT CALLED TO, READ BACK BY AND VERIFIED WITH: K WILLIAMS,RN@0522  01/11/21 [place] (NOTE) SARS-CoV-2 target nucleic acids are DETECTED.  The SARS-CoV-2 RNA is generally detectable in upper respiratory specimens during the acute phase of infection. Positive results are indicative of the presence of the identified virus, but do not rule out bacterial infection or co-infection with other pathogens not detected by the test. Clinical correlation with patient history and other diagnostic information is necessary to determine patient infection status. The expected result is Negative.  Fact Sheet for Patients: EntrepreneurPulse.com.au  Fact Sheet for Healthcare Providers: IncredibleEmployment.be  This test is not yet approved or cleared by the [place] FDA and  has been authorized for detection and/or diagnosis of SARS-CoV-2 by FDA under an Emergency Use Authorization (EUA).  This EUA will remain in effect (meaning this test can be [place] ed) for the duration of  the COVID-19 declaration under Section 564(b)(1) of the Act, 21 U.S.C. section 360bbb-3(b)(1), unless the authorization is terminated or revoked sooner.     Influenza A by PCR NEGATIVE NEGATIVE Final   Influenza B by PCR NEGATIVE NEGATIVE Final    Comment: (NOTE) The Xpert Xpress SARS-CoV-2/FLU/RSV plus assay is intended as an aid in the diagnosis of influenza from Nasopharyngeal swab specimens and should not be used as a sole basis for treatment. Nasal washings and aspirates are unacceptable for Xpert Xpress SARS-CoV-2/FLU/RSV testing.  Fact Sheet for Patients: EntrepreneurPulse.com.au  Fact Sheet for Healthcare  Providers: IncredibleEmployment.be  This test is not yet approved or cleared by the [place] FDA and has been authorized for detection and/or diagnosis of SARS-CoV-2 by FDA under an Emergency Use Authorization (EUA). This EUA will remain in effect (meaning this test can be used) for the duration of the COVID-19 declaration under Section 564(b)(1) of the Act, 21 U.S.C. section 360bbb-3(b)(1), unless the authorization is terminated  or revoked.  Performed at [facility], [place] [address] [place], [place] [address]   Blood culture (routine x 2)     Status: None (Preliminary result)   Collection Time: 01/11/21  2:35 AM   Specimen: BLOOD RIGHT FOREARM  Result Value Ref Range Status   Specimen Description BLOOD RIGHT FOREARM  Final   Special Requests   Final    BOTTLES DRAWN AEROBIC AND ANAEROBIC Blood Culture results may not be optimal due to an inadequate volume of blood received in culture bottles   Culture   Final    NO GROWTH 1 DAY Performed at [facility], [place] [address] [place], [place] [address]    Report Status PENDING  Incomplete    RADIOLOGY STUDIES/RESULTS: DG Chest 2 View  Result Date: 01/11/2021 CLINICAL DATA:  Initial evaluation for acute chest pain. EXAM: CHEST - 2 VIEW COMPARISON:  Radiograph from 06/12/2019. FINDINGS: Mild cardiomegaly.  Mediastinal silhouette within normal limits. Lungs mildly hypoinflated. Mild perihilar and interstitial congestion without frank pulmonary edema. No visible pleural effusion. No consolidative airspace disease. No pneumothorax. Visualized osseous structures within normal limits. IMPRESSION: 1. Cardiomegaly with mild perihilar and interstitial congestion without frank pulmonary edema. 2. No other active cardiopulmonary disease. Electronically Signed   By: Jeannine Boga M.D.   On: 01/11/2021 00:39   DG Chest Port 1V same Day  Result Date: 01/12/2021 CLINICAL DATA:  Chest pain, short of breath  and wheezing EXAM: PORTABLE CHEST 1 VIEW COMPARISON:  01/11/2021 FINDINGS: Normal cardiac silhouette.  No effusion, infiltrate or pneumothorax. Fine airspace disease again noted. IMPRESSION: Mild pulmonary edema pattern. Electronically Signed   By: Suzy Bouchard M.D.   On: 01/12/2021 07:43   CT CHEST ABDOMEN PELVIS WO CONTRAST  Result Date: 01/11/2021 CLINICAL DATA:  Abdominal pain and fever. Emesis and diarrhea. Short of breath. Dialysis patient. Positive COVID EXAM: CT CHEST, ABDOMEN AND PELVIS WITHOUT CONTRAST TECHNIQUE: Multidetector CT imaging of the chest, abdomen and pelvis was performed following the standard protocol without IV contrast. COMPARISON:  CT 05/02/2018 FINDINGS: CT CHEST FINDINGS Cardiovascular: No significant vascular findings. Normal heart size. No pericardial effusion. Atherosclerotic calcification of the aorta. Mediastinum/Nodes: No axillary or supraclavicular adenopathy. No mediastinal or hilar adenopathy. No pericardial fluid. Esophagus normal. Lungs/Pleura: Mild ground-glass opacities in the lungs in a diffuse pattern. For example superior segment of the RIGHT lower lobe (image 56/5). No focal consolidation. No pleural fluid. No pneumothorax. Airways normal Musculoskeletal: No aggressive osseous lesion. CT ABDOMEN AND PELVIS FINDINGS Hepatobiliary: No focal hepatic lesion. No biliary ductal dilatation. Gallbladder is normal. Common bile duct is normal. Pancreas: No pancreatic inflammation. Spleen: Normal spleen Adrenals/urinary tract: Adrenal glands are normal. The ureters and bladder normal. Large periphery calcified cystic mass emanating from the lower pole of the LEFT kidney has uniform central simple fluid attenuation. No change from prior. Cyst measures up to 9 cm. Kidneys are atrophic. Bladder collapsed Stomach/Bowel: Stomach, small bowel, appendix, and cecum are normal. The colon and rectosigmoid colon are normal. Vascular/Lymphatic: Abdominal aorta is normal caliber with  atherosclerotic calcification. There is no retroperitoneal or periportal lymphadenopathy. No pelvic lymphadenopathy. Reproductive: Uterus and adnexa unremarkable. Other: Is Musculoskeletal: No aggressive osseous lesion. IMPRESSION: Chest Impression: 1. Mild pulmonary edema pattern in the lungs. 2. No focal consolidation Abdomen / Pelvis Impression: 1. No acute findings abdomen pelvis. 2. Large benign appearing cyst of the LEFT kidney. 3. Bilateral atrophic kidneys. 4.  Aortic Atherosclerosis (ICD10-I70.0). Electronically Signed   By: Nicole Kindred  Leonia Reeves M.D.   On: 01/11/2021 06:05     LOS: 1 day   Oren Binet, MD  [facility]    To contact the attending provider between 7A-7P or the covering provider during after hours 7P-7A, please log into the web site [email or web address] and access using universal [place] password for that web site. If you do not have the password, please call the hospital operator.  01/12/2021, 11:05 AM

## 2021-01-13 ENCOUNTER — Inpatient Hospital Stay (HOSPITAL_COMMUNITY): Payer: Medicare Other

## 2021-01-13 LAB — COMPREHENSIVE METABOLIC PANEL
ALT: 8 U/L (ref 0–44)
AST: 12 U/L — ABNORMAL LOW (ref 15–41)
Albumin: 2.8 g/dL — ABNORMAL LOW (ref 3.5–5.0)
Alkaline Phosphatase: 38 U/L (ref 38–126)
Anion gap: 15 (ref 5–15)
BUN: 46 mg/dL — ABNORMAL HIGH (ref 6–20)
CO2: 26 mmol/L (ref 22–32)
Calcium: 8.8 mg/dL — ABNORMAL LOW (ref 8.9–10.3)
Chloride: 93 mmol/L — ABNORMAL LOW (ref 98–111)
Creatinine, Ser: 13.38 mg/dL — ABNORMAL HIGH (ref 0.44–1.00)
GFR, Estimated: 3 mL/min — ABNORMAL LOW (ref >60)
Glucose, Bld: 91 mg/dL (ref 70–99)
Potassium: 4.4 mmol/L (ref 3.5–5.1)
Sodium: 134 mmol/L — ABNORMAL LOW (ref 135–145)
Total Bilirubin: 0.7 mg/dL (ref 0.3–1.2)
Total Protein: 6.8 g/dL (ref 6.5–8.1)

## 2021-01-13 LAB — CBC WITH DIFFERENTIAL/PLATELET
Abs Immature Granulocytes: 0.01 10*3/uL (ref 0.00–0.07)
Basophils Absolute: 0 10*3/uL (ref 0.0–0.1)
Basophils Relative: 1 %
Eosinophils Absolute: 0.4 10*3/uL (ref 0.0–0.5)
Eosinophils Relative: 11 %
HCT: 29.7 % — ABNORMAL LOW (ref 36.0–46.0)
Hemoglobin: 9.5 g/dL — ABNORMAL LOW (ref 12.0–15.0)
Immature Granulocytes: 0 %
Lymphocytes Relative: 33 %
Lymphs Abs: 1.3 10*3/uL (ref 0.7–4.0)
MCH: 30.1 pg (ref 26.0–34.0)
MCHC: 32 g/dL (ref 30.0–36.0)
MCV: 94 fL (ref 80.0–100.0)
Monocytes Absolute: 0.6 10*3/uL (ref 0.1–1.0)
Monocytes Relative: 15 %
Neutro Abs: 1.6 10*3/uL — ABNORMAL LOW (ref 1.7–7.7)
Neutrophils Relative %: 40 %
Platelets: 156 10*3/uL (ref 150–400)
RBC: 3.16 MIL/uL — ABNORMAL LOW (ref 3.87–5.11)
RDW: 18.2 % — ABNORMAL HIGH (ref 11.5–15.5)
WBC: 4 10*3/uL (ref 4.0–10.5)
nRBC: 0 % (ref 0.0–0.2)

## 2021-01-13 LAB — D-DIMER, QUANTITATIVE: D-Dimer, Quant: 1.95 ug/mL-FEU — ABNORMAL HIGH (ref 0.00–0.50)

## 2021-01-13 LAB — PHOSPHORUS: Phosphorus: 5.5 mg/dL — ABNORMAL HIGH (ref 2.5–4.6)

## 2021-01-13 LAB — C-REACTIVE PROTEIN: CRP: 20.2 mg/dL — ABNORMAL HIGH (ref <1.0)

## 2021-01-13 LAB — FERRITIN: Ferritin: 121 ng/mL (ref 11–307)

## 2021-01-13 LAB — MAGNESIUM: Magnesium: 2.2 mg/dL (ref 1.7–2.4)

## 2021-01-13 LAB — GLUCOSE, CAPILLARY: Glucose-Capillary: 96 mg/dL (ref 70–99)

## 2021-01-13 NOTE — Progress Notes (Signed)
Pt receives out-pt HD at [facility] on TTS. Pt arrives at 6:25 for 6:45 chair time. Clinic manager advised of covid diagnosis. Clinic will place pt in isolation room for treatments upon d/c until 14 days have been completed. Will follow and assist.   Melven Sartorius Renal Navigator [phone]

## 2021-01-13 NOTE — Progress Notes (Signed)
[facility] Progress Note   Subjective: Seen in room. Feeling better but asking for IV pain meds as PO is making her vomit. Denies SOB.   Objective Vitals:   01/12/21 2012 01/12/21 2325 01/13/21 0335 01/13/21 0801  BP: 130/62 (!) 152/68 133/65 126/61  Pulse: 70 75 70 72  Resp: 18 17 18 17   Temp: 98.9 F (37.2 C) 99 F (37.2 C) 98.9 F (37.2 C) 98.4 F (36.9 C)  TempSrc: Oral Oral Axillary Oral  SpO2: 98% 98% 96% 99%  Weight:      Height:       Physical Exam General: Pleasant, WN, WD female in NAD Heart: S1,S2, RRR no M/R/G Lungs: CTAB. No SOB.  Abdomen:NABS, NT, ND Extremities: No LE edema Dialysis Access: L AVF + T/B   Additional Objective Labs: Basic Metabolic Panel: Recent Labs  Lab 01/11/21 0327 01/11/21 0813 01/12/21 0730 01/12/21 1149 01/13/21 0112  NA 134*  --  135  --  134*  K >7.5* 5.8* 4.8  --  4.4  CL 93*  --  94*  --  93*  CO2 25  --  27  --  26  GLUCOSE 76  --  114*  --  91  BUN 61*  --  33*  --  46*  CREATININE 19.17*  --  11.65*  --  13.38*  CALCIUM 9.7  --  8.8*  --  8.8*  PHOS  --   --   --  5.5* 5.5*   Liver Function Tests: Recent Labs  Lab 01/11/21 0327 01/12/21 0730 01/13/21 0112  AST 10* 23 12*  ALT 8 9 8   ALKPHOS 48 39 38  BILITOT 1.3* 1.2 0.7  PROT 7.2 6.7 6.8  ALBUMIN 3.5 2.8* 2.8*   Recent Labs  Lab 01/11/21 0327  LIPASE 45   CBC: Recent Labs  Lab 01/10/21 2323 01/11/21 1820 01/12/21 1149 01/13/21 0112  WBC 5.3 6.3 3.4* 4.0  NEUTROABS  --   --  1.7 1.6*  HGB 10.7* 11.1* 9.6* 9.5*  HCT 32.9* 34.2* 29.9* 29.7*  MCV 93.7 93.4 94.3 94.0  PLT 167 166 139* 156   Blood Culture    Component Value Date/Time   SDES BLOOD RIGHT FOREARM 01/11/2021 0235   SPECREQUEST  01/11/2021 0235    BOTTLES DRAWN AEROBIC AND ANAEROBIC Blood Culture results may not be optimal due to an inadequate volume of blood received in culture bottles   CULT  01/11/2021 0235    NO GROWTH 2 DAYS Performed at [facility], [place] [address] [place], [place] [address]    REPTSTATUS PENDING 01/11/2021 0235    Cardiac Enzymes: No results for input(s): CKTOTAL, CKMB, CKMBINDEX, TROPONINI in the last 168 hours. CBG: Recent Labs  Lab 01/12/21 0752 01/12/21 1226 01/12/21 1722 01/12/21 2011 01/13/21 0804  GLUCAP 116* 95 104* 108* 96   Iron Studies:  Recent Labs    01/13/21 0112  FERRITIN 121   @[email or web address] @ Studies/Results: DG Chest Port 1V same Day  Result Date: 01/12/2021 CLINICAL DATA:  Chest pain, short of breath and wheezing EXAM: PORTABLE CHEST 1 VIEW COMPARISON:  01/11/2021 FINDINGS: Normal cardiac silhouette.  No effusion, infiltrate or pneumothorax. Fine airspace disease again noted. IMPRESSION: Mild pulmonary edema pattern. Electronically Signed   By: Suzy Bouchard M.D.   On: 01/12/2021 07:43   Medications:  remdesivir 100 mg in NS 100 mL 100 mg (01/13/21 0951)    calcium acetate  2,001 mg Oral TID WC  Chlorhexidine Gluconate Cloth  6 each Topical Q0600   cinacalcet  60 mg Oral QPM   [START ON 01/16/2021] darbepoetin (ARANESP) injection - DIALYSIS  100 mcg Intravenous Q Thu-HD   [START ON 01/14/2021] doxercalciferol  5 mcg Oral Q T,Th,Sa-HD   heparin  5,000 Units Subcutaneous Q8H   oxyCODONE  10 mg Oral Once   pantoprazole  40 mg Oral Daily   sevelamer carbonate  1,600 mg Oral TID WC     Dialysis Orders: Center: [facility] T,Th,S  3:45 hrs 180NRe 400/500 90 kg 2.0K/2.0 Ca AVF -Heparin 6600 units IV TIW -Mircera 100 mcg IV q 2 weeks (last dose 01/02/2021   Assessment/Plan Hyperkalemia in setting of missed HD. Resolved.  ESRD-T,Th, S Next HD 01/14/2021 Gastroenteritis: Per primary Sepsis D/T Covid 19- IV remdesivir started in ER, not hypoxic thus no steroids. Per primary.  Hypertension/volume-BP historically labile. Volume overload on adm resolved with HD. Has been losing weight, leaving under EDW. Lower EDW on discharge.  Anemia  -HGB 9.5. ESA due 01/17/2021. Will order.   Metabolic bone disease -continue binders/VDRA Nutrition -ALB low. Renal Diet, protein supps.   Belky Mundo H. Zalyn Amend NP-C 01/13/2021, 12:36 PM  [employer] [phone]

## 2021-01-13 NOTE — Progress Notes (Signed)
PROGRESS NOTE        PATIENT DETAILS Name: Desiree [name] Age: 45 y.o. Sex: female Date of Birth: [birth year] Admit Date: 01/10/2021 Admitting Physician Norval Morton, MD TRZ:NBVAPOLI, Ardelia Mems, MD  Brief Narrative: Patient is a 45 y.o. female with history of ESRD on HD, HTN-presented with 2-day history of nausea, vomiting, diarrhea, abdominal pain-missed her last HD treatment-found to have COVID-19 infection and hypokalemia.  Subjective: Some mild nausea but no vomiting and diarrhea since yesterday.  Overall feels better.  Objective: Vitals: Blood pressure 126/61, pulse 72, temperature 98.4 F (36.9 C), temperature source Oral, resp. rate 17, height 5\' 5"  (1.651 m), weight 95 kg, SpO2 99 %.   Exam: Gen Exam:Alert awake-not in any distress HEENT:atraumatic, normocephalic Chest: B/L clear to auscultation anteriorly CVS:S1S2 regular Abdomen:soft non tender, non distended Extremities:no edema Neurology: Non focal Skin: no rash   Pertinent Labs/Radiology: K: 4.8 Creatinine: 11.65  10/29>>Blood culture: No growth   10/29>> CT chest: Mild pulm edema-no consolidation 10/29>> CT abdomen/pelvis: No acute findings in the pelvis  Assessment/Plan: Sepsis due to COVID-19 infection: Sepsis physiology has improved-GI symptoms are better.  Blood culture continues to be negative.  On IV Remdesivir x3 days.  Continue to follow clinical course.  Gastroenteritis: Suspect due to COVID-19 infection-GI symptoms have improved-some nausea persists.  Pulm edema: Due to missed HD-stable/improved after HD on 10/29-not on any oxygen this morning.  Hyperkalemia: Due to missed HD-resolved with HD on 10/29.  ESRD on HD: Nephrology following and directing care  Normocytic anemia: Due to ESRD-defer Aranesp/IV iron to nephrology.  Follow CBC.  Chronic pain syndrome: Continue oxycodone  Small boil beneath right breast: Continue supportive care.  Left leg pain: Obtain  Dopplers-do not see any major swelling.  GERD: PPI  Large benign-appearing cyst on the left kidney: Seen incidentally on CT abdomen-stable for outpatient follow-up.  Obesity: Estimated body mass index is 34.85 kg/m as calculated from the following:   Height as of this encounter: 5\' 5"  (1.651 m).   Weight as of this encounter: 95 kg.    Procedures: None Consults: None DVT Prophylaxis: Heparin Code Status:Full code Family Communication: None at bedside  Time spent: 25 minutes-Greater than 50% of this time was spent in counseling, explanation of diagnosis, planning of further management, and coordination of care.  Diet: Diet Order             Diet renal with fluid restriction Fluid restriction: 1200 mL Fluid; Room service appropriate? Yes; Fluid consistency: Thin  Diet effective now                      Disposition Plan: Status is: Inpatient  Remains inpatient appropriate because: Awaiting sepsis physiology-continue Remdesivir x3 days-obtain Doppler of lower extremity rule out DVT.  If clinical improvement continues-possible discharge home on 11/1  Antimicrobial agents: Anti-infectives (From admission, onward)    Start     Dose/Rate Route Frequency Ordered Stop   01/13/21 1000  remdesivir 100 mg in sodium chloride 0.9 % 100 mL IVPB       See Hyperspace for full Linked Orders Report.   100 mg 200 mL/hr over 30 Minutes Intravenous Daily 01/12/21 1110 01/15/21 0959   01/12/21 2000  ceFEPIme (MAXIPIME) 1 g in sodium chloride 0.9 % 100 mL IVPB  Status:  Discontinued  1 g 200 mL/hr over 30 Minutes Intravenous Every 24 hours 01/11/21 0906 01/12/21 1110   01/12/21 1000  remdesivir 100 mg in sodium chloride 0.9 % 100 mL IVPB  Status:  Discontinued       See Hyperspace for full Linked Orders Report.   100 mg 200 mL/hr over 30 Minutes Intravenous Daily 01/11/21 0551 01/12/21 1110   01/11/21 0905  vancomycin variable dose per unstable renal function (pharmacist dosing)   Status:  Discontinued         Does not apply See admin instructions 01/11/21 0905 01/12/21 1110   01/11/21 0700  remdesivir 200 mg in sodium chloride 0.9% 250 mL IVPB       See Hyperspace for full Linked Orders Report.   200 mg 580 mL/hr over 30 Minutes Intravenous Once 01/11/21 0551 01/11/21 0801   01/11/21 0130  vancomycin (VANCOREADY) IVPB 2000 mg/400 mL        2,000 mg 200 mL/hr over 120 Minutes Intravenous  Once 01/11/21 0117 01/11/21 1041   01/11/21 0115  ceFEPIme (MAXIPIME) 2 g in sodium chloride 0.9 % 100 mL IVPB  Status:  Discontinued        2 g 200 mL/hr over 30 Minutes Intravenous  Once 01/11/21 0100 01/11/21 0113   01/11/21 0115  vancomycin (VANCOREADY) IVPB 2000 mg/400 mL  Status:  Discontinued        2,000 mg 200 mL/hr over 120 Minutes Intravenous  Once 01/11/21 0103 01/11/21 0113   01/11/21 0115  ceFEPIme (MAXIPIME) 2 g in sodium chloride 0.9 % 100 mL IVPB        2 g 200 mL/hr over 30 Minutes Intravenous  Once 01/11/21 0113 01/11/21 0444   01/11/21 0115  metroNIDAZOLE (FLAGYL) IVPB 500 mg        500 mg 100 mL/hr over 60 Minutes Intravenous  Once 01/11/21 0113 01/11/21 0445   01/11/21 0115  vancomycin (VANCOCIN) IVPB 1000 mg/200 mL premix  Status:  Discontinued        1,000 mg 200 mL/hr over 60 Minutes Intravenous  Once 01/11/21 0113 01/11/21 0116        MEDICATIONS: Scheduled Meds:  calcium acetate  2,001 mg Oral TID WC   Chlorhexidine Gluconate Cloth  6 each Topical Q0600   cinacalcet  60 mg Oral QPM   [START ON 01/16/2021] darbepoetin (ARANESP) injection - DIALYSIS  100 mcg Intravenous Q Thu-HD   [START ON 01/14/2021] doxercalciferol  5 mcg Oral Q T,Th,Sa-HD   heparin  5,000 Units Subcutaneous Q8H   oxyCODONE  10 mg Oral Once   pantoprazole  40 mg Oral Daily   sevelamer carbonate  1,600 mg Oral TID WC   Continuous Infusions:  remdesivir 100 mg in NS 100 mL 100 mg (01/13/21 0951)   PRN Meds:.acetaminophen, albuterol, alprazolam, calcium acetate, dextrose,  diphenhydrAMINE, guaiFENesin-dextromethorphan, loperamide, ondansetron **OR** ondansetron (ZOFRAN) IV, oxyCODONE   I have personally reviewed following labs and imaging studies  LABORATORY DATA: CBC: Recent Labs  Lab 01/10/21 2323 01/11/21 1820 01/12/21 1149 01/13/21 0112  WBC 5.3 6.3 3.4* 4.0  NEUTROABS  --   --  1.7 1.6*  HGB 10.7* 11.1* 9.6* 9.5*  HCT 32.9* 34.2* 29.9* 29.7*  MCV 93.7 93.4 94.3 94.0  PLT 167 166 139* 156     Basic Metabolic Panel: Recent Labs  Lab 01/10/21 2323 01/11/21 0327 01/11/21 0813 01/12/21 0730 01/12/21 1149 01/13/21 0112  NA 135 134*  --  135  --  134*  K 6.5* >7.5* 5.8*  4.8  --  4.4  CL 93* 93*  --  94*  --  93*  CO2 23 25  --  27  --  26  GLUCOSE 82 76  --  114*  --  91  BUN 60* 61*  --  33*  --  46*  CREATININE 18.54* 19.17*  --  11.65*  --  13.38*  CALCIUM 9.8 9.7  --  8.8*  --  8.8*  MG  --   --   --   --  2.2 2.2  PHOS  --   --   --   --  5.5* 5.5*     GFR: Estimated Creatinine Clearance: 6.1 mL/min (A) (by C-G formula based on SCr of 13.38 mg/dL (H)).  Liver Function Tests: Recent Labs  Lab 01/11/21 0327 01/12/21 0730 01/13/21 0112  AST 10* 23 12*  ALT 8 9 8   ALKPHOS 48 39 38  BILITOT 1.3* 1.2 0.7  PROT 7.2 6.7 6.8  ALBUMIN 3.5 2.8* 2.8*    Recent Labs  Lab 01/11/21 0327  LIPASE 45    No results for input(s): AMMONIA in the last 168 hours.  Coagulation Profile: Recent Labs  Lab 01/10/21 2323  INR 1.2     Cardiac Enzymes: No results for input(s): CKTOTAL, CKMB, CKMBINDEX, TROPONINI in the last 168 hours.  BNP (last 3 results) No results for input(s): PROBNP in the last 8760 hours.  Lipid Profile: No results for input(s): CHOL, HDL, LDLCALC, TRIG, CHOLHDL, LDLDIRECT in the last 72 hours.  Thyroid Function Tests: No results for input(s): TSH, T4TOTAL, FREET4, T3FREE, THYROIDAB in the last 72 hours.  Anemia Panel: Recent Labs    01/12/21 1149 01/13/21 0112  FERRITIN 120 121     Urine  analysis:    Component Value Date/Time   COLORURINE YELLOW 12/20/2013 0530   APPEARANCEUR CLOUDY (A) 12/20/2013 0530   LABSPEC 1.009 12/20/2013 0530   PHURINE 8.0 12/20/2013 0530   GLUCOSEU 100 (A) 12/20/2013 0530   HGBUR LARGE (A) 12/20/2013 0530   BILIRUBINUR NEGATIVE 12/20/2013 0530   KETONESUR NEGATIVE 12/20/2013 0530   PROTEINUR 100 (A) 12/20/2013 0530   UROBILINOGEN 0.2 12/20/2013 0530   NITRITE NEGATIVE 12/20/2013 0530   LEUKOCYTESUR TRACE (A) 12/20/2013 0530    Sepsis Labs: Lactic Acid, Venous    Component Value Date/Time   LATICACIDVEN 3.0 ([place]) 01/11/2021 0514    MICROBIOLOGY: Recent Results (from the past 240 hour(s))  Resp Panel by RT-PCR (Flu A&B, Covid) Nasopharyngeal Swab     Status: Abnormal   Collection Time: 01/11/21 12:55 AM   Specimen: Nasopharyngeal Swab; Nasopharyngeal(NP) swabs in vial transport medium  Result Value Ref Range Status   SARS Coronavirus 2 by RT PCR POSITIVE (A) NEGATIVE Final    Comment: RESULT CALLED TO, READ BACK BY AND VERIFIED WITH: K WILLIAMS,RN@[id number]  01/11/21 [place] (NOTE) SARS-CoV-2 target nucleic acids are DETECTED.  The SARS-CoV-2 RNA is generally detectable in upper respiratory specimens during the acute phase of infection. Positive results are indicative of the presence of the identified virus, but do not rule out bacterial infection or co-infection with other pathogens not detected by the test. Clinical correlation with patient history and other diagnostic information is necessary to determine patient infection status. The expected result is Negative.  Fact Sheet for Patients: EntrepreneurPulse.com.au  Fact Sheet for Healthcare Providers: IncredibleEmployment.be  This test is not yet approved or cleared by the [place] FDA and  has been authorized for detection and/or diagnosis of SARS-CoV-2 by  FDA under an Emergency Use Authorization (EUA).  This EUA will remain in effect (meaning  this test can be [place] ed) for the duration of  the COVID-19 declaration under Section 564(b)(1) of the Act, 21 U.S.C. section 360bbb-3(b)(1), unless the authorization is terminated or revoked sooner.     Influenza A by PCR NEGATIVE NEGATIVE Final   Influenza B by PCR NEGATIVE NEGATIVE Final    Comment: (NOTE) The Xpert Xpress SARS-CoV-2/FLU/RSV plus assay is intended as an aid in the diagnosis of influenza from Nasopharyngeal swab specimens and should not be used as a sole basis for treatment. Nasal washings and aspirates are unacceptable for Xpert Xpress SARS-CoV-2/FLU/RSV testing.  Fact Sheet for Patients: EntrepreneurPulse.com.au  Fact Sheet for Healthcare Providers: IncredibleEmployment.be  This test is not yet approved or cleared by the [place] FDA and has been authorized for detection and/or diagnosis of SARS-CoV-2 by FDA under an Emergency Use Authorization (EUA). This EUA will remain in effect (meaning this test can be used) for the duration of the COVID-19 declaration under Section 564(b)(1) of the Act, 21 U.S.C. section 360bbb-3(b)(1), unless the authorization is terminated or revoked.  Performed at [facility], [place] [address], [place], [place] [address]   Blood culture (routine x 2)     Status: None (Preliminary result)   Collection Time: 01/11/21  2:35 AM   Specimen: BLOOD RIGHT FOREARM  Result Value Ref Range Status   Specimen Description BLOOD RIGHT FOREARM  Final   Special Requests   Final    BOTTLES DRAWN AEROBIC AND ANAEROBIC Blood Culture results may not be optimal due to an inadequate volume of blood received in culture bottles   Culture   Final    NO GROWTH 2 DAYS Performed at [facility], [place] [address], [place], [place] [address]    Report Status PENDING  Incomplete    RADIOLOGY STUDIES/RESULTS: DG Chest Port 1V same Day  Result Date: 01/12/2021 CLINICAL DATA:  Chest pain, short of breath and  wheezing EXAM: PORTABLE CHEST 1 VIEW COMPARISON:  01/11/2021 FINDINGS: Normal cardiac silhouette.  No effusion, infiltrate or pneumothorax. Fine airspace disease again noted. IMPRESSION: Mild pulmonary edema pattern. Electronically Signed   By: Suzy Bouchard M.D.   On: 01/12/2021 07:43     LOS: 2 days   Oren Binet, MD  [facility]    To contact the attending provider between 7A-7P or the covering provider during after hours 7P-7A, please log into the web site [email or web address] and access using universal [place] password for that web site. If you do not have the password, please call the hospital operator.  01/13/2021, 2:20 PM

## 2021-01-14 ENCOUNTER — Encounter (HOSPITAL_COMMUNITY): Payer: Medicare Other

## 2021-01-14 ENCOUNTER — Inpatient Hospital Stay (HOSPITAL_COMMUNITY): Payer: Medicare Other

## 2021-01-14 DIAGNOSIS — M79604 Pain in right leg: Secondary | ICD-10-CM

## 2021-01-14 DIAGNOSIS — R7989 Other specified abnormal findings of blood chemistry: Secondary | ICD-10-CM

## 2021-01-14 DIAGNOSIS — M79605 Pain in left leg: Secondary | ICD-10-CM

## 2021-01-14 DIAGNOSIS — U071 COVID-19: Secondary | ICD-10-CM

## 2021-01-14 LAB — CBC WITH DIFFERENTIAL/PLATELET
Abs Immature Granulocytes: 0 10*3/uL (ref 0.00–0.07)
Basophils Absolute: 0 10*3/uL (ref 0.0–0.1)
Basophils Relative: 1 %
Eosinophils Absolute: 0.3 10*3/uL (ref 0.0–0.5)
Eosinophils Relative: 9 %
HCT: 28 % — ABNORMAL LOW (ref 36.0–46.0)
Hemoglobin: 8.9 g/dL — ABNORMAL LOW (ref 12.0–15.0)
Lymphocytes Relative: 39 %
Lymphs Abs: 1.2 10*3/uL (ref 0.7–4.0)
MCH: 30 pg (ref 26.0–34.0)
MCHC: 31.8 g/dL (ref 30.0–36.0)
MCV: 94.3 fL (ref 80.0–100.0)
Monocytes Absolute: 0.3 10*3/uL (ref 0.1–1.0)
Monocytes Relative: 9 %
Neutro Abs: 1.3 10*3/uL — ABNORMAL LOW (ref 1.7–7.7)
Neutrophils Relative %: 42 %
Platelets: 154 10*3/uL (ref 150–400)
RBC: 2.97 MIL/uL — ABNORMAL LOW (ref 3.87–5.11)
RDW: 18 % — ABNORMAL HIGH (ref 11.5–15.5)
WBC: 3 10*3/uL — ABNORMAL LOW (ref 4.0–10.5)
nRBC: 0 % (ref 0.0–0.2)
nRBC: 0 /100 WBC

## 2021-01-14 LAB — COMPREHENSIVE METABOLIC PANEL
ALT: 8 U/L (ref 0–44)
AST: 12 U/L — ABNORMAL LOW (ref 15–41)
Albumin: 2.7 g/dL — ABNORMAL LOW (ref 3.5–5.0)
Alkaline Phosphatase: 34 U/L — ABNORMAL LOW (ref 38–126)
Anion gap: 14 (ref 5–15)
BUN: 63 mg/dL — ABNORMAL HIGH (ref 6–20)
CO2: 27 mmol/L (ref 22–32)
Calcium: 8.3 mg/dL — ABNORMAL LOW (ref 8.9–10.3)
Chloride: 94 mmol/L — ABNORMAL LOW (ref 98–111)
Creatinine, Ser: 16.42 mg/dL — ABNORMAL HIGH (ref 0.44–1.00)
GFR, Estimated: 2 mL/min — ABNORMAL LOW (ref >60)
Glucose, Bld: 99 mg/dL (ref 70–99)
Potassium: 4.1 mmol/L (ref 3.5–5.1)
Sodium: 135 mmol/L (ref 135–145)
Total Bilirubin: 0.6 mg/dL (ref 0.3–1.2)
Total Protein: 6.4 g/dL — ABNORMAL LOW (ref 6.5–8.1)

## 2021-01-14 LAB — MAGNESIUM: Magnesium: 2.3 mg/dL (ref 1.7–2.4)

## 2021-01-14 LAB — C-REACTIVE PROTEIN: CRP: 13.1 mg/dL — ABNORMAL HIGH (ref <1.0)

## 2021-01-14 LAB — D-DIMER, QUANTITATIVE: D-Dimer, Quant: 1.11 ug/mL-FEU — ABNORMAL HIGH (ref 0.00–0.50)

## 2021-01-14 LAB — PHOSPHORUS: Phosphorus: 5.2 mg/dL — ABNORMAL HIGH (ref 2.5–4.6)

## 2021-01-14 LAB — FERRITIN: Ferritin: 111 ng/mL (ref 11–307)

## 2021-01-14 MED ORDER — HEPARIN SODIUM (PORCINE) 1000 UNIT/ML DIALYSIS
6600.0000 [IU] | INTRAMUSCULAR | Status: DC | PRN
  Administered 2021-01-14: 6600 [IU] via INTRAVENOUS_CENTRAL
  Filled 2021-01-14 (×3): qty 7

## 2021-01-14 MED ORDER — SODIUM CHLORIDE 0.9 % IV SOLN: 100.0000 mL | INTRAVENOUS | Status: DC | PRN

## 2021-01-14 MED ORDER — LIDOCAINE HCL (PF) 1 % IJ SOLN: 5.0000 mL | INTRAMUSCULAR | Status: DC | PRN

## 2021-01-14 MED ORDER — ALTEPLASE 2 MG IJ SOLR: 2.0000 mg | Freq: Once | INTRAMUSCULAR | Status: DC | PRN

## 2021-01-14 MED ORDER — LIDOCAINE-PRILOCAINE 2.5-2.5 % EX CREA: 1.0000 "application " | TOPICAL_CREAM | CUTANEOUS | Status: DC | PRN

## 2021-01-14 MED ORDER — HEPARIN SODIUM (PORCINE) 1000 UNIT/ML DIALYSIS: 1000.0000 [IU] | INTRAMUSCULAR | Status: DC | PRN

## 2021-01-14 MED ORDER — PENTAFLUOROPROP-TETRAFLUOROETH EX AERO: 1.0000 "application " | INHALATION_SPRAY | CUTANEOUS | Status: DC | PRN

## 2021-01-14 NOTE — Progress Notes (Signed)
[facility] Progress Note   Subjective: Seen on HD. UFG 1.5. Says she is going home today. Lower EDW on discharge.   Objective Vitals:   01/14/21 0949 01/14/21 1017 01/14/21 1045 01/14/21 1100  BP: 115/65 118/60 (!) 114/52 (!) 105/52  Pulse: 68 63 71 70  Resp: 15     Temp: 98.2 F (36.8 C)     TempSrc: Oral     SpO2: 100% 100% 100% 100%  Weight: 94.1 kg     Height:       Physical Exam General: Pleasant, WN, WD female in NAD Heart: S1,S2, RRR no M/R/G Lungs: CTAB. No SOB.  Abdomen:NABS, NT, ND Extremities: No LE edema Dialysis Access: L AVF blood lines connected    Additional Objective Labs: Basic Metabolic Panel: Recent Labs  Lab 01/12/21 0730 01/12/21 1149 01/13/21 0112 01/14/21 0219  NA 135  --  134* 135  K 4.8  --  4.4 4.1  CL 94*  --  93* 94*  CO2 27  --  26 27  GLUCOSE 114*  --  91 99  BUN 33*  --  46* 63*  CREATININE 11.65*  --  13.38* 16.42*  CALCIUM 8.8*  --  8.8* 8.3*  PHOS  --  5.5* 5.5* 5.2*   Liver Function Tests: Recent Labs  Lab 01/12/21 0730 01/13/21 0112 01/14/21 0219  AST 23 12* 12*  ALT 9 8 8   ALKPHOS 39 38 34*  BILITOT 1.2 0.7 0.6  PROT 6.7 6.8 6.4*  ALBUMIN 2.8* 2.8* 2.7*   Recent Labs  Lab 01/11/21 0327  LIPASE 45   CBC: Recent Labs  Lab 01/10/21 2323 01/11/21 1820 01/12/21 1149 01/13/21 0112 01/14/21 0219  WBC 5.3 6.3 3.4* 4.0 3.0*  NEUTROABS  --   --  1.7 1.6* 1.3*  HGB 10.7* 11.1* 9.6* 9.5* 8.9*  HCT 32.9* 34.2* 29.9* 29.7* 28.0*  MCV 93.7 93.4 94.3 94.0 94.3  PLT 167 166 139* 156 154   Blood Culture    Component Value Date/Time   SDES BLOOD RIGHT FOREARM 01/11/2021 0235   SPECREQUEST  01/11/2021 0235    BOTTLES DRAWN AEROBIC AND ANAEROBIC Blood Culture results may not be optimal due to an inadequate volume of blood received in culture bottles   CULT  01/11/2021 0235    NO GROWTH 3 DAYS Performed at [facility], [place] [address], [place], [place] [address]    REPTSTATUS PENDING  01/11/2021 0235    Cardiac Enzymes: No results for input(s): CKTOTAL, CKMB, CKMBINDEX, TROPONINI in the last 168 hours. CBG: Recent Labs  Lab 01/12/21 0752 01/12/21 1226 01/12/21 1722 01/12/21 2011 01/13/21 0804  GLUCAP 116* 95 104* 108* 96   Iron Studies:  Recent Labs    01/14/21 0219  FERRITIN 111   @[email or web address] @ Studies/Results: No results found. Medications:  sodium chloride     sodium chloride      calcium acetate  2,001 mg Oral TID WC   Chlorhexidine Gluconate Cloth  6 each Topical Q0600   cinacalcet  60 mg Oral QPM   [START ON 01/16/2021] darbepoetin (ARANESP) injection - DIALYSIS  100 mcg Intravenous Q Thu-HD   doxercalciferol  5 mcg Oral Q T,Th,Sa-HD   heparin  5,000 Units Subcutaneous Q8H   oxyCODONE  10 mg Oral Once   pantoprazole  40 mg Oral Daily   sevelamer carbonate  1,600 mg Oral TID WC     Dialysis Orders: Center: [facility] T,Th,S  3:45 hrs 180NRe 400/500 90 kg  2.0K/2.0 Ca AVF -Heparin 6600 units IV TIW -Mircera 100 mcg IV q 2 weeks (last dose 01/02/2021    Assessment/Plan Hyperkalemia in setting of missed HD. Resolved.  ESRD-T,Th, S Next HD 01/14/2021 Gastroenteritis: Per primary Sepsis D/T Covid 19- IV remdesivir started in ER, not hypoxic thus no steroids. Per primary.  Hypertension/volume-BP historically labile. Volume overload on adm resolved with HD. Has been losing weight, leaving under EDW. Lower EDW on discharge.  Anemia  -HGB 8.9. ESA due 01/17/2021.  Metabolic bone disease -continue binders/VDRA Nutrition -ALB low. Renal Diet, protein supps.   Thomos Domine H. Vance Hochmuth NP-C 01/14/2021, 12:53 PM  [employer] [phone]

## 2021-01-14 NOTE — Progress Notes (Signed)
Pt to d/c today. Contacted pt's clinic to advise them of pt's d/c today and to resume care on Thursday. Clinic aware pt is covid positive.   Melven Sartorius Renal Navigator [phone]

## 2021-01-14 NOTE — Discharge Summary (Signed)
PATIENT DETAILS Name: Desiree [name] Age: 45 y.o. Sex: female Date of Birth: [birth year] MRN: [id number]. Admitting Physician: Norval Morton, MD [id number], Ardelia Mems, MD  Admit Date: 01/10/2021 Discharge date: 01/14/2021  Recommendations for Outpatient Follow-up:  Follow up with PCP in 1-2 weeks Please obtain CMP/CBC in one week Incidental finding-benign cyst on left kidney-see below-we will need outpatient follow-up at discretion of PCP.  Admitted From:  Home  Disposition: [place]: No  Equipment/Devices: None  Discharge Condition: Stable  CODE STATUS: FULL CODE  Diet recommendation:  Diet Order             Diet - low sodium heart healthy           Diet renal with fluid restriction Fluid restriction: 1200 mL Fluid; Room service appropriate? Yes; Fluid consistency: Thin  Diet effective now                    Brief Summary: Patient is a 45 y.o. female with history of ESRD on HD, HTN-presented with 2-day history of nausea, vomiting, diarrhea, abdominal pain-missed her last HD treatment-found to have COVID-19 infection and hypokalemia.  Pertinent Labs/Radiology: 10/29>>Blood culture: No growth    10/29>> CT chest: Mild pulm edema-no consolidation 10/29>> CT abdomen/pelvis: No acute findings in the pelvis  Brief Hospital Course: Sepsis due to COVID-19 infection: Sepsis physiology has improved-GI symptoms have resolved.  Treated with Remdesivir x3 days.     Gastroenteritis: Suspect due to COVID-19 infection-GI symptoms have resolved.  Tolerating regular diet.   Pulm edema: Due to missed HD-stable/improved after HD on 10/29-not on any oxygen this morning.   Hyperkalemia: Due to missed HD-resolved with HD on 10/29.   ESRD on HD: Nephrology following and directing care   Normocytic anemia: Due to ESRD-defer Aranesp/IV iron to nephrology.  Follow CBC periodically.   Chronic pain syndrome: Continue oxycodone    Left leg pain: Dopplers were  negative.  No major swelling/erythema or other abnormalities evident on exam.   GERD: PPI   Large benign-appearing cyst on the left kidney: Seen incidentally on CT abdomen-stable for outpatient follow-up.   Obesity: Estimated body mass index is 34.85 kg/m as calculated from the following:   Height as of this encounter: 5\' 5"  (1.651 m).   Weight as of this encounter: 95 kg.   Procedures None  Discharge Diagnoses:  Principal Problem:   Sepsis ([place]) Active Problems:   Hyperkalemia   Anemia of chronic kidney failure   Chronic pain   ESRD (end stage renal disease) on dialysis ([place])   COVID-19 virus infection   GERD (gastroesophageal reflux disease)   Hyponatremia   Pulmonary edema   Discharge Instructions:  Activity:  As tolerated with Full fall precautions use walker/cane & assistance as needed   Discharge Instructions     Call MD for:  difficulty breathing, headache or visual disturbances   Complete by: As directed    Diet - low sodium heart healthy   Complete by: As directed    Discharge instructions   Complete by: As directed    Follow with Primary MD  Harvie Junior, MD in 1-2 weeks  Please get a complete blood count and chemistry panel checked by your Primary MD at your next visit, and again as instructed by your Primary MD.  Get Medicines reviewed and adjusted: Please take all your medications with you for your next visit with your Primary MD  Laboratory/radiological data: Please request your Primary  MD to go over all hospital tests and procedure/radiological results at the follow up, please ask your Primary MD to get all Hospital records sent to his/her office.  In some cases, they will be blood work, cultures and biopsy results pending at the time of your discharge. Please request that your primary care M.D. follows up on these results.  Also Note the following: If you experience worsening of your admission symptoms, develop shortness of breath, life  threatening emergency, suicidal or homicidal thoughts you must seek medical attention immediately by calling 911 or calling your MD immediately  if symptoms less severe.  You must read complete instructions/literature along with all the possible adverse reactions/side effects for all the Medicines you take and that have been prescribed to you. Take any new Medicines after you have completely understood and accpet all the possible adverse reactions/side effects.   Do not drive when taking Pain medications or sleeping medications (Benzodaizepines)  Do not take more than prescribed Pain, Sleep and Anxiety Medications. It is not advisable to combine anxiety,sleep and pain medications without talking with your primary care practitioner  Special Instructions: If you have smoked or chewed Tobacco  in the last 2 yrs please stop smoking, stop any regular Alcohol  and or any Recreational drug use.  Wear Seat belts while driving.  Please note: You were cared for by a hospitalist during your hospital stay. Once you are discharged, your primary care physician will handle any further medical issues. Please note that NO REFILLS for any discharge medications will be authorized once you are discharged, as it is imperative that you return to your primary care physician (or establish a relationship with a primary care physician if you do not have one) for your post hospital discharge needs so that they can reassess your need for medications and monitor your lab values.   1.  Please resume your hemodialysis schedule as previous.  2.  Incidental finding on CT abdomen-you have a benign-appearing left kidney cyst-please ask your primary care doctor to consider doing surveillance by periodic CT scans.   Increase activity slowly   Complete by: As directed       Allergies as of 01/14/2021       Reactions   Morphine Shortness Of Breath, Anaphylaxis   Prednisone Other (See Comments)   Other reaction(s): Other (See  Comments) Muscle spasms Patient says prednisone causes her to cramp all over, muscle spasms uncontrolled   Tuna [fish Allergy] Itching, Swelling, Rash, Other (See Comments)   Face droops also   Amlodipine    Angioedema (09/05/17 ED visit)   Iodinated Diagnostic Agents Itching   Tape Itching   Adhesive tape   paper tape ok        Medication List     TAKE these medications    albuterol 108 (90 Base) MCG/ACT inhaler Commonly known as: VENTOLIN HFA Inhale 2 puffs into the lungs every 4 (four) hours as needed for wheezing or shortness of breath.   alprazolam 2 MG tablet Commonly known as: XANAX Take 2 mg by mouth 3 (three) times daily as needed for anxiety.   Auryxia 1 GM 210 MG(Fe) tablet Generic drug: ferric citrate Take 420 mg by mouth 3 (three) times daily.   calcium acetate 667 MG capsule Commonly known as: PHOSLO Take 667-1,334 mg by mouth See admin instructions. 3 capsules with meals  1 to 2 capsules with snacks   cinacalcet 60 MG tablet Commonly known as: SENSIPAR Take 60 mg by mouth  every evening.   cyclobenzaprine 10 MG tablet Commonly known as: FLEXERIL Take 10 mg by mouth 2 (two) times daily as needed for muscle spasms.   Dialyvite 800 0.8 MG Wafr Take 1 tablet by mouth at bedtime.   diphenhydrAMINE 25 MG tablet Commonly known as: BENADRYL Take 1 tablet (25 mg total) by mouth every 6 (six) hours. What changed:  when to take this reasons to take this   Narcan 4 MG/0.1ML Liqd nasal spray kit Generic drug: naloxone Place 1 spray into the nose as needed (accidental overdose).   omeprazole 20 MG capsule Commonly known as: PRILOSEC Take 20 mg by mouth daily as needed (acid reflux).   ondansetron 8 MG disintegrating tablet Commonly known as: ZOFRAN-ODT Take 8 mg by mouth every 8 (eight) hours as needed for nausea or vomiting.   Oxycodone HCl 10 MG Tabs Take 10 mg by mouth in the morning, at noon, and at bedtime.   sevelamer carbonate 800 MG  tablet Commonly known as: RENVELA Take 1,600 mg by mouth 3 (three) times daily.        Allergies  Allergen Reactions   Morphine Shortness Of Breath and Anaphylaxis   Prednisone Other (See Comments)    Other reaction(s): Other (See Comments) Muscle spasms Patient says prednisone causes her to cramp all over, muscle spasms uncontrolled   Tuna [Fish Allergy] Itching, Swelling, Rash and Other (See Comments)    Face droops also   Amlodipine     Angioedema (09/05/17 ED visit)   Iodinated Diagnostic Agents Itching   Tape Itching    Adhesive tape   paper tape ok      Consultations: Renal   Other Procedures/Studies: DG Chest 2 View  Result Date: 01/11/2021 CLINICAL DATA:  Initial evaluation for acute chest pain. EXAM: CHEST - 2 VIEW COMPARISON:  Radiograph from 06/12/2019. FINDINGS: Mild cardiomegaly.  Mediastinal silhouette within normal limits. Lungs mildly hypoinflated. Mild perihilar and interstitial congestion without frank pulmonary edema. No visible pleural effusion. No consolidative airspace disease. No pneumothorax. Visualized osseous structures within normal limits. IMPRESSION: 1. Cardiomegaly with mild perihilar and interstitial congestion without frank pulmonary edema. 2. No other active cardiopulmonary disease. Electronically Signed   By: Jeannine Boga M.D.   On: 01/11/2021 00:39   DG Chest Port 1V same Day  Result Date: 01/12/2021 CLINICAL DATA:  Chest pain, short of breath and wheezing EXAM: PORTABLE CHEST 1 VIEW COMPARISON:  01/11/2021 FINDINGS: Normal cardiac silhouette.  No effusion, infiltrate or pneumothorax. Fine airspace disease again noted. IMPRESSION: Mild pulmonary edema pattern. Electronically Signed   By: Suzy Bouchard M.D.   On: 01/12/2021 07:43   CT CHEST ABDOMEN PELVIS WO CONTRAST  Result Date: 01/11/2021 CLINICAL DATA:  Abdominal pain and fever. Emesis and diarrhea. Short of breath. Dialysis patient. Positive COVID EXAM: CT CHEST, ABDOMEN  AND PELVIS WITHOUT CONTRAST TECHNIQUE: Multidetector CT imaging of the chest, abdomen and pelvis was performed following the standard protocol without IV contrast. COMPARISON:  CT 05/02/2018 FINDINGS: CT CHEST FINDINGS Cardiovascular: No significant vascular findings. Normal heart size. No pericardial effusion. Atherosclerotic calcification of the aorta. Mediastinum/Nodes: No axillary or supraclavicular adenopathy. No mediastinal or hilar adenopathy. No pericardial fluid. Esophagus normal. Lungs/Pleura: Mild ground-glass opacities in the lungs in a diffuse pattern. For example superior segment of the RIGHT lower lobe (image 56/5). No focal consolidation. No pleural fluid. No pneumothorax. Airways normal Musculoskeletal: No aggressive osseous lesion. CT ABDOMEN AND PELVIS FINDINGS Hepatobiliary: No focal hepatic lesion. No biliary ductal dilatation. Gallbladder is  normal. Common bile duct is normal. Pancreas: No pancreatic inflammation. Spleen: Normal spleen Adrenals/urinary tract: Adrenal glands are normal. The ureters and bladder normal. Large periphery calcified cystic mass emanating from the lower pole of the LEFT kidney has uniform central simple fluid attenuation. No change from prior. Cyst measures up to 9 cm. Kidneys are atrophic. Bladder collapsed Stomach/Bowel: Stomach, small bowel, appendix, and cecum are normal. The colon and rectosigmoid colon are normal. Vascular/Lymphatic: Abdominal aorta is normal caliber with atherosclerotic calcification. There is no retroperitoneal or periportal lymphadenopathy. No pelvic lymphadenopathy. Reproductive: Uterus and adnexa unremarkable. Other: Is Musculoskeletal: No aggressive osseous lesion. IMPRESSION: Chest Impression: 1. Mild pulmonary edema pattern in the lungs. 2. No focal consolidation Abdomen / Pelvis Impression: 1. No acute findings abdomen pelvis. 2. Large benign appearing cyst of the LEFT kidney. 3. Bilateral atrophic kidneys. 4.  Aortic Atherosclerosis  (ICD10-I70.0). Electronically Signed   By: Suzy Bouchard M.D.   On: 01/11/2021 06:05     TODAY-DAY OF DISCHARGE:  Subjective:   Desiree [name] today has no headache,no chest abdominal pain,no new weakness tingling or numbness, feels much better wants to go home today.   Objective:   Blood pressure (!) 112/56, pulse 72, temperature 98.8 F (37.1 C), temperature source Oral, resp. rate 16, height $RemoveBe'5\' 5"'LGXLvPydF$  (1.651 m), weight 92.3 kg, SpO2 98 %.  Intake/Output Summary (Last 24 hours) at 01/14/2021 1518 Last data filed at 01/14/2021 1321 Gross per 24 hour  Intake 440 ml  Output 1485 ml  Net -1045 ml   Filed Weights   01/14/21 0949 01/14/21 1321  Weight: 94.1 kg 92.3 kg    Exam: Awake Alert, Oriented *3, No new F.N deficits, Normal affect [place].AT,PERRAL Supple Neck,No JVD, No cervical lymphadenopathy appriciated.  Symmetrical Chest wall movement, Good air movement bilaterally, CTAB RRR,No Gallops,Rubs or new Murmurs, No Parasternal Heave +ve B.Sounds, Abd Soft, Non tender, No organomegaly appriciated, No rebound -guarding or rigidity. No Cyanosis, Clubbing or edema, No new Rash or bruise   PERTINENT RADIOLOGIC STUDIES: No results found.   PERTINENT LAB RESULTS: CBC: Recent Labs    01/13/21 0112 01/14/21 0219  WBC 4.0 3.0*  HGB 9.5* 8.9*  HCT 29.7* 28.0*  PLT 156 154   CMET CMP     Component Value Date/Time   NA 135 01/14/2021 0219   K 4.1 01/14/2021 0219   CL 94 (L) 01/14/2021 0219   CO2 27 01/14/2021 0219   GLUCOSE 99 01/14/2021 0219   BUN 63 (H) 01/14/2021 0219   CREATININE 16.42 (H) 01/14/2021 0219   CREATININE 19.35 (HH) 08/22/2013 0918   CALCIUM 8.3 (L) 01/14/2021 0219   PROT 6.4 (L) 01/14/2021 0219   ALBUMIN 2.7 (L) 01/14/2021 0219   AST 12 (L) 01/14/2021 0219   ALT 8 01/14/2021 0219   ALKPHOS 34 (L) 01/14/2021 0219   BILITOT 0.6 01/14/2021 0219   GFRNONAA 2 (L) 01/14/2021 0219   GFRAA 3 (L) 06/12/2019 0139    GFR Estimated Creatinine Clearance:  4.9 mL/min (A) (by C-G formula based on SCr of 16.42 mg/dL (H)). No results for input(s): LIPASE, AMYLASE in the last 72 hours. No results for input(s): CKTOTAL, CKMB, CKMBINDEX, TROPONINI in the last 72 hours. Invalid input(s): [place]    01/13/21 0112 01/14/21 0219  DDIMER 1.95* 1.11*   No results for input(s): HGBA1C in the last 72 hours. No results for input(s): CHOL, HDL, LDLCALC, TRIG, CHOLHDL, LDLDIRECT in the last 72 hours. No results for input(s): TSH, T4TOTAL, T3FREE, THYROIDAB  in the last 72 hours.  Invalid input(s): FREET3 Recent Labs    01/13/21 0112 01/14/21 0219  FERRITIN 121 111   Coags: No results for input(s): INR in the last 72 hours.  Invalid input(s): PT Microbiology: Recent Results (from the past 240 hour(s))  Resp Panel by RT-PCR (Flu A&B, Covid) Nasopharyngeal Swab     Status: Abnormal   Collection Time: 01/11/21 12:55 AM   Specimen: Nasopharyngeal Swab; Nasopharyngeal(NP) swabs in vial transport medium  Result Value Ref Range Status   SARS Coronavirus 2 by RT PCR POSITIVE (A) NEGATIVE Final    Comment: RESULT CALLED TO, READ BACK BY AND VERIFIED WITH: K WILLIAMS,RN@0522  01/11/21 [place] (NOTE) SARS-CoV-2 target nucleic acids are DETECTED.  The SARS-CoV-2 RNA is generally detectable in upper respiratory specimens during the acute phase of infection. Positive results are indicative of the presence of the identified virus, but do not rule out bacterial infection or co-infection with other pathogens not detected by the test. Clinical correlation with patient history and other diagnostic information is necessary to determine patient infection status. The expected result is Negative.  Fact Sheet for Patients: EntrepreneurPulse.com.au  Fact Sheet for Healthcare Providers: IncredibleEmployment.be  This test is not yet approved or cleared by the [place] FDA and  has been authorized for detection and/or  diagnosis of SARS-CoV-2 by FDA under an Emergency Use Authorization (EUA).  This EUA will remain in effect (meaning this test can be [place] ed) for the duration of  the COVID-19 declaration under Section 564(b)(1) of the Act, 21 U.S.C. section 360bbb-3(b)(1), unless the authorization is terminated or revoked sooner.     Influenza A by PCR NEGATIVE NEGATIVE Final   Influenza B by PCR NEGATIVE NEGATIVE Final    Comment: (NOTE) The Xpert Xpress SARS-CoV-2/FLU/RSV plus assay is intended as an aid in the diagnosis of influenza from Nasopharyngeal swab specimens and should not be used as a sole basis for treatment. Nasal washings and aspirates are unacceptable for Xpert Xpress SARS-CoV-2/FLU/RSV testing.  Fact Sheet for Patients: EntrepreneurPulse.com.au  Fact Sheet for Healthcare Providers: IncredibleEmployment.be  This test is not yet approved or cleared by the [place] FDA and has been authorized for detection and/or diagnosis of SARS-CoV-2 by FDA under an Emergency Use Authorization (EUA). This EUA will remain in effect (meaning this test can be used) for the duration of the COVID-19 declaration under Section 564(b)(1) of the Act, 21 U.S.C. section 360bbb-3(b)(1), unless the authorization is terminated or revoked.  Performed at [facility], [place] [address] [place], [place] [address]   Blood culture (routine x 2)     Status: None (Preliminary result)   Collection Time: 01/11/21  2:35 AM   Specimen: BLOOD RIGHT FOREARM  Result Value Ref Range Status   Specimen Description BLOOD RIGHT FOREARM  Final   Special Requests   Final    BOTTLES DRAWN AEROBIC AND ANAEROBIC Blood Culture results may not be optimal due to an inadequate volume of blood received in culture bottles   Culture   Final    NO GROWTH 3 DAYS Performed at [facility], [place] [address], [place], [place] [address]    Report Status PENDING  Incomplete    FURTHER  DISCHARGE INSTRUCTIONS:  Get Medicines reviewed and adjusted: Please take all your medications with you for your next visit with your Primary MD  Laboratory/radiological data: Please request your Primary MD to go over all hospital tests and procedure/radiological results at the follow up, please ask  your Primary MD to get all Hospital records sent to his/her office.  In some cases, they will be blood work, cultures and biopsy results pending at the time of your discharge. Please request that your primary care M.D. goes through all the records of your hospital data and follows up on these results.  Also Note the following: If you experience worsening of your admission symptoms, develop shortness of breath, life threatening emergency, suicidal or homicidal thoughts you must seek medical attention immediately by calling 911 or calling your MD immediately  if symptoms less severe.  You must read complete instructions/literature along with all the possible adverse reactions/side effects for all the Medicines you take and that have been prescribed to you. Take any new Medicines after you have completely understood and accpet all the possible adverse reactions/side effects.   Do not drive when taking Pain medications or sleeping medications (Benzodaizepines)  Do not take more than prescribed Pain, Sleep and Anxiety Medications. It is not advisable to combine anxiety,sleep and pain medications without talking with your primary care practitioner  Special Instructions: If you have smoked or chewed Tobacco  in the last 2 yrs please stop smoking, stop any regular Alcohol  and or any Recreational drug use.  Wear Seat belts while driving.  Please note: You were cared for by a hospitalist during your hospital stay. Once you are discharged, your primary care physician will handle any further medical issues. Please note that NO REFILLS for any discharge medications will be authorized once you are discharged,  as it is imperative that you return to your primary care physician (or establish a relationship with a primary care physician if you do not have one) for your post hospital discharge needs so that they can reassess your need for medications and monitor your lab values.  Total Time spent coordinating discharge including counseling, education and face to face time equals 35 minutes.  SignedOren Binet 01/14/2021 3:18 PM

## 2021-01-14 NOTE — Progress Notes (Signed)
Bilateral lower extremity venous duplex completed. Refer to "CV Proc" under chart review to view preliminary results.  01/14/2021 3:20 PM Kelby Aline., MHA, RVT, RDCS, RDMS

## 2021-01-14 NOTE — Progress Notes (Signed)
Patient states she feels hot. Decreased bp noted. UF goal decreased. Temperature decreased.

## 2021-01-15 ENCOUNTER — Telehealth: Payer: Self-pay | Admitting: Nephrology

## 2021-01-15 NOTE — Telephone Encounter (Signed)
Transition of Care Contact from [place]  Date of Discharge: 01/14/2021 Date of Contact: 01/15/2021 Method of contact: phone - attempted  Attempted to contact patient to discuss transition of care from inpatient admission.  Patient did not answer the phone.  Will attempt to call them again and if unable to reach will follow up at dialysis.  Jen Mow, PA-C [facility] Pager: [phone]

## 2021-01-16 LAB — CULTURE, BLOOD (ROUTINE X 2): Culture: NO GROWTH

## 2021-02-15 ENCOUNTER — Observation Stay (HOSPITAL_COMMUNITY)
Admission: EM | Admit: 2021-02-15 | Discharge: 2021-02-17 | Disposition: A | Discharge status: Home / Self Care | Payer: Medicare Other | Attending: Internal Medicine | Admitting: Internal Medicine

## 2021-02-15 ENCOUNTER — Encounter (HOSPITAL_COMMUNITY): Payer: Self-pay | Admitting: *Deleted

## 2021-02-15 ENCOUNTER — Other Ambulatory Visit: Payer: Self-pay

## 2021-02-15 ENCOUNTER — Emergency Department (HOSPITAL_COMMUNITY): Payer: Medicare Other

## 2021-02-15 DIAGNOSIS — D8489 Other immunodeficiencies: Secondary | ICD-10-CM | POA: Diagnosis not present

## 2021-02-15 DIAGNOSIS — Z992 Dependence on renal dialysis: Secondary | ICD-10-CM | POA: Diagnosis not present

## 2021-02-15 DIAGNOSIS — N186 End stage renal disease: Secondary | ICD-10-CM | POA: Insufficient documentation

## 2021-02-15 DIAGNOSIS — Z79899 Other long term (current) drug therapy: Secondary | ICD-10-CM | POA: Insufficient documentation

## 2021-02-15 DIAGNOSIS — R1084 Generalized abdominal pain: Secondary | ICD-10-CM

## 2021-02-15 DIAGNOSIS — Z20822 Contact with and (suspected) exposure to covid-19: Secondary | ICD-10-CM | POA: Insufficient documentation

## 2021-02-15 DIAGNOSIS — I120 Hypertensive chronic kidney disease with stage 5 chronic kidney disease or end stage renal disease: Secondary | ICD-10-CM | POA: Diagnosis not present

## 2021-02-15 DIAGNOSIS — R112 Nausea with vomiting, unspecified: Secondary | ICD-10-CM | POA: Diagnosis present

## 2021-02-15 DIAGNOSIS — K529 Noninfective gastroenteritis and colitis, unspecified: Secondary | ICD-10-CM | POA: Diagnosis not present

## 2021-02-15 DIAGNOSIS — R1011 Right upper quadrant pain: Secondary | ICD-10-CM

## 2021-02-15 LAB — CBC WITH DIFFERENTIAL/PLATELET
Abs Immature Granulocytes: 0.01 10*3/uL (ref 0.00–0.07)
Basophils Absolute: 0 10*3/uL (ref 0.0–0.1)
Basophils Relative: 0 %
Eosinophils Absolute: 0.5 10*3/uL (ref 0.0–0.5)
Eosinophils Relative: 7 %
HCT: 32.3 % — ABNORMAL LOW (ref 36.0–46.0)
Hemoglobin: 10.6 g/dL — ABNORMAL LOW (ref 12.0–15.0)
Immature Granulocytes: 0 %
Lymphocytes Relative: 31 %
Lymphs Abs: 2.2 10*3/uL (ref 0.7–4.0)
MCH: 30.6 pg (ref 26.0–34.0)
MCHC: 32.8 g/dL (ref 30.0–36.0)
MCV: 93.4 fL (ref 80.0–100.0)
Monocytes Absolute: 0.6 10*3/uL (ref 0.1–1.0)
Monocytes Relative: 9 %
Neutro Abs: 3.7 10*3/uL (ref 1.7–7.7)
Neutrophils Relative %: 53 %
Platelets: 193 10*3/uL (ref 150–400)
RBC: 3.46 MIL/uL — ABNORMAL LOW (ref 3.87–5.11)
RDW: 17.5 % — ABNORMAL HIGH (ref 11.5–15.5)
WBC: 7.1 10*3/uL (ref 4.0–10.5)
nRBC: 0 % (ref 0.0–0.2)

## 2021-02-15 LAB — COMPREHENSIVE METABOLIC PANEL
ALT: 9 U/L (ref 0–44)
AST: 12 U/L — ABNORMAL LOW (ref 15–41)
Albumin: 3.3 g/dL — ABNORMAL LOW (ref 3.5–5.0)
Alkaline Phosphatase: 44 U/L (ref 38–126)
Anion gap: 18 — ABNORMAL HIGH (ref 5–15)
BUN: 68 mg/dL — ABNORMAL HIGH (ref 6–20)
CO2: 24 mmol/L (ref 22–32)
Calcium: 8.6 mg/dL — ABNORMAL LOW (ref 8.9–10.3)
Chloride: 94 mmol/L — ABNORMAL LOW (ref 98–111)
Creatinine, Ser: 20.45 mg/dL — ABNORMAL HIGH (ref 0.44–1.00)
GFR, Estimated: 2 mL/min — ABNORMAL LOW (ref >60)
Glucose, Bld: 82 mg/dL (ref 70–99)
Potassium: 4.4 mmol/L (ref 3.5–5.1)
Sodium: 136 mmol/L (ref 135–145)
Total Bilirubin: 0.5 mg/dL (ref 0.3–1.2)
Total Protein: 7.4 g/dL (ref 6.5–8.1)

## 2021-02-15 LAB — I-STAT BETA HCG BLOOD, ED (MC, WL, AP ONLY): I-stat hCG, quantitative: 5.6 m[IU]/mL — ABNORMAL HIGH (ref <5)

## 2021-02-15 LAB — LIPASE, BLOOD: Lipase: 129 U/L — ABNORMAL HIGH (ref 11–51)

## 2021-02-15 NOTE — ED Provider Notes (Signed)
Emergency Medicine Provider Triage Evaluation Note  Desiree [name] , a 45 y.o. female  was evaluated in triage.  Pt complains of abdominal pain.  States that she has had severe abdominal pains for 2 days now.  She has had associated nausea and vomiting.  She states that she has had some small bowel movements but they are not normal.  Denies any diarrhea though.  She is a dialysis patient and goes to dialysis on Tuesday Thursday and Saturday.  She missed both Saturday and Thursday to the last time that she went was on Tuesday.  She denies any chest pain, shortness of breath, palpitations.  Review of Systems  Positive:  Negative: See above  Physical Exam  BP 126/84 (BP Location: Left Arm)   Pulse 74   Temp 99.3 F (37.4 C) (Oral)   Resp 15   Ht 5\' 5"  (1.651 m)   Wt 86.2 kg   SpO2 100%   BMI 31.62 kg/m  Gen:   Awake, no distress   Resp:  Normal effort  MSK:   Moves extremities without difficulty  Other:  Abdomen is soft but very tender to palpation in all quadrants.  She does have some guarding and rebound.  Medical Decision Making  Medically screening exam initiated at 10:12 PM.  Appropriate orders placed.  Ariyel BLAKELEE ALLINGTON was informed that the remainder of the evaluation will be completed by another provider, this initial triage assessment does not replace that evaluation, and the importance of remaining in the ED until their evaluation is complete.  CT Noncon ordered due to contrast allergy.  She is a dialysis patient so we can get contrasted imaging if needed.   Sheila Oats 02/15/21 2214    Carmin Muskrat, MD 02/16/21 1115

## 2021-02-15 NOTE — ED Triage Notes (Signed)
No dialysis since Tuesday  she has abd pain since  Friday  with nausea vomiting and diarhea  fistula lt upper arm

## 2021-02-16 ENCOUNTER — Observation Stay (HOSPITAL_COMMUNITY): Payer: Medicare Other

## 2021-02-16 ENCOUNTER — Encounter (HOSPITAL_COMMUNITY): Payer: Self-pay | Admitting: Internal Medicine

## 2021-02-16 DIAGNOSIS — K529 Noninfective gastroenteritis and colitis, unspecified: Secondary | ICD-10-CM | POA: Diagnosis not present

## 2021-02-16 LAB — HEPATITIS B SURFACE ANTIBODY,QUALITATIVE: Hep B S Ab: REACTIVE — AB

## 2021-02-16 LAB — MRSA NEXT GEN BY PCR, NASAL: MRSA by PCR Next Gen: NOT DETECTED

## 2021-02-16 LAB — HEPATITIS B SURFACE ANTIGEN: Hepatitis B Surface Ag: NONREACTIVE

## 2021-02-16 LAB — RESP PANEL BY RT-PCR (FLU A&B, COVID) ARPGX2
Influenza A by PCR: NEGATIVE
Influenza B by PCR: NEGATIVE
SARS Coronavirus 2 by RT PCR: NEGATIVE

## 2021-02-16 MED ORDER — ONDANSETRON HCL 4 MG/2ML IJ SOLN: 4.0000 mg | Freq: Four times a day (QID) | INTRAMUSCULAR | Status: DC | PRN

## 2021-02-16 MED ORDER — ACETAMINOPHEN 325 MG PO TABS: 650.0000 mg | ORAL_TABLET | Freq: Four times a day (QID) | ORAL | Status: DC | PRN

## 2021-02-16 MED ORDER — ALPRAZOLAM 0.5 MG PO TABS
2.0000 mg | ORAL_TABLET | Freq: Three times a day (TID) | ORAL | Status: DC | PRN
  Administered 2021-02-16: 2 mg via ORAL
  Administered 2021-02-17 (×2): 1 mg via ORAL
  Filled 2021-02-16 (×3): qty 4

## 2021-02-16 MED ORDER — OXYCODONE HCL 5 MG PO TABS
10.0000 mg | ORAL_TABLET | Freq: Three times a day (TID) | ORAL | Status: DC | PRN
  Administered 2021-02-16: 10 mg via ORAL
  Administered 2021-02-17 (×2): 5 mg via ORAL
  Filled 2021-02-16 (×5): qty 2

## 2021-02-16 MED ORDER — POLYETHYLENE GLYCOL 3350 17 G PO PACK: 17.0000 g | PACK | Freq: Every day | ORAL | Status: DC | PRN

## 2021-02-16 MED ORDER — FENTANYL CITRATE PF 50 MCG/ML IJ SOSY: 12.5000 ug | PREFILLED_SYRINGE | INTRAMUSCULAR | Status: DC | PRN

## 2021-02-16 MED ORDER — ONDANSETRON HCL 4 MG PO TABS
4.0000 mg | ORAL_TABLET | Freq: Four times a day (QID) | ORAL | Status: DC | PRN
  Administered 2021-02-16: 4 mg via ORAL
  Filled 2021-02-16: qty 1

## 2021-02-16 MED ORDER — ALBUTEROL SULFATE HFA 108 (90 BASE) MCG/ACT IN AERS
2.0000 | INHALATION_SPRAY | RESPIRATORY_TRACT | Status: DC | PRN
  Filled 2021-02-16: qty 6.7

## 2021-02-16 MED ORDER — CHLORHEXIDINE GLUCONATE CLOTH 2 % EX PADS: 6.0000 | MEDICATED_PAD | Freq: Every day | CUTANEOUS | Status: DC

## 2021-02-16 MED ORDER — ACETAMINOPHEN 650 MG RE SUPP: 650.0000 mg | Freq: Four times a day (QID) | RECTAL | Status: DC | PRN

## 2021-02-16 MED ORDER — HEPARIN SODIUM (PORCINE) 5000 UNIT/ML IJ SOLN
5000.0000 [IU] | Freq: Three times a day (TID) | INTRAMUSCULAR | Status: DC
  Administered 2021-02-16: 5000 [IU] via SUBCUTANEOUS
  Filled 2021-02-16 (×2): qty 1

## 2021-02-16 MED ORDER — HEPARIN SODIUM (PORCINE) 1000 UNIT/ML DIALYSIS
3000.0000 [IU] | INTRAMUSCULAR | Status: DC | PRN
  Administered 2021-02-17: 3000 [IU] via INTRAVENOUS_CENTRAL
  Filled 2021-02-16: qty 3

## 2021-02-16 MED ORDER — OXYCODONE-ACETAMINOPHEN 5-325 MG PO TABS
2.0000 | ORAL_TABLET | Freq: Once | ORAL | Status: AC
  Administered 2021-02-16: 10:00:00 2 via ORAL
  Filled 2021-02-16: qty 2

## 2021-02-16 NOTE — ED Provider Notes (Addendum)
[facility] EMERGENCY DEPARTMENT Provider Note   CSN: [id number] Arrival date & time: 02/15/21  1903     History Chief Complaint  Patient presents with   Abdominal Pain    Desiree [name] is a 45 y.o. female.  45 year old female presents today for evaluation of 2-day duration of abdominal pain along with nausea and vomiting.  She is a end-stage renal disease patient on dialysis.  Her last session was Tuesday.  She reports Saturday morning she woke up and called her dialysis center who recommended if her nausea and vomiting was not improved by 8:30 a.m. that she present to the emergency room.  She states her last episode of vomiting was yesterday afternoon.  Since then she has had isolated episodes of abdominal cramping every 10 to 15 minutes.  She denies chest pain, lightheadedness, palpitations, dysuria.  She states she had 1 episode of loose stools but denies diarrhea.  The history is provided by the patient. No language interpreter was used.      Past Medical History:  Diagnosis Date   Anemia    Anxiety    panic attacks   Arthritis    bilateral knees   Childhood asthma    Complication of anesthesia    "sometimes it does not work; didn't during LEEP OR" (01/21/2016)   Depression    no med   ESRD (end stage renal disease) on dialysis ([place])    "TTS; [facility]; [facility]" (08/03/2016)   GERD (gastroesophageal reflux disease)    nexium prn   Gout    History of blood transfusion    "related to kidneys; I've had 4" (01/21/2016)   Hypertension    Migraine    last one 01/18/19   Preterm labor ~ 2014   Renal insufficiency    Retina hole, left    Seizures ([place])    "last one was in 2000; related to preeclampsia" (01/21/2016)    Patient Active Problem List   Diagnosis Date Noted   COVID-19 virus infection 01/11/2021   Sepsis ([place]) 01/11/2021   GERD (gastroesophageal reflux disease) 01/11/2021   Hyponatremia 01/11/2021   Pulmonary edema 01/11/2021    ESRD (end stage renal disease) on dialysis ([place]) 06/02/2018   Acute respiratory failure ([place]) 06/02/2018   Fever 12/07/2017   Abdominal pain 08/03/2016   Chronic pain 01/21/2016   Muscle cramps 03/29/2015   Anemia of chronic kidney failure 09/15/2013   Hyperkalemia 08/29/2013   Cocaine abuse ([place]) 08/26/2013   Depression 04/21/2012   ESRD (end stage renal disease) ([place]) 08/03/2011    Past Surgical History:  Procedure Laterality Date   AV FISTULA PLACEMENT Left 2010   AV FISTULA PLACEMENT Left 01/20/2019   Procedure: ARTERIOVENOUS (AV) FISTULA CREATION LEFT UPPER ARM;  Surgeon: Angelia Mould, MD;  Location: [place];  Service: Vascular;  Laterality: Left;   CERVICAL BIOPSY  W/ LOOP ELECTRODE EXCISION  2001   DILATION AND EVACUATION  08/02/2011   Procedure: DILATATION AND EVACUATION;  Surgeon: Logan Bores, MD;  Location: [facility];  Service: Gynecology;;   DILATION AND EVACUATION N/A 08/31/2013   Procedure: DILATATION AND EVACUATION;  Surgeon: [place] Mode, MD;  Location: [facility];  Service: Gynecology;  Laterality: N/A;   FISTULA SUPERFICIALIZATION Left 01/20/2019   Procedure: Fistula Superficialization;  Surgeon: Angelia Mould, MD;  Location: [facility] OR;  Service: Vascular;  Laterality: Left;   HYDRADENITIS EXCISION Right    RENAL BIOPSY     REVISION OF ARTERIOVENOUS GORETEX GRAFT  Left 02/11/2013   Procedure: REVISION OF ARTERIOVENOUS GORTEX FISTULA;  Surgeon: Rosetta Posner, MD;  Location: [facility] OR;  Service: Vascular;  Laterality: Left;   THROMBECTOMY W/ EMBOLECTOMY Left 05/25/2018   Procedure: REPAIR OF BLEEDING ARTERIOVENOUS FISTULA;  Surgeon: Serafina Mitchell, MD;  [facility];  Service: Vascular;  Laterality: Left;     OB History     Gravida  6   Para  1   Term  1   Preterm  0   AB  4   Living  1      SAB  2   IAB  2   Ectopic  0   Multiple  0   Live Births  1           Family History  Problem Relation Age of Onset   Diabetes Mother     Hyperlipidemia Mother    Hypertension Mother    Heart disease Mother    Hypertension Other    Diabetes type II Other     Social History   Tobacco Use   Smoking status: Passive Smoke Exposure - Never Smoker   Smokeless tobacco: Never   Tobacco comments:    Husband Smokes  Vaping Use   Vaping Use: Never used  Substance Use Topics   Alcohol use: No   Drug use: Yes    Types: Marijuana    Comment: Last use Tues 01/17/19    Home Medications Prior to Admission medications   Medication Sig Start Date End Date Taking? Authorizing Provider  albuterol (PROVENTIL HFA;VENTOLIN HFA) 108 (90 Base) MCG/ACT inhaler Inhale 2 puffs into the lungs every 4 (four) hours as needed for wheezing or shortness of breath. 05/02/16   Forde Dandy, MD  alprazolam Duanne Moron) 2 MG tablet Take 2 mg by mouth 3 (three) times daily as needed for anxiety. 12/30/18   [provider]  AURYXIA 1 GM 210 MG(Fe) tablet Take 420 mg by mouth 3 (three) times daily. 01/02/21   [provider]  B Complex-C-Folic Acid (DIALYVITE 161) 0.8 MG WAFR Take 1 tablet by mouth at bedtime. 03/23/19   [provider]  calcium acetate (PHOSLO) 667 MG capsule Take 667-1,334 mg by mouth See admin instructions. 3 capsules with meals  1 to 2 capsules with snacks 11/30/17   [provider]  cinacalcet (SENSIPAR) 60 MG tablet Take 60 mg by mouth every evening.    [provider]  cyclobenzaprine (FLEXERIL) 10 MG tablet Take 10 mg by mouth 2 (two) times daily as needed for muscle spasms.    [provider]  diphenhydrAMINE (BENADRYL) 25 MG tablet Take 1 tablet (25 mg total) by mouth every 6 (six) hours. Patient taking differently: Take 25 mg by mouth every 6 (six) hours as needed for itching or allergies. 09/05/17   Domenic Moras, PA-C  NARCAN 4 MG/0.1ML LIQD nasal spray kit Place 1 spray into the nose as needed (accidental overdose). 11/14/18   [provider]  omeprazole (PRILOSEC) 20 MG  capsule Take 20 mg by mouth daily as needed (acid reflux).  03/31/18   [provider]  ondansetron (ZOFRAN-ODT) 8 MG disintegrating tablet Take 8 mg by mouth every 8 (eight) hours as needed for nausea or vomiting.     [provider]  Oxycodone HCl 10 MG TABS Take 10 mg by mouth in the morning, at noon, and at bedtime. 05/17/19   [provider]  sevelamer carbonate (RENVELA) 800 MG tablet Take 1,600 mg  by mouth 3 (three) times daily. 05/18/19   [provider]    Allergies    Morphine, Prednisone, Blain Pais allergy], Amlodipine, Iodinated diagnostic agents, and Tape  Review of Systems   Review of Systems  Constitutional:  Positive for appetite change. Negative for chills and fever.  Respiratory:  Negative for shortness of breath.   Cardiovascular:  Negative for chest pain and palpitations.  Gastrointestinal:  Positive for abdominal pain, nausea and vomiting. Negative for blood in stool and diarrhea.  Neurological:  Negative for light-headedness.  All other systems reviewed and are negative.  Physical Exam Updated Vital Signs BP 140/86   Pulse 86   Temp 98.5 F (36.9 C)   Resp 17   Ht $R'5\' 5"'eH$  (1.651 m)   Wt 86.2 kg   LMP 02/12/2021   SpO2 98%   BMI 31.62 kg/m   Physical Exam Vitals and nursing note reviewed.  Constitutional:      General: She is not in acute distress.    Appearance: Normal appearance. She is not ill-appearing.  HENT:     Head: Normocephalic and atraumatic.     Nose: Nose normal.  Eyes:     General: No scleral icterus.    Extraocular Movements: Extraocular movements intact.     Conjunctiva/sclera: Conjunctivae normal.  Cardiovascular:     Rate and Rhythm: Normal rate and regular rhythm.     Pulses: Normal pulses.     Heart sounds: Normal heart sounds.  Pulmonary:     Effort: Pulmonary effort is normal. No respiratory distress.     Breath sounds: Normal breath sounds. No wheezing.  Abdominal:     General: There is no  distension.     Tenderness: There is abdominal tenderness.  Musculoskeletal:        General: Normal range of motion.     Cervical back: Normal range of motion.  Skin:    General: Skin is warm and dry.  Neurological:     General: No focal deficit present.     Mental Status: She is alert. Mental status is at baseline.    ED Results / Procedures / Treatments   Labs (all labs ordered are listed, but only abnormal results are displayed) Labs Reviewed  CBC WITH DIFFERENTIAL/PLATELET - Abnormal; Notable for the following components:      Result Value   RBC 3.46 (*)    Hemoglobin 10.6 (*)    HCT 32.3 (*)    RDW 17.5 (*)    All other components within normal limits  COMPREHENSIVE METABOLIC PANEL - Abnormal; Notable for the following components:   Chloride 94 (*)    BUN 68 (*)    Creatinine, Ser 20.45 (*)    Calcium 8.6 (*)    Albumin 3.3 (*)    AST 12 (*)    GFR, Estimated 2 (*)    Anion gap 18 (*)    All other components within normal limits  LIPASE, BLOOD - Abnormal; Notable for the following components:   Lipase 129 (*)    All other components within normal limits  I-STAT BETA HCG BLOOD, ED ([facility], [facility], [facility] ONLY) - Abnormal; Notable for the following components:   I-stat hCG, quantitative 5.6 (*)    All other components within normal limits    EKG None  Radiology CT Abdomen Pelvis Wo Contrast  Result Date: 02/15/2021 CLINICAL DATA:  Generalized abdominal pain for 2 days, initial encounter EXAM: CT ABDOMEN AND PELVIS WITHOUT CONTRAST TECHNIQUE: Multidetector CT imaging of  the abdomen and pelvis was performed following the standard protocol without IV contrast. COMPARISON:  01/11/2021 FINDINGS: Lower chest: No acute abnormality. Hepatobiliary: No focal liver abnormality is seen. No gallstones, gallbladder wall thickening, or biliary dilatation. Pancreas: Unremarkable. No pancreatic ductal dilatation or surrounding inflammatory changes. Spleen: Normal in size without focal  abnormality. Adrenals/Urinary Tract: Adrenal glands are within normal limits. Right kidney is shrunken but stable from the prior exam. Scattered cystic changes are noted. Cystic lesion with peripheral calcification is again identified in the lower pole of the left kidney stable in appearance from the prior study. No obstructive changes are seen. The bladder is decompressed. Stomach/Bowel: No obstructive or inflammatory changes of the colon are seen. The appendix is within normal limits. Stomach is decompressed. Some mildly thickened small bowel loops are identified in the right mid abdomen with fluid and some mild surrounding inflammatory change. This may represent a degree of inflammatory bowel disease. These changes are new from the prior exam. Vascular/Lymphatic: Aortic atherosclerosis. No enlarged abdominal or pelvic lymph nodes. Reproductive: Uterus is within normal limits. There is a 4.4 cm cystic appearing lesion arising in the right adnexa somewhat enlarged when compared with the prior exam. This is incompletely characterized on noncontrast images. Other: No abdominal wall hernia or abnormality. No abdominopelvic ascites. Musculoskeletal: No acute or significant osseous findings. IMPRESSION: Thickening of multiple distal small bowel loops with fluid within which likely represents a degree of enteritis or possible early inflammatory bowel disease. Stable peripherally calcified cyst within the left kidney. 4.4 cm simple appearing cystic lesion within the right adnexa. This is slightly more prominent than that seen on the prior exam at which time it measured approximately 3 cm. No follow-up imaging recommended. Note: This recommendation does not apply to premenarchal patients and to those with increased risk (genetic, family history, elevated tumor markers or other high-risk factors) of ovarian cancer. Reference: JACR 2020 Feb; 17(2):248-254 Electronically Signed   By: Inez Catalina M.D.   On: 02/15/2021 23:02    DG Chest 2 View  Result Date: 02/15/2021 CLINICAL DATA:  Missed dialysis EXAM: CHEST - 2 VIEW COMPARISON:  01/12/2021 FINDINGS: The heart size and mediastinal contours are within normal limits. Both lungs are clear. The visualized skeletal structures are unremarkable. IMPRESSION: No active cardiopulmonary disease. Electronically Signed   By: Ulyses Jarred M.D.   On: 02/15/2021 23:05    Procedures Procedures   Medications Ordered in ED Medications - No data to display  ED Course  I have reviewed the triage vital signs and the nursing notes.  Pertinent labs & imaging results that were available during my care of the patient were reviewed by me and considered in my medical decision making (see chart for details).    MDM Rules/Calculators/A&P                           45 year old female past medical history of end-stage renal disease on dialysis presents today for evaluation of abdominal pain, nausea vomiting.  Patient's last dialysis session was Tuesday and she has missed the past 2 dialysis sessions.  Patient CT is significant for enteritis, and exam is significant for diffuse tenderness to palpation.  Patient's lipase is elevated, and creatinine is increased to 20 from her baseline.  Given her presenting symptoms and increased lipase and creatinine will discuss with hospitalist for admission and dialysis treatment.    Discussed with hospitalist who will evaluate patient for admission.  Final Clinical Impression(s) /  ED Diagnoses Final diagnoses:  Generalized abdominal pain    Rx / DC Orders ED Discharge Orders     None        Evlyn Courier, PA-C 02/16/21 [id number]    Davonna Belling, MD 02/16/21 1912    Evlyn Courier, PA-C 02/26/21 2157    Davonna Belling, MD 02/27/21 [phone]

## 2021-02-16 NOTE — Consult Note (Signed)
Renal Service Consult Note [facility]  Desiree [name] 02/16/2021 Sol Blazing, MD Requesting Physician: Dr. Heber Mitchell  Reason for Consult: ESRD pt w/ nausea and vomiting HPI: The patient is a 45 y.o. year-old w/ hx of anemia, anxiety, DJD, ESRD on HD, gout, GERD, HTN, migraine and seizures presented to ED last night for abd pain w/ n/v/d starting on Friday 12/2 after eating chicken and cheese egg roll. Had significant vomiting Sat morning for several hours.  Missed HD Sat. Says she missed Thursday as well.  In ED K 4.4, creat 20. Asked to see for ESRD.    Pt on HD for 13 years. Was on transplant list , thinking about getting back on.  Pt denies any swelling in legs, no fevers, no cough or SOB.   ROS - denies CP, no joint pain, no HA, no blurry vision, no rash  Past Medical History  Past Medical History:  Diagnosis Date   Anemia    Anxiety    panic attacks   Arthritis    bilateral knees   Childhood asthma    Complication of anesthesia    "sometimes it does not work; didn't during LEEP OR" (01/21/2016)   Depression    no med   ESRD (end stage renal disease) on dialysis ([place])    "TTS; [facility]; [facility]" (08/03/2016)   GERD (gastroesophageal reflux disease)    nexium prn   Gout    History of blood transfusion    "related to kidneys; I've had 4" (01/21/2016)   Hypertension    Migraine    last one 01/18/19   Preterm labor ~ 2014   Renal insufficiency    Retina hole, left    Seizures ([place])    "last one was in 2000; related to preeclampsia" (01/21/2016)   Past Surgical History  Past Surgical History:  Procedure Laterality Date   AV FISTULA PLACEMENT Left 2010   AV FISTULA PLACEMENT Left 01/20/2019   Procedure: ARTERIOVENOUS (AV) FISTULA CREATION LEFT UPPER ARM;  Surgeon: Angelia Mould, MD;  Location: [place];  Service: Vascular;  Laterality: Left;   CERVICAL BIOPSY  W/ LOOP ELECTRODE EXCISION  2001   DILATION AND EVACUATION  08/02/2011    Procedure: DILATATION AND EVACUATION;  Surgeon: Logan Bores, MD;  Location: [facility];  Service: Gynecology;;   DILATION AND EVACUATION N/A 08/31/2013   Procedure: DILATATION AND EVACUATION;  Surgeon: [place] Mode, MD;  Location: [facility];  Service: Gynecology;  Laterality: N/A;   FISTULA SUPERFICIALIZATION Left 01/20/2019   Procedure: Fistula Superficialization;  Surgeon: Angelia Mould, MD;  Location: [place];  Service: Vascular;  Laterality: Left;   HYDRADENITIS EXCISION Right    RENAL BIOPSY     REVISION OF ARTERIOVENOUS GORETEX GRAFT Left 02/11/2013   Procedure: REVISION OF ARTERIOVENOUS GORTEX FISTULA;  Surgeon: Rosetta Posner, MD;  Location: [facility];  Service: Vascular;  Laterality: Left;   THROMBECTOMY W/ EMBOLECTOMY Left 05/25/2018   Procedure: REPAIR OF BLEEDING ARTERIOVENOUS FISTULA;  Surgeon: Serafina Mitchell, MD;  [facility];  Service: Vascular;  Laterality: Left;   Family History  Family History  Problem Relation Age of Onset   Diabetes Mother    Hyperlipidemia Mother    Hypertension Mother    Heart disease Mother    Hypertension Other    Diabetes type II Other    Social History  reports that she is a non-smoker but has been exposed to tobacco smoke. She has never used  smokeless tobacco. She reports current drug use. Drug: Marijuana. She reports that she does not drink alcohol. Allergies  Allergies  Allergen Reactions   Morphine Shortness Of Breath and Anaphylaxis   Prednisone Other (See Comments)    Other reaction(s): Other (See Comments) Muscle spasms Patient says prednisone causes her to cramp all over, muscle spasms uncontrolled   Tuna [Fish Allergy] Itching, Swelling, Rash and Other (See Comments)    Face droops also   Amlodipine     Angioedema (09/05/17 ED visit)   Iodinated Diagnostic Agents Itching   Tape Itching    Adhesive tape   paper tape ok   Home medications Prior to Admission medications   Medication Sig Start Date End Date Taking?  Authorizing Provider  albuterol (PROVENTIL HFA;VENTOLIN HFA) 108 (90 Base) MCG/ACT inhaler Inhale 2 puffs into the lungs every 4 (four) hours as needed for wheezing or shortness of breath. 05/02/16   Forde Dandy, MD  alprazolam Duanne Moron) 2 MG tablet Take 2 mg by mouth 3 (three) times daily as needed for anxiety. 12/30/18   [provider]  AURYXIA 1 GM 210 MG(Fe) tablet Take 420 mg by mouth 3 (three) times daily. 01/02/21   [provider]  B Complex-C-Folic Acid (DIALYVITE 582) 0.8 MG WAFR Take 1 tablet by mouth at bedtime. 03/23/19   [provider]  calcium acetate (PHOSLO) 667 MG capsule Take 667-1,334 mg by mouth See admin instructions. 3 capsules with meals  1 to 2 capsules with snacks 11/30/17   [provider]  cinacalcet (SENSIPAR) 60 MG tablet Take 60 mg by mouth every evening.    [provider]  cyclobenzaprine (FLEXERIL) 10 MG tablet Take 10 mg by mouth 2 (two) times daily as needed for muscle spasms.    [provider]  diphenhydrAMINE (BENADRYL) 25 MG tablet Take 1 tablet (25 mg total) by mouth every 6 (six) hours. Patient taking differently: Take 25 mg by mouth every 6 (six) hours as needed for itching or allergies. 09/05/17   Domenic Moras, PA-C  NARCAN 4 MG/0.1ML LIQD nasal spray kit Place 1 spray into the nose as needed (accidental overdose). 11/14/18   [provider]  omeprazole (PRILOSEC) 20 MG capsule Take 20 mg by mouth daily as needed (acid reflux).  03/31/18   [provider]  ondansetron (ZOFRAN-ODT) 8 MG disintegrating tablet Take 8 mg by mouth every 8 (eight) hours as needed for nausea or vomiting.     [provider]  Oxycodone HCl 10 MG TABS Take 10 mg by mouth in the morning, at noon, and at bedtime. 05/17/19   [provider]  sevelamer carbonate (RENVELA) 800 MG tablet Take 1,600 mg by mouth 3 (three) times daily. 05/18/19   [provider]     Vitals:   02/15/21 2214 02/16/21  0240 02/16/21 0533 02/16/21 0936  BP:  134/73 140/86 (!) 156/92  Pulse:  67 86 65  Resp:  _0 Temp:   98.5 F (36.9 C)   TempSrc:      SpO2:  100% 98% 100%  Weight: 86.2 kg     Height: _1  (1.651 m)      Exam Gen alert, no distress, on room air, calm No rash, cyanosis or gangrene Sclera anicteric, throat clear  No jvd or bruits Chest clear bilat to bases, no rales/ wheezing RRR no MRG Abd soft, mod tender throughout, no rebound, no mass or ascites +bs GU deferred  MS no joint  effusions or deformity Ext no LE or UE edema, no wounds or ulcers Neuro is alert, Ox 3 , nf  LUA AVF +bruit     Home meds include - albuterol, xanax prn, auryxia 2 ac tid, phoslo 1-2 ac tid, sensipar 60 hs, renvela 2 ac tid, oxy IR prn, prilosec qd, prns/ vits/ supps     OP HD: AF TTS   3h 47mn   2/2 bath  LUA AVF - needs updated records     Assessment/ Plan: Gastroenteritis - possibly food-related. Per primary team.  ESRD - on HD TTS.  Missed last 2 HD sessions. Does not look uremic, no need for urgent HD today. Plan HD tomorrow am. Then again on Tuesday to get back on schedule.  HTN/ volume - not on any BP lowering meds at home. Pt looks euvolemic on exam today. BP's normal.  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 Get info from OP unit tomorrow. CCa wnl.  Anemia ckd - Hb 10-11 here. Follow.       RKelly Splinter MD 02/16/2021, 2:47 PM  Recent Labs  Lab 02/15/21 2233  WBC 7.1  HGB 10.6*   Recent Labs  Lab 02/15/21 2233  K 4.4  BUN 68*  CREATININE 20.45*  CALCIUM 8.6*

## 2021-02-16 NOTE — Plan of Care (Signed)
  Problem: Education: Goal: Knowledge of General Education information will improve Description: Including pain rating scale, medication(s)/side effects and non-pharmacologic comfort measures Outcome: Completed/Met   Problem: Coping: Goal: Level of anxiety will decrease Outcome: Completed/Met

## 2021-02-16 NOTE — H&P (Addendum)
Date: 02/16/2021               Patient Name:  Desiree [name] MRN: [id number]  DOB: 04-Apr-1975 Age / Sex: 45 y.o., female   PCP: Harvie Junior, MD         Medical Service: Internal Medicine Teaching Service         Attending Physician: Dr. Lucious Groves, DO    First Contact: Dr. Vinetta Bergamo Pager: [phone]  Second Contact: Dr. Allyson Sabal Pager: [phone]       After Hours (After 5p/  First Contact Pager: [phone]  weekends / holidays): Second Contact Pager: [phone]   Chief Complaint: N/V and abdominal pain  History of Present Illness:   Desiree [name] is a 45 year old female past medical history of ESRD (on HD, T/Th/Sa), hypertension, chronic pain on Oxycodone, recent admission for COVID-19 infection, who presents to the ED with c/o N/V and abdominal pain.   Desiree [name] states that on the evening of 12/2, around 9 PM, she ate an egg roll that was obtained earlier in the night by her [place].  Approximately 2 hours later, she developed generalized abdominal pain that was intermittent and severe.  When the pain occurred it was worse on the right side than the left.  On 12/3 at approximately 6 AM, she developed nausea with vomiting.  Emesis was nonbilious, nonbloody.  At the same time, she would have loose stools that were nonbloody, nonmelanotic.  In the last 24 hours, she has had approximately 8 episodes of vomiting and diarrhea.  Her abdominal pain has persisted but is still intermittent.  She denies any fever, chills, shortness of breath, peripheral edema.  She endorses chest pain that occurs with vomiting only.  She denies any sick contacts.  She does not know of anyone else from the festival experienced GI symptoms.  Desiree [name] states that she was unable to attend HD on 12/1 as it was rescheduled to 12/2, however she did not receive a call stating that she could come in.  She missed dialysis on 12/3 due to this.  No personal or family history of  IBD.   ED Course:  On arrival to the ED, patient's blood pressure was 133/74 with a heart rate of 71.  She was saturating at 100% on room air with a respiratory rate of 17.  Afebrile at 98.  Initial blood work shows stable hemoglobin at 10.6, elevated BUN at 68, creatinine of 20.4, lipase of 129.  CT abdomen/pelvis was obtained that demonstrated thickening of the distal small bowel loops with surrounding fluid.  No other acute findings.  Chest x-ray did not demonstrate any pulmonary edema.  IMTS was consulted for admission due to abdominal symptoms and need for HD.  Meds:  No current facility-administered medications on file prior to encounter.   Current Outpatient Medications on File Prior to Encounter  Medication Sig Dispense Refill   albuterol (PROVENTIL HFA;VENTOLIN HFA) 108 (90 Base) MCG/ACT inhaler Inhale 2 puffs into the lungs every 4 (four) hours as needed for wheezing or shortness of breath. 1 Inhaler 2   alprazolam (XANAX) 2 MG tablet Take 2 mg by mouth 3 (three) times daily as needed for anxiety.     AURYXIA 1 GM 210 MG(Fe) tablet Take 420 mg by mouth 3 (three) times daily.     B Complex-C-Folic Acid (DIALYVITE 233) 0.8 MG WAFR Take 1 tablet by mouth at bedtime.     calcium  acetate (PHOSLO) 667 MG capsule Take 667-1,334 mg by mouth See admin instructions. 3 capsules with meals  1 to 2 capsules with snacks  4   cinacalcet (SENSIPAR) 60 MG tablet Take 60 mg by mouth every evening.     cyclobenzaprine (FLEXERIL) 10 MG tablet Take 10 mg by mouth 2 (two) times daily as needed for muscle spasms.     diphenhydrAMINE (BENADRYL) 25 MG tablet Take 1 tablet (25 mg total) by mouth every 6 (six) hours. (Patient taking differently: Take 25 mg by mouth every 6 (six) hours as needed for itching or allergies.) 20 tablet 0   NARCAN 4 MG/0.1ML LIQD nasal spray kit Place 1 spray into the nose as needed (accidental overdose).     omeprazole (PRILOSEC) 20 MG capsule Take 20 mg by mouth daily as needed (acid  reflux).      ondansetron (ZOFRAN-ODT) 8 MG disintegrating tablet Take 8 mg by mouth every 8 (eight) hours as needed for nausea or vomiting.      Oxycodone HCl 10 MG TABS Take 10 mg by mouth in the morning, at noon, and at bedtime.     sevelamer carbonate (RENVELA) 800 MG tablet Take 1,600 mg by mouth 3 (three) times daily.     Allergies: Allergies as of 02/15/2021 - Review Complete 02/15/2021  Allergen Reaction Noted   Morphine Shortness Of Breath and Anaphylaxis 08/31/2013   Prednisone Other (See Comments) 01/05/2011   Blain Pais allergy] Itching, Swelling, Rash, and Other (See Comments) 09/14/2013   Amlodipine  12/09/2017   Iodinated diagnostic agents Itching 03/31/2012   Tape Itching 03/31/2012   Past Medical History:  Diagnosis Date   Anemia    Anxiety    panic attacks   Arthritis    bilateral knees   Childhood asthma    Complication of anesthesia    "sometimes it does not work; didn't during LEEP OR" (01/21/2016)   Depression    no med   ESRD (end stage renal disease) on dialysis ([place])    "TTS; [facility]; [facility]" (08/03/2016)   GERD (gastroesophageal reflux disease)    nexium prn   Gout    History of blood transfusion    "related to kidneys; I've had 4" (01/21/2016)   Hypertension    Migraine    last one 01/18/19   Preterm labor ~ 2014   Renal insufficiency    Retina hole, left    Seizures ([place])    "last one was in 2000; related to preeclampsia" (01/21/2016)   Family History:  Family History  Problem Relation Age of Onset   Diabetes Mother    Hyperlipidemia Mother    Hypertension Mother    Heart disease Mother    Hypertension Other    Diabetes type II Other    Social History:  - Desiree [name] lives in [place], [place]. - PCP: Dr. York Ram - Independent in all ADLs - Recreational use of marijuana, approximately every few days.  Both inhaled and ingested. - No other illicit drug use - Denies any tobacco or alcohol use  Review of  Systems: A complete ROS was negative except as per HPI.   Physical Exam: Blood pressure (!) 156/92, pulse 65, temperature 98.5 F (36.9 C), resp. rate 19, height _0  (1.651 m), weight 86.2 kg, last menstrual period 02/12/2021, SpO2 100 %.  Physical Exam Vitals and nursing note reviewed.  Constitutional:      Appearance: She is overweight.  HENT:     Mouth/Throat:  Pharynx: Oropharynx is clear.  Eyes:     Extraocular Movements: Extraocular movements intact.     Pupils: Pupils are equal, round, and reactive to light.  Cardiovascular:     Rate and Rhythm: Normal rate and regular rhythm.     Heart sounds: No murmur heard.   No gallop.  Pulmonary:     Effort: Pulmonary effort is normal. No respiratory distress.     Breath sounds: Normal breath sounds. No wheezing, rhonchi or rales.  Abdominal:     General: Bowel sounds are decreased. There is no distension.     Palpations: Abdomen is soft.     Tenderness: There is abdominal tenderness (Epigastric and RUQ predominantly. Some TTP in the RLQ). There is guarding (minimal).  Musculoskeletal:     Right lower leg: Edema (trace pitting) present.     Left lower leg: Edema (trace pitting) present.  Skin:    General: Skin is warm and dry.     Findings: No lesion or rash.  Neurological:     General: No focal deficit present.     Mental Status: She is alert and oriented to person, place, and time. Mental status is at baseline.  Psychiatric:        Mood and Affect: Mood normal.        Behavior: Behavior normal. Behavior is cooperative.        Thought Content: Thought content normal.        Judgment: Judgment normal.   EKG: personally reviewed my interpretation is: Sinus rhythm with rate of 67. No ST or T wave changes. No axis deviation.   CXR: personally reviewed my interpretation is: No focal opacities or pleural effusions noted.   Assessment & Plan by Problem: Principal Problem:   Acute gastroenteritis  Desiree [name] is a  45 year old female past medical history of ESRD (on HD, T/Th/Sa), hypertension, chronic pain on Oxycodone, recent admission for COVID-19 infection who presented to the ED with c/o N/V and abdominal pain, currently admitted for acute enteritis.   # Acute Enteritis Approximately 24 hour history of N/V/D with predominantly epigastric and RUQ abdominal pain with CT findings consistent with small bowel enteritis. Given rapid onset of symptoms after eating an egg roll from a festival, suspect bacterial gastroenteritis. Given lack of other systemic symptoms, viral gastroenteritis lower on the differential. Very low suspicion for IBD. Of note, differential initially included acute pancreatitis given location of pain, however lipase <3x ULN with normal imaging findings. Cholecystitis ruled out with RUQ ultrasound and normal LFTs. Euvolemic on examination currently, so will hold off on IVF resuscitation. No indications for antibiotics at this time.   - Zofran 4 mg q6h PRN for nausea - Fentanyl 12.5 mcg q2h PRN for breakthrough pain - GI pathogen panel   # ESRD on HD Initiated dialysis in 2010. 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 She missed last two sessions of dialysis, however no current indications for emergent dialysis.   - Nephrology consulted for management of HD  # Hx of Hypertension  Not currently on any anti-hypertensives. BP above goal however I suspect it will improve with HD.   # Anemia of Chronic Renal Disease  Hemoglobin stable at this time.   # Chronic Pain Syndrome  - Continue home Oxycodone  # Anxiety - Continue home Xanax   Diet:  Clear Liquid VTE: Heparin IVF: None,None Code: Full  Prior to Admission Living  Arrangement: Home, living with family Anticipated Discharge Location: Home Barriers to Discharge: Improvement in N/V, HD  Dispo: Admit patient to Observation  with expected length of stay less than 2 midnights.  Signed: Dr. Jose Persia Internal Medicine PGY-3 Pager: [phone] After 5pm on weekdays and 1pm on weekends: On Call pager [phone]  02/16/2021, 11:58 AM

## 2021-02-16 NOTE — Plan of Care (Signed)
  Problem: Education: Goal: Knowledge of General Education information will improve Description Including pain rating scale, medication(s)/side effects and non-pharmacologic comfort measures Outcome: Progressing   

## 2021-02-16 NOTE — Progress Notes (Signed)
New Admission Note:   Arrival Method: stretcher Mental Orientation: Alert and oriented  Telemetry:none  Assessment: Completed Skin: intact  IV: none  Pain: 8/10  Tubes: none  Safety Measures: Safety Fall Prevention Plan has been given, discussed and signed Admission: Completed [facility] Orientation: Patient has been orientated to the room, unit and staff.  Family: none   Orders have been reviewed and implemented. Will continue to monitor the patient. Call light has been placed within reach and bed alarm has been activated.   Isabell Bonafede RN [facility] Phone: [phone]

## 2021-02-17 DIAGNOSIS — K529 Noninfective gastroenteritis and colitis, unspecified: Secondary | ICD-10-CM

## 2021-02-17 DIAGNOSIS — N186 End stage renal disease: Secondary | ICD-10-CM | POA: Diagnosis not present

## 2021-02-17 DIAGNOSIS — Z992 Dependence on renal dialysis: Secondary | ICD-10-CM

## 2021-02-17 LAB — RENAL FUNCTION PANEL
Albumin: 3 g/dL — ABNORMAL LOW (ref 3.5–5.0)
Anion gap: 19 — ABNORMAL HIGH (ref 5–15)
BUN: 84 mg/dL — ABNORMAL HIGH (ref 6–20)
CO2: 22 mmol/L (ref 22–32)
Calcium: 7.9 mg/dL — ABNORMAL LOW (ref 8.9–10.3)
Chloride: 92 mmol/L — ABNORMAL LOW (ref 98–111)
Creatinine, Ser: 22.44 mg/dL — ABNORMAL HIGH (ref 0.44–1.00)
GFR, Estimated: 2 mL/min — ABNORMAL LOW (ref >60)
Glucose, Bld: 85 mg/dL (ref 70–99)
Phosphorus: 9.2 mg/dL — ABNORMAL HIGH (ref 2.5–4.6)
Potassium: 4.8 mmol/L (ref 3.5–5.1)
Sodium: 133 mmol/L — ABNORMAL LOW (ref 135–145)

## 2021-02-17 LAB — GASTROINTESTINAL PANEL BY PCR, STOOL (REPLACES STOOL CULTURE)

## 2021-02-17 LAB — CBC WITH DIFFERENTIAL/PLATELET
Abs Immature Granulocytes: 0.01 10*3/uL (ref 0.00–0.07)
Basophils Absolute: 0 10*3/uL (ref 0.0–0.1)
Basophils Relative: 1 %
Eosinophils Absolute: 0.5 10*3/uL (ref 0.0–0.5)
Eosinophils Relative: 8 %
HCT: 29.1 % — ABNORMAL LOW (ref 36.0–46.0)
Hemoglobin: 9.7 g/dL — ABNORMAL LOW (ref 12.0–15.0)
Immature Granulocytes: 0 %
Lymphocytes Relative: 43 %
Lymphs Abs: 2.6 10*3/uL (ref 0.7–4.0)
MCH: 30.9 pg (ref 26.0–34.0)
MCHC: 33.3 g/dL (ref 30.0–36.0)
MCV: 92.7 fL (ref 80.0–100.0)
Monocytes Absolute: 0.6 10*3/uL (ref 0.1–1.0)
Monocytes Relative: 10 %
Neutro Abs: 2.3 10*3/uL (ref 1.7–7.7)
Neutrophils Relative %: 38 %
Platelets: 160 10*3/uL (ref 150–400)
RBC: 3.14 MIL/uL — ABNORMAL LOW (ref 3.87–5.11)
RDW: 17.3 % — ABNORMAL HIGH (ref 11.5–15.5)
WBC: 5.9 10*3/uL (ref 4.0–10.5)
nRBC: 0 % (ref 0.0–0.2)

## 2021-02-17 NOTE — Discharge Instructions (Signed)
You were hospitalized for acute bacterial gastroenteritis (stomach flu) and missed hemodialysis sessions. Thank you for allowing [place] to be part of your care.   Please attend tomorrow's hemodialysis session.  Please continue to follow-up with your nephrologist and follow-up with your PCP after discharge.  No medication changes were made.

## 2021-02-17 NOTE — Discharge Summary (Signed)
Name: Desiree [name] MRN: [id number] DOB: Mar 08, 1976 45 y.o. PCP: Harvie Junior, MD  Date of Admission: 02/15/2021 10:00 PM Date of Discharge: 02/17/2021 Attending Physician: Lucious Groves, DO  Discharge Diagnosis: 1. Acute Enteritis 2. ESRD on HD  Discharge Medications: Allergies as of 02/17/2021       Reactions   Morphine Shortness Of Breath, Anaphylaxis   Prednisone Other (See Comments)   Other reaction(s): Other (See Comments) Muscle spasms Patient says prednisone causes her to cramp all over, muscle spasms uncontrolled   Tuna [fish Allergy] Itching, Swelling, Rash, Other (See Comments)   Face droops also   Amlodipine    Angioedema (09/05/17 ED visit)   Iodinated Diagnostic Agents Itching   Tape Itching   Adhesive tape   paper tape ok        Medication List     TAKE these medications    albuterol 108 (90 Base) MCG/ACT inhaler Commonly known as: VENTOLIN HFA Inhale 2 puffs into the lungs every 4 (four) hours as needed for wheezing or shortness of breath.   alprazolam 2 MG tablet Commonly known as: XANAX Take 2 mg by mouth 3 (three) times daily as needed for anxiety.   Auryxia 1 GM 210 MG(Fe) tablet Generic drug: ferric citrate Take 420 mg by mouth 3 (three) times daily.   calcium acetate 667 MG capsule Commonly known as: PHOSLO Take 667-1,334 mg by mouth See admin instructions. 3 capsules with meals  1 to 2 capsules with snacks   cinacalcet 60 MG tablet Commonly known as: SENSIPAR Take 60 mg by mouth every evening.   cyclobenzaprine 10 MG tablet Commonly known as: FLEXERIL Take 10 mg by mouth 2 (two) times daily as needed for muscle spasms.   Dialyvite 800 0.8 MG Wafr Take 1 tablet by mouth at bedtime.   diphenhydrAMINE 25 MG tablet Commonly known as: BENADRYL Take 1 tablet (25 mg total) by mouth every 6 (six) hours. What changed:  when to take this reasons to take this   Narcan 4 MG/0.1ML Liqd nasal spray kit Generic drug:  naloxone Place 1 spray into the nose as needed (accidental overdose).   omeprazole 20 MG capsule Commonly known as: PRILOSEC Take 20 mg by mouth daily as needed (acid reflux).   ondansetron 8 MG disintegrating tablet Commonly known as: ZOFRAN-ODT Take 8 mg by mouth every 8 (eight) hours as needed for nausea or vomiting.   Oxycodone HCl 10 MG Tabs Take 10 mg by mouth in the morning, at noon, and at bedtime.   sevelamer carbonate 800 MG tablet Commonly known as: RENVELA Take 1,600 mg by mouth 3 (three) times daily.        Disposition and follow-up:   Desiree [name] was discharged from [facility] in Good condition.  At the hospital follow up visit please address:  1. Acute Enteritis: No further vomiting or diarrhea.  Mild tenderness to palpation of abdomen, discussed abdominal pain should improve as she recovers.  Pain well managed on home oxycodone regimen for chronic pain.  Patient states she has Zofran at home to use for nausea as needed.  No medications prescribed at discharge. 2. ESRD on HD/missed HD sessions: Received inpatient hemodialysis on 12/5.  Patient will need to follow-up with outpatient HD.  She will resume her usual schedule tomorrow 12/6.  Labs / imaging needed at time of follow-up: BMP, Phos  Pending labs/ test needing follow-up: GI panel  Follow-up Appointments:  Follow-up Information  Harvie Junior, MD. Schedule an appointment as soon as possible for a visit in 2 week(s).   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   #ESRD on HD STT #History of missed HD sessions Initiated dialysis in 2010. 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 She missed last two sessions of dialysis, with past history of missed dialysis sessions, however no current indications for emergent dialysis.  Given patient on an ST-T schedule, and missed last 2 sessions, patient was admitted for inpatient hemodialysis.  No electrolyte abnormalities on admission.  Patient received inpatient hemodialysis on 12/5 and tolerated it well.  No further medical management was required.  Patient will need to attend tomorrow's hemodialysis session and continue on her regular HD schedule.   #Suspected Acute Bacterial Gastroenteritis Patient presented to ED on 12/4 with approximately 24 hour history of N/V/D with predominantly epigastric and RUQ abdominal pain with CT findings consistent with small bowel enteritis. Cholecystitis ruled out with RUQ ultrasound and normal LFTs. Differential initially included acute pancreatitis given location of pain, however lipase <3x ULN with normal pancreas imaging findings. Given rapid onset of symptoms after eating an egg roll from a festival, suspect bacterial gastroenteritis. Given lack of other systemic symptoms, viral gastroenteritis lower on the differential. Very low suspicion for IBD. Euvolemic on examination, therefore held off on IVF resuscitation. No indications for antibiotics. Patient was treated with supportive care with PRN Zofran.  The following morning patient reported no further emesis, no further diarrhea, with some residual abdominal pain that is well managed by home oxycodone medication.  She reports she has Zofran at home that she can use as needed.  Patient will need to follow-up with her PCP to follow-up on GI panel and any further supportive care needs.   # Hx of Hypertension  Not  currently on any anti-hypertensives.  Normotensive this admission.    # Anemia of Chronic Renal Disease  Hemoglobin stable at this time.  Baseline hemoglobin 9-11.   # Chronic Pain Syndrome  Patient was continued on home oxycodone regimen prescribed by Dr. Shela Leff who is her PCP.  No PCP documentation available for review.     # Anxiety Patient was continued on home Xanax prescribed by Dr. Shela Leff, PCP.  No PCP documentation available for review.  Subjective on day of discharge: No acute events overnight.  Patient was seen and examined in hemodialysis unit.  Patient reports continued abdominal pain.  Is still having some nausea.  Reports that she has not had vomiting or diarrhea since before admission.  Reports her pain is well controlled on home oxycodone regimen.  States that Zofran improves her nausea, and she has Zofran available at home that she can take as needed.  Patient is amendable to discharge today.  Discharge Exam:   BP 136/74 (BP Location: Right Arm)   Pulse 64   Temp 99.2 F (37.3 C) (Oral)   Resp (!) 24   Ht 5'  5" (1.651 m)   Wt 91.4 kg   LMP 02/12/2021   SpO2 100%   BMI 33.53 kg/m  Discharge exam: General: Well appearing African-American female, NAD HENT: normocephalic, atraumatic EYES: conjunctiva non-erythematous, no scleral icterus CV: regular rate, normal rhythm, no murmurs, rubs, gallops. Pulmonary: normal work of breathing on RA, lungs clear to auscultation, no rales, wheezes, rhonchi Abdominal: non-distended, soft, mild initial tenderness to palpation left lower quadrant which improved with continuous palpation, normal BS Skin: Warm and dry, no rashes or lesions Neurological: MS: awake, alert and oriented x3, normal speech and fund of knowledge Motor: moves all extremities antigravity Psych: normal affect   Pertinent Labs, Studies, and Procedures:  CBC Latest Ref Rng & Units 02/17/2021 02/15/2021 01/14/2021  WBC 4.0 - 10.5 K/uL 5.9 7.1 3.0(L)   Hemoglobin 12.0 - 15.0 g/dL 9.7(L) 10.6(L) 8.9(L)  Hematocrit 36.0 - 46.0 % 29.1(L) 32.3(L) 28.0(L)  Platelets 150 - 400 K/uL 160 193 154   CMP Latest Ref Rng & Units 02/17/2021 02/15/2021 01/14/2021  Glucose 70 - 99 mg/dL 85 82 99  BUN 6 - 20 mg/dL 84(H) 68(H) 63(H)  Creatinine 0.44 - 1.00 mg/dL 22.44(H) 20.45(H) 16.42(H)  Sodium 135 - 145 mmol/L 133(L) 136 135  Potassium 3.5 - 5.1 mmol/L 4.8 4.4 4.1  Chloride 98 - 111 mmol/L 92(L) 94(L) 94(L)  CO2 22 - 32 mmol/L _0 Calcium 8.9 - 10.3 mg/dL 7.9(L) 8.6(L) 8.3(L)  Total Protein 6.5 - 8.1 g/dL - 7.4 6.4(L)  Total Bilirubin 0.3 - 1.2 mg/dL - 0.5 0.6  Alkaline Phos 38 - 126 U/L - 44 34(L)  AST 15 - 41 U/L - 12(L) 12(L)  ALT 0 - 44 U/L - 9 8    CT Abdomen Pelvis Wo Contrast  Result Date: 02/15/2021 CLINICAL DATA:  Generalized abdominal pain for 2 days, initial encounter EXAM: CT ABDOMEN AND PELVIS WITHOUT CONTRAST TECHNIQUE: Multidetector CT imaging of the abdomen and pelvis was performed following the standard protocol without IV contrast. COMPARISON:  01/11/2021 FINDINGS: Lower chest: No acute abnormality. Hepatobiliary: No focal liver abnormality is seen. No gallstones, gallbladder wall thickening, or biliary dilatation. Pancreas: Unremarkable. No pancreatic ductal dilatation or surrounding inflammatory changes. Spleen: Normal in size without focal abnormality. Adrenals/Urinary Tract: Adrenal glands are within normal limits. Right kidney is shrunken but stable from the prior exam. Scattered cystic changes are noted. Cystic lesion with peripheral calcification is again identified in the lower pole of the left kidney stable in appearance from the prior study. No obstructive changes are seen. The bladder is decompressed. Stomach/Bowel: No obstructive or inflammatory changes of the colon are seen. The appendix is within normal limits. Stomach is decompressed. Some mildly thickened small bowel loops are identified in the right mid abdomen with  fluid and some mild surrounding inflammatory change. This may represent a degree of inflammatory bowel disease. These changes are new from the prior exam. Vascular/Lymphatic: Aortic atherosclerosis. No enlarged abdominal or pelvic lymph nodes. Reproductive: Uterus is within normal limits. There is a 4.4 cm cystic appearing lesion arising in the right adnexa somewhat enlarged when compared with the prior exam. This is incompletely characterized on noncontrast images. Other: No abdominal wall hernia or abnormality. No abdominopelvic ascites. Musculoskeletal: No acute or significant osseous findings. IMPRESSION: Thickening of multiple distal small bowel loops with fluid within which likely represents a degree of enteritis or possible early inflammatory bowel disease. Stable peripherally calcified cyst within the left kidney. 4.4 cm simple appearing cystic lesion within the right  adnexa. This is slightly more prominent than that seen on the prior exam at which time it measured approximately 3 cm. No follow-up imaging recommended. Note: This recommendation does not apply to premenarchal patients and to those with increased risk (genetic, family history, elevated tumor markers or other high-risk factors) of ovarian cancer. Reference: JACR 2020 Feb; 17(2):248-254 Electronically Signed   By: Inez Catalina M.D.   On: 02/15/2021 23:02   DG Chest 2 View  Result Date: 02/15/2021 CLINICAL DATA:  Missed dialysis EXAM: CHEST - 2 VIEW COMPARISON:  01/12/2021 FINDINGS: The heart size and mediastinal contours are within normal limits. Both lungs are clear. The visualized skeletal structures are unremarkable. IMPRESSION: No active cardiopulmonary disease. Electronically Signed   By: Ulyses Jarred M.D.   On: 02/15/2021 23:05   US Abdomen Limited RUQ (LIVER/GB)  Result Date: 02/16/2021 CLINICAL DATA:  Right upper quadrant and abdominal pain for 3 days. EXAM: ULTRASOUND ABDOMEN LIMITED RIGHT UPPER QUADRANT COMPARISON:  December 08, 2017 FINDINGS: Gallbladder: No gallstones or wall thickening visualized. No sonographic Murphy sign noted by sonographer. Common bile duct: Diameter: 3.9 mm Liver: No focal lesion identified. Within normal limits in parenchymal echogenicity. Portal vein is patent on color Doppler imaging with normal direction of blood flow towards the liver. Other: A trophic right kidney. IMPRESSION: Normal right upper quadrant ultrasound. Electronically Signed   By: Fidela Salisbury M.D.   On: 02/16/2021 13:16    Discharge Instructions: Discharge Instructions     Call MD for:  persistant nausea and vomiting   Complete by: As directed    Call MD for:  redness, tenderness, or signs of infection (pain, swelling, redness, odor or green/yellow discharge around incision site)   Complete by: As directed    Call MD for:  temperature >100.4   Complete by: As directed    No wound care   Complete by: As directed        Portions of this report may have been transcribed using voice recognition software. Every effort was made to ensure accuracy; however, inadvertent computerized transcription errors may be present.   Wayland Denis, MD 02/17/21,  12:40 PM Pager: [phone] Internal Medicine Resident, PGY-1 [facility]

## 2021-02-17 NOTE — Plan of Care (Signed)
  Problem: Health Behavior/Discharge Planning: Goal: Ability to manage health-related needs will improve Outcome: Completed/Met   Problem: Clinical Measurements: Goal: Ability to maintain clinical measurements within normal limits will improve Outcome: Completed/Met Goal: Will remain free from infection Outcome: Completed/Met Goal: Diagnostic test results will improve Outcome: Completed/Met Goal: Respiratory complications will improve Outcome: Completed/Met Goal: Cardiovascular complication will be avoided Outcome: Completed/Met   Problem: Activity: Goal: Risk for activity intolerance will decrease Outcome: Completed/Met   Problem: Nutrition: Goal: Adequate nutrition will be maintained Outcome: Completed/Met   Problem: Pain Managment: Goal: General experience of comfort will improve Outcome: Completed/Met   Problem: Education: Goal: Knowledge of disease and its progression will improve Outcome: Completed/Met Goal: Individualized Educational Video(s) Outcome: Completed/Met   Problem: Fluid Volume: Goal: Compliance with measures to maintain balanced fluid volume will improve Outcome: Completed/Met   Problem: Health Behavior/Discharge Planning: Goal: Ability to manage health-related needs will improve Outcome: Completed/Met   Problem: Nutritional: Goal: Ability to make healthy dietary choices will improve Outcome: Completed/Met   Problem: Clinical Measurements: Goal: Complications related to the disease process, condition or treatment will be avoided or minimized Outcome: Completed/Met

## 2021-02-17 NOTE — Procedures (Signed)
I was present at this dialysis session. I have reviewed the session itself and made appropriate changes.   LUE AVF.  2K bath. 1.5L UF goal.  Seems the N/V/D has improved overnight.  She hasn't done too much PO.  (But last night meal listed at 100% eaten)  After HD today no further inpatient renal needs, ok for discharge back to routine outpt HD tomorrow.    Filed Weights   02/15/21 2214 02/16/21 1546 02/17/21 0754  Weight: 86.2 kg 91.4 kg 91.4 kg    Recent Labs  Lab 02/17/21 0321  NA 133*  K 4.8  CL 92*  CO2 22  GLUCOSE 85  BUN 84*  CREATININE 22.44*  CALCIUM 7.9*  PHOS 9.2*    Recent Labs  Lab 02/15/21 2233 02/17/21 0321  WBC 7.1 5.9  NEUTROABS 3.7 2.3  HGB 10.6* 9.7*  HCT 32.3* 29.1*  MCV 93.4 92.7  PLT 193 160    Scheduled Meds:  heparin  5,000 Units Subcutaneous Q8H   Continuous Infusions: PRN Meds:.acetaminophen **OR** acetaminophen, albuterol, alprazolam, fentaNYL (SUBLIMAZE) injection, heparin, ondansetron **OR** ondansetron (ZOFRAN) IV, oxyCODONE, polyethylene glycol   Pearson Grippe  MD 02/17/2021, 8:42 AM

## 2021-02-17 NOTE — Progress Notes (Signed)
DISCHARGE NOTE HOME Desiree [name] to be discharged Home per MD order. Discussed prescriptions and follow up appointments with the patient. Prescriptions given to patient; medication list explained in detail. Patient verbalized understanding.  Skin clean, dry and intact without evidence of skin break down, no evidence of skin tears noted. IV catheter discontinued intact. Site without signs and symptoms of complications. Dressing and pressure applied. Pt denies pain at the site currently. No complaints noted.  Patient free of lines, drains, and wounds.   An After Visit Summary (AVS) was printed and given to the patient. Patient escorted via wheelchair, and discharged home via private auto.  Vira Agar, RN

## 2021-02-18 LAB — HEPATITIS B SURFACE ANTIBODY, QUANTITATIVE: Hep B S AB Quant (Post): 75.4 m[IU]/mL (ref >9.9)

## 2021-11-09 ENCOUNTER — Emergency Department (HOSPITAL_BASED_OUTPATIENT_CLINIC_OR_DEPARTMENT_OTHER): Payer: 59 | Admitting: Radiology

## 2021-11-09 ENCOUNTER — Emergency Department (HOSPITAL_BASED_OUTPATIENT_CLINIC_OR_DEPARTMENT_OTHER)
Admission: EM | Admit: 2021-11-09 | Discharge: 2021-11-09 | Disposition: A | Discharge status: Home / Self Care | Payer: 59 | Attending: Emergency Medicine | Admitting: Emergency Medicine

## 2021-11-09 ENCOUNTER — Encounter (HOSPITAL_BASED_OUTPATIENT_CLINIC_OR_DEPARTMENT_OTHER): Payer: Self-pay | Admitting: Emergency Medicine

## 2021-11-09 ENCOUNTER — Other Ambulatory Visit: Payer: Self-pay

## 2021-11-09 DIAGNOSIS — R0602 Shortness of breath: Secondary | ICD-10-CM | POA: Diagnosis present

## 2021-11-09 DIAGNOSIS — N186 End stage renal disease: Secondary | ICD-10-CM | POA: Insufficient documentation

## 2021-11-09 DIAGNOSIS — Z79899 Other long term (current) drug therapy: Secondary | ICD-10-CM | POA: Insufficient documentation

## 2021-11-09 DIAGNOSIS — Z20822 Contact with and (suspected) exposure to covid-19: Secondary | ICD-10-CM | POA: Insufficient documentation

## 2021-11-09 DIAGNOSIS — Z992 Dependence on renal dialysis: Secondary | ICD-10-CM | POA: Insufficient documentation

## 2021-11-09 LAB — BRAIN NATRIURETIC PEPTIDE: B Natriuretic Peptide: 314.9 pg/mL — ABNORMAL HIGH (ref 0.0–100.0)

## 2021-11-09 LAB — CBC WITH DIFFERENTIAL/PLATELET
Abs Immature Granulocytes: 0.02 10*3/uL (ref 0.00–0.07)
Basophils Absolute: 0 10*3/uL (ref 0.0–0.1)
Basophils Relative: 1 %
Eosinophils Absolute: 0.3 10*3/uL (ref 0.0–0.5)
Eosinophils Relative: 5 %
HCT: 25.7 % — ABNORMAL LOW (ref 36.0–46.0)
Hemoglobin: 8.1 g/dL — ABNORMAL LOW (ref 12.0–15.0)
Immature Granulocytes: 0 %
Lymphocytes Relative: 28 %
Lymphs Abs: 1.5 10*3/uL (ref 0.7–4.0)
MCH: 27.9 pg (ref 26.0–34.0)
MCHC: 31.5 g/dL (ref 30.0–36.0)
MCV: 88.6 fL (ref 80.0–100.0)
Monocytes Absolute: 0.6 10*3/uL (ref 0.1–1.0)
Monocytes Relative: 10 %
Neutro Abs: 3.1 10*3/uL (ref 1.7–7.7)
Neutrophils Relative %: 56 %
Platelets: 211 10*3/uL (ref 150–400)
RBC: 2.9 MIL/uL — ABNORMAL LOW (ref 3.87–5.11)
RDW: 18.9 % — ABNORMAL HIGH (ref 11.5–15.5)
WBC: 5.5 10*3/uL (ref 4.0–10.5)
nRBC: 0 % (ref 0.0–0.2)

## 2021-11-09 LAB — BASIC METABOLIC PANEL
Anion gap: 13 (ref 5–15)
BUN: 51 mg/dL — ABNORMAL HIGH (ref 6–20)
CO2: 32 mmol/L (ref 22–32)
Calcium: 10 mg/dL (ref 8.9–10.3)
Chloride: 94 mmol/L — ABNORMAL LOW (ref 98–111)
Creatinine, Ser: 12.52 mg/dL — ABNORMAL HIGH (ref 0.44–1.00)
GFR, Estimated: 3 mL/min — ABNORMAL LOW (ref >60)
Glucose, Bld: 85 mg/dL (ref 70–99)
Potassium: 4.2 mmol/L (ref 3.5–5.1)
Sodium: 139 mmol/L (ref 135–145)

## 2021-11-09 LAB — SARS CORONAVIRUS 2 BY RT PCR: SARS Coronavirus 2 by RT PCR: NEGATIVE

## 2021-11-09 LAB — MAGNESIUM: Magnesium: 2.2 mg/dL (ref 1.7–2.4)

## 2021-11-09 MED ORDER — OXYCODONE HCL 5 MG PO TABS
10.0000 mg | ORAL_TABLET | Freq: Once | ORAL | Status: AC
Start: 2021-11-09 — End: 2021-11-09
  Administered 2021-11-09: 10 mg via ORAL
  Filled 2021-11-09: qty 2

## 2021-11-09 MED ORDER — OXYCODONE HCL 5 MG PO TABS: 10.0000 mg | ORAL_TABLET | Freq: Four times a day (QID) | ORAL | 0 refills | Status: AC | PRN

## 2021-11-09 NOTE — ED Provider Notes (Signed)
[facility] DE[facility] Provider Note   CSN: [id number] Arrival date & time: 11/09/21  1609     History {Add pertinent medical, surgical, social history, OB history to HPI:1} Chief Complaint  Patient presents with   multiple complaints    Multiple complaints    Desiree [name] is a 46 y.o. female with history of end-stage renal disease on dialysis Tuesday Thursday Saturday presenting today with shortness of breath.  Patient ports she completed dialysis yesterday but has been feeling short of breath for 2 weeks.  Also feeling low energy and weak.  Feels that her legs are swelling.  She reports she has not missed dialysis lately.  She reports she has a dry cough.  HPI     Home Medications Prior to Admission medications   Medication Sig Start Date End Date Taking? Authorizing Provider  albuterol (PROVENTIL HFA;VENTOLIN HFA) 108 (90 Base) MCG/ACT inhaler Inhale 2 puffs into the lungs every 4 (four) hours as needed for wheezing or shortness of breath. 05/02/16   Forde Dandy, MD  alprazolam Duanne Moron) 2 MG tablet Take 2 mg by mouth 3 (three) times daily as needed for anxiety. 12/30/18   [provider]  AURYXIA 1 GM 210 MG(Fe) tablet Take 420 mg by mouth 3 (three) times daily. 01/02/21   [provider]  B Complex-C-Folic Acid (DIALYVITE 768) 0.8 MG WAFR Take 1 tablet by mouth at bedtime. 03/23/19   [provider]  calcium acetate (PHOSLO) 667 MG capsule Take 667-1,334 mg by mouth See admin instructions. 3 capsules with meals  1 to 2 capsules with snacks 11/30/17   [provider]  cinacalcet (SENSIPAR) 60 MG tablet Take 60 mg by mouth every evening.    [provider]  cyclobenzaprine (FLEXERIL) 10 MG tablet Take 10 mg by mouth 2 (two) times daily as needed for muscle spasms.    [provider]  diphenhydrAMINE (BENADRYL) 25 MG tablet Take 1 tablet (25 mg total) by mouth every 6 (six) hours. Patient taking differently:  Take 25 mg by mouth every 6 (six) hours as needed for itching or allergies. 09/05/17   Domenic Moras, PA-C  NARCAN 4 MG/0.1ML LIQD nasal spray kit Place 1 spray into the nose as needed (accidental overdose). 11/14/18   [provider]  omeprazole (PRILOSEC) 20 MG capsule Take 20 mg by mouth daily as needed (acid reflux).  03/31/18   [provider]  ondansetron (ZOFRAN-ODT) 8 MG disintegrating tablet Take 8 mg by mouth every 8 (eight) hours as needed for nausea or vomiting.     [provider]  Oxycodone HCl 10 MG TABS Take 10 mg by mouth in the morning, at noon, and at bedtime. 05/17/19   [provider]  sevelamer carbonate (RENVELA) 800 MG tablet Take 1,600 mg by mouth 3 (three) times daily. 05/18/19   [provider]      Allergies    Morphine, Prednisone, Blain Pais allergy], Amlodipine, Iodinated contrast media, and Tape    Review of Systems   Review of Systems  Physical Exam Updated Vital Signs BP (!) 156/82 (BP Location: Right Arm)   Pulse 87   Temp 99.1 F (37.3 C) (Oral)   Resp (!) 28   SpO2 94%  Physical Exam Constitutional:      General: She is not in acute distress.    Appearance: She is obese.  HENT:     Head: Normocephalic and atraumatic.  Eyes:     Conjunctiva/sclera: Conjunctivae normal.  Pupils: Pupils are equal, round, and reactive to light.  Cardiovascular:     Rate and Rhythm: Normal rate and regular rhythm.  Pulmonary:     Effort: Pulmonary effort is normal. No respiratory distress.     Comments: 90% on room air, RR 28 Fine crackles in lower lung fields Abdominal:     General: There is no distension.     Tenderness: There is no abdominal tenderness.  Musculoskeletal:        General: Swelling present.  Skin:    General: Skin is warm and dry.  Neurological:     General: No focal deficit present.     Mental Status: She is alert. Mental status is at baseline.  Psychiatric:        Mood and Affect: Mood normal.         Behavior: Behavior normal.     ED Results / Procedures / Treatments   Labs (all labs ordered are listed, but only abnormal results are displayed) Labs Reviewed - No data to display  EKG None  Radiology No results found.  Procedures Procedures  {Document cardiac monitor, telemetry assessment procedure when appropriate:1}  Medications Ordered in ED Medications - No data to display  ED Course/ Medical Decision Making/ A&P                           Medical Decision Making Amount and/or Complexity of Data Reviewed Labs: ordered. Radiology: ordered.   This patient presents to the ED with concern for breath, fatigue. This involves an extensive number of treatment options, and is a complaint that carries with it a high risk of complications and morbidity.  The differential diagnosis includes pulmonary edema versus pleural effusion versus pneumonia versus viral URI including COVID versus new onset congestive heart failure centimeters other  Co-morbidities that complicate the patient evaluation: History of end-stage renal disease at her complication for cardiorenal pulmonary conditions   External records from outside source obtained and reviewed including PDMP reviewed, the patient is prescribed oxycodone 10 mg as well as Xanax  I ordered and personally interpreted labs.  The pertinent results include:  ***  I ordered imaging studies including x-ray of the chest I independently visualized and interpreted imaging which showed *** I agree with the radiologist interpretation  The patient was maintained on a cardiac monitor.  I personally viewed and interpreted the cardiac monitored which showed an underlying rhythm of: ***  Per my interpretation the patient's ECG shows ***  I ordered medication including ***  for *** I have reviewed the patients home medicines and have made adjustments as needed  Test Considered: ***  I requested consultation with the ***,  and discussed  lab and imaging findings as well as pertinent plan - they recommend: ***  After the interventions noted above, I reevaluated the patient and found that they have: {resolved/improved/worsened:23923::"improved"}  Social Determinants of Health:***  Dispostion:  After consideration of the diagnostic results and the patients response to treatment, I feel that the patent would benefit from ***. She will  {Document critical care time when appropriate:1} {Document review of labs and clinical decision tools ie heart score, Chads2Vasc2 etc:1}  {Document your independent review of radiology images, and any outside records:1} {Document your discussion with family members, caretakers, and with consultants:1} {Document social determinants of health affecting pt's care:1} {Document your decision making why or why not admission, treatments were needed:1} Final Clinical Impression(s) / ED Diagnoses Final diagnoses:  None    Rx / DC Orders ED Discharge Orders     None

## 2021-11-09 NOTE — Discharge Instructions (Signed)
The cardiology clinic should call you within 3 business days to set up a follow-up appointment.  Your dialysis center nephrology clinic may consider drawing more fluid off of your lungs and your next dialysis session, they feel this is appropriate.  You do have some congestion and fluid in the lungs.

## 2021-11-09 NOTE — ED Notes (Signed)
ED Provider at bedside. 

## 2021-11-09 NOTE — ED Triage Notes (Signed)
Pt feels weak for 2 weeks, hard to walk because of fluid on legs and weak. Pt is esrd, she has been going to dialysis.

## 2021-11-09 NOTE — ED Triage Notes (Signed)
Pt  hasnt had her oxycodone in 2 weeks, has been on it for 15 years.

## 2021-12-04 ENCOUNTER — Encounter (HOSPITAL_BASED_OUTPATIENT_CLINIC_OR_DEPARTMENT_OTHER): Payer: Self-pay | Admitting: Cardiology

## 2021-12-04 ENCOUNTER — Other Ambulatory Visit: Payer: Self-pay

## 2021-12-04 ENCOUNTER — Inpatient Hospital Stay (HOSPITAL_BASED_OUTPATIENT_CLINIC_OR_DEPARTMENT_OTHER)
Admission: EM | Admit: 2021-12-04 | Discharge: 2021-12-06 | DRG: 189 | Disposition: A | Discharge status: Home / Self Care | Payer: 59 | Attending: Internal Medicine | Admitting: Internal Medicine

## 2021-12-04 ENCOUNTER — Telehealth: Payer: Self-pay | Admitting: Physician Assistant

## 2021-12-04 ENCOUNTER — Encounter (HOSPITAL_COMMUNITY): Payer: Self-pay

## 2021-12-04 ENCOUNTER — Ambulatory Visit (HOSPITAL_BASED_OUTPATIENT_CLINIC_OR_DEPARTMENT_OTHER)
Admission: RE | Admit: 2021-12-04 | Discharge: 2021-12-04 | Disposition: A | Discharge status: Home / Self Care | Payer: 59 | Source: Ambulatory Visit | Attending: Cardiology | Admitting: Cardiology

## 2021-12-04 ENCOUNTER — Encounter (HOSPITAL_BASED_OUTPATIENT_CLINIC_OR_DEPARTMENT_OTHER): Payer: Self-pay

## 2021-12-04 ENCOUNTER — Emergency Department (HOSPITAL_BASED_OUTPATIENT_CLINIC_OR_DEPARTMENT_OTHER): Payer: 59 | Admitting: Radiology

## 2021-12-04 ENCOUNTER — Ambulatory Visit (INDEPENDENT_AMBULATORY_CARE_PROVIDER_SITE_OTHER): Payer: 59 | Admitting: Cardiology

## 2021-12-04 VITALS — BP 169/95 | HR 85 | Ht 65.0 in | Wt 204.0 lb

## 2021-12-04 DIAGNOSIS — R0989 Other specified symptoms and signs involving the circulatory and respiratory systems: Secondary | ICD-10-CM | POA: Diagnosis not present

## 2021-12-04 DIAGNOSIS — J810 Acute pulmonary edema: Secondary | ICD-10-CM | POA: Diagnosis not present

## 2021-12-04 DIAGNOSIS — E8770 Fluid overload, unspecified: Secondary | ICD-10-CM | POA: Diagnosis present

## 2021-12-04 DIAGNOSIS — M109 Gout, unspecified: Secondary | ICD-10-CM | POA: Diagnosis present

## 2021-12-04 DIAGNOSIS — Z79899 Other long term (current) drug therapy: Secondary | ICD-10-CM

## 2021-12-04 DIAGNOSIS — Z91041 Radiographic dye allergy status: Secondary | ICD-10-CM

## 2021-12-04 DIAGNOSIS — R7981 Abnormal blood-gas level: Secondary | ICD-10-CM | POA: Diagnosis not present

## 2021-12-04 DIAGNOSIS — J9601 Acute respiratory failure with hypoxia: Secondary | ICD-10-CM | POA: Diagnosis not present

## 2021-12-04 DIAGNOSIS — E669 Obesity, unspecified: Secondary | ICD-10-CM | POA: Diagnosis present

## 2021-12-04 DIAGNOSIS — Z6833 Body mass index (BMI) 33.0-33.9, adult: Secondary | ICD-10-CM

## 2021-12-04 DIAGNOSIS — M1990 Unspecified osteoarthritis, unspecified site: Secondary | ICD-10-CM | POA: Diagnosis present

## 2021-12-04 DIAGNOSIS — Z5900 Homelessness unspecified: Secondary | ICD-10-CM

## 2021-12-04 DIAGNOSIS — J811 Chronic pulmonary edema: Secondary | ICD-10-CM | POA: Diagnosis present

## 2021-12-04 DIAGNOSIS — Z91013 Allergy to seafood: Secondary | ICD-10-CM

## 2021-12-04 DIAGNOSIS — R0602 Shortness of breath: Secondary | ICD-10-CM | POA: Diagnosis not present

## 2021-12-04 DIAGNOSIS — Z79891 Long term (current) use of opiate analgesic: Secondary | ICD-10-CM

## 2021-12-04 DIAGNOSIS — K219 Gastro-esophageal reflux disease without esophagitis: Secondary | ICD-10-CM | POA: Diagnosis present

## 2021-12-04 DIAGNOSIS — T82838A Hemorrhage of vascular prosthetic devices, implants and grafts, initial encounter: Secondary | ICD-10-CM | POA: Diagnosis present

## 2021-12-04 DIAGNOSIS — Y841 Kidney dialysis as the cause of abnormal reaction of the patient, or of later complication, without mention of misadventure at the time of the procedure: Secondary | ICD-10-CM | POA: Diagnosis present

## 2021-12-04 DIAGNOSIS — G8929 Other chronic pain: Secondary | ICD-10-CM | POA: Diagnosis present

## 2021-12-04 DIAGNOSIS — R9431 Abnormal electrocardiogram [ECG] [EKG]: Secondary | ICD-10-CM | POA: Diagnosis present

## 2021-12-04 DIAGNOSIS — Z885 Allergy status to narcotic agent status: Secondary | ICD-10-CM

## 2021-12-04 DIAGNOSIS — I120 Hypertensive chronic kidney disease with stage 5 chronic kidney disease or end stage renal disease: Secondary | ICD-10-CM | POA: Diagnosis present

## 2021-12-04 DIAGNOSIS — D631 Anemia in chronic kidney disease: Secondary | ICD-10-CM | POA: Diagnosis present

## 2021-12-04 DIAGNOSIS — I160 Hypertensive urgency: Secondary | ICD-10-CM | POA: Diagnosis present

## 2021-12-04 DIAGNOSIS — F419 Anxiety disorder, unspecified: Secondary | ICD-10-CM | POA: Diagnosis present

## 2021-12-04 DIAGNOSIS — Z992 Dependence on renal dialysis: Secondary | ICD-10-CM

## 2021-12-04 DIAGNOSIS — N186 End stage renal disease: Secondary | ICD-10-CM | POA: Diagnosis present

## 2021-12-04 DIAGNOSIS — Z8249 Family history of ischemic heart disease and other diseases of the circulatory system: Secondary | ICD-10-CM

## 2021-12-04 DIAGNOSIS — Z91048 Other nonmedicinal substance allergy status: Secondary | ICD-10-CM

## 2021-12-04 LAB — I-STAT ARTERIAL BLOOD GAS, ED
Acid-Base Excess: 6 mmol/L — ABNORMAL HIGH (ref 0.0–2.0)
Bicarbonate: 30.4 mmol/L — ABNORMAL HIGH (ref 20.0–28.0)
Calcium, Ion: 1.21 mmol/L (ref 1.15–1.40)
HCT: 25 % — ABNORMAL LOW (ref 36.0–46.0)
Hemoglobin: 8.5 g/dL — ABNORMAL LOW (ref 12.0–15.0)
O2 Saturation: 89 %
Patient temperature: 98.6
Potassium: 3.9 mmol/L (ref 3.5–5.1)
Sodium: 138 mmol/L (ref 135–145)
TCO2: 32 mmol/L (ref 22–32)
pCO2 arterial: 41.1 mmHg (ref 32–48)
pH, Arterial: 7.477 — ABNORMAL HIGH (ref 7.35–7.45)
pO2, Arterial: 53 mmHg — ABNORMAL LOW (ref 83–108)

## 2021-12-04 LAB — COMPREHENSIVE METABOLIC PANEL
ALT: 7 U/L (ref 0–44)
AST: 11 U/L — ABNORMAL LOW (ref 15–41)
Albumin: 3.6 g/dL (ref 3.5–5.0)
Alkaline Phosphatase: 36 U/L — ABNORMAL LOW (ref 38–126)
Anion gap: 15 (ref 5–15)
BUN: 40 mg/dL — ABNORMAL HIGH (ref 6–20)
CO2: 29 mmol/L (ref 22–32)
Calcium: 10.2 mg/dL (ref 8.9–10.3)
Chloride: 95 mmol/L — ABNORMAL LOW (ref 98–111)
Creatinine, Ser: 13.43 mg/dL — ABNORMAL HIGH (ref 0.44–1.00)
GFR, Estimated: 3 mL/min — ABNORMAL LOW (ref >60)
Glucose, Bld: 79 mg/dL (ref 70–99)
Potassium: 4.1 mmol/L (ref 3.5–5.1)
Sodium: 139 mmol/L (ref 135–145)
Total Bilirubin: 0.5 mg/dL (ref 0.3–1.2)
Total Protein: 7.2 g/dL (ref 6.5–8.1)

## 2021-12-04 LAB — CBC WITH DIFFERENTIAL/PLATELET
Abs Immature Granulocytes: 0.03 10*3/uL (ref 0.00–0.07)
Basophils Absolute: 0 10*3/uL (ref 0.0–0.1)
Basophils Relative: 1 %
Eosinophils Absolute: 0.3 10*3/uL (ref 0.0–0.5)
Eosinophils Relative: 4 %
HCT: 26.5 % — ABNORMAL LOW (ref 36.0–46.0)
Hemoglobin: 7.9 g/dL — ABNORMAL LOW (ref 12.0–15.0)
Immature Granulocytes: 0 %
Lymphocytes Relative: 26 %
Lymphs Abs: 1.8 10*3/uL (ref 0.7–4.0)
MCH: 26.8 pg (ref 26.0–34.0)
MCHC: 29.8 g/dL — ABNORMAL LOW (ref 30.0–36.0)
MCV: 89.8 fL (ref 80.0–100.0)
Monocytes Absolute: 0.4 10*3/uL (ref 0.1–1.0)
Monocytes Relative: 6 %
Neutro Abs: 4.3 10*3/uL (ref 1.7–7.7)
Neutrophils Relative %: 63 %
Platelets: 263 10*3/uL (ref 150–400)
RBC: 2.95 MIL/uL — ABNORMAL LOW (ref 3.87–5.11)
RDW: 18 % — ABNORMAL HIGH (ref 11.5–15.5)
WBC: 6.8 10*3/uL (ref 4.0–10.5)
nRBC: 0 % (ref 0.0–0.2)

## 2021-12-04 LAB — TROPONIN I (HIGH SENSITIVITY): Troponin I (High Sensitivity): 13 ng/L (ref <18)

## 2021-12-04 LAB — BRAIN NATRIURETIC PEPTIDE: B Natriuretic Peptide: 486.1 pg/mL — ABNORMAL HIGH (ref 0.0–100.0)

## 2021-12-04 LAB — LIPASE, BLOOD: Lipase: 84 U/L — ABNORMAL HIGH (ref 11–51)

## 2021-12-04 LAB — HCG, SERUM, QUALITATIVE: Preg, Serum: NEGATIVE

## 2021-12-04 MED ORDER — DIPHENHYDRAMINE HCL 25 MG PO CAPS
50.0000 mg | ORAL_CAPSULE | Freq: Once | ORAL | Status: AC
  Administered 2021-12-04: 50 mg via ORAL
  Filled 2021-12-04: qty 2

## 2021-12-04 MED ORDER — OXYCODONE HCL 5 MG PO TABS
10.0000 mg | ORAL_TABLET | Freq: Once | ORAL | Status: AC
  Administered 2021-12-04: 10 mg via ORAL
  Filled 2021-12-04: qty 2

## 2021-12-04 MED ORDER — METHYLPREDNISOLONE SODIUM SUCC 40 MG IJ SOLR
40.0000 mg | Freq: Once | INTRAMUSCULAR | Status: AC
  Administered 2021-12-04: 40 mg via INTRAVENOUS
  Filled 2021-12-04: qty 1

## 2021-12-04 MED ORDER — DIPHENHYDRAMINE HCL 50 MG/ML IJ SOLN
50.0000 mg | Freq: Once | INTRAMUSCULAR | Status: AC
  Administered 2021-12-05: 50 mg via INTRAVENOUS
  Filled 2021-12-04: qty 1

## 2021-12-04 MED ORDER — ALPRAZOLAM 0.5 MG PO TABS
1.0000 mg | ORAL_TABLET | Freq: Once | ORAL | Status: AC
  Administered 2021-12-04: 1 mg via ORAL
  Filled 2021-12-04: qty 2

## 2021-12-04 NOTE — ED Notes (Addendum)
Pt receiving numerous phone calls from family, spoke to dtg on the phone and requested they let her rest and one designated person call nurses station and nurse will report any new findings.   Pt crying and resp rate up to 40s, explained to pt she needs to rest Oxyir and xanax given per orders CT scan is scheduled for 2 am and per EDP pt will be admitted

## 2021-12-04 NOTE — Telephone Encounter (Signed)
Paged by CT department at [facility] after hours.  Patient is allergic to IV contrast.  Also allergic to prednisone.  She was hypoxic at room air at 72%.  Instructed to go to [facility] ER.

## 2021-12-04 NOTE — ED Notes (Signed)
RT obtained ABG on pt w/the following results. MD notified of results of ABG by RT.    Latest Reference Range & Units 12/04/21 21:12  Sample type  ARTERIAL  pH, Arterial 7.35 - 7.45  7.477 (H)  pCO2 arterial 32 - 48 mmHg 41.1  pO2, Arterial 83 - 108 mmHg 53 (L)  TCO2 22 - 32 mmol/L 32  Acid-Base Excess 0.0 - 2.0 mmol/L 6.0 (H)  Bicarbonate 20.0 - 28.0 mmol/L 30.4 (H)  O2 Saturation % 89  Patient temperature  98.6 F  Collection site  Brachial

## 2021-12-04 NOTE — Patient Instructions (Signed)
Medication Instructions:  Your physician recommends that you continue on your current medications as directed. Please refer to the Current Medication list given to you today.   Labwork: NONE   Testing/Procedures: CT downstairs today   Your physician has requested that you have an echocardiogram. Echocardiography is a painless test that uses sound waves to create images of your heart. It provides your doctor with information about the size and shape of your heart and how well your heart's chambers and valves are working. This procedure takes approximately one hour. There are no restrictions for this procedure.   Follow-Up: AFTER ECHO   Any Other Special Instructions Will Be Listed Below (If Applicable). ISABEL OUR SOCIAL WORKER WILL REACH OUT TO YOU   HOME OXYGEN HAS BEEN ORDERED. IF YOU DO NOT HEAR FROM THEM BY TOMORROW AFTERNOON CALL THE OFFICE TO FOLLOW UP   If you need a refill on your cardiac medications before your next appointment, please call your pharmacy.

## 2021-12-04 NOTE — ED Triage Notes (Addendum)
Pt states that she was supposed to have a CT angio chest today with contrast. Pt states that she was allergic to the dye, so they sent her over here to be pre-medicated.  Pt states she is supposed to have home oxygen, she has been [facility]. Pt states O2 sats were 73% - they are 100% in triage.

## 2021-12-04 NOTE — Progress Notes (Signed)
Cardiology Office Note:    Date:  12/04/2021   ID:  Desiree [name], DOB 1975/05/28, MRN [id number]  PCP:  Harvie Junior, MD  Cardiologist:  Buford Dresser, MD  Referring MD: Wyvonnia Dusky, MD   CC: new patient evaluation for shortness of breath  History of Present Illness:    Desiree [name] is a 46 y.o. female with a hx of hypertension, ESRD on dialysis, anemia, GERD, gout, and seizures, who is seen as a new consult at the request of Trifan, Carola Rhine, MD for the evaluation and management of shortness of breath.  She was seen in the ED 11/09/2021 for concerns of fatigue and shortness of breath. She also complained of low energy and weakness. She initially demonstrated borderline hypoxia with 02 saturation 90% while sleeping in the room. With repositioning and reassessment her oxygenation improved to 95%. She was referred to cardiology for consideration of echocardiogram and CHF evaluation.   Today she is accompanied by her wife, niece, and daughter. She has been struggling with her symptoms for 6 weeks.  Cardiovascular risk factors: Comorbid conditions: Hypertension, ESRD - She is on dialysis, currently 3 treatments a week. She states her BP is typically high when she first goes in for dialysis due to struggling for breath, but it will eventually improve. Recently her dry weight has been decreased several times, but she has not been feeling better aside from no longer feeling muscle cramps. She notes that her fluid is being removed. Her blood pressure is usually normalized after her dialysis treatment, but she still needs to sit in the car and catch her breath.  Tobacco use history: She has quit smoking. Family history: no significant history of CV disease Prior cardiac testing and/or incidental findings on other testing (ie coronary calcium): reviewed congestion on CXR Exercise level: She tried to walk the other day but was unable to complete her walk. Current diet: She  states that "she can barely eat or drink". At one time she tried eating and drinking nothing, but developed significantly worse LE edema. She endorses bilateral LE edema to her knees.  She has been on 3 L oxygen. She is constantly short of breath while talking. When in bed she needs to sit up as she is unable to lie flat. While walking with oxygen she may have slight chest pain. If she tries to walk without oxygen, she has significant chest pain, which she also describes as having a hole in her chest. Sometimes her pain radiates upward to her back and neck, with squeezing and stabbing pains.  Every night she will take Heparin and aspirin.   Reportedly she had intestinal prolapse last December.  She denies any palpitations, lightheadedness, headaches, syncope, or PND.    Past Medical History:  Diagnosis Date   Anemia    Anxiety    panic attacks   Arthritis    bilateral knees   Childhood asthma    Complication of anesthesia    "sometimes it does not work; didn't during LEEP OR" (01/21/2016)   Depression    no med   ESRD (end stage renal disease) on dialysis ([place])    "TTS; [facility]; [facility]" (08/03/2016)   GERD (gastroesophageal reflux disease)    nexium prn   Gout    History of blood transfusion    "related to kidneys; I've had 4" (01/21/2016)   Hypertension    Migraine    last one 01/18/19   Preterm labor ~ 2014  Renal insufficiency    Retina hole, left    Seizures ([place])    "last one was in 2000; related to preeclampsia" (01/21/2016)    Past Surgical History:  Procedure Laterality Date   AV FISTULA PLACEMENT Left 2010   AV FISTULA PLACEMENT Left 01/20/2019   Procedure: ARTERIOVENOUS (AV) FISTULA CREATION LEFT UPPER ARM;  Surgeon: Angelia Mould, MD;  Location: [facility];  Service: Vascular;  Laterality: Left;   CERVICAL BIOPSY  W/ LOOP ELECTRODE EXCISION  2001   DILATION AND EVACUATION  08/02/2011   Procedure: DILATATION AND EVACUATION;  Surgeon: Logan Bores, MD;  Location: [facility];  Service: Gynecology;;   DILATION AND EVACUATION N/A 08/31/2013   Procedure: DILATATION AND EVACUATION;  Surgeon: Woodroe Mode, MD;  Location: [facility];  Service: Gynecology;  Laterality: N/A;   FISTULA SUPERFICIALIZATION Left 01/20/2019   Procedure: Fistula Superficialization;  Surgeon: Angelia Mould, MD;  Location: [facility];  Service: Vascular;  Laterality: Left;   HYDRADENITIS EXCISION Right    RENAL BIOPSY     REVISION OF ARTERIOVENOUS GORETEX GRAFT Left 02/11/2013   Procedure: REVISION OF ARTERIOVENOUS GORTEX FISTULA;  Surgeon: Rosetta Posner, MD;  Location: [facility];  Service: Vascular;  Laterality: Left;   THROMBECTOMY W/ EMBOLECTOMY Left 05/25/2018   Procedure: REPAIR OF BLEEDING ARTERIOVENOUS FISTULA;  Surgeon: Serafina Mitchell, MD;  Location: [facility];  Service: Vascular;  Laterality: Left;    Current Medications: Current Outpatient Medications on File Prior to Visit  Medication Sig   albuterol (PROVENTIL HFA;VENTOLIN HFA) 108 (90 Base) MCG/ACT inhaler Inhale 2 puffs into the lungs every 4 (four) hours as needed for wheezing or shortness of breath.   ALPRAZolam (XANAX) 1 MG tablet Take 1 mg by mouth 3 (three) times daily as needed.   AURYXIA 1 GM 210 MG(Fe) tablet Take 420 mg by mouth 3 (three) times daily.   B Complex-C-Folic Acid (DIALYVITE 258) 0.8 MG WAFR Take 1 tablet by mouth at bedtime.   calcium acetate (PHOSLO) 667 MG capsule Take 667-1,334 mg by mouth See admin instructions. 3 capsules with meals  1 to 2 capsules with snacks   cinacalcet (SENSIPAR) 60 MG tablet Take 60 mg by mouth every evening.   cyclobenzaprine (FLEXERIL) 10 MG tablet Take 10 mg by mouth 2 (two) times daily as needed for muscle spasms.   diphenhydrAMINE (BENADRYL) 25 MG tablet Take 1 tablet (25 mg total) by mouth every 6 (six) hours. (Patient taking differently: Take 25 mg by mouth every 6 (six) hours as needed for itching or allergies.)   NARCAN 4 MG/0.1ML LIQD nasal spray  kit Place 1 spray into the nose as needed (accidental overdose).   omeprazole (PRILOSEC) 20 MG capsule Take 20 mg by mouth daily as needed (acid reflux).    ondansetron (ZOFRAN-ODT) 8 MG disintegrating tablet Take 8 mg by mouth every 8 (eight) hours as needed for nausea or vomiting.    Oxycodone HCl 10 MG TABS Take 10 mg by mouth in the morning, at noon, and at bedtime.   sevelamer carbonate (RENVELA) 800 MG tablet Take 1,600 mg by mouth 3 (three) times daily.   No current facility-administered medications on file prior to visit.     Allergies:   Morphine, Prednisone, Tuna [fish allergy], Amlodipine, Iodinated contrast media, and Tape   Social History   Tobacco Use   Smoking status: Never    Passive exposure: Yes   Smokeless tobacco: Never   Tobacco comments:  Husband Smokes  Vaping Use   Vaping Use: Never used  Substance Use Topics   Alcohol use: No   Drug use: Yes    Types: Marijuana    Comment: not recent    Family History: family history includes Diabetes in her mother; Diabetes type II in an other family member; Heart disease in her mother; Hyperlipidemia in her mother; Hypertension in her mother and another family member.  ROS:   Please see the history of present illness.  Additional pertinent ROS: Constitutional: Negative for chills, fever, night sweats, unintentional weight loss  HENT: Negative for ear pain and hearing loss.   Eyes: Negative for loss of vision and eye pain.  Respiratory: Negative for cough, sputum, wheezing. Positive for shortness of breath. Cardiovascular: See HPI. Gastrointestinal: Negative for abdominal pain, melena, and hematochezia.  Genitourinary: Negative for dysuria and hematuria.  Musculoskeletal: Negative for falls and myalgias.  Skin: Negative for itching and rash.  Neurological: Negative for focal weakness, focal sensory changes and loss of consciousness.  Endo/Heme/Allergies: Does not bruise/bleed easily.     EKGs/Labs/Other  Studies Reviewed:    The following studies were reviewed today:  LE Venous Doppler  01/14/2021 Summary:  BILATERAL:  - No evidence of deep vein thrombosis seen in the lower extremities,  bilaterally.  -No evidence of popliteal cyst, bilaterally.     CT Chest/Abdomen/Pelvis  01/11/2021: IMPRESSION: Chest Impression:   1. Mild pulmonary edema pattern in the lungs. 2. No focal consolidation   Abdomen / Pelvis Impression:   1. No acute findings abdomen pelvis. 2. Large benign appearing cyst of the LEFT kidney. 3. Bilateral atrophic kidneys. 4.  Aortic Atherosclerosis (ICD10-I70.0).   EKG:  EKG is personally reviewed.   12/04/2021:  NSR at 85 bpm  Recent Labs: 02/15/2021: ALT 9 11/09/2021: B Natriuretic Peptide 314.9; BUN 51; Creatinine, Ser 12.52; Hemoglobin 8.1; Magnesium 2.2; Platelets 211; Potassium 4.2; Sodium 139   Recent Lipid Panel No results found for: "CHOL", "TRIG", "HDL", "CHOLHDL", "VLDL", "LDLCALC", "LDLDIRECT"  Physical Exam:    VS:  BP (!) 169/95 (BP Location: Right Arm, Patient Position: Sitting, Cuff Size: Large)   Pulse 85   Ht 5' 5" (1.651 m)   Wt 204 lb (92.5 kg)   SpO2 99%   BMI 33.95 kg/m     Wt Readings from Last 3 Encounters:  12/04/21 204 lb (92.5 kg)  02/17/21 198 lb 13.7 oz (90.2 kg)  01/14/21 203 lb 7.8 oz (92.3 kg)    GEN: Well nourished, well developed. Conversationally dyspneic prior to O2, able to converse with 3L [place] HEENT: Normal, moist mucous membranes NECK: JVD elevated low neck CARDIAC: regular rhythm, normal S1 and S2, S3 appreciated, no rubs or gallops. No murmur. VASCULAR: Radial and DP pulses 2+ bilaterally. No carotid bruits RESPIRATORY:  On 3L oxygen. Diffusely diminished breath sounds throughout ABDOMEN: Soft, non-tender, non-distended MUSCULOSKELETAL:  Ambulates independently SKIN: Warm and dry, trace to 1+ LE pitting edema to the knees bilaterally NEUROLOGIC:  Alert and oriented x 3. No focal neuro deficits  noted. PSYCHIATRIC:  Normal affect    ASSESSMENT:    1. SOB (shortness of breath)   2. Low oxygen saturation   3. Acute hypoxemic respiratory failure (HCC)   4. Pulmonary vascular congestion    PLAN:    Hypoxia -6 week history -I reviewed her prior CXR. Has vascular congestion consistent with volume overload -she noted acute symptoms. She also describes easy clotting, frequently with clot attached to dialysis needles at the   end of her sessions -I recommended ER evaluation and admission. She feels that previously she was not listened to, and nothing was done while she was admitted. She also has personal family issues at this time -While not my preference, she is willing to do an outpatient evaluation. We will try to expedite this. Starting with CTPE, echo, and home O2. She notes itching/hives to prior contrast, has an allergy to prednisone. Has CT with contrast in 2015 without issues, does not appear she was premedicated. Given urgency, discussed premedication. She notes only itching and not anaphylaxis. Will get CT in ER so that if she has reaction she can be monitored. -will order home O2, as below -ordered outpatient echo  Did ambulatory O2 evaluation: Sitting, on room air: O2 94% Room air, walking at slow pace: Range 77-82% On 3L O2 by nasal cannula, sitting: 97% On 3L O2 by nasal cannula, walking: 94%  -Overall I think underdiuresis is likely contributing, given her elevated BP (reports intermittently requiring midodrine at dialysis in the past), but I cannot explain why she would suddenly have significant hypoxia. PE, lung disease, cardiac disease need to be excluded. She denies infectious symptoms  She is currently living in a motel but has housing pending. We will also ask our social work team for assistance.  Cardiac risk counseling and prevention recommendations: -recommend heart healthy/Mediterranean diet, with whole grains, fruits, vegetable, fish, lean meats, nuts, and  olive oil. Limit salt. -recommend moderate walking, 3-5 times/week for 30-50 minutes each session. Aim for at least 150 minutes.week. Goal should be pace of 3 miles/hours, or walking 1.5 miles in 30 minutes -recommend avoidance of tobacco products. Avoid excess alcohol. -ASCVD risk score: The ASCVD Risk score (Arnett DK, et al., 2019) failed to calculate for the following reasons:   Cannot find a previous HDL lab   Cannot find a previous total cholesterol lab    Plan for follow up: I have recommended ER presentation and admission, but given her prior experience and personal concerns, she would prefer to do workup as an outpatient. We extensively discussed red flag signs that need immediate medical attention. Will follow up closely.  High risk medical condition, discussed that it is potentially life threatening. She understands but declines ER visit/admission at this time.  Total time of encounter: 80 minutes total time of encounter, including 62 minutes spent in face-to-face patient care. This time includes coordination of care and counseling regarding above conditions. Remainder of non-face-to-face time involved reviewing chart documents/testing relevant to the patient encounter and documentation in the medical record.   , MD, PhD, FACC [place]  [facility]    Medication Adjustments/Labs and Tests Ordered: Current medicines are reviewed at length with the patient today.  Concerns regarding medicines are outlined above.   Orders Placed This Encounter  Procedures   For home use only DME oxygen   CT Angio Chest Pulmonary Embolism (PE) W or WO Contrast   EKG 12-Lead   ECHOCARDIOGRAM COMPLETE   No orders of the defined types were placed in this encounter.  Patient Instructions  Medication Instructions:  Your physician recommends that you continue on your current medications as directed. Please refer to the Current Medication list given to you today.    Labwork: NONE   Testing/Procedures: CT downstairs today   Your physician has requested that you have an echocardiogram. Echocardiography is a painless test that uses sound waves to create images of your heart. It provides your doctor with information about the size   and shape of your heart and how well your heart's chambers and valves are working. This procedure takes approximately one hour. There are no restrictions for this procedure.   Follow-Up: AFTER ECHO   Any Other Special Instructions Will Be Listed Below (If Applicable). ISABEL OUR SOCIAL WORKER WILL REACH OUT TO YOU   HOME OXYGEN HAS BEEN ORDERED. IF YOU DO NOT HEAR FROM THEM BY TOMORROW AFTERNOON CALL THE OFFICE TO FOLLOW UP   If you need a refill on your cardiac medications before your next appointment, please call your pharmacy.   I,Mathew Stumpf,acting as a scribe for  , MD.,have documented all relevant documentation on the behalf of  , MD,as directed by   , MD while in the presence of  , MD.  I,  , MD, have reviewed all documentation for this visit. The documentation on 12/05/21 for the exam, diagnosis, procedures, and orders are all accurate and complete.   Signed,  , MD PhD 12/04/2021     [facility]  

## 2021-12-04 NOTE — ED Notes (Signed)
CT aware of IV access, plan for CT @ 2am

## 2021-12-04 NOTE — ED Provider Notes (Signed)
[facility]ER[facility] DEPT Provider Note   CSN: [id number] Arrival date & time: 12/04/21  1818     History  Chief Complaint  Patient presents with   Pre Medication for CT    Desiree [name] is a 46 y.o. female.  Patient with history of ESRD on dialysis Tuesday, Thursday, Saturday, hypertension, seizures, anemia, migraine headaches presenting from cardiology clinic for CT angiogram chest.  She has been dealing with dyspnea on exertion for the past several weeks and tightness in her chest.  She was referred to have a CT angiogram of her chest but this was not able to be performed because of her IV contrast allergy which she was referred to the ED. Patient is quite sleepy after getting Benadryl at home in triage. She states has been having chest tightness and shortness of breath for several weeks worse with exertion.  She is found to be hypoxic in the cardiology office today and they are attempting to set her up with home oxygen but this is not confirmed yet.  She denies any abdominal pain, nausea or vomiting.  No fever.  Has had increased welling in her legs.  States he is due for some procedure to her fistula tomorrow because it has been bleeding excessively.  The history is provided by the patient.       Home Medications Prior to Admission medications   Medication Sig Start Date End Date Taking? Authorizing Provider  albuterol (PROVENTIL HFA;VENTOLIN HFA) 108 (90 Base) MCG/ACT inhaler Inhale 2 puffs into the lungs every 4 (four) hours as needed for wheezing or shortness of breath. 05/02/16   Forde Dandy, MD  ALPRAZolam Duanne Moron) 1 MG tablet Take 1 mg by mouth 3 (three) times daily as needed. 11/25/21   [provider]  AURYXIA 1 GM 210 MG(Fe) tablet Take 420 mg by mouth 3 (three) times daily. 01/02/21   [provider]  B Complex-C-Folic Acid (DIALYVITE 127) 0.8 MG WAFR Take 1 tablet by mouth at bedtime. 03/23/19   [provider]  calcium  acetate (PHOSLO) 667 MG capsule Take 667-1,334 mg by mouth See admin instructions. 3 capsules with meals  1 to 2 capsules with snacks 11/30/17   [provider]  cinacalcet (SENSIPAR) 60 MG tablet Take 60 mg by mouth every evening.    [provider]  cyclobenzaprine (FLEXERIL) 10 MG tablet Take 10 mg by mouth 2 (two) times daily as needed for muscle spasms.    [provider]  diphenhydrAMINE (BENADRYL) 25 MG tablet Take 1 tablet (25 mg total) by mouth every 6 (six) hours. Patient taking differently: Take 25 mg by mouth every 6 (six) hours as needed for itching or allergies. 09/05/17   Domenic Moras, PA-C  NARCAN 4 MG/0.1ML LIQD nasal spray kit Place 1 spray into the nose as needed (accidental overdose). 11/14/18   [provider]  omeprazole (PRILOSEC) 20 MG capsule Take 20 mg by mouth daily as needed (acid reflux).  03/31/18   [provider]  ondansetron (ZOFRAN-ODT) 8 MG disintegrating tablet Take 8 mg by mouth every 8 (eight) hours as needed for nausea or vomiting.     [provider]  Oxycodone HCl 10 MG TABS Take 10 mg by mouth in the morning, at noon, and at bedtime. 05/17/19   [provider]  sevelamer carbonate (RENVELA) 800 MG tablet Take 1,600 mg by mouth 3 (three) times daily. 05/18/19   [provider]      Allergies  Morphine, Prednisone, Tuna [fish allergy], Amlodipine, Iodinated contrast media, and Tape    Review of Systems   Review of Systems  Constitutional:  Negative for fever.  Respiratory:  Positive for cough, chest tightness and shortness of breath.   Genitourinary:  Negative for dysuria and hematuria.  Neurological:  Negative for headaches.   all other systems are negative except as noted in the HPI and PMH.    Physical Exam Updated Vital Signs BP (!) 165/72 (BP Location: Right Arm)   Pulse 91   Temp 98.6 F (37 C)   Resp 16   Ht $R'5\' 5"'tf$  (1.651 m)   Wt 92.5 kg   LMP 11/06/2021   SpO2 90%   BMI  33.95 kg/m  Physical Exam Vitals and nursing note reviewed.  Constitutional:      General: She is not in acute distress.    Appearance: She is well-developed. She is obese.     Comments: Somnolent, arouses to voice, answers questions slowly  HENT:     Head: Normocephalic and atraumatic.     Mouth/Throat:     Pharynx: No oropharyngeal exudate.  Eyes:     Conjunctiva/sclera: Conjunctivae normal.     Pupils: Pupils are equal, round, and reactive to light.  Neck:     Comments: No meningismus. Cardiovascular:     Rate and Rhythm: Normal rate and regular rhythm.     Heart sounds: Normal heart sounds. No murmur heard. Pulmonary:     Effort: Pulmonary effort is normal. No respiratory distress.     Breath sounds: Rales present.  Abdominal:     Palpations: Abdomen is soft.     Tenderness: There is no abdominal tenderness. There is no guarding or rebound.  Musculoskeletal:        General: No tenderness. Normal range of motion.     Cervical back: Normal range of motion and neck supple.     Right lower leg: Edema present.     Left lower leg: Edema present.     Comments: Dialysis fistula left forearm with thrill and bruit intact  Skin:    General: Skin is warm.  Neurological:     Mental Status: She is alert and oriented to person, place, and time.     Cranial Nerves: No cranial nerve deficit.     Motor: No abnormal muscle tone.     Coordination: Coordination normal.     Comments:  5/5 strength throughout. CN 2-12 intact.Equal grip strength.   Psychiatric:        Behavior: Behavior normal.     ED Results / Procedures / Treatments   Labs (all labs ordered are listed, but only abnormal results are displayed) Labs Reviewed  CBC WITH DIFFERENTIAL/PLATELET - Abnormal; Notable for the following components:      Result Value   RBC 2.95 (*)    Hemoglobin 7.9 (*)    HCT 26.5 (*)    MCHC 29.8 (*)    RDW 18.0 (*)    All other components within normal limits  COMPREHENSIVE METABOLIC  PANEL - Abnormal; Notable for the following components:   Chloride 95 (*)    BUN 40 (*)    Creatinine, Ser 13.43 (*)    AST 11 (*)    Alkaline Phosphatase 36 (*)    GFR, Estimated 3 (*)    All other components within normal limits  LIPASE, BLOOD - Abnormal; Notable for the following components:   Lipase 84 (*)    All other components within  normal limits  BRAIN NATRIURETIC PEPTIDE - Abnormal; Notable for the following components:   B Natriuretic Peptide 486.1 (*)    All other components within normal limits  I-STAT ARTERIAL BLOOD GAS, ED - Abnormal; Notable for the following components:   pH, Arterial 7.477 (*)    pO2, Arterial 53 (*)    Bicarbonate 30.4 (*)    Acid-Base Excess 6.0 (*)    HCT 25.0 (*)    Hemoglobin 8.5 (*)    All other components within normal limits  HCG, SERUM, QUALITATIVE  TROPONIN I (HIGH SENSITIVITY)  TROPONIN I (HIGH SENSITIVITY)    EKG None  Radiology DG Chest 2 View  Result Date: 12/04/2021 CLINICAL DATA:  Shortness of breath and lethargy. EXAM: CHEST - 2 VIEW COMPARISON:  PA Lat 11/09/2021 FINDINGS: There is mild cardiomegaly. There is interval increased central vascular engorgement and flow cephalization and increased generalized interstitial edema. There are small pleural effusions. Hazy perihilar infiltrates are noted radiating outward consistent with alveolar edema, less likely pneumonia. Thoracic cage is intact. IMPRESSION: Cardiomegaly with moderate findings of CHF or fluid overload. Small pleural effusions. Perihilar opacities extending outward consistent with alveolar edema, less likely pneumonia. Progress chest films recommended depending on clinical response. Electronically Signed   By: Telford Nab M.D.   On: 12/04/2021 20:41    Procedures .Critical Care  Performed by: Ezequiel Essex, MD Authorized by: Ezequiel Essex, MD   Critical care provider statement:    Critical care time (minutes):  45   Critical care time was exclusive of:   Separately billable procedures and treating other patients   Critical care was necessary to treat or prevent imminent or life-threatening deterioration of the following conditions:  Respiratory failure   Critical care was time spent personally by me on the following activities:  Development of treatment plan with patient or surrogate, discussions with consultants, evaluation of patient's response to treatment, examination of patient, ordering and review of laboratory studies, ordering and review of radiographic studies, ordering and performing treatments and interventions, pulse oximetry, re-evaluation of patient's condition, review of old charts and obtaining history from patient or surrogate   I assumed direction of critical care for this patient from another provider in my specialty: yes     Care discussed with: admitting provider       Medications Ordered in ED Medications  methylPREDNISolone sodium succinate (SOLU-MEDROL) 40 mg/mL injection 40 mg (has no administration in time range)  diphenhydrAMINE (BENADRYL) capsule 50 mg (has no administration in time range)    Or  diphenhydrAMINE (BENADRYL) injection 50 mg (has no administration in time range)    ED Course/ Medical Decision Making/ A&P                           Medical Decision Making Amount and/or Complexity of Data Reviewed Labs: ordered. Decision-making details documented in ED Course. Radiology: ordered and independent interpretation performed. Decision-making details documented in ED Course. ECG/medicine tests: ordered and independent interpretation performed. Decision-making details documented in ED Course.  Risk Prescription drug management. Decision regarding hospitalization.  ESRD patient with several weeks of dyspnea on exertion and shortness of breath.  Low oxygen level on arrival at 90%.  Does not wear oxygen at home.  Somnolent on exam.  X-ray consistent with pulmonary edema.  ABG was obtained given her  somnolence and does not show any significant CO2 retention.  Chest x-ray consistent with pulmonary edema.  Results reviewed and interpreted by  me.  Mental status has improved throughout ED course.  Believe patient's somnolence was due to Benadryl she had taken earlier.  Labs are reassuring with normal potassium.  Cardiology sent patient to the ED for CT PE study due to her contrast allergy.  It appears she is volume overloaded and likely has dyspnea on exertion secondary to that needs dialysis in the morning.  She has a new oxygen requirement and is requiring 3 L of oxygen to maintain her saturations above 85%.  She does not have oxygen at home but this was supposed to be arranged by cardiology earlier today but is not set up yet.  Hypertensive likely due to volume overload.  Does not have any blood pressure medications on her medication list.  PE is considered less likely at this time but we will proceed with CT scan per cardiology request.  She is premedicated with IV steroids and antihistamines.  Will require admission to the hospital nevertheless for hypoxic respiratory failure with new oxygen requirement and volume overload.  Discussed with Dr. Odessa Fleming who will arrange transfer to [facility].  CT PE study pending          Final Clinical Impression(s) / ED Diagnoses Final diagnoses:  Acute respiratory failure with hypoxia ([place])  Acute pulmonary edema ([place])    Rx / DC Orders ED Discharge Orders     None         Lacrecia Delval, Annie Main, MD 12/05/21 0002

## 2021-12-05 ENCOUNTER — Telehealth: Payer: Self-pay | Admitting: Licensed Clinical Social Worker

## 2021-12-05 ENCOUNTER — Encounter (HOSPITAL_COMMUNITY): Payer: Self-pay | Admitting: Family Medicine

## 2021-12-05 ENCOUNTER — Telehealth (HOSPITAL_BASED_OUTPATIENT_CLINIC_OR_DEPARTMENT_OTHER): Payer: Self-pay | Admitting: Cardiology

## 2021-12-05 ENCOUNTER — Emergency Department (HOSPITAL_BASED_OUTPATIENT_CLINIC_OR_DEPARTMENT_OTHER): Payer: 59

## 2021-12-05 DIAGNOSIS — J9601 Acute respiratory failure with hypoxia: Secondary | ICD-10-CM | POA: Diagnosis present

## 2021-12-05 DIAGNOSIS — N185 Chronic kidney disease, stage 5: Secondary | ICD-10-CM

## 2021-12-05 DIAGNOSIS — J810 Acute pulmonary edema: Secondary | ICD-10-CM

## 2021-12-05 DIAGNOSIS — Z91041 Radiographic dye allergy status: Secondary | ICD-10-CM | POA: Diagnosis not present

## 2021-12-05 DIAGNOSIS — D631 Anemia in chronic kidney disease: Secondary | ICD-10-CM | POA: Diagnosis present

## 2021-12-05 DIAGNOSIS — I160 Hypertensive urgency: Secondary | ICD-10-CM | POA: Diagnosis present

## 2021-12-05 DIAGNOSIS — Z91013 Allergy to seafood: Secondary | ICD-10-CM | POA: Diagnosis not present

## 2021-12-05 DIAGNOSIS — Z992 Dependence on renal dialysis: Secondary | ICD-10-CM | POA: Diagnosis not present

## 2021-12-05 DIAGNOSIS — K219 Gastro-esophageal reflux disease without esophagitis: Secondary | ICD-10-CM | POA: Diagnosis present

## 2021-12-05 DIAGNOSIS — R9431 Abnormal electrocardiogram [ECG] [EKG]: Secondary | ICD-10-CM | POA: Diagnosis present

## 2021-12-05 DIAGNOSIS — Z91048 Other nonmedicinal substance allergy status: Secondary | ICD-10-CM | POA: Diagnosis not present

## 2021-12-05 DIAGNOSIS — Z6833 Body mass index (BMI) 33.0-33.9, adult: Secondary | ICD-10-CM | POA: Diagnosis not present

## 2021-12-05 DIAGNOSIS — Z885 Allergy status to narcotic agent status: Secondary | ICD-10-CM | POA: Diagnosis not present

## 2021-12-05 DIAGNOSIS — F419 Anxiety disorder, unspecified: Secondary | ICD-10-CM | POA: Diagnosis present

## 2021-12-05 DIAGNOSIS — G8929 Other chronic pain: Secondary | ICD-10-CM | POA: Diagnosis present

## 2021-12-05 DIAGNOSIS — M1990 Unspecified osteoarthritis, unspecified site: Secondary | ICD-10-CM | POA: Diagnosis present

## 2021-12-05 DIAGNOSIS — Z5900 Homelessness unspecified: Secondary | ICD-10-CM | POA: Diagnosis not present

## 2021-12-05 DIAGNOSIS — E8770 Fluid overload, unspecified: Secondary | ICD-10-CM | POA: Diagnosis present

## 2021-12-05 DIAGNOSIS — Z8249 Family history of ischemic heart disease and other diseases of the circulatory system: Secondary | ICD-10-CM | POA: Diagnosis not present

## 2021-12-05 DIAGNOSIS — Z79891 Long term (current) use of opiate analgesic: Secondary | ICD-10-CM | POA: Diagnosis not present

## 2021-12-05 DIAGNOSIS — T82838A Hemorrhage of vascular prosthetic devices, implants and grafts, initial encounter: Secondary | ICD-10-CM | POA: Diagnosis present

## 2021-12-05 DIAGNOSIS — N186 End stage renal disease: Secondary | ICD-10-CM | POA: Diagnosis present

## 2021-12-05 DIAGNOSIS — Y841 Kidney dialysis as the cause of abnormal reaction of the patient, or of later complication, without mention of misadventure at the time of the procedure: Secondary | ICD-10-CM | POA: Diagnosis present

## 2021-12-05 DIAGNOSIS — I120 Hypertensive chronic kidney disease with stage 5 chronic kidney disease or end stage renal disease: Secondary | ICD-10-CM | POA: Diagnosis present

## 2021-12-05 DIAGNOSIS — Z79899 Other long term (current) drug therapy: Secondary | ICD-10-CM | POA: Diagnosis not present

## 2021-12-05 DIAGNOSIS — E669 Obesity, unspecified: Secondary | ICD-10-CM | POA: Diagnosis present

## 2021-12-05 DIAGNOSIS — M109 Gout, unspecified: Secondary | ICD-10-CM | POA: Diagnosis present

## 2021-12-05 HISTORY — DX: Hypertensive urgency: I16.0

## 2021-12-05 LAB — CBC
HCT: 25.1 % — ABNORMAL LOW (ref 36.0–46.0)
Hemoglobin: 7.8 g/dL — ABNORMAL LOW (ref 12.0–15.0)
MCH: 27.6 pg (ref 26.0–34.0)
MCHC: 31.1 g/dL (ref 30.0–36.0)
MCV: 88.7 fL (ref 80.0–100.0)
Platelets: 219 10*3/uL (ref 150–400)
RBC: 2.83 MIL/uL — ABNORMAL LOW (ref 3.87–5.11)
RDW: 17.3 % — ABNORMAL HIGH (ref 11.5–15.5)
WBC: 6 10*3/uL (ref 4.0–10.5)
nRBC: 0 % (ref 0.0–0.2)

## 2021-12-05 LAB — HEPATITIS B SURFACE ANTIGEN: Hepatitis B Surface Ag: NONREACTIVE

## 2021-12-05 LAB — BASIC METABOLIC PANEL
Anion gap: 17 — ABNORMAL HIGH (ref 5–15)
BUN: 45 mg/dL — ABNORMAL HIGH (ref 6–20)
CO2: 24 mmol/L (ref 22–32)
Calcium: 9.4 mg/dL (ref 8.9–10.3)
Chloride: 94 mmol/L — ABNORMAL LOW (ref 98–111)
Creatinine, Ser: 14.52 mg/dL — ABNORMAL HIGH (ref 0.44–1.00)
GFR, Estimated: 3 mL/min — ABNORMAL LOW (ref >60)
Glucose, Bld: 151 mg/dL — ABNORMAL HIGH (ref 70–99)
Potassium: 4.7 mmol/L (ref 3.5–5.1)
Sodium: 135 mmol/L (ref 135–145)

## 2021-12-05 LAB — IRON AND TIBC
Iron: 33 ug/dL (ref 28–170)
Saturation Ratios: 13 % (ref 10.4–31.8)
TIBC: 263 ug/dL (ref 250–450)
UIBC: 230 ug/dL

## 2021-12-05 LAB — PHOSPHORUS: Phosphorus: 7.7 mg/dL — ABNORMAL HIGH (ref 2.5–4.6)

## 2021-12-05 MED ORDER — FERRIC CITRATE 1 GM 210 MG(FE) PO TABS
630.0000 mg | ORAL_TABLET | Freq: Three times a day (TID) | ORAL | Status: DC
  Administered 2021-12-05 – 2021-12-06 (×2): 630 mg via ORAL
  Filled 2021-12-05 (×4): qty 3

## 2021-12-05 MED ORDER — LABETALOL HCL 5 MG/ML IV SOLN: 10.0000 mg | INTRAVENOUS | Status: DC | PRN

## 2021-12-05 MED ORDER — ACETAMINOPHEN 325 MG PO TABS: 650.0000 mg | ORAL_TABLET | Freq: Four times a day (QID) | ORAL | Status: DC | PRN

## 2021-12-05 MED ORDER — SODIUM CHLORIDE 0.9% FLUSH
3.0000 mL | INTRAVENOUS | Status: DC | PRN
  Administered 2021-12-05: 3 mL via INTRAVENOUS

## 2021-12-05 MED ORDER — HEPARIN SODIUM (PORCINE) 1000 UNIT/ML IJ SOLN
INTRAMUSCULAR | Status: AC
  Administered 2021-12-05: 2000 [IU]
  Filled 2021-12-05: qty 2

## 2021-12-05 MED ORDER — DOXERCALCIFEROL 4 MCG/2ML IV SOLN
10.0000 ug | INTRAVENOUS | Status: DC
  Filled 2021-12-05: qty 6

## 2021-12-05 MED ORDER — CHLORHEXIDINE GLUCONATE CLOTH 2 % EX PADS
6.0000 | MEDICATED_PAD | Freq: Every day | CUTANEOUS | Status: DC
  Administered 2021-12-05 – 2021-12-06 (×2): 6 via TOPICAL

## 2021-12-05 MED ORDER — ACETAMINOPHEN 650 MG RE SUPP: 650.0000 mg | Freq: Four times a day (QID) | RECTAL | Status: DC | PRN

## 2021-12-05 MED ORDER — HEPARIN SODIUM (PORCINE) 5000 UNIT/ML IJ SOLN
5000.0000 [IU] | Freq: Three times a day (TID) | INTRAMUSCULAR | Status: DC
  Administered 2021-12-05 – 2021-12-06 (×4): 5000 [IU] via SUBCUTANEOUS
  Filled 2021-12-05 (×4): qty 1

## 2021-12-05 MED ORDER — OXYCODONE HCL 5 MG PO TABS
ORAL_TABLET | ORAL | Status: AC
  Filled 2021-12-05: qty 1

## 2021-12-05 MED ORDER — ALPRAZOLAM 0.5 MG PO TABS
1.0000 mg | ORAL_TABLET | Freq: Three times a day (TID) | ORAL | Status: DC | PRN
  Administered 2021-12-05 (×2): 1 mg via ORAL
  Filled 2021-12-05 (×2): qty 2

## 2021-12-05 MED ORDER — CINACALCET HCL 30 MG PO TABS
60.0000 mg | ORAL_TABLET | Freq: Every day | ORAL | Status: DC
  Administered 2021-12-05: 60 mg via ORAL
  Filled 2021-12-05: qty 2

## 2021-12-05 MED ORDER — SODIUM CHLORIDE 0.9% FLUSH
3.0000 mL | Freq: Two times a day (BID) | INTRAVENOUS | Status: DC
  Administered 2021-12-05 – 2021-12-06 (×3): 3 mL via INTRAVENOUS

## 2021-12-05 MED ORDER — OXYCODONE HCL 5 MG PO TABS
10.0000 mg | ORAL_TABLET | Freq: Three times a day (TID) | ORAL | Status: DC | PRN
  Administered 2021-12-05 (×2): 10 mg via ORAL
  Filled 2021-12-05 (×2): qty 2

## 2021-12-05 MED ORDER — SODIUM CHLORIDE 0.9 % IV SOLN: 250.0000 mL | INTRAVENOUS | Status: DC | PRN

## 2021-12-05 MED ORDER — IOHEXOL 350 MG/ML SOLN
75.0000 mL | Freq: Once | INTRAVENOUS | Status: AC | PRN
  Administered 2021-12-05: 75 mL via INTRAVENOUS

## 2021-12-05 MED ORDER — CHLORHEXIDINE GLUCONATE CLOTH 2 % EX PADS
6.0000 | MEDICATED_PAD | Freq: Every day | CUTANEOUS | Status: DC
  Administered 2021-12-06: 6 via TOPICAL

## 2021-12-05 NOTE — Consult Note (Signed)
Renal Service Consult Note [name]  Desiree [name] 12/05/2021 Sol Blazing, MD Requesting Physician: Dr. Waldron Labs  Reason for Consult: ESRD pt w/ SOB, pulm edema HPI: The patient is a 46 y.o. year-old w/ hx of anemia, anxiety, DJD, depression, ESRD on HD, gout, HTN, migraines, h/o seizures who presented to ED yesterday w/ SOB. In ED BP's were moderately high, HR good and RR 19, afeb. CXR showed moderate CHF/ fluid overload. She did miss her HD on Thursday and we scheduled to have HD on Friday 9/22. She showed up to ER though for SOB as above. Pt admitted, on [facility], we are asked to see for dialysis.   Pt states has been homeless for 5 months since April.  Per pt she notes that the NP at her HD unit suspects she is losing body wt, because she will come in at her dry wt or just slightly above but still having SOB and swelling. Her dry wt has been lowered in the past few weeks once or poss twice.  Denies any cough, fevers or chills, no n/v/d.   Pt also c/o her buttonhole will sometimes bleed at night, sometimes assoc w/ coughing or sneezing, but sometimes she will wake up and have blood splattered about.    ROS - denies CP, no joint pain, no HA, no blurry vision, no rash, no diarrhea, no nausea/ vomitin   Past Medical History  Past Medical History:  Diagnosis Date   Anemia    Anxiety    panic attacks   Arthritis    bilateral knees   Childhood asthma    Complication of anesthesia    "sometimes it does not work; didn't during LEEP OR" (01/21/2016)   Depression    no med   ESRD (end stage renal disease) on dialysis ([place])    "TTS; [facility]; [facility]" (08/03/2016)   GERD (gastroesophageal reflux disease)    nexium prn   Gout    History of blood transfusion    "related to kidneys; I've had 4" (01/21/2016)   Hypertension    Migraine    last one 01/18/19   Preterm labor ~ 2014   Renal insufficiency    Retina hole, left    Seizures ([place])    "last one  was in 2000; related to preeclampsia" (01/21/2016)   Past Surgical History  Past Surgical History:  Procedure Laterality Date   AV FISTULA PLACEMENT Left 2010   AV FISTULA PLACEMENT Left 01/20/2019   Procedure: ARTERIOVENOUS (AV) FISTULA CREATION LEFT UPPER ARM;  Surgeon: Angelia Mould, MD;  Location: [facility];  Service: Vascular;  Laterality: Left;   CERVICAL BIOPSY  W/ LOOP ELECTRODE EXCISION  2001   DILATION AND EVACUATION  08/02/2011   Procedure: DILATATION AND EVACUATION;  Surgeon: Logan Bores, MD;  Location: [facility];  Service: Gynecology;;   DILATION AND EVACUATION N/A 08/31/2013   Procedure: DILATATION AND EVACUATION;  Surgeon: Woodroe Mode, MD;  Location: [facility];  Service: Gynecology;  Laterality: N/A;   FISTULA SUPERFICIALIZATION Left 01/20/2019   Procedure: Fistula Superficialization;  Surgeon: Angelia Mould, MD;  Location: [facility];  Service: Vascular;  Laterality: Left;   HYDRADENITIS EXCISION Right    RENAL BIOPSY     REVISION OF ARTERIOVENOUS GORETEX GRAFT Left 02/11/2013   Procedure: REVISION OF ARTERIOVENOUS GORTEX FISTULA;  Surgeon: Rosetta Posner, MD;  Location: [facility];  Service: Vascular;  Laterality: Left;   THROMBECTOMY W/ EMBOLECTOMY Left 05/25/2018   Procedure:  REPAIR OF BLEEDING ARTERIOVENOUS FISTULA;  Surgeon: Serafina Mitchell, MD;  Location: [facility];  Service: Vascular;  Laterality: Left;   Family History  Family History  Problem Relation Age of Onset   Diabetes Mother    Hyperlipidemia Mother    Hypertension Mother    Heart disease Mother    Hypertension Other    Diabetes type II Other    Social History  reports that she has never smoked. She has been exposed to tobacco smoke. She has never used smokeless tobacco. She reports current drug use. Drug: Marijuana. She reports that she does not drink alcohol. Allergies  Allergies  Allergen Reactions   Morphine Shortness Of Breath and Anaphylaxis   Prednisone Other (See Comments)    Other  reaction(s): Other (See Comments) Muscle spasms Patient says prednisone causes her to cramp all over, muscle spasms uncontrolled   Tuna [Fish Allergy] Itching, Swelling, Rash and Other (See Comments)    Face droops also   Amlodipine     Angioedema (09/05/17 ED visit)   Iodinated Contrast Media Itching   Tape Itching    Adhesive tape   paper tape ok   Home medications Prior to Admission medications   Medication Sig Start Date End Date Taking? Authorizing Provider  albuterol (PROVENTIL HFA;VENTOLIN HFA) 108 (90 Base) MCG/ACT inhaler Inhale 2 puffs into the lungs every 4 (four) hours as needed for wheezing or shortness of breath. 05/02/16   Forde Dandy, MD  ALPRAZolam Duanne Moron) 1 MG tablet Take 1 mg by mouth 3 (three) times daily as needed. 11/25/21   [provider]  AURYXIA 1 GM 210 MG(Fe) tablet Take 420 mg by mouth 3 (three) times daily. 01/02/21   [provider]  B Complex-C-Folic Acid (DIALYVITE 412) 0.8 MG WAFR Take 1 tablet by mouth at bedtime. 03/23/19   [provider]  calcium acetate (PHOSLO) 667 MG capsule Take 667-1,334 mg by mouth See admin instructions. 3 capsules with meals  1 to 2 capsules with snacks 11/30/17   [provider]  cinacalcet (SENSIPAR) 60 MG tablet Take 60 mg by mouth every evening.    [provider]  cyclobenzaprine (FLEXERIL) 10 MG tablet Take 10 mg by mouth 2 (two) times daily as needed for muscle spasms.    [provider]  diphenhydrAMINE (BENADRYL) 25 MG tablet Take 1 tablet (25 mg total) by mouth every 6 (six) hours. Patient taking differently: Take 25 mg by mouth every 6 (six) hours as needed for itching or allergies. 09/05/17   Domenic Moras, PA-C  NARCAN 4 MG/0.1ML LIQD nasal spray kit Place 1 spray into the nose as needed (accidental overdose). 11/14/18   [provider]  omeprazole (PRILOSEC) 20 MG capsule Take 20 mg by mouth daily as needed (acid reflux).  03/31/18   [provider]   ondansetron (ZOFRAN-ODT) 8 MG disintegrating tablet Take 8 mg by mouth every 8 (eight) hours as needed for nausea or vomiting.     [provider]  Oxycodone HCl 10 MG TABS Take 10 mg by mouth in the morning, at noon, and at bedtime. 05/17/19   [provider]  sevelamer carbonate (RENVELA) 800 MG tablet Take 1,600 mg by mouth 3 (three) times daily. 05/18/19   [provider]     Vitals:   12/05/21 0957 12/05/21 1015 12/05/21 1032 12/05/21 1100  BP: (!) 152/72 (!) 154/78 (!) 183/91 (!) 171/70  Pulse: 79 82 82 91  Resp: (!) 28 (!) 34 (!)  26 (!) 22  Temp: 98.5 F (36.9 C)     TempSrc: Oral     SpO2: 100% 100% 100% 100%  Weight: 92.9 kg     Height:       Exam Gen alert, no distress No rash, cyanosis or gangrene Sclera anicteric, throat clear  ++jvd or bruits Chest bilat basilar rales 1/3 up RRR no MRG Abd soft ntnd no mass or ascites +bs GU defer MS no joint effusions or deformity Ext trace LE edema, some facial edema Neuro is alert, Ox 3 , nf    LUA AVF+bruit, no lesions or ulcerations, +buttonholes   OP HD: SW TTS 3h 16min  400/600   88kg 2/2 bath  P4  LUA AVF  (buttonhole)  Hep 6600 - mircera 225 q2, last 9/12, due 9/26 - hectorol 10 ug IV tiw - last HD 9/19, post wt 88.8kg, has been below dry wt recently - sensipar 60 hs, auryxia 3 ac - outpt hep B Ab was+ immune at OP unit in Aug - hep B Ag neg here   Assessment/ Plan: SOB/ vol overload - pulm edema on CXR. Pt is homeless and continues to lose body wt, will need another lowering of her dry wt. Plan acute HD this am w/ max UF. Plan HD again tomorrow w/ further max UF.  Bleeding AVF - from buttonholes, happening overnight while sleeping, assoc w/ coughing or sneezing. Have called consult for [facility] to see.  ESRD - on HD TTS. HD today off schedule and HD tomorrow.  HTN - cont any BP lowering meds Anemia esrd - Hb 7.8, next esa due on 9/26. Follow, get fe / tibc, transfuse prn.  MBD ckd - CCa in  range, add on phos. Cont binders, sensipar qd and IV vdra w hd.    Rob Milinda Sweeney  MD 12/05/2021, 11:07 AM Recent Labs  Lab 12/04/21 2150 12/05/21 0917  HGB 7.9* 7.8*  ALBUMIN 3.6  --   CALCIUM 10.2 9.4  CREATININE 13.43* 14.52*  K 4.1 4.7   Inpatient medications:  Chlorhexidine Gluconate Cloth  6 each Topical Q0600   heparin  5,000 Units Subcutaneous Q8H   oxyCODONE       sodium chloride flush  3 mL Intravenous Q12H    sodium chloride     sodium chloride, acetaminophen **OR** acetaminophen, ALPRAZolam, labetalol, oxyCODONE, oxyCODONE, sodium chloride flush

## 2021-12-05 NOTE — Plan of Care (Signed)
  Problem: Education: Goal: Knowledge of General Education information will improve Description Including pain rating scale, medication(s)/side effects and non-pharmacologic comfort measures Outcome: Progressing   Problem: Health Behavior/Discharge Planning: Goal: Ability to manage health-related needs will improve Outcome: Progressing   

## 2021-12-05 NOTE — Progress Notes (Signed)
PROGRESS NOTE    Desiree [name]  OBS:[id number] DOB: [birth year] DOA: 12/04/2021 PCP: Harvie Junior, MD    Chief Complaint  Patient presents with   Pre Medication for CT    Brief Narrative:  This is a no charge note as patient was seen and admitted earlier today by Dr. Myna Hidalgo, chart, imaging and labs were reviewed, patient was seen and examined.  HPI: Desiree [name] is a 46 y.o. female with medical history significant for ESRD on hemodialysis, anxiety, and hypertension, now presenting to the emergency department for evaluation of hypoxia.   Patient reports that she has been experiencing shortness of breath for the past 6 weeks despite her dry weight being lowered multiple times.  She denies any associated fever, chills, or sputum production.  She was evaluated by cardiology in the clinic yesterday for the symptoms, was sent for CTA chest, was being scheduled for echocardiogram, and home oxygen was being arranged.   When she presented for her CT angio, it was noted that she has contrast allergy and was hypoxic.  She was sent to the ED due to this.   [facility] ED Course: Upon arrival to the ED, patient is found to be afebrile and saturating well on 2 to 4 L/min of supplemental oxygen with normal heart rate and elevated blood pressure.  CTA chest is negative for PE but notable for pulmonary edema with small to moderate bilateral pleural effusions.  Patient was treated with Xanax and oxycodone in the ED and transferred to [facility] for admission.  Assessment & Plan:   Principal Problem:   Acute respiratory failure with hypoxia (HCC) Active Problems:   Anemia of chronic kidney failure   Chronic pain   ESRD (end stage renal disease) on dialysis [facility])   Pulmonary edema   Hypertensive urgency   Anxiety   Prolonged QT interval   1. Acute hypoxic respiratory failure; pulmonary edema  - Presented for outpatient CTA chest to evaluate her SOB but was found to have O2  saturations in 70s and was directed to the ED  - She has pulmonary edema on imaging, PE ruled-out by CTA chest, no infectious s/s  - Continue supplemental O2, restrict fluids, -Nephrology consulted, for HD today   2. ESRD  - Last dialyzed 12/02/21  - She is hypervolemic on admission with hypoxia but speaking full sentences  -Renal consulted   3. Hypertensive urgency  - Anticipate improvement with HD, use as-needed labetalol for now    4. Chronic pain  - Prescription database reviewed, continue home regimen     5. Anxiety  - Continue as-needed Xanax     6. Anemia  - No overt bleeding, appears close to baseline        DVT prophylaxis: Heparin Code Status: Full Family Communication: None at bedside Disposition:   Status is: Observation  Consultants:  Vascular surgery Nephrology  Subjective:  She reports mild dyspnea, no fever or chills  Objective: Vitals:   12/05/21 1015 12/05/21 1032 12/05/21 1100 12/05/21 1130  BP: (!) 154/78 (!) 183/91 (!) 171/70 (!) 181/84  Pulse: 82 82 91 88  Resp: (!) 34 (!) 26 (!) 22 (!) 33  Temp:      TempSrc:      SpO2: 100% 100% 100% 100%  Weight:      Height:       No intake or output data in the 24 hours ending 12/05/21 1206 Filed Weights   12/04/21 1824 12/05/21 0430 12/05/21  0957  Weight: 92.5 kg 92.7 kg 92.9 kg    Examination:  Patient appears comfortable, but she is mildly tachypneic, requiring 3 L nasal cannula, with crackles at lung bases    Data Reviewed: I have personally reviewed following labs and imaging studies  CBC: Recent Labs  Lab 12/04/21 2112 12/04/21 2150 12/05/21 0917  WBC  --  6.8 6.0  NEUTROABS  --  4.3  --   HGB 8.5* 7.9* 7.8*  HCT 25.0* 26.5* 25.1*  MCV  --  89.8 88.7  PLT  --  263 270    Basic Metabolic Panel: Recent Labs  Lab 12/04/21 2112 12/04/21 2150 12/05/21 0917  NA 138 139 135  K 3.9 4.1 4.7  CL  --  95* 94*  CO2  --  29 24  GLUCOSE  --  79 151*  BUN  --  40* 45*   CREATININE  --  13.43* 14.52*  CALCIUM  --  10.2 9.4    GFR: Estimated Creatinine Clearance: 5.5 mL/min (A) (by C-G formula based on SCr of 14.52 mg/dL (H)).  Liver Function Tests: Recent Labs  Lab 12/04/21 2150  AST 11*  ALT 7  ALKPHOS 36*  BILITOT 0.5  PROT 7.2  ALBUMIN 3.6    CBG: No results for input(s): "GLUCAP" in the last 168 hours.   No results found for this or any previous visit (from the past 240 hour(s)).       Radiology Studies: CT Angio Chest PE W and/or Wo Contrast  Result Date: 12/05/2021 CLINICAL DATA:  Dyspnea on exertion. EXAM: CT ANGIOGRAPHY CHEST WITH CONTRAST TECHNIQUE: Multidetector CT imaging of the chest was performed using the standard protocol during bolus administration of intravenous contrast. Multiplanar CT image reconstructions and MIPs were obtained to evaluate the vascular anatomy. RADIATION DOSE REDUCTION: This exam was performed according to the departmental dose-optimization program which includes automated exposure control, adjustment of the mA and/or kV according to patient size and/or use of iterative reconstruction technique. CONTRAST:  56mL OMNIPAQUE IOHEXOL 350 MG/ML SOLN COMPARISON:  January 11, 2021 FINDINGS: Cardiovascular: There is marked severity calcification of the aortic arch, without evidence of aortic aneurysm. Satisfactory opacification of the pulmonary arteries to the segmental level. No evidence of pulmonary embolism. There is mild cardiomegaly. A small pericardial effusion is noted. This represents a new finding when compared to the prior study. Mediastinum/Nodes: No enlarged mediastinal, hilar, or axillary lymph nodes. Thyroid gland, trachea, and esophagus demonstrate no significant findings. Lungs/Pleura: Diffuse interstitial thickening is noted with bilateral patchy, ill-defined ground-glass appearance of the lung parenchyma. Mild lateral right lower lobe infiltrate is seen. Mild posterior compressive atelectasis is also  seen within the bilateral lower lobes. Small to moderate sized bilateral pleural effusions are noted. No pneumothorax is identified. Upper Abdomen: No acute abnormality. Musculoskeletal: No chest wall abnormality. No acute or significant osseous findings. Review of the MIP images confirms the above findings. IMPRESSION: 1. No evidence of pulmonary embolism. 2. Findings consistent with moderate severity pulmonary edema. 3. Mild lateral right lower lobe infiltrate. 4. Small to moderate sized bilateral pleural effusions with mild posterior lower lobe compressive atelectasis. Aortic Atherosclerosis (ICD10-I70.0). Electronically Signed   By: Virgina Norfolk M.D.   On: 12/05/2021 02:08   DG Chest 2 View  Result Date: 12/04/2021 CLINICAL DATA:  Shortness of breath and lethargy. EXAM: CHEST - 2 VIEW COMPARISON:  PA Lat 11/09/2021 FINDINGS: There is mild cardiomegaly. There is interval increased central vascular engorgement and flow cephalization and increased  generalized interstitial edema. There are small pleural effusions. Hazy perihilar infiltrates are noted radiating outward consistent with alveolar edema, less likely pneumonia. Thoracic cage is intact. IMPRESSION: Cardiomegaly with moderate findings of CHF or fluid overload. Small pleural effusions. Perihilar opacities extending outward consistent with alveolar edema, less likely pneumonia. Progress chest films recommended depending on clinical response. Electronically Signed   By: Telford Nab M.D.   On: 12/04/2021 20:41        Scheduled Meds:  Chlorhexidine Gluconate Cloth  6 each Topical Q0600   Chlorhexidine Gluconate Cloth  6 each Topical Q0600   cinacalcet  60 mg Oral Q supper   [START ON 12/06/2021] doxercalciferol  10 mcg Intravenous Q T,Th,Sa-HD   ferric citrate  630 mg Oral TID WC   heparin  5,000 Units Subcutaneous Q8H   oxyCODONE       sodium chloride flush  3 mL Intravenous Q12H   Continuous Infusions:  sodium chloride       LOS:  0 days        Phillips Climes, MD [facility]   To contact the attending provider between 7A-7P or the covering provider during after hours 7P-7A, please log into the web site [email or web address] and access using universal [place] password for that web site. If you do not have the password, please call the hospital operator.  12/05/2021, 12:06 PM

## 2021-12-05 NOTE — ED Notes (Signed)
Patient transported to CT 

## 2021-12-05 NOTE — Telephone Encounter (Signed)
H&V Care Navigation CSW Progress Note  Clinical Social Worker  was contacted by Rip Harbour, LPN,  to f/u on pt concerns related to housing and finances. Noted pt is currently admitted at [facility] has reached out to my inpatient [facility] colleague [place], Kaycee, to see if she can touch base with pt while she is in the hospital for resources.   Patient is participating in a Managed Medicaid Plan:  No, UHC Dual Complete  SDOH Screenings   Tobacco Use: Medium Risk (12/05/2021)   Westley Hummer, MSW, [place]  [phone]- work cell phone (preferred) [phone]- desk phone

## 2021-12-05 NOTE — Progress Notes (Signed)
Received patient in bed to unit.  Alert and oriented.  Informed consent signed and in chart.   Treatment initiated: 0957 Treatment completed: 1416  Patient tolerated well.  Transported back to the room  Alert, without acute distress.  Hand-off given to patient's nurse.   Access used: LAVF Access issues: none  Total UF removed: 3500 Medication(s) given: Oxycodone10mg ,Xanax 1mg  Post HD VS: 98.3,92,31,144/72,3 L N/C 100 Post HD weight: 90.1 kg   [facility]

## 2021-12-05 NOTE — TOC Initial Note (Signed)
Transition of Care [facility]) - Initial/Assessment Note    Patient Details  Name: Desiree [name] MRN: [id number] Date of Birth: 10-31-1975  [facility]) CM/SW Contact:    Verdell Carmine, RN Phone Number: 12/05/2021, 4:11 PM  Clinical Narrative:                 Spoke to patient in room, explained role and discussed discharge planning. Patient lives at [facility]. She currently is staying here until September 29, where she will then move into an apartment. She drives herself to dialysis.  [facility] and received qualifications for oxygen. 2L  Patient has them with her. Repeat qualifications ordered.  Patient will likely need 2LPM concentrator,since she is moving in a couple of days.  No further needs identified, denies need for DME besides oxygen.  CM will follow for needs, recommendations, and transitions   Expected Discharge Plan: Home/Self Care Barriers to Discharge: Continued Medical Work up   Patient Goals and CMS Choice     Choice offered to / list presented to : Patient  Expected Discharge Plan and Services Expected Discharge Plan: Home/Self Care   Discharge Planning Services: CM Consult Post Acute Care Choice: Durable Medical Equipment Living arrangements for the past 2 months: Hotel/Motel (At [facility] until Sept[place] 29th then apartment)                                      Prior Living Arrangements/Services Living arrangements for the past 2 months: Hotel/Motel (At [facility] until Septmber 29th then apartment) Lives with:: Self Patient language and need for interpreter reviewed:: Yes        Need for Family Participation in Patient Care: Yes (Comment) Care giver support system in place?: Yes (comment)   Criminal Activity/Legal Involvement Pertinent to Current Situation/Hospitalization: No - Comment as needed  Activities of Daily Living      Permission Sought/Granted                  Emotional  Assessment Appearance:: Appears stated age Attitude/Demeanor/Rapport: Gracious Affect (typically observed): Accepting Orientation: : Oriented to Self, Oriented to Place, Oriented to Situation, Oriented to  Time Alcohol / Substance Use: Not Applicable Psych Involvement: No (comment)  Admission diagnosis:  Acute pulmonary edema (HCC) [J81.0] Acute respiratory failure with hypoxia (HCC) [J96.01] Patient Active Problem List   Diagnosis Date Noted   Acute respiratory failure with hypoxia ([place]) 12/05/2021   Hypertensive urgency 12/05/2021   Prolonged QT interval 12/05/2021   Anxiety    Acute gastroenteritis 02/16/2021   Sepsis ([place]) 01/11/2021   GERD (gastroesophageal reflux disease) 01/11/2021   Hyponatremia 01/11/2021   Pulmonary edema 01/11/2021   ESRD (end stage renal disease) on dialysis ([place]) 06/02/2018   Acute respiratory failure ([place]) 06/02/2018   Fever 12/07/2017   Abdominal pain 08/03/2016   Chronic pain 01/21/2016   Muscle cramps 03/29/2015   Anemia of chronic kidney failure 09/15/2013   Hyperkalemia 08/29/2013   Cocaine abuse ([place]) 08/26/2013   Depression 04/21/2012   ESRD (end stage renal disease) ([place]) 08/03/2011   PCP:  Harvie Junior, MD Pharmacy:   [facility] [place] [place] [address] Phone: [phone] Fax: [phone]     Social Determinants of Health (SDOH) Interventions  Readmission Risk Interventions     No data to display

## 2021-12-05 NOTE — Consult Note (Addendum)
Hospital Consult    Reason for Consult:  occasional buttonhole bleeding Requesting Physician:  Dr. Schertz MRN #:  [id number]  History of Present Illness: This is a 46 y.o. female being seen in consultation for evaluation of occasional bleeding from buttonholes of left arm fistula.  She had a brachiocephalic fistula creation with superficialization by Dr. Dickson in November 2020.  She states she has occasional bleeding overnight sometimes associated with coughing and sneezing from the buttonholes of her left arm AV fistula.  She denies any prolonged bleeding after HD treatments.  Per dialysis tech there has been no problems cannulating fistula and she has been able to run full treatments from her left arm fistula.  She is not on blood thinners.  She was admitted for respiratory distress and hypertensive episode.  Past Medical History:  Diagnosis Date   Anemia    Anxiety    panic attacks   Arthritis    bilateral knees   Childhood asthma    Complication of anesthesia    "sometimes it does not work; didn't during LEEP OR" (01/21/2016)   Depression    no med   ESRD (end stage renal disease) on dialysis (HCC)    "TTS; [facility]; [place]" (08/03/2016)   GERD (gastroesophageal reflux disease)    nexium prn   Gout    History of blood transfusion    "related to kidneys; I've had 4" (01/21/2016)   Hypertension    Migraine    last one 01/18/19   Preterm labor ~ 2014   Retina hole, left    Seizures (HCC)    "last one was in 2000; related to preeclampsia" (01/21/2016)    Past Surgical History:  Procedure Laterality Date   AV FISTULA PLACEMENT Left 2010   AV FISTULA PLACEMENT Left 01/20/2019   Procedure: ARTERIOVENOUS (AV) FISTULA CREATION LEFT UPPER ARM;  Surgeon: Dickson, Christopher S, MD;  Location: [facility] OR;  Service: Vascular;  Laterality: Left;   CERVICAL BIOPSY  W/ LOOP ELECTRODE EXCISION  2001   DILATION AND EVACUATION  08/02/2011   Procedure: DILATATION AND EVACUATION;   Surgeon: Kathy W Richardson, MD;  Location: [facility] ORS;  Service: Gynecology;;   DILATION AND EVACUATION N/A 08/31/2013   Procedure: DILATATION AND EVACUATION;  Surgeon: James G Arnold, MD;  Location: [facility] ORS;  Service: Gynecology;  Laterality: N/A;   FISTULA SUPERFICIALIZATION Left 01/20/2019   Procedure: Fistula Superficialization;  Surgeon: Dickson, Christopher S, MD;  Location: [facility] OR;  Service: Vascular;  Laterality: Left;   HYDRADENITIS EXCISION Right    RENAL BIOPSY     REVISION OF ARTERIOVENOUS GORETEX GRAFT Left 02/11/2013   Procedure: REVISION OF ARTERIOVENOUS GORTEX FISTULA;  Surgeon: Todd F Early, MD;  Location: [facility] OR;  Service: Vascular;  Laterality: Left;   THROMBECTOMY W/ EMBOLECTOMY Left 05/25/2018   Procedure: REPAIR OF BLEEDING ARTERIOVENOUS FISTULA;  Surgeon: Brabham, Vance W, MD;  Location: [facility] OR;  Service: Vascular;  Laterality: Left;    Allergies  Allergen Reactions   Morphine Shortness Of Breath and Anaphylaxis   Prednisone Other (See Comments)    Other reaction(s): Other (See Comments) Muscle spasms Patient says prednisone causes her to cramp all over, muscle spasms uncontrolled   Tuna [Fish Allergy] Itching, Swelling, Rash and Other (See Comments)    Face droops also   Amlodipine     Angioedema (09/05/17 ED visit)   Iodinated Contrast Media Itching   Tape Itching    Adhesive tape   paper tape ok    Prior   to Admission medications   Medication Sig Start Date End Date Taking? Authorizing Provider  albuterol (PROVENTIL HFA;VENTOLIN HFA) 108 (90 Base) MCG/ACT inhaler Inhale 2 puffs into the lungs every 4 (four) hours as needed for wheezing or shortness of breath. 05/02/16   Liu, Dana Duo, MD  ALPRAZolam (XANAX) 1 MG tablet Take 1 mg by mouth 3 (three) times daily as needed. 11/25/21   [provider]  AURYXIA 1 GM 210 MG(Fe) tablet Take 420 mg by mouth 3 (three) times daily. 01/02/21   [provider]  B Complex-C-Folic Acid (DIALYVITE 800) 0.8 MG WAFR  Take 1 tablet by mouth at bedtime. 03/23/19   [provider]  calcium acetate (PHOSLO) 667 MG capsule Take 667-1,334 mg by mouth See admin instructions. 3 capsules with meals  1 to 2 capsules with snacks 11/30/17   [provider]  cinacalcet (SENSIPAR) 60 MG tablet Take 60 mg by mouth every evening.    [provider]  cyclobenzaprine (FLEXERIL) 10 MG tablet Take 10 mg by mouth 2 (two) times daily as needed for muscle spasms.    [provider]  diphenhydrAMINE (BENADRYL) 25 MG tablet Take 1 tablet (25 mg total) by mouth every 6 (six) hours. Patient taking differently: Take 25 mg by mouth every 6 (six) hours as needed for itching or allergies. 09/05/17   Tran, Bowie, PA-C  NARCAN 4 MG/0.1ML LIQD nasal spray kit Place 1 spray into the nose as needed (accidental overdose). 11/14/18   [provider]  omeprazole (PRILOSEC) 20 MG capsule Take 20 mg by mouth daily as needed (acid reflux).  03/31/18   [provider]  ondansetron (ZOFRAN-ODT) 8 MG disintegrating tablet Take 8 mg by mouth every 8 (eight) hours as needed for nausea or vomiting.     [provider]  Oxycodone HCl 10 MG TABS Take 10 mg by mouth in the morning, at noon, and at bedtime. 05/17/19   [provider]  sevelamer carbonate (RENVELA) 800 MG tablet Take 1,600 mg by mouth 3 (three) times daily. 05/18/19   [provider]    Social History   Socioeconomic History   Marital status: Married    Spouse name: Not on file   Number of children: Not on file   Years of education: Not on file   Highest education level: Not on file  Occupational History   Not on file  Tobacco Use   Smoking status: Never    Passive exposure: Yes   Smokeless tobacco: Never   Tobacco comments:    Husband Smokes  Vaping Use   Vaping Use: Never used  Substance and Sexual Activity   Alcohol use: No   Drug use: Yes    Types: Marijuana    Comment: not recent   Sexual activity: Yes     Birth control/protection: None  Other Topics Concern   Not on file  Social History Narrative   Not on file   Social Determinants of Health   Financial Resource Strain: Not on file  Food Insecurity: Not on file  Transportation Needs: Not on file  Physical Activity: Not on file  Stress: Not on file  Social Connections: Not on file  Intimate Partner Violence: Not on file     Family History  Problem Relation Age of Onset   Diabetes Mother    Hyperlipidemia Mother    Hypertension Mother    Heart disease Mother    Hypertension Other    Diabetes type II Other       ROS: Otherwise negative unless mentioned in HPI  Physical Examination  Vitals:   12/05/21 1100 12/05/21 1130  BP: (!) 171/70 (!) 181/84  Pulse: 91 88  Resp: (!) 22 (!) 33  Temp:    SpO2: 100% 100%   Body mass index is 34.08 kg/m.  General:  WDWN in NAD Gait: Not observed HENT: WNL, normocephalic Pulmonary: normal non-labored breathing, without Rales, rhonchi,  wheezing Cardiac: regular Abdomen:  soft, NT/ND, no masses Skin: without rashes Vascular Exam/Pulses: palpable L radial pulse Extremities: currently dialyzing via L arm brachiocephalic fistula Musculoskeletal: no muscle wasting or atrophy  Neurologic: A&O X 3;  No focal weakness or paresthesias are detected; speech is fluent/normal Psychiatric:  The pt has Normal affect. Lymph:  Unremarkable  CBC    Component Value Date/Time   WBC 6.0 12/05/2021 0917   RBC 2.83 (L) 12/05/2021 0917   HGB 7.8 (L) 12/05/2021 0917   HCT 25.1 (L) 12/05/2021 0917   PLT 219 12/05/2021 0917   MCV 88.7 12/05/2021 0917   MCH 27.6 12/05/2021 0917   MCHC 31.1 12/05/2021 0917   RDW 17.3 (H) 12/05/2021 0917   LYMPHSABS 1.8 12/04/2021 2150   MONOABS 0.4 12/04/2021 2150   EOSABS 0.3 12/04/2021 2150   BASOSABS 0.0 12/04/2021 2150    BMET    Component Value Date/Time   NA 135 12/05/2021 0917   K 4.7 12/05/2021 0917   CL 94 (L) 12/05/2021 0917   CO2 24  12/05/2021 0917   GLUCOSE 151 (H) 12/05/2021 0917   BUN 45 (H) 12/05/2021 0917   CREATININE 14.52 (H) 12/05/2021 0917   CREATININE 19.35 (HH) 08/22/2013 0918   CALCIUM 9.4 12/05/2021 0917   GFRNONAA 3 (L) 12/05/2021 0917   GFRAA 3 (L) 06/12/2019 0139    COAGS: Lab Results  Component Value Date   INR 1.2 01/10/2021   INR 1.21 04/12/2018   INR 1.09 03/29/2015      ASSESSMENT/PLAN: This is a 46 y.o. female with occasional bleeding from buttonholes of L arm fistula  - L arm AVF working well on HD and no trouble with cannulation reported - She denies any significant aneurysmal degeneration and prolonged stick site bleeding - Exam was limited on HD but patient may require plication of L arm fistula to remove buttonhole site to prevent further bleeding events -This can be performed early next week if still inpatient or can be performed on an outpatient basis -On call vascular surgeon Dr. Kyiah Canepa will evaluate the patient later today and provide further treatment plans   Matthew Eveland PA-C [facility] [phone]  VASCULAR STAFF ADDENDUM: I have independently interviewed and examined the patient. I agree with the above.  Prolonged bleeding after decannulation. Plan outpatient fistulagram early next week. Will arrange through the office. Please call for questions.  Levell Tavano N. Morissa Obeirne, MD [facility] [place] Office Phone Number: [phone] 12/05/2021 3:27 PM   

## 2021-12-05 NOTE — H&P (Signed)
History and Physical    Desiree [name] KMQ:[id number] DOB: 1975/08/01 DOA: 12/04/2021  PCP: Harvie Junior, MD   Patient coming from: home   Chief Complaint: SOB   HPI: Desiree [name] is a 46 y.o. female with medical history significant for ESRD on hemodialysis, anxiety, and hypertension, now presenting to the emergency department for evaluation of hypoxia.  Patient reports that she has been experiencing shortness of breath for the past 6 weeks despite her dry weight being lowered multiple times.  She denies any associated fever, chills, or sputum production.  She was evaluated by cardiology in the clinic yesterday for the symptoms, was sent for CTA chest, was being scheduled for echocardiogram, and home oxygen was being arranged.  When she presented for her CT angio, it was noted that she has contrast allergy and was hypoxic.  She was sent to the ED due to this.  [facility] ED Course: Upon arrival to the ED, patient is found to be afebrile and saturating well on 2 to 4 L/min of supplemental oxygen with normal heart rate and elevated blood pressure.  CTA chest is negative for PE but notable for pulmonary edema with small to moderate bilateral pleural effusions.  Patient was treated with Xanax and oxycodone in the ED and transferred to [facility] for admission.  Review of Systems:  All other systems reviewed and apart from HPI, are negative.  Past Medical History:  Diagnosis Date   Anemia    Anxiety    panic attacks   Arthritis    bilateral knees   Childhood asthma    Complication of anesthesia    "sometimes it does not work; didn't during LEEP OR" (01/21/2016)   Depression    no med   ESRD (end stage renal disease) on dialysis ([place])    "TTS; [facility]; [facility]" (08/03/2016)   GERD (gastroesophageal reflux disease)    nexium prn   Gout    History of blood transfusion    "related to kidneys; I've had 4" (01/21/2016)   Hypertension    Migraine     last one 01/18/19   Preterm labor ~ 2014   Renal insufficiency    Retina hole, left    Seizures ([place])    "last one was in 2000; related to preeclampsia" (01/21/2016)    Past Surgical History:  Procedure Laterality Date   AV FISTULA PLACEMENT Left 2010   AV FISTULA PLACEMENT Left 01/20/2019   Procedure: ARTERIOVENOUS (AV) FISTULA CREATION LEFT UPPER ARM;  Surgeon: Angelia Mould, MD;  Location: [place];  Service: Vascular;  Laterality: Left;   CERVICAL BIOPSY  W/ LOOP ELECTRODE EXCISION  2001   DILATION AND EVACUATION  08/02/2011   Procedure: DILATATION AND EVACUATION;  Surgeon: Logan Bores, MD;  Location: [place] [facility];  Service: Gynecology;;   DILATION AND EVACUATION N/A 08/31/2013   Procedure: DILATATION AND EVACUATION;  Surgeon: Woodroe Mode, MD;  Location: [place] [facility];  Service: Gynecology;  Laterality: N/A;   FISTULA SUPERFICIALIZATION Left 01/20/2019   Procedure: Fistula Superficialization;  Surgeon: Angelia Mould, MD;  Location: [place];  Service: Vascular;  Laterality: Left;   HYDRADENITIS EXCISION Right    RENAL BIOPSY     REVISION OF ARTERIOVENOUS GORETEX GRAFT Left 02/11/2013   Procedure: REVISION OF ARTERIOVENOUS GORTEX FISTULA;  Surgeon: Rosetta Posner, MD;  Location: [place];  Service: Vascular;  Laterality: Left;   THROMBECTOMY W/ EMBOLECTOMY Left 05/25/2018   Procedure: REPAIR OF BLEEDING ARTERIOVENOUS FISTULA;  Surgeon: Serafina Mitchell, MD;  [facility];  Service: Vascular;  Laterality: Left;    Social History:   reports that she has never smoked. She has been exposed to tobacco smoke. She has never used smokeless tobacco. She reports current drug use. Drug: Marijuana. She reports that she does not drink alcohol.  Allergies  Allergen Reactions   Morphine Shortness Of Breath and Anaphylaxis   Prednisone Other (See Comments)    Other reaction(s): Other (See Comments) Muscle spasms Patient says prednisone causes her to cramp all over, muscle spasms uncontrolled    Tuna [Fish Allergy] Itching, Swelling, Rash and Other (See Comments)    Face droops also   Amlodipine     Angioedema (09/05/17 ED visit)   Iodinated Contrast Media Itching   Tape Itching    Adhesive tape   paper tape ok    Family History  Problem Relation Age of Onset   Diabetes Mother    Hyperlipidemia Mother    Hypertension Mother    Heart disease Mother    Hypertension Other    Diabetes type II Other      Prior to Admission medications   Medication Sig Start Date End Date Taking? Authorizing Provider  albuterol (PROVENTIL HFA;VENTOLIN HFA) 108 (90 Base) MCG/ACT inhaler Inhale 2 puffs into the lungs every 4 (four) hours as needed for wheezing or shortness of breath. 05/02/16   Forde Dandy, MD  ALPRAZolam Duanne Moron) 1 MG tablet Take 1 mg by mouth 3 (three) times daily as needed. 11/25/21   [provider]  AURYXIA 1 GM 210 MG(Fe) tablet Take 420 mg by mouth 3 (three) times daily. 01/02/21   [provider]  B Complex-C-Folic Acid (DIALYVITE 427) 0.8 MG WAFR Take 1 tablet by mouth at bedtime. 03/23/19   [provider]  calcium acetate (PHOSLO) 667 MG capsule Take 667-1,334 mg by mouth See admin instructions. 3 capsules with meals  1 to 2 capsules with snacks 11/30/17   [provider]  cinacalcet (SENSIPAR) 60 MG tablet Take 60 mg by mouth every evening.    [provider]  cyclobenzaprine (FLEXERIL) 10 MG tablet Take 10 mg by mouth 2 (two) times daily as needed for muscle spasms.    [provider]  diphenhydrAMINE (BENADRYL) 25 MG tablet Take 1 tablet (25 mg total) by mouth every 6 (six) hours. Patient taking differently: Take 25 mg by mouth every 6 (six) hours as needed for itching or allergies. 09/05/17   Domenic Moras, PA-C  NARCAN 4 MG/0.1ML LIQD nasal spray kit Place 1 spray into the nose as needed (accidental overdose). 11/14/18   [provider]  omeprazole (PRILOSEC) 20 MG capsule Take 20 mg by mouth daily as needed  (acid reflux).  03/31/18   [provider]  ondansetron (ZOFRAN-ODT) 8 MG disintegrating tablet Take 8 mg by mouth every 8 (eight) hours as needed for nausea or vomiting.     [provider]  Oxycodone HCl 10 MG TABS Take 10 mg by mouth in the morning, at noon, and at bedtime. 05/17/19   [provider]  sevelamer carbonate (RENVELA) 800 MG tablet Take 1,600 mg by mouth 3 (three) times daily. 05/18/19   [provider]    Physical Exam: Vitals:   12/05/21 0220 12/05/21 0240 12/05/21 0340 12/05/21 0430  BP: (!) 170/111 (!) 182/105 (!) 186/102 (!) 158/89  Pulse: 84 88 90 88  Resp: (!) 36 (!) 27 (!) 25 (!) 26  Temp:  98.5 F (36.9 C)   TempSrc:    Oral  SpO2: 98% 92% 93%   Weight:    92.7 kg  Height:        Constitutional: NAD, no pallor or diaphoresis  Eyes: PERTLA, lids and conjunctivae normal ENMT: Mucous membranes are moist. Posterior pharynx clear of any exudate or lesions.   Neck: supple, no masses  Respiratory: fine rales, no wheezing. No accessory muscle use.  Cardiovascular: S1 & S2 heard, regular rate and rhythm. B/l ankle edema. Abdomen: No distension, no tenderness, soft. Bowel sounds active.  Musculoskeletal: no clubbing / cyanosis. No joint deformity upper and lower extremities.   Skin: no significant rashes, lesions, ulcers. Warm, dry, well-perfused. Neurologic: CN 2-12 grossly intact. Moving all extremities. Alert and oriented.  Psychiatric: Calm. Cooperative.    Labs and Imaging on Admission: I have personally reviewed following labs and imaging studies  CBC: Recent Labs  Lab 12/04/21 2112 12/04/21 2150  WBC  --  6.8  NEUTROABS  --  4.3  HGB 8.5* 7.9*  HCT 25.0* 26.5*  MCV  --  89.8  PLT  --  426   Basic Metabolic Panel: Recent Labs  Lab 12/04/21 2112 12/04/21 2150  NA 138 139  K 3.9 4.1  CL  --  95*  CO2  --  29  GLUCOSE  --  79  BUN  --  40*  CREATININE  --  13.43*  CALCIUM  --  10.2   GFR: Estimated  Creatinine Clearance: 6 mL/min (A) (by C-G formula based on SCr of 13.43 mg/dL (H)). Liver Function Tests: Recent Labs  Lab 12/04/21 2150  AST 11*  ALT 7  ALKPHOS 36*  BILITOT 0.5  PROT 7.2  ALBUMIN 3.6   Recent Labs  Lab 12/04/21 2150  LIPASE 84*   No results for input(s): "AMMONIA" in the last 168 hours. Coagulation Profile: No results for input(s): "INR", "PROTIME" in the last 168 hours. Cardiac Enzymes: No results for input(s): "CKTOTAL", "CKMB", "CKMBINDEX", "TROPONINI" in the last 168 hours. BNP (last 3 results) No results for input(s): "PROBNP" in the last 8760 hours. HbA1C: No results for input(s): "HGBA1C" in the last 72 hours. CBG: No results for input(s): "GLUCAP" in the last 168 hours. Lipid Profile: No results for input(s): "CHOL", "HDL", "LDLCALC", "TRIG", "CHOLHDL", "LDLDIRECT" in the last 72 hours. Thyroid Function Tests: No results for input(s): "TSH", "T4TOTAL", "FREET4", "T3FREE", "THYROIDAB" in the last 72 hours. Anemia Panel: No results for input(s): "VITAMINB12", "FOLATE", "FERRITIN", "TIBC", "IRON", "RETICCTPCT" in the last 72 hours. Urine analysis:    Component Value Date/Time   COLORURINE YELLOW 12/20/2013 0530   APPEARANCEUR CLOUDY (A) 12/20/2013 0530   LABSPEC 1.009 12/20/2013 0530   PHURINE 8.0 12/20/2013 0530   GLUCOSEU 100 (A) 12/20/2013 0530   HGBUR LARGE (A) 12/20/2013 0530   BILIRUBINUR NEGATIVE 12/20/2013 0530   KETONESUR NEGATIVE 12/20/2013 0530   PROTEINUR 100 (A) 12/20/2013 0530   UROBILINOGEN 0.2 12/20/2013 0530   NITRITE NEGATIVE 12/20/2013 0530   LEUKOCYTESUR TRACE (A) 12/20/2013 0530   Sepsis Labs: $[email or web address]'[id number]$ (procalcitonin:4,lacticidven:4) )No results found for this or any previous visit (from the past 240 hour(s)).   Radiological Exams on Admission: CT Angio Chest PE W and/or Wo Contrast  Result Date: 12/05/2021 CLINICAL DATA:  Dyspnea on exertion. EXAM: CT ANGIOGRAPHY CHEST WITH CONTRAST TECHNIQUE: Multidetector CT  imaging of the chest was performed using the standard protocol during bolus administration of intravenous contrast. Multiplanar CT image reconstructions and MIPs were obtained to  evaluate the vascular anatomy. RADIATION DOSE REDUCTION: This exam was performed according to the departmental dose-optimization program which includes automated exposure control, adjustment of the mA and/or kV according to patient size and/or use of iterative reconstruction technique. CONTRAST:  8mL OMNIPAQUE IOHEXOL 350 MG/ML SOLN COMPARISON:  January 11, 2021 FINDINGS: Cardiovascular: There is marked severity calcification of the aortic arch, without evidence of aortic aneurysm. Satisfactory opacification of the pulmonary arteries to the segmental level. No evidence of pulmonary embolism. There is mild cardiomegaly. A small pericardial effusion is noted. This represents a new finding when compared to the prior study. Mediastinum/Nodes: No enlarged mediastinal, hilar, or axillary lymph nodes. Thyroid gland, trachea, and esophagus demonstrate no significant findings. Lungs/Pleura: Diffuse interstitial thickening is noted with bilateral patchy, ill-defined ground-glass appearance of the lung parenchyma. Mild lateral right lower lobe infiltrate is seen. Mild posterior compressive atelectasis is also seen within the bilateral lower lobes. Small to moderate sized bilateral pleural effusions are noted. No pneumothorax is identified. Upper Abdomen: No acute abnormality. Musculoskeletal: No chest wall abnormality. No acute or significant osseous findings. Review of the MIP images confirms the above findings. IMPRESSION: 1. No evidence of pulmonary embolism. 2. Findings consistent with moderate severity pulmonary edema. 3. Mild lateral right lower lobe infiltrate. 4. Small to moderate sized bilateral pleural effusions with mild posterior lower lobe compressive atelectasis. Aortic Atherosclerosis (ICD10-I70.0). Electronically Signed   By: Virgina Norfolk M.D.   On: 12/05/2021 02:08   DG Chest 2 View  Result Date: 12/04/2021 CLINICAL DATA:  Shortness of breath and lethargy. EXAM: CHEST - 2 VIEW COMPARISON:  PA Lat 11/09/2021 FINDINGS: There is mild cardiomegaly. There is interval increased central vascular engorgement and flow cephalization and increased generalized interstitial edema. There are small pleural effusions. Hazy perihilar infiltrates are noted radiating outward consistent with alveolar edema, less likely pneumonia. Thoracic cage is intact. IMPRESSION: Cardiomegaly with moderate findings of CHF or fluid overload. Small pleural effusions. Perihilar opacities extending outward consistent with alveolar edema, less likely pneumonia. Progress chest films recommended depending on clinical response. Electronically Signed   By: Telford Nab M.D.   On: 12/04/2021 20:41    EKG: Independently reviewed. Sinus rhythm.   Assessment/Plan   1. Acute hypoxic respiratory failure; pulmonary edema  - Presented for outpatient CTA chest to evaluate her SOB but was found to have O2 saturations in 70s and was directed to the ED  - She has pulmonary edema on imaging, PE ruled-out by CTA chest, no infectious s/s  - Continue supplemental O2, restrict fluids, consult nephrology in am for dialysis    2. ESRD  - Last dialyzed 12/02/21  - She is hypervolemic on admission with hypoxia but speaking full sentences  - Consult nephrology in am, restrict fluids, renally-dose medications    3. Hypertensive urgency  - Anticipate improvement with HD, use as-needed labetalol for now   4. Chronic pain  - Prescription database reviewed, continue home regimen    5. Anxiety  - Continue as-needed Xanax    6. Anemia  - No overt bleeding, appears close to baseline    DVT prophylaxis: sq heparin  Code Status: Full  Level of Care: Level of care: Telemetry Medical Family Communication: none present  Disposition Plan:  Patient is from: home  Anticipated d/c  is to: Home  Anticipated d/c date is: 12/06/21  Patient currently: pending improvement in oxygenation or establishment of home O2 Consults called: none  Admission status: Observation     Domanick Cuccia S Abrar Bilton,  MD [facility]  12/05/2021, 5:18 AM

## 2021-12-05 NOTE — ED Notes (Signed)
RT placed pt on [place] 4 Lpm for increased WOB and tachypnea per MD. RT will continue to monitor while at [facility] ED.

## 2021-12-05 NOTE — Procedures (Signed)
   I was present at this dialysis session, have reviewed the session itself and made  appropriate changes Kelly Splinter MD [place] pager [phone]   12/05/2021, 11:39 AM

## 2021-12-05 NOTE — Plan of Care (Signed)

## 2021-12-05 NOTE — Progress Notes (Signed)
Pt. States, pain in arms, legs. Back and shouder

## 2021-12-05 NOTE — ED Notes (Signed)
[facility] arrived to transport pt. Pt stable at time of departure ?

## 2021-12-05 NOTE — H&P (View-Only) (Signed)
Hospital Consult    Reason for Consult:  occasional buttonhole bleeding Requesting Physician:  Dr. Jonnie Finner MRN #:  [id number]  History of Present Illness: This is a 46 y.o. female being seen in consultation for evaluation of occasional bleeding from buttonholes of left arm fistula.  She had a brachiocephalic fistula creation with superficialization by Dr. Scot Dock in November 2020.  She states she has occasional bleeding overnight sometimes associated with coughing and sneezing from the buttonholes of her left arm AV fistula.  She denies any prolonged bleeding after HD treatments.  Per dialysis tech there has been no problems cannulating fistula and she has been able to run full treatments from her left arm fistula.  She is not on blood thinners.  She was admitted for respiratory distress and hypertensive episode.  Past Medical History:  Diagnosis Date   Anemia    Anxiety    panic attacks   Arthritis    bilateral knees   Childhood asthma    Complication of anesthesia    "sometimes it does not work; didn't during LEEP OR" (01/21/2016)   Depression    no med   ESRD (end stage renal disease) on dialysis ([place])    "TTS; [facility]" (08/03/2016)   GERD (gastroesophageal reflux disease)    nexium prn   Gout    History of blood transfusion    "related to kidneys; I've had 4" (01/21/2016)   Hypertension    Migraine    last one 01/18/19   Preterm labor ~ 2014   Retina hole, left    Seizures ([place])    "last one was in 2000; related to preeclampsia" (01/21/2016)    Past Surgical History:  Procedure Laterality Date   AV FISTULA PLACEMENT Left 2010   AV FISTULA PLACEMENT Left 01/20/2019   Procedure: ARTERIOVENOUS (AV) FISTULA CREATION LEFT UPPER ARM;  Surgeon: Angelia Mould, MD;  Location: [place];  Service: Vascular;  Laterality: Left;   CERVICAL BIOPSY  W/ LOOP ELECTRODE EXCISION  2001   DILATION AND EVACUATION  08/02/2011   Procedure: DILATATION AND EVACUATION;   Surgeon: Logan Bores, MD;  Location: [place] [facility];  Service: Gynecology;;   DILATION AND EVACUATION N/A 08/31/2013   Procedure: DILATATION AND EVACUATION;  Surgeon: Woodroe Mode, MD;  Location: [place] [facility];  Service: Gynecology;  Laterality: N/A;   FISTULA SUPERFICIALIZATION Left 01/20/2019   Procedure: Fistula Superficialization;  Surgeon: Angelia Mould, MD;  Location: [place];  Service: Vascular;  Laterality: Left;   HYDRADENITIS EXCISION Right    RENAL BIOPSY     REVISION OF ARTERIOVENOUS GORETEX GRAFT Left 02/11/2013   Procedure: REVISION OF ARTERIOVENOUS GORTEX FISTULA;  Surgeon: Rosetta Posner, MD;  Location: [facility];  Service: Vascular;  Laterality: Left;   THROMBECTOMY W/ EMBOLECTOMY Left 05/25/2018   Procedure: REPAIR OF BLEEDING ARTERIOVENOUS FISTULA;  Surgeon: Serafina Mitchell, MD;  Location: [place];  Service: Vascular;  Laterality: Left;    Allergies  Allergen Reactions   Morphine Shortness Of Breath and Anaphylaxis   Prednisone Other (See Comments)    Other reaction(s): Other (See Comments) Muscle spasms Patient says prednisone causes her to cramp all over, muscle spasms uncontrolled   Tuna [Fish Allergy] Itching, Swelling, Rash and Other (See Comments)    Face droops also   Amlodipine     Angioedema (09/05/17 ED visit)   Iodinated Contrast Media Itching   Tape Itching    Adhesive tape   paper tape ok    Prior  to Admission medications   Medication Sig Start Date End Date Taking? Authorizing Provider  albuterol (PROVENTIL HFA;VENTOLIN HFA) 108 (90 Base) MCG/ACT inhaler Inhale 2 puffs into the lungs every 4 (four) hours as needed for wheezing or shortness of breath. 05/02/16   Forde Dandy, MD  ALPRAZolam Duanne Moron) 1 MG tablet Take 1 mg by mouth 3 (three) times daily as needed. 11/25/21   [provider]  AURYXIA 1 GM 210 MG(Fe) tablet Take 420 mg by mouth 3 (three) times daily. 01/02/21   [provider]  B Complex-C-Folic Acid (DIALYVITE 694) 0.8 MG WAFR  Take 1 tablet by mouth at bedtime. 03/23/19   [provider]  calcium acetate (PHOSLO) 667 MG capsule Take 667-1,334 mg by mouth See admin instructions. 3 capsules with meals  1 to 2 capsules with snacks 11/30/17   [provider]  cinacalcet (SENSIPAR) 60 MG tablet Take 60 mg by mouth every evening.    [provider]  cyclobenzaprine (FLEXERIL) 10 MG tablet Take 10 mg by mouth 2 (two) times daily as needed for muscle spasms.    [provider]  diphenhydrAMINE (BENADRYL) 25 MG tablet Take 1 tablet (25 mg total) by mouth every 6 (six) hours. Patient taking differently: Take 25 mg by mouth every 6 (six) hours as needed for itching or allergies. 09/05/17   Domenic Moras, PA-C  NARCAN 4 MG/0.1ML LIQD nasal spray kit Place 1 spray into the nose as needed (accidental overdose). 11/14/18   [provider]  omeprazole (PRILOSEC) 20 MG capsule Take 20 mg by mouth daily as needed (acid reflux).  03/31/18   [provider]  ondansetron (ZOFRAN-ODT) 8 MG disintegrating tablet Take 8 mg by mouth every 8 (eight) hours as needed for nausea or vomiting.     [provider]  Oxycodone HCl 10 MG TABS Take 10 mg by mouth in the morning, at noon, and at bedtime. 05/17/19   [provider]  sevelamer carbonate (RENVELA) 800 MG tablet Take 1,600 mg by mouth 3 (three) times daily. 05/18/19   [provider]    Social History   Socioeconomic History   Marital status: Married    Spouse name: Not on file   Number of children: Not on file   Years of education: Not on file   Highest education level: Not on file  Occupational History   Not on file  Tobacco Use   Smoking status: Never    Passive exposure: Yes   Smokeless tobacco: Never   Tobacco comments:    Husband Smokes  Vaping Use   Vaping Use: Never used  Substance and Sexual Activity   Alcohol use: No   Drug use: Yes    Types: Marijuana    Comment: not recent   Sexual activity: Yes     Birth control/protection: None  Other Topics Concern   Not on file  Social History Narrative   Not on file   Social Determinants of Health   Financial Resource Strain: Not on file  Food Insecurity: Not on file  Transportation Needs: Not on file  Physical Activity: Not on file  Stress: Not on file  Social Connections: Not on file  Intimate Partner Violence: Not on file     Family History  Problem Relation Age of Onset   Diabetes Mother    Hyperlipidemia Mother    Hypertension Mother    Heart disease Mother    Hypertension Other    Diabetes type II Other  ROS: Otherwise negative unless mentioned in HPI  Physical Examination  Vitals:   12/05/21 1100 12/05/21 1130  BP: (!) 171/70 (!) 181/84  Pulse: 91 88  Resp: (!) 22 (!) 33  Temp:    SpO2: 100% 100%   Body mass index is 34.08 kg/m.  General:  WDWN in NAD Gait: Not observed HENT: WNL, normocephalic Pulmonary: normal non-labored breathing, without Rales, rhonchi,  wheezing Cardiac: regular Abdomen:  soft, NT/ND, no masses Skin: without rashes Vascular Exam/Pulses: palpable L radial pulse Extremities: currently dialyzing via L arm brachiocephalic fistula Musculoskeletal: no muscle wasting or atrophy  Neurologic: A&O X 3;  No focal weakness or paresthesias are detected; speech is fluent/normal Psychiatric:  The pt has Normal affect. Lymph:  Unremarkable  CBC    Component Value Date/Time   WBC 6.0 12/05/2021 0917   RBC 2.83 (L) 12/05/2021 0917   HGB 7.8 (L) 12/05/2021 0917   HCT 25.1 (L) 12/05/2021 0917   PLT 219 12/05/2021 0917   MCV 88.7 12/05/2021 0917   MCH 27.6 12/05/2021 0917   MCHC 31.1 12/05/2021 0917   RDW 17.3 (H) 12/05/2021 0917   LYMPHSABS 1.8 12/04/2021 2150   MONOABS 0.4 12/04/2021 2150   EOSABS 0.3 12/04/2021 2150   BASOSABS 0.0 12/04/2021 2150    BMET    Component Value Date/Time   NA 135 12/05/2021 0917   K 4.7 12/05/2021 0917   CL 94 (L) 12/05/2021 0917   CO2 24  12/05/2021 0917   GLUCOSE 151 (H) 12/05/2021 0917   BUN 45 (H) 12/05/2021 0917   CREATININE 14.52 (H) 12/05/2021 0917   CREATININE 19.35 (HH) 08/22/2013 0918   CALCIUM 9.4 12/05/2021 0917   GFRNONAA 3 (L) 12/05/2021 0917   GFRAA 3 (L) 06/12/2019 0139    COAGS: Lab Results  Component Value Date   INR 1.2 01/10/2021   INR 1.21 04/12/2018   INR 1.09 03/29/2015      ASSESSMENT/PLAN: This is a 46 y.o. female with occasional bleeding from buttonholes of L arm fistula  - L arm AVF working well on HD and no trouble with cannulation reported - She denies any significant aneurysmal degeneration and prolonged stick site bleeding - Exam was limited on HD but patient may require plication of L arm fistula to remove buttonhole site to prevent further bleeding events -This can be performed early next week if still inpatient or can be performed on an outpatient basis -On call vascular surgeon Dr. Stanford Breed will evaluate the patient later today and provide further treatment plans   Dagoberto Ligas PA-C [facility] [phone]  VASCULAR STAFF ADDENDUM: I have independently interviewed and examined the patient. I agree with the above.  Prolonged bleeding after decannulation. Plan outpatient fistulagram early next week. Will arrange through the office. Please call for questions.  Yevonne Aline. Stanford Breed, MD [facility] Phone Number: [phone] 12/05/2021 3:27 PM

## 2021-12-05 NOTE — Telephone Encounter (Signed)
Left message for patient to call and schedule the Echocardiogram ordered by Dr. Harrell Gave

## 2021-12-05 NOTE — ED Notes (Signed)
Report given to Jolaine Click RN

## 2021-12-06 ENCOUNTER — Telehealth: Payer: Self-pay | Admitting: Home Health

## 2021-12-06 DIAGNOSIS — J810 Acute pulmonary edema: Secondary | ICD-10-CM | POA: Diagnosis not present

## 2021-12-06 DIAGNOSIS — N186 End stage renal disease: Secondary | ICD-10-CM | POA: Diagnosis not present

## 2021-12-06 DIAGNOSIS — J9601 Acute respiratory failure with hypoxia: Secondary | ICD-10-CM | POA: Diagnosis not present

## 2021-12-06 DIAGNOSIS — N185 Chronic kidney disease, stage 5: Secondary | ICD-10-CM | POA: Diagnosis not present

## 2021-12-06 LAB — BASIC METABOLIC PANEL
Anion gap: 11 (ref 5–15)
BUN: 37 mg/dL — ABNORMAL HIGH (ref 6–20)
CO2: 28 mmol/L (ref 22–32)
Calcium: 8.8 mg/dL — ABNORMAL LOW (ref 8.9–10.3)
Chloride: 100 mmol/L (ref 98–111)
Creatinine, Ser: 9.77 mg/dL — ABNORMAL HIGH (ref 0.44–1.00)
GFR, Estimated: 5 mL/min — ABNORMAL LOW (ref >60)
Glucose, Bld: 105 mg/dL — ABNORMAL HIGH (ref 70–99)
Potassium: 3.7 mmol/L (ref 3.5–5.1)
Sodium: 139 mmol/L (ref 135–145)

## 2021-12-06 LAB — CBC
HCT: 26 % — ABNORMAL LOW (ref 36.0–46.0)
Hemoglobin: 7.8 g/dL — ABNORMAL LOW (ref 12.0–15.0)
MCH: 27.1 pg (ref 26.0–34.0)
MCHC: 30 g/dL (ref 30.0–36.0)
MCV: 90.3 fL (ref 80.0–100.0)
Platelets: 246 10*3/uL (ref 150–400)
RBC: 2.88 MIL/uL — ABNORMAL LOW (ref 3.87–5.11)
RDW: 17.3 % — ABNORMAL HIGH (ref 11.5–15.5)
WBC: 6.7 10*3/uL (ref 4.0–10.5)
nRBC: 0 % (ref 0.0–0.2)

## 2021-12-06 LAB — TYPE AND SCREEN
ABO/RH(D): B NEG
Antibody Screen: NEGATIVE

## 2021-12-06 NOTE — Discharge Summary (Addendum)
Physician Discharge Summary  Desiree [name] ZOX:[id number] DOB: 1975/03/22 DOA: 12/04/2021  PCP: Harvie Junior, MD  Admit date: 12/04/2021 Discharge date: 12/06/2021  Admitted From: Home Disposition:  Home   Recommendations for Outpatient Follow-up:  Follow up with PCP in 1-2 weeks Please obtain BMP/CBC in one week    Discharge Condition:Stable CODE STATUS: FULL Diet recommendation: Heart Healthy / Renal  Brief/Interim Summary:   Desiree [name] is a 46 y.o. female with medical history significant for ESRD on hemodialysis, anxiety, and hypertension, now presenting to the emergency department for evaluation of hypoxia. Patient reports that she has been experiencing shortness of breath for the past 6 weeks despite her dry weight being lowered multiple times.  She denies any associated fever, chills, or sputum production.  She was evaluated by cardiology in the clinic yesterday for the symptoms, was sent for CTA chest, was being scheduled for echocardiogram, and home oxygen was being arranged. -When she presented for her CT angio, it was noted that she has contrast allergy and was hypoxic.  She was sent to the ED due to this.  [facility] ED Course: Upon arrival to the ED, patient is found to be afebrile and saturating well on 2 to 4 L/min of supplemental oxygen with normal heart rate and elevated blood pressure.  CTA chest is negative for PE but notable for pulmonary edema with small to moderate bilateral pleural effusions.  Patient was treated with Xanax and oxycodone in the ED and transferred to [facility] for admission.    Acute hypoxic respiratory failure; pulmonary edema  - Presented for outpatient CTA chest to evaluate her SOB but was found to have O2 saturations in 70s and was directed to the ED  - She has pulmonary edema on imaging, PE ruled-out by CTA chest, no infectious s/s  -Patient was admitted for further management, she was seen by nephrology, where she  was dialyzed yesterday, plan was to dialyze again today to adjust her dry weight further, but patient was adamant this a.m. about leaving, patient reports she already called her outpatient HD center in [place] and was told she can get her dialysis session today as long she gets there before 11 AM, and she is requesting expedited discharge which I have arranged for. -I have checked her oxygen saturation before discharge where she was 100% on room air. -I have discussed with her and her husband at bedside, I have offered to stay 1 more day to arrange for discharge after HD today, but she was adamant about leaving and receiving her HD as an outpatient  Addendum 1 PM: -After discharge patient is requesting home oxygen, I have called patient and informed her we have no qualifying criteria for home oxygen given her oxygen saturation was 100% on room air upon discharge, patient did not want to wait for any ambulatory saturation before discharge, actually patient rushed  out from her room even before given discharge papers or instructions.  ESRD  -Please see above discussion   Hypertensive urgency  -Has improved with HD, most recent blood pressure 138/72   Chronic pain  - Prescription database reviewed, continue home regimen     Anxiety   Anemia of chronic kidney disease        Discharge Diagnoses:  Principal Problem:   Acute respiratory failure with hypoxia ([place]) Active Problems:   Anemia of chronic kidney failure   Chronic pain   ESRD (end stage renal disease) on dialysis [facility])   Pulmonary  edema   Hypertensive urgency   Anxiety   Prolonged QT interval    Discharge Instructions  Discharge Instructions     Diet - low sodium heart healthy   Complete by: As directed    Increase activity slowly   Complete by: As directed       Allergies as of 12/06/2021       Reactions   Morphine Shortness Of Breath, Anaphylaxis   Prednisone Other (See Comments)   Other reaction(s): Other  (See Comments) Muscle spasms Patient says prednisone causes her to cramp all over, muscle spasms uncontrolled   Tuna [fish Allergy] Itching, Swelling, Rash, Other (See Comments)   Face droops also   Amlodipine Other (See Comments)   Angioedema (09/05/17 ED visit)   Iodinated Contrast Media Itching   Tape Itching   Adhesive tape   paper tape ok        Medication List     TAKE these medications    albuterol 108 (90 Base) MCG/ACT inhaler Commonly known as: VENTOLIN HFA Inhale 2 puffs into the lungs every 4 (four) hours as needed for wheezing or shortness of breath.   ALPRAZolam 1 MG tablet Commonly known as: XANAX Take 1 mg by mouth 3 (three) times daily as needed.   aspirin EC 81 MG tablet Take 81 mg by mouth daily. Swallow whole.   Auryxia 1 GM 210 MG(Fe) tablet Generic drug: ferric citrate Take 420 mg by mouth 3 (three) times daily.   calcium acetate 667 MG capsule Commonly known as: PHOSLO Take 667-1,334 mg by mouth See admin instructions. 3 capsules with meals  1 to 2 capsules with snacks   cinacalcet 60 MG tablet Commonly known as: SENSIPAR Take 60 mg by mouth every evening.   cyclobenzaprine 10 MG tablet Commonly known as: FLEXERIL Take 10 mg by mouth 2 (two) times daily as needed for muscle spasms.   Dialyvite 800 0.8 MG Wafr Take 1 tablet by mouth at bedtime.   diphenhydrAMINE 25 MG tablet Commonly known as: BENADRYL Take 1 tablet (25 mg total) by mouth every 6 (six) hours. What changed:  when to take this reasons to take this   doxycycline 100 MG tablet Commonly known as: VIBRA-TABS Take 100 mg by mouth 2 (two) times daily.   lactulose 10 GM/15ML solution Commonly known as: CHRONULAC Take 30 mLs by mouth daily as needed for mild constipation, moderate constipation or severe constipation.   Narcan 4 MG/0.1ML Liqd nasal spray kit Generic drug: naloxone Place 1 spray into the nose as needed (accidental overdose).   nystatin-triamcinolone  cream Commonly known as: MYCOLOG II Apply topically 2 (two) times daily.   omeprazole 20 MG capsule Commonly known as: PRILOSEC Take 20 mg by mouth daily as needed (acid reflux).   ondansetron 8 MG disintegrating tablet Commonly known as: ZOFRAN-ODT Take 8 mg by mouth every 8 (eight) hours as needed for nausea or vomiting.   Oxycodone HCl 10 MG Tabs Take 10 mg by mouth in the morning, at noon, and at bedtime.   oxyCODONE-acetaminophen 10-325 MG tablet Commonly known as: PERCOCET Take 1 tablet by mouth every 6 (six) hours as needed.   pantoprazole 20 MG tablet Commonly known as: PROTONIX Take 20 mg by mouth daily.   sevelamer carbonate 800 MG tablet Commonly known as: RENVELA Take 1,600 mg by mouth 3 (three) times daily.   SUMAtriptan 100 MG tablet Commonly known as: IMITREX Take 100 mg by mouth as directed.  Allergies  Allergen Reactions   Morphine Shortness Of Breath and Anaphylaxis   Prednisone Other (See Comments)    Other reaction(s): Other (See Comments) Muscle spasms Patient says prednisone causes her to cramp all over, muscle spasms uncontrolled   Tuna [Fish Allergy] Itching, Swelling, Rash and Other (See Comments)    Face droops also   Amlodipine Other (See Comments)    Angioedema (09/05/17 ED visit)   Iodinated Contrast Media Itching   Tape Itching    Adhesive tape   paper tape ok    Consultations: Renal  Vascular surgery   Procedures/Studies: CT Angio Chest PE W and/or Wo Contrast  Result Date: 12/05/2021 CLINICAL DATA:  Dyspnea on exertion. EXAM: CT ANGIOGRAPHY CHEST WITH CONTRAST TECHNIQUE: Multidetector CT imaging of the chest was performed using the standard protocol during bolus administration of intravenous contrast. Multiplanar CT image reconstructions and MIPs were obtained to evaluate the vascular anatomy. RADIATION DOSE REDUCTION: This exam was performed according to the departmental dose-optimization program which includes  automated exposure control, adjustment of the mA and/or kV according to patient size and/or use of iterative reconstruction technique. CONTRAST:  27m OMNIPAQUE IOHEXOL 350 MG/ML SOLN COMPARISON:  January 11, 2021 FINDINGS: Cardiovascular: There is marked severity calcification of the aortic arch, without evidence of aortic aneurysm. Satisfactory opacification of the pulmonary arteries to the segmental level. No evidence of pulmonary embolism. There is mild cardiomegaly. A small pericardial effusion is noted. This represents a new finding when compared to the prior study. Mediastinum/Nodes: No enlarged mediastinal, hilar, or axillary lymph nodes. Thyroid gland, trachea, and esophagus demonstrate no significant findings. Lungs/Pleura: Diffuse interstitial thickening is noted with bilateral patchy, ill-defined ground-glass appearance of the lung parenchyma. Mild lateral right lower lobe infiltrate is seen. Mild posterior compressive atelectasis is also seen within the bilateral lower lobes. Small to moderate sized bilateral pleural effusions are noted. No pneumothorax is identified. Upper Abdomen: No acute abnormality. Musculoskeletal: No chest wall abnormality. No acute or significant osseous findings. Review of the MIP images confirms the above findings. IMPRESSION: 1. No evidence of pulmonary embolism. 2. Findings consistent with moderate severity pulmonary edema. 3. Mild lateral right lower lobe infiltrate. 4. Small to moderate sized bilateral pleural effusions with mild posterior lower lobe compressive atelectasis. Aortic Atherosclerosis (ICD10-I70.0). Electronically Signed   By: TVirgina NorfolkM.D.   On: 12/05/2021 02:08   DG Chest 2 View  Result Date: 12/04/2021 CLINICAL DATA:  Shortness of breath and lethargy. EXAM: CHEST - 2 VIEW COMPARISON:  PA Lat 11/09/2021 FINDINGS: There is mild cardiomegaly. There is interval increased central vascular engorgement and flow cephalization and increased generalized  interstitial edema. There are small pleural effusions. Hazy perihilar infiltrates are noted radiating outward consistent with alveolar edema, less likely pneumonia. Thoracic cage is intact. IMPRESSION: Cardiomegaly with moderate findings of CHF or fluid overload. Small pleural effusions. Perihilar opacities extending outward consistent with alveolar edema, less likely pneumonia. Progress chest films recommended depending on clinical response. Electronically Signed   By: KTelford NabM.D.   On: 12/04/2021 20:41   DG Chest 2 View  Result Date: 11/09/2021 CLINICAL DATA:  Shortness of breath for several weeks EXAM: CHEST - 2 VIEW COMPARISON:  02/15/2021 FINDINGS: Cardiac shadow is stable. The lungs are well aerated bilaterally. Diffuse increased vascular congestion is noted with mild edema. No focal infiltrate or effusion is seen. No bony abnormality is noted. IMPRESSION: Central vascular congestion and mild edema. This may be related to volume overload related to the end-stage renal  disease. Electronically Signed   By: Inez Catalina M.D.   On: 11/09/2021 22:04      Subjective: No significant events overnight, report dyspnea has resolved, actually on room air her oxygen saturation is 100% currently, the patient is irritated, wants to leave ASAP.  Discharge Exam: Vitals:   12/05/21 2323 12/06/21 0327  BP: (!) 162/67 138/72  Pulse: 92 90  Resp: 20 20  Temp: 98.3 F (36.8 C) 98.3 F (36.8 C)  SpO2: 96% 100%   Vitals:   12/05/21 1930 12/05/21 2323 12/06/21 0327 12/06/21 0400  BP: (!) 168/94 (!) 162/67 138/72   Pulse: 85 92 90   Resp: (!) _0 Temp: 98.3 F (36.8 C) 98.3 F (36.8 C) 98.3 F (36.8 C)   TempSrc: Oral Oral Oral   SpO2: 100% 96% 100%   Weight:    89.6 kg  Height:        General: Pt is alert, awake, not in acute distress Cardiovascular: RRR, S1/S2 +, no rubs, no gallops Respiratory: CTA bilaterally, no wheezing, no rhonchi Abdominal: Soft, NT, ND, bowel sounds  + Extremities: no edema, no cyanosis    The results of significant diagnostics from this hospitalization (including imaging, microbiology, ancillary and laboratory) are listed below for reference.     Microbiology: No results found for this or any previous visit (from the past 240 hour(s)).   Labs: BNP (last 3 results) Recent Labs    01/11/21 1820 11/09/21 2119 12/04/21 2150  BNP 1,051.8* 314.9* 195.0*   Basic Metabolic Panel: Recent Labs  Lab 12/04/21 2112 12/04/21 2150 12/05/21 0917 12/06/21 0654  NA 138 139 135 139  K 3.9 4.1 4.7 3.7  CL  --  95* 94* 100  CO2  --  _1 GLUCOSE  --  79 151* 105*  BUN  --  40* 45* 37*  CREATININE  --  13.43* 14.52* 9.77*  CALCIUM  --  10.2 9.4 8.8*  PHOS  --   --  7.7*  --    Liver Function Tests: Recent Labs  Lab 12/04/21 2150  AST 11*  ALT 7  ALKPHOS 36*  BILITOT 0.5  PROT 7.2  ALBUMIN 3.6   Recent Labs  Lab 12/04/21 2150  LIPASE 84*   No results for input(s): "AMMONIA" in the last 168 hours. CBC: Recent Labs  Lab 12/04/21 2112 12/04/21 2150 12/05/21 0917 12/06/21 0654  WBC  --  6.8 6.0 6.7  NEUTROABS  --  4.3  --   --   HGB 8.5* 7.9* 7.8* 7.8*  HCT 25.0* 26.5* 25.1* 26.0*  MCV  --  89.8 88.7 90.3  PLT  --  263 219 246   Cardiac Enzymes: No results for input(s): "CKTOTAL", "CKMB", "CKMBINDEX", "TROPONINI" in the last 168 hours. BNP: Invalid input(s): "POCBNP" CBG: No results for input(s): "GLUCAP" in the last 168 hours. D-Dimer No results for input(s): "DDIMER" in the last 72 hours. Hgb A1c No results for input(s): "HGBA1C" in the last 72 hours. Lipid Profile No results for input(s): "CHOL", "HDL", "LDLCALC", "TRIG", "CHOLHDL", "LDLDIRECT" in the last 72 hours. Thyroid function studies No results for input(s): "TSH", "T4TOTAL", "T3FREE", "THYROIDAB" in the last 72 hours.  Invalid input(s): "FREET3" Anemia work up Recent Labs    12/05/21 0917  TIBC 263  IRON 33   Urinalysis     Component Value Date/Time   COLORURINE YELLOW 12/20/2013 0530   APPEARANCEUR CLOUDY (A) 12/20/2013 0530   LABSPEC 1.009 12/20/2013  0530   PHURINE 8.0 12/20/2013 0530   GLUCOSEU 100 (A) 12/20/2013 0530   HGBUR LARGE (A) 12/20/2013 0530   BILIRUBINUR NEGATIVE 12/20/2013 0530   KETONESUR NEGATIVE 12/20/2013 0530   PROTEINUR 100 (A) 12/20/2013 0530   UROBILINOGEN 0.2 12/20/2013 0530   NITRITE NEGATIVE 12/20/2013 0530   LEUKOCYTESUR TRACE (A) 12/20/2013 0530   Sepsis Labs Recent Labs  Lab 12/04/21 2150 12/05/21 0917 12/06/21 0654  WBC 6.8 6.0 6.7   Microbiology No results found for this or any previous visit (from the past 240 hour(s)).   Time coordinating discharge: Over 30 minutes  SIGNED:   Phillips Climes, MD  [facility] 12/06/2021, 10:06 AM Pager   If 7PM-7AM, please contact night-coverage

## 2021-12-06 NOTE — Telephone Encounter (Signed)
Hospital medicine Dr Waldron Labs reports that patient was discharged today and noted pulse ox 100% on room air, but refused to be examined for ambulatory pulse ox and rushed home, therefore she was not qualified for any oxygen needs.  I have relayed this information to the patient.  She was frustrated and states she was not given the order of oxygen supply.  I have encouraged hospital medicine provider to call the patient for further discussion on this matter. Dr Waldron Labs states patient was arranged/recommended oxygen by Dr. Harrell Gave on 12/04/2021, at the time patient was noted with ambulatory hypoxia.  Cardiology was not consulted this admission and was not involved with her care.  If based on today's assessment that she does not require oxygen, discharging team should communicate with the patient.

## 2021-12-06 NOTE — Discharge Instructions (Signed)
Follow with Primary MD Harvie Junior, MD in 7 days   Get CBC, CMP, 2 view Chest X ray checked  by Primary MD next visit.    Activity: As tolerated with Full fall precautions use walker/cane & assistance as needed   Disposition Home    Diet: Renal Diet with 1200 cc fluid restriction   On your next visit with your primary care physician please Get Medicines reviewed and adjusted.   Please request your Prim.MD to go over all Hospital Tests and Procedure/Radiological results at the follow up, please get all Hospital records sent to your Prim MD by signing hospital release before you go home.   If you experience worsening of your admission symptoms, develop shortness of breath, life threatening emergency, suicidal or homicidal thoughts you must seek medical attention immediately by calling 911 or calling your MD immediately  if symptoms less severe.  You Must read complete instructions/literature along with all the possible adverse reactions/side effects for all the Medicines you take and that have been prescribed to you. Take any new Medicines after you have completely understood and accpet all the possible adverse reactions/side effects.   Do not drive, operating heavy machinery, perform activities at heights, swimming or participation in water activities or provide baby sitting services if your were admitted for syncope or siezures until you have seen by Primary MD or a Neurologist and advised to do so again.  Do not drive when taking Pain medications.    Do not take more than prescribed Pain, Sleep and Anxiety Medications  Special Instructions: If you have smoked or chewed Tobacco  in the last 2 yrs please stop smoking, stop any regular Alcohol  and or any Recreational drug use.  Wear Seat belts while driving.   Please note  You were cared for by a hospitalist during your hospital stay. If you have any questions about your discharge medications or the care you received while  you were in the hospital after you are discharged, you can call the unit and asked to speak with the hospitalist on call if the hospitalist that took care of you is not available. Once you are discharged, your primary care physician will handle any further medical issues. Please note that NO REFILLS for any discharge medications will be authorized once you are discharged, as it is imperative that you return to your primary care physician (or establish a relationship with a primary care physician if you do not have one) for your aftercare needs so that they can reassess your need for medications and monitor your lab values.

## 2021-12-06 NOTE — Care Management (Signed)
Referral made to [employer] to assess ABG from 9/21 for home oxygen qualifications.  Liaison verified that patient would need qualifying ambulatory sats documented in order to receive home oxygen.  Spoke w Dr Waldron Labs to inform him that home oxygen cannot be set up this time unless patient agrees to stay for ambulatory sats to be done and documented. Desiree [name]

## 2021-12-06 NOTE — Telephone Encounter (Signed)
Patient called after hour line, states she was discharged to home today, but was never provided with oxygen supply. Advised the patient to talk to the nurse who released her from the floor. Message sent to Dr Waldron Labs, [facility]  for patient's concern. We have not seen this patient during the past hospitalization nor recommended oxygent therapy.

## 2021-12-08 ENCOUNTER — Telehealth: Payer: Self-pay

## 2021-12-08 NOTE — Telephone Encounter (Signed)
Contacted patient to schedule her for a left arm fistulogram per staff message from Dr. Stanford Breed. Patient stated that she already has an appointment scheduled for one on 12/10/21 with another provider.

## 2021-12-09 NOTE — Telephone Encounter (Signed)
Called to discuss scheduling the Echocardiogram ordered by Dr. Erma Heritage answer and mail box is full

## 2021-12-15 ENCOUNTER — Other Ambulatory Visit: Payer: Self-pay

## 2021-12-15 ENCOUNTER — Telehealth: Payer: Self-pay

## 2021-12-15 DIAGNOSIS — R58 Hemorrhage, not elsewhere classified: Secondary | ICD-10-CM

## 2021-12-15 DIAGNOSIS — N186 End stage renal disease: Secondary | ICD-10-CM

## 2021-12-15 NOTE — Telephone Encounter (Signed)
Left message for patient to call and discuss scheduling the Echocardiogram ordered by Dr. Christopher 

## 2021-12-15 NOTE — Telephone Encounter (Signed)
Returned call to patient in regards to message left stating "pocket on my arm busted". While on the phone pt stated she called 911 on yesterday and they came assessed her L arm and stated "it looked fine" and that she did not need to go to the ED. Pt reports 10/10 pain, scant clear drainage from site. She denies fever and redness at the site but states the site is warm. Also states that her L arm is bruised . She states she is taking Oxycodone 10mg  and its not helping her pain. I advised that it may not completely take the pain away but should make it bearable. I advised her to cover the area loosely with a sterile gauze and keep it elevated above the level of her heart. Pt was given sign and symptoms of infection to observe for and if any presented to the nearest ED. I also made pt aware that I would speak with our surgery scheduler in office to have her contact her to schedule recommended surgery.   Surgery scheduler was contacted, procedure needed was verified but I was unable to reach the patient at the same number I had just contacted her on. Voice mail was full and was unable to leave a message.

## 2021-12-19 ENCOUNTER — Observation Stay (HOSPITAL_COMMUNITY): Payer: 59

## 2021-12-19 ENCOUNTER — Other Ambulatory Visit: Payer: Self-pay

## 2021-12-19 ENCOUNTER — Ambulatory Visit (HOSPITAL_BASED_OUTPATIENT_CLINIC_OR_DEPARTMENT_OTHER): Payer: 59 | Admitting: Certified Registered Nurse Anesthetist

## 2021-12-19 ENCOUNTER — Ambulatory Visit (HOSPITAL_COMMUNITY): Payer: 59 | Admitting: Certified Registered Nurse Anesthetist

## 2021-12-19 ENCOUNTER — Encounter (HOSPITAL_COMMUNITY)
Admission: RE | Disposition: A | Discharge status: Home / Self Care | Payer: Self-pay | Source: Home / Self Care | Attending: Internal Medicine

## 2021-12-19 ENCOUNTER — Inpatient Hospital Stay (HOSPITAL_COMMUNITY)
Admission: RE | Admit: 2021-12-19 | Discharge: 2021-12-22 | DRG: 314 | Disposition: A | Discharge status: Home / Self Care | Payer: 59 | Attending: Internal Medicine | Admitting: Internal Medicine

## 2021-12-19 ENCOUNTER — Ambulatory Visit (HOSPITAL_COMMUNITY): Payer: 59

## 2021-12-19 ENCOUNTER — Encounter (HOSPITAL_COMMUNITY): Payer: Self-pay | Admitting: Vascular Surgery

## 2021-12-19 DIAGNOSIS — Y712 Prosthetic and other implants, materials and accessory cardiovascular devices associated with adverse incidents: Secondary | ICD-10-CM | POA: Diagnosis present

## 2021-12-19 DIAGNOSIS — E8770 Fluid overload, unspecified: Secondary | ICD-10-CM | POA: Diagnosis present

## 2021-12-19 DIAGNOSIS — Z992 Dependence on renal dialysis: Secondary | ICD-10-CM | POA: Diagnosis not present

## 2021-12-19 DIAGNOSIS — D631 Anemia in chronic kidney disease: Secondary | ICD-10-CM

## 2021-12-19 DIAGNOSIS — R634 Abnormal weight loss: Secondary | ICD-10-CM | POA: Diagnosis present

## 2021-12-19 DIAGNOSIS — M109 Gout, unspecified: Secondary | ICD-10-CM | POA: Diagnosis present

## 2021-12-19 DIAGNOSIS — J9621 Acute and chronic respiratory failure with hypoxia: Secondary | ICD-10-CM | POA: Diagnosis present

## 2021-12-19 DIAGNOSIS — N186 End stage renal disease: Secondary | ICD-10-CM

## 2021-12-19 DIAGNOSIS — Z91041 Radiographic dye allergy status: Secondary | ICD-10-CM

## 2021-12-19 DIAGNOSIS — Z885 Allergy status to narcotic agent status: Secondary | ICD-10-CM

## 2021-12-19 DIAGNOSIS — F418 Other specified anxiety disorders: Secondary | ICD-10-CM

## 2021-12-19 DIAGNOSIS — Z6832 Body mass index (BMI) 32.0-32.9, adult: Secondary | ICD-10-CM

## 2021-12-19 DIAGNOSIS — T827XXA Infection and inflammatory reaction due to other cardiac and vascular devices, implants and grafts, initial encounter: Secondary | ICD-10-CM

## 2021-12-19 DIAGNOSIS — M898X9 Other specified disorders of bone, unspecified site: Secondary | ICD-10-CM | POA: Diagnosis present

## 2021-12-19 DIAGNOSIS — I120 Hypertensive chronic kidney disease with stage 5 chronic kidney disease or end stage renal disease: Secondary | ICD-10-CM | POA: Diagnosis not present

## 2021-12-19 DIAGNOSIS — Z79899 Other long term (current) drug therapy: Secondary | ICD-10-CM

## 2021-12-19 DIAGNOSIS — Z833 Family history of diabetes mellitus: Secondary | ICD-10-CM

## 2021-12-19 DIAGNOSIS — Z8249 Family history of ischemic heart disease and other diseases of the circulatory system: Secondary | ICD-10-CM

## 2021-12-19 DIAGNOSIS — E785 Hyperlipidemia, unspecified: Secondary | ICD-10-CM | POA: Diagnosis present

## 2021-12-19 DIAGNOSIS — G43909 Migraine, unspecified, not intractable, without status migrainosus: Secondary | ICD-10-CM | POA: Diagnosis present

## 2021-12-19 DIAGNOSIS — E66811 Obesity, class 1: Secondary | ICD-10-CM | POA: Diagnosis present

## 2021-12-19 DIAGNOSIS — Z888 Allergy status to other drugs, medicaments and biological substances status: Secondary | ICD-10-CM

## 2021-12-19 DIAGNOSIS — T82898A Other specified complication of vascular prosthetic devices, implants and grafts, initial encounter: Secondary | ICD-10-CM | POA: Diagnosis not present

## 2021-12-19 DIAGNOSIS — I2510 Atherosclerotic heart disease of native coronary artery without angina pectoris: Secondary | ICD-10-CM | POA: Diagnosis present

## 2021-12-19 DIAGNOSIS — E669 Obesity, unspecified: Secondary | ICD-10-CM | POA: Diagnosis present

## 2021-12-19 DIAGNOSIS — T82838A Hemorrhage of vascular prosthetic devices, implants and grafts, initial encounter: Principal | ICD-10-CM | POA: Diagnosis present

## 2021-12-19 DIAGNOSIS — Z91048 Other nonmedicinal substance allergy status: Secondary | ICD-10-CM

## 2021-12-19 DIAGNOSIS — L089 Local infection of the skin and subcutaneous tissue, unspecified: Secondary | ICD-10-CM | POA: Diagnosis present

## 2021-12-19 DIAGNOSIS — Y841 Kidney dialysis as the cause of abnormal reaction of the patient, or of later complication, without mention of misadventure at the time of the procedure: Secondary | ICD-10-CM | POA: Diagnosis present

## 2021-12-19 DIAGNOSIS — F419 Anxiety disorder, unspecified: Secondary | ICD-10-CM | POA: Diagnosis present

## 2021-12-19 DIAGNOSIS — Z83438 Family history of other disorder of lipoprotein metabolism and other lipidemia: Secondary | ICD-10-CM

## 2021-12-19 DIAGNOSIS — J449 Chronic obstructive pulmonary disease, unspecified: Secondary | ICD-10-CM | POA: Diagnosis present

## 2021-12-19 DIAGNOSIS — I5022 Chronic systolic (congestive) heart failure: Secondary | ICD-10-CM | POA: Insufficient documentation

## 2021-12-19 DIAGNOSIS — N185 Chronic kidney disease, stage 5: Secondary | ICD-10-CM | POA: Diagnosis not present

## 2021-12-19 DIAGNOSIS — R58 Hemorrhage, not elsewhere classified: Secondary | ICD-10-CM

## 2021-12-19 DIAGNOSIS — E875 Hyperkalemia: Secondary | ICD-10-CM | POA: Diagnosis present

## 2021-12-19 DIAGNOSIS — I509 Heart failure, unspecified: Secondary | ICD-10-CM

## 2021-12-19 DIAGNOSIS — I5031 Acute diastolic (congestive) heart failure: Secondary | ICD-10-CM | POA: Diagnosis present

## 2021-12-19 DIAGNOSIS — K219 Gastro-esophageal reflux disease without esophagitis: Secondary | ICD-10-CM | POA: Diagnosis present

## 2021-12-19 DIAGNOSIS — F32A Depression, unspecified: Secondary | ICD-10-CM | POA: Diagnosis present

## 2021-12-19 DIAGNOSIS — I132 Hypertensive heart and chronic kidney disease with heart failure and with stage 5 chronic kidney disease, or end stage renal disease: Secondary | ICD-10-CM | POA: Diagnosis present

## 2021-12-19 DIAGNOSIS — I160 Hypertensive urgency: Secondary | ICD-10-CM | POA: Diagnosis present

## 2021-12-19 HISTORY — PX: A/V FISTULAGRAM: CATH118298

## 2021-12-19 HISTORY — PX: INSERTION OF DIALYSIS CATHETER: SHX1324

## 2021-12-19 HISTORY — PX: AV FISTULA PLACEMENT: SHX1204

## 2021-12-19 LAB — HEPATITIS B CORE ANTIBODY, TOTAL: Hep B Core Total Ab: NONREACTIVE

## 2021-12-19 LAB — HEPATITIS B SURFACE ANTIGEN: Hepatitis B Surface Ag: NONREACTIVE

## 2021-12-19 LAB — POCT I-STAT, CHEM 8
BUN: 41 mg/dL — ABNORMAL HIGH (ref 6–20)
Calcium, Ion: 1.13 mmol/L — ABNORMAL LOW (ref 1.15–1.40)
Chloride: 97 mmol/L — ABNORMAL LOW (ref 98–111)
Creatinine, Ser: 15 mg/dL — ABNORMAL HIGH (ref 0.44–1.00)
Glucose, Bld: 80 mg/dL (ref 70–99)
HCT: 23 % — ABNORMAL LOW (ref 36.0–46.0)
Hemoglobin: 7.8 g/dL — ABNORMAL LOW (ref 12.0–15.0)
Potassium: 4 mmol/L (ref 3.5–5.1)
Sodium: 138 mmol/L (ref 135–145)
TCO2: 28 mmol/L (ref 22–32)

## 2021-12-19 LAB — HCG, SERUM, QUALITATIVE: Preg, Serum: NEGATIVE

## 2021-12-19 LAB — HEPATITIS C ANTIBODY: HCV Ab: NONREACTIVE

## 2021-12-19 LAB — HEPATITIS B SURFACE ANTIBODY,QUALITATIVE: Hep B S Ab: REACTIVE — AB

## 2021-12-19 SURGERY — A/V FISTULAGRAM
Anesthesia: LOCAL | Laterality: Left

## 2021-12-19 SURGERY — ARTERIOVENOUS (AV) FISTULA CREATION
Anesthesia: General | Site: Neck | Laterality: Right

## 2021-12-19 MED ORDER — CALCIUM ACETATE (PHOS BINDER) 667 MG PO CAPS: 667.0000 mg | ORAL_CAPSULE | ORAL | Status: DC | PRN

## 2021-12-19 MED ORDER — HYDRALAZINE HCL 20 MG/ML IJ SOLN: 5.0000 mg | Freq: Four times a day (QID) | INTRAMUSCULAR | Status: DC | PRN

## 2021-12-19 MED ORDER — HEPARIN (PORCINE) IN NACL 1000-0.9 UT/500ML-% IV SOLN
INTRAVENOUS | Status: AC
  Filled 2021-12-19: qty 500

## 2021-12-19 MED ORDER — ACETAMINOPHEN 500 MG PO TABS: 1000.0000 mg | ORAL_TABLET | Freq: Once | ORAL | Status: DC

## 2021-12-19 MED ORDER — 0.9 % SODIUM CHLORIDE (POUR BTL) OPTIME
TOPICAL | Status: DC | PRN
  Administered 2021-12-19: 1000 mL

## 2021-12-19 MED ORDER — CHLORHEXIDINE GLUCONATE 0.12 % MT SOLN
OROMUCOSAL | Status: AC
  Administered 2021-12-19: 15 mL via OROMUCOSAL
  Filled 2021-12-19: qty 15

## 2021-12-19 MED ORDER — FENTANYL CITRATE (PF) 250 MCG/5ML IJ SOLN
INTRAMUSCULAR | Status: AC
  Filled 2021-12-19: qty 5

## 2021-12-19 MED ORDER — VANCOMYCIN HCL 2000 MG/400ML IV SOLN
2000.0000 mg | INTRAVENOUS | Status: AC
  Administered 2021-12-19: 2000 mg via INTRAVENOUS
  Filled 2021-12-19: qty 400

## 2021-12-19 MED ORDER — DEXAMETHASONE SODIUM PHOSPHATE 10 MG/ML IJ SOLN
INTRAMUSCULAR | Status: AC
  Filled 2021-12-19: qty 3

## 2021-12-19 MED ORDER — ONDANSETRON 4 MG PO TBDP
8.0000 mg | ORAL_TABLET | Freq: Three times a day (TID) | ORAL | Status: DC | PRN
  Administered 2021-12-19 – 2021-12-22 (×4): 8 mg via ORAL
  Filled 2021-12-19 (×7): qty 2

## 2021-12-19 MED ORDER — HYDROMORPHONE HCL 1 MG/ML IJ SOLN: 0.2500 mg | INTRAMUSCULAR | Status: DC | PRN

## 2021-12-19 MED ORDER — CALCIUM ACETATE (PHOS BINDER) 667 MG PO CAPS
2001.0000 mg | ORAL_CAPSULE | Freq: Three times a day (TID) | ORAL | Status: DC
  Administered 2021-12-19 – 2021-12-22 (×8): 2001 mg via ORAL
  Filled 2021-12-19 (×8): qty 3

## 2021-12-19 MED ORDER — ONDANSETRON HCL 4 MG/2ML IJ SOLN
INTRAMUSCULAR | Status: AC
  Filled 2021-12-19: qty 6

## 2021-12-19 MED ORDER — ORAL CARE MOUTH RINSE: 15.0000 mL | Freq: Once | OROMUCOSAL | Status: AC

## 2021-12-19 MED ORDER — CINACALCET HCL 30 MG PO TABS
60.0000 mg | ORAL_TABLET | Freq: Every evening | ORAL | Status: DC
  Administered 2021-12-19 – 2021-12-22 (×4): 60 mg via ORAL
  Filled 2021-12-19 (×4): qty 2

## 2021-12-19 MED ORDER — SODIUM CHLORIDE 0.9 % IV SOLN: 250.0000 mL | INTRAVENOUS | Status: DC | PRN

## 2021-12-19 MED ORDER — HEPARIN 6000 UNIT IRRIGATION SOLUTION
Status: AC
  Filled 2021-12-19: qty 500

## 2021-12-19 MED ORDER — SODIUM CHLORIDE 0.9% FLUSH: 3.0000 mL | INTRAVENOUS | Status: DC | PRN

## 2021-12-19 MED ORDER — HEPARIN SODIUM (PORCINE) 1000 UNIT/ML IJ SOLN
INTRAMUSCULAR | Status: DC | PRN
  Administered 2021-12-19: 3200 [IU]

## 2021-12-19 MED ORDER — SODIUM CHLORIDE 0.9% FLUSH
3.0000 mL | Freq: Two times a day (BID) | INTRAVENOUS | Status: DC
  Administered 2021-12-19 – 2021-12-22 (×6): 3 mL via INTRAVENOUS

## 2021-12-19 MED ORDER — ROCURONIUM BROMIDE 10 MG/ML (PF) SYRINGE
PREFILLED_SYRINGE | INTRAVENOUS | Status: DC | PRN
  Administered 2021-12-19: 50 mg via INTRAVENOUS

## 2021-12-19 MED ORDER — FAMOTIDINE IN NACL 20-0.9 MG/50ML-% IV SOLN
20.0000 mg | INTRAVENOUS | Status: AC
  Administered 2021-12-19: 20 mg via INTRAVENOUS
  Filled 2021-12-19: qty 50

## 2021-12-19 MED ORDER — DIPHENHYDRAMINE HCL 50 MG/ML IJ SOLN
25.0000 mg | INTRAMUSCULAR | Status: AC
  Administered 2021-12-19: 25 mg via INTRAVENOUS
  Filled 2021-12-19: qty 1

## 2021-12-19 MED ORDER — SODIUM CHLORIDE 0.9 % IV SOLN: INTRAVENOUS | Status: DC

## 2021-12-19 MED ORDER — ACETAMINOPHEN 325 MG PO TABS: 650.0000 mg | ORAL_TABLET | ORAL | Status: DC | PRN

## 2021-12-19 MED ORDER — ALBUTEROL SULFATE (2.5 MG/3ML) 0.083% IN NEBU: 2.5000 mg | INHALATION_SOLUTION | RESPIRATORY_TRACT | Status: DC | PRN

## 2021-12-19 MED ORDER — LACTULOSE 10 GM/15ML PO SOLN: 20.0000 g | Freq: Every day | ORAL | Status: DC | PRN

## 2021-12-19 MED ORDER — VANCOMYCIN HCL IN DEXTROSE 1-5 GM/200ML-% IV SOLN
1000.0000 mg | INTRAVENOUS | Status: DC
  Administered 2021-12-20: 1000 mg via INTRAVENOUS
  Filled 2021-12-19 (×2): qty 200

## 2021-12-19 MED ORDER — NEOSTIGMINE METHYLSULFATE 10 MG/10ML IV SOLN
INTRAVENOUS | Status: DC | PRN
  Administered 2021-12-19: 3 mg via INTRAVENOUS

## 2021-12-19 MED ORDER — ONDANSETRON HCL 4 MG/2ML IJ SOLN
INTRAMUSCULAR | Status: DC | PRN
  Administered 2021-12-19: 4 mg via INTRAVENOUS

## 2021-12-19 MED ORDER — HEPARIN SODIUM (PORCINE) 1000 UNIT/ML IJ SOLN
INTRAMUSCULAR | Status: AC
  Filled 2021-12-19: qty 10

## 2021-12-19 MED ORDER — PIPERACILLIN-TAZOBACTAM IN DEX 2-0.25 GM/50ML IV SOLN
2.2500 g | Freq: Three times a day (TID) | INTRAVENOUS | Status: DC
  Administered 2021-12-19 – 2021-12-21 (×6): 2.25 g via INTRAVENOUS
  Filled 2021-12-19 (×9): qty 50

## 2021-12-19 MED ORDER — PROPOFOL 10 MG/ML IV BOLUS
INTRAVENOUS | Status: DC | PRN
  Administered 2021-12-19: 150 mg via INTRAVENOUS

## 2021-12-19 MED ORDER — CHLORHEXIDINE GLUCONATE CLOTH 2 % EX PADS
6.0000 | MEDICATED_PAD | Freq: Every day | CUTANEOUS | Status: DC
  Administered 2021-12-20 – 2021-12-21 (×2): 6 via TOPICAL

## 2021-12-19 MED ORDER — EPHEDRINE 5 MG/ML INJ
INTRAVENOUS | Status: AC
  Filled 2021-12-19: qty 5

## 2021-12-19 MED ORDER — SUMATRIPTAN SUCCINATE 100 MG PO TABS: 100.0000 mg | ORAL_TABLET | ORAL | Status: DC | PRN

## 2021-12-19 MED ORDER — FUROSEMIDE 10 MG/ML IJ SOLN: 80.0000 mg | Freq: Once | INTRAMUSCULAR | Status: DC

## 2021-12-19 MED ORDER — DIPHENHYDRAMINE HCL 25 MG PO CAPS: 25.0000 mg | ORAL_CAPSULE | Freq: Four times a day (QID) | ORAL | Status: DC | PRN

## 2021-12-19 MED ORDER — HEPARIN 6000 UNIT IRRIGATION SOLUTION
Status: DC | PRN
  Administered 2021-12-19: 1

## 2021-12-19 MED ORDER — PROPOFOL 10 MG/ML IV BOLUS
INTRAVENOUS | Status: AC
  Filled 2021-12-19: qty 20

## 2021-12-19 MED ORDER — SEVELAMER CARBONATE 800 MG PO TABS
1600.0000 mg | ORAL_TABLET | Freq: Three times a day (TID) | ORAL | Status: DC
  Administered 2021-12-19 – 2021-12-22 (×8): 1600 mg via ORAL
  Filled 2021-12-19 (×8): qty 2

## 2021-12-19 MED ORDER — LIDOCAINE HCL (PF) 1 % IJ SOLN
INTRAMUSCULAR | Status: DC | PRN
  Administered 2021-12-19: 5 mL via INTRADERMAL

## 2021-12-19 MED ORDER — CHLORHEXIDINE GLUCONATE 0.12 % MT SOLN: 15.0000 mL | Freq: Once | OROMUCOSAL | Status: AC

## 2021-12-19 MED ORDER — MIDAZOLAM HCL 2 MG/2ML IJ SOLN
INTRAMUSCULAR | Status: AC
  Filled 2021-12-19: qty 2

## 2021-12-19 MED ORDER — FENTANYL CITRATE (PF) 250 MCG/5ML IJ SOLN
INTRAMUSCULAR | Status: DC | PRN
  Administered 2021-12-19: 50 ug via INTRAVENOUS
  Administered 2021-12-19: 100 ug via INTRAVENOUS

## 2021-12-19 MED ORDER — ASPIRIN 81 MG PO TBEC
81.0000 mg | DELAYED_RELEASE_TABLET | Freq: Every day | ORAL | Status: DC
  Administered 2021-12-19 – 2021-12-22 (×4): 81 mg via ORAL
  Filled 2021-12-19 (×4): qty 1

## 2021-12-19 MED ORDER — LIDOCAINE 2% (20 MG/ML) 5 ML SYRINGE
INTRAMUSCULAR | Status: DC | PRN
  Administered 2021-12-19: 60 mg via INTRAVENOUS

## 2021-12-19 MED ORDER — DEXAMETHASONE SODIUM PHOSPHATE 10 MG/ML IJ SOLN
INTRAMUSCULAR | Status: DC | PRN
  Administered 2021-12-19: 8 mg via INTRAVENOUS

## 2021-12-19 MED ORDER — ALPRAZOLAM 0.5 MG PO TABS
1.0000 mg | ORAL_TABLET | Freq: Three times a day (TID) | ORAL | Status: DC | PRN
  Administered 2021-12-19 – 2021-12-22 (×8): 1 mg via ORAL
  Filled 2021-12-19 (×8): qty 2

## 2021-12-19 MED ORDER — CEFAZOLIN SODIUM-DEXTROSE 2-4 GM/100ML-% IV SOLN
INTRAVENOUS | Status: AC
  Filled 2021-12-19: qty 100

## 2021-12-19 MED ORDER — IODIXANOL 320 MG/ML IV SOLN
INTRAVENOUS | Status: DC | PRN
  Administered 2021-12-19: 20 mL

## 2021-12-19 MED ORDER — PANTOPRAZOLE SODIUM 40 MG PO TBEC
40.0000 mg | DELAYED_RELEASE_TABLET | Freq: Every day | ORAL | Status: DC
  Administered 2021-12-19 – 2021-12-22 (×4): 40 mg via ORAL
  Filled 2021-12-19 (×4): qty 1

## 2021-12-19 MED ORDER — OXYCODONE HCL 5 MG PO TABS
10.0000 mg | ORAL_TABLET | Freq: Four times a day (QID) | ORAL | Status: DC | PRN
  Administered 2021-12-19 – 2021-12-22 (×9): 10 mg via ORAL
  Filled 2021-12-19 (×9): qty 2

## 2021-12-19 MED ORDER — HEPARIN (PORCINE) IN NACL 1000-0.9 UT/500ML-% IV SOLN
INTRAVENOUS | Status: DC | PRN
  Administered 2021-12-19: 500 mL

## 2021-12-19 MED ORDER — SODIUM CHLORIDE 0.9% FLUSH: 3.0000 mL | Freq: Two times a day (BID) | INTRAVENOUS | Status: DC

## 2021-12-19 MED ORDER — GLYCOPYRROLATE PF 0.2 MG/ML IJ SOSY
PREFILLED_SYRINGE | INTRAMUSCULAR | Status: DC | PRN
  Administered 2021-12-19: .3 mg via INTRAVENOUS

## 2021-12-19 MED ORDER — LIDOCAINE HCL (PF) 1 % IJ SOLN
INTRAMUSCULAR | Status: AC
  Filled 2021-12-19: qty 30

## 2021-12-19 MED ORDER — HEPARIN SODIUM (PORCINE) 5000 UNIT/ML IJ SOLN
5000.0000 [IU] | Freq: Two times a day (BID) | INTRAMUSCULAR | Status: DC
  Administered 2021-12-20 – 2021-12-21 (×4): 5000 [IU] via SUBCUTANEOUS
  Filled 2021-12-19 (×6): qty 1

## 2021-12-19 SURGICAL SUPPLY — 47 items
ARMBAND PINK RESTRICT EXTREMIT (MISCELLANEOUS) ×2 IMPLANT
BAG COUNTER SPONGE SURGICOUNT (BAG) ×2 IMPLANT
BIOPATCH RED 1 DISK 7.0 (GAUZE/BANDAGES/DRESSINGS) IMPLANT
BNDG ELASTIC 4X5.8 VLCR STR LF (GAUZE/BANDAGES/DRESSINGS) IMPLANT
BNDG GAUZE DERMACEA FLUFF 4 (GAUZE/BANDAGES/DRESSINGS) IMPLANT
CANISTER SUCT 3000ML PPV (MISCELLANEOUS) ×2 IMPLANT
CATH PALINDROME-P 19CM W/VT (CATHETERS) IMPLANT
CLIP LIGATING EXTRA MED SLVR (CLIP) ×2 IMPLANT
CLIP LIGATING EXTRA SM BLUE (MISCELLANEOUS) ×2 IMPLANT
COVER PROBE W GEL 5X96 (DRAPES) IMPLANT
DERMABOND ADVANCED .7 DNX12 (GAUZE/BANDAGES/DRESSINGS) ×2 IMPLANT
DRSG COVADERM 4X6 (GAUZE/BANDAGES/DRESSINGS) IMPLANT
ELECT REM PT RETURN 9FT ADLT (ELECTROSURGICAL) ×2
ELECTRODE REM PT RTRN 9FT ADLT (ELECTROSURGICAL) ×2 IMPLANT
GLOVE BIO SURGEON STRL SZ 6.5 (GLOVE) IMPLANT
GLOVE BIO SURGEON STRL SZ7.5 (GLOVE) ×2 IMPLANT
GLOVE BIOGEL PI IND STRL 7.0 (GLOVE) IMPLANT
GLOVE ECLIPSE 7.0 STRL STRAW (GLOVE) IMPLANT
GLOVE SS BIOGEL STRL SZ 7 (GLOVE) IMPLANT
GOWN STRL REUS W/ TWL LRG LVL3 (GOWN DISPOSABLE) ×4 IMPLANT
GOWN STRL REUS W/ TWL XL LVL3 (GOWN DISPOSABLE) ×2 IMPLANT
GOWN STRL REUS W/TWL LRG LVL3 (GOWN DISPOSABLE) ×4
GOWN STRL REUS W/TWL XL LVL3 (GOWN DISPOSABLE) ×2
INSERT FOGARTY SM (MISCELLANEOUS) IMPLANT
KIT BASIN OR (CUSTOM PROCEDURE TRAY) ×2 IMPLANT
KIT TURNOVER KIT B (KITS) ×2 IMPLANT
NS IRRIG 1000ML POUR BTL (IV SOLUTION) ×2 IMPLANT
PACK CV ACCESS (CUSTOM PROCEDURE TRAY) ×2 IMPLANT
PAD ARMBOARD 7.5X6 YLW CONV (MISCELLANEOUS) ×4 IMPLANT
SLING ARM FOAM STRAP LRG (SOFTGOODS) IMPLANT
SLING ARM FOAM STRAP MED (SOFTGOODS) IMPLANT
STAPLER VISISTAT 35W (STAPLE) IMPLANT
SUT ETHILON 3 0 PS 1 (SUTURE) IMPLANT
SUT MNCRL AB 4-0 PS2 18 (SUTURE) ×2 IMPLANT
SUT PROLENE 4 0 RB 1 (SUTURE) ×2
SUT PROLENE 4-0 RB1 .5 CRCL 36 (SUTURE) IMPLANT
SUT PROLENE 5 0 C 1 24 (SUTURE) IMPLANT
SUT PROLENE 6 0 BV (SUTURE) ×2 IMPLANT
SUT VIC AB 2-0 CT1 27 (SUTURE) ×2
SUT VIC AB 2-0 CT1 TAPERPNT 27 (SUTURE) IMPLANT
SUT VIC AB 3-0 SH 27 (SUTURE) ×2
SUT VIC AB 3-0 SH 27X BRD (SUTURE) ×2 IMPLANT
SWAB CULTURE ESWAB REG 1ML (MISCELLANEOUS) IMPLANT
SWAB CULTURE LIQ STUART DBL (MISCELLANEOUS) IMPLANT
TOWEL GREEN STERILE (TOWEL DISPOSABLE) ×2 IMPLANT
UNDERPAD 30X36 HEAVY ABSORB (UNDERPADS AND DIAPERS) ×2 IMPLANT
WATER STERILE IRR 1000ML POUR (IV SOLUTION) ×2 IMPLANT

## 2021-12-19 SURGICAL SUPPLY — 9 items
BAG SNAP BAND KOVER 36X36 (MISCELLANEOUS) ×1 IMPLANT
COVER DOME SNAP 22 D (MISCELLANEOUS) ×1 IMPLANT
KIT MICROPUNCTURE NIT STIFF (SHEATH) IMPLANT
PROTECTION STATION PRESSURIZED (MISCELLANEOUS) ×1
SHEATH PROBE COVER 6X72 (BAG) ×1 IMPLANT
STATION PROTECTION PRESSURIZED (MISCELLANEOUS) ×1 IMPLANT
STOPCOCK MORSE 400PSI 3WAY (MISCELLANEOUS) ×1 IMPLANT
TRAY PV CATH (CUSTOM PROCEDURE TRAY) ×1 IMPLANT
TUBING CIL FLEX 10 FLL-RA (TUBING) ×1 IMPLANT

## 2021-12-19 NOTE — Interval H&P Note (Signed)
History and Physical Interval Note:  12/19/2021 10:38 AM  Desiree [name]  has presented today for surgery, with the diagnosis of bleeding - instage renal.  The various methods of treatment have been discussed with the patient and family. After consideration of risks, benefits and other options for treatment, the patient has consented to  Procedure(s): A/V Fistulagram (Left) as a surgical intervention.  The patient's history has been reviewed, patient examined, no change in status, stable for surgery.  I have reviewed the patient's chart and labs.  Questions were answered to the patient's satisfaction.     [name]

## 2021-12-19 NOTE — Progress Notes (Signed)
Heart Failure Navigator Progress Note  Assessed for Heart & Vascular TOC clinic readiness.  Patient does not meet criteria due to ESRD with a creat of 15.00.Marland Kitchen    Earnestine Leys, BSN, Clinical cytogeneticist Only

## 2021-12-19 NOTE — H&P (Addendum)
History and Physical    Desiree [name] BLT:[id number] DOB: Mar 22, 1975 DOA: 12/19/2021  PCP: Harvie Junior, MD (Confirm with patient/family/NH records and if not entered, this has to be entered at [facility] point of entry) Patient coming from: Home  I have personally briefly reviewed patient's old medical records in [place]  Chief Complaint: SOB  HPI: Desiree [name] is a 46 y.o. female with medical history significant of ESRD on HD, HTN, GERD, gout, anxiety/depression, migraines, presented with malfunction of left arm AV fistula and increasing shortness of breath.  Patient has ESRD on TTS HD schedule.  Her dialysis has been via a left arm AV fistula.  Starting from about 4 weeks ago, she has had access problem of the left arm AV fistula with frequent episode of clogging and readily left upper arm has developed significant swelling.  Tuesday, HD only went halfway and aborted due to " sudden swelling and 10/10 pain of the left arm" and patient sent home.  Patient over the last 2 days, started to feel increasing exertional dyspnea, and bilateral ankle swelling, denies any chest pain cough fever or chills.  Today she came to the hospital for elective left arm AV fistulagram by vascular surgery.   Review of Systems: As per HPI otherwise 14 point review of systems negative.    Past Medical History:  Diagnosis Date   Anemia    Anxiety    panic attacks   Arthritis    bilateral knees   Childhood asthma    Complication of anesthesia    "sometimes it does not work; didn't during LEEP OR" (01/21/2016)   Depression    no med   ESRD (end stage renal disease) on dialysis ([place])    "TTS; [facility]; [facility]" (08/03/2016)   GERD (gastroesophageal reflux disease)    nexium prn   Gout    History of blood transfusion    "related to kidneys; I've had 4" (01/21/2016)   Hypertension    Migraine    last one 01/18/19   Preterm labor ~ 2014   Retina hole, left    Seizures ([place])     "last one was in 2000; related to preeclampsia" (01/21/2016)    Past Surgical History:  Procedure Laterality Date   AV FISTULA PLACEMENT Left 2010   AV FISTULA PLACEMENT Left 01/20/2019   Procedure: ARTERIOVENOUS (AV) FISTULA CREATION LEFT UPPER ARM;  Surgeon: Angelia Mould, MD;  Location: [place];  Service: Vascular;  Laterality: Left;   CERVICAL BIOPSY  W/ LOOP ELECTRODE EXCISION  2001   DILATION AND EVACUATION  08/02/2011   Procedure: DILATATION AND EVACUATION;  Surgeon: Logan Bores, MD;  Location: [facility];  Service: Gynecology;;   DILATION AND EVACUATION N/A 08/31/2013   Procedure: DILATATION AND EVACUATION;  Surgeon: Woodroe Mode, MD;  Location: [facility];  Service: Gynecology;  Laterality: N/A;   FISTULA SUPERFICIALIZATION Left 01/20/2019   Procedure: Fistula Superficialization;  Surgeon: Angelia Mould, MD;  Location: [place];  Service: Vascular;  Laterality: Left;   HYDRADENITIS EXCISION Right    RENAL BIOPSY     REVISION OF ARTERIOVENOUS GORETEX GRAFT Left 02/11/2013   Procedure: REVISION OF ARTERIOVENOUS GORTEX FISTULA;  Surgeon: Rosetta Posner, MD;  Location: [facility];  Service: Vascular;  Laterality: Left;   THROMBECTOMY W/ EMBOLECTOMY Left 05/25/2018   Procedure: REPAIR OF BLEEDING ARTERIOVENOUS FISTULA;  Surgeon: Serafina Mitchell, MD;  Location: [place];  Service: Vascular;  Laterality: Left;  reports that she has never smoked. She has been exposed to tobacco smoke. She has never used smokeless tobacco. She reports current drug use. Drug: Marijuana. She reports that she does not drink alcohol.  Allergies  Allergen Reactions   Morphine Shortness Of Breath and Anaphylaxis   Prednisone Other (See Comments)    Other reaction(s): Other (See Comments) Muscle spasms Patient says prednisone causes her to cramp all over, muscle spasms uncontrolled   Tuna [Fish Allergy] Itching, Swelling, Rash and Other (See Comments)    Face droops also   Amlodipine Other (See Comments)     Angioedema (09/05/17 ED visit)   Iodinated Contrast Media Itching   Tape Itching    Adhesive tape   paper tape ok    Family History  Problem Relation Age of Onset   Diabetes Mother    Hyperlipidemia Mother    Hypertension Mother    Heart disease Mother    Hypertension Other    Diabetes type II Other      Prior to Admission medications   Medication Sig Start Date End Date Taking? Authorizing Provider  albuterol (PROVENTIL HFA;VENTOLIN HFA) 108 (90 Base) MCG/ACT inhaler Inhale 2 puffs into the lungs every 4 (four) hours as needed for wheezing or shortness of breath. 05/02/16  Yes Forde Dandy, MD  ALPRAZolam Duanne Moron) 1 MG tablet Take 1 mg by mouth 3 (three) times daily as needed. 11/25/21  Yes [provider]  aspirin EC 81 MG tablet Take 81 mg by mouth daily. Swallow whole.   Yes [provider]  calcium acetate (PHOSLO) 667 MG capsule Take 667-1,334 mg by mouth See admin instructions. 3 capsules with meals  1 to 2 capsules with snacks 11/30/17  Yes [provider]  cinacalcet (SENSIPAR) 60 MG tablet Take 60 mg by mouth every evening.   Yes [provider]  cyclobenzaprine (FLEXERIL) 10 MG tablet Take 10 mg by mouth 2 (two) times daily as needed for muscle spasms.   Yes [provider]  diphenhydrAMINE (BENADRYL) 25 MG tablet Take 1 tablet (25 mg total) by mouth every 6 (six) hours. Patient taking differently: Take 25 mg by mouth every 6 (six) hours as needed for itching or allergies. 09/05/17  Yes Domenic Moras, PA-C  lactulose (CHRONULAC) 10 GM/15ML solution Take 30 mLs by mouth daily as needed for mild constipation, moderate constipation or severe constipation. 09/04/21  Yes [provider]  nystatin-triamcinolone (MYCOLOG II) cream Apply topically 2 (two) times daily. 09/09/21  Yes [provider]  omeprazole (PRILOSEC) 20 MG capsule Take 20 mg by mouth daily as needed (acid reflux).  03/31/18  Yes [provider]   ondansetron (ZOFRAN-ODT) 8 MG disintegrating tablet Take 8 mg by mouth every 8 (eight) hours as needed for nausea or vomiting.    Yes [provider]  Oxycodone HCl 10 MG TABS Take 10 mg by mouth in the morning, at noon, and at bedtime. 05/17/19  Yes [provider]  sevelamer carbonate (RENVELA) 800 MG tablet Take 1,600 mg by mouth 3 (three) times daily. 05/18/19  Yes [provider]  AURYXIA 1 GM 210 MG(Fe) tablet Take 420 mg by mouth 3 (three) times daily. Patient not taking: Reported on 12/05/2021 01/02/21   [provider]  B Complex-C-Folic Acid (DIALYVITE 258) 0.8 MG WAFR Take 1 tablet by mouth at bedtime. Patient not taking: Reported on 12/05/2021 03/23/19   [provider]  doxycycline (VIBRA-TABS) 100 MG tablet Take 100 mg by mouth 2 (  two) times daily. 09/09/21   [provider]  NARCAN 4 MG/0.1ML LIQD nasal spray kit Place 1 spray into the nose as needed (accidental overdose). 11/14/18   [provider]  oxyCODONE-acetaminophen (PERCOCET) 10-325 MG tablet Take 1 tablet by mouth every 6 (six) hours as needed. Patient not taking: Reported on 12/05/2021 11/18/21   [provider]  pantoprazole (PROTONIX) 20 MG tablet Take 20 mg by mouth daily. 11/25/21   [provider]  SUMAtriptan (IMITREX) 100 MG tablet Take 100 mg by mouth as directed. 09/23/21   [provider]    Physical Exam: Vitals:   12/19/21 1113 12/19/21 1118 12/19/21 1130 12/19/21 1224  BP: (!) 158/91 (!) 158/91 (!) 160/95   Pulse: 88 80 88 67  Resp: 17  18   Temp:      TempSrc:      SpO2: 100%     Weight:      Height:        Constitutional: NAD, calm, comfortable Vitals:   12/19/21 1113 12/19/21 1118 12/19/21 1130 12/19/21 1224  BP: (!) 158/91 (!) 158/91 (!) 160/95   Pulse: 88 80 88 67  Resp: 17  18   Temp:      TempSrc:      SpO2: 100%     Weight:      Height:       Eyes: PERRL, lids and conjunctivae normal ENMT: Mucous  membranes are moist. Posterior pharynx clear of any exudate or lesions.Normal dentition.  Neck: normal, supple, no masses, no thyromegaly Respiratory: clear to auscultation bilaterally, no wheezing, scattered fine crackles on bilateral lung bases, increasing breathing effort. No accessory muscle use.  Cardiovascular: Regular rate and rhythm, no murmurs / rubs / gallops.  1+ bilateral extremity edema. 2+ pedal pulses. No carotid bruits.  Abdomen: no tenderness, no masses palpated. No hepatosplenomegaly. Bowel sounds positive.  Musculoskeletal: no clubbing / cyanosis. No joint deformity upper and lower extremities. Good ROM, no contractures. Normal muscle tone.  Skin: no rashes, lesions, ulcers. No induration Neurologic: CN 2-12 grossly intact. Sensation intact, DTR normal. Strength 5/5 in all 4.  Psychiatric: Normal judgment and insight. Alert and oriented x 3. Normal mood.     Labs on Admission: I have personally reviewed following labs and imaging studies  CBC: Recent Labs  Lab 12/19/21 0843  HGB 7.8*  HCT 96.2*   Basic Metabolic Panel: Recent Labs  Lab 12/19/21 0843  NA 138  K 4.0  CL 97*  GLUCOSE 80  BUN 41*  CREATININE 15.00*   GFR: Estimated Creatinine Clearance: 5.2 mL/min (A) (by C-G formula based on SCr of 15 mg/dL (H)). Liver Function Tests: No results for input(s): "AST", "ALT", "ALKPHOS", "BILITOT", "PROT", "ALBUMIN" in the last 168 hours. No results for input(s): "LIPASE", "AMYLASE" in the last 168 hours. No results for input(s): "AMMONIA" in the last 168 hours. Coagulation Profile: No results for input(s): "INR", "PROTIME" in the last 168 hours. Cardiac Enzymes: No results for input(s): "CKTOTAL", "CKMB", "CKMBINDEX", "TROPONINI" in the last 168 hours. BNP (last 3 results) No results for input(s): "PROBNP" in the last 8760 hours. HbA1C: No results for input(s): "HGBA1C" in the last 72 hours. CBG: No results for input(s): "GLUCAP" in the last 168  hours. Lipid Profile: No results for input(s): "CHOL", "HDL", "LDLCALC", "TRIG", "CHOLHDL", "LDLDIRECT" in the last 72 hours. Thyroid Function Tests: No results for input(s): "TSH", "T4TOTAL", "FREET4", "T3FREE", "THYROIDAB" in the last 72 hours. Anemia Panel: No results for input(s): "VITAMINB12", "  FOLATE", "FERRITIN", "TIBC", "IRON", "RETICCTPCT" in the last 72 hours. Urine analysis:    Component Value Date/Time   COLORURINE YELLOW 12/20/2013 0530   APPEARANCEUR CLOUDY (A) 12/20/2013 0530   LABSPEC 1.009 12/20/2013 0530   PHURINE 8.0 12/20/2013 0530   GLUCOSEU 100 (A) 12/20/2013 0530   HGBUR LARGE (A) 12/20/2013 0530   BILIRUBINUR NEGATIVE 12/20/2013 0530   KETONESUR NEGATIVE 12/20/2013 0530   PROTEINUR 100 (A) 12/20/2013 0530   UROBILINOGEN 0.2 12/20/2013 0530   NITRITE NEGATIVE 12/20/2013 0530   LEUKOCYTESUR TRACE (A) 12/20/2013 0530    Radiological Exams on Admission: DG Chest 1 View  Result Date: 12/19/2021 CLINICAL DATA:  Shortness of breath EXAM: CHEST  1 VIEW COMPARISON:  Previous studies including the examination of 12/04/2021 FINDINGS: Transverse diameter of heart is increased. Central pulmonary vessels are prominent suggesting CHF. There is subtle increase in interstitial markings in parahilar regions and lower lung fields. There is no focal consolidation. There is minimal blunting of left lateral CP angle. There is no pneumothorax. IMPRESSION: Cardiomegaly. Central pulmonary vessels are prominent suggesting CHF. Prominence of interstitial markings in parahilar regions and lower lung fields suggest interstitial pulmonary edema. Small left pleural effusion. Electronically Signed   By: Elmer Picker M.D.   On: 12/19/2021 13:32   PERIPHERAL VASCULAR CATHETERIZATION  Result Date: 12/19/2021 Table formatting from the original result was not included. INDICATIONS:  Desiree [name] is a 46 y.o. female who was seen in consultation in the hospital with prolonged bleeding from  her left upper arm fistula.  This was a brachiocephalic fistula that was placed in November 2020.  Dr. Stanford Breed recommended a fistulogram but apparently she signed out [place].  She was set up for a fistulogram.  On my history, she said she had an infiltrate in the proximal fistula.  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  There is also an area of swelling over the fistula which looks to be at risk for bleeding.  The fistula is slightly pulsatile. PROCEDURE:  Ultrasound-guided access to left brachiocephalic fistula Fistulogram left brachiocephalic fistula SURGEON: Judeth Cornfield. Scot Dock, MD, FACS ANESTHESIA: Local EBL: Min mL TECHNIQUE: The patient was brought to the [facility] lab.  The left arm was prepped and draped in usual sterile fashion.  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  A micropuncture sheath was introduced over the wire.  Fistulogram was then obtained to evaluate the fistula from the point of cannulation to include the central veins.  I did not compress the fistula to do a reflux shot because of the wound that was at risk for bleeding.  At the completion of 4-0 Monocryl was placed across the cannulation site.  There was good hemostasis. FINDINGS: There is no central venous stenosis. The upper arm fistula is widely patent although it is markedly tortuous and thus would be very difficult to cannulate. CLINICAL NOTE: The she will be transferred to the operating room for exploration of the fistula, possible ligation of the fistula or revision, and placement of a tunneled dialysis catheter.  I have reviewed the indications for the procedure with the patient and the potential complications and she is agreeable to proceed. [name], MD, FACS [facility]    EKG:  Ordered  Assessment/Plan Principal Problem:   CHF (congestive heart  failure) ([place]) Active Problems:   ESRD (end stage renal disease) on dialysis ([place])  (please populate well all problems here in Problem List. (For example, if patient is on BP meds at home and you resume or decide to hold them, it is a problem that needs to be her. Same for CAD, COPD, HLD and so on)  Acute HFpEF -Fluid overload, secondary to missed dialysis from left arm AV fistula malfunction.  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 -Discussed with on-call nephrology, Dr. Jonnie Finner, who will arrange HD -Blood pressure uncontrolled, ordered as needed hydralazine injection, 1 dose of 80 mg IV Lasix also ordered.  ESRD on HD with acute left arm AV fistula malfunction -With worsening of azotemia and fluid overload -As above.  HTN, uncontrolled -As needed hydralazine -Resume home BP meds  Chronic normocytic anemia -H&H stable, no symptoms or signs of active bleed, outpatient PCP follow-up for iron study and probably need periodic EPO  Migraines -Stable, continue as needed Imitrex  Anxiety/depression -Mentation at baseline  DVT prophylaxis: Heparin subcu Code Status: Full code Family Communication: None at bedside Disposition Plan: Expect less than 2 midnight hospital stay Consults called: Vascular surgery, nephrology Admission status: Telemetry observation   Lequita Halt MD [facility] Pager [phone]  12/19/2021, 1:38 PM

## 2021-12-19 NOTE — Consult Note (Signed)
Renal Service Consult Note [facility]  Desiree [name] 12/19/2021 Sol Blazing, MD Requesting Physician: Dr. Roosevelt Locks  Reason for Consult: ESRD pt w/ access problem HPI: The patient is a 46 y.o. year-old w/ hx of anemia, anxiety, HTN, ESRD on HD, gout who presents w/ bleeding from her AVF. Had fistulogram by VVS here and then went to OR for resection of infected buttonhole tissue and placement of TDC. Post op and preop CXR's showing pulm edema. Pt sob. Asked to see for dialysis.   Pt seen in PACU, no CP or abd pain. Drowsy post surgery.   ROS - denies CP, no joint pain, no HA, no blurry vision, no rash, no diarrhea, no nausea/ vomiting   Past Medical History  Past Medical History:  Diagnosis Date   Anemia    Anxiety    panic attacks   Arthritis    bilateral knees   Childhood asthma    Complication of anesthesia    "sometimes it does not work; didn't during LEEP OR" (01/21/2016)   Depression    no med   ESRD (end stage renal disease) on dialysis ([place])    "TTS; [facility]" (08/03/2016)   GERD (gastroesophageal reflux disease)    nexium prn   Gout    History of blood transfusion    "related to kidneys; I've had 4" (01/21/2016)   Hypertension    Migraine    last one 01/18/19   Preterm labor ~ 2014   Retina hole, left    Seizures ([place])    "last one was in 2000; related to preeclampsia" (01/21/2016)   Past Surgical History  Past Surgical History:  Procedure Laterality Date   A/V FISTULAGRAM Left 12/19/2021   Procedure: A/V Fistulagram;  Surgeon: Angelia Mould, MD;  Location: [place] [facility];  Service: Cardiovascular;  Laterality: Left;   AV FISTULA PLACEMENT Left 2010   AV FISTULA PLACEMENT Left 01/20/2019   Procedure: ARTERIOVENOUS (AV) FISTULA CREATION LEFT UPPER ARM;  Surgeon: Angelia Mould, MD;  Location: [place];  Service: Vascular;  Laterality: Left;   CERVICAL BIOPSY  W/ LOOP ELECTRODE EXCISION  2001   DILATION AND  EVACUATION  08/02/2011   Procedure: DILATATION AND EVACUATION;  Surgeon: Logan Bores, MD;  Location: [facility];  Service: Gynecology;;   DILATION AND EVACUATION N/A 08/31/2013   Procedure: DILATATION AND EVACUATION;  Surgeon: Woodroe Mode, MD;  Location: [facility] [facility];  Service: Gynecology;  Laterality: N/A;   FISTULA SUPERFICIALIZATION Left 01/20/2019   Procedure: Fistula Superficialization;  Surgeon: Angelia Mould, MD;  Location: [place];  Service: Vascular;  Laterality: Left;   HYDRADENITIS EXCISION Right    RENAL BIOPSY     REVISION OF ARTERIOVENOUS GORETEX GRAFT Left 02/11/2013   Procedure: REVISION OF ARTERIOVENOUS GORTEX FISTULA;  Surgeon: Rosetta Posner, MD;  Location: [facility];  Service: Vascular;  Laterality: Left;   THROMBECTOMY W/ EMBOLECTOMY Left 05/25/2018   Procedure: REPAIR OF BLEEDING ARTERIOVENOUS FISTULA;  Surgeon: Serafina Mitchell, MD;  [facility];  Service: Vascular;  Laterality: Left;   Family History  Family History  Problem Relation Age of Onset   Diabetes Mother    Hyperlipidemia Mother    Hypertension Mother    Heart disease Mother    Hypertension Other    Diabetes type II Other    Social History  reports that she has never smoked. She has been exposed to tobacco smoke. She has never used smokeless tobacco. She  reports current drug use. Drug: Marijuana. She reports that she does not drink alcohol. Allergies  Allergies  Allergen Reactions   Morphine Shortness Of Breath and Anaphylaxis   Prednisone Other (See Comments)    Other reaction(s): Other (See Comments) Muscle spasms Patient says prednisone causes her to cramp all over, muscle spasms uncontrolled   Tuna [Fish Allergy] Itching, Swelling, Rash and Other (See Comments)    Face droops also   Amlodipine Other (See Comments)    Angioedema (09/05/17 ED visit)   Iodinated Contrast Media Itching   Tape Itching    Adhesive tape   paper tape ok   Home medications Prior to Admission medications    Medication Sig Start Date End Date Taking? Authorizing Provider  albuterol (PROVENTIL HFA;VENTOLIN HFA) 108 (90 Base) MCG/ACT inhaler Inhale 2 puffs into the lungs every 4 (four) hours as needed for wheezing or shortness of breath. 05/02/16  Yes Forde Dandy, MD  ALPRAZolam Duanne Moron) 1 MG tablet Take 1 mg by mouth 3 (three) times daily as needed. 11/25/21  Yes [provider]  aspirin EC 81 MG tablet Take 81 mg by mouth daily. Swallow whole.   Yes [provider]  calcium acetate (PHOSLO) 667 MG capsule Take 667-1,334 mg by mouth See admin instructions. 3 capsules with meals  1 to 2 capsules with snacks 11/30/17  Yes [provider]  cinacalcet (SENSIPAR) 60 MG tablet Take 60 mg by mouth every evening.   Yes [provider]  cyclobenzaprine (FLEXERIL) 10 MG tablet Take 10 mg by mouth 2 (two) times daily as needed for muscle spasms.   Yes [provider]  diphenhydrAMINE (BENADRYL) 25 MG tablet Take 1 tablet (25 mg total) by mouth every 6 (six) hours. Patient taking differently: Take 25 mg by mouth every 6 (six) hours as needed for itching or allergies. 09/05/17  Yes Domenic Moras, PA-C  lactulose (CHRONULAC) 10 GM/15ML solution Take 30 mLs by mouth daily as needed for mild constipation, moderate constipation or severe constipation. 09/04/21  Yes [provider]  nystatin-triamcinolone (MYCOLOG II) cream Apply topically 2 (two) times daily. 09/09/21  Yes [provider]  omeprazole (PRILOSEC) 20 MG capsule Take 20 mg by mouth daily as needed (acid reflux).  03/31/18  Yes [provider]  ondansetron (ZOFRAN-ODT) 8 MG disintegrating tablet Take 8 mg by mouth every 8 (eight) hours as needed for nausea or vomiting.    Yes [provider]  Oxycodone HCl 10 MG TABS Take 10 mg by mouth in the morning, at noon, and at bedtime. 05/17/19  Yes [provider]  sevelamer carbonate (RENVELA) 800 MG tablet Take 1,600 mg by mouth 3  (three) times daily. 05/18/19  Yes [provider]  AURYXIA 1 GM 210 MG(Fe) tablet Take 420 mg by mouth 3 (three) times daily. Patient not taking: Reported on 12/05/2021 01/02/21   [provider]  B Complex-C-Folic Acid (DIALYVITE 169) 0.8 MG WAFR Take 1 tablet by mouth at bedtime. Patient not taking: Reported on 12/05/2021 03/23/19   [provider]  doxycycline (VIBRA-TABS) 100 MG tablet Take 100 mg by mouth 2 (two) times daily. 09/09/21   [provider]  NARCAN 4 MG/0.1ML LIQD nasal spray kit Place 1 spray into the nose as needed (accidental overdose). 11/14/18   [provider]  oxyCODONE-acetaminophen (PERCOCET) 10-325 MG tablet Take 1 tablet by mouth every 6 (six) hours as needed. Patient not taking: Reported on 12/05/2021 11/18/21   [provider]  pantoprazole (PROTONIX) 20 MG tablet Take 20 mg by mouth daily. 11/25/21   [provider]  SUMAtriptan (IMITREX) 100 MG tablet Take 100 mg by mouth as directed. 09/23/21   [provider]     Vitals:   12/19/21 1520 12/19/21 1530 12/19/21 1545 12/19/21 1613  BP: (!) 175/96 (!) 164/84 (!) 167/79 (!) 179/91  Pulse: 90 84 79 77  Resp: 19 20 (!) 25   Temp: 98.4 F (36.9 C)  97.8 F (36.6 C) 97.9 F (36.6 C)  TempSrc:    Oral  SpO2: 100% 100% 95% 90%  Weight:      Height:       Exam Gen groggy, no distress, in pain No rash, cyanosis or gangrene Sclera anicteric, throat clear  No jvd or bruits Chest clear bilat to bases, no rales RRR no MRG Abd soft ntnd no mass or ascites +bs GU defer MS no joint effusions or deformity Ext 1+ LE edema, no wounds or ulcers Neuro is alert, Ox 3 , nf    RIJ TDC (new), L AVF revised/ wrapped   Home meds include - xanax, aspirin, auryxia 2 ac, phoslo 1-2 w/ snacks, sensipar 60 hs, lactulose, prilosec, oxycodone IR, protonix, sevelamer carb 2 ac tid, prns/ vits/ supps     OP HD: TTS SW 3h 47mn   400/600   88kg  2/2 bath   P4   Hep  6600  new TDC (AVF revised, don't use for now) - last HD 10/5, post wt 90kg   - last Hb 7.6 - mircera 225 q2, last 9/26 - hectorol 10 ug tiw IV tts    BP 180/ 80   RA 100% ,now on 4 L [place]    CXR 1 - vasc congestion and early edema    CXR 2 -  worsening edema  Assessment/ Plan: 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  SOB/ pulm edema - by CXR. Will need extra HD tonight.  ESRD - on HD TTS. Needs HD tonight as above. K+ ok.  Anemia esrd - Hb 7.8 here, last esa on 9/26, due next week Bmd ckd - add on Ca/ phos.  BP/vol - not on BP lowering meds at home. BP's high. Re-eval in am after UF w/ hd tonight.       RKelly Splinter MD 12/19/2021, 4:21 PM Recent Labs  Lab 12/19/21 0843  HGB 7.8*  CREATININE 15.00*  K 4.0   Inpatient medications:  furosemide  80 mg Intravenous Once    ceFAZolin     ceFAZolin, hydrALAZINE

## 2021-12-19 NOTE — Op Note (Signed)
   PATIENT: Desiree [name]      MRN: [id number] DOB: [birth year]    DATE OF PROCEDURE: 12/19/2021  INDICATIONS:    Desiree [name] is a 46 y.o. female who was seen in consultation in the hospital with prolonged bleeding from her left upper arm fistula.  This was a brachiocephalic fistula that was placed in November 2020.  Dr. Stanford Breed recommended a fistulogram but apparently she signed out [name].  She was set up for a fistulogram.  On my history, she said she had an infiltrate in the proximal fistula.  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  There is also an area of swelling over the fistula which looks to be at risk for bleeding.  The fistula is slightly pulsatile.  PROCEDURE:    Ultrasound-guided access to left brachiocephalic fistula Fistulogram left brachiocephalic fistula  SURGEON: Judeth Cornfield. Scot Dock, MD, FACS  ANESTHESIA: Local  EBL: Min mL  TECHNIQUE: The patient was brought to the [facility] lab.  The left arm was prepped and draped in usual sterile fashion.  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  A micropuncture sheath was introduced over the wire.  Fistulogram was then obtained to evaluate the fistula from the point of cannulation to include the central veins.  I did not compress the fistula to do a reflux shot because of the wound that was at risk for bleeding.  At the completion of 4-0 Monocryl was placed across the cannulation site.  There was good hemostasis.  FINDINGS:   There is no central venous stenosis. The upper arm fistula is widely patent although it is markedly tortuous and thus would be very difficult to cannulate.  CLINICAL NOTE: The she will be transferred to the operating room for exploration of the fistula, possible ligation of the fistula or revision, and placement  of a tunneled dialysis catheter.  I have reviewed the indications for the procedure with the patient and the potential complications and she is agreeable to proceed.  [name], MD, FACS [facility]  DATE OF DICTATION:   12/19/2021

## 2021-12-19 NOTE — Transfer of Care (Signed)
Immediate Anesthesia Transfer of Care Note  Patient: Desiree [name]  Procedure(s) Performed: ARTERIOVENOUS (AV) FISTULA CREATION POSSIBLE TUNNELED DIALYSIS (Left) INSERTION OF A TUNNELED DIALYSIS CATHETER (Right: Neck)  Patient Location: PACU  Anesthesia Type:General  Level of Consciousness: awake and alert   Airway & Oxygen Therapy: Patient Spontanous Breathing and Patient connected to face mask oxygen  Post-op Assessment: Report given to RN and Post -op Vital signs reviewed and stable  Post vital signs: Reviewed and stable  Last Vitals:  Vitals Value Taken Time  BP 175/96 12/19/21 1517  Temp 36   Pulse 91 12/19/21 1518  Resp 30 12/19/21 1519  SpO2 99 % 12/19/21 1518  Vitals shown include unvalidated device data.  Last Pain:  Vitals:   12/19/21 1123  TempSrc:   PainSc: 0-No pain         Complications: No notable events documented.

## 2021-12-19 NOTE — Progress Notes (Signed)
Pharmacy Antibiotic Note  Desiree [name] is a 46 y.o. female admitted on 12/19/2021 with  possible infection of L arm fistula .  Pharmacy has been consulted for Vancomycin and Zosyn dosing. S/p OR 10/6 for [facility] placement in SVC.   Noted pt is ESRD - usual T/T/S  Plan: Zosyn 2.25gm IV q8h Vancomycin 2000mg  IV now then 1000mg  IV qHD Will f/u HD schedule/tolerance, micro data, and pt's clinical condition Vanc levels prn   Height: 5\' 5"  (165.1 cm) Weight: 91.8 kg (202 lb 6.1 oz) IBW/kg (Calculated) : 57  Temp (24hrs), Avg:98.4 F (36.9 C), Min:97.8 F (36.6 C), Max:99.5 F (37.5 C)  Recent Labs  Lab 12/19/21 0843  CREATININE 15.00*    Estimated Creatinine Clearance: 5.3 mL/min (A) (by C-G formula based on SCr of 15 mg/dL (H)).    Allergies  Allergen Reactions   Morphine Shortness Of Breath and Anaphylaxis   Prednisone Other (See Comments)    Other reaction(s): Other (See Comments) Muscle spasms Patient says prednisone causes her to cramp all over, muscle spasms uncontrolled   Tuna [Fish Allergy] Itching, Swelling, Rash and Other (See Comments)    Face droops also   Amlodipine Other (See Comments)    Angioedema (09/05/17 ED visit)   Iodinated Contrast Media Itching   Tape Itching    Adhesive tape   paper tape ok    Antimicrobials this admission: 10/6 Vanc >>  10/6 Zosyn >>   Microbiology results: 10/6 wound culture:  Thank you for allowing pharmacy to be a part of this patient's care.  Sherlon Handing, PharmD, BCPS Please see amion for complete clinical pharmacist phone list 12/19/2021 4:27 PM

## 2021-12-19 NOTE — Progress Notes (Signed)
S/p repair of buttonhole of fistula.  Possible infection of left arm fistula.    Vanc and zosyn per pharmacy.  Cultures obtained in OR.   Leontine Locket, [facility] 12/19/2021 3:11 PM

## 2021-12-19 NOTE — Op Note (Signed)
    Patient name: Desiree [name] MRN: [id number] DOB: Nov 23, 1975 Sex: female  12/19/2021 Pre-operative Diagnosis: esrd, infected left arm fistula Post-operative diagnosis:  Same Surgeon:  Eda Paschal. Donzetta Matters, MD Assistant: Leontine Locket, PA; Laddie Aquas, MS3 Procedure Performed: 1.  Right IJ 19 cm tunneled dialysis catheter placement with ultrasound and fluoroscopic guidance 2.  Excision of infected skin overlying AV fistula and repair of AV fistula buttonhole   Indications: 46 year old female with history of end-stage renal disease on dialysis via a buttonhole in the left arm AV fistula.  She now has purulence in the area of cannulation has undergone fistulogram which does not demonstrate any stenosis requiring repair and she is indicated for catheter placement as well as drainage of purulent area with excision of overlying skin.  Findings: Tunneled dialysis catheter was placed to the SVC atrial junction without complication.  The left arm there was a purulent pocket overlying the buttonhole this was excised and the fistula was primarily repaired and the skin was closed overlying the fistula with staples.   Procedure:  The patient was identified in the holding area and taken to the operating room where LMA anesthesia was induced.  She was sterilely prepped and draped in the neck and chest in both sides as well as the left upper extremity usual fashion, antibiotics were ministered a timeout was called.  Ultrasound was used to identify the right IJ which was noted be patent and compressible and was cannulated with an 18-gauge needle followed by wire.  A 19 cm catheter was tunneled from a counterincision in the cannulation incision was extended with 11 blade.  We then serially dilated the wire tract placed in introducer sheath under fluoroscopic guidance and then placed the catheter to the SVC atrial junction.  This flushed and withdrew saline easily and was affixed to the skin with 3-0 nylon suture.   The neck incision was closed with 4-0 Monocryl and Dermabond was placed at both sites.  Sterile dressing was applied to the catheter.  Catheter was locked with concentrated heparin to manufacturer's recommendations.  Attention was then turned to the left arm.  Ultrasound identified what appeared to be a pus pocket overlying the fistula that was communicating to the buttonhole.  I then made an incision above and below this and dissected down to get control of the fistula and also able to put a clamp on it.  We then opened the pus pocket and there was bleeding there this was repaired with 5-0 Prolene suture we closed the buttonhole.  I then mobilized the fistula using cautery.  I excised the infected skin and sent aerobic and anaerobic cultures.  I then plicated the fistula with 4-0 Prolene suture.  The wound was thoroughly irrigated hemostasis obtained I then closed some soft tissue over the fistula with 2-0 Vicryl suture and then the skin was closed with staples.  An Ace bandage was applied.  She was awakened from anesthesia having tolerated procedure without any complication.  All counts were correct at completion.  Assistants were necessary to facilitate retraction as well as to help obtain hemostasis and follow the Prolene suture.  This could not have been completed by a scrub tech.  EBL: 100cc   Lucca Greggs C. Donzetta Matters, MD [facility] [place] Office: [phone] Pager: [phone]

## 2021-12-19 NOTE — Anesthesia Preprocedure Evaluation (Signed)
Anesthesia Evaluation  Patient identified by MRN, date of birth, ID band Patient awake    Reviewed: Allergy & Precautions, H&P , NPO status , Patient's Chart, lab work & pertinent test results  Airway Mallampati: I  TM Distance: >3 FB Neck ROM: Full    Dental no notable dental hx. (+) Teeth Intact, Dental Advisory Given   Pulmonary neg pulmonary ROS   Pulmonary exam normal breath sounds clear to auscultation       Cardiovascular hypertension, negative cardio ROS  Rhythm:Regular Rate:Normal     Neuro/Psych  Headaches  Anxiety Depression       GI/Hepatic Neg liver ROS,GERD  Medicated,,  Endo/Other  negative endocrine ROS    Renal/GU ESRF and DialysisRenal disease  negative genitourinary   Musculoskeletal   Abdominal   Peds  Hematology  (+) Blood dyscrasia, anemia   Anesthesia Other Findings   Reproductive/Obstetrics negative OB ROS                             Anesthesia Physical Anesthesia Plan  ASA: 3  Anesthesia Plan: General   Post-op Pain Management: Tylenol PO (pre-op)*   Induction: Intravenous  PONV Risk Score and Plan: 4 or greater and Ondansetron, Dexamethasone and Midazolam  Airway Management Planned: Oral ETT  Additional Equipment:   Intra-op Plan:   Post-operative Plan: Extubation in OR  Informed Consent: I have reviewed the patients History and Physical, chart, labs and discussed the procedure including the risks, benefits and alternatives for the proposed anesthesia with the patient or authorized representative who has indicated his/her understanding and acceptance.     Dental advisory given  Plan Discussed with: CRNA  Anesthesia Plan Comments:        Anesthesia Quick Evaluation

## 2021-12-19 NOTE — Anesthesia Postprocedure Evaluation (Signed)
Anesthesia Post Note  Patient: Blondina N Cammarata  Procedure(s) Performed: ARTERIOVENOUS (AV) FISTULA CREATION POSSIBLE TUNNELED DIALYSIS (Left) INSERTION OF A TUNNELED DIALYSIS CATHETER (Right: Neck)     Patient location during evaluation: PACU Anesthesia Type: General Level of consciousness: awake and alert Pain management: pain level controlled Vital Signs Assessment: post-procedure vital signs reviewed and stable Respiratory status: spontaneous breathing, nonlabored ventilation and respiratory function stable Cardiovascular status: blood pressure returned to baseline and stable Postop Assessment: no apparent nausea or vomiting Anesthetic complications: no   No notable events documented.  Last Vitals:  Vitals:   12/19/21 1530 12/19/21 1545  BP: (!) 164/84 (!) 167/79  Pulse: 84 79  Resp: 20 (!) 25  Temp:  36.6 C  SpO2: 100% 95%    Last Pain:  Vitals:   12/19/21 1545  TempSrc:   PainSc: Asleep                 Demetruis Depaul,W. EDMOND

## 2021-12-19 NOTE — Anesthesia Procedure Notes (Signed)
Procedure Name: Intubation Date/Time: 12/19/2021 2:01 PM  Performed by: Minerva Ends, CRNAPre-anesthesia Checklist: Patient identified, Emergency Drugs available, Suction available and Patient being monitored Patient Re-evaluated:Patient Re-evaluated prior to induction Oxygen Delivery Method: Circle system utilized Preoxygenation: Pre-oxygenation with 100% oxygen Induction Type: IV induction Ventilation: Oral airway inserted - appropriate to patient size and Mask ventilation with difficulty Laryngoscope Size: Mac and 3 Grade View: Grade I Tube type: Oral Tube size: 7.0 mm Number of attempts: 1 Airway Equipment and Method: Stylet and Oral airway Placement Confirmation: ETT inserted through vocal cords under direct vision, positive ETCO2 and breath sounds checked- equal and bilateral Secured at: 21 cm Tube secured with: Tape Dental Injury: Teeth and Oropharynx as per pre-operative assessment

## 2021-12-19 NOTE — Plan of Care (Signed)
  Problem: Clinical Measurements: Goal: Ability to maintain clinical measurements within normal limits will improve Outcome: Progressing   Problem: Clinical Measurements: Goal: Will remain free from infection Outcome: Progressing   Problem: Clinical Measurements: Goal: Diagnostic test results will improve Outcome: Progressing   

## 2021-12-20 ENCOUNTER — Encounter (HOSPITAL_COMMUNITY): Payer: Self-pay | Admitting: Vascular Surgery

## 2021-12-20 DIAGNOSIS — N186 End stage renal disease: Secondary | ICD-10-CM

## 2021-12-20 DIAGNOSIS — Z992 Dependence on renal dialysis: Secondary | ICD-10-CM

## 2021-12-20 DIAGNOSIS — K219 Gastro-esophageal reflux disease without esophagitis: Secondary | ICD-10-CM

## 2021-12-20 DIAGNOSIS — L089 Local infection of the skin and subcutaneous tissue, unspecified: Secondary | ICD-10-CM | POA: Diagnosis present

## 2021-12-20 DIAGNOSIS — E669 Obesity, unspecified: Secondary | ICD-10-CM | POA: Diagnosis present

## 2021-12-20 DIAGNOSIS — I160 Hypertensive urgency: Secondary | ICD-10-CM | POA: Diagnosis not present

## 2021-12-20 LAB — BASIC METABOLIC PANEL
Anion gap: 18 — ABNORMAL HIGH (ref 5–15)
BUN: 53 mg/dL — ABNORMAL HIGH (ref 6–20)
CO2: 22 mmol/L (ref 22–32)
Calcium: 9.3 mg/dL (ref 8.9–10.3)
Chloride: 97 mmol/L — ABNORMAL LOW (ref 98–111)
Creatinine, Ser: 15.22 mg/dL — ABNORMAL HIGH (ref 0.44–1.00)
GFR, Estimated: 3 mL/min — ABNORMAL LOW (ref >60)
Glucose, Bld: 119 mg/dL — ABNORMAL HIGH (ref 70–99)
Potassium: 5.7 mmol/L — ABNORMAL HIGH (ref 3.5–5.1)
Sodium: 137 mmol/L (ref 135–145)

## 2021-12-20 LAB — CBC
HCT: 22.2 % — ABNORMAL LOW (ref 36.0–46.0)
Hemoglobin: 6.9 g/dL — CL (ref 12.0–15.0)
MCH: 26.8 pg (ref 26.0–34.0)
MCHC: 31.1 g/dL (ref 30.0–36.0)
MCV: 86.4 fL (ref 80.0–100.0)
Platelets: 258 10*3/uL (ref 150–400)
RBC: 2.57 MIL/uL — ABNORMAL LOW (ref 3.87–5.11)
RDW: 16.2 % — ABNORMAL HIGH (ref 11.5–15.5)
WBC: 8.1 10*3/uL (ref 4.0–10.5)
nRBC: 0 % (ref 0.0–0.2)

## 2021-12-20 LAB — PREPARE RBC (CROSSMATCH)

## 2021-12-20 MED ORDER — SODIUM CHLORIDE 0.9% IV SOLUTION: Freq: Once | INTRAVENOUS | Status: DC

## 2021-12-20 MED ORDER — HEPARIN SODIUM (PORCINE) 1000 UNIT/ML DIALYSIS
2000.0000 [IU] | INTRAMUSCULAR | Status: DC | PRN
  Administered 2021-12-20: 2000 [IU] via INTRAVENOUS_CENTRAL
  Filled 2021-12-20 (×3): qty 2

## 2021-12-20 MED ORDER — HYDROMORPHONE HCL 1 MG/ML IJ SOLN
0.5000 mg | Freq: Once | INTRAMUSCULAR | Status: AC | PRN
  Administered 2021-12-20: 0.5 mg via INTRAVENOUS
  Filled 2021-12-20: qty 0.5

## 2021-12-20 MED ORDER — HEPARIN SODIUM (PORCINE) 1000 UNIT/ML IJ SOLN
INTRAMUSCULAR | Status: AC
  Filled 2021-12-20: qty 4

## 2021-12-20 NOTE — Assessment & Plan Note (Signed)
Calculated BMI is 32.9

## 2021-12-20 NOTE — Progress Notes (Signed)
Patient seen and examined this morning during dialysis. Pain well controlled  Normal hand function, no paresthesias, no motor deficits Minimal edema Palpable brachial pulse distal to the fistula   Patient okay for discharge from a vascular surgery perspective, I will set her for follow-up in 2 weeks in our office for wound check.  Wound care in the outpatient setting should be dry dressing with light wrap daily.  Broadus John MD

## 2021-12-20 NOTE — Progress Notes (Signed)
[facility] Progress Note   Subjective:   Patient seen and examined at bedside in dialysis. Tolerating treatment well so far.  Pain mostly well controlled.  Continues to have SOB. Admits she has been getting under edw and likely has lost weight.  Denies CP and n/v/d.  Hgb drop 6.9 this morning.   Objective Vitals:   12/20/21 1000 12/20/21 1015 12/20/21 1030 12/20/21 1100  BP: 138/83  (!) 156/82 (!) 147/73  Pulse: 79 86 91 74  Resp: 10  (!) 23 (!) 25  Temp: 98.1 F (36.7 C) 98.1 F (36.7 C) 97.6 F (36.4 C)   TempSrc: Oral Oral    SpO2:  100% 100% 100%  Weight:      Height:       Physical Exam General:chronically ill appearing female in NAD Heart:RRR, no mrg Lungs:CTAB anterolaterally, nml WOB on 2L O2 Abdomen:soft, NTND Extremities:trace LE edema Dialysis Access: TDC, LU AVF +b/t w/ace wrap   Filed Weights   12/19/21 1613 12/20/21 0253 12/20/21 0818  Weight: 91.8 kg 92.4 kg 89.9 kg    Intake/Output Summary (Last 24 hours) at 12/20/2021 1119 Last data filed at 12/20/2021 1030 Gross per 24 hour  Intake 1555 ml  Output 20 ml  Net 1535 ml    Additional Objective Labs: Basic Metabolic Panel: Recent Labs  Lab 12/19/21 0843 12/20/21 0125  NA 138 137  K 4.0 5.7*  CL 97* 97*  CO2  --  22  GLUCOSE 80 119*  BUN 41* 53*  CREATININE 15.00* 15.22*  CALCIUM  --  9.3   CBC: Recent Labs  Lab 12/19/21 0843 12/20/21 0743  WBC  --  8.1  HGB 7.8* 6.9*  HCT 23.0* 22.2*  MCV  --  86.4  PLT  --  258   Studies/Results: DG Chest Port 1 View  Result Date: 12/19/2021 CLINICAL DATA:  Status post dialysis catheter insertion. EXAM: PORTABLE CHEST 1 VIEW COMPARISON:  Chest x-ray December 19, 2021 FINDINGS: Interval placement of right central venous catheter, terminating in the right atrium. Unchanged cardiomegaly.  Stable mediastinal contours. Increased diffuse hazy pulmonary opacities. Left-greater-than-right pleural effusions. The left costophrenic angle is  excluded from the field of view. No large pneumothorax. No acute osseous abnormality. The visualized upper abdomen is unremarkable. IMPRESSION: 1. Appropriate positioning of the right central venous catheter. 2. Increased moderate pulmonary edema and left-greater-than-right pleural effusions. Electronically Signed   By: Beryle Flock M.D.   On: 12/19/2021 16:18   DG C-Arm 1-60 Min-No Report  Result Date: 12/19/2021 Fluoroscopy was utilized by the requesting physician.  No radiographic interpretation.   DG Chest 1 View  Result Date: 12/19/2021 CLINICAL DATA:  Shortness of breath EXAM: CHEST  1 VIEW COMPARISON:  Previous studies including the examination of 12/04/2021 FINDINGS: Transverse diameter of heart is increased. Central pulmonary vessels are prominent suggesting CHF. There is subtle increase in interstitial markings in parahilar regions and lower lung fields. There is no focal consolidation. There is minimal blunting of left lateral CP angle. There is no pneumothorax. IMPRESSION: Cardiomegaly. Central pulmonary vessels are prominent suggesting CHF. Prominence of interstitial markings in parahilar regions and lower lung fields suggest interstitial pulmonary edema. Small left pleural effusion. Electronically Signed   By: Elmer Picker M.D.   On: 12/19/2021 13:32   PERIPHERAL VASCULAR CATHETERIZATION  Result Date: 12/19/2021 Table formatting from the original result was not included. INDICATIONS:  TEONA VARGUS is a 46 y.o. female who was seen in consultation in the hospital  with prolonged bleeding from her left upper arm fistula.  This was a brachiocephalic fistula that was placed in November 2020.  Dr. Stanford Breed recommended a fistulogram but apparently she signed out [place].  She was set up for a fistulogram.  On my history, she said she had an infiltrate in the proximal fistula.  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  There is  also an area of swelling over the fistula which looks to be at risk for bleeding.  The fistula is slightly pulsatile. PROCEDURE:  Ultrasound-guided access to left brachiocephalic fistula Fistulogram left brachiocephalic fistula SURGEON: Judeth Cornfield. Scot Dock, MD, FACS ANESTHESIA: Local EBL: Min mL TECHNIQUE: The patient was brought to the [facility] lab.  The left arm was prepped and draped in usual sterile fashion.  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  A micropuncture sheath was introduced over the wire.  Fistulogram was then obtained to evaluate the fistula from the point of cannulation to include the central veins.  I did not compress the fistula to do a reflux shot because of the wound that was at risk for bleeding.  At the completion of 4-0 Monocryl was placed across the cannulation site.  There was good hemostasis. FINDINGS: There is no central venous stenosis. The upper arm fistula is widely patent although it is markedly tortuous and thus would be very difficult to cannulate. CLINICAL NOTE: The she will be transferred to the operating room for exploration of the fistula, possible ligation of the fistula or revision, and placement of a tunneled dialysis catheter.  I have reviewed the indications for the procedure with the patient and the potential complications and she is agreeable to proceed. [name], MD, FACS [facility]    Medications:  sodium chloride     piperacillin-tazobactam (ZOSYN)  IV 2.25 g (12/19/21 1826)   vancomycin 1,000 mg (12/20/21 1109)    sodium chloride   Intravenous Once   aspirin EC  81 mg Oral Daily   calcium acetate  2,001 mg Oral TID WC   Chlorhexidine Gluconate Cloth  6 each Topical Q0600   cinacalcet  60 mg Oral QPM   furosemide  80 mg Intravenous Once   heparin  5,000 Units Subcutaneous  Q12H   heparin sodium (porcine)       pantoprazole  40 mg Oral Daily   sevelamer carbonate  1,600 mg Oral TID WC   sodium chloride flush  3 mL Intravenous Q12H    Dialysis Orders: 3h 72min   400/600   88kg  2/2 bath   P4   Hep 6600  new TDC (AVF revised, don't use for now) - last HD 10/5, post wt 90kg   - last Hb 7.6 - mircera 225 q2, last 9/26 - hectorol 10 ug tiw IV tts     BP 180/ 80   RA 100% ,now on 4 L [place]    CXR 1 - vasc congestion and early edema    CXR 2 -  worsening edema   Assessment/ Plan: Infected AVF - buttonhole infection was excised and new TDC placed 10/06.  On Vanc and zosyn w/HD.  F/u in 2 weeks per vascular.   SOB/ pulm edema - by CXR. HD this AM.  Likely has had weight loss and needs dry  weight lowered.  ESRD - on HD TTS. HD today per regular schedule.  Anemia esrd - Hb  drop 6.9 this AM.  1 unit pRBC ordered with HD today.  Reports severe pain w/transfusions, ordered 0.5mg  dilaudid w/infusion.  Bmd ckd - Calcium at goal. Check phos. Continue VDRA and binders.  BP/vol - not on BP lowering meds at home. BP improving with HD.   Nutrition - Renal diet w/fluid restrictions  Jen Mow, PA-C [facility] 12/20/2021,11:19 AM  LOS: 0 days

## 2021-12-20 NOTE — TOC Initial Note (Signed)
Transition of Care [facility]) - Initial/Assessment Note    Patient Details  Name: Desiree [name] MRN: [id number] Date of Birth: 1976/02/25  Transition of Care [facility]) CM/SW Contact:    Bartholomew Crews, RN Phone Number: [phone] 12/20/2021, 2:21 PM  Clinical Narrative:                  Spoke with patient at the bedside to discuss post acute transition. She stated that she, her husband, and her dad have found a 1-level apartment to share and no longer is homeless. She understands that she needs extra HD this admission to determine her actual EDW. Her husband provides transportation to HD, and she is aware of Medicaid transportation resources. Patient is currently on oxygen - TOC to follow for home oxygen needs at discharge.   Expected Discharge Plan: Home/Self Care Barriers to Discharge: Continued Medical Work up   Patient Goals and CMS Choice Patient states their goals for this hospitalization and ongoing recovery are:: return home with husband CMS Medicare.gov Compare Post Acute Care list provided to:: Patient Choice offered to / list presented to : NA  Expected Discharge Plan and Services Expected Discharge Plan: Home/Self Care   Discharge Planning Services: CM Consult Post Acute Care Choice: NA Living arrangements for the past 2 months: Hotel/Motel (will be moving into a house at discharge)                 DME Arranged: N/A DME Agency: NA       HH Arranged: NA [place] Agency: NA        Prior Living Arrangements/Services Living arrangements for the past 2 months: Hotel/Motel (will be moving into a house at discharge) Lives with:: Self, Spouse Patient language and need for interpreter reviewed:: Yes Do you feel safe going back to the place where you live?: Yes      Need for Family Participation in Patient Care: Yes (Comment) Care giver support system in place?: Yes (comment)   Criminal Activity/Legal Involvement Pertinent to Current Situation/Hospitalization: No - Comment as  needed  Activities of Daily Living      Permission Sought/Granted                  Emotional Assessment Appearance:: Appears stated age Attitude/Demeanor/Rapport: Engaged Affect (typically observed): Accepting Orientation: : Oriented to Self, Oriented to Place, Oriented to  Time, Oriented to Situation Alcohol / Substance Use: Not Applicable Psych Involvement: No (comment)  Admission diagnosis:  CHF (congestive heart failure) (HCC) [I50.9] Patient Active Problem List   Diagnosis Date Noted   CHF (congestive heart failure) ([place]) 12/19/2021   Acute respiratory failure with hypoxia ([place]) 12/05/2021   Hypertensive urgency 12/05/2021   Prolonged QT interval 12/05/2021   Anxiety    Acute gastroenteritis 02/16/2021   Sepsis ([place]) 01/11/2021   GERD (gastroesophageal reflux disease) 01/11/2021   Hyponatremia 01/11/2021   Pulmonary edema 01/11/2021   ESRD (end stage renal disease) on dialysis ([place]) 06/02/2018   Acute respiratory failure ([place]) 06/02/2018   Fever 12/07/2017   Abdominal pain 08/03/2016   Chronic pain 01/21/2016   Muscle cramps 03/29/2015   Anemia of chronic kidney failure 09/15/2013   Hyperkalemia 08/29/2013   Cocaine abuse ([place]) 08/26/2013   Depression 04/21/2012   ESRD (end stage renal disease) ([place]) 08/03/2011   PCP:  Harvie Junior, MD Pharmacy:   [facility] [place] [phone]  [place] [address] Phone: [phone] Fax: [phone]     Social Determinants of Health (SDOH) Interventions    Readmission Risk Interventions     No data to display

## 2021-12-20 NOTE — Assessment & Plan Note (Signed)
Continue antiacid therapy  ?

## 2021-12-20 NOTE — Procedures (Signed)
HD Note:  Some information was entered later than the data was gathered due to patient care needs. The stated time with the data is accurate. Received patient in bed to unit.  Alert and oriented.  Informed consent signed and in chart.   Patient received a unit of blood, consent signed.  Patient reports that she historically has pain with blood transfusion.  Dilaudid given with limited results.  Patient reported cramping to both legs.  UF had to be stopped and 100 ml saline bolus given.   Transported back to the room  Alert, without acute distress.  Hand-off given to patient's nurse.   Access used: HD catheter Access issues: None  Total UF removed: 2700 Medication(s) given: Vancomycin see MAR    [facility]

## 2021-12-20 NOTE — Progress Notes (Signed)
  Progress Note   Patient: Desiree [name] UXL:[id number] DOB: [birth year] DOA: 12/19/2021     0 DOS: the patient was seen and examined on 12/20/2021   Brief hospital course: Desiree [name] was admitted to the hospital with the working diagnosis of volume overload, in the setting of AV fistula malfunction and infected skin overlying AV fistula.   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 For the last 4 weeks she had difficulty with HD access, frequent clogging, and left upper extremity painful edema. Over last 2 days she reported having worsening dyspnea, and lower extremity edema. She was sent to the hospital by vascular surgery for AV fistulogram. On her initial physical examination her blood pressure was 158/91, HR 88, RR 17 and 02 saturation 100%, lungs with rales at bases with increased work of breathing, heart with S1 and S2 present and rhythmic, with no rubs or gallops, abdomen with no distention and positive lower extremity edema.   Na 137, K 5,7 Cl 97, bicarbonate 22, glucose 119 bun 53 cr 15.2 Wbc 8,1 hgb 6,9 plt 258   Chest radiograph with cardiomegaly and symmetric bilateral interstitial infiltrates.   Patient underwent emergent right IJ tunneled HD catheter placement.  Excision of infected skin overlying AV fistula and repair of AV fistula buttonhole.  Emergent HD  10/07 HD       Assessment and Plan: * ESRD (end stage renal disease) on dialysis [facility]) Patient with volume overload due to malfunction AV fistula.  Complicated with acute hypoxemic respiratory failure.  Hyperkalemia.   Patient had emergent right IJ tunneled catheter placement and urgent HD on admission.   Follow up K this am 5,7 with serum bicarbonate 22.   Today had HD for second consecutive day.  Plan to continue renal replacement therapy inpatient per nephrology  recommendations.   Metabolic bone disease, continue with phoslo, cinacalcet,   Continue supplemental 02 per [place] to keep 02 saturation 92% or grater.   Hypertensive urgency Blood pressure has improved with ultrafiltration.  Blood pressure this am is 144/81.   GERD (gastroesophageal reflux disease) Continue antiacid therapy.   Skin infection Sp purulent pocket overlying the buttonhole which was excised on admission.  Currently on Vancomycin and Zosyn for antibiotic therapy.   Class 1 obesity Calculated BMI is 32.9         Subjective: Patient is feeling better, mid pain at the surgical site, dyspnea has improved,   Physical Exam: Vitals:   12/20/21 1239 12/20/21 1322 12/20/21 1340 12/20/21 1400  BP: (!) 187/75 (!) 144/81    Pulse: 79 86    Resp: (!) 22 (!) 26    Temp:      TempSrc:      SpO2: 100% 100% (!) 89% 95%  Weight:      Height:       Neurology awake and alert ENT with mild pallor Cardiovascular with S1 and S2 present and rhythmic with no gallops, rubs or murmurs Respiratory with no rales or wheezing Abdomen soft and non tender Data Reviewed:    Family Communication: no family at the bedside   Disposition: Status is: Observation The patient remains OBS appropriate and will d/c before 2 midnights.  Planned Discharge Destination: Home      Author: Tawni Millers, MD 12/20/2021 2:44 PM  For on call review [email or web address].

## 2021-12-20 NOTE — Assessment & Plan Note (Addendum)
Systolic blood pressure 847 mmHg. Will start hydralazine for better blood pressure control Patient had angioedema to amlodipine per medical record.  Continue with HD and ultrafiltration for fluid management.

## 2021-12-20 NOTE — Progress Notes (Signed)
Date and time results received: 12/20/21 0811 (use smartphrase ".now" to insert current time)  Test: hgb  Critical Value: 6.9  Name of Provider Notified: Dr Cathlean Sauer  Orders Received? Or Actions Taken?: waiting for orders. Marcille Blanco, RN

## 2021-12-20 NOTE — Assessment & Plan Note (Addendum)
Sp purulent pocket overlying the buttonhole which was excised on admission.  Patient was placed on IV Vancomycin and Zosyn for antibiotic therapy.  Culture positive for enterobacter, antibiotic therapy was transitioned to cefepime, and she will continue full course of 7 days therapy as outpatient with cefepime administered on hemodialysis.

## 2021-12-20 NOTE — Assessment & Plan Note (Addendum)
Patient with volume overload due to malfunction AV fistula.  Complicated with acute hypoxemic respiratory failure.  Hyperkalemia.   Patient had emergent right IJ tunneled catheter placement and urgent HD on admission.   Patient had HD 10/06, 10/07 and 10/09 with good toleration.   At the time of her discharge her K was 3,8 and serum bicarbonate at 26.  Na 795   Metabolic bone disease, continue with phoslo, cinacalcet.    Anemia of chronic renal disease.  Sp 1 unit PRBC transfusion. No clinical signs of acute bleeding.  Follow up hgb is 8,3   Acute on chronic hypoxemic respiratory failure. Her oxygenation improved but continue to be low after ultrafiltration. Her 02 saturation drooped to 86% on room air on ambulation, patient will have home 02 arranged and follow up oxymetry as outpatient.

## 2021-12-20 NOTE — Hospital Course (Addendum)
Desiree [name] was admitted to the hospital with the working diagnosis of volume overload, in the setting of AV fistula malfunction and infected skin overlying AV fistula.   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 For the last 4 weeks she had difficulty with HD access, frequent clogging, and left upper extremity painful edema. Over last 2 days she reported having worsening dyspnea, and lower extremity edema. She was sent to the hospital by vascular surgery for AV fistulogram. On her initial physical examination her blood pressure was 158/91, HR 88, RR 17 and 02 saturation 100%, lungs with rales at bases with increased work of breathing, heart with S1 and S2 present and rhythmic, with no rubs or gallops, abdomen with no distention and positive lower extremity edema.   Na 137, K 5,7 Cl 97, bicarbonate 22, glucose 119 bun 53 cr 15.2 Wbc 8,1 hgb 6,9 plt 258   Chest radiograph with cardiomegaly and symmetric bilateral interstitial infiltrates.   Patient underwent emergent right IJ tunneled HD catheter placement.  Excision of infected skin overlying AV fistula and repair of AV fistula buttonhole.  Emergent HD  10/07 HD

## 2021-12-20 NOTE — Care Management Obs Status (Signed)
[place] NOTIFICATION   Patient Details  Name: Desiree [name] MRN: [id number] Date of Birth: [birth year]   Medicare Observation Status Notification Given:  Yes    Bartholomew Crews, RN 12/20/2021, 2:18 PM

## 2021-12-21 ENCOUNTER — Observation Stay (HOSPITAL_COMMUNITY): Payer: 59

## 2021-12-21 DIAGNOSIS — F32A Depression, unspecified: Secondary | ICD-10-CM | POA: Diagnosis present

## 2021-12-21 DIAGNOSIS — I2510 Atherosclerotic heart disease of native coronary artery without angina pectoris: Secondary | ICD-10-CM | POA: Diagnosis present

## 2021-12-21 DIAGNOSIS — K219 Gastro-esophageal reflux disease without esophagitis: Secondary | ICD-10-CM | POA: Diagnosis present

## 2021-12-21 DIAGNOSIS — E669 Obesity, unspecified: Secondary | ICD-10-CM | POA: Diagnosis present

## 2021-12-21 DIAGNOSIS — J449 Chronic obstructive pulmonary disease, unspecified: Secondary | ICD-10-CM | POA: Diagnosis present

## 2021-12-21 DIAGNOSIS — D631 Anemia in chronic kidney disease: Secondary | ICD-10-CM | POA: Diagnosis present

## 2021-12-21 DIAGNOSIS — E875 Hyperkalemia: Secondary | ICD-10-CM | POA: Diagnosis present

## 2021-12-21 DIAGNOSIS — F419 Anxiety disorder, unspecified: Secondary | ICD-10-CM | POA: Diagnosis present

## 2021-12-21 DIAGNOSIS — N186 End stage renal disease: Secondary | ICD-10-CM | POA: Diagnosis present

## 2021-12-21 DIAGNOSIS — M109 Gout, unspecified: Secondary | ICD-10-CM | POA: Diagnosis present

## 2021-12-21 DIAGNOSIS — E785 Hyperlipidemia, unspecified: Secondary | ICD-10-CM | POA: Diagnosis present

## 2021-12-21 DIAGNOSIS — J9621 Acute and chronic respiratory failure with hypoxia: Secondary | ICD-10-CM | POA: Diagnosis present

## 2021-12-21 DIAGNOSIS — Y712 Prosthetic and other implants, materials and accessory cardiovascular devices associated with adverse incidents: Secondary | ICD-10-CM | POA: Diagnosis present

## 2021-12-21 DIAGNOSIS — Z8249 Family history of ischemic heart disease and other diseases of the circulatory system: Secondary | ICD-10-CM | POA: Diagnosis not present

## 2021-12-21 DIAGNOSIS — I5031 Acute diastolic (congestive) heart failure: Secondary | ICD-10-CM | POA: Diagnosis present

## 2021-12-21 DIAGNOSIS — M898X9 Other specified disorders of bone, unspecified site: Secondary | ICD-10-CM | POA: Diagnosis present

## 2021-12-21 DIAGNOSIS — E8770 Fluid overload, unspecified: Secondary | ICD-10-CM | POA: Diagnosis present

## 2021-12-21 DIAGNOSIS — Z6832 Body mass index (BMI) 32.0-32.9, adult: Secondary | ICD-10-CM | POA: Diagnosis not present

## 2021-12-21 DIAGNOSIS — Y841 Kidney dialysis as the cause of abnormal reaction of the patient, or of later complication, without mention of misadventure at the time of the procedure: Secondary | ICD-10-CM | POA: Diagnosis present

## 2021-12-21 DIAGNOSIS — Z992 Dependence on renal dialysis: Secondary | ICD-10-CM | POA: Diagnosis not present

## 2021-12-21 DIAGNOSIS — I160 Hypertensive urgency: Secondary | ICD-10-CM | POA: Diagnosis present

## 2021-12-21 DIAGNOSIS — I132 Hypertensive heart and chronic kidney disease with heart failure and with stage 5 chronic kidney disease, or end stage renal disease: Secondary | ICD-10-CM | POA: Diagnosis present

## 2021-12-21 DIAGNOSIS — Z83438 Family history of other disorder of lipoprotein metabolism and other lipidemia: Secondary | ICD-10-CM | POA: Diagnosis not present

## 2021-12-21 DIAGNOSIS — I509 Heart failure, unspecified: Secondary | ICD-10-CM | POA: Diagnosis present

## 2021-12-21 DIAGNOSIS — G43909 Migraine, unspecified, not intractable, without status migrainosus: Secondary | ICD-10-CM | POA: Diagnosis present

## 2021-12-21 DIAGNOSIS — T82838A Hemorrhage of vascular prosthetic devices, implants and grafts, initial encounter: Secondary | ICD-10-CM | POA: Diagnosis present

## 2021-12-21 DIAGNOSIS — R634 Abnormal weight loss: Secondary | ICD-10-CM | POA: Diagnosis present

## 2021-12-21 DIAGNOSIS — Z833 Family history of diabetes mellitus: Secondary | ICD-10-CM | POA: Diagnosis not present

## 2021-12-21 LAB — BASIC METABOLIC PANEL
Anion gap: 12 (ref 5–15)
BUN: 30 mg/dL — ABNORMAL HIGH (ref 6–20)
CO2: 27 mmol/L (ref 22–32)
Calcium: 8.9 mg/dL (ref 8.9–10.3)
Chloride: 94 mmol/L — ABNORMAL LOW (ref 98–111)
Creatinine, Ser: 9.43 mg/dL — ABNORMAL HIGH (ref 0.44–1.00)
GFR, Estimated: 5 mL/min — ABNORMAL LOW (ref >60)
Glucose, Bld: 100 mg/dL — ABNORMAL HIGH (ref 70–99)
Potassium: 3.6 mmol/L (ref 3.5–5.1)
Sodium: 133 mmol/L — ABNORMAL LOW (ref 135–145)

## 2021-12-21 LAB — TYPE AND SCREEN
ABO/RH(D): B NEG
Antibody Screen: NEGATIVE
Unit division: 0

## 2021-12-21 LAB — CBC
HCT: 26 % — ABNORMAL LOW (ref 36.0–46.0)
Hemoglobin: 8.3 g/dL — ABNORMAL LOW (ref 12.0–15.0)
MCH: 26.9 pg (ref 26.0–34.0)
MCHC: 31.9 g/dL (ref 30.0–36.0)
MCV: 84.4 fL (ref 80.0–100.0)
Platelets: 227 10*3/uL (ref 150–400)
RBC: 3.08 MIL/uL — ABNORMAL LOW (ref 3.87–5.11)
RDW: 16 % — ABNORMAL HIGH (ref 11.5–15.5)
WBC: 7 10*3/uL (ref 4.0–10.5)
nRBC: 0 % (ref 0.0–0.2)

## 2021-12-21 LAB — BPAM RBC
Blood Product Expiration Date: 202310252359
ISSUE DATE / TIME: 202310071000
Unit Type and Rh: 1700

## 2021-12-21 LAB — PHOSPHORUS: Phosphorus: 5.6 mg/dL — ABNORMAL HIGH (ref 2.5–4.6)

## 2021-12-21 MED ORDER — CHLORHEXIDINE GLUCONATE CLOTH 2 % EX PADS: 6.0000 | MEDICATED_PAD | Freq: Every day | CUTANEOUS | Status: DC

## 2021-12-21 MED ORDER — HYDRALAZINE HCL 50 MG PO TABS
50.0000 mg | ORAL_TABLET | Freq: Two times a day (BID) | ORAL | Status: DC
  Filled 2021-12-21 (×2): qty 1

## 2021-12-21 NOTE — Progress Notes (Signed)
[facility] Progress Note   Subjective:   Patient seen and examined at bedside.  Tolerated dialysis well overnight yesterday.  Reports breathing improved as long as she is wearing O2, but she does not thik she will be able to wait until Tuesday for dialysis.  States she was prescribed 3L O2 at home by Cardiology, Dr. Harrell Gave, but never received it.  Admits to scratchy throat.  Edema improved.  Denies CP, abdominal pain, n/v/d, weakness and dizziness.  States she is sleepy.   Objective Vitals:   12/21/21 0031 12/21/21 0439 12/21/21 0818 12/21/21 1132  BP: (!) 150/71 (!) 149/79 (!) 147/73 (!) 145/74  Pulse: 83 83 81 80  Resp: 17 20 18 18   Temp: 99.1 F (37.3 C) 98.8 F (37.1 C) 99 F (37.2 C) 99.2 F (37.3 C)  TempSrc: Oral Oral Oral Oral  SpO2: 100% 99% 98% 93%  Weight:  89.6 kg    Height:       Physical Exam General:chronically ill appearing female in NAD Heart:RRR, no mrg Lungs: mostly CTAB, nml WOB on 2L O2 Abdomen:soft, NTND Extremities:no LE edema Dialysis Access: TDC, L arm wrapped in ACE wrap   Filed Weights   12/20/21 0253 12/20/21 0818 12/21/21 0439  Weight: 92.4 kg 89.9 kg 89.6 kg    Intake/Output Summary (Last 24 hours) at 12/21/2021 1133 Last data filed at 12/21/2021 0034 Gross per 24 hour  Intake 1554.33 ml  Output 3400 ml  Net -1845.67 ml    Additional Objective Labs: Basic Metabolic Panel: Recent Labs  Lab 12/19/21 0843 12/20/21 0125 12/21/21 0056  NA 138 137 133*  K 4.0 5.7* 3.6  CL 97* 97* 94*  CO2  --  22 27  GLUCOSE 80 119* 100*  BUN 41* 53* 30*  CREATININE 15.00* 15.22* 9.43*  CALCIUM  --  9.3 8.9   CBC: Recent Labs  Lab 12/19/21 0843 12/20/21 0743 12/21/21 0056  WBC  --  8.1 7.0  HGB 7.8* 6.9* 8.3*  HCT 23.0* 22.2* 26.0*  MCV  --  86.4 84.4  PLT  --  258 227   Blood Culture    Component Value Date/Time   SDES WOUND 12/19/2021 1514   SPECREQUEST LEFT AV FISTULA 12/19/2021 1514   CULT  12/19/2021 1514     RARE ENTEROBACTER AEROGENES NO ANAEROBES ISOLATED; CULTURE IN PROGRESS FOR 5 DAYS    REPTSTATUS PENDING 12/19/2021 1514     Studies/Results: DG CHEST PORT 1 VIEW  Result Date: 12/21/2021 CLINICAL DATA:  Shortness of breath EXAM: PORTABLE CHEST 1 VIEW COMPARISON:  Chest x-ray October 6th, 2023 FINDINGS: Stable cardiomediastinal contours. Decreased bilateral interstitial pulmonary opacities. Decreased bilateral pleural effusions. No large pneumothorax. No acute osseous abnormality. The visualized upper abdomen is unremarkable. IMPRESSION: Decreased now mild pulmonary edema and small bilateral pleural effusions. Electronically Signed   By: Beryle Flock M.D.   On: 12/21/2021 11:20   DG Chest Port 1 View  Result Date: 12/19/2021 CLINICAL DATA:  Status post dialysis catheter insertion. EXAM: PORTABLE CHEST 1 VIEW COMPARISON:  Chest x-ray December 19, 2021 FINDINGS: Interval placement of right central venous catheter, terminating in the right atrium. Unchanged cardiomegaly.  Stable mediastinal contours. Increased diffuse hazy pulmonary opacities. Left-greater-than-right pleural effusions. The left costophrenic angle is excluded from the field of view. No large pneumothorax. No acute osseous abnormality. The visualized upper abdomen is unremarkable. IMPRESSION: 1. Appropriate positioning of the right central venous catheter. 2. Increased moderate pulmonary edema and left-greater-than-right pleural effusions. Electronically Signed  By: Beryle Flock M.D.   On: 12/19/2021 16:18   DG C-Arm 1-60 Min-No Report  Result Date: 12/19/2021 Fluoroscopy was utilized by the requesting physician.  No radiographic interpretation.   DG Chest 1 View  Result Date: 12/19/2021 CLINICAL DATA:  Shortness of breath EXAM: CHEST  1 VIEW COMPARISON:  Previous studies including the examination of 12/04/2021 FINDINGS: Transverse diameter of heart is increased. Central pulmonary vessels are prominent suggesting CHF. There is  subtle increase in interstitial markings in parahilar regions and lower lung fields. There is no focal consolidation. There is minimal blunting of left lateral CP angle. There is no pneumothorax. IMPRESSION: Cardiomegaly. Central pulmonary vessels are prominent suggesting CHF. Prominence of interstitial markings in parahilar regions and lower lung fields suggest interstitial pulmonary edema. Small left pleural effusion. Electronically Signed   By: Elmer Picker M.D.   On: 12/19/2021 13:32    Medications:  sodium chloride     piperacillin-tazobactam (ZOSYN)  IV 2.25 g (12/21/21 0818)   vancomycin 1,000 mg (12/20/21 1109)    sodium chloride   Intravenous Once   aspirin EC  81 mg Oral Daily   calcium acetate  2,001 mg Oral TID WC   Chlorhexidine Gluconate Cloth  6 each Topical Q0600   cinacalcet  60 mg Oral QPM   furosemide  80 mg Intravenous Once   heparin  5,000 Units Subcutaneous Q12H   hydrALAZINE  50 mg Oral Q12H   pantoprazole  40 mg Oral Daily   sevelamer carbonate  1,600 mg Oral TID WC   sodium chloride flush  3 mL Intravenous Q12H    Dialysis Orders: 3h 39min   400/600   88kg  2/2 bath   P4   Hep 6600  new TDC (AVF revised, don't use for now) - last HD 10/5, post wt 90kg   - last Hb 7.6 - mircera 225 q2, last 9/26 - hectorol 10 ug tiw IV tts     BP 180/ 80   RA 100% ,now on 4 L     CXR 1 - vasc congestion and early edema    CXR 2 -  worsening edema   Assessment/ Plan: Infected AVF - buttonhole infection was excised and new TDC placed 10/06.  On Vanc and zosyn w/HD.  F/u in 2 weeks per vascular.   SOB/ pulm edema - HD yesterday with net UF~2L. CXR with improved, continues to have mild pulmonary edema.  Plan for extra HD in AM w/max UF as tolerated. PMD working to get home O2 set up as previously prescribed as long as continues to have need.  ESRD - on HD TTS. Extra HD tomorrow for volume removal.  Anemia esrd - Hgb improved to 8.3 s/p 1 unit pRBC.  ESA due  Tuesday. Bmd ckd - Calcium at goal. Check phos. Continue VDRA and binders.  BP/vol - not on BP lowering meds at home. BP improving with HD.   Nutrition - Renal diet w/fluid restrictions  Jen Mow, PA-C [facility] 12/21/2021,11:33 AM  LOS: 0 days

## 2021-12-21 NOTE — Progress Notes (Signed)
Patient seen and examined this morning. Pain well controlled  Normal hand function, no paresthesias, no motor deficits Minimal edema Palpable brachial pulse distal to the fistula  Dressing changed, site looks good.   Patient okay for discharge from a vascular surgery perspective, I will set her for follow-up in 2 weeks in our office for wound check.  Wound care in the outpatient setting should be dry dressing with light wrap daily.  Broadus John MD

## 2021-12-21 NOTE — Progress Notes (Addendum)
Progress Note   Patient: Desiree [name] KGY:[id number] DOB: [birth year] DOA: 12/19/2021     0 DOS: the patient was seen and examined on 12/21/2021   Brief hospital course: Mrs. Blinn was admitted to the hospital with the working diagnosis of volume overload, in the setting of AV fistula malfunction and infected skin overlying AV fistula.   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 For the last 4 weeks she had difficulty with HD access, frequent clogging, and left upper extremity painful edema. Over last 2 days she reported having worsening dyspnea, and lower extremity edema. She was sent to the hospital by vascular surgery for AV fistulogram. On her initial physical examination her blood pressure was 158/91, HR 88, RR 17 and 02 saturation 100%, lungs with rales at bases with increased work of breathing, heart with S1 and S2 present and rhythmic, with no rubs or gallops, abdomen with no distention and positive lower extremity edema.   Na 137, K 5,7 Cl 97, bicarbonate 22, glucose 119 bun 53 cr 15.2 Wbc 8,1 hgb 6,9 plt 258   Chest radiograph with cardiomegaly and symmetric bilateral interstitial infiltrates.   Patient underwent emergent right IJ tunneled HD catheter placement.  Excision of infected skin overlying AV fistula and repair of AV fistula buttonhole.  Emergent HD  10/07 HD   Assessment and Plan: * ESRD (end stage renal disease) on dialysis [facility]) Patient with volume overload due to malfunction AV fistula.  Complicated with acute hypoxemic respiratory failure.  Hyperkalemia.   Patient had emergent right IJ tunneled catheter placement and urgent HD on admission.   Patient had HD 10/06 and 10/07  Today her volume status has improved.  Her oxymetry is 98% on 2 L min per [place].   Metabolic bone disease, continue with phoslo, cinacalcet,   Anemia of  chronic renal disease.  Sp 1 unit PRBC transfusion. No clinical signs of acute bleeding.  Follow up hgb is 8,3   Hypertensive urgency Systolic blood pressure 026 mmHg. Will start hydralazine for better blood pressure control Patient had angioedema to amlodipine per medical record.  Continue with HD and ultrafiltration for fluid management.   GERD (gastroesophageal reflux disease) Continue antiacid therapy.   Skin infection Sp purulent pocket overlying the buttonhole which was excised on admission.  Currently on Vancomycin and Zosyn for antibiotic therapy.  Will check with vascular surgery duration of antibiotic therapy.   Class 1 obesity Calculated BMI is 32.9         Subjective: Patient is feeling better, no chest pain and dyspnea has improved.,   Physical Exam: Vitals:   12/20/21 2003 12/21/21 0031 12/21/21 0439 12/21/21 0818  BP: (!) 148/69 (!) 150/71 (!) 149/79 (!) 147/73  Pulse: 85 83 83 81  Resp: 20 17 20 18   Temp: 99.2 F (37.3 C) 99.1 F (37.3 C) 98.8 F (37.1 C) 99 F (37.2 C)  TempSrc: Oral Oral Oral Oral  SpO2: 99% 100% 99% 98%  Weight:   89.6 kg   Height:       Neurology awake and alert,  ENT with mild pallor Cardiovascular with S1 and S2 present and rhythmic with no gallops or murmurs Respiratory with no rales or wheezing Abdomen with no distention  No lower extremity edema  Data Reviewed:    Family Communication: no family at the bedside   Disposition: Status is: Observation  The patient remains OBS appropriate and will d/c before 2 midnights.  Planned Discharge Destination: Home    Author: Tawni Millers, MD 12/21/2021 10:40 AM  For on call review [email or web address].

## 2021-12-21 NOTE — Progress Notes (Signed)
Patient Saturations on Room Air at Rest = 86%  Patient Saturations on [employer] while Ambulating = 88%  Patient Saturations on 2 Liters of oxygen while Ambulating = 92%

## 2021-12-22 ENCOUNTER — Other Ambulatory Visit (HOSPITAL_COMMUNITY): Payer: Self-pay

## 2021-12-22 DIAGNOSIS — E8770 Fluid overload, unspecified: Secondary | ICD-10-CM

## 2021-12-22 LAB — BASIC METABOLIC PANEL
Anion gap: 14 (ref 5–15)
BUN: 49 mg/dL — ABNORMAL HIGH (ref 6–20)
CO2: 29 mmol/L (ref 22–32)
Calcium: 9.6 mg/dL (ref 8.9–10.3)
Chloride: 94 mmol/L — ABNORMAL LOW (ref 98–111)
Creatinine, Ser: 11.99 mg/dL — ABNORMAL HIGH (ref 0.44–1.00)
GFR, Estimated: 4 mL/min — ABNORMAL LOW (ref >60)
Glucose, Bld: 102 mg/dL — ABNORMAL HIGH (ref 70–99)
Potassium: 4.3 mmol/L (ref 3.5–5.1)
Sodium: 137 mmol/L (ref 135–145)

## 2021-12-22 LAB — CBC
HCT: 24.9 % — ABNORMAL LOW (ref 36.0–46.0)
HCT: 24 % — ABNORMAL LOW (ref 36.0–46.0)
Hemoglobin: 7.7 g/dL — ABNORMAL LOW (ref 12.0–15.0)
Hemoglobin: 7.2 g/dL — ABNORMAL LOW (ref 12.0–15.0)
MCH: 26.6 pg (ref 26.0–34.0)
MCH: 26.7 pg (ref 26.0–34.0)
MCHC: 30.9 g/dL (ref 30.0–36.0)
MCHC: 30 g/dL (ref 30.0–36.0)
MCV: 85.9 fL (ref 80.0–100.0)
MCV: 88.9 fL (ref 80.0–100.0)
Platelets: 238 10*3/uL (ref 150–400)
Platelets: 230 10*3/uL (ref 150–400)
RBC: 2.9 MIL/uL — ABNORMAL LOW (ref 3.87–5.11)
RBC: 2.7 MIL/uL — ABNORMAL LOW (ref 3.87–5.11)
RDW: 15.6 % — ABNORMAL HIGH (ref 11.5–15.5)
RDW: 15.7 % — ABNORMAL HIGH (ref 11.5–15.5)
WBC: 7 10*3/uL (ref 4.0–10.5)
WBC: 6.9 10*3/uL (ref 4.0–10.5)
nRBC: 0 % (ref 0.0–0.2)
nRBC: 0 % (ref 0.0–0.2)

## 2021-12-22 LAB — RENAL FUNCTION PANEL
Albumin: 2.4 g/dL — ABNORMAL LOW (ref 3.5–5.0)
Anion gap: 16 — ABNORMAL HIGH (ref 5–15)
BUN: 49 mg/dL — ABNORMAL HIGH (ref 6–20)
CO2: 26 mmol/L (ref 22–32)
Calcium: 9 mg/dL (ref 8.9–10.3)
Chloride: 96 mmol/L — ABNORMAL LOW (ref 98–111)
Creatinine, Ser: 11.95 mg/dL — ABNORMAL HIGH (ref 0.44–1.00)
GFR, Estimated: 4 mL/min — ABNORMAL LOW (ref >60)
Glucose, Bld: 95 mg/dL (ref 70–99)
Phosphorus: 6.8 mg/dL — ABNORMAL HIGH (ref 2.5–4.6)
Potassium: 3.8 mmol/L (ref 3.5–5.1)
Sodium: 138 mmol/L (ref 135–145)

## 2021-12-22 MED ORDER — ALTEPLASE 2 MG IJ SOLR: 2.0000 mg | Freq: Once | INTRAMUSCULAR | Status: DC | PRN

## 2021-12-22 MED ORDER — ALBUMIN HUMAN 25 % IV SOLN
INTRAVENOUS | Status: AC
  Filled 2021-12-22: qty 100

## 2021-12-22 MED ORDER — OXYCODONE HCL 10 MG PO TABS
10.0000 mg | ORAL_TABLET | Freq: Three times a day (TID) | ORAL | 0 refills | Status: DC | PRN
  Filled 2021-12-22: qty 5, 2d supply, fill #0

## 2021-12-22 MED ORDER — ANTICOAGULANT SODIUM CITRATE 4% (200MG/5ML) IV SOLN: 5.0000 mL | Status: DC | PRN

## 2021-12-22 MED ORDER — HEPARIN SODIUM (PORCINE) 1000 UNIT/ML IJ SOLN
INTRAMUSCULAR | Status: AC
  Administered 2021-12-22: 3000 [IU]
  Filled 2021-12-22: qty 3

## 2021-12-22 MED ORDER — LOPERAMIDE HCL 2 MG PO CAPS
2.0000 mg | ORAL_CAPSULE | Freq: Four times a day (QID) | ORAL | Status: DC | PRN
  Administered 2021-12-22: 2 mg via ORAL
  Filled 2021-12-22: qty 1

## 2021-12-22 MED ORDER — LIDOCAINE HCL (PF) 1 % IJ SOLN: 5.0000 mL | INTRAMUSCULAR | Status: DC | PRN

## 2021-12-22 MED ORDER — PENTAFLUOROPROP-TETRAFLUOROETH EX AERO: 1.0000 | INHALATION_SPRAY | CUTANEOUS | Status: DC | PRN

## 2021-12-22 MED ORDER — SODIUM CHLORIDE 0.9 % IV SOLN
2.0000 g | INTRAVENOUS | Status: DC
  Filled 2021-12-22: qty 12.5

## 2021-12-22 MED ORDER — SODIUM CHLORIDE 0.9 % IV SOLN
2.0000 g | INTRAVENOUS | Status: AC
  Administered 2021-12-22: 2 g via INTRAVENOUS
  Filled 2021-12-22: qty 12.5

## 2021-12-22 MED ORDER — LIDOCAINE-PRILOCAINE 2.5-2.5 % EX CREA: 1.0000 | TOPICAL_CREAM | CUTANEOUS | Status: DC | PRN

## 2021-12-22 MED ORDER — HYDRALAZINE HCL 50 MG PO TABS
50.0000 mg | ORAL_TABLET | Freq: Two times a day (BID) | ORAL | 0 refills | Status: DC
  Filled 2021-12-22: qty 60, 30d supply, fill #0

## 2021-12-22 MED ORDER — ACETAMINOPHEN 325 MG PO TABS: 650.0000 mg | ORAL_TABLET | ORAL | Status: DC | PRN

## 2021-12-22 MED ORDER — HEPARIN SODIUM (PORCINE) 1000 UNIT/ML DIALYSIS: 1000.0000 [IU] | INTRAMUSCULAR | Status: DC | PRN

## 2021-12-22 MED ORDER — SODIUM CHLORIDE 0.9 % IV SOLN: 2.0000 g | INTRAVENOUS | Status: AC

## 2021-12-22 MED ORDER — HEPARIN SODIUM (PORCINE) 1000 UNIT/ML DIALYSIS
2500.0000 [IU] | INTRAMUSCULAR | Status: DC | PRN
  Administered 2021-12-22: 3000 [IU] via INTRAVENOUS_CENTRAL
  Filled 2021-12-22 (×2): qty 3

## 2021-12-22 NOTE — Progress Notes (Signed)
   12/22/21 1600  Mobility  Activity Ambulated with assistance in hallway  Level of Assistance Contact guard assist, steadying assist  Assistive Device Front wheel walker  Distance Ambulated (ft) 50 ft  Activity Response Tolerated well  Mobility Referral Yes  $Mobility charge 1 Mobility   Mobility Specialist Progress Note  Pt was in bed and agreeable. X1 standing break d/t feeling SOB but recovered after being coached through pursed lip breathing. Returned to EOB w/ all needs met and call bell in reach.   Lucious Groves Mobility Specialist

## 2021-12-22 NOTE — Progress Notes (Addendum)
Pt receives out-pt HD at [place] on TTS. Pt arrives at 5:30 am for 5:45 chair time. Will assist as needed.   Melven Sartorius Renal Navigator [phone]  Addendum at 3:45 pm: D/C orders noted. Contacted [place] to advise staff pt will d/c today and will resume care in the morning.

## 2021-12-22 NOTE — Progress Notes (Addendum)
Pt being d/c, VSS, IV removed, going home with HD cath, Education complete.   Alvis Lemmings, RN 12/22/2021 3:22 PM

## 2021-12-22 NOTE — Progress Notes (Signed)
SATURATION QUALIFICATIONS: (This note is used to comply with regulatory documentation for home oxygen)  Patient Saturations on Room Air at Rest = 95%  Patient Saturations on Room Air while Ambulating = 86%  Patient Saturations on 2 Liters of oxygen while Ambulating = 98%  Please briefly explain why patient needs home oxygen:

## 2021-12-22 NOTE — TOC Transition Note (Signed)
Transition of Care [facility]) - CM/SW Discharge Note   Patient Details  Name: Desiree [name] MRN: [id number] Date of Birth: [birth year]  Transition of Care [facility]) CM/SW Contact:  Bartholomew Crews, RN Phone Number: [phone] 12/22/2021, 4:18 PM   Clinical Narrative:     Patient to transition home today. Spoke with patient at the bedside to discuss home oxygen needs. No provider preference. Referral to [place]. No further TOC needs identified at this time.   Final next level of care: Home/Self Care Barriers to Discharge: No Barriers Identified   Patient Goals and CMS Choice Patient states their goals for this hospitalization and ongoing recovery are:: return home with  husband CMS Medicare.gov Compare Post Acute Care list provided to:: Patient Choice offered to / list presented to : Patient  Discharge Placement                       Discharge Plan and Services   Discharge Planning Services: CM Consult Post Acute Care Choice: NA          DME Arranged: Oxygen DME Agency: [employer] Date DME Agency Contacted: 12/22/21 Time DME Agency Contacted: [phone] Representative spoke with at DME Agency: Mardene Celeste HH Arranged: NA [place] Agency: NA        Social Determinants of Health ([place]) Interventions     Readmission Risk Interventions     No data to display

## 2021-12-22 NOTE — Discharge Summary (Addendum)
Physician Discharge Summary   Patient: Desiree [name] MRN: [id number] DOB: [birth year]  Admit date:     12/19/2021  Discharge date: 12/22/21  Discharge Physician: Jimmy Picket    PCP: Harvie Junior, MD   Recommendations at discharge:    Patient will continue cefepime IV on HD for 2 more doses, this Tuesday and Thursday completing 7 days of therapy. Follow up with vascular surgery as outpatient.  Follow blood pressure as outpatient.  Home 02 has been arranged.   Discharge Diagnoses: Principal Problem:   ESRD (end stage renal disease) on dialysis [facility]) Active Problems:   Hypertensive urgency   GERD (gastroesophageal reflux disease)   Skin infection   Class 1 obesity   Volume overload  Resolved Problems:   * No resolved hospital problems. [facility] Course: Desiree [name] was admitted to the hospital with the working diagnosis of volume overload, in the setting of AV fistula malfunction and infected skin overlying AV fistula.   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 For the last 4 weeks she had difficulty with HD access, frequent clogging, and left upper extremity painful edema. Over last 2 days she reported having worsening dyspnea, and lower extremity edema. She was sent to the hospital by vascular surgery for AV fistulogram. On her initial physical examination her blood pressure was 158/91, HR 88, RR 17 and 02 saturation 100%, lungs with rales at bases with increased work of breathing, heart with S1 and S2 present and rhythmic, with no rubs or gallops, abdomen with no distention and positive lower extremity edema.   Na 137, K 5,7 Cl 97, bicarbonate 22, glucose 119 bun 53 cr 15.2 Wbc 8,1 hgb 6,9 plt 258   Chest radiograph with cardiomegaly and symmetric bilateral interstitial infiltrates.   Patient underwent emergent right IJ  tunneled HD catheter placement and hemodialysis with ultrafiltration.  Excision of infected skin overlying AV fistula and repair of AV fistula buttonhole.  10/07 HD  10/08 HD   Her volume status has improved. Patient will continue with outpatient antibiotic therapy, next HD 12/23/21 as outpatient.   Assessment and Plan: * ESRD (end stage renal disease) on dialysis [facility]) Patient with volume overload due to malfunction AV fistula.  Complicated with acute hypoxemic respiratory failure.  Hyperkalemia.   Patient had emergent right IJ tunneled catheter placement and urgent HD on admission.   Patient had HD 10/06, 10/07 and 10/09 with good toleration.   At the time of her discharge her K was 3,8 and serum bicarbonate at 26.  Na 546   Metabolic bone disease, continue with phoslo, cinacalcet.    Anemia of chronic renal disease.  Sp 1 unit PRBC transfusion. No clinical signs of acute bleeding.  Follow up hgb is 8,3   Acute on chronic hypoxemic respiratory failure. Her oxygenation improved but continue to be low after ultrafiltration. Her 02 saturation drooped to 86% on room air on ambulation, patient will have home 02 arranged and follow up oxymetry as outpatient.    Hypertensive urgency Patient underwent ultrafiltration with good toleration. At the time of her discharge her blood pressure is 123 to 150 mmHg. Close blood pressure monitoring as outpatient.   Per medical record patient had angioedema to amlodipine.  Continue with HD and ultrafiltration for fluid management.   GERD (gastroesophageal reflux disease) Continue antiacid therapy.   Skin infection Sp purulent  pocket overlying the buttonhole which was excised on admission.  Patient was placed on IV Vancomycin and Zosyn for antibiotic therapy.  Culture positive for enterobacter, antibiotic therapy was transitioned to cefepime, and she will continue full course of 7 days therapy as outpatient with cefepime administered on  hemodialysis.   Class 1 obesity Calculated BMI is 32.9          Consultants: vascular surgery  Procedures performed: as above   Disposition: Home Diet recommendation:  Discharge Diet Orders (From admission, onward)     Start     Ordered   12/22/21 0000  Diet - low sodium heart healthy        12/22/21 1506           Cardiac diet DISCHARGE MEDICATION: Allergies as of 12/22/2021       Reactions   Morphine Shortness Of Breath, Anaphylaxis   Prednisone Other (See Comments)   Other reaction(s): Other (See Comments) Muscle spasms Patient says prednisone causes her to cramp all over, muscle spasms uncontrolled   Tuna [fish Allergy] Itching, Swelling, Rash, Other (See Comments)   Face droops also   Amlodipine Other (See Comments)   Angioedema (09/05/17 ED visit)   Iodinated Contrast Media Itching   Tape Itching   Adhesive tape   paper tape ok        Medication List     STOP taking these medications    Auryxia 1 GM 210 MG(Fe) tablet Generic drug: ferric citrate   doxycycline 100 MG tablet Commonly known as: VIBRA-TABS   omeprazole 20 MG capsule Commonly known as: PRILOSEC   oxyCODONE-acetaminophen 10-325 MG tablet Commonly known as: PERCOCET       TAKE these medications    acetaminophen 325 MG tablet Commonly known as: TYLENOL Take 2 tablets (650 mg total) by mouth every 4 (four) hours as needed for headache or mild pain.   albuterol 108 (90 Base) MCG/ACT inhaler Commonly known as: VENTOLIN HFA Inhale 2 puffs into the lungs every 4 (four) hours as needed for wheezing or shortness of breath.   ALPRAZolam 1 MG tablet Commonly known as: XANAX Take 1 mg by mouth 3 (three) times daily as needed.   aspirin EC 81 MG tablet Take 81 mg by mouth daily. Swallow whole.   calcium acetate 667 MG capsule Commonly known as: PHOSLO Take 667-1,334 mg by mouth See admin instructions. 3 capsules with meals  1 to 2 capsules with snacks   ceFEPIme 2 g in sodium  chloride 0.9 % 100 mL Inject 2 g into the vein every Tuesday, Thursday, and Saturday at 6 PM for 2 doses. Start taking on: December 23, 2021   cinacalcet 60 MG tablet Commonly known as: SENSIPAR Take 60 mg by mouth every evening.   cyclobenzaprine 10 MG tablet Commonly known as: FLEXERIL Take 10 mg by mouth 2 (two) times daily as needed for muscle spasms.   Dialyvite 800 0.8 MG Wafr Take 1 tablet by mouth at bedtime.   diphenhydrAMINE 25 MG tablet Commonly known as: BENADRYL Take 1 tablet (25 mg total) by mouth every 6 (six) hours. What changed:  when to take this reasons to take this   lactulose 10 GM/15ML solution Commonly known as: CHRONULAC Take 30 mLs by mouth daily as needed for mild constipation, moderate constipation or severe constipation.   Narcan 4 MG/0.1ML Liqd nasal spray kit Generic drug: naloxone Place 1 spray into the nose as needed (accidental overdose).   nystatin-triamcinolone cream Commonly known as:  MYCOLOG II Apply topically 2 (two) times daily.   ondansetron 8 MG disintegrating tablet Commonly known as: ZOFRAN-ODT Take 8 mg by mouth every 8 (eight) hours as needed for nausea or vomiting.   Oxycodone HCl 10 MG Tabs Take 1 tablet (10 mg total) by mouth 3 (three) times daily as needed (severe pain). What changed:  when to take this reasons to take this   pantoprazole 20 MG tablet Commonly known as: PROTONIX Take 20 mg by mouth daily.   sevelamer carbonate 800 MG tablet Commonly known as: RENVELA Take 1,600 mg by mouth 3 (three) times daily.   SUMAtriptan 100 MG tablet Commonly known as: IMITREX Take 100 mg by mouth as directed.        Follow-up Information     [facility] Follow up in 2 week(s).   Specialty: Vascular Surgery Why: Office will call to arrange your appt(s) (sent) Contact information: [place] [address] [phone]               Discharge  Exam: Danley Danker Weights   12/22/21 0338 12/22/21 0755 12/22/21 1403  Weight: 91.1 kg 97.6 kg 93.9 kg   BP (!) 141/70 (BP Location: Left Leg)   Pulse 76   Temp 98 F (36.7 C) (Oral)   Resp 16   Ht 5' 5" (1.651 m)   Wt 93.9 kg   LMP 11/06/2021   SpO2 95%   BMI 34.45 kg/m   Patient with no chest pain or dyspnea, no lower extremity edema  Neurology awake and alert ENT with mild pallor Cardiovascular with S1 and S2 present and rhythmic with no gallops Respiratory with no rales or wheezing Abdomen with no distention  No lower extremity edema Right IJ tunneled catheter in place. Left upper extremity fistula in place, staples noted with clean wound  Condition at discharge: stable  The results of significant diagnostics from this hospitalization (including imaging, microbiology, ancillary and laboratory) are listed below for reference.   Imaging Studies: DG CHEST PORT 1 VIEW  Result Date: 12/21/2021 CLINICAL DATA:  Shortness of breath EXAM: PORTABLE CHEST 1 VIEW COMPARISON:  Chest x-ray October 6th, 2023 FINDINGS: Stable cardiomediastinal contours. Decreased bilateral interstitial pulmonary opacities. Decreased bilateral pleural effusions. No large pneumothorax. No acute osseous abnormality. The visualized upper abdomen is unremarkable. IMPRESSION: Decreased now mild pulmonary edema and small bilateral pleural effusions. Electronically Signed   By: Beryle Flock M.D.   On: 12/21/2021 11:20   DG Chest Port 1 View  Result Date: 12/19/2021 CLINICAL DATA:  Status post dialysis catheter insertion. EXAM: PORTABLE CHEST 1 VIEW COMPARISON:  Chest x-ray December 19, 2021 FINDINGS: Interval placement of right central venous catheter, terminating in the right atrium. Unchanged cardiomegaly.  Stable mediastinal contours. Increased diffuse hazy pulmonary opacities. Left-greater-than-right pleural effusions. The left costophrenic angle is excluded from the field of view. No large pneumothorax. No acute  osseous abnormality. The visualized upper abdomen is unremarkable. IMPRESSION: 1. Appropriate positioning of the right central venous catheter. 2. Increased moderate pulmonary edema and left-greater-than-right pleural effusions. Electronically Signed   By: Beryle Flock M.D.   On: 12/19/2021 16:18   DG C-Arm 1-60 Min-No Report  Result Date: 12/19/2021 Fluoroscopy was utilized by the requesting physician.  No radiographic interpretation.   DG Chest 1 View  Result Date: 12/19/2021 CLINICAL DATA:  Shortness of breath EXAM: CHEST  1 VIEW COMPARISON:  Previous studies including the examination of 12/04/2021 FINDINGS: Transverse diameter of heart is increased.  Central pulmonary vessels are prominent suggesting CHF. There is subtle increase in interstitial markings in parahilar regions and lower lung fields. There is no focal consolidation. There is minimal blunting of left lateral CP angle. There is no pneumothorax. IMPRESSION: Cardiomegaly. Central pulmonary vessels are prominent suggesting CHF. Prominence of interstitial markings in parahilar regions and lower lung fields suggest interstitial pulmonary edema. Small left pleural effusion. Electronically Signed   By: Elmer Picker M.D.   On: 12/19/2021 13:32   PERIPHERAL VASCULAR CATHETERIZATION  Result Date: 12/19/2021 Table formatting from the original result was not included. INDICATIONS:  NIKKOL PAI is a 46 y.o. female who was seen in consultation in the hospital with prolonged bleeding from her left upper arm fistula.  This was a brachiocephalic fistula that was placed in November 2020.  Dr. Stanford Breed recommended a fistulogram but apparently she signed out [place].  She was set up for a fistulogram.  On my history, she said she had an infiltrate in the proximal fistula.  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  There is also an area of swelling over the fistula which looks to be at  risk for bleeding.  The fistula is slightly pulsatile. PROCEDURE:  Ultrasound-guided access to left brachiocephalic fistula Fistulogram left brachiocephalic fistula SURGEON: Judeth Cornfield. Scot Dock, MD, FACS ANESTHESIA: Local EBL: Min mL TECHNIQUE: The patient was brought to the [facility] lab.  The left arm was prepped and draped in usual sterile fashion.  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  A micropuncture sheath was introduced over the wire.  Fistulogram was then obtained to evaluate the fistula from the point of cannulation to include the central veins.  I did not compress the fistula to do a reflux shot because of the wound that was at risk for bleeding.  At the completion of 4-0 Monocryl was placed across the cannulation site.  There was good hemostasis. FINDINGS: There is no central venous stenosis. The upper arm fistula is widely patent although it is markedly tortuous and thus would be very difficult to cannulate. CLINICAL NOTE: The she will be transferred to the operating room for exploration of the fistula, possible ligation of the fistula or revision, and placement of a tunneled dialysis catheter.  I have reviewed the indications for the procedure with the patient and the potential complications and she is agreeable to proceed. [name], MD, FACS [facility]   CT Angio Chest PE W and/or Wo Contrast  Result Date: 12/05/2021 CLINICAL DATA:  Dyspnea on exertion. EXAM: CT ANGIOGRAPHY CHEST WITH CONTRAST TECHNIQUE: Multidetector CT imaging of the chest was performed using the standard protocol during bolus administration of intravenous contrast. Multiplanar CT image reconstructions and MIPs were obtained to evaluate the vascular anatomy. RADIATION DOSE REDUCTION: This exam was performed according to the departmental  dose-optimization program which includes automated exposure control, adjustment of the mA and/or kV according to patient size and/or use of iterative reconstruction technique. CONTRAST:  27m OMNIPAQUE IOHEXOL 350 MG/ML SOLN COMPARISON:  January 11, 2021 FINDINGS: Cardiovascular: There is marked severity calcification of the aortic arch, without evidence of aortic aneurysm. Satisfactory opacification of the pulmonary arteries to the segmental level. No evidence of pulmonary embolism. There is mild cardiomegaly. A small pericardial effusion is noted. This represents a new finding  when compared to the prior study. Mediastinum/Nodes: No enlarged mediastinal, hilar, or axillary lymph nodes. Thyroid gland, trachea, and esophagus demonstrate no significant findings. Lungs/Pleura: Diffuse interstitial thickening is noted with bilateral patchy, ill-defined ground-glass appearance of the lung parenchyma. Mild lateral right lower lobe infiltrate is seen. Mild posterior compressive atelectasis is also seen within the bilateral lower lobes. Small to moderate sized bilateral pleural effusions are noted. No pneumothorax is identified. Upper Abdomen: No acute abnormality. Musculoskeletal: No chest wall abnormality. No acute or significant osseous findings. Review of the MIP images confirms the above findings. IMPRESSION: 1. No evidence of pulmonary embolism. 2. Findings consistent with moderate severity pulmonary edema. 3. Mild lateral right lower lobe infiltrate. 4. Small to moderate sized bilateral pleural effusions with mild posterior lower lobe compressive atelectasis. Aortic Atherosclerosis (ICD10-I70.0). Electronically Signed   By: Virgina Norfolk M.D.   On: 12/05/2021 02:08   DG Chest 2 View  Result Date: 12/04/2021 CLINICAL DATA:  Shortness of breath and lethargy. EXAM: CHEST - 2 VIEW COMPARISON:  PA Lat 11/09/2021 FINDINGS: There is mild cardiomegaly. There is interval increased central vascular engorgement and flow  cephalization and increased generalized interstitial edema. There are small pleural effusions. Hazy perihilar infiltrates are noted radiating outward consistent with alveolar edema, less likely pneumonia. Thoracic cage is intact. IMPRESSION: Cardiomegaly with moderate findings of CHF or fluid overload. Small pleural effusions. Perihilar opacities extending outward consistent with alveolar edema, less likely pneumonia. Progress chest films recommended depending on clinical response. Electronically Signed   By: Telford Nab M.D.   On: 12/04/2021 20:41    Microbiology: Results for orders placed or performed during the hospital encounter of 12/19/21  Aerobic/Anaerobic Culture w Gram Stain (surgical/deep wound)     Status: None (Preliminary result)   Collection Time: 12/19/21  3:14 PM   Specimen: PATH Other; Body Fluid  Result Value Ref Range Status   Specimen Description WOUND  Final   Special Requests LEFT AV FISTULA  Final   Gram Stain   Final    NO WBC SEEN NO ORGANISMS SEEN Performed at [facility], [address], [place], [place] [address]    Culture   Final    RARE ENTEROBACTER AEROGENES NO ANAEROBES ISOLATED; CULTURE IN PROGRESS FOR 5 DAYS    Report Status PENDING  Incomplete   Organism ID, Bacteria ENTEROBACTER AEROGENES  Final      Susceptibility   Enterobacter aerogenes - MIC*    CEFAZOLIN >=64 RESISTANT Resistant     CEFEPIME <=0.12 SENSITIVE Sensitive     CEFTAZIDIME <=1 SENSITIVE Sensitive     CEFTRIAXONE <=0.25 SENSITIVE Sensitive     CIPROFLOXACIN <=0.25 SENSITIVE Sensitive     GENTAMICIN <=1 SENSITIVE Sensitive     IMIPENEM 1 SENSITIVE Sensitive     TRIMETH/SULFA <=20 SENSITIVE Sensitive     PIP/TAZO <=4 SENSITIVE Sensitive     * RARE ENTEROBACTER AEROGENES    Labs: CBC: Recent Labs  Lab 12/19/21 0843 12/20/21 0743 12/21/21 0056 12/22/21 0338 12/22/21 0805  WBC  --  8.1 7.0 7.0 6.9  HGB 7.8* 6.9* 8.3* 7.7* 7.2*  HCT 23.0* 22.2* 26.0* 24.9* 24.0*   MCV  --  86.4 84.4 85.9 88.9  PLT  --  258 227 238 948   Basic Metabolic Panel: Recent Labs  Lab 12/19/21 0843 12/20/21 0125 12/21/21 0056 12/22/21 0338 12/22/21 0805  NA 138 137 133* 137 138  K 4.0 5.7* 3.6 4.3 3.8  CL 97* 97* 94* 94* 96*  CO2  --  _0 GLUCOSE 80 119* 100* 102* 95  BUN 41* 53* 30* 49* 49*  CREATININE 15.00* 15.22* 9.43* 11.99* 11.95*  CALCIUM  --  9.3 8.9 9.6 9.0  PHOS  --   --  5.6*  --  6.8*   Liver Function Tests: Recent Labs  Lab 12/22/21 0805  ALBUMIN 2.4*   CBG: No results for input(s): "GLUCAP" in the last 168 hours.  Discharge time spent: greater than 30 minutes.  Signed: Tawni Millers, MD [facility] 12/22/2021

## 2021-12-22 NOTE — Significant Event (Signed)
Rapid Response Event Note   Reason for Call :  Somnolent, chest pain  Initial Focused Assessment:  Patient in HD bay done with treatment, resting in bed. Opens eyes to voice and follows all commands, A&Ox4. Skin warm, dry, 2+ pulses in extremities, lungs clear. Complains of chest pain 8/10, worsens with palpitations.   Patient states this pain happens frequently during HD treatment and resolves without intervention.   VS: 152/71 (95), HR 77, RR 22, O2 100 on 3L [place]  Interventions:  EKG (per Renal MD)  Plan of Care:  Patient taken back to 3E, monitor chest pain. Chest pain improving at time of departure. Call Rapid Response for additional needs.    Event Summary:  MD Notified: Lurline Del Call Time: 4193 Arrival Time: 7902 End Time: 1215  Newman Nickels, RN

## 2021-12-22 NOTE — Progress Notes (Signed)
[facility] Progress Note   Subjective:    Seen and examined patient on HD. She is receiving an extra treatment today. Reports mild SOB. She reports getting portable home O2 at discharge. Tolerating UFG 4L. Plan for discharge after HD today.  Objective Vitals:   12/22/21 0730 12/22/21 0755 12/22/21 0808 12/22/21 0830  BP: (!) 156/101 135/67 127/62 126/75  Pulse: 89  73 79  Resp: 18 16 18 20   Temp:  98.5 F (36.9 C)    TempSrc:  Oral    SpO2: 100% 100% 100% 100%  Weight:      Height:       Physical Exam General: chronically ill appearing female in NAD Heart: RRR, no mrg Lungs: mostly CTAB, nml WOB on 2L O2 Abdomen:soft, NTND Extremities:no LE edema Dialysis Access: TDC, L arm wrapped in ACE wrap; L AVF (+) B/T  Filed Weights   12/20/21 0818 12/21/21 0439 12/22/21 0338  Weight: 89.9 kg 89.6 kg 91.1 kg    Intake/Output Summary (Last 24 hours) at 12/22/2021 0917 Last data filed at 12/21/2021 1700 Gross per 24 hour  Intake 360 ml  Output --  Net 360 ml    Additional Objective Labs: Basic Metabolic Panel: Recent Labs  Lab 12/21/21 0056 12/22/21 0338 12/22/21 0805  NA 133* 137 138  K 3.6 4.3 3.8  CL 94* 94* 96*  CO2 27 29 26   GLUCOSE 100* 102* 95  BUN 30* 49* 49*  CREATININE 9.43* 11.99* 11.95*  CALCIUM 8.9 9.6 9.0  PHOS 5.6*  --  6.8*   Liver Function Tests: Recent Labs  Lab 12/22/21 0805  ALBUMIN 2.4*   No results for input(s): "LIPASE", "AMYLASE" in the last 168 hours. CBC: Recent Labs  Lab 12/20/21 0743 12/21/21 0056 12/22/21 0338 12/22/21 0805  WBC 8.1 7.0 7.0 6.9  HGB 6.9* 8.3* 7.7* 7.2*  HCT 22.2* 26.0* 24.9* 24.0*  MCV 86.4 84.4 85.9 88.9  PLT 258 227 238 230   Blood Culture    Component Value Date/Time   SDES WOUND 12/19/2021 1514   SPECREQUEST LEFT AV FISTULA 12/19/2021 1514   CULT  12/19/2021 1514    RARE ENTEROBACTER AEROGENES NO ANAEROBES ISOLATED; CULTURE IN PROGRESS FOR 5 DAYS    REPTSTATUS PENDING  12/19/2021 1514    Cardiac Enzymes: No results for input(s): "CKTOTAL", "CKMB", "CKMBINDEX", "TROPONINI" in the last 168 hours. CBG: No results for input(s): "GLUCAP" in the last 168 hours. Iron Studies: No results for input(s): "IRON", "TIBC", "TRANSFERRIN", "FERRITIN" in the last 72 hours. Lab Results  Component Value Date   INR 1.2 01/10/2021   INR 1.21 04/12/2018   INR 1.09 03/29/2015   Studies/Results: DG CHEST PORT 1 VIEW  Result Date: 12/21/2021 CLINICAL DATA:  Shortness of breath EXAM: PORTABLE CHEST 1 VIEW COMPARISON:  Chest x-ray October 6th, 2023 FINDINGS: Stable cardiomediastinal contours. Decreased bilateral interstitial pulmonary opacities. Decreased bilateral pleural effusions. No large pneumothorax. No acute osseous abnormality. The visualized upper abdomen is unremarkable. IMPRESSION: Decreased now mild pulmonary edema and small bilateral pleural effusions. Electronically Signed   By: Beryle Flock M.D.   On: 12/21/2021 11:20    Medications:  sodium chloride     albumin human     anticoagulant sodium citrate     ceFEPime (MAXIPIME) IV     [START ON 12/23/2021] ceFEPime (MAXIPIME) IV      sodium chloride   Intravenous Once   aspirin EC  81 mg Oral Daily   calcium acetate  2,001 mg  Oral TID WC   Chlorhexidine Gluconate Cloth  6 each Topical Q0600   cinacalcet  60 mg Oral QPM   heparin  5,000 Units Subcutaneous Q12H   hydrALAZINE  50 mg Oral Q12H   pantoprazole  40 mg Oral Daily   sevelamer carbonate  1,600 mg Oral TID WC   sodium chloride flush  3 mL Intravenous Q12H    Dialysis Orders: 3h 42min   400/600   88kg  2/2 bath   P4   Hep 6600  new TDC (AVF revised, don't use for now) - last HD 10/5, post wt 90kg   - last Hb 7.6 - mircera 225 q2, last 9/26 - hectorol 10 ug tiw IV tts     BP 180/ 80   RA 100% ,now on 4 L [place]    CXR 1 - vasc congestion and early edema   CXR 2 -  worsening edema  Assessment/Plan: Infected AVF - infected skin was excised, L  AVF buttonhole repaired, and new TDC placed 10/06.  On Vanc and zosyn w/HD.  F/u in 2 weeks per vascular.   SOB/ pulm edema - HD 10/8 with net UF~2L. CXR with improved, continues to have mild pulmonary edema.  On HD for extra treatment today w/max UF as tolerated (4L). PMD working to get home O2 set up as previously prescribed as long as continues to have need.  ESRD - on HD TTS. Extra HD today for volume removal.  Anemia esrd - s/p 1 unit pRBC 10/7. Hgb trending down-now 7.2. ESA due Tuesday. Bmd ckd - Calcium at goal and PO4 elevated. Continue VDRA and binders.  BP/vol - not on BP lowering meds at home. BP improving with HD.   Nutrition - Renal diet w/fluid restrictions Dispo - patient receiving extra treatment today. Okay for discharge after treatment today after HD from renal standpoint. Plan to continue HD tomorrow per routine schedule in outpatient. Awaiting home O2 to be delivered.  Tobie Poet, NP [facility] 12/22/2021,9:17 AM  LOS: 1 day

## 2021-12-22 NOTE — Progress Notes (Signed)
  Progress Note    12/22/2021 3:47 PM 3 Days Post-Op  Subjective: No overnight events, dialyzed this morning  Vitals:   12/22/21 1200 12/22/21 1300  BP: (!) 141/70   Pulse: 76   Resp: (!) 21 16  Temp: 98 F (36.7 C)   SpO2: 95%     Physical Exam: Awake alert and oriented Nonlabored respirations Left arm with strong thrill staples in place Right IJ catheter in place  CBC    Component Value Date/Time   WBC 6.9 12/22/2021 0805   RBC 2.70 (L) 12/22/2021 0805   HGB 7.2 (L) 12/22/2021 0805   HCT 24.0 (L) 12/22/2021 0805   PLT 230 12/22/2021 0805   MCV 88.9 12/22/2021 0805   MCH 26.7 12/22/2021 0805   MCHC 30.0 12/22/2021 0805   RDW 15.7 (H) 12/22/2021 0805   LYMPHSABS 1.8 12/04/2021 2150   MONOABS 0.4 12/04/2021 2150   EOSABS 0.3 12/04/2021 2150   BASOSABS 0.0 12/04/2021 2150    BMET    Component Value Date/Time   NA 138 12/22/2021 0805   K 3.8 12/22/2021 0805   CL 96 (L) 12/22/2021 0805   CO2 26 12/22/2021 0805   GLUCOSE 95 12/22/2021 0805   BUN 49 (H) 12/22/2021 0805   CREATININE 11.95 (H) 12/22/2021 0805   CREATININE 19.35 (HH) 08/22/2013 0918   CALCIUM 9.0 12/22/2021 0805   GFRNONAA 4 (L) 12/22/2021 0805   GFRAA 3 (L) 06/12/2019 0139    INR    Component Value Date/Time   INR 1.2 01/10/2021 2323     Intake/Output Summary (Last 24 hours) at 12/22/2021 1547 Last data filed at 12/22/2021 1500 Gross per 24 hour  Intake 680 ml  Output 3700 ml  Net -3020 ml     Assessment:  46 y.o. female is placement of right IJ tunnel catheter and excision of infected skin overlying AV fistula buttonhole.  Plan: Okay for discharge from vascular standpoint we will have her follow-up in 2 weeks to have staples removed and can consider using the fistula again.   Javarious Elsayed C. Donzetta Matters, MD [facility] Office: [phone] Pager: [phone]  12/22/2021 3:47 PM

## 2021-12-23 LAB — GLUCOSE, CAPILLARY: Glucose-Capillary: 78 mg/dL (ref 70–99)

## 2021-12-23 LAB — HEPATITIS B SURFACE ANTIBODY, QUANTITATIVE: Hep B S AB Quant (Post): 48.8 m[IU]/mL (ref >9.9)

## 2021-12-23 NOTE — TOC Transition Note (Signed)
Transition of care contact from inpatient facility  Date of discharge: 12/22/21 Date of contact: 12/23/21 Method: Attempted Phone Call Spoke to: No Answer  Tried calling patient to discuss transition of care from recent inpatient hospitalization but she did not pick up the phone. Unable to leave a voicemail d/t mailbox being full.  Patient received HD today at [facility] (only received 2 hrs tx). Next HD on 12/25/21.  Tobie Poet, NP

## 2021-12-24 LAB — AEROBIC/ANAEROBIC CULTURE W GRAM STAIN (SURGICAL/DEEP WOUND): Gram Stain: NONE SEEN

## 2021-12-31 ENCOUNTER — Encounter: Payer: Self-pay | Admitting: Physician Assistant

## 2021-12-31 ENCOUNTER — Ambulatory Visit: Payer: 59

## 2021-12-31 ENCOUNTER — Ambulatory Visit (INDEPENDENT_AMBULATORY_CARE_PROVIDER_SITE_OTHER): Payer: 59 | Admitting: Physician Assistant

## 2021-12-31 DIAGNOSIS — N186 End stage renal disease: Secondary | ICD-10-CM

## 2021-12-31 NOTE — Progress Notes (Deleted)
POST OPERATIVE OFFICE NOTE    CC:  F/u for surgery  HPI:  This is a 46 y.o. female who is s/p placement of right IJ tunnel catheter and excision of infected skin overlying AV fistula buttonhole on 12/19/21 by Dr. Randie Heinz.  This was performed due to occasional buttonhole bleeding episodes.   Pt returns today for follow up.  Pt states she has no symptoms of steal.  No loss of motor or sensation.     Allergies  Allergen Reactions   Morphine Shortness Of Breath and Anaphylaxis   Prednisone Other (See Comments)    Other reaction(s): Other (See Comments) Muscle spasms Patient says prednisone causes her to cramp all over, muscle spasms uncontrolled   Tuna [Fish Allergy] Itching, Swelling, Rash and Other (See Comments)    Face droops also   Amlodipine Other (See Comments)    Angioedema (09/05/17 ED visit)   Iodinated Contrast Media Itching   Tape Itching    Adhesive tape   paper tape ok    Current Outpatient Medications  Medication Sig Dispense Refill   acetaminophen (TYLENOL) 325 MG tablet Take 2 tablets (650 mg total) by mouth every 4 (four) hours as needed for headache or mild pain.     albuterol (PROVENTIL HFA;VENTOLIN HFA) 108 (90 Base) MCG/ACT inhaler Inhale 2 puffs into the lungs every 4 (four) hours as needed for wheezing or shortness of breath. 1 Inhaler 2   ALPRAZolam (XANAX) 1 MG tablet Take 1 mg by mouth 3 (three) times daily as needed.     aspirin EC 81 MG tablet Take 81 mg by mouth daily. Swallow whole.     B Complex-C-Folic Acid (DIALYVITE 800) 0.8 MG WAFR Take 1 tablet by mouth at bedtime. (Patient not taking: Reported on 12/05/2021)     calcium acetate (PHOSLO) 667 MG capsule Take 667-1,334 mg by mouth See admin instructions. 3 capsules with meals  1 to 2 capsules with snacks  4   cinacalcet (SENSIPAR) 60 MG tablet Take 60 mg by mouth every evening.     cyclobenzaprine (FLEXERIL) 10 MG tablet Take 10 mg by mouth 2 (two) times daily as needed for muscle spasms.      diphenhydrAMINE (BENADRYL) 25 MG tablet Take 1 tablet (25 mg total) by mouth every 6 (six) hours. (Patient taking differently: Take 25 mg by mouth every 6 (six) hours as needed for itching or allergies.) 20 tablet 0   lactulose (CHRONULAC) 10 GM/15ML solution Take 30 mLs by mouth daily as needed for mild constipation, moderate constipation or severe constipation.     NARCAN 4 MG/0.1ML LIQD nasal spray kit Place 1 spray into the nose as needed (accidental overdose).     nystatin-triamcinolone (MYCOLOG II) cream Apply topically 2 (two) times daily.     ondansetron (ZOFRAN-ODT) 8 MG disintegrating tablet Take 8 mg by mouth every 8 (eight) hours as needed for nausea or vomiting.      Oxycodone HCl 10 MG TABS Take 1 tablet (10 mg total) by mouth 3 (three) times daily as needed (severe pain). 5 tablet 0   pantoprazole (PROTONIX) 20 MG tablet Take 20 mg by mouth daily.     sevelamer carbonate (RENVELA) 800 MG tablet Take 1,600 mg by mouth 3 (three) times daily.     SUMAtriptan (IMITREX) 100 MG tablet Take 100 mg by mouth as directed.     No current facility-administered medications for this visit.     ROS:  See HPI  Physical Exam:  ***  Incision:  *** Extremities:  *** Neuro: *** Abdomen:  ***    Assessment/Plan:  This is a 46 y.o. female who is s/p: ***  -***   Roxy Horseman PA-C [facility] [phone]   Clinic MD:  Donzetta Matters

## 2021-12-31 NOTE — Progress Notes (Signed)
POST OPERATIVE OFFICE NOTE    CC:  F/u for surgery  HPI:  This is a 46 y.o. female who is s/p right IJ [facility] placement Excision of infected skin overlying AV fistula and repair of AV fistula buttonhole and  on 12/19/21 by Dr. Donzetta Matters.    Pt returns today for follow up.  Pt states she has no symptoms of steal.  No pain, loss of motor or loss of sensation.  She has HD TTS via right TDC without issues.     Allergies  Allergen Reactions   Morphine Shortness Of Breath and Anaphylaxis   Prednisone Other (See Comments)    Other reaction(s): Other (See Comments) Muscle spasms Patient says prednisone causes her to cramp all over, muscle spasms uncontrolled   Tuna [Fish Allergy] Itching, Swelling, Rash and Other (See Comments)    Face droops also   Amlodipine Other (See Comments)    Angioedema (09/05/17 ED visit)   Iodinated Contrast Media Itching   Tape Itching    Adhesive tape   paper tape ok    Current Outpatient Medications  Medication Sig Dispense Refill   albuterol (PROVENTIL HFA;VENTOLIN HFA) 108 (90 Base) MCG/ACT inhaler Inhale 2 puffs into the lungs every 4 (four) hours as needed for wheezing or shortness of breath. 1 Inhaler 2   ALPRAZolam (XANAX) 1 MG tablet Take 1 mg by mouth 3 (three) times daily as needed.     aspirin EC 81 MG tablet Take 81 mg by mouth daily. Swallow whole.     calcium acetate (PHOSLO) 667 MG capsule Take 667-1,334 mg by mouth See admin instructions. 3 capsules with meals  1 to 2 capsules with snacks  4   cinacalcet (SENSIPAR) 60 MG tablet Take 60 mg by mouth every evening.     cyclobenzaprine (FLEXERIL) 10 MG tablet Take 10 mg by mouth 2 (two) times daily as needed for muscle spasms.     diphenhydrAMINE (BENADRYL) 25 MG tablet Take 1 tablet (25 mg total) by mouth every 6 (six) hours. (Patient taking differently: Take 25 mg by mouth every 6 (six) hours as needed for itching or allergies.) 20 tablet 0   lactulose (CHRONULAC) 10 GM/15ML solution Take 30 mLs by  mouth daily as needed for mild constipation, moderate constipation or severe constipation.     NARCAN 4 MG/0.1ML LIQD nasal spray kit Place 1 spray into the nose as needed (accidental overdose).     nystatin-triamcinolone (MYCOLOG II) cream Apply topically 2 (two) times daily.     ondansetron (ZOFRAN-ODT) 8 MG disintegrating tablet Take 8 mg by mouth every 8 (eight) hours as needed for nausea or vomiting.      Oxycodone HCl 10 MG TABS Take 1 tablet (10 mg total) by mouth 3 (three) times daily as needed (severe pain). 5 tablet 0   pantoprazole (PROTONIX) 20 MG tablet Take 20 mg by mouth daily.     sevelamer carbonate (RENVELA) 800 MG tablet Take 1,600 mg by mouth 3 (three) times daily.     SUMAtriptan (IMITREX) 100 MG tablet Take 100 mg by mouth as directed.     No current facility-administered medications for this visit.     ROS:  See HPI  Physical Exam:     Incision:  well healed without erythema or edema Extremities:  grip 5/5, sensation intact and equal B UE Lungs non labored breathing     Assessment/Plan:  This is a 46 y.o. female who is s/p: Revision left UE AV fistula with history  of infected skin over button hole  Final cultures: ENTEROBACTER AEROGENES managed on cefepime IV on HD.  IV antibiotics  7 days  have been completed.   Staples were removed today she tolerated this well.  The fistula may be used by 01/19/22.  Once it is working well the [facility] may be discontinued.  F/U PRN.    Roxy Horseman PA-C [facility] [phone]   Clinic MD:  Donzetta Matters

## 2022-01-05 ENCOUNTER — Ambulatory Visit (INDEPENDENT_AMBULATORY_CARE_PROVIDER_SITE_OTHER): Payer: 59

## 2022-01-05 DIAGNOSIS — R7981 Abnormal blood-gas level: Secondary | ICD-10-CM

## 2022-01-05 DIAGNOSIS — R0602 Shortness of breath: Secondary | ICD-10-CM | POA: Diagnosis not present

## 2022-01-08 LAB — ECHOCARDIOGRAM COMPLETE
Area-P 1/2: 4.39 cm2
MV M vel: 4.92 m/s
MV Peak grad: 96.8 mmHg
Radius: 0.5 cm
S' Lateral: 3.59 cm

## 2022-02-03 ENCOUNTER — Encounter (HOSPITAL_COMMUNITY): Payer: Self-pay | Admitting: Emergency Medicine

## 2022-02-03 ENCOUNTER — Emergency Department (HOSPITAL_COMMUNITY): Payer: 59

## 2022-02-03 ENCOUNTER — Observation Stay (HOSPITAL_COMMUNITY)
Admission: EM | Admit: 2022-02-03 | Discharge: 2022-02-04 | Disposition: A | Discharge status: Home / Self Care | Payer: 59 | Attending: Internal Medicine | Admitting: Internal Medicine

## 2022-02-03 DIAGNOSIS — J45909 Unspecified asthma, uncomplicated: Secondary | ICD-10-CM | POA: Insufficient documentation

## 2022-02-03 DIAGNOSIS — Z1152 Encounter for screening for COVID-19: Secondary | ICD-10-CM | POA: Insufficient documentation

## 2022-02-03 DIAGNOSIS — I10 Essential (primary) hypertension: Secondary | ICD-10-CM | POA: Insufficient documentation

## 2022-02-03 DIAGNOSIS — K5730 Diverticulosis of large intestine without perforation or abscess without bleeding: Secondary | ICD-10-CM | POA: Diagnosis not present

## 2022-02-03 DIAGNOSIS — J13 Pneumonia due to Streptococcus pneumoniae: Principal | ICD-10-CM | POA: Insufficient documentation

## 2022-02-03 DIAGNOSIS — F419 Anxiety disorder, unspecified: Secondary | ICD-10-CM | POA: Diagnosis not present

## 2022-02-03 DIAGNOSIS — Z9981 Dependence on supplemental oxygen: Secondary | ICD-10-CM | POA: Diagnosis not present

## 2022-02-03 DIAGNOSIS — I509 Heart failure, unspecified: Secondary | ICD-10-CM | POA: Diagnosis not present

## 2022-02-03 DIAGNOSIS — J189 Pneumonia, unspecified organism: Secondary | ICD-10-CM

## 2022-02-03 DIAGNOSIS — Z7722 Contact with and (suspected) exposure to environmental tobacco smoke (acute) (chronic): Secondary | ICD-10-CM | POA: Diagnosis not present

## 2022-02-03 DIAGNOSIS — Z79899 Other long term (current) drug therapy: Secondary | ICD-10-CM | POA: Diagnosis not present

## 2022-02-03 DIAGNOSIS — N186 End stage renal disease: Secondary | ICD-10-CM

## 2022-02-03 DIAGNOSIS — J810 Acute pulmonary edema: Secondary | ICD-10-CM | POA: Diagnosis not present

## 2022-02-03 DIAGNOSIS — R9431 Abnormal electrocardiogram [ECG] [EKG]: Secondary | ICD-10-CM | POA: Diagnosis not present

## 2022-02-03 DIAGNOSIS — F32A Depression, unspecified: Secondary | ICD-10-CM

## 2022-02-03 DIAGNOSIS — Z7982 Long term (current) use of aspirin: Secondary | ICD-10-CM | POA: Diagnosis not present

## 2022-02-03 DIAGNOSIS — E8770 Fluid overload, unspecified: Secondary | ICD-10-CM | POA: Diagnosis not present

## 2022-02-03 DIAGNOSIS — J9621 Acute and chronic respiratory failure with hypoxia: Secondary | ICD-10-CM | POA: Diagnosis not present

## 2022-02-03 DIAGNOSIS — I5022 Chronic systolic (congestive) heart failure: Secondary | ICD-10-CM

## 2022-02-03 DIAGNOSIS — M109 Gout, unspecified: Secondary | ICD-10-CM | POA: Insufficient documentation

## 2022-02-03 DIAGNOSIS — J811 Chronic pulmonary edema: Secondary | ICD-10-CM | POA: Diagnosis present

## 2022-02-03 DIAGNOSIS — I132 Hypertensive heart and chronic kidney disease with heart failure and with stage 5 chronic kidney disease, or end stage renal disease: Secondary | ICD-10-CM | POA: Insufficient documentation

## 2022-02-03 DIAGNOSIS — Z992 Dependence on renal dialysis: Secondary | ICD-10-CM

## 2022-02-03 DIAGNOSIS — J9611 Chronic respiratory failure with hypoxia: Secondary | ICD-10-CM

## 2022-02-03 DIAGNOSIS — R0602 Shortness of breath: Secondary | ICD-10-CM | POA: Diagnosis present

## 2022-02-03 LAB — COMPREHENSIVE METABOLIC PANEL
ALT: 40 U/L (ref 0–44)
AST: 24 U/L (ref 15–41)
Albumin: 3.2 g/dL — ABNORMAL LOW (ref 3.5–5.0)
Alkaline Phosphatase: 47 U/L (ref 38–126)
Anion gap: 18 — ABNORMAL HIGH (ref 5–15)
BUN: 90 mg/dL — ABNORMAL HIGH (ref 6–20)
CO2: 24 mmol/L (ref 22–32)
Calcium: 9.2 mg/dL (ref 8.9–10.3)
Chloride: 101 mmol/L (ref 98–111)
Creatinine, Ser: 14.01 mg/dL — ABNORMAL HIGH (ref 0.44–1.00)
GFR, Estimated: 3 mL/min — ABNORMAL LOW (ref >60)
Glucose, Bld: 102 mg/dL — ABNORMAL HIGH (ref 70–99)
Potassium: 4.6 mmol/L (ref 3.5–5.1)
Sodium: 143 mmol/L (ref 135–145)
Total Bilirubin: 0.5 mg/dL (ref 0.3–1.2)
Total Protein: 7.1 g/dL (ref 6.5–8.1)

## 2022-02-03 LAB — CBC WITH DIFFERENTIAL/PLATELET
Abs Immature Granulocytes: 0.01 10*3/uL (ref 0.00–0.07)
Basophils Absolute: 0 10*3/uL (ref 0.0–0.1)
Basophils Relative: 1 %
Eosinophils Absolute: 0.2 10*3/uL (ref 0.0–0.5)
Eosinophils Relative: 3 %
HCT: 29.7 % — ABNORMAL LOW (ref 36.0–46.0)
Hemoglobin: 9.1 g/dL — ABNORMAL LOW (ref 12.0–15.0)
Immature Granulocytes: 0 %
Lymphocytes Relative: 16 %
Lymphs Abs: 1 10*3/uL (ref 0.7–4.0)
MCH: 25.7 pg — ABNORMAL LOW (ref 26.0–34.0)
MCHC: 30.6 g/dL (ref 30.0–36.0)
MCV: 83.9 fL (ref 80.0–100.0)
Monocytes Absolute: 0.4 10*3/uL (ref 0.1–1.0)
Monocytes Relative: 6 %
Neutro Abs: 4.9 10*3/uL (ref 1.7–7.7)
Neutrophils Relative %: 74 %
Platelets: 180 10*3/uL (ref 150–400)
RBC: 3.54 MIL/uL — ABNORMAL LOW (ref 3.87–5.11)
RDW: 18.1 % — ABNORMAL HIGH (ref 11.5–15.5)
WBC: 6.5 10*3/uL (ref 4.0–10.5)
nRBC: 0 % (ref 0.0–0.2)

## 2022-02-03 LAB — BRAIN NATRIURETIC PEPTIDE: B Natriuretic Peptide: 518.9 pg/mL — ABNORMAL HIGH (ref 0.0–100.0)

## 2022-02-03 LAB — I-STAT VENOUS BLOOD GAS, ED
Acid-Base Excess: 0 mmol/L (ref 0.0–2.0)
Bicarbonate: 25.3 mmol/L (ref 20.0–28.0)
Calcium, Ion: 1.05 mmol/L — ABNORMAL LOW (ref 1.15–1.40)
HCT: 29 % — ABNORMAL LOW (ref 36.0–46.0)
Hemoglobin: 9.9 g/dL — ABNORMAL LOW (ref 12.0–15.0)
O2 Saturation: 78 %
Potassium: 5.4 mmol/L — ABNORMAL HIGH (ref 3.5–5.1)
Sodium: 139 mmol/L (ref 135–145)
TCO2: 27 mmol/L (ref 22–32)
pCO2, Ven: 40.9 mmHg — ABNORMAL LOW (ref 44–60)
pH, Ven: 7.4 (ref 7.25–7.43)
pO2, Ven: 43 mmHg (ref 32–45)

## 2022-02-03 LAB — RESP PANEL BY RT-PCR (FLU A&B, COVID) ARPGX2
Influenza A by PCR: NEGATIVE
Influenza B by PCR: NEGATIVE
SARS Coronavirus 2 by RT PCR: NEGATIVE

## 2022-02-03 LAB — I-STAT BETA HCG BLOOD, ED (MC, WL, AP ONLY): I-stat hCG, quantitative: 7.4 m[IU]/mL — ABNORMAL HIGH (ref <5)

## 2022-02-03 LAB — LIPASE, BLOOD: Lipase: 62 U/L — ABNORMAL HIGH (ref 11–51)

## 2022-02-03 LAB — HCG, SERUM, QUALITATIVE: Preg, Serum: NEGATIVE

## 2022-02-03 LAB — TROPONIN I (HIGH SENSITIVITY): Troponin I (High Sensitivity): 14 ng/L (ref <18)

## 2022-02-03 MED ORDER — CINACALCET HCL 30 MG PO TABS
60.0000 mg | ORAL_TABLET | Freq: Every evening | ORAL | Status: DC
  Administered 2022-02-04: 60 mg via ORAL
  Filled 2022-02-03 (×2): qty 2

## 2022-02-03 MED ORDER — CALCIUM ACETATE (PHOS BINDER) 667 MG PO CAPS: 667.0000 mg | ORAL_CAPSULE | ORAL | Status: DC | PRN

## 2022-02-03 MED ORDER — ALPRAZOLAM 0.25 MG PO TABS
1.0000 mg | ORAL_TABLET | Freq: Once | ORAL | Status: AC
  Administered 2022-02-03: 1 mg via ORAL
  Filled 2022-02-03: qty 4

## 2022-02-03 MED ORDER — SODIUM CHLORIDE 0.9 % IV SOLN: 2.0000 g | INTRAVENOUS | Status: DC

## 2022-02-03 MED ORDER — HEPARIN SODIUM (PORCINE) 5000 UNIT/ML IJ SOLN
5000.0000 [IU] | Freq: Three times a day (TID) | INTRAMUSCULAR | Status: DC
  Administered 2022-02-04: 5000 [IU] via SUBCUTANEOUS
  Filled 2022-02-03: qty 1

## 2022-02-03 MED ORDER — OXYCODONE HCL 5 MG PO TABS
10.0000 mg | ORAL_TABLET | Freq: Once | ORAL | Status: AC
  Administered 2022-02-03: 10 mg via ORAL
  Filled 2022-02-03: qty 2

## 2022-02-03 MED ORDER — SODIUM CHLORIDE 0.9 % IV SOLN
1.0000 g | Freq: Once | INTRAVENOUS | Status: AC
  Administered 2022-02-03: 1 g via INTRAVENOUS
  Filled 2022-02-03: qty 10

## 2022-02-03 MED ORDER — SODIUM CHLORIDE 0.9 % IV SOLN: 500.0000 mg | INTRAVENOUS | Status: DC

## 2022-02-03 MED ORDER — SEVELAMER CARBONATE 800 MG PO TABS
1600.0000 mg | ORAL_TABLET | Freq: Three times a day (TID) | ORAL | Status: DC
  Administered 2022-02-04: 1600 mg via ORAL
  Filled 2022-02-03: qty 2

## 2022-02-03 MED ORDER — SODIUM CHLORIDE 0.9 % IV SOLN
500.0000 mg | Freq: Once | INTRAVENOUS | Status: AC
  Administered 2022-02-03: 500 mg via INTRAVENOUS
  Filled 2022-02-03: qty 5

## 2022-02-03 MED ORDER — CALCIUM ACETATE (PHOS BINDER) 667 MG PO CAPS
2001.0000 mg | ORAL_CAPSULE | Freq: Three times a day (TID) | ORAL | Status: DC
  Administered 2022-02-04 (×2): 2001 mg via ORAL
  Filled 2022-02-03 (×2): qty 3

## 2022-02-03 NOTE — ED Notes (Signed)
The patient reports she received her last Dialysis on Saturday. She usually goes Tu/Thur/Sat. Due to the holidays her dialysis was scheduled for Monday this week instead of her usual Tuesday. She reports she forgot and wasn't thinking about going on a Monday so she ended up missing her appt yesterday. She reports SOB "I feel like I'm drowning" and reports swelling to legs, worse on left.

## 2022-02-03 NOTE — ED Provider Notes (Signed)
[facility] EMERGENCY DEPARTMENT Provider Note   CSN: [id number] Arrival date & time: 02/03/22  1306     History  Chief Complaint  Patient presents with   Shortness of Breath   Diarrhea    Desiree [name] is a 46 y.o. female.  Patient as above with significant medical history as below, including ESRD on HD T/TH/Sa, last HD sat, missed HD yesterday due to holiday schedule at dialysis center, anxiety, GERD,  who presents to the ED with complaint of multiple complaints, feels like she is fluid overloaded, having cough that is nonproductive last 3 days, exertional dib and orthopnea last 24 hours. No fevers or chills. Also having liquid stool last 3 days, poor appetite. No sick contacts or suspicious PO that she is aware of. No n/v. Having some abd cramping, chest tightness, leg swelling that seems worse than normal. Orthopnea, exertional dib. Does not produce urine any longer. HD access is chest catheter, has fistula to LUE that is not working per the pt.      Past Medical History:  Diagnosis Date   Anemia    Anxiety    panic attacks   Arthritis    bilateral knees   Childhood asthma    Complication of anesthesia    "sometimes it does not work; didn't during LEEP OR" (01/21/2016)   Depression    no med   ESRD (end stage renal disease) on dialysis ([place])    "TTS; [facility]; [facility]" (08/03/2016)   GERD (gastroesophageal reflux disease)    nexium prn   Gout    History of blood transfusion    "related to kidneys; I've had 4" (01/21/2016)   Hypertension    Migraine    last one 01/18/19   Preterm labor ~ 2014   Retina hole, left    Seizures ([place])    "last one was in 2000; related to preeclampsia" (01/21/2016)    Past Surgical History:  Procedure Laterality Date   A/V FISTULAGRAM Left 12/19/2021   Procedure: A/V Fistulagram;  Surgeon: Angelia Mould, MD;  Location: [facility];  Service: Cardiovascular;  Laterality: Left;   AV FISTULA  PLACEMENT Left 2010   AV FISTULA PLACEMENT Left 01/20/2019   Procedure: ARTERIOVENOUS (AV) FISTULA CREATION LEFT UPPER ARM;  Surgeon: Angelia Mould, MD;  Location: [facility];  Service: Vascular;  Laterality: Left;   AV FISTULA PLACEMENT Left 12/19/2021   Procedure: ARTERIOVENOUS (AV) FISTULA CREATION POSSIBLE TUNNELED DIALYSIS;  Surgeon: Waynetta Sandy, MD;  Location: [facility];  Service: Vascular;  Laterality: Left;   CERVICAL BIOPSY  W/ LOOP ELECTRODE EXCISION  2001   DILATION AND EVACUATION  08/02/2011   Procedure: DILATATION AND EVACUATION;  Surgeon: Logan Bores, MD;  Location: [facility];  Service: Gynecology;;   DILATION AND EVACUATION N/A 08/31/2013   Procedure: DILATATION AND EVACUATION;  Surgeon: Woodroe Mode, MD;  Location: [facility];  Service: Gynecology;  Laterality: N/A;   FISTULA SUPERFICIALIZATION Left 01/20/2019   Procedure: Fistula Superficialization;  Surgeon: Angelia Mould, MD;  Location: [facility];  Service: Vascular;  Laterality: Left;   HYDRADENITIS EXCISION Right    INSERTION OF DIALYSIS CATHETER Right 12/19/2021   Procedure: INSERTION OF A TUNNELED DIALYSIS CATHETER;  Surgeon: Waynetta Sandy, MD;  Location: [facility];  Service: Vascular;  Laterality: Right;   RENAL BIOPSY     REVISION OF ARTERIOVENOUS GORETEX GRAFT Left 02/11/2013   Procedure: REVISION OF ARTERIOVENOUS GORTEX FISTULA;  Surgeon: Arvilla Meres  Early, MD;  Location: [facility];  Service: Vascular;  Laterality: Left;   THROMBECTOMY W/ EMBOLECTOMY Left 05/25/2018   Procedure: REPAIR OF BLEEDING ARTERIOVENOUS FISTULA;  Surgeon: Serafina Mitchell, MD;  Location: [facility];  Service: Vascular;  Laterality: Left;     The history is provided by the patient. No language interpreter was used.  Shortness of Breath Associated symptoms: abdominal pain and cough   Associated symptoms: no chest pain, no fever, no headaches and no rash   Diarrhea Associated symptoms: abdominal pain   Associated symptoms: no fever  and no headaches        Home Medications Prior to Admission medications   Medication Sig Start Date End Date Taking? Authorizing Provider  albuterol (PROVENTIL HFA;VENTOLIN HFA) 108 (90 Base) MCG/ACT inhaler Inhale 2 puffs into the lungs every 4 (four) hours as needed for wheezing or shortness of breath. 05/02/16   Forde Dandy, MD  ALPRAZolam Duanne Moron) 1 MG tablet Take 1 mg by mouth 3 (three) times daily as needed. 11/25/21   [provider]  aspirin EC 81 MG tablet Take 81 mg by mouth daily. Swallow whole.    [provider]  calcium acetate (PHOSLO) 667 MG capsule Take 667-1,334 mg by mouth See admin instructions. 3 capsules with meals  1 to 2 capsules with snacks 11/30/17   [provider]  cinacalcet (SENSIPAR) 60 MG tablet Take 60 mg by mouth every evening.    [provider]  cyclobenzaprine (FLEXERIL) 10 MG tablet Take 10 mg by mouth 2 (two) times daily as needed for muscle spasms.    [provider]  diphenhydrAMINE (BENADRYL) 25 MG tablet Take 1 tablet (25 mg total) by mouth every 6 (six) hours. Patient taking differently: Take 25 mg by mouth every 6 (six) hours as needed for itching or allergies. 09/05/17   Domenic Moras, PA-C  lactulose (CHRONULAC) 10 GM/15ML solution Take 30 mLs by mouth daily as needed for mild constipation, moderate constipation or severe constipation. 09/04/21   [provider]  NARCAN 4 MG/0.1ML LIQD nasal spray kit Place 1 spray into the nose as needed (accidental overdose). 11/14/18   [provider]  nystatin-triamcinolone (MYCOLOG II) cream Apply topically 2 (two) times daily. 09/09/21   [provider]  ondansetron (ZOFRAN-ODT) 8 MG disintegrating tablet Take 8 mg by mouth every 8 (eight) hours as needed for nausea or vomiting.     [provider]  Oxycodone HCl 10 MG TABS Take 1 tablet (10 mg total) by mouth 3 (three) times daily as needed (severe pain). 12/22/21   Arrien, Jimmy Picket, MD  pantoprazole (PROTONIX) 20 MG tablet Take 20 mg by mouth daily. 11/25/21   [provider]  sevelamer carbonate (RENVELA) 800 MG tablet Take 1,600 mg by mouth 3 (three) times daily. 05/18/19   [provider]  SUMAtriptan (IMITREX) 100 MG tablet Take 100 mg by mouth as directed. 09/23/21   [provider]      Allergies    Morphine, Prednisone, Blain Pais allergy], Amlodipine, Iodinated contrast media, and Tape    Review of Systems   Review of Systems  Constitutional:  Positive for appetite change and fatigue. Negative for activity change and fever.  HENT:  Negative for facial swelling and trouble swallowing.   Eyes:  Negative for discharge and redness.  Respiratory:  Positive for cough and shortness of breath.   Cardiovascular:  Positive for leg swelling. Negative for chest pain and palpitations.  Gastrointestinal:  Positive for abdominal pain and diarrhea. Negative for nausea.  Genitourinary:  Negative for dysuria and flank pain.  Musculoskeletal:  Negative for back pain and gait problem.  Skin:  Negative for pallor and rash.  Neurological:  Negative for syncope and headaches.    Physical Exam Updated Vital Signs BP (!) 166/81   Pulse 82   Temp 98.6 F (37 C) (Oral)   Resp (!) 21   Ht 5' 5" (1.651 m)   Wt 87 kg   SpO2 100%   BMI 31.92 kg/m  Physical Exam Vitals and nursing note reviewed. Exam conducted with a chaperone present.  Constitutional:      General: She is not in acute distress.    Appearance: Normal appearance. She is obese.  HENT:     Head: Normocephalic and atraumatic.     Right Ear: External ear normal.     Left Ear: External ear normal.     Nose: Nose normal.     Mouth/Throat:     Mouth: Mucous membranes are dry.  Eyes:     General: No scleral icterus.       Right eye: No discharge.        Left eye: No discharge.  Cardiovascular:     Rate and Rhythm: Normal rate and regular rhythm.     Pulses: Normal pulses.      Heart sounds: Normal heart sounds.  Pulmonary:     Effort: Pulmonary effort is normal. No respiratory distress.     Breath sounds: Decreased breath sounds and wheezing present.  Abdominal:     General: Abdomen is flat.     Palpations: Abdomen is soft.     Tenderness: There is generalized abdominal tenderness.  Musculoskeletal:        General: Normal range of motion.     Cervical back: Normal range of motion.     Right lower leg: Edema present.     Left lower leg: Edema present.  Skin:    General: Skin is warm and dry.     Capillary Refill: Capillary refill takes less than 2 seconds.       Neurological:     Mental Status: She is alert and oriented to person, place, and time.     GCS: GCS eye subscore is 4. GCS verbal subscore is 5. GCS motor subscore is 6.  Psychiatric:        Mood and Affect: Mood normal.        Behavior: Behavior normal.     ED Results / Procedures / Treatments   Labs (all labs ordered are listed, but only abnormal results are displayed) Labs Reviewed  CBC WITH DIFFERENTIAL/PLATELET - Abnormal; Notable for the following components:      Result Value   RBC 3.54 (*)    Hemoglobin 9.1 (*)    HCT 29.7 (*)    MCH 25.7 (*)    RDW 18.1 (*)    All other components within normal limits  BRAIN NATRIURETIC PEPTIDE - Abnormal; Notable for the following components:   B Natriuretic Peptide 518.9 (*)    All other components within normal limits  LIPASE, BLOOD - Abnormal; Notable for the following components:   Lipase 62 (*)    All other components within normal limits  COMPREHENSIVE METABOLIC PANEL - Abnormal; Notable for the following components:   Glucose, Bld 102 (*)    BUN 90 (*)    Creatinine, Ser 14.01 (*)    Albumin 3.2 (*)  GFR, Estimated 3 (*)    Anion gap 18 (*)    All other components within normal limits  I-STAT VENOUS BLOOD GAS, ED - Abnormal; Notable for the following components:   pCO2, Ven 40.9 (*)    Potassium 5.4 (*)    Calcium, Ion 1.05  (*)    HCT 29.0 (*)    Hemoglobin 9.9 (*)    All other components within normal limits  I-STAT BETA HCG BLOOD, ED ([facility], [facility], [facility] ONLY) - Abnormal; Notable for the following components:   I-stat hCG, quantitative 7.4 (*)    All other components within normal limits  RESP PANEL BY RT-PCR (FLU A&B, COVID) ARPGX2  CULTURE, BLOOD (ROUTINE X 2)  CULTURE, BLOOD (ROUTINE X 2)  HCG, SERUM, QUALITATIVE  URINALYSIS, ROUTINE W REFLEX MICROSCOPIC  LEGIONELLA PNEUMOPHILA SEROGP 1 UR AG  CBC  CREATININE, SERUM  CBC WITH DIFFERENTIAL/PLATELET  LEGIONELLA PNEUMOPHILA SEROGP 1 UR AG  STREP PNEUMONIAE URINARY ANTIGEN  TROPONIN I (HIGH SENSITIVITY)    EKG EKG Interpretation  Date/Time:  Tuesday February 03 2022 13:25:07 EST Ventricular Rate:  79 PR Interval:  112 QRS Duration: 66 QT Interval:  400 QTC Calculation: 458 R Axis:   68 Text Interpretation: Normal sinus rhythm Abnormal ECG When compared with ECG of 22-Dec-2021 11:50, PREVIOUS ECG IS PRESENT similar to previous no stemi Confirmed by Wynona Dove ([id number]) on 02/03/2022 6:05:15 PM  Radiology CT CHEST ABDOMEN PELVIS WO CONTRAST  Result Date: 02/03/2022 CLINICAL DATA:  Nonspecific chest pain. Shortness of breath. Diarrhea. EXAM: CT CHEST, ABDOMEN AND PELVIS WITHOUT CONTRAST TECHNIQUE: Multidetector CT imaging of the chest, abdomen and pelvis was performed following the standard protocol without IV contrast. RADIATION DOSE REDUCTION: This exam was performed according to the departmental dose-optimization program which includes automated exposure control, adjustment of the mA and/or kV according to patient size and/or use of iterative reconstruction technique. COMPARISON:  Radiograph earlier today. Chest CTA 12/05/2021, abdominopelvic CT 02/15/2021 FINDINGS: CT CHEST FINDINGS Cardiovascular: Right-sided dialysis catheter in place. Heart is upper normal in size. There is a small circumferential pericardial effusion. Mild mitral annulus calcifications.  Age advanced aortic atherosclerosis. Mediastinum/Nodes: No bulky adenopathy, lack of contrast and motion limits detailed assessment. Patulous esophagus. No thyroid nodule. Lungs/Pleura: There are small bilateral pleural effusions, slightly diminished from prior CT. Mild diffuse ground-glass opacity in septal thickening typical of pulmonary edema. There is also bronchial thickening which may be congestive. Airspace disease in the lingula with air bronchograms. Additional airspace disease in the left lower and central right lower lobes. Mild bandlike atelectasis in the right middle lobe. Musculoskeletal: There are no acute or suspicious osseous abnormalities. CT ABDOMEN PELVIS FINDINGS Hepatobiliary: No focal hepatic abnormality on this unenhanced exam. Possible layering sludge in the gallbladder. Obvious pericholecystic inflammation. No biliary dilatation. Pancreas: Grossly negative. Spleen: Unremarkable noncontrast appearance. Adrenals/Urinary Tract: No adrenal nodule. Chronic bilateral renal parenchymal atrophy. Small cysts in the right kidney. No specific imaging follow-up is recommended. Peripherally calcified cyst in the left kidney measures 8.4 cm, similar to prior. No follow-up imaging is recommended. Nondistended urinary bladder. Stomach/Bowel: Elongated air-filled appendix courses into the right upper quadrant lateral to the liver. There is scattered colonic diverticula without diverticulitis. No colonic inflammation. No small bowel obstruction. Vascular/Lymphatic: Mild aortic atherosclerosis. No aneurysm. No bulky abdominopelvic adenopathy. Reproductive: Unremarkable appearance of the uterus. Decreased size of cystic changes in the right ovary. No suspicious adnexal mass. Other: Small amount of ascites in the mesentery, pelvis and right upper quadrant. No free  air. No focal fluid collection. Generalized body wall edema. Musculoskeletal: There are no acute or suspicious osseous abnormalities. IMPRESSION: 1.  Multifocal airspace disease suspicious for pneumonia, most prominent in the lingula. 2. Small bilateral pleural effusions, slightly diminished from prior CT. Mild pulmonary edema. 3. Small amount of ascites in the mesentery, pelvis and right upper quadrant. Generalized body wall edema. 4. Colonic diverticulosis without diverticulitis. Aortic Atherosclerosis (ICD10-I70.0). Electronically Signed   By: Keith Rake M.D.   On: 02/03/2022 21:25   DG Chest 2 View  Result Date: 02/03/2022 CLINICAL DATA:  Chest pain and short of breath.  Dialysis patient EXAM: CHEST - 2 VIEW COMPARISON:  Chest 12/21/2021 FINDINGS: Progression of bilateral airspace disease compatible with pulmonary edema. No significant pleural effusion. Central venous catheter tip in the right atrium unchanged. IMPRESSION: Progression of bilateral airspace disease compatible with pulmonary edema. Electronically Signed   By: Franchot Gallo M.D.   On: 02/03/2022 14:35    Procedures Ultrasound ED Peripheral IV (Provider)  Date/Time: 02/03/2022 6:05 PM  Performed by: Jeanell Sparrow, DO Authorized by: Jeanell Sparrow, DO   Procedure details:    Indications: multiple failed IV attempts and poor IV access     Skin Prep: chlorhexidine gluconate     Location:  Right forearm   Angiocath:  20 G   Bedside Ultrasound Guided: Yes     Images: not archived     Patient tolerated procedure without complications: Yes     Dressing applied: Yes       Medications Ordered in ED Medications  azithromycin (ZITHROMAX) 500 mg in sodium chloride 0.9 % 250 mL IVPB (500 mg Intravenous New Bag/Given 02/03/22 2221)  calcium acetate (PHOSLO) capsule 667-1,334 mg (has no administration in time range)  cinacalcet (SENSIPAR) tablet 60 mg (has no administration in time range)  sevelamer carbonate (RENVELA) tablet 1,600 mg (has no administration in time range)  heparin injection 5,000 Units (has no administration in time range)  azithromycin (ZITHROMAX) 500  mg in sodium chloride 0.9 % 250 mL IVPB (has no administration in time range)  cefTRIAXone (ROCEPHIN) 2 g in sodium chloride 0.9 % 100 mL IVPB (has no administration in time range)  oxyCODONE (Oxy IR/ROXICODONE) immediate release tablet 10 mg (10 mg Oral Given 02/03/22 1817)  ALPRAZolam (XANAX) tablet 1 mg (1 mg Oral Given 02/03/22 1817)  cefTRIAXone (ROCEPHIN) 1 g in sodium chloride 0.9 % 100 mL IVPB (0 g Intravenous Stopped 02/03/22 2224)    ED Course/ Medical Decision Making/ A&P Clinical Course as of 02/03/22 2314  Tue Feb 03, 2022  2157 Spoke w/ Dr Royce Macadamia, will have day team see her tomorrow for HD needs [SG]    Clinical Course User Index [SG] Jeanell Sparrow, DO                           Medical Decision Making Amount and/or Complexity of Data Reviewed Labs: ordered. Radiology: ordered.  Risk Prescription drug management. Decision regarding hospitalization.   This patient presents to the ED with chief complaint(s) of dib, abd pain, diarrhea, volume status with pertinent past medical history of above ESRD with missed HD yestd which further complicates the presenting complaint. The complaint involves an extensive differential diagnosis and also carries with it a high risk of complications and morbidity.    The differential diagnosis includes but not limited to   In my evaluation of this patient's dyspnea my DDx includes, but is not limited  to, pneumonia, pulmonary embolism, pneumothorax, pulmonary edema, metabolic acidosis, asthma, COPD, cardiac cause, anemia, anxiety, etc.   Differential includes all life-threatening causes for chest pain. This includes but is not exclusive to acute coronary syndrome, aortic dissection, pulmonary embolism, cardiac tamponade, community-acquired pneumonia, pericarditis, musculoskeletal chest wall pain, etc.  Differential diagnosis includes but is not exclusive to acute cholecystitis, intrathoracic causes for epigastric abdominal pain, gastritis,  duodenitis, pancreatitis, small bowel or large bowel obstruction, abdominal aortic aneurysm, hernia, gastritis, etc.  . Serious etiologies were considered.   The initial plan is to screening labs/ xr   Additional history obtained: Additional history obtained from  na Records reviewed previous admission documents and prior ED visits, home meds, prior labs/imaging  She was admitted 10/6 dc 10/9 following volume overlaod in setting of fistula malfunction. Tunneled IJ right was placed during that admission. Fistulogram 10/6 Dr Scot Dock with patent fistula although tortuous  "thus would be very difficult to cannulate. "  Independent labs interpretation:  The following labs were independently interpreted:  Metabolic panel similar to prior given hx CKD, K is 4.6, bicarb is 24 WBC is not elevated, HGB is low, similar to prior; likely anemia chronic dz BNP is elevated, not unexpected given CKD VBG stable  Independent visualization of imaging: - I independently visualized the following imaging with scope of interpretation limited to determining acute life threatening conditions related to emergency care: CT CAP, which revealed lingular PNA, pleural effusion  Cardiac monitoring was reviewed and interpreted by myself which shows NSR  Treatment and Reassessment: Rocephin/azithro Home oxy/xanax 3L [place] >> improved  Consultation: - Consulted or discussed management/test interpretation w/ external professional: Dr Kirke Corin  Consideration for admission or further workup: Admission was considered   PSI is 105, She is on her typical 3L [place], she has worsened exertional DIB from her baseline, orthopnea. Unable to ambulate without significant dib, conversational dyspnea. Spoke with Dr Royce Macadamia who will have day team tomorrow see her for HD. Recommend admission, pt agreeable.   Social Determinants of health: Social History   Tobacco Use   Smoking status: Never    Passive exposure: Yes    Smokeless tobacco: Never   Tobacco comments:    Husband Smokes  Vaping Use   Vaping Use: Never used  Substance Use Topics   Alcohol use: No   Drug use: Yes    Types: Marijuana    Comment: not recent            Final Clinical Impression(s) / ED Diagnoses Final diagnoses:  Lingular pneumonia  ESRD on dialysis [facility])    Rx / DC Orders ED Discharge Orders     None         Jeanell Sparrow, DO 02/03/22 2314

## 2022-02-03 NOTE — ED Notes (Signed)
IV start successful but was unable to obtain labs with the IV start. This RN attempted to draw labs with a second stick but was unsuccessful. Dr. Pearline Cables informed. This RN has asked phlebotomy to attempt.

## 2022-02-03 NOTE — ED Notes (Signed)
Failed attempt to collect labs   

## 2022-02-03 NOTE — ED Provider Triage Note (Signed)
Emergency Medicine Provider Triage Evaluation Note  Desiree [name] , a 46 y.o. female  was evaluated in triage.  Pt complains of diarrhea, chest pain, abdominal pain, bilateral leg swelling.  Patient states she has had diarrhea in the last 3 days.  Denies any blood in the stool.  Pain is all over her abdomen.  States she started to have chest pain this morning.  Patient has dialysis on Tuesday Thursday Saturday.  Last dialysis was on Saturday last week.  She missed dialysis today.  Patient is on 3 L oxygen at home.  No fever. Review of Systems  Positive: As above Negative: As above  Physical Exam  BP (!) 141/96 (BP Location: Right Wrist)   Pulse 85   Temp 99.1 F (37.3 C)   Resp (!) 23   Ht 5\' 5"  (1.651 m)   Wt 87 kg   SpO2 100%   BMI 31.92 kg/m  Gen:   Awake, no distress   Resp:  Normal effort  MSK:   Moves extremities without difficulty  Other:  Bilateral leg swelling, TTP to abdomen.  Medical Decision Making  Medically screening exam initiated at 1:57 PM.  Appropriate orders placed.  Desiree [name] was informed that the remainder of the evaluation will be completed by another provider, this initial triage assessment does not replace that evaluation, and the importance of remaining in the ED until their evaluation is complete.     Rex Kras, [place] 02/03/22 2100

## 2022-02-03 NOTE — H&P (Signed)
PCP:   Harvie Junior, MD   Chief Complaint: Shortness of breath   HPI: This is a 46 year old female with PMHx of ESRD, anxiety and depression, GERD, hypertension, and chronic respiratory failure 3 L oxygen at baseline.  She is dialyzed T/Th/Sat.  She thought she had hemodialysis today but the dates had been adjusted for the holidays.  She was told her HD session was yesterday and she had missed it.  Today she woke up coughing, she called the HD center to ask extra dialysis, per patient she was told no.  As the day wore on she could hardly breathe even with her oxygen on.  Per patient had not eaten in 2 days, she denies nausea vomiting but endorses diarrhea which she has had for the last 3 days.  The patient endorses chronic diarrhea and chronic stomach problems for approximately a year.  She states she has chronic abdominal pain.  She has not seen GI.  She denies fevers or chills or wheezing.  Patient is concerned about her chronic lower extremity edema.  Patient came to the ER.  In the ER, patient with conversational dyspnea.  She is on 4 L oxygen satting 100%.  Respiration rate currently 25.  Review of Systems:  The patient denies anorexia, fever, weight loss,, vision loss, decreased hearing, hoarseness, chest pain, syncope, dyspnea on exertion, peripheral edema, balance deficits, hemoptysis, abdominal pain, melena, hematochezia, severe indigestion/heartburn, hematuria, incontinence, genital sores, muscle weakness, suspicious skin lesions, transient blindness, difficulty walking, depression, unusual weight change, abnormal bleeding, enlarged lymph nodes, angioedema, and breast masses. Positive: Anorexia, chronic diarrhea, shortness of breath, chronic abdominal pain, cough, wheeze, lower extremity edema  Past Medical History: Past Medical History:  Diagnosis Date   Anemia    Anxiety    panic attacks   Arthritis    bilateral knees   Childhood asthma    Complication of anesthesia     "sometimes it does not work; didn't during LEEP OR" (01/21/2016)   Depression    no med   ESRD (end stage renal disease) on dialysis ([place])    "TTS; [facility]; [facility]" (08/03/2016)   GERD (gastroesophageal reflux disease)    nexium prn   Gout    History of blood transfusion    "related to kidneys; I've had 4" (01/21/2016)   Hypertension    Migraine    last one 01/18/19   Preterm labor ~ 2014   Retina hole, left    Seizures ([place])    "last one was in 2000; related to preeclampsia" (01/21/2016)   Past Surgical History:  Procedure Laterality Date   A/V FISTULAGRAM Left 12/19/2021   Procedure: A/V Fistulagram;  Surgeon: Angelia Mould, MD;  Location: [facility];  Service: Cardiovascular;  Laterality: Left;   AV FISTULA PLACEMENT Left 2010   AV FISTULA PLACEMENT Left 01/20/2019   Procedure: ARTERIOVENOUS (AV) FISTULA CREATION LEFT UPPER ARM;  Surgeon: Angelia Mould, MD;  Location: [facility];  Service: Vascular;  Laterality: Left;   AV FISTULA PLACEMENT Left 12/19/2021   Procedure: ARTERIOVENOUS (AV) FISTULA CREATION POSSIBLE TUNNELED DIALYSIS;  Surgeon: Waynetta Sandy, MD;  Location: [facility];  Service: Vascular;  Laterality: Left;   CERVICAL BIOPSY  W/ LOOP ELECTRODE EXCISION  2001   DILATION AND EVACUATION  08/02/2011   Procedure: DILATATION AND EVACUATION;  Surgeon: Logan Bores, MD;  Location: [facility];  Service: Gynecology;;   DILATION AND EVACUATION N/A 08/31/2013   Procedure: DILATATION AND EVACUATION;  Surgeon:  Woodroe Mode, MD;  Location: [facility];  Service: Gynecology;  Laterality: N/A;   FISTULA SUPERFICIALIZATION Left 01/20/2019   Procedure: Fistula Superficialization;  Surgeon: Angelia Mould, MD;  Location: [facility];  Service: Vascular;  Laterality: Left;   HYDRADENITIS EXCISION Right    INSERTION OF DIALYSIS CATHETER Right 12/19/2021   Procedure: INSERTION OF A TUNNELED DIALYSIS CATHETER;  Surgeon: Waynetta Sandy, MD;   Location: [facility];  Service: Vascular;  Laterality: Right;   RENAL BIOPSY     REVISION OF ARTERIOVENOUS GORETEX GRAFT Left 02/11/2013   Procedure: REVISION OF ARTERIOVENOUS GORTEX FISTULA;  Surgeon: Rosetta Posner, MD;  Location: [facility];  Service: Vascular;  Laterality: Left;   THROMBECTOMY W/ EMBOLECTOMY Left 05/25/2018   Procedure: REPAIR OF BLEEDING ARTERIOVENOUS FISTULA;  Surgeon: Serafina Mitchell, MD;  Location: [facility];  Service: Vascular;  Laterality: Left;    Medications: Prior to Admission medications   Medication Sig Start Date End Date Taking? Authorizing Provider  albuterol (PROVENTIL HFA;VENTOLIN HFA) 108 (90 Base) MCG/ACT inhaler Inhale 2 puffs into the lungs every 4 (four) hours as needed for wheezing or shortness of breath. 05/02/16   Forde Dandy, MD  ALPRAZolam Duanne Moron) 1 MG tablet Take 1 mg by mouth 3 (three) times daily as needed. 11/25/21   [provider]  aspirin EC 81 MG tablet Take 81 mg by mouth daily. Swallow whole.    [provider]  calcium acetate (PHOSLO) 667 MG capsule Take 667-1,334 mg by mouth See admin instructions. 3 capsules with meals  1 to 2 capsules with snacks 11/30/17   [provider]  cinacalcet (SENSIPAR) 60 MG tablet Take 60 mg by mouth every evening.    [provider]  cyclobenzaprine (FLEXERIL) 10 MG tablet Take 10 mg by mouth 2 (two) times daily as needed for muscle spasms.    [provider]  diphenhydrAMINE (BENADRYL) 25 MG tablet Take 1 tablet (25 mg total) by mouth every 6 (six) hours. Patient taking differently: Take 25 mg by mouth every 6 (six) hours as needed for itching or allergies. 09/05/17   Domenic Moras, PA-C  lactulose (CHRONULAC) 10 GM/15ML solution Take 30 mLs by mouth daily as needed for mild constipation, moderate constipation or severe constipation. 09/04/21   [provider]  NARCAN 4 MG/0.1ML LIQD nasal spray kit Place 1 spray into the nose as needed (accidental overdose). 11/14/18    [provider]  nystatin-triamcinolone (MYCOLOG II) cream Apply topically 2 (two) times daily. 09/09/21   [provider]  ondansetron (ZOFRAN-ODT) 8 MG disintegrating tablet Take 8 mg by mouth every 8 (eight) hours as needed for nausea or vomiting.     [provider]  Oxycodone HCl 10 MG TABS Take 1 tablet (10 mg total) by mouth 3 (three) times daily as needed (severe pain). 12/22/21   Arrien, Jimmy Picket, MD  pantoprazole (PROTONIX) 20 MG tablet Take 20 mg by mouth daily. 11/25/21   [provider]  sevelamer carbonate (RENVELA) 800 MG tablet Take 1,600 mg by mouth 3 (three) times daily. 05/18/19   [provider]  SUMAtriptan (IMITREX) 100 MG tablet Take 100 mg by mouth as directed. 09/23/21   [provider]    Allergies:   Allergies  Allergen Reactions   Morphine Shortness Of Breath and Anaphylaxis   Prednisone Other (See Comments)    Other reaction(s): Other (See Comments) Muscle spasms Patient says prednisone causes her to cramp all over, muscle spasms uncontrolled  Geralyn Flash [Fish Allergy] Itching, Swelling, Rash and Other (See Comments)    Face droops also   Amlodipine Other (See Comments)    Angioedema (09/05/17 ED visit)   Iodinated Contrast Media Itching   Tape Itching    Adhesive tape   paper tape ok    Social History:  reports that she has never smoked. She has been exposed to tobacco smoke. She has never used smokeless tobacco. She reports current drug use. Drug: Marijuana. She reports that she does not drink alcohol.  Family History: Family History  Problem Relation Age of Onset   Diabetes Mother    Hyperlipidemia Mother    Hypertension Mother    Heart disease Mother    Hypertension Other    Diabetes type II Other     Physical Exam: Vitals:   02/03/22 1635 02/03/22 2115 02/03/22 2200 02/03/22 2224  BP: (!) 183/84 (!) 181/83 (!) 166/81   Pulse: 83 80 82   Resp: (!) 23 (!) 32 (!) 21   Temp:    98.6 F (37  C)  TempSrc:    Oral  SpO2: 100% 100% 100%   Weight:      Height:        General:  Alert and oriented times three, well developed and nourished, conversational dyspnea, uncomfortable Eyes: PERRLA, pink conjunctiva, no scleral icterus ENT: Moist oral mucosa, neck supple, no thyromegaly Lungs: Shallow breathing, no use of accessory muscles, tachypneic, HD cath right shoulder Cardiovascular: regular rate and rhythm, no regurgitation, no gallops, no murmurs. No carotid bruits, no JVD Abdomen: soft, positive BS, non-tender, non-distended, no organomegaly, not an acute abdomen GU: not examined Neuro: CN II - XII grossly intact, sensation intact Musculoskeletal: strength 5/5 all extremities, no clubbing, cyanosis. 2+ B/L LE edema Skin: no rash, no subcutaneous crepitation, no decubitus Psych: Uncomfortable patient   Labs on Admission:  Recent Labs    02/03/22 1714 02/03/22 1906  NA 139 143  K 5.4* 4.6  CL  --  101  CO2  --  24  GLUCOSE  --  102*  BUN  --  90*  CREATININE  --  14.01*  CALCIUM  --  9.2   Recent Labs    02/03/22 1906  AST 24  ALT 40  ALKPHOS 47  BILITOT 0.5  PROT 7.1  ALBUMIN 3.2*   Recent Labs    02/03/22 1906  LIPASE 62*   Recent Labs    02/03/22 1708 02/03/22 1714  WBC 6.5  --   NEUTROABS 4.9  --   HGB 9.1* 9.9*  HCT 29.7* 29.0*  MCV 83.9  --   PLT 180  --      Micro Results: Recent Results (from the past 240 hour(s))  Resp Panel by RT-PCR (Flu A&B, Covid) Anterior Nasal Swab     Status: None   Collection Time: 02/03/22  6:11 PM   Specimen: Anterior Nasal Swab  Result Value Ref Range Status   SARS Coronavirus 2 by RT PCR NEGATIVE NEGATIVE Final    Comment: (NOTE) SARS-CoV-2 target nucleic acids are NOT DETECTED.  The SARS-CoV-2 RNA is generally detectable in upper respiratory specimens during the acute phase of infection. The lowest concentration of SARS-CoV-2 viral copies this assay can detect is 138 copies/mL. A negative result  does not preclude SARS-Cov-2 infection and should not be used as the sole basis for treatment or other patient management decisions. A negative result may occur with  improper specimen collection/handling, submission of specimen other than  nasopharyngeal swab, presence of viral mutation(s) within the areas targeted by this assay, and inadequate number of viral copies(<138 copies/mL). A negative result must be combined with clinical observations, patient history, and epidemiological information. The expected result is Negative.  Fact Sheet for Patients:  EntrepreneurPulse.com.au  Fact Sheet for Healthcare Providers:  IncredibleEmployment.be  This test is no t yet approved or cleared by the [place] FDA and  has been authorized for detection and/or diagnosis of SARS-CoV-2 by FDA under an Emergency Use Authorization (EUA). This EUA will remain  in effect (meaning this test can be used) for the duration of the COVID-19 declaration under Section 564(b)(1) of the Act, 21 U.S.C.section 360bbb-3(b)(1), unless the authorization is terminated  or revoked sooner.       Influenza A by PCR NEGATIVE NEGATIVE Final   Influenza B by PCR NEGATIVE NEGATIVE Final    Comment: (NOTE) The Xpert Xpress SARS-CoV-2/FLU/RSV plus assay is intended as an aid in the diagnosis of influenza from Nasopharyngeal swab specimens and should not be used as a sole basis for treatment. Nasal washings and aspirates are unacceptable for Xpert Xpress SARS-CoV-2/FLU/RSV testing.  Fact Sheet for Patients: EntrepreneurPulse.com.au  Fact Sheet for Healthcare Providers: IncredibleEmployment.be  This test is not yet approved or cleared by the [place] FDA and has been authorized for detection and/or diagnosis of SARS-CoV-2 by FDA under an Emergency Use Authorization (EUA). This EUA will remain in effect (meaning this test can be used) for  the duration of the COVID-19 declaration under Section 564(b)(1) of the Act, 21 U.S.C. section 360bbb-3(b)(1), unless the authorization is terminated or revoked.  Performed at [facility], [place] [address], [place], [place] [address]      Radiological Exams on Admission: CT CHEST ABDOMEN PELVIS WO CONTRAST  Result Date: 02/03/2022 CLINICAL DATA:  Nonspecific chest pain. Shortness of breath. Diarrhea. EXAM: CT CHEST, ABDOMEN AND PELVIS WITHOUT CONTRAST TECHNIQUE: Multidetector CT imaging of the chest, abdomen and pelvis was performed following the standard protocol without IV contrast. RADIATION DOSE REDUCTION: This exam was performed according to the departmental dose-optimization program which includes automated exposure control, adjustment of the mA and/or kV according to patient size and/or use of iterative reconstruction technique. COMPARISON:  Radiograph earlier today. Chest CTA 12/05/2021, abdominopelvic CT 02/15/2021 FINDINGS: CT CHEST FINDINGS Cardiovascular: Right-sided dialysis catheter in place. Heart is upper normal in size. There is a small circumferential pericardial effusion. Mild mitral annulus calcifications. Age advanced aortic atherosclerosis. Mediastinum/Nodes: No bulky adenopathy, lack of contrast and motion limits detailed assessment. Patulous esophagus. No thyroid nodule. Lungs/Pleura: There are small bilateral pleural effusions, slightly diminished from prior CT. Mild diffuse ground-glass opacity in septal thickening typical of pulmonary edema. There is also bronchial thickening which may be congestive. Airspace disease in the lingula with air bronchograms. Additional airspace disease in the left lower and central right lower lobes. Mild bandlike atelectasis in the right middle lobe. Musculoskeletal: There are no acute or suspicious osseous abnormalities. CT ABDOMEN PELVIS FINDINGS Hepatobiliary: No focal hepatic abnormality on this unenhanced exam. Possible layering  sludge in the gallbladder. Obvious pericholecystic inflammation. No biliary dilatation. Pancreas: Grossly negative. Spleen: Unremarkable noncontrast appearance. Adrenals/Urinary Tract: No adrenal nodule. Chronic bilateral renal parenchymal atrophy. Small cysts in the right kidney. No specific imaging follow-up is recommended. Peripherally calcified cyst in the left kidney measures 8.4 cm, similar to prior. No follow-up imaging is recommended. Nondistended urinary bladder. Stomach/Bowel: Elongated air-filled appendix courses into the right upper quadrant lateral to the liver. There  is scattered colonic diverticula without diverticulitis. No colonic inflammation. No small bowel obstruction. Vascular/Lymphatic: Mild aortic atherosclerosis. No aneurysm. No bulky abdominopelvic adenopathy. Reproductive: Unremarkable appearance of the uterus. Decreased size of cystic changes in the right ovary. No suspicious adnexal mass. Other: Small amount of ascites in the mesentery, pelvis and right upper quadrant. No free air. No focal fluid collection. Generalized body wall edema. Musculoskeletal: There are no acute or suspicious osseous abnormalities. IMPRESSION: 1. Multifocal airspace disease suspicious for pneumonia, most prominent in the lingula. 2. Small bilateral pleural effusions, slightly diminished from prior CT. Mild pulmonary edema. 3. Small amount of ascites in the mesentery, pelvis and right upper quadrant. Generalized body wall edema. 4. Colonic diverticulosis without diverticulitis. Aortic Atherosclerosis (ICD10-I70.0). Electronically Signed   By: Keith Rake M.D.   On: 02/03/2022 21:25   DG Chest 2 View  Result Date: 02/03/2022 CLINICAL DATA:  Chest pain and short of breath.  Dialysis patient EXAM: CHEST - 2 VIEW COMPARISON:  Chest 12/21/2021 FINDINGS: Progression of bilateral airspace disease compatible with pulmonary edema. No significant pleural effusion. Central venous catheter tip in the right atrium  unchanged. IMPRESSION: Progression of bilateral airspace disease compatible with pulmonary edema. Electronically Signed   By: Franchot Gallo M.D.   On: 02/03/2022 14:35    Assessment/Plan Present on Admission:  CAP (community acquired pneumonia) due to Pneum[facility])  Chronic respiratory failure with hypoxia ([place]) -Admit to med telemetry -Pneumonia order set initiated -Urinary Legionella and strep antigens ordered -Rocephin and azithromycin ordered -Continue oxygen, goal sats 88% and above -Nebulizers as needed   Volume overload/pulmonary edema  ESRD (end stage renal disease) ([place]) -HD planned for the a.m.  Nephrology aware -Sensipar, PhosLo, Renvela resumed  Anxiety -Continue Xanax as needed  Chronic abdominal pain/diarrhea -Stool cultures.  Likely outpatient GI follow-up -Oxycodone as needed, currently held.  HTN, uncontrolled -Currently more elevated this patient need of HD.  Will likely normalize post HD -As needed blood pressure medications   Margarita Bobrowski 02/03/2022, 10:29 PM

## 2022-02-03 NOTE — ED Notes (Signed)
The patient has returned from [place].

## 2022-02-03 NOTE — ED Notes (Signed)
Dr Gray at bedside  

## 2022-02-03 NOTE — ED Notes (Signed)
Cultures drawn off IV. Blood wasted between draws and 'clean' technique used. Phlebotomy attempts x2 with [place] without success-attempted by 2 Rns as well. Pt required [place] IV by provider pre-culture order-difficult stick.

## 2022-02-03 NOTE — ED Triage Notes (Signed)
Pt endorses SOB and diarrhea. Pt missed dialysis yesterday. Reports swelling all over for months. Also having chest pain. Pt wears 3L O2 at home.

## 2022-02-03 NOTE — ED Notes (Signed)
This RN calling to check on the status of the CMP and Lipase. Was told new orders need to be placed.

## 2022-02-03 NOTE — ED Notes (Signed)
Lab has called and reports the CMP and Lipase have hemolyzed.

## 2022-02-03 NOTE — ED Notes (Signed)
Pt reports she does not make urine. Unable to obtain urine sample.

## 2022-02-03 NOTE — ED Notes (Signed)
Dr. Pearline Cables at bedside to attempt to draw her labs.

## 2022-02-03 NOTE — ED Notes (Signed)
Patient transported to CT 

## 2022-02-04 DIAGNOSIS — J13 Pneumonia due to Streptococcus pneumoniae: Secondary | ICD-10-CM | POA: Diagnosis not present

## 2022-02-04 LAB — CBC WITH DIFFERENTIAL/PLATELET
Abs Immature Granulocytes: 0.03 10*3/uL (ref 0.00–0.07)
Basophils Absolute: 0 10*3/uL (ref 0.0–0.1)
Basophils Relative: 0 %
Eosinophils Absolute: 0.3 10*3/uL (ref 0.0–0.5)
Eosinophils Relative: 4 %
HCT: 29.7 % — ABNORMAL LOW (ref 36.0–46.0)
Hemoglobin: 8.6 g/dL — ABNORMAL LOW (ref 12.0–15.0)
Immature Granulocytes: 0 %
Lymphocytes Relative: 18 %
Lymphs Abs: 1.4 10*3/uL (ref 0.7–4.0)
MCH: 25.1 pg — ABNORMAL LOW (ref 26.0–34.0)
MCHC: 29 g/dL — ABNORMAL LOW (ref 30.0–36.0)
MCV: 86.8 fL (ref 80.0–100.0)
Monocytes Absolute: 0.7 10*3/uL (ref 0.1–1.0)
Monocytes Relative: 9 %
Neutro Abs: 5.3 10*3/uL (ref 1.7–7.7)
Neutrophils Relative %: 69 %
Platelets: 178 10*3/uL (ref 150–400)
RBC: 3.42 MIL/uL — ABNORMAL LOW (ref 3.87–5.11)
RDW: 18 % — ABNORMAL HIGH (ref 11.5–15.5)
WBC: 7.8 10*3/uL (ref 4.0–10.5)
nRBC: 0 % (ref 0.0–0.2)

## 2022-02-04 LAB — HEPATITIS B SURFACE ANTIGEN: Hepatitis B Surface Ag: NONREACTIVE

## 2022-02-04 MED ORDER — HYDRALAZINE HCL 20 MG/ML IJ SOLN: 10.0000 mg | INTRAMUSCULAR | Status: DC | PRN

## 2022-02-04 MED ORDER — DARBEPOETIN ALFA 150 MCG/0.3ML IJ SOSY: 150.0000 ug | PREFILLED_SYRINGE | Freq: Once | INTRAMUSCULAR | Status: DC

## 2022-02-04 MED ORDER — ONDANSETRON HCL 4 MG/2ML IJ SOLN
INTRAMUSCULAR | Status: AC
  Filled 2022-02-04: qty 2

## 2022-02-04 MED ORDER — ONDANSETRON HCL 4 MG/2ML IJ SOLN
4.0000 mg | Freq: Once | INTRAMUSCULAR | Status: AC
  Administered 2022-02-04: 4 mg via INTRAVENOUS

## 2022-02-04 MED ORDER — AMOXICILLIN-POT CLAVULANATE 875-125 MG PO TABS: 1.0000 | ORAL_TABLET | Freq: Two times a day (BID) | ORAL | 0 refills | Status: DC

## 2022-02-04 MED ORDER — ASPIRIN 81 MG PO TBEC: 81.0000 mg | DELAYED_RELEASE_TABLET | Freq: Every day | ORAL | Status: DC

## 2022-02-04 MED ORDER — OXYCODONE HCL 5 MG PO TABS
10.0000 mg | ORAL_TABLET | ORAL | Status: DC | PRN
  Administered 2022-02-04: 10 mg via ORAL
  Filled 2022-02-04: qty 2

## 2022-02-04 MED ORDER — OXYCODONE HCL 5 MG PO TABS: 10.0000 mg | ORAL_TABLET | Freq: Once | ORAL | Status: DC

## 2022-02-04 MED ORDER — ALPRAZOLAM 0.25 MG PO TABS
1.0000 mg | ORAL_TABLET | Freq: Once | ORAL | Status: AC
  Administered 2022-02-04: 1 mg via ORAL
  Filled 2022-02-04: qty 4

## 2022-02-04 MED ORDER — LORAZEPAM 2 MG/ML IJ SOLN: 0.5000 mg | Freq: Once | INTRAMUSCULAR | Status: DC

## 2022-02-04 MED ORDER — OXYCODONE HCL 5 MG PO TABS
10.0000 mg | ORAL_TABLET | Freq: Once | ORAL | Status: AC | PRN
  Administered 2022-02-04: 10 mg via ORAL
  Filled 2022-02-04: qty 2

## 2022-02-04 MED ORDER — HEPARIN SODIUM (PORCINE) 1000 UNIT/ML IJ SOLN
INTRAMUSCULAR | Status: AC
  Filled 2022-02-04: qty 4

## 2022-02-04 NOTE — Progress Notes (Signed)
Received patient in bed to unit.  Alert and oriented.  Informed consent signed and in chart.   Treatment initiated: 2111 Treatment completed: 1230  Patient tolerated well.  Transported back to the room  Alert, without acute distress.  Hand-off given to patient's nurse.   Access used: Catheter  Total UF removed: 2.3L Medication(s) given: Zofran Post HD weight: ER Stretcher        02/04/22 1250  Vitals  Temp 98.9 F (37.2 C)  Temp Source Oral  BP (!) 140/72  MAP (mmHg) 92  BP Location Right Wrist  BP Method Automatic  Patient Position (if appropriate) Lying  Pulse Rate 98  Pulse Rate Source Monitor  ECG Heart Rate 98  Resp (!) 26  Oxygen Therapy  SpO2 100 %  O2 Device Nasal Cannula  O2 Flow Rate (L/min) 3 L/min  Patient Activity (if Appropriate) In bed  Pulse Oximetry Type Continuous  During Treatment Monitoring  Intra-Hemodialysis Comments Tx completed  Post Treatment  Duration of HD Treatment -hour(s) 4 hour(s)  Liters Processed 96  Fluid Removed (mL) 2300 mL  Tolerated HD Treatment Yes       [facility]

## 2022-02-04 NOTE — Consult Note (Signed)
Desiree [name] Admit Date: 02/03/2022 02/04/2022 Rexene Agent Requesting Physician:  Cruzita Lederer MD  Reason for Consult:  ESRD comamagnent, SOB HPI:  68F ESRD THS [place] admitted overnight after missing HD 02/02/22 and developing a cough and SOB.  Has chronic O2 req usually 3L, now 4L.  ED imaging most consistent with pulmonary edema.  BPs eelvated 160s - 170s SBP, afebrile.  WBC 7.8.  Admitted for vol overload from missed HD.   Recent admission last month for infected LUE AVF req excision and placement of TDC.    Last HD 11/18 cut short by 82mn and left 1.6kg above EDW.  Outpt HD Orders Unit: [facility] Days: [facility] Time: 3h482m Dialyzer: F180 EDW: 87kg K/Ca: 2/2 Access: TDC, recent AVF revision healing Needle Size: 1574mFR/DFR: 400/600 UF Proflie: UFP 4 VDRA: hectorol 8mc78mTx EPO: Mircera 150 q2wk last given 11/7 IV Fe: none Heparin: bolus 6600 units qTx  Balance of 12 systems is negative w/ exceptions as above  PMH  Past Medical History:  Diagnosis Date   Anemia    Anxiety    panic attacks   Arthritis    bilateral knees   Childhood asthma    Complication of anesthesia    "sometimes it does not work; didn't during LEEP OR" (01/21/2016)   Depression    no med   ESRD (end stage renal disease) on dialysis (HCC)[place] "TTS; [facility]; JameStarling Manns/21/2018)   GERD (gastroesophageal reflux disease)    nexium prn   Gout    History of blood transfusion    "related to kidneys; I've had 4" (01/21/2016)   Hypertension    Migraine    last one 01/18/19   Preterm labor ~ 2014   Retina hole, left    Seizures (HCC)[place] "last one was in 2000; related to preeclampsia" (01/21/2016)   [facility]:  Procedure Laterality Date   A/V FISTULAGRAM Left 12/19/2021   Procedure: A/V Fistulagram;  Surgeon: DickAngelia Mould;  [facility];  Service: Cardiovascular;  Laterality: Left;   AV FISTULA PLACEMENT Left 2010   AV FISTULA  PLACEMENT Left 01/20/2019   Procedure: ARTERIOVENOUS (AV) FISTULA CREATION LEFT UPPER ARM;  Surgeon: DickAngelia Mould;  [facility];  Laterality: Left;   AV FISTULA PLACEMENT Left 12/19/2021   Procedure: ARTERIOVENOUS (AV) FISTULA CREATION POSSIBLE TUNNELED DIALYSIS;  Surgeon: CainWaynetta Sandy;  [facility];  Laterality: Left;   CERVICAL BIOPSY  W/ LOOP ELECTRODE EXCISION  2001   DILATION AND EVACUATION  08/02/2011   Procedure: DILATATION AND EVACUATION;  Surgeon: KathLogan Bores;  [facility];  Service: Gynecology;;   DILATION AND EVACUATION N/A 08/31/2013   Procedure: DILATATION AND EVACUATION;  Surgeon: JameWoodroe Mode;  [facility];  Service: Gynecology;  Laterality: N/A;   FISTULA SUPERFICIALIZATION Left 01/20/2019   Procedure: Fistula Superficialization;  Surgeon: DickAngelia Mould;  [facility];  Laterality: Left;   HYDRADENITIS EXCISION Right    INSERTION OF DIALYSIS CATHETER Right 12/19/2021   Procedure: INSERTION OF A TUNNELED DIALYSIS CATHETER;  Surgeon: CainWaynetta Sandy;  [facility];  Laterality: Right;   RENAL BIOPSY     REVISION OF ARTERIOVENOUS GORETEX GRAFT Left 02/11/2013   Procedure: REVISION OF ARTERIOVENOUS GORTEX FISTULA;  Surgeon: ToddRosetta Posner;  [facility]  Service: Vascular;  Laterality: Left;   THROMBECTOMY W/ EMBOLECTOMY Left 05/25/2018   Procedure: REPAIR OF BLEEDING ARTERIOVENOUS FISTULA;  Surgeon: Serafina Mitchell, MD;  [facility];  Service: Vascular;  Laterality: Left;   FH  Family History  Problem Relation Age of Onset   Diabetes Mother    Hyperlipidemia Mother    Hypertension Mother    Heart disease Mother    Hypertension Other    Diabetes type II Other    Zeba  reports that she has never smoked. She has been exposed to tobacco smoke. She has never used smokeless tobacco. She reports current  drug use. Drug: Marijuana. She reports that she does not drink alcohol. Allergies  Allergies  Allergen Reactions   Morphine Shortness Of Breath and Anaphylaxis   Prednisone Other (See Comments)    Other reaction(s): Other (See Comments) Muscle spasms Patient says prednisone causes her to cramp all over, muscle spasms uncontrolled   Tuna [Fish Allergy] Itching, Swelling, Rash and Other (See Comments)    Face droops also   Amlodipine Other (See Comments)    Angioedema (09/05/17 ED visit)   Iodinated Contrast Media Itching   Tape Itching    Adhesive tape   paper tape ok   Home medications Prior to Admission medications   Medication Sig Start Date End Date Taking? Authorizing Provider  albuterol (PROVENTIL HFA;VENTOLIN HFA) 108 (90 Base) MCG/ACT inhaler Inhale 2 puffs into the lungs every 4 (four) hours as needed for wheezing or shortness of breath. 05/02/16   Forde Dandy, MD  ALPRAZolam Duanne Moron) 1 MG tablet Take 1 mg by mouth 3 (three) times daily as needed. 11/25/21   [provider]  aspirin EC 81 MG tablet Take 81 mg by mouth daily. Swallow whole.    [provider]  calcium acetate (PHOSLO) 667 MG capsule Take 667-1,334 mg by mouth See admin instructions. 3 capsules with meals  1 to 2 capsules with snacks 11/30/17   [provider]  cinacalcet (SENSIPAR) 60 MG tablet Take 60 mg by mouth every evening.    [provider]  cyclobenzaprine (FLEXERIL) 10 MG tablet Take 10 mg by mouth 2 (two) times daily as needed for muscle spasms.    [provider]  diphenhydrAMINE (BENADRYL) 25 MG tablet Take 1 tablet (25 mg total) by mouth every 6 (six) hours. Patient taking differently: Take 25 mg by mouth every 6 (six) hours as needed for itching or allergies. 09/05/17   Domenic Moras, PA-C  lactulose (CHRONULAC) 10 GM/15ML solution Take 30 mLs by mouth daily as needed for mild constipation, moderate constipation or severe constipation. 09/04/21   [provider]  NARCAN 4 MG/0.1ML LIQD nasal spray kit Place 1 spray into the nose as needed (accidental overdose). 11/14/18   [provider]  nystatin-triamcinolone (MYCOLOG II) cream Apply topically 2 (two) times daily. 09/09/21   [provider]  ondansetron (ZOFRAN-ODT) 8 MG disintegrating tablet Take 8 mg by mouth every 8 (eight) hours as needed for nausea or vomiting.     [provider]  Oxycodone HCl 10 MG TABS Take 1 tablet (10 mg total) by mouth 3 (three) times daily as needed (severe pain). 12/22/21   Arrien, Jimmy Picket, MD  pantoprazole (PROTONIX) 20 MG tablet Take 20 mg by mouth daily. 11/25/21   [provider]  sevelamer carbonate (RENVELA) 800 MG tablet Take 1,600 mg by mouth 3 (three) times daily. 05/18/19   [provider]  SUMAtriptan Dellis Filbert)  100 MG tablet Take 100 mg by mouth as directed. 09/23/21   [provider]    Current Medications Scheduled Meds:  aspirin EC  81 mg Oral Daily   calcium acetate  2,001 mg Oral TID WC   cinacalcet  60 mg Oral QPM   heparin  5,000 Units Subcutaneous Q8H   heparin sodium (porcine)       ondansetron       sevelamer carbonate  1,600 mg Oral TID WC   Continuous Infusions:  azithromycin     cefTRIAXone (ROCEPHIN)  IV     PRN Meds:.calcium acetate, heparin sodium (porcine), hydrALAZINE, ondansetron  CBC Recent Labs  Lab 02/03/22 1708 02/03/22 1714 02/04/22 0213  WBC 6.5  --  7.8  NEUTROABS 4.9  --  5.3  HGB 9.1* 9.9* 8.6*  HCT 29.7* 29.0* 29.7*  MCV 83.9  --  86.8  PLT 180  --  263   Basic Metabolic Panel Recent Labs  Lab 02/03/22 1714 02/03/22 1906  NA 139 143  K 5.4* 4.6  CL  --  101  CO2  --  24  GLUCOSE  --  102*  BUN  --  90*  CREATININE  --  14.01*  CALCIUM  --  9.2    Physical Exam  Blood pressure (!) 152/71, pulse 94, temperature 98.9 F (37.2 C), temperature source Oral, resp. rate (!) 26, height _0  (1.651 m), weight 87 kg, SpO2 100 %. GEN: Mild  Resp Distress ENT: NCAT EYES: EOMI CV: RRR PULM: [place] BS b/l ABD: s/nt/nd SKIN: no rashes/lesions; TDC exit site c/d/i EXT: 2+ LEE  Assessment 15F ESRD with pulmonary edema and AHRF after missing HD.   ESRD [facility] Pulmonary Edema / Volume overload after missing 11/20 HD Infected AVF s/p exicions last mo, using TDC CKD-BMD, chronic hyperphosphatemia Anemia, Hb 8.6, due for ESA give today  Plan HD today for vol overload Then back on THS schedule with daily assessment Give ESA today  Cont outpt VDRA and binder Daily weights, Daily Renal Panel, Strict I/Os, Avoid nephrotoxins (NSAIDs, judicious IV Contrast)    Rexene Agent  02/04/2022, 1:40 PM

## 2022-02-04 NOTE — Procedures (Signed)
I was present at this dialysis session. I have reviewed the session itself and made appropriate changes.   Having nausea/vomiting.  Use zofran. Has hx/o QTc prolongation most recent EKG was ok.    2K bath.  UF goal  4L. Using [facility].    Filed Weights   02/03/22 1321  Weight: 87 kg    Recent Labs  Lab 02/03/22 1906  NA 143  K 4.6  CL 101  CO2 24  GLUCOSE 102*  BUN 90*  CREATININE 14.01*  CALCIUM 9.2    Recent Labs  Lab 02/03/22 1708 02/03/22 1714 02/04/22 0213  WBC 6.5  --  7.8  NEUTROABS 4.9  --  5.3  HGB 9.1* 9.9* 8.6*  HCT 29.7* 29.0* 29.7*  MCV 83.9  --  86.8  PLT 180  --  178    Scheduled Meds:  aspirin EC  81 mg Oral Daily   calcium acetate  2,001 mg Oral TID WC   cinacalcet  60 mg Oral QPM   heparin  5,000 Units Subcutaneous Q8H   ondansetron (ZOFRAN) IV  4 mg Intravenous Once   sevelamer carbonate  1,600 mg Oral TID WC   Continuous Infusions:  azithromycin     cefTRIAXone (ROCEPHIN)  IV     PRN Meds:.calcium acetate, hydrALAZINE   Pearson Grippe  MD 02/04/2022, 8:53 AM

## 2022-02-04 NOTE — Care Management Obs Status (Signed)
[place] NOTIFICATION   Patient Details  Name: Desiree [name] MRN: [id number] Date of Birth: 01-03-1976   Medicare Observation Status Notification Given:  Yes    Fuller Mandril, RN 02/04/2022, 3:32 PM

## 2022-02-04 NOTE — ED Notes (Signed)
PT transported to dialysis with this RN

## 2022-02-04 NOTE — Discharge Summary (Signed)
Physician Discharge Summary  Desiree [name] ZOX:[id number] DOB: [birth year] DOA: 02/03/2022  PCP: Harvie Junior, MD  Admit date: 02/03/2022 Discharge date: 02/04/2022  Admitted From: home Disposition:  home  Recommendations for Outpatient Follow-up:  Follow up with PCP in 1-2 weeks  Home Health: none Equipment/Devices: chronic home O2  Discharge Condition: stable CODE STATUS: Full code Diet Orders (From admission, onward)     Start     Ordered   02/03/22 2302  Diet Heart Room service appropriate? Yes; Fluid consistency: Thin  Diet effective now       Question Answer Comment  Room service appropriate? Yes   Fluid consistency: Thin      02/03/22 2305            HPI: Per admitting MD, This is a 46 year old female with PMHx of ESRD, anxiety and depression, GERD, hypertension, and chronic respiratory failure 3 L oxygen at baseline.  She is dialyzed T/Th/Sat.  She thought she had hemodialysis today but the dates had been adjusted for the holidays.  She was told her HD session was yesterday and she had missed it.  Today she woke up coughing, she called the HD center to ask extra dialysis, per patient she was told no.  As the day wore on she could hardly breathe even with her oxygen on.  Per patient had not eaten in 2 days, she denies nausea vomiting but endorses diarrhea which she has had for the last 3 days.  The patient endorses chronic diarrhea and chronic stomach problems for approximately a year.  She states she has chronic abdominal pain.  She has not seen GI.  She denies fevers or chills or wheezing.  Patient is concerned about her chronic lower extremity edema.  Patient came to the ER.   Hospital Course / Discharge diagnoses: Principal Problem:   CAP (community acquired pneumonia) due to Pneumococcus [facility]) Active Problems:   ESRD (end stage renal disease) on dialysis ([place])   ESRD (end stage renal disease) ([place])   Prolonged QT interval   CHF (congestive heart  failure) (H[facility])   Volume overload   Chronic respiratory failure with hypoxia (HCC)   Anxiety and depression   Gout   HTN (hypertension)   Community acquired pneumonia  Principal problem Acute on chronic hypoxic respiratory failure-this is likely multifactorial in the setting of suspected underlying pneumonia as well as pulmonary edema due to missed HD.  She was placed on antibiotics, nephrology was consulted and underwent dialysis on 11/22 with significant improvement in her respiratory status.  She is back to baseline, at home she is on 3 L of oxygen, and will be discharged home in stable condition.  She will be placed on a short course of Augmentin  Active problems ESRD-with missed dialysis and respiratory problems.  Breathing is improved significantly after HD.  Outpatient management Chronic pain-continue home medications Anxiety-continue home medications Chronic abdominal pain-outpatient GI follow-up, CT scan of the abdomen and pelvis unremarkable Essential hypertension-blood pressure elevated in the setting of fluid overload, improved after HD Anemia of ESRD-hemoglobin stable, no bleeding  Sepsis ruled out   Discharge Instructions   Allergies as of 02/04/2022       Reactions   Morphine Shortness Of Breath, Anaphylaxis   Prednisone Other (See Comments)   Other reaction(s): Other (See Comments) Muscle spasms Patient says prednisone causes her to cramp all over, muscle spasms uncontrolled   Tuna [fish Allergy] Itching, Swelling, Rash, Other (See Comments)   Face droops also  Amlodipine Other (See Comments)   Angioedema (09/05/17 ED visit)   Iodinated Contrast Media Itching   Tape Itching   Adhesive tape   paper tape ok        Medication List     TAKE these medications    albuterol 108 (90 Base) MCG/ACT inhaler Commonly known as: VENTOLIN HFA Inhale 2 puffs into the lungs every 4 (four) hours as needed for wheezing or shortness of breath.   ALPRAZolam 1 MG  tablet Commonly known as: XANAX Take 1 mg by mouth 3 (three) times daily as needed.   amoxicillin-clavulanate 875-125 MG tablet Commonly known as: AUGMENTIN Take 1 tablet by mouth 2 (two) times daily.   aspirin EC 81 MG tablet Take 81 mg by mouth daily. Swallow whole.   calcium acetate 667 MG capsule Commonly known as: PHOSLO Take 667-1,334 mg by mouth See admin instructions. 3 capsules with meals  1 to 2 capsules with snacks   cinacalcet 60 MG tablet Commonly known as: SENSIPAR Take 60 mg by mouth every evening.   cyclobenzaprine 10 MG tablet Commonly known as: FLEXERIL Take 10 mg by mouth 2 (two) times daily as needed for muscle spasms.   diphenhydrAMINE 25 MG tablet Commonly known as: BENADRYL Take 1 tablet (25 mg total) by mouth every 6 (six) hours. What changed:  when to take this reasons to take this   lactulose 10 GM/15ML solution Commonly known as: CHRONULAC Take 30 mLs by mouth daily as needed for mild constipation, moderate constipation or severe constipation.   Narcan 4 MG/0.1ML Liqd nasal spray kit Generic drug: naloxone Place 1 spray into the nose as needed (accidental overdose).   nystatin-triamcinolone cream Commonly known as: MYCOLOG II Apply topically 2 (two) times daily.   ondansetron 8 MG disintegrating tablet Commonly known as: ZOFRAN-ODT Take 8 mg by mouth every 8 (eight) hours as needed for nausea or vomiting.   Oxycodone HCl 10 MG Tabs Take 1 tablet (10 mg total) by mouth 3 (three) times daily as needed (severe pain).   pantoprazole 20 MG tablet Commonly known as: PROTONIX Take 20 mg by mouth daily.   sevelamer carbonate 800 MG tablet Commonly known as: RENVELA Take 1,600 mg by mouth 3 (three) times daily.   SUMAtriptan 100 MG tablet Commonly known as: IMITREX Take 100 mg by mouth as directed.       Consultations: Nephrology   Procedures/Studies:  CT CHEST ABDOMEN PELVIS WO CONTRAST  Result Date: 02/03/2022 CLINICAL DATA:   Nonspecific chest pain. Shortness of breath. Diarrhea. EXAM: CT CHEST, ABDOMEN AND PELVIS WITHOUT CONTRAST TECHNIQUE: Multidetector CT imaging of the chest, abdomen and pelvis was performed following the standard protocol without IV contrast. RADIATION DOSE REDUCTION: This exam was performed according to the departmental dose-optimization program which includes automated exposure control, adjustment of the mA and/or kV according to patient size and/or use of iterative reconstruction technique. COMPARISON:  Radiograph earlier today. Chest CTA 12/05/2021, abdominopelvic CT 02/15/2021 FINDINGS: CT CHEST FINDINGS Cardiovascular: Right-sided dialysis catheter in place. Heart is upper normal in size. There is a small circumferential pericardial effusion. Mild mitral annulus calcifications. Age advanced aortic atherosclerosis. Mediastinum/Nodes: No bulky adenopathy, lack of contrast and motion limits detailed assessment. Patulous esophagus. No thyroid nodule. Lungs/Pleura: There are small bilateral pleural effusions, slightly diminished from prior CT. Mild diffuse ground-glass opacity in septal thickening typical of pulmonary edema. There is also bronchial thickening which may be congestive. Airspace disease in the lingula with air bronchograms. Additional airspace disease in the  left lower and central right lower lobes. Mild bandlike atelectasis in the right middle lobe. Musculoskeletal: There are no acute or suspicious osseous abnormalities. CT ABDOMEN PELVIS FINDINGS Hepatobiliary: No focal hepatic abnormality on this unenhanced exam. Possible layering sludge in the gallbladder. Obvious pericholecystic inflammation. No biliary dilatation. Pancreas: Grossly negative. Spleen: Unremarkable noncontrast appearance. Adrenals/Urinary Tract: No adrenal nodule. Chronic bilateral renal parenchymal atrophy. Small cysts in the right kidney. No specific imaging follow-up is recommended. Peripherally calcified cyst in the left  kidney measures 8.4 cm, similar to prior. No follow-up imaging is recommended. Nondistended urinary bladder. Stomach/Bowel: Elongated air-filled appendix courses into the right upper quadrant lateral to the liver. There is scattered colonic diverticula without diverticulitis. No colonic inflammation. No small bowel obstruction. Vascular/Lymphatic: Mild aortic atherosclerosis. No aneurysm. No bulky abdominopelvic adenopathy. Reproductive: Unremarkable appearance of the uterus. Decreased size of cystic changes in the right ovary. No suspicious adnexal mass. Other: Small amount of ascites in the mesentery, pelvis and right upper quadrant. No free air. No focal fluid collection. Generalized body wall edema. Musculoskeletal: There are no acute or suspicious osseous abnormalities. IMPRESSION: 1. Multifocal airspace disease suspicious for pneumonia, most prominent in the lingula. 2. Small bilateral pleural effusions, slightly diminished from prior CT. Mild pulmonary edema. 3. Small amount of ascites in the mesentery, pelvis and right upper quadrant. Generalized body wall edema. 4. Colonic diverticulosis without diverticulitis. Aortic Atherosclerosis (ICD10-I70.0). Electronically Signed   By: Keith Rake M.D.   On: 02/03/2022 21:25   DG Chest 2 View  Result Date: 02/03/2022 CLINICAL DATA:  Chest pain and short of breath.  Dialysis patient EXAM: CHEST - 2 VIEW COMPARISON:  Chest 12/21/2021 FINDINGS: Progression of bilateral airspace disease compatible with pulmonary edema. No significant pleural effusion. Central venous catheter tip in the right atrium unchanged. IMPRESSION: Progression of bilateral airspace disease compatible with pulmonary edema. Electronically Signed   By: Franchot Gallo M.D.   On: 02/03/2022 14:35     Subjective: - no chest pain, shortness of breath, no abdominal pain, nausea or vomiting.   Discharge Exam: BP (!) 152/71 (BP Location: Right Wrist)   Pulse 94   Temp 98.9 F (37.2 C)  (Oral)   Resp (!) 26   Ht _0  (1.651 m)   Wt 87 kg   SpO2 100%   BMI 31.92 kg/m   General: Pt is alert, awake, not in acute distress Cardiovascular: RRR, S1/S2 +, no rubs, no gallops Respiratory: CTA bilaterally, no wheezing, no rhonchi Abdominal: Soft, NT, ND, bowel sounds + Extremities: no edema, no cyanosis    The results of significant diagnostics from this hospitalization (including imaging, microbiology, ancillary and laboratory) are listed below for reference.     Microbiology: Recent Results (from the past 240 hour(s))  Resp Panel by RT-PCR (Flu A&B, Covid) Anterior Nasal Swab     Status: None   Collection Time: 02/03/22  6:11 PM   Specimen: Anterior Nasal Swab  Result Value Ref Range Status   SARS Coronavirus 2 by RT PCR NEGATIVE NEGATIVE Final    Comment: (NOTE) SARS-CoV-2 target nucleic acids are NOT DETECTED.  The SARS-CoV-2 RNA is generally detectable in upper respiratory specimens during the acute phase of infection. The lowest concentration of SARS-CoV-2 viral copies this assay can detect is 138 copies/mL. A negative result does not preclude SARS-Cov-2 infection and should not be used as the sole basis for treatment or other patient management decisions. A negative result may occur with  improper specimen collection/handling, submission of  specimen other than nasopharyngeal swab, presence of viral mutation(s) within the areas targeted by this assay, and inadequate number of viral copies(<138 copies/mL). A negative result must be combined with clinical observations, patient history, and epidemiological information. The expected result is Negative.  Fact Sheet for Patients:  EntrepreneurPulse.com.au  Fact Sheet for Healthcare Providers:  IncredibleEmployment.be  This test is no t yet approved or cleared by the [place] FDA and  has been authorized for detection and/or diagnosis of SARS-CoV-2 by FDA under an  Emergency Use Authorization (EUA). This EUA will remain  in effect (meaning this test can be used) for the duration of the COVID-19 declaration under Section 564(b)(1) of the Act, 21 U.S.C.section 360bbb-3(b)(1), unless the authorization is terminated  or revoked sooner.       Influenza A by PCR NEGATIVE NEGATIVE Final   Influenza B by PCR NEGATIVE NEGATIVE Final    Comment: (NOTE) The Xpert Xpress SARS-CoV-2/FLU/RSV plus assay is intended as an aid in the diagnosis of influenza from Nasopharyngeal swab specimens and should not be used as a sole basis for treatment. Nasal washings and aspirates are unacceptable for Xpert Xpress SARS-CoV-2/FLU/RSV testing.  Fact Sheet for Patients: EntrepreneurPulse.com.au  Fact Sheet for Healthcare Providers: IncredibleEmployment.be  This test is not yet approved or cleared by the [place] FDA and has been authorized for detection and/or diagnosis of SARS-CoV-2 by FDA under an Emergency Use Authorization (EUA). This EUA will remain in effect (meaning this test can be used) for the duration of the COVID-19 declaration under Section 564(b)(1) of the Act, 21 U.S.C. section 360bbb-3(b)(1), unless the authorization is terminated or revoked.  Performed at [facility], [place] [address] [place], [place] [address]   Culture, blood (Routine X 2) w Reflex to ID Panel     Status: None (Preliminary result)   Collection Time: 02/03/22 11:40 PM   Specimen: BLOOD  Result Value Ref Range Status   Specimen Description BLOOD RIGHT ANTECUBITAL  Final   Special Requests   Final    BOTTLES DRAWN AEROBIC AND ANAEROBIC Blood Culture adequate volume   Culture   Final    NO GROWTH < 12 HOURS Performed at [facility], [place] [address] [place], [place] [address]    Report Status PENDING  Incomplete  Culture, blood (Routine X 2) w Reflex to ID Panel     Status: None (Preliminary result)   Collection Time: 02/03/22  11:45 PM   Specimen: BLOOD  Result Value Ref Range Status   Specimen Description BLOOD RIGHT ANTECUBITAL  Final   Special Requests   Final    BOTTLES DRAWN AEROBIC AND ANAEROBIC Blood Culture adequate volume   Culture   Final    NO GROWTH < 12 HOURS Performed at [facility], [place] [address], [place], [place] [address]    Report Status PENDING  Incomplete     Labs: Basic Metabolic Panel: Recent Labs  Lab 02/03/22 1714 02/03/22 1906  NA 139 143  K 5.4* 4.6  CL  --  101  CO2  --  24  GLUCOSE  --  102*  BUN  --  90*  CREATININE  --  14.01*  CALCIUM  --  9.2   Liver Function Tests: Recent Labs  Lab 02/03/22 1906  AST 24  ALT 40  ALKPHOS 47  BILITOT 0.5  PROT 7.1  ALBUMIN 3.2*   CBC: Recent Labs  Lab 02/03/22 1708 02/03/22 1714 02/04/22 0213  WBC 6.5  --  7.8  NEUTROABS 4.9  --  5.3  HGB 9.1* 9.9* 8.6*  HCT 29.7* 29.0* 29.7*  MCV 83.9  --  86.8  PLT 180  --  178   CBG: No results for input(s): "GLUCAP" in the last 168 hours. Hgb A1c No results for input(s): "HGBA1C" in the last 72 hours. Lipid Profile No results for input(s): "CHOL", "HDL", "LDLCALC", "TRIG", "CHOLHDL", "LDLDIRECT" in the last 72 hours. Thyroid function studies No results for input(s): "TSH", "T4TOTAL", "T3FREE", "THYROIDAB" in the last 72 hours.  Invalid input(s): "FREET3" Urinalysis    Component Value Date/Time   COLORURINE YELLOW 12/20/2013 0530   APPEARANCEUR CLOUDY (A) 12/20/2013 0530   LABSPEC 1.009 12/20/2013 0530   PHURINE 8.0 12/20/2013 0530   GLUCOSEU 100 (A) 12/20/2013 0530   HGBUR LARGE (A) 12/20/2013 0530   BILIRUBINUR NEGATIVE 12/20/2013 0530   KETONESUR NEGATIVE 12/20/2013 0530   PROTEINUR 100 (A) 12/20/2013 0530   UROBILINOGEN 0.2 12/20/2013 0530   NITRITE NEGATIVE 12/20/2013 0530   LEUKOCYTESUR TRACE (A) 12/20/2013 0530    FURTHER DISCHARGE INSTRUCTIONS:   Get Medicines reviewed and adjusted: Please take all your medications with you for your next  visit with your Primary MD   Laboratory/radiological data: Please request your Primary MD to go over all hospital tests and procedure/radiological results at the follow up, please ask your Primary MD to get all Hospital records sent to his/her office.   In some cases, they will be blood work, cultures and biopsy results pending at the time of your discharge. Please request that your primary care M.D. goes through all the records of your hospital data and follows up on these results.   Also Note the following: If you experience worsening of your admission symptoms, develop shortness of breath, life threatening emergency, suicidal or homicidal thoughts you must seek medical attention immediately by calling 911 or calling your MD immediately  if symptoms less severe.   You must read complete instructions/literature along with all the possible adverse reactions/side effects for all the Medicines you take and that have been prescribed to you. Take any new Medicines after you have completely understood and accpet all the possible adverse reactions/side effects.    Do not drive when taking Pain medications or sleeping medications (Benzodaizepines)   Do not take more than prescribed Pain, Sleep and Anxiety Medications. It is not advisable to combine anxiety,sleep and pain medications without talking with your primary care practitioner   Special Instructions: If you have smoked or chewed Tobacco  in the last 2 yrs please stop smoking, stop any regular Alcohol  and or any Recreational drug use.   Wear Seat belts while driving.   Please note: You were cared for by a hospitalist during your hospital stay. Once you are discharged, your primary care physician will handle any further medical issues. Please note that NO REFILLS for any discharge medications will be authorized once you are discharged, as it is imperative that you return to your primary care physician (or establish a relationship with a primary  care physician if you do not have one) for your post hospital discharge needs so that they can reassess your need for medications and monitor your lab values.  Time coordinating discharge: 35 minutes  SIGNED:  Marzetta Board, MD, PhD 02/04/2022, 2:55 PM

## 2022-02-04 NOTE — Care Management CC44 (Signed)
Condition Code 44 Documentation Completed  Patient Details  Name: Desiree [name] MRN: [id number] Date of Birth: April 28, 1975   Condition Code 44 given:  Yes Patient signature on Condition Code 44 notice:  Yes Documentation of 2 MD's agreement:  Yes Code 44 added to claim:  Yes    Fuller Mandril, RN 02/04/2022, 3:32 PM

## 2022-02-04 NOTE — Discharge Instructions (Signed)
Follow with Harvie Junior, MD in 1-2 weeks  Follow up with outpatient HD as scheduled  Please get a complete blood count and chemistry panel checked by your Primary MD at your next visit, and again as instructed by your Primary MD. Please get your medications reviewed and adjusted by your Primary MD.  Please request your Primary MD to go over all Hospital Tests and Procedure/Radiological results at the follow up, please get all Hospital records sent to your Prim MD by signing hospital release before you go home.  In some cases, there will be blood work, cultures and biopsy results pending at the time of your discharge. Please request that your primary care M.D. goes through all the records of your hospital data and follows up on these results.  If you had Pneumonia of Lung problems at the Hospital: Please get a 2 view Chest X ray done in 6-8 weeks after hospital discharge or sooner if instructed by your Primary MD.  If you have Congestive Heart Failure: Please call your Cardiologist or Primary MD anytime you have any of the following symptoms:  1) 3 pound weight gain in 24 hours or 5 pounds in 1 week  2) shortness of breath, with or without a dry hacking cough  3) swelling in the hands, feet or stomach  4) if you have to sleep on extra pillows at night in order to breathe  Follow cardiac low salt diet and 1.5 lit/day fluid restriction.  If you have diabetes Accuchecks 4 times/day, Once in AM empty stomach and then before each meal. Log in all results and show them to your primary doctor at your next visit. If any glucose reading is under 80 or above 300 call your primary MD immediately.  If you have Seizure/Convulsions/Epilepsy: Please do not drive, operate heavy machinery, participate in activities at heights or participate in high speed sports until you have seen by Primary MD or a Neurologist and advised to do so again. Per [facility] statutes, patients with seizures are  not allowed to drive until they have been seizure-free for six months.  Use caution when using heavy equipment or power tools. Avoid working on ladders or at heights. Take showers instead of baths. Ensure the water temperature is not too high on the home water heater. Do not go swimming alone. Do not lock yourself in a room alone (i.e. bathroom). When caring for infants or small children, sit down when holding, feeding, or changing them to minimize risk of injury to the child in the event you have a seizure. Maintain good sleep hygiene. Avoid alcohol.   If you had Gastrointestinal Bleeding: Please ask your Primary MD to check a complete blood count within one week of discharge or at your next visit. Your endoscopic/colonoscopic biopsies that are pending at the time of discharge, will also need to followed by your Primary MD.  Get Medicines reviewed and adjusted. Please take all your medications with you for your next visit with your Primary MD  Please request your Primary MD to go over all hospital tests and procedure/radiological results at the follow up, please ask your Primary MD to get all Hospital records sent to his/her office.  If you experience worsening of your admission symptoms, develop shortness of breath, life threatening emergency, suicidal or homicidal thoughts you must seek medical attention immediately by calling 911 or calling your MD immediately  if symptoms less severe.  You must read complete instructions/literature along with all the possible  adverse reactions/side effects for all the Medicines you take and that have been prescribed to you. Take any new Medicines after you have completely understood and accpet all the possible adverse reactions/side effects.   Do not drive or operate heavy machinery when taking Pain medications.   Do not take more than prescribed Pain, Sleep and Anxiety Medications  Special Instructions: If you have smoked or chewed Tobacco  in the last 2 yrs  please stop smoking, stop any regular Alcohol  and or any Recreational drug use.  Wear Seat belts while driving.  Please note You were cared for by a hospitalist during your hospital stay. If you have any questions about your discharge medications or the care you received while you were in the hospital after you are discharged, you can call the unit and asked to speak with the hospitalist on call if the hospitalist that took care of you is not available. Once you are discharged, your primary care physician will handle any further medical issues. Please note that NO REFILLS for any discharge medications will be authorized once you are discharged, as it is imperative that you return to your primary care physician (or establish a relationship with a primary care physician if you do not have one) for your aftercare needs so that they can reassess your need for medications and monitor your lab values.  You can reach the hospitalist office at phone [phone] or fax [phone]   If you do not have a primary care physician, you can call [phone] for a physician referral.  Activity: As tolerated with Full fall precautions use walker/cane & assistance as needed    Diet: renal  Disposition Home

## 2022-02-05 LAB — HEPATITIS B SURFACE ANTIBODY, QUANTITATIVE: Hep B S AB Quant (Post): 74.5 m[IU]/mL (ref >9.9)

## 2022-02-09 LAB — CULTURE, BLOOD (ROUTINE X 2)
Culture: NO GROWTH
Culture: NO GROWTH
Special Requests: ADEQUATE
Special Requests: ADEQUATE

## 2022-02-11 ENCOUNTER — Ambulatory Visit (INDEPENDENT_AMBULATORY_CARE_PROVIDER_SITE_OTHER): Payer: 59 | Admitting: Physician Assistant

## 2022-02-11 VITALS — BP 147/81 | HR 81 | Temp 98.5°F | Ht 65.0 in | Wt 191.0 lb

## 2022-02-11 DIAGNOSIS — N186 End stage renal disease: Secondary | ICD-10-CM

## 2022-02-11 DIAGNOSIS — Z992 Dependence on renal dialysis: Secondary | ICD-10-CM

## 2022-02-11 NOTE — Progress Notes (Signed)
POST OPERATIVE OFFICE NOTE    CC:  F/u for surgery  HPI:  Desiree [name] is a 46 year old female who is s/p placement of right IJ TDC and excision of infected skin with repair of AV fistula buttonhole of a left AV fistula on 12/19/2021 by Dr. Donzetta Matters.  At her last follow-up appointment with [place], her left upper arm staples were removed and incision was looking well.  She was told she could use her fistula by 10/19/2021 and follow-up with [place] as needed.  The patient rescheduled another follow-up appointment with [place] due to concerns for skin darkening around the fistula.  She states about 2 weeks ago she had some swelling in her left upper arm and "blackening" of the skin around her fistula.  This went away after a couple of days.  She also noted her fistula dilating in size.  She wanted to make sure that her fistula was safe to stick and would not get infected again.   At this time she denies any recent arm swelling or discoloration.  She denies any arm pain.  She denies any fever or chills.  She endorses some occasional left hand numbness, but no left hand pain or coldness.  She is still dialyzing via right IJ TDC on Tuesdays, Thursdays, and Saturdays at [facility] in [place].   Allergies  Allergen Reactions   Morphine Shortness Of Breath and Anaphylaxis   Prednisone Other (See Comments)    Other reaction(s): Other (See Comments) Muscle spasms Patient says prednisone causes her to cramp all over, muscle spasms uncontrolled   Tuna [Fish Allergy] Itching, Swelling, Rash and Other (See Comments)    Face droops also   Amlodipine Other (See Comments)    Angioedema (09/05/17 ED visit)   Iodinated Contrast Media Itching   Tape Itching    Adhesive tape   paper tape ok    Current Outpatient Medications  Medication Sig Dispense Refill   albuterol (PROVENTIL HFA;VENTOLIN HFA) 108 (90 Base) MCG/ACT inhaler Inhale 2 puffs into the lungs every 4 (four) hours as needed for wheezing or shortness of breath.  1 Inhaler 2   ALPRAZolam (XANAX) 1 MG tablet Take 1 mg by mouth 3 (three) times daily as needed.     amoxicillin-clavulanate (AUGMENTIN) 875-125 MG tablet Take 1 tablet by mouth 2 (two) times daily. 6 tablet 0   aspirin EC 81 MG tablet Take 81 mg by mouth daily. Swallow whole.     calcium acetate (PHOSLO) 667 MG capsule Take 667-1,334 mg by mouth See admin instructions. 3 capsules with meals  1 to 2 capsules with snacks  4   cinacalcet (SENSIPAR) 60 MG tablet Take 60 mg by mouth every evening.     cyclobenzaprine (FLEXERIL) 10 MG tablet Take 10 mg by mouth 2 (two) times daily as needed for muscle spasms.     diphenhydrAMINE (BENADRYL) 25 MG tablet Take 1 tablet (25 mg total) by mouth every 6 (six) hours. (Patient taking differently: Take 25 mg by mouth every 6 (six) hours as needed for itching or allergies.) 20 tablet 0   lactulose (CHRONULAC) 10 GM/15ML solution Take 30 mLs by mouth daily as needed for mild constipation, moderate constipation or severe constipation.     NARCAN 4 MG/0.1ML LIQD nasal spray kit Place 1 spray into the nose as needed (accidental overdose).     nystatin-triamcinolone (MYCOLOG II) cream Apply topically 2 (two) times daily.     ondansetron (ZOFRAN-ODT) 8 MG disintegrating tablet Take 8 mg by mouth every  8 (eight) hours as needed for nausea or vomiting.      Oxycodone HCl 10 MG TABS Take 1 tablet (10 mg total) by mouth 3 (three) times daily as needed (severe pain). 5 tablet 0   pantoprazole (PROTONIX) 20 MG tablet Take 20 mg by mouth daily.     sevelamer carbonate (RENVELA) 800 MG tablet Take 1,600 mg by mouth 3 (three) times daily.     No current facility-administered medications for this visit.     ROS:  See HPI  Physical Exam:  Incision: Left upper arm incision well-healed with no signs of infection or dehiscence. Extremities: Left upper arm AV fistula without erythema, hematoma, or edema.  Fistula with some aneurysmal changes but no skin ulceration.  AV fistula  with palpable thrill.  Brisk left radial doppler signal Neuro: Intact motor and sensation of the left upper extremity    Assessment/Plan:  This is a 46 y.o. female who is s/p: Revision of left upper extremity AV fistula due to infected skin over buttonhole   -The patient had an episode 2 weeks ago described with left upper arm swelling and skin darkening, what sounds like a possible hematoma that has now resolved.  There are no concerns for infection or hematoma at this time. -Her left upper arm incision is well-healed with no dehiscence or infection -Her left AV fistula has a palpable thrill.  She has a brisk left radial Doppler signal as she did postoperatively -I have told the patient that her fistula is safe for use and her dialysis center can attempt to use it when she is ready.  Once that is working well for a few sessions the [facility] may be discontinued.  She can follow-up with [place] as needed   Vicente Serene, PA-C [facility] [phone]   Clinic MD:  Donzetta Matters

## 2022-02-12 ENCOUNTER — Telehealth (HOSPITAL_BASED_OUTPATIENT_CLINIC_OR_DEPARTMENT_OTHER): Payer: Self-pay

## 2022-02-12 NOTE — Telephone Encounter (Addendum)
Called results to patient and left results on VM (ok per DPR), instructions left to call office back if patient has any questions!      ----- Message from Buford Dresser, MD sent at 02/12/2022  9:18 AM EST ----- I don't think she saw this on mychart--normal function of the heart. There is a lot of calcium around the mitral valve, but the valve is functioning normally. Several of the valves have calcium, likely due to the end stage renal disease, but nothing to explain shortness of breath from a heart issue.

## 2022-03-31 ENCOUNTER — Emergency Department (HOSPITAL_COMMUNITY)
Admission: EM | Admit: 2022-03-31 | Discharge: 2022-04-01 | Disposition: A | Discharge status: Home / Self Care | Payer: 59 | Attending: Emergency Medicine | Admitting: Emergency Medicine

## 2022-03-31 ENCOUNTER — Other Ambulatory Visit: Payer: Self-pay

## 2022-03-31 ENCOUNTER — Encounter (HOSPITAL_COMMUNITY): Payer: Self-pay

## 2022-03-31 ENCOUNTER — Emergency Department (HOSPITAL_COMMUNITY): Payer: 59

## 2022-03-31 DIAGNOSIS — Z7982 Long term (current) use of aspirin: Secondary | ICD-10-CM | POA: Insufficient documentation

## 2022-03-31 DIAGNOSIS — R600 Localized edema: Secondary | ICD-10-CM | POA: Diagnosis not present

## 2022-03-31 DIAGNOSIS — Z1152 Encounter for screening for COVID-19: Secondary | ICD-10-CM | POA: Insufficient documentation

## 2022-03-31 DIAGNOSIS — R079 Chest pain, unspecified: Secondary | ICD-10-CM | POA: Diagnosis not present

## 2022-03-31 DIAGNOSIS — D649 Anemia, unspecified: Secondary | ICD-10-CM | POA: Diagnosis not present

## 2022-03-31 DIAGNOSIS — N186 End stage renal disease: Secondary | ICD-10-CM | POA: Insufficient documentation

## 2022-03-31 DIAGNOSIS — W19XXXA Unspecified fall, initial encounter: Secondary | ICD-10-CM | POA: Insufficient documentation

## 2022-03-31 DIAGNOSIS — Z992 Dependence on renal dialysis: Secondary | ICD-10-CM | POA: Diagnosis not present

## 2022-03-31 DIAGNOSIS — R609 Edema, unspecified: Secondary | ICD-10-CM | POA: Insufficient documentation

## 2022-03-31 DIAGNOSIS — R519 Headache, unspecified: Secondary | ICD-10-CM | POA: Insufficient documentation

## 2022-03-31 DIAGNOSIS — I120 Hypertensive chronic kidney disease with stage 5 chronic kidney disease or end stage renal disease: Secondary | ICD-10-CM | POA: Insufficient documentation

## 2022-03-31 DIAGNOSIS — Z79899 Other long term (current) drug therapy: Secondary | ICD-10-CM | POA: Diagnosis not present

## 2022-03-31 LAB — CBC
HCT: 32.2 % — ABNORMAL LOW (ref 36.0–46.0)
Hemoglobin: 10.1 g/dL — ABNORMAL LOW (ref 12.0–15.0)
MCH: 25 pg — ABNORMAL LOW (ref 26.0–34.0)
MCHC: 31.4 g/dL (ref 30.0–36.0)
MCV: 79.7 fL — ABNORMAL LOW (ref 80.0–100.0)
Platelets: 201 10*3/uL (ref 150–400)
RBC: 4.04 MIL/uL (ref 3.87–5.11)
RDW: 19.5 % — ABNORMAL HIGH (ref 11.5–15.5)
WBC: 4.5 10*3/uL (ref 4.0–10.5)
nRBC: 0 % (ref 0.0–0.2)

## 2022-03-31 LAB — BRAIN NATRIURETIC PEPTIDE: B Natriuretic Peptide: 623.7 pg/mL — ABNORMAL HIGH (ref 0.0–100.0)

## 2022-03-31 LAB — BASIC METABOLIC PANEL
Anion gap: 22 — ABNORMAL HIGH (ref 5–15)
BUN: 95 mg/dL — ABNORMAL HIGH (ref 6–20)
CO2: 21 mmol/L — ABNORMAL LOW (ref 22–32)
Calcium: 9.1 mg/dL (ref 8.9–10.3)
Chloride: 97 mmol/L — ABNORMAL LOW (ref 98–111)
Creatinine, Ser: 11.76 mg/dL — ABNORMAL HIGH (ref 0.44–1.00)
GFR, Estimated: 4 mL/min — ABNORMAL LOW (ref >60)
Glucose, Bld: 87 mg/dL (ref 70–99)
Potassium: 4.6 mmol/L (ref 3.5–5.1)
Sodium: 140 mmol/L (ref 135–145)

## 2022-03-31 LAB — TROPONIN I (HIGH SENSITIVITY): Troponin I (High Sensitivity): 25 ng/L — ABNORMAL HIGH (ref <18)

## 2022-03-31 MED ORDER — DIPHENHYDRAMINE HCL 25 MG PO CAPS
25.0000 mg | ORAL_CAPSULE | Freq: Once | ORAL | Status: AC
  Administered 2022-03-31: 25 mg via ORAL
  Filled 2022-03-31: qty 1

## 2022-03-31 MED ORDER — ALUM & MAG HYDROXIDE-SIMETH 200-200-20 MG/5ML PO SUSP
30.0000 mL | Freq: Once | ORAL | Status: AC
  Administered 2022-03-31: 30 mL via ORAL
  Filled 2022-03-31: qty 30

## 2022-03-31 MED ORDER — ACETAMINOPHEN 500 MG PO TABS
1000.0000 mg | ORAL_TABLET | ORAL | Status: AC
  Administered 2022-03-31: 1000 mg via ORAL
  Filled 2022-03-31: qty 2

## 2022-03-31 MED ORDER — METOCLOPRAMIDE HCL 5 MG/ML IJ SOLN
10.0000 mg | Freq: Once | INTRAMUSCULAR | Status: AC
  Administered 2022-03-31: 10 mg via INTRAVENOUS
  Filled 2022-03-31: qty 2

## 2022-03-31 NOTE — ED Provider Triage Note (Signed)
Emergency Medicine Provider Triage Evaluation Note  Desiree [name] , a 47 y.o. female  was evaluated in triage.  Pt complains of mid-sternal chest pain that started this morning with generalized headache. Reports she thinks there is too much fluid on her and her dialysis is not working. Did not report to dialysis today due to symptoms. Dialyzes TRSat. States she has otherwise attended recently. States she took none of her medications today "because I was coming here instead." No fever, chills, nausea, vomiting, abdominal pain. Reports history of requiring 3L [place] O2 at home but has weaned off in last 2 months and breathing is baseline. States sent by nephrologist "to be admitted for observation. No radiating chest pain, diaphoresis, lightheadedness, dizziness, focal weakness. Reports her legs were in so much pain this morning she fell to ground but did not hit her head.   Review of Systems  Positive: See HPI Negative: See HPI  Physical Exam  BP (!) 154/77 (BP Location: Right Arm)   Pulse 83   Temp 98.5 F (36.9 C)   Resp 18   SpO2 100%  Gen:   Awake, no distress   Resp:  Normal effort LCTA MSK:   Moves extremities without difficulty  Other:  RRR, bilateral trace LE edema and tenderness to anterior tib-fib, abdomen soft and nontender, no chest wall tenderness, pt tearful   Medical Decision Making  Medically screening exam initiated at 5:27 PM.  Appropriate orders placed.  Desiree [name] was informed that the remainder of the evaluation will be completed by another provider, this initial triage assessment does not replace that evaluation, and the importance of remaining in the ED until their evaluation is complete.     Desiree Righter, PA-C 03/31/22 1731

## 2022-03-31 NOTE — ED Provider Notes (Signed)
[facility] EMERGENCY DEPARTMENT Provider Note   CSN: [id number] Arrival date & time: 03/31/22  1635     History  Chief Complaint  Patient presents with   Edema   Chest Pain   Headache    Desiree [name] is a 47 y.o. female.   Chest Pain Associated symptoms: headache   Headache  Patient is a 47 year old female past medical history significant for ESRD on dialysis Saturday last night, no longer makes urine, reflux, anemia requiring blood transfusions, depression, migraines, anxiety, hypertension, seizures, obesity, chronic respiratory failure with hypoxia on 3 L at baseline weaned down recently to room air.  Patient is presented emergency room today with complaints of 3+ months of not feeling well.  She feels that she has been generally more fatigued and unwell.  She states that she has had multiple respiratory illnesses over the past few months.  She indicates that she thinks she may have had the flu at 1 point and also states that she was diagnosed with pneumonia within this period of time as well.  Seems that she is over the past few days experience another respiratory illness she describes some cough congestion fatigue.  She also endorses a headache which she states is her primary pain today she describes it as a stabbing pain.  She endorses some nausea without emesis.  She endorses additionally some leg heaviness that is affecting both of her legs the extent that she feels that she is having trouble walking.  She does not walk with a walker or cane but rather ambulates without difficulty at baseline she states.  She also endorses some chest pain that began this morning she indicates that it is midsternal.  It seemed to come on this morning along with a headache.  Patient states that she feels that she has so much fluid on her.  She indicates that her lower extremity edema as well as some buttocks edema.  She has not taken any of her medications today.  She missed her  dialysis session that was today as well.  She denies any fever, vomiting, abdominal pain.  No vertigo lightheadedness or dizziness no slurred speech or confusion.  No vision changes.  She states she fell this morning, further questioning it seems that she did not strike her head and it was more of a slump to the ground.     Home Medications Prior to Admission medications   Medication Sig Start Date End Date Taking? Authorizing Provider  albuterol (PROVENTIL HFA;VENTOLIN HFA) 108 (90 Base) MCG/ACT inhaler Inhale 2 puffs into the lungs every 4 (four) hours as needed for wheezing or shortness of breath. 05/02/16   Forde Dandy, MD  ALPRAZolam Duanne Moron) 1 MG tablet Take 1 mg by mouth 3 (three) times daily as needed. 11/25/21   [provider]  amoxicillin-clavulanate (AUGMENTIN) 875-125 MG tablet Take 1 tablet by mouth 2 (two) times daily. 02/04/22   Caren Griffins, MD  aspirin EC 81 MG tablet Take 81 mg by mouth daily. Swallow whole.    [provider]  calcium acetate (PHOSLO) 667 MG capsule Take 667-1,334 mg by mouth See admin instructions. 3 capsules with meals  1 to 2 capsules with snacks 11/30/17   [provider]  cinacalcet (SENSIPAR) 60 MG tablet Take 60 mg by mouth every evening.    [provider]  cyclobenzaprine (FLEXERIL) 10 MG tablet Take 10 mg by mouth 2 (two) times daily as needed for muscle spasms.  [provider]  diphenhydrAMINE (BENADRYL) 25 MG tablet Take 1 tablet (25 mg total) by mouth every 6 (six) hours. Patient taking differently: Take 25 mg by mouth every 6 (six) hours as needed for itching or allergies. 09/05/17   Domenic Moras, PA-C  lactulose (CHRONULAC) 10 GM/15ML solution Take 30 mLs by mouth daily as needed for mild constipation, moderate constipation or severe constipation. 09/04/21   [provider]  NARCAN 4 MG/0.1ML LIQD nasal spray kit Place 1 spray into the nose as needed (accidental overdose). 11/14/18    [provider]  nystatin-triamcinolone (MYCOLOG II) cream Apply topically 2 (two) times daily. 09/09/21   [provider]  ondansetron (ZOFRAN-ODT) 8 MG disintegrating tablet Take 8 mg by mouth every 8 (eight) hours as needed for nausea or vomiting.     [provider]  Oxycodone HCl 10 MG TABS Take 1 tablet (10 mg total) by mouth 3 (three) times daily as needed (severe pain). 12/22/21   Arrien, Jimmy Picket, MD  pantoprazole (PROTONIX) 20 MG tablet Take 20 mg by mouth daily. 11/25/21   [provider]  sevelamer carbonate (RENVELA) 800 MG tablet Take 1,600 mg by mouth 3 (three) times daily. 05/18/19   [provider]      Allergies    Morphine, Prednisone, Blain Pais allergy], Amlodipine, Iodinated contrast media, and Tape    Review of Systems   Review of Systems  Cardiovascular:  Positive for chest pain.  Neurological:  Positive for headaches.    Physical Exam Updated Vital Signs BP (!) 153/75   Pulse 88   Temp 98.1 F (36.7 C) (Oral)   Resp 20   SpO2 99%  Physical Exam Vitals and nursing note reviewed.  Constitutional:      General: She is not in acute distress.    Appearance: She is not ill-appearing, toxic-appearing or diaphoretic.     Comments: Chronically unwell 47 year old woman  HENT:     Head: Normocephalic and atraumatic.     Nose: Nose normal.     Mouth/Throat:     Mouth: Mucous membranes are moist.  Eyes:     General: No scleral icterus. Cardiovascular:     Rate and Rhythm: Normal rate and regular rhythm.     Pulses: Normal pulses.     Heart sounds: Normal heart sounds.     Comments: LUE fistula w thrill Pulmonary:     Effort: Pulmonary effort is normal. No respiratory distress.     Breath sounds: Normal breath sounds. No wheezing.  Abdominal:     Palpations: Abdomen is soft.     Tenderness: There is no abdominal tenderness. There is no guarding or rebound.  Musculoskeletal:     Cervical back: Normal range of  motion.     Right lower leg: Edema present.     Left lower leg: Edema present.     Comments: Trace BL symmetric ankle edema   Skin:    General: Skin is warm and dry.     Capillary Refill: Capillary refill takes less than 2 seconds.  Neurological:     Mental Status: She is alert. Mental status is at baseline.  Psychiatric:        Mood and Affect: Mood normal.        Behavior: Behavior normal.     ED Results / Procedures / Treatments   Labs (all labs ordered are listed, but only abnormal results are displayed) Labs Reviewed  BASIC METABOLIC PANEL - Abnormal; Notable  for the following components:      Result Value   Chloride 97 (*)    CO2 21 (*)    BUN 95 (*)    Creatinine, Ser 11.76 (*)    GFR, Estimated 4 (*)    Anion gap 22 (*)    All other components within normal limits  CBC - Abnormal; Notable for the following components:   Hemoglobin 10.1 (*)    HCT 32.2 (*)    MCV 79.7 (*)    MCH 25.0 (*)    RDW 19.5 (*)    All other components within normal limits  BRAIN NATRIURETIC PEPTIDE - Abnormal; Notable for the following components:   B Natriuretic Peptide 623.7 (*)    All other components within normal limits  TROPONIN I (HIGH SENSITIVITY) - Abnormal; Notable for the following components:   Troponin I (High Sensitivity) 25 (*)    All other components within normal limits  TROPONIN I (HIGH SENSITIVITY) - Abnormal; Notable for the following components:   Troponin I (High Sensitivity) 27 (*)    All other components within normal limits  RESP PANEL BY RT-PCR (RSV, FLU A&B, COVID)  [id number]    EKG EKG Interpretation  Date/Time:  Tuesday March 31 2022 17:16:15 EST Ventricular Rate:  79 PR Interval:  116 QRS Duration: 70 QT Interval:  406 QTC Calculation: 465 R Axis:   59 Text Interpretation: Normal sinus rhythm Cannot rule out Anterior infarct , age undetermined Abnormal ECG When compared with ECG of 03-Feb-2022 13:25, No significant change since last tracing  Confirmed by Gareth Morgan [phone]) on 03/31/2022 9:32:27 PM  Radiology DG Chest 1 View  Result Date: 03/31/2022 CLINICAL DATA:  Chest pain EXAM: CHEST  1 VIEW COMPARISON:  None Available. FINDINGS: Unchanged cardiomegaly. Both lungs are clear. No pleural effusion or pneumothorax. IMPRESSION: No active disease. Electronically Signed   By: Yetta Glassman M.D.   On: 03/31/2022 18:40    Procedures Procedures    Medications Ordered in ED Medications  metoCLOPramide (REGLAN) injection 10 mg (10 mg Intravenous Given 03/31/22 2314)  diphenhydrAMINE (BENADRYL) capsule 25 mg (25 mg Oral Given 03/31/22 2313)  acetaminophen (TYLENOL) tablet 1,000 mg (1,000 mg Oral Given 03/31/22 2313)  alum & mag hydroxide-simeth (MAALOX/MYLANTA) 200-200-20 MG/5ML suspension 30 mL (30 mLs Oral Given 03/31/22 2315)  LORazepam (ATIVAN) tablet 1 mg (1 mg Oral Given 04/01/22 0406)    ED Course/ Medical Decision Making/ A&P Clinical Course as of 04/01/22 0745  Wed Apr 01, 2022  0637 Discussed with Dr. Moshe Cipro who is deliberating whether outpatient dialysis versus dialysis here in hospital is appropriate.  She is planning to contact dialysis center. [WF]    Clinical Course User Index [WF] Tedd Sias, [place]                             Medical Decision Making Risk OTC drugs. Prescription drug management.   This patient presents to the ED for concern of fatigue, this involves a number of treatment options, and is a complaint that carries with it a high risk of complications and morbidity. A differential diagnosis was considered for the patient's symptoms which is discussed below:   The differential diagnosis of weakness includes but is not limited to neurologic causes (GBS, myasthenia gravis, CVA, MS, ALS, transverse myelitis, spinal cord injury, CVA, botulism, ) and other causes: ACS, Arrhythmia, syncope, orthostatic hypotension, sepsis, hypoglycemia, electrolyte disturbance, hypothyroidism, respiratory  failure, symptomatic anemia,  dehydration, heat injury, polypharmacy, malignancy.    Co morbidities: Discussed in HPI   Brief History:  Patient is a 47 year old female past medical history significant for ESRD on dialysis Saturday last night, no longer makes urine, reflux, anemia requiring blood transfusions, depression, migraines, anxiety, hypertension, seizures, obesity, chronic respiratory failure with hypoxia on 3 L at baseline weaned down recently to room air.  Patient is presented emergency room today with complaints of 3+ months of not feeling well.  She feels that she has been generally more fatigued and unwell.  She states that she has had multiple respiratory illnesses over the past few months.  She indicates that she thinks she may have had the flu at 1 point and also states that she was diagnosed with pneumonia within this period of time as well.  Seems that she is over the past few days experience another respiratory illness she describes some cough congestion fatigue.  She also endorses a headache which she states is her primary pain today she describes it as a stabbing pain.  She endorses some nausea without emesis.  She endorses additionally some leg heaviness that is affecting both of her legs the extent that she feels that she is having trouble walking.  She does not walk with a walker or cane but rather ambulates without difficulty at baseline she states.  She also endorses some chest pain that began this morning she indicates that it is midsternal.  It seemed to come on this morning along with a headache.  Patient states that she feels that she has so much fluid on her.  She indicates that her lower extremity edema as well as some buttocks edema.  She has not taken any of her medications today.  She missed her dialysis session that was today as well.  She denies any fever, vomiting, abdominal pain.  No vertigo lightheadedness or dizziness no slurred speech or confusion.  No vision  changes.  She states she fell this morning, further questioning it seems that she did not strike her head and it was more of a slump to the ground.    EMR reviewed including pt PMHx, past surgical history and past visits to ER.   See HPI for more details   Lab Tests:   I ordered and independently interpreted labs. Labs notable for CBC with anemia but better than baseline.  BMP with significantly elevated BUN of 95 anion gap of 22 likely secondary to BUN.  BN P mildly elevated but in the setting of this particular patient's this is not particularly helpful.  Troponin flat mildly elevated again this is an ESRD patient.  COVID influenza RSV negative.  Imaging Studies:  NAD. I personally reviewed all imaging studies and no acute abnormality found. I agree with radiology interpretation.    Cardiac Monitoring:  NA EKG non-ischemic   Medicines ordered:  I ordered medication including Ativan, GI cocktail, Reglan, Benadryl, Tylenol for nausea vomiting headache Reevaluation of the patient after these medicines showed that the patient improved I have reviewed the patients home medicines and have made adjustments as needed   Critical Interventions:     Consults/Attending Physician   I requested consultation with Dr. Moshe Cipro of nephrology,  and discussed lab and imaging findings as well as pertinent plan - they recommend: Dr. Moshe Cipro will make sure the patient receives dialysis today whether an outpatient dialysis setting versus dialysis here in the hospital.   Reevaluation:  After the interventions noted above I re-evaluated patient and found  that they have :stayed the same   Social Determinants of Health:      Problem List / ED Course:  Patient currently asleep.  Awaiting dialysis.  I anticipate that this patient can be discharged home after dialysis.  Apart from her hypovolemia and uremia she does not have any critical findings  today.   Dispostion:    Final Clinical Impression(s) / ED Diagnoses Final diagnoses:  None    Rx / DC Orders ED Discharge Orders     None         Tedd Sias, [place] 04/01/22 0745    Quintella Reichert, MD 04/03/22 1723

## 2022-03-31 NOTE — ED Provider Notes (Incomplete)
[facility] EMERGENC[facility] DEPARTMENT Provider Note   CSN: [id number] Arrival date & time: 03/31/22  1635     History {Add pertinent medical, surgical, social history, OB history to HPI:1} Chief Complaint  Patient presents with  . Edema  . Chest Pain  . Headache    Desiree [name] is a 47 y.o. female.   Chest Pain Associated symptoms: headache   Headache  Patient is a 47 year old female past medical history significant for ESRD on dialysis Saturday last night, no longer makes urine, reflux, anemia requiring blood transfusions, depression, migraines, anxiety, hypertension, seizures, obesity, chronic respiratory failure with hypoxia on 3 L at baseline weaned down recently to room air.  Patient is presented emergency room today with complaints of 3+ months of not feeling well.  She feels that she has been generally more fatigued and unwell.  She states that she has had multiple respiratory illnesses over the past few months.  She indicates that she thinks she may have had the flu at 1 point and also states that she was diagnosed with pneumonia within this period of time as well.  Seems that she is over the past few days experience another respiratory illness she describes some cough congestion fatigue.  She also endorses a headache which she states is her primary pain today she describes it as a stabbing pain.  She endorses some nausea without emesis.  She endorses additionally some leg heaviness that is affecting both of her legs the extent that she feels that she is having trouble walking.  She does not walk with a walker or cane but rather ambulates without difficulty at baseline she states.  She also endorses some chest pain that began this morning she indicates that it is midsternal.  It seemed to come on this morning along with a headache.  Patient states that she feels that she has so much fluid on her.  She indicates that her lower extremity edema as well as some  buttocks edema.  She has not taken any of her medications today.  She missed her dialysis session that was today as well.  She denies any fever, vomiting, abdominal pain.  No vertigo lightheadedness or dizziness no slurred speech or confusion.  No vision changes.  She states she fell this morning, further questioning it seems that she did not strike her head and it was more of a slump to the ground.     Home Medications Prior to Admission medications   Medication Sig Start Date End Date Taking? Authorizing Provider  albuterol (PROVENTIL HFA;VENTOLIN HFA) 108 (90 Base) MCG/ACT inhaler Inhale 2 puffs into the lungs every 4 (four) hours as needed for wheezing or shortness of breath. 05/02/16   Forde Dandy, MD  ALPRAZolam Duanne Moron) 1 MG tablet Take 1 mg by mouth 3 (three) times daily as needed. 11/25/21   [provider]  amoxicillin-clavulanate (AUGMENTIN) 875-125 MG tablet Take 1 tablet by mouth 2 (two) times daily. 02/04/22   Caren Griffins, MD  aspirin EC 81 MG tablet Take 81 mg by mouth daily. Swallow whole.    [provider]  calcium acetate (PHOSLO) 667 MG capsule Take 667-1,334 mg by mouth See admin instructions. 3 capsules with meals  1 to 2 capsules with snacks 11/30/17   [provider]  cinacalcet (SENSIPAR) 60 MG tablet Take 60 mg by mouth every evening.    [provider]  cyclobenzaprine (FLEXERIL) 10 MG tablet Take 10 mg by mouth 2 (two)  times daily as needed for muscle spasms.    [provider]  diphenhydrAMINE (BENADRYL) 25 MG tablet Take 1 tablet (25 mg total) by mouth every 6 (six) hours. Patient taking differently: Take 25 mg by mouth every 6 (six) hours as needed for itching or allergies. 09/05/17   Domenic Moras, PA-C  lactulose (CHRONULAC) 10 GM/15ML solution Take 30 mLs by mouth daily as needed for mild constipation, moderate constipation or severe constipation. 09/04/21   [provider]  NARCAN 4 MG/0.1ML LIQD nasal  spray kit Place 1 spray into the nose as needed (accidental overdose). 11/14/18   [provider]  nystatin-triamcinolone (MYCOLOG II) cream Apply topically 2 (two) times daily. 09/09/21   [provider]  ondansetron (ZOFRAN-ODT) 8 MG disintegrating tablet Take 8 mg by mouth every 8 (eight) hours as needed for nausea or vomiting.     [provider]  Oxycodone HCl 10 MG TABS Take 1 tablet (10 mg total) by mouth 3 (three) times daily as needed (severe pain). 12/22/21   Arrien, Jimmy Picket, MD  pantoprazole (PROTONIX) 20 MG tablet Take 20 mg by mouth daily. 11/25/21   [provider]  sevelamer carbonate (RENVELA) 800 MG tablet Take 1,600 mg by mouth 3 (three) times daily. 05/18/19   [provider]      Allergies    Morphine, Prednisone, Blain Pais allergy], Amlodipine, Iodinated contrast media, and Tape    Review of Systems   Review of Systems  Cardiovascular:  Positive for chest pain.  Neurological:  Positive for headaches.    Physical Exam Updated Vital Signs BP (!) 143/67   Pulse 82   Temp 98.6 F (37 C)   Resp 15   SpO2 100%  Physical Exam Vitals and nursing note reviewed.  Constitutional:      General: She is not in acute distress.    Appearance: She is not ill-appearing, toxic-appearing or diaphoretic.     Comments: Chronically unwell 47 year old woman  HENT:     Head: Normocephalic and atraumatic.     Nose: Nose normal.     Mouth/Throat:     Mouth: Mucous membranes are moist.  Eyes:     General: No scleral icterus. Cardiovascular:     Rate and Rhythm: Normal rate and regular rhythm.     Pulses: Normal pulses.     Heart sounds: Normal heart sounds.     Comments: LUE fistula w thrill Pulmonary:     Effort: Pulmonary effort is normal. No respiratory distress.     Breath sounds: Normal breath sounds. No wheezing.  Abdominal:     Palpations: Abdomen is soft.     Tenderness: There is no abdominal tenderness. There is no  guarding or rebound.  Musculoskeletal:     Cervical back: Normal range of motion.     Right lower leg: Edema present.     Left lower leg: Edema present.     Comments: Trace BL symmetric ankle edema   Skin:    General: Skin is warm and dry.     Capillary Refill: Capillary refill takes less than 2 seconds.  Neurological:     Mental Status: She is alert. Mental status is at baseline.  Psychiatric:        Mood and Affect: Mood normal.        Behavior: Behavior normal.     ED Results / Procedures / Treatments   Labs (all labs ordered are listed, but only abnormal results are displayed)  Labs Reviewed  BASIC METABOLIC PANEL - Abnormal; Notable for the following components:      Result Value   Chloride 97 (*)    CO2 21 (*)    BUN 95 (*)    Creatinine, Ser 11.76 (*)    GFR, Estimated 4 (*)    Anion gap 22 (*)    All other components within normal limits  CBC - Abnormal; Notable for the following components:   Hemoglobin 10.1 (*)    HCT 32.2 (*)    MCV 79.7 (*)    MCH 25.0 (*)    RDW 19.5 (*)    All other components within normal limits  BRAIN NATRIURETIC PEPTIDE - Abnormal; Notable for the following components:   B Natriuretic Peptide 623.7 (*)    All other components within normal limits  TROPONIN I (HIGH SENSITIVITY) - Abnormal; Notable for the following components:   Troponin I (High Sensitivity) 25 (*)    All other components within normal limits  RESP PANEL BY RT-PCR (RSV, FLU A&B, COVID)  RVPGX2  TROPONIN I (HIGH SENSITIVITY)    EKG EKG Interpretation  Date/Time:  Tuesday March 31 2022 17:16:15 EST Ventricular Rate:  79 PR Interval:  116 QRS Duration: 70 QT Interval:  406 QTC Calculation: 465 R Axis:   59 Text Interpretation: Normal sinus rhythm Cannot rule out Anterior infarct , age undetermined Abnormal ECG When compared with ECG of 03-Feb-2022 13:25, No significant change since last tracing Confirmed by Gareth Morgan [phone]) on 03/31/2022 9:32:27  PM  Radiology DG Chest 1 View  Result Date: 03/31/2022 CLINICAL DATA:  Chest pain EXAM: CHEST  1 VIEW COMPARISON:  None Available. FINDINGS: Unchanged cardiomegaly. Both lungs are clear. No pleural effusion or pneumothorax. IMPRESSION: No active disease. Electronically Signed   By: Yetta Glassman M.D.   On: 03/31/2022 18:40    Procedures Procedures  {Document cardiac monitor, telemetry assessment procedure when appropriate:1}  Medications Ordered in ED Medications  metoCLOPramide (REGLAN) injection 10 mg (10 mg Intravenous Given 03/31/22 2314)  diphenhydrAMINE (BENADRYL) capsule 25 mg (25 mg Oral Given 03/31/22 2313)  acetaminophen (TYLENOL) tablet 1,000 mg (1,000 mg Oral Given 03/31/22 2313)  alum & mag hydroxide-simeth (MAALOX/MYLANTA) 200-200-20 MG/5ML suspension 30 mL (30 mLs Oral Given 03/31/22 2315)    ED Course/ Medical Decision Making/ A&P   {   Click here for ABCD2, HEART and other calculatorsREFRESH Note before signing :1}                          Medical Decision Making Risk OTC drugs. Prescription drug management.   This patient presents to the ED for concern of ***, this involves a number of treatment options, and is a complaint that carries with it a *** risk of complications and morbidity. A differential diagnosis was considered for the patient's symptoms which is discussed below:   ***   Co morbidities: Discussed in HPI   Brief History:  Patient is a 47 year old female past medical history significant for ESRD on dialysis Saturday last night, no longer makes urine, reflux, anemia requiring blood transfusions, depression, migraines, anxiety, hypertension, seizures, obesity, chronic respiratory failure with hypoxia on 3 L at baseline weaned down recently to room air.  Patient is presented emergency room today with complaints of 3+ months of not feeling well.  She feels that she has been generally more fatigued and unwell.  She states that she has had multiple  respiratory illnesses over the past  few months.  She indicates that she thinks she may have had the flu at 1 point and also states that she was diagnosed with pneumonia within this period of time as well.  Seems that she is over the past few days experience another respiratory illness she describes some cough congestion fatigue.  She also endorses a headache which she states is her primary pain today she describes it as a stabbing pain.  She endorses some nausea without emesis.  She endorses additionally some leg heaviness that is affecting both of her legs the extent that she feels that she is having trouble walking.  She does not walk with a walker or cane but rather ambulates without difficulty at baseline she states.  She also endorses some chest pain that began this morning she indicates that it is midsternal.  It seemed to come on this morning along with a headache.  Patient states that she feels that she has so much fluid on her.  She indicates that her lower extremity edema as well as some buttocks edema.  She has not taken any of her medications today.  She missed her dialysis session that was today as well.  She denies any fever, vomiting, abdominal pain.  No vertigo lightheadedness or dizziness no slurred speech or confusion.  No vision changes.  She states she fell this morning, further questioning it seems that she did not strike her head and it was more of a slump to the ground.    EMR reviewed including pt PMHx, past surgical history and past visits to ER.   See HPI for more details   Lab Tests:   {Blank single:19197::"I ordered and independently interpreted labs. Labs notable for","I personally reviewed all laboratory work and imaging. Metabolic panel without any acute abnormality specifically kidney function within normal limits and no significant electrolyte abnormalities. CBC without leukocytosis or significant anemia."}   Imaging Studies:  {Blank single:19197::"NAD. I  personally reviewed all imaging studies and no acute abnormality found. I agree with radiology interpretation.","Abnormal findings. I personally reviewed all imaging studies. Imaging notable for","No imaging studies ordered for this patient"}    Cardiac Monitoring:  .{Blank single:19197::"The patient was maintained on a cardiac monitor.  I personally viewed and interpreted the cardiac monitored which showed an underlying rhythm of:","NA"} .{Blank single:19197::"EKG non-ischemic","NA"}   Medicines ordered:  I ordered medication including ***  for *** Reevaluation of the patient after these medicines showed that the patient {resolved/improved/worsened:23923::"improved"} I have reviewed the patients home medicines and have made adjustments as needed   Critical Interventions:  .***   Consults/Attending Physician   .{Blank single:19197::"I requested consultation with ***,  and discussed lab and imaging findings as well as pertinent plan - they recommend: ***","I discussed this case with my attending physician who cosigned this note including patient's presenting symptoms, physical exam, and planned diagnostics and interventions. Attending physician stated agreement with plan or made changes to plan which were implemented."}   Reevaluation:  After the interventions noted above I re-evaluated patient and found that they have :{resolved/improved/worsened:23923::"improved"}   Social Determinants of Health:  Marland Kitchen{Blank single:19197::"Given cab voucher","Social work/case management involved","The patient's social determinants of health were a factor in the care of this patient"}    Problem List / ED Course:  ***   Dispostion:  After consideration of the diagnostic results and the patients response to treatment, I feel that the patent would benefit from ***        Final Clinical Impression(s) / ED Diagnoses Final diagnoses:  None    Rx / DC Orders ED Discharge Orders      None

## 2022-03-31 NOTE — ED Triage Notes (Signed)
Pt came in via POV d/t CP, HA & edema from her waist down to her feet. Also reports that her feet were hurting bad enough that she was not able to go to her dialysis this morning either. A/Ox4, rates her pain 10/10.

## 2022-04-01 ENCOUNTER — Institutional Professional Consult (permissible substitution): Payer: 59 | Admitting: Pulmonary Disease

## 2022-04-01 DIAGNOSIS — R519 Headache, unspecified: Secondary | ICD-10-CM | POA: Diagnosis not present

## 2022-04-01 LAB — RESP PANEL BY RT-PCR (RSV, FLU A&B, COVID)  RVPGX2
Influenza A by PCR: NEGATIVE
Influenza B by PCR: NEGATIVE
Resp Syncytial Virus by PCR: NEGATIVE
SARS Coronavirus 2 by RT PCR: NEGATIVE

## 2022-04-01 LAB — TROPONIN I (HIGH SENSITIVITY): Troponin I (High Sensitivity): 27 ng/L — ABNORMAL HIGH (ref <18)

## 2022-04-01 MED ORDER — LORAZEPAM 1 MG PO TABS
1.0000 mg | ORAL_TABLET | Freq: Once | ORAL | Status: AC
  Administered 2022-04-01: 1 mg via ORAL
  Filled 2022-04-01: qty 1

## 2022-04-01 MED ORDER — LORAZEPAM 1 MG PO TABS: 0.5000 mg | ORAL_TABLET | Freq: Once | ORAL | Status: DC

## 2022-04-01 NOTE — Discharge Instructions (Addendum)
You need to arrive at your dialysis at 12:15 today for your dialysis session

## 2022-04-01 NOTE — Progress Notes (Signed)
Contacted by Dr Moshe Cipro this morning to be advised that pt can receive out-pt HD at [facility] at 12:30 today. Contacted ED provider, pt's ED RN, and renal staff to make them aware of this information. Pt was d/c from ED. Contacted [facility] and spoke to [place]. Clinic advised that pt was d/c from ED and should arrive for 12:30 chair time. Navigator was not contacted by ED staff to confirm d/c so assuming this info was provided to pt prior to d/c.   Melven Sartorius Renal Navigator [phone]

## 2022-04-01 NOTE — ED Provider Notes (Signed)
Pt's care assumed from previous provider.  Pt awaiting Nephrology to plan dialysis.  Nephrologist advised pt can have dialysis today at 12:30.  I spoke to pt who will go to her facility.   Sidney Ace 04/01/22 1420    Carmin Muskrat, MD 04/01/22 [phone]

## 2022-04-01 NOTE — Progress Notes (Deleted)
Synopsis: Referred in January 2024 for shortness of breath and cough by Harvie Junior, MD  Subjective:   PATIENT ID: Desiree [name] GENDER: female DOB: June 22, 1975, MRN: [id number]  No chief complaint on file.   This is a 47 year old female, past medical history of anemia, anxiety, depression, end-stage renal disease on dialysis Tuesday Thursday Saturday, gastroesophageal reflux.  Patient presents for evaluation of shortness of breath and cough.    ***  Past Medical History:  Diagnosis Date   Anemia    Anxiety    panic attacks   Arthritis    bilateral knees   Childhood asthma    Complication of anesthesia    "sometimes it does not work; didn't during LEEP OR" (01/21/2016)   Depression    no med   ESRD (end stage renal disease) on dialysis ([place])    "TTS; [facility]; [facility]" (08/03/2016)   GERD (gastroesophageal reflux disease)    nexium prn   Gout    History of blood transfusion    "related to kidneys; I've had 4" (01/21/2016)   Hypertension    Migraine    last one 01/18/19   Preterm labor ~ 2014   Retina hole, left    Seizures ([place])    "last one was in 2000; related to preeclampsia" (01/21/2016)     Family History  Problem Relation Age of Onset   Diabetes Mother    Hyperlipidemia Mother    Hypertension Mother    Heart disease Mother    Hypertension Other    Diabetes type II Other      Past Surgical History:  Procedure Laterality Date   A/V FISTULAGRAM Left 12/19/2021   Procedure: A/V Fistulagram;  Surgeon: Angelia Mould, MD;  Location: [facility];  Service: Cardiovascular;  Laterality: Left;   AV FISTULA PLACEMENT Left 2010   AV FISTULA PLACEMENT Left 01/20/2019   Procedure: ARTERIOVENOUS (AV) FISTULA CREATION LEFT UPPER ARM;  Surgeon: Angelia Mould, MD;  Location: [place];  Service: Vascular;  Laterality: Left;   AV FISTULA PLACEMENT Left 12/19/2021   Procedure: ARTERIOVENOUS (AV) FISTULA CREATION POSSIBLE TUNNELED  DIALYSIS;  Surgeon: Waynetta Sandy, MD;  Location: [place];  Service: Vascular;  Laterality: Left;   CERVICAL BIOPSY  W/ LOOP ELECTRODE EXCISION  2001   DILATION AND EVACUATION  08/02/2011   Procedure: DILATATION AND EVACUATION;  Surgeon: Logan Bores, MD;  Location: [facility];  Service: Gynecology;;   DILATION AND EVACUATION N/A 08/31/2013   Procedure: DILATATION AND EVACUATION;  Surgeon: Woodroe Mode, MD;  Location: [facility];  Service: Gynecology;  Laterality: N/A;   FISTULA SUPERFICIALIZATION Left 01/20/2019   Procedure: Fistula Superficialization;  Surgeon: Angelia Mould, MD;  Location: [place] [facility];  Service: Vascular;  Laterality: Left;   HYDRADENITIS EXCISION Right    INSERTION OF DIALYSIS CATHETER Right 12/19/2021   Procedure: INSERTION OF A TUNNELED DIALYSIS CATHETER;  Surgeon: Waynetta Sandy, MD;  Location: [place];  Service: Vascular;  Laterality: Right;   RENAL BIOPSY     REVISION OF ARTERIOVENOUS GORETEX GRAFT Left 02/11/2013   Procedure: REVISION OF ARTERIOVENOUS GORTEX FISTULA;  Surgeon: Rosetta Posner, MD;  Location: [facility];  Service: Vascular;  Laterality: Left;   THROMBECTOMY W/ EMBOLECTOMY Left 05/25/2018   Procedure: REPAIR OF BLEEDING ARTERIOVENOUS FISTULA;  Surgeon: Serafina Mitchell, MD;  Location: [facility];  Service: Vascular;  Laterality: Left;    Social History   Socioeconomic History   Marital status: Married  Spouse name: Not on file   Number of children: Not on file   Years of education: Not on file   Highest education level: Not on file  Occupational History   Not on file  Tobacco Use   Smoking status: Never    Passive exposure: Yes   Smokeless tobacco: Never   Tobacco comments:    Husband Smokes  Vaping Use   Vaping Use: Never used  Substance and Sexual Activity   Alcohol use: No   Drug use: Yes    Types: Marijuana    Comment: not recent   Sexual activity: Yes    Birth control/protection: None  Other Topics Concern   Not on file   Social History Narrative   Not on file   Social Determinants of Health   Financial Resource Strain: Not on file  Food Insecurity: Not on file  Transportation Needs: Not on file  Physical Activity: Not on file  Stress: Not on file  Social Connections: Not on file  Intimate Partner Violence: Not on file     Allergies  Allergen Reactions   Morphine Shortness Of Breath and Anaphylaxis   Prednisone Other (See Comments)    Other reaction(s): Other (See Comments) Muscle spasms Patient says prednisone causes her to cramp all over, muscle spasms uncontrolled   Tuna [Fish Allergy] Itching, Swelling, Rash and Other (See Comments)    Face droops also   Amlodipine Other (See Comments)    Angioedema (09/05/17 ED visit)   Iodinated Contrast Media Itching   Tape Itching    Adhesive tape   paper tape ok     Outpatient Medications Prior to Visit  Medication Sig Dispense Refill   albuterol (PROVENTIL HFA;VENTOLIN HFA) 108 (90 Base) MCG/ACT inhaler Inhale 2 puffs into the lungs every 4 (four) hours as needed for wheezing or shortness of breath. 1 Inhaler 2   ALPRAZolam (XANAX) 1 MG tablet Take 1 mg by mouth 3 (three) times daily as needed.     amoxicillin-clavulanate (AUGMENTIN) 875-125 MG tablet Take 1 tablet by mouth 2 (two) times daily. 6 tablet 0   aspirin EC 81 MG tablet Take 81 mg by mouth daily. Swallow whole.     calcium acetate (PHOSLO) 667 MG capsule Take 667-1,334 mg by mouth See admin instructions. 3 capsules with meals  1 to 2 capsules with snacks  4   cinacalcet (SENSIPAR) 60 MG tablet Take 60 mg by mouth every evening.     cyclobenzaprine (FLEXERIL) 10 MG tablet Take 10 mg by mouth 2 (two) times daily as needed for muscle spasms.     diphenhydrAMINE (BENADRYL) 25 MG tablet Take 1 tablet (25 mg total) by mouth every 6 (six) hours. (Patient taking differently: Take 25 mg by mouth every 6 (six) hours as needed for itching or allergies.) 20 tablet 0   lactulose (CHRONULAC) 10  GM/15ML solution Take 30 mLs by mouth daily as needed for mild constipation, moderate constipation or severe constipation.     NARCAN 4 MG/0.1ML LIQD nasal spray kit Place 1 spray into the nose as needed (accidental overdose).     nystatin-triamcinolone (MYCOLOG II) cream Apply topically 2 (two) times daily.     ondansetron (ZOFRAN-ODT) 8 MG disintegrating tablet Take 8 mg by mouth every 8 (eight) hours as needed for nausea or vomiting.      Oxycodone HCl 10 MG TABS Take 1 tablet (10 mg total) by mouth 3 (three) times daily as needed (severe pain). 5 tablet 0  pantoprazole (PROTONIX) 20 MG tablet Take 20 mg by mouth daily.     sevelamer carbonate (RENVELA) 800 MG tablet Take 1,600 mg by mouth 3 (three) times daily.     No facility-administered medications prior to visit.    ROS   Objective:  Physical Exam   There were no vitals filed for this visit.   on *** LPM *** RA BMI Readings from Last 3 Encounters:  02/11/22 31.78 kg/m  02/03/22 31.92 kg/m  12/31/21 32.05 kg/m   Wt Readings from Last 3 Encounters:  02/11/22 191 lb (86.6 kg)  02/03/22 191 lb 12.8 oz (87 kg)  12/31/21 192 lb 9.6 oz (87.4 kg)     CBC    Component Value Date/Time   WBC 4.5 03/31/2022 1750   RBC 4.04 03/31/2022 1750   HGB 10.1 (L) 03/31/2022 1750   HCT 32.2 (L) 03/31/2022 1750   PLT 201 03/31/2022 1750   MCV 79.7 (L) 03/31/2022 1750   MCH 25.0 (L) 03/31/2022 1750   MCHC 31.4 03/31/2022 1750   RDW 19.5 (H) 03/31/2022 1750   LYMPHSABS 1.4 02/04/2022 0213   MONOABS 0.7 02/04/2022 0213   EOSABS 0.3 02/04/2022 0213   BASOSABS 0.0 02/04/2022 0213    ***  Chest Imaging: ***  Pulmonary Functions Testing Results:     No data to display          FeNO: ***  Pathology: ***  Echocardiogram: ***  Heart Catheterization: ***    Assessment & Plan:   No diagnosis found.  Discussion: ***   Current Outpatient Medications:    albuterol (PROVENTIL HFA;VENTOLIN HFA) 108 (90 Base) MCG/ACT  inhaler, Inhale 2 puffs into the lungs every 4 (four) hours as needed for wheezing or shortness of breath., Disp: 1 Inhaler, Rfl: 2   ALPRAZolam (XANAX) 1 MG tablet, Take 1 mg by mouth 3 (three) times daily as needed., Disp: , Rfl:    amoxicillin-clavulanate (AUGMENTIN) 875-125 MG tablet, Take 1 tablet by mouth 2 (two) times daily., Disp: 6 tablet, Rfl: 0   aspirin EC 81 MG tablet, Take 81 mg by mouth daily. Swallow whole., Disp: , Rfl:    calcium acetate (PHOSLO) 667 MG capsule, Take 667-1,334 mg by mouth See admin instructions. 3 capsules with meals  1 to 2 capsules with snacks, Disp: , Rfl: 4   cinacalcet (SENSIPAR) 60 MG tablet, Take 60 mg by mouth every evening., Disp: , Rfl:    cyclobenzaprine (FLEXERIL) 10 MG tablet, Take 10 mg by mouth 2 (two) times daily as needed for muscle spasms., Disp: , Rfl:    diphenhydrAMINE (BENADRYL) 25 MG tablet, Take 1 tablet (25 mg total) by mouth every 6 (six) hours. (Patient taking differently: Take 25 mg by mouth every 6 (six) hours as needed for itching or allergies.), Disp: 20 tablet, Rfl: 0   lactulose (CHRONULAC) 10 GM/15ML solution, Take 30 mLs by mouth daily as needed for mild constipation, moderate constipation or severe constipation., Disp: , Rfl:    NARCAN 4 MG/0.1ML LIQD nasal spray kit, Place 1 spray into the nose as needed (accidental overdose)., Disp: , Rfl:    nystatin-triamcinolone (MYCOLOG II) cream, Apply topically 2 (two) times daily., Disp: , Rfl:    ondansetron (ZOFRAN-ODT) 8 MG disintegrating tablet, Take 8 mg by mouth every 8 (eight) hours as needed for nausea or vomiting. , Disp: , Rfl:    Oxycodone HCl 10 MG TABS, Take 1 tablet (10 mg total) by mouth 3 (three) times daily as needed (severe  pain)., Disp: 5 tablet, Rfl: 0   pantoprazole (PROTONIX) 20 MG tablet, Take 20 mg by mouth daily., Disp: , Rfl:    sevelamer carbonate (RENVELA) 800 MG tablet, Take 1,600 mg by mouth 3 (three) times daily., Disp: , Rfl:   I spent *** minutes dedicated  to the care of this patient on the date of this encounter to include pre-visit review of records, face-to-face time with the patient discussing conditions above, post visit ordering of testing, clinical documentation with the electronic health record, making appropriate referrals as documented, and communicating necessary findings to members of the patients care team.   Garner Nash, [facility] 04/01/2022 11:30 AM

## 2022-04-16 ENCOUNTER — Institutional Professional Consult (permissible substitution): Payer: 59 | Admitting: Pulmonary Disease

## 2022-04-30 ENCOUNTER — Institutional Professional Consult (permissible substitution) (HOSPITAL_BASED_OUTPATIENT_CLINIC_OR_DEPARTMENT_OTHER): Payer: 59 | Admitting: Pulmonary Disease

## 2022-04-30 ENCOUNTER — Ambulatory Visit (INDEPENDENT_AMBULATORY_CARE_PROVIDER_SITE_OTHER): Payer: 59

## 2022-04-30 ENCOUNTER — Telehealth (HOSPITAL_BASED_OUTPATIENT_CLINIC_OR_DEPARTMENT_OTHER): Payer: Self-pay | Admitting: Pulmonary Disease

## 2022-04-30 ENCOUNTER — Other Ambulatory Visit (HOSPITAL_BASED_OUTPATIENT_CLINIC_OR_DEPARTMENT_OTHER): Payer: Self-pay | Admitting: Pulmonary Disease

## 2022-04-30 ENCOUNTER — Telehealth (HOSPITAL_BASED_OUTPATIENT_CLINIC_OR_DEPARTMENT_OTHER): Payer: Self-pay | Admitting: Cardiology

## 2022-04-30 ENCOUNTER — Encounter (HOSPITAL_BASED_OUTPATIENT_CLINIC_OR_DEPARTMENT_OTHER): Payer: Self-pay | Admitting: Pulmonary Disease

## 2022-04-30 ENCOUNTER — Ambulatory Visit (INDEPENDENT_AMBULATORY_CARE_PROVIDER_SITE_OTHER): Payer: 59 | Admitting: Pulmonary Disease

## 2022-04-30 VITALS — BP 122/60 | HR 82 | Ht 65.0 in | Wt 185.0 lb

## 2022-04-30 DIAGNOSIS — J9611 Chronic respiratory failure with hypoxia: Secondary | ICD-10-CM

## 2022-04-30 DIAGNOSIS — J45998 Other asthma: Secondary | ICD-10-CM

## 2022-04-30 DIAGNOSIS — J13 Pneumonia due to Streptococcus pneumoniae: Secondary | ICD-10-CM | POA: Diagnosis not present

## 2022-04-30 DIAGNOSIS — J129 Viral pneumonia, unspecified: Secondary | ICD-10-CM

## 2022-04-30 DIAGNOSIS — J45901 Unspecified asthma with (acute) exacerbation: Secondary | ICD-10-CM | POA: Insufficient documentation

## 2022-04-30 MED ORDER — AMOXICILLIN-POT CLAVULANATE 875-125 MG PO TABS: 1.0000 | ORAL_TABLET | Freq: Two times a day (BID) | ORAL | 0 refills | Status: DC

## 2022-04-30 MED ORDER — FLUTICASONE FUROATE-VILANTEROL 100-25 MCG/ACT IN AEPB: 1.0000 | INHALATION_SPRAY | Freq: Every day | RESPIRATORY_TRACT | 2 refills | Status: AC

## 2022-04-30 NOTE — Progress Notes (Signed)
Subjective:    Patient ID: Desiree [name], female    DOB: 02-09-1976, 47 y.o.   MRN: [id number]  HPI  47 year old dialysis patient, presents to establish care for chronic respiratory failure hypoxia  PMH - ESRD on HD T/TH/Sat since 2010, nephrotic syndrome  anxiety and depression,  GERD, hypertension  chronic respiratory failure 3 L oxygen -Cocaine and THC abuse   She was started on oxygen after cardiology office visit in September 2023 when she was found to desaturate. Evaluation during this visit included a CT angiogram that was negative for pulm embolism suggested pulmonary edema and echo showed normal LV function with calcification of the valves without stenosis  She was hospitalized 02/03/2022 for increasing dyspnea and oxygen needs and treated for community-acquired pneumonia and improved after dialysis. CT abdomen/pelvis was negative for abdominal pain  She reports persistent cough since then, to the point where her chest hurts. She had a chest cold following URI symptoms and was given promethazine by PCP.  She requested codeine but was not provided.  Cough is wet sounding but nonproductive  She reports asthma as a child and has used albuterol MDI "too much" triggers are smoking cold air She reports nodules that have come up on her thigh and her fingers and she has pointed this out to her dialysis team. She reports severe insomnia due to anxiety and unable to sleep during the night, she generally sleeps in the sun comes up and after a few nights like this will crash and sleep all day including during dialysis.  Allergies-include Tylenol and prednisone  Oxygen saturation today on room air was 97%  Significant tests/ events reviewed  12/04/21  cardiology OV >>ambulatory O2 evaluation: Sitting, on room air: O2 94% Room air, walking at slow pace: Range 77-82% On 3L O2 by nasal cannula, sitting: 97% On 3L O2 by nasal cannula, walking: 94%  01/2022 CT chest -airspace disease  in the lingula and both lower lobes with pulmonary edema pattern and small effusions   Past Medical History:  Diagnosis Date   Anemia    Anxiety    panic attacks   Arthritis    bilateral knees   Childhood asthma    Complication of anesthesia    "sometimes it does not work; didn't during LEEP OR" (01/21/2016)   Depression    no med   ESRD (end stage renal disease) on dialysis ([place])    "TTS; [facility]" (08/03/2016)   GERD (gastroesophageal reflux disease)    nexium prn   Gout    History of blood transfusion    "related to kidneys; I've had 4" (01/21/2016)   Hypertension    Migraine    last one 01/18/19   Preterm labor ~ 2014   Retina hole, left    Seizures ([place])    "last one was in 2000; related to preeclampsia" (01/21/2016)    Past Surgical History:  Procedure Laterality Date   A/V FISTULAGRAM Left 12/19/2021   Procedure: A/V Fistulagram;  Surgeon: Angelia Mould, MD;  Location: [place] [facility];  Service: Cardiovascular;  Laterality: Left;   AV FISTULA PLACEMENT Left 2010   AV FISTULA PLACEMENT Left 01/20/2019   Procedure: ARTERIOVENOUS (AV) FISTULA CREATION LEFT UPPER ARM;  Surgeon: Angelia Mould, MD;  Location: [place];  Service: Vascular;  Laterality: Left;   AV FISTULA PLACEMENT Left 12/19/2021   Procedure: ARTERIOVENOUS (AV) FISTULA CREATION POSSIBLE TUNNELED DIALYSIS;  Surgeon: Waynetta Sandy, MD;  Location: [facility];  Service: Vascular;  Laterality: Left;   CERVICAL BIOPSY  W/ LOOP ELECTRODE EXCISION  2001   DILATION AND EVACUATION  08/02/2011   Procedure: DILATATION AND EVACUATION;  Surgeon: Logan Bores, MD;  Location: [facility];  Service: Gynecology;;   DILATION AND EVACUATION N/A 08/31/2013   Procedure: DILATATION AND EVACUATION;  Surgeon: Woodroe Mode, MD;  Location: [facility];  Service: Gynecology;  Laterality: N/A;   FISTULA SUPERFICIALIZATION Left 01/20/2019   Procedure: Fistula Superficialization;  Surgeon: Angelia Mould, MD;  Location: [facility];  Service: Vascular;  Laterality: Left;   HYDRADENITIS EXCISION Right    INSERTION OF DIALYSIS CATHETER Right 12/19/2021   Procedure: INSERTION OF A TUNNELED DIALYSIS CATHETER;  Surgeon: Waynetta Sandy, MD;  Location: [place];  Service: Vascular;  Laterality: Right;   RENAL BIOPSY     REVISION OF ARTERIOVENOUS GORETEX GRAFT Left 02/11/2013   Procedure: REVISION OF ARTERIOVENOUS GORTEX FISTULA;  Surgeon: Rosetta Posner, MD;  Location: [facility];  Service: Vascular;  Laterality: Left;   THROMBECTOMY W/ EMBOLECTOMY Left 05/25/2018   Procedure: REPAIR OF BLEEDING ARTERIOVENOUS FISTULA;  Surgeon: Serafina Mitchell, MD;  Location: [place];  Service: Vascular;  Laterality: Left;    Allergies  Allergen Reactions   Morphine Shortness Of Breath and Anaphylaxis   Prednisone Other (See Comments)    Other reaction(s): Other (See Comments) Muscle spasms Patient says prednisone causes her to cramp all over, muscle spasms uncontrolled   Tuna [Fish Allergy] Itching, Swelling, Rash and Other (See Comments)    Face droops also   Amlodipine Other (See Comments)    Angioedema (09/05/17 ED visit)   Iodinated Contrast Media Itching   Tape Itching    Adhesive tape   paper tape ok    Social History   Socioeconomic History   Marital status: Married    Spouse name: Not on file   Number of children: Not on file   Years of education: Not on file   Highest education level: Not on file  Occupational History   Not on file  Tobacco Use   Smoking status: Never    Passive exposure: Yes   Smokeless tobacco: Never   Tobacco comments:    Husband and daughter Smokes on patio  Vaping Use   Vaping Use: Never used  Substance and Sexual Activity   Alcohol use: No   Drug use: Yes    Types: Marijuana    Comment: not recent   Sexual activity: Yes    Birth control/protection: None  Other Topics Concern   Not on file  Social History Narrative   Not on file   Social  Determinants of Health   Financial Resource Strain: Not on file  Food Insecurity: Not on file  Transportation Needs: Not on file  Physical Activity: Not on file  Stress: Not on file  Social Connections: Not on file  Intimate Partner Violence: Not on file    Family History  Problem Relation Age of Onset   Diabetes Mother    Hyperlipidemia Mother    Hypertension Mother    Heart disease Mother    Hypertension Other    Diabetes type II Other       Review of Systems Nose runs all the time Ears itch Reports nodules on thigh and fingers Substernal chest pain from constant coughing Lower extremity edema up to thighs.   Constitutional: negative for anorexia, fevers and sweats  Eyes: negative for irritation, redness and visual disturbance  Ears,  nose, mouth, throat, and face: negative for earaches, epistaxis and sore throat  Cardiovascular: negative for  orthopnea, palpitations and syncope  Gastrointestinal: negative for abdominal pain, constipation, diarrhea, melena, nausea and vomiting  Genitourinary:negative for dysuria, frequency and hematuria  Hematologic/lymphatic: negative for bleeding, easy bruising and lymphadenopathy  Musculoskeletal:negative for arthralgias, muscle weakness and stiff joints  Neurological: negative for coordination problems, gait problems, headaches and weakness  Endocrine: negative for diabetic symptoms including polydipsia, polyuria and weight loss     Objective:   Physical Exam  Gen. Pleasant, obese, in no distress, anxious affect ENT - no pallor,icterus, no post nasal drip, class 2airway Neck: No JVD, no thyromegaly, no carotid bruits, neck scars from permacath Lungs: no use of accessory muscles, no dullness to percussion, decreased without rales or rhonchi  Cardiovascular: Rhythm regular, heart sounds  normal,esm 2/6 at base, 1+ peripheral edema Abdomen: soft and non-tender, no hepatosplenomegaly, BS normal. Musculoskeletal: No deformities, no  cyanosis or clubbing Neuro:  alert, non focal, no tremors       Assessment & Plan:

## 2022-04-30 NOTE — Assessment & Plan Note (Signed)
She reports child history of asthma with frequent use of albuterol.  We will provide her with controller medication such as Breo explained the difference between maintenance and rescue medication. Hopefully this will cut down the use of albuterol. Will also obtain PFTs once acute bronchitis episode is resolved

## 2022-04-30 NOTE — Assessment & Plan Note (Signed)
Unclear etiology, previously attributed to pulm edema. Echo did not show significant pulm hypertension. Today oxygen saturation was 97 percent on room air.  In the future we will assess ambulatory saturation if she is in a stable state.  Meanwhile she will continue on oxygen, she was certified in September by cardiology

## 2022-04-30 NOTE — Assessment & Plan Note (Addendum)
Appears to have resolved.  We repeated chest x-ray today which does not show any infiltrates or effusions She appears to have a new bronchitis and I will treat her with Augmentin for 7 days. She is allergic to prednisone so we will hold off Due to her significant coughing that has caused chest pain she requests a codeine cough syrup which has worked for her in the past.  I explained to her that she is already on oxycodone . I reviewed PMDP usage of oxycodone and this was recently filled 2/4

## 2022-04-30 NOTE — Telephone Encounter (Signed)
Patient was a walk in today and states Dr. Harrell Gave ordered her oxygen and [employer] [facility] needs an SRO faxed to them.

## 2022-04-30 NOTE — Patient Instructions (Addendum)
X augmentin 875 bid x 7 days  X CXR today - follow up pneumonia  X O2 check on RA  X schedule pFTs in 19month Cough syrup will be sent to pharmacy

## 2022-04-30 NOTE — Telephone Encounter (Signed)
Also printed and placed on your desk

## 2022-05-01 NOTE — Telephone Encounter (Signed)
Duplicate encounter, another one has been sent to provider for review.

## 2022-05-01 NOTE — Telephone Encounter (Signed)
Patient checking on message for cough syrup. Patient phone number is [phone].

## 2022-05-01 NOTE — Telephone Encounter (Signed)
Please advise about cough syrup

## 2022-05-04 NOTE — Telephone Encounter (Signed)
Spk to Tishana let her know provider recommendation. Nothing further

## 2022-05-11 NOTE — Telephone Encounter (Signed)
May 08, 2022 Buford Dresser, MD  to Gerald Stabs, RN     05/08/22  8:03 AM She is seeing pulmonology (Dr. Elsworth Soho). We did this to help in the immediate short term, but this is best handled by her pulmonology team in the long term.  Left message to call back

## 2022-05-14 NOTE — Telephone Encounter (Signed)
2nd contact attempt, no answer, patient seen by pulmonology. Removing from triage.

## 2022-06-01 NOTE — Therapy (Incomplete)
OUTPATIENT OCCUPATIONAL THERAPY NEURO EVALUATION  Patient Name: Desiree [name] MRN: [id number] DOB:[birth year], 47 y.o., female Today's Date: 06/02/2022  PCP: Dr. Jimmye Norman REFERRING PROVIDER: Dr. Moshe Cipro  END OF SESSION:  OT End of Session - 06/02/22 1331     Visit Number 1    Number of Visits 25    Date for OT Re-Evaluation 08/25/22    Authorization Type [employer] Medicare    OT Start Time 79    OT Stop Time 1305    OT Time Calculation (min) 35 min    Activity Tolerance Patient limited by fatigue;Patient limited by pain    Behavior During Therapy WFL for tasks assessed/performed             Past Medical History:  Diagnosis Date   Anemia    Anxiety    panic attacks   Arthritis    bilateral knees   Childhood asthma    Complication of anesthesia    "sometimes it does not work; didn't during LEEP OR" (01/21/2016)   Depression    no med   ESRD (end stage renal disease) on dialysis ([place])    "TTS; [facility]" (08/03/2016)   GERD (gastroesophageal reflux disease)    nexium prn   Gout    History of blood transfusion    "related to kidneys; I've had 4" (01/21/2016)   Hypertension    Migraine    last one 01/18/19   Preterm labor ~ 2014   Retina hole, left    Seizures ([place])    "last one was in 2000; related to preeclampsia" (01/21/2016)   Past Surgical History:  Procedure Laterality Date   A/V FISTULAGRAM Left 12/19/2021   Procedure: A/V Fistulagram;  Surgeon: Angelia Mould, MD;  Location: [place] [facility];  Service: Cardiovascular;  Laterality: Left;   AV FISTULA PLACEMENT Left 2010   AV FISTULA PLACEMENT Left 01/20/2019   Procedure: ARTERIOVENOUS (AV) FISTULA CREATION LEFT UPPER ARM;  Surgeon: Angelia Mould, MD;  Location: [place];  Service: Vascular;  Laterality: Left;   AV FISTULA PLACEMENT Left 12/19/2021   Procedure: ARTERIOVENOUS (AV) FISTULA CREATION POSSIBLE TUNNELED DIALYSIS;  Surgeon: Waynetta Sandy, MD;  Location:  [place];  Service: Vascular;  Laterality: Left;   CERVICAL BIOPSY  W/ LOOP ELECTRODE EXCISION  2001   DILATION AND EVACUATION  08/02/2011   Procedure: DILATATION AND EVACUATION;  Surgeon: Logan Bores, MD;  Location: [place] [facility];  Service: Gynecology;;   DILATION AND EVACUATION N/A 08/31/2013   Procedure: DILATATION AND EVACUATION;  Surgeon: Woodroe Mode, MD;  Location: [place] [facility];  Service: Gynecology;  Laterality: N/A;   FISTULA SUPERFICIALIZATION Left 01/20/2019   Procedure: Fistula Superficialization;  Surgeon: Angelia Mould, MD;  Location: [place];  Service: Vascular;  Laterality: Left;   HYDRADENITIS EXCISION Right    INSERTION OF DIALYSIS CATHETER Right 12/19/2021   Procedure: INSERTION OF A TUNNELED DIALYSIS CATHETER;  Surgeon: Waynetta Sandy, MD;  Location: [place];  Service: Vascular;  Laterality: Right;   RENAL BIOPSY     REVISION OF ARTERIOVENOUS GORETEX GRAFT Left 02/11/2013   Procedure: REVISION OF ARTERIOVENOUS GORTEX FISTULA;  Surgeon: Rosetta Posner, MD;  Location: [facility];  Service: Vascular;  Laterality: Left;   THROMBECTOMY W/ EMBOLECTOMY Left 05/25/2018   Procedure: REPAIR OF BLEEDING ARTERIOVENOUS FISTULA;  Surgeon: Serafina Mitchell, MD;  Location: [facility] OR;  Service: Vascular;  Laterality: Left;   Patient Active Problem List   Diagnosis Date Noted  Asthma, persistent not controlled 04/30/2022   CAP (community acquired pneumonia) due to Pneumococcus ([place]) 02/03/2022   Chronic respiratory failure with hypoxia ([place]) 02/03/2022   Anxiety and depression 02/03/2022   Gout 02/03/2022   HTN (hypertension) 02/03/2022   Community acquired pneumonia 02/03/2022   Volume overload 12/21/2021   Skin infection 12/20/2021   Class 1 obesity 12/20/2021   CHF (congestive heart failure) ([place]) 12/19/2021   Hypertensive urgency 12/05/2021   Prolonged QT interval 12/05/2021   Anxiety    Acute gastroenteritis 02/16/2021   GERD (gastroesophageal reflux disease) 01/11/2021    Hyponatremia 01/11/2021   Pulmonary edema 01/11/2021   ESRD (end stage renal disease) on dialysis ([place]) 06/02/2018   Fever 12/07/2017   Abdominal pain 08/03/2016   Chronic pain 01/21/2016   Muscle cramps 03/29/2015   Anemia of chronic kidney failure 09/15/2013   Hyperkalemia 08/29/2013   Cocaine abuse ([place]) 08/26/2013   Depression 04/21/2012   ESRD (end stage renal disease) ([place]) 08/03/2011    ONSET DATE: 05/21/22  REFERRING DIAG: weakness, FTT in dialysis pctn   THERAPY DIAG:  Muscle weakness (generalized) - Plan: Ot plan of care cert/re-cert  Chronic left shoulder pain - Plan: Ot plan of care cert/re-cert  Chronic right shoulder pain - Plan: Ot plan of care cert/re-cert  Other abnormalities of gait and mobility - Plan: Ot plan of care cert/re-cert  Unsteadiness on feet - Plan: Ot plan of care cert/re-cert  Rationale for Evaluation and Treatment: Rehabilitation  SUBJECTIVE:   SUBJECTIVE STATEMENT: Pt reports generalized pain Pt accompanied by: self  PERTINENT HISTORY: PMH - ESRD on HD T/TH/Sat since 2010, nephrotic syndrome, anxiety and depression, GERD, hypertension  chronic respiratory failure 3 L oxygen,Cocaine and THC abuse Pt. was hospitalized 02/03/2022 for increasing dyspnea and oxygen needs and treated for community-acquired pneumonia and improved after dialysis.    PRECAUTIONS: Other: dialysis T/TH/Sat , 3 L O2, fall risk  WEIGHT BEARING RESTRICTIONS: no  PAIN:  Are you having pain? Yes: NPRS scale: 10/10 Pain location: shoulders and LE's Pain description: aching Aggravating factors: movement Relieving factors: rest, sleep  FALLS: Has patient fallen in last 6 months? No  LIVING ENVIRONMENT: Lives with: lives with their family and lives with their spouse Lives in: House/apartment Stairs: No Has following equipment at home: None  PLOF: Independent prior to October  PATIENT GOALS: improve use of arms and decrease pain  OBJECTIVE:   HAND  DOMINANCE: Right  ADLs: Overall ADLs: increased time required Transfers/ambulation related to ADLs: Eating: pt is able to feed herself but she is not hungry Grooming: mod I UB Dressing: mod I shirt , needs help with jacket(mod A) LB Dressing: max-dependent with LB dressing Toileting: performs mod I with difficulty Bathing: supervision Tub Shower transfers:min A bathtub shower Equipment: none Therapist called and spoke with SW at [facility] and requested order for 3 in 1 commode for patient. SW will request from MD and Patient to discuss with her on Thursday.  IADLs: Shopping: dependent Light housekeeping: dependent Meal Prep: dependent Community mobility: supervision Medication management: Pt handles   MOBILITY STATUS: independent with increased time required    ACTIVITY TOLERANCE: Activity tolerance: Pt reports that she tolerateds 5 mins or less standing for functional activity due to pain.  FUNCTIONAL OUTCOME MEASURES: Quick Dash: 85% disability  UPPER EXTREMITY ROM:  Pt demonstrates decreased A/ROM R thumb flexion, she reports catching in car door  Active ROM Right eval Left eval  Shoulder flexion 80 70  Shoulder  abduction 80 65  Shoulder adduction    Shoulder extension    Shoulder internal rotation    Shoulder external rotation    Elbow flexion WFL 110  Elbow extension -65 -40  Wrist flexion 50% WFL  Wrist extension 50% WFL  Wrist ulnar deviation    Wrist radial deviation    Wrist pronation    Wrist supination    (Blank rows = not tested)  HAND FUNCTION: Grip strength: Right: 8 lbs; Left: 14 lbs  COORDINATION:NT   SENSATION: Not tested  EDEMA: Pt reports edema in bilateral legs    COGNITION: Overall cognitive status: Within functional limits for tasks assessed   VISION ASSESSMENT: Not tested- 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   TODAY'S  TREATMENT:                                                                                                                              DATE: 06/02/22- Pt fatigued very quickly and is s/p dialysis today. Pt ambulated to a private room and pt was placed in a reclined position until ready for PT eval to rest.   PATIENT EDUCATION: Education details: role of OT, and potential OT goals, recommendation that pt does not perform tutb transfers without assistance, and recommendation for 3 in 1- pt to discuss with SW at dialysis on Thurs Person educated: Patient Education method: Explanation Education comprehension: verbalized understanding  HOME EXERCISE PROGRAM: N/a   GOALS: Potential Goals reviewed with patient? Yes  SHORT TERM GOALS: Target date: 07/02/22  I with initial HEP Baseline:dependent Goal status: INITIAL  2.  I with AE strategies for ADLS and DME recommendations (ie: 3 in -1 and tub bench) Baseline: dependent Goal status: INITIAL  3.  Pt will increase bilateral shoulder flexion by 5* for increased functional reach Baseline: RUE 80*, LUE 70* Goal status: INITIAL  4.  Pt will perform LB dressing with mod A using AE prn. Baseline: max-dependent Goal status: INITIAL  5.  Pt will perform functional activity in standing x 8 mins prior to rest break. Baseline: stands 5 mins or less prior to rest break. Goal status: INITIAL  6.  Pt will report bilateral UE pain is no greater than 6/10 for ADLS. Baseline: 10/10 pain Goal status: INITIAL  LONG TERM GOALS: Target date: 08/25/22  I with updated HEP Baseline: dependent Goal status: INITIAL  2.  Pt will donn jacket mod I Baseline: mod A Goal status: INITIAL  3.  Pt. Will perform LB dressing min A with AE prn. Baseline: max- dependent Goal status: INITIAL  4.  Pt will increase bilateral grip strength by 8 lbs for increased functional use. Baseline: RUE 8 lbs, LUE 14 lbs. Goal status: INITIAL  5.  Pt will perform  functional activity in standing x 12 mins prior to rest break. Baseline: stands for 5 mins or less prior  to rest break Goal status: INITIAL  6.  Pt will perform light home management tasks modified independently Baseline: dependent Goal status: INITIAL  ASSESSMENT:  CLINICAL IMPRESSION: Patient is a 47 y.o. female who was seen today for occupational therapy evaluation for weakness, FTT, on dialysis. PMH - ESRD on HD T/TH/Sat since 2010, nephrotic syndrome, anxiety and depression, GERD, hypertension ,chronic respiratory failure on 3 L oxygen, hx of Cocaine and THC abuse. Pt. was hospitalized 02/03/2022 for increasing dyspnea and oxygen needs and treated for community-acquired pneumonia and improved after dialysis. Pt can benefit from skilled occupational therapy to maximize pt's safety and I with ADLs/ IADLs.   PERFORMANCE DEFICITS: in functional skills including ,  and psychosocial skills including coping strategies, environmental adaptation, habits, interpersonal interactions, and routines and behaviors.   IMPAIRMENTS: are limiting patient from ADLs, IADLs, rest and sleep, play, leisure, and social participation.   CO-MORBIDITIES: may have co-morbidities  that affects occupational performance. Patient will benefit from skilled OT to address above impairments and improve overall function.  MODIFICATION OR ASSISTANCE TO COMPLETE EVALUATION: Min-Moderate modification of tasks or assist with assess necessary to complete an evaluation.  OT OCCUPATIONAL PROFILE AND HISTORY: Detailed assessment: Review of records and additional review of physical, cognitive, psychosocial history related to current functional performance.  CLINICAL DECISION MAKING: Moderate - several treatment options, min-mod task modification necessary  REHAB POTENTIAL: Good  EVALUATION COMPLEXITY: Moderate    PLAN:  OT FREQUENCY: 2x/week  OT DURATION: 12 weeksplus eval  PLANNED INTERVENTIONS: self care/ADL  training, therapeutic exercise, therapeutic activity, neuromuscular re-education, manual therapy, passive range of motion, balance training, functional mobility training, ultrasound, paraffin, moist heat, cryotherapy, contrast bath, patient/family education, energy conservation, coping strategies training, DME and/or AE instructions, and Re-evaluation  RECOMMENDED OTHER SERVICES: PT  CONSULTED AND AGREED WITH PLAN OF CARE: Patient  PLAN FOR NEXT SESSION: initial HEP, closed chain shoulder flex, adapted strategies for ADLS   Suhaib Guzzo, OT 06/02/2022, 3:06 PM

## 2022-06-01 NOTE — Therapy (Unsigned)
OUTPATIENT PHYSICAL THERAPY LOWER EXTREMITY EVALUATION   Patient Name: Desiree [name] MRN: [id number] DOB:1975/08/27, 47 y.o., female Today's Date: 06/02/2022  END OF SESSION:  PT End of Session - 06/02/22 1411     Visit Number 1    Date for PT Re-Evaluation 08/25/22    PT Start Time 1315    PT Stop Time 1358    PT Time Calculation (min) 43 min    Activity Tolerance Patient limited by fatigue;Patient limited by pain    Behavior During Therapy [facility] for tasks assessed/performed             Past Medical History:  Diagnosis Date   Anemia    Anxiety    panic attacks   Arthritis    bilateral knees   Childhood asthma    Complication of anesthesia    "sometimes it does not work; didn't during LEEP OR" (01/21/2016)   Depression    no med   ESRD (end stage renal disease) on dialysis ([place])    "TTS; [facility]" (08/03/2016)   GERD (gastroesophageal reflux disease)    nexium prn   Gout    History of blood transfusion    "related to kidneys; I've had 4" (01/21/2016)   Hypertension    Migraine    last one 01/18/19   Preterm labor ~ 2014   Retina hole, left    Seizures ([place])    "last one was in 2000; related to preeclampsia" (01/21/2016)   Past Surgical History:  Procedure Laterality Date   A/V FISTULAGRAM Left 12/19/2021   Procedure: A/V Fistulagram;  Surgeon: Angelia Mould, MD;  Location: [place] [facility];  Service: Cardiovascular;  Laterality: Left;   AV FISTULA PLACEMENT Left 2010   AV FISTULA PLACEMENT Left 01/20/2019   Procedure: ARTERIOVENOUS (AV) FISTULA CREATION LEFT UPPER ARM;  Surgeon: Angelia Mould, MD;  Location: [place];  Service: Vascular;  Laterality: Left;   AV FISTULA PLACEMENT Left 12/19/2021   Procedure: ARTERIOVENOUS (AV) FISTULA CREATION POSSIBLE TUNNELED DIALYSIS;  Surgeon: Waynetta Sandy, MD;  Location: [place];  Service: Vascular;  Laterality: Left;   CERVICAL BIOPSY  W/ LOOP ELECTRODE EXCISION  2001   DILATION  AND EVACUATION  08/02/2011   Procedure: DILATATION AND EVACUATION;  Surgeon: Logan Bores, MD;  Location: [place] [facility];  Service: Gynecology;;   DILATION AND EVACUATION N/A 08/31/2013   Procedure: DILATATION AND EVACUATION;  Surgeon: [place] Mode, MD;  Location: [place] [facility];  Service: Gynecology;  Laterality: N/A;   FISTULA SUPERFICIALIZATION Left 01/20/2019   Procedure: Fistula Superficialization;  Surgeon: Angelia Mould, MD;  Location: [place];  Service: Vascular;  Laterality: Left;   HYDRADENITIS EXCISION Right    INSERTION OF DIALYSIS CATHETER Right 12/19/2021   Procedure: INSERTION OF A TUNNELED DIALYSIS CATHETER;  Surgeon: Waynetta Sandy, MD;  Location: [place];  Service: Vascular;  Laterality: Right;   RENAL BIOPSY     REVISION OF ARTERIOVENOUS GORETEX GRAFT Left 02/11/2013   Procedure: REVISION OF ARTERIOVENOUS GORTEX FISTULA;  Surgeon: Rosetta Posner, MD;  Location: [facility];  Service: Vascular;  Laterality: Left;   THROMBECTOMY W/ EMBOLECTOMY Left 05/25/2018   Procedure: REPAIR OF BLEEDING ARTERIOVENOUS FISTULA;  Surgeon: Serafina Mitchell, MD;  Location: [facility];  Service: Vascular;  Laterality: Left;   Patient Active Problem List   Diagnosis Date Noted   Asthma, persistent not controlled 04/30/2022   CAP (community acquired pneumonia) due to Pneumococcus ([place]) 02/03/2022   Chronic respiratory  failure with hypoxia ([place]) 02/03/2022   Anxiety and depression 02/03/2022   Gout 02/03/2022   HTN (hypertension) 02/03/2022   Community acquired pneumonia 02/03/2022   Volume overload 12/21/2021   Skin infection 12/20/2021   Class 1 obesity 12/20/2021   CHF (congestive heart failure) ([place]) 12/19/2021   Hypertensive urgency 12/05/2021   Prolonged QT interval 12/05/2021   Anxiety    Acute gastroenteritis 02/16/2021   GERD (gastroesophageal reflux disease) 01/11/2021   Hyponatremia 01/11/2021   Pulmonary edema 01/11/2021   ESRD (end stage renal disease) on dialysis ([place]) 06/02/2018    Fever 12/07/2017   Abdominal pain 08/03/2016   Chronic pain 01/21/2016   Muscle cramps 03/29/2015   Anemia of chronic kidney failure 09/15/2013   Hyperkalemia 08/29/2013   Cocaine abuse ([place]) 08/26/2013   Depression 04/21/2012   ESRD (end stage renal disease) ([place]) 08/03/2011    PCP: Harvie Junior, MD  REFERRING PROVIDER: Harvie Junior, MD  REFERRING DIAG: Weakness, FTT  THERAPY DIAG:  Muscle weakness (generalized)  Unsteadiness on feet  Difficulty in walking, not elsewhere classified  Pain in left leg  Pain in right leg  Rationale for Evaluation and Treatment: Rehabilitation  ONSET DATE: 05/28/22  SUBJECTIVE:   SUBJECTIVE STATEMENT: Patient reports that last August she started to retain fluid in her legs. They have had continuous difficulty managing the fluid. Over time, she started to develop pain in all of her joints in her lower body, along with stiffness and weakness. She cannot walk without supportive shoes. She is trying to remain as I as possible.  PERTINENT HISTORY: Dialysis, recent pneumonia, HTN, anxiety, migraines, depression, anemia, seizures, obesity, chronic respiratory failure. PAIN:  Are you having pain? Yes: NPRS scale: 10+/10 Pain location: Entire lower body Pain description: sharp, achy, feet burn, like she is breaking. If she sits too long, she feels stuck Aggravating factors: Being still, movement Relieving factors: no relief  PRECAUTIONS: Other: Dialysis T, Th, Sat  WEIGHT BEARING RESTRICTIONS: No  FALLS:  Has patient fallen in last 6 months? No  LIVING ENVIRONMENT: Lives with: lives with their family and lives with their spouse Lives in: House/apartment Stairs: No Has following equipment at home:  walking stick  OCCUPATION: disability  PLOF: Independent with gait and Needs assistance with ADLs  PATIENT GOALS: Patient would like to be able to take care of all of her ADL's I, including shower transfers, decrease  pain.  NEXT MD VISIT: unknown  OBJECTIVE:   DIAGNOSTIC FINDINGS: N/A  COGNITION: Overall cognitive status: Within functional limits for tasks assessed     SENSATION: Patient reports burning in feet and lower legs, sometimes all the way up to her hips when mobilizing.  EDEMA:  BLE edema with stiffness and TTP in muscles, reports her posterior muscles in Buttocks and legs are also tight and sore, unable to assess.  MUSCLE LENGTH: Hamstrings: Appear tight B Thomas test: Appear tight B, unable to stand up in full hip extension.  POSTURE: flexed trunk   PALPATION: TTP all over her lower body and lumbar paraspinals. Tissue tautness in BLE due to edema.  LOWER EXTREMITY ROM: All movements stiff and limited due to pain   LOWER EXTREMITY MMT: Able to move against gravity, unable to tolerate any resistance due to pain. Functionally, she demonstrates shakiness with sit to stand.  FUNCTIONAL TESTS:  30 seconds chair stand test 10 meter walk test: .488 m/sec  GAIT: Distance walked: In clinic distances Assistive device utilized: None Level of assistance: Modified independence  Comments: Patient reports that she was able to walk longer distance, in [employer] on her non dialysis day, but was severely fatigued and painful afterwards.   TODAY'S TREATMENT:                                                                                                                              DATE:  06/02/22  Education   PATIENT EDUCATION:  Education details: POC Person educated: Patient Education method: Explanation Education comprehension: verbalized understanding  HOME EXERCISE PROGRAM: TBD  ASSESSMENT:  CLINICAL IMPRESSION: Patient is a 47 y.o. who was seen today for physical therapy evaluation and treatment for Weakness. Patient arrived after receiving Dialysis, severely fatigued and with C/O pain with all movement, limiting her assessment. She reports BLE pain and edema, has tautness  in BLE due to the fluid retention. She does report that she was able to walk longer distances yesterday, but was fatigued and painful afterward. She will benefit from PT to address her pain, improve her ROM and strength, balance, activity tolerance, and functional mobility. Goals of increased safety and I with all mobility, decreased fall risk, decreased pain.  OBJECTIVE IMPAIRMENTS: decreased activity tolerance, decreased balance, decreased coordination, decreased endurance, decreased mobility, difficulty walking, decreased ROM, decreased strength, postural dysfunction, and pain.   ACTIVITY LIMITATIONS: carrying, lifting, bending, standing, squatting, sleeping, stairs, transfers, bed mobility, and locomotion level  PARTICIPATION LIMITATIONS: meal prep, cleaning, laundry, shopping, and community activity  PERSONAL FACTORS: Past/current experiences are also affecting patient's functional outcome.   REHAB POTENTIAL: Good  CLINICAL DECISION MAKING: Stable/uncomplicated  EVALUATION COMPLEXITY: HIGH   GOALS: Goals reviewed with patient? Yes  SHORT TERM GOALS: Target date: 06/13/22 Independent with initial HEP Baseline: Goal status: INITIAL  LONG TERM GOALS: Target date: 08/25/22  I with final HEP Baseline:  Goal status: INITIAL  2.  Increase BLE strength to at least 4/5 Baseline:  Goal status: INITIAL  3.  Patient will complete at least 8 reps during 30 sec STS test Baseline: 2 Goal status: INITIAL  4.  Patient will increase her gait speed as tested in 13M walk test to at least 1.39 m/sec Baseline: .463m/sec Goal status: INITIAL  5.  Patient will be able to perform all daily activities with pain < 4/10 Baseline: 10+/10 Goal status: INITIAL  6.  Patient will be able to walk at least 500' on level and unlevel surfaces, with LRAD, with no C/O residual pain or stiffness. Baseline: Inconsistently walks longer distances, but impaired for the rest of the day. Goal status:  INITIAL   PLAN:  PT FREQUENCY: 1-2x/week  PT DURATION: 12 weeks  PLANNED INTERVENTIONS: Therapeutic exercises, Therapeutic activity, Neuromuscular re-education, Balance training, Gait training, Patient/Family education, Self Care, Joint mobilization, Dry Needling, Electrical stimulation, Spinal mobilization, Cryotherapy, Moist heat, Taping, Ionotophoresis 4mg /ml Dexamethasone, and Manual therapy  PLAN FOR NEXT SESSION: Increase trunk mobility, initiate HEP   Marcelina Morel, DPT 06/02/2022, 2:29 PM

## 2022-06-02 ENCOUNTER — Ambulatory Visit: Payer: 59 | Attending: Nephrology | Admitting: Occupational Therapy

## 2022-06-02 ENCOUNTER — Ambulatory Visit: Payer: 59 | Admitting: Physical Therapy

## 2022-06-02 ENCOUNTER — Encounter: Payer: Self-pay | Admitting: Occupational Therapy

## 2022-06-02 DIAGNOSIS — R2681 Unsteadiness on feet: Secondary | ICD-10-CM | POA: Diagnosis present

## 2022-06-02 DIAGNOSIS — M79604 Pain in right leg: Secondary | ICD-10-CM | POA: Diagnosis present

## 2022-06-02 DIAGNOSIS — M25511 Pain in right shoulder: Secondary | ICD-10-CM | POA: Insufficient documentation

## 2022-06-02 DIAGNOSIS — M6281 Muscle weakness (generalized): Secondary | ICD-10-CM

## 2022-06-02 DIAGNOSIS — R262 Difficulty in walking, not elsewhere classified: Secondary | ICD-10-CM

## 2022-06-02 DIAGNOSIS — G8929 Other chronic pain: Secondary | ICD-10-CM | POA: Diagnosis present

## 2022-06-02 DIAGNOSIS — M25512 Pain in left shoulder: Secondary | ICD-10-CM | POA: Diagnosis present

## 2022-06-02 DIAGNOSIS — M79605 Pain in left leg: Secondary | ICD-10-CM

## 2022-06-02 DIAGNOSIS — R2689 Other abnormalities of gait and mobility: Secondary | ICD-10-CM

## 2022-06-10 ENCOUNTER — Ambulatory Visit: Payer: 59 | Admitting: Occupational Therapy

## 2022-06-10 ENCOUNTER — Ambulatory Visit: Payer: 59 | Admitting: Physical Therapy

## 2022-06-15 ENCOUNTER — Encounter: Payer: Self-pay | Admitting: Occupational Therapy

## 2022-06-15 ENCOUNTER — Ambulatory Visit: Payer: 59

## 2022-06-15 ENCOUNTER — Ambulatory Visit: Payer: 59 | Attending: Nephrology | Admitting: Occupational Therapy

## 2022-06-15 DIAGNOSIS — R262 Difficulty in walking, not elsewhere classified: Secondary | ICD-10-CM | POA: Insufficient documentation

## 2022-06-15 DIAGNOSIS — M79604 Pain in right leg: Secondary | ICD-10-CM

## 2022-06-15 DIAGNOSIS — R2681 Unsteadiness on feet: Secondary | ICD-10-CM | POA: Diagnosis present

## 2022-06-15 DIAGNOSIS — G8929 Other chronic pain: Secondary | ICD-10-CM | POA: Insufficient documentation

## 2022-06-15 DIAGNOSIS — M25511 Pain in right shoulder: Secondary | ICD-10-CM | POA: Insufficient documentation

## 2022-06-15 DIAGNOSIS — M6281 Muscle weakness (generalized): Secondary | ICD-10-CM | POA: Insufficient documentation

## 2022-06-15 DIAGNOSIS — R2689 Other abnormalities of gait and mobility: Secondary | ICD-10-CM | POA: Insufficient documentation

## 2022-06-15 DIAGNOSIS — M79605 Pain in left leg: Secondary | ICD-10-CM | POA: Diagnosis present

## 2022-06-15 DIAGNOSIS — M25512 Pain in left shoulder: Secondary | ICD-10-CM | POA: Insufficient documentation

## 2022-06-15 NOTE — Patient Instructions (Addendum)
   Lie on back holding wand. Raise arms to eye level Hold 5sec. Repeat 10 times per set.  Do 2   ROM: Abduction - Wand laying down   Holding wand with left hand palm up, push wand directly out to side, leading with other hand palm down, until stretch is felt. Hold 5 seconds. Repeat 10 times per set. Do 2-3 sessions per day. (Lying down)    Press-Up With Wand   Press wand up until elbows are straight, then reach wand over head to a pain free range. Hold 5 seconds. Repeat 10 times. Do 2 sessions per day.    ELBOW: Flexion (Cane)    Laying down Hold cane with both hands. Bend and straighten elbows. Hold __5_ seconds. No weight on cane  _10__ reps per set, 2___ sets per day, __7_ days per week  Copyright  VHI. All rights reserved.

## 2022-06-15 NOTE — Therapy (Signed)
OUTPATIENT PHYSICAL THERAPY LOWER EXTREMITY TREATMENT   Patient Name: Desiree [name] MRN: [id number] DOB:[birth year], 47 y.o., female Today's Date: 06/15/2022  END OF SESSION:  PT End of Session - 06/15/22 1316     Visit Number 2    Date for PT Re-Evaluation 08/25/22    PT Start Time 1315    PT Stop Time 1400    PT Time Calculation (min) 45 min    Activity Tolerance Patient limited by fatigue;Patient limited by pain    Behavior During Therapy [facility] for tasks assessed/performed              Past Medical History:  Diagnosis Date   Anemia    Anxiety    panic attacks   Arthritis    bilateral knees   Childhood asthma    Complication of anesthesia    "sometimes it does not work; didn't during LEEP OR" (01/21/2016)   Depression    no med   ESRD (end stage renal disease) on dialysis    "TTS; [facility]; [facility]" (08/03/2016)   GERD (gastroesophageal reflux disease)    nexium prn   Gout    History of blood transfusion    "related to kidneys; I've had 4" (01/21/2016)   Hypertension    Migraine    last one 01/18/19   Preterm labor ~ 2014   Retina hole, left    Seizures    "last one was in 2000; related to preeclampsia" (01/21/2016)   Past Surgical History:  Procedure Laterality Date   A/V FISTULAGRAM Left 12/19/2021   Procedure: A/V Fistulagram;  Surgeon: Angelia Mould, MD;  Location: [place] [facility];  Service: Cardiovascular;  Laterality: Left;   AV FISTULA PLACEMENT Left 2010   AV FISTULA PLACEMENT Left 01/20/2019   Procedure: ARTERIOVENOUS (AV) FISTULA CREATION LEFT UPPER ARM;  Surgeon: Angelia Mould, MD;  Location: [place];  Service: Vascular;  Laterality: Left;   AV FISTULA PLACEMENT Left 12/19/2021   Procedure: ARTERIOVENOUS (AV) FISTULA CREATION POSSIBLE TUNNELED DIALYSIS;  Surgeon: Waynetta Sandy, MD;  Location: [place];  Service: Vascular;  Laterality: Left;   CERVICAL BIOPSY  W/ LOOP ELECTRODE EXCISION  2001   DILATION AND  EVACUATION  08/02/2011   Procedure: DILATATION AND EVACUATION;  Surgeon: Logan Bores, MD;  Location: [place] [facility];  Service: Gynecology;;   DILATION AND EVACUATION N/A 08/31/2013   Procedure: DILATATION AND EVACUATION;  Surgeon: Woodroe Mode, MD;  Location: [place] [facility];  Service: Gynecology;  Laterality: N/A;   FISTULA SUPERFICIALIZATION Left 01/20/2019   Procedure: Fistula Superficialization;  Surgeon: Angelia Mould, MD;  Location: [place];  Service: Vascular;  Laterality: Left;   HYDRADENITIS EXCISION Right    INSERTION OF DIALYSIS CATHETER Right 12/19/2021   Procedure: INSERTION OF A TUNNELED DIALYSIS CATHETER;  Surgeon: Waynetta Sandy, MD;  Location: [place];  Service: Vascular;  Laterality: Right;   RENAL BIOPSY     REVISION OF ARTERIOVENOUS GORETEX GRAFT Left 02/11/2013   Procedure: REVISION OF ARTERIOVENOUS GORTEX FISTULA;  Surgeon: Rosetta Posner, MD;  Location: [facility];  Service: Vascular;  Laterality: Left;   THROMBECTOMY W/ EMBOLECTOMY Left 05/25/2018   Procedure: REPAIR OF BLEEDING ARTERIOVENOUS FISTULA;  Surgeon: Serafina Mitchell, MD;  Location: [facility] OR;  Service: Vascular;  Laterality: Left;   Patient Active Problem List   Diagnosis Date Noted   Asthma, persistent not controlled 04/30/2022   CAP (community acquired pneumonia) due to Pneumococcus 02/03/2022   Chronic respiratory failure with  hypoxia 02/03/2022   Anxiety and depression 02/03/2022   Gout 02/03/2022   HTN (hypertension) 02/03/2022   Community acquired pneumonia 02/03/2022   Volume overload 12/21/2021   Skin infection 12/20/2021   Class 1 obesity 12/20/2021   CHF (congestive heart failure) 12/19/2021   Hypertensive urgency 12/05/2021   Prolonged QT interval 12/05/2021   Anxiety    Acute gastroenteritis 02/16/2021   GERD (gastroesophageal reflux disease) 01/11/2021   Hyponatremia 01/11/2021   Pulmonary edema 01/11/2021   ESRD (end stage renal disease) on dialysis 06/02/2018   Fever 12/07/2017    Abdominal pain 08/03/2016   Chronic pain 01/21/2016   Muscle cramps 03/29/2015   Anemia of chronic kidney failure 09/15/2013   Hyperkalemia 08/29/2013   Cocaine abuse 08/26/2013   Depression 04/21/2012   ESRD (end stage renal disease) 08/03/2011    PCP: Harvie Junior, MD  REFERRING PROVIDER: Harvie Junior, MD  REFERRING DIAG: Weakness, FTT  THERAPY DIAG:  Muscle weakness (generalized)  Unsteadiness on feet  Difficulty in walking, not elsewhere classified  Pain in left leg  Pain in right leg  Other abnormalities of gait and mobility  Rationale for Evaluation and Treatment: Rehabilitation  ONSET DATE: 05/28/22  SUBJECTIVE:   SUBJECTIVE STATEMENT: I am hurting everyone and I got a headache. I just had a panic attack and took something for it a few mins ago.   PERTINENT HISTORY: Dialysis, recent pneumonia, HTN, anxiety, migraines, depression, anemia, seizures, obesity, chronic respiratory failure. PAIN:  Are you having pain? Yes: NPRS scale: 10/10 Pain location: Entire lower body Pain description: sharp, achy, feet burn, like she is breaking. If she sits too long, she feels stuck Aggravating factors: Being still, movement Relieving factors: no relief  PRECAUTIONS: Other: Dialysis T, Th, Sat  WEIGHT BEARING RESTRICTIONS: No  FALLS:  Has patient fallen in last 6 months? No  LIVING ENVIRONMENT: Lives with: lives with their family and lives with their spouse Lives in: House/apartment Stairs: No Has following equipment at home:  walking stick  OCCUPATION: disability  PLOF: Independent with gait and Needs assistance with ADLs  PATIENT GOALS: Patient would like to be able to take care of all of her ADL's I, including shower transfers, decrease pain.  NEXT MD VISIT: unknown  OBJECTIVE:   DIAGNOSTIC FINDINGS: N/A  COGNITION: Overall cognitive status: Within functional limits for tasks assessed     SENSATION: Patient reports burning in feet and  lower legs, sometimes all the way up to her hips when mobilizing.  EDEMA:  BLE edema with stiffness and TTP in muscles, reports her posterior muscles in Buttocks and legs are also tight and sore, unable to assess.  MUSCLE LENGTH: Hamstrings: Appear tight B Thomas test: Appear tight B, unable to stand up in full hip extension.  POSTURE: flexed trunk   PALPATION: TTP all over her lower body and lumbar paraspinals. Tissue tautness in BLE due to edema.  LOWER EXTREMITY ROM: All movements stiff and limited due to pain   LOWER EXTREMITY MMT: Able to move against gravity, unable to tolerate any resistance due to pain. Functionally, she demonstrates shakiness with sit to stand.  FUNCTIONAL TESTS:  30 seconds chair stand test 10 meter walk test: .488 m/sec  GAIT: Distance walked: In clinic distances Assistive device utilized: None Level of assistance: Modified independence Comments: Patient reports that she was able to walk longer distance, in [employer] on her non dialysis day, but was severely fatigued and painful afterwards.   TODAY'S TREATMENT:  DATE:  06/15/22 LAQ no weight 2x10 HS curls yellow 2x10  Hip abd yellow 2x10 Ball squeezes 2x10 Seated marches 2x10  Seated heel raises 2x10 Seated toe raises 2x10 NuStep L2 x58mins    06/02/22  Education   PATIENT EDUCATION:  Education details: POC Person educated: Patient Education method: Explanation Education comprehension: verbalized understanding  HOME EXERCISE PROGRAM: TBD  ASSESSMENT:  CLINICAL IMPRESSION: Patient arrives in high pain levels, she reports she had a panic attack out in the lobby while waiting before her appointment and took some medicine. We were limited in the session due to fatigue and pain. Did mostly seated exercises just working on some light mobility and strengthening. Was  able to do the NuStep today on level 2 for 5 minutes.    OBJECTIVE IMPAIRMENTS: decreased activity tolerance, decreased balance, decreased coordination, decreased endurance, decreased mobility, difficulty walking, decreased ROM, decreased strength, postural dysfunction, and pain.   ACTIVITY LIMITATIONS: carrying, lifting, bending, standing, squatting, sleeping, stairs, transfers, bed mobility, and locomotion level  PARTICIPATION LIMITATIONS: meal prep, cleaning, laundry, shopping, and community activity  PERSONAL FACTORS: Past/current experiences are also affecting patient's functional outcome.   REHAB POTENTIAL: Good  CLINICAL DECISION MAKING: Stable/uncomplicated  EVALUATION COMPLEXITY: HIGH   GOALS: Goals reviewed with patient? Yes  SHORT TERM GOALS: Target date: 06/13/22 Independent with initial HEP Baseline: Goal status: INITIAL  LONG TERM GOALS: Target date: 08/25/22  I with final HEP Baseline:  Goal status: INITIAL  2.  Increase BLE strength to at least 4/5 Baseline:  Goal status: INITIAL  3.  Patient will complete at least 8 reps during 30 sec STS test Baseline: 2 Goal status: INITIAL  4.  Patient will increase her gait speed as tested in 72M walk test to at least 1.39 m/sec Baseline: .475m/sec Goal status: INITIAL  5.  Patient will be able to perform all daily activities with pain < 4/10 Baseline: 10+/10 Goal status: INITIAL  6.  Patient will be able to walk at least 500' on level and unlevel surfaces, with LRAD, with no C/O residual pain or stiffness. Baseline: Inconsistently walks longer distances, but impaired for the rest of the day. Goal status: INITIAL   PLAN:  PT FREQUENCY: 1-2x/week  PT DURATION: 12 weeks  PLANNED INTERVENTIONS: Therapeutic exercises, Therapeutic activity, Neuromuscular re-education, Balance training, Gait training, Patient/Family education, Self Care, Joint mobilization, Dry Needling, Electrical stimulation, Spinal  mobilization, Cryotherapy, Moist heat, Taping, Ionotophoresis 4mg /ml Dexamethasone, and Manual therapy  PLAN FOR NEXT SESSION: Increase trunk mobility, initiate HEP, work on [employer], DPT 06/15/2022, 1:55 PM

## 2022-06-15 NOTE — Therapy (Signed)
OUTPATIENT OCCUPATIONAL THERAPY NEURO EVALUATION  Patient Name: DAMETRA HEFFERN MRN: [id number] DOB:May 15, 1975, 47 y.o., female Today's Date: 06/15/2022  PCP: Dr. Jimmye Norman REFERRING PROVIDER: Dr. Moshe Cipro  END OF SESSION:  OT End of Session - 06/15/22 1315     Visit Number 2    Number of Visits 25    Date for OT Re-Evaluation 08/25/22    Authorization Type [employer] Medicare    OT Start Time 1355    OT Stop Time 1430    OT Time Calculation (min) 35 min    Activity Tolerance Patient limited by fatigue;Patient limited by pain    Behavior During Therapy [facility] for tasks assessed/performed              Past Medical History:  Diagnosis Date   Anemia    Anxiety    panic attacks   Arthritis    bilateral knees   Childhood asthma    Complication of anesthesia    "sometimes it does not work; didn't during LEEP OR" (01/21/2016)   Depression    no med   ESRD (end stage renal disease) on dialysis    "TTS; [facility]" (08/03/2016)   GERD (gastroesophageal reflux disease)    nexium prn   Gout    History of blood transfusion    "related to kidneys; I've had 4" (01/21/2016)   Hypertension    Migraine    last one 01/18/19   Preterm labor ~ 2014   Retina hole, left    Seizures    "last one was in 2000; related to preeclampsia" (01/21/2016)   Past Surgical History:  Procedure Laterality Date   A/V FISTULAGRAM Left 12/19/2021   Procedure: A/V Fistulagram;  Surgeon: Angelia Mould, MD;  Location: [place] [facility];  Service: Cardiovascular;  Laterality: Left;   AV FISTULA PLACEMENT Left 2010   AV FISTULA PLACEMENT Left 01/20/2019   Procedure: ARTERIOVENOUS (AV) FISTULA CREATION LEFT UPPER ARM;  Surgeon: Angelia Mould, MD;  Location: [place];  Service: Vascular;  Laterality: Left;   AV FISTULA PLACEMENT Left 12/19/2021   Procedure: ARTERIOVENOUS (AV) FISTULA CREATION POSSIBLE TUNNELED DIALYSIS;  Surgeon: Waynetta Sandy, MD;  Location: [place];   Service: Vascular;  Laterality: Left;   CERVICAL BIOPSY  W/ LOOP ELECTRODE EXCISION  2001   DILATION AND EVACUATION  08/02/2011   Procedure: DILATATION AND EVACUATION;  Surgeon: Logan Bores, MD;  Location: [place] [facility];  Service: Gynecology;;   DILATION AND EVACUATION N/A 08/31/2013   Procedure: DILATATION AND EVACUATION;  Surgeon: Woodroe Mode, MD;  Location: [place] [facility];  Service: Gynecology;  Laterality: N/A;   FISTULA SUPERFICIALIZATION Left 01/20/2019   Procedure: Fistula Superficialization;  Surgeon: Angelia Mould, MD;  Location: [place];  Service: Vascular;  Laterality: Left;   HYDRADENITIS EXCISION Right    INSERTION OF DIALYSIS CATHETER Right 12/19/2021   Procedure: INSERTION OF A TUNNELED DIALYSIS CATHETER;  Surgeon: Waynetta Sandy, MD;  Location: [place];  Service: Vascular;  Laterality: Right;   RENAL BIOPSY     REVISION OF ARTERIOVENOUS GORETEX GRAFT Left 02/11/2013   Procedure: REVISION OF ARTERIOVENOUS GORTEX FISTULA;  Surgeon: Rosetta Posner, MD;  Location: [facility];  Service: Vascular;  Laterality: Left;   THROMBECTOMY W/ EMBOLECTOMY Left 05/25/2018   Procedure: REPAIR OF BLEEDING ARTERIOVENOUS FISTULA;  Surgeon: Serafina Mitchell, MD;  Location: [facility];  Service: Vascular;  Laterality: Left;   Patient Active Problem List   Diagnosis Date Noted   Asthma,  persistent not controlled 04/30/2022   CAP (community acquired pneumonia) due to Pneumococcus 02/03/2022   Chronic respiratory failure with hypoxia 02/03/2022   Anxiety and depression 02/03/2022   Gout 02/03/2022   HTN (hypertension) 02/03/2022   Community acquired pneumonia 02/03/2022   Volume overload 12/21/2021   Skin infection 12/20/2021   Class 1 obesity 12/20/2021   CHF (congestive heart failure) 12/19/2021   Hypertensive urgency 12/05/2021   Prolonged QT interval 12/05/2021   Anxiety    Acute gastroenteritis 02/16/2021   GERD (gastroesophageal reflux disease) 01/11/2021   Hyponatremia 01/11/2021    Pulmonary edema 01/11/2021   ESRD (end stage renal disease) on dialysis 06/02/2018   Fever 12/07/2017   Abdominal pain 08/03/2016   Chronic pain 01/21/2016   Muscle cramps 03/29/2015   Anemia of chronic kidney failure 09/15/2013   Hyperkalemia 08/29/2013   Cocaine abuse 08/26/2013   Depression 04/21/2012   ESRD (end stage renal disease) 08/03/2011    ONSET DATE: 05/21/22  REFERRING DIAG: weakness, FTT in dialysis pctn   THERAPY DIAG:  Muscle weakness (generalized)  Unsteadiness on feet  Chronic left shoulder pain  Chronic right shoulder pain  Other abnormalities of gait and mobility  Rationale for Evaluation and Treatment: Rehabilitation  SUBJECTIVE:   SUBJECTIVE STATEMENT: Pt reports  continued generalized pain Pt accompanied by: self  PERTINENT HISTORY: PMH - ESRD on HD T/TH/Sat since 2010, nephrotic syndrome, anxiety and depression, GERD, hypertension  chronic respiratory failure 3 L oxygen,Cocaine and THC abuse Pt. was hospitalized 02/03/2022 for increasing dyspnea and oxygen needs and treated for community-acquired pneumonia and improved after dialysis.    PRECAUTIONS: Other: dialysis T/TH/Sat , 3 L O2, fall risk  WEIGHT BEARING RESTRICTIONS: no  PAIN:  Are you having pain? Yes: NPRS scale: 10/10 Pain location: shoulders and LE's Pain description: aching Aggravating factors: movement Relieving factors: rest, sleep  FALLS: Has patient fallen in last 6 months? No  LIVING ENVIRONMENT: Lives with: lives with their family and lives with their spouse Lives in: House/apartment Stairs: No Has following equipment at home: None  PLOF: Independent prior to October  PATIENT GOALS: improve use of arms and decrease pain  OBJECTIVE:   HAND DOMINANCE: Right  ADLs: Overall ADLs: increased time required Transfers/ambulation related to ADLs: Eating: pt is able to feed herself but she is not hungry Grooming: mod I UB Dressing: mod I shirt , needs help with  jacket(mod A) LB Dressing: max-dependent with LB dressing Toileting: performs mod I with difficulty Bathing: supervision Tub Shower transfers:min A bathtub shower Equipment: none Therapist called and spoke with SW at [facility] and requested order for 3 in 1 commode for patient. SW will request from MD and Patient to discuss with her on Thursday.  IADLs: Shopping: dependent Light housekeeping: dependent Meal Prep: dependent Community mobility: supervision Medication management: Pt handles   MOBILITY STATUS: independent with increased time required    ACTIVITY TOLERANCE: Activity tolerance: Pt reports that she tolerateds 5 mins or less standing for functional activity due to pain.  FUNCTIONAL OUTCOME MEASURES: Quick Dash: 85% disability  UPPER EXTREMITY ROM:  Pt demonstrates decreased A/ROM R thumb flexion, she reports catching in car door  Active ROM Right eval Left eval  Shoulder flexion 80 70  Shoulder abduction 80 65  Shoulder adduction    Shoulder extension    Shoulder internal rotation    Shoulder external rotation    Elbow flexion WFL 110  Elbow extension -65 -40  Wrist flexion 50% [facility]  Wrist extension 50% WFL  Wrist ulnar deviation    Wrist radial deviation    Wrist pronation    Wrist supination    (Blank rows = not tested)  HAND FUNCTION: Grip strength: Right: 8 lbs; Left: 14 lbs  COORDINATION:NT   SENSATION: Not tested  EDEMA: Pt reports edema in bilateral legs    COGNITION: Overall cognitive status: Within functional limits for tasks assessed   VISION ASSESSMENT: Not tested- 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   TODAY'S TREATMENT:                                                                                                                              DATE:06/15/22- Pt transitioned seated to supine on wedge with min A for LE's Supine closed  chain shoulder flexion 2 sets 5 reps, chest press 2 sets 5 reps , shoulder abduction 10 reps each side, biceps curls 2 sets of 10 reps, min v.c Pt practiced donning/ doffing socks with reacher and sockaide following instruction by OT. Pt returned demonstration of use. Pt requested to end therapy early due to fatigue. Pt reports purchasing tub bench and 3 in 1 commode for use at home. O2 sats 100%, HR 80 BPM  Education details: supine HEP with cane   Person educated: Patient  Education: initial HEP, use of sockaide and Chartered certified accountant method: Explanation, demonstration, handout, v.c. Education comprehension: verbalized understanding, returned demonstration  HOME EXERCISE PROGRAM: N/a   GOALS: Potential Goals reviewed with patient? Yes  SHORT TERM GOALS: Target date: 07/02/22  I with initial HEP Baseline:dependent Goal status: INITIAL  2.  I with AE strategies for ADLS and DME recommendations (ie: 3 in -1 and tub bench) Baseline: dependent Goal status: INITIAL  3.  Pt will increase bilateral shoulder flexion by 5* for increased functional reach Baseline: RUE 80*, LUE 70* Goal status: INITIAL  4.  Pt will perform LB dressing with mod A using AE prn. Baseline: max-dependent Goal status: INITIAL  5.  Pt will perform functional activity in standing x 8 mins prior to rest break. Baseline: stands 5 mins or less prior to rest break. Goal status: INITIAL  6.  Pt will report bilateral UE pain is no greater than 6/10 for ADLS. Baseline: 10/10 pain Goal status: INITIAL  LONG TERM GOALS: Target date: 08/25/22  I with updated HEP Baseline: dependent Goal status: INITIAL  2.  Pt will donn jacket mod I Baseline: mod A Goal status: INITIAL  3.  Pt. Will perform LB dressing min A with AE prn. Baseline: max- dependent Goal status: INITIAL  4.  Pt will increase bilateral grip strength by 8 lbs for increased functional use. Baseline: RUE 8 lbs, LUE 14 lbs. Goal status:  INITIAL  5.  Pt will perform functional activity in standing x 12 mins prior to rest break. Baseline: stands for 5 mins  or less prior to rest break Goal status: INITIAL  6.  Pt will perform light home management tasks modified independently Baseline: dependent Goal status: INITIAL  ASSESSMENT:  CLINICAL IMPRESSION: Pt is progressing towards goals. She is limited by endurance and pain. PERFORMANCE DEFICITS: in functional skills including ,  and psychosocial skills including coping strategies, environmental adaptation, habits, interpersonal interactions, and routines and behaviors.   IMPAIRMENTS: are limiting patient from ADLs, IADLs, rest and sleep, play, leisure, and social participation.   CO-MORBIDITIES: may have co-morbidities  that affects occupational performance. Patient will benefit from skilled OT to address above impairments and improve overall function.  MODIFICATION OR ASSISTANCE TO COMPLETE EVALUATION: Min-Moderate modification of tasks or assist with assess necessary to complete an evaluation.  OT OCCUPATIONAL PROFILE AND HISTORY: Detailed assessment: Review of records and additional review of physical, cognitive, psychosocial history related to current functional performance.  CLINICAL DECISION MAKING: Moderate - several treatment options, min-mod task modification necessary  REHAB POTENTIAL: Good  EVALUATION COMPLEXITY: Moderate    PLAN:  OT FREQUENCY: 2x/week  OT DURATION: 12 weeksplus eval  PLANNED INTERVENTIONS: self care/ADL training, therapeutic exercise, therapeutic activity, neuromuscular re-education, manual therapy, passive range of motion, balance training, functional mobility training, ultrasound, paraffin, moist heat, cryotherapy, contrast bath, patient/family education, energy conservation, coping strategies training, DME and/or AE instructions, and Re-evaluation  RECOMMENDED OTHER SERVICES: PT  CONSULTED AND AGREED WITH PLAN OF CARE:  Patient  PLAN FOR NEXT SESSION: add to HEP prn, continue ADL strategies. Merrily Tegeler, OT 06/15/2022, 4:02 PM

## 2022-06-17 ENCOUNTER — Ambulatory Visit: Payer: 59 | Admitting: Physical Therapy

## 2022-06-17 ENCOUNTER — Ambulatory Visit: Payer: 59 | Admitting: Occupational Therapy

## 2022-06-17 ENCOUNTER — Encounter: Payer: Self-pay | Admitting: Physical Therapy

## 2022-06-17 DIAGNOSIS — M79604 Pain in right leg: Secondary | ICD-10-CM

## 2022-06-17 DIAGNOSIS — M6281 Muscle weakness (generalized): Secondary | ICD-10-CM

## 2022-06-17 DIAGNOSIS — R262 Difficulty in walking, not elsewhere classified: Secondary | ICD-10-CM

## 2022-06-17 DIAGNOSIS — G8929 Other chronic pain: Secondary | ICD-10-CM

## 2022-06-17 DIAGNOSIS — M79605 Pain in left leg: Secondary | ICD-10-CM

## 2022-06-17 DIAGNOSIS — R2689 Other abnormalities of gait and mobility: Secondary | ICD-10-CM

## 2022-06-17 DIAGNOSIS — R2681 Unsteadiness on feet: Secondary | ICD-10-CM

## 2022-06-17 NOTE — Patient Instructions (Signed)
1. Grip Strengthening (Resistive Putty)   Squeeze putty using thumb and all fingers. Repeat _20___ times. Do __2__ sessions per day.   2. Roll putty into tube on table and pinch between first two fingers and thumb x 10 reps. Do 2 sessions per day     Copyright  VHI. All rights reserved.     

## 2022-06-17 NOTE — Therapy (Signed)
OUTPATIENT PHYSICAL THERAPY LOWER EXTREMITY TREATMENT   Patient Name: Desiree [name] MRN: [id number] DOB:04/17/1975, 47 y.o., female Today's Date: 06/17/2022  END OF SESSION:  PT End of Session - 06/17/22 1323     Visit Number 3    Date for PT Re-Evaluation 08/25/22    PT Start Time 1316    PT Stop Time 1355    PT Time Calculation (min) 39 min    Activity Tolerance Patient limited by fatigue;Patient limited by pain    Behavior During Therapy [facility] for tasks assessed/performed              Past Medical History:  Diagnosis Date   Anemia    Anxiety    panic attacks   Arthritis    bilateral knees   Childhood asthma    Complication of anesthesia    "sometimes it does not work; didn't during LEEP OR" (01/21/2016)   Depression    no med   ESRD (end stage renal disease) on dialysis    "TTS; [facility]; [facility]" (08/03/2016)   GERD (gastroesophageal reflux disease)    nexium prn   Gout    History of blood transfusion    "related to kidneys; I've had 4" (01/21/2016)   Hypertension    Migraine    last one 01/18/19   Preterm labor ~ 2014   Retina hole, left    Seizures    "last one was in 2000; related to preeclampsia" (01/21/2016)   Past Surgical History:  Procedure Laterality Date   A/V FISTULAGRAM Left 12/19/2021   Procedure: A/V Fistulagram;  Surgeon: Angelia Mould, MD;  Location: [place] [facility];  Service: Cardiovascular;  Laterality: Left;   AV FISTULA PLACEMENT Left 2010   AV FISTULA PLACEMENT Left 01/20/2019   Procedure: ARTERIOVENOUS (AV) FISTULA CREATION LEFT UPPER ARM;  Surgeon: Angelia Mould, MD;  Location: [place];  Service: Vascular;  Laterality: Left;   AV FISTULA PLACEMENT Left 12/19/2021   Procedure: ARTERIOVENOUS (AV) FISTULA CREATION POSSIBLE TUNNELED DIALYSIS;  Surgeon: Waynetta Sandy, MD;  Location: [place];  Service: Vascular;  Laterality: Left;   CERVICAL BIOPSY  W/ LOOP ELECTRODE EXCISION  2001   DILATION AND  EVACUATION  08/02/2011   Procedure: DILATATION AND EVACUATION;  Surgeon: Logan Bores, MD;  Location: [place] [facility];  Service: Gynecology;;   DILATION AND EVACUATION N/A 08/31/2013   Procedure: DILATATION AND EVACUATION;  Surgeon: Woodroe Mode, MD;  Location: [place] [facility];  Service: Gynecology;  Laterality: N/A;   FISTULA SUPERFICIALIZATION Left 01/20/2019   Procedure: Fistula Superficialization;  Surgeon: Angelia Mould, MD;  Location: [place];  Service: Vascular;  Laterality: Left;   HYDRADENITIS EXCISION Right    INSERTION OF DIALYSIS CATHETER Right 12/19/2021   Procedure: INSERTION OF A TUNNELED DIALYSIS CATHETER;  Surgeon: Waynetta Sandy, MD;  Location: [place];  Service: Vascular;  Laterality: Right;   RENAL BIOPSY     REVISION OF ARTERIOVENOUS GORETEX GRAFT Left 02/11/2013   Procedure: REVISION OF ARTERIOVENOUS GORTEX FISTULA;  Surgeon: Rosetta Posner, MD;  Location: [facility];  Service: Vascular;  Laterality: Left;   THROMBECTOMY W/ EMBOLECTOMY Left 05/25/2018   Procedure: REPAIR OF BLEEDING ARTERIOVENOUS FISTULA;  Surgeon: Serafina Mitchell, MD;  Location: [facility];  Service: Vascular;  Laterality: Left;   Patient Active Problem List   Diagnosis Date Noted   Asthma, persistent not controlled 04/30/2022   CAP (community acquired pneumonia) due to Pneumococcus 02/03/2022   Chronic respiratory failure with  hypoxia 02/03/2022   Anxiety and depression 02/03/2022   Gout 02/03/2022   HTN (hypertension) 02/03/2022   Community acquired pneumonia 02/03/2022   Volume overload 12/21/2021   Skin infection 12/20/2021   Class 1 obesity 12/20/2021   CHF (congestive heart failure) 12/19/2021   Hypertensive urgency 12/05/2021   Prolonged QT interval 12/05/2021   Anxiety    Acute gastroenteritis 02/16/2021   GERD (gastroesophageal reflux disease) 01/11/2021   Hyponatremia 01/11/2021   Pulmonary edema 01/11/2021   ESRD (end stage renal disease) on dialysis 06/02/2018   Fever 12/07/2017    Abdominal pain 08/03/2016   Chronic pain 01/21/2016   Muscle cramps 03/29/2015   Anemia of chronic kidney failure 09/15/2013   Hyperkalemia 08/29/2013   Cocaine abuse 08/26/2013   Depression 04/21/2012   ESRD (end stage renal disease) 08/03/2011    PCP: Harvie Junior, MD  REFERRING PROVIDER: Harvie Junior, MD  REFERRING DIAG: Weakness, FTT  THERAPY DIAG:  Muscle weakness (generalized)  Unsteadiness on feet  Difficulty in walking, not elsewhere classified  Pain in right leg  Pain in left leg  Rationale for Evaluation and Treatment: Rehabilitation  ONSET DATE: 05/28/22  SUBJECTIVE:   SUBJECTIVE STATEMENT: Patient reports continued pain and fatigue. She performed HEP yesterday, which was very hard, but she got through it.  PERTINENT HISTORY: Dialysis, recent pneumonia, HTN, anxiety, migraines, depression, anemia, seizures, obesity, chronic respiratory failure. PAIN:  Are you having pain? Yes: NPRS scale: 10/10 Pain location: Entire lower body Pain description: sharp, achy, feet burn, like she is breaking. If she sits too long, she feels stuck Aggravating factors: Being still, movement Relieving factors: no relief  PRECAUTIONS: Other: Dialysis T, Th, Sat  WEIGHT BEARING RESTRICTIONS: No  FALLS:  Has patient fallen in last 6 months? No  LIVING ENVIRONMENT: Lives with: lives with their family and lives with their spouse Lives in: House/apartment Stairs: No Has following equipment at home:  walking stick  OCCUPATION: disability  PLOF: Independent with gait and Needs assistance with ADLs  PATIENT GOALS: Patient would like to be able to take care of all of her ADL's I, including shower transfers, decrease pain.  NEXT MD VISIT: unknown  OBJECTIVE:   DIAGNOSTIC FINDINGS: N/A  COGNITION: Overall cognitive status: Within functional limits for tasks assessed     SENSATION: Patient reports burning in feet and lower legs, sometimes all the way up to  her hips when mobilizing.  EDEMA:  BLE edema with stiffness and TTP in muscles, reports her posterior muscles in Buttocks and legs are also tight and sore, unable to assess.  MUSCLE LENGTH: Hamstrings: Appear tight B Thomas test: Appear tight B, unable to stand up in full hip extension.  POSTURE: flexed trunk   PALPATION: TTP all over her lower body and lumbar paraspinals. Tissue tautness in BLE due to edema.  LOWER EXTREMITY ROM: All movements stiff and limited due to pain   LOWER EXTREMITY MMT: Able to move against gravity, unable to tolerate any resistance due to pain. Functionally, she demonstrates shakiness with sit to stand.  FUNCTIONAL TESTS:  30 seconds chair stand test 10 meter walk test: .488 m/sec  GAIT: Distance walked: In clinic distances Assistive device utilized: None Level of assistance: Modified independence Comments: Patient reports that she was able to walk longer distance, in [employer] on her non dialysis day, but was severely fatigued and painful afterwards.   TODAY'S TREATMENT:  DATE:  06/17/22 NuStep L2 x 5 minutes. Seated lateral weight shifts, sliding hand out away from trunk and back, 2 x 5 each way. Seated long kicks for streth and strengthen, then hold knee in ext and PF/DF x 5, 2 x each leg. Attempted lateral flexion onto elbow and back up, but she reports that her elbows are too sensitive.Rotate to R, placing both hands on mat, slide L hand forward as far as possible and back, 2 x 5 reps to each side. Seated trunk ext with hands on mat behind hips, slide back and return to start x 5. Standing weight shifts with RW, lateral and ant/post x 5 reps each, reported knee pain. Standing side to side stepping with RW, x 10 reps each way. Flexion over large physioball for LB stretch, straight x 10, 5 reps to each side Sit to stand from  elevated mat with BUE support on RW, encouraged to rely on legs, 10 reps.  06/15/22 LAQ no weight 2x10 HS curls yellow 2x10  Hip abd yellow 2x10 Ball squeezes 2x10 Seated marches 2x10  Seated heel raises 2x10 Seated toe raises 2x10 NuStep L2 x30mins    06/02/22  Education   PATIENT EDUCATION:  Education details: POC Person educated: Patient Education method: Explanation Education comprehension: verbalized understanding  HOME EXERCISE PROGRAM: TBD  ASSESSMENT:  CLINICAL IMPRESSION: Patient arrives in high pain levels. However, she did tolerate slightly more activity today, focused on overall stretching and strengthening.   OBJECTIVE IMPAIRMENTS: decreased activity tolerance, decreased balance, decreased coordination, decreased endurance, decreased mobility, difficulty walking, decreased ROM, decreased strength, postural dysfunction, and pain.   ACTIVITY LIMITATIONS: carrying, lifting, bending, standing, squatting, sleeping, stairs, transfers, bed mobility, and locomotion level  PARTICIPATION LIMITATIONS: meal prep, cleaning, laundry, shopping, and community activity  PERSONAL FACTORS: Past/current experiences are also affecting patient's functional outcome.   REHAB POTENTIAL: Good  CLINICAL DECISION MAKING: Stable/uncomplicated  EVALUATION COMPLEXITY: HIGH   GOALS: Goals reviewed with patient? Yes  SHORT TERM GOALS: Target date: 06/13/22 Independent with initial HEP Baseline: Goal status: INITIAL  LONG TERM GOALS: Target date: 08/25/22  I with final HEP Baseline:  Goal status: INITIAL  2.  Increase BLE strength to at least 4/5 Baseline:  Goal status: INITIAL  3.  Patient will complete at least 8 reps during 30 sec STS test Baseline: 2 Goal status: INITIAL  4.  Patient will increase her gait speed as tested in 101M walk test to at least 1.39 m/sec Baseline: .428m/sec Goal status: INITIAL  5.  Patient will be able to perform all daily activities with  pain < 4/10 Baseline: 10+/10 Goal status: INITIAL  6.  Patient will be able to walk at least 500' on level and unlevel surfaces, with LRAD, with no C/O residual pain or stiffness. Baseline: Inconsistently walks longer distances, but impaired for the rest of the day. Goal status: INITIAL   PLAN:  PT FREQUENCY: 1-2x/week  PT DURATION: 12 weeks  PLANNED INTERVENTIONS: Therapeutic exercises, Therapeutic activity, Neuromuscular re-education, Balance training, Gait training, Patient/Family education, Self Care, Joint mobilization, Dry Needling, Electrical stimulation, Spinal mobilization, Cryotherapy, Moist heat, Taping, Ionotophoresis 4mg /ml Dexamethasone, and Manual therapy  PLAN FOR NEXT SESSION: Increase trunk mobility, initiate HEP, work on endurance    Ethel Rana DPT 06/17/22 2:33 PM

## 2022-06-17 NOTE — Therapy (Signed)
OUTPATIENT OCCUPATIONAL THERAPY NEURO EVALUATION  Patient Name: Desiree [name] MRN: [id number] DOB:[birth year], 47 y.o., female Today's Date: 06/17/2022  PCP: Dr. Jimmye Norman REFERRING PROVIDER: Dr. Moshe Cipro  END OF SESSION:  OT End of Session - 06/17/22 1418     Visit Number 3    Number of Visits 25    Date for OT Re-Evaluation 08/25/22    Authorization Type [employer] Medicare    Authorization - Visit Number 1    Progress Note Due on Visit 10    OT Start Time 1404    OT Stop Time 1440    OT Time Calculation (min) 36 min              Past Medical History:  Diagnosis Date   Anemia    Anxiety    panic attacks   Arthritis    bilateral knees   Childhood asthma    Complication of anesthesia    "sometimes it does not work; didn't during LEEP OR" (01/21/2016)   Depression    no med   ESRD (end stage renal disease) on dialysis    "TTS; [facility]" (08/03/2016)   GERD (gastroesophageal reflux disease)    nexium prn   Gout    History of blood transfusion    "related to kidneys; I've had 4" (01/21/2016)   Hypertension    Migraine    last one 01/18/19   Preterm labor ~ 2014   Retina hole, left    Seizures    "last one was in 2000; related to preeclampsia" (01/21/2016)   Past Surgical History:  Procedure Laterality Date   A/V FISTULAGRAM Left 12/19/2021   Procedure: A/V Fistulagram;  Surgeon: Angelia Mould, MD;  Location: [place] [facility];  Service: Cardiovascular;  Laterality: Left;   AV FISTULA PLACEMENT Left 2010   AV FISTULA PLACEMENT Left 01/20/2019   Procedure: ARTERIOVENOUS (AV) FISTULA CREATION LEFT UPPER ARM;  Surgeon: Angelia Mould, MD;  Location: [place];  Service: Vascular;  Laterality: Left;   AV FISTULA PLACEMENT Left 12/19/2021   Procedure: ARTERIOVENOUS (AV) FISTULA CREATION POSSIBLE TUNNELED DIALYSIS;  Surgeon: Waynetta Sandy, MD;  Location: [place];  Service: Vascular;  Laterality: Left;   CERVICAL BIOPSY  W/ LOOP  ELECTRODE EXCISION  2001   DILATION AND EVACUATION  08/02/2011   Procedure: DILATATION AND EVACUATION;  Surgeon: Logan Bores, MD;  Location: [place] [facility];  Service: Gynecology;;   DILATION AND EVACUATION N/A 08/31/2013   Procedure: DILATATION AND EVACUATION;  Surgeon: Woodroe Mode, MD;  Location: [place] [facility];  Service: Gynecology;  Laterality: N/A;   FISTULA SUPERFICIALIZATION Left 01/20/2019   Procedure: Fistula Superficialization;  Surgeon: Angelia Mould, MD;  Location: [place];  Service: Vascular;  Laterality: Left;   HYDRADENITIS EXCISION Right    INSERTION OF DIALYSIS CATHETER Right 12/19/2021   Procedure: INSERTION OF A TUNNELED DIALYSIS CATHETER;  Surgeon: Waynetta Sandy, MD;  Location: [place];  Service: Vascular;  Laterality: Right;   RENAL BIOPSY     REVISION OF ARTERIOVENOUS GORETEX GRAFT Left 02/11/2013   Procedure: REVISION OF ARTERIOVENOUS GORTEX FISTULA;  Surgeon: Rosetta Posner, MD;  Location: [facility];  Service: Vascular;  Laterality: Left;   THROMBECTOMY W/ EMBOLECTOMY Left 05/25/2018   Procedure: REPAIR OF BLEEDING ARTERIOVENOUS FISTULA;  Surgeon: Serafina Mitchell, MD;  Location: [facility] OR;  Service: Vascular;  Laterality: Left;   Patient Active Problem List   Diagnosis Date Noted   Asthma, persistent not controlled 04/30/2022  CAP (community acquired pneumonia) due to Pneumococcus 02/03/2022   Chronic respiratory failure with hypoxia 02/03/2022   Anxiety and depression 02/03/2022   Gout 02/03/2022   HTN (hypertension) 02/03/2022   Community acquired pneumonia 02/03/2022   Volume overload 12/21/2021   Skin infection 12/20/2021   Class 1 obesity 12/20/2021   CHF (congestive heart failure) 12/19/2021   Hypertensive urgency 12/05/2021   Prolonged QT interval 12/05/2021   Anxiety    Acute gastroenteritis 02/16/2021   GERD (gastroesophageal reflux disease) 01/11/2021   Hyponatremia 01/11/2021   Pulmonary edema 01/11/2021   ESRD (end stage renal disease) on dialysis  06/02/2018   Fever 12/07/2017   Abdominal pain 08/03/2016   Chronic pain 01/21/2016   Muscle cramps 03/29/2015   Anemia of chronic kidney failure 09/15/2013   Hyperkalemia 08/29/2013   Cocaine abuse 08/26/2013   Depression 04/21/2012   ESRD (end stage renal disease) 08/03/2011    ONSET DATE: 05/21/22  REFERRING DIAG: weakness, FTT in dialysis pctn   THERAPY DIAG:  Muscle weakness (generalized)  Unsteadiness on feet  Difficulty in walking, not elsewhere classified  Other abnormalities of gait and mobility  Chronic right shoulder pain  Rationale for Evaluation and Treatment: Rehabilitation  SUBJECTIVE:   SUBJECTIVE STATEMENT: Pt reports  continued generalized pain Pt accompanied by: self  PERTINENT HISTORY: PMH - ESRD on HD T/TH/Sat since 2010, nephrotic syndrome, anxiety and depression, GERD, hypertension  chronic respiratory failure 3 L oxygen,Cocaine and THC abuse Pt. was hospitalized 02/03/2022 for increasing dyspnea and oxygen needs and treated for community-acquired pneumonia and improved after dialysis.    PRECAUTIONS: Other: dialysis T/TH/Sat , 3 L O2, fall risk  WEIGHT BEARING RESTRICTIONS: no  PAIN:  Are you having pain? Yes: NPRS scale: 8/10 Pain location: shoulders and LE's Pain description: aching Aggravating factors: movement Relieving factors: rest, sleep  FALLS: Has patient fallen in last 6 months? No  LIVING ENVIRONMENT: Lives with: lives with their family and lives with their spouse Lives in: House/apartment Stairs: No Has following equipment at home: None  PLOF: Independent prior to October  PATIENT GOALS: improve use of arms and decrease pain  OBJECTIVE:   HAND DOMINANCE: Right  ADLs: Overall ADLs: increased time required Transfers/ambulation related to ADLs: Eating: pt is able to feed herself but she is not hungry Grooming: mod I UB Dressing: mod I shirt , needs help with jacket(mod A) LB Dressing: max-dependent with LB  dressing Toileting: performs mod I with difficulty Bathing: supervision Tub Shower transfers:min A bathtub shower Equipment: none Therapist called and spoke with SW at [facility] and requested order for 3 in 1 commode for patient. SW will request from MD and Patient to discuss with her on Thursday.  IADLs: Shopping: dependent Light housekeeping: dependent Meal Prep: dependent Community mobility: supervision Medication management: Pt handles   MOBILITY STATUS: independent with increased time required    ACTIVITY TOLERANCE: Activity tolerance: Pt reports that she tolerateds 5 mins or less standing for functional activity due to pain.  FUNCTIONAL OUTCOME MEASURES: Quick Dash: 85% disability  UPPER EXTREMITY ROM:  Pt demonstrates decreased A/ROM R thumb flexion, she reports catching in car door  Active ROM Right eval Left eval  Shoulder flexion 80 70  Shoulder abduction 80 65  Shoulder adduction    Shoulder extension    Shoulder internal rotation    Shoulder external rotation    Elbow flexion WFL 110  Elbow extension -65 -40  Wrist flexion 50% WFL  Wrist extension 50%  WFL  Wrist ulnar deviation    Wrist radial deviation    Wrist pronation    Wrist supination    (Blank rows = not tested)  HAND FUNCTION: Grip strength: Right: 8 lbs; Left: 14 lbs  COORDINATION:NT   SENSATION: Not tested  EDEMA: Pt reports edema in bilateral legs    COGNITION: Overall cognitive status: Within functional limits for tasks assessed   VISION ASSESSMENT: Not tested- 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   TODAY'S TREATMENT 06/17/22- Arm bike x 5 mins level 1 for conditioning Pt transitioned seated to supine on wedge  Supine closed chain shoulder flexion 2 sets 5 reps, chest press 2 sets 5 reps , shoulder abduction 10 reps each side, biceps curls 2 sets of 10 reps, min v.c Yellow  putty HEP for sustained grip and pinch, min v.c and demonstration. Gripper set at level 1 to pick up 1 inch blocks for sustained grip with RUE and LUE, pt was only able to pick up 5 blocks consecutively at a time then she switched back to right.   06/15/22- Pt transitioned seated to supine on wedge with min A for LE's Supine closed chain shoulder flexion 2 sets 5 reps, chest press 2 sets 5 reps , shoulder abduction 10 reps each side, biceps curls 2 sets of 10 reps, min v.c Pt practiced donning/ doffing socks with reacher and sockaide following instruction by OT. Pt returned demonstration of use. Pt requested to end therapy early due to fatigue. Pt reports purchasing tub bench and 3 in 1 commode for use at home. O2 sats 100%, HR 80 BPM  Education details: supine HEP with cane   Person educated: Patient  Education: initial HEP, use of sockaide and Chartered certified accountant method: Explanation, demonstration, handout, v.c. Education comprehension: verbalized understanding, returned demonstration  HOME EXERCISE PROGRAM: N/a   GOALS: Potential Goals reviewed with patient? Yes  SHORT TERM GOALS: Target date: 07/02/22  I with initial HEP Baseline:dependent Goal status: INITIAL  2.  I with AE strategies for ADLS and DME recommendations (ie: 3 in -1 and tub bench) Baseline: dependent Goal status: INITIAL  3.  Pt will increase bilateral shoulder flexion by 5* for increased functional reach Baseline: RUE 80*, LUE 70* Goal status: INITIAL  4.  Pt will perform LB dressing with mod A using AE prn. Baseline: max-dependent Goal status: INITIAL  5.  Pt will perform functional activity in standing x 8 mins prior to rest break. Baseline: stands 5 mins or less prior to rest break. Goal status: INITIAL  6.  Pt will report bilateral UE pain is no greater than 6/10 for ADLS. Baseline: 10/10 pain Goal status: INITIAL  LONG TERM GOALS: Target date: 08/25/22  I with updated HEP Baseline:  dependent Goal status: INITIAL  2.  Pt will donn jacket mod I Baseline: mod A Goal status: INITIAL  3.  Pt. Will perform LB dressing min A with AE prn. Baseline: max- dependent Goal status: INITIAL  4.  Pt will increase bilateral grip strength by 8 lbs for increased functional use. Baseline: RUE 8 lbs, LUE 14 lbs. Goal status: INITIAL  5.  Pt will perform functional activity in standing x 12 mins prior to rest break. Baseline: stands for 5 mins or less prior to rest break Goal status: INITIAL  6.  Pt will perform light home management tasks modified independently Baseline: dependent Goal status: INITIAL  ASSESSMENT:  CLINICAL IMPRESSION: Pt is progressing towards goals. She is demonstrates imroving strength and endurance. PERFORMANCE DEFICITS: in functional skills including ,  and psychosocial skills including coping strategies, environmental adaptation, habits, interpersonal interactions, and routines and behaviors.   IMPAIRMENTS: are limiting patient from ADLs, IADLs, rest and sleep, play, leisure, and social participation.   CO-MORBIDITIES: may have co-morbidities  that affects occupational performance. Patient will benefit from skilled OT to address above impairments and improve overall function.  MODIFICATION OR ASSISTANCE TO COMPLETE EVALUATION: Min-Moderate modification of tasks or assist with assess necessary to complete an evaluation.  OT OCCUPATIONAL PROFILE AND HISTORY: Detailed assessment: Review of records and additional review of physical, cognitive, psychosocial history related to current functional performance.  CLINICAL DECISION MAKING: Moderate - several treatment options, min-mod task modification necessary  REHAB POTENTIAL: Good  EVALUATION COMPLEXITY: Moderate    PLAN:  OT FREQUENCY: 2x/week  OT DURATION: 12 weeksplus eval  PLANNED INTERVENTIONS: self care/ADL training, therapeutic exercise, therapeutic activity, neuromuscular re-education,  manual therapy, passive range of motion, balance training, functional mobility training, ultrasound, paraffin, moist heat, cryotherapy, contrast [name], patient/family education, energy conservation, coping strategies training, DME and/or AE instructions, and Re-evaluation  RECOMMENDED OTHER SERVICES: PT  CONSULTED AND AGREED WITH PLAN OF CARE: Patient  PLAN FOR NEXT SESSION: activity tolerance, continue ADL strategies. Gustavo Dispenza, OT 06/17/2022, 2:54 PM

## 2022-06-22 ENCOUNTER — Ambulatory Visit: Payer: 59 | Admitting: Physical Therapy

## 2022-06-22 ENCOUNTER — Ambulatory Visit: Payer: 59 | Admitting: Occupational Therapy

## 2022-06-24 ENCOUNTER — Ambulatory Visit: Payer: 59 | Admitting: Physical Therapy

## 2022-06-24 ENCOUNTER — Ambulatory Visit: Payer: 59 | Admitting: Occupational Therapy

## 2022-06-28 ENCOUNTER — Encounter (HOSPITAL_COMMUNITY): Payer: Self-pay | Admitting: Obstetrics & Gynecology

## 2022-06-28 ENCOUNTER — Inpatient Hospital Stay (HOSPITAL_COMMUNITY)
Admission: AD | Admit: 2022-06-28 | Discharge: 2022-06-28 | Disposition: A | Discharge status: Home / Self Care | Payer: 59 | Attending: Obstetrics & Gynecology | Admitting: Obstetrics & Gynecology

## 2022-06-28 DIAGNOSIS — N912 Amenorrhea, unspecified: Secondary | ICD-10-CM | POA: Insufficient documentation

## 2022-06-28 DIAGNOSIS — Z3202 Encounter for pregnancy test, result negative: Secondary | ICD-10-CM | POA: Insufficient documentation

## 2022-06-28 DIAGNOSIS — Z992 Dependence on renal dialysis: Secondary | ICD-10-CM | POA: Diagnosis not present

## 2022-06-28 DIAGNOSIS — N186 End stage renal disease: Secondary | ICD-10-CM | POA: Insufficient documentation

## 2022-06-28 DIAGNOSIS — I120 Hypertensive chronic kidney disease with stage 5 chronic kidney disease or end stage renal disease: Secondary | ICD-10-CM | POA: Insufficient documentation

## 2022-06-28 LAB — HCG, QUANTITATIVE, PREGNANCY: hCG, Beta Chain, Quant, S: 11 m[IU]/mL — ABNORMAL HIGH (ref <5)

## 2022-06-28 NOTE — MAU Note (Signed)
Desiree [name] is a 47 y.o. at Unknown here in MAU reporting: sent her by renal doc.  Pt reports unable to urinate, is dialysis pt.   They (the renal docs )had done blood work and said she was pregnant. I-stat Beta hCG on 11/21 was 7.4. LMP: Oct 2023

## 2022-06-28 NOTE — MAU Provider Note (Signed)
History     CSN: [id number]  Arrival date and time: 06/28/22 1206   47 y.o. [id number] female with hx of end stage renal disease on dialysis presenting for answers. Was told by her kidney doctor that her pregnancy test was positive and she should follow up with a gynecologist. Reports no menstrual cycle since September or October. Reports several month hx of abdominal pain and back pain.    OB History     Gravida  6   Para  1   Term  1   Preterm  0   AB  5   Living  1      SAB  2   IAB  2   Ectopic  0   Multiple  0   Live Births  1           Past Medical History:  Diagnosis Date   Anemia    Anxiety    panic attacks   Arthritis    bilateral knees   Childhood asthma    Complication of anesthesia    "sometimes it does not work; didn't during LEEP OR" (01/21/2016)   Depression    no med   ESRD (end stage renal disease) on dialysis    "TTS; [facility]; [facility]" (08/03/2016)   GERD (gastroesophageal reflux disease)    nexium prn   Gout    History of blood transfusion    "related to kidneys; I've had 4" (01/21/2016)   Hypertension    Migraine    last one 01/18/19   Preterm labor ~ 2014   Retina hole, left    Seizures    "last one was in 2000; related to preeclampsia" (01/21/2016)    Past Surgical History:  Procedure Laterality Date   A/V FISTULAGRAM Left 12/19/2021   Procedure: A/V Fistulagram;  Surgeon: Chuck Hint, MD;  Location: [facility] INVASIVE CV LAB;  Service: Cardiovascular;  Laterality: Left;   AV FISTULA PLACEMENT Left 2010   AV FISTULA PLACEMENT Left 01/20/2019   Procedure: ARTERIOVENOUS (AV) FISTULA CREATION LEFT UPPER ARM;  Surgeon: Chuck Hint, MD;  Location: [facility] OR;  Service: Vascular;  Laterality: Left;   AV FISTULA PLACEMENT Left 12/19/2021   Procedure: ARTERIOVENOUS (AV) FISTULA CREATION POSSIBLE TUNNELED DIALYSIS;  Surgeon: Maeola Harman, MD;  Location: [facility] OR;  Service: Vascular;  Laterality: Left;    CERVICAL BIOPSY  W/ LOOP ELECTRODE EXCISION  2001   DILATION AND EVACUATION  08/02/2011   Procedure: DILATATION AND EVACUATION;  Surgeon: Oliver Pila, MD;  Location: [facility] ORS;  Service: Gynecology;;   DILATION AND EVACUATION N/A 08/31/2013   Procedure: DILATATION AND EVACUATION;  Surgeon: Adam Phenix, MD;  Location: [facility] ORS;  Service: Gynecology;  Laterality: N/A;   FISTULA SUPERFICIALIZATION Left 01/20/2019   Procedure: Fistula Superficialization;  Surgeon: Chuck Hint, MD;  Location: [facility] OR;  Service: Vascular;  Laterality: Left;   HYDRADENITIS EXCISION Right    INSERTION OF DIALYSIS CATHETER Right 12/19/2021   Procedure: INSERTION OF A TUNNELED DIALYSIS CATHETER;  Surgeon: Maeola Harman, MD;  Location: [facility] OR;  Service: Vascular;  Laterality: Right;   RENAL BIOPSY     REVISION OF ARTERIOVENOUS GORETEX GRAFT Left 02/11/2013   Procedure: REVISION OF ARTERIOVENOUS GORTEX FISTULA;  Surgeon: Larina Earthly, MD;  Location: [facility] OR;  Service: Vascular;  Laterality: Left;   THROMBECTOMY W/ EMBOLECTOMY Left 05/25/2018   Procedure: REPAIR OF BLEEDING ARTERIOVENOUS FISTULA;  Surgeon: Nada Libman, MD;  Location: [facility] OR;  Service: Vascular;  Laterality: Left;    Family History  Problem Relation Age of Onset   Diabetes Mother    Hyperlipidemia Mother    Hypertension Mother    Heart disease Mother    Hypertension Other    Diabetes type II Other     Social History   Tobacco Use   Smoking status: Never    Passive exposure: Yes   Smokeless tobacco: Never   Tobacco comments:    Husband and daughter Smokes on patio  Vaping Use   Vaping Use: Never used  Substance Use Topics   Alcohol use: No   Drug use: Yes    Types: Marijuana    Comment: not recent    Allergies:  Allergies  Allergen Reactions   Morphine Shortness Of Breath and Anaphylaxis   Prednisone Other (See Comments)    Other reaction(s): Other (See Comments) Muscle spasms Patient says prednisone causes  her to cramp all over, muscle spasms uncontrolled   Tuna [Fish Allergy] Itching, Swelling, Rash and Other (See Comments)    Face droops also   Amlodipine Other (See Comments)    Angioedema (09/05/17 ED visit)   Iodinated Contrast Media Itching   Tape Itching    Adhesive tape   paper tape ok    Medications Prior to Admission  Medication Sig Dispense Refill Last Dose   albuterol (PROVENTIL HFA;VENTOLIN HFA) 108 (90 Base) MCG/ACT inhaler Inhale 2 puffs into the lungs every 4 (four) hours as needed for wheezing or shortness of breath. (Patient not taking: Reported on 04/30/2022) 1 Inhaler 2    ALPRAZolam (XANAX) 1 MG tablet Take 1 mg by mouth 3 (three) times daily as needed.      amoxicillin-clavulanate (AUGMENTIN) 875-125 MG tablet Take 1 tablet by mouth 2 (two) times daily. 14 tablet 0    aspirin EC 81 MG tablet Take 81 mg by mouth daily. Swallow whole.      calcium acetate (PHOSLO) 667 MG capsule Take 667-1,334 mg by mouth See admin instructions. 3 capsules with meals  1 to 2 capsules with snacks  4    cinacalcet (SENSIPAR) 60 MG tablet Take 60 mg by mouth every evening.      cyclobenzaprine (FLEXERIL) 10 MG tablet Take 10 mg by mouth 2 (two) times daily as needed for muscle spasms.      diphenhydrAMINE (BENADRYL) 25 MG tablet Take 1 tablet (25 mg total) by mouth every 6 (six) hours. (Patient not taking: Reported on 04/30/2022) 20 tablet 0    fluticasone furoate-vilanterol (BREO ELLIPTA) 100-25 MCG/ACT AEPB Inhale 1 puff into the lungs daily. 60 each 2    lactulose (CHRONULAC) 10 GM/15ML solution Take 30 mLs by mouth daily as needed for mild constipation, moderate constipation or severe constipation. (Patient not taking: Reported on 04/30/2022)      NARCAN 4 MG/0.1ML LIQD nasal spray kit Place 1 spray into the nose as needed (accidental overdose). (Patient not taking: Reported on 04/30/2022)      nystatin-triamcinolone (MYCOLOG II) cream Apply topically 2 (two) times daily. (Patient not taking:  Reported on 04/30/2022)      ondansetron (ZOFRAN-ODT) 8 MG disintegrating tablet Take 8 mg by mouth every 8 (eight) hours as needed for nausea or vomiting.       Oxycodone HCl 10 MG TABS Take 1 tablet (10 mg total) by mouth 3 (three) times daily as needed (severe pain). 5 tablet 0    pantoprazole (PROTONIX) 20 MG tablet Take 20 mg by mouth daily.  sevelamer carbonate (RENVELA) 800 MG tablet Take 1,600 mg by mouth 3 (three) times daily.       Review of Systems  Gastrointestinal:  Positive for abdominal pain.  Genitourinary:  Negative for vaginal bleeding.  Musculoskeletal:  Positive for back pain.   Physical Exam   Blood pressure 133/80.  Physical Exam Vitals and nursing note reviewed.  Constitutional:      General: She is not in acute distress.    Appearance: Normal appearance.  HENT:     Head: Normocephalic and atraumatic.  Pulmonary:     Effort: Pulmonary effort is normal. No respiratory distress.  Abdominal:     General: There is no distension.     Palpations: Abdomen is soft. There is no mass.     Tenderness: There is no abdominal tenderness. There is no guarding or rebound.     Hernia: No hernia is present.  Musculoskeletal:        General: Normal range of motion.     Cervical back: Normal range of motion.  Skin:    General: Skin is warm and dry.  Neurological:     General: No focal deficit present.     Mental Status: She is alert and oriented to person, place, and time.  Psychiatric:        Mood and Affect: Mood normal.        Behavior: Behavior normal.    Results for orders placed or performed during the hospital encounter of 06/28/22 (from the past 24 hour(s))  hCG, quantitative, pregnancy     Status: Abnormal   Collection Time: 06/28/22 12:36 PM  Result Value Ref Range   hCG, Beta Chain, Quant, S 11 (H) <5 mIU/mL   MAU Course  Procedures  MDM Review of records: pt had I-stat Beta on 02/03/22 of 7.4.  No signs of acute abdominal process. Consult with  Dr. Macon Large. Discussed with pt false positive qhcg can occur in patients with ESRD and theres no signs of pregnancy. Discussed menstrual changes during perimenopausal period. Pt states she has not been sexually active since June and currently has a female partner.  She is stable for discharge.  Assessment and Plan   1. Amenorrhea    Discharge home Follow up with PCP as planned Follow up at [facility] for menopause eval -message sent to  Return to MAU for pregnancy emergencies  Allergies as of 06/28/2022       Reactions   Morphine Shortness Of Breath, Anaphylaxis   Prednisone Other (See Comments)   Other reaction(s): Other (See Comments) Muscle spasms Patient says prednisone causes her to cramp all over, muscle spasms uncontrolled   Tuna [fish Allergy] Itching, Swelling, Rash, Other (See Comments)   Face droops also   Amlodipine Other (See Comments)   Angioedema (09/05/17 ED visit)   Iodinated Contrast Media Itching   Tape Itching   Adhesive tape   paper tape ok        Medication List     STOP taking these medications    diphenhydrAMINE 25 MG tablet Commonly known as: BENADRYL   nystatin-triamcinolone cream Commonly known as: MYCOLOG II       TAKE these medications    albuterol 108 (90 Base) MCG/ACT inhaler Commonly known as: VENTOLIN HFA Inhale 2 puffs into the lungs every 4 (four) hours as needed for wheezing or shortness of breath.   ALPRAZolam 1 MG tablet Commonly known as: XANAX Take 1 mg by mouth 3 (three) times daily as needed.  amoxicillin-clavulanate 875-125 MG tablet Commonly known as: AUGMENTIN Take 1 tablet by mouth 2 (two) times daily.   aspirin EC 81 MG tablet Take 81 mg by mouth daily. Swallow whole.   calcium acetate 667 MG capsule Commonly known as: PHOSLO Take 667-1,334 mg by mouth See admin instructions. 3 capsules with meals  1 to 2 capsules with snacks   cinacalcet 60 MG tablet Commonly known as: SENSIPAR Take 60 mg by mouth every  evening.   cyclobenzaprine 10 MG tablet Commonly known as: FLEXERIL Take 10 mg by mouth 2 (two) times daily as needed for muscle spasms.   fluticasone furoate-vilanterol 100-25 MCG/ACT Aepb Commonly known as: Breo Ellipta Inhale 1 puff into the lungs daily.   lactulose 10 GM/15ML solution Commonly known as: CHRONULAC Take 30 mLs by mouth daily as needed for mild constipation, moderate constipation or severe constipation.   Narcan 4 MG/0.1ML Liqd nasal spray kit Generic drug: naloxone Place 1 spray into the nose as needed (accidental overdose).   ondansetron 8 MG disintegrating tablet Commonly known as: ZOFRAN-ODT Take 8 mg by mouth every 8 (eight) hours as needed for nausea or vomiting.   Oxycodone HCl 10 MG Tabs Take 1 tablet (10 mg total) by mouth 3 (three) times daily as needed (severe pain).   pantoprazole 20 MG tablet Commonly known as: PROTONIX Take 20 mg by mouth daily.   sevelamer carbonate 800 MG tablet Commonly known as: RENVELA Take 1,600 mg by mouth 3 (three) times daily.        Donette Larry, CNM 06/28/2022, 2:33 PM

## 2022-06-29 ENCOUNTER — Ambulatory Visit: Payer: 59 | Admitting: Physical Therapy

## 2022-06-29 ENCOUNTER — Ambulatory Visit: Payer: 59 | Admitting: Occupational Therapy

## 2022-07-01 ENCOUNTER — Ambulatory Visit: Payer: 59 | Admitting: Physical Therapy

## 2022-07-01 ENCOUNTER — Ambulatory Visit: Payer: 59 | Admitting: Occupational Therapy

## 2022-07-06 ENCOUNTER — Ambulatory Visit: Payer: 59 | Admitting: Physical Therapy

## 2022-07-06 ENCOUNTER — Ambulatory Visit: Payer: 59 | Admitting: Occupational Therapy

## 2022-07-08 ENCOUNTER — Ambulatory Visit: Payer: 59 | Admitting: Occupational Therapy

## 2022-07-08 ENCOUNTER — Ambulatory Visit: Payer: 59 | Admitting: Physical Therapy

## 2022-07-13 ENCOUNTER — Ambulatory Visit: Payer: 59 | Admitting: Occupational Therapy

## 2022-07-13 ENCOUNTER — Ambulatory Visit: Payer: 59 | Admitting: Physical Therapy

## 2022-07-15 ENCOUNTER — Ambulatory Visit: Payer: 59 | Admitting: Physical Therapy

## 2022-07-15 ENCOUNTER — Ambulatory Visit: Payer: 59 | Admitting: Occupational Therapy

## 2022-08-15 DIAGNOSIS — I219 Acute myocardial infarction, unspecified: Secondary | ICD-10-CM

## 2022-08-15 HISTORY — DX: Acute myocardial infarction, unspecified: I21.9

## 2022-08-17 ENCOUNTER — Encounter: Payer: 59 | Admitting: Obstetrics and Gynecology

## 2022-09-12 ENCOUNTER — Other Ambulatory Visit: Payer: Self-pay

## 2022-09-12 ENCOUNTER — Encounter (HOSPITAL_COMMUNITY): Payer: Self-pay

## 2022-09-12 ENCOUNTER — Inpatient Hospital Stay (HOSPITAL_COMMUNITY)
Admission: EM | Admit: 2022-09-12 | Discharge: 2022-09-17 | DRG: 250 | Disposition: A | Discharge status: Home Health | Payer: 59 | Attending: Interventional Cardiology | Admitting: Interventional Cardiology

## 2022-09-12 ENCOUNTER — Emergency Department (HOSPITAL_COMMUNITY): Payer: 59

## 2022-09-12 ENCOUNTER — Encounter (HOSPITAL_COMMUNITY)
Admission: EM | Disposition: A | Discharge status: Home Health | Payer: Self-pay | Source: Home / Self Care | Attending: Interventional Cardiology

## 2022-09-12 DIAGNOSIS — Z8249 Family history of ischemic heart disease and other diseases of the circulatory system: Secondary | ICD-10-CM

## 2022-09-12 DIAGNOSIS — I2510 Atherosclerotic heart disease of native coronary artery without angina pectoris: Secondary | ICD-10-CM | POA: Diagnosis present

## 2022-09-12 DIAGNOSIS — M170 Bilateral primary osteoarthritis of knee: Secondary | ICD-10-CM | POA: Diagnosis present

## 2022-09-12 DIAGNOSIS — I058 Other rheumatic mitral valve diseases: Secondary | ICD-10-CM | POA: Insufficient documentation

## 2022-09-12 DIAGNOSIS — I5021 Acute systolic (congestive) heart failure: Secondary | ICD-10-CM | POA: Diagnosis not present

## 2022-09-12 DIAGNOSIS — I132 Hypertensive heart and chronic kidney disease with heart failure and with stage 5 chronic kidney disease, or end stage renal disease: Secondary | ICD-10-CM | POA: Diagnosis present

## 2022-09-12 DIAGNOSIS — J9621 Acute and chronic respiratory failure with hypoxia: Secondary | ICD-10-CM | POA: Diagnosis not present

## 2022-09-12 DIAGNOSIS — I3139 Other pericardial effusion (noninflammatory): Secondary | ICD-10-CM | POA: Diagnosis present

## 2022-09-12 DIAGNOSIS — N186 End stage renal disease: Secondary | ICD-10-CM | POA: Diagnosis present

## 2022-09-12 DIAGNOSIS — Z79899 Other long term (current) drug therapy: Secondary | ICD-10-CM

## 2022-09-12 DIAGNOSIS — M109 Gout, unspecified: Secondary | ICD-10-CM | POA: Diagnosis present

## 2022-09-12 DIAGNOSIS — K219 Gastro-esophageal reflux disease without esophagitis: Secondary | ICD-10-CM | POA: Diagnosis present

## 2022-09-12 DIAGNOSIS — Z992 Dependence on renal dialysis: Secondary | ICD-10-CM

## 2022-09-12 DIAGNOSIS — M898X9 Other specified disorders of bone, unspecified site: Secondary | ICD-10-CM | POA: Diagnosis present

## 2022-09-12 DIAGNOSIS — Z885 Allergy status to narcotic agent status: Secondary | ICD-10-CM

## 2022-09-12 DIAGNOSIS — R079 Chest pain, unspecified: Secondary | ICD-10-CM | POA: Diagnosis not present

## 2022-09-12 DIAGNOSIS — Z9981 Dependence on supplemental oxygen: Secondary | ICD-10-CM

## 2022-09-12 DIAGNOSIS — I255 Ischemic cardiomyopathy: Secondary | ICD-10-CM | POA: Diagnosis present

## 2022-09-12 DIAGNOSIS — I272 Pulmonary hypertension, unspecified: Secondary | ICD-10-CM | POA: Diagnosis present

## 2022-09-12 DIAGNOSIS — D631 Anemia in chronic kidney disease: Secondary | ICD-10-CM | POA: Diagnosis present

## 2022-09-12 DIAGNOSIS — Z888 Allergy status to other drugs, medicaments and biological substances status: Secondary | ICD-10-CM

## 2022-09-12 DIAGNOSIS — I480 Paroxysmal atrial fibrillation: Secondary | ICD-10-CM

## 2022-09-12 DIAGNOSIS — I2109 ST elevation (STEMI) myocardial infarction involving other coronary artery of anterior wall: Secondary | ICD-10-CM | POA: Diagnosis not present

## 2022-09-12 DIAGNOSIS — Z7982 Long term (current) use of aspirin: Secondary | ICD-10-CM

## 2022-09-12 DIAGNOSIS — G8929 Other chronic pain: Secondary | ICD-10-CM | POA: Diagnosis present

## 2022-09-12 DIAGNOSIS — F419 Anxiety disorder, unspecified: Secondary | ICD-10-CM | POA: Diagnosis present

## 2022-09-12 DIAGNOSIS — I4819 Other persistent atrial fibrillation: Secondary | ICD-10-CM | POA: Diagnosis present

## 2022-09-12 DIAGNOSIS — I071 Rheumatic tricuspid insufficiency: Secondary | ICD-10-CM | POA: Insufficient documentation

## 2022-09-12 DIAGNOSIS — I213 ST elevation (STEMI) myocardial infarction of unspecified site: Principal | ICD-10-CM

## 2022-09-12 DIAGNOSIS — G43909 Migraine, unspecified, not intractable, without status migrainosus: Secondary | ICD-10-CM | POA: Diagnosis present

## 2022-09-12 DIAGNOSIS — Z91013 Allergy to seafood: Secondary | ICD-10-CM

## 2022-09-12 DIAGNOSIS — Z91041 Radiographic dye allergy status: Secondary | ICD-10-CM

## 2022-09-12 DIAGNOSIS — I493 Ventricular premature depolarization: Secondary | ICD-10-CM | POA: Diagnosis not present

## 2022-09-12 DIAGNOSIS — I081 Rheumatic disorders of both mitral and tricuspid valves: Secondary | ICD-10-CM | POA: Diagnosis present

## 2022-09-12 DIAGNOSIS — N2581 Secondary hyperparathyroidism of renal origin: Secondary | ICD-10-CM | POA: Diagnosis present

## 2022-09-12 DIAGNOSIS — J9611 Chronic respiratory failure with hypoxia: Secondary | ICD-10-CM | POA: Diagnosis present

## 2022-09-12 DIAGNOSIS — I10 Essential (primary) hypertension: Secondary | ICD-10-CM | POA: Diagnosis present

## 2022-09-12 DIAGNOSIS — I340 Nonrheumatic mitral (valve) insufficiency: Secondary | ICD-10-CM | POA: Insufficient documentation

## 2022-09-12 DIAGNOSIS — I4891 Unspecified atrial fibrillation: Secondary | ICD-10-CM

## 2022-09-12 DIAGNOSIS — I5023 Acute on chronic systolic (congestive) heart failure: Secondary | ICD-10-CM | POA: Insufficient documentation

## 2022-09-12 HISTORY — PX: LEFT HEART CATH AND CORONARY ANGIOGRAPHY: CATH118249

## 2022-09-12 HISTORY — PX: CORONARY/GRAFT ACUTE MI REVASCULARIZATION: CATH118305

## 2022-09-12 LAB — CBC WITH DIFFERENTIAL/PLATELET
Abs Immature Granulocytes: 0.01 10*3/uL (ref 0.00–0.07)
Basophils Absolute: 0 10*3/uL (ref 0.0–0.1)
Basophils Relative: 0 %
Eosinophils Absolute: 0.3 10*3/uL (ref 0.0–0.5)
Eosinophils Relative: 5 %
HCT: 40 % (ref 36.0–46.0)
Hemoglobin: 12.2 g/dL (ref 12.0–15.0)
Immature Granulocytes: 0 %
Lymphocytes Relative: 27 %
Lymphs Abs: 1.5 10*3/uL (ref 0.7–4.0)
MCH: 26.2 pg (ref 26.0–34.0)
MCHC: 30.5 g/dL (ref 30.0–36.0)
MCV: 85.8 fL (ref 80.0–100.0)
Monocytes Absolute: 0.7 10*3/uL (ref 0.1–1.0)
Monocytes Relative: 14 %
Neutro Abs: 2.8 10*3/uL (ref 1.7–7.7)
Neutrophils Relative %: 54 %
Platelets: 145 10*3/uL — ABNORMAL LOW (ref 150–400)
RBC: 4.66 MIL/uL (ref 3.87–5.11)
RDW: 18.3 % — ABNORMAL HIGH (ref 11.5–15.5)
WBC: 5.4 10*3/uL (ref 4.0–10.5)
nRBC: 0 % (ref 0.0–0.2)

## 2022-09-12 LAB — I-STAT CHEM 8, ED
BUN: 38 mg/dL — ABNORMAL HIGH (ref 6–20)
Calcium, Ion: 0.94 mmol/L — ABNORMAL LOW (ref 1.15–1.40)
Chloride: 99 mmol/L (ref 98–111)
Creatinine, Ser: 6.2 mg/dL — ABNORMAL HIGH (ref 0.44–1.00)
Glucose, Bld: 102 mg/dL — ABNORMAL HIGH (ref 70–99)
HCT: 42 % (ref 36.0–46.0)
Hemoglobin: 14.3 g/dL (ref 12.0–15.0)
Potassium: 4.1 mmol/L (ref 3.5–5.1)
Sodium: 138 mmol/L (ref 135–145)
TCO2: 30 mmol/L (ref 22–32)

## 2022-09-12 LAB — PROTIME-INR
INR: 1.2 (ref 0.8–1.2)
Prothrombin Time: 15.1 seconds (ref 11.4–15.2)

## 2022-09-12 LAB — TYPE AND SCREEN

## 2022-09-12 SURGERY — CORONARY/GRAFT ACUTE MI REVASCULARIZATION
Anesthesia: LOCAL

## 2022-09-12 MED ORDER — LIDOCAINE HCL (PF) 1 % IJ SOLN
INTRAMUSCULAR | Status: AC
  Filled 2022-09-12: qty 30

## 2022-09-12 MED ORDER — METHYLPREDNISOLONE SODIUM SUCC 40 MG IJ SOLR
40.0000 mg | Freq: Once | INTRAMUSCULAR | Status: AC
  Administered 2022-09-12: 40 mg via INTRAVENOUS
  Filled 2022-09-12: qty 1

## 2022-09-12 MED ORDER — DIPHENHYDRAMINE HCL 50 MG/ML IJ SOLN
INTRAMUSCULAR | Status: DC | PRN
  Administered 2022-09-12: 25 mg via INTRAVENOUS

## 2022-09-12 MED ORDER — HEPARIN (PORCINE) IN NACL 1000-0.9 UT/500ML-% IV SOLN
INTRAVENOUS | Status: DC | PRN
  Administered 2022-09-12 – 2022-09-13 (×2): 500 mL

## 2022-09-12 MED ORDER — HEPARIN SODIUM (PORCINE) 5000 UNIT/ML IJ SOLN
4000.0000 [IU] | Freq: Once | INTRAMUSCULAR | Status: AC
  Administered 2022-09-12: 4000 [IU] via INTRAVENOUS
  Filled 2022-09-12: qty 1

## 2022-09-12 MED ORDER — NITROGLYCERIN 0.4 MG SL SUBL
0.4000 mg | SUBLINGUAL_TABLET | SUBLINGUAL | Status: DC | PRN
  Administered 2022-09-12 (×2): 0.4 mg via SUBLINGUAL
  Filled 2022-09-12: qty 1

## 2022-09-12 MED ORDER — METHYLPREDNISOLONE SODIUM SUCC 40 MG IJ SOLR
INTRAMUSCULAR | Status: AC
  Filled 2022-09-12: qty 2

## 2022-09-12 MED ORDER — METHYLPREDNISOLONE SODIUM SUCC 125 MG IJ SOLR
80.0000 mg | Freq: Once | INTRAMUSCULAR | Status: AC
  Administered 2022-09-12: 80 mg via INTRAVENOUS

## 2022-09-12 MED ORDER — FENTANYL CITRATE PF 50 MCG/ML IJ SOSY
50.0000 ug | PREFILLED_SYRINGE | Freq: Once | INTRAMUSCULAR | Status: AC
  Administered 2022-09-12: 50 ug via INTRAVENOUS
  Filled 2022-09-12: qty 1

## 2022-09-12 MED ORDER — DIPHENHYDRAMINE HCL 50 MG/ML IJ SOLN
INTRAMUSCULAR | Status: AC
  Filled 2022-09-12: qty 1

## 2022-09-12 MED ORDER — VERAPAMIL HCL 2.5 MG/ML IV SOLN
INTRAVENOUS | Status: AC
  Filled 2022-09-12: qty 2

## 2022-09-12 SURGICAL SUPPLY — 19 items
BALLN EMERGE MR 2.0X12 (BALLOONS) ×1
BALLOON EMERGE MR 2.0X12 (BALLOONS) IMPLANT
CATH INFINITI 5 FR JL3.5 (CATHETERS) IMPLANT
CATH INFINITI JR4 5F (CATHETERS) IMPLANT
CATH LAUNCHER 6FR EBU3.5 (CATHETERS) IMPLANT
GLIDESHEATH SLEND SS 6F .021 (SHEATH) IMPLANT
GUIDEWIRE INQWIRE 1.5J.035X260 (WIRE) IMPLANT
INQWIRE 1.5J .035X260CM (WIRE)
KIT ENCORE 26 ADVANTAGE (KITS) IMPLANT
KIT HEART LEFT (KITS) ×1 IMPLANT
KIT HEMO VALVE WATCHDOG (MISCELLANEOUS) IMPLANT
PACK CARDIAC CATHETERIZATION (CUSTOM PROCEDURE TRAY) ×1 IMPLANT
SHEATH PINNACLE 6F 10CM (SHEATH) IMPLANT
SHEATH PROBE COVER 6X72 (BAG) IMPLANT
TRANSDUCER W/STOPCOCK (MISCELLANEOUS) ×1 IMPLANT
TUBING CIL FLEX 10 FLL-RA (TUBING) ×1 IMPLANT
WIRE EMERALD 3MM-J .035X150CM (WIRE) IMPLANT
WIRE MICRO SET SILHO 5FR 7 (SHEATH) IMPLANT
WIRE RUNTHROUGH .014X180CM (WIRE) IMPLANT

## 2022-09-12 NOTE — ED Provider Notes (Signed)
[place] EMERGENCY DEPARTMENT AT [facility] Provider Note   CSN: [id number] Arrival date & time: 09/12/22  2317     History {Add pertinent medical, surgical, social history, OB history to HPI:1} Chief Complaint  Patient presents with   Chest Pain    Pt with R sided chest pain, worse when she breathes. Evolving STEMI en route to ED. Pt is a dialysis pt on chronic O2 at 3L     Desiree [name] is a 47 y.o. female.   Chest Pain      Home Medications Prior to Admission medications   Medication Sig Start Date End Date Taking? Authorizing Provider  albuterol (PROVENTIL HFA;VENTOLIN HFA) 108 (90 Base) MCG/ACT inhaler Inhale 2 puffs into the lungs every 4 (four) hours as needed for wheezing or shortness of breath. Patient not taking: Reported on 04/30/2022 05/02/16   Lavera Guise, MD  ALPRAZolam Prudy Feeler) 1 MG tablet Take 1 mg by mouth 3 (three) times daily as needed. 11/25/21   [provider]  amoxicillin-clavulanate (AUGMENTIN) 875-125 MG tablet Take 1 tablet by mouth 2 (two) times daily. 04/30/22   Oretha Milch, MD  aspirin EC 81 MG tablet Take 81 mg by mouth daily. Swallow whole.    [provider]  calcium acetate (PHOSLO) 667 MG capsule Take 667-1,334 mg by mouth See admin instructions. 3 capsules with meals  1 to 2 capsules with snacks 11/30/17   [provider]  cinacalcet (SENSIPAR) 60 MG tablet Take 60 mg by mouth every evening.    [provider]  cyclobenzaprine (FLEXERIL) 10 MG tablet Take 10 mg by mouth 2 (two) times daily as needed for muscle spasms.    [provider]  fluticasone furoate-vilanterol (BREO ELLIPTA) 100-25 MCG/ACT AEPB Inhale 1 puff into the lungs daily. 04/30/22   Oretha Milch, MD  lactulose (CHRONULAC) 10 GM/15ML solution Take 30 mLs by mouth daily as needed for mild constipation, moderate constipation or severe constipation. Patient not taking: Reported on 04/30/2022 09/04/21   [provider]  NARCAN 4 MG/0.1ML LIQD nasal spray kit Place 1 spray into the nose as needed (accidental overdose). Patient not taking: Reported on 04/30/2022 11/14/18   [provider]  ondansetron (ZOFRAN-ODT) 8 MG disintegrating tablet Take 8 mg by mouth every 8 (eight) hours as needed for nausea or vomiting.     [provider]  Oxycodone HCl 10 MG TABS Take 1 tablet (10 mg total) by mouth 3 (three) times daily as needed (severe pain). 12/22/21   Arrien, York Ram, MD  pantoprazole (PROTONIX) 20 MG tablet Take 20 mg by mouth daily. 11/25/21   [provider]  sevelamer carbonate (RENVELA) 800 MG tablet Take 1,600 mg by mouth 3 (three) times daily. 05/18/19   [provider]      Allergies    Morphine, Prednisone, [place] Bing allergy], Amlodipine, Iodinated contrast media, and Tape    Review of Systems   Review of Systems  Cardiovascular:  Positive for chest pain.    Physical Exam Updated Vital Signs BP (!) 152/89 (BP Location: Right Arm)   Pulse 84   Resp (!) 26   SpO2 (!) 3%  Physical Exam  ED Results / Procedures / Treatments   Labs (all labs ordered are listed, but only abnormal results are displayed) Labs Reviewed  COMPREHENSIVE METABOLIC PANEL  CBC WITH DIFFERENTIAL/PLATELET  PROTIME-INR  I-STAT CHEM 8, ED  TYPE AND SCREEN  TROPONIN I (HIGH SENSITIVITY)  EKG None  Radiology No results found.  Procedures Procedures  {Document cardiac monitor, telemetry assessment procedure when appropriate:1}  Medications Ordered in ED Medications  fentaNYL (SUBLIMAZE) injection 50 mcg (has no administration in time range)  nitroGLYCERIN (NITROSTAT) SL tablet 0.4 mg (has no administration in time range)  methylPREDNISolone sodium succinate (SOLU-MEDROL) 40 mg/mL injection 40 mg (has no administration in time range)  heparin injection 4,000 Units (has no administration in time range)    ED Course/ Medical Decision Making/ A&P   {   Click here  for ABCD2, HEART and other calculatorsREFRESH Note before signing :1}                          Medical Decision Making Amount and/or Complexity of Data Reviewed Labs: ordered. Radiology: ordered.  Risk Prescription drug management.   ***  {Document critical care time when appropriate:1} {Document review of labs and clinical decision tools ie heart score, Chads2Vasc2 etc:1}  {Document your independent review of radiology images, and any outside records:1} {Document your discussion with family members, caretakers, and with consultants:1} {Document social determinants of health affecting pt's care:1} {Document your decision making why or why not admission, treatments were needed:1} Final Clinical Impression(s) / ED Diagnoses Final diagnoses:  None    Rx / DC Orders ED Discharge Orders     None

## 2022-09-12 NOTE — ED Notes (Signed)
Pt received 324 mg of ASA from EMS

## 2022-09-12 NOTE — ED Notes (Signed)
Cardiologist at bedside.  

## 2022-09-12 NOTE — ED Notes (Signed)
Portable Xray being obtained at this time

## 2022-09-13 ENCOUNTER — Inpatient Hospital Stay (HOSPITAL_COMMUNITY): Payer: 59

## 2022-09-13 ENCOUNTER — Encounter (HOSPITAL_COMMUNITY): Payer: Self-pay | Admitting: Interventional Cardiology

## 2022-09-13 ENCOUNTER — Other Ambulatory Visit (HOSPITAL_COMMUNITY): Payer: 59

## 2022-09-13 DIAGNOSIS — I272 Pulmonary hypertension, unspecified: Secondary | ICD-10-CM | POA: Diagnosis not present

## 2022-09-13 DIAGNOSIS — I213 ST elevation (STEMI) myocardial infarction of unspecified site: Secondary | ICD-10-CM | POA: Diagnosis not present

## 2022-09-13 DIAGNOSIS — M109 Gout, unspecified: Secondary | ICD-10-CM | POA: Diagnosis present

## 2022-09-13 DIAGNOSIS — I4891 Unspecified atrial fibrillation: Secondary | ICD-10-CM

## 2022-09-13 DIAGNOSIS — I10 Essential (primary) hypertension: Secondary | ICD-10-CM | POA: Diagnosis not present

## 2022-09-13 DIAGNOSIS — J9621 Acute and chronic respiratory failure with hypoxia: Secondary | ICD-10-CM | POA: Diagnosis not present

## 2022-09-13 DIAGNOSIS — I132 Hypertensive heart and chronic kidney disease with heart failure and with stage 5 chronic kidney disease, or end stage renal disease: Secondary | ICD-10-CM | POA: Diagnosis not present

## 2022-09-13 DIAGNOSIS — G43909 Migraine, unspecified, not intractable, without status migrainosus: Secondary | ICD-10-CM | POA: Diagnosis not present

## 2022-09-13 DIAGNOSIS — I2510 Atherosclerotic heart disease of native coronary artery without angina pectoris: Secondary | ICD-10-CM

## 2022-09-13 DIAGNOSIS — Z9981 Dependence on supplemental oxygen: Secondary | ICD-10-CM | POA: Diagnosis not present

## 2022-09-13 DIAGNOSIS — N2581 Secondary hyperparathyroidism of renal origin: Secondary | ICD-10-CM | POA: Diagnosis present

## 2022-09-13 DIAGNOSIS — N186 End stage renal disease: Secondary | ICD-10-CM | POA: Diagnosis not present

## 2022-09-13 DIAGNOSIS — I5021 Acute systolic (congestive) heart failure: Secondary | ICD-10-CM | POA: Diagnosis not present

## 2022-09-13 DIAGNOSIS — Z79899 Other long term (current) drug therapy: Secondary | ICD-10-CM | POA: Diagnosis not present

## 2022-09-13 DIAGNOSIS — I240 Acute coronary thrombosis not resulting in myocardial infarction: Secondary | ICD-10-CM

## 2022-09-13 DIAGNOSIS — Z8249 Family history of ischemic heart disease and other diseases of the circulatory system: Secondary | ICD-10-CM | POA: Diagnosis not present

## 2022-09-13 DIAGNOSIS — G8929 Other chronic pain: Secondary | ICD-10-CM | POA: Diagnosis present

## 2022-09-13 DIAGNOSIS — I2109 ST elevation (STEMI) myocardial infarction involving other coronary artery of anterior wall: Secondary | ICD-10-CM

## 2022-09-13 DIAGNOSIS — I2102 ST elevation (STEMI) myocardial infarction involving left anterior descending coronary artery: Secondary | ICD-10-CM

## 2022-09-13 DIAGNOSIS — K219 Gastro-esophageal reflux disease without esophagitis: Secondary | ICD-10-CM | POA: Diagnosis present

## 2022-09-13 DIAGNOSIS — R079 Chest pain, unspecified: Secondary | ICD-10-CM | POA: Diagnosis present

## 2022-09-13 DIAGNOSIS — Z91041 Radiographic dye allergy status: Secondary | ICD-10-CM | POA: Diagnosis not present

## 2022-09-13 DIAGNOSIS — Z992 Dependence on renal dialysis: Secondary | ICD-10-CM | POA: Diagnosis not present

## 2022-09-13 DIAGNOSIS — I4819 Other persistent atrial fibrillation: Secondary | ICD-10-CM

## 2022-09-13 DIAGNOSIS — I480 Paroxysmal atrial fibrillation: Secondary | ICD-10-CM

## 2022-09-13 DIAGNOSIS — I255 Ischemic cardiomyopathy: Secondary | ICD-10-CM | POA: Diagnosis present

## 2022-09-13 DIAGNOSIS — F419 Anxiety disorder, unspecified: Secondary | ICD-10-CM | POA: Diagnosis present

## 2022-09-13 DIAGNOSIS — D631 Anemia in chronic kidney disease: Secondary | ICD-10-CM | POA: Diagnosis not present

## 2022-09-13 DIAGNOSIS — J9611 Chronic respiratory failure with hypoxia: Secondary | ICD-10-CM

## 2022-09-13 DIAGNOSIS — I3139 Other pericardial effusion (noninflammatory): Secondary | ICD-10-CM | POA: Diagnosis not present

## 2022-09-13 DIAGNOSIS — I081 Rheumatic disorders of both mitral and tricuspid valves: Secondary | ICD-10-CM | POA: Diagnosis present

## 2022-09-13 LAB — POCT ACTIVATED CLOTTING TIME
Activated Clotting Time: 183 seconds
Activated Clotting Time: 721 seconds
Activated Clotting Time: 165 seconds

## 2022-09-13 LAB — GLUCOSE, CAPILLARY: Glucose-Capillary: 109 mg/dL — ABNORMAL HIGH (ref 70–99)

## 2022-09-13 LAB — BASIC METABOLIC PANEL WITH GFR
Anion gap: 14 (ref 5–15)
BUN: 30 mg/dL — ABNORMAL HIGH (ref 6–20)
CO2: 27 mmol/L (ref 22–32)
Calcium: 8.9 mg/dL (ref 8.9–10.3)
Chloride: 96 mmol/L — ABNORMAL LOW (ref 98–111)
Creatinine, Ser: 6.11 mg/dL — ABNORMAL HIGH (ref 0.44–1.00)
GFR, Estimated: 8 mL/min — ABNORMAL LOW
Glucose, Bld: 133 mg/dL — ABNORMAL HIGH (ref 70–99)
Potassium: 3.3 mmol/L — ABNORMAL LOW (ref 3.5–5.1)
Sodium: 137 mmol/L (ref 135–145)

## 2022-09-13 LAB — COMPREHENSIVE METABOLIC PANEL
ALT: 22 U/L (ref 0–44)
AST: 26 U/L (ref 15–41)
Albumin: 3.5 g/dL (ref 3.5–5.0)
Alkaline Phosphatase: 77 U/L (ref 38–126)
Anion gap: 19 — ABNORMAL HIGH (ref 5–15)
BUN: 30 mg/dL — ABNORMAL HIGH (ref 6–20)
CO2: 25 mmol/L (ref 22–32)
Calcium: 9.3 mg/dL (ref 8.9–10.3)
Chloride: 95 mmol/L — ABNORMAL LOW (ref 98–111)
Creatinine, Ser: 6.06 mg/dL — ABNORMAL HIGH (ref 0.44–1.00)
GFR, Estimated: 8 mL/min — ABNORMAL LOW (ref >60)
Glucose, Bld: 104 mg/dL — ABNORMAL HIGH (ref 70–99)
Potassium: 4.1 mmol/L (ref 3.5–5.1)
Sodium: 139 mmol/L (ref 135–145)
Total Bilirubin: 0.6 mg/dL (ref 0.3–1.2)
Total Protein: 8.6 g/dL — ABNORMAL HIGH (ref 6.5–8.1)

## 2022-09-13 LAB — ECHOCARDIOGRAM COMPLETE BUBBLE STUDY
MV VTI: 1.06 cm2
S' Lateral: 3 cm

## 2022-09-13 LAB — TROPONIN I (HIGH SENSITIVITY): Troponin I (High Sensitivity): 40 ng/L — ABNORMAL HIGH (ref <18)

## 2022-09-13 LAB — CBC
HCT: 39 % (ref 36.0–46.0)
Hemoglobin: 12 g/dL (ref 12.0–15.0)
MCH: 25.9 pg — ABNORMAL LOW (ref 26.0–34.0)
MCHC: 30.8 g/dL (ref 30.0–36.0)
MCV: 84.2 fL (ref 80.0–100.0)
Platelets: 138 K/uL — ABNORMAL LOW (ref 150–400)
RBC: 4.63 MIL/uL (ref 3.87–5.11)
RDW: 18.1 % — ABNORMAL HIGH (ref 11.5–15.5)
WBC: 4.4 K/uL (ref 4.0–10.5)
nRBC: 0 % (ref 0.0–0.2)

## 2022-09-13 LAB — MRSA NEXT GEN BY PCR, NASAL: MRSA by PCR Next Gen: NOT DETECTED

## 2022-09-13 LAB — PHOSPHORUS: Phosphorus: 6.6 mg/dL — ABNORMAL HIGH (ref 2.5–4.6)

## 2022-09-13 LAB — HEPARIN LEVEL (UNFRACTIONATED): Heparin Unfractionated: 0.24 IU/mL — ABNORMAL LOW (ref 0.30–0.70)

## 2022-09-13 LAB — HEPATITIS B SURFACE ANTIGEN: Hepatitis B Surface Ag: NONREACTIVE

## 2022-09-13 LAB — TYPE AND SCREEN

## 2022-09-13 MED ORDER — SODIUM CHLORIDE 0.9% FLUSH
3.0000 mL | Freq: Two times a day (BID) | INTRAVENOUS | Status: DC
  Administered 2022-09-13 – 2022-09-16 (×6): 3 mL via INTRAVENOUS

## 2022-09-13 MED ORDER — DOXERCALCIFEROL 4 MCG/2ML IV SOLN
4.0000 ug | INTRAVENOUS | Status: DC
  Administered 2022-09-17: 4 ug via INTRAVENOUS
  Filled 2022-09-13 (×3): qty 2

## 2022-09-13 MED ORDER — ASPIRIN 81 MG PO CHEW
81.0000 mg | CHEWABLE_TABLET | Freq: Every day | ORAL | Status: DC
Start: 2022-09-14 — End: 2022-09-13

## 2022-09-13 MED ORDER — ALBUTEROL SULFATE HFA 108 (90 BASE) MCG/ACT IN AERS: 2.0000 | INHALATION_SPRAY | RESPIRATORY_TRACT | Status: DC | PRN

## 2022-09-13 MED ORDER — HEPARIN SODIUM (PORCINE) 1000 UNIT/ML IJ SOLN
INTRAMUSCULAR | Status: DC | PRN
  Administered 2022-09-13: 5000 [IU] via INTRAVENOUS
  Administered 2022-09-13: 1000 [IU] via INTRAVENOUS

## 2022-09-13 MED ORDER — CALCIUM ACETATE (PHOS BINDER) 667 MG PO CAPS: 667.0000 mg | ORAL_CAPSULE | ORAL | Status: DC

## 2022-09-13 MED ORDER — CHLORHEXIDINE GLUCONATE CLOTH 2 % EX PADS: 6.0000 | MEDICATED_PAD | Freq: Every day | CUTANEOUS | Status: DC

## 2022-09-13 MED ORDER — SEVELAMER CARBONATE 800 MG PO TABS
1600.0000 mg | ORAL_TABLET | Freq: Three times a day (TID) | ORAL | Status: DC
  Administered 2022-09-13 – 2022-09-17 (×12): 1600 mg via ORAL
  Filled 2022-09-13 (×12): qty 2

## 2022-09-13 MED ORDER — ATROPINE SULFATE 1 MG/10ML IJ SOSY
PREFILLED_SYRINGE | INTRAMUSCULAR | Status: AC
  Filled 2022-09-13: qty 10

## 2022-09-13 MED ORDER — SODIUM CHLORIDE 0.9% FLUSH: 3.0000 mL | INTRAVENOUS | Status: DC | PRN

## 2022-09-13 MED ORDER — HEPARIN (PORCINE) 25000 UT/250ML-% IV SOLN
1150.0000 [IU]/h | INTRAVENOUS | Status: DC
  Administered 2022-09-13: 900 [IU]/h via INTRAVENOUS
  Administered 2022-09-14: 1050 [IU]/h via INTRAVENOUS
  Administered 2022-09-15: 1150 [IU]/h via INTRAVENOUS
  Filled 2022-09-13 (×3): qty 250

## 2022-09-13 MED ORDER — OXYCODONE HCL 5 MG PO TABS
10.0000 mg | ORAL_TABLET | Freq: Three times a day (TID) | ORAL | Status: DC | PRN
  Administered 2022-09-13 – 2022-09-17 (×8): 10 mg via ORAL
  Filled 2022-09-13 (×8): qty 2

## 2022-09-13 MED ORDER — PANTOPRAZOLE SODIUM 20 MG PO TBEC
20.0000 mg | DELAYED_RELEASE_TABLET | Freq: Every day | ORAL | Status: DC
  Administered 2022-09-13 – 2022-09-17 (×5): 20 mg via ORAL
  Filled 2022-09-13 (×5): qty 1

## 2022-09-13 MED ORDER — FLUTICASONE FUROATE-VILANTEROL 100-25 MCG/ACT IN AEPB
1.0000 | INHALATION_SPRAY | Freq: Every day | RESPIRATORY_TRACT | Status: DC
  Administered 2022-09-13 – 2022-09-15 (×3): 1 via RESPIRATORY_TRACT
  Filled 2022-09-13: qty 28

## 2022-09-13 MED ORDER — IOHEXOL 350 MG/ML SOLN
INTRAVENOUS | Status: DC | PRN
  Administered 2022-09-13: 70 mL

## 2022-09-13 MED ORDER — NEPRO/CARBSTEADY PO LIQD
237.0000 mL | Freq: Two times a day (BID) | ORAL | Status: DC
  Administered 2022-09-13 – 2022-09-15 (×4): 237 mL via ORAL

## 2022-09-13 MED ORDER — CLOPIDOGREL BISULFATE 300 MG PO TABS
ORAL_TABLET | ORAL | Status: AC
  Filled 2022-09-13: qty 2

## 2022-09-13 MED ORDER — LIDOCAINE HCL (PF) 1 % IJ SOLN
INTRAMUSCULAR | Status: DC | PRN
  Administered 2022-09-13: 15 mL via INTRADERMAL

## 2022-09-13 MED ORDER — NALOXONE HCL 4 MG/0.1ML NA LIQD: 1.0000 | NASAL | Status: DC | PRN

## 2022-09-13 MED ORDER — CLOPIDOGREL BISULFATE 300 MG PO TABS
ORAL_TABLET | ORAL | Status: DC | PRN
  Administered 2022-09-13: 600 mg via ORAL

## 2022-09-13 MED ORDER — LABETALOL HCL 5 MG/ML IV SOLN
10.0000 mg | INTRAVENOUS | Status: DC | PRN
  Administered 2022-09-13 (×2): 10 mg via INTRAVENOUS
  Filled 2022-09-13: qty 4

## 2022-09-13 MED ORDER — HYDROMORPHONE HCL 1 MG/ML IJ SOLN
0.5000 mg | INTRAMUSCULAR | Status: DC | PRN
  Administered 2022-09-13 – 2022-09-16 (×5): 1 mg via INTRAVENOUS
  Administered 2022-09-17: 0.5 mg via INTRAVENOUS
  Filled 2022-09-13 (×6): qty 1

## 2022-09-13 MED ORDER — FENTANYL CITRATE (PF) 100 MCG/2ML IJ SOLN
INTRAMUSCULAR | Status: AC
  Filled 2022-09-13: qty 2

## 2022-09-13 MED ORDER — SODIUM CHLORIDE 0.9 % IV SOLN: 250.0000 mL | INTRAVENOUS | Status: DC | PRN

## 2022-09-13 MED ORDER — IPRATROPIUM-ALBUTEROL 0.5-2.5 (3) MG/3ML IN SOLN
3.0000 mL | Freq: Four times a day (QID) | RESPIRATORY_TRACT | Status: DC | PRN
  Administered 2022-09-13: 3 mL via RESPIRATORY_TRACT
  Filled 2022-09-13: qty 3

## 2022-09-13 MED ORDER — CALCIUM CARBONATE ANTACID 500 MG PO CHEW
1.0000 | CHEWABLE_TABLET | Freq: Four times a day (QID) | ORAL | Status: DC | PRN
  Administered 2022-09-14 – 2022-09-15 (×2): 200 mg via ORAL
  Filled 2022-09-13 (×2): qty 1

## 2022-09-13 MED ORDER — MIDAZOLAM HCL 2 MG/2ML IJ SOLN
INTRAMUSCULAR | Status: DC | PRN
  Administered 2022-09-13: 1 mg via INTRAVENOUS

## 2022-09-13 MED ORDER — POTASSIUM CHLORIDE CRYS ER 20 MEQ PO TBCR
40.0000 meq | EXTENDED_RELEASE_TABLET | Freq: Once | ORAL | Status: AC
  Administered 2022-09-13: 40 meq via ORAL
  Filled 2022-09-13: qty 2

## 2022-09-13 MED ORDER — HYDRALAZINE HCL 25 MG PO TABS
25.0000 mg | ORAL_TABLET | Freq: Three times a day (TID) | ORAL | Status: DC
  Administered 2022-09-13 – 2022-09-15 (×7): 25 mg via ORAL
  Filled 2022-09-13 (×9): qty 1

## 2022-09-13 MED ORDER — ISOSORBIDE MONONITRATE ER 30 MG PO TB24
30.0000 mg | ORAL_TABLET | Freq: Every day | ORAL | Status: DC
  Administered 2022-09-13 – 2022-09-17 (×4): 30 mg via ORAL
  Filled 2022-09-13 (×5): qty 1

## 2022-09-13 MED ORDER — HYDRALAZINE HCL 20 MG/ML IJ SOLN: 10.0000 mg | Freq: Four times a day (QID) | INTRAMUSCULAR | Status: DC | PRN

## 2022-09-13 MED ORDER — FENTANYL CITRATE (PF) 100 MCG/2ML IJ SOLN
INTRAMUSCULAR | Status: DC | PRN
  Administered 2022-09-13: 25 ug via INTRAVENOUS

## 2022-09-13 MED ORDER — HEPARIN SODIUM (PORCINE) 1000 UNIT/ML IJ SOLN
INTRAMUSCULAR | Status: AC
  Filled 2022-09-13: qty 10

## 2022-09-13 MED ORDER — ASPIRIN 81 MG PO TBEC
81.0000 mg | DELAYED_RELEASE_TABLET | Freq: Every day | ORAL | Status: DC
  Administered 2022-09-13 – 2022-09-14 (×2): 81 mg via ORAL
  Filled 2022-09-13 (×2): qty 1

## 2022-09-13 MED ORDER — HYDRALAZINE HCL 20 MG/ML IJ SOLN
10.0000 mg | INTRAMUSCULAR | Status: DC | PRN
  Administered 2022-09-13 (×2): 10 mg via INTRAVENOUS
  Filled 2022-09-13: qty 1

## 2022-09-13 MED ORDER — CLOPIDOGREL BISULFATE 75 MG PO TABS
75.0000 mg | ORAL_TABLET | Freq: Every day | ORAL | Status: DC
  Administered 2022-09-14 – 2022-09-17 (×4): 75 mg via ORAL
  Filled 2022-09-13 (×4): qty 1

## 2022-09-13 MED ORDER — ONDANSETRON 4 MG PO TBDP
8.0000 mg | ORAL_TABLET | Freq: Three times a day (TID) | ORAL | Status: DC | PRN
  Administered 2022-09-13 – 2022-09-14 (×2): 8 mg via ORAL
  Filled 2022-09-13 (×4): qty 2

## 2022-09-13 MED ORDER — FERRIC CITRATE 1 GM 210 MG(FE) PO TABS
210.0000 mg | ORAL_TABLET | Freq: Three times a day (TID) | ORAL | Status: DC
  Administered 2022-09-13 – 2022-09-17 (×11): 210 mg via ORAL
  Filled 2022-09-13 (×15): qty 1

## 2022-09-13 MED ORDER — CYCLOBENZAPRINE HCL 10 MG PO TABS: 10.0000 mg | ORAL_TABLET | Freq: Two times a day (BID) | ORAL | Status: DC | PRN

## 2022-09-13 MED ORDER — MIDAZOLAM HCL 2 MG/2ML IJ SOLN
INTRAMUSCULAR | Status: AC
  Filled 2022-09-13: qty 2

## 2022-09-13 MED ORDER — CINACALCET HCL 30 MG PO TABS
60.0000 mg | ORAL_TABLET | Freq: Every day | ORAL | Status: DC
  Administered 2022-09-14 – 2022-09-16 (×2): 60 mg via ORAL
  Filled 2022-09-13 (×7): qty 2

## 2022-09-13 MED ORDER — ALPRAZOLAM 0.5 MG PO TABS
1.0000 mg | ORAL_TABLET | Freq: Three times a day (TID) | ORAL | Status: DC | PRN
  Administered 2022-09-13 – 2022-09-17 (×7): 1 mg via ORAL
  Filled 2022-09-13 (×9): qty 2

## 2022-09-13 MED ORDER — CINACALCET HCL 30 MG PO TABS: 60.0000 mg | ORAL_TABLET | Freq: Every evening | ORAL | Status: DC

## 2022-09-13 MED ORDER — LACTULOSE 10 GM/15ML PO SOLN: 20.0000 g | Freq: Every day | ORAL | Status: DC | PRN

## 2022-09-13 MED ORDER — CHLORHEXIDINE GLUCONATE CLOTH 2 % EX PADS
6.0000 | MEDICATED_PAD | Freq: Every day | CUTANEOUS | Status: DC
  Administered 2022-09-13 – 2022-09-15 (×3): 6 via TOPICAL

## 2022-09-13 MED ORDER — ACETAMINOPHEN 325 MG PO TABS: 650.0000 mg | ORAL_TABLET | ORAL | Status: DC | PRN

## 2022-09-13 MED ORDER — METOPROLOL TARTRATE 25 MG PO TABS
25.0000 mg | ORAL_TABLET | Freq: Two times a day (BID) | ORAL | Status: DC
  Administered 2022-09-13 – 2022-09-16 (×7): 25 mg via ORAL
  Filled 2022-09-13 (×7): qty 1

## 2022-09-13 NOTE — Progress Notes (Signed)
Nurse paged to confirm echo was ordered since not listed under active orders. When an echo gets scheduled by echo team it is removed from active order set. She is ordered for echocardiogram. Dr. Marylene Buerger note specifies echo with bubble study so order was updated to reflect this, also notified Candace with echo team to clarify the request.

## 2022-09-13 NOTE — Progress Notes (Signed)
ANTICOAGULATION CONSULT NOTE - Initial Consult  Pharmacy Consult for Heparin  Indication: chest pain/ACS, s/p cath   Allergies  Allergen Reactions   Morphine Shortness Of Breath and Anaphylaxis   Prednisone Other (See Comments)    Other reaction(s): Other (See Comments) Muscle spasms Patient says prednisone causes her to cramp all over, muscle spasms uncontrolled   Tuna [Fish Allergy] Itching, Swelling, Rash and Other (See Comments)    Face droops also   Amlodipine Other (See Comments)    Angioedema (09/05/17 ED visit)   Iodinated Contrast Media Itching   Tape Itching    Adhesive tape   paper tape ok    Patient Measurements: Height: 5\' 4"  (162.6 cm) Weight: 74 kg (163 lb 2.3 oz) IBW/kg (Calculated) : 54.7  Vital Signs: Temp: 97.8 F (36.6 C) (06/29 2336) Temp Source: Oral (06/29 2336) BP: 135/75 (06/30 0430) Pulse Rate: 90 (06/30 0430)  Labs: Recent Labs    09/12/22 2330 09/13/22 0141  HGB 12.2  14.3 12.0  HCT 40.0  42.0 39.0  PLT 145* 138*  LABPROT 15.1  --   INR 1.2  --   CREATININE 6.06*  6.20* 6.11*  TROPONINIHS 40*  --     Estimated Creatinine Clearance: 11.3 mL/min (A) (by C-G formula based on SCr of 6.11 mg/dL (H)).   Medical History: Past Medical History:  Diagnosis Date   Anemia    Anxiety    panic attacks   Arthritis    bilateral knees   Childhood asthma    Complication of anesthesia    "sometimes it does not work; didn't during LEEP OR" (01/21/2016)   Depression    no med   ESRD (end stage renal disease) on dialysis (HCC)    "TTS; [facility]" (08/03/2016)   GERD (gastroesophageal reflux disease)    nexium prn   Gout    History of blood transfusion    "related to kidneys; I've had 4" (01/21/2016)   Hypertension    Migraine    last one 01/18/19   Preterm labor ~ 2014   Retina hole, left    Seizures (HCC)    "last one was in 2000; related to preeclampsia" (01/21/2016)     Assessment: 47 y/o F CODE STEMI s/p cath,  no atherosclerotic disease, occlusions appeared thrombotic per MD, starting heparin 6 hours after sheath removal per MD note, sheath out ~0515  Goal of Therapy:  Heparin level 0.3-0.7 units/ml Monitor platelets by anticoagulation protocol: Yes   Plan:  No bolus s/p cath Start heparin drip at 900 units/hr at 1100 today 1900 heparin level Monitor for bleeding  Abran Duke, PharmD, BCPS Clinical Pharmacist Phone: [phone]

## 2022-09-13 NOTE — Progress Notes (Signed)
  Echocardiogram 2D Echocardiogram has been performed.  Desiree [name] 09/13/2022, 5:26 PM

## 2022-09-13 NOTE — Progress Notes (Signed)
Chaplain responding to Code STEMI- PT unavailable as medical team provides care.  Chaplain eventually able to speak with PT whop states she does not believe any support persona are coming, but they do know where she is.  Medical team continues to provide care, so chaplain will move on. Chaplain services remain available, however, by paging Spiritual Care.

## 2022-09-13 NOTE — Progress Notes (Signed)
Cardiology Progress Note  Patient ID: Desiree [name] MRN: [id number] DOB: 12-13-1975 Date of Encounter: 09/13/2022  Primary Cardiologist: Jodelle Red, MD  Subjective   Chief Complaint: Chest pain  HPI: Reports chest pain.  EKG with atrial fibrillation anterolateral T wave inversions.  ST elevation has resolved.  ROS:  All other ROS reviewed and negative. Pertinent positives noted in the HPI.     Inpatient Medications  Scheduled Meds:  aspirin EC  81 mg Oral Daily   Chlorhexidine Gluconate Cloth  6 each Topical Daily   [START ON 09/14/2022] clopidogrel  75 mg Oral Q breakfast   feeding supplement (NEPRO CARB STEADY)  237 mL Oral BID BM   ferric citrate  210 mg Oral TID WC   fluticasone furoate-vilanterol  1 puff Inhalation Daily   hydrALAZINE  25 mg Oral Q8H   isosorbide mononitrate  30 mg Oral Daily   metoprolol tartrate  25 mg Oral BID   pantoprazole  20 mg Oral Daily   potassium chloride  40 mEq Oral Once   sevelamer carbonate  1,600 mg Oral TID WC   sodium chloride flush  3 mL Intravenous Q12H   Continuous Infusions:  sodium chloride 10 mL/hr at 09/13/22 0700   heparin     PRN Meds: sodium chloride, acetaminophen, ALPRAZolam, cyclobenzaprine, hydrALAZINE, HYDROmorphone (DILAUDID) injection, ipratropium-albuterol, lactulose, nitroGLYCERIN, ondansetron, oxyCODONE, sodium chloride flush   Vital Signs   Vitals:   09/13/22 0615 09/13/22 0630 09/13/22 0645 09/13/22 0700  BP: 139/80 137/86 (!) 148/84 (!) 141/84  Pulse: 94 90 (!) 105 (!) 101  Resp: (!) 29 (!) 23 (!) 30 (!) 23  Temp:      TempSrc:      SpO2:  100% 100% 99%  Weight:      Height:        Intake/Output Summary (Last 24 hours) at 09/13/2022 0812 Last data filed at 09/13/2022 0700 Gross per 24 hour  Intake 54.16 ml  Output --  Net 54.16 ml      09/13/2022    5:00 AM 09/12/2022   11:37 PM 04/30/2022    9:39 AM  Last 3 Weights  Weight (lbs) 168 lb 3.4 oz 163 lb 2.3 oz 185 lb  Weight (kg) 76.3  kg 74 kg 83.915 kg      Telemetry  Overnight telemetry shows A-fib heart rate 90s, which I personally reviewed.   ECG  The most recent ECG shows atrial fibrillation, anterior lateral T wave inversions, which I personally reviewed.   Physical Exam   Vitals:   09/13/22 0615 09/13/22 0630 09/13/22 0645 09/13/22 0700  BP: 139/80 137/86 (!) 148/84 (!) 141/84  Pulse: 94 90 (!) 105 (!) 101  Resp: (!) 29 (!) 23 (!) 30 (!) 23  Temp:      TempSrc:      SpO2:  100% 100% 99%  Weight:      Height:        Intake/Output Summary (Last 24 hours) at 09/13/2022 0812 Last data filed at 09/13/2022 0700 Gross per 24 hour  Intake 54.16 ml  Output --  Net 54.16 ml       09/13/2022    5:00 AM 09/12/2022   11:37 PM 04/30/2022    9:39 AM  Last 3 Weights  Weight (lbs) 168 lb 3.4 oz 163 lb 2.3 oz 185 lb  Weight (kg) 76.3 kg 74 kg 83.915 kg    Body mass index is 28.87 kg/m.  General: Well nourished, well developed, in no  acute distress Head: Atraumatic, normal size  Eyes: PEERLA, EOMI  Neck: Supple, no JVD Endocrine: No thryomegaly Cardiac: Normal S1, S2; irregular rhythm, no murmurs Lungs: Clear to auscultation bilaterally, no wheezing, rhonchi or rales  Abd: Soft, nontender, no hepatomegaly  Ext: No edema, pulses 2+ Musculoskeletal: No deformities, BUE and BLE strength normal and equal Skin: Warm and dry, no rashes   Neuro: Alert and oriented to person, place, time, and situation, CNII-XII grossly intact, no focal deficits  Psych: Normal mood and affect   Labs  High Sensitivity Troponin:   Recent Labs  Lab 09/12/22 2330  TROPONINIHS 40*     Cardiac EnzymesNo results for input(s): "TROPONINI" in the last 168 hours. No results for input(s): "TROPIPOC" in the last 168 hours.  Chemistry Recent Labs  Lab 09/12/22 2330 09/13/22 0141  NA 139  138 137  K 4.1  4.1 3.3*  CL 95*  99 96*  CO2 25 27  GLUCOSE 104*  102* 133*  BUN 30*  38* 30*  CREATININE 6.06*  6.20* 6.11*  CALCIUM  9.3 8.9  PROT 8.6*  --   ALBUMIN 3.5  --   AST 26  --   ALT 22  --   ALKPHOS 77  --   BILITOT 0.6  --   GFRNONAA 8* 8*  ANIONGAP 19* 14    Hematology Recent Labs  Lab 09/12/22 2330 09/13/22 0141  WBC 5.4 4.4  RBC 4.66 4.63  HGB 12.2  14.3 12.0  HCT 40.0  42.0 39.0  MCV 85.8 84.2  MCH 26.2 25.9*  MCHC 30.5 30.8  RDW 18.3* 18.1*  PLT 145* 138*   BNPNo results for input(s): "BNP", "PROBNP" in the last 168 hours.  DDimer No results for input(s): "DDIMER" in the last 168 hours.   Radiology  CARDIAC CATHETERIZATION  Addendum Date: 09/13/2022     Lat 3rd Mrg lesion is 100% stenosed.  Balloon angioplasty was performed using a BALLN EMERGE MR 2.0X12.   Post intervention, there is a 0% residual stenosis.   Dist LAD lesion is 100% stenosed.  Balloon angioplasty was performed using a BALLN EMERGE MR 2.0X12.   Post intervention, there is a 100% residual stenosis.   Mid Cx lesion is 50% stenosed.  Thrombotic.   LV end diastolic pressure is moderately elevated.   Recommend to resume Apixaban, at currently prescribed dose and frequency.   Recommend concurrent antiplatelet therapy of Clopidogrel 75mg  daily for 12 months. No atherosclerotic coronary artery disease.  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  Angioplasty done in both areas but neither area receiving TIMI-3 flow.  Resume heparin 6 hours after femoral sheath removal.  She will need echocardiogram to look for source of embolism.  Would consider long-term oral anticoagulation given her presentation here today.  Could also use clopidogrel for antiplatelet therapy depending on whether or not she has bleeding issues.  Will watch in 2 heart. I spoke to her daughter, Mel Almond, and conveyed the results.  Result Date: 09/13/2022   Lat 3rd Mrg lesion is 100% stenosed.  Balloon angioplasty was performed using a BALLN EMERGE MR 2.0X12.   Post intervention, there is a 0%  residual stenosis.   Dist LAD lesion is 100% stenosed.  Balloon angioplasty was performed using a BALLN EMERGE MR 2.0X12.   Post intervention, there is a 100% residual stenosis.   Mid Cx lesion is 50% stenosed.  Thrombotic.  LV end diastolic pressure is moderately elevated.   Recommend to resume Apixaban, at currently prescribed dose and frequency.   Recommend concurrent antiplatelet therapy of Clopidogrel 75mg  daily for 12 months. No atherosclerotic coronary artery disease.  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  Angioplasty done in both areas but neither area receiving TIMI-3 flow.  Resume heparin 6 hours after femoral sheath removal.  She will need echocardiogram to look for source of embolism.  Would consider long-term oral anticoagulation given her presentation here today.  Could also use clopidogrel for antiplatelet therapy depending on whether or not she has bleeding issues.  Will watch in 2 heart. I spoke to her daughter, Mel Almond, and conveyed the results.   DG Chest Port 1 View  Result Date: 09/13/2022 CLINICAL DATA:  Chest pain EXAM: PORTABLE CHEST 1 VIEW COMPARISON:  04/30/2022 FINDINGS: Cardiomegaly with mild perihilar edema. No definite pleural effusions. No pneumothorax. IMPRESSION: Cardiomegaly with mild perihilar edema. Electronically Signed   By: Charline Bills M.D.   On: 09/13/2022 00:16    Cardiac Studies  LHC 09/13/2022   Lat 3rd Mrg lesion is 100% stenosed.  Balloon angioplasty was performed using a BALLN EMERGE MR 2.0X12.   Post intervention, there is a 0% residual stenosis.   Dist LAD lesion is 100% stenosed.  Balloon angioplasty was performed using a BALLN EMERGE MR 2.0X12.   Post intervention, there is a 100% residual stenosis.   Mid Cx lesion is 50% stenosed.  Thrombotic.   LV end diastolic pressure is moderately elevated.   Recommend to resume Apixaban, at currently prescribed dose and  frequency.   Recommend concurrent antiplatelet therapy of Clopidogrel 75mg  daily for 12 months.  Patient Profile  Desiree [name] is a 47 y.o. female with ESRD, chronic hypoxic respiratory failure (thought to be related to extra volume), hypertension, diastolic heart failure, seizures who was admitted on 09/13/2022 for anterior STEMI.  Assessment & Plan   # Anterior STEMI # Thromboembolic event # New onset A-fib -Admitted with thromboembolic event to the coronary arteries.  Now found to have atrial fibrillation.  Suspect this could be etiology. -Status post angioplasty to OM 3, distal LAD.  She did have a 50% lesion in the circumflex. -Did not have good result in the distal LAD.  Continues to have chest discomfort.  Suspect this is undoubtedly related to the distal LAD which was not fully revascularized. -We will add Imdur 30 mg daily to help with this.  I will also add morphine.  Her EKG shows her ST elevation has resolved.  We will just have to treat this medically.  Dilaudid has been ordered by interventional cardiology.  Will continue this. -Continue aspirin and Plavix given angioplasty.  She is currently on heparin drip for new onset A-fib.  Will add metoprolol to tartrate 25 mg twice daily for rate control.  She will need to be switched to Plavix and Eliquis in the next few days.  We will start Eliquis once we are able per interventional cardiology. -She has been pulled.  She will remain on bedrest for the next 4 hours.  She will remain in the ICU today. -Echo pending.  TSH lipids and A1c for tomorrow.  LP(a) in process. -We will complete her echo with a bubble study.  However given A-fib I think this could be an explanation for the thromboembolic event.  # HTN -Add metoprolol.  Add hydralazine 25  mg 3 times daily.  Imdur also as above.  # ESRD -Tuesday Thursday Saturday.  Consult renal for management of this.  # History of seizures -On no medications  # Chronic respiratory  failure -Thought to be related to volume overload.  Renal will evaluate her this hospitalization.  FEN -No intravenous fluids -Code: Full -Diet: Heart healthy -DVT PPx: Heparin drip -Disposition: Remain in ICU today   For questions or updates, please contact [facility] Please consult [email or web address] for contact info under   CRITICAL CARE Performed by: Gerri Spore T O'Neal  Total critical care time: 45 minutes. Critical care time was exclusive of separately billable procedures and treating other patients. Critical care was necessary to treat or prevent imminent or life-threatening deterioration. Critical care was time spent personally by me on the following activities: development of treatment plan with patient and/or surrogate as well as nursing, discussions with consultants, evaluation of patient's response to treatment, examination of patient, obtaining history from patient or surrogate, ordering and performing treatments and interventions, ordering and review of laboratory studies, ordering and review of radiographic studies, pulse oximetry and re-evaluation of patient's condition.  Signed, Lenna Gilford. Flora Lipps, MD, [facility]  [facility]  09/13/2022 8:25 AM

## 2022-09-13 NOTE — Progress Notes (Signed)
8295 Arterial Sheath pulled per order. Prior to pull site a level 0. Manual pressure held for 20 minutes. Site level 0 after Sheath Pull. Pt remained hemodynamically stable throughout sheath pull and after.   Patient educated on signs and symptoms of r/p bleed.

## 2022-09-13 NOTE — Progress Notes (Signed)
eLink Physician-Brief Progress Note Patient Name: Desiree [name] DOB: [birth year] MRN: [id number]   Date of Service  09/13/2022  HPI/Events of Note  Patient presenting with chest pain with EKG changes of acute anterior STEMI.  eICU Interventions  Admission reviewed.     Intervention Category Evaluation Type: New Patient Evaluation  Carilyn Goodpasture 09/13/2022, 3:51 AM

## 2022-09-13 NOTE — H&P (Addendum)
Cardiology Admission History and Physical   Patient ID: Desiree [name] MRN: [id number]; DOB: Feb 06, 1976   Admission date: 09/12/2022  PCP:  Jearld Lesch, MD   [facility] Providers Cardiologist:  Jodelle Red, MD        Chief Complaint:  Chest pain  Patient Profile:   Desiree [name] is a 47 y.o. female with pmh sx for ESRD, HTN, Seizures, Migraine, and Gout  who is being seen 09/13/2022 for the evaluation of Acute Anterior STEMI.  History of Present Illness:   Desiree [name] is a 48 y.o. female with pmh sx for ESRD, HTN, Seizures, Migraine, and Gout  who is being seen 09/13/2022 for the evaluation of Acute Anterior STEMI. She is on chronic 3 liters of oxygen at home. Today suddenly started having severe crushing chest pain. 10/10. Had dialysis today. Called EMS. Was found to have anterior ST elevations hence code STEMI was activated. Pain associated with SOB and N/V. Not relieved by nitroglycerin. She has a contrast allergy so was given 100 steroids. NO prior cardiac hx of stents. In the ED, BP was 150s. Bedside Echo showed no effusion. She is dialyzed T/Th/Sat.    Past Medical History:  Diagnosis Date   Anemia    Anxiety    panic attacks   Arthritis    bilateral knees   Childhood asthma    Complication of anesthesia    "sometimes it does not work; didn't during LEEP OR" (01/21/2016)   Depression    no med   ESRD (end stage renal disease) on dialysis (HCC)    "TTS; [facility]; [facility]" (08/03/2016)   GERD (gastroesophageal reflux disease)    nexium prn   Gout    History of blood transfusion    "related to kidneys; I've had 4" (01/21/2016)   Hypertension    Migraine    last one 01/18/19   Preterm labor ~ 2014   Retina hole, left    Seizures (HCC)    "last one was in 2000; related to preeclampsia" (01/21/2016)    Past Surgical History:  Procedure Laterality Date   A/V FISTULAGRAM Left 12/19/2021   Procedure: A/V Fistulagram;  Surgeon:  Chuck Hint, MD;  Location: [facility] INVASIVE CV LAB;  Service: Cardiovascular;  Laterality: Left;   AV FISTULA PLACEMENT Left 2010   AV FISTULA PLACEMENT Left 01/20/2019   Procedure: ARTERIOVENOUS (AV) FISTULA CREATION LEFT UPPER ARM;  Surgeon: Chuck Hint, MD;  Location: [facility] OR;  Service: Vascular;  Laterality: Left;   AV FISTULA PLACEMENT Left 12/19/2021   Procedure: ARTERIOVENOUS (AV) FISTULA CREATION POSSIBLE TUNNELED DIALYSIS;  Surgeon: Maeola Harman, MD;  Location: [facility] OR;  Service: Vascular;  Laterality: Left;   CERVICAL BIOPSY  W/ LOOP ELECTRODE EXCISION  2001   DILATION AND EVACUATION  08/02/2011   Procedure: DILATATION AND EVACUATION;  Surgeon: Oliver Pila, MD;  Location: [facility] ORS;  Service: Gynecology;;   DILATION AND EVACUATION N/A 08/31/2013   Procedure: DILATATION AND EVACUATION;  Surgeon: Adam Phenix, MD;  Location: [facility] ORS;  Service: Gynecology;  Laterality: N/A;   FISTULA SUPERFICIALIZATION Left 01/20/2019   Procedure: Fistula Superficialization;  Surgeon: Chuck Hint, MD;  Location[facility] OR;  Service: Vascular;  Laterality: Left;   HYDRADENITIS EXCISION Right    INSERTION OF DIALYSIS CATHETER Right 12/19/2021   Procedure: INSERTION OF A TUNNELED DIALYSIS CATHETER;  Surgeon: Maeola Harman, MD;  Location: [facility] OR;  Service: Vascular;  Laterality: Right;   RENAL  BIOPSY     REVISION OF ARTERIOVENOUS GORETEX GRAFT Left 02/11/2013   Procedure: REVISION OF ARTERIOVENOUS GORTEX FISTULA;  Surgeon: Larina Earthly, MD;  Location: [facility] OR;  Service: Vascular;  Laterality: Left;   THROMBECTOMY W/ EMBOLECTOMY Left 05/25/2018   Procedure: REPAIR OF BLEEDING ARTERIOVENOUS FISTULA;  Surgeon: Nada Libman, MD;  Location: [facility] OR;  Service: Vascular;  Laterality: Left;     Medications Prior to Admission: Prior to Admission medications   Medication Sig Start Date End Date Taking? Authorizing Provider  albuterol (PROVENTIL HFA;VENTOLIN HFA) 108 (90  Base) MCG/ACT inhaler Inhale 2 puffs into the lungs every 4 (four) hours as needed for wheezing or shortness of breath. Patient not taking: Reported on 04/30/2022 05/02/16   Lavera Guise, MD  ALPRAZolam Prudy Feeler) 1 MG tablet Take 1 mg by mouth 3 (three) times daily as needed. 11/25/21   [provider]  amoxicillin-clavulanate (AUGMENTIN) 875-125 MG tablet Take 1 tablet by mouth 2 (two) times daily. 04/30/22   Oretha Milch, MD  aspirin EC 81 MG tablet Take 81 mg by mouth daily. Swallow whole.    [provider]  calcium acetate (PHOSLO) 667 MG capsule Take 667-1,334 mg by mouth See admin instructions. 3 capsules with meals  1 to 2 capsules with snacks 11/30/17   [provider]  cinacalcet (SENSIPAR) 60 MG tablet Take 60 mg by mouth every evening.    [provider]  cyclobenzaprine (FLEXERIL) 10 MG tablet Take 10 mg by mouth 2 (two) times daily as needed for muscle spasms.    [provider]  fluticasone furoate-vilanterol (BREO ELLIPTA) 100-25 MCG/ACT AEPB Inhale 1 puff into the lungs daily. 04/30/22   Oretha Milch, MD  lactulose (CHRONULAC) 10 GM/15ML solution Take 30 mLs by mouth daily as needed for mild constipation, moderate constipation or severe constipation. Patient not taking: Reported on 04/30/2022 09/04/21   [provider]  NARCAN 4 MG/0.1ML LIQD nasal spray kit Place 1 spray into the nose as needed (accidental overdose). Patient not taking: Reported on 04/30/2022 11/14/18   [provider]  ondansetron (ZOFRAN-ODT) 8 MG disintegrating tablet Take 8 mg by mouth every 8 (eight) hours as needed for nausea or vomiting.     [provider]  Oxycodone HCl 10 MG TABS Take 1 tablet (10 mg total) by mouth 3 (three) times daily as needed (severe pain). 12/22/21   Arrien, York Ram, MD  pantoprazole (PROTONIX) 20 MG tablet Take 20 mg by mouth daily. 11/25/21   [provider]  sevelamer carbonate (RENVELA) 800 MG tablet  Take 1,600 mg by mouth 3 (three) times daily. 05/18/19   [provider]     Allergies:    Allergies  Allergen Reactions   Morphine Shortness Of Breath and Anaphylaxis   Prednisone Other (See Comments)    Other reaction(s): Other (See Comments) Muscle spasms Patient says prednisone causes her to cramp all over, muscle spasms uncontrolled   Tuna [Fish Allergy] Itching, Swelling, Rash and Other (See Comments)    Face droops also   Amlodipine Other (See Comments)    Angioedema (09/05/17 ED visit)   Iodinated Contrast Media Itching   Tape Itching    Adhesive tape   paper tape ok    Social History:   Social History   Socioeconomic History   Marital status: Married    Spouse name: Not on file   Number of children: Not on file   Years of education: Not on file  Highest education level: Not on file  Occupational History   Not on file  Tobacco Use   Smoking status: Never    Passive exposure: Yes   Smokeless tobacco: Never   Tobacco comments:    Husband and daughter Smokes on patio  Vaping Use   Vaping Use: Never used  Substance and Sexual Activity   Alcohol use: No   Drug use: Yes    Types: Marijuana    Comment: not recent   Sexual activity: Yes    Birth control/protection: None  Other Topics Concern   Not on file  Social History Narrative   Not on file   Social Determinants of Health   Financial Resource Strain: Not on file  Food Insecurity: Not on file  Transportation Needs: Not on file  Physical Activity: Not on file  Stress: Not on file  Social Connections: Not on file  Intimate Partner Violence: Not on file    Family History:  The patient's family history includes Diabetes in her mother; Diabetes type II in an other family member; Heart disease in her mother; Hyperlipidemia in her mother; Hypertension in her mother and another family member.    ROS:  Please see the history of present illness.  All other ROS reviewed and negative.     Physical  Exam/Data:   Vitals:   09/12/22 2328 09/12/22 2336 09/12/22 2337 09/12/22 2341  BP: (!) 152/89   130/78  Pulse: 84   83  Resp: (!) 26   (!) 21  Temp:  97.8 F (36.6 C)    TempSrc:  Oral    SpO2: (!) 3%   100%  Weight:   74 kg   Height:   5\' 4"  (1.626 m)    No intake or output data in the 24 hours ending 09/13/22 0002    09/12/2022   11:37 PM 04/30/2022    9:39 AM 02/11/2022    1:39 PM  Last 3 Weights  Weight (lbs) 163 lb 2.3 oz 185 lb 191 lb  Weight (kg) 74 kg 83.915 kg 86.637 kg     Body mass index is 28 kg/m.  General:  in severe distress. Acutely ill HEENT: normal Neck: no JVD Vascular: No carotid bruits; Distal pulses 2+ bilaterally   Cardiac:  normal S1, S2; RRR; no murmur  Lungs: Crackles at bases Abd: soft, nontender, no hepatomegaly  Ext: no edema Musculoskeletal:  No deformities, BUE and BLE strength normal and equal Skin: warm and dry  Neuro:  CNs 2-12 intact, no focal abnormalities noted Psych:  Normal affect    EKG:  The ECG that was done  was personally reviewed and demonstrates acute anterior STEMI  Laboratory Data:  High Sensitivity Troponin:  No results for input(s): "TROPONINIHS" in the last 720 hours.    Chemistry Recent Labs  Lab 09/12/22 2330  NA 138  K 4.1  CL 99  GLUCOSE 102*  BUN 38*  CREATININE 6.20*    No results for input(s): "PROT", "ALBUMIN", "AST", "ALT", "ALKPHOS", "BILITOT" in the last 168 hours. Lipids No results for input(s): "CHOL", "TRIG", "HDL", "LABVLDL", "LDLCALC", "CHOLHDL" in the last 168 hours. Hematology Recent Labs  Lab 09/12/22 2330  WBC 5.4  RBC 4.66  HGB 12.2  14.3  HCT 40.0  42.0  MCV 85.8  MCH 26.2  MCHC 30.5  RDW 18.3*  PLT 145*   Thyroid No results for input(s): "TSH", "FREET4" in the last 168 hours. BNPNo results for input(s): "BNP", "PROBNP" in the last  168 hours.  DDimer No results for input(s): "DDIMER" in the last 168 hours.   Radiology/Studies:  No results found.   Assessment and  Plan:   # Acute Anterior STEMI # ESRD # HTN # Chronix Hypoxic RF # Chronic pain  -Patient with CP and STEMI.  -Emergent Cath lab -Echo in AM. Bedside echo showed no effusion -Load aspirin -DAPT -Atorvastatin 80 mg -IV heparin -Metoprolol 25 mg BID -Renal consult for ESRD and dialysis -Allergic to contrast so will prep -Telemetry   For questions or updates, please contact  [facility] Please consult [email or web address] for contact info under     Signed, Hermelinda Dellen, MD  09/13/2022 12:02 AM    I have examined the patient and reviewed assessment and plan and discussed with patient.  Agree with above as stated.    I personally reviewed the ECG and made the decision for emergent cardiac cath.   Cath revealed: "  Lat 3rd Mrg lesion is 100% stenosed.  Balloon angioplasty was performed using a BALLN EMERGE MR 2.0X12.   Post intervention, there is a 0% residual stenosis.   Dist LAD lesion is 100% stenosed.  Balloon angioplasty was performed using a BALLN EMERGE MR 2.0X12.   Post intervention, there is a 100% residual stenosis.   Mid Cx lesion is 50% stenosed.  Thrombotic.   LV end diastolic pressure is moderately elevated.   Recommend to resume Apixaban, at currently prescribed dose and frequency.   Recommend concurrent antiplatelet therapy of Clopidogrel 75mg  daily for 12 months.   No atherosclerotic coronary artery disease.  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  Angioplasty done in both areas but neither area receiving TIMI-3 flow.  Resume heparin 6 hours after femoral sheath removal.  She will need echocardiogram to look for source of embolism.  Would consider long-term oral anticoagulation given her presentation here today.  Could also use clopidogrel for antiplatelet therapy depending on whether or not she has bleeding issues.  Will watch in 2 heart."   Lance Muss

## 2022-09-13 NOTE — Progress Notes (Signed)
VASCULAR LAB    Bilateral lower extremity venous duplex has been performed.  See CV proc for preliminary results.   Katie Moch, RVT 09/13/2022, 10:35 AM

## 2022-09-13 NOTE — Progress Notes (Signed)
ANTICOAGULATION CONSULT NOTE  Pharmacy Consult for Heparin  Indication: chest pain/ACS, s/p cath   Allergies  Allergen Reactions   Morphine Shortness Of Breath and Anaphylaxis   Prednisone Other (See Comments)    Other reaction(s): Other (See Comments) Muscle spasms Patient says prednisone causes her to cramp all over, muscle spasms uncontrolled   Tuna [Fish Allergy] Itching, Swelling, Rash and Other (See Comments)    Face droops also   Amlodipine Other (See Comments)    Angioedema (09/05/17 ED visit)   Iodinated Contrast Media Itching   Tape Itching    Adhesive tape   paper tape ok    Patient Measurements: Height: 5\' 4"  (162.6 cm) Weight: 76.3 kg (168 lb 3.4 oz) IBW/kg (Calculated) : 54.7 Heparin dosing weight: 70 kg  Vital Signs: Temp: 98.4 F (36.9 C) (06/30 1602) Temp Source: Oral (06/30 1602) BP: 113/64 (06/30 1800) Pulse Rate: 84 (06/30 1800)  Labs: Recent Labs    09/12/22 2330 09/13/22 0141 09/13/22 1847  HGB 12.2  14.3 12.0  --   HCT 40.0  42.0 39.0  --   PLT 145* 138*  --   LABPROT 15.1  --   --   INR 1.2  --   --   HEPARINUNFRC  --   --  0.24*  CREATININE 6.06*  6.20* 6.11*  --   TROPONINIHS 40*  --   --      Estimated Creatinine Clearance: 11.5 mL/min (A) (by C-G formula based on SCr of 6.11 mg/dL (H)).   Medical History: Past Medical History:  Diagnosis Date   Anemia    Anxiety    panic attacks   Arthritis    bilateral knees   Childhood asthma    Complication of anesthesia    "sometimes it does not work; didn't during LEEP OR" (01/21/2016)   Depression    no med   ESRD (end stage renal disease) on dialysis (HCC)    "TTS; [facility]; [facility]" (08/03/2016)   GERD (gastroesophageal reflux disease)    nexium prn   Gout    History of blood transfusion    "related to kidneys; I've had 4" (01/21/2016)   Hypertension    Migraine    last one 01/18/19   Preterm labor ~ 2014   Retina hole, left    Seizures (HCC)    "last one was  in 2000; related to preeclampsia" (01/21/2016)     Assessment: 47 y/o F CODE STEMI s/p cath, no atherosclerotic disease, occlusions appeared thrombotic per MD, Heparin per pharmacy.  Heparin level 0.24 units/mL (subtherapeutic) on heparin 900 units/hr. No signs of bleeding reported.  Goal of Therapy:  Heparin level 0.3-0.7 units/ml Monitor platelets by anticoagulation protocol: Yes   Plan:  Increase heparin to 1050 units/hr Check 6hr heparin level Monitor for bleeding  Eldridge Scot, PharmD Clinical Pharmacist

## 2022-09-13 NOTE — Consult Note (Signed)
Renal Service Consult Note [facility]  Desiree [name] 09/13/2022 Desiree Krabbe, MD Requesting Physician: Dr. Eldridge Dace  Reason for Consult: ESRD pt w/ chest pain  HPI: The patient is a 47 y.o. year-old w/ PMH as below who presented to ED overnight last night w/ acute onset of chest pain. She had had her dialysis earlier in the day. She wears home O2 at 3 L [place]. EKG showed anterior ST changes and code STEMI was activated. Bedside echo showed no effusion. Pt was seen by cardiology and went to cath lab. A distal 100% lesion was intervened upon w/o success. The 3rd marginal off the LCx was 100% stenosed and it was opened up w/ angioplasty. There was a mid LCx 50% lesion. Pt also has new atrial fib and cardiology notes suggest she may have suffered an embolic event to the coronary arteries. Imdur was added and morphine, also asa/ plavix and IV heparin for afib. Metoprolol was added. Hydralazine 25 tid was added for HTN. Pt was admitted. We are asked to see for dialysis.   Pt had dialysis yesterday 6/29 prior to the events mentioned above. Pt denies any recent HD issues. She has been "losing body wt" unintentionally and they have reducing her dry "every week" per the patient.  Our OP HD app is down today so I can't get more info about her OP HD.   ROS - denies CP, no joint pain, no HA, no blurry vision, no rash, no diarrhea, no nausea/ vomiting, no dysuria, no difficulty voiding   Past Medical History  Past Medical History:  Diagnosis Date   Anemia    Anxiety    panic attacks   Arthritis    bilateral knees   Childhood asthma    Complication of anesthesia    "sometimes it does not work; didn't during LEEP OR" (01/21/2016)   Depression    no med   ESRD (end stage renal disease) on dialysis (HCC)    "TTS; [facility]; [facility]" (08/03/2016)   GERD (gastroesophageal reflux disease)    nexium prn   Gout    History of blood transfusion    "related to kidneys; I've had 4"  (01/21/2016)   Hypertension    Migraine    last one 01/18/19   Preterm labor ~ 2014   Retina hole, left    Seizures (HCC)    "last one was in 2000; related to preeclampsia" (01/21/2016)   Past Surgical History  Past Surgical History:  Procedure Laterality Date   A/V FISTULAGRAM Left 12/19/2021   Procedure: A/V Fistulagram;  Surgeon: Chuck Hint, MD;  Location: [facility] INVASIVE CV LAB;  Service: Cardiovascular;  Laterality: Left;   AV FISTULA PLACEMENT Left 2010   AV FISTULA PLACEMENT Left 01/20/2019   Procedure: ARTERIOVENOUS (AV) FISTULA CREATION LEFT UPPER ARM;  Surgeon: Chuck Hint, MD;  Location: [facility] OR;  Service: Vascular;  Laterality: Left;   AV FISTULA PLACEMENT Left 12/19/2021   Procedure: ARTERIOVENOUS (AV) FISTULA CREATION POSSIBLE TUNNELED DIALYSIS;  Surgeon: Maeola Harman, MD;  Location: [facility] OR;  Service: Vascular;  Laterality: Left;   CERVICAL BIOPSY  W/ LOOP ELECTRODE EXCISION  2001   DILATION AND EVACUATION  08/02/2011   Procedure: DILATATION AND EVACUATION;  Surgeon: Oliver Pila, MD;  Location: [facility] ORS;  Service: Gynecology;;   DILATION AND EVACUATION N/A 08/31/2013   Procedure: DILATATION AND EVACUATION;  Surgeon: Adam Phenix, MD;  Location: [facility] ORS;  Service: Gynecology;  Laterality: N/A;  FISTULA SUPERFICIALIZATION Left 01/20/2019   Procedure: Fistula Superficialization;  Surgeon: Chuck Hint, MD;  Location: [facility] OR;  Service: Vascular;  Laterality: Left;   HYDRADENITIS EXCISION Right    INSERTION OF DIALYSIS CATHETER Right 12/19/2021   Procedure: INSERTION OF A TUNNELED DIALYSIS CATHETER;  Surgeon: Maeola Harman, MD;  Location: [facility] OR;  Service: Vascular;  Laterality: Right;   RENAL BIOPSY     REVISION OF ARTERIOVENOUS GORETEX GRAFT Left 02/11/2013   Procedure: REVISION OF ARTERIOVENOUS GORTEX FISTULA;  Surgeon: Larina Earthly, MD;  Location: [facility] OR;  Service: Vascular;  Laterality: Left;   THROMBECTOMY W/ EMBOLECTOMY Left  05/25/2018   Procedure: REPAIR OF BLEEDING ARTERIOVENOUS FISTULA;  Surgeon: Nada Libman, MD;  Location: [facility] OR;  Service: Vascular;  Laterality: Left;   Family History  Family History  Problem Relation Age of Onset   Diabetes Mother    Hyperlipidemia Mother    Hypertension Mother    Heart disease Mother    Hypertension Other    Diabetes type II Other    Social History  reports that she has never smoked. She has been exposed to tobacco smoke. She has never used smokeless tobacco. She reports current drug use. Drug: Marijuana. She reports that she does not drink alcohol. Allergies  Allergies  Allergen Reactions   Morphine Shortness Of Breath and Anaphylaxis   Prednisone Other (See Comments)    Other reaction(s): Other (See Comments) Muscle spasms Patient says prednisone causes her to cramp all over, muscle spasms uncontrolled   Tuna [Fish Allergy] Itching, Swelling, Rash and Other (See Comments)    Face droops also   Amlodipine Other (See Comments)    Angioedema (09/05/17 ED visit)   Iodinated Contrast Media Itching   Tape Itching    Adhesive tape   paper tape ok   Home medications Prior to Admission medications   Medication Sig Start Date End Date Taking? Authorizing Provider  albuterol (PROVENTIL HFA;VENTOLIN HFA) 108 (90 Base) MCG/ACT inhaler Inhale 2 puffs into the lungs every 4 (four) hours as needed for wheezing or shortness of breath. 05/02/16  Yes Lavera Guise, MD  ALPRAZolam Prudy Feeler) 1 MG tablet Take 1 mg by mouth 3 (three) times daily as needed. 11/25/21  Yes [provider]  calcium acetate (PHOSLO) 667 MG capsule Take 667-1,334 mg by mouth See admin instructions. 3 capsules with meals  1 to 2 capsules with snacks 11/30/17  Yes [provider]  cinacalcet (SENSIPAR) 60 MG tablet Take 60 mg by mouth every evening.   Yes [provider]  fluticasone furoate-vilanterol (BREO ELLIPTA) 100-25 MCG/ACT AEPB Inhale 1 puff into the lungs daily. 04/30/22   Yes Oretha Milch, MD  Oxycodone HCl 10 MG TABS Take 1 tablet (10 mg total) by mouth 3 (three) times daily as needed (severe pain). 12/22/21  Yes Arrien, York Ram, MD  sevelamer carbonate (RENVELA) 800 MG tablet Take 1,600 mg by mouth 3 (three) times daily. 05/18/19  Yes [provider]  amoxicillin-clavulanate (AUGMENTIN) 875-125 MG tablet Take 1 tablet by mouth 2 (two) times daily. 04/30/22   Oretha Milch, MD  aspirin EC 81 MG tablet Take 81 mg by mouth daily. Swallow whole.    [provider]  cyclobenzaprine (FLEXERIL) 10 MG tablet Take 10 mg by mouth 2 (two) times daily as needed for muscle spasms.    [provider]  lactulose (CHRONULAC) 10 GM/15ML solution Take 30 mLs by mouth daily as needed for mild constipation,  moderate constipation or severe constipation. Patient not taking: Reported on 04/30/2022 09/04/21   [provider]  NARCAN 4 MG/0.1ML LIQD nasal spray kit Place 1 spray into the nose as needed (accidental overdose). Patient not taking: Reported on 04/30/2022 11/14/18   [provider]  ondansetron (ZOFRAN-ODT) 8 MG disintegrating tablet Take 8 mg by mouth every 8 (eight) hours as needed for nausea or vomiting.     [provider]  pantoprazole (PROTONIX) 20 MG tablet Take 20 mg by mouth daily. 11/25/21   [provider]     Vitals:   09/13/22 1330 09/13/22 1345 09/13/22 1400 09/13/22 1420  BP: 128/77 128/81 (!) 129/91 126/77  Pulse: 87 95    Resp: (!) 24 (!) 29 (!) 25   Temp:      TempSrc:      SpO2: 100% 100%    Weight:      Height:       Exam Gen alert, no distress, nasal O2 No rash, cyanosis or gangrene Sclera anicteric, throat clear  +JVD to angle of jaw at 45 deg Chest clear bilat to bases, no rales/ wheezing RRR no RG Abd soft ntnd no mass or ascites +bs GU defer MS no joint effusions or deformity Ext no LE or UE edema, no wounds or ulcers Neuro is alert, Ox 3 , nf    LUE AVF+bruit    Home meds include - albuterol, xanax, phoslo 3 ac tid, sensipar 60 hs, breo ellipta, oxy IR prn, renvela 2 ac tid, asa, lacutlose prn, narcan prn, protonix, prns/ vits/ supps   OP HD: TTS SW   3h  400/600  74.8kg  2/2 bath   heparin 6600  LUE AVF - hectorol 4 mcg IV tiw  Assessment/ Plan: STEMI - sp LHC w/ distal LAD and distal 3rd marg LCx, sp PTA. Per cardiology, possible embolism to coronaries due to new afib.  New atrial fib - on IV heparin  Acute/ chronic hypoxic resp failure - is on 3 L [place] at home. Was on 5 L overnight and down to 4 L today. Admit CXR w/ borderline early edema. +c/o orthopnea today. Exam Lorayne Bender Myriam Jacobson, min edema) and LVEDP at cath was "moderately elevated". Plan extra HD tonight for volume overload/ orthopnea/ AHRF.  ESRD - on HD TTS. Last OP HD 6/29. Plan extra HD tonite as above.  HTN/ volume - BP's are wnl, vol as above.  Anemia esrd - Hb 12-14, follow.  MBD ckd - CCa in range, add on phos. Cont sensipar, IV vdra and binders.       Vinson Moselle  MD [facility] 09/13/2022, 2:57 PM  Recent Labs  Lab 09/12/22 2330 09/13/22 0141  HGB 12.2  14.3 12.0  ALBUMIN 3.5  --   CALCIUM 9.3 8.9  CREATININE 6.06*  6.20* 6.11*  K 4.1  4.1 3.3*   Inpatient medications:  aspirin EC  81 mg Oral Daily   Chlorhexidine Gluconate Cloth  6 each Topical Daily   [START ON 09/14/2022] clopidogrel  75 mg Oral Q breakfast   feeding supplement (NEPRO CARB STEADY)  237 mL Oral BID BM   ferric citrate  210 mg Oral TID WC   fluticasone furoate-vilanterol  1 puff Inhalation Daily   hydrALAZINE  25 mg Oral Q8H   isosorbide mononitrate  30 mg Oral Daily   metoprolol tartrate  25 mg Oral BID   pantoprazole  20 mg Oral Daily   sevelamer carbonate  1,600 mg Oral TID  WC   sodium chloride flush  3 mL Intravenous Q12H    sodium chloride 10 mL/hr at 09/13/22 1000   heparin 900 Units/hr (09/13/22 1059)   sodium chloride, acetaminophen, ALPRAZolam, cyclobenzaprine, hydrALAZINE, HYDROmorphone  (DILAUDID) injection, ipratropium-albuterol, lactulose, nitroGLYCERIN, ondansetron, oxyCODONE, sodium chloride flush

## 2022-09-14 ENCOUNTER — Encounter (HOSPITAL_COMMUNITY): Payer: Self-pay | Admitting: Interventional Cardiology

## 2022-09-14 ENCOUNTER — Other Ambulatory Visit (HOSPITAL_COMMUNITY): Payer: Self-pay

## 2022-09-14 DIAGNOSIS — I255 Ischemic cardiomyopathy: Secondary | ICD-10-CM | POA: Diagnosis not present

## 2022-09-14 DIAGNOSIS — I2109 ST elevation (STEMI) myocardial infarction involving other coronary artery of anterior wall: Secondary | ICD-10-CM | POA: Diagnosis not present

## 2022-09-14 LAB — HEPARIN LEVEL (UNFRACTIONATED)
Heparin Unfractionated: 0.32 IU/mL (ref 0.30–0.70)
Heparin Unfractionated: 0.28 IU/mL — ABNORMAL LOW (ref 0.30–0.70)

## 2022-09-14 LAB — LIPID PANEL
Cholesterol: 107 mg/dL (ref 0–200)
HDL: 36 mg/dL — ABNORMAL LOW (ref >40)
LDL Cholesterol: 64 mg/dL (ref 0–99)
Total CHOL/HDL Ratio: 3 RATIO
Triglycerides: 35 mg/dL (ref <150)
VLDL: 7 mg/dL (ref 0–40)

## 2022-09-14 LAB — BASIC METABOLIC PANEL
Anion gap: 16 — ABNORMAL HIGH (ref 5–15)
BUN: 55 mg/dL — ABNORMAL HIGH (ref 6–20)
CO2: 25 mmol/L (ref 22–32)
Calcium: 9.2 mg/dL (ref 8.9–10.3)
Chloride: 92 mmol/L — ABNORMAL LOW (ref 98–111)
Creatinine, Ser: 8.09 mg/dL — ABNORMAL HIGH (ref 0.44–1.00)
GFR, Estimated: 6 mL/min — ABNORMAL LOW (ref >60)
Glucose, Bld: 117 mg/dL — ABNORMAL HIGH (ref 70–99)
Potassium: 4.5 mmol/L (ref 3.5–5.1)
Sodium: 133 mmol/L — ABNORMAL LOW (ref 135–145)

## 2022-09-14 LAB — CBC
HCT: 38.1 % (ref 36.0–46.0)
Hemoglobin: 11.9 g/dL — ABNORMAL LOW (ref 12.0–15.0)
MCH: 26.2 pg (ref 26.0–34.0)
MCHC: 31.2 g/dL (ref 30.0–36.0)
MCV: 83.7 fL (ref 80.0–100.0)
Platelets: 147 10*3/uL — ABNORMAL LOW (ref 150–400)
RBC: 4.55 MIL/uL (ref 3.87–5.11)
RDW: 18.6 % — ABNORMAL HIGH (ref 11.5–15.5)
WBC: 7.8 10*3/uL (ref 4.0–10.5)
nRBC: 0 % (ref 0.0–0.2)

## 2022-09-14 LAB — POCT ACTIVATED CLOTTING TIME: Activated Clotting Time: 299 seconds

## 2022-09-14 LAB — TSH: TSH: 0.954 u[IU]/mL (ref 0.350–4.500)

## 2022-09-14 LAB — BRAIN NATRIURETIC PEPTIDE: B Natriuretic Peptide: 1704.8 pg/mL — ABNORMAL HIGH (ref 0.0–100.0)

## 2022-09-14 MED ORDER — DIPHENHYDRAMINE HCL 25 MG PO CAPS
25.0000 mg | ORAL_CAPSULE | Freq: Four times a day (QID) | ORAL | Status: DC | PRN
  Administered 2022-09-14 – 2022-09-17 (×5): 25 mg via ORAL
  Filled 2022-09-14 (×5): qty 1

## 2022-09-14 MED ORDER — ATORVASTATIN CALCIUM 80 MG PO TABS
80.0000 mg | ORAL_TABLET | Freq: Every day | ORAL | Status: DC
  Administered 2022-09-14 – 2022-09-17 (×4): 80 mg via ORAL
  Filled 2022-09-14 (×4): qty 1

## 2022-09-14 MED ORDER — CHLORHEXIDINE GLUCONATE CLOTH 2 % EX PADS
6.0000 | MEDICATED_PAD | Freq: Every day | CUTANEOUS | Status: DC
  Administered 2022-09-14 – 2022-09-15 (×2): 6 via TOPICAL

## 2022-09-14 NOTE — Progress Notes (Signed)
[facility] NEPHROLOGY PROGRESS NOTE  Assessment/ Plan: Pt is a 47 y.o. yo female with ESRD on HD, chronic hypoxic respiratory failure, hypertension presented on 09/13/2022 for anterior STEMI.  OP HD: TTS SW   3h  400/600  74.8kg  2/2 bath   heparin 6600  LUE AVF - hectorol 4 mcg IV tiw  # Anterior wall STEMI; sp LHC w/ distal LAD and distal 3rd marg LCx, sp PTA.  Per cardiology.  # ESRD TTS, status post HD last night with 3 L UF, tolerated well.  Plan for next HD tomorrow.  # Acute on chronic hypoxic respiratory failure: UF with HD, using oxygen via nasal cannula.  # Anemia of CKD: Hemoglobin at goal.  # CKD-MBD/secondary hyperparathyroidism/hyperphosphatemia: Noted he is on both Auryxia and sevelamer.  Monitor serum Phos level.  Also on Sensipar.  # HTN/volume: BP soft.  On hydralazine and metoprolol per cardiology.  UF with HD.  Subjective: Seen and examined in ICU.  Breathing much better.  No chest pain.  No nausea or vomiting.  Completed HD early this morning. Objective Vital signs in last 24 hours: Vitals:   09/14/22 0715 09/14/22 0730 09/14/22 0801 09/14/22 0822  BP: 111/81 116/81  112/79  Pulse: (!) 105 (!) 136  (!) 104  Resp: (!) 26 (!) 22  (!) 22  Temp:   97.6 F (36.4 C)   TempSrc:   Oral   SpO2: 100%  92% (!) 85%  Weight:      Height:       Weight change:   Intake/Output Summary (Last 24 hours) at 09/14/2022 1035 Last data filed at 09/14/2022 1610 Gross per 24 hour  Intake 1017.17 ml  Output 3000 ml  Net -1982.83 ml       Labs: RENAL PANEL Recent Labs  Lab 09/12/22 2330 09/13/22 0141 09/14/22 0243  NA 139  138 137 133*  K 4.1  4.1 3.3* 4.5  CL 95*  99 96* 92*  CO2 25 27 25   GLUCOSE 104*  102* 133* 117*  BUN 30*  38* 30* 55*  CREATININE 6.06*  6.20* 6.11* 8.09*  CALCIUM 9.3 8.9 9.2  PHOS  --  6.6*  --   ALBUMIN 3.5  --   --     Liver Function Tests: Recent Labs  Lab 09/12/22 2330  AST 26  ALT 22  ALKPHOS 77   BILITOT 0.6  PROT 8.6*  ALBUMIN 3.5   No results for input(s): "LIPASE", "AMYLASE" in the last 168 hours. No results for input(s): "AMMONIA" in the last 168 hours. CBC: Recent Labs    12/05/21 0917 12/06/21 0654 02/04/22 0213 03/31/22 1750 09/12/22 2330 09/13/22 0141 09/14/22 0243  HGB 7.8*   < > 8.6* 10.1* 12.2  14.3 12.0 11.9*  MCV 88.7   < > 86.8 79.7* 85.8 84.2 83.7  TIBC 263  --   --   --   --   --   --   IRON 33  --   --   --   --   --   --    < > = values in this interval not displayed.    Cardiac Enzymes: No results for input(s): "CKTOTAL", "CKMB", "CKMBINDEX", "TROPONINI" in the last 168 hours. CBG: Recent Labs  Lab 09/13/22 0117  GLUCAP 109*    Iron Studies: No results for input(s): "IRON", "TIBC", "TRANSFERRIN", "FERRITIN" in the last 72 hours. Studies/Results: ECHOCARDIOGRAM COMPLETE BUBBLE STUDY  Result Date: 09/13/2022  ECHOCARDIOGRAM REPORT   Patient Name:   CHAUNTA HERREL Date of Exam: 09/13/2022 Medical Rec #:  [id number]       Height:       64.0 in Accession #:    [id number]      Weight:       168.2 lb Date of Birth:  [birth year]        BSA:          1.818 m Patient Age:    47 years        BP:           126/85 mmHg Patient Gender: F               HR:           86 bpm. Exam Location:  Inpatient Procedure: 2D Echo, Color Doppler, Cardiac Doppler and Saline Contrast Bubble            Study Indications:    acute myocardial infarction  History:        Patient has prior history of Echocardiogram examinations, most                 recent 01/05/2022. CHF, End stage renal disease,                 Arrythmias:Atrial Fibrillation, Signs/Symptoms:Chest Pain; Risk                 Factors:Hypertension and cocaine use.  Sonographer:    Delcie Roch RDCS Referring Phys: [id number] JAYADEEP S VARANASI IMPRESSIONS  1. Left ventricular ejection fraction, by estimation, is 35 to 40%. The left ventricle has moderately decreased function. The left ventricle demonstrates regional wall  motion abnormalities (see scoring diagram/findings for description). Left ventricular  diastolic parameters are indeterminate.  2. Right ventricular systolic function is normal. The right ventricular size is normal. There is severely elevated pulmonary artery systolic pressure.  3. A small pericardial effusion is present. The pericardial effusion is circumferential.  4. The mitral valve is abnormal. Mild mitral valve regurgitation. No evidence of mitral stenosis. Severe mitral annular calcification (MAC). There is a mobile echodensity measuring 1.7 x 0.45 cm that is attached to the atrial surface of the posterior mitral valve annulus. Seen in prior echo images. Differential diagnosis include mobile MAC, calcified thrombus, calcified vegetation. 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 Clinical correlation is recommended. Consider TEE for further evaluation.  5. The tricuspid valve is abnormal. Tricuspid valve regurgitation is moderate to severe.  6. The aortic valve was not well visualized. Aortic valve regurgitation is not visualized. No aortic stenosis is present.  7. The inferior vena cava is dilated in size with <50% respiratory variability, suggesting right atrial pressure of 15 mmHg.  8. Agitated saline contrast bubble study was negative, with no evidence of any interatrial shunt. Comparison(s): Changes from prior study are noted. LVEF worsened to 35-40% with RWMA. FINDINGS  Left Ventricle: Left ventricular ejection fraction, by estimation, is 35 to 40%. The left ventricle has moderately decreased function. The left ventricle demonstrates regional wall motion abnormalities. The left ventricular internal cavity size was normal in size. There is no left ventricular hypertrophy. Left ventricular diastolic parameters are indeterminate.  LV Wall Scoring: The apical lateral segment, apical inferior segment, and apex are akinetic. The entire anterior wall, mid and distal anterior septum, inferior septum, and posterior wall  are hypokinetic. The antero-lateral wall, inferior wall, and basal anteroseptal segment are normal. Right Ventricle: The right ventricular size is  normal. No increase in right ventricular wall thickness. Right ventricular systolic function is normal. There is severely elevated pulmonary artery systolic pressure. The tricuspid regurgitant velocity is 3.38 m/s, and with an assumed right atrial pressure of 15 mmHg, the estimated right ventricular systolic pressure is 60.7 mmHg. Left Atrium: Left atrial size was normal in size. Right Atrium: Right atrial size was normal in size. Pericardium: A small pericardial effusion is present. The pericardial effusion is circumferential. Mitral Valve: The mitral valve is abnormal. Severe mitral annular calcification. Mild mitral valve regurgitation. No evidence of mitral valve stenosis. MV peak gradient, 14.1 mmHg. The mean mitral valve gradient is 4.3 mmHg. Tricuspid Valve: The tricuspid valve is abnormal. Tricuspid valve regurgitation is moderate to severe. No evidence of tricuspid stenosis. Aortic Valve: The aortic valve was not well visualized. There is moderate aortic valve annular calcification. Aortic valve regurgitation is not visualized. No aortic stenosis is present. Pulmonic Valve: The pulmonic valve was normal in structure. Pulmonic valve regurgitation is mild. No evidence of pulmonic stenosis. Aorta: The aortic root and ascending aorta are structurally normal, with no evidence of dilitation. Venous: The inferior vena cava is dilated in size with less than 50% respiratory variability, suggesting right atrial pressure of 15 mmHg. IAS/Shunts: No atrial level shunt detected by color flow Doppler. Agitated saline contrast was given intravenously to evaluate for intracardiac shunting. Agitated saline contrast bubble study was negative, with no evidence of any interatrial shunt.  LEFT VENTRICLE PLAX 2D LVIDd:         4.70 cm LVIDs:         3.00 cm LV PW:         0.90 cm LV  IVS:        0.80 cm LVOT diam:     1.60 cm LV SV:         38 LV SV Index:   21 LVOT Area:     2.01 cm  RIGHT VENTRICLE             IVC RV Basal diam:  2.70 cm     IVC diam: 2.30 cm RV S prime:     11.70 cm/s TAPSE (M-mode): 1.3 cm LEFT ATRIUM             Index        RIGHT ATRIUM           Index LA diam:        4.10 cm 2.26 cm/m   RA Area:     13.50 cm LA Vol (A2C):   70.9 ml 39.01 ml/m  RA Volume:   29.10 ml  16.01 ml/m LA Vol (A4C):   51.0 ml 28.06 ml/m LA Biplane Vol: 61.3 ml 33.73 ml/m  AORTIC VALVE LVOT Vmax:   110.00 cm/s LVOT Vmean:  71.933 cm/s LVOT VTI:    0.190 m  AORTA Ao Root diam: 2.70 cm Ao Asc diam:  3.00 cm MITRAL VALVE             TRICUSPID VALVE MV Area VTI:  1.06 cm   TR Peak grad:   45.7 mmHg MV Peak grad: 14.1 mmHg  TR Vmax:        338.00 cm/s MV Mean grad: 4.3 mmHg MV Vmax:      1.88 m/s   SHUNTS MV Vmean:     89.1 cm/s  Systemic VTI:  0.19 m  Systemic Diam: 1.60 cm Vishnu Priya Mallipeddi Electronically signed by Winfield Rast Mallipeddi Signature Date/Time: 09/13/2022/7:35:41 PM    Final    VAS [place] LOWER EXTREMITY VENOUS (DVT)  Result Date: 09/13/2022  Lower Venous DVT Study Patient Name:  EMMALYNE OLIVERSON  Date of Exam:   09/13/2022 Medical Rec #: [id number]        Accession #:    [id number] Date of Birth: January 13, 1976         Patient Gender: F Patient Age:   35 years Exam Location:  [facility] Procedure:      VAS [place] LOWER EXTREMITY VENOUS (DVT) Referring Phys: Gerri Spore O'NEAL --------------------------------------------------------------------------------  Indications: Coronary embolism.  Limitations: Pain with compression, status post right femoral cath. Comparison Study: Prior negative bilateral LEV done 01/14/21 Performing Technologist: Sherren Kerns RVS  Examination Guidelines: A complete evaluation includes B-mode imaging, spectral Doppler, color Doppler, and power Doppler as needed of all accessible portions of each vessel. Bilateral testing is  considered an integral part of a complete examination. Limited examinations for reoccurring indications may be performed as noted. The reflux portion of the exam is performed with the patient in reverse Trendelenburg.  +---------+---------------+---------+-----------+---------------+--------------+ RIGHT    CompressibilityPhasicitySpontaneityProperties     Thrombus Aging +---------+---------------+---------+-----------+---------------+--------------+ CFV                     Yes      Yes        pulsatile      patent by                                                  waveforms      color and                                                                 Doppler        +---------+---------------+---------+-----------+---------------+--------------+ FV Prox  Full           Yes      Yes        pulsatile                                                                 waveforms                     +---------+---------------+---------+-----------+---------------+--------------+ FV Mid   Full                                                             +---------+---------------+---------+-----------+---------------+--------------+ FV DistalFull                                                             +---------+---------------+---------+-----------+---------------+--------------+  PFV      Full                                                             +---------+---------------+---------+-----------+---------------+--------------+ POP                     Yes      Yes        pulsatile      patent by                                                  waveforms      color and                                                                 Doppler        +---------+---------------+---------+-----------+---------------+--------------+ PTV      Full                                                              +---------+---------------+---------+-----------+---------------+--------------+ PERO     Full                                                             +---------+---------------+---------+-----------+---------------+--------------+   +---------+---------------+---------+-----------+---------------+--------------+ LEFT     CompressibilityPhasicitySpontaneityProperties     Thrombus Aging +---------+---------------+---------+-----------+---------------+--------------+ CFV                                         pulsatile      patent by                                                  waveforms      color and                                                                 Doppler        +---------+---------------+---------+-----------+---------------+--------------+ FV Prox  pulsatile      patent by                                                  waveforms      color and                                                                 Doppler        +---------+---------------+---------+-----------+---------------+--------------+ FV Mid                                      pulsatile      patent by                                                  waveforms      color and                                                                 Doppler        +---------+---------------+---------+-----------+---------------+--------------+ FV Distal                                   pulsatile      patent by                                                  waveforms      color and                                                                 Doppler        +---------+---------------+---------+-----------+---------------+--------------+ PFV                                         pulsatile      patent by                                                  waveforms      color and  Doppler        +---------+---------------+---------+-----------+---------------+--------------+ POP                                         pulsatile      patent by                                                  waveforms      color and                                                                 Doppler        +---------+---------------+---------+-----------+---------------+--------------+ PTV      Full                                                             +---------+---------------+---------+-----------+---------------+--------------+ PERO     Full                                                             +---------+---------------+---------+-----------+---------------+--------------+     Summary: BILATERAL: - No evidence of deep vein thrombosis seen in the lower extremities, bilaterally. -No evidence of popliteal cyst, bilaterally.   *See table(s) above for measurements and observations. Electronically signed by Coral Else MD on 09/13/2022 at 7:00:56 PM.    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  Balloon angioplasty was performed using a BALLN EMERGE MR 2.0X12.   Post intervention, there is a 0% residual stenosis.   Dist LAD lesion is 100% stenosed.  Balloon angioplasty was performed using a BALLN EMERGE MR 2.0X12.   Post intervention, there is a 100% residual stenosis.   Mid Cx lesion is 50% stenosed.  Thrombotic.   LV end diastolic pressure is moderately elevated.   Recommend to resume Apixaban, at currently prescribed dose and frequency.   Recommend concurrent antiplatelet therapy of Clopidogrel 75mg  daily for 12 months. No atherosclerotic coronary artery disease.  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  Angioplasty done in both areas but neither area  receiving TIMI-3 flow.  Resume heparin 6 hours after femoral sheath removal.  She will need echocardiogram to look for source of embolism.  Would consider long-term oral anticoagulation given her presentation here today.  Could also use clopidogrel for antiplatelet therapy depending on whether or not she has bleeding issues.  Will watch in 2 heart. I spoke to her daughter, Mel Almond, and conveyed the results.  Result Date: 09/13/2022   Lat 3rd Mrg lesion  is 100% stenosed.  Balloon angioplasty was performed using a BALLN EMERGE MR 2.0X12.   Post intervention, there is a 0% residual stenosis.   Dist LAD lesion is 100% stenosed.  Balloon angioplasty was performed using a BALLN EMERGE MR 2.0X12.   Post intervention, there is a 100% residual stenosis.   Mid Cx lesion is 50% stenosed.  Thrombotic.   LV end diastolic pressure is moderately elevated.   Recommend to resume Apixaban, at currently prescribed dose and frequency.   Recommend concurrent antiplatelet therapy of Clopidogrel 75mg  daily for 12 months. No atherosclerotic coronary artery disease.  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  Angioplasty done in both areas but neither area receiving TIMI-3 flow.  Resume heparin 6 hours after femoral sheath removal.  She will need echocardiogram to look for source of embolism.  Would consider long-term oral anticoagulation given her presentation here today.  Could also use clopidogrel for antiplatelet therapy depending on whether or not she has bleeding issues.  Will watch in 2 heart. I spoke to her daughter, Mel Almond, and conveyed the results.   DG Chest Port 1 View  Result Date: 09/13/2022 CLINICAL DATA:  Chest pain EXAM: PORTABLE CHEST 1 VIEW COMPARISON:  04/30/2022 FINDINGS: Cardiomegaly with mild perihilar edema. No definite pleural effusions. No pneumothorax. IMPRESSION: Cardiomegaly with mild perihilar edema. Electronically Signed   By:  Charline Bills M.D.   On: 09/13/2022 00:16    Medications: Infusions:  sodium chloride Stopped (09/13/22 1058)   heparin 1,050 Units/hr (09/14/22 0616)    Scheduled Medications:  aspirin EC  81 mg Oral Daily   atorvastatin  80 mg Oral Daily   Chlorhexidine Gluconate Cloth  6 each Topical Daily   cinacalcet  60 mg Oral Q supper   clopidogrel  75 mg Oral Q breakfast   [START ON 09/15/2022] doxercalciferol  4 mcg Intravenous Q T,Th,Sa-HD   feeding supplement (NEPRO CARB STEADY)  237 mL Oral BID BM   ferric citrate  210 mg Oral TID WC   fluticasone furoate-vilanterol  1 puff Inhalation Daily   hydrALAZINE  25 mg Oral Q8H   isosorbide mononitrate  30 mg Oral Daily   metoprolol tartrate  25 mg Oral BID   pantoprazole  20 mg Oral Daily   sevelamer carbonate  1,600 mg Oral TID WC   sodium chloride flush  3 mL Intravenous Q12H    have reviewed scheduled and prn medications.  Physical Exam: General:NAD, comfortable Heart:RRR, s1s2 nl Lungs:clear b/l, no crackle Abdomen:soft, Non-tender, non-distended Extremities:No edema Dialysis Access: Left upper extremity AV fistula has good thrill and bruit.  Dilyn Smiles Prasad Annaliza Zia 09/14/2022,10:35 AM  LOS: 1 day

## 2022-09-14 NOTE — TOC Initial Note (Addendum)
Transition of Care [facility]) - Initial/Assessment Note    Patient Details  Name: Desiree [name] MRN: [id number] Date of Birth: 10-Jul-1975  Transition of Care [facility]) CM/SW Contact:    Gala Lewandowsky, RN Phone Number: 09/14/2022, 3:48 PM  Clinical Narrative: Risk for readmission assessment completed. Patient presented for chest pain. Patient states she is from home with her dad and her spouse. Patient has oxygen via [employer] and is asking for smaller tanks. Case Manager reached out to [employer] and they can assist if the MD writes an order for pulsed oxygen conserving device or ML6 tank with pulse dosed. [employer] is following the patient. Case Manager discussed SNF and the patient has declined. Patient wants to return home with [facility] and she has no agency preference. Case Manager called [facility] and they can service the patient for RN/PT/OT-will need orders. Case Manager will continue to follow for additional needs as the patient progresses.              Expected Discharge Plan: Home w Home Health Services Barriers to Discharge: Continued Medical Work up   Patient Goals and CMS Choice Patient states their goals for this hospitalization and ongoing recovery are:: patient wants to return home and not SNF  Expected Discharge Plan and Services In-house Referral: NA Discharge Planning Services: CM Consult Post Acute Care Choice: Home Health                   DME Arranged:  (Pulsed oxygen conserving device.) DME Agency: [employer] Date DME Agency Contacted: 09/14/22 Time DME Agency Contacted: [phone] Representative spoke with at DME Agency: Zack HH Arranged: RN, Disease Management, PT, OT HH Agency: [employer] [facility] Date [facility] Agency Contacted: 09/14/22 Time HH Agency Contacted: [phone] Representative spoke with at [facility] Agency: Elnita Maxwell  Prior Living Arrangements/Services   Lives with:: Spouse, Parents Patient language and need for interpreter reviewed:: Yes Do you feel safe going back to  the place where you live?: Yes      Need for Family Participation in Patient Care: Yes (Comment) Care giver support system in place?: Yes (comment)   Criminal Activity/Legal Involvement Pertinent to Current Situation/Hospitalization: No - Comment as needed  Activities of Daily Living Home Assistive Devices/Equipment: Bedside commode/3-in-1, Built-in shower seat, Eyeglasses (lower bed, special recliner chair) ADL Screening (condition at time of admission) Patient's cognitive ability adequate to safely complete daily activities?: Yes Is the patient deaf or have difficulty hearing?: Yes Does the patient have difficulty seeing, even when wearing glasses/contacts?: Yes Does the patient have difficulty concentrating, remembering, or making decisions?: No Patient able to express need for assistance with ADLs?: Yes Does the patient have difficulty dressing or bathing?: Yes Independently performs ADLs?: No Communication: Independent Dressing (OT): Needs assistance Is this a change from baseline?: Pre-admission baseline Grooming: Independent Feeding: Independent Bathing: Needs assistance Is this a change from baseline?: Pre-admission baseline Toileting: Independent In/Out Bed: Independent Walks in Home: Independent Does the patient have difficulty walking or climbing stairs?: Yes Weakness of Legs: Both Weakness of Arms/Hands: Both  Permission Sought/Granted Permission sought to share information with : Family Supports, Magazine features editor, Case Estate manager/land agent granted to share information with : Yes, Verbal Permission Granted     Permission granted to share info w AGENCY: [facility]        Emotional Assessment Appearance:: Appears stated age Attitude/Demeanor/Rapport: Engaged Affect (typically observed): Appropriate Orientation: : Oriented to Self, Oriented to Place, Oriented to  Time, Oriented to Situation Alcohol / Substance Use:  Not Applicable Psych Involvement: No  (comment)  Admission diagnosis:  ST elevation myocardial infarction (STEMI), unspecified artery (HCC) [I21.3] Acute anterolateral myocardial infarction [facility]) [I21.09] Patient Active Problem List   Diagnosis Date Noted   Cardiomyopathy, ischemic 09/14/2022   Acute anterolateral myocardial infarction (HCC) 09/13/2022   ST elevation myocardial infarction (STEMI) (HCC) 09/13/2022   Persistent atrial fibrillation (HCC) 09/13/2022   Asthma, persistent not controlled 04/30/2022   CAP (community acquired pneumonia) due to Pneumococcus (HCC) 02/03/2022   Chronic respiratory failure with hypoxia (HCC) 02/03/2022   Anxiety and depression 02/03/2022   Gout 02/03/2022   HTN (hypertension) 02/03/2022   Community acquired pneumonia 02/03/2022   Volume overload 12/21/2021   Skin infection 12/20/2021   Class 1 obesity 12/20/2021   CHF (congestive heart failure) (HCC) 12/19/2021   Hypertensive urgency 12/05/2021   Prolonged QT interval 12/05/2021   Anxiety    Acute gastroenteritis 02/16/2021   GERD (gastroesophageal reflux disease) 01/11/2021   Hyponatremia 01/11/2021   Pulmonary edema 01/11/2021   ESRD (end stage renal disease) on dialysis (HCC) 06/02/2018   Fever 12/07/2017   Abdominal pain 08/03/2016   Chronic pain 01/21/2016   Muscle cramps 03/29/2015   Anemia of chronic kidney failure 09/15/2013   Hyperkalemia 08/29/2013   Cocaine abuse (HCC) 08/26/2013   Depression 04/21/2012   ESRD (end stage renal disease) (HCC) 08/03/2011   PCP:  Jearld Lesch, MD Pharmacy:   [id number] [place], [place] - [address] AT [facility] OF [facility] & [address] [place] [address] Phone: [phone] Fax: [phone]  [facility] [address] [place] [address] Phone: [phone] Fax: [phone]  Social Determinants of Health (SDOH) Social History: SDOH Screenings   Food Insecurity: No Food Insecurity  (09/13/2022)  Housing: Low Risk  (09/13/2022)  Transportation Needs: No Transportation Needs (09/13/2022)  Utilities: Not At Risk (09/13/2022)  Tobacco Use: Medium Risk (09/14/2022)   Readmission Risk Interventions    09/14/2022    3:42 PM  Readmission Risk Prevention Plan  Transportation Screening Complete  Medication Review (RN Care Manager) Complete  HRI or Home Care Consult Complete  SW Recovery Care/Counseling Consult Complete  Palliative Care Screening Not Applicable  Skilled Nursing Facility Not Applicable

## 2022-09-14 NOTE — Progress Notes (Signed)
ANTICOAGULATION CONSULT NOTE  Pharmacy Consult for Heparin  Indication: chest pain/ACS, s/p cath   Allergies  Allergen Reactions   Morphine Shortness Of Breath and Anaphylaxis   Prednisone Other (See Comments)    Other reaction(s): Other (See Comments) Muscle spasms Patient says prednisone causes her to cramp all over, muscle spasms uncontrolled   Tuna [Fish Allergy] Itching, Swelling, Rash and Other (See Comments)    Face droops also   Amlodipine Other (See Comments)    Angioedema (09/05/17 ED visit)   Iodinated Contrast Media Itching   Tape Itching    Adhesive tape   paper tape ok    Patient Measurements: Height: 5\' 4"  (162.6 cm) Weight: 76.3 kg (168 lb 3.4 oz) IBW/kg (Calculated) : 54.7 Heparin dosing weight: 70 kg  Vital Signs: Temp: 98.7 F (37.1 C) (07/01 0000) Temp Source: Oral (07/01 0000) BP: 104/71 (07/01 0200) Pulse Rate: 106 (07/01 0200)  Labs: Recent Labs    09/12/22 2330 09/13/22 0141 09/13/22 1847 09/14/22 0243  HGB 12.2  14.3 12.0  --  11.9*  HCT 40.0  42.0 39.0  --  38.1  PLT 145* 138*  --  147*  LABPROT 15.1  --   --   --   INR 1.2  --   --   --   HEPARINUNFRC  --   --  0.24* 0.32  CREATININE 6.06*  6.20* 6.11*  --   --   TROPONINIHS 40*  --   --   --      Estimated Creatinine Clearance: 11.5 mL/min (A) (by C-G formula based on SCr of 6.11 mg/dL (H)).   Medical History: Past Medical History:  Diagnosis Date   Anemia    Anxiety    panic attacks   Arthritis    bilateral knees   Childhood asthma    Complication of anesthesia    "sometimes it does not work; didn't during LEEP OR" (01/21/2016)   Depression    no med   ESRD (end stage renal disease) on dialysis (HCC)    "TTS; [facility]; [facility]" (08/03/2016)   GERD (gastroesophageal reflux disease)    nexium prn   Gout    History of blood transfusion    "related to kidneys; I've had 4" (01/21/2016)   Hypertension    Migraine    last one 01/18/19   Preterm labor ~ 2014    Retina hole, left    Seizures (HCC)    "last one was in 2000; related to preeclampsia" (01/21/2016)     Assessment: 47 y/o F CODE STEMI s/p cath, no atherosclerotic disease, occlusions appeared thrombotic per MD, Heparin per pharmacy.  Heparin level 0.32 units/mL (therapeutic) on heparin 1050 units/hr. No signs of bleeding reported.  Goal of Therapy:  Heparin level 0.3-0.7 units/ml Monitor platelets by anticoagulation protocol: Yes   Plan:  Continue heparin to 1050 units/hr Check 6hr confirmatory heparin level.  Monitor for bleeding  Estill Batten, PharmD, BCCCP  Clinical Pharmacist

## 2022-09-14 NOTE — Progress Notes (Signed)
Heart Failure Navigator Progress Note  Assessed for Heart & Vascular TOC clinic readiness.  Patient does not meet criteria due to ESRD on hemodialysis.   Navigator will sign off at this time.    Kyran Whittier, BSN, RN Heart Failure Nurse Navigator Secure Chat Only   

## 2022-09-14 NOTE — Procedures (Addendum)
HD Note:  Some information was entered later than the data was gathered due to patient care needs. The stated time with the data is accurate.  Took over patient care in the last 55 min of treatment.  TX duration: 3.25  Patient tolerated well. Patient heart rhythm as A-fib with PVC  Alert nd oriented.  Patient complained of right arm discomfort that she stated needed no interventions.   Hand-off given to patient's nurse.   Access used:  Left upper arm fistula Access issues: None  Total UF removed: 3000 ml     [facility]

## 2022-09-14 NOTE — Progress Notes (Signed)
ANTICOAGULATION CONSULT NOTE  Pharmacy Consult for Heparin  Indication: chest pain/ACS, s/p cath   Allergies  Allergen Reactions   Morphine Shortness Of Breath and Anaphylaxis   Prednisone Other (See Comments)    Other reaction(s): Other (See Comments) Muscle spasms Patient says prednisone causes her to cramp all over, muscle spasms uncontrolled   Tuna [Fish Allergy] Itching, Swelling, Rash and Other (See Comments)    Face droops also   Amlodipine Other (See Comments)    Angioedema (09/05/17 ED visit)   Iodinated Contrast Media Itching   Tape Itching    Adhesive tape   paper tape ok    Patient Measurements: Height: 5\' 4"  (162.6 cm) Weight: 76.3 kg (168 lb 3.4 oz) IBW/kg (Calculated) : 54.7 Heparin dosing weight: 70 kg  Vital Signs: Temp: 98.4 F (36.9 C) (07/01 1608) Temp Source: Oral (07/01 1608) BP: 98/72 (07/01 1600) Pulse Rate: 89 (07/01 1300)  Labs: Recent Labs    09/12/22 2330 09/13/22 0141 09/13/22 1847 09/14/22 0243 09/14/22 1519  HGB 12.2  14.3 12.0  --  11.9*  --   HCT 40.0  42.0 39.0  --  38.1  --   PLT 145* 138*  --  147*  --   LABPROT 15.1  --   --   --   --   INR 1.2  --   --   --   --   HEPARINUNFRC  --   --  0.24* 0.32 0.28*  CREATININE 6.06*  6.20* 6.11*  --  8.09*  --   TROPONINIHS 40*  --   --   --   --      Estimated Creatinine Clearance: 8.7 mL/min (A) (by C-G formula based on SCr of 8.09 mg/dL (H)).   Assessment: 47 y/o F CODE STEMI s/p cath, no atherosclerotic disease, occlusions appeared thrombotic per MD, Heparin per pharmacy.  Heparin level 0.28 this PM  Goal of Therapy:  Heparin level 0.3-0.7 units/ml Monitor platelets by anticoagulation protocol: Yes   Plan:  Heparin to 1150 units / hr Next level in AM Planning DOAC 7/2?  Thank you Okey Regal, PharmD

## 2022-09-14 NOTE — TOC Benefit Eligibility Note (Signed)
Pharmacy Patient Advocate Encounter  Insurance verification completed.    The patient is insured through [facility] Medicare Part D  Ran test claim for Eliquis 5 mg and the current 30 day co-pay is $0.00.  Ran test claim for Xarelto 20 mg and the current 30 day co-pay is $0.00.  This test claim was processed through [facility]- copay amounts may vary at other pharmacies due to pharmacy/plan contracts, or as the patient moves through the different stages of their insurance plan.    Roland Earl, CPHT Pharmacy Patient Advocate Specialist [facility] Patient Advocate Team Direct Number: [phone]  Fax: [phone]

## 2022-09-14 NOTE — Progress Notes (Signed)
Pt moved out of bed with PT, which sounds like her baseline. Discussed with pt and husband MI, med importance, Afib (gave booklet), NTG, and importance of mobility. Voiced understanding. Will place referral for [place] CRPII however she would not be appropriate currently in her physical state. Will sign off.  [id number] Ethelda Chick BS, ACSM-CEP 09/14/2022 11:48 AM

## 2022-09-14 NOTE — Evaluation (Signed)
Physical Therapy Evaluation Patient Details Name: Desiree [name] MRN: [id number] DOB: 1975/06/04 Today's Date: 09/14/2022  History of Present Illness  Desiree [name] is a 47 y.o. female who is being seen 09/13/2022 for the evaluation of Acute Anterior STEMI. PMH: ESRD (T/TH/Sat), HTN, Seizures, Migraine, and Gout   Clinical Impression  Pt admitted with above. Per chart, RN, and patient, patients falls frequently at home. Pt with bilat LE pain which increases with WBing greatly limiting pt's ability to tolerate standing and ability to walk. Pt requiring modA for transfers and benefits from RW for transfer to chair. Pt to benefit from inpatient rehab program > 3 hours/day to achieve safe mod I level of function and decrease risk of falling as pt's spouse works and her 80yo father is unable to physical assist. Acute PT to cont to follow.       Recommendations for follow up therapy are one component of a multi-disciplinary discharge planning process, led by the attending physician.  Recommendations may be updated based on patient status, additional functional criteria and insurance authorization.  Follow Up Recommendations Can patient physically be transported by private vehicle: Yes     Assistance Recommended at Discharge Frequent or constant Supervision/Assistance  Patient can return home with the following  A little help with walking and/or transfers;A little help with bathing/dressing/bathroom;Assist for transportation;Assistance with cooking/housework    Equipment Recommendations Rolling walker (2 wheels)  Recommendations for Other Services  OT consult    Functional Status Assessment Patient has had a recent decline in their functional status and demonstrates the ability to make significant improvements in function in a reasonable and predictable amount of time.     Precautions / Restrictions Precautions Precautions: Fall Restrictions Weight Bearing Restrictions: No       Mobility  Bed Mobility Overal bed mobility: Needs Assistance Bed Mobility: Rolling, Sidelying to Sit Rolling: Min assist Sidelying to sit: Mod assist       General bed mobility comments: pt with report of pain t/o body immeadiately upon moving, increased time, modA for LE management and trunk elevation    Transfers Overall transfer level: Needs assistance Equipment used: Rolling walker (2 wheels) Transfers: Sit to/from Stand, Bed to chair/wheelchair/BSC Sit to Stand: Mod assist   Step pivot transfers: Min assist       General transfer comment: pt reports "my legs wont let me do it" on the initial attempt of trying to stand up to RW. Pt then pushed up from bed rail on R and PTs hand on L with modA and RW was brought to patient. Pt able to complete 5 short steps to chair with labored effort and heavy reliance on UEs on RW, pt near tears due to 10/10 pain. upon sitting pt reports "I can't breath" Pt given verbal cues to slow down breathing, SpO2 > 98% on 3Lo2 via [place]    Ambulation/Gait               General Gait Details: limited to step pvt transfer to chair due to LE pain  Stairs            Wheelchair Mobility    Modified Rankin (Stroke Patients Only)       Balance Overall balance assessment: History of Falls (requires RW for safe standing)  Pertinent Vitals/Pain Pain Assessment Pain Assessment: Faces Faces Pain Scale: Hurts whole lot Pain Location: knees and ankles, especially in standing Pain Descriptors / Indicators: Sharp Pain Intervention(s): Monitored during session    Home Living Family/patient expects to be discharged to:: Private residence Living Arrangements: Spouse/significant other (and 80yo father) Available Help at Discharge: Family;Available PRN/intermittently (father present but unable to physically assist) Type of Home: Apartment Home Access: Level entry       Home  Layout: One level Home Equipment: Toilet riser;Shower seat (a Production manager")      Prior Function Prior Level of Function : Needs assist             Mobility Comments: pt reports frequent falls and using a "staff" to help her walker, reports sedentary lifestyle ADLs Comments: reports assist for lower body dressing     Hand Dominance   Dominant Hand: Right    Extremity/Trunk Assessment   Upper Extremity Assessment Upper Extremity Assessment: Generalized weakness    Lower Extremity Assessment Lower Extremity Assessment: Generalized weakness    Cervical / Trunk Assessment Cervical / Trunk Assessment: Normal  Communication   Communication: No difficulties  Cognition Arousal/Alertness: Awake/alert Behavior During Therapy: Flat affect Overall Cognitive Status: No family/caregiver present to determine baseline cognitive functioning                                 General Comments: pt with noted delayed processing vs HOH, pt appears to have decreased insight to deficits as she is not concerned regarding her freq falls stating "we moved everything out of the way so I don't hit my head"        General Comments General comments (skin integrity, edema, etc.): pt on 3lO2 via [place], SpO2 >97%, HR up to 110s during mobility    Exercises     Assessment/Plan    PT Assessment Patient needs continued PT services  PT Problem List Decreased strength;Decreased activity tolerance;Decreased balance;Decreased mobility;Decreased safety awareness;Decreased knowledge of use of DME;Decreased knowledge of precautions;Cardiopulmonary status limiting activity       PT Treatment Interventions DME instruction;Gait training;Functional mobility training;Therapeutic activities;Therapeutic exercise;Balance training    PT Goals (Current goals can be found in the Care Plan section)  Acute Rehab PT Goals Patient Stated Goal: to get stronger PT Goal Formulation: With patient Time For Goal  Achievement: 09/28/22 Potential to Achieve Goals: Fair    Frequency Min 1X/week     Co-evaluation               AM-PAC PT "6 Clicks" Mobility  Outcome Measure Help needed turning from your back to your side while in a flat bed without using bedrails?: A Lot Help needed moving from lying on your back to sitting on the side of a flat bed without using bedrails?: A Lot Help needed moving to and from a bed to a chair (including a wheelchair)?: A Lot Help needed standing up from a chair using your arms (e.g., wheelchair or bedside chair)?: A Lot Help needed to walk in hospital room?: A Lot Help needed climbing 3-5 steps with a railing? : Total 6 Click Score: 11    End of Session Equipment Utilized During Treatment: Gait belt Activity Tolerance: Patient limited by pain Patient left: in chair;with call bell/phone within reach;with nursing/sitter in room Nurse Communication: Mobility status;Other (comment) (pain) PT Visit Diagnosis: Unsteadiness on feet (R26.81);Muscle weakness (generalized) (M62.81);Difficulty in walking, not elsewhere classified (R26.2)  Time: 1610-9604 PT Time Calculation (min) (ACUTE ONLY): 32 min   Charges:   PT Evaluation $PT Eval Moderate Complexity: 1 Mod PT Treatments $Therapeutic Activity: 8-22 mins        Lewis Shock, PT, DPT Acute Rehabilitation Services Secure chat preferred Office #: [phone]   Iona Hansen 09/14/2022, 12:28 PM

## 2022-09-14 NOTE — Progress Notes (Addendum)
Cardiology Progress Note  Patient ID: Desiree [name] MRN: [id number] DOB: July 09, 1975 Date of Encounter: 09/13/2022  Primary Cardiologist: Jodelle Red, MD  Subjective   Mild chest pain this am. Just finished HD.   Inpatient Medications  Scheduled Meds:  aspirin EC  81 mg Oral Daily   Chlorhexidine Gluconate Cloth  6 each Topical Daily   [START ON 09/14/2022] clopidogrel  75 mg Oral Q breakfast   feeding supplement (NEPRO CARB STEADY)  237 mL Oral BID BM   ferric citrate  210 mg Oral TID WC   fluticasone furoate-vilanterol  1 puff Inhalation Daily   hydrALAZINE  25 mg Oral Q8H   isosorbide mononitrate  30 mg Oral Daily   metoprolol tartrate  25 mg Oral BID   pantoprazole  20 mg Oral Daily   potassium chloride  40 mEq Oral Once   sevelamer carbonate  1,600 mg Oral TID WC   sodium chloride flush  3 mL Intravenous Q12H   Continuous Infusions:  sodium chloride 10 mL/hr at 09/13/22 0700   heparin     PRN Meds: sodium chloride, acetaminophen, ALPRAZolam, cyclobenzaprine, hydrALAZINE, HYDROmorphone (DILAUDID) injection, ipratropium-albuterol, lactulose, nitroGLYCERIN, ondansetron, oxyCODONE, sodium chloride flush   Telemetry   Atrial fib, tachy with rates 100-120 bpm-personally reviewed    ECG   NO am tracing   Physical Exam   Vitals:   09/13/22 0615 09/13/22 0630 09/13/22 0645 09/13/22 0700  BP: 139/80 137/86 (!) 148/84 (!) 141/84  Pulse: 94 90 (!) 105 (!) 101  Resp: (!) 29 (!) 23 (!) 30 (!) 23  Temp:      TempSrc:      SpO2:  100% 100% 99%  Weight:      Height:        Intake/Output Summary (Last 24 hours) at 09/13/2022 0812 Last data filed at 09/13/2022 0700 Gross per 24 hour  Intake 54.16 ml  Output --  Net 54.16 ml       09/13/2022    5:00 AM 09/12/2022   11:37 PM 04/30/2022    9:39 AM  Last 3 Weights  Weight (lbs) 168 lb 3.4 oz 163 lb 2.3 oz 185 lb  Weight (kg) 76.3 kg 74 kg 83.915 kg    Body mass index is 28.87 kg/m.    General: Well  developed, well nourished, NAD  HEENT: OP clear, mucus membranes moist  SKIN: warm, dry. No rashes. Neuro: No focal deficits  Musculoskeletal: Muscle strength 5/5 all ext  Psychiatric: Mood and affect normal  Neck: No JVD Lungs:Clear bilaterally, no wheezes, rhonci, crackles Cardiovascular: Irregular irregular. Tachy.  Abdomen:Soft. Bowel sounds present. Non-tender.  Extremities: No lower extremity edema.    Labs  High Sensitivity Troponin:   Recent Labs  Lab 09/12/22 2330  TROPONINIHS 40*     Cardiac EnzymesNo results for input(s): "TROPONINI" in the last 168 hours. No results for input(s): "TROPIPOC" in the last 168 hours.  Chemistry Recent Labs  Lab 09/12/22 2330 09/13/22 0141  NA 139  138 137  K 4.1  4.1 3.3*  CL 95*  99 96*  CO2 25 27  GLUCOSE 104*  102* 133*  BUN 30*  38* 30*  CREATININE 6.06*  6.20* 6.11*  CALCIUM 9.3 8.9  PROT 8.6*  --   ALBUMIN 3.5  --   AST 26  --   ALT 22  --   ALKPHOS 77  --   BILITOT 0.6  --   GFRNONAA 8* 8*  ANIONGAP 19* 14  Hematology Recent Labs  Lab 09/12/22 2330 09/13/22 0141  WBC 5.4 4.4  RBC 4.66 4.63  HGB 12.2  14.3 12.0  HCT 40.0  42.0 39.0  MCV 85.8 84.2  MCH 26.2 25.9*  MCHC 30.5 30.8  RDW 18.3* 18.1*  PLT 145* 138*   BNPNo results for input(s): "BNP", "PROBNP" in the last 168 hours.  DDimer No results for input(s): "DDIMER" in the last 168 hours.   Radiology  CARDIAC CATHETERIZATION  Addendum Date: 09/13/2022     Lat 3rd Mrg lesion is 100% stenosed.  Balloon angioplasty was performed using a BALLN EMERGE MR 2.0X12.   Post intervention, there is a 0% residual stenosis.   Dist LAD lesion is 100% stenosed.  Balloon angioplasty was performed using a BALLN EMERGE MR 2.0X12.   Post intervention, there is a 100% residual stenosis.   Mid Cx lesion is 50% stenosed.  Thrombotic.   LV end diastolic pressure is moderately elevated.   Recommend to resume Apixaban, at currently prescribed dose and frequency.    Recommend concurrent antiplatelet therapy of Clopidogrel 75mg  daily for 12 months. No atherosclerotic coronary artery disease.  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  Angioplasty done in both areas but neither area receiving TIMI-3 flow.  Resume heparin 6 hours after femoral sheath removal.  She will need echocardiogram to look for source of embolism.  Would consider long-term oral anticoagulation given her presentation here today.  Could also use clopidogrel for antiplatelet therapy depending on whether or not she has bleeding issues.  Will watch in 2 heart. I spoke to her daughter, Desiree [name], and conveyed the results.  Result Date: 09/13/2022   Lat 3rd Mrg lesion is 100% stenosed.  Balloon angioplasty was performed using a BALLN EMERGE MR 2.0X12.   Post intervention, there is a 0% residual stenosis.   Dist LAD lesion is 100% stenosed.  Balloon angioplasty was performed using a BALLN EMERGE MR 2.0X12.   Post intervention, there is a 100% residual stenosis.   Mid Cx lesion is 50% stenosed.  Thrombotic.   LV end diastolic pressure is moderately elevated.   Recommend to resume Apixaban, at currently prescribed dose and frequency.   Recommend concurrent antiplatelet therapy of Clopidogrel 75mg  daily for 12 months. No atherosclerotic coronary artery disease.  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  Angioplasty done in both areas but neither area receiving TIMI-3 flow.  Resume heparin 6 hours after femoral sheath removal.  She will need echocardiogram to look for source of embolism.  Would consider long-term oral anticoagulation given her presentation here today.  Could also use clopidogrel for antiplatelet therapy depending on whether or not she has bleeding issues.  Will watch in 2 heart. I spoke to her daughter, Desiree [name], and conveyed the results.   DG Chest  Port 1 View  Result Date: 09/13/2022 CLINICAL DATA:  Chest pain EXAM: PORTABLE CHEST 1 VIEW COMPARISON:  04/30/2022 FINDINGS: Cardiomegaly with mild perihilar edema. No definite pleural effusions. No pneumothorax. IMPRESSION: Cardiomegaly with mild perihilar edema. Electronically Signed   By: Charline Bills M.D.   On: 09/13/2022 00:16    Cardiac Studies  LHC 09/13/2022   Lat 3rd Mrg lesion is 100% stenosed.  Balloon angioplasty was performed using a BALLN EMERGE MR 2.0X12.   Post intervention, there is a 0% residual stenosis.  Dist LAD lesion is 100% stenosed.  Balloon angioplasty was performed using a BALLN EMERGE MR 2.0X12.   Post intervention, there is a 100% residual stenosis.   Mid Cx lesion is 50% stenosed.  Thrombotic.   LV end diastolic pressure is moderately elevated.   Recommend to resume Apixaban, at currently prescribed dose and frequency.   Recommend concurrent antiplatelet therapy of Clopidogrel 75mg  daily for 12 months.  Patient Profile  Desiree [name] is a 47 y.o. female with ESRD, chronic hypoxic respiratory failure (thought to be related to extra volume), hypertension, diastolic heart failure, seizures who was admitted on 09/13/2022 for anterior STEMI.  Assessment & Plan   CAD/Anterior STEMI: Admitted 09/13/22 with anterior STEMI. She is felt to have had thromboembolic event to the coronaries. Also found to have atrial fibrillation which could explain source for the cardioembolic event. LE venous dopplers with no evidence of DVT. Angioplasty performed in the OM3 and distal LAD with no stent placement.  Poor flow into the distal LAD following PCI.  No chest pain this am.  She is currently on ASA and Plavix as well as IV heparin. I would favor continuing her IV heparin today and switching her regimen to Plavix and Eliquis tomorrow. Add high intensity statin.  Continue Imdur and metoprolol   Ischemic cardiomyopathy: LVEF=35-40% on post MI echo yesterday. Her LV function was  normal in October 2023. Will continue beta blocker for now. Will ask Nephrology to comment on ability to add an ARB or Entresto.   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 Present on echo in October 2023. No further evaluation right now.   HTN: BP elevated today. Will follow and adjust medications as needed in this dialysis patient.   ESRD: HD on Tuesday Thursday Saturday.  Nephrology is following.   Chronic respiratory failure: Thought to be related to volume overload.  Nephrology managing fluid status. She completed HD today  Will monitor in ICU today given ongoing complaints, rapid atrial fib post HD.   50 minutes critical care time  Verne Carrow, MD, [facility] 09/14/2022 7:56 AM

## 2022-09-15 DIAGNOSIS — I2109 ST elevation (STEMI) myocardial infarction involving other coronary artery of anterior wall: Secondary | ICD-10-CM | POA: Diagnosis not present

## 2022-09-15 LAB — CBC
HCT: 37.3 % (ref 36.0–46.0)
Hemoglobin: 11.5 g/dL — ABNORMAL LOW (ref 12.0–15.0)
MCH: 26 pg (ref 26.0–34.0)
MCHC: 30.8 g/dL (ref 30.0–36.0)
MCV: 84.2 fL (ref 80.0–100.0)
Platelets: 153 10*3/uL (ref 150–400)
RBC: 4.43 MIL/uL (ref 3.87–5.11)
RDW: 18.5 % — ABNORMAL HIGH (ref 11.5–15.5)
WBC: 6.6 10*3/uL (ref 4.0–10.5)
nRBC: 0 % (ref 0.0–0.2)

## 2022-09-15 LAB — BASIC METABOLIC PANEL
Anion gap: 12 (ref 5–15)
BUN: 37 mg/dL — ABNORMAL HIGH (ref 6–20)
CO2: 27 mmol/L (ref 22–32)
Calcium: 9.2 mg/dL (ref 8.9–10.3)
Chloride: 94 mmol/L — ABNORMAL LOW (ref 98–111)
Creatinine, Ser: 6.19 mg/dL — ABNORMAL HIGH (ref 0.44–1.00)
GFR, Estimated: 8 mL/min — ABNORMAL LOW (ref >60)
Glucose, Bld: 90 mg/dL (ref 70–99)
Potassium: 4.7 mmol/L (ref 3.5–5.1)
Sodium: 133 mmol/L — ABNORMAL LOW (ref 135–145)

## 2022-09-15 LAB — HEMOGLOBIN A1C
Hgb A1c MFr Bld: 5.2 % (ref 4.8–5.6)
Mean Plasma Glucose: 103 mg/dL

## 2022-09-15 LAB — HEPARIN LEVEL (UNFRACTIONATED): Heparin Unfractionated: 0.31 IU/mL (ref 0.30–0.70)

## 2022-09-15 LAB — PHOSPHORUS: Phosphorus: 6 mg/dL — ABNORMAL HIGH (ref 2.5–4.6)

## 2022-09-15 LAB — GLUCOSE, CAPILLARY: Glucose-Capillary: 110 mg/dL — ABNORMAL HIGH (ref 70–99)

## 2022-09-15 LAB — HEPATITIS B SURFACE ANTIBODY, QUANTITATIVE: Hep B S AB Quant (Post): 11.9 m[IU]/mL

## 2022-09-15 MED ORDER — LIDOCAINE-PRILOCAINE 2.5-2.5 % EX CREA: 1.0000 | TOPICAL_CREAM | CUTANEOUS | Status: DC | PRN

## 2022-09-15 MED ORDER — DOXERCALCIFEROL 4 MCG/2ML IV SOLN
INTRAVENOUS | Status: AC
  Administered 2022-09-15: 4 ug via INTRAVENOUS
  Filled 2022-09-15: qty 2

## 2022-09-15 MED ORDER — PENTAFLUOROPROP-TETRAFLUOROETH EX AERO: 1.0000 | INHALATION_SPRAY | CUTANEOUS | Status: DC | PRN

## 2022-09-15 MED ORDER — APIXABAN 5 MG PO TABS: 5.0000 mg | ORAL_TABLET | Freq: Two times a day (BID) | ORAL | Status: DC

## 2022-09-15 MED ORDER — LIDOCAINE HCL (PF) 1 % IJ SOLN: 5.0000 mL | INTRAMUSCULAR | Status: DC | PRN

## 2022-09-15 MED ORDER — APIXABAN 5 MG PO TABS
5.0000 mg | ORAL_TABLET | Freq: Two times a day (BID) | ORAL | Status: DC
  Administered 2022-09-15 – 2022-09-16 (×4): 5 mg via ORAL
  Filled 2022-09-15 (×4): qty 1

## 2022-09-15 NOTE — Progress Notes (Addendum)
OT Cancellation Note  Patient Details Name: Desiree [name] MRN: [id number] DOB: 08-21-1975   Cancelled Treatment:    Reason Eval/Treat Not Completed: Patient at procedure or test/ unavailable (pt getting dialysis in room, per RN with plans to transfer to floor afterward. Will follow up for OT eval as schedule permits)  Carver Fila, OTD, OTR/L [facility] [phone]   Dalphine Handing 09/15/2022, 9:58 AM

## 2022-09-15 NOTE — Progress Notes (Signed)
Rounding Note    Patient Name: Desiree [name] Date of Encounter: 09/15/2022  [facility] Cardiologist: Jodelle Red, MD   Subjective   Feeling better this morning.  Received dialysis yesterday.  No chest pain or shortness of breath this morning.  Remains in atrial fibrillation.  Inpatient Medications    Scheduled Meds:  aspirin EC  81 mg Oral Daily   atorvastatin  80 mg Oral Daily   Chlorhexidine Gluconate Cloth  6 each Topical Daily   Chlorhexidine Gluconate Cloth  6 each Topical Q0600   cinacalcet  60 mg Oral Q supper   clopidogrel  75 mg Oral Q breakfast   doxercalciferol  4 mcg Intravenous Q T,Th,Sa-HD   feeding supplement (NEPRO CARB STEADY)  237 mL Oral BID BM   ferric citrate  210 mg Oral TID WC   fluticasone furoate-vilanterol  1 puff Inhalation Daily   hydrALAZINE  25 mg Oral Q8H   isosorbide mononitrate  30 mg Oral Daily   metoprolol tartrate  25 mg Oral BID   pantoprazole  20 mg Oral Daily   sevelamer carbonate  1,600 mg Oral TID WC   sodium chloride flush  3 mL Intravenous Q12H   Continuous Infusions:  sodium chloride Stopped (09/13/22 1058)   heparin 1,150 Units/hr (09/15/22 0800)   PRN Meds: sodium chloride, acetaminophen, ALPRAZolam, calcium carbonate, cyclobenzaprine, diphenhydrAMINE, hydrALAZINE, HYDROmorphone (DILAUDID) injection, ipratropium-albuterol, lactulose, lidocaine (PF), lidocaine-prilocaine, nitroGLYCERIN, ondansetron, oxyCODONE, pentafluoroprop-tetrafluoroeth, sodium chloride flush   Vital Signs    Vitals:   09/15/22 0630 09/15/22 0700 09/15/22 0745 09/15/22 0800  BP: 96/65 98/68  (!) 97/55  Pulse: (!) 101 (!) 108  (!) 110  Resp: (!) 22 16  (!) 26  Temp:   98.4 F (36.9 C)   TempSrc:   Oral   SpO2: 100% 100%  98%  Weight:      Height:        Intake/Output Summary (Last 24 hours) at 09/15/2022 0811 Last data filed at 09/15/2022 0800 Gross per 24 hour  Intake 707.13 ml  Output 3000 ml  Net -2292.87 ml       09/13/2022    5:00 AM 09/12/2022   11:37 PM 04/30/2022    9:39 AM  Last 3 Weights  Weight (lbs) 168 lb 3.4 oz 163 lb 2.3 oz 185 lb  Weight (kg) 76.3 kg 74 kg 83.915 kg      Telemetry    Atrial fibrillation, heart rate in the 90s.- Personally Reviewed   Physical Exam  Alert, oriented, NAD GEN: No acute distress.   Neck: No JVD Cardiac: irregularly irregular, no murmurs, rubs, or gallops.  Respiratory: Clear to auscultation bilaterally. GI: Soft, nontender, non-distended  MS: No edema; No deformity. Neuro:  Nonfocal  Psych: Normal affect   Labs    High Sensitivity Troponin:   Recent Labs  Lab 09/12/22 2330  TROPONINIHS 40*     Chemistry Recent Labs  Lab 09/12/22 2330 09/13/22 0141 09/14/22 0243 09/15/22 0040  NA 139  138 137 133* 133*  K 4.1  4.1 3.3* 4.5 4.7  CL 95*  99 96* 92* 94*  CO2 25 27 25 27   GLUCOSE 104*  102* 133* 117* 90  BUN 30*  38* 30* 55* 37*  CREATININE 6.06*  6.20* 6.11* 8.09* 6.19*  CALCIUM 9.3 8.9 9.2 9.2  PROT 8.6*  --   --   --   ALBUMIN 3.5  --   --   --   AST 26  --   --   --  ALT 22  --   --   --   ALKPHOS 77  --   --   --   BILITOT 0.6  --   --   --   GFRNONAA 8* 8* 6* 8*  ANIONGAP 19* 14 16* 12    Lipids  Recent Labs  Lab 09/14/22 0243  CHOL 107  TRIG 35  HDL 36*  LDLCALC 64  CHOLHDL 3.0    Hematology Recent Labs  Lab 09/13/22 0141 09/14/22 0243 09/15/22 0040  WBC 4.4 7.8 6.6  RBC 4.63 4.55 4.43  HGB 12.0 11.9* 11.5*  HCT 39.0 38.1 37.3  MCV 84.2 83.7 84.2  MCH 25.9* 26.2 26.0  MCHC 30.8 31.2 30.8  RDW 18.1* 18.6* 18.5*  PLT 138* 147* 153   Thyroid  Recent Labs  Lab 09/14/22 0243  TSH 0.954    BNP Recent Labs  Lab 09/14/22 0243  BNP 1,704.8*    DDimer No results for input(s): "DDIMER" in the last 168 hours.   Radiology    ECHOCARDIOGRAM COMPLETE BUBBLE STUDY  Result Date: 09/13/2022    ECHOCARDIOGRAM REPORT   Patient Name:   Desiree [name] Date of Exam: 09/13/2022 Medical Rec #:  [id number]        Height:       64.0 in Accession #:    [id number]      Weight:       168.2 lb Date of Birth:  Feb 23, 1976        BSA:          1.818 m Patient Age:    47 years        BP:           126/85 mmHg Patient Gender: F               HR:           86 bpm. Exam Location:  Inpatient Procedure: 2D Echo, Color Doppler, Cardiac Doppler and Saline Contrast Bubble            Study Indications:    acute myocardial infarction  History:        Patient has prior history of Echocardiogram examinations, most                 recent 01/05/2022. CHF, End stage renal disease,                 Arrythmias:Atrial Fibrillation, Signs/Symptoms:Chest Pain; Risk                 Factors:Hypertension and cocaine use.  Sonographer:    Delcie Roch RDCS Referring Phys: 74 JAYADEEP S VARANASI IMPRESSIONS  1. Left ventricular ejection fraction, by estimation, is 35 to 40%. The left ventricle has moderately decreased function. The left ventricle demonstrates regional wall motion abnormalities (see scoring diagram/findings for description). Left ventricular  diastolic parameters are indeterminate.  2. Right ventricular systolic function is normal. The right ventricular size is normal. There is severely elevated pulmonary artery systolic pressure.  3. A small pericardial effusion is present. The pericardial effusion is circumferential.  4. The mitral valve is abnormal. Mild mitral valve regurgitation. No evidence of mitral stenosis. Severe mitral annular calcification (MAC). There is a mobile echodensity measuring 1.7 x 0.45 cm that is attached to the atrial surface of the posterior mitral valve annulus. Seen in prior echo images. Differential diagnosis include mobile MAC, calcified thrombus, calcified vegetation. 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 Clinical correlation is recommended. Consider  TEE for further evaluation.  5. The tricuspid valve is abnormal. Tricuspid valve regurgitation is moderate to severe.  6. The aortic valve was not well  visualized. Aortic valve regurgitation is not visualized. No aortic stenosis is present.  7. The inferior vena cava is dilated in size with <50% respiratory variability, suggesting right atrial pressure of 15 mmHg.  8. Agitated saline contrast bubble study was negative, with no evidence of any interatrial shunt. Comparison(s): Changes from prior study are noted. LVEF worsened to 35-40% with RWMA. FINDINGS  Left Ventricle: Left ventricular ejection fraction, by estimation, is 35 to 40%. The left ventricle has moderately decreased function. The left ventricle demonstrates regional wall motion abnormalities. The left ventricular internal cavity size was normal in size. There is no left ventricular hypertrophy. Left ventricular diastolic parameters are indeterminate.  LV Wall Scoring: The apical lateral segment, apical inferior segment, and apex are akinetic. The entire anterior wall, mid and distal anterior septum, inferior septum, and posterior wall are hypokinetic. The antero-lateral wall, inferior wall, and basal anteroseptal segment are normal. Right Ventricle: The right ventricular size is normal. No increase in right ventricular wall thickness. Right ventricular systolic function is normal. There is severely elevated pulmonary artery systolic pressure. The tricuspid regurgitant velocity is 3.38 m/s, and with an assumed right atrial pressure of 15 mmHg, the estimated right ventricular systolic pressure is 60.7 mmHg. Left Atrium: Left atrial size was normal in size. Right Atrium: Right atrial size was normal in size. Pericardium: A small pericardial effusion is present. The pericardial effusion is circumferential. Mitral Valve: The mitral valve is abnormal. Severe mitral annular calcification. Mild mitral valve regurgitation. No evidence of mitral valve stenosis. MV peak gradient, 14.1 mmHg. The mean mitral valve gradient is 4.3 mmHg. Tricuspid Valve: The tricuspid valve is abnormal. Tricuspid valve regurgitation  is moderate to severe. No evidence of tricuspid stenosis. Aortic Valve: The aortic valve was not well visualized. There is moderate aortic valve annular calcification. Aortic valve regurgitation is not visualized. No aortic stenosis is present. Pulmonic Valve: The pulmonic valve was normal in structure. Pulmonic valve regurgitation is mild. No evidence of pulmonic stenosis. Aorta: The aortic root and ascending aorta are structurally normal, with no evidence of dilitation. Venous: The inferior vena cava is dilated in size with less than 50% respiratory variability, suggesting right atrial pressure of 15 mmHg. IAS/Shunts: No atrial level shunt detected by color flow Doppler. Agitated saline contrast was given intravenously to evaluate for intracardiac shunting. Agitated saline contrast bubble study was negative, with no evidence of any interatrial shunt.  LEFT VENTRICLE PLAX 2D LVIDd:         4.70 cm LVIDs:         3.00 cm LV PW:         0.90 cm LV IVS:        0.80 cm LVOT diam:     1.60 cm LV SV:         38 LV SV Index:   21 LVOT Area:     2.01 cm  RIGHT VENTRICLE             IVC RV Basal diam:  2.70 cm     IVC diam: 2.30 cm RV S prime:     11.70 cm/s TAPSE (M-mode): 1.3 cm LEFT ATRIUM             Index        RIGHT ATRIUM           Index  LA diam:        4.10 cm 2.26 cm/m   RA Area:     13.50 cm LA Vol (A2C):   70.9 ml 39.01 ml/m  RA Volume:   29.10 ml  16.01 ml/m LA Vol (A4C):   51.0 ml 28.06 ml/m LA Biplane Vol: 61.3 ml 33.73 ml/m  AORTIC VALVE LVOT Vmax:   110.00 cm/s LVOT Vmean:  71.933 cm/s LVOT VTI:    0.190 m  AORTA Ao Root diam: 2.70 cm Ao Asc diam:  3.00 cm MITRAL VALVE             TRICUSPID VALVE MV Area VTI:  1.06 cm   TR Peak grad:   45.7 mmHg MV Peak grad: 14.1 mmHg  TR Vmax:        338.00 cm/s MV Mean grad: 4.3 mmHg MV Vmax:      1.88 m/s   SHUNTS MV Vmean:     89.1 cm/s  Systemic VTI:  0.19 m                          Systemic Diam: 1.60 cm Vishnu Priya Mallipeddi Electronically signed by Winfield Rast Mallipeddi Signature Date/Time: 09/13/2022/7:35:41 PM    Final    VAS [place] LOWER EXTREMITY VENOUS (DVT)  Result Date: 09/13/2022  Lower Venous DVT Study Patient Name:  ROSAMAE MCCARTEN  Date of Exam:   09/13/2022 Medical Rec #: [id number]        Accession #:    [id number] Date of Birth: 09/18/1975         Patient Gender: F Patient Age:   59 years Exam Location:  [facility] Procedure:      VAS [place] LOWER EXTREMITY VENOUS (DVT) Referring Phys: Gerri Spore O'NEAL --------------------------------------------------------------------------------  Indications: Coronary embolism.  Limitations: Pain with compression, status post right femoral cath. Comparison Study: Prior negative bilateral LEV done 01/14/21 Performing Technologist: Sherren Kerns RVS  Examination Guidelines: A complete evaluation includes B-mode imaging, spectral Doppler, color Doppler, and power Doppler as needed of all accessible portions of each vessel. Bilateral testing is considered an integral part of a complete examination. Limited examinations for reoccurring indications may be performed as noted. The reflux portion of the exam is performed with the patient in reverse Trendelenburg.  +---------+---------------+---------+-----------+---------------+--------------+ RIGHT    CompressibilityPhasicitySpontaneityProperties     Thrombus Aging +---------+---------------+---------+-----------+---------------+--------------+ CFV                     Yes      Yes        pulsatile      patent by                                                  waveforms      color and                                                                 Doppler        +---------+---------------+---------+-----------+---------------+--------------+ FV Prox  Full           Yes  Yes        pulsatile                                                                 waveforms                      +---------+---------------+---------+-----------+---------------+--------------+ FV Mid   Full                                                             +---------+---------------+---------+-----------+---------------+--------------+ FV DistalFull                                                             +---------+---------------+---------+-----------+---------------+--------------+ PFV      Full                                                             +---------+---------------+---------+-----------+---------------+--------------+ POP                     Yes      Yes        pulsatile      patent by                                                  waveforms      color and                                                                 Doppler        +---------+---------------+---------+-----------+---------------+--------------+ PTV      Full                                                             +---------+---------------+---------+-----------+---------------+--------------+ PERO     Full                                                             +---------+---------------+---------+-----------+---------------+--------------+   +---------+---------------+---------+-----------+---------------+--------------+  LEFT     CompressibilityPhasicitySpontaneityProperties     Thrombus Aging +---------+---------------+---------+-----------+---------------+--------------+ CFV                                         pulsatile      patent by                                                  waveforms      color and                                                                 Doppler        +---------+---------------+---------+-----------+---------------+--------------+ FV Prox                                     pulsatile      patent by                                                  waveforms      color and                                                                  Doppler        +---------+---------------+---------+-----------+---------------+--------------+ FV Mid                                      pulsatile      patent by                                                  waveforms      color and                                                                 Doppler        +---------+---------------+---------+-----------+---------------+--------------+ FV Distal                                   pulsatile      patent by  waveforms      color and                                                                 Doppler        +---------+---------------+---------+-----------+---------------+--------------+ PFV                                         pulsatile      patent by                                                  waveforms      color and                                                                 Doppler        +---------+---------------+---------+-----------+---------------+--------------+ POP                                         pulsatile      patent by                                                  waveforms      color and                                                                 Doppler        +---------+---------------+---------+-----------+---------------+--------------+ PTV      Full                                                             +---------+---------------+---------+-----------+---------------+--------------+ PERO     Full                                                             +---------+---------------+---------+-----------+---------------+--------------+     Summary: BILATERAL: - No evidence of deep vein thrombosis seen in the lower extremities, bilaterally. -No evidence of popliteal cyst, bilaterally.   *  See table(s)  above for measurements and observations. Electronically signed by Coral Else MD on 09/13/2022 at 7:00:56 PM.    Final     Cardiac Studies   2D Echo reviewed as above.   Patient Profile     47 y.o. female with ESRD, chronic hypoxic respiratory failure (thought to be related to extra volume), hypertension, diastolic heart failure, seizures who was admitted on 09/13/2022 for anterior STEMI.   Assessment & Plan    CAD/Anterior STEMI: Admitted 09/13/22 with anterior STEMI. She is felt to have had thromboembolic event to the coronaries. Also found to have atrial fibrillation which could explain source for the cardioembolic event. LE venous dopplers with no evidence of DVT. Angioplasty performed in the OM3 and distal LAD with no stent placement.  Poor flow into the distal LAD following PCI.  On ASA, clopidogrel, IV heparin. Echo reviewed with moderate-severe LV dysfunction EF 35-40%.  Agree this is likely an embolic event.   Ischemic cardiomyopathy: LVEF=35-40% on post MI echo yesterday. Continue beta blocker. Volume removal with HD.  Blood pressure will not allow for more rest of GDMT which is also going to be limited by the presence of end-stage renal disease.   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 No further eval indicated at this time. No clinical signs of endocarditis. In a dialysis patient, high likelihood this is mobile MAC.   HTN: BP much lower today ranging 90's-100's/55-70 mmHg. No room to escalate GDMT.   ESRD: HD on Tuesday Thursday Saturday.  Received dialysis yesterday.  Appreciate nephrology care   Chronic respiratory failure: Thought to be related to volume overload.  Nephrology managing fluid status.  Discussed this with her and she is on 3 L of oxygen per nasal cannula 24/7.  Plan today: Transition IV heparin to apixaban if okay with nephrology.  Continue clopidogrel and stop aspirin.  After dialysis if she remains stable, can move to a telemetry bed.  Limited options  for rhythm control of atrial fibrillation, especially considering the presence of a recent embolic event, ESRD, and O2 dependent respiratory failure.  Favor early rate control and anticoagulation.   For questions or updates, please contact [facility] Please consult [email or web address] for contact info under        Signed, Tonny Bollman, MD  09/15/2022, 8:11 AM

## 2022-09-15 NOTE — Discharge Instructions (Signed)

## 2022-09-15 NOTE — Progress Notes (Signed)
[facility] NEPHROLOGY PROGRESS NOTE  Assessment/ Plan: Pt is a 47 y.o. yo female with ESRD on HD, chronic hypoxic respiratory failure, hypertension presented on 09/13/2022 for anterior STEMI.  OP HD: TTS SW   3h  400/600  74.8kg  2/2 bath   heparin 6600  LUE AVF - hectorol 4 mcg IV tiw  # Anterior wall STEMI, new onset A-fib; sp LHC w/ distal LAD and distal 3rd marg LCx, sp PTA.  Switching IV heparin to Eliquis per cardiology.  # ESRD TTS, receiving dialysis today at the bedside.  UF goal adjusted.  # Acute on chronic hypoxic respiratory failure: UF with HD, using oxygen via nasal cannula.  # Anemia of CKD: Hemoglobin at goal.  # CKD-MBD/secondary hyperparathyroidism/hyperphosphatemia: Noted he is on both Auryxia and sevelamer.  Phosphorus level 6.  Also on Sensipar.  # HTN/volume: BP soft.  On hydralazine and metoprolol per cardiology.  UF with HD.  Subjective: Seen and examined in ICU.  Receiving dialysis.  Blood pressure soft.  Denies nausea, vomiting, chest pain, shortness of breath.  No new event.  Objective Vital signs in last 24 hours: Vitals:   09/15/22 0922 09/15/22 0930 09/15/22 0945 09/15/22 1000  BP:  107/75 110/70 97/70  Pulse:  (!) 101 100 (!) 105  Resp:  (!) 21 (!) 25 20  Temp:      TempSrc:      SpO2: 98% 100% 100% 97%  Weight:      Height:       Weight change:   Intake/Output Summary (Last 24 hours) at 09/15/2022 1045 Last data filed at 09/15/2022 1000 Gross per 24 hour  Intake 709.23 ml  Output --  Net 709.23 ml        Labs: RENAL PANEL Recent Labs  Lab 09/12/22 2330 09/13/22 0141 09/14/22 0243 09/15/22 0040  NA 139  138 137 133* 133*  K 4.1  4.1 3.3* 4.5 4.7  CL 95*  99 96* 92* 94*  CO2 25 27 25 27   GLUCOSE 104*  102* 133* 117* 90  BUN 30*  38* 30* 55* 37*  CREATININE 6.06*  6.20* 6.11* 8.09* 6.19*  CALCIUM 9.3 8.9 9.2 9.2  PHOS  --  6.6*  --  6.0*  ALBUMIN 3.5  --   --   --      Liver Function  Tests: Recent Labs  Lab 09/12/22 2330  AST 26  ALT 22  ALKPHOS 77  BILITOT 0.6  PROT 8.6*  ALBUMIN 3.5    No results for input(s): "LIPASE", "AMYLASE" in the last 168 hours. No results for input(s): "AMMONIA" in the last 168 hours. CBC: Recent Labs    12/05/21 0917 12/06/21 0654 03/31/22 1750 09/12/22 2330 09/13/22 0141 09/14/22 0243 09/15/22 0040  HGB 7.8*   < > 10.1* 12.2  14.3 12.0 11.9* 11.5*  MCV 88.7   < > 79.7* 85.8 84.2 83.7 84.2  TIBC 263  --   --   --   --   --   --   IRON 33  --   --   --   --   --   --    < > = values in this interval not displayed.     Cardiac Enzymes: No results for input(s): "CKTOTAL", "CKMB", "CKMBINDEX", "TROPONINI" in the last 168 hours. CBG: Recent Labs  Lab 09/13/22 0117  GLUCAP 109*     Iron Studies: No results for input(s): "IRON", "TIBC", "TRANSFERRIN", "FERRITIN" in the last  72 hours. Studies/Results: ECHOCARDIOGRAM COMPLETE BUBBLE STUDY  Result Date: 09/13/2022    ECHOCARDIOGRAM REPORT   Patient Name:   Desiree [name] Date of Exam: 09/13/2022 Medical Rec #:  [id number]       Height:       64.0 in Accession #:    [id number]      Weight:       168.2 lb Date of Birth:  02/16/1976        BSA:          1.818 m Patient Age:    47 years        BP:           126/85 mmHg Patient Gender: F               HR:           86 bpm. Exam Location:  Inpatient Procedure: 2D Echo, Color Doppler, Cardiac Doppler and Saline Contrast Bubble            Study Indications:    acute myocardial infarction  History:        Patient has prior history of Echocardiogram examinations, most                 recent 01/05/2022. CHF, End stage renal disease,                 Arrythmias:Atrial Fibrillation, Signs/Symptoms:Chest Pain; Risk                 Factors:Hypertension and cocaine use.  Sonographer:    Delcie Roch RDCS Referring Phys: 51 JAYADEEP S VARANASI IMPRESSIONS  1. Left ventricular ejection fraction, by estimation, is 35 to 40%. The left ventricle has  moderately decreased function. The left ventricle demonstrates regional wall motion abnormalities (see scoring diagram/findings for description). Left ventricular  diastolic parameters are indeterminate.  2. Right ventricular systolic function is normal. The right ventricular size is normal. There is severely elevated pulmonary artery systolic pressure.  3. A small pericardial effusion is present. The pericardial effusion is circumferential.  4. The mitral valve is abnormal. Mild mitral valve regurgitation. No evidence of mitral stenosis. Severe mitral annular calcification (MAC). There is a mobile echodensity measuring 1.7 x 0.45 cm that is attached to the atrial surface of the posterior mitral valve annulus. Seen in prior echo images. Differential diagnosis include mobile MAC, calcified thrombus, calcified vegetation. 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 Clinical correlation is recommended. Consider TEE for further evaluation.  5. The tricuspid valve is abnormal. Tricuspid valve regurgitation is moderate to severe.  6. The aortic valve was not well visualized. Aortic valve regurgitation is not visualized. No aortic stenosis is present.  7. The inferior vena cava is dilated in size with <50% respiratory variability, suggesting right atrial pressure of 15 mmHg.  8. Agitated saline contrast bubble study was negative, with no evidence of any interatrial shunt. Comparison(s): Changes from prior study are noted. LVEF worsened to 35-40% with RWMA. FINDINGS  Left Ventricle: Left ventricular ejection fraction, by estimation, is 35 to 40%. The left ventricle has moderately decreased function. The left ventricle demonstrates regional wall motion abnormalities. The left ventricular internal cavity size was normal in size. There is no left ventricular hypertrophy. Left ventricular diastolic parameters are indeterminate.  LV Wall Scoring: The apical lateral segment, apical inferior segment, and apex are akinetic. The entire  anterior wall, mid and distal anterior septum, inferior septum, and posterior wall are hypokinetic. The antero-lateral wall, inferior  wall, and basal anteroseptal segment are normal. Right Ventricle: The right ventricular size is normal. No increase in right ventricular wall thickness. Right ventricular systolic function is normal. There is severely elevated pulmonary artery systolic pressure. The tricuspid regurgitant velocity is 3.38 m/s, and with an assumed right atrial pressure of 15 mmHg, the estimated right ventricular systolic pressure is 60.7 mmHg. Left Atrium: Left atrial size was normal in size. Right Atrium: Right atrial size was normal in size. Pericardium: A small pericardial effusion is present. The pericardial effusion is circumferential. Mitral Valve: The mitral valve is abnormal. Severe mitral annular calcification. Mild mitral valve regurgitation. No evidence of mitral valve stenosis. MV peak gradient, 14.1 mmHg. The mean mitral valve gradient is 4.3 mmHg. Tricuspid Valve: The tricuspid valve is abnormal. Tricuspid valve regurgitation is moderate to severe. No evidence of tricuspid stenosis. Aortic Valve: The aortic valve was not well visualized. There is moderate aortic valve annular calcification. Aortic valve regurgitation is not visualized. No aortic stenosis is present. Pulmonic Valve: The pulmonic valve was normal in structure. Pulmonic valve regurgitation is mild. No evidence of pulmonic stenosis. Aorta: The aortic root and ascending aorta are structurally normal, with no evidence of dilitation. Venous: The inferior vena cava is dilated in size with less than 50% respiratory variability, suggesting right atrial pressure of 15 mmHg. IAS/Shunts: No atrial level shunt detected by color flow Doppler. Agitated saline contrast was given intravenously to evaluate for intracardiac shunting. Agitated saline contrast bubble study was negative, with no evidence of any interatrial shunt.  LEFT VENTRICLE  PLAX 2D LVIDd:         4.70 cm LVIDs:         3.00 cm LV PW:         0.90 cm LV IVS:        0.80 cm LVOT diam:     1.60 cm LV SV:         38 LV SV Index:   21 LVOT Area:     2.01 cm  RIGHT VENTRICLE             IVC RV Basal diam:  2.70 cm     IVC diam: 2.30 cm RV S prime:     11.70 cm/s TAPSE (M-mode): 1.3 cm LEFT ATRIUM             Index        RIGHT ATRIUM           Index LA diam:        4.10 cm 2.26 cm/m   RA Area:     13.50 cm LA Vol (A2C):   70.9 ml 39.01 ml/m  RA Volume:   29.10 ml  16.01 ml/m LA Vol (A4C):   51.0 ml 28.06 ml/m LA Biplane Vol: 61.3 ml 33.73 ml/m  AORTIC VALVE LVOT Vmax:   110.00 cm/s LVOT Vmean:  71.933 cm/s LVOT VTI:    0.190 m  AORTA Ao Root diam: 2.70 cm Ao Asc diam:  3.00 cm MITRAL VALVE             TRICUSPID VALVE MV Area VTI:  1.06 cm   TR Peak grad:   45.7 mmHg MV Peak grad: 14.1 mmHg  TR Vmax:        338.00 cm/s MV Mean grad: 4.3 mmHg MV Vmax:      1.88 m/s   SHUNTS MV Vmean:     89.1 cm/s  Systemic VTI:  0.19 m  Systemic Diam: 1.60 cm Vishnu Priya Mallipeddi Electronically signed by Winfield Rast Mallipeddi Signature Date/Time: 09/13/2022/7:35:41 PM    Final     Medications: Infusions:  sodium chloride Stopped (09/13/22 1058)   heparin 1,150 Units/hr (09/15/22 1000)    Scheduled Medications:  aspirin EC  81 mg Oral Daily   atorvastatin  80 mg Oral Daily   Chlorhexidine Gluconate Cloth  6 each Topical Daily   Chlorhexidine Gluconate Cloth  6 each Topical Q0600   cinacalcet  60 mg Oral Q supper   clopidogrel  75 mg Oral Q breakfast   doxercalciferol       doxercalciferol  4 mcg Intravenous Q T,Th,Sa-HD   feeding supplement (NEPRO CARB STEADY)  237 mL Oral BID BM   ferric citrate  210 mg Oral TID WC   fluticasone furoate-vilanterol  1 puff Inhalation Daily   hydrALAZINE  25 mg Oral Q8H   isosorbide mononitrate  30 mg Oral Daily   metoprolol tartrate  25 mg Oral BID   pantoprazole  20 mg Oral Daily   sevelamer carbonate  1,600 mg Oral  TID WC   sodium chloride flush  3 mL Intravenous Q12H    have reviewed scheduled and prn medications.  Physical Exam: General:NAD, comfortable Heart:RRR, s1s2 nl Lungs:clear b/l, no crackle Abdomen:soft, Non-tender, non-distended Extremities:No edema Dialysis Access: Left upper extremity AV fistula has good thrill and bruit.  Kiet Geer Prasad Daleon Willinger 09/15/2022,10:45 AM  LOS: 2 days

## 2022-09-15 NOTE — Progress Notes (Signed)
Pt receives out-pt HD at [facility] on TTS. Will assist as needed.   Whitnie Deleon Renal Navigator [phone] 

## 2022-09-15 NOTE — Progress Notes (Addendum)
ANTICOAGULATION CONSULT NOTE  Pharmacy Consult for Heparin  Indication: chest pain/ACS, s/p cath   Allergies  Allergen Reactions   Morphine Shortness Of Breath and Anaphylaxis   Prednisone Other (See Comments)    Other reaction(s): Other (See Comments) Muscle spasms Patient says prednisone causes her to cramp all over, muscle spasms uncontrolled   Tuna [Fish Allergy] Itching, Swelling, Rash and Other (See Comments)    Face droops also   Amlodipine Other (See Comments)    Angioedema (09/05/17 ED visit)   Iodinated Contrast Media Itching   Tape Itching    Adhesive tape   paper tape ok    Patient Measurements: Height: 5\' 4"  (162.6 cm) Weight: 76.3 kg (168 lb 3.4 oz) IBW/kg (Calculated) : 54.7 Heparin dosing weight: 70 kg  Vital Signs: Temp: 98.4 F (36.9 C) (07/02 0745) Temp Source: Oral (07/02 0745) BP: 97/55 (07/02 0800) Pulse Rate: 110 (07/02 0800)  Labs: Recent Labs    09/12/22 2330 09/13/22 0141 09/13/22 1847 09/14/22 0243 09/14/22 1519 09/15/22 0040  HGB 12.2  14.3 12.0  --  11.9*  --  11.5*  HCT 40.0  42.0 39.0  --  38.1  --  37.3  PLT 145* 138*  --  147*  --  153  LABPROT 15.1  --   --   --   --   --   INR 1.2  --   --   --   --   --   HEPARINUNFRC  --   --    < > 0.32 0.28* 0.31  CREATININE 6.06*  6.20* 6.11*  --  8.09*  --  6.19*  TROPONINIHS 40*  --   --   --   --   --    < > = values in this interval not displayed.     Estimated Creatinine Clearance: 11.3 mL/min (A) (by C-G formula based on SCr of 6.19 mg/dL (H)).   Assessment: 47 y/o F CODE STEMI s/p cath, no atherosclerotic disease, occlusions appeared thrombotic per MD, Heparin per pharmacy, plans noted for plavix and apixaban today  Heparin level 0.31 on 1150 units/hr  Goal of Therapy:  Heparin level 0.3-0.7 units/ml Monitor platelets by anticoagulation protocol: Yes   Plan:  Continue heparin 1150 units / hr Will follow anticoagulation plans  Harland German, PharmD Clinical  Pharmacist **Pharmacist phone directory can now be found on [email or web address] ([email or web address]).  Listed under  [facility].   Addendum -plans to change to apixaban (cost = $0) -wt= 76kg, SCr= 6.19  Plan -discontinue heparin -apixaban 5mg  po bid  Harland German, PharmD Clinical Pharmacist **Pharmacist phone directory can now be found on [email or web address] ([email or web address]).  Listed under [facility].

## 2022-09-15 NOTE — Evaluation (Signed)
Occupational Therapy Evaluation Patient Details Name: UILANI MACNAIR MRN: [id number] DOB: [birth year] Today's Date: 09/15/2022   History of Present Illness Keenya WAYNESHA BESCH is a 47 y.o. female who is being seen 09/13/2022 for the evaluation of Acute Anterior STEMI. PMH: ESRD (T/TH/Sat), HTN, Seizures, Migraine, and Gout   Clinical Impression   Pt lives with spouse, reports using a walking stick at baseline and is relatively ind with ADLs (with the exception of LB dressing/bathing), has assist for IADLs and utilizes transportation services. Pt currently needing mod I - mod A for ADLs, and min guard for transfers without AD. VSS on baseline 3L O2 throughout session. Pt presenting with impairments listed below, will follow acutely. Recommend HHOT at d/c.      Recommendations for follow up therapy are one component of a multi-disciplinary discharge planning process, led by the attending physician.  Recommendations may be updated based on patient status, additional functional criteria and insurance authorization.   Assistance Recommended at Discharge Intermittent Supervision/Assistance  Patient can return home with the following A little help with walking and/or transfers;A little help with bathing/dressing/bathroom;Assistance with cooking/housework;Assist for transportation;Help with stairs or ramp for entrance    Functional Status Assessment  Patient has had a recent decline in their functional status and demonstrates the ability to make significant improvements in function in a reasonable and predictable amount of time.  Equipment Recommendations  None recommended by OT (pt has all needed DME)    Recommendations for Other Services PT consult     Precautions / Restrictions Precautions Precautions: Fall Restrictions Weight Bearing Restrictions: No      Mobility Bed Mobility               General bed mobility comments: on BSC upon arrival, left seated EOB upon departure     Transfers Overall transfer level: Needs assistance Equipment used: None Transfers: Sit to/from Stand, Bed to chair/wheelchair/BSC Sit to Stand: Min guard                  Balance Overall balance assessment: Needs assistance Sitting-balance support: Feet supported Sitting balance-Leahy Scale: Good     Standing balance support: During functional activity Standing balance-Leahy Scale: Fair                             ADL either performed or assessed with clinical judgement   ADL Overall ADL's : Needs assistance/impaired Eating/Feeding: Modified independent   Grooming: Modified independent;Wash/dry face;Standing   Upper Body Bathing: Minimal assistance   Lower Body Bathing: Moderate assistance   Upper Body Dressing : Minimal assistance   Lower Body Dressing: Moderate assistance   Toilet Transfer: Min guard;Ambulation;Regular Social worker and Hygiene: Supervision/safety       Functional mobility during ADLs: Min guard       Vision Patient Visual Report: No change from baseline Additional Comments: 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, [facility] for BADL     Perception Perception Perception Tested?: No   Praxis Praxis Praxis tested?: Not tested    Pertinent Vitals/Pain Pain Assessment Pain Assessment: Faces Pain Score: 3  Faces Pain Scale: Hurts a little bit Pain Location: R knee and R ankle Pain Descriptors / Indicators: Discomfort Pain Intervention(s): Limited activity within patient's tolerance, Monitored during session, Repositioned     Hand Dominance Right   Extremity/Trunk Assessment Upper Extremity Assessment Upper Extremity Assessment: Generalized weakness   Lower Extremity Assessment Lower Extremity Assessment: Defer  to PT evaluation   Cervical / Trunk Assessment Cervical / Trunk Assessment: Normal   Communication Communication Communication: No difficulties   Cognition Arousal/Alertness:  Awake/alert Behavior During Therapy: WFL for tasks assessed/performed Overall Cognitive Status: Within Functional Limits for tasks assessed                                 General Comments: able to identify food items on menu and call/place order without assist, provides PLOF appropriately, and follows commands, aware of why she is in the hospital     General Comments  VSS on 3L O2    Exercises     Shoulder Instructions      Home Living Family/patient expects to be discharged to:: Private residence Living Arrangements: Spouse/significant other;Other (Comment) (and father) Available Help at Discharge: Family;Available PRN/intermittently Type of Home: Apartment Home Access: Level entry     Home Layout: One level     Bathroom Shower/Tub: Chief Strategy Officer: Standard     Home Equipment: Toilet riser;Shower seat;Other (comment);Adaptive equipment (lift chair) Adaptive Equipment: Reacher Additional Comments: daughter comes over 1x/week for IADLs; 3L O2 baseline      Prior Functioning/Environment Prior Level of Function : Needs assist             Mobility Comments: has a 'staff stick" for RW ADLs Comments: assist for LB ADL, uses transportation services for HD, spouse does IADLs, does her own med mgmt, but needs to "go get a pillbox"        OT Problem List: Decreased strength;Decreased range of motion;Decreased activity tolerance;Impaired balance (sitting and/or standing);Decreased safety awareness      OT Treatment/Interventions: Self-care/ADL training;Therapeutic exercise;Energy conservation;DME and/or AE instruction;Therapeutic activities;Patient/family education;Balance training    OT Goals(Current goals can be found in the care plan section) Acute Rehab OT Goals Patient Stated Goal: none stated OT Goal Formulation: With patient Time For Goal Achievement: 09/29/22 Potential to Achieve Goals: Good ADL Goals Pt Will Perform Lower  Body Dressing: with supervision;sitting/lateral leans;sit to/from stand Pt Will Transfer to Toilet: with supervision;ambulating;regular height toilet Pt Will Perform Tub/Shower Transfer: Tub transfer;Shower transfer;with supervision;ambulating;shower seat Additional ADL Goal #1: pt will be able to stand x10 min for functional task in order to improve activity tolerance for ADLs  OT Frequency: Min 1X/week    Co-evaluation              AM-PAC OT "6 Clicks" Daily Activity     Outcome Measure Help from another person eating meals?: None Help from another person taking care of personal grooming?: A Little Help from another person toileting, which includes using toliet, bedpan, or urinal?: A Little Help from another person bathing (including washing, rinsing, drying)?: A Lot Help from another person to put on and taking off regular upper body clothing?: A Little Help from another person to put on and taking off regular lower body clothing?: A Lot 6 Click Score: 17   End of Session Equipment Utilized During Treatment: Oxygen (3L) Nurse Communication: Mobility status  Activity Tolerance: Patient tolerated treatment well Patient left: in bed;with call bell/phone within reach;with bed alarm set  OT Visit Diagnosis: Unsteadiness on feet (R26.81);Other abnormalities of gait and mobility (R26.89);Muscle weakness (generalized) (M62.81);History of falling (Z91.81)                Time: 1610-9604 OT Time Calculation (min): 30 min Charges:  OT General Charges $OT Visit: 1 Visit  OT Evaluation $OT Eval Moderate Complexity: 1 Mod OT Treatments $Self Care/Home Management : 8-22 mins  Carver Fila, OTD, OTR/L SecureChat Preferred Acute Rehab [phone]   Dalphine Handing 09/15/2022, 4:47 PM

## 2022-09-15 NOTE — Progress Notes (Signed)
   09/15/22 1300  Vitals  Temp 98.4 F (36.9 C)  Temp Source Oral  BP 107/76  During Treatment Monitoring  Intra-Hemodialysis Comments Tx completed  Post Treatment  Dialyzer Clearance Lightly streaked  Duration of HD Treatment -hour(s) 3.75 hour(s)  Hemodialysis Intake (mL) 0 mL  Liters Processed 85  Fluid Removed (mL) 1300 mL  Tolerated HD Treatment Yes  Post-Hemodialysis Comments Pt goal not met d/t hypotension. Pt taking off 1.3 L,  AVG/AVF Arterial Site Held (minutes) 10 minutes  AVG/AVF Venous Site Held (minutes) 10 minutes  Fistula / Graft Left Upper arm Arteriovenous fistula  Placement Date/Time: 01/20/19 1417   Placed prior to admission: No  Orientation: Left  Access Location: Upper arm  Access Type: Arteriovenous fistula  Site Condition No complications  Fistula / Graft Assessment Present;Thrill;Bruit  Status Deaccessed  Needle Size 15  Drainage Description  (pt on heparin drop.)

## 2022-09-16 ENCOUNTER — Other Ambulatory Visit (HOSPITAL_COMMUNITY): Payer: Self-pay

## 2022-09-16 DIAGNOSIS — I2109 ST elevation (STEMI) myocardial infarction involving other coronary artery of anterior wall: Secondary | ICD-10-CM | POA: Diagnosis not present

## 2022-09-16 DIAGNOSIS — I255 Ischemic cardiomyopathy: Secondary | ICD-10-CM | POA: Diagnosis not present

## 2022-09-16 LAB — CBC
HCT: 38.3 % (ref 36.0–46.0)
Hemoglobin: 11.7 g/dL — ABNORMAL LOW (ref 12.0–15.0)
MCH: 26.2 pg (ref 26.0–34.0)
MCHC: 30.5 g/dL (ref 30.0–36.0)
MCV: 85.7 fL (ref 80.0–100.0)
Platelets: 172 10*3/uL (ref 150–400)
RBC: 4.47 MIL/uL (ref 3.87–5.11)
RDW: 18.3 % — ABNORMAL HIGH (ref 11.5–15.5)
WBC: 7 10*3/uL (ref 4.0–10.5)
nRBC: 0 % (ref 0.0–0.2)

## 2022-09-16 LAB — BASIC METABOLIC PANEL
Anion gap: 18 — ABNORMAL HIGH (ref 5–15)
BUN: 27 mg/dL — ABNORMAL HIGH (ref 6–20)
CO2: 26 mmol/L (ref 22–32)
Calcium: 9.4 mg/dL (ref 8.9–10.3)
Chloride: 91 mmol/L — ABNORMAL LOW (ref 98–111)
Creatinine, Ser: 4.83 mg/dL — ABNORMAL HIGH (ref 0.44–1.00)
GFR, Estimated: 11 mL/min — ABNORMAL LOW (ref >60)
Glucose, Bld: 102 mg/dL — ABNORMAL HIGH (ref 70–99)
Potassium: 4 mmol/L (ref 3.5–5.1)
Sodium: 135 mmol/L (ref 135–145)

## 2022-09-16 MED ORDER — CLOPIDOGREL BISULFATE 75 MG PO TABS
75.0000 mg | ORAL_TABLET | Freq: Every day | ORAL | 3 refills | Status: AC
  Filled 2022-09-16: qty 90, 90d supply, fill #0

## 2022-09-16 MED ORDER — MUPIROCIN 2 % EX OINT
1.0000 | TOPICAL_OINTMENT | Freq: Two times a day (BID) | CUTANEOUS | Status: DC
  Administered 2022-09-16 (×2): 1 via TOPICAL
  Filled 2022-09-16: qty 22

## 2022-09-16 MED ORDER — APIXABAN 5 MG PO TABS
5.0000 mg | ORAL_TABLET | Freq: Two times a day (BID) | ORAL | 11 refills | Status: DC
  Filled 2022-09-16: qty 60, 30d supply, fill #0

## 2022-09-16 MED ORDER — CHLORHEXIDINE GLUCONATE CLOTH 2 % EX PADS: 6.0000 | MEDICATED_PAD | Freq: Every day | CUTANEOUS | Status: DC

## 2022-09-16 MED ORDER — HYDRALAZINE HCL 25 MG PO TABS: 25.0000 mg | ORAL_TABLET | Freq: Two times a day (BID) | ORAL | Status: DC

## 2022-09-16 MED ORDER — METOPROLOL TARTRATE 12.5 MG HALF TABLET
12.5000 mg | ORAL_TABLET | Freq: Two times a day (BID) | ORAL | Status: DC
  Administered 2022-09-16: 12.5 mg via ORAL
  Filled 2022-09-16: qty 1

## 2022-09-16 NOTE — Progress Notes (Signed)
Pharmacist Heart Failure Core Measure Documentation  Assessment: Desiree [name] has an EF documented as 35-40% on 09/13/22  Rationale: Heart failure patients with left ventricular systolic dysfunction (LVSD) and an EF < 40% should be prescribed an angiotensin converting enzyme inhibitor (ACEI) or angiotensin receptor blocker (ARB) at discharge unless a contraindication is documented in the medical record.  This patient is not currently on an ACEI or ARB for HF.  This note is being placed in the record in order to provide documentation that a contraindication to the use of these agents is present for this encounter.  ACE Inhibitor or Angiotensin Receptor Blocker is contraindicated (specify all that apply)  []   ACEI allergy AND ARB allergy []   Angioedema []   Moderate or severe aortic stenosis []   Hyperkalemia []   Hypotension []   Renal artery stenosis [x]   Worsening renal function, preexisting renal disease or dysfunction   Marygrace Drought 09/16/2022 1:19 PM

## 2022-09-16 NOTE — Progress Notes (Signed)
SATURATION QUALIFICATIONS: (This note is used to comply with regulatory documentation for home oxygen)  Patient Saturations on Room Air at Rest = 97%  Patient Saturations on Room Air while Ambulating = 97%  Patient Saturations on n/a Liters of oxygen while Ambulating = n/a%  Please briefly explain why patient needs home oxygen: Pt does not need O2 at rest or while ambulating.

## 2022-09-16 NOTE — Progress Notes (Signed)
[facility] NEPHROLOGY PROGRESS NOTE  Assessment/ Plan: Pt is a 47 y.o. yo female with ESRD on HD, chronic hypoxic respiratory failure, hypertension presented on 09/13/2022 for anterior STEMI.  OP HD: TTS SW   3h  400/600  74.8kg  2/2 bath   heparin 6600  LUE AVF - hectorol 4 mcg IV tiw  # Anterior wall STEMI, new onset A-fib; sp LHC w/ distal LAD and distal 3rd marg LCx, sp PTA.  Switching IV heparin to Eliquis per cardiology.  # ESRD TTS, status post HD yesterday with 1.3 L UF.  Limited by hypotension.  Plan for next HD tomorrow.  # Acute on chronic hypoxic respiratory failure: UF with HD, using oxygen via nasal cannula.  # Anemia of CKD: Hemoglobin at goal.  # CKD-MBD/secondary hyperparathyroidism/hyperphosphatemia: Noted he is on both Auryxia and sevelamer. Also on Sensipar.  Monitor lab.  # HTN/volume: She is on hydralazine, metoprolol and Imdur because of CAD.  Blood pressure has been running low.  I have discussed with the cardiologist about hypotension who will adjust the cardiac medication.  Plan for UF with HD.  Subjective: Seen and examined.  She was transferred out of ICU.  Blood pressure running low this morning.  Denies nausea, vomiting, chest pain, shortness of breath.  Objective Vital signs in last 24 hours: Vitals:   09/16/22 0037 09/16/22 0443 09/16/22 0741 09/16/22 0834  BP: 98/64 (!) 94/52 (!) 82/51 97/68  Pulse: 83 92 85 87  Resp: 20 20 20    Temp:  97.8 F (36.6 C) 98.6 F (37 C)   TempSrc:  Oral Oral   SpO2: 100% 100% 100% 100%  Weight:      Height:       Weight change:   Intake/Output Summary (Last 24 hours) at 09/16/2022 1013 Last data filed at 09/15/2022 1900 Gross per 24 hour  Intake 474 ml  Output 1300 ml  Net -826 ml        Labs: RENAL PANEL Recent Labs  Lab 09/12/22 2330 09/13/22 0141 09/14/22 0243 09/15/22 0040 09/16/22 0113  NA 139  138 137 133* 133* 135  K 4.1  4.1 3.3* 4.5 4.7 4.0  CL 95*  99 96* 92*  94* 91*  CO2 25 27 25 27 26   GLUCOSE 104*  102* 133* 117* 90 102*  BUN 30*  38* 30* 55* 37* 27*  CREATININE 6.06*  6.20* 6.11* 8.09* 6.19* 4.83*  CALCIUM 9.3 8.9 9.2 9.2 9.4  PHOS  --  6.6*  --  6.0*  --   ALBUMIN 3.5  --   --   --   --      Liver Function Tests: Recent Labs  Lab 09/12/22 2330  AST 26  ALT 22  ALKPHOS 77  BILITOT 0.6  PROT 8.6*  ALBUMIN 3.5    No results for input(s): "LIPASE", "AMYLASE" in the last 168 hours. No results for input(s): "AMMONIA" in the last 168 hours. CBC: Recent Labs    12/05/21 0917 12/06/21 0654 09/12/22 2330 09/13/22 0141 09/14/22 0243 09/15/22 0040 09/16/22 0113  HGB 7.8*   < > 12.2  14.3 12.0 11.9* 11.5* 11.7*  MCV 88.7   < > 85.8 84.2 83.7 84.2 85.7  TIBC 263  --   --   --   --   --   --   IRON 33  --   --   --   --   --   --    < > =  values in this interval not displayed.     Cardiac Enzymes: No results for input(s): "CKTOTAL", "CKMB", "CKMBINDEX", "TROPONINI" in the last 168 hours. CBG: Recent Labs  Lab 09/13/22 0117 09/15/22 1520  GLUCAP 109* 110*     Iron Studies: No results for input(s): "IRON", "TIBC", "TRANSFERRIN", "FERRITIN" in the last 72 hours. Studies/Results: No results found.  Medications: Infusions:  sodium chloride Stopped (09/13/22 1058)    Scheduled Medications:  apixaban  5 mg Oral BID   atorvastatin  80 mg Oral Daily   Chlorhexidine Gluconate Cloth  6 each Topical Daily   cinacalcet  60 mg Oral Q supper   clopidogrel  75 mg Oral Q breakfast   doxercalciferol  4 mcg Intravenous Q T,Th,Sa-HD   feeding supplement (NEPRO CARB STEADY)  237 mL Oral BID BM   ferric citrate  210 mg Oral TID WC   fluticasone furoate-vilanterol  1 puff Inhalation Daily   hydrALAZINE  25 mg Oral Q8H   isosorbide mononitrate  30 mg Oral Daily   metoprolol tartrate  25 mg Oral BID   pantoprazole  20 mg Oral Daily   sevelamer carbonate  1,600 mg Oral TID WC   sodium chloride flush  3 mL Intravenous Q12H     have reviewed scheduled and prn medications.  Physical Exam: General:NAD, comfortable Heart:RRR, s1s2 nl Lungs:clear b/l, no crackle Abdomen:soft, Non-tender, non-distended Extremities:No edema Dialysis Access: Left upper extremity AV fistula has good thrill and bruit.  Islam Eichinger Prasad Cashton Hosley 09/16/2022,10:13 AM  LOS: 3 days

## 2022-09-16 NOTE — TOC Progression Note (Addendum)
Transition of Care [facility]) - Progression Note    Patient Details  Name: Desiree [name] MRN: [id number] Date of Birth: Feb 16, 1976  Transition of [facility]) CM/SW Contact  Graves-Bigelow, Lamar Laundry, RN Phone Number: 09/16/2022, 12:48 PM  Clinical Narrative:  Ambulatory sat completed for the patient. Patient is currently active with [employer] for oxygen. [employer] is checking the liter flow. Patient currently on 3 liters now. Patient is asking for smaller tanks for home. Case Manager called [employer] [facility] and Respiratory with [employer] will follow in the home to see if smaller tanks can accommodate the liter flow. Awaiting confirmation from [employer].   1258 09-16-22 [employer] states the actual order reads for 2 liters oxygen continuous. [employer] [facility] states that once the patient returns home RT technician will visit the patient so they can assess the correct liter flow to get the correct size tanks. No further needs identified.   Expected Discharge Plan: Home w Home Health Services Barriers to Discharge: Continued Medical Work up  Expected Discharge Plan and Services In-house Referral: NA Discharge Planning Services: CM Consult Post Acute Care Choice: Home Health                   DME Arranged:  (Pulsed oxygen conserving device.) DME Agency: [employer] Date DME Agency Contacted: 09/14/22 Time DME Agency Contacted: [phone] Representative spoke with at DME Agency: Zack HH Arranged: RN, Disease Management, PT, OT HH Agency: [facility] Date [facility] Agency Contacted: 09/14/22 Time HH Agency Contacted: 1545 Representative spoke with at [facility] Agency: Elnita Maxwell   Social Determinants of Health (SDOH) Interventions SDOH Screenings   Food Insecurity: No Food Insecurity (09/13/2022)  Housing: Low Risk  (09/13/2022)  Transportation Needs: No Transportation Needs (09/13/2022)  Utilities: Not At Risk (09/13/2022)  Tobacco Use: Medium Risk (09/14/2022)    Readmission Risk Interventions    09/14/2022    3:42 PM   Readmission Risk Prevention Plan  Transportation Screening Complete  Medication Review (RN Care Manager) Complete  HRI or Home Care Consult Complete  SW Recovery Care/Counseling Consult Complete  Palliative Care Screening Not Applicable  Skilled Nursing Facility Not Applicable

## 2022-09-16 NOTE — Progress Notes (Signed)
Pt's BP was 94/52 MAP 65. Attempted to notify Maryelizabeth Kaufmann, MD; requesting permission to hold Hydralazine medication with parameters to be added. Awaiting further orders.  Bari Edward, RN

## 2022-09-16 NOTE — Care Management Important Message (Signed)
Important Message  Patient Details  Name: Desiree [name] MRN: [id number] Date of Birth: [birth year]   Medicare Important Message Given:  Yes     Renie Ora 09/16/2022, 8:41 AM

## 2022-09-16 NOTE — Progress Notes (Addendum)
Physical Therapy Treatment Patient Details Name: Desiree [name] MRN: [id number] DOB: February 24, 1976 Today's Date: 09/16/2022   History of Present Illness Desiree [name] is a 47 y.o. female who is being seen 09/13/2022 for the evaluation of Acute Anterior STEMI. PMH: ESRD (T/TH/Sat), HTN, Seizures, Migraine, and Gout    PT Comments  Pt supine in bed on arrival.  She reports she was in too much pain on eval to mobilize but she is much improved today.  Based on progress will update recs to HHPT.  Pt reports she has all necessary DME at home. Will inform supervising PT of need for change in recommendations at this time.       Assistance Recommended at Discharge Frequent or constant Supervision/Assistance  If plan is discharge home, recommend the following:  Can travel by private vehicle    A little help with walking and/or transfers;A little help with bathing/dressing/bathroom;Assist for transportation;Assistance with cooking/housework   Yes  Equipment Recommendations  Rolling walker (2 wheels);None recommended by PT    Recommendations for Other Services       Precautions / Restrictions Precautions Precautions: Fall Restrictions Weight Bearing Restrictions: No (Pt is FWB)     Mobility  Bed Mobility Overal bed mobility: Modified Independent                  Transfers Overall transfer level: Modified independent Equipment used: None                    Ambulation/Gait Ambulation/Gait assistance: Supervision Gait Distance (Feet): 180 Feet Assistive device: None Gait Pattern/deviations: Step-through pattern, Trunk flexed, Antalgic       General Gait Details: Antalgic gt pattern due to surgery but no overt LOB this session.  Much improved.   Stairs             Wheelchair Mobility     Tilt Bed    Modified Rankin (Stroke Patients Only)       Balance Overall balance assessment: Needs assistance   Sitting balance-Leahy Scale: Good        Standing balance-Leahy Scale: Fair                              Cognition Arousal/Alertness: Awake/alert Behavior During Therapy: WFL for tasks assessed/performed, Agitated Overall Cognitive Status: Within Functional Limits for tasks assessed                                          Exercises      General Comments        Pertinent Vitals/Pain Pain Assessment Pain Assessment: 0-10 Pain Score: 7  Pain Location: R knee and R ankle Pain Descriptors / Indicators: Discomfort Pain Intervention(s): Monitored during session, Repositioned    Home Living                          Prior Function            PT Goals (current goals can now be found in the care plan section) Acute Rehab PT Goals Patient Stated Goal: to get stronger Potential to Achieve Goals: Fair Progress towards PT goals: Progressing toward goals    Frequency    Min 1X/week      PT Plan Current plan remains appropriate    Co-evaluation  AM-PAC PT "6 Clicks" Mobility   Outcome Measure  Help needed turning from your back to your side while in a flat bed without using bedrails?: None Help needed moving from lying on your back to sitting on the side of a flat bed without using bedrails?: None Help needed moving to and from a bed to a chair (including a wheelchair)?: A Little Help needed standing up from a chair using your arms (e.g., wheelchair or bedside chair)?: A Little Help needed to walk in hospital room?: A Little Help needed climbing 3-5 steps with a railing? : A Little 6 Click Score: 20    End of Session Equipment Utilized During Treatment: Gait belt Activity Tolerance: Patient limited by pain Patient left: with call bell/phone within reach;in bed Nurse Communication: Mobility status PT Visit Diagnosis: Unsteadiness on feet (R26.81);Muscle weakness (generalized) (M62.81);Difficulty in walking, not elsewhere classified (R26.2)      Time: 8657-8469 PT Time Calculation (min) (ACUTE ONLY): 13 min  Charges:    $Gait Training: 8-22 mins PT General Charges $$ ACUTE PT VISIT: 1 Visit                     Bonney Leitz , PTA Acute Rehabilitation Services Office [phone]    Florestine Avers 09/16/2022, 4:46 PM

## 2022-09-16 NOTE — Progress Notes (Signed)
   Patient possibly getting discharged tomorrow, 7/4. Sent eliquis and plavix to [facility] pharmacy in anticipation of DC.   Jonita Albee, PA-C 09/16/2022 1:34 PM

## 2022-09-16 NOTE — Progress Notes (Signed)
Mobility Specialist Progress Note:    09/16/22 1432  Mobility  Activity Ambulated independently in hallway  Level of Assistance Standby assist, set-up cues, supervision of patient - no hands on  Assistive Device None  Distance Ambulated (ft) 216 ft  Activity Response Tolerated well  Mobility Referral Yes  $Mobility charge 1 Mobility  Mobility Specialist Start Time (ACUTE ONLY) 1410  Mobility Specialist Stop Time (ACUTE ONLY) 1423  Mobility Specialist Time Calculation (min) (ACUTE ONLY) 13 min    Received pt in bed having no complaints and agreeable to mobility. Pt was asymptomatic throughout ambulation and returned to room w/o fault. Left in bed w/ call bell in reach and all needs met.   Thompson Grayer Mobility Specialist  Please contact vis Secure Chat or  Rehab Office [phone]

## 2022-09-16 NOTE — Progress Notes (Signed)
Rounding Note    Patient Name: Desiree [name] Date of Encounter: 09/16/2022  [facility] Cardiologist: Jodelle Red, MD   Subjective   Feeling much better today. No chest pain or dyspnea  Inpatient Medications    Scheduled Meds:  apixaban  5 mg Oral BID   atorvastatin  80 mg Oral Daily   Chlorhexidine Gluconate Cloth  6 each Topical Daily   cinacalcet  60 mg Oral Q supper   clopidogrel  75 mg Oral Q breakfast   doxercalciferol  4 mcg Intravenous Q T,Th,Sa-HD   feeding supplement (NEPRO CARB STEADY)  237 mL Oral BID BM   ferric citrate  210 mg Oral TID WC   fluticasone furoate-vilanterol  1 puff Inhalation Daily   hydrALAZINE  25 mg Oral Q8H   isosorbide mononitrate  30 mg Oral Daily   metoprolol tartrate  25 mg Oral BID   pantoprazole  20 mg Oral Daily   sevelamer carbonate  1,600 mg Oral TID WC   sodium chloride flush  3 mL Intravenous Q12H   Continuous Infusions:  sodium chloride Stopped (09/13/22 1058)   PRN Meds: sodium chloride, acetaminophen, ALPRAZolam, calcium carbonate, cyclobenzaprine, diphenhydrAMINE, hydrALAZINE, HYDROmorphone (DILAUDID) injection, ipratropium-albuterol, lactulose, lidocaine (PF), lidocaine-prilocaine, nitroGLYCERIN, ondansetron, oxyCODONE, pentafluoroprop-tetrafluoroeth, sodium chloride flush   Vital Signs    Vitals:   09/16/22 0037 09/16/22 0443 09/16/22 0741 09/16/22 0834  BP: 98/64 (!) 94/52 (!) 82/51 97/68  Pulse: 83 92 85 87  Resp: 20 20 20    Temp:  97.8 F (36.6 C) 98.6 F (37 C)   TempSrc:  Oral Oral   SpO2: 100% 100% 100% 100%  Weight:      Height:        Intake/Output Summary (Last 24 hours) at 09/16/2022 0909 Last data filed at 09/15/2022 1900 Gross per 24 hour  Intake 497.06 ml  Output 1300 ml  Net -802.94 ml      09/15/2022    3:00 PM 09/15/2022    1:18 PM 09/15/2022    9:14 AM  Last 3 Weights  Weight (lbs) 157 lb 8 oz 165 lb 5.5 oz 168 lb 3.4 oz  Weight (kg) 71.442 kg 75 kg 76.3 kg       Telemetry    Atrial fib- Personally Reviewed   Physical Exam   General: Well developed, well nourished, NAD  HEENT: OP clear, mucus membranes moist  SKIN: warm, dry. No rashes. Neuro: No focal deficits  Musculoskeletal: Muscle strength 5/5 all ext  Psychiatric: Mood and affect normal  Neck: No JVD Lungs:Clear bilaterally, no wheezes, rhonci, crackles Cardiovascular: Irreg irreg. No murmurs.  Abdomen:Soft.  Extremities: No lower extremity edema  Labs    High Sensitivity Troponin:   Recent Labs  Lab 09/12/22 2330  TROPONINIHS 40*     Chemistry Recent Labs  Lab 09/12/22 2330 09/13/22 0141 09/14/22 0243 09/15/22 0040 09/16/22 0113  NA 139  138   < > 133* 133* 135  K 4.1  4.1   < > 4.5 4.7 4.0  CL 95*  99   < > 92* 94* 91*  CO2 25   < > 25 27 26   GLUCOSE 104*  102*   < > 117* 90 102*  BUN 30*  38*   < > 55* 37* 27*  CREATININE 6.06*  6.20*   < > 8.09* 6.19* 4.83*  CALCIUM 9.3   < > 9.2 9.2 9.4  PROT 8.6*  --   --   --   --  ALBUMIN 3.5  --   --   --   --   AST 26  --   --   --   --   ALT 22  --   --   --   --   ALKPHOS 77  --   --   --   --   BILITOT 0.6  --   --   --   --   GFRNONAA 8*   < > 6* 8* 11*  ANIONGAP 19*   < > 16* 12 18*   < > = values in this interval not displayed.    Lipids  Recent Labs  Lab 09/14/22 0243  CHOL 107  TRIG 35  HDL 36*  LDLCALC 64  CHOLHDL 3.0    Hematology Recent Labs  Lab 09/14/22 0243 09/15/22 0040 09/16/22 0113  WBC 7.8 6.6 7.0  RBC 4.55 4.43 4.47  HGB 11.9* 11.5* 11.7*  HCT 38.1 37.3 38.3  MCV 83.7 84.2 85.7  MCH 26.2 26.0 26.2  MCHC 31.2 30.8 30.5  RDW 18.6* 18.5* 18.3*  PLT 147* 153 172   Thyroid  Recent Labs  Lab 09/14/22 0243  TSH 0.954    BNP Recent Labs  Lab 09/14/22 0243  BNP 1,704.8*    DDimer No results for input(s): "DDIMER" in the last 168 hours.   Radiology    No results found.  Cardiac Studies     Patient Profile     47 y.o. female with ESRD, chronic hypoxic  respiratory failure (thought to be related to extra volume), hypertension, diastolic heart failure, seizures who was admitted on 09/13/2022 for anterior STEMI.   Assessment & Plan    CAD/Anterior STEMI: Admitted 09/13/22 with anterior STEMI. She is felt to have had thromboembolic event to the coronaries. Also found to have atrial fibrillation which could explain source for the cardioembolic event. LE venous dopplers with no evidence of DVT. Angioplasty performed in the OM3 and distal LAD with no stent placement.  Poor flow into the distal LAD following PCI. Echo with LVEF=35-40%.  She remains on Plavix and has been started on Eliquis. (ASA stopped yesterday)  Continue statin   Ischemic cardiomyopathy: LVEF=35-40% on post MI echo. Volume removal with HD.  Continue beta blocker. Blood pressure will not allow for addition of  GDMT which is also going to be limited by the presence of end-stage renal disease.  Atrial fibrillation: Limited options for rate control given soft BP. Will continue metoprolol and Eliquis.    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 No further eval indicated at this time. No clinical signs of endocarditis. In a dialysis patient, high likelihood this is mobile MAC.   HTN: BP stable   ESRD: HD on Tuesday Thursday Saturday.  She received dialysis yesterday   Chronic respiratory failure: Thought to be related to volume overload.  Nephrology managing fluid status.    Dispo: Anticipate discharge home tomorrow after dialysis. She is asked to ambulate today.    For questions or updates, please contact [facility] Please consult [email or web address] for contact info under   Signed, Verne Carrow, MD  09/16/2022, 9:09 AM

## 2022-09-17 ENCOUNTER — Telehealth: Payer: Self-pay | Admitting: Physician Assistant

## 2022-09-17 DIAGNOSIS — I071 Rheumatic tricuspid insufficiency: Secondary | ICD-10-CM | POA: Insufficient documentation

## 2022-09-17 DIAGNOSIS — I10 Essential (primary) hypertension: Secondary | ICD-10-CM

## 2022-09-17 DIAGNOSIS — I3139 Other pericardial effusion (noninflammatory): Secondary | ICD-10-CM | POA: Insufficient documentation

## 2022-09-17 DIAGNOSIS — Z7901 Long term (current) use of anticoagulants: Secondary | ICD-10-CM

## 2022-09-17 DIAGNOSIS — I5023 Acute on chronic systolic (congestive) heart failure: Secondary | ICD-10-CM | POA: Insufficient documentation

## 2022-09-17 DIAGNOSIS — I272 Pulmonary hypertension, unspecified: Secondary | ICD-10-CM | POA: Insufficient documentation

## 2022-09-17 DIAGNOSIS — I5021 Acute systolic (congestive) heart failure: Secondary | ICD-10-CM | POA: Insufficient documentation

## 2022-09-17 DIAGNOSIS — I340 Nonrheumatic mitral (valve) insufficiency: Secondary | ICD-10-CM | POA: Insufficient documentation

## 2022-09-17 DIAGNOSIS — I058 Other rheumatic mitral valve diseases: Secondary | ICD-10-CM | POA: Insufficient documentation

## 2022-09-17 LAB — RENAL FUNCTION PANEL
Albumin: 2.8 g/dL — ABNORMAL LOW (ref 3.5–5.0)
Anion gap: 14 (ref 5–15)
BUN: 51 mg/dL — ABNORMAL HIGH (ref 6–20)
CO2: 26 mmol/L (ref 22–32)
Calcium: 9 mg/dL (ref 8.9–10.3)
Chloride: 94 mmol/L — ABNORMAL LOW (ref 98–111)
Creatinine, Ser: 7.52 mg/dL — ABNORMAL HIGH (ref 0.44–1.00)
GFR, Estimated: 6 mL/min — ABNORMAL LOW (ref >60)
Glucose, Bld: 112 mg/dL — ABNORMAL HIGH (ref 70–99)
Phosphorus: 5.4 mg/dL — ABNORMAL HIGH (ref 2.5–4.6)
Potassium: 4.1 mmol/L (ref 3.5–5.1)
Sodium: 134 mmol/L — ABNORMAL LOW (ref 135–145)

## 2022-09-17 LAB — CBC
HCT: 33.9 % — ABNORMAL LOW (ref 36.0–46.0)
Hemoglobin: 10.4 g/dL — ABNORMAL LOW (ref 12.0–15.0)
MCH: 25.7 pg — ABNORMAL LOW (ref 26.0–34.0)
MCHC: 30.7 g/dL (ref 30.0–36.0)
MCV: 83.7 fL (ref 80.0–100.0)
Platelets: 177 10*3/uL (ref 150–400)
RBC: 4.05 MIL/uL (ref 3.87–5.11)
RDW: 18.3 % — ABNORMAL HIGH (ref 11.5–15.5)
WBC: 6.2 10*3/uL (ref 4.0–10.5)
nRBC: 0 % (ref 0.0–0.2)

## 2022-09-17 MED ORDER — ISOSORBIDE MONONITRATE ER 30 MG PO TB24: 30.0000 mg | ORAL_TABLET | Freq: Every day | ORAL | 5 refills | Status: DC

## 2022-09-17 MED ORDER — ANTICOAGULANT SODIUM CITRATE 4% (200MG/5ML) IV SOLN: 5.0000 mL | Status: DC | PRN

## 2022-09-17 MED ORDER — CINACALCET HCL 30 MG PO TABS: 60.0000 mg | ORAL_TABLET | Freq: Every day | ORAL | 1 refills | Status: DC

## 2022-09-17 MED ORDER — PENTAFLUOROPROP-TETRAFLUOROETH EX AERO: 1.0000 | INHALATION_SPRAY | CUTANEOUS | Status: DC | PRN

## 2022-09-17 MED ORDER — ATORVASTATIN CALCIUM 80 MG PO TABS: 80.0000 mg | ORAL_TABLET | Freq: Every day | ORAL | 5 refills | Status: DC

## 2022-09-17 MED ORDER — FERRIC CITRATE 1 GM 210 MG(FE) PO TABS: 210.0000 mg | ORAL_TABLET | Freq: Three times a day (TID) | ORAL | 1 refills | Status: DC

## 2022-09-17 MED ORDER — METOPROLOL TARTRATE 25 MG PO TABS: 12.5000 mg | ORAL_TABLET | Freq: Two times a day (BID) | ORAL | 5 refills | Status: DC

## 2022-09-17 MED ORDER — LIDOCAINE HCL (PF) 1 % IJ SOLN: 5.0000 mL | INTRAMUSCULAR | Status: DC | PRN

## 2022-09-17 MED ORDER — HEPARIN SODIUM (PORCINE) 1000 UNIT/ML DIALYSIS: 1000.0000 [IU] | INTRAMUSCULAR | Status: DC | PRN

## 2022-09-17 MED ORDER — ALTEPLASE 2 MG IJ SOLR: 2.0000 mg | Freq: Once | INTRAMUSCULAR | Status: DC | PRN

## 2022-09-17 MED ORDER — PANTOPRAZOLE SODIUM 40 MG PO TBEC: 40.0000 mg | DELAYED_RELEASE_TABLET | Freq: Every day | ORAL | 3 refills | Status: DC

## 2022-09-17 MED ORDER — LIDOCAINE-PRILOCAINE 2.5-2.5 % EX CREA: 1.0000 | TOPICAL_CREAM | CUTANEOUS | Status: DC | PRN

## 2022-09-17 NOTE — Progress Notes (Addendum)
Rounding Note    Patient Name: Desiree [name] Date of Encounter: 09/17/2022  [facility] Cardiologist: Jodelle Red, MD   Subjective   Feels well this morning.  Walked to the bathroom without difficulty.  Excited to go home later today after dialysis. No chest pain.   Inpatient Medications    Scheduled Meds:  apixaban  5 mg Oral BID   atorvastatin  80 mg Oral Daily   cinacalcet  60 mg Oral Q supper   clopidogrel  75 mg Oral Q breakfast   doxercalciferol  4 mcg Intravenous Q T,Th,Sa-HD   feeding supplement (NEPRO CARB STEADY)  237 mL Oral BID BM   ferric citrate  210 mg Oral TID WC   fluticasone furoate-vilanterol  1 puff Inhalation Daily   hydrALAZINE  25 mg Oral BID   isosorbide mononitrate  30 mg Oral Daily   metoprolol tartrate  12.5 mg Oral BID   mupirocin ointment  1 Application Topical BID   pantoprazole  20 mg Oral Daily   sevelamer carbonate  1,600 mg Oral TID WC   sodium chloride flush  3 mL Intravenous Q12H   Continuous Infusions:  sodium chloride Stopped (09/13/22 1058)   anticoagulant sodium citrate     PRN Meds: sodium chloride, acetaminophen, ALPRAZolam, alteplase, anticoagulant sodium citrate, calcium carbonate, cyclobenzaprine, diphenhydrAMINE, heparin, hydrALAZINE, HYDROmorphone (DILAUDID) injection, ipratropium-albuterol, lactulose, lidocaine (PF), lidocaine (PF), lidocaine-prilocaine, lidocaine-prilocaine, nitroGLYCERIN, ondansetron, oxyCODONE, pentafluoroprop-tetrafluoroeth, pentafluoroprop-tetrafluoroeth, sodium chloride flush   Vital Signs    Vitals:   09/16/22 2034 09/16/22 2200 09/16/22 2301 09/17/22 0331  BP: 101/63 (!) 107/52 (!) 105/56 104/68  Pulse: 94 85 98 77  Resp: 16  18 18   Temp: 98.3 F (36.8 C)  99.5 F (37.5 C) 97.8 F (36.6 C)  TempSrc: Oral  Oral Oral  SpO2: 97% 99% 96% 97%  Weight:      Height:        Intake/Output Summary (Last 24 hours) at 09/17/2022 0739 Last data filed at 09/16/2022 1145 Gross per  24 hour  Intake 246 ml  Output --  Net 246 ml      09/15/2022    3:00 PM 09/15/2022    1:18 PM 09/15/2022    9:14 AM  Last 3 Weights  Weight (lbs) 157 lb 8 oz 165 lb 5.5 oz 168 lb 3.4 oz  Weight (kg) 71.442 kg 75 kg 76.3 kg      Telemetry    AFib, rate controlled - Personally Reviewed  ECG    None recent - Personally Reviewed  Physical Exam   GEN: No acute distress.   Neck: No JVD Cardiac: irregularly irregular, no murmurs, rubs, or gallops.  Respiratory: Clear to auscultation bilaterally. GI: Soft, nontender, non-distended  MS: No edema; No deformity. Neuro:  Nonfocal  Psych: Normal affect   Labs    High Sensitivity Troponin:   Recent Labs  Lab 09/12/22 2330  TROPONINIHS 40*     Chemistry Recent Labs  Lab 09/12/22 2330 09/13/22 0141 09/14/22 0243 09/15/22 0040 09/16/22 0113  NA 139  138   < > 133* 133* 135  K 4.1  4.1   < > 4.5 4.7 4.0  CL 95*  99   < > 92* 94* 91*  CO2 25   < > 25 27 26   GLUCOSE 104*  102*   < > 117* 90 102*  BUN 30*  38*   < > 55* 37* 27*  CREATININE 6.06*  6.20*   < >  8.09* 6.19* 4.83*  CALCIUM 9.3   < > 9.2 9.2 9.4  PROT 8.6*  --   --   --   --   ALBUMIN 3.5  --   --   --   --   AST 26  --   --   --   --   ALT 22  --   --   --   --   ALKPHOS 77  --   --   --   --   BILITOT 0.6  --   --   --   --   GFRNONAA 8*   < > 6* 8* 11*  ANIONGAP 19*   < > 16* 12 18*   < > = values in this interval not displayed.    Lipids  Recent Labs  Lab 09/14/22 0243  CHOL 107  TRIG 35  HDL 36*  LDLCALC 64  CHOLHDL 3.0    Hematology Recent Labs  Lab 09/14/22 0243 09/15/22 0040 09/16/22 0113  WBC 7.8 6.6 7.0  RBC 4.55 4.43 4.47  HGB 11.9* 11.5* 11.7*  HCT 38.1 37.3 38.3  MCV 83.7 84.2 85.7  MCH 26.2 26.0 26.2  MCHC 31.2 30.8 30.5  RDW 18.6* 18.5* 18.3*  PLT 147* 153 172   Thyroid  Recent Labs  Lab 09/14/22 0243  TSH 0.954    BNP Recent Labs  Lab 09/14/22 0243  BNP 1,704.8*    DDimer No results for input(s): "DDIMER"  in the last 168 hours.   Radiology    No results found.  Cardiac Studies   Cath results nad labs reviewed  Patient Profile     47 y.o. female with MI due to thromboembolic event in the setting of AFib  Assessment & Plan    CAD/acute anteroapical MI: Thromboembolic event to her coronaries, likely due to thrombus from previously undetected atrial fibrillation.  She had occlusions of the distal OM 3 and the apical LAD.  Angioplasty was done in both areas but it was difficult to restore significant flow.  Patient has been medically managed.  EF now in the 35 to 40% range.  Plan is for Eliquis for stroke prevention and Plavix for antiplatelet therapy.  No aspirin to reduce bleeding risk. Statin was started since she is post MI but the patient does not have atherosclerotic CAD.   Acute systolic heart failure/ischemic cardiomyopathy: LVEF reduced.  Hopefully, this will improve with time.  Her blood pressure does not allow for additional medical therapy for LV dysfunction.  Atrial fibrillation: Not a candidate for rhythm modification given recent thromboembolic event.  Rate control and anticoagulation.  Low dose metoprolol for rate control.   Hypertension: Stable.  BP has ben borderline low at times.   End-stage renal disease: Hemodialysis on Tuesday Thursday and Saturday.  Hopeful for discharge later today after dialysis.  Chronic respiratory failure: She is chronically on supplemental oxygen at home, 3 L [place].  Breathing stable this morning.   Plan for discharge today after dialysis. Full discharge summary to follow.  For questions or updates, please contact [facility] Please consult [email or web address] for contact info under        Signed, Lance Muss, MD  09/17/2022, 7:39 AM

## 2022-09-17 NOTE — Plan of Care (Signed)
[facility] Patient Discharge Orders - [facility]: AF  Patient's name: Desiree [name] Admit/DC Dates: 09/12/2022 - 09/17/2022  DISCHARGE DIAGNOSES: Anterior wall STEMI - > sp LHC w/ distal LAD and distal 3rd marg LCx, s/p PTA (no stent), felt embolic.  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 Acute Hypoxic Resp failure Pulmonary HTN New HFrEF -> started BB + isosorbide  HD ORDER CHANGES: Heparin change: yes -> reduce to 5000 unit bolus (since now on Eliquis + Plavix) EDW Change: yes New EDW: 72kg Bath Change: no, but pls check weekly K for 1 month  ANEMIA MANAGEMENT: Aranesp: Given: no  ESA dose for discharge: none to start, per protocol IV Iron dose at discharge: none to start, per protocol Transfusion: Given: no  BONE/MINERAL MEDICATIONS: Hectorol/Calcitriol change: no Sensipar/Parsabiv change: no  ACCESS INTERVENTION/CHANGE: no Details:   RECENT LABS: Recent Labs  Lab 09/17/22 0830  HGB 10.4*  NA 134*  K 4.1  CALCIUM 9.0  PHOS 5.4*  ALBUMIN 2.8*    IV ANTIBIOTICS: no Details:  OTHER ANTICOAGULATION: On Coumadin?: no --> STARTED ON ELIQUIS  OTHER/APPTS/LAB ORDERS:  1) Check K weekly for 1 mo  2)  Now on Plavix + Eliquis  D/C Meds to be reconciled by nurse after every discharge.  Completed By: Ozzie Hoyle, PA-C [facility] Pager [phone]   Reviewed by: MD:______ RN_______

## 2022-09-17 NOTE — Progress Notes (Signed)
    Durable Medical Equipment  (From admission, onward)           Start     Ordered   09/17/22 0955  For home use only DME oxygen  Once       Question Answer Comment  Length of Need 12 Months   Mode or (Route) Nasal cannula   Liters per Minute 3   Frequency Continuous (stationary and portable oxygen unit needed)   Oxygen conserving device Yes   Oxygen delivery system Gas      09/17/22 0954   09/17/22 0953  For home use only DME Walker rolling  Once       Question Answer Comment  Walker: With 5 Inch Wheels   Patient needs a walker to treat with the following condition Fx      09/17/22 0954

## 2022-09-17 NOTE — Discharge Summary (Addendum)
Discharge Summary    Patient ID: Desiree [name] MRN: [id number]; DOB: 1975/05/06  Admit date: 09/12/2022 Discharge date: 09/17/2022  PCP:  Jearld Lesch, MD   [facility] Providers Cardiologist:  Jodelle Red, MD        Discharge Diagnoses    Principal Problem:   Anteroapical myocardial infarction [facility]) Active Problems:   ESRD (end stage renal disease) on dialysis [facility])   Chronic respiratory failure with hypoxia (HCC)   Hypertension   Persistent atrial fibrillation (HCC)   Cardiomyopathy, ischemic   Mitral valve mass   Acute systolic heart failure (HCC)   Pulmonary hypertension, unspecified (HCC)   Pericardial effusion   Nonrheumatic mitral valve regurgitation   Tricuspid regurgitation    Diagnostic Studies/Procedures    2D Echo 09/13/22  1. Left ventricular ejection fraction, by estimation, is 35 to 40%. The  left ventricle has moderately decreased function. The left ventricle  demonstrates regional wall motion abnormalities (see scoring  diagram/findings for description). Left ventricular   diastolic parameters are indeterminate.   2. Right ventricular systolic function is normal. The right ventricular  size is normal. There is severely elevated pulmonary artery systolic  pressure.   3. A small pericardial effusion is present. The pericardial effusion is  circumferential.   4. The mitral valve is abnormal. Mild mitral valve regurgitation. No  evidence of mitral stenosis. Severe mitral annular calcification (MAC).  There is a mobile echodensity measuring 1.7 x 0.45 cm that is attached to  the atrial surface of the posterior  mitral valve annulus. Seen in prior echo images. Differential diagnosis  include mobile MAC, calcified thrombus, calcified vegetation. 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 Clinical correlation is recommended. Consider TEE  for further evaluation.   5. The tricuspid valve is abnormal. Tricuspid valve regurgitation is   moderate to severe.   6. The aortic valve was not well visualized. Aortic valve regurgitation  is not visualized. No aortic stenosis is present.   7. The inferior vena cava is dilated in size with <50% respiratory  variability, suggesting right atrial pressure of 15 mmHg.   8. Agitated saline contrast bubble study was negative, with no evidence  of any interatrial shunt.   Comparison(s): Changes from prior study are noted. LVEF worsened to 35-40%  with RWMA.   LE Duplex 09/13/22 Summary:  BILATERAL:  - No evidence of deep vein thrombosis seen in the lower extremities,  bilaterally.  -No evidence of popliteal cyst, bilaterally.    Cath 09/13/22   Lat 3rd Mrg lesion is 100% stenosed.  Balloon angioplasty was performed using a BALLN EMERGE MR 2.0X12.   Post intervention, there is a 0% residual stenosis.   Dist LAD lesion is 100% stenosed.  Balloon angioplasty was performed using a BALLN EMERGE MR 2.0X12.   Post intervention, there is a 100% residual stenosis.   Mid Cx lesion is 50% stenosed.  Thrombotic.   LV end diastolic pressure is moderately elevated.   Recommend to resume Apixaban, at currently prescribed dose and frequency.   Recommend concurrent antiplatelet therapy of Clopidogrel 75mg  daily for 12 months.   No atherosclerotic coronary artery disease.  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  Angioplasty done in both areas but neither area receiving TIMI-3 flow.  Resume heparin 6 hours after femoral sheath removal.  She will need echocardiogram to look for source of embolism.  Would consider long-term  oral anticoagulation given her presentation here today.  Could also use clopidogrel for antiplatelet therapy depending on whether or not she has bleeding issues.  Will watch in 2 heart. I spoke to her daughter, Mel Almond, and conveyed the results.     _____________   History of Present Illness     Desiree Dorris Carnes  [name] is a 47 y.o. female with ESRD T/T/S, HTN, seizures, migraine, gout, anemia, anxiety, childhood asthma, GERD, chronic respiratory failure on 3L home O2 of unclear etiology (sees pulmonary) who presented to the hospital with acute anterior STEMI. On day of admission she developed severe crushing chest pain associated with n/v and SOB. She had dialysis that day. EMS was called, found to have anterior ST elevations hence code STEMI was activated. The pain was not relieved by nitroglycerin. She has a contrast allergy so was given steroids. In the ED, BP was 150s. Bedside echo showed no effusion. She was taken emergently to the cardiac cath lab for further evaluation.  Hospital Course     1. CAD/acute anteroapical MI: - cath 09/13/22 showed findings suspected to be related to thromboembolic event to her coronaries, likely due to thrombus from previously undetected atrial fibrillation.  She had occlusions of the distal OM 3 and the apical LAD.  Angioplasty was done in both areas but it was difficult to restore significant flow.  Patient has been medically managed. EF now in the 35 to 40% range. Plan is for Eliquis for stroke prevention and Plavix for antiplatelet therapy. No aspirin to reduce bleeding risk. Statin was started since she is post MI but the patient does not have atherosclerotic CAD. Can review plan for f/u lipids/LFTs as outpatient - LE venous duplex negative for DVT - omeprazole changed to pantoprazole given new Plavix rx  - held off prescribing SL NTG at DC given tendency for soft BP - she was referred to CRP2 but per cardiac rehab tech likely not appropriate in her current condition for OP cardiac rehab  2. Acute systolic heart failure/ischemic cardiomyopathy, severe pulm HTN, mild MR, mod-severe TR, small pericardial effusion - 2D echo showed EF 35-40%, normal RV, severely elevated PASP, small pericardial effusion, mild mitral regurgitation, mobile echodensity on mitral valve (see  below), moderate-severe TR, dilated IVC - started on low dose BB, hydralazine, Imdur this admission - however, her blood pressure did not appear to support aggressive GDMT. Per med review with DOD Dr. Ladona Ridgel at discharge, hydralazine has been intermittently held due to hypotension so we will hold off prescribing at discharge; will rx Imdur and metoprolol per our discussion - Dr. Eldridge Dace was OK with continuing her prior to admission PRN midodrine - her blood pressure does not allow for additional medical therapy for LV dysfunction (not on ACE/ARB/ARNI/spiro due to soft BP + ESRD)   3. Newly recognized atrial fibrillation - not a candidate for rhythm modification given recent thromboembolic event, planned for rate control and anticoagulation - will go home on low dose metoprolol, rate controlled  4. Mitral valve mass: - per Dr. 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 No further eval indicated at this time. No clinical signs of endocarditis. 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 Hypertension:  - Stable in present regimen. BP has been borderline low at times. Med plan as above. Dr. Eldridge Dace gave OK to continue PTA PRN midodrine at DC   6. End-stage renal disease - she is on hemodialysis on Tuesday Thursday and Saturday - I spoke with  Dr. Arrie Aran this morning, they will assist with making sure HD continued as OP. Confirmed DC meds with him, to send home on Auryxia 210mg  TIDWC, Sensispar 60mg  daily with supper, Renvela 1600mg  TID  7. Acute on chronic respiratory failure - required escalation of [place] O2 earlier in admission in setting of suspected volume overload, requiring extra HD - per care manager's confirmation and discussionw with nurse, she is stable on 2L O2 which is her home regimen (rather than 3L) therefore will continue at DC. Breathing stable this morning.   Dr. Eldridge Dace has seen and examined the patient today and feels she is stable for discharge. I  have sent a message to our office's scheduling team requesting a TOC f/u appointment within 7-10 days and our office will call the patient with this information.  Plavix + Eliquis were sent to [facility] pharmacy yesterday (which is closed today) so that they could be delivered to bedside prior to DC. Her remaining new prescriptions were sent to [facility] by patient (the only [facility] open in [place] today per report).     Did the patient have an acute coronary syndrome (MI, NSTEMI, STEMI, etc) this admission?:  Yes                               AHA/ACC Clinical Performance & Quality Measures: Aspirin prescribed? - No - bleeding risk ADP Receptor Inhibitor (Plavix/Clopidogrel, Brilinta/Ticagrelor or Effient/Prasugrel) prescribed (includes medically managed patients)? - Yes Beta Blocker prescribed? - Yes High Intensity Statin (Lipitor 40-80mg  or Crestor 20-40mg ) prescribed? - Yes EF assessed during THIS hospitalization? - Yes For EF <40%, was ACEI/ARB prescribed? - No - Reason:  hypotension For EF <40%, Aldosterone Antagonist (Spironolactone or Eplerenone) prescribed? - No - Reason:  hypotension Cardiac Rehab Phase II ordered (including medically managed patients)? - Yes but per cardiac rehab team she would not be appropriate currently in her physical state      The patient will be scheduled for a TOC follow up appointment in 7-10 days.  A message has been sent to the [facility] (via phone note) and Scheduling Pool (via staff msg) at the office where the patient should be seen for follow up.  _____________  Discharge Vitals Blood pressure 97/68, pulse 97, temperature 98.8 F (37.1 C), resp. rate (!) 21, height 5\' 4"  (1.626 m), weight 73.7 kg, SpO2 96 %.  Filed Weights   09/15/22 1318 09/15/22 1500 09/17/22 0830  Weight: 75 kg 71.4 kg 73.7 kg    Labs & Radiologic Studies    CBC Recent Labs    09/16/22 0113 09/17/22 0830  WBC 7.0 6.2  HGB 11.7* 10.4*  HCT 38.3 33.9*   MCV 85.7 83.7  PLT 172 177   Basic Metabolic Panel Recent Labs    16/10/96 0040 09/16/22 0113 09/17/22 0830  NA 133* 135 134*  K 4.7 4.0 4.1  CL 94* 91* 94*  CO2 27 26 26   GLUCOSE 90 102* 112*  BUN 37* 27* 51*  CREATININE 6.19* 4.83* 7.52*  CALCIUM 9.2 9.4 9.0  PHOS 6.0*  --  5.4*   Liver Function Tests Recent Labs    09/17/22 0830  ALBUMIN 2.8*   No results for input(s): "LIPASE", "AMYLASE" in the last 72 hours. High Sensitivity Troponin:   Recent Labs  Lab 09/12/22 2330  TROPONINIHS 40*    _____________  ECHOCARDIOGRAM COMPLETE BUBBLE STUDY  Result Date: 09/13/2022    ECHOCARDIOGRAM  REPORT   Patient Name:   Desiree [name] Date of Exam: 09/13/2022 Medical Rec #:  [id number]       Height:       64.0 in Accession #:    [id number]      Weight:       168.2 lb Date of Birth:  [birth year]        BSA:          1.818 m Patient Age:    47 years        BP:           126/85 mmHg Patient Gender: F               HR:           86 bpm. Exam Location:  Inpatient Procedure: 2D Echo, Color Doppler, Cardiac Doppler and Saline Contrast Bubble            Study Indications:    acute myocardial infarction  History:        Patient has prior history of Echocardiogram examinations, most                 recent 01/05/2022. CHF, End stage renal disease,                 Arrythmias:Atrial Fibrillation, Signs/Symptoms:Chest Pain; Risk                 Factors:Hypertension and cocaine use.  Sonographer:    Delcie Roch RDCS Referring Phys: 83 Emmanuela Ghazi S Flor Whitacre IMPRESSIONS  1. Left ventricular ejection fraction, by estimation, is 35 to 40%. The left ventricle has moderately decreased function. The left ventricle demonstrates regional wall motion abnormalities (see scoring diagram/findings for description). Left ventricular  diastolic parameters are indeterminate.  2. Right ventricular systolic function is normal. The right ventricular size is normal. There is severely elevated pulmonary artery systolic  pressure.  3. A small pericardial effusion is present. The pericardial effusion is circumferential.  4. The mitral valve is abnormal. Mild mitral valve regurgitation. No evidence of mitral stenosis. Severe mitral annular calcification (MAC). There is a mobile echodensity measuring 1.7 x 0.45 cm that is attached to the atrial surface of the posterior mitral valve annulus. Seen in prior echo images. Differential diagnosis include mobile MAC, calcified thrombus, calcified vegetation. 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 Clinical correlation is recommended. Consider TEE for further evaluation.  5. The tricuspid valve is abnormal. Tricuspid valve regurgitation is moderate to severe.  6. The aortic valve was not well visualized. Aortic valve regurgitation is not visualized. No aortic stenosis is present.  7. The inferior vena cava is dilated in size with <50% respiratory variability, suggesting right atrial pressure of 15 mmHg.  8. Agitated saline contrast bubble study was negative, with no evidence of any interatrial shunt. Comparison(s): Changes from prior study are noted. LVEF worsened to 35-40% with RWMA. FINDINGS  Left Ventricle: Left ventricular ejection fraction, by estimation, is 35 to 40%. The left ventricle has moderately decreased function. The left ventricle demonstrates regional wall motion abnormalities. The left ventricular internal cavity size was normal in size. There is no left ventricular hypertrophy. Left ventricular diastolic parameters are indeterminate.  LV Wall Scoring: The apical lateral segment, apical inferior segment, and apex are akinetic. The entire anterior wall, mid and distal anterior septum, inferior septum, and posterior wall are hypokinetic. The antero-lateral wall, inferior wall, and basal anteroseptal segment are normal. Right Ventricle: The right ventricular size is normal. No  increase in right ventricular wall thickness. Right ventricular systolic function is normal. There is severely  elevated pulmonary artery systolic pressure. The tricuspid regurgitant velocity is 3.38 m/s, and with an assumed right atrial pressure of 15 mmHg, the estimated right ventricular systolic pressure is 60.7 mmHg. Left Atrium: Left atrial size was normal in size. Right Atrium: Right atrial size was normal in size. Pericardium: A small pericardial effusion is present. The pericardial effusion is circumferential. Mitral Valve: The mitral valve is abnormal. Severe mitral annular calcification. Mild mitral valve regurgitation. No evidence of mitral valve stenosis. MV peak gradient, 14.1 mmHg. The mean mitral valve gradient is 4.3 mmHg. Tricuspid Valve: The tricuspid valve is abnormal. Tricuspid valve regurgitation is moderate to severe. No evidence of tricuspid stenosis. Aortic Valve: The aortic valve was not well visualized. There is moderate aortic valve annular calcification. Aortic valve regurgitation is not visualized. No aortic stenosis is present. Pulmonic Valve: The pulmonic valve was normal in structure. Pulmonic valve regurgitation is mild. No evidence of pulmonic stenosis. Aorta: The aortic root and ascending aorta are structurally normal, with no evidence of dilitation. Venous: The inferior vena cava is dilated in size with less than 50% respiratory variability, suggesting right atrial pressure of 15 mmHg. IAS/Shunts: No atrial level shunt detected by color flow Doppler. Agitated saline contrast was given intravenously to evaluate for intracardiac shunting. Agitated saline contrast bubble study was negative, with no evidence of any interatrial shunt.  LEFT VENTRICLE PLAX 2D LVIDd:         4.70 cm LVIDs:         3.00 cm LV PW:         0.90 cm LV IVS:        0.80 cm LVOT diam:     1.60 cm LV SV:         38 LV SV Index:   21 LVOT Area:     2.01 cm  RIGHT VENTRICLE             IVC RV Basal diam:  2.70 cm     IVC diam: 2.30 cm RV S prime:     11.70 cm/s TAPSE (M-mode): 1.3 cm LEFT ATRIUM             Index         RIGHT ATRIUM           Index LA diam:        4.10 cm 2.26 cm/m   RA Area:     13.50 cm LA Vol (A2C):   70.9 ml 39.01 ml/m  RA Volume:   29.10 ml  16.01 ml/m LA Vol (A4C):   51.0 ml 28.06 ml/m LA Biplane Vol: 61.3 ml 33.73 ml/m  AORTIC VALVE LVOT Vmax:   110.00 cm/s LVOT Vmean:  71.933 cm/s LVOT VTI:    0.190 m  AORTA Ao Root diam: 2.70 cm Ao Asc diam:  3.00 cm MITRAL VALVE             TRICUSPID VALVE MV Area VTI:  1.06 cm   TR Peak grad:   45.7 mmHg MV Peak grad: 14.1 mmHg  TR Vmax:        338.00 cm/s MV Mean grad: 4.3 mmHg MV Vmax:      1.88 m/s   SHUNTS MV Vmean:     89.1 cm/s  Systemic VTI:  0.19 m  Systemic Diam: 1.60 cm Vishnu Priya Mallipeddi Electronically signed by Winfield Rast Mallipeddi Signature Date/Time: 09/13/2022/7:35:41 PM    Final    VAS [place] LOWER EXTREMITY VENOUS (DVT)  Result Date: 09/13/2022  Lower Venous DVT Study Patient Name:  Desiree [name]  Date of Exam:   09/13/2022 Medical Rec #: [id number]        Accession #:    [id number] Date of Birth: [birth year]         Patient Gender: F Patient Age:   57 years Exam Location:  [facility] Procedure:      VAS [place] LOWER EXTREMITY VENOUS (DVT) Referring Phys: Gerri Spore O'NEAL --------------------------------------------------------------------------------  Indications: Coronary embolism.  Limitations: Pain with compression, status post right femoral cath. Comparison Study: Prior negative bilateral LEV done 01/14/21 Performing Technologist: Sherren Kerns RVS  Examination Guidelines: A complete evaluation includes B-mode imaging, spectral Doppler, color Doppler, and power Doppler as needed of all accessible portions of each vessel. Bilateral testing is considered an integral part of a complete examination. Limited examinations for reoccurring indications may be performed as noted. The reflux portion of the exam is performed with the patient in reverse Trendelenburg.   +---------+---------------+---------+-----------+---------------+--------------+ RIGHT    CompressibilityPhasicitySpontaneityProperties     Thrombus Aging +---------+---------------+---------+-----------+---------------+--------------+ CFV                     Yes      Yes        pulsatile      patent by                                                  waveforms      color and                                                                 Doppler        +---------+---------------+---------+-----------+---------------+--------------+ FV Prox  Full           Yes      Yes        pulsatile                                                                 waveforms                     +---------+---------------+---------+-----------+---------------+--------------+ FV Mid   Full                                                             +---------+---------------+---------+-----------+---------------+--------------+ FV DistalFull                                                             +---------+---------------+---------+-----------+---------------+--------------+  PFV      Full                                                             +---------+---------------+---------+-----------+---------------+--------------+ POP                     Yes      Yes        pulsatile      patent by                                                  waveforms      color and                                                                 Doppler        +---------+---------------+---------+-----------+---------------+--------------+ PTV      Full                                                             +---------+---------------+---------+-----------+---------------+--------------+ PERO     Full                                                             +---------+---------------+---------+-----------+---------------+--------------+    +---------+---------------+---------+-----------+---------------+--------------+ LEFT     CompressibilityPhasicitySpontaneityProperties     Thrombus Aging +---------+---------------+---------+-----------+---------------+--------------+ CFV                                         pulsatile      patent by                                                  waveforms      color and                                                                 Doppler        +---------+---------------+---------+-----------+---------------+--------------+ FV Prox  pulsatile      patent by                                                  waveforms      color and                                                                 Doppler        +---------+---------------+---------+-----------+---------------+--------------+ FV Mid                                      pulsatile      patent by                                                  waveforms      color and                                                                 Doppler        +---------+---------------+---------+-----------+---------------+--------------+ FV Distal                                   pulsatile      patent by                                                  waveforms      color and                                                                 Doppler        +---------+---------------+---------+-----------+---------------+--------------+ PFV                                         pulsatile      patent by                                                  waveforms      color and  Doppler        +---------+---------------+---------+-----------+---------------+--------------+ POP                                         pulsatile      patent by                                                   waveforms      color and                                                                 Doppler        +---------+---------------+---------+-----------+---------------+--------------+ PTV      Full                                                             +---------+---------------+---------+-----------+---------------+--------------+ PERO     Full                                                             +---------+---------------+---------+-----------+---------------+--------------+     Summary: BILATERAL: - No evidence of deep vein thrombosis seen in the lower extremities, bilaterally. -No evidence of popliteal cyst, bilaterally.   *See table(s) above for measurements and observations. Electronically signed by Coral Else MD on 09/13/2022 at 7:00:56 PM.    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  Balloon angioplasty was performed using a BALLN EMERGE MR 2.0X12.   Post intervention, there is a 0% residual stenosis.   Dist LAD lesion is 100% stenosed.  Balloon angioplasty was performed using a BALLN EMERGE MR 2.0X12.   Post intervention, there is a 100% residual stenosis.   Mid Cx lesion is 50% stenosed.  Thrombotic.   LV end diastolic pressure is moderately elevated.   Recommend to resume Apixaban, at currently prescribed dose and frequency.   Recommend concurrent antiplatelet therapy of Clopidogrel 75mg  daily for 12 months. No atherosclerotic coronary artery disease.  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  Angioplasty done in both areas but neither area receiving TIMI-3 flow.  Resume heparin 6 hours after femoral sheath removal.  She will need echocardiogram to look for source of embolism.  Would consider long-term oral anticoagulation given her presentation here today.  Could also use clopidogrel  for antiplatelet therapy depending on whether or not she has bleeding issues.  Will watch in 2 heart. I spoke to her daughter, Mel Almond, and conveyed the results.  Result Date: 09/13/2022   Lat 3rd Mrg  lesion is 100% stenosed.  Balloon angioplasty was performed using a BALLN EMERGE MR 2.0X12.   Post intervention, there is a 0% residual stenosis.   Dist LAD lesion is 100% stenosed.  Balloon angioplasty was performed using a BALLN EMERGE MR 2.0X12.   Post intervention, there is a 100% residual stenosis.   Mid Cx lesion is 50% stenosed.  Thrombotic.   LV end diastolic pressure is moderately elevated.   Recommend to resume Apixaban, at currently prescribed dose and frequency.   Recommend concurrent antiplatelet therapy of Clopidogrel 75mg  daily for 12 months. No atherosclerotic coronary artery disease.  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  Angioplasty done in both areas but neither area receiving TIMI-3 flow.  Resume heparin 6 hours after femoral sheath removal.  She will need echocardiogram to look for source of embolism.  Would consider long-term oral anticoagulation given her presentation here today.  Could also use clopidogrel for antiplatelet therapy depending on whether or not she has bleeding issues.  Will watch in 2 heart. I spoke to her daughter, Mel Almond, and conveyed the results.   DG Chest Port 1 View  Result Date: 09/13/2022 CLINICAL DATA:  Chest pain EXAM: PORTABLE CHEST 1 VIEW COMPARISON:  04/30/2022 FINDINGS: Cardiomegaly with mild perihilar edema. No definite pleural effusions. No pneumothorax. IMPRESSION: Cardiomegaly with mild perihilar edema. Electronically Signed   By: Charline Bills M.D.   On: 09/13/2022 00:16   Disposition   Pt is being discharged home today in good condition.  Follow-up Plans & Appointments     Follow-up Information     Jodelle Red, MD Follow up.   Specialty:  Cardiology Why: Humberto Seals - cardiology office will call you to arrange follow-up. Contact information: [address] [place] [address] [phone]                Discharge Instructions     AMB Referral to Cardiac Rehabilitation - Phase II   Complete by: As directed    Diagnosis: Coronary Stents   After initial evaluation and assessments completed: Virtual Based Care may be provided alone or in conjunction with Phase 2 Cardiac Rehab based on patient barriers.: Yes   Intensive Cardiac Rehabilitation (ICR) [facility] location only OR Traditional Cardiac Rehabilitation (TCR) *If criteria for ICR are not met will enroll in TCR [facility] only): Yes   Diet - low sodium heart healthy   Complete by: As directed    Discharge wound care:   Complete by: As directed    Keep cath procedure site clean & dry. If you notice increased pain, swelling, bleeding or pus, call/return!   Increase activity slowly   Complete by: As directed    No driving for 2 weeks. No lifting over 10 lbs for 4 weeks. No sexual activity for 4 weeks. You may not return to work until cleared by your cardiologist (if you currently work). Keep procedure site clean & dry. If you notice increased pain, swelling, bleeding or pus, call/return!  You may shower, but no soaking baths/hot tubs/pools for 1 week.        Discharge Medications   Allergies as of 09/17/2022       Reactions   Morphine Shortness Of Breath, Anaphylaxis   Prednisone Other (See Comments)   Other reaction(s): Other (See Comments) Muscle spasms Patient says prednisone causes her to cramp all over, muscle spasms uncontrolled   Tuna [fish Allergy] Itching, Swelling, Rash,  Other (See Comments)   Face droops also   Amlodipine Other (See Comments)   Angioedema (09/05/17 ED visit)   Iodinated Contrast Media Itching   Tape Itching   Adhesive tape   paper tape ok        Medication List     STOP taking these medications    aspirin EC 81 MG  tablet   omeprazole 40 MG capsule Commonly known as: PRILOSEC       TAKE these medications    albuterol 108 (90 Base) MCG/ACT inhaler Commonly known as: VENTOLIN HFA Inhale 2 puffs into the lungs every 4 (four) hours as needed for wheezing or shortness of breath.   ALPRAZolam 1 MG tablet Commonly known as: XANAX Take 1 mg by mouth 3 (three) times daily.   atorvastatin 80 MG tablet Commonly known as: LIPITOR Take 1 tablet (80 mg total) by mouth daily.   cinacalcet 30 MG tablet Commonly known as: SENSIPAR Take 2 tablets (60 mg total) by mouth daily with supper.   clopidogrel 75 MG tablet Commonly known as: PLAVIX Take 1 tablet (75 mg total) by mouth daily with breakfast.   cyclobenzaprine 10 MG tablet Commonly known as: FLEXERIL Take 10 mg by mouth 2 (two) times daily as needed for muscle spasms.   Eliquis 5 MG Tabs tablet Generic drug: apixaban Take 1 tablet (5 mg total) by mouth 2 (two) times daily.   ferric citrate 1 GM 210 MG(Fe) tablet Commonly known as: AURYXIA Take 1 tablet (210 mg total) by mouth 3 (three) times daily with meals. What changed:  how much to take when to take this   fluticasone furoate-vilanterol 100-25 MCG/ACT Aepb Commonly known as: Breo Ellipta Inhale 1 puff into the lungs daily.   isosorbide mononitrate 30 MG 24 hr tablet Commonly known as: IMDUR Take 1 tablet (30 mg total) by mouth daily. Do not take if systolic blood pressure (top number) is less than 110.   lactulose 10 GM/15ML solution Commonly known as: CHRONULAC Take 30 mLs by mouth daily as needed for mild constipation, moderate constipation or severe constipation.   lurasidone 20 MG Tabs tablet Commonly known as: LATUDA Take 20 mg by mouth daily.   metoprolol tartrate 25 MG tablet Commonly known as: LOPRESSOR Take 0.5 tablets (12.5 mg total) by mouth 2 (two) times daily.   midodrine 10 MG tablet Commonly known as: PROAMATINE Take 10 mg by mouth 3 (three) times daily  as needed (Hypotension).   mupirocin ointment 2 % Commonly known as: BACTROBAN Apply 1 Application topically 2 (two) times daily.   Narcan 4 MG/0.1ML Liqd nasal spray kit Generic drug: naloxone Place 1 spray into the nose as needed (accidental overdose).   neomycin-polymyxin-hydrocortisone 3.5-10000-1 OTIC suspension Commonly known as: CORTISPORIN Place 2 drops into both ears in the morning and at bedtime.   ondansetron 8 MG disintegrating tablet Commonly known as: ZOFRAN-ODT Take 8 mg by mouth 2 (two) times daily.   Oxycodone HCl 10 MG Tabs Take 1 tablet (10 mg total) by mouth 3 (three) times daily as needed (severe pain). What changed: when to take this   pantoprazole 40 MG tablet Commonly known as: Protonix Take 1 tablet (40 mg total) by mouth daily.   sevelamer carbonate 800 MG tablet Commonly known as: RENVELA Take 1,600 mg by mouth 3 (three) times daily.               Durable Medical Equipment  (From admission, onward)  Start     Ordered   09/17/22 0955  For home use only DME oxygen  Once       Question Answer Comment  Length of Need 12 Months   Mode or (Route) Nasal cannula   Liters per Minute 3   Frequency Continuous (stationary and portable oxygen unit needed)   Oxygen conserving device Yes   Oxygen delivery system Gas      09/17/22 0954   09/17/22 0953  For home use only DME Walker rolling  Once       Question Answer Comment  Walker: With 5 Inch Wheels   Patient needs a walker to treat with the following condition Fx      09/17/22 0954              Discharge Care Instructions  (From admission, onward)           Start     Ordered   09/17/22 0000  Discharge wound care:       Comments: Keep cath procedure site clean & dry. If you notice increased pain, swelling, bleeding or pus, call/return!   09/17/22 0952               Outstanding Labs/Studies   N/A  Duration of Discharge Encounter   Greater than 30  minutes including physician time.  Signed, Laurann Montana, PA-C 09/17/2022, 10:12 AM  I have examined the patient and reviewed assessment and plan and discussed with patient.  Agree with above as stated.    CAD/acute anteroapical MI: Thromboembolic event to her coronaries, likely due to thrombus from previously undetected atrial fibrillation.  She had occlusions of the distal OM 3 and the apical LAD.  Angioplasty was done in both areas but it was difficult to restore significant flow.  Patient has been medically managed.  EF now in the 35 to 40% range.  Plan is for Eliquis for stroke prevention and Plavix for antiplatelet therapy.  No aspirin to reduce bleeding risk. Statin was started since she is post MI but the patient does not have atherosclerotic CAD.    Acute systolic heart failure/ischemic cardiomyopathy: LVEF reduced.  Hopefully, this will improve with time.  Her blood pressure does not allow for additional medical therapy for LV dysfunction.   Atrial fibrillation: Not a candidate for rhythm modification given recent thromboembolic event.  Rate control and anticoagulation.  Low dose metoprolol for rate control.    Hypertension: Stable.  BP has ben borderline low at times.    End-stage renal disease: Hemodialysis on Tuesday Thursday and Saturday.  Hopeful for discharge later today after dialysis.   Chronic respiratory failure: She is chronically on supplemental oxygen at home, 3 L [place].  Breathing stable this morning.    Plan for discharge today after dialysis. Full discharge summary to follow.  Lance Muss

## 2022-09-17 NOTE — Progress Notes (Signed)
   09/17/22 1227  Vitals  Temp 98.6 F (37 C)  Pulse Rate 83  Resp 16  BP 101/62  SpO2 95 %  Weight 72.3 kg  Type of Weight Post-Dialysis  Oxygen Therapy  O2 Flow Rate (L/min) 2 L/min  Patient Activity (if Appropriate) In bed  Pulse Oximetry Type Continuous  Oximetry Probe Site Changed No  Post Treatment  Dialyzer Clearance Lightly streaked  Duration of HD Treatment -hour(s) 3.5 hour(s)  Hemodialysis Intake (mL) 0 mL  Liters Processed 84  Fluid Removed (mL) 1400 mL  Tolerated HD Treatment Yes  AVG/AVF Arterial Site Held (minutes) 10 minutes  AVG/AVF Venous Site Held (minutes) 10 minutes   Received patient in bed to unit.  Alert and oriented.  Informed consent signed and in chart.   TX duration:3.5  Patient tolerated well.  Transported back to the room  Alert, without acute distress.  Hand-off given to patient's nurse.   Access used: LUAF Access issues: no complications  Total UF removed: 1400 Medication(s) given: hectoral iv x 1   [facility]

## 2022-09-17 NOTE — Procedures (Signed)
Patient was seen on dialysis and the procedure was supervised.  BFR 400  Via AVF BP is  97/68.   Patient appears to be tolerating treatment well.   Desiree [name] 09/17/2022

## 2022-09-17 NOTE — Progress Notes (Signed)
PT Cancellation Note  Patient Details Name: KHRISTINA NILGES MRN: [id number] DOB: 1975/04/24   Cancelled Treatment:    Reason Eval/Treat Not Completed: Patient at procedure or test/unavailable to HD- will attempt to return later if time/schedule allow and if pt is agreeable.   Nedra Hai, PT, DPT 09/17/22 8:28 AM

## 2022-09-17 NOTE — Telephone Encounter (Signed)
   Transition of Care Follow-up Phone Call Request    Patient Name: Desiree [name] Date of Birth: January 11, 1976 Date of Encounter: 09/17/2022  Primary Care Provider:  Jearld Lesch, MD Primary Cardiologist:  Jodelle Red, MD  [name] has been scheduled for a transition of care follow up appointment with a [facility] provider:  **PENDING** - sent staff message to [facility] scheduling pool to arrange  Please reach out to [name] within 48 hours to confirm appointment and review transition of care protocol questionnaire.  Laurann Montana, PA-C  09/17/2022, 9:03 AM

## 2022-09-18 ENCOUNTER — Telehealth: Payer: Self-pay | Admitting: Nephrology

## 2022-09-18 NOTE — Telephone Encounter (Signed)
As promised to patient, call placed to [employer] about patients smaller oxygen tanks.  Spoke with Melissa at [employer] who is going to contact patient and assess her current oxygen tank status. Jim Like MHA RN CCM

## 2022-09-18 NOTE — Progress Notes (Signed)
Late Note Entry  Pt was d/c to home yesterday (holiday). Contacted [facility] this morning to advise clinic of pt's d/c date and that pt should resume care tomorrow.   Olivia Canter Renal Navigator [phone]

## 2022-09-18 NOTE — Telephone Encounter (Signed)
Pt contacted for [facility] call post Discharge on 09/17/2022  Patient understands to follow up with Gillian Shields on September 28, 2022 at [facility] office.   Patient understands discharge instructions? Yes  Patient understands medications and regiment? Medications  and instructions reviewed with patient.  Patient understands to bring all medications to this visit? Yes  Ask patient:  Are you enrolled in My Chart (yes or no)  If no ask patient if they would like to enroll. No. Enrollment intiated via text to patient per her request.    For patients Patient was struggling with medications and instructions.  Reviewed medications one by one. Pt stated she felt as if she was smothering.  Pt denies shortness of breath is able to complete full sentences, denies chest pain. States she was experiencing same feeling in the hospital and was told it was a "panic attack," Pt has a history of frequent panic attacks. Discussed with Dr. Bjorn Pippin. No intervention required.  Pt instructed to contact the office if symptoms change or worsen. Jim Like MHA RN CCM

## 2022-09-18 NOTE — Telephone Encounter (Signed)
Transition of Care - Initial Contact from Inpatient Facility  Date of discharge: 09/17/22 Date of contact: 09/18/22  Method: Phone Spoke to: Patient  Patient contacted to discuss transition of care from recent inpatient hospitalization. Patient was admitted to [facility] from 6/29-09/17/22 with discharge diagnosis of acute anteroapical MI/Acute HF/CM.  The discharge medication list was reviewed. Patient understands the changes and has no concerns.   Patient will return to his/her outpatient HD unit on: Saturday 7/6.   No other concerns at this time.

## 2022-09-19 LAB — LIPOPROTEIN A (LPA): Lipoprotein (a): 87.6 nmol/L — ABNORMAL HIGH (ref <75.0)

## 2022-09-21 ENCOUNTER — Telehealth: Payer: Self-pay | Admitting: Interventional Cardiology

## 2022-09-21 NOTE — Telephone Encounter (Signed)
*  STAT* If patient is at the pharmacy, call can be transferred to refill team.   1. Which medications need to be refilled? (please list name of each medication and dose if known)  need a prior authorization for Cinacalcet  2. Which pharmacy/location (including street and city if local pharmacy) is medication to be sent to?[facility]  3. Do they need a 30 day or 90 day supply? 60 days

## 2022-09-22 ENCOUNTER — Other Ambulatory Visit (HOSPITAL_COMMUNITY): Payer: Self-pay

## 2022-09-22 NOTE — Telephone Encounter (Signed)
Pharmacy does not have access to bill this medication on Part B benefits (medical). This will have to come from a dialysis facility. Plan/Benefit Exclusion on Rx Benefits.

## 2022-09-23 NOTE — Telephone Encounter (Signed)
[facility] called back for an update on PA request. Please advise.

## 2022-09-23 NOTE — Telephone Encounter (Signed)
Per discharge summary patient is on hemodialysis on Tuesday Thursday and Saturday.  Followed by Dr Arrie Aran. Note below indicates prior auth needs to come from dialysis facility.  I spoke with [facility] and gave them this message.  [facility] will contact Dr [employer] office regarding prior auth .

## 2022-09-28 ENCOUNTER — Ambulatory Visit (HOSPITAL_BASED_OUTPATIENT_CLINIC_OR_DEPARTMENT_OTHER): Payer: 59 | Admitting: Family

## 2022-09-28 ENCOUNTER — Encounter (HOSPITAL_BASED_OUTPATIENT_CLINIC_OR_DEPARTMENT_OTHER): Payer: Self-pay | Admitting: Family

## 2022-09-28 ENCOUNTER — Telehealth (HOSPITAL_COMMUNITY): Payer: Self-pay | Admitting: *Deleted

## 2022-09-28 ENCOUNTER — Telehealth: Payer: Self-pay | Admitting: Licensed Clinical Social Worker

## 2022-09-28 VITALS — BP 116/66 | HR 65 | Ht 64.0 in | Wt 168.4 lb

## 2022-09-28 DIAGNOSIS — I5022 Chronic systolic (congestive) heart failure: Secondary | ICD-10-CM | POA: Diagnosis not present

## 2022-09-28 DIAGNOSIS — K219 Gastro-esophageal reflux disease without esophagitis: Secondary | ICD-10-CM

## 2022-09-28 DIAGNOSIS — I252 Old myocardial infarction: Secondary | ICD-10-CM

## 2022-09-28 DIAGNOSIS — I25118 Atherosclerotic heart disease of native coronary artery with other forms of angina pectoris: Secondary | ICD-10-CM | POA: Diagnosis not present

## 2022-09-28 DIAGNOSIS — E785 Hyperlipidemia, unspecified: Secondary | ICD-10-CM

## 2022-09-28 DIAGNOSIS — I480 Paroxysmal atrial fibrillation: Secondary | ICD-10-CM

## 2022-09-28 DIAGNOSIS — D6859 Other primary thrombophilia: Secondary | ICD-10-CM

## 2022-09-28 DIAGNOSIS — F419 Anxiety disorder, unspecified: Secondary | ICD-10-CM

## 2022-09-28 DIAGNOSIS — F32A Depression, unspecified: Secondary | ICD-10-CM

## 2022-09-28 MED ORDER — APIXABAN 5 MG PO TABS
5.0000 mg | ORAL_TABLET | Freq: Two times a day (BID) | ORAL | 1 refills | Status: AC
Start: 2022-09-28 — End: ?

## 2022-09-28 MED ORDER — ISOSORBIDE MONONITRATE ER 30 MG PO TB24
ORAL_TABLET | ORAL | 1 refills | Status: DC
Start: 2022-09-28 — End: 2022-11-05

## 2022-09-28 MED ORDER — METOPROLOL TARTRATE 25 MG PO TABS
12.5000 mg | ORAL_TABLET | Freq: Two times a day (BID) | ORAL | 1 refills | Status: DC
Start: 2022-09-28 — End: 2022-11-05

## 2022-09-28 MED ORDER — ATORVASTATIN CALCIUM 80 MG PO TABS
80.0000 mg | ORAL_TABLET | Freq: Every day | ORAL | 1 refills | Status: DC
Start: 2022-09-28 — End: 2023-09-20

## 2022-09-28 MED ORDER — PANTOPRAZOLE SODIUM 40 MG PO TBEC
40.0000 mg | DELAYED_RELEASE_TABLET | Freq: Every day | ORAL | 1 refills | Status: DC
Start: 2022-09-28 — End: 2023-10-21

## 2022-09-28 NOTE — Patient Instructions (Addendum)
Medication Instructions:   CHANGE Isosorbide to take only a HALF TABLET on Mondays, Wednesdays, Fridays, Sundays Do not take on dialysis days  You may take Midodrine as needed for low blood pressure.   CONTINUE Metoprolol to half tablet twice per day  *If you need a refill on your cardiac medications before your next appointment, please call your pharmacy*  Testing/Procedures: Your EKG today shows normal sinus rhythm which is a good result! You are no longer in atrial fibrillation.   Follow-Up: At [facility], you and your health needs are our priority.  As part of our continuing mission to provide you with exceptional heart care, we have created designated Provider Care Teams.  These Care Teams include your primary Cardiologist (physician) and Advanced Practice Providers (APPs -  Physician Assistants and Nurse Practitioners) who all work together to provide you with the care you need, when you need it.  We recommend signing up for the patient portal called "MyChart".  Sign up information is provided on this After Visit Summary.  MyChart is used to connect with patients for Virtual Visits (Telemedicine).  Patients are able to view lab/test results, encounter notes, upcoming appointments, etc.  Non-urgent messages can be sent to your provider as well.   To learn more about what you can do with MyChart, go to [email or web address].    Your next appointment:   2 month(s)  Provider:   Jodelle Red, MD    Other Instructions  Recommend establishing with psychiatry to help better manage your anxiety and depression. It is very common to have more difficulty with mental health after a heart attack. It is important to take care of both your physical and emotional heart.   We have referred you to cardiac rehab today. This is an exercise and teaching program for people who have had heart attacks or heart failure. If you have not heard from them in a week, recommend calling them  at [phone].   Heart Healthy Diet Recommendations: A low-salt diet is recommended. Meats should be grilled, baked, or boiled. Avoid fried foods. Focus on lean protein sources like fish or chicken with vegetables and fruits. The American Heart Association is a [employer]!  American Heart Association Diet and Lifeystyle Recommendations   Exercise recommendations: The American Heart Association recommends 150 minutes of moderate intensity exercise weekly. Try 30 minutes of moderate intensity exercise 4-5 times per week. This could include walking, jogging, or swimming. Be sure to gradually increase your exercise level   Heart-Healthy Eating Plan Eating a healthy diet is important for the health of your heart. A heart-healthy eating plan includes: Eating less unhealthy fats. Eating more healthy fats. Eating less salt in your food. Salt is also called sodium. Making other changes in your diet. Talk with your doctor or a diet specialist (dietitian) to create an eating plan that is right for you.What are tips for following this plan? Cooking Avoid frying your food. Try to bake, boil, grill, or broil it instead. You can also reduce fat by: Removing the skin from poultry. Removing all visible fats from meats. Steaming vegetables in water or broth. Meal planning  At meals, divide your plate into four equal parts: Fill one-half of your plate with vegetables and green salads. Fill one-fourth of your plate with whole grains. Fill one-fourth of your plate with lean protein foods. Eat 2-4 cups of vegetables per day. One cup of vegetables is: 1 cup (91 g) broccoli or cauliflower florets. 2 medium carrots. 1 large  bell pepper. 1 large sweet potato. 1 large tomato. 1 medium white potato. 2 cups (150 g) raw leafy greens. Eat 1-2 cups of fruit per day. One cup of fruit is: 1 small apple 1 large banana 1 cup (237 g) mixed fruit, 1 large orange,  cup (82 g) dried fruit, 1 cup (240  mL) 100% fruit juice. Eat more foods that have soluble fiber. These are apples, broccoli, carrots, beans, peas, and barley. Try to get 20-30 g of fiber per day. Eat 4-5 servings of nuts, legumes, and seeds per week: 1 serving of dried beans or legumes equals  cup (90 g) cooked. 1 serving of nuts is  oz (12 almonds, 24 pistachios, or 7 walnut halves). 1 serving of seeds equals  oz (8 g). General information Eat more home-cooked food. Eat less restaurant, buffet, and fast food. Limit or avoid alcohol. Limit foods that are high in starch and sugar. Avoid fried foods. Lose weight if you are overweight. Keep track of how much salt (sodium) you eat. This is important if you have high blood pressure. Ask your doctor to tell you more about this. Try to add vegetarian meals each week. Fats Choose healthy fats. These include olive oil and canola oil, flaxseeds, walnuts, almonds, and seeds. Eat more omega-3 fats. These include salmon, mackerel, sardines, tuna, flaxseed oil, and ground flaxseeds. Try to eat fish at least 2 times each week. Check food labels. Avoid foods with trans fats or high amounts of saturated fat. Limit saturated fats. These are often found in animal products, such as meats, butter, and cream. These are also found in plant foods, such as palm oil, palm kernel oil, and coconut oil. Avoid foods with partially hydrogenated oils in them. These have trans fats. Examples are stick margarine, some tub margarines, cookies, crackers, and other baked goods. What foods should I eat? Fruits All fresh, canned (in natural juice), or frozen fruits. Vegetables Fresh or frozen vegetables (raw, steamed, roasted, or grilled). Green salads. Grains Most grains. Choose whole wheat and whole grains most of the time. Rice and pasta, including brown rice and pastas made with whole wheat. Meats and other proteins Lean, well-trimmed beef, veal, pork, and lamb. Chicken and [place] without skin. All  fish and shellfish. Wild duck, rabbit, pheasant, and venison. Egg whites or low-cholesterol egg substitutes. Dried beans, peas, lentils, and tofu. Seeds and most nuts. Dairy Low-fat or nonfat cheeses, including ricotta and mozzarella. Skim or 1% milk that is liquid, powdered, or evaporated. Buttermilk that is made with low-fat milk. Nonfat or low-fat yogurt. Fats and oils Non-hydrogenated (trans-free) margarines. Vegetable oils, including soybean, sesame, sunflower, olive, peanut, safflower, corn, canola, and cottonseed. Salad dressings or mayonnaise made with a vegetable oil. Beverages Mineral water. Coffee and tea. Diet carbonated beverages. Sweets and desserts Sherbet, gelatin, and fruit ice. Small amounts of dark chocolate. Limit all sweets and desserts. Seasonings and condiments All seasonings and condiments. The items listed above may not be a complete list of foods and drinks you can eat. Contact a dietitian for more options. What foods should I avoid? Fruits Canned fruit in heavy syrup. Fruit in cream or butter sauce. Fried fruit. Limit coconut. Vegetables Vegetables cooked in cheese, cream, or butter sauce. Fried vegetables. Grains Breads that are made with saturated or trans fats, oils, or whole milk. Croissants. Sweet rolls. Donuts. High-fat crackers, such as cheese crackers. Meats and other proteins Fatty meats, such as hot dogs, ribs, sausage, bacon, rib-eye roast or steak. High-fat deli  meats, such as salami and bologna. Caviar. Domestic duck and goose. Organ meats, such as liver. Dairy Cream, sour cream, cream cheese, and creamed cottage cheese. Whole-milk cheeses. Whole or 2% milk that is liquid, evaporated, or condensed. Whole buttermilk. Cream sauce or high-fat cheese sauce. Yogurt that is made from whole milk. Fats and oils Meat fat, or shortening. Cocoa butter, hydrogenated oils, palm oil, coconut oil, palm kernel oil. Solid fats and shortenings, including bacon fat, salt  pork, lard, and butter. Nondairy cream substitutes. Salad dressings with cheese or sour cream. Beverages Regular sodas and juice drinks with added sugar. Sweets and desserts Frosting. Pudding. Cookies. Cakes. Pies. Milk chocolate or white chocolate. Buttered syrups. Full-fat ice cream or ice cream drinks. The items listed above may not be a complete list of foods and drinks to avoid. Contact a dietitian for more information. Summary Heart-healthy meal planning includes eating less unhealthy fats, eating more healthy fats, and making other changes in your diet. Eat a balanced diet. This includes fruits and vegetables, low-fat or nonfat dairy, lean protein, nuts and legumes, whole grains, and heart-healthy oils and fats. This information is not intended to replace advice given to you by your health care provider. Make sure you discuss any questions you have with your health care provider. Document Revised: 04/07/2021 Document Reviewed: 04/07/2021 Elsevier Patient Education  2024 [employer].

## 2022-09-28 NOTE — Addendum Note (Signed)
Addended by: Alver Sorrow on: 09/28/2022 01:28 PM   Modules accepted: Orders

## 2022-09-28 NOTE — Progress Notes (Signed)
Cardiology Office Note:  .   Date:  09/28/2022  ID:  Desiree [name], DOB March 31, 1975, MRN [id number] PCP: Jearld Lesch, MD  [facility] Providers Cardiologist:  Jodelle Red, MD    History of Present Illness: .   Desiree [name] is a 47 y.o. female ESRD on HD T/TH/S, hypertension, seizures, migraine, gout, anemia, anxiety, childhood asthma, GERD, chronic respiratory failure on 3 L home O2, CAD s/p anterior STEMI 09/12/2022 with angioplasty.  She presented 09/12/22 due to severe crushing chest pain associate with nausea vomiting.  Found to have anterior ST elevations by EMS.  Underwent LHC 09/13/2022 with finding suspected be thromboembolic event to coronaries, likely from previously undetected atrial fibrillation.  Due to occlusion of left distal OM 3 and apical LAD.  Angioplasty done to both areas but difficult to restore significant flow.  Echocardiogram LVEF 35-40%.  It also revealed calcified mobile MAC with no clinical signs of endocarditis.  She was discharged on Eliquis, Plavix.  BP limited titration of heart failure medications.  She presents today for follow-up with a myriad of concerns. She reports panic attacks, depressed, crying all the time, not wanting to go anywhere. She used to take 2mg  of xanax and per her report tells me in order to get her 2mg  dose back "a doctor has to tell my doctor that I need it". This is presently managed by her PCP. She self discontinued her Latuda after hospital discharge as reports it was causing headaches. Her next visit with PCP is 10/06/22. Encouraged to establish with psychiatry, will refer.   She notes some chest pain but is not certain if this is chest pain or panic attacks. It self resolves. Her blood pressure at home has been 110s/60s. She notes some hypotension particularly at HD and wonders if Imdur needs reduced. She has not been taking Midodrine, reassured she could take as needed.   Wearing O2 regularly though reports her tank  is too heavy as is the tote. Encouraged to discuss with pulmonology. Not sure there is an alternative.   Reports orthopedic discomfort including back down to her legs. Rubbing her legs with tiger balm twice per day after bathing. She is still trying to ambulate at home.   She notes intermittent bilatearl edema. Does not notice correlation with non-dialysis days.   She does not drive and uses transportation to get to appointments.   ROS: Please see the history of present illness.    All other systems reviewed and are negative.   Studies Reviewed: Marland Kitchen   EKG Interpretation Date/Time:  Monday September 28 2022 09:11:48 EDT Ventricular Rate:  65 PR Interval:  160 QRS Duration:  78 QT Interval:  468 QTC Calculation: 486 R Axis:   -22  Text Interpretation: Normal sinus rhythm TWI inferior leads. TWI lateral leads. Confirmed by Gillian Shields ([id number]) on 09/28/2022 9:22:28 AM    Cardiac Studies & Procedures   CARDIAC CATHETERIZATION  CARDIAC CATHETERIZATION 09/13/2022  Narrative   Lat 3rd Mrg lesion is 100% stenosed.  Balloon angioplasty was performed using a BALLN EMERGE MR 2.0X12.   Post intervention, there is a 0% residual stenosis.   Dist LAD lesion is 100% stenosed.  Balloon angioplasty was performed using a BALLN EMERGE MR 2.0X12.   Post intervention, there is a 100% residual stenosis.   Mid Cx lesion is 50% stenosed.  Thrombotic.   LV end diastolic pressure is moderately elevated.   Recommend to resume Apixaban, at currently prescribed dose and frequency.  Recommend concurrent antiplatelet therapy of Clopidogrel 75mg  daily for 12 months.  No atherosclerotic coronary artery disease.  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  Angioplasty done in both areas but neither area receiving TIMI-3 flow.  Resume heparin 6 hours after femoral sheath removal.  She will need echocardiogram to look for source of embolism.   Would consider long-term oral anticoagulation given her presentation here today.  Could also use clopidogrel for antiplatelet therapy depending on whether or not she has bleeding issues.  Will watch in 2 heart.  I spoke to her daughter, Mel Almond, and conveyed the results.  Findings Coronary Findings Diagnostic  Dominance: Right  Left Anterior Descending Dist LAD lesion is 100% stenosed.  Left Circumflex Mid Cx lesion is 50% stenosed. The lesion is thrombotic.  Lateral Third Obtuse Marginal Branch Lat 3rd Mrg lesion is 100% stenosed.  Intervention  Dist LAD lesion Angioplasty CATH LAUNCHER 6FR EBU3.5 guide catheter was inserted. WIRE RUNTHROUGH .[id number] guidewire used to cross lesion. Balloon angioplasty was performed using a BALLN EMERGE MR 2.0X12. After the angioplasty, flow reached further in the apical LAD but there was still a cut off before the end of the vessel. Post-Intervention Lesion Assessment The intervention was unsuccessful. Pre-interventional TIMI flow is 0. Post-intervention TIMI flow is 0. There is a 100% residual stenosis post intervention.  Lat 3rd Mrg lesion Angioplasty CATH LAUNCHER 6FR EBU3.5 guide catheter was inserted. WIRE RUNTHROUGH .[id number] guidewire used to cross lesion. Balloon angioplasty was performed using a BALLN EMERGE MR 2.0X12. Post-Intervention Lesion Assessment The intervention was successful. Pre-interventional TIMI flow is 0. Post-intervention TIMI flow is 2. No complications occurred at this lesion. There is a 0% residual stenosis post intervention.     ECHOCARDIOGRAM  ECHOCARDIOGRAM COMPLETE BUBBLE STUDY 09/13/2022  Narrative ECHOCARDIOGRAM REPORT    Patient Name:   Desiree [name] Date of Exam: 09/13/2022 Medical Rec #:  [id number]       Height:       64.0 in Accession #:    [id number]      Weight:       168.2 lb Date of Birth:  1975/06/19        BSA:          1.818 m Patient Age:    47 years        BP:           126/85  mmHg Patient Gender: F               HR:           86 bpm. Exam Location:  Inpatient  Procedure: 2D Echo, Color Doppler, Cardiac Doppler and Saline Contrast Bubble Study  Indications:    acute myocardial infarction  History:        Patient has prior history of Echocardiogram examinations, most recent 01/05/2022. CHF, End stage renal disease, Arrythmias:Atrial Fibrillation, Signs/Symptoms:Chest Pain; Risk Factors:Hypertension and cocaine use.  Sonographer:    Delcie Roch RDCS Referring Phys: 91 JAYADEEP S VARANASI  IMPRESSIONS   1. Left ventricular ejection fraction, by estimation, is 35 to 40%. The left ventricle has moderately decreased function. The left ventricle demonstrates regional wall motion abnormalities (see scoring diagram/findings for description). Left ventricular diastolic parameters are indeterminate. 2. Right ventricular systolic function is normal. The right ventricular size is normal. There is severely elevated pulmonary artery systolic pressure. 3. A small pericardial effusion is present. The pericardial effusion  is circumferential. 4. The mitral valve is abnormal. Mild mitral valve regurgitation. No evidence of mitral stenosis. Severe mitral annular calcification (MAC). There is a mobile echodensity measuring 1.7 x 0.45 cm that is attached to the atrial surface of the posterior mitral valve annulus. Seen in prior echo images. Differential diagnosis include mobile MAC, calcified thrombus, calcified vegetation. 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 Clinical correlation is recommended. Consider TEE for further evaluation. 5. The tricuspid valve is abnormal. Tricuspid valve regurgitation is moderate to severe. 6. The aortic valve was not well visualized. Aortic valve regurgitation is not visualized. No aortic stenosis is present. 7. The inferior vena cava is dilated in size with <50% respiratory variability, suggesting right atrial pressure of 15 mmHg. 8. Agitated  saline contrast bubble study was negative, with no evidence of any interatrial shunt.  Comparison(s): Changes from prior study are noted. LVEF worsened to 35-40% with RWMA.  FINDINGS Left Ventricle: Left ventricular ejection fraction, by estimation, is 35 to 40%. The left ventricle has moderately decreased function. The left ventricle demonstrates regional wall motion abnormalities. The left ventricular internal cavity size was normal in size. There is no left ventricular hypertrophy. Left ventricular diastolic parameters are indeterminate.   LV Wall Scoring: The apical lateral segment, apical inferior segment, and apex are akinetic. The entire anterior wall, mid and distal anterior septum, inferior septum, and posterior wall are hypokinetic. The antero-lateral wall, inferior wall, and basal anteroseptal segment are normal.  Right Ventricle: The right ventricular size is normal. No increase in right ventricular wall thickness. Right ventricular systolic function is normal. There is severely elevated pulmonary artery systolic pressure. The tricuspid regurgitant velocity is 3.38 m/s, and with an assumed right atrial pressure of 15 mmHg, the estimated right ventricular systolic pressure is 60.7 mmHg.  Left Atrium: Left atrial size was normal in size.  Right Atrium: Right atrial size was normal in size.  Pericardium: A small pericardial effusion is present. The pericardial effusion is circumferential.  Mitral Valve: The mitral valve is abnormal. Severe mitral annular calcification. Mild mitral valve regurgitation. No evidence of mitral valve stenosis. MV peak gradient, 14.1 mmHg. The mean mitral valve gradient is 4.3 mmHg.  Tricuspid Valve: The tricuspid valve is abnormal. Tricuspid valve regurgitation is moderate to severe. No evidence of tricuspid stenosis.  Aortic Valve: The aortic valve was not well visualized. There is moderate aortic valve annular calcification. Aortic valve  regurgitation is not visualized. No aortic stenosis is present.  Pulmonic Valve: The pulmonic valve was normal in structure. Pulmonic valve regurgitation is mild. No evidence of pulmonic stenosis.  Aorta: The aortic root and ascending aorta are structurally normal, with no evidence of dilitation.  Venous: The inferior vena cava is dilated in size with less than 50% respiratory variability, suggesting right atrial pressure of 15 mmHg.  IAS/Shunts: No atrial level shunt detected by color flow Doppler. Agitated saline contrast was given intravenously to evaluate for intracardiac shunting. Agitated saline contrast bubble study was negative, with no evidence of any interatrial shunt.   LEFT VENTRICLE PLAX 2D LVIDd:         4.70 cm LVIDs:         3.00 cm LV PW:         0.90 cm LV IVS:        0.80 cm LVOT diam:     1.60 cm LV SV:         38 LV SV Index:   21 LVOT Area:  2.01 cm   RIGHT VENTRICLE             IVC RV Basal diam:  2.70 cm     IVC diam: 2.30 cm RV S prime:     11.70 cm/s TAPSE (M-mode): 1.3 cm  LEFT ATRIUM             Index        RIGHT ATRIUM           Index LA diam:        4.10 cm 2.26 cm/m   RA Area:     13.50 cm LA Vol (A2C):   70.9 ml 39.01 ml/m  RA Volume:   29.10 ml  16.01 ml/m LA Vol (A4C):   51.0 ml 28.06 ml/m LA Biplane Vol: 61.3 ml 33.73 ml/m AORTIC VALVE LVOT Vmax:   110.00 cm/s LVOT Vmean:  71.933 cm/s LVOT VTI:    0.190 m  AORTA Ao Root diam: 2.70 cm Ao Asc diam:  3.00 cm  MITRAL VALVE             TRICUSPID VALVE MV Area VTI:  1.06 cm   TR Peak grad:   45.7 mmHg MV Peak grad: 14.1 mmHg  TR Vmax:        338.00 cm/s MV Mean grad: 4.3 mmHg MV Vmax:      1.88 m/s   SHUNTS MV Vmean:     89.1 cm/s  Systemic VTI:  0.19 m Systemic Diam: 1.60 cm  Vishnu Priya Mallipeddi Electronically signed by Winfield Rast Mallipeddi Signature Date/Time: 09/13/2022/7:35:41 PM    Final             Risk Assessment/Calculations:               CHA2DS2-VASc Score = 4   This indicates a 4.8% annual risk of stroke. The patient's score is based upon: CHF History: 1 HTN History: 1 Diabetes History: 0 Stroke History: 0 Vascular Disease History: 1 Age Score: 0 Gender Score: 1     Physical Exam:   VS:  BP 116/66 (BP Location: Right Arm, Patient Position: Sitting, Cuff Size: Normal)   Pulse 65   Ht 5\' 4"  (1.626 m)   Wt 168 lb 6.4 oz (76.4 kg)   BMI 28.91 kg/m    Wt Readings from Last 3 Encounters:  09/28/22 168 lb 6.4 oz (76.4 kg)  09/17/22 159 lb 6.3 oz (72.3 kg)  04/30/22 185 lb (83.9 kg)    GEN: Well nourished, well developed in no acute distress NECK: No JVD; No carotid bruits CARDIAC: RRR, no murmurs, rubs, gallops RESPIRATORY:  Clear to auscultation without rales, wheezing or rhonchi  ABDOMEN: Soft, non-tender, non-distended EXTREMITIES:  No edema; No deformity  PSYCH: tearful  ASSESSMENT AND PLAN: .    Anxiety - Self discontinued her Latuda due to headaches. Notes increasing anxiety, panic attacks since her MI. She is taking Xanax 1 mg PRN 3x per day but wishes for 2mg  dose. Discussed this would have to come from PCP or psychiatry. Will refer to psychiatry as she would benefit from improved control of anxiety and suspect longer acting agent would be safer.   CAD / HLD LDL goal <70 - 09/12/22 STEMI suspected thromboembolic due to thrombus from previously undetected atrial fibrillation  with angioplasty to OM3 and apical LAD. GDMT Plavix, metoprolol, Imdur, atorvastatin. Recommend aiming for 150 minutes of moderate intensity activity per week and following a heart healthy diet.  Referred to cardiac rehab today. Plan  for lipid panel at follow up. Can readdress lipid goals based on repeat labs as CAD was thromboembolic rather than atherosclerotic.   ESRD on HD T/TH/S- Follows with nephrology.   Systolic heart failure/ICM/severe pulmonary hypertension/mild MR/moderate to severe TR - Volume management per HD. GDMT Imdur  (reduce to half tablet only to be taken on non-HD days due to hypotension), Metoprolol. BP will not permit Hydralazine.   Atrial fibrillation/hypercoagulable state- EKG today shows NSR. Reports only rare palpitations. Continue Lopressor 12.5mg  BID, Eliquis 5mg  BID. Refills provided. CHA2DS2-VASc Score = 4 [CHF History: 1, HTN History: 1, Diabetes History: 0, Stroke History: 0, Vascular Disease History: 1, Age Score: 0, Gender Score: 1].  Therefore, the patient's annual risk of stroke is 4.8 %.      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  HTN-  Reports episodes of hypotensive. Reiterated that she can take her Midodrine as needed for hypotension. Will reduce her Imdur to half tablet to be taken on non-dialysis days only. Discussed to monitor BP at home at least 2 hours after medications and sitting for 5-10 minutes.   Chronic respiratory failure-on home O2.  Follows with pulmonology.  GERD - Continue Protonix. Refill provided.     Cardiac Rehabilitation Eligibility Assessment  The patient is ready to start cardiac rehabilitation from a cardiac standpoint.       Dispo: follow up in 2 months  Signed, Alver Sorrow, NP

## 2022-09-28 NOTE — Telephone Encounter (Signed)
Alver Sorrow, NP  Chelsea Aus, RN Not any usual concern. Let me know if <100/60 or symptomatic. You can always secure chat me (I'm usually off on Wednesdays) or just send a staff message like usual. She is having most BP drops during HD (which is somewhat expected) and we did reduce her Imdur so should not drop as much.  Thanks! Caitlin       Previous Messages    ----- Message ----- From: Chelsea Aus, RN Sent: 09/28/2022   1:46 PM EDT To: Alver Sorrow, NP  Certainly! She has dialysis on T,Th and Sat.  Exercise would be on MWF. Any concerns for BP during exercise on MWF? If so, any restrictions or bp parameters to observe? Thanks for your feedback. Clennon Nasca ----- Message ----- From: Alver Sorrow, NP Sent: 09/28/2022   1:33 PM EDT To: Chelsea Aus, RN  She is ambulating around her home and walked in her neighborhood one day next week. We talked about the exercise program and she felt she could participate. She uses medical transport for her appointments so anticipate she would use transport for cardiac rehab. So I think it is worth trying - if there are any issues certainly let me know! ----- Message ----- From: Chelsea Aus, RN Sent: 09/28/2022   1:25 PM EDT To: Alver Sorrow, NP  Hi Caitlin,  Wanted to touch base with you about this pt.  Seen by phase I inpatient, felt not to be appropriate due to physical state.  She was unable to transfer from bed to chair.  Also concerns regarding transportation several times a week along with dialysis schedule.  Were you able to see how mobile she is and whether she could participate in group exercise.  your thoughts?

## 2022-09-28 NOTE — Telephone Encounter (Signed)
H&V Care Navigation CSW Progress Note  Clinical Social Worker  was contacted  to assist with connecting pt with psychiatry resources as pt interested in assistance with Xanax prescription. I was able to utilize the [facility] Dual Complete Medicare/Medicaid portal to find in network options. I have sent those to Gillian Shields to provide to pt, our team remains available as needed.   Patient is participating in a Managed Medicaid Plan:  No, U[employer] Dual Complete  SDOH Screenings   Food Insecurity: No Food Insecurity (09/13/2022)  Housing: Low Risk  (09/13/2022)  Transportation Needs: No Transportation Needs (09/13/2022)  Utilities: Not At Risk (09/13/2022)  Tobacco Use: Medium Risk (09/28/2022)   Octavio Graves, MSW, LCSW Clinical Social Worker II [facility] Heart/Vascular Care Navigation  [phone]- work cell phone (preferred) [phone]- desk phone

## 2022-09-30 ENCOUNTER — Telehealth (HOSPITAL_COMMUNITY): Payer: Self-pay | Admitting: *Deleted

## 2022-09-30 NOTE — Telephone Encounter (Signed)
Alver Sorrow, NP  Chelsea Aus, RN Hi [place],  Okay to use oxygen. She uses 3L and has not had to increase recently per her report. I would aim for O2 saturation >90% and have her rest if not greater than that.  Best, Alver Sorrow, NP       Previous Messages    ----- Message ----- From: Chelsea Aus, RN Sent: 09/29/2022   2:10 PM EDT To: Alver Sorrow, NP  Luther Parody  I see that this pt wears O2 at 3Lnc.  For our cardiac rehab patients we do need the ok to use oxygen and  parameters at rest and on exertion that is appropriate for this pt.  Looks like Dr. Vassie Loll ordered full PFT in February that were not completed.  What is acceptable?  Thanks [employer]

## 2022-10-01 ENCOUNTER — Emergency Department (HOSPITAL_BASED_OUTPATIENT_CLINIC_OR_DEPARTMENT_OTHER): Payer: 59 | Admitting: Radiology

## 2022-10-01 ENCOUNTER — Encounter (HOSPITAL_BASED_OUTPATIENT_CLINIC_OR_DEPARTMENT_OTHER): Payer: Self-pay

## 2022-10-01 ENCOUNTER — Emergency Department (HOSPITAL_BASED_OUTPATIENT_CLINIC_OR_DEPARTMENT_OTHER): Payer: 59

## 2022-10-01 ENCOUNTER — Other Ambulatory Visit: Payer: Self-pay

## 2022-10-01 ENCOUNTER — Observation Stay (HOSPITAL_BASED_OUTPATIENT_CLINIC_OR_DEPARTMENT_OTHER)
Admission: EM | Admit: 2022-10-01 | Discharge: 2022-10-03 | Disposition: A | Discharge status: Home / Self Care | Payer: 59 | Attending: Internal Medicine | Admitting: Internal Medicine

## 2022-10-01 ENCOUNTER — Inpatient Hospital Stay (HOSPITAL_BASED_OUTPATIENT_CLINIC_OR_DEPARTMENT_OTHER)
Admission: RE | Admit: 2022-10-01 | Discharge: 2022-10-01 | Disposition: A | Discharge status: Home / Self Care | Payer: 59 | Source: Ambulatory Visit

## 2022-10-01 DIAGNOSIS — F32A Depression, unspecified: Secondary | ICD-10-CM | POA: Diagnosis present

## 2022-10-01 DIAGNOSIS — I132 Hypertensive heart and chronic kidney disease with heart failure and with stage 5 chronic kidney disease, or end stage renal disease: Secondary | ICD-10-CM | POA: Diagnosis not present

## 2022-10-01 DIAGNOSIS — Z951 Presence of aortocoronary bypass graft: Secondary | ICD-10-CM | POA: Diagnosis not present

## 2022-10-01 DIAGNOSIS — R072 Precordial pain: Principal | ICD-10-CM | POA: Insufficient documentation

## 2022-10-01 DIAGNOSIS — G8929 Other chronic pain: Secondary | ICD-10-CM | POA: Insufficient documentation

## 2022-10-01 DIAGNOSIS — N186 End stage renal disease: Secondary | ICD-10-CM | POA: Diagnosis not present

## 2022-10-01 DIAGNOSIS — D631 Anemia in chronic kidney disease: Secondary | ICD-10-CM | POA: Diagnosis not present

## 2022-10-01 DIAGNOSIS — I5023 Acute on chronic systolic (congestive) heart failure: Secondary | ICD-10-CM | POA: Diagnosis not present

## 2022-10-01 DIAGNOSIS — F419 Anxiety disorder, unspecified: Secondary | ICD-10-CM | POA: Diagnosis present

## 2022-10-01 DIAGNOSIS — I4891 Unspecified atrial fibrillation: Secondary | ICD-10-CM

## 2022-10-01 DIAGNOSIS — Z79899 Other long term (current) drug therapy: Secondary | ICD-10-CM | POA: Insufficient documentation

## 2022-10-01 DIAGNOSIS — I340 Nonrheumatic mitral (valve) insufficiency: Secondary | ICD-10-CM | POA: Diagnosis not present

## 2022-10-01 DIAGNOSIS — E785 Hyperlipidemia, unspecified: Secondary | ICD-10-CM | POA: Insufficient documentation

## 2022-10-01 DIAGNOSIS — R079 Chest pain, unspecified: Secondary | ICD-10-CM | POA: Diagnosis not present

## 2022-10-01 DIAGNOSIS — I3139 Other pericardial effusion (noninflammatory): Secondary | ICD-10-CM | POA: Diagnosis not present

## 2022-10-01 DIAGNOSIS — Z7902 Long term (current) use of antithrombotics/antiplatelets: Secondary | ICD-10-CM | POA: Insufficient documentation

## 2022-10-01 DIAGNOSIS — R9431 Abnormal electrocardiogram [ECG] [EKG]: Secondary | ICD-10-CM | POA: Insufficient documentation

## 2022-10-01 DIAGNOSIS — K219 Gastro-esophageal reflux disease without esophagitis: Secondary | ICD-10-CM | POA: Diagnosis not present

## 2022-10-01 DIAGNOSIS — J9611 Chronic respiratory failure with hypoxia: Secondary | ICD-10-CM | POA: Diagnosis not present

## 2022-10-01 DIAGNOSIS — I2510 Atherosclerotic heart disease of native coronary artery without angina pectoris: Secondary | ICD-10-CM | POA: Insufficient documentation

## 2022-10-01 DIAGNOSIS — J810 Acute pulmonary edema: Secondary | ICD-10-CM

## 2022-10-01 DIAGNOSIS — Z1231 Encounter for screening mammogram for malignant neoplasm of breast: Secondary | ICD-10-CM

## 2022-10-01 DIAGNOSIS — J811 Chronic pulmonary edema: Secondary | ICD-10-CM | POA: Insufficient documentation

## 2022-10-01 DIAGNOSIS — Z6828 Body mass index (BMI) 28.0-28.9, adult: Secondary | ICD-10-CM | POA: Diagnosis not present

## 2022-10-01 DIAGNOSIS — R7989 Other specified abnormal findings of blood chemistry: Secondary | ICD-10-CM | POA: Diagnosis not present

## 2022-10-01 DIAGNOSIS — Z7722 Contact with and (suspected) exposure to environmental tobacco smoke (acute) (chronic): Secondary | ICD-10-CM | POA: Diagnosis not present

## 2022-10-01 DIAGNOSIS — Z955 Presence of coronary angioplasty implant and graft: Secondary | ICD-10-CM | POA: Insufficient documentation

## 2022-10-01 DIAGNOSIS — Z992 Dependence on renal dialysis: Secondary | ICD-10-CM | POA: Insufficient documentation

## 2022-10-01 DIAGNOSIS — Z7901 Long term (current) use of anticoagulants: Secondary | ICD-10-CM | POA: Diagnosis not present

## 2022-10-01 DIAGNOSIS — J45909 Unspecified asthma, uncomplicated: Secondary | ICD-10-CM | POA: Diagnosis not present

## 2022-10-01 DIAGNOSIS — I480 Paroxysmal atrial fibrillation: Secondary | ICD-10-CM | POA: Insufficient documentation

## 2022-10-01 DIAGNOSIS — I058 Other rheumatic mitral valve diseases: Secondary | ICD-10-CM

## 2022-10-01 DIAGNOSIS — E669 Obesity, unspecified: Secondary | ICD-10-CM | POA: Insufficient documentation

## 2022-10-01 LAB — BASIC METABOLIC PANEL
Anion gap: 13 (ref 5–15)
BUN: 41 mg/dL — ABNORMAL HIGH (ref 6–20)
CO2: 31 mmol/L (ref 22–32)
Calcium: 8.8 mg/dL — ABNORMAL LOW (ref 8.9–10.3)
Chloride: 95 mmol/L — ABNORMAL LOW (ref 98–111)
Creatinine, Ser: 8.63 mg/dL — ABNORMAL HIGH (ref 0.44–1.00)
GFR, Estimated: 5 mL/min — ABNORMAL LOW (ref >60)
Glucose, Bld: 90 mg/dL (ref 70–99)
Potassium: 4.5 mmol/L (ref 3.5–5.1)
Sodium: 139 mmol/L (ref 135–145)

## 2022-10-01 LAB — APTT: aPTT: 34 seconds (ref 24–36)

## 2022-10-01 LAB — TROPONIN I (HIGH SENSITIVITY)
Troponin I (High Sensitivity): 126 ng/L (ref <18)
Troponin I (High Sensitivity): 123 ng/L (ref <18)
Troponin I (High Sensitivity): 116 ng/L (ref <18)
Troponin I (High Sensitivity): 130 ng/L (ref <18)

## 2022-10-01 LAB — COMPREHENSIVE METABOLIC PANEL
ALT: 16 U/L (ref 0–44)
AST: 14 U/L — ABNORMAL LOW (ref 15–41)
Albumin: 3.2 g/dL — ABNORMAL LOW (ref 3.5–5.0)
Alkaline Phosphatase: 98 U/L (ref 38–126)
Anion gap: 19 — ABNORMAL HIGH (ref 5–15)
BUN: 42 mg/dL — ABNORMAL HIGH (ref 6–20)
CO2: 26 mmol/L (ref 22–32)
Calcium: 8.5 mg/dL — ABNORMAL LOW (ref 8.9–10.3)
Chloride: 93 mmol/L — ABNORMAL LOW (ref 98–111)
Creatinine, Ser: 9.07 mg/dL — ABNORMAL HIGH (ref 0.44–1.00)
GFR, Estimated: 5 mL/min — ABNORMAL LOW (ref >60)
Glucose, Bld: 109 mg/dL — ABNORMAL HIGH (ref 70–99)
Potassium: 4.1 mmol/L (ref 3.5–5.1)
Sodium: 138 mmol/L (ref 135–145)
Total Bilirubin: 0.5 mg/dL (ref 0.3–1.2)
Total Protein: 7.6 g/dL (ref 6.5–8.1)

## 2022-10-01 LAB — CBC
HCT: 32 % — ABNORMAL LOW (ref 36.0–46.0)
Hemoglobin: 10 g/dL — ABNORMAL LOW (ref 12.0–15.0)
MCH: 26.5 pg (ref 26.0–34.0)
MCHC: 31.3 g/dL (ref 30.0–36.0)
MCV: 84.7 fL (ref 80.0–100.0)
Platelets: 153 10*3/uL (ref 150–400)
RBC: 3.78 MIL/uL — ABNORMAL LOW (ref 3.87–5.11)
RDW: 18.6 % — ABNORMAL HIGH (ref 11.5–15.5)
WBC: 5.8 10*3/uL (ref 4.0–10.5)
nRBC: 0 % (ref 0.0–0.2)

## 2022-10-01 LAB — VITAMIN B12: Vitamin B-12: 713 pg/mL (ref 180–914)

## 2022-10-01 LAB — HIV ANTIBODY (ROUTINE TESTING W REFLEX): HIV Screen 4th Generation wRfx: NONREACTIVE

## 2022-10-01 LAB — FOLATE: Folate: 19.3 ng/mL (ref >5.9)

## 2022-10-01 LAB — HEPATITIS B SURFACE ANTIGEN: Hepatitis B Surface Ag: NONREACTIVE

## 2022-10-01 MED ORDER — MIDODRINE HCL 5 MG PO TABS
10.0000 mg | ORAL_TABLET | Freq: Three times a day (TID) | ORAL | Status: DC | PRN
  Administered 2022-10-02 – 2022-10-03 (×2): 10 mg via ORAL
  Filled 2022-10-01 (×2): qty 2

## 2022-10-01 MED ORDER — FENTANYL CITRATE PF 50 MCG/ML IJ SOSY
25.0000 ug | PREFILLED_SYRINGE | INTRAMUSCULAR | Status: DC | PRN
  Administered 2022-10-01 – 2022-10-02 (×5): 25 ug via INTRAVENOUS
  Filled 2022-10-01 (×7): qty 1

## 2022-10-01 MED ORDER — SODIUM CHLORIDE 0.9% FLUSH: 3.0000 mL | INTRAVENOUS | Status: DC | PRN

## 2022-10-01 MED ORDER — ALBUTEROL SULFATE (2.5 MG/3ML) 0.083% IN NEBU: 2.5000 mg | INHALATION_SOLUTION | RESPIRATORY_TRACT | Status: DC | PRN

## 2022-10-01 MED ORDER — FENTANYL CITRATE PF 50 MCG/ML IJ SOSY
50.0000 ug | PREFILLED_SYRINGE | Freq: Once | INTRAMUSCULAR | Status: AC
  Administered 2022-10-01: 50 ug via INTRAVENOUS
  Filled 2022-10-01: qty 1

## 2022-10-01 MED ORDER — HEPARIN SODIUM (PORCINE) 5000 UNIT/ML IJ SOLN: 4000.0000 [IU] | Freq: Once | INTRAMUSCULAR | Status: DC

## 2022-10-01 MED ORDER — ALPRAZOLAM 0.5 MG PO TABS
1.0000 mg | ORAL_TABLET | Freq: Three times a day (TID) | ORAL | Status: DC
  Administered 2022-10-01 – 2022-10-03 (×6): 1 mg via ORAL
  Filled 2022-10-01: qty 2
  Filled 2022-10-01: qty 4
  Filled 2022-10-01 (×4): qty 2

## 2022-10-01 MED ORDER — HEPARIN (PORCINE) 25000 UT/250ML-% IV SOLN
1100.0000 [IU]/h | INTRAVENOUS | Status: DC
  Administered 2022-10-01: 850 [IU]/h via INTRAVENOUS
  Filled 2022-10-01: qty 250

## 2022-10-01 MED ORDER — ACETAMINOPHEN 325 MG PO TABS: 650.0000 mg | ORAL_TABLET | Freq: Four times a day (QID) | ORAL | Status: DC | PRN

## 2022-10-01 MED ORDER — HYDROCODONE-ACETAMINOPHEN 5-325 MG PO TABS: 1.0000 | ORAL_TABLET | ORAL | Status: DC | PRN

## 2022-10-01 MED ORDER — SEVELAMER CARBONATE 800 MG PO TABS
1600.0000 mg | ORAL_TABLET | Freq: Three times a day (TID) | ORAL | Status: DC
  Administered 2022-10-01 – 2022-10-03 (×4): 1600 mg via ORAL
  Filled 2022-10-01 (×4): qty 2

## 2022-10-01 MED ORDER — CLOPIDOGREL BISULFATE 75 MG PO TABS
75.0000 mg | ORAL_TABLET | Freq: Every day | ORAL | Status: DC
  Administered 2022-10-01 – 2022-10-03 (×3): 75 mg via ORAL
  Filled 2022-10-01 (×3): qty 1

## 2022-10-01 MED ORDER — SODIUM CHLORIDE 0.9 % IV SOLN: 250.0000 mL | INTRAVENOUS | Status: DC | PRN

## 2022-10-01 MED ORDER — METOCLOPRAMIDE HCL 5 MG/ML IJ SOLN: 5.0000 mg | Freq: Four times a day (QID) | INTRAMUSCULAR | Status: DC | PRN

## 2022-10-01 MED ORDER — PANTOPRAZOLE SODIUM 40 MG PO TBEC
40.0000 mg | DELAYED_RELEASE_TABLET | Freq: Every day | ORAL | Status: DC
  Administered 2022-10-02 – 2022-10-03 (×2): 40 mg via ORAL
  Filled 2022-10-01 (×3): qty 1

## 2022-10-01 MED ORDER — ACETAMINOPHEN 650 MG RE SUPP: 650.0000 mg | Freq: Four times a day (QID) | RECTAL | Status: DC | PRN

## 2022-10-01 MED ORDER — CINACALCET HCL 30 MG PO TABS
60.0000 mg | ORAL_TABLET | Freq: Every day | ORAL | Status: DC
  Administered 2022-10-02: 60 mg via ORAL
  Filled 2022-10-01 (×2): qty 2

## 2022-10-01 MED ORDER — CHLORHEXIDINE GLUCONATE CLOTH 2 % EX PADS
6.0000 | MEDICATED_PAD | Freq: Every day | CUTANEOUS | Status: DC
  Administered 2022-10-02: 6 via TOPICAL

## 2022-10-01 MED ORDER — ATORVASTATIN CALCIUM 80 MG PO TABS
80.0000 mg | ORAL_TABLET | Freq: Every day | ORAL | Status: DC
  Administered 2022-10-02 – 2022-10-03 (×2): 80 mg via ORAL
  Filled 2022-10-01 (×2): qty 1

## 2022-10-01 MED ORDER — HEPARIN (PORCINE) 25000 UT/250ML-% IV SOLN: 850.0000 [IU]/h | INTRAVENOUS | Status: DC

## 2022-10-01 MED ORDER — SODIUM CHLORIDE 0.9% FLUSH
3.0000 mL | Freq: Two times a day (BID) | INTRAVENOUS | Status: DC
  Administered 2022-10-02: 3 mL via INTRAVENOUS

## 2022-10-01 MED ORDER — FLUTICASONE FUROATE-VILANTEROL 100-25 MCG/ACT IN AEPB
1.0000 | INHALATION_SPRAY | Freq: Every day | RESPIRATORY_TRACT | Status: DC
  Filled 2022-10-01: qty 28

## 2022-10-01 NOTE — Assessment & Plan Note (Signed)
Appreciate cardiology involvement. Patient is to be dialyzed in AM.  Currently no new oxygen requirement.  Continue to monitor

## 2022-10-01 NOTE — ED Notes (Signed)
Does not make urine

## 2022-10-01 NOTE — ED Notes (Signed)
CRITICAL VALUE STICKER  CRITICAL VALUE:  RECEIVER (on-site recipient of call):Carmelita Amparo,rn  DATE & TIME NOTIFIED: .td 1328  MESSENGER (representative from lab):  MD NOTIFIED: dr. Rosalia Hammers  TIME OF NOTIFICATION:1340  RESPONSE:

## 2022-10-01 NOTE — ED Triage Notes (Signed)
Patient here POV from Home.  Notes CP that began today. No SOB. Mid CP and Aches.   MI a few weeks ago. History of ESRD. Dialysis T/R/Sat (last was Tuesday). 3L [place] at all times.   NAD Noted during Triage. A&Ox4. Gcs 15. Ambulatory.

## 2022-10-01 NOTE — Subjective & Objective (Signed)
47 yo F w hx of CAD sp stemi 09/12/2022 with angioplasty presented w CP today  No SOB, CP is throbbing, woke her up from sleep today, pain does not radaite no N/V/D Could not get her HD due to CP No fever no chills Hx of ESRD on HD Tu/THY/Saturday skipped HD today On 3L of O2 at baseline

## 2022-10-01 NOTE — Assessment & Plan Note (Signed)
Continue home oxygen 3 L

## 2022-10-01 NOTE — ED Notes (Signed)
[facility] at [facility] will send transport for Bed ready at [facility] 6E Room#19.-ABB(NS)

## 2022-10-01 NOTE — Assessment & Plan Note (Signed)
Continue Protonix 40 mg a day

## 2022-10-01 NOTE — Assessment & Plan Note (Signed)
Appreciate cardiology consult. Continue to cycle cardiac enzymes

## 2022-10-01 NOTE — Assessment & Plan Note (Addendum)
Stable cont home meds ?

## 2022-10-01 NOTE — Assessment & Plan Note (Signed)
Check anemia panel in a.m. continue to monitor hemoglobin and transfuse as needed for hemoglobin below 7

## 2022-10-01 NOTE — ED Notes (Signed)
Report given to the next RN... 

## 2022-10-01 NOTE — H&P (Signed)
Desiree [name]:[id number] DOB: Aug 13, 1975 DOA: 10/01/2022     PCP: Jearld Lesch, MD   Outpatient Specialists:  CARDS:  Dr. Jodelle Red, MD NEphrology:   [facility]  Pulmonary  Dr. Vassie Loll     Patient arrived to ER on 10/01/22 at 1123 Referred by Attending No att. providers found   Patient coming from:    home Lives  With family    Chief Complaint:  Chief Complaint  Patient presents with   Chest Pain    HPI: Desiree [name] is a 47 y.o. female with medical history significant of CAD with anterior STEMI on 09/12/2022 s/p balloon angioplasty of distal LAD and lateral OM3, ESRD on HD tHS, HTN, GERD, anemia, seizure DO, anxiety, systolic CHF with EF 35-40% with severe mitral annular calcification, A-fib on Eliquis     Presented with   chest pain  47 yo F w hx of CAD sp stemi 09/12/2022 with angioplasty presented w CP today  No SOB, CP is throbbing, woke her up from sleep today, pain does not radaite no N/V/D Could not get her HD due to CP No fever no chills Hx of ESRD on HD Tu/THY/Saturday skipped HD today On 3L of O2 at baseline    Denies significant ETOH intake   Does not smoke  Quit smoking marijuana No Cocaine for the past 5 years   Lab Results  Component Value Date   SARSCOV2NAA NEGATIVE 03/31/2022   SARSCOV2NAA NEGATIVE 02/03/2022   SARSCOV2NAA NEGATIVE 11/09/2021   SARSCOV2NAA NEGATIVE 02/16/2021       Regarding pertinent Chronic problems:    Hyperlipidemia - on statins Lipitor (atorvastatin)  Lipid Panel     Component Value Date/Time   CHOL 107 09/14/2022 0243   TRIG 35 09/14/2022 0243   HDL 36 (L) 09/14/2022 0243   CHOLHDL 3.0 09/14/2022 0243   VLDL 7 09/14/2022 0243   LDLCALC 64 09/14/2022 0243     HTN on Imdur was recently decreased due to soft blood pressures   chronic CHF  systolic - last echo  June 2024 showed EF35 to 40%.   Left ventricular   diastolic parameters are indeterminate.   severely elevated pulmonary artery systolic   pressure.  small pericardial effusion is present.    CAD  - On Aspirin, statin, betablocker, Plavix                 -  followed by cardiology                STEMI in June 2024         Asthma -well   controlled on home inhalers/ nebs                           Pulmonary hypertension-on 3 L  baseline oxygen        A. Fib -  - CHA2DS2 vas score   4      current  on anticoagulation with  Eliquis,      -  Rate control:  Currently controlled with  Metoprolol,         not a candidate for rhythm modification given recent thromboembolic event, planned for rate control and anticoagulation  End-stage renal disease on hemodialysis Tuesday Thursday Saturday Lab Results  Component Value Date   CREATININE 8.63 (H) 10/01/2022   CREATININE 7.52 (H) 09/17/2022   CREATININE 4.83 (H) 09/16/2022     Chronic anemia - baseline hg  Hemoglobin & Hematocrit  Recent Labs    09/16/22 0113 09/17/22 0830 10/01/22 1136  HGB 11.7* 10.4* 10.0*   Iron/TIBC/Ferritin/ %Sat    Component Value Date/Time   IRON 33 12/05/2021 0917   TIBC 263 12/05/2021 0917   FERRITIN 111 01/14/2021 0219   IRONPCTSAT 13 12/05/2021 0917     Seizure DO - last seizure in 2000 post partum Not on any meds     While in ER: Clinical Course as of 10/01/22 1921  Thu Oct 01, 2022  1354 Dr. Izora Ribas with cardiology will serve as consult. He agrees with plan but it less concerned with acute ACS at this time.  He is suggesting that it is likely valvular disease or related to cardiac demand ischemia. [CF]  1557 Spoke with Dr. Alinda Money with tried hospitalist who agrees to admit the patient. [CF]  1558 CBC(!) Slight anemia but this is at the same level as her baseline. [CF]  1558 Troponin I (High Sensitivity)(!!) Initial and delta troponin are elevated but downtrending. [CF]  1558 Basic metabolic panel(!) Significantly elevated creatinine in the setting of renal failure on dialysis. [CF]  1558 DG Chest Portable 1 View I  personally ordered and interpreted the study and do not see any evidence of pneumonia or cardiomegaly. [CF]    Clinical Course User Index [CF] Teressa Lower, PA-C       Lab Orders         Basic metabolic panel         CBC         Pregnancy, urine         CBC         APTT         Heparin level (unfractionated)         APTT     CXR - Mild cardiomegaly with mild central pulmonary vascular congestion.     Following Medications were ordered in ER: Medications  heparin ADULT infusion 100 units/mL (25000 units/243mL) (has no administration in time range)  fentaNYL (SUBLIMAZE) injection 50 mcg (50 mcg Intravenous Given 10/01/22 1435)    _______________________________________________________ ER Provider Called:   Cardiology Dr. Izora Ribas with cardiology will serve as consult.   They Recommend admit to medicine   Will see on arrival   ED Triage Vitals  Encounter Vitals Group     BP 10/01/22 1134 126/72     Systolic BP Percentile --      Diastolic BP Percentile --      Pulse Rate 10/01/22 1134 (!) 56     Resp 10/01/22 1134 20     Temp 10/01/22 1134 98.3 F (36.8 C)     Temp Source 10/01/22 1517 Oral     SpO2 10/01/22 1134 100 %     Weight 10/01/22 1134 168 lb 6.9 oz (76.4 kg)     Height 10/01/22 1134 5\' 4"  (1.626 m)     Head Circumference --      Peak Flow --      Pain Score 10/01/22 1443 8     Pain Loc --      Pain Education --      Exclude from Growth Chart --   GNFA(21)@     _________________________________________ Significant initial  Findings: Abnormal Labs Reviewed  BASIC METABOLIC PANEL - Abnormal; Notable for the following components:      Result Value   Chloride 95 (*)    BUN 41 (*)    Creatinine, Ser 8.63 (*)  Calcium 8.8 (*)    GFR, Estimated 5 (*)    All other components within normal limits  CBC - Abnormal; Notable for the following components:   RBC 3.78 (*)    Hemoglobin 10.0 (*)    HCT 32.0 (*)    RDW 18.6 (*)    All other components  within normal limits  TROPONIN I (HIGH SENSITIVITY) - Abnormal; Notable for the following components:   Troponin I (High Sensitivity) 126 (*)    All other components within normal limits  TROPONIN I (HIGH SENSITIVITY) - Abnormal; Notable for the following components:   Troponin I (High Sensitivity) 123 (*)    All other components within normal limits    _________________________ Troponin  ordered Cardiac Panel (last 3 results) Recent Labs    10/01/22 1136 10/01/22 1323  TROPONINIHS 126* 123*     ECG: Ordered Personally reviewed and interpreted by me showing: HR : 57 Rhythm: Sinus bradycardia Marked T wave abnormality, consider anterolateral ischemia Prolonged QT Abnormal ECG QTC 500  BNP (last 3 results) Recent Labs    02/03/22 1708 03/31/22 1750 09/14/22 0243  BNP 518.9* 623.7* 1,704.8*     COVID-19 Labs  No results for input(s): "DDIMER", "FERRITIN", "LDH", "CRP" in the last 72 hours.  Lab Results  Component Value Date   SARSCOV2NAA NEGATIVE 03/31/2022   SARSCOV2NAA NEGATIVE 02/03/2022   SARSCOV2NAA NEGATIVE 11/09/2021   SARSCOV2NAA NEGATIVE 02/16/2021    The recent clinical data is shown below. Vitals:   10/01/22 1615 10/01/22 1700 10/01/22 1745 10/01/22 1837  BP: 123/76 119/71 109/66 119/70  Pulse: (!) 55 (!) 59 (!) 54 (!) 58  Resp: 14 16 (!) 22 20  Temp:    98.8 F (37.1 C)  TempSrc:    Oral  SpO2: 100% 100% 100% 100%  Weight:    76.4 kg  Height:    5\' 4"  (1.626 m)    WBC     Component Value Date/Time   WBC 5.8 10/01/2022 1136   LYMPHSABS 1.5 09/12/2022 2330   MONOABS 0.7 09/12/2022 2330   EOSABS 0.3 09/12/2022 2330   BASOSABS 0.0 09/12/2022 2330      Results for orders placed or performed during the hospital encounter of 09/12/22  MRSA Next Gen by PCR, Nasal     Status: None   Collection Time: 09/13/22  1:04 AM   Specimen: Nasal Mucosa; Nasal Swab  Result Value Ref Range Status   MRSA by PCR Next Gen NOT DETECTED NOT DETECTED Final     Comment: (NOTE) The GeneXpert MRSA Assay (FDA approved for NASAL specimens only), is one component of a comprehensive MRSA colonization surveillance program. It is not intended to diagnose MRSA infection nor to guide or monitor treatment for MRSA infections. Test performance is not FDA approved in patients less than 92 years old. Performed at [facility], [address] [place], [place] [address]        __________________________________________________________ Recent Labs  Lab 10/01/22 1136  NA 139  K 4.5  CO2 31  GLUCOSE 90  BUN 41*  CREATININE 8.63*  CALCIUM 8.8*    Cr  stable,   Lab Results  Component Value Date   CREATININE 8.63 (H) 10/01/2022   CREATININE 7.52 (H) 09/17/2022   CREATININE 4.83 (H) 09/16/2022    No results for input(s): "AST", "ALT", "ALKPHOS", "BILITOT", "PROT", "ALBUMIN" in the last 168 hours. Lab Results  Component Value Date   CALCIUM 8.8 (L) 10/01/2022   PHOS 5.4 (H)  09/17/2022    Plt: Lab Results  Component Value Date   PLT 153 10/01/2022       Recent Labs  Lab 10/01/22 1136  WBC 5.8  HGB 10.0*  HCT 32.0*  MCV 84.7  PLT 153    HG/HCT  stable,      Component Value Date/Time   HGB 10.0 (L) 10/01/2022 1136   HCT 32.0 (L) 10/01/2022 1136   MCV 84.7 10/01/2022 1136    No results for input(s): "LIPASE", "AMYLASE" in the last 168 hours. No results for input(s): "AMMONIA" in the last 168 hours.    _______________________________________________ Hospitalist was called for admission for   Chest pain, unspecified type    Elevated troponin in the setting of recent STEMI     The following Work up has been ordered so far:  Orders Placed This Encounter  Procedures   Critical Care   DG Chest Portable 1 View   Basic metabolic panel   CBC   Pregnancy, urine   CBC   APTT   Heparin level (unfractionated)   APTT   Document Height and Actual Weight   Cardiac Monitoring Continuous x 24 hours Indications for use: Other;  other indications for use: Chest pain, rule out ACS   heparin per pharmacy consult   Inpatient consult to Cardiology   Consult to hospitalist   ED EKG   EKG 12-Lead   EKG   EKG   Place in observation (patient's expected length of stay will be less than 2 midnights)     OTHER Significant initial  Findings:  labs showing:     DM  labs:  HbA1C: Recent Labs    09/14/22 0243  HGBA1C 5.2    CBG (last 3)  No results for input(s): "GLUCAP" in the last 72 hours.        Cultures:    Component Value Date/Time   SDES BLOOD RIGHT ANTECUBITAL 02/03/2022 2345   SPECREQUEST  02/03/2022 2345    BOTTLES DRAWN AEROBIC AND ANAEROBIC Blood Culture adequate volume   CULT  02/03/2022 2345    NO GROWTH 5 DAYS Performed at [facility], [address] [place], [place] [address]    REPTSTATUS 02/09/2022 FINAL 02/03/2022 2345     Radiological Exams on Admission: DG Chest Portable 1 View  Result Date: 10/01/2022 CLINICAL DATA:  Chest pain. EXAM: PORTABLE CHEST 1 VIEW COMPARISON:  September 12, 2022. FINDINGS: Mild cardiomegaly is noted with mild central pulmonary vascular congestion. No consolidative process is noted. Bony thorax is unremarkable. IMPRESSION: Mild cardiomegaly with mild central pulmonary vascular congestion. Electronically Signed   By: Lupita Raider M.D.   On: 10/01/2022 12:03   _______________________________________________________________________________________________________ Latest  Blood pressure 119/70, pulse (!) 58, temperature 98.8 F (37.1 C), temperature source Oral, resp. rate 20, height 5\' 4"  (1.626 m), weight 76.4 kg, SpO2 100%.   Vitals  labs and radiology finding personally reviewed  Review of Systems:    Pertinent positives include:   fatigue, chest pain, Constitutional:  No weight loss, night sweats, Fevers, chills, weight loss  HEENT:  No headaches, Difficulty swallowing,Tooth/dental problems,Sore throat,  No sneezing, itching, ear ache, nasal  congestion, post nasal drip,  Cardio-vascular:  No  Orthopnea, PND, anasarca, dizziness, palpitations.no Bilateral lower extremity swelling  GI:  No heartburn, indigestion, abdominal pain, nausea, vomiting, diarrhea, change in bowel habits, loss of appetite, melena, blood in stool, hematemesis Resp:  no shortness of breath at rest. No dyspnea on exertion, No excess mucus,  no productive cough, No non-productive cough, No coughing up of blood.No change in color of mucus.No wheezing. Skin:  no rash or lesions. No jaundice GU:  no dysuria, change in color of urine, no urgency or frequency. No straining to urinate.  No flank pain.  Musculoskeletal:  No joint pain or no joint swelling. No decreased range of motion. No back pain.  Psych:  No change in mood or affect. No depression or anxiety. No memory loss.  Neuro: no localizing neurological complaints, no tingling, no weakness, no double vision, no gait abnormality, no slurred speech, no confusion  All systems reviewed and apart from HOPI all are negative _______________________________________________________________________________________________ Past Medical History:   Past Medical History:  Diagnosis Date   Anemia    Anxiety    panic attacks   Arthritis    bilateral knees   Childhood asthma    Complication of anesthesia    "sometimes it does not work; didn't during LEEP OR" (01/21/2016)   Depression    no med   ESRD (end stage renal disease) on dialysis (HCC)    "TTS; [facility]; [facility]" (08/03/2016)   GERD (gastroesophageal reflux disease)    nexium prn   Gout    History of blood transfusion    "related to kidneys; I've had 4" (01/21/2016)   Hypertension    Migraine    last one 01/18/19   Preterm labor ~ 2014   Retina hole, left    Seizures (HCC)    "last one was in 2000; related to preeclampsia" (01/21/2016)     Past Surgical History:  Procedure Laterality Date   A/V FISTULAGRAM Left 12/19/2021   Procedure:  A/V Fistulagram;  Surgeon: Chuck Hint, MD;  Location: [facility] INVASIVE CV LAB;  Service: Cardiovascular;  Laterality: Left;   AV FISTULA PLACEMENT Left 2010   AV FISTULA PLACEMENT Left 01/20/2019   Procedure: ARTERIOVENOUS (AV) FISTULA CREATION LEFT UPPER ARM;  Surgeon: Chuck Hint, MD;  Location: [facility] OR;  Service: Vascular;  Laterality: Left;   AV FISTULA PLACEMENT Left 12/19/2021   Procedure: ARTERIOVENOUS (AV) FISTULA CREATION POSSIBLE TUNNELED DIALYSIS;  Surgeon: Maeola Harman, MD;  Location: [facility] OR;  Service: Vascular;  Laterality: Left;   CERVICAL BIOPSY  W/ LOOP ELECTRODE EXCISION  2001   CORONARY/GRAFT ACUTE MI REVASCULARIZATION N/A 09/12/2022   Procedure: Coronary/Graft Acute MI Revascularization;  Surgeon: Corky Crafts, MD;  Location: [facility] INVASIVE CV LAB;  Service: Cardiovascular;  Laterality: N/A;   DILATION AND EVACUATION  08/02/2011   Procedure: DILATATION AND EVACUATION;  Surgeon: Oliver Pila, MD;  Location: [facility] ORS;  Service: Gynecology;;   DILATION AND EVACUATION N/A 08/31/2013   Procedure: DILATATION AND EVACUATION;  Surgeon: Adam Phenix, MD;  Location: [facility] ORS;  Service: Gynecology;  Laterality: N/A;   FISTULA SUPERFICIALIZATION Left 01/20/2019   Procedure: Fistula Superficialization;  Surgeon: Chuck Hint, MD;  Location: [facility] OR;  Service: Vascular;  Laterality: Left;   HYDRADENITIS EXCISION Right    INSERTION OF DIALYSIS CATHETER Right 12/19/2021   Procedure: INSERTION OF A TUNNELED DIALYSIS CATHETER;  Surgeon: Maeola Harman, MD;  Location: [facility] OR;  Service: Vascular;  Laterality: Right;   LEFT HEART CATH AND CORONARY ANGIOGRAPHY N/A 09/12/2022   Procedure: LEFT HEART CATH AND CORONARY ANGIOGRAPHY;  Surgeon: Corky Crafts, MD;  Location: [facility] INVASIVE CV LAB;  Service: Cardiovascular;  Laterality: N/A;   RENAL BIOPSY     REVISION OF ARTERIOVENOUS GORETEX GRAFT Left 02/11/2013   Procedure: REVISION  OF ARTERIOVENOUS  GORTEX FISTULA;  Surgeon: Larina Earthly, MD;  Location: [facility] OR;  Service: Vascular;  Laterality: Left;   THROMBECTOMY W/ EMBOLECTOMY Left 05/25/2018   Procedure: REPAIR OF BLEEDING ARTERIOVENOUS FISTULA;  Surgeon: Nada Libman, MD;  Location: [facility] OR;  Service: Vascular;  Laterality: Left;    Social History:  Ambulatory   independently     reports that she has never smoked. She has been exposed to tobacco smoke. She has never used smokeless tobacco. She reports current drug use. Drug: Marijuana. She reports that she does not drink alcohol.   Family History:   Family History  Problem Relation Age of Onset   Diabetes Mother    Hyperlipidemia Mother    Hypertension Mother    Heart disease Mother    Hypertension Other    Diabetes type II Other    ______________________________________________________________________________________________ Allergies: Allergies  Allergen Reactions   Morphine Shortness Of Breath and Anaphylaxis   Prednisone Other (See Comments)    Other reaction(s): Other (See Comments) Muscle spasms Patient says prednisone causes her to cramp all over, muscle spasms uncontrolled   Tuna [Fish Allergy] Itching, Swelling, Rash and Other (See Comments)    Face droops also   Amlodipine Other (See Comments)    Angioedema (09/05/17 ED visit)   Iodinated Contrast Media Itching   Tape Itching    Adhesive tape   paper tape ok     Prior to Admission medications   Medication Sig Start Date End Date Taking? Authorizing Provider  acetaminophen (TYLENOL) 325 MG tablet Take 325 mg by mouth every 6 (six) hours as needed.   Yes [provider]  albuterol (PROVENTIL HFA;VENTOLIN HFA) 108 (90 Base) MCG/ACT inhaler Inhale 2 puffs into the lungs every 4 (four) hours as needed for wheezing or shortness of breath. 05/02/16  Yes Lavera Guise, MD  ALPRAZolam Prudy Feeler) 1 MG tablet Take 1 mg by mouth 3 (three) times daily. 11/25/21  Yes [provider]  apixaban (ELIQUIS) 5  MG TABS tablet Take 1 tablet (5 mg total) by mouth 2 (two) times daily. 09/28/22  Yes Alver Sorrow, NP  atorvastatin (LIPITOR) 80 MG tablet Take 1 tablet (80 mg total) by mouth daily. 09/28/22  Yes Alver Sorrow, NP  cinacalcet (SENSIPAR) 30 MG tablet Take 2 tablets (60 mg total) by mouth daily with supper. 09/17/22  Yes Corky Crafts, MD  clopidogrel (PLAVIX) 75 MG tablet Take 1 tablet (75 mg total) by mouth daily with breakfast. 09/17/22  Yes Jonita Albee, PA-C  cyclobenzaprine (FLEXERIL) 10 MG tablet Take 10 mg by mouth 2 (two) times daily as needed for muscle spasms.   Yes [provider]  ferric citrate (AURYXIA) 1 GM 210 MG(Fe) tablet Take 1 tablet (210 mg total) by mouth 3 (three) times daily with meals. 09/17/22  Yes Corky Crafts, MD  fluticasone furoate-vilanterol (BREO ELLIPTA) 100-25 MCG/ACT AEPB Inhale 1 puff into the lungs daily. 04/30/22  Yes Oretha Milch, MD  folic acid-vitamin b complex-vitamin c-selenium-zinc (DIALYVITE) 3 MG TABS tablet Take 1 tablet by mouth daily.   Yes [provider]  isosorbide mononitrate (IMDUR) 30 MG 24 hr tablet Take a half tablet in the morning on non-dialysis days. 09/28/22  Yes Alver Sorrow, NP  lactulose (CHRONULAC) 10 GM/15ML solution Take 30 mLs by mouth daily as needed for mild constipation, moderate constipation or severe constipation. 09/04/21  Yes [provider]  loperamide (IMODIUM) 2 MG capsule Take 2  mg by mouth as needed for diarrhea or loose stools.   Yes [provider]  lurasidone (LATUDA) 20 MG TABS tablet Take 20 mg by mouth daily. 09/09/22  Yes [provider]  metoprolol tartrate (LOPRESSOR) 25 MG tablet Take 0.5 tablets (12.5 mg total) by mouth 2 (two) times daily. 09/28/22  Yes Alver Sorrow, NP  midodrine (PROAMATINE) 10 MG tablet Take 10 mg by mouth 3 (three) times daily as needed (Hypotension). 09/03/22  Yes [provider]  mupirocin ointment  (BACTROBAN) 2 % Apply 1 Application topically 2 (two) times daily. 06/09/22  Yes [provider]  ondansetron (ZOFRAN-ODT) 8 MG disintegrating tablet Take 8 mg by mouth 2 (two) times daily.   Yes [provider]  Oxycodone HCl 10 MG TABS Take 1 tablet (10 mg total) by mouth 3 (three) times daily as needed (severe pain). Patient taking differently: Take 10 mg by mouth 3 (three) times daily. 12/22/21  Yes Arrien, York Ram, MD  pantoprazole (PROTONIX) 40 MG tablet Take 1 tablet (40 mg total) by mouth daily. 09/28/22 03/27/23 Yes Alver Sorrow, NP  sevelamer carbonate (RENVELA) 800 MG tablet Take 1,600 mg by mouth 3 (three) times daily. 05/18/19  Yes [provider]  NARCAN 4 MG/0.1ML LIQD nasal spray kit Place 1 spray into the nose as needed (accidental overdose). 11/14/18   [provider]    ___________________________________________________________________________________________________ Physical Exam:    10/01/2022    6:37 PM 10/01/2022    5:45 PM 10/01/2022    5:00 PM  Vitals with BMI  Height 5\' 4"     Weight 168 lbs 7 oz    BMI 28.9    Systolic 119 109 540  Diastolic 70 66 71  Pulse 58 54 59     1. General:  in No  Acute distress   Chronically ill -appearing 2. Psychological: Alert and    Oriented 3. Head/ENT:   Moist   Mucous Membranes                          Head Non traumatic, neck supple                          Normal   Dentition 4. SKIN: normal   Skin turgor,  Skin clean Dry and intact no rash    5. Heart: Regular rate and rhythm   Murmur, no Rub or gallop 6. Lungs:  no wheezes  some crackles   7. Abdomen: Soft,  non-tender, distended   obese  bowel sounds present 8. Lower extremities: no clubbing, cyanosis trace   edema 9. Neurologically Grossly intact, moving all 4 extremities equally   10. MSK: Normal range of motion    Chart has been  reviewed  ______________________________________________________________________________________________  Assessment/Plan 47 y.o. female with medical history significant of CAD with anterior STEMI on 09/12/2022 s/p balloon angioplasty of distal LAD and lateral OM3, ESRD on HD tHS, HTN, GERD, anemia, seizure DO, anxiety, systolic CHF with EF 35-40% with severe mitral annular calcification, A-fib on Eliquis    Admitted for   Chest pain in the setting of recent STEMI Elevated troponin     Present on Admission:  Chest pain, rule out acute myocardial infarction  Depression  Atrial fibrillation (HCC)  GERD (gastroesophageal reflux disease)  Anemia of chronic kidney failure  Pulmonary edema  Prolonged QT interval  Acute on chronic systolic CHF (congestive heart failure) (HCC)  Chronic respiratory failure with hypoxia (HCC)  Nonrheumatic mitral valve regurgitation  Elevated troponin  Chronic pain  Anxiety    ESRD (end stage renal disease) on dialysis [facility]) Let Nephrology know pt is coming in  Plan for HD in AM  Depression Stable cont home meds  Atrial fibrillation (HCC) For right now on heparin will hold Eliquis Resume lopressor 12.5 bid if BP allows Defer to cardiology when stable to resume  GERD (gastroesophageal reflux disease) Continue Protonix 40 mg a day  Chest pain, rule out acute myocardial infarction Appreciate cardiology consult. Continue to cycle cardiac enzymes  Anemia of chronic kidney failure Check anemia panel in a.m. continue to monitor hemoglobin and transfuse as needed for hemoglobin below 7  Pulmonary edema Mild in the setting of missing hemodialysis.  Plan for hemodialysis in the morning  Prolonged QT interval - will monitor on tele avoid QT prolonging medications, rehydrate correct electrolytes   Acute on chronic systolic CHF (congestive heart failure) (HCC) Appreciate cardiology involvement. Patient is to be dialyzed in AM.  Currently no new  oxygen requirement.  Continue to monitor  Chronic respiratory failure with hypoxia (HCC) Continue home oxygen 3 L  Nonrheumatic mitral valve regurgitation As per cardiology  Chronic pain Patient usually takes oxycodone 10 mg p.o. 3 times daily scheduled Currently soft blood pressures and bradycardic will hold off Patient appears to be somewhat calm and sedated   Elevated troponin Appreciate cardiology input Repeat echogram in the morning On IV heparin for now Continue Lopressor 12.5 mg twice a day and Imdur 15mg  on nondialysis days. Given need for dialysis in a.m. and soft blood pressures and bradycardia will hold off for tonight unless written by cardiology  Also continue Plavix And Lipitor 80 daily  Anxiety Continue Xanax 1 mg 3 times daily    Other plan as per orders.  DVT prophylaxis: On heparin   Code Status:    Code Status: Prior FULL CODE   as per patient   I had personally discussed CODE STATUS with patient   ACP none   Family Communication:   Family not at  Bedside    Diet  renal diet   Disposition Plan:       To home once workup is complete and patient is stable   Following barriers for discharge:                            Electrolytes corrected                                                           Pain controlled with PO medications                                                         Will need consultants to evaluate patient prior to discharge        Consult Orders  (From admission, onward)           Start     Ordered   10/01/22 1540  Consult to hospitalist  Kylie at [facility] will have Hospitalist Consult with  PA-Flemming.-ABB(NS)  Once       Provider:  (Not yet assigned)  Question Answer Comment  Place call to: [facility]   Reason for Consult Admit      10/01/22 1539                               Consults called: Cardiology seen patient on arrival Notified nephrology will see patient in a.m. plan for  hemodialysis   Admission status:  ED Disposition     ED Disposition  Admit   Condition  --   Comment  Hospital Area: [facility] [[id number]]  Level of Care: Telemetry Cardiac [103]  Interfacility transfer: Yes  May place patient in observation at [facility] or [facility] if equivalent level of care is available:: No  Covid Evaluation: Asymptomatic - no recent exposure (last 10 days) testing not required  Diagnosis: Chest pain, rule out acute myocardial infarction [[id number]]  Admitting Physician: Synetta Fail [[id number]]  Attending Physician: Synetta Fail [[id number]]           Obs     Level of care     tele  For  24H       Zamorah Ailes 10/01/2022, 9:32 PM    [facility]     after 2 AM please page floor coverage PA If 7AM-7PM, please contact the day team taking care of the patient using [email or web address]

## 2022-10-01 NOTE — ED Provider Notes (Signed)
[place] EMERGENCY DEPARTMENT AT [facility] Provider Note   CSN: [id number] Arrival date & time: 10/01/22  1123     History Chief Complaint  Patient presents with   Chest Pain    Desiree [name] is a 47 y.o. female patient on dialysis Tuesday, Thursday, Saturday, myocardial infarction late June who presents to the emergency department today for further evaluation of substernal chest pain which she describes as a throbbing sensation.  This started when she was startled this morning causing her to wake up.  Pain does not radiate.  She states that this is in the same location as her previous heart attack although it does feel different.  She denies any nausea, vomiting, diarrhea.  Patient was due for dialysis today but did not go secondary to her chest pain.  She denies any shortness of breath, fever, chills.   Chest Pain      Home Medications Prior to Admission medications   Medication Sig Start Date End Date Taking? Authorizing Provider  acetaminophen (TYLENOL) 325 MG tablet Take 325 mg by mouth every 6 (six) hours as needed.   Yes [provider]  albuterol (PROVENTIL HFA;VENTOLIN HFA) 108 (90 Base) MCG/ACT inhaler Inhale 2 puffs into the lungs every 4 (four) hours as needed for wheezing or shortness of breath. 05/02/16  Yes Lavera Guise, MD  ALPRAZolam Prudy Feeler) 1 MG tablet Take 1 mg by mouth 3 (three) times daily. 11/25/21  Yes [provider]  apixaban (ELIQUIS) 5 MG TABS tablet Take 1 tablet (5 mg total) by mouth 2 (two) times daily. 09/28/22  Yes Alver Sorrow, NP  atorvastatin (LIPITOR) 80 MG tablet Take 1 tablet (80 mg total) by mouth daily. 09/28/22  Yes Alver Sorrow, NP  cinacalcet (SENSIPAR) 30 MG tablet Take 2 tablets (60 mg total) by mouth daily with supper. 09/17/22  Yes Corky Crafts, MD  clopidogrel (PLAVIX) 75 MG tablet Take 1 tablet (75 mg total) by mouth daily with breakfast. 09/17/22  Yes Jonita Albee, PA-C   cyclobenzaprine (FLEXERIL) 10 MG tablet Take 10 mg by mouth 2 (two) times daily as needed for muscle spasms.   Yes [provider]  ferric citrate (AURYXIA) 1 GM 210 MG(Fe) tablet Take 1 tablet (210 mg total) by mouth 3 (three) times daily with meals. 09/17/22  Yes Corky Crafts, MD  fluticasone furoate-vilanterol (BREO ELLIPTA) 100-25 MCG/ACT AEPB Inhale 1 puff into the lungs daily. 04/30/22  Yes Oretha Milch, MD  folic acid-vitamin b complex-vitamin c-selenium-zinc (DIALYVITE) 3 MG TABS tablet Take 1 tablet by mouth daily.   Yes [provider]  isosorbide mononitrate (IMDUR) 30 MG 24 hr tablet Take a half tablet in the morning on non-dialysis days. 09/28/22  Yes Alver Sorrow, NP  lactulose (CHRONULAC) 10 GM/15ML solution Take 30 mLs by mouth daily as needed for mild constipation, moderate constipation or severe constipation. 09/04/21  Yes [provider]  loperamide (IMODIUM) 2 MG capsule Take 2 mg by mouth as needed for diarrhea or loose stools.   Yes [provider]  lurasidone (LATUDA) 20 MG TABS tablet Take 20 mg by mouth daily. 09/09/22  Yes [provider]  metoprolol tartrate (LOPRESSOR) 25 MG tablet Take 0.5 tablets (12.5 mg total) by mouth 2 (two) times daily. 09/28/22  Yes Alver Sorrow, NP  midodrine (PROAMATINE) 10 MG tablet Take 10 mg by mouth 3 (three) times daily as needed (Hypotension). 09/03/22  Yes [provider]  mupirocin  ointment (BACTROBAN) 2 % Apply 1 Application topically 2 (two) times daily. 06/09/22  Yes [provider]  ondansetron (ZOFRAN-ODT) 8 MG disintegrating tablet Take 8 mg by mouth 2 (two) times daily.   Yes [provider]  Oxycodone HCl 10 MG TABS Take 1 tablet (10 mg total) by mouth 3 (three) times daily as needed (severe pain). Patient taking differently: Take 10 mg by mouth 3 (three) times daily. 12/22/21  Yes Arrien, York Ram, MD  pantoprazole (PROTONIX) 40 MG tablet  Take 1 tablet (40 mg total) by mouth daily. 09/28/22 03/27/23 Yes Alver Sorrow, NP  sevelamer carbonate (RENVELA) 800 MG tablet Take 1,600 mg by mouth 3 (three) times daily. 05/18/19  Yes [provider]  NARCAN 4 MG/0.1ML LIQD nasal spray kit Place 1 spray into the nose as needed (accidental overdose). 11/14/18   [provider]      Allergies    Morphine, Prednisone, [place] allergy], Amlodipine, Iodinated contrast media, and Tape    Review of Systems   Review of Systems  Cardiovascular:  Positive for chest pain.  All other systems reviewed and are negative.   Physical Exam Updated Vital Signs BP 129/76   Pulse (!) 57   Temp 98 F (36.7 C) (Oral)   Resp 18   Ht 5\' 4"  (1.626 m)   Wt 76.4 kg   SpO2 100%   BMI 28.91 kg/m  Physical Exam Vitals and nursing note reviewed.  Constitutional:      General: She is not in acute distress.    Appearance: Normal appearance.  HENT:     Head: Normocephalic and atraumatic.  Eyes:     General:        Right eye: No discharge.        Left eye: No discharge.  Cardiovascular:     Comments: Regular rate and rhythm.  S1/S2 are distinct without any evidence of murmur, rubs, or gallops.  Radial pulses are 2+ bilaterally.  Dorsalis pedis pulses are 2+ bilaterally.  No evidence of pedal edema. Pulmonary:     Comments: Clear to auscultation bilaterally.  Normal effort.  No respiratory distress.  No evidence of wheezes, rales, or rhonchi heard throughout. Chest:     Comments: Palpation of the anterior substernal chest wall does reproduce the patient's pain. Abdominal:     General: Abdomen is flat. Bowel sounds are normal. There is no distension.     Tenderness: There is no abdominal tenderness. There is no guarding or rebound.  Musculoskeletal:        General: Normal range of motion.     Cervical back: Neck supple.  Skin:    General: Skin is warm and dry.     Findings: No rash.  Neurological:     General: No focal deficit  present.     Mental Status: She is alert.  Psychiatric:        Mood and Affect: Mood normal.        Behavior: Behavior normal.     ED Results / Procedures / Treatments   Labs (all labs ordered are listed, but only abnormal results are displayed) Labs Reviewed  BASIC METABOLIC PANEL - Abnormal; Notable for the following components:      Result Value   Chloride 95 (*)    BUN 41 (*)    Creatinine, Ser 8.63 (*)    Calcium 8.8 (*)    GFR, Estimated 5 (*)    All other components within normal limits  CBC - Abnormal; Notable for the following components:   RBC 3.78 (*)    Hemoglobin 10.0 (*)    HCT 32.0 (*)    RDW 18.6 (*)    All other components within normal limits  TROPONIN I (HIGH SENSITIVITY) - Abnormal; Notable for the following components:   Troponin I (High Sensitivity) 126 (*)    All other components within normal limits  TROPONIN I (HIGH SENSITIVITY) - Abnormal; Notable for the following components:   Troponin I (High Sensitivity) 123 (*)    All other components within normal limits  APTT  PREGNANCY, URINE  HEPARIN LEVEL (UNFRACTIONATED)  APTT    EKG EKG Interpretation Date/Time:  Thursday October 01 2022 11:38:20 EDT Ventricular Rate:  57 PR Interval:  158 QRS Duration:  72 QT Interval:  512 QTC Calculation: 498 R Axis:   49  Text Interpretation: Sinus bradycardia Inferior infarct , age undetermined Cannot rule out Anterior infarct (cited on or before 01-Oct-2022) T wave abnormality, consider lateral ischemia Abnormal ECG When compared with ECG of 28-Sep-2022 09:11, Inferior infarct is now Present Serial changes of Anterior infarct Present Confirmed by Margarita Grizzle [phone]) on 10/01/2022 1:38:35 PM  Radiology DG Chest Portable 1 View  Result Date: 10/01/2022 CLINICAL DATA:  Chest pain. EXAM: PORTABLE CHEST 1 VIEW COMPARISON:  September 12, 2022. FINDINGS: Mild cardiomegaly is noted with mild central pulmonary vascular congestion. No consolidative process is noted. Bony  thorax is unremarkable. IMPRESSION: Mild cardiomegaly with mild central pulmonary vascular congestion. Electronically Signed   By: Lupita Raider M.D.   On: 10/01/2022 12:03    Procedures .Critical Care  Performed by: Teressa Lower, PA-C Authorized by: Teressa Lower, PA-C   Critical care provider statement:    Critical care time (minutes):  35   Critical care time was exclusive of:  Separately billable procedures and treating other patients and teaching time   Critical care was necessary to treat or prevent imminent or life-threatening deterioration of the following conditions:  Cardiac failure   Critical care was time spent personally by me on the following activities:  Blood draw for specimens, development of treatment plan with patient or surrogate, discussions with consultants, ordering and performing treatments and interventions, ordering and review of laboratory studies, ordering and review of radiographic studies and pulse oximetry     Medications Ordered in ED Medications  heparin ADULT infusion 100 units/mL (25000 units/265mL) (has no administration in time range)  fentaNYL (SUBLIMAZE) injection 50 mcg (50 mcg Intravenous Given 10/01/22 1435)    ED Course/ Medical Decision Making/ A&P Clinical Course as of 10/01/22 1601  Thu Oct 01, 2022  1354 Dr. Izora Ribas with cardiology will serve as consult. He agrees with plan but it less concerned with acute ACS at this time.  He is suggesting that it is likely valvular disease or related to cardiac demand ischemia. [CF]  1557 Spoke with Dr. Alinda Money with tried hospitalist who agrees to admit the patient. [CF]  1558 CBC(!) Slight anemia but this is at the same level as her baseline. [CF]  1558 Troponin I (High Sensitivity)(!!) Initial and delta troponin are elevated but downtrending. [CF]  1558 Basic metabolic panel(!) Significantly elevated creatinine in the setting of renal failure on dialysis. [CF]  1558 DG Chest Portable 1  View I personally ordered and interpreted the study and do not see any evidence of pneumonia or cardiomegaly. [CF]    Clinical Course User Index [CF] Teressa Lower, PA-C   {  Click here for ABCD2, HEART and other calculators  Medical Decision Making Izabellah LOUCINDA CROY is a 47 y.o. female patient who presents to the emergency department today for further evaluation of substernal chest pain.  Given the patient's recent history of myocardial infarction within the last month we will plan to get EKG, chest pain labs in addition to a chest x-ray.  Patient is resting comfortably in the emergency department in no acute distress at this time.  Patient does have elevated troponins in the setting of chest pain and with the most recent heart attack less than 30 days I do feel the patient would likely benefit from further evaluation in the hospital.  I will preemptively start her on a modified dose of heparin considering that she is on apixaban per pharmacy recommendations.  I spoke with cardiology and tried hospitalist who agrees to see her in consult and admit the patient respectively.  Patient is stable for transport and for admission.  Amount and/or Complexity of Data Reviewed Labs: ordered. Radiology: ordered.  Risk Prescription drug management.    Final Clinical Impression(s) / ED Diagnoses Final diagnoses:  Chest pain, unspecified type  Elevated troponin    Rx / DC Orders ED Discharge Orders     None         Teressa Lower, [place] 10/01/22 1601    Margarita Grizzle, MD 10/02/22 [phone]

## 2022-10-01 NOTE — Assessment & Plan Note (Signed)
Let Nephrology know pt is coming in  Plan for HD in AM

## 2022-10-01 NOTE — Assessment & Plan Note (Signed)
Mild in the setting of missing hemodialysis.  Plan for hemodialysis in the morning

## 2022-10-01 NOTE — Assessment & Plan Note (Signed)
As per cardiology 

## 2022-10-01 NOTE — ED Notes (Signed)
Pt aware of the need for a urine... Unable to currently provide the sample... 

## 2022-10-01 NOTE — Plan of Care (Signed)
  Problem: Health Behavior/Discharge Planning: Goal: Ability to safely manage health-related needs after discharge will improve Outcome: Progressing   Problem: Education: Goal: Knowledge of General Education information will improve Description: Including pain rating scale, medication(s)/side effects and non-pharmacologic comfort measures Outcome: Progressing   Problem: Clinical Measurements: Goal: Cardiovascular complication will be avoided Outcome: Progressing   Problem: Pain Managment: Goal: General experience of comfort will improve Outcome: Progressing   Problem: Safety: Goal: Ability to remain free from injury will improve Outcome: Progressing   Problem: Skin Integrity: Goal: Risk for impaired skin integrity will decrease Outcome: Progressing

## 2022-10-01 NOTE — Assessment & Plan Note (Addendum)
For right now on heparin will hold Eliquis Resume lopressor 12.5 bid if BP allows Defer to cardiology when stable to resume

## 2022-10-01 NOTE — Progress Notes (Addendum)
ANTICOAGULATION CONSULT NOTE - Initial Consult  Pharmacy Consult for Heparin infusion Indication: chest pain/ACS  Allergies  Allergen Reactions   Morphine Shortness Of Breath and Anaphylaxis   Prednisone Other (See Comments)    Other reaction(s): Other (See Comments) Muscle spasms Patient says prednisone causes her to cramp all over, muscle spasms uncontrolled   Tuna [Fish Allergy] Itching, Swelling, Rash and Other (See Comments)    Face droops also   Amlodipine Other (See Comments)    Angioedema (09/05/17 ED visit)   Iodinated Contrast Media Itching   Tape Itching    Adhesive tape   paper tape ok    Patient Measurements: Height: 5\' 4"  (162.6 cm) Weight: 76.4 kg (168 lb 6.9 oz) IBW/kg (Calculated) : 54.7 Heparin Dosing Weight: 70.8 kg  Vital Signs: Temp: 98.3 F (36.8 C) (07/18 1134) BP: 119/72 (07/18 1230) Pulse Rate: 57 (07/18 1230)  Labs: Recent Labs    10/01/22 1136  HGB 10.0*  HCT 32.0*  PLT 153  CREATININE 8.63*  TROPONINIHS 126*    Estimated Creatinine Clearance: 8.2 mL/min (A) (by C-G formula based on SCr of 8.63 mg/dL (H)).   Medical History: Past Medical History:  Diagnosis Date   Anemia    Anxiety    panic attacks   Arthritis    bilateral knees   Childhood asthma    Complication of anesthesia    "sometimes it does not work; didn't during LEEP OR" (01/21/2016)   Depression    no med   ESRD (end stage renal disease) on dialysis (HCC)    "TTS; [facility]; [facility]" (08/03/2016)   GERD (gastroesophageal reflux disease)    nexium prn   Gout    History of blood transfusion    "related to kidneys; I've had 4" (01/21/2016)   Hypertension    Migraine    last one 01/18/19   Preterm labor ~ 2014   Retina hole, left    Seizures (HCC)    "last one was in 2000; related to preeclampsia" (01/21/2016)    Medications:  (Not in a hospital admission)   Assessment: 47 yo F presents with chest pain that started 7/18 AM. Pt reports history of MI  a few weeks ago and history of ESRD with HD on TTSa (last HD on 09/29/22). Patient also was diagnosed with atrial fibrillation and takes apixaban 5mg  BID. Last dose of apixaban was 07:30 on 10/01/22. Pharmacy consulted to dose heparin infusion per ACS protocol.  Hgb 10, Plt 153 No s/sx of bleeding  Goal of Therapy:  Heparin level 0.3-0.7 units/ml Monitor platelets by anticoagulation protocol: Yes   Plan:  No heparin bolus with recent apixaban administration Start heparin infusion at 850 units/hr at 19:30 on 7/18 Check baseline aPTT and heparin level Check aPTT in 6 hours Monitor daily CBC, heparin level, and for s/sx of bleeding   Wilburn Cornelia, PharmD, BCPS Clinical Pharmacist 10/01/2022 2:21 PM   Please refer to AMION for pharmacy phone number

## 2022-10-01 NOTE — Assessment & Plan Note (Signed)
-  will monitor on tele avoid QT prolonging medications, rehydrate correct electrolytes ? ?

## 2022-10-01 NOTE — Consult Note (Addendum)
Cardiology Consultation   Patient ID: Desiree [name] MRN: [id number]; DOB: [birth year]  Admit date: 10/01/2022 Date of Consult: 10/01/2022  PCP:  Desiree Lesch, MD   [facility] Providers Cardiologist:  Desiree Red, MD   {  Patient Profile:   Desiree [name] is a 47 y.o. female with a history of CAD with anterior STEMI on 09/12/2022 s/p balloon angioplasty of distal LAD and lateral OM3, childhood asthma, chronic respiratory failure on 3L of home O2, hypertension, ESRD on hemodialysis T/Th/Sat, GERD, anemia, migraines, seizure disorder, and anxiety who is being seen 10/01/2022 for the evaluation of chest pain at the request of Desiree [name].  History of Present Illness:   Desiree [name] is a 47 year old female with the above history who is followed by Desiree [name]. She was recently admitted from 09/12/2022 to 09/17/2022 for anterior STEMI after presenting with severe crushing chest pain. LHC showed no atherosclerotic CAD but 100% stenosis of distal LAD and 100% of lateral OM3 lesion as well as 50% stenosis of mid LCx that appeared to be from embolization of thrombus down the left main. She underwent balloon angioplasty of both the distal LAD and OM3 lesion. Echo showed LVEF of 35-40% with akinesis of the apical lateral segment, apical inferior segment, and apex as well as hypokinesis of the entire anterior wall, mid and distal anterior septum, inferior septum, and posterior wall. 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 Lower extremity dopplers were negative for DVT.  Hospitalization was also significant for new onset atrial fibrillation. She was started on Plavix and Eliquis. GDMT was limited due to soft BP. She was last seen by Desiree Shields, NP, on 09/28/2022 at which time she had multiple concerns  with biggest one being panic attacks. She did report some chest pain at that time but was not sure if it was chest from her panic attacks. She also reported episodes of hypotension. Imdur was decreased and she was referred to Psychiatry.  Patient presented to the [name] ED earlier today for recurrent chest pain. Upon arrival to the ED, vitals stable. EKG showed sinus bradycardia, rate 57 bpm, with T wave inversions in inferior leads and leads V4-V6. No acute changes compared to prior tracing. High-sensitivity troponin mildly elevated and flat at 126 >> 123. Chest x-ray showed mild cardiomegaly with mild central pulmonary vascular congestion. WBC 5.8, Hgb 10.0, Plts 153. Na 139, K 4.5, Glucose 90, BUN 41, Cr 8.63. She was started on IV Heparin and admitted to [facility] under Internal Medicine service. Cardiology consulted for further evaluation.  At the time of this evaluation, patient is resting comfortably no acute distress.  She has been having intermittent chest pain since her PCI at the end of June as described above but these may be related to panic attacks.  She was supposed to go to dialysis this morning but overslept.  When she woke up, she was having some abdominal pain and chest pain.  She went to the bathroom and had a bowel movement and the abdominal pain went away. However, she continued to have chest pain that she describes as a 10 out of 10 in nagging sensation in the center of her chest.  She denies any radiation to her neck or arms.  She states this does feel different than  her symptoms prior to her recent MI.  She denies any associated shortness of breath, nausea, vomiting. No new or worsening shortness of breath from baseline. She reports palpitations during panic attacks but none outside these times. No lightheadedness, dizziness, or syncope. No abnormal bleeding in stools. She does not make any urine. She does report very mild swelling in her ankles which is unusual for her as  well as a headache which she states she usually has when she is volume up. She states when she went for her dialysis session on Tuesday they actually had to give her IV fluids because her BP was so low.  Past Medical History:  Diagnosis Date   Anemia    Anxiety    panic attacks   Arthritis    bilateral knees   Childhood asthma    Complication of anesthesia    "sometimes it does not work; didn't during LEEP OR" (01/21/2016)   Depression    no med   ESRD (end stage renal disease) on dialysis (HCC)    "TTS; [facility]; [facility]" (08/03/2016)   GERD (gastroesophageal reflux disease)    nexium prn   Gout    History of blood transfusion    "related to kidneys; I've had 4" (01/21/2016)   Hypertension    Migraine    last one 01/18/19   Preterm labor ~ 2014   Retina hole, left    Seizures (HCC)    "last one was in 2000; related to preeclampsia" (01/21/2016)    Past Surgical History:  Procedure Laterality Date   A/V FISTULAGRAM Left 12/19/2021   Procedure: A/V Fistulagram;  Surgeon: Chuck Hint, MD;  Location: [facility] INVASIVE CV LAB;  Service: Cardiovascular;  Laterality: Left;   AV FISTULA PLACEMENT Left 2010   AV FISTULA PLACEMENT Left 01/20/2019   Procedure: ARTERIOVENOUS (AV) FISTULA CREATION LEFT UPPER ARM;  Surgeon: Chuck Hint, MD;  Location: [facility] OR;  Service: Vascular;  Laterality: Left;   AV FISTULA PLACEMENT Left 12/19/2021   Procedure: ARTERIOVENOUS (AV) FISTULA CREATION POSSIBLE TUNNELED DIALYSIS;  Surgeon: Maeola Harman, MD;  Location: [facility] OR;  Service: Vascular;  Laterality: Left;   CERVICAL BIOPSY  W/ LOOP ELECTRODE EXCISION  2001   CORONARY/GRAFT ACUTE MI REVASCULARIZATION N/A 09/12/2022   Procedure: Coronary/Graft Acute MI Revascularization;  Surgeon: Corky Crafts, MD;  Location: [facility] INVASIVE CV LAB;  Service: Cardiovascular;  Laterality: N/A;   DILATION AND EVACUATION  08/02/2011   Procedure: DILATATION AND EVACUATION;  Surgeon:  Oliver Pila, MD;  Location: [facility] ORS;  Service: Gynecology;;   DILATION AND EVACUATION N/A 08/31/2013   Procedure: DILATATION AND EVACUATION;  Surgeon: Adam Phenix, MD;  Location: [facility] ORS;  Service: Gynecology;  Laterality: N/A;   FISTULA SUPERFICIALIZATION Left 01/20/2019   Procedure: Fistula Superficialization;  Surgeon: Chuck Hint, MD;  Location: [facility] OR;  Service: Vascular;  Laterality: Left;   HYDRADENITIS EXCISION Right    INSERTION OF DIALYSIS CATHETER Right 12/19/2021   Procedure: INSERTION OF A TUNNELED DIALYSIS CATHETER;  Surgeon: Maeola Harman, MD;  Location: [facility] OR;  Service: Vascular;  Laterality: Right;   LEFT HEART CATH AND CORONARY ANGIOGRAPHY N/A 09/12/2022   Procedure: LEFT HEART CATH AND CORONARY ANGIOGRAPHY;  Surgeon: Corky Crafts, MD;  Location: [facility] INVASIVE CV LAB;  Service: Cardiovascular;  Laterality: N/A;   RENAL BIOPSY     REVISION OF ARTERIOVENOUS GORETEX GRAFT Left 02/11/2013   Procedure: REVISION OF ARTERIOVENOUS GORTEX FISTULA;  Surgeon: Larina Earthly,  MD;  Location: [facility] OR;  Service: Vascular;  Laterality: Left;   THROMBECTOMY W/ EMBOLECTOMY Left 05/25/2018   Procedure: REPAIR OF BLEEDING ARTERIOVENOUS FISTULA;  Surgeon: Nada Libman, MD;  Location: [facility] OR;  Service: Vascular;  Laterality: Left;       Inpatient Medications: Scheduled Meds:  ALPRAZolam  1 mg Oral TID   [START ON 10/02/2022] atorvastatin  80 mg Oral Daily   [START ON 10/02/2022] Chlorhexidine Gluconate Cloth  6 each Topical Q0600   [START ON 10/02/2022] cinacalcet  60 mg Oral Q supper   [START ON 10/02/2022] clopidogrel  75 mg Oral Q breakfast   [START ON 10/02/2022] fluticasone furoate-vilanterol  1 puff Inhalation Daily   [START ON 10/02/2022] pantoprazole  40 mg Oral Daily   [START ON 10/02/2022] sevelamer carbonate  1,600 mg Oral TID WC   sodium chloride flush  3 mL Intravenous Q12H   Continuous Infusions:  sodium chloride     heparin 850 Units/hr (10/01/22  2013)   PRN Meds: sodium chloride, acetaminophen **OR** acetaminophen, albuterol, fentaNYL (SUBLIMAZE) injection, HYDROcodone-acetaminophen, metoCLOPramide (REGLAN) injection, midodrine, sodium chloride flush  Allergies:    Allergies  Allergen Reactions   Morphine Shortness Of Breath and Anaphylaxis   Prednisone Other (See Comments)    Other reaction(s): Other (See Comments) Muscle spasms Patient says prednisone causes her to cramp all over, muscle spasms uncontrolled   Tuna [Fish Allergy] Itching, Swelling, Rash and Other (See Comments)    Face droops also   Amlodipine Other (See Comments)    Angioedema (09/05/17 ED visit)   Iodinated Contrast Media Itching   Tape Itching    Adhesive tape   paper tape ok    Social History:   Social History   Socioeconomic History   Marital status: Media planner    Spouse name: Not on file   Number of children: Not on file   Years of education: Not on file   Highest education level: Not on file  Occupational History   Not on file  Tobacco Use   Smoking status: Never    Passive exposure: Yes   Smokeless tobacco: Never   Tobacco comments:    Husband and daughter Smokes on patio  Vaping Use   Vaping status: Never Used  Substance and Sexual Activity   Alcohol use: No   Drug use: Yes    Types: Marijuana    Comment: not recent   Sexual activity: Yes    Birth control/protection: None  Other Topics Concern   Not on file  Social History Narrative   Not on file   Social Determinants of Health   Financial Resource Strain: Not on file  Food Insecurity: No Food Insecurity (09/13/2022)   Hunger Vital Sign    Worried About Running Out of Food in the Last Year: Never true    Ran Out of Food in the Last Year: Never true  Transportation Needs: No Transportation Needs (09/13/2022)   PRAPARE - Administrator, Civil Service (Medical): No    Lack of Transportation (Non-Medical): No  Physical Activity: Not on file  Stress: Not on  file  Social Connections: Not on file  Intimate Partner Violence: Not At Risk (09/13/2022)   Humiliation, Afraid, Rape, and Kick questionnaire    Fear of Current or Ex-Partner: No    Emotionally Abused: No    Physically Abused: No    Sexually Abused: No    Family History:   Family History  Problem Relation Age of Onset  Diabetes Mother    Hyperlipidemia Mother    Hypertension Mother    Heart disease Mother    Hypertension Other    Diabetes type II Other      ROS:  Please see the history of present illness.  All other ROS reviewed and negative.      Physical Exam/Data:   Vitals:   10/01/22 1745 10/01/22 1837 10/01/22 1946 10/01/22 2024  BP: 109/66 119/70 113/68 112/63  Pulse: (!) 54 (!) 58 (!) 57 (!) 59  Resp: (!) 22 20 19    Temp:  98.8 F (37.1 C) 98.7 F (37.1 C) 98.7 F (37.1 C)  TempSrc:  Oral Oral Oral  SpO2: 100% 100% 100%   Weight:  76.4 kg    Height:  5\' 4"  (1.626 m)     No intake or output data in the 24 hours ending 10/01/22 2314    10/01/2022    6:37 PM 10/01/2022   11:34 AM 09/28/2022    9:14 AM  Last 3 Weights  Weight (lbs) 168 lb 6.9 oz 168 lb 6.9 oz 168 lb 6.4 oz  Weight (kg) 76.4 kg 76.4 kg 76.386 kg     Body mass index is 28.91 kg/m.  General: 47 y.o. African-American female resting comfortably in no acute distress. HEENT: Normocephalic and atraumatic. Sclera clear.  Neck: Supple. No JVD. Heart: RRR. II-III/VI systolic murmur.  Lungs: No increased work of breathing. Clear to ausculation bilaterally. No wheezes, rhonchi, or rales.  Abdomen: Soft, non-distended, and non-tender to palpation.  Extremities: Trace lower extremity edema bilaterally.  Skin: Warm and dry. Neuro: Alert and oriented x3. No focal deficits. Psych: Normal affect. Responds appropriately.   EKG:  The EKG was personally reviewed and demonstrates:  Sinus bradycardia, rate 57 bpm, with T wave inversions in inferior leads and leads V4-V6. No acute changes compared to prior  tracing. Telemetry:  Telemetry was personally reviewed and demonstrates:  Sinus rhythm with rates in the 50s to 60s.  Relevant CV Studies:  Cardiac Catheterization 09/13/2022:   Lat 3rd Mrg lesion is 100% stenosed.  Balloon angioplasty was performed using a BALLN EMERGE MR 2.0X12.   Post intervention, there is a 0% residual stenosis.   Dist LAD lesion is 100% stenosed.  Balloon angioplasty was performed using a BALLN EMERGE MR 2.0X12.   Post intervention, there is a 100% residual stenosis.   Mid Cx lesion is 50% stenosed.  Thrombotic.   LV end diastolic pressure is moderately elevated.   Recommend to resume Apixaban, at currently prescribed dose and frequency.   Recommend concurrent antiplatelet therapy of Clopidogrel 75mg  daily for 12 months.   No atherosclerotic coronary artery disease.  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  Angioplasty done in both areas but neither area receiving TIMI-3 flow.  Resume heparin 6 hours after femoral sheath removal.  She will need echocardiogram to look for source of embolism.  Would consider long-term oral anticoagulation given her presentation here today.  Could also use clopidogrel for antiplatelet therapy depending on whether or not she has bleeding issues.  Will watch in 2 heart.   I spoke to her daughter, Desiree [name], and conveyed the results.  Diagnostic Dominance: Right  Intervention     _______________  Echocardiogram 09/13/2022: Impressions: 1. Left ventricular ejection fraction, by estimation, is 35 to 40%. The  left ventricle has moderately decreased function. The left ventricle  demonstrates regional wall motion abnormalities (  see scoring  diagram/findings for description). Left ventricular   diastolic parameters are indeterminate.   2. Right ventricular systolic function is normal. The right ventricular  size is normal. There is severely elevated pulmonary  artery systolic  pressure.   3. A small pericardial effusion is present. The pericardial effusion is  circumferential.   4. The mitral valve is abnormal. Mild mitral valve regurgitation. No  evidence of mitral stenosis. Severe mitral annular calcification (MAC).  There is a mobile echodensity measuring 1.7 x 0.45 cm that is attached to  the atrial surface of the posterior  mitral valve annulus. Seen in prior echo images. Differential diagnosis  include mobile MAC, calcified thrombus, calcified vegetation. 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 Clinical correlation is recommended. Consider TEE  for further evaluation.   5. The tricuspid valve is abnormal. Tricuspid valve regurgitation is  moderate to severe.   6. The aortic valve was not well visualized. Aortic valve regurgitation  is not visualized. No aortic stenosis is present.   7. The inferior vena cava is dilated in size with <50% respiratory  variability, suggesting right atrial pressure of 15 mmHg.   8. Agitated saline contrast bubble study was negative, with no evidence  of any interatrial shunt.   Comparison(s): Changes from prior study are noted. LVEF worsened to 35-40%  with RWMA.  _______________  Lower Extremity Dopplers 09/13/2022: Summary:  BILATERAL:  - No evidence of deep vein thrombosis seen in the lower extremities,  bilaterally.  -No evidence of popliteal cyst, bilaterally.       Laboratory Data:  High Sensitivity Troponin:   Recent Labs  Lab 09/12/22 2330 10/01/22 1136 10/01/22 1323 10/01/22 2002  TROPONINIHS 40* 126* 123* 116*     Chemistry Recent Labs  Lab 10/01/22 1136 10/01/22 2002  NA 139 138  K 4.5 4.1  CL 95* 93*  CO2 31 26  GLUCOSE 90 109*  BUN 41* 42*  CREATININE 8.63* 9.07*  CALCIUM 8.8* 8.5*  GFRNONAA 5* 5*  ANIONGAP 13 19*    Recent Labs  Lab 10/01/22 2002  PROT 7.6  ALBUMIN 3.2*  AST 14*  ALT 16  ALKPHOS 98  BILITOT 0.5   Lipids No results for input(s): "CHOL",  "TRIG", "HDL", "LABVLDL", "LDLCALC", "CHOLHDL" in the last 168 hours.  Hematology Recent Labs  Lab 10/01/22 1136  WBC 5.8  RBC 3.78*  HGB 10.0*  HCT 32.0*  MCV 84.7  MCH 26.5  MCHC 31.3  RDW 18.6*  PLT 153   Thyroid No results for input(s): "TSH", "FREET4" in the last 168 hours.  BNPNo results for input(s): "BNP", "PROBNP" in the last 168 hours.  DDimer No results for input(s): "DDIMER" in the last 168 hours.   Radiology/Studies:  DG Chest Portable 1 View  Result Date: 10/01/2022 CLINICAL DATA:  Chest pain. EXAM: PORTABLE CHEST 1 VIEW COMPARISON:  September 12, 2022. FINDINGS: Mild cardiomegaly is noted with mild central pulmonary vascular congestion. No consolidative process is noted. Bony thorax is unremarkable. IMPRESSION: Mild cardiomegaly with mild central pulmonary vascular congestion. Electronically Signed   By: Lupita Raider M.D.   On: 10/01/2022 12:03     Assessment and Plan:   Chest Pain Elevated Troponin CAD with Recent STEMI Patient was recently admitted in 08/2022 for anterior STEMI. LHC showed no atherosclerotic CAD but 100% stenosis of distal LAD and 100% of lateral OM3 lesion as well as 50% stenosis of mid LCx that appeared to be from embolization of thrombus down  the left main. She underwent balloon angioplasty of both the distal LAD and OM3 lesion. Echo showed LVEF of 35-40% with multiple wall motion abnormalities and a mobile echodensity of mitral valve that was felt to likely be mobile MAC. She has continued to have recurrent chest pain since PCI felt to likely be due to panic attacks. However, today she presents with a nagging 10/10 chest pain that feels different than her symptoms prior to her recent MI. EKG shows no acute ischemic changes. High-sensitivity troponin mildly elevated and flat 126 >> 123 >> 116. - Currently still complaining of 6/10 chest pain but looks comfortably. - Will repeat limited Echo to reassess wall motion and re-evaluate the mobile density  on her mitral valve. - Continue IV Heparin for now. - Antianginals limited due to soft BP and HR. Continue Lopressor 12.5mg  twice daily and Imdur 15mg  on non-dialysis days. - Continue Plavix 75mg  once daily. Has not been on Aspirin due to need for Eliquis. - Continue Lipitor 80mg  daily. - Patient missed her dialysis session today because she overslept and reports she actually had to be given back IV fluids at her session on Tuesday due to low BP. She reports mild swelling around her ankles which is unusual for her as well as a headache which she states she usually on has been she is volume overloaded. So I wonder if chest pain is because she is volume up. I have low suspicion that this is true ACS. Recommend consulting Nephrology for possible dialysis tomorrow.  Chronic HFrEF Ischemic Evaluation Echo during recent admission showed LVEF of 35-40% with multiple wall motion abnormalities. Chest x-ray showed mild cardiomegaly with mild central pulmonary vascular congestion. - Trace edema on exam. However, suspect she may be volume up due to missed dialysis sessions as above. - Volume status managed via dialysis.  - GDMT limited due to soft BP with patient requiring Midodrine on dialysis days. - Continue Imdur and Lopressor as above. - Repeat limited Echo as above.  Paroxysmal Atrial Fibrillation Diagnosed with new onset atrial fibrillation during recent admission. - Maintaining sinus rhythm.  - Continue Lopressor 12.5mg  twice daily. - On anticoagulation with Eliquis at home but currently on IV Heparin. OK to continue Heparin for now.  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 However, differential also vegetation given she is on HD and cath concerning for coronary embolism - Will  repeat limited Echo and check blood cultures.  ESRD On dialysis T/Th/Sat. However, had to be given IV fluids during session on 7/16 and missed session today. - Management per Nephrology.  Otherwise, per primary team: - Chronic respiratory failure on 3L of home O2 - GERD - Anxiety - Seizure disorder - Migraines   For questions or updates, please contact [facility] Please consult [email or web address] for contact info under    Signed, Little Ishikawa, MD  10/01/2022 11:14 PM  Patient seen and examined.  Agree with recommendation.  Desiree [name] is a 47 year old female with a history of CAD status post STEMI 09/12/2022, ESRD, chronic respiratory failure, hypertension, seizures who we are consulted by Desiree [name] for evaluation of chest pain.  She was admitted from 6/29 through 09/17/2022 with anterior STEMI after presenting with chest pain.  Cath showed no atherosclerotic coronary artery disease but was noted  to likely have embolization of thrombus down left main and causing 100% stenosis of distal LAD and 100% stenosis of the OM3 as well as 50% stenosis of mid LCx.  Both the distal LAD and OM 3 lesions were ballooned.  Echocardiogram showed EF 35 to 40%.  She was also noted to have mobile echodensity on mitral valve suspected to be mobile MAC.  During hospitalization, she was diagnosed with atrial fibrillation.  She was discharged on Plavix and Eliquis.  She presented today to ED with chest pain.  Initial vital signs in the ED notable for BP 126/72, pulse 56, SpO2 100% on room air.  Labs notable for creatinine 8.63, hemoglobin 10, troponin 126 > 123 > 116.  EKG shows sinus bradycardia, rate 57, T wave inversion in inferior leads and V4-6 (similar to prior EKG).  Chest x-ray showed mild cardiomegaly with mild pulmonary vascular congestion.  She reports she missed dialysis today and on Tuesday they had to give her fluids during HD.  On exam, patient is alert and oriented, regular rate and rhythm, 2  out of 6 systolic murmur, lungs CTAB, no LE edema.  For her chest pain, low suspicion for ACS as troponins are mildly elevated but downtrending.  Her recent cath showed no atherosclerotic CAD but felt to likely have coronary embolism.  Notably her echo showed mobile mitral valve mass concerning for mobile MAC.  Would also consider endocarditis on differential as she is on dialysis.  Will check blood cultures and repeat limited echo.  She missed her HD today and reports that she had to be given fluids during HD on 7/16.  Suspect volume overload contributing to presentation and will need HD.  Little Ishikawa, MD

## 2022-10-02 ENCOUNTER — Observation Stay (HOSPITAL_BASED_OUTPATIENT_CLINIC_OR_DEPARTMENT_OTHER): Payer: 59

## 2022-10-02 ENCOUNTER — Observation Stay (HOSPITAL_COMMUNITY): Payer: 59

## 2022-10-02 DIAGNOSIS — R079 Chest pain, unspecified: Secondary | ICD-10-CM | POA: Diagnosis not present

## 2022-10-02 DIAGNOSIS — N186 End stage renal disease: Secondary | ICD-10-CM | POA: Diagnosis not present

## 2022-10-02 DIAGNOSIS — R072 Precordial pain: Secondary | ICD-10-CM | POA: Diagnosis not present

## 2022-10-02 DIAGNOSIS — Z992 Dependence on renal dialysis: Secondary | ICD-10-CM | POA: Diagnosis not present

## 2022-10-02 DIAGNOSIS — I5023 Acute on chronic systolic (congestive) heart failure: Secondary | ICD-10-CM | POA: Diagnosis not present

## 2022-10-02 LAB — CBC
HCT: 30.4 % — ABNORMAL LOW (ref 36.0–46.0)
Hemoglobin: 9.5 g/dL — ABNORMAL LOW (ref 12.0–15.0)
MCH: 26.2 pg (ref 26.0–34.0)
MCHC: 31.3 g/dL (ref 30.0–36.0)
MCV: 84 fL (ref 80.0–100.0)
Platelets: 129 10*3/uL — ABNORMAL LOW (ref 150–400)
RBC: 3.62 MIL/uL — ABNORMAL LOW (ref 3.87–5.11)
RDW: 18.5 % — ABNORMAL HIGH (ref 11.5–15.5)
WBC: 4.9 10*3/uL (ref 4.0–10.5)
nRBC: 0 % (ref 0.0–0.2)

## 2022-10-02 LAB — COMPREHENSIVE METABOLIC PANEL
ALT: 14 U/L (ref 0–44)
AST: 13 U/L — ABNORMAL LOW (ref 15–41)
Albumin: 2.9 g/dL — ABNORMAL LOW (ref 3.5–5.0)
Alkaline Phosphatase: 91 U/L (ref 38–126)
Anion gap: 14 (ref 5–15)
BUN: 49 mg/dL — ABNORMAL HIGH (ref 6–20)
CO2: 26 mmol/L (ref 22–32)
Calcium: 7.9 mg/dL — ABNORMAL LOW (ref 8.9–10.3)
Chloride: 94 mmol/L — ABNORMAL LOW (ref 98–111)
Creatinine, Ser: 9.79 mg/dL — ABNORMAL HIGH (ref 0.44–1.00)
GFR, Estimated: 4 mL/min — ABNORMAL LOW (ref >60)
Glucose, Bld: 81 mg/dL (ref 70–99)
Potassium: 4.6 mmol/L (ref 3.5–5.1)
Sodium: 134 mmol/L — ABNORMAL LOW (ref 135–145)
Total Bilirubin: 0.7 mg/dL (ref 0.3–1.2)
Total Protein: 6.9 g/dL (ref 6.5–8.1)

## 2022-10-02 LAB — ECHOCARDIOGRAM LIMITED
Calc EF: 64.6 %
Height: 64 in
S' Lateral: 3.4 cm
Single Plane A2C EF: 60.7 %
Single Plane A4C EF: 64.5 %
Weight: 3093.49 oz

## 2022-10-02 LAB — MAGNESIUM: Magnesium: 2.3 mg/dL (ref 1.7–2.4)

## 2022-10-02 LAB — PHOSPHORUS: Phosphorus: 5.1 mg/dL — ABNORMAL HIGH (ref 2.5–4.6)

## 2022-10-02 LAB — IRON AND TIBC
Iron: 67 ug/dL (ref 28–170)
Saturation Ratios: 31 % (ref 10.4–31.8)
TIBC: 217 ug/dL — ABNORMAL LOW (ref 250–450)
UIBC: 150 ug/dL

## 2022-10-02 LAB — RETICULOCYTES
Immature Retic Fract: 4.9 % (ref 2.3–15.9)
RBC.: 3.68 MIL/uL — ABNORMAL LOW (ref 3.87–5.11)
Retic Count, Absolute: 15.7 10*3/uL — ABNORMAL LOW (ref 19.0–186.0)
Retic Ct Pct: 0.4 % (ref 0.4–3.1)

## 2022-10-02 LAB — FERRITIN: Ferritin: 174 ng/mL (ref 11–307)

## 2022-10-02 LAB — APTT: aPTT: 44 seconds — ABNORMAL HIGH (ref 24–36)

## 2022-10-02 LAB — CULTURE, BLOOD (ROUTINE X 2): Special Requests: ADEQUATE

## 2022-10-02 MED ORDER — APIXABAN 5 MG PO TABS
5.0000 mg | ORAL_TABLET | Freq: Two times a day (BID) | ORAL | Status: DC
  Administered 2022-10-02 – 2022-10-03 (×3): 5 mg via ORAL
  Filled 2022-10-02 (×3): qty 1

## 2022-10-02 MED ORDER — OXYCODONE HCL 5 MG PO TABS
10.0000 mg | ORAL_TABLET | Freq: Three times a day (TID) | ORAL | Status: DC | PRN
  Administered 2022-10-02 – 2022-10-03 (×2): 10 mg via ORAL
  Filled 2022-10-02 (×2): qty 2

## 2022-10-02 MED ORDER — APIXABAN 5 MG PO TABS
ORAL_TABLET | ORAL | Status: AC
  Filled 2022-10-02: qty 1

## 2022-10-02 MED ORDER — CHLORHEXIDINE GLUCONATE CLOTH 2 % EX PADS
6.0000 | MEDICATED_PAD | Freq: Every day | CUTANEOUS | Status: DC
  Administered 2022-10-02: 6 via TOPICAL

## 2022-10-02 NOTE — Progress Notes (Signed)
*  PRELIMINARY RESULTS* Echocardiogram 2D Echocardiogram has been performed.  Desiree [name] 10/02/2022, 3:58 PM

## 2022-10-02 NOTE — Assessment & Plan Note (Signed)
Continue Xanax 1 mg 3 times daily

## 2022-10-02 NOTE — Hospital Course (Signed)
47yo female with h/o CAD s/p anterior STEMI on 6/29 requiring balloon angioplasty, ESRD on TTS HD (missed HD on day of admission because she overslept), HTN, seizure d/o, anxiety, chronic systolic CHF, and afib on Eliquis who presented with chest pain.  Cardiology consulted and recommended repeat limited echo to reassess wall motion and re-evaluate the mobile density on her mitral valve.  She is on heparin infusion, Plavix, Lipitor.

## 2022-10-02 NOTE — Progress Notes (Signed)
   10/02/22 1209  Vitals  Temp 98.4 F (36.9 C)  Pulse Rate (!) 58  Resp 11  BP 112/65  SpO2 97 %  O2 Device Nasal Cannula  Oxygen Therapy  O2 Flow Rate (L/min) 3 L/min  Patient Activity (if Appropriate) In bed  Pulse Oximetry Type Continuous  Post Treatment  Dialyzer Clearance Clear  Duration of HD Treatment -hour(s) 3.75 hour(s)  Hemodialysis Intake (mL) 0 mL  Liters Processed 90  Fluid Removed (mL) 3500 mL  Tolerated HD Treatment Yes  AVG/AVF Arterial Site Held (minutes) 8 minutes  AVG/AVF Venous Site Held (minutes) 8 minutes   Received patient in bed to unit.  Alert and oriented.  Informed consent signed and in chart.   TX duration:3.75hrs  Patient tolerated well.  Transported back to the room  Alert, without acute distress.  Hand-off given to patient's nurse.   Access used: LUA AVF Access issues: none  Total UF removed: 3.5L Medication(s) given: fentanyl 25mg     [facility]

## 2022-10-02 NOTE — Progress Notes (Signed)
Date and time results received: 10/01/2022 2345 (use smartphrase ".now" to insert current time)  Test: Troponin Critical Value: 130  Name of Provider Notified: A. Adela Glimpse, MD  Orders Received? Or Actions Taken?:  No order.

## 2022-10-02 NOTE — Progress Notes (Signed)
Rounding Note    Patient Name: Desiree [name] Date of Encounter: 10/02/2022  [facility] Cardiologist: Jodelle Red, MD   Subjective   Complains of mild chest pain. Tired   Inpatient Medications    Scheduled Meds:  ALPRAZolam  1 mg Oral TID   atorvastatin  80 mg Oral Daily   Chlorhexidine Gluconate Cloth  6 each Topical Q0600   Chlorhexidine Gluconate Cloth  6 each Topical Q0600   cinacalcet  60 mg Oral Q supper   clopidogrel  75 mg Oral Q breakfast   fluticasone furoate-vilanterol  1 puff Inhalation Daily   pantoprazole  40 mg Oral Daily   sevelamer carbonate  1,600 mg Oral TID WC   sodium chloride flush  3 mL Intravenous Q12H   Continuous Infusions:  sodium chloride     heparin 850 Units/hr (10/02/22 0400)   PRN Meds: sodium chloride, acetaminophen **OR** acetaminophen, albuterol, fentaNYL (SUBLIMAZE) injection, metoCLOPramide (REGLAN) injection, midodrine, sodium chloride flush   Vital Signs    Vitals:   10/02/22 0809 10/02/22 0830 10/02/22 0900 10/02/22 0930  BP: 119/64 122/75 112/66 110/68  Pulse: (!) 57 (!) 58 (!) 59 63  Resp: 13 14 12 14   Temp:      TempSrc:      SpO2: 100% 100% 100% 100%  Weight:      Height:        Intake/Output Summary (Last 24 hours) at 10/02/2022 1028 Last data filed at 10/02/2022 0400 Gross per 24 hour  Intake 785.96 ml  Output --  Net 785.96 ml      10/02/2022    7:52 AM 10/01/2022    6:37 PM 10/01/2022   11:34 AM  Last 3 Weights  Weight (lbs) 193 lb 5.5 oz 168 lb 6.9 oz 168 lb 6.9 oz  Weight (kg) 87.7 kg 76.4 kg 76.4 kg      Telemetry    NSR, sinus brady- Personally Reviewed  ECG    None today - Personally Reviewed  Physical Exam   GEN: No acute distress.  Seen on dialysis Neck: No JVD Cardiac: RRR, gr 2/6 systolic murmur at apex, no rub Respiratory: Clear to auscultation bilaterally. GI: Soft, nontender, non-distended  MS: No edema; No deformity. Neuro:  Nonfocal  Psych: Normal affect    Labs    High Sensitivity Troponin:   Recent Labs  Lab 09/12/22 2330 10/01/22 1136 10/01/22 1323 10/01/22 2002 10/01/22 2145  TROPONINIHS 40* 126* 123* 116* 130*     Chemistry Recent Labs  Lab 10/01/22 1136 10/01/22 2002  NA 139 138  K 4.5 4.1  CL 95* 93*  CO2 31 26  GLUCOSE 90 109*  BUN 41* 42*  CREATININE 8.63* 9.07*  CALCIUM 8.8* 8.5*  PROT  --  7.6  ALBUMIN  --  3.2*  AST  --  14*  ALT  --  16  ALKPHOS  --  98  BILITOT  --  0.5  GFRNONAA 5* 5*  ANIONGAP 13 19*    Lipids No results for input(s): "CHOL", "TRIG", "HDL", "LABVLDL", "LDLCALC", "CHOLHDL" in the last 168 hours.  Hematology Recent Labs  Lab 10/01/22 1136 10/02/22 0700  WBC 5.8 4.9  RBC 3.78* 3.62*  3.68*  HGB 10.0* 9.5*  HCT 32.0* 30.4*  MCV 84.7 84.0  MCH 26.5 26.2  MCHC 31.3 31.3  RDW 18.6* 18.5*  PLT 153 129*   Thyroid No results for input(s): "TSH", "FREET4" in the last 168 hours.  BNPNo results for input(s): "  BNP", "PROBNP" in the last 168 hours.  DDimer No results for input(s): "DDIMER" in the last 168 hours.   Radiology    DG Chest Portable 1 View  Result Date: 10/01/2022 CLINICAL DATA:  Chest pain. EXAM: PORTABLE CHEST 1 VIEW COMPARISON:  September 12, 2022. FINDINGS: Mild cardiomegaly is noted with mild central pulmonary vascular congestion. No consolidative process is noted. Bony thorax is unremarkable. IMPRESSION: Mild cardiomegaly with mild central pulmonary vascular congestion. Electronically Signed   By: Lupita Raider M.D.   On: 10/01/2022 12:03    Cardiac Studies   Cardiac Catheterization 09/13/2022:   Lat 3rd Mrg lesion is 100% stenosed.  Balloon angioplasty was performed using a BALLN EMERGE MR 2.0X12.   Post intervention, there is a 0% residual stenosis.   Dist LAD lesion is 100% stenosed.  Balloon angioplasty was performed using a BALLN EMERGE MR 2.0X12.   Post intervention, there is a 100% residual stenosis.   Mid Cx lesion is 50% stenosed.  Thrombotic.   LV end  diastolic pressure is moderately elevated.   Recommend to resume Apixaban, at currently prescribed dose and frequency.   Recommend concurrent antiplatelet therapy of Clopidogrel 75mg  daily for 12 months.   No atherosclerotic coronary artery disease.  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  Angioplasty done in both areas but neither area receiving TIMI-3 flow.  Resume heparin 6 hours after femoral sheath removal.  She will need echocardiogram to look for source of embolism.  Would consider long-term oral anticoagulation given her presentation here today.  Could also use clopidogrel for antiplatelet therapy depending on whether or not she has bleeding issues.  Will watch in 2 heart.   I spoke to her daughter, Mel Almond, and conveyed the results.   Diagnostic Dominance: Right  Intervention      _______________   Echocardiogram 09/13/2022: Impressions: 1. Left ventricular ejection fraction, by estimation, is 35 to 40%. The  left ventricle has moderately decreased function. The left ventricle  demonstrates regional wall motion abnormalities (see scoring  diagram/findings for description). Left ventricular   diastolic parameters are indeterminate.   2. Right ventricular systolic function is normal. The right ventricular  size is normal. There is severely elevated pulmonary artery systolic  pressure.   3. A small pericardial effusion is present. The pericardial effusion is  circumferential.   4. The mitral valve is abnormal. Mild mitral valve regurgitation. No  evidence of mitral stenosis. Severe mitral annular calcification (MAC).  There is a mobile echodensity measuring 1.7 x 0.45 cm that is attached to  the atrial surface of the posterior  mitral valve annulus. Seen in prior echo images. Differential diagnosis  include mobile MAC, calcified thrombus, calcified vegetation. 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  Clinical correlation is recommended. Consider TEE  for further evaluation.   5. The tricuspid valve is abnormal. Tricuspid valve regurgitation is  moderate to severe.   6. The aortic valve was not well visualized. Aortic valve regurgitation  is not visualized. No aortic stenosis is present.   7. The inferior vena cava is dilated in size with <50% respiratory  variability, suggesting right atrial pressure of 15 mmHg.   8. Agitated saline contrast bubble study was negative, with no evidence  of any interatrial shunt.   Comparison(s): Changes from prior study are noted. LVEF worsened to 35-40%  with RWMA.  _______________   Lower Extremity Dopplers 09/13/2022:  Summary:  BILATERAL:  - No evidence of deep vein thrombosis seen in the lower extremities,  bilaterally.  -No evidence of popliteal cyst, bilaterally.        Patient Profile     47 y.o. female with a history of CAD with anterior STEMI on 09/12/2022 s/p balloon angioplasty of distal LAD and lateral OM3, childhood asthma, chronic respiratory failure on 3L of home O2, hypertension, ESRD on hemodialysis T/Th/Sat, GERD, anemia, migraines, seizure disorder, and anxiety who is being seen 10/01/2022 for the evaluation of chest pain at the request of Dr. Rosalia Hammers.    Assessment & Plan    Atypical chest pain. Mild troponin leak with flat trend not really c/w ACS. Prior embolic infarct involving distal LAD and OM. There is no role for repeat invasive evaluation in this setting. Will DC IV heparin and resume Eliquis. Continue plavix. Continue antianginal therapy as prior low dose Imdur and metoprolol on nondialysis days.  Chronic systolic CHF. EF 35-40%. Repeat Echo ordered. Volume maintained with dialysis.  PAFib. Continue Eliquis MV mass. Chronic - noted initially in October 2023. Very unlikely to be active endocarditis. 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  ESRD on dialysis.    [facility] will sign off.   Medication Recommendations:  see  above Other recommendations (labs, testing, etc):  none Follow up as an outpatient:  as scheduled with Dr Cristal Deer in Sept  For questions or updates, please contact [facility] Please consult [email or web address] for contact info under        Signed, Jayleena Stille [place], MD  10/02/2022, 10:28 AM

## 2022-10-02 NOTE — Plan of Care (Signed)

## 2022-10-02 NOTE — Assessment & Plan Note (Addendum)
Patient usually takes oxycodone 10 mg p.o. 3 times daily scheduled Currently soft blood pressures and bradycardic will hold off Patient appears to be somewhat calm and sedated

## 2022-10-02 NOTE — Procedures (Signed)
Patient was seen on dialysis and the procedure was supervised.  BFR 400  Via AVF BP is  110/68.   Patient appears to be tolerating treatment well.  Desiree [name] 10/02/2022

## 2022-10-02 NOTE — Consult Note (Signed)
[facility] Renal Consultation Note    Indication for Consultation:  Management of ESRD/hemodialysis, anemia, hypertension/volume, and secondary hyperparathyroidism.  HPI: Desiree [name] is a 47 y.o. female with PMH including ESRD on dialysis, HTN, migraines, CHF with severe mitral annular calcification, a.fib and anxiety who presented to the ED with chest pain.  She recently had a STEMI in 08/2022 which was treated with angioplasty. Patient reports she woke up with "nagging" chest pain yesterday, less severe than previous MI. She missed HD on Thursday because she overslept. Reports that her BP dropped significantly on HD on Tuesday and they had to give her IV saline, but she did not have any chest pain at that time. She presented to [facility] ED and was transferred to [facility] for cardiac eval. Troponin was elevated initially but downtrending, CXR with mild cardiomegaly and mild central pulmonary vascular congestion. She was evaluated by cardiology yesterday, started on IV heparin and planned for repeat echo to reassess mobile density on mitral valve. Nephrology has been consulted for HD.   Patient seen in HD this AM. She reports she is still having moderate chest pain that does not radiate. She is on O2 chronically at home and denies any worsening in SOB, no recent cough. She is feeling very anxious and reports she is having frequent panic attacks, often triggered by dialysis. She reports she has an appointment scheduled to adjust her anxiety medications. She denies HA, blurry vision, dizziness, abdominal pain, N/V/D, peripheral edema, fever, chills, and fatigue. Reports her legs feel weaker over the past 24 hours but she is still able to move them, has not tried to walk. No joint pain or swelling. She feels that this pain is different than the chest pain she had in 08/2022.   Past Medical History:  Diagnosis Date   Anemia    Anxiety    panic attacks   Arthritis    bilateral knees    Childhood asthma    Complication of anesthesia    "sometimes it does not work; didn't during LEEP OR" (01/21/2016)   Depression    no med   ESRD (end stage renal disease) on dialysis (HCC)    "TTS; [facility]; [facility]" (08/03/2016)   GERD (gastroesophageal reflux disease)    nexium prn   Gout    History of blood transfusion    "related to kidneys; I've had 4" (01/21/2016)   Hypertension    Migraine    last one 01/18/19   Preterm labor ~ 2014   Retina hole, left    Seizures (HCC)    "last one was in 2000; related to preeclampsia" (01/21/2016)   Past Surgical History:  Procedure Laterality Date   A/V FISTULAGRAM Left 12/19/2021   Procedure: A/V Fistulagram;  Surgeon: Chuck Hint, MD;  Location: [facility] INVASIVE CV LAB;  Service: Cardiovascular;  Laterality: Left;   AV FISTULA PLACEMENT Left 2010   AV FISTULA PLACEMENT Left 01/20/2019   Procedure: ARTERIOVENOUS (AV) FISTULA CREATION LEFT UPPER ARM;  Surgeon: Chuck Hint, MD;  Location: [facility] OR;  Service: Vascular;  Laterality: Left;   AV FISTULA PLACEMENT Left 12/19/2021   Procedure: ARTERIOVENOUS (AV) FISTULA CREATION POSSIBLE TUNNELED DIALYSIS;  Surgeon: Maeola Harman, MD;  Location: [facility] OR;  Service: Vascular;  Laterality: Left;   CERVICAL BIOPSY  W/ LOOP ELECTRODE EXCISION  2001   CORONARY/GRAFT ACUTE MI REVASCULARIZATION N/A 09/12/2022   Procedure: Coronary/Graft Acute MI Revascularization;  Surgeon: Corky Crafts, MD;  Location: [facility] INVASIVE CV  LAB;  Service: Cardiovascular;  Laterality: N/A;   DILATION AND EVACUATION  08/02/2011   Procedure: DILATATION AND EVACUATION;  Surgeon: Oliver Pila, MD;  Location: [facility] ORS;  Service: Gynecology;;   DILATION AND EVACUATION N/A 08/31/2013   Procedure: DILATATION AND EVACUATION;  Surgeon: Adam Phenix, MD;  Location: [facility] ORS;  Service: Gynecology;  Laterality: N/A;   FISTULA SUPERFICIALIZATION Left 01/20/2019   Procedure: Fistula Superficialization;   Surgeon: Chuck Hint, MD;  Location: [facility] OR;  Service: Vascular;  Laterality: Left;   HYDRADENITIS EXCISION Right    INSERTION OF DIALYSIS CATHETER Right 12/19/2021   Procedure: INSERTION OF A TUNNELED DIALYSIS CATHETER;  Surgeon: Maeola Harman, MD;  Location: [facility] OR;  Service: Vascular;  Laterality: Right;   LEFT HEART CATH AND CORONARY ANGIOGRAPHY N/A 09/12/2022   Procedure: LEFT HEART CATH AND CORONARY ANGIOGRAPHY;  Surgeon: Corky Crafts, MD;  Location: [facility] INVASIVE CV LAB;  Service: Cardiovascular;  Laterality: N/A;   RENAL BIOPSY     REVISION OF ARTERIOVENOUS GORETEX GRAFT Left 02/11/2013   Procedure: REVISION OF ARTERIOVENOUS GORTEX FISTULA;  Surgeon: Larina Earthly, MD;  Location: [facility] OR;  Service: Vascular;  Laterality: Left;   THROMBECTOMY W/ EMBOLECTOMY Left 05/25/2018   Procedure: REPAIR OF BLEEDING ARTERIOVENOUS FISTULA;  Surgeon: Nada Libman, MD;  Location: [facility] OR;  Service: Vascular;  Laterality: Left;   Family History  Problem Relation Age of Onset   Diabetes Mother    Hyperlipidemia Mother    Hypertension Mother    Heart disease Mother    Hypertension Other    Diabetes type II Other    Social History:  reports that she has never smoked. She has been exposed to tobacco smoke. She has never used smokeless tobacco. She reports current drug use. Drug: Marijuana. She reports that she does not drink alcohol.  ROS: As per HPI otherwise negative.  Physical Exam: Vitals:   10/02/22 0733 10/02/22 0752 10/02/22 0809 10/02/22 0830  BP: 105/61 113/68 119/64 122/75  Pulse: (!) 59 (!) 58 (!) 57 (!) 58  Resp: 15 16 13 14   Temp: 98.7 F (37.1 C) 98.3 F (36.8 C)    TempSrc: Oral     SpO2: 100% 100% 100% 100%  Weight:  87.7 kg    Height:         General: Well developed, well nourished, in no acute distress. Head: Normocephalic, atraumatic, sclera non-icteric, mucus membranes are moist. Lungs: Clear bilaterally to auscultation without wheezes, rales,  or rhonchi. Breathing is unlabored on O2 3L Heart: RRR with normal S1, S2. No murmurs, rubs, or gallops appreciated. Abdomen: Soft, non-tender, non-distended with normoactive bowel sounds. No rebound/guarding. No obvious abdominal masses. Musculoskeletal:  Strength and tone appear normal for age. Lower extremities: No edema or ischemic changes, no open wounds. Neuro: Alert and oriented X 3. Moves all extremities spontaneously. Psych:  Responds to questions appropriately with a normal affect. Dialysis Access: LUE AVF cannulated  Allergies  Allergen Reactions   Morphine Shortness Of Breath and Anaphylaxis   Prednisone Other (See Comments)    Other reaction(s): Other (See Comments) Muscle spasms Patient says prednisone causes her to cramp all over, muscle spasms uncontrolled   Tuna [Fish Allergy] Itching, Swelling, Rash and Other (See Comments)    Face droops also   Amlodipine Other (See Comments)    Angioedema (09/05/17 ED visit)   Iodinated Contrast Media Itching   Tape Itching    Adhesive tape   paper tape ok  Prior to Admission medications   Medication Sig Start Date End Date Taking? Authorizing Provider  acetaminophen (TYLENOL) 325 MG tablet Take 325 mg by mouth every 6 (six) hours as needed.   Yes [provider]  albuterol (PROVENTIL HFA;VENTOLIN HFA) 108 (90 Base) MCG/ACT inhaler Inhale 2 puffs into the lungs every 4 (four) hours as needed for wheezing or shortness of breath. 05/02/16  Yes Lavera Guise, MD  ALPRAZolam Prudy Feeler) 1 MG tablet Take 1 mg by mouth 3 (three) times daily. 11/25/21  Yes [provider]  apixaban (ELIQUIS) 5 MG TABS tablet Take 1 tablet (5 mg total) by mouth 2 (two) times daily. 09/28/22  Yes Alver Sorrow, NP  atorvastatin (LIPITOR) 80 MG tablet Take 1 tablet (80 mg total) by mouth daily. 09/28/22  Yes Alver Sorrow, NP  cinacalcet (SENSIPAR) 30 MG tablet Take 2 tablets (60 mg total) by mouth daily with supper. 09/17/22  Yes Corky Crafts, MD  clopidogrel (PLAVIX) 75 MG tablet Take 1 tablet (75 mg total) by mouth daily with breakfast. 09/17/22  Yes Jonita Albee, PA-C  cyclobenzaprine (FLEXERIL) 10 MG tablet Take 10 mg by mouth 2 (two) times daily as needed for muscle spasms.   Yes [provider]  ferric citrate (AURYXIA) 1 GM 210 MG(Fe) tablet Take 1 tablet (210 mg total) by mouth 3 (three) times daily with meals. 09/17/22  Yes Corky Crafts, MD  fluticasone furoate-vilanterol (BREO ELLIPTA) 100-25 MCG/ACT AEPB Inhale 1 puff into the lungs daily. 04/30/22  Yes Oretha Milch, MD  folic acid-vitamin b complex-vitamin c-selenium-zinc (DIALYVITE) 3 MG TABS tablet Take 1 tablet by mouth daily.   Yes [provider]  isosorbide mononitrate (IMDUR) 30 MG 24 hr tablet Take a half tablet in the morning on non-dialysis days. 09/28/22  Yes Alver Sorrow, NP  lactulose (CHRONULAC) 10 GM/15ML solution Take 30 mLs by mouth daily as needed for mild constipation, moderate constipation or severe constipation. 09/04/21  Yes [provider]  loperamide (IMODIUM) 2 MG capsule Take 2 mg by mouth as needed for diarrhea or loose stools.   Yes [provider]  lurasidone (LATUDA) 20 MG TABS tablet Take 20 mg by mouth daily. 09/09/22  Yes [provider]  metoprolol tartrate (LOPRESSOR) 25 MG tablet Take 0.5 tablets (12.5 mg total) by mouth 2 (two) times daily. 09/28/22  Yes Alver Sorrow, NP  midodrine (PROAMATINE) 10 MG tablet Take 10 mg by mouth 3 (three) times daily as needed (Hypotension). 09/03/22  Yes [provider]  mupirocin ointment (BACTROBAN) 2 % Apply 1 Application topically 2 (two) times daily. 06/09/22  Yes [provider]  ondansetron (ZOFRAN-ODT) 8 MG disintegrating tablet Take 8 mg by mouth 2 (two) times daily.   Yes [provider]  Oxycodone HCl 10 MG TABS Take 1 tablet (10 mg total) by mouth 3 (three) times daily as needed (severe  pain). Patient taking differently: Take 10 mg by mouth 3 (three) times daily. 12/22/21  Yes Arrien, York Ram, MD  pantoprazole (PROTONIX) 40 MG tablet Take 1 tablet (40 mg total) by mouth daily. 09/28/22 03/27/23 Yes Alver Sorrow, NP  sevelamer carbonate (RENVELA) 800 MG tablet Take 1,600 mg by mouth 3 (three) times daily. 05/18/19  Yes [provider]  NARCAN 4 MG/0.1ML LIQD nasal spray kit Place 1 spray into the nose as needed (accidental overdose). 11/14/18   [provider]   Current Facility-Administered Medications  Medication Dose Route  Frequency Provider Last Rate Last Admin   0.9 %  sodium chloride infusion  250 mL Intravenous PRN Therisa Doyne, MD       acetaminophen (TYLENOL) tablet 650 mg  650 mg Oral Q6H PRN Therisa Doyne, MD       Or   acetaminophen (TYLENOL) suppository 650 mg  650 mg Rectal Q6H PRN Doutova, Anastassia, MD       albuterol (PROVENTIL) (2.5 MG/3ML) 0.083% nebulizer solution 2.5 mg  2.5 mg Inhalation Q4H PRN Doutova, Anastassia, MD       ALPRAZolam Prudy Feeler) tablet 1 mg  1 mg Oral TID Therisa Doyne, MD   1 mg at 10/02/22 0734   atorvastatin (LIPITOR) tablet 80 mg  80 mg Oral Daily Doutova, Anastassia, MD       Chlorhexidine Gluconate Cloth 2 % PADS 6 each  6 each Topical Q0600 Therisa Doyne, MD   6 each at 10/02/22 0615   cinacalcet (SENSIPAR) tablet 60 mg  60 mg Oral Q supper Doutova, Jonny Ruiz, MD       clopidogrel (PLAVIX) tablet 75 mg  75 mg Oral Q breakfast Doutova, Anastassia, MD   75 mg at 10/01/22 2207   fentaNYL (SUBLIMAZE) injection 25 mcg  25 mcg Intravenous Q2H PRN Therisa Doyne, MD   25 mcg at 10/02/22 0329   fluticasone furoate-vilanterol (BREO ELLIPTA) 100-25 MCG/ACT 1 puff  1 puff Inhalation Daily Doutova, Anastassia, MD       heparin ADULT infusion 100 units/mL (25000 units/266mL)  850 Units/hr Intravenous Continuous Doutova, Anastassia, MD 8.5 mL/hr at 10/02/22 0400 850 Units/hr at 10/02/22 0400    metoCLOPramide (REGLAN) injection 5 mg  5 mg Intravenous Q6H PRN Doutova, Anastassia, MD       midodrine (PROAMATINE) tablet 10 mg  10 mg Oral TID PRN Therisa Doyne, MD   10 mg at 10/02/22 0655   pantoprazole (PROTONIX) EC tablet 40 mg  40 mg Oral Daily Doutova, Anastassia, MD       sevelamer carbonate (RENVELA) tablet 1,600 mg  1,600 mg Oral TID WC Doutova, Anastassia, MD   1,600 mg at 10/01/22 2209   sodium chloride flush (NS) 0.9 % injection 3 mL  3 mL Intravenous Q12H Doutova, Anastassia, MD       sodium chloride flush (NS) 0.9 % injection 3 mL  3 mL Intravenous PRN Therisa Doyne, MD       Labs: Basic Metabolic Panel: Recent Labs  Lab 10/01/22 1136 10/01/22 2002  NA 139 138  K 4.5 4.1  CL 95* 93*  CO2 31 26  GLUCOSE 90 109*  BUN 41* 42*  CREATININE 8.63* 9.07*  CALCIUM 8.8* 8.5*   Liver Function Tests: Recent Labs  Lab 10/01/22 2002  AST 14*  ALT 16  ALKPHOS 98  BILITOT 0.5  PROT 7.6  ALBUMIN 3.2*    CBC: Recent Labs  Lab 10/01/22 1136 10/02/22 0700  WBC 5.8 4.9  HGB 10.0* 9.5*  HCT 32.0* 30.4*  MCV 84.7 84.0  PLT 153 129*    Studies/Results: DG Chest Portable 1 View  Result Date: 10/01/2022 CLINICAL DATA:  Chest pain. EXAM: PORTABLE CHEST 1 VIEW COMPARISON:  September 12, 2022. FINDINGS: Mild cardiomegaly is noted with mild central pulmonary vascular congestion. No consolidative process is noted. Bony thorax is unremarkable. IMPRESSION: Mild cardiomegaly with mild central pulmonary vascular congestion. Electronically Signed   By: Lupita Raider M.D.   On: 10/01/2022 12:03    Dialysis Orders:  Center: [facility]  on TTS .  180NRe 3 hr 45 min BFR 450 DFR 500 EDW 83.5kg 2K 2Ca AVF 15g Heparin 4000 unit bolus Mircera IV q 2 weeks- last dose 09/22/22 Hectorol q HD Sensipar 30mg  PO q HD  Assessment/Plan:  Chest pain: Recent STEMI in 08/2022. Currently on a heparin drip. Plan per cardiology is to repeat echo to rule out endocarditis. Volume  overload possibly contributing.   ESRD:  Attends HD on TTS schedule, off schedule today d/t missed HD yesterday. It is possible her weakness is due to mild uremia, will see if it improves post HD today. Deconditioning a possibility with recent cardiac event. Next HD Saturday.   Hypertension/volume: Mild pulmonary congestion on CXR, she is 4.2kg over her EDW. UFG 3L as tolerated today, further UF with HD tomorrow. Continue midodrine for BP support.   Anemia: Hgb 9-10's, on max ESA, not due for next dose yet.   Metabolic bone disease: Cakcium controlled. Continue hectorol, resume sensipar once tolerating PO well  Nutrition:  Continue renal diet/protein supplements Anxiety: Reports frequent panic attacks, worse since her MI last month. On xanax.   Rogers Blocker, PA-C 10/02/2022, 8:41 AM  [facility] Pager: [phone]

## 2022-10-02 NOTE — Progress Notes (Signed)
Progress Note   Patient: Desiree [name] ZOX:[id number] DOB: 1975/04/19 DOA: 10/01/2022     0 DOS: the patient was seen and examined on 10/02/2022   Brief hospital course: 47yo female with h/o CAD s/p anterior STEMI on 6/29 requiring balloon angioplasty, ESRD on TTS HD (missed HD on day of admission because she overslept), HTN, seizure d/o, anxiety, chronic systolic CHF, and afib on Eliquis who presented with chest pain.  Cardiology consulted and recommended repeat limited echo to reassess wall motion and re-evaluate the mobile density on her mitral valve.  She is on heparin infusion, Plavix, Lipitor.  Assessment and Plan:    Chest pain with elevated troponin, recent STEMI Patient was recently admitted in 08/2022 for anterior STEMI. LHC showed no atherosclerotic CAD but 100% stenosis of distal LAD and 100% of lateral OM3 lesion as well as 50% stenosis of mid LCx that appeared to be from embolization of thrombus down the left main. She underwent balloon angioplasty of both the distal LAD and OM3 lesion. Echo showed LVEF of 35-40% with multiple wall motion abnormalities and a mobile echodensity of mitral valve that was felt to likely be mobile MAC. She has continued to have recurrent chest pain since PCI felt to likely be due to panic attacks. However, today she presents with a nagging 10/10 chest pain that feels different than her symptoms prior to her recent MI.  EKG without evidence of acute ischemia Mildly elevated troponin with negative delta Appreciate cardiology input Repeat echogram in the morning On IV heparin for now Continue Lopressor 12.5 mg twice a day and Imdur 15mg  on nondialysis days. Continue Plavix Continue Lipitor 80 daily   Acute on Chronic HFrEF 6/30 echo showed LVEF of 35-40% with multiple wall motion abnormalities, small pericardial effusion, severe MAC, mobile echodensity on posterior mitral valve annulus.  During HD session on 7/16 she reports that she was given back fluid  due to low blood pressures Chest x-ray showed mild cardiomegaly with mild central pulmonary vascular congestion. Suspect mild volume overload associated with missed HD and volume added back at prior session Volume status managed via dialysis.  GDMT limited due to soft BP with patient requiring Midodrine on dialysis days. Continue Imdur and Lopressor as above. Repeat limited Echo as above.   Paroxysmal Atrial Fibrillation Diagnosed with new onset atrial fibrillation during recent admission. Maintaining sinus rhythm.  Continue Lopressor 12.5mg  twice daily. On anticoagulation with Eliquis at home but currently on IV Heparin   Mitral Valve Mass Echo during recent admission showed severe mitral annular calcification with mild MR and a mobile echodensity measuring 1.7 x 0.45cm that is attached to the atrial surface of the posterior mitral valve annulus felt to most likely be mobile MAC. However, differential also vegetation given she is on HD and cath concerning for coronary embolism Will repeat limited Echo and check blood cultures.   ESRD Patient on chronic TTS HD However, had to be given IV fluids during session on 7/16 and missed session 7/18 Nephrology prn order set utilized Patient was seen this AM while on HD Continue Sensipar, Auryxia, Sevelamer, Dialyvite  Depression/anxiety Continue Xanax, Latuda   GERD (gastroesophageal reflux disease) Continue Protonix 40 mg a day   Anemia of chronic kidney failure Hgb is 9.5, slightly lower than usual baseline Transfuse as needed for hemoglobin below 7  Prolonged QT interval Will attempt to avoid QT-prolonging medications such as PPI, nausea meds, SSRIs Repeat EKG    Chronic respiratory failure with hypoxia (HCC) Continue home  oxygen 3 L Continue Breo   Chronic pain I have reviewed this patient in the [place] Controlled Substances Reporting System.  She is receiving medications from only one provider and appears to be taking them as  prescribed. She IS at particularly high risk of opioid misuse, diversion, or overdose.  Will resume home Oxy 10 mg TID but change but to prn (as prescribed) Will attempt to avoid escalation of pain medication      Consultants: Cardiology Nephrology  Procedures: HD 7/19     Subjective: She reports substernal CP with SOB and abdominal pain as well as anxiety and panic that came on acutely after someone knocked on their door.  She had overslept and missed HD and this scared her.  She is feeling somewhat more calm now.  Mild substernal discomfort persisted this AM while in HD.   Objective: Vitals:   10/02/22 1208 10/02/22 1209  BP: (!) 99/58 112/65  Pulse: 61 (!) 58  Resp: 14 11  Temp:  98.4 F (36.9 C)  SpO2: 99% 97%    Intake/Output Summary (Last 24 hours) at 10/02/2022 1323 Last data filed at 10/02/2022 1209 Gross per 24 hour  Intake 785.96 ml  Output 3500 ml  Net -2714.04 ml   Filed Weights   10/01/22 1134 10/01/22 1837 10/02/22 0752  Weight: 76.4 kg 76.4 kg 87.7 kg    Exam:  General:  Appears calm and comfortable and is in NAD Eyes:   EOMI, normal lids, iris ENT:  grossly normal hearing, lips & tongue, mmm Neck:  no LAD, masses or thyromegaly Cardiovascular:  RRR, no m/r/g. No LE edema.  Respiratory:   CTA bilaterally with no wheezes/rales/rhonchi.  Normal respiratory effort. Abdomen:  soft, NT, ND Skin:  no rash or induration seen on limited exam Musculoskeletal:  grossly normal tone BUE/BLE, good ROM, no bony abnormality Lower extremity:  No LE edema.  Limited foot exam with no ulcerations.  2+ distal pulses. Psychiatric:  anxious mood and affect, speech fluent and appropriate, AOx3 Neurologic:  CN 2-12 grossly intact, moves all extremities in coordinated fashion  Data Reviewed: I have reviewed the patient's lab results since admission.  Pertinent labs for today include:  Na++ 134 BUN 49/Creatinine 9.97/GFR 4 Albumin 2.9 HS troponin 123, 116, 160 WBC  4.9 Hgb 9.5 Platelets 129   Family Communication: None present; she is capable of communicating with family at this time  Disposition: Status is: Observation The patient remains OBS appropriate and will d/c before 2 midnights.  Planned Discharge Destination: Home    Time spent: 35 minutes  Author: Jonah Blue, MD 10/02/2022 1:23 PM  For on call review [email or web address].

## 2022-10-02 NOTE — Assessment & Plan Note (Addendum)
Appreciate cardiology input Repeat echogram in the morning On IV heparin for now Continue Lopressor 12.5 mg twice a day and Imdur 15mg  on nondialysis days. Given need for dialysis in a.m. and soft blood pressures and bradycardia will hold off for tonight unless written by cardiology  Also continue Plavix And Lipitor 80 daily

## 2022-10-02 NOTE — Progress Notes (Signed)
Echo attempted, patient in dialysis. Will reattempt as schedule permits.  [facility] Kambrie Eddleman RDCS

## 2022-10-02 NOTE — Progress Notes (Signed)
ANTICOAGULATION CONSULT NOTE  Pharmacy Consult for Heparin infusion Indication: chest pain/ACS  Allergies  Allergen Reactions   Morphine Shortness Of Breath and Anaphylaxis   Prednisone Other (See Comments)    Other reaction(s): Other (See Comments) Muscle spasms Patient says prednisone causes her to cramp all over, muscle spasms uncontrolled   Tuna [Fish Allergy] Itching, Swelling, Rash and Other (See Comments)    Face droops also   Amlodipine Other (See Comments)    Angioedema (09/05/17 ED visit)   Iodinated Contrast Media Itching   Tape Itching    Adhesive tape   paper tape ok    Patient Measurements: Height: 5\' 4"  (162.6 cm) Weight: 87.7 kg (193 lb 5.5 oz) IBW/kg (Calculated) : 54.7 Heparin Dosing Weight: 70.8 kg  Vital Signs: Temp: 98.3 F (36.8 C) (07/19 0752) Temp Source: Oral (07/19 0733) BP: 110/68 (07/19 0930) Pulse Rate: 63 (07/19 0930)  Labs: Recent Labs    10/01/22 1136 10/01/22 1323 10/01/22 1438 10/01/22 2002 10/01/22 2145 10/02/22 0700  HGB 10.0*  --   --   --   --  9.5*  HCT 32.0*  --   --   --   --  30.4*  PLT 153  --   --   --   --  129*  APTT  --   --  34  --   --  44*  CREATININE 8.63*  --   --  9.07*  --   --   TROPONINIHS 126* 123*  --  116* 130*  --     Estimated Creatinine Clearance: 8.3 mL/min (A) (by C-G formula based on SCr of 9.07 mg/dL (H)).   Medical History: Past Medical History:  Diagnosis Date   Anemia    Anxiety    panic attacks   Arthritis    bilateral knees   Childhood asthma    Complication of anesthesia    "sometimes it does not work; didn't during LEEP OR" (01/21/2016)   Depression    no med   ESRD (end stage renal disease) on dialysis (HCC)    "TTS; [facility]; [facility]" (08/03/2016)   GERD (gastroesophageal reflux disease)    nexium prn   Gout    History of blood transfusion    "related to kidneys; I've had 4" (01/21/2016)   Hypertension    Migraine    last one 01/18/19   Preterm labor ~ 2014    Retina hole, left    Seizures (HCC)    "last one was in 2000; related to preeclampsia" (01/21/2016)    Medications:  Medications Prior to Admission  Medication Sig Dispense Refill Last Dose   acetaminophen (TYLENOL) 325 MG tablet Take 325 mg by mouth every 6 (six) hours as needed.      albuterol (PROVENTIL HFA;VENTOLIN HFA) 108 (90 Base) MCG/ACT inhaler Inhale 2 puffs into the lungs every 4 (four) hours as needed for wheezing or shortness of breath. 1 Inhaler 2    ALPRAZolam (XANAX) 1 MG tablet Take 1 mg by mouth 3 (three) times daily.   10/01/2022   apixaban (ELIQUIS) 5 MG TABS tablet Take 1 tablet (5 mg total) by mouth 2 (two) times daily. 180 tablet 1 10/01/2022   atorvastatin (LIPITOR) 80 MG tablet Take 1 tablet (80 mg total) by mouth daily. 90 tablet 1 10/01/2022   cinacalcet (SENSIPAR) 30 MG tablet Take 2 tablets (60 mg total) by mouth daily with supper. 60 tablet 1 09/30/2022   clopidogrel (PLAVIX) 75 MG tablet Take 1  tablet (75 mg total) by mouth daily with breakfast. 90 tablet 3 10/01/2022   cyclobenzaprine (FLEXERIL) 10 MG tablet Take 10 mg by mouth 2 (two) times daily as needed for muscle spasms.      ferric citrate (AURYXIA) 1 GM 210 MG(Fe) tablet Take 1 tablet (210 mg total) by mouth 3 (three) times daily with meals. 90 tablet 1 10/01/2022   fluticasone furoate-vilanterol (BREO ELLIPTA) 100-25 MCG/ACT AEPB Inhale 1 puff into the lungs daily. 60 each 2 10/01/2022   folic acid-vitamin b complex-vitamin c-selenium-zinc (DIALYVITE) 3 MG TABS tablet Take 1 tablet by mouth daily.   Past Week   isosorbide mononitrate (IMDUR) 30 MG 24 hr tablet Take a half tablet in the morning on non-dialysis days. 45 tablet 1 09/30/2022   lactulose (CHRONULAC) 10 GM/15ML solution Take 30 mLs by mouth daily as needed for mild constipation, moderate constipation or severe constipation.      loperamide (IMODIUM) 2 MG capsule Take 2 mg by mouth as needed for diarrhea or loose stools.      lurasidone (LATUDA) 20 MG  TABS tablet Take 20 mg by mouth daily.   10/01/2022   metoprolol tartrate (LOPRESSOR) 25 MG tablet Take 0.5 tablets (12.5 mg total) by mouth 2 (two) times daily. 45 tablet 1 10/01/2022   midodrine (PROAMATINE) 10 MG tablet Take 10 mg by mouth 3 (three) times daily as needed (Hypotension).   09/30/2022   mupirocin ointment (BACTROBAN) 2 % Apply 1 Application topically 2 (two) times daily.      ondansetron (ZOFRAN-ODT) 8 MG disintegrating tablet Take 8 mg by mouth 2 (two) times daily.   10/01/2022   Oxycodone HCl 10 MG TABS Take 1 tablet (10 mg total) by mouth 3 (three) times daily as needed (severe pain). (Patient taking differently: Take 10 mg by mouth 3 (three) times daily.) 5 tablet 0 Past Week   pantoprazole (PROTONIX) 40 MG tablet Take 1 tablet (40 mg total) by mouth daily. 90 tablet 1 10/01/2022   sevelamer carbonate (RENVELA) 800 MG tablet Take 1,600 mg by mouth 3 (three) times daily.   10/01/2022   NARCAN 4 MG/0.1ML LIQD nasal spray kit Place 1 spray into the nose as needed (accidental overdose).       Assessment: 47 yo F presents with chest pain that started 7/18 AM. Pt reports history of MI a few weeks ago and history of ESRD with HD on TTSa (last HD on 09/29/22). Patient also was diagnosed with atrial fibrillation and takes apixaban 5mg  BID. Last dose of apixaban was 07:30 on 10/01/22. Pharmacy consulted to dose heparin infusion per ACS protocol.  Initial aPTT is subtherapeutic at 44 seconds, CBC stable.  Goal of Therapy:  Heparin level 0.3-0.7 units/ml aPTT 66-102 seconds Monitor platelets by anticoagulation protocol: Yes   Plan:  Increase heparin to 1100 units/h Recheck aPTT in 8h  Fredonia Highland, PharmD, [place], [facility] Clinical Pharmacist [phone] Please check AMION for all [facility]acy numbers 10/02/2022

## 2022-10-03 ENCOUNTER — Observation Stay (HOSPITAL_COMMUNITY): Payer: 59

## 2022-10-03 DIAGNOSIS — R072 Precordial pain: Secondary | ICD-10-CM | POA: Diagnosis not present

## 2022-10-03 DIAGNOSIS — R079 Chest pain, unspecified: Secondary | ICD-10-CM | POA: Diagnosis not present

## 2022-10-03 LAB — RENAL FUNCTION PANEL
Albumin: 2.9 g/dL — ABNORMAL LOW (ref 3.5–5.0)
Anion gap: 14 (ref 5–15)
BUN: 39 mg/dL — ABNORMAL HIGH (ref 6–20)
CO2: 27 mmol/L (ref 22–32)
Calcium: 8.5 mg/dL — ABNORMAL LOW (ref 8.9–10.3)
Chloride: 96 mmol/L — ABNORMAL LOW (ref 98–111)
Creatinine, Ser: 7.04 mg/dL — ABNORMAL HIGH (ref 0.44–1.00)
GFR, Estimated: 7 mL/min — ABNORMAL LOW (ref >60)
Glucose, Bld: 110 mg/dL — ABNORMAL HIGH (ref 70–99)
Phosphorus: 4.7 mg/dL — ABNORMAL HIGH (ref 2.5–4.6)
Potassium: 4.2 mmol/L (ref 3.5–5.1)
Sodium: 137 mmol/L (ref 135–145)

## 2022-10-03 LAB — CBC
HCT: 30 % — ABNORMAL LOW (ref 36.0–46.0)
Hemoglobin: 9.2 g/dL — ABNORMAL LOW (ref 12.0–15.0)
MCH: 26 pg (ref 26.0–34.0)
MCHC: 30.7 g/dL (ref 30.0–36.0)
MCV: 84.7 fL (ref 80.0–100.0)
Platelets: 156 10*3/uL (ref 150–400)
RBC: 3.54 MIL/uL — ABNORMAL LOW (ref 3.87–5.11)
RDW: 18.1 % — ABNORMAL HIGH (ref 11.5–15.5)
WBC: 5.5 10*3/uL (ref 4.0–10.5)
nRBC: 0 % (ref 0.0–0.2)

## 2022-10-03 LAB — HEPATITIS B SURFACE ANTIBODY, QUANTITATIVE: Hep B S AB Quant (Post): 53.5 m[IU]/mL

## 2022-10-03 LAB — CULTURE, BLOOD (ROUTINE X 2): Culture: NO GROWTH

## 2022-10-03 MED ORDER — DIPHENHYDRAMINE HCL 25 MG PO CAPS
ORAL_CAPSULE | ORAL | Status: AC
  Filled 2022-10-03: qty 1

## 2022-10-03 MED ORDER — DIPHENHYDRAMINE HCL 25 MG PO CAPS
25.0000 mg | ORAL_CAPSULE | Freq: Four times a day (QID) | ORAL | Status: DC | PRN
  Administered 2022-10-03: 25 mg via ORAL

## 2022-10-03 NOTE — Procedures (Signed)
Patient was seen on dialysis and the procedure was supervised.  BFR 400  Via AVF BP is  98/59.   Patient appears to be tolerating treatment well. SOB much better.  Desiree [name] Jaynie Collins 10/03/2022

## 2022-10-03 NOTE — Discharge Summary (Signed)
Physician Discharge Summary   Patient: Desiree [name] MRN: [id number] DOB: 05/17/1975  Admit date:     10/01/2022  Discharge date: 10/03/22  Discharge Physician: Jonah Blue   PCP: Jearld Lesch, MD   Recommendations at discharge:   Return to usual dialysis schedule; do not miss dialysis Chest pain appears to be non-cardiac in nature, echocardiogram is reassuring Back x-ray ordered and is reassuring Take home pain medication/Flexeril as needed for pain Follow up with orthopedics as needed as an outpatient Follow up with PCP in 1-2 weeks Follow up with cardiology in September as scheduled  Discharge Diagnoses: Principal Problem:   Chest pain, rule out acute myocardial infarction Active Problems:   Depression   ESRD (end stage renal disease) on dialysis (HCC)   GERD (gastroesophageal reflux disease)   Anemia of chronic kidney failure   Chronic pain   Pulmonary edema   Anxiety   Prolonged QT interval   Chronic respiratory failure with hypoxia (HCC)   Atrial fibrillation (HCC)   Acute on chronic systolic CHF (congestive heart failure) (HCC)   Nonrheumatic mitral valve regurgitation   Elevated troponin    Hospital Course: 47yo female with h/o CAD s/p anterior STEMI on 6/29 requiring balloon angioplasty, ESRD on TTS HD (missed HD on day of admission because she overslept), HTN, seizure d/o, anxiety, chronic systolic CHF, and afib on Eliquis who presented with chest pain.  Cardiology consulted and recommended repeat limited echo to reassess wall motion and re-evaluate the mobile density on her mitral valve.  She is on heparin infusion, Plavix, Lipitor.  Repeat echo with EF improved to 45-50% with regional WMA, moderate pericardial effusion without tamponade.  Assessment and Plan:  Chest pain with elevated troponin, recent STEMI Patient was recently admitted in 08/2022 for anterior STEMI.  LHC showed no atherosclerotic CAD but 100% stenosis of distal LAD and 100% of  lateral OM3 lesion as well as 50% stenosis of mid LCx that appeared to be from embolization of thrombus down the left main.  She underwent balloon angioplasty of both the distal LAD and OM3 lesion. Echo showed LVEF of 35-40% with multiple wall motion abnormalities and a mobile echodensity of mitral valve that was felt to likely be mobile MAC. She has continued to have recurrent chest pain since PCI felt to likely be due to panic attacks.  EKG without evidence of acute ischemia Mildly elevated troponin with negative delta Continue Lopressor 12.5 mg twice a day and Imdur 15mg  on nondialysis days. Continue Plavix Continue Lipitor 80 daily Cardiology has consulted and believes there is no role for repeat invasive evaluation in this setting Cardiology ordered echo and Dr. Royann Shivers has reviewed and does not see significant change from prior Recommend outpatient cardiology f/u in September as scheduled  Acute on Chronic HFrEF 6/30 echo showed LVEF of 35-40% with multiple wall motion abnormalities, small pericardial effusion, severe MAC, mobile echodensity on posterior mitral valve annulus.  Echo on 7/19 improved to EF 45-50 During HD session on 7/16 she reports that she was given back fluid due to low blood pressures Chest x-ray showed mild cardiomegaly with mild central pulmonary vascular congestion. Suspect mild volume overload associated with missed HD and volume added back at prior session Volume status managed via dialysis.  GDMT limited due to soft BP with patient requiring Midodrine on dialysis days. Continue Imdur and Lopressor as above.  BLE Pain LS spine xray with mild degenerative disc and joint disease at L5-S1 Outpatient orthopedics f/u as needed  Paroxysmal Atrial Fibrillation Diagnosed with new onset atrial fibrillation during recent admission. Maintaining sinus rhythm.  Continue Lopressor 12.5mg  twice daily. On anticoagulation with Eliquis   Mitral Valve Mass Echo during  recent admission showed severe mitral annular calcification with mild MR and a mobile echodensity measuring 1.7 x 0.45cm that is attached to the atrial surface of the posterior mitral valve annulus felt to most likely be mobile MAC. However, differential also vegetation given she is on HD and cath concerning for coronary embolism Repeat echo reassuring   ESRD Patient on chronic TTS HD However, had to be given IV fluids during session on 7/16 and missed session 7/18 Nephrology prn order set utilized Patient was seen this AM while on HD Continue Sensipar, Auryxia, Sevelamer, Dialyvite   Depression/anxiety Continue Xanax, Latuda   GERD (gastroesophageal reflux disease) Continue Protonix 40 mg a day   Anemia of chronic kidney failure Hgb is 9.5, slightly lower than usual baseline Transfuse as needed for hemoglobin below 7   Prolonged QT interval Will attempt to avoid QT-prolonging medications such as PPI, nausea meds, SSRIs Repeat EKG    Chronic respiratory failure with hypoxia (HCC) Continue home oxygen 3 L Continue Breo   Chronic pain I have reviewed this patient in the [place] Controlled Substances Reporting System.  She is receiving medications from only one provider and appears to be taking them as prescribed. She IS at particularly high risk of opioid misuse, diversion, or overdose.  Will resume home Oxy 10 mg TID but change but to prn (as prescribed) Will attempt to avoid escalation of pain medication   Obesity Body mass index is 28.15 kg/m.Marland Kitchen  Weight loss should be encouraged Outpatient PCP/bariatric medicine/bariatric surgery f/u encouraged       Consultants: Cardiology Nephrology   Procedures: HD 7/19 HD 7/20     Pain control - [facility] Controlled Substance Reporting System database was reviewed. and patient was instructed, not to drive, operate heavy machinery, perform activities at heights, swimming or participation in water activities or provide  baby-sitting services while on Pain, Sleep and Anxiety Medications; until their outpatient Physician has advised to do so again. Also recommended to not to take more than prescribed Pain, Sleep and Anxiety Medications.    Disposition: Home Diet recommendation:  Renal diet DISCHARGE MEDICATION: Allergies as of 10/03/2022       Reactions   Morphine Shortness Of Breath, Anaphylaxis   Prednisone Other (See Comments)   Other reaction(s): Other (See Comments) Muscle spasms Patient says prednisone causes her to cramp all over, muscle spasms uncontrolled   Tuna [fish Allergy] Itching, Swelling, Rash, Other (See Comments)   Face droops also   Amlodipine Other (See Comments)   Angioedema (09/05/17 ED visit)   Iodinated Contrast Media Itching   Tape Itching   Adhesive tape   paper tape ok        Medication List     TAKE these medications    acetaminophen 325 MG tablet Commonly known as: TYLENOL Take 325 mg by mouth every 6 (six) hours as needed.   albuterol 108 (90 Base) MCG/ACT inhaler Commonly known as: VENTOLIN HFA Inhale 2 puffs into the lungs every 4 (four) hours as needed for wheezing or shortness of breath.   ALPRAZolam 1 MG tablet Commonly known as: XANAX Take 1 mg by mouth 3 (three) times daily.   apixaban 5 MG Tabs tablet Commonly known as: ELIQUIS Take 1 tablet (5 mg total) by mouth 2 (two) times daily.   atorvastatin  80 MG tablet Commonly known as: LIPITOR Take 1 tablet (80 mg total) by mouth daily.   cinacalcet 30 MG tablet Commonly known as: SENSIPAR Take 2 tablets (60 mg total) by mouth daily with supper.   clopidogrel 75 MG tablet Commonly known as: PLAVIX Take 1 tablet (75 mg total) by mouth daily with breakfast.   cyclobenzaprine 10 MG tablet Commonly known as: FLEXERIL Take 10 mg by mouth 2 (two) times daily as needed for muscle spasms.   ferric citrate 1 GM 210 MG(Fe) tablet Commonly known as: AURYXIA Take 1 tablet (210 mg total) by mouth 3  (three) times daily with meals.   fluticasone furoate-vilanterol 100-25 MCG/ACT Aepb Commonly known as: Breo Ellipta Inhale 1 puff into the lungs daily.   folic acid-vitamin b complex-vitamin c-selenium-zinc 3 MG Tabs tablet Take 1 tablet by mouth daily.   isosorbide mononitrate 30 MG 24 hr tablet Commonly known as: IMDUR Take a half tablet in the morning on non-dialysis days.   lactulose 10 GM/15ML solution Commonly known as: CHRONULAC Take 30 mLs by mouth daily as needed for mild constipation, moderate constipation or severe constipation.   loperamide 2 MG capsule Commonly known as: IMODIUM Take 2 mg by mouth as needed for diarrhea or loose stools.   lurasidone 20 MG Tabs tablet Commonly known as: LATUDA Take 20 mg by mouth daily.   metoprolol tartrate 25 MG tablet Commonly known as: LOPRESSOR Take 0.5 tablets (12.5 mg total) by mouth 2 (two) times daily.   midodrine 10 MG tablet Commonly known as: PROAMATINE Take 10 mg by mouth 3 (three) times daily as needed (Hypotension).   mupirocin ointment 2 % Commonly known as: BACTROBAN Apply 1 Application topically 2 (two) times daily.   Narcan 4 MG/0.1ML Liqd nasal spray kit Generic drug: naloxone Place 1 spray into the nose as needed (accidental overdose).   ondansetron 8 MG disintegrating tablet Commonly known as: ZOFRAN-ODT Take 8 mg by mouth 2 (two) times daily.   Oxycodone HCl 10 MG Tabs Take 1 tablet (10 mg total) by mouth 3 (three) times daily as needed (severe pain). What changed: when to take this   pantoprazole 40 MG tablet Commonly known as: Protonix Take 1 tablet (40 mg total) by mouth daily.   sevelamer carbonate 800 MG tablet Commonly known as: RENVELA Take 1,600 mg by mouth 3 (three) times daily.        Discharge Exam: Filed Weights   10/02/22 0752 10/03/22 0815 10/03/22 1238  Weight: 87.7 kg 77.4 kg 74.4 kg   Subjective: Chest pain has completely resolved and she is pretty sure it was due  to anxiety.  Her only complaints today were lack of a ride home today (she will work on it) and BLE pain.  She reports h/o B knee pain requiring corticosteroids in the past and may have to go to orthopedics as an outpatient. She is open to having a lumbar spine xray prior to dc today.     Objective:     Vitals:    10/03/22 0447 10/03/22 0712  BP: (!) 144/63    Pulse: 65 65  Resp: 18    Temp: 97.8 F (36.6 C)    SpO2: 100% 100%      Intake/Output Summary (Last 24 hours) at 10/03/2022 0741 Last data filed at 10/02/2022 1700    Gross per 24 hour  Intake 234 ml  Output 3500 ml  Net -3266 ml         [employer]  10/01/22 1134 10/01/22 1837 10/02/22 0752  Weight: 76.4 kg 76.4 kg 87.7 kg    Vitals:   10/03/22 1207 10/03/22 1514  BP: (!) 95/58 104/64  Pulse: 63 66  Resp: (!) 21 18  Temp: 98.2 F (36.8 C) 99.1 F (37.3 C)  SpO2: 100% 100%      Exam:    General:  Appears calm and comfortable and is in NAD, seen in HD Eyes:   EOMI, normal lids, iris ENT:  grossly normal hearing, lips & tongue, mmm Neck:  no LAD, masses or thyromegaly Cardiovascular:  RRR, no m/r/g. No LE edema.  Respiratory:   CTA bilaterally with no wheezes/rales/rhonchi.  Normal respiratory effort. Abdomen:  soft, NT, ND Skin:  no rash or induration seen on limited exam Musculoskeletal:  grossly normal tone BUE/BLE, good ROM, no bony abnormality Lower extremity:  No LE edema.  Limited foot exam with no ulcerations.  2+ distal pulses. Psychiatric:  anxious mood and affect, speech fluent and appropriate, AOx3 Neurologic:  CN 2-12 grossly intact, moves all extremities in coordinated fashion   Data Reviewed: I have reviewed the patient's lab results since admission.  Pertinent labs for today include:   WBC 5.5 Hgb 9.2    Condition at discharge: good  The results of significant diagnostics from this hospitalization (including imaging, microbiology, ancillary and laboratory) are listed below for  reference.   Imaging Studies: DG Lumbar Spine 2-3 Views  Result Date: 10/03/2022 CLINICAL DATA:  Bilateral leg pain. EXAM: LUMBAR SPINE - 2-3 VIEW COMPARISON:  Lumbar spine radiographs dated 12/07/2017 and CT abdomen pelvis dated 05/02/2018. FINDINGS: There is no evidence of lumbar spine fracture. Alignment is normal. There is mild degenerative disc and joint disease at L5-S1. No evidence of neuroforaminal stenosis. A peripherally calcified cyst overlying the left kidney is unchanged. IMPRESSION: Mild degenerative disc and joint disease at L5-S1. Electronically Signed   By: Romona Curls M.D.   On: 10/03/2022 15:27   ECHOCARDIOGRAM LIMITED  Result Date: 10/02/2022    ECHOCARDIOGRAM LIMITED REPORT   Patient Name:   Desiree [name] Date of Exam: 10/02/2022 Medical Rec #:  [id number]       Height:       64.0 in Accession #:    [id number]      Weight:       193.3 lb Date of Birth:  1975/08/27        BSA:          1.928 m Patient Age:    47 years        BP:           112/65 mmHg Patient Gender: F               HR:           58 bpm. Exam Location:  Inpatient Procedure: Limited Echo, Cardiac Doppler, Limited Color Doppler, Strain Analysis            and 3D Echo Indications:    Chest pain  History:        Patient has prior history of Echocardiogram examinations, most                 recent 09/13/2022. CHF, Arrythmias:Atrial Fibrillation; Risk                 Factors:Hypertension and Non-Smoker.  Sonographer:    Dondra Prader RVT RCS Referring Phys: [id number] Corrin Parker  Sonographer Comments: Global longitudinal strain was attempted. IMPRESSIONS  1. Left ventricular ejection fraction, by estimation, is 45 to 50%. The left ventricle has mildly decreased function. The left ventricle demonstrates regional wall motion abnormalities with akinesis of the apical lateral wall and true apex.  2. Left atrial size was mildly dilated.  3. Right ventricular systolic function is normal. The right ventricular size is normal.  4.  Moderate mitral annular calcification.  5. The aortic valve is tricuspid. There is mild calcification of the aortic valve.  6. The inferior vena cava is dilated in size with >50% respiratory variability, suggesting right atrial pressure of 8 mmHg.  7. Moderate pericardial effusion. The pericardial effusion is circumferential. There is no evidence of cardiac tamponade.  8. Limited echo FINDINGS  Left Ventricle: Left ventricular ejection fraction, by estimation, is 45 to 50%. The left ventricle has mildly decreased function. The left ventricle demonstrates regional wall motion abnormalities. The left ventricular internal cavity size was normal in size. There is no left ventricular hypertrophy. Right Ventricle: The right ventricular size is normal. Right ventricular systolic function is normal. Left Atrium: Left atrial size was mildly dilated. Right Atrium: Right atrial size was normal in size. Pericardium: A moderately sized pericardial effusion is present. The pericardial effusion is circumferential. There is no evidence of cardiac tamponade. Mitral Valve: Moderate mitral annular calcification. Aortic Valve: The aortic valve is tricuspid. There is mild calcification of the aortic valve. Venous: The inferior vena cava is dilated in size with greater than 50% respiratory variability, suggesting right atrial pressure of 8 mmHg. LEFT VENTRICLE PLAX 2D LVIDd:         5.00 cm LVIDs:         3.40 cm LV PW:         1.10 cm LV IVS:        0.90 cm                             3D Volume EF:                             3D EF:        57 % LV Volumes (MOD)            LV EDV:       159 ml LV vol d, MOD A2C: 130.0 ml LV ESV:       69 ml LV vol d, MOD A4C: 121.0 ml LV SV:        90 ml LV vol s, MOD A2C: 51.1 ml LV vol s, MOD A4C: 43.0 ml LV SV MOD A2C:     78.9 ml LV SV MOD A4C:     121.0 ml LV SV MOD BP:      85.6 ml LEFT ATRIUM         Index LA diam:    3.80 cm 1.97 cm/m Dalton McleanMD Electronically signed by Wilfred Lacy  Signature Date/Time: 10/02/2022/4:07:01 PM    Final    DG Chest Portable 1 View  Result Date: 10/01/2022 CLINICAL DATA:  Chest pain. EXAM: PORTABLE CHEST 1 VIEW COMPARISON:  September 12, 2022. FINDINGS: Mild cardiomegaly is noted with mild central pulmonary vascular congestion. No consolidative process is noted. Bony thorax is unremarkable. IMPRESSION: Mild cardiomegaly with mild central pulmonary vascular congestion. Electronically Signed   By: Lupita Raider M.D.   On: 10/01/2022 12:03   ECHOCARDIOGRAM COMPLETE BUBBLE STUDY  Result Date:  09/13/2022    ECHOCARDIOGRAM REPORT   Patient Name:   Desiree [name] Date of Exam: 09/13/2022 Medical Rec #:  [id number]       Height:       64.0 in Accession #:    [id number]      Weight:       168.2 lb Date of Birth:  13-Mar-1976        BSA:          1.818 m Patient Age:    47 years        BP:           126/85 mmHg Patient Gender: F               HR:           86 bpm. Exam Location:  Inpatient Procedure: 2D Echo, Color Doppler, Cardiac Doppler and Saline Contrast Bubble            Study Indications:    acute myocardial infarction  History:        Patient has prior history of Echocardiogram examinations, most                 recent 01/05/2022. CHF, End stage renal disease,                 Arrythmias:Atrial Fibrillation, Signs/Symptoms:Chest Pain; Risk                 Factors:Hypertension and cocaine use.  Sonographer:    Delcie Roch RDCS Referring Phys: [id number] JAYADEEP S VARANASI IMPRESSIONS  1. Left ventricular ejection fraction, by estimation, is 35 to 40%. The left ventricle has moderately decreased function. The left ventricle demonstrates regional wall motion abnormalities (see scoring diagram/findings for description). Left ventricular  diastolic parameters are indeterminate.  2. Right ventricular systolic function is normal. The right ventricular size is normal. There is severely elevated pulmonary artery systolic pressure.  3. A small pericardial effusion is present.  The pericardial effusion is circumferential.  4. The mitral valve is abnormal. Mild mitral valve regurgitation. No evidence of mitral stenosis. Severe mitral annular calcification (MAC). There is a mobile echodensity measuring 1.7 x 0.45 cm that is attached to the atrial surface of the posterior mitral valve annulus. Seen in prior echo images. Differential diagnosis include mobile MAC, calcified thrombus, calcified vegetation. 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 Clinical correlation is recommended. Consider TEE for further evaluation.  5. The tricuspid valve is abnormal. Tricuspid valve regurgitation is moderate to severe.  6. The aortic valve was not well visualized. Aortic valve regurgitation is not visualized. No aortic stenosis is present.  7. The inferior vena cava is dilated in size with <50% respiratory variability, suggesting right atrial pressure of 15 mmHg.  8. Agitated saline contrast bubble study was negative, with no evidence of any interatrial shunt. Comparison(s): Changes from prior study are noted. LVEF worsened to 35-40% with RWMA. FINDINGS  Left Ventricle: Left ventricular ejection fraction, by estimation, is 35 to 40%. The left ventricle has moderately decreased function. The left ventricle demonstrates regional wall motion abnormalities. The left ventricular internal cavity size was normal in size. There is no left ventricular hypertrophy. Left ventricular diastolic parameters are indeterminate.  LV Wall Scoring: The apical lateral segment, apical inferior segment, and apex are akinetic. The entire anterior wall, mid and distal anterior septum, inferior septum, and posterior wall are hypokinetic. The antero-lateral wall, inferior wall, and basal anteroseptal segment are normal. Right Ventricle: The right  ventricular size is normal. No increase in right ventricular wall thickness. Right ventricular systolic function is normal. There is severely elevated pulmonary artery systolic pressure. The  tricuspid regurgitant velocity is 3.38 m/s, and with an assumed right atrial pressure of 15 mmHg, the estimated right ventricular systolic pressure is 60.7 mmHg. Left Atrium: Left atrial size was normal in size. Right Atrium: Right atrial size was normal in size. Pericardium: A small pericardial effusion is present. The pericardial effusion is circumferential. Mitral Valve: The mitral valve is abnormal. Severe mitral annular calcification. Mild mitral valve regurgitation. No evidence of mitral valve stenosis. MV peak gradient, 14.1 mmHg. The mean mitral valve gradient is 4.3 mmHg. Tricuspid Valve: The tricuspid valve is abnormal. Tricuspid valve regurgitation is moderate to severe. No evidence of tricuspid stenosis. Aortic Valve: The aortic valve was not well visualized. There is moderate aortic valve annular calcification. Aortic valve regurgitation is not visualized. No aortic stenosis is present. Pulmonic Valve: The pulmonic valve was normal in structure. Pulmonic valve regurgitation is mild. No evidence of pulmonic stenosis. Aorta: The aortic root and ascending aorta are structurally normal, with no evidence of dilitation. Venous: The inferior vena cava is dilated in size with less than 50% respiratory variability, suggesting right atrial pressure of 15 mmHg. IAS/Shunts: No atrial level shunt detected by color flow Doppler. Agitated saline contrast was given intravenously to evaluate for intracardiac shunting. Agitated saline contrast bubble study was negative, with no evidence of any interatrial shunt.  LEFT VENTRICLE PLAX 2D LVIDd:         4.70 cm LVIDs:         3.00 cm LV PW:         0.90 cm LV IVS:        0.80 cm LVOT diam:     1.60 cm LV SV:         38 LV SV Index:   21 LVOT Area:     2.01 cm  RIGHT VENTRICLE             IVC RV Basal diam:  2.70 cm     IVC diam: 2.30 cm RV S prime:     11.70 cm/s TAPSE (M-mode): 1.3 cm LEFT ATRIUM             Index        RIGHT ATRIUM           Index LA diam:        4.10  cm 2.26 cm/m   RA Area:     13.50 cm LA Vol (A2C):   70.9 ml 39.01 ml/m  RA Volume:   29.10 ml  16.01 ml/m LA Vol (A4C):   51.0 ml 28.06 ml/m LA Biplane Vol: 61.3 ml 33.73 ml/m  AORTIC VALVE LVOT Vmax:   110.00 cm/s LVOT Vmean:  71.933 cm/s LVOT VTI:    0.190 m  AORTA Ao Root diam: 2.70 cm Ao Asc diam:  3.00 cm MITRAL VALVE             TRICUSPID VALVE MV Area VTI:  1.06 cm   TR Peak grad:   45.7 mmHg MV Peak grad: 14.1 mmHg  TR Vmax:        338.00 cm/s MV Mean grad: 4.3 mmHg MV Vmax:      1.88 m/s   SHUNTS MV Vmean:     89.1 cm/s  Systemic VTI:  0.19 m  Systemic Diam: 1.60 cm Vishnu Priya Mallipeddi Electronically signed by Winfield Rast Mallipeddi Signature Date/Time: 09/13/2022/7:35:41 PM    Final    VAS [place] LOWER EXTREMITY VENOUS (DVT)  Result Date: 09/13/2022  Lower Venous DVT Study Patient Name:  Desiree [name]  Date of Exam:   09/13/2022 Medical Rec #: [id number]        Accession #:    [id number] Date of Birth: 1975/12/16         Patient Gender: F Patient Age:   89 years Exam Location:  [facility] Procedure:      VAS [place] LOWER EXTREMITY VENOUS (DVT) Referring Phys: Gerri Spore O'NEAL --------------------------------------------------------------------------------  Indications: Coronary embolism.  Limitations: Pain with compression, status post right femoral cath. Comparison Study: Prior negative bilateral LEV done 01/14/21 Performing Technologist: Sherren Kerns RVS  Examination Guidelines: A complete evaluation includes B-mode imaging, spectral Doppler, color Doppler, and power Doppler as needed of all accessible portions of each vessel. Bilateral testing is considered an integral part of a complete examination. Limited examinations for reoccurring indications may be performed as noted. The reflux portion of the exam is performed with the patient in reverse Trendelenburg.  +---------+---------------+---------+-----------+---------------+--------------+ RIGHT     CompressibilityPhasicitySpontaneityProperties     Thrombus Aging +---------+---------------+---------+-----------+---------------+--------------+ CFV                     Yes      Yes        pulsatile      patent by                                                  waveforms      color and                                                                 Doppler        +---------+---------------+---------+-----------+---------------+--------------+ FV Prox  Full           Yes      Yes        pulsatile                                                                 waveforms                     +---------+---------------+---------+-----------+---------------+--------------+ FV Mid   Full                                                             +---------+---------------+---------+-----------+---------------+--------------+ FV DistalFull                                                             +---------+---------------+---------+-----------+---------------+--------------+  PFV      Full                                                             +---------+---------------+---------+-----------+---------------+--------------+ POP                     Yes      Yes        pulsatile      patent by                                                  waveforms      color and                                                                 Doppler        +---------+---------------+---------+-----------+---------------+--------------+ PTV      Full                                                             +---------+---------------+---------+-----------+---------------+--------------+ PERO     Full                                                             +---------+---------------+---------+-----------+---------------+--------------+   +---------+---------------+---------+-----------+---------------+--------------+ LEFT      CompressibilityPhasicitySpontaneityProperties     Thrombus Aging +---------+---------------+---------+-----------+---------------+--------------+ CFV                                         pulsatile      patent by                                                  waveforms      color and                                                                 Doppler        +---------+---------------+---------+-----------+---------------+--------------+ FV Prox  pulsatile      patent by                                                  waveforms      color and                                                                 Doppler        +---------+---------------+---------+-----------+---------------+--------------+ FV Mid                                      pulsatile      patent by                                                  waveforms      color and                                                                 Doppler        +---------+---------------+---------+-----------+---------------+--------------+ FV Distal                                   pulsatile      patent by                                                  waveforms      color and                                                                 Doppler        +---------+---------------+---------+-----------+---------------+--------------+ PFV                                         pulsatile      patent by                                                  waveforms      color and  Doppler        +---------+---------------+---------+-----------+---------------+--------------+ POP                                         pulsatile      patent by                                                  waveforms      color and                                                                  Doppler        +---------+---------------+---------+-----------+---------------+--------------+ PTV      Full                                                             +---------+---------------+---------+-----------+---------------+--------------+ PERO     Full                                                             +---------+---------------+---------+-----------+---------------+--------------+     Summary: BILATERAL: - No evidence of deep vein thrombosis seen in the lower extremities, bilaterally. -No evidence of popliteal cyst, bilaterally.   *See table(s) above for measurements and observations. Electronically signed by Coral Else MD on 09/13/2022 at 7:00:56 PM.    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  Balloon angioplasty was performed using a BALLN EMERGE MR 2.0X12.   Post intervention, there is a 0% residual stenosis.   Dist LAD lesion is 100% stenosed.  Balloon angioplasty was performed using a BALLN EMERGE MR 2.0X12.   Post intervention, there is a 100% residual stenosis.   Mid Cx lesion is 50% stenosed.  Thrombotic.   LV end diastolic pressure is moderately elevated.   Recommend to resume Apixaban, at currently prescribed dose and frequency.   Recommend concurrent antiplatelet therapy of Clopidogrel 75mg  daily for 12 months. No atherosclerotic coronary artery disease.  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  Angioplasty done in both areas but neither area receiving TIMI-3 flow.  Resume heparin 6 hours after femoral sheath removal.  She will need echocardiogram to look for source of embolism.  Would consider long-term oral anticoagulation given her presentation here today.  Could also use clopidogrel for antiplatelet therapy depending on whether or not she has bleeding issues.  Will watch  in 2 heart. I spoke to her daughter, Mel Almond, and conveyed the results.  Result Date: 09/13/2022   Lat 3rd Mrg  lesion is 100% stenosed.  Balloon angioplasty was performed using a BALLN EMERGE MR 2.0X12.   Post intervention, there is a 0% residual stenosis.   Dist LAD lesion is 100% stenosed.  Balloon angioplasty was performed using a BALLN EMERGE MR 2.0X12.   Post intervention, there is a 100% residual stenosis.   Mid Cx lesion is 50% stenosed.  Thrombotic.   LV end diastolic pressure is moderately elevated.   Recommend to resume Apixaban, at currently prescribed dose and frequency.   Recommend concurrent antiplatelet therapy of Clopidogrel 75mg  daily for 12 months. No atherosclerotic coronary artery disease.  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  Angioplasty done in both areas but neither area receiving TIMI-3 flow.  Resume heparin 6 hours after femoral sheath removal.  She will need echocardiogram to look for source of embolism.  Would consider long-term oral anticoagulation given her presentation here today.  Could also use clopidogrel for antiplatelet therapy depending on whether or not she has bleeding issues.  Will watch in 2 heart. I spoke to her daughter, Mel Almond, and conveyed the results.   DG Chest Port 1 View  Result Date: 09/13/2022 CLINICAL DATA:  Chest pain EXAM: PORTABLE CHEST 1 VIEW COMPARISON:  04/30/2022 FINDINGS: Cardiomegaly with mild perihilar edema. No definite pleural effusions. No pneumothorax. IMPRESSION: Cardiomegaly with mild perihilar edema. Electronically Signed   By: Charline Bills M.D.   On: 09/13/2022 00:16    Microbiology: Results for orders placed or performed during the hospital encounter of 10/01/22  Culture, blood (Routine X 2) w Reflex to ID Panel     Status: None (Preliminary result)   Collection Time: 10/01/22  9:46 PM   Specimen: BLOOD  Result Value Ref Range Status   Specimen  Description BLOOD BLOOD RIGHT HAND  Final   Special Requests   Final    BOTTLES DRAWN AEROBIC AND ANAEROBIC Blood Culture adequate volume   Culture   Final    NO GROWTH 2 DAYS Performed at [facility], [address], [place], [place] [address]    Report Status PENDING  Incomplete  Culture, blood (Routine X 2) w Reflex to ID Panel     Status: None (Preliminary result)   Collection Time: 10/01/22  9:46 PM   Specimen: BLOOD  Result Value Ref Range Status   Specimen Description BLOOD BLOOD RIGHT HAND  Final   Special Requests   Final    BOTTLES DRAWN AEROBIC AND ANAEROBIC Blood Culture adequate volume   Culture   Final    NO GROWTH 2 DAYS Performed at [facility], [address] [place], [place] [address]    Report Status PENDING  Incomplete    Labs: CBC: Recent Labs  Lab 10/01/22 1136 10/02/22 0700 10/03/22 0354  WBC 5.8 4.9 5.5  HGB 10.0* 9.5* 9.2*  HCT 32.0* 30.4* 30.0*  MCV 84.7 84.0 84.7  PLT 153 129* 156   Basic Metabolic Panel: Recent Labs  Lab 10/01/22 1136 10/01/22 2002 10/02/22 0700 10/03/22 0836  NA 139 138 134* 137  K 4.5 4.1 4.6 4.2  CL 95* 93* 94* 96*  CO2 31 26 26 27   GLUCOSE 90 109* 81 110*  BUN 41* 42* 49* 39*  CREATININE 8.63* 9.07* 9.79* 7.04*  CALCIUM 8.8* 8.5* 7.9* 8.5*  MG  --   --  2.3  --   PHOS  --   --  5.1*  4.7*   Liver Function Tests: Recent Labs  Lab 10/01/22 2002 10/02/22 0700 10/03/22 0836  AST 14* 13*  --   ALT 16 14  --   ALKPHOS 98 91  --   BILITOT 0.5 0.7  --   PROT 7.6 6.9  --   ALBUMIN 3.2* 2.9* 2.9*   CBG: No results for input(s): "GLUCAP" in the last 168 hours.  Discharge time spent: greater than 30 minutes.  Signed: Jonah Blue, MD [facility] 10/03/2022

## 2022-10-03 NOTE — Progress Notes (Signed)
Received patient in bed.Awake,alert and oriented x 4.Consent verified.  Medicine given: Midodrine 10 mg                           Oxycodone 10 mg.  Access used : Left upper arm AVF that worked well.  Duration of treatment: 3.5 hours.  Fluid removed : 3 liters.  Hemo comment:Tolerated treatment well.  Hand off to the patient's nurse.

## 2022-10-03 NOTE — Plan of Care (Signed)
Problem: Education: Goal: Understanding of CV disease, CV risk reduction, and recovery process will improve 10/03/2022 1537 by Herma Carson, RN Outcome: Adequate for Discharge 10/03/2022 1537 by Herma Carson, RN Outcome: Adequate for Discharge 10/03/2022 0813 by Herma Carson, RN Outcome: Progressing Goal: Individualized Educational Video(s) 10/03/2022 1537 by Herma Carson, RN Outcome: Adequate for Discharge 10/03/2022 1537 by Herma Carson, RN Outcome: Not Progressing 10/03/2022 0813 by Herma Carson, RN Outcome: Progressing   Problem: Activity: Goal: Ability to return to baseline activity level will improve 10/03/2022 1537 by Herma Carson, RN Outcome: Adequate for Discharge 10/03/2022 1537 by Herma Carson, RN Outcome: Not Progressing 10/03/2022 0813 by Herma Carson, RN Outcome: Progressing   Problem: Cardiovascular: Goal: Ability to achieve and maintain adequate cardiovascular perfusion will improve 10/03/2022 1537 by Herma Carson, RN Outcome: Adequate for Discharge 10/03/2022 1537 by Herma Carson, RN Outcome: Not Progressing 10/03/2022 0813 by Herma Carson, RN Outcome: Progressing Goal: Vascular access site(s) Level 0-1 will be maintained 10/03/2022 1537 by Herma Carson, RN Outcome: Adequate for Discharge 10/03/2022 1537 by Herma Carson, RN Outcome: Not Progressing 10/03/2022 0813 by Herma Carson, RN Outcome: Progressing   Problem: Health Behavior/Discharge Planning: Goal: Ability to safely manage health-related needs after discharge will improve 10/03/2022 1537 by Herma Carson, RN Outcome: Adequate for Discharge 10/03/2022 1537 by Herma Carson, RN Outcome: Not Progressing 10/03/2022 0813 by Herma Carson, RN Outcome: Progressing   Problem: Education: Goal: Knowledge of General Education information will improve Description: Including pain rating scale, medication(s)/side effects and non-pharmacologic comfort measures 10/03/2022  1537 by Herma Carson, RN Outcome: Adequate for Discharge 10/03/2022 1537 by Herma Carson, RN Outcome: Not Progressing 10/03/2022 0813 by Herma Carson, RN Outcome: Progressing   Problem: Health Behavior/Discharge Planning: Goal: Ability to manage health-related needs will improve 10/03/2022 1537 by Herma Carson, RN Outcome: Adequate for Discharge 10/03/2022 1537 by Herma Carson, RN Outcome: Not Progressing 10/03/2022 0813 by Herma Carson, RN Outcome: Progressing   Problem: Clinical Measurements: Goal: Ability to maintain clinical measurements within normal limits will improve 10/03/2022 1537 by Herma Carson, RN Outcome: Adequate for Discharge 10/03/2022 1537 by Herma Carson, RN Outcome: Not Progressing 10/03/2022 0813 by Herma Carson, RN Outcome: Progressing Goal: Will remain free from infection 10/03/2022 1537 by Herma Carson, RN Outcome: Adequate for Discharge 10/03/2022 1537 by Herma Carson, RN Outcome: Not Progressing 10/03/2022 0813 by Herma Carson, RN Outcome: Progressing Goal: Diagnostic test results will improve 10/03/2022 1537 by Herma Carson, RN Outcome: Adequate for Discharge 10/03/2022 1537 by Herma Carson, RN Outcome: Not Progressing 10/03/2022 0813 by Herma Carson, RN Outcome: Progressing Goal: Respiratory complications will improve 10/03/2022 1537 by Herma Carson, RN Outcome: Adequate for Discharge 10/03/2022 1537 by Herma Carson, RN Outcome: Not Progressing 10/03/2022 0813 by Herma Carson, RN Outcome: Progressing Goal: Cardiovascular complication will be avoided 10/03/2022 1537 by Herma Carson, RN Outcome: Adequate for Discharge 10/03/2022 1537 by Herma Carson, RN Outcome: Not Progressing 10/03/2022 0813 by Herma Carson, RN Outcome: Progressing   Problem: Activity: Goal: Risk for activity intolerance will decrease 10/03/2022 1537 by Herma Carson, RN Outcome: Adequate for Discharge 10/03/2022 1537 by  Herma Carson, RN Outcome: Not Progressing 10/03/2022 0813 by Herma Carson, RN Outcome: Progressing   Problem: Nutrition: Goal: Adequate nutrition will be maintained 10/03/2022 1537 by Everlean Cherry L,  RN Outcome: Adequate for Discharge 10/03/2022 1537 by Herma Carson, RN Outcome: Not Progressing 10/03/2022 0813 by Herma Carson, RN Outcome: Progressing   Problem: Coping: Goal: Level of anxiety will decrease 10/03/2022 1537 by Herma Carson, RN Outcome: Adequate for Discharge 10/03/2022 1537 by Herma Carson, RN Outcome: Not Progressing 10/03/2022 0813 by Herma Carson, RN Outcome: Progressing   Problem: Elimination: Goal: Will not experience complications related to bowel motility 10/03/2022 1537 by Herma Carson, RN Outcome: Adequate for Discharge 10/03/2022 1537 by Herma Carson, RN Outcome: Not Progressing 10/03/2022 0813 by Herma Carson, RN Outcome: Progressing Goal: Will not experience complications related to urinary retention 10/03/2022 1537 by Herma Carson, RN Outcome: Adequate for Discharge 10/03/2022 1537 by Herma Carson, RN Outcome: Not Progressing 10/03/2022 0813 by Herma Carson, RN Outcome: Progressing   Problem: Pain Managment: Goal: General experience of comfort will improve 10/03/2022 1537 by Herma Carson, RN Outcome: Adequate for Discharge 10/03/2022 1537 by Herma Carson, RN Outcome: Not Progressing 10/03/2022 0813 by Herma Carson, RN Outcome: Progressing   Problem: Safety: Goal: Ability to remain free from injury will improve 10/03/2022 1537 by Herma Carson, RN Outcome: Adequate for Discharge 10/03/2022 1537 by Herma Carson, RN Outcome: Not Progressing 10/03/2022 0813 by Herma Carson, RN Outcome: Progressing   Problem: Skin Integrity: Goal: Risk for impaired skin integrity will decrease 10/03/2022 1537 by Herma Carson, RN Outcome: Adequate for Discharge 10/03/2022 1537 by Herma Carson, RN Outcome: Not  Progressing 10/03/2022 0813 by Herma Carson, RN Outcome: Progressing

## 2022-10-03 NOTE — Plan of Care (Signed)

## 2022-10-03 NOTE — Progress Notes (Signed)
[facility] Progress Note   Subjective:   Patient seen in HD unit. Reports she is feeling better today, chest pain resolved, denies SOB. She reports her legs have felt weaker in general since her previous MI but she is now having leg pain too.   Objective Vitals:   10/03/22 0257 10/03/22 0447 10/03/22 0712 10/03/22 0815  BP:  (!) 144/63    Pulse: 64 65 65 65  Resp:  18    Temp:  97.8 F (36.6 C)  98.2 F (36.8 C)  TempSrc:  Oral    SpO2: 100% 100% 100% 100%  Weight:    77.4 kg  Height:       Physical Exam General: Alert female in NAD Heart: RRR, no murmurs, rubs or gallops Lungs: CTA bilaterally, on O2 via [place] Abdomen: Soft, non-distended, +BS Extremities: No edema b/l lower extremities Dialysis Access:  AVF accessed  Additional Objective Labs: Basic Metabolic Panel: Recent Labs  Lab 10/01/22 1136 10/01/22 2002 10/02/22 0700  NA 139 138 134*  K 4.5 4.1 4.6  CL 95* 93* 94*  CO2 31 26 26   GLUCOSE 90 109* 81  BUN 41* 42* 49*  CREATININE 8.63* 9.07* 9.79*  CALCIUM 8.8* 8.5* 7.9*  PHOS  --   --  5.1*   Liver Function Tests: Recent Labs  Lab 10/01/22 2002 10/02/22 0700  AST 14* 13*  ALT 16 14  ALKPHOS 98 91  BILITOT 0.5 0.7  PROT 7.6 6.9  ALBUMIN 3.2* 2.9*   No results for input(s): "LIPASE", "AMYLASE" in the last 168 hours. CBC: Recent Labs  Lab 10/01/22 1136 10/02/22 0700 10/03/22 0354  WBC 5.8 4.9 5.5  HGB 10.0* 9.5* 9.2*  HCT 32.0* 30.4* 30.0*  MCV 84.7 84.0 84.7  PLT 153 129* 156   Blood Culture    Component Value Date/Time   SDES BLOOD BLOOD RIGHT HAND 10/01/2022 2146   SDES BLOOD BLOOD RIGHT HAND 10/01/2022 2146   SPECREQUEST  10/01/2022 2146    BOTTLES DRAWN AEROBIC AND ANAEROBIC Blood Culture adequate volume   SPECREQUEST  10/01/2022 2146    BOTTLES DRAWN AEROBIC AND ANAEROBIC Blood Culture adequate volume   CULT  10/01/2022 2146    NO GROWTH 2 DAYS Performed at [facility], [address], [place], [place]  [address]    CULT  10/01/2022 2146    NO GROWTH 2 DAYS Performed at [facility], [address], [place], [place] [address]    REPTSTATUS PENDING 10/01/2022 2146   REPTSTATUS PENDING 10/01/2022 2146    Cardiac Enzymes: No results for input(s): "CKTOTAL", "CKMB", "CKMBINDEX", "TROPONINI" in the last 168 hours. CBG: No results for input(s): "GLUCAP" in the last 168 hours. Iron Studies:  Recent Labs    10/02/22 0700  IRON 67  TIBC 217*  FERRITIN 174   @lablastinr3 @ Studies/Results: ECHOCARDIOGRAM LIMITED  Result Date: 10/02/2022    ECHOCARDIOGRAM LIMITED REPORT   Patient Name:   Desiree [name] Date of Exam: 10/02/2022 Medical Rec #:  [id number]       Height:       64.0 in Accession #:    [id number]      Weight:       193.3 lb Date of Birth:  Apr 11, 1975        BSA:          1.928 m Patient Age:    47 years        BP:  112/65 mmHg Patient Gender: F               HR:           58 bpm. Exam Location:  Inpatient Procedure: Limited Echo, Cardiac Doppler, Limited Color Doppler, Strain Analysis            and 3D Echo Indications:    Chest pain  History:        Patient has prior history of Echocardiogram examinations, most                 recent 09/13/2022. CHF, Arrythmias:Atrial Fibrillation; Risk                 Factors:Hypertension and Non-Smoker.  Sonographer:    Dondra Prader RVT RCS Referring Phys: [id number] Corrin Parker  Sonographer Comments: Global longitudinal strain was attempted. IMPRESSIONS  1. Left ventricular ejection fraction, by estimation, is 45 to 50%. The left ventricle has mildly decreased function. The left ventricle demonstrates regional wall motion abnormalities with akinesis of the apical lateral wall and true apex.  2. Left atrial size was mildly dilated.  3. Right ventricular systolic function is normal. The right ventricular size is normal.  4. Moderate mitral annular calcification.  5. The aortic valve is tricuspid. There is mild calcification of the aortic valve.   6. The inferior vena cava is dilated in size with >50% respiratory variability, suggesting right atrial pressure of 8 mmHg.  7. Moderate pericardial effusion. The pericardial effusion is circumferential. There is no evidence of cardiac tamponade.  8. Limited echo FINDINGS  Left Ventricle: Left ventricular ejection fraction, by estimation, is 45 to 50%. The left ventricle has mildly decreased function. The left ventricle demonstrates regional wall motion abnormalities. The left ventricular internal cavity size was normal in size. There is no left ventricular hypertrophy. Right Ventricle: The right ventricular size is normal. Right ventricular systolic function is normal. Left Atrium: Left atrial size was mildly dilated. Right Atrium: Right atrial size was normal in size. Pericardium: A moderately sized pericardial effusion is present. The pericardial effusion is circumferential. There is no evidence of cardiac tamponade. Mitral Valve: Moderate mitral annular calcification. Aortic Valve: The aortic valve is tricuspid. There is mild calcification of the aortic valve. Venous: The inferior vena cava is dilated in size with greater than 50% respiratory variability, suggesting right atrial pressure of 8 mmHg. LEFT VENTRICLE PLAX 2D LVIDd:         5.00 cm LVIDs:         3.40 cm LV PW:         1.10 cm LV IVS:        0.90 cm                             3D Volume EF:                             3D EF:        57 % LV Volumes (MOD)            LV EDV:       159 ml LV vol d, MOD A2C: 130.0 ml LV ESV:       69 ml LV vol d, MOD A4C: 121.0 ml LV SV:        90 ml LV vol s, MOD A2C: 51.1 ml LV vol s, MOD A4C:  43.0 ml LV SV MOD A2C:     78.9 ml LV SV MOD A4C:     121.0 ml LV SV MOD BP:      85.6 ml LEFT ATRIUM         Index LA diam:    3.80 cm 1.97 cm/m Dalton McleanMD Electronically signed by Wilfred Lacy Signature Date/Time: 10/02/2022/4:07:01 PM    Final    DG Chest Portable 1 View  Result Date: 10/01/2022 CLINICAL DATA:   Chest pain. EXAM: PORTABLE CHEST 1 VIEW COMPARISON:  September 12, 2022. FINDINGS: Mild cardiomegaly is noted with mild central pulmonary vascular congestion. No consolidative process is noted. Bony thorax is unremarkable. IMPRESSION: Mild cardiomegaly with mild central pulmonary vascular congestion. Electronically Signed   By: Lupita Raider M.D.   On: 10/01/2022 12:03   Medications:  sodium chloride      ALPRAZolam  1 mg Oral TID   apixaban  5 mg Oral BID   atorvastatin  80 mg Oral Daily   Chlorhexidine Gluconate Cloth  6 each Topical Q0600   Chlorhexidine Gluconate Cloth  6 each Topical Q0600   cinacalcet  60 mg Oral Q supper   clopidogrel  75 mg Oral Q breakfast   fluticasone furoate-vilanterol  1 puff Inhalation Daily   pantoprazole  40 mg Oral Daily   sevelamer carbonate  1,600 mg Oral TID WC   sodium chloride flush  3 mL Intravenous Q12H    Outpatient Dialysis Orders: Center: [facility]  on TTS . 180NRe 3 hr 45 min BFR 450 DFR 500 EDW 83.5kg 2K 2Ca AVF 15g Heparin 4000 unit bolus Mircera IV q 2 weeks- last dose 09/22/22 Hectorol q HD Sensipar 30mg  PO q HD  Assessment/Plan:  Chest pain: Recent STEMI in 08/2022. Cardiology r/o ACS, stopped heparin drip. 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 Volume overload may have been contributing to her chest pain.   ESRD:  Attends HD on TTS schedule, off schedule Friday d/t missed HD. Back on TTS schedule.   Hypertension/volume: Mild pulmonary congestion on CXR, improved post HD yesterday. Weights variable, will need standing weight when able. Continue midodrine for BP support.   Anemia: Hgb 9-10's, on max ESA, not due for next dose yet.   Metabolic bone disease: Cakcium controlled. Continue hectorol, resume sensipar once tolerating PO well. Phos at goal.   Nutrition:  Continue renal diet/protein supplements Anxiety: Reports frequent panic attacks, worse since her MI last month. On  xanax.   Rogers Blocker, PA-C 10/03/2022, 8:22 AM  [facility] Pager: [phone]

## 2022-10-04 NOTE — Discharge Planning (Signed)
[facility] Patient Discharge Orders- [facility] CLINIC: [facility]  Patient's name: Desiree [name] Admit/DC Dates: 10/01/2022 - 10/03/2022  Discharge Diagnoses: Chest pain, ruled out ACS   Acute on chronic HFrEF  Aranesp: Given: No   Date and amount of last dose: N/A  Last Hgb: 9.2 PRBC's Given: No Date/# of units: N/A ESA dose for discharge: mircera 225 mcg IV q 2 weeks  IV Iron dose at discharge: None  Heparin change: No  EDW Change: Yes New EDW: 74.4kg  [place] Change: No  Access intervention/Change: No Details:  Hectorol/Calcitriol change: No  Discharge Labs: Calcium 8.5 Phosphorus 4.7 Albumin 2.9 K+ 4.2  IV Antibiotics: No Details:  On Coumadin?: No Last INR: Next INR: Managed By:   OTHER/APPTS/LAB ORDERS:    D/C Meds to be reconciled by nurse after every discharge.  Completed By: Rogers Blocker, PA-C 10/04/2022, 10:49 AM  [facility] Pager: [phone]    Reviewed by: MD:______ RN_______

## 2022-10-05 LAB — CULTURE, BLOOD (ROUTINE X 2): Special Requests: ADEQUATE

## 2022-10-06 LAB — CULTURE, BLOOD (ROUTINE X 2): Culture: NO GROWTH

## 2022-10-09 ENCOUNTER — Telehealth: Payer: Self-pay | Admitting: Cardiology

## 2022-10-09 NOTE — Telephone Encounter (Signed)
Notes printed, will be faxed by end of business day

## 2022-10-09 NOTE — Telephone Encounter (Signed)
Office calling to request office visit notes from 7/15. Please advise

## 2022-10-29 ENCOUNTER — Telehealth (HOSPITAL_COMMUNITY): Payer: Self-pay

## 2022-10-29 NOTE — Telephone Encounter (Signed)
Called pt about referral. Tried to schedule cardiac rehab orientation. LM on VM. Closing referral for now!

## 2022-10-30 ENCOUNTER — Non-Acute Institutional Stay (HOSPITAL_COMMUNITY)
Admission: RE | Admit: 2022-10-30 | Discharge: 2022-10-30 | Disposition: A | Discharge status: Home / Self Care | Payer: 59 | Source: Ambulatory Visit | Attending: Internal Medicine | Admitting: Internal Medicine

## 2022-10-30 VITALS — BP 100/46 | HR 75 | Temp 98.4°F | Resp 16

## 2022-10-30 DIAGNOSIS — D649 Anemia, unspecified: Secondary | ICD-10-CM | POA: Diagnosis not present

## 2022-10-30 LAB — PREPARE RBC (CROSSMATCH)

## 2022-10-30 MED ORDER — SODIUM CHLORIDE 0.9% IV SOLUTION: Freq: Once | INTRAVENOUS | Status: AC

## 2022-10-30 NOTE — Progress Notes (Signed)
PATIENT CARE CENTER NOTE   Diagnosis: Anemia, unspecified D64.9   Provider: Annie Sable, MD   Procedure: 1 unit blood transfusion    Note: Type & Screen drawn. Patient received 1 unit PRBC's via PIV. No pre-medications ordered. Patient tolerated transfusion well with no adverse reaction. Vital signs stable. Discharge instructions given. Patient alert, oriented and ambulatory with walker at discharge.

## 2022-10-31 LAB — TYPE AND SCREEN
ABO/RH(D): B NEG
Antibody Screen: NEGATIVE
Unit division: 0

## 2022-10-31 LAB — BPAM RBC
Blood Product Expiration Date: 202409072359
ISSUE DATE / TIME: 202408161042
Unit Type and Rh: 1700

## 2022-11-03 ENCOUNTER — Encounter (HOSPITAL_COMMUNITY): Payer: Self-pay | Admitting: Cardiology

## 2022-11-03 ENCOUNTER — Inpatient Hospital Stay (HOSPITAL_COMMUNITY)
Admission: EM | Admit: 2022-11-03 | Discharge: 2022-11-05 | DRG: 308 | Disposition: A | Discharge status: Home / Self Care | Payer: 59 | Attending: Internal Medicine | Admitting: Internal Medicine

## 2022-11-03 ENCOUNTER — Emergency Department (HOSPITAL_COMMUNITY): Payer: 59

## 2022-11-03 ENCOUNTER — Other Ambulatory Visit: Payer: Self-pay

## 2022-11-03 DIAGNOSIS — I480 Paroxysmal atrial fibrillation: Secondary | ICD-10-CM | POA: Diagnosis not present

## 2022-11-03 DIAGNOSIS — Z885 Allergy status to narcotic agent status: Secondary | ICD-10-CM

## 2022-11-03 DIAGNOSIS — J9611 Chronic respiratory failure with hypoxia: Secondary | ICD-10-CM | POA: Diagnosis not present

## 2022-11-03 DIAGNOSIS — Z91013 Allergy to seafood: Secondary | ICD-10-CM

## 2022-11-03 DIAGNOSIS — I4892 Unspecified atrial flutter: Secondary | ICD-10-CM | POA: Diagnosis present

## 2022-11-03 DIAGNOSIS — M898X9 Other specified disorders of bone, unspecified site: Secondary | ICD-10-CM | POA: Diagnosis present

## 2022-11-03 DIAGNOSIS — Z87891 Personal history of nicotine dependence: Secondary | ICD-10-CM

## 2022-11-03 DIAGNOSIS — I4891 Unspecified atrial fibrillation: Secondary | ICD-10-CM | POA: Diagnosis not present

## 2022-11-03 DIAGNOSIS — N185 Chronic kidney disease, stage 5: Secondary | ICD-10-CM | POA: Diagnosis not present

## 2022-11-03 DIAGNOSIS — K219 Gastro-esophageal reflux disease without esophagitis: Secondary | ICD-10-CM | POA: Diagnosis present

## 2022-11-03 DIAGNOSIS — Z833 Family history of diabetes mellitus: Secondary | ICD-10-CM

## 2022-11-03 DIAGNOSIS — Z7901 Long term (current) use of anticoagulants: Secondary | ICD-10-CM

## 2022-11-03 DIAGNOSIS — N186 End stage renal disease: Secondary | ICD-10-CM | POA: Diagnosis present

## 2022-11-03 DIAGNOSIS — Z9981 Dependence on supplemental oxygen: Secondary | ICD-10-CM

## 2022-11-03 DIAGNOSIS — Z91041 Radiographic dye allergy status: Secondary | ICD-10-CM

## 2022-11-03 DIAGNOSIS — Z91048 Other nonmedicinal substance allergy status: Secondary | ICD-10-CM

## 2022-11-03 DIAGNOSIS — M109 Gout, unspecified: Secondary | ICD-10-CM | POA: Diagnosis present

## 2022-11-03 DIAGNOSIS — Z79899 Other long term (current) drug therapy: Secondary | ICD-10-CM

## 2022-11-03 DIAGNOSIS — I252 Old myocardial infarction: Secondary | ICD-10-CM

## 2022-11-03 DIAGNOSIS — I9589 Other hypotension: Secondary | ICD-10-CM | POA: Diagnosis present

## 2022-11-03 DIAGNOSIS — Z888 Allergy status to other drugs, medicaments and biological substances status: Secondary | ICD-10-CM

## 2022-11-03 DIAGNOSIS — I2510 Atherosclerotic heart disease of native coronary artery without angina pectoris: Secondary | ICD-10-CM | POA: Diagnosis present

## 2022-11-03 DIAGNOSIS — I5022 Chronic systolic (congestive) heart failure: Secondary | ICD-10-CM | POA: Diagnosis present

## 2022-11-03 DIAGNOSIS — E785 Hyperlipidemia, unspecified: Secondary | ICD-10-CM | POA: Diagnosis present

## 2022-11-03 DIAGNOSIS — I953 Hypotension of hemodialysis: Secondary | ICD-10-CM | POA: Diagnosis present

## 2022-11-03 DIAGNOSIS — I3139 Other pericardial effusion (noninflammatory): Secondary | ICD-10-CM | POA: Diagnosis present

## 2022-11-03 DIAGNOSIS — Z992 Dependence on renal dialysis: Secondary | ICD-10-CM

## 2022-11-03 DIAGNOSIS — D631 Anemia in chronic kidney disease: Secondary | ICD-10-CM | POA: Diagnosis present

## 2022-11-03 DIAGNOSIS — Z7902 Long term (current) use of antithrombotics/antiplatelets: Secondary | ICD-10-CM

## 2022-11-03 DIAGNOSIS — Z8249 Family history of ischemic heart disease and other diseases of the circulatory system: Secondary | ICD-10-CM

## 2022-11-03 DIAGNOSIS — Z83438 Family history of other disorder of lipoprotein metabolism and other lipidemia: Secondary | ICD-10-CM

## 2022-11-03 DIAGNOSIS — I132 Hypertensive heart and chronic kidney disease with heart failure and with stage 5 chronic kidney disease, or end stage renal disease: Secondary | ICD-10-CM | POA: Diagnosis present

## 2022-11-03 LAB — COMPREHENSIVE METABOLIC PANEL
ALT: 11 U/L (ref 0–44)
AST: 14 U/L — ABNORMAL LOW (ref 15–41)
Albumin: 2.9 g/dL — ABNORMAL LOW (ref 3.5–5.0)
Alkaline Phosphatase: 55 U/L (ref 38–126)
Anion gap: 13 (ref 5–15)
BUN: 13 mg/dL (ref 6–20)
CO2: 31 mmol/L (ref 22–32)
Calcium: 9.8 mg/dL (ref 8.9–10.3)
Chloride: 93 mmol/L — ABNORMAL LOW (ref 98–111)
Creatinine, Ser: 5.38 mg/dL — ABNORMAL HIGH (ref 0.44–1.00)
GFR, Estimated: 9 mL/min — ABNORMAL LOW (ref >60)
Glucose, Bld: 82 mg/dL (ref 70–99)
Potassium: 3.6 mmol/L (ref 3.5–5.1)
Sodium: 137 mmol/L (ref 135–145)
Total Bilirubin: 0.5 mg/dL (ref 0.3–1.2)
Total Protein: 7.7 g/dL (ref 6.5–8.1)

## 2022-11-03 LAB — MAGNESIUM: Magnesium: 2.2 mg/dL (ref 1.7–2.4)

## 2022-11-03 LAB — TSH: TSH: 2.455 u[IU]/mL (ref 0.350–4.500)

## 2022-11-03 LAB — CBC
HCT: 32.1 % — ABNORMAL LOW (ref 36.0–46.0)
Hemoglobin: 9.8 g/dL — ABNORMAL LOW (ref 12.0–15.0)
MCH: 27.3 pg (ref 26.0–34.0)
MCHC: 30.5 g/dL (ref 30.0–36.0)
MCV: 89.4 fL (ref 80.0–100.0)
Platelets: 255 10*3/uL (ref 150–400)
RBC: 3.59 MIL/uL — ABNORMAL LOW (ref 3.87–5.11)
RDW: 19.3 % — ABNORMAL HIGH (ref 11.5–15.5)
WBC: 5.7 10*3/uL (ref 4.0–10.5)
nRBC: 0 % (ref 0.0–0.2)

## 2022-11-03 LAB — PHOSPHORUS: Phosphorus: 3.3 mg/dL (ref 2.5–4.6)

## 2022-11-03 LAB — TROPONIN I (HIGH SENSITIVITY): Troponin I (High Sensitivity): 31 ng/L — ABNORMAL HIGH (ref <18)

## 2022-11-03 MED ORDER — ACETAMINOPHEN 325 MG PO TABS: 650.0000 mg | ORAL_TABLET | Freq: Four times a day (QID) | ORAL | Status: DC | PRN

## 2022-11-03 MED ORDER — ATORVASTATIN CALCIUM 40 MG PO TABS: 80.0000 mg | ORAL_TABLET | Freq: Every day | ORAL | Status: DC

## 2022-11-03 MED ORDER — CLOPIDOGREL BISULFATE 75 MG PO TABS
75.0000 mg | ORAL_TABLET | Freq: Every day | ORAL | Status: DC
  Administered 2022-11-04 – 2022-11-05 (×2): 75 mg via ORAL
  Filled 2022-11-03 (×2): qty 1

## 2022-11-03 MED ORDER — AMIODARONE HCL IN DEXTROSE 360-4.14 MG/200ML-% IV SOLN
30.0000 mg/h | INTRAVENOUS | Status: DC
  Administered 2022-11-04 (×2): 30 mg/h via INTRAVENOUS
  Filled 2022-11-03: qty 200

## 2022-11-03 MED ORDER — PANTOPRAZOLE SODIUM 40 MG PO TBEC: 40.0000 mg | DELAYED_RELEASE_TABLET | Freq: Every day | ORAL | Status: DC

## 2022-11-03 MED ORDER — ALPRAZOLAM 0.5 MG PO TABS
0.5000 mg | ORAL_TABLET | Freq: Three times a day (TID) | ORAL | Status: DC | PRN
  Administered 2022-11-03 – 2022-11-05 (×4): 0.5 mg via ORAL
  Filled 2022-11-03: qty 1
  Filled 2022-11-03: qty 2
  Filled 2022-11-03: qty 1
  Filled 2022-11-03: qty 2

## 2022-11-03 MED ORDER — ONDANSETRON HCL 4 MG/2ML IJ SOLN
4.0000 mg | Freq: Once | INTRAMUSCULAR | Status: AC
  Administered 2022-11-03: 4 mg via INTRAVENOUS
  Filled 2022-11-03: qty 2

## 2022-11-03 MED ORDER — SODIUM CHLORIDE 0.9 % IV BOLUS
500.0000 mL | Freq: Once | INTRAVENOUS | Status: AC
  Administered 2022-11-03: 500 mL via INTRAVENOUS

## 2022-11-03 MED ORDER — SEVELAMER CARBONATE 800 MG PO TABS
1600.0000 mg | ORAL_TABLET | Freq: Three times a day (TID) | ORAL | Status: DC
  Administered 2022-11-03 – 2022-11-05 (×4): 1600 mg via ORAL
  Filled 2022-11-03 (×5): qty 2

## 2022-11-03 MED ORDER — FLUTICASONE FUROATE-VILANTEROL 100-25 MCG/ACT IN AEPB
1.0000 | INHALATION_SPRAY | Freq: Every day | RESPIRATORY_TRACT | Status: DC
  Administered 2022-11-03: 1 via RESPIRATORY_TRACT
  Filled 2022-11-03: qty 28

## 2022-11-03 MED ORDER — MIDODRINE HCL 5 MG PO TABS
10.0000 mg | ORAL_TABLET | Freq: Three times a day (TID) | ORAL | Status: DC
  Administered 2022-11-03 – 2022-11-05 (×5): 10 mg via ORAL
  Filled 2022-11-03 (×5): qty 2

## 2022-11-03 MED ORDER — METOPROLOL TARTRATE 25 MG PO TABS
12.5000 mg | ORAL_TABLET | Freq: Once | ORAL | Status: DC
  Filled 2022-11-03: qty 1

## 2022-11-03 MED ORDER — APIXABAN 5 MG PO TABS
5.0000 mg | ORAL_TABLET | Freq: Two times a day (BID) | ORAL | Status: DC
  Administered 2022-11-03 – 2022-11-05 (×4): 5 mg via ORAL
  Filled 2022-11-03 (×4): qty 1

## 2022-11-03 MED ORDER — POTASSIUM CHLORIDE CRYS ER 20 MEQ PO TBCR
40.0000 meq | EXTENDED_RELEASE_TABLET | Freq: Once | ORAL | Status: AC
  Administered 2022-11-03: 40 meq via ORAL
  Filled 2022-11-03: qty 2

## 2022-11-03 MED ORDER — PANTOPRAZOLE SODIUM 40 MG PO TBEC
40.0000 mg | DELAYED_RELEASE_TABLET | Freq: Every day | ORAL | Status: DC
  Administered 2022-11-04 – 2022-11-05 (×2): 40 mg via ORAL
  Filled 2022-11-03 (×2): qty 1

## 2022-11-03 MED ORDER — MELATONIN 5 MG PO TABS
10.0000 mg | ORAL_TABLET | Freq: Every evening | ORAL | Status: DC | PRN
  Administered 2022-11-04: 10 mg via ORAL
  Filled 2022-11-03: qty 2

## 2022-11-03 MED ORDER — ATORVASTATIN CALCIUM 80 MG PO TABS
80.0000 mg | ORAL_TABLET | Freq: Every day | ORAL | Status: DC
  Administered 2022-11-04 – 2022-11-05 (×2): 80 mg via ORAL
  Filled 2022-11-03: qty 2
  Filled 2022-11-03: qty 1

## 2022-11-03 MED ORDER — AMIODARONE HCL IN DEXTROSE 360-4.14 MG/200ML-% IV SOLN
60.0000 mg/h | INTRAVENOUS | Status: AC
  Administered 2022-11-03: 60 mg/h via INTRAVENOUS
  Filled 2022-11-03: qty 200

## 2022-11-03 MED ORDER — ACETAMINOPHEN 650 MG RE SUPP: 650.0000 mg | Freq: Four times a day (QID) | RECTAL | Status: DC | PRN

## 2022-11-03 NOTE — Assessment & Plan Note (Signed)
Chronic. 

## 2022-11-03 NOTE — Assessment & Plan Note (Signed)
Chronic. Has moderate pericardial effusion. Seen on echo 09-2022 and 08-2022

## 2022-11-03 NOTE — Consult Note (Signed)
Cardiology Consultation   Patient ID: [name] MRN: [id number]; DOB: May 16, 1975  Admit date: 11/03/2022 Date of Consult: 11/03/2022  PCP:  Jearld Lesch, MD   [facility] Providers Cardiologist:  Jodelle Red, MD        Patient Profile:   Desiree [name] is a 47 y.o. female with a hx of CAD s/p anterior STEMI with thrombus,  PTCA to 2 vessels, HD T/TH/S HTN, seizures, migraine, gout anemia, anxiety, childhood asthma, GERD, chronic, respiratory failure on 3 L, home 02, was noted to have asymptomatic a fib 09/14/22  and anticoagulation added who is being seen 11/03/2022 for the evaluation of recurrent atrial fib at the request of Dr Silverio Lay.  History of Present Illness:   Desiree [name] with above hx and in July 1 this year EF 35-40% + RWMA, mild MR, + mobile echodensity, TR mod to severe. She had acute MI with thromboembolic event to her cors, felt to be due to thrombus from undetected atrial fib.   On cath she had occlusions of distal OM 3 and the apical LAD. Angioplasty was done in both areas but it was difficult to restore significant flow plan for medical management.  Placed on Eliquis and plavix and statin.  Difficult to manage on GDMT due to soft BP.  She needs midodrine for hypotension.  For atrial fib not a candidate for rhythm modification with thromboembolic event so planned for rate control and eliquis on BB.   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   Admitted 10/01/22 with chest pain and troponin in 138.  EKG SR with deep T wave inversions in lateral leads with admit for chest pain and had missed HD with that admit her EF was 45-50% with regional WMA, mod pericardial effusion without tamponade.  Per Dr. [place] no role for repeat invasive eval in her setting. Continue antianginal therapy.    Today pt had dialysis and and was seen in her PCP office and was found to be in a fib.  She was in SR on July 15 on OV.  She had been in a fib on discharge 09/17/22.      EKG:  The EKG was personally reviewed and demonstrates:  atrial fib RVR at 115 no acute ST changes prolonged Qtc 537 ms   Telemetry:  Telemetry was personally reviewed and demonstrates:  atrial fib RVR in 110 range  She was reporting associated sx of weakness,SOB, and HR fast in PCP.    Currently she feels fine, no awareness of atrial fib. Has not missed any eliquis.  No chest pain.  No SOB she did miss one HD last week.  She does complain about leg pain no hx of ABIs of lower ext.  Will order.  BP in 80s.   HS troponin 31    CMP pending  Hgb 9.8 WBC 5.7 Plts 255   PCXR  IMPRESSION: 1. Cardiomegaly with mild congestive heart failure, mildly improved. 2. Possible mild right mid and lower lung zone pneumonia.  BP 92/63 to 81/58  R 18 afebrile   Past Medical History:  Diagnosis Date   Anemia    Anxiety    panic attacks   Arthritis    bilateral knees   Childhood asthma    Complication of anesthesia    "sometimes it does not work; didn't during LEEP OR" (01/21/2016)   Depression    no med   ESRD (end stage renal disease) on dialysis (HCC)    "  TTS; [facility]; [facility]" (08/03/2016)   GERD (gastroesophageal reflux disease)    nexium prn   Gout    History of blood transfusion    "related to kidneys; I've had 4" (01/21/2016)   Hypertension    Migraine    last one 01/18/19   Preterm labor ~ 2014   Retina hole, left    Seizures (HCC)    "last one was in 2000; related to preeclampsia" (01/21/2016)    Past Surgical History:  Procedure Laterality Date   A/V FISTULAGRAM Left 12/19/2021   Procedure: A/V Fistulagram;  Surgeon: Chuck Hint, MD;  Location: [facility] INVASIVE CV LAB;  Service: Cardiovascular;  Laterality: Left;   AV FISTULA PLACEMENT Left 2010   AV FISTULA PLACEMENT Left 01/20/2019   Procedure: ARTERIOVENOUS (AV) FISTULA CREATION LEFT UPPER ARM;  Surgeon: Chuck Hint, MD;  Location: [facility] OR;  Service: Vascular;  Laterality: Left;   AV FISTULA  PLACEMENT Left 12/19/2021   Procedure: ARTERIOVENOUS (AV) FISTULA CREATION POSSIBLE TUNNELED DIALYSIS;  Surgeon: Maeola Harman, MD;  Location: [facility] OR;  Service: Vascular;  Laterality: Left;   CERVICAL BIOPSY  W/ LOOP ELECTRODE EXCISION  2001   CORONARY/GRAFT ACUTE MI REVASCULARIZATION N/A 09/12/2022   Procedure: Coronary/Graft Acute MI Revascularization;  Surgeon: Corky Crafts, MD;  Location: [facility] INVASIVE CV LAB;  Service: Cardiovascular;  Laterality: N/A;   DILATION AND EVACUATION  08/02/2011   Procedure: DILATATION AND EVACUATION;  Surgeon: Oliver Pila, MD;  Location: [facility] ORS;  Service: Gynecology;;   DILATION AND EVACUATION N/A 08/31/2013   Procedure: DILATATION AND EVACUATION;  Surgeon: Adam Phenix, MD;  Location: [facility] ORS;  Service: Gynecology;  Laterality: N/A;   FISTULA SUPERFICIALIZATION Left 01/20/2019   Procedure: Fistula Superficialization;  Surgeon: Chuck Hint, MD;  Location: [facility] OR;  Service: Vascular;  Laterality: Left;   HYDRADENITIS EXCISION Right    INSERTION OF DIALYSIS CATHETER Right 12/19/2021   Procedure: INSERTION OF A TUNNELED DIALYSIS CATHETER;  Surgeon: Maeola Harman, MD;  Location: [facility] OR;  Service: Vascular;  Laterality: Right;   LEFT HEART CATH AND CORONARY ANGIOGRAPHY N/A 09/12/2022   Procedure: LEFT HEART CATH AND CORONARY ANGIOGRAPHY;  Surgeon: Corky Crafts, MD;  Location: [facility] INVASIVE CV LAB;  Service: Cardiovascular;  Laterality: N/A;   RENAL BIOPSY     REVISION OF ARTERIOVENOUS GORETEX GRAFT Left 02/11/2013   Procedure: REVISION OF ARTERIOVENOUS GORTEX FISTULA;  Surgeon: Larina Earthly, MD;  Location: [facility] OR;  Service: Vascular;  Laterality: Left;   THROMBECTOMY W/ EMBOLECTOMY Left 05/25/2018   Procedure: REPAIR OF BLEEDING ARTERIOVENOUS FISTULA;  Surgeon: Nada Libman, MD;  Location: [facility] OR;  Service: Vascular;  Laterality: Left;     Home Medications:  Prior to Admission medications   Medication Sig Start Date  End Date Taking? Authorizing Provider  acetaminophen (TYLENOL) 325 MG tablet Take 325 mg by mouth every 6 (six) hours as needed for mild pain.   Yes [provider]  albuterol (PROVENTIL HFA;VENTOLIN HFA) 108 (90 Base) MCG/ACT inhaler Inhale 2 puffs into the lungs every 4 (four) hours as needed for wheezing or shortness of breath. 05/02/16  Yes Lavera Guise, MD  ALPRAZolam Prudy Feeler) 1 MG tablet Take 1 mg by mouth 3 (three) times daily. 11/25/21  Yes [provider]  apixaban (ELIQUIS) 5 MG TABS tablet Take 1 tablet (5 mg total) by mouth 2 (two) times daily. 09/28/22  Yes Alver Sorrow, NP  atorvastatin (LIPITOR) 80 MG tablet Take  1 tablet (80 mg total) by mouth daily. 09/28/22  Yes Alver Sorrow, NP  clopidogrel (PLAVIX) 75 MG tablet Take 1 tablet (75 mg total) by mouth daily with breakfast. 09/17/22  Yes Jonita Albee, PA-C  cyclobenzaprine (FLEXERIL) 10 MG tablet Take 10 mg by mouth 2 (two) times daily as needed for muscle spasms.   Yes [provider]  ferric citrate (AURYXIA) 1 GM 210 MG(Fe) tablet Take 1 tablet (210 mg total) by mouth 3 (three) times daily with meals. 09/17/22  Yes Corky Crafts, MD  fluticasone furoate-vilanterol (BREO ELLIPTA) 100-25 MCG/ACT AEPB Inhale 1 puff into the lungs daily. 04/30/22  Yes Oretha Milch, MD  folic acid-vitamin b complex-vitamin c-selenium-zinc (DIALYVITE) 3 MG TABS tablet Take 1 tablet by mouth daily.   Yes [provider]  isosorbide mononitrate (IMDUR) 30 MG 24 hr tablet Take a half tablet in the morning on non-dialysis days. Patient taking differently: Take 15 mg by mouth 2 (two) times daily. 09/28/22  Yes Alver Sorrow, NP  lactulose (CHRONULAC) 10 GM/15ML solution Take 30 mLs by mouth daily as needed for mild constipation, moderate constipation or severe constipation. 09/04/21  Yes [provider]  loperamide (IMODIUM) 2 MG capsule Take 2 mg by mouth as needed for diarrhea or loose stools.    Yes [provider]  midodrine (PROAMATINE) 10 MG tablet Take 10 mg by mouth 3 (three) times daily as needed (Hypotension). 09/03/22  Yes [provider]  NARCAN 4 MG/0.1ML LIQD nasal spray kit Place 1 spray into the nose as needed (accidental overdose). 11/14/18  Yes [provider]  ondansetron (ZOFRAN-ODT) 8 MG disintegrating tablet Take 8 mg by mouth 2 (two) times daily.   Yes [provider]  Oxycodone HCl 10 MG TABS Take 1 tablet (10 mg total) by mouth 3 (three) times daily as needed (severe pain). Patient taking differently: Take 10 mg by mouth 3 (three) times daily as needed (For pain). 12/22/21  Yes Arrien, York Ram, MD  pantoprazole (PROTONIX) 40 MG tablet Take 1 tablet (40 mg total) by mouth daily. 09/28/22 03/27/23 Yes Alver Sorrow, NP  sevelamer carbonate (RENVELA) 800 MG tablet Take 1,600 mg by mouth 3 (three) times daily. 05/18/19  Yes [provider]  cinacalcet (SENSIPAR) 30 MG tablet Take 2 tablets (60 mg total) by mouth daily with supper. Patient not taking: Reported on 11/03/2022 09/17/22   Corky Crafts, MD  metoprolol tartrate (LOPRESSOR) 25 MG tablet Take 0.5 tablets (12.5 mg total) by mouth 2 (two) times daily. Patient not taking: Reported on 11/03/2022 09/28/22   Alver Sorrow, NP    Inpatient Medications: Scheduled Meds:  metoprolol tartrate  12.5 mg Oral Once   midodrine  10 mg Oral TID WC   Continuous Infusions:  PRN Meds:   Allergies:    Allergies  Allergen Reactions   Morphine Shortness Of Breath and Anaphylaxis   Prednisone Other (See Comments)    Other reaction(s): Other (See Comments) Muscle spasms Patient says prednisone causes her to cramp all over, muscle spasms uncontrolled   Tuna [Fish Allergy] Itching, Swelling, Rash and Other (See Comments)    Face droops also   Amlodipine Other (See Comments)    Angioedema (09/05/17 ED visit)   Iodinated Contrast Media Itching   Tape Itching     Adhesive tape   paper tape ok    Social History:   Social History   Socioeconomic History   Marital status: Media planner  Spouse name: Not on file   Number of children: Not on file   Years of education: Not on file   Highest education level: Not on file  Occupational History   Not on file  Tobacco Use   Smoking status: Never    Passive exposure: Yes   Smokeless tobacco: Never   Tobacco comments:    Husband and daughter Smokes on patio  Vaping Use   Vaping status: Never Used  Substance and Sexual Activity   Alcohol use: No   Drug use: Yes    Types: Marijuana    Comment: not recent   Sexual activity: Yes    Birth control/protection: None  Other Topics Concern   Not on file  Social History Narrative   Not on file   Social Determinants of Health   Financial Resource Strain: Not on file  Food Insecurity: No Food Insecurity (10/02/2022)   Hunger Vital Sign    Worried About Running Out of Food in the Last Year: Never true    Ran Out of Food in the Last Year: Never true  Transportation Needs: No Transportation Needs (10/02/2022)   PRAPARE - Administrator, Civil Service (Medical): No    Lack of Transportation (Non-Medical): No  Physical Activity: Not on file  Stress: Not on file  Social Connections: Not on file  Intimate Partner Violence: Not At Risk (10/02/2022)   Humiliation, Afraid, Rape, and Kick questionnaire    Fear of Current or Ex-Partner: No    Emotionally Abused: No    Physically Abused: No    Sexually Abused: No    Family History:    Family History  Problem Relation Age of Onset   Diabetes Mother    Hyperlipidemia Mother    Hypertension Mother    Heart disease Mother    Hypertension Other    Diabetes type II Other      ROS:  Please see the history of present illness.  General:no colds or fevers, no weight changes Skin:no rashes or ulcers HEENT:no blurred vision, no congestion CV:see HPI PUL:see HPI GI:no diarrhea constipation  or melena, no indigestion GU:no hematuria, no dysuria MS:no joint pain, no claudication Neuro:no syncope, no lightheadedness Endo:no diabetes, no thyroid disease  All other ROS reviewed and negative.     Physical Exam/Data:   Vitals:   11/03/22 1600 11/03/22 1714 11/03/22 1745 11/03/22 1805  BP: (!) 81/58 (!) 93/59 (!) 89/64 (!) 87/60  Pulse: (!) 117 (!) 109 (!) 106 95  Resp: 16 18 (!) 5 19  Temp:    98.2 F (36.8 C)  TempSrc:      SpO2: 100% 99% 100% 100%    Intake/Output Summary (Last 24 hours) at 11/03/2022 1853 Last data filed at 11/03/2022 1812 Gross per 24 hour  Intake 500 ml  Output --  Net 500 ml      10/03/2022   12:38 PM 10/03/2022    8:15 AM 10/02/2022    7:52 AM  Last 3 Weights  Weight (lbs) 164 lb 0.4 oz 170 lb 10.2 oz 193 lb 5.5 oz  Weight (kg) 74.4 kg 77.4 kg 87.7 kg     There is no height or weight on file to calculate BMI.  General:  Well nourished, well developed, in no acute distress, she is not sure why she is here HEENT: normal Neck: no JVD Vascular: ?L carotid bruit vs radiation of bruit from AV graft in lt upper arm; no Rt bruit Distal pulses weak Cardiac:  irreg irreg; no murmur gallup rub or click Lungs:  clear to auscultation bilaterally, no wheezing, rhonchi or rales  Abd: soft, nontender, no hepatomegaly  Ext: no edema Musculoskeletal:  No deformities, BUE and BLE strength normal and equal Skin: warm and dry  Neuro:  alert and oriented X 3 MAE follows commands, no focal abnormalities noted Psych:  Normal affect     Relevant CV Studies: 10/02/22  Echo  IMPRESSIONS     1. Left ventricular ejection fraction, by estimation, is 45 to 50%. The  left ventricle has mildly decreased function. The left ventricle  demonstrates regional wall motion abnormalities with akinesis of the  apical lateral wall and true apex.   2. Left atrial size was mildly dilated.   3. Right ventricular systolic function is normal. The right ventricular  size is  normal.   4. Moderate mitral annular calcification.   5. The aortic valve is tricuspid. There is mild calcification of the  aortic valve.   6. The inferior vena cava is dilated in size with >50% respiratory  variability, suggesting right atrial pressure of 8 mmHg.   7. Moderate pericardial effusion. The pericardial effusion is  circumferential. There is no evidence of cardiac tamponade.   8. Limited echo   FINDINGS   Left Ventricle: Left ventricular ejection fraction, by estimation, is 45  to 50%. The left ventricle has mildly decreased function. The left  ventricle demonstrates regional wall motion abnormalities. The left  ventricular internal cavity size was normal  in size. There is no left ventricular hypertrophy.   Right Ventricle: The right ventricular size is normal. Right ventricular  systolic function is normal.   Left Atrium: Left atrial size was mildly dilated.   Right Atrium: Right atrial size was normal in size.   Pericardium: A moderately sized pericardial effusion is present. The  pericardial effusion is circumferential. There is no evidence of cardiac  tamponade.   Mitral Valve: Moderate mitral annular calcification.   Aortic Valve: The aortic valve is tricuspid. There is mild calcification  of the aortic valve.   Venous: The inferior vena cava is dilated in size with greater than 50%  respiratory variability, suggesting right atrial pressure of 8 mmHg.   Cardiac cath 09/13/22    Lat 3rd Mrg lesion is 100% stenosed.  Balloon angioplasty was performed using a BALLN EMERGE MR 2.0X12.   Post intervention, there is a 0% residual stenosis.   Dist LAD lesion is 100% stenosed.  Balloon angioplasty was performed using a BALLN EMERGE MR 2.0X12.   Post intervention, there is a 100% residual stenosis.   Mid Cx lesion is 50% stenosed.  Thrombotic.   LV end diastolic pressure is moderately elevated.   Recommend to resume Apixaban, at currently prescribed dose and  frequency.   Recommend concurrent antiplatelet therapy of Clopidogrel 75mg  daily for 12 months.   No atherosclerotic coronary artery disease.  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  Angioplasty done in both areas but neither area receiving TIMI-3 flow.  Resume heparin 6 hours after femoral sheath removal.  She will need echocardiogram to look for source of embolism.  Would consider long-term oral anticoagulation given her presentation here today.  Could also use clopidogrel for antiplatelet therapy depending on whether or not she has bleeding issues.  Will watch in 2 heart.  Diagnostic Dominance: Right  Intervention       Laboratory Data:  High Sensitivity Troponin:   Recent Labs  Lab 11/03/22 1612  TROPONINIHS 31*     Chemistry Recent Labs  Lab 11/03/22 1612  NA 137  K 3.6  CL 93*  CO2 31  GLUCOSE 82  BUN 13  CREATININE 5.38*  CALCIUM 9.8  MG 2.2  GFRNONAA 9*  ANIONGAP 13    Recent Labs  Lab 11/03/22 1612  PROT 7.7  ALBUMIN 2.9*  AST 14*  ALT 11  ALKPHOS 55  BILITOT 0.5   Lipids No results for input(s): "CHOL", "TRIG", "HDL", "LABVLDL", "LDLCALC", "CHOLHDL" in the last 168 hours.  Hematology Recent Labs  Lab 11/03/22 1612  WBC 5.7  RBC 3.59*  HGB 9.8*  HCT 32.1*  MCV 89.4  MCH 27.3  MCHC 30.5  RDW 19.3*  PLT 255   Thyroid No results for input(s): "TSH", "FREET4" in the last 168 hours.  BNPNo results for input(s): "BNP", "PROBNP" in the last 168 hours.  DDimer No results for input(s): "DDIMER" in the last 168 hours.   Radiology/Studies:  DG Chest Portable 1 View  Result Date: 11/03/2022 CLINICAL DATA:  Shortness of breath. EXAM: PORTABLE CHEST 1 VIEW COMPARISON:  10/01/2022 FINDINGS: Stable enlarged cardiac silhouette. Mild diffuse peribronchial thickening and accentuation of the interstitial markings mild improvement. Mild prominence of the pulmonary vasculature  improvement. Suggestion of a small amount of ill-defined patchy density in the right mid and lower lung zones. No pleural fluid. Thoracic spine degenerative changes. IMPRESSION: 1. Cardiomegaly with mild congestive heart failure, mildly improved. 2. Possible mild right mid and lower lung zone pneumonia. Electronically Signed   By: Beckie Salts M.D.   On: 11/03/2022 16:29     Assessment and Plan:   Atrial fib RVR recurrent pt is on Eliquis. Stated she had mot missed any doses previously on discharge with thromboembolic MI cardiology noted not a candidate for rhythm control due to recent thromboembolic event so rate control.  She converted spontaneously on BB and was in SR 09/28/22.  Will defer to Dr. Antoine Poche to decide if we could now add amiodarone and keep out of atrial fib. Vs rate control again. Currently BP too low for her BB of lopressor 12.5 BID.     CAD with Prior embolic infarct involving distal LAD and OM. There is no role for repeat invasive evaluation in this setting.Continue antianginal therapy as prior low dose Imdur and metoprolol on nondialysis days  Chronic systolic HF  EF had been 35-40% but on recent echo 10/02/22 EF 45-50%.   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  ERSD on dialysis per renal Leg pain bil today I do not feel strong pedal pulses would check ABIs bil lower ext. Mod pericardial effusion on echo 10/02/22 no tamponade TR is mod to severe.    Risk Assessment/Risk Scores:          CHA2DS2-VASc Score = 4   This indicates a 4.8% annual risk of stroke. The patient's score is based upon: CHF History: 1 HTN History: 1 Diabetes History: 0 Stroke History: 0 Vascular Disease History: 1 Age Score: 0 Gender Score: 1         For questions or updates, please contact [facility] Please consult [email or web address] for contact info under    Signed, Rollene Rotunda, MD  11/03/2022 6:53 PM    History and all data above reviewed.  Patient examined.  I agree with the findings as above.  The patient presented to dialysis and really did not report any symptoms to me.  She says today with 1 day she walked into dialysis.  Some days she is too weak to walk and dialysis.  She was noted to be in atrial fibrillation and she would not feel the palpitations.  She would not know that her heart was elevated.  She never had presyncope or syncope.  She never any chest pressure, neck or arm discomfort.  She has chronic lower extremity swelling.  She had chronic nausea.  She sleeps with her head up a little bit.  She gets around with a walker because of chronic lower extremity weakness.  The patient exam reveals COR: Irregular, no rubs,  Lungs: Clear to auscultation bilaterally,  Abd: Positive bowel sounds normal frequency pitch, Ext diminished dorsalis pedis and posttibial's bilateral lower extremity pain.  All available labs, radiology testing, previous records reviewed. Agree with documented assessment and plan.  Atrial fib:  Will plan amiodarone IV since she has recently been in NSR and on anticoagulation. I think her risk of thromboembolism is going to be low and rate control with be difficult with her low BP that will limit use of AV nodal blocking agents.     Pericardial effusion:  Repeat echo.   CAD:  Continue medical management.   Leg pain: Check ABIs.  Rollene Rotunda  6:53 PM  11/03/2022

## 2022-11-03 NOTE — Assessment & Plan Note (Signed)
Continue with midodrine 10 mg tid.

## 2022-11-03 NOTE — Subjective & Objective (Signed)
CC: palpitations HPI: 47 year old African-American female history of end-stage renal disease on hemodialysis Tuesday Thursday Saturday for last 14 years, anemia chronic kidney disease, chronic respiratory failure on oxygen at 3 L a minute, chronic systolic heart failure EF of 45%, chronic pericardial effusion, atrial fibrillation, hypotension of hemodialysis, presents to the ER after having palpitations during dialysis today.  Patient does have a history of A-fib and is ready on Eliquis.  She has been seen by cardiology ready and decision was made for rate control rather than rhythm control.  Due to her hypotension related to dialysis, Lopressor or Cardizem was not an option.  Decision made by cardiology to start her on IV amiodarone to load her.  [facility] hospitalist consulted.  Patient denies any chest pain but does have palpitations.  She has chronic shortness of breath.  She is on chronic oxygen at home.  She had a left heart catheterization in June 2024.  She had balloon angioplasty of the third lateral marginal and the distal LAD.  Unfortunately angioplasty did not restore TIMI-3 flow.  There appears to be embolization of the thrombus down to the distal LAD and the distal OM branch.  Stent placement was not performed.

## 2022-11-03 NOTE — H&P (Signed)
History and Physical    Desiree [name] VWU:[id number] DOB: [birth year] DOA: 11/03/2022  DOS: the patient was seen and examined on 11/03/2022  PCP: Jearld Lesch, MD   Patient coming from: Home  I have personally briefly reviewed patient's old medical records in [facility]  CC: palpitations HPI: 47 year old African-American female history of end-stage renal disease on hemodialysis Tuesday Thursday Saturday for last 14 years, anemia chronic kidney disease, chronic respiratory failure on oxygen at 3 L a minute, chronic systolic heart failure EF of 45%, chronic pericardial effusion, atrial fibrillation, hypotension of hemodialysis, presents to the ER after having palpitations during dialysis today.  Patient does have a history of A-fib and is ready on Eliquis.  She has been seen by cardiology ready and decision was made for rate control rather than rhythm control.  Due to her hypotension related to dialysis, Lopressor or Cardizem was not an option.  Decision made by cardiology to start her on IV amiodarone to load her.  [facility] hospitalist consulted.  Patient denies any chest pain but does have palpitations.  She has chronic shortness of breath.  She is on chronic oxygen at home.  She had a left heart catheterization in June 2024.  She had balloon angioplasty of the third lateral marginal and the distal LAD.  Unfortunately angioplasty did not restore TIMI-3 flow.  There appears to be embolization of the thrombus down to the distal LAD and the distal OM branch.  Stent placement was not performed.   ED Course: cardiology consulted for rapid afib. IV amiodarone ordered  Review of Systems:  Review of Systems  Constitutional: Negative.   HENT: Negative.    Eyes: Negative.   Respiratory:  Positive for shortness of breath.        Chronic SOB  Cardiovascular:  Positive for palpitations.  Gastrointestinal:  Positive for nausea.  Genitourinary: Negative.   Musculoskeletal: Negative.    Skin: Negative.   Neurological: Negative.   Endo/Heme/Allergies: Negative.   Psychiatric/Behavioral: Negative.    All other systems reviewed and are negative.   Past Medical History:  Diagnosis Date   Anemia    Anxiety    panic attacks   Arthritis    bilateral knees   Childhood asthma    Complication of anesthesia    "sometimes it does not work; didn't during LEEP OR" (01/21/2016)   Depression    no med   ESRD (end stage renal disease) on dialysis (HCC)    "TTS; [facility]; [facility]" (08/03/2016)   GERD (gastroesophageal reflux disease)    nexium prn   Gout    History of blood transfusion    "related to kidneys; I've had 4" (01/21/2016)   Hypertension    Hypertensive urgency 12/05/2021   Migraine    last one 01/18/19   Preterm labor ~ 2014   Retina hole, left    Seizures (HCC)    "last one was in 2000; related to preeclampsia" (01/21/2016)    Past Surgical History:  Procedure Laterality Date   A/V FISTULAGRAM Left 12/19/2021   Procedure: A/V Fistulagram;  Surgeon: Chuck Hint, MD;  Location: [facility] INVASIVE CV LAB;  Service: Cardiovascular;  Laterality: Left;   AV FISTULA PLACEMENT Left 2010   AV FISTULA PLACEMENT Left 01/20/2019   Procedure: ARTERIOVENOUS (AV) FISTULA CREATION LEFT UPPER ARM;  Surgeon: Chuck Hint, MD;  Location: [facility] OR;  Service: Vascular;  Laterality: Left;   AV FISTULA PLACEMENT Left 12/19/2021   Procedure: ARTERIOVENOUS (AV) FISTULA CREATION  POSSIBLE TUNNELED DIALYSIS;  Surgeon: Maeola Harman, MD;  Location: [facility] OR;  Service: Vascular;  Laterality: Left;   CERVICAL BIOPSY  W/ LOOP ELECTRODE EXCISION  2001   CORONARY/GRAFT ACUTE MI REVASCULARIZATION N/A 09/12/2022   Procedure: Coronary/Graft Acute MI Revascularization;  Surgeon: Corky Crafts, MD;  Location: [facility] INVASIVE CV LAB;  Service: Cardiovascular;  Laterality: N/A;   DILATION AND EVACUATION  08/02/2011   Procedure: DILATATION AND EVACUATION;  Surgeon: Oliver Pila, MD;  Location: [facility] ORS;  Service: Gynecology;;   DILATION AND EVACUATION N/A 08/31/2013   Procedure: DILATATION AND EVACUATION;  Surgeon: Adam Phenix, MD;  Location: [facility] ORS;  Service: Gynecology;  Laterality: N/A;   FISTULA SUPERFICIALIZATION Left 01/20/2019   Procedure: Fistula Superficialization;  Surgeon: Chuck Hint, MD;  Location: [facility] OR;  Service: Vascular;  Laterality: Left;   HYDRADENITIS EXCISION Right    INSERTION OF DIALYSIS CATHETER Right 12/19/2021   Procedure: INSERTION OF A TUNNELED DIALYSIS CATHETER;  Surgeon: Maeola Harman, MD;  Location: [facility] OR;  Service: Vascular;  Laterality: Right;   LEFT HEART CATH AND CORONARY ANGIOGRAPHY N/A 09/12/2022   Procedure: LEFT HEART CATH AND CORONARY ANGIOGRAPHY;  Surgeon: Corky Crafts, MD;  Location: [facility] INVASIVE CV LAB;  Service: Cardiovascular;  Laterality: N/A;   RENAL BIOPSY     REVISION OF ARTERIOVENOUS GORETEX GRAFT Left 02/11/2013   Procedure: REVISION OF ARTERIOVENOUS GORTEX FISTULA;  Surgeon: Larina Earthly, MD;  Location: [facility] OR;  Service: Vascular;  Laterality: Left;   THROMBECTOMY W/ EMBOLECTOMY Left 05/25/2018   Procedure: REPAIR OF BLEEDING ARTERIOVENOUS FISTULA;  Surgeon: Nada Libman, MD;  Location: [facility] OR;  Service: Vascular;  Laterality: Left;     reports that she has quit smoking. Her smoking use included cigarettes. She has been exposed to tobacco smoke. She has never used smokeless tobacco. She reports current drug use. Drug: Marijuana. She reports that she does not drink alcohol.  Allergies  Allergen Reactions   Morphine Shortness Of Breath and Anaphylaxis   Prednisone Other (See Comments)    Other reaction(s): Other (See Comments) Muscle spasms Patient says prednisone causes her to cramp all over, muscle spasms uncontrolled   Tuna [Fish Allergy] Itching, Swelling, Rash and Other (See Comments)    Face droops also   Amlodipine Other (See Comments)    Angioedema (09/05/17 ED  visit)   Iodinated Contrast Media Itching   Tape Itching    Adhesive tape   paper tape ok    Family History  Problem Relation Age of Onset   Diabetes Mother    Hyperlipidemia Mother    Hypertension Mother    Heart disease Mother    Hypertension Other    Diabetes type II Other     Prior to Admission medications   Medication Sig Start Date End Date Taking? Authorizing Provider  acetaminophen (TYLENOL) 325 MG tablet Take 325 mg by mouth every 6 (six) hours as needed for mild pain.   Yes [provider]  albuterol (PROVENTIL HFA;VENTOLIN HFA) 108 (90 Base) MCG/ACT inhaler Inhale 2 puffs into the lungs every 4 (four) hours as needed for wheezing or shortness of breath. 05/02/16  Yes Lavera Guise, MD  ALPRAZolam Prudy Feeler) 1 MG tablet Take 1 mg by mouth 3 (three) times daily. 11/25/21  Yes [provider]  apixaban (ELIQUIS) 5 MG TABS tablet Take 1 tablet (5 mg total) by mouth 2 (two) times daily. 09/28/22  Yes Alver Sorrow, NP  atorvastatin (  LIPITOR) 80 MG tablet Take 1 tablet (80 mg total) by mouth daily. 09/28/22  Yes Alver Sorrow, NP  clopidogrel (PLAVIX) 75 MG tablet Take 1 tablet (75 mg total) by mouth daily with breakfast. 09/17/22  Yes Jonita Albee, PA-C  cyclobenzaprine (FLEXERIL) 10 MG tablet Take 10 mg by mouth 2 (two) times daily as needed for muscle spasms.   Yes [provider]  ferric citrate (AURYXIA) 1 GM 210 MG(Fe) tablet Take 1 tablet (210 mg total) by mouth 3 (three) times daily with meals. 09/17/22  Yes Corky Crafts, MD  fluticasone furoate-vilanterol (BREO ELLIPTA) 100-25 MCG/ACT AEPB Inhale 1 puff into the lungs daily. 04/30/22  Yes Oretha Milch, MD  folic acid-vitamin b complex-vitamin c-selenium-zinc (DIALYVITE) 3 MG TABS tablet Take 1 tablet by mouth daily.   Yes [provider]  isosorbide mononitrate (IMDUR) 30 MG 24 hr tablet Take a half tablet in the morning on non-dialysis days. Patient taking differently: Take  15 mg by mouth 2 (two) times daily. 09/28/22  Yes Alver Sorrow, NP  lactulose (CHRONULAC) 10 GM/15ML solution Take 30 mLs by mouth daily as needed for mild constipation, moderate constipation or severe constipation. 09/04/21  Yes [provider]  loperamide (IMODIUM) 2 MG capsule Take 2 mg by mouth as needed for diarrhea or loose stools.   Yes [provider]  midodrine (PROAMATINE) 10 MG tablet Take 10 mg by mouth 3 (three) times daily as needed (Hypotension). 09/03/22  Yes [provider]  NARCAN 4 MG/0.1ML LIQD nasal spray kit Place 1 spray into the nose as needed (accidental overdose). 11/14/18  Yes [provider]  ondansetron (ZOFRAN-ODT) 8 MG disintegrating tablet Take 8 mg by mouth 2 (two) times daily.   Yes [provider]  Oxycodone HCl 10 MG TABS Take 1 tablet (10 mg total) by mouth 3 (three) times daily as needed (severe pain). Patient taking differently: Take 10 mg by mouth 3 (three) times daily as needed (For pain). 12/22/21  Yes Arrien, York Ram, MD  pantoprazole (PROTONIX) 40 MG tablet Take 1 tablet (40 mg total) by mouth daily. 09/28/22 03/27/23 Yes Alver Sorrow, NP  sevelamer carbonate (RENVELA) 800 MG tablet Take 1,600 mg by mouth 3 (three) times daily. 05/18/19  Yes [provider]  cinacalcet (SENSIPAR) 30 MG tablet Take 2 tablets (60 mg total) by mouth daily with supper. Patient not taking: Reported on 11/03/2022 09/17/22   Corky Crafts, MD  metoprolol tartrate (LOPRESSOR) 25 MG tablet Take 0.5 tablets (12.5 mg total) by mouth 2 (two) times daily. Patient not taking: Reported on 11/03/2022 09/28/22   Alver Sorrow, NP    Physical Exam: Vitals:   11/03/22 1915 11/03/22 1930 11/03/22 1945 11/03/22 2000  BP: 100/74 (!) 101/57 98/65 99/60   Pulse: 98 97 99 (!) 115  Resp: (!) 5 (!) 5 (!) 9 (!) 8  Temp:      TempSrc:      SpO2: 100% 100% 100% 100%    Physical Exam Vitals and nursing note reviewed.   Constitutional:      Comments: Chronically ill appearing  HENT:     Head: Normocephalic and atraumatic.     Nose: Nose normal.  Eyes:     General: No scleral icterus. Cardiovascular:     Rate and Rhythm: Tachycardia present. Rhythm irregular.  Pulmonary:     Effort: Pulmonary effort is normal.     Breath sounds: Normal breath sounds.  Abdominal:  General: Bowel sounds are normal. There is no distension.     Tenderness: There is no abdominal tenderness.  Musculoskeletal:     Right lower leg: No edema.     Left lower leg: No edema.  Skin:    General: Skin is warm and dry.     Capillary Refill: Capillary refill takes less than 2 seconds.  Neurological:     General: No focal deficit present.     Mental Status: She is alert and oriented to person, place, and time.      Labs on Admission: I have personally reviewed following labs and imaging studies  CBC: Recent Labs  Lab 11/03/22 1612  WBC 5.7  HGB 9.8*  HCT 32.1*  MCV 89.4  PLT 255   Basic Metabolic Panel: Recent Labs  Lab 11/03/22 1612  NA 137  K 3.6  CL 93*  CO2 31  GLUCOSE 82  BUN 13  CREATININE 5.38*  CALCIUM 9.8  MG 2.2  PHOS 3.3   GFR: CrCl cannot be calculated (Unknown ideal weight.). Liver Function Tests: Recent Labs  Lab 11/03/22 1612  AST 14*  ALT 11  ALKPHOS 55  BILITOT 0.5  PROT 7.7  ALBUMIN 2.9*   Cardiac Enzymes: Recent Labs  Lab 11/03/22 1612  TROPONINIHS 31*   BNP (last 3 results) Recent Labs    02/03/22 1708 03/31/22 1750 09/14/22 0243  BNP 518.9* 623.7* 1,704.8*    Radiological Exams on Admission: I have personally reviewed images DG Chest Portable 1 View  Result Date: 11/03/2022 CLINICAL DATA:  Shortness of breath. EXAM: PORTABLE CHEST 1 VIEW COMPARISON:  10/01/2022 FINDINGS: Stable enlarged cardiac silhouette. Mild diffuse peribronchial thickening and accentuation of the interstitial markings mild improvement. Mild prominence of the pulmonary vasculature  improvement. Suggestion of a small amount of ill-defined patchy density in the right mid and lower lung zones. No pleural fluid. Thoracic spine degenerative changes. IMPRESSION: 1. Cardiomegaly with mild congestive heart failure, mildly improved. 2. Possible mild right mid and lower lung zone pneumonia. Electronically Signed   By: Beckie Salts M.D.   On: 11/03/2022 16:29    EKG: My personal interpretation of EKG shows: on my read, looks rapid aflutter with variable block      Assessment/Plan Principal Problem:   Rapid atrial fibrillation (HCC) Active Problems:   Anemia of chronic kidney failure   ESRD (end stage renal disease) on dialysis (HCC)   GERD (gastroesophageal reflux disease)   Chronic respiratory failure with hypoxia (HCC) - on hme O2 @ 3 L/min   Pericardial effusion   Hypotension of hemodialysis    Assessment and Plan: * Rapid atrial fibrillation (HCC) Observation progressive bed. Cardiology consulted. Wanted to start amiodarone. Repeat EKG looks more like atrial flutter. Continue with Eliquis. Check TSH.  Chronic respiratory failure with hypoxia (HCC) - on hme O2 @ 3 L/min Continue with 3 L/min O2. Stable.  GERD (gastroesophageal reflux disease) Stable.  ESRD (end stage renal disease) on dialysis [facility]) Continue with HD on T, Th, Sat. Had HD today. If pt still here on Thursday, will need nephrology consult for routine HD.  Anemia of chronic kidney failure Chronic.  Hypotension of hemodialysis Continue with midodrine 10 mg tid.  Pericardial effusion Chronic. Has moderate pericardial effusion. Seen on echo 09-2022 and 08-2022   DVT prophylaxis: Eliquis Code Status: Full Code Family Communication: no family at bedside  Disposition Plan: return home  Consults called: EDP has consulted cardiology(Hochrein)  Admission status: Observation, Telemetry bed   Minerva Areola  Imogene Burn, DO [facility] 11/03/2022, 8:25 PM

## 2022-11-03 NOTE — Assessment & Plan Note (Signed)
Continue with 3 L/min O2. Stable.

## 2022-11-03 NOTE — ED Provider Notes (Signed)
  Physical Exam  BP 90/60   Pulse (!) 107   Temp 98.2 F (36.8 C)   Resp 18   SpO2 100%   Physical Exam  Procedures  Procedures  ED Course / MDM    Medical Decision Making Care assumed that 4 PM.  Patient is here with rapid A-fib.  Patient is also hypotensive on arrival and just finished dialysis.  Signout pending cardiology evaluation and admission  7:39 PM Dr. Antoine Poche saw the patient.  He recommends starting amiodarone drip.  At this point hospitalist to admit for rapid A-fib.  CRITICAL CARE Performed by: Richardean Canal   Total critical care time: 33 minutes  Critical care time was exclusive of separately billable procedures and treating other patients.  Critical care was necessary to treat or prevent imminent or life-threatening deterioration.  Critical care was time spent personally by me on the following activities: development of treatment plan with patient and/or surrogate as well as nursing, discussions with consultants, evaluation of patient's response to treatment, examination of patient, obtaining history from patient or surrogate, ordering and performing treatments and interventions, ordering and review of laboratory studies, ordering and review of radiographic studies, pulse oximetry and re-evaluation of patient's condition.   Problems Addressed: Atrial fibrillation with RVR (HCC): acute illness or injury  Amount and/or Complexity of Data Reviewed Labs: ordered. Decision-making details documented in ED Course. Radiology: ordered and independent interpretation performed. Decision-making details documented in ED Course.  Risk Prescription drug management. Decision regarding hospitalization.          Charlynne Pander, MD 11/03/22 [phone]

## 2022-11-03 NOTE — Assessment & Plan Note (Signed)
Stable

## 2022-11-03 NOTE — Assessment & Plan Note (Signed)
Continue with HD on T, Th, Sat. Had HD today. If pt still here on Thursday, will need nephrology consult for routine HD.

## 2022-11-03 NOTE — Assessment & Plan Note (Signed)
Observation progressive bed. Cardiology consulted. Wanted to start amiodarone. Repeat EKG looks more like atrial flutter. Continue with Eliquis. Check TSH.

## 2022-11-03 NOTE — ED Provider Notes (Signed)
EMERGENCY DEPARTMENT AT [facility] Provider Note   CSN: [id number] Arrival date & time: 11/03/22  1409     History  Chief Complaint  Patient presents with   Palpitations    Desiree [name] is a 47 y.o. female w/ pmhx of esrd on dialysis, afib on Eliquis, chronic hypotension, CHF, recent MI and pericardial effusion and blood transfusion on Aug 16 presenting for  afib rvr. Pt is reporting associated sx of weakness, SOB, and found to have fast heart rate. Pt reports she was at dialysis today and that everything was fine -  not having any symptoms, but possibly had a high HR at that time. She then went home and was having trouble getting out of her vehicle d/t weakness. Pt denies CP, cough, fevers, chills. Reports she took medications this morning.    Palpitations      Home Medications Prior to Admission medications   Medication Sig Start Date End Date Taking? Authorizing Provider  acetaminophen (TYLENOL) 325 MG tablet Take 325 mg by mouth every 6 (six) hours as needed for mild pain.   Yes [provider]  albuterol (PROVENTIL HFA;VENTOLIN HFA) 108 (90 Base) MCG/ACT inhaler Inhale 2 puffs into the lungs every 4 (four) hours as needed for wheezing or shortness of breath. 05/02/16  Yes Lavera Guise, MD  ALPRAZolam Prudy Feeler) 1 MG tablet Take 1 mg by mouth 3 (three) times daily. 11/25/21  Yes [provider]  apixaban (ELIQUIS) 5 MG TABS tablet Take 1 tablet (5 mg total) by mouth 2 (two) times daily. 09/28/22  Yes Alver Sorrow, NP  atorvastatin (LIPITOR) 80 MG tablet Take 1 tablet (80 mg total) by mouth daily. 09/28/22  Yes Alver Sorrow, NP  clopidogrel (PLAVIX) 75 MG tablet Take 1 tablet (75 mg total) by mouth daily with breakfast. 09/17/22  Yes Jonita Albee, PA-C  cyclobenzaprine (FLEXERIL) 10 MG tablet Take 10 mg by mouth 2 (two) times daily as needed for muscle spasms.   Yes [provider]  ferric citrate (AURYXIA) 1 GM 210  MG(Fe) tablet Take 1 tablet (210 mg total) by mouth 3 (three) times daily with meals. 09/17/22  Yes Corky Crafts, MD  fluticasone furoate-vilanterol (BREO ELLIPTA) 100-25 MCG/ACT AEPB Inhale 1 puff into the lungs daily. 04/30/22  Yes Oretha Milch, MD  folic acid-vitamin b complex-vitamin c-selenium-zinc (DIALYVITE) 3 MG TABS tablet Take 1 tablet by mouth daily.   Yes [provider]  isosorbide mononitrate (IMDUR) 30 MG 24 hr tablet Take a half tablet in the morning on non-dialysis days. Patient taking differently: Take 15 mg by mouth 2 (two) times daily. 09/28/22  Yes Alver Sorrow, NP  lactulose (CHRONULAC) 10 GM/15ML solution Take 30 mLs by mouth daily as needed for mild constipation, moderate constipation or severe constipation. 09/04/21  Yes [provider]  loperamide (IMODIUM) 2 MG capsule Take 2 mg by mouth as needed for diarrhea or loose stools.   Yes [provider]  midodrine (PROAMATINE) 10 MG tablet Take 10 mg by mouth 3 (three) times daily as needed (Hypotension). 09/03/22  Yes [provider]  NARCAN 4 MG/0.1ML LIQD nasal spray kit Place 1 spray into the nose as needed (accidental overdose). 11/14/18  Yes [provider]  ondansetron (ZOFRAN-ODT) 8 MG disintegrating tablet Take 8 mg by mouth 2 (two) times daily.   Yes [provider]  Oxycodone HCl 10 MG TABS Take 1 tablet (10 mg total) by mouth  3 (three) times daily as needed (severe pain). Patient taking differently: Take 10 mg by mouth 3 (three) times daily as needed (For pain). 12/22/21  Yes Arrien, York Ram, MD  pantoprazole (PROTONIX) 40 MG tablet Take 1 tablet (40 mg total) by mouth daily. 09/28/22 03/27/23 Yes Alver Sorrow, NP  sevelamer carbonate (RENVELA) 800 MG tablet Take 1,600 mg by mouth 3 (three) times daily. 05/18/19  Yes [provider]  cinacalcet (SENSIPAR) 30 MG tablet Take 2 tablets (60 mg total) by mouth daily with supper. Patient not  taking: Reported on 11/03/2022 09/17/22   Corky Crafts, MD  metoprolol tartrate (LOPRESSOR) 25 MG tablet Take 0.5 tablets (12.5 mg total) by mouth 2 (two) times daily. Patient not taking: Reported on 11/03/2022 09/28/22   Alver Sorrow, NP      Allergies    Morphine, Prednisone, [place] allergy], Amlodipine, Iodinated contrast media, and Tape    Review of Systems   Review of Systems  Cardiovascular:  Positive for palpitations.    Physical Exam Updated Vital Signs BP 93/66 (BP Location: Right Arm)   Pulse (!) 116   Temp 98.4 F (36.9 C) (Oral)   Resp 16   SpO2 96%  Physical Exam Vitals and nursing note reviewed.  Constitutional:      General: She is not in acute distress.    Appearance: She is not toxic-appearing.  HENT:     Head: Normocephalic and atraumatic.  Eyes:     General: No scleral icterus.    Conjunctiva/sclera: Conjunctivae normal.  Cardiovascular:     Rate and Rhythm: Rhythm regularly irregular.     Pulses: Normal pulses.     Heart sounds: Normal heart sounds.  Pulmonary:     Effort: Pulmonary effort is normal. No respiratory distress.     Breath sounds: Normal breath sounds.  Abdominal:     General: Abdomen is flat. Bowel sounds are normal.     Palpations: Abdomen is soft.     Tenderness: There is no abdominal tenderness.  Skin:    General: Skin is warm and dry.     Findings: No lesion.  Neurological:     General: No focal deficit present.     Mental Status: She is alert and oriented to person, place, and time. Mental status is at baseline.     ED Results / Procedures / Treatments   Labs (all labs ordered are listed, but only abnormal results are displayed) Labs Reviewed  MAGNESIUM  CBC  COMPREHENSIVE METABOLIC PANEL  PHOSPHORUS  TROPONIN I (HIGH SENSITIVITY)    EKG EKG Interpretation Date/Time:  Tuesday November 03 2022 14:19:22 EDT Ventricular Rate:  115 PR Interval:    QRS Duration:  75 QT Interval:  388 QTC  Calculation: 537 R Axis:   -4  Text Interpretation: Atrial fibrillation Borderline low voltage, extremity leads Abnormal R-wave progression, late transition Prolonged QT interval new Afib noted Confirmed by Derwood Kaplan [phone]) on 11/03/2022 2:36:22 PM  Radiology No results found.  Procedures .Critical Care  Performed by: Smitty Knudsen, PA-C Authorized by: Smitty Knudsen, PA-C   Critical care provider statement:    Critical care time (minutes):  42   Critical care was necessary to treat or prevent imminent or life-threatening deterioration of the following conditions:  Renal failure and cardiac failure   Critical care was time spent personally by me on the following activities:  Blood draw for specimens, discussions with consultants, evaluation of patient's response to treatment, examination  of patient, ordering and performing treatments and interventions, ordering and review of laboratory studies, ordering and review of radiographic studies, pulse oximetry, re-evaluation of patient's condition and review of old charts     Medications Ordered in ED Medications - No data to display  ED Course/ Medical Decision Making/ A&P                                 Medical Decision Making Amount and/or Complexity of Data Reviewed Labs: ordered. Radiology: ordered.  Risk Prescription drug management.   This patient presents to the ED for concern of afib rvr, this involves an extensive number of treatment options, and is a complaint that carries with it a high risk of complications and morbidity.  The differential diagnosis includes afib rvr, svt, pvc, sinus tachycardia, electrolyte abnormalities, dehydrations, Hypotension    Co morbidities that complicate the patient evaluation  History of blood transfusion ESRD  CHF CAD   Additional history obtained:  Additional history obtained from EMS at bedside with patient and chart review    Lab Tests:  I Ordered, and personally  interpreted labs.  The pertinent results include:  PENDING Cbc Cmp Mg Phos Trop    Imaging Studies ordered:  I ordered imaging studies including cxray  I independently visualized and interpreted imaging which showed unremarkable  I agree with the radiologist interpretation   Cardiac Monitoring: / EKG:  The patient was maintained on a cardiac monitor.  I personally viewed and interpreted the cardiac monitored which showed an underlying rhythm of: afib rvr - rate 115   Consultations Obtained:  None   Problem List / ED Course / Critical interventions / Medication management  Pt w/ pmhx of esrd on dialysis, afib on Eliquis, chronic hypotension, CHF, recent MI and pericardial effusion and blood transfusion reporting with afib rvr.  I ordered medication including Fluids  for Bp  Reevaluation of the patient after these medicines showed that the patient improved I have reviewed the patients home medicines and have made adjustments as needed   Plan Signed off patient with oncoming ED Provider         Final Clinical Impression(s) / ED Diagnoses Final diagnoses:  None    Rx / DC Orders ED Discharge Orders     None         Smitty Knudsen, PA-C 11/03/22 1651    Derwood Kaplan, MD 11/04/22 [phone]

## 2022-11-03 NOTE — ED Triage Notes (Signed)
Pt BIBEMS w cc of palpitations. Pt was at dr office when she was noted to be in afib. Pt was told to come to ED for further eval as pt has hx of MI. Pt is dialysis T/TH/Sat and received dialysis today. Upon arrival pt noted to still be in afib, denies chest pain.

## 2022-11-04 ENCOUNTER — Encounter (HOSPITAL_COMMUNITY): Payer: Self-pay | Admitting: Internal Medicine

## 2022-11-04 ENCOUNTER — Observation Stay (HOSPITAL_COMMUNITY): Payer: 59

## 2022-11-04 DIAGNOSIS — E785 Hyperlipidemia, unspecified: Secondary | ICD-10-CM | POA: Diagnosis present

## 2022-11-04 DIAGNOSIS — I480 Paroxysmal atrial fibrillation: Secondary | ICD-10-CM | POA: Diagnosis present

## 2022-11-04 DIAGNOSIS — Z992 Dependence on renal dialysis: Secondary | ICD-10-CM | POA: Diagnosis not present

## 2022-11-04 DIAGNOSIS — I252 Old myocardial infarction: Secondary | ICD-10-CM | POA: Diagnosis not present

## 2022-11-04 DIAGNOSIS — Z8249 Family history of ischemic heart disease and other diseases of the circulatory system: Secondary | ICD-10-CM | POA: Diagnosis not present

## 2022-11-04 DIAGNOSIS — Z7902 Long term (current) use of antithrombotics/antiplatelets: Secondary | ICD-10-CM | POA: Diagnosis not present

## 2022-11-04 DIAGNOSIS — I3139 Other pericardial effusion (noninflammatory): Secondary | ICD-10-CM | POA: Diagnosis present

## 2022-11-04 DIAGNOSIS — Z9981 Dependence on supplemental oxygen: Secondary | ICD-10-CM | POA: Diagnosis not present

## 2022-11-04 DIAGNOSIS — I4891 Unspecified atrial fibrillation: Secondary | ICD-10-CM

## 2022-11-04 DIAGNOSIS — J9611 Chronic respiratory failure with hypoxia: Secondary | ICD-10-CM | POA: Diagnosis present

## 2022-11-04 DIAGNOSIS — K219 Gastro-esophageal reflux disease without esophagitis: Secondary | ICD-10-CM | POA: Diagnosis present

## 2022-11-04 DIAGNOSIS — N186 End stage renal disease: Secondary | ICD-10-CM | POA: Diagnosis present

## 2022-11-04 DIAGNOSIS — I2510 Atherosclerotic heart disease of native coronary artery without angina pectoris: Secondary | ICD-10-CM | POA: Diagnosis present

## 2022-11-04 DIAGNOSIS — I9589 Other hypotension: Secondary | ICD-10-CM | POA: Diagnosis present

## 2022-11-04 DIAGNOSIS — I4892 Unspecified atrial flutter: Secondary | ICD-10-CM | POA: Diagnosis present

## 2022-11-04 DIAGNOSIS — Z833 Family history of diabetes mellitus: Secondary | ICD-10-CM | POA: Diagnosis not present

## 2022-11-04 DIAGNOSIS — I739 Peripheral vascular disease, unspecified: Secondary | ICD-10-CM | POA: Diagnosis not present

## 2022-11-04 DIAGNOSIS — Z91041 Radiographic dye allergy status: Secondary | ICD-10-CM | POA: Diagnosis not present

## 2022-11-04 DIAGNOSIS — D631 Anemia in chronic kidney disease: Secondary | ICD-10-CM | POA: Diagnosis present

## 2022-11-04 DIAGNOSIS — I5022 Chronic systolic (congestive) heart failure: Secondary | ICD-10-CM | POA: Diagnosis present

## 2022-11-04 DIAGNOSIS — I132 Hypertensive heart and chronic kidney disease with heart failure and with stage 5 chronic kidney disease, or end stage renal disease: Secondary | ICD-10-CM | POA: Diagnosis present

## 2022-11-04 DIAGNOSIS — M898X9 Other specified disorders of bone, unspecified site: Secondary | ICD-10-CM | POA: Diagnosis present

## 2022-11-04 DIAGNOSIS — I953 Hypotension of hemodialysis: Secondary | ICD-10-CM | POA: Diagnosis present

## 2022-11-04 DIAGNOSIS — Z7901 Long term (current) use of anticoagulants: Secondary | ICD-10-CM | POA: Diagnosis not present

## 2022-11-04 DIAGNOSIS — Z79899 Other long term (current) drug therapy: Secondary | ICD-10-CM | POA: Diagnosis not present

## 2022-11-04 DIAGNOSIS — Z87891 Personal history of nicotine dependence: Secondary | ICD-10-CM | POA: Diagnosis not present

## 2022-11-04 LAB — VAS US ABI WITH/WO TBI

## 2022-11-04 LAB — ECHOCARDIOGRAM COMPLETE: S' Lateral: 3.25 cm

## 2022-11-04 LAB — HEPATITIS B SURFACE ANTIGEN: Hepatitis B Surface Ag: NONREACTIVE

## 2022-11-04 MED ORDER — ONDANSETRON HCL 4 MG/2ML IJ SOLN: 4.0000 mg | Freq: Four times a day (QID) | INTRAMUSCULAR | Status: DC | PRN

## 2022-11-04 MED ORDER — AMIODARONE HCL 200 MG PO TABS
200.0000 mg | ORAL_TABLET | Freq: Two times a day (BID) | ORAL | Status: DC
  Administered 2022-11-04 – 2022-11-05 (×3): 200 mg via ORAL
  Filled 2022-11-04 (×3): qty 1

## 2022-11-04 MED ORDER — ONDANSETRON 4 MG PO TBDP
4.0000 mg | ORAL_TABLET | Freq: Once | ORAL | Status: AC
  Administered 2022-11-04: 4 mg via ORAL
  Filled 2022-11-04: qty 1

## 2022-11-04 MED ORDER — CHLORHEXIDINE GLUCONATE CLOTH 2 % EX PADS: 6.0000 | MEDICATED_PAD | Freq: Every day | CUTANEOUS | Status: DC

## 2022-11-04 MED ORDER — DOXERCALCIFEROL 4 MCG/2ML IV SOLN: 4.0000 ug | INTRAVENOUS | Status: DC

## 2022-11-04 MED ORDER — ONDANSETRON HCL 4 MG/2ML IJ SOLN
INTRAMUSCULAR | Status: AC
  Filled 2022-11-04: qty 2

## 2022-11-04 NOTE — Progress Notes (Signed)
ABI has been completed.    Results can be found under chart review under CV PROC. 11/04/2022 9:25 AM Corrina Steffensen RVT, RDMS

## 2022-11-04 NOTE — Progress Notes (Signed)
  Echocardiogram 2D Echocardiogram has been performed.  Desiree [name] 11/04/2022, 1:02 PM

## 2022-11-04 NOTE — TOC Initial Note (Signed)
Transition of Care [facility]) - Initial/Assessment Note    Patient Details  Name: Desiree [name] MRN: [id number] Date of Birth: 07/25/1975  [facility]) CM/SW Contact:    Lawerance Sabal, RN Phone Number: 11/04/2022, 2:50 PM  Clinical Narrative:                  Chart reviewed.  Patient admitted from MD office for tachycardia/ A fib/ low BP. Eliquis PTA.  Patient is ESRD HD TTS [place].  She is from home w spouse, has 3L oxygen baseline, [employer], was DC'd last month w HH services through [facility]. If patient needs HH services post DC, [facility] is willing to consider accepting again depending on services needed.  Insurance coverage and PCP listed.  TOC will continue to follow.    Barriers to Discharge: Continued Medical Work up   Patient Goals and CMS Choice     Choice offered to / list presented to : NA      Expected Discharge Plan and Services                                              Prior Living Arrangements/Services                       Activities of Daily Living Home Assistive Devices/Equipment: Dan Humphreys (specify type) ADL Screening (condition at time of admission) Patient's cognitive ability adequate to safely complete daily activities?: Yes Is the patient deaf or have difficulty hearing?: Yes Does the patient have difficulty seeing, even when wearing glasses/contacts?: Yes Does the patient have difficulty concentrating, remembering, or making decisions?: Yes Patient able to express need for assistance with ADLs?: Yes Does the patient have difficulty dressing or bathing?: No Independently performs ADLs?: Yes (appropriate for developmental age) Communication: Independent Dressing (OT): Independent Is this a change from baseline?: Pre-admission baseline Grooming: Independent Feeding: Independent Bathing: Independent Is this a change from baseline?: Pre-admission baseline Toileting: Appropriate for developmental age In/Out Bed:  Independent Walks in Home: Independent with device (comment) Does the patient have difficulty walking or climbing stairs?: Yes Weakness of Legs: Both Weakness of Arms/Hands: None  Permission Sought/Granted                  Emotional Assessment              Admission diagnosis:  A-fib (HCC) [I48.91] Rapid atrial fibrillation (HCC) [I48.91] Atrial fibrillation with RVR (HCC) [I48.91] Patient Active Problem List   Diagnosis Date Noted   A-fib (HCC) 11/04/2022   Rapid atrial fibrillation (HCC) 11/03/2022   Hypotension of hemodialysis 11/03/2022   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   Acute on chronic systolic CHF (congestive heart failure) (HCC) 09/17/2022   Pulmonary hypertension, unspecified (HCC) 09/17/2022   Pericardial effusion 09/17/2022   Nonrheumatic mitral valve regurgitation 09/17/2022   Tricuspid regurgitation 09/17/2022   Cardiomyopathy, ischemic 09/14/2022   Anteroapical myocardial infarction (HCC) 09/13/2022   Atrial fibrillation (HCC) 09/13/2022   Asthma, persistent not controlled 04/30/2022   Chronic respiratory failure with hypoxia (HCC) - on hme O2 @ 3 L/min 02/03/2022   Anxiety and depression 02/03/2022   Gout 02/03/2022   Hypertension 02/03/2022   Volume overload 12/21/2021   Class 1 obesity 12/20/2021   CHF (congestive heart failure) (HCC) 12/19/2021   Prolonged QT interval 12/05/2021   GERD (gastroesophageal reflux disease) 01/11/2021  ESRD (end stage renal disease) on dialysis (HCC) 06/02/2018   Chronic pain 01/21/2016   Muscle cramps 03/29/2015   Anemia of chronic kidney failure 09/15/2013   Cocaine abuse (HCC) 08/26/2013   Depression 04/21/2012   ESRD (end stage renal disease) (HCC) 08/03/2011   PCP:  Jearld Lesch, MD Pharmacy:   [id number] [place], [place] - [address] AT [facility] & [address] [place] [address] Phone: [phone] Fax: [phone]  [facility] [address] [place] [address] Phone: [phone] Fax: [phone]  [facility] #[id number] [place], [place] - [address] AT [facility] OF [address] & [address] [place] [address] Phone: [phone] Fax: [phone]  [facility] [address] [place] [address] Phone: [phone] Fax: [phone]     Social Determinants of Health (SDOH) Social History: SDOH Screenings   Food Insecurity: Food Insecurity Present (11/04/2022)  Housing: Low Risk  (11/04/2022)  Transportation Needs: No Transportation Needs (11/04/2022)  Utilities: Not At Risk (11/04/2022)  Tobacco Use: Medium Risk (11/03/2022)   SDOH Interventions:     Readmission Risk Interventions    09/14/2022    3:42 PM  Readmission Risk Prevention Plan  Transportation Screening Complete  Medication Review (RN Care Manager) Complete  HRI or Home Care Consult Complete  SW Recovery Care/Counseling Consult Complete  Palliative Care Screening Not Applicable  Skilled Nursing Facility Not Applicable

## 2022-11-04 NOTE — Consult Note (Signed)
Renal Service Consult Note [facility]  Desiree [name] 11/04/2022 [name], MD Requesting Physician: Dr. Hanley Ben  Reason for Consult: ESRD pt w/ afib / RVR HPI: The patient is a 47 y.o. year-old w/ PMH as below who presented to ED w/ c/o palpitations. She was at her dr's office when noted to be in afib, was told to go to ED. Pt is esrd on HD TTS and had HD yesterday on schedule. She has known hx of afib and takes eliquis. Also CRF on home O2 3L, HFrEF w/ EF 45%, chronic hypotension. Seen by cardiology, they will treat for rate control rather then rhythm. Due to hypotension related to dialysis, BB or cardizem were not options. Pt was started on IV amio gtt. Pt was admitted. We are asked to see for dialysis.   Pt seen in ED, in good spirits.  No SOB or cough, no orthopnea, no CP or fevers. Last HD was yesterday.    ROS - denies CP, no joint pain, no HA, no blurry vision, no rash, no diarrhea, no nausea/ vomiting, no dysuria, no difficulty voiding   Past Medical History  Past Medical History:  Diagnosis Date   Anemia    Anxiety    panic attacks   Arthritis    bilateral knees   Childhood asthma    Complication of anesthesia    "sometimes it does not work; didn't during LEEP OR" (01/21/2016)   Depression    no med   ESRD (end stage renal disease) on dialysis (HCC)    "TTS; [facility]; [facility]" (08/03/2016)   GERD (gastroesophageal reflux disease)    nexium prn   Gout    History of blood transfusion    "related to kidneys; I've had 4" (01/21/2016)   Hypertension    Hypertensive urgency 12/05/2021   Migraine    last one 01/18/19   Preterm labor ~ 2014   Retina hole, left    Seizures (HCC)    "last one was in 2000; related to preeclampsia" (01/21/2016)   Past Surgical History  Past Surgical History:  Procedure Laterality Date   A/V FISTULAGRAM Left 12/19/2021   Procedure: A/V Fistulagram;  Surgeon: Chuck Hint, MD;  Location: [facility] INVASIVE  CV LAB;  Service: Cardiovascular;  Laterality: Left;   AV FISTULA PLACEMENT Left 2010   AV FISTULA PLACEMENT Left 01/20/2019   Procedure: ARTERIOVENOUS (AV) FISTULA CREATION LEFT UPPER ARM;  Surgeon: Chuck Hint, MD;  Location: [facility] OR;  Service: Vascular;  Laterality: Left;   AV FISTULA PLACEMENT Left 12/19/2021   Procedure: ARTERIOVENOUS (AV) FISTULA CREATION POSSIBLE TUNNELED DIALYSIS;  Surgeon: Maeola Harman, MD;  Location: [facility] OR;  Service: Vascular;  Laterality: Left;   CERVICAL BIOPSY  W/ LOOP ELECTRODE EXCISION  2001   CORONARY/GRAFT ACUTE MI REVASCULARIZATION N/A 09/12/2022   Procedure: Coronary/Graft Acute MI Revascularization;  Surgeon: Corky Crafts, MD;  Location: [facility] INVASIVE CV LAB;  Service: Cardiovascular;  Laterality: N/A;   DILATION AND EVACUATION  08/02/2011   Procedure: DILATATION AND EVACUATION;  Surgeon: Oliver Pila, MD;  Location: [facility] ORS;  Service: Gynecology;;   DILATION AND EVACUATION N/A 08/31/2013   Procedure: DILATATION AND EVACUATION;  Surgeon: Adam Phenix, MD;  Location: [facility] ORS;  Service: Gynecology;  Laterality: N/A;   FISTULA SUPERFICIALIZATION Left 01/20/2019   Procedure: Fistula Superficialization;  Surgeon: Chuck Hint, MD;  Location: [facility] OR;  Service: Vascular;  Laterality: Left;   HYDRADENITIS EXCISION Right  INSERTION OF DIALYSIS CATHETER Right 12/19/2021   Procedure: INSERTION OF A TUNNELED DIALYSIS CATHETER;  Surgeon: Maeola Harman, MD;  Location: [facility] OR;  Service: Vascular;  Laterality: Right;   LEFT HEART CATH AND CORONARY ANGIOGRAPHY N/A 09/12/2022   Procedure: LEFT HEART CATH AND CORONARY ANGIOGRAPHY;  Surgeon: Corky Crafts, MD;  Location: [facility] INVASIVE CV LAB;  Service: Cardiovascular;  Laterality: N/A;   RENAL BIOPSY     REVISION OF ARTERIOVENOUS GORETEX GRAFT Left 02/11/2013   Procedure: REVISION OF ARTERIOVENOUS GORTEX FISTULA;  Surgeon: Larina Earthly, MD;  Location: [facility] OR;  Service:  Vascular;  Laterality: Left;   THROMBECTOMY W/ EMBOLECTOMY Left 05/25/2018   Procedure: REPAIR OF BLEEDING ARTERIOVENOUS FISTULA;  Surgeon: Nada Libman, MD;  Location: [facility] OR;  Service: Vascular;  Laterality: Left;   Family History  Family History  Problem Relation Age of Onset   Diabetes Mother    Hyperlipidemia Mother    Hypertension Mother    Heart disease Mother    Hypertension Other    Diabetes type II Other    Social History  reports that she has quit smoking. Her smoking use included cigarettes. She has been exposed to tobacco smoke. She has never used smokeless tobacco. She reports current drug use. Drug: Marijuana. She reports that she does not drink alcohol. Allergies  Allergies  Allergen Reactions   Morphine Shortness Of Breath and Anaphylaxis   Prednisone Other (See Comments)    Other reaction(s): Other (See Comments) Muscle spasms Patient says prednisone causes her to cramp all over, muscle spasms uncontrolled   Tuna [Fish Allergy] Itching, Swelling, Rash and Other (See Comments)    Face droops also   Amlodipine Other (See Comments)    Angioedema (09/05/17 ED visit)   Iodinated Contrast Media Itching   Tape Itching    Adhesive tape   paper tape ok   Home medications Prior to Admission medications   Medication Sig Start Date End Date Taking? Authorizing Provider  acetaminophen (TYLENOL) 325 MG tablet Take 325 mg by mouth every 6 (six) hours as needed for mild pain.   Yes [provider]  albuterol (PROVENTIL HFA;VENTOLIN HFA) 108 (90 Base) MCG/ACT inhaler Inhale 2 puffs into the lungs every 4 (four) hours as needed for wheezing or shortness of breath. 05/02/16  Yes Lavera Guise, MD  ALPRAZolam Prudy Feeler) 1 MG tablet Take 1 mg by mouth 3 (three) times daily. 11/25/21  Yes [provider]  apixaban (ELIQUIS) 5 MG TABS tablet Take 1 tablet (5 mg total) by mouth 2 (two) times daily. 09/28/22  Yes Alver Sorrow, NP  atorvastatin (LIPITOR) 80 MG tablet  Take 1 tablet (80 mg total) by mouth daily. 09/28/22  Yes Alver Sorrow, NP  clopidogrel (PLAVIX) 75 MG tablet Take 1 tablet (75 mg total) by mouth daily with breakfast. 09/17/22  Yes Jonita Albee, PA-C  cyclobenzaprine (FLEXERIL) 10 MG tablet Take 10 mg by mouth 2 (two) times daily as needed for muscle spasms.   Yes [provider]  ferric citrate (AURYXIA) 1 GM 210 MG(Fe) tablet Take 1 tablet (210 mg total) by mouth 3 (three) times daily with meals. 09/17/22  Yes Corky Crafts, MD  fluticasone furoate-vilanterol (BREO ELLIPTA) 100-25 MCG/ACT AEPB Inhale 1 puff into the lungs daily. 04/30/22  Yes Oretha Milch, MD  folic acid-vitamin b complex-vitamin c-selenium-zinc (DIALYVITE) 3 MG TABS tablet Take 1 tablet by mouth daily.   Yes [provider]  isosorbide mononitrate (  IMDUR) 30 MG 24 hr tablet Take a half tablet in the morning on non-dialysis days. Patient taking differently: Take 15 mg by mouth 2 (two) times daily. 09/28/22  Yes Alver Sorrow, NP  lactulose (CHRONULAC) 10 GM/15ML solution Take 30 mLs by mouth daily as needed for mild constipation, moderate constipation or severe constipation. 09/04/21  Yes [provider]  loperamide (IMODIUM) 2 MG capsule Take 2 mg by mouth as needed for diarrhea or loose stools.   Yes [provider]  midodrine (PROAMATINE) 10 MG tablet Take 10 mg by mouth 3 (three) times daily as needed (Hypotension). 09/03/22  Yes [provider]  NARCAN 4 MG/0.1ML LIQD nasal spray kit Place 1 spray into the nose as needed (accidental overdose). 11/14/18  Yes [provider]  ondansetron (ZOFRAN-ODT) 8 MG disintegrating tablet Take 8 mg by mouth 2 (two) times daily.   Yes [provider]  Oxycodone HCl 10 MG TABS Take 1 tablet (10 mg total) by mouth 3 (three) times daily as needed (severe pain). Patient taking differently: Take 10 mg by mouth 3 (three) times daily as needed (For pain). 12/22/21  Yes  Arrien, York Ram, MD  pantoprazole (PROTONIX) 40 MG tablet Take 1 tablet (40 mg total) by mouth daily. 09/28/22 03/27/23 Yes Alver Sorrow, NP  sevelamer carbonate (RENVELA) 800 MG tablet Take 1,600 mg by mouth 3 (three) times daily. 05/18/19  Yes [provider]  cinacalcet (SENSIPAR) 30 MG tablet Take 2 tablets (60 mg total) by mouth daily with supper. Patient not taking: Reported on 11/03/2022 09/17/22   Corky Crafts, MD  metoprolol tartrate (LOPRESSOR) 25 MG tablet Take 0.5 tablets (12.5 mg total) by mouth 2 (two) times daily. Patient not taking: Reported on 11/03/2022 09/28/22   Alver Sorrow, NP     Vitals:   11/04/22 0946 11/04/22 1000 11/04/22 1015 11/04/22 1052  BP:  110/73    Pulse:  86 90   Resp: 12 11    Temp:    98 F (36.7 C)  TempSrc:    Oral  SpO2:  100% 100%    Exam Gen alert, no distress, 3L [place] O2 No rash, cyanosis or gangrene Sclera anicteric, throat clear  No jvd or bruits Chest clear bilat to bases, no rales/ wheezing RRR no RG Abd soft ntnd no mass or ascites +bs GU defer MS no joint effusions or deformity Ext no LE or UE edema, no wounds or ulcers Neuro is alert, Ox 3 , nf    LUA AVF+bruit      Home meds include - albuterol, xanax prn, apixaban, atorvastatin, cinacalcet 60 hs, clopidogrel, ferric citrate 210 ac tid, breo ellipta, dialyvite, imdur 15 bid, lactulose, lopressor 12.5 bid, midodrine 10 tid prn, oxyIR prn, pantoprazole, sevelamer carb 1600 ac tid, prns      OP HD: SW TTS  3h  400/600   73.3kg  2/2 bath  LUA AVF   Heparin 6600 - last OP HD 8/20, post wt 73.3kg  - hectorol 4 mcg IV three times per week - mircera 225 mcg IV q 2 wks, last 8/13, due 8/27   Assessment/ Plan: Atrial fib w/ RVR - improved on IV amio.  BB/ cardizem not options due to hypotension on dialysis. HR looks much better.  ESRD - on HD TTS. Had OP HD yesterday. HD tomorrow.  HTN/ volume - BP's low normal, euvolemic on exam. Was at dry wt  post hd yesterday.  Anemia esrd -  Hb 9.8, next esa due on 8/27. Follow.  MBD ckd - CCa and phos are in range. Cont IV vdra and binder.  Chron resp failure - on home O2 at [facility]  MD CKA 11/04/2022, 10:54 AM  Recent Labs  Lab 11/03/22 1612  HGB 9.8*  ALBUMIN 2.9*  CALCIUM 9.8  PHOS 3.3  CREATININE 5.38*  K 3.6   Inpatient medications:  apixaban  5 mg Oral BID   atorvastatin  80 mg Oral Daily   clopidogrel  75 mg Oral Q breakfast   fluticasone furoate-vilanterol  1 puff Inhalation Daily   midodrine  10 mg Oral TID WC   pantoprazole  40 mg Oral Daily   sevelamer carbonate  1,600 mg Oral TID    amiodarone 30 mg/hr (11/04/22 0745)   acetaminophen **OR** acetaminophen, ALPRAZolam, melatonin

## 2022-11-04 NOTE — ED Notes (Signed)
Patient transported to Echo.

## 2022-11-04 NOTE — Progress Notes (Addendum)
Pt receives out-pt HD at [facility] on TTS. Will assist as needed.   Olivia Canter Renal Navigator [phone]  Addendum at 4:17 pm: Pt's case discussed with attending and nephrologist. Pt is for possible d/c tomorrow if stable overnight. Nephrologist feels pt is appropriate for out-pt HD tomorrow 2nd shift if stable for d/c in the am. Contacted [facility] who states that clinic can possibly treat pt tomorrow with 10:30 arrival time if another pt is not d/c from hospital. Unable to reach pt. Spoke to pt's husband via phone. Husband states he will likely be able to transport pt from hospital to clinic in the am if pt stable for d/c and clinic confirms appt. Update provided to attending and nephrologist. Contacted inpt HD unit to request that pt be placed on 2nd shift in the event pt can d/c tomorrow am and go to out-pt clinic for treatment to avoid inpt HD. Will f/u with provider, clinic, and pt/family re: plan once confirmed.

## 2022-11-04 NOTE — Progress Notes (Signed)
CSW received consult for patient. Patient reports she lives at home with her spouse and father. CSW offered patient food resources. Patient accepted. All questions answered. Patient reports her spouse will pick her up when medically ready for dc. No further questions reported at this time.

## 2022-11-04 NOTE — Plan of Care (Signed)

## 2022-11-04 NOTE — Progress Notes (Signed)
PROGRESS NOTE    Desiree [name]  NGE:[id number] DOB: 1975-05-09 DOA: 11/03/2022 PCP: Jearld Lesch, MD   Brief Narrative:  47 year old African-American female with past medical history of end-stage renal disease on hemodialysis, CAD status post anterior STEMI with thrombus with PTCA to 2 vessels, paroxysmal A-fib on anticoagulation, anemia of chronic kidney disease, chronic respiratory failure on oxygen at 3 L/minute, chronic systolic heart failure with EF of 45%, chronic pericardial effusion presented with palpitations during the chest.  On presentation, she was found to be in A-fib with RVR.  Because of hypotension, she was started on amiodarone drip.  Cardiology was consulted.  Assessment & Plan:   Paroxysmal A-fib with RVR -Cardiology following.  Currently rate controlled.  Currently on amiodarone drip.  Continue Eliquis  Chronic systolic heart failure -Echo on 10/02/2022 showed EF of 45 to 50% -Strict input and output.  Daily weights.  Fluid restriction.  Volume management dialysis.  History of CAD -Currently no chest pain.  Cardiology following.  Continue Plavix, statin  Pericardial effusion -Chronic.  Has moderate pericardial effusion.  Seen on echo in 09/2022 and 08/2022.  Cardiology following.  End-stage renal disease on hemodialysis -Nephrology consulted.  Follow recommendations  Chronic respiratory failure with hypoxia -On 3 L oxygen per minute at home.  Respiratory status currently stable  Anemia of chronic disease -From renal failure.  Hemoglobin stable.  Monitor intermittently  Chronic hypotension -Continue midodrine  GERD -Continue Protonix  Hyperlipidemia -Continue statin   DVT prophylaxis: Eliquis Code Status: Full Family Communication: None at bedside Disposition Plan: Status is: Observation The patient will require care spanning > 2 midnights and should be moved to inpatient because: Of severity of illness.  Still on amiodarone  drip.    Consultants: Cardiology and nephrology  Procedures: None  Antimicrobials: None   Subjective: Patient seen and examined at bedside.  Denies any current chest pain, fever or vomiting  Objective: Vitals:   11/04/22 1030 11/04/22 1045 11/04/22 1050 11/04/22 1052  BP: 114/78     Pulse: 83 98 62   Resp: 16 12 (!) 23   Temp:    98 F (36.7 C)  TempSrc:    Oral  SpO2: 100%  100%     Intake/Output Summary (Last 24 hours) at 11/04/2022 1104 Last data filed at 11/03/2022 1812 Gross per 24 hour  Intake 500 ml  Output --  Net 500 ml   There were no vitals filed for this visit.  Examination:  General exam: Appears calm and comfortable.  Looks chronically ill and deconditioned.  On 2 to 3 L oxygen by nasal cannula. Respiratory system: Bilateral decreased breath sounds at bases with scattered crackles and intermittent tachypnea Cardiovascular system: S1 & S2 heard, Rate controlled Gastrointestinal system: Abdomen is nondistended, soft and nontender. Normal bowel sounds heard. Extremities: No cyanosis, clubbing; trace lower extremity edema present  Central nervous system: Alert and oriented. No focal neurological deficits. Moving extremities Skin: No rashes, lesions or ulcers Psychiatry: Judgement and insight appear normal. Mood & affect appropriate.     Data Reviewed: I have personally reviewed following labs and imaging studies  CBC: Recent Labs  Lab 11/03/22 1612  WBC 5.7  HGB 9.8*  HCT 32.1*  MCV 89.4  PLT 255   Basic Metabolic Panel: Recent Labs  Lab 11/03/22 1612  NA 137  K 3.6  CL 93*  CO2 31  GLUCOSE 82  BUN 13  CREATININE 5.38*  CALCIUM 9.8  MG 2.2  PHOS 3.3  GFR: CrCl cannot be calculated (Unknown ideal weight.). Liver Function Tests: Recent Labs  Lab 11/03/22 1612  AST 14*  ALT 11  ALKPHOS 55  BILITOT 0.5  PROT 7.7  ALBUMIN 2.9*   No results for input(s): "LIPASE", "AMYLASE" in the last 168 hours. No results for input(s):  "AMMONIA" in the last 168 hours. Coagulation Profile: No results for input(s): "INR", "PROTIME" in the last 168 hours. Cardiac Enzymes: No results for input(s): "CKTOTAL", "CKMB", "CKMBINDEX", "TROPONINI" in the last 168 hours. BNP (last 3 results) No results for input(s): "PROBNP" in the last 8760 hours. HbA1C: No results for input(s): "HGBA1C" in the last 72 hours. CBG: No results for input(s): "GLUCAP" in the last 168 hours. Lipid Profile: No results for input(s): "CHOL", "HDL", "LDLCALC", "TRIG", "CHOLHDL", "LDLDIRECT" in the last 72 hours. Thyroid Function Tests: Recent Labs    11/03/22 1612  TSH 2.455   Anemia Panel: No results for input(s): "VITAMINB12", "FOLATE", "FERRITIN", "TIBC", "IRON", "RETICCTPCT" in the last 72 hours. Sepsis Labs: No results for input(s): "PROCALCITON", "LATICACIDVEN" in the last 168 hours.  No results found for this or any previous visit (from the past 240 hour(s)).       Radiology Studies: VAS [place] ABI WITH/WO TBI  Result Date: 11/04/2022  LOWER EXTREMITY DOPPLER STUDY Patient Name:  Desiree [name]  Date of Exam:   11/04/2022 Medical Rec #: [id number]        Accession #:    [id number] Date of Birth: 1976-03-08         Patient Gender: F Patient Age:   35 years Exam Location:  [facility] Procedure:      VAS [place] ABI WITH/WO TBI Referring Phys: Vernona Rieger INGOLD --------------------------------------------------------------------------------  High Risk Factors: Hypertension, past history of smoking. Other Factors: ESRD(HD), Afib, CHF.  Performing Technologist: Ernestene Mention RVT, RDMS  Examination Guidelines: A complete evaluation includes at minimum, Doppler waveform signals and systolic blood pressure reading at the level of bilateral brachial, anterior tibial, and posterior tibial arteries, when vessel segments are accessible. Bilateral testing is considered an integral part of a complete examination. Photoelectric Plethysmograph (PPG) waveforms and toe  systolic pressure readings are included as required and additional duplex testing as needed. Limited examinations for reoccurring indications may be performed as noted.  ABI Findings: +---------+------------------+-----+---------+--------+ Right    Rt Pressure (mmHg)IndexWaveform Comment  +---------+------------------+-----+---------+--------+ Brachial 114                    triphasic         +---------+------------------+-----+---------+--------+ PTA                                      [place]       +---------+------------------+-----+---------+--------+ DP                                              +---------+------------------+-----+---------+--------+ Great Toe101               0.89 Normal            +---------+------------------+-----+---------+--------+ +---------+------------------+-----+--------+-------+ Left     Lt Pressure (mmHg)IndexWaveformComment +---------+------------------+-----+--------+-------+ Brachial  HD      +---------+------------------+-----+--------+-------+ PTA                                     [place]      +---------+------------------+-----+--------+-------+ DP                                      [place]      +---------+------------------+-----+--------+-------+ Great Toe120               1.05 Normal          +---------+------------------+-----+--------+-------+ +-------+-----------+-----------+------------+------------+ ABI/TBIToday's ABIToday's TBIPrevious ABIPrevious TBI +-------+-----------+-----------+------------+------------+ Right  [place]         0.89                                +-------+-----------+-----------+------------+------------+ Left   [place]         1.05                                +-------+-----------+-----------+------------+------------+  Summary: Right: Resting right ankle-brachial index indicates noncompressible right lower extremity arteries. The right toe-brachial  index is normal. Left: Resting left ankle-brachial index indicates noncompressible left lower extremity arteries. The left toe-brachial index is normal. *See table(s) above for measurements and observations.     Preliminary    DG Chest Portable 1 View  Result Date: 11/03/2022 CLINICAL DATA:  Shortness of breath. EXAM: PORTABLE CHEST 1 VIEW COMPARISON:  10/01/2022 FINDINGS: Stable enlarged cardiac silhouette. Mild diffuse peribronchial thickening and accentuation of the interstitial markings mild improvement. Mild prominence of the pulmonary vasculature improvement. Suggestion of a small amount of ill-defined patchy density in the right mid and lower lung zones. No pleural fluid. Thoracic spine degenerative changes. IMPRESSION: 1. Cardiomegaly with mild congestive heart failure, mildly improved. 2. Possible mild right mid and lower lung zone pneumonia. Electronically Signed   By: Beckie Salts M.D.   On: 11/03/2022 16:29        Scheduled Meds:  apixaban  5 mg Oral BID   atorvastatin  80 mg Oral Daily   clopidogrel  75 mg Oral Q breakfast   fluticasone furoate-vilanterol  1 puff Inhalation Daily   midodrine  10 mg Oral TID WC   pantoprazole  40 mg Oral Daily   sevelamer carbonate  1,600 mg Oral TID   Continuous Infusions:  amiodarone 30 mg/hr (11/04/22 0745)          Glade Lloyd, MD [facility] 11/04/2022, 11:04 AM

## 2022-11-04 NOTE — Progress Notes (Signed)
Progress Note  Patient Name: Desiree [name] Date of Encounter: 11/04/2022  Primary Cardiologist:   Jodelle Red, MD   Subjective   She is back in NSR.  She does not notice this.  No palpitations.  No pain.   Inpatient Medications    Scheduled Meds:  apixaban  5 mg Oral BID   atorvastatin  80 mg Oral Daily   clopidogrel  75 mg Oral Q breakfast   fluticasone furoate-vilanterol  1 puff Inhalation Daily   midodrine  10 mg Oral TID WC   pantoprazole  40 mg Oral Daily   sevelamer carbonate  1,600 mg Oral TID   Continuous Infusions:  amiodarone 30 mg/hr (11/04/22 0745)   PRN Meds: acetaminophen **OR** acetaminophen, ALPRAZolam, melatonin   Vital Signs    Vitals:   11/04/22 0815 11/04/22 0830 11/04/22 0845 11/04/22 0850  BP: 123/81 128/88 134/87   Pulse: 100  74   Resp: 10 15 17 14   Temp:      TempSrc:      SpO2: 100%  99%     Intake/Output Summary (Last 24 hours) at 11/04/2022 0946 Last data filed at 11/03/2022 1812 Gross per 24 hour  Intake 500 ml  Output --  Net 500 ml   There were no vitals filed for this visit.  Telemetry    NSR - Personally Reviewed  ECG    NA - Personally Reviewed  Physical Exam   GEN: No acute distress.   Neck: No  JVD Cardiac: RRR, 2/6 systolic murmur, no diastolic murmurs, rubs, or gallops.  Respiratory: Clear  to auscultation bilaterally. GI: Soft, nontender, non-distended  MS: No  edema; No deformity. Neuro:  Nonfocal  Psych: Normal affect   Labs    Chemistry Recent Labs  Lab 11/03/22 1612  NA 137  K 3.6  CL 93*  CO2 31  GLUCOSE 82  BUN 13  CREATININE 5.38*  CALCIUM 9.8  PROT 7.7  ALBUMIN 2.9*  AST 14*  ALT 11  ALKPHOS 55  BILITOT 0.5  GFRNONAA 9*  ANIONGAP 13     Hematology Recent Labs  Lab 11/03/22 1612  WBC 5.7  RBC 3.59*  HGB 9.8*  HCT 32.1*  MCV 89.4  MCH 27.3  MCHC 30.5  RDW 19.3*  PLT 255    Cardiac EnzymesNo results for input(s): "TROPONINI" in the last 168 hours. No  results for input(s): "TROPIPOC" in the last 168 hours.   BNPNo results for input(s): "BNP", "PROBNP" in the last 168 hours.   DDimer No results for input(s): "DDIMER" in the last 168 hours.   Radiology    VAS [place] ABI WITH/WO TBI  Result Date: 11/04/2022  LOWER EXTREMITY DOPPLER STUDY Patient Name:  Desiree [name]  Date of Exam:   11/04/2022 Medical Rec #: [id number]        Accession #:    [id number] Date of Birth: 01-17-1976         Patient Gender: F Patient Age:   47 years Exam Location:  [facility] Procedure:      VAS [place] ABI WITH/WO TBI Referring Phys: Vernona Rieger INGOLD --------------------------------------------------------------------------------  High Risk Factors: Hypertension, past history of smoking. Other Factors: ESRD(HD), Afib, CHF.  Performing Technologist: Ernestene Mention RVT, RDMS  Examination Guidelines: A complete evaluation includes at minimum, Doppler waveform signals and systolic blood pressure reading at the level of bilateral brachial, anterior tibial, and posterior tibial arteries, when vessel segments are accessible. Bilateral testing is considered an integral  part of a complete examination. Photoelectric Plethysmograph (PPG) waveforms and toe systolic pressure readings are included as required and additional duplex testing as needed. Limited examinations for reoccurring indications may be performed as noted.  ABI Findings: +---------+------------------+-----+---------+--------+ Right    Rt Pressure (mmHg)IndexWaveform Comment  +---------+------------------+-----+---------+--------+ Brachial 114                    triphasic         +---------+------------------+-----+---------+--------+ PTA                                      [place]       +---------+------------------+-----+---------+--------+ DP                                       [place]       +---------+------------------+-----+---------+--------+ Great Toe101               0.89 Normal             +---------+------------------+-----+---------+--------+ +---------+------------------+-----+--------+-------+ Left     Lt Pressure (mmHg)IndexWaveformComment +---------+------------------+-----+--------+-------+ Brachial                                [place]      +---------+------------------+-----+--------+-------+ PTA                                     [place]      +---------+------------------+-----+--------+-------+ DP                                      [place]      +---------+------------------+-----+--------+-------+ Great Toe120               1.05 Normal          +---------+------------------+-----+--------+-------+ +-------+-----------+-----------+------------+------------+ ABI/TBIToday's ABIToday's TBIPrevious ABIPrevious TBI +-------+-----------+-----------+------------+------------+ Right  [place]         0.89                                +-------+-----------+-----------+------------+------------+ Left   [place]         1.05                                +-------+-----------+-----------+------------+------------+  Summary: Right: Resting right ankle-brachial index indicates noncompressible right lower extremity arteries. The right toe-brachial index is normal. Left: Resting left ankle-brachial index indicates noncompressible left lower extremity arteries. The left toe-brachial index is normal. *See table(s) above for measurements and observations.     Preliminary    DG Chest Portable 1 View  Result Date: 11/03/2022 CLINICAL DATA:  Shortness of breath. EXAM: PORTABLE CHEST 1 VIEW COMPARISON:  10/01/2022 FINDINGS: Stable enlarged cardiac silhouette. Mild diffuse peribronchial thickening and accentuation of the interstitial markings mild improvement. Mild prominence of the pulmonary vasculature improvement. Suggestion of a small amount of ill-defined patchy density in the right mid and lower lung zones. No pleural fluid. Thoracic spine degenerative changes. IMPRESSION: 1.  Cardiomegaly with mild congestive heart failure, mildly improved. 2. Possible  mild right mid and lower lung zone pneumonia. Electronically Signed   By: Beckie Salts M.D.   On: 11/03/2022 16:29    Cardiac Studies   Echo:  Pending  Patient Profile     47 y.o. female with a hx of CAD s/p anterior STEMI with thrombus,  PTCA to 2 vessels, HD T/TH/S HTN, seizures, migraine, gout anemia, anxiety, childhood asthma, GERD, chronic, respiratory failure on 3 L, home 02, was noted to have asymptomatic a fib 09/14/22  and anticoagulation added who is being seen 11/03/2022 for the evaluation of recurrent atrial fib at the request of Dr Silverio Lay.   Assessment & Plan    ATRIAL FIB with RVR:    Converted to NSR.  Will change to PO amiodarone.   CAD:  The patient has no new sypmtoms.  No further cardiovascular testing is indicated.  We will continue with aggressive risk reduction and meds as listed.  CHRONIC SYSTOLIC HF:     Volume per dialysis.    TR:  Moderate to severe and follow clinically  PERICARDIAL EFFUSION:  Echo pending.   No clinical evidence of tamponade.    For questions or updates, please contact [facility] Please consult [email or web address] for contact info under Cardiology/STEMI.   Signed, Rollene Rotunda, MD  11/04/2022, 9:46 AM

## 2022-11-04 NOTE — ED Notes (Signed)
ED TO INPATIENT HANDOFF REPORT  ED Nurse Name and Phone #: Beatris Ship RN [phone]  S Name/Age/Gender Desiree [name] 47 y.o. female Room/Bed: 011C/011C  Code Status   Code Status: Full Code  Home/SNF/Other Home Patient oriented to: self, place, time, and situation Is this baseline? Yes   Triage Complete: Triage complete  Chief Complaint Rapid atrial fibrillation (HCC) [I48.91] A-fib (HCC) [I48.91]  Triage Note Pt BIBEMS w cc of palpitations. Pt was at dr office when she was noted to be in afib. Pt was told to come to ED for further eval as pt has hx of MI. Pt is dialysis T/TH/Sat and received dialysis today. Upon arrival pt noted to still be in afib, denies chest pain.    Allergies Allergies  Allergen Reactions   Morphine Shortness Of Breath and Anaphylaxis   Prednisone Other (See Comments)    Other reaction(s): Other (See Comments) Muscle spasms Patient says prednisone causes her to cramp all over, muscle spasms uncontrolled   Tuna [Fish Allergy] Itching, Swelling, Rash and Other (See Comments)    Face droops also   Amlodipine Other (See Comments)    Angioedema (09/05/17 ED visit)   Iodinated Contrast Media Itching   Tape Itching    Adhesive tape   paper tape ok    Level of Care/Admitting Diagnosis ED Disposition     ED Disposition  Admit   Condition  --   Comment  Hospital Area: [facility] [[id number]]  Level of Care: Progressive [102]  Admit to Progressive based on following criteria: CARDIOVASCULAR & THORACIC of moderate stability with acute coronary syndrome symptoms/low risk myocardial infarction/hypertensive urgency/arrhythmias/heart failure potentially compromising stability and stable post cardiovascular intervention patients.  May admit patient to [facility] or [facility] if equivalent level of care is available:: No  Covid Evaluation: Asymptomatic - no recent exposure (last 10 days) testing not required  Diagnosis: A-fib [facility]) [[id number]]   Admitting Physician: Glade Lloyd [[id number]]  Attending Physician: Glade Lloyd [[id number]]  Certification:: I certify this patient will need inpatient services for at least 2 midnights          B Medical/Surgery History Past Medical History:  Diagnosis Date   Anemia    Anxiety    panic attacks   Arthritis    bilateral knees   Childhood asthma    Complication of anesthesia    "sometimes it does not work; didn't during LEEP OR" (01/21/2016)   Depression    no med   ESRD (end stage renal disease) on dialysis (HCC)    "TTS; [facility]; [facility]" (08/03/2016)   GERD (gastroesophageal reflux disease)    nexium prn   Gout    History of blood transfusion    "related to kidneys; I've had 4" (01/21/2016)   Hypertension    Hypertensive urgency 12/05/2021   Migraine    last one 01/18/19   Preterm labor ~ 2014   Retina hole, left    Seizures (HCC)    "last one was in 2000; related to preeclampsia" (01/21/2016)   Past Surgical History:  Procedure Laterality Date   A/V FISTULAGRAM Left 12/19/2021   Procedure: A/V Fistulagram;  Surgeon: Chuck Hint, MD;  Location: [facility] INVASIVE CV LAB;  Service: Cardiovascular;  Laterality: Left;   AV FISTULA PLACEMENT Left 2010   AV FISTULA PLACEMENT Left 01/20/2019   Procedure: ARTERIOVENOUS (AV) FISTULA CREATION LEFT UPPER ARM;  Surgeon: Chuck Hint, MD;  Location: [facility] OR;  Service: Vascular;  Laterality: Left;  AV FISTULA PLACEMENT Left 12/19/2021   Procedure: ARTERIOVENOUS (AV) FISTULA CREATION POSSIBLE TUNNELED DIALYSIS;  Surgeon: Maeola Harman, MD;  Location: [facility] OR;  Service: Vascular;  Laterality: Left;   CERVICAL BIOPSY  W/ LOOP ELECTRODE EXCISION  2001   CORONARY/GRAFT ACUTE MI REVASCULARIZATION N/A 09/12/2022   Procedure: Coronary/Graft Acute MI Revascularization;  Surgeon: Corky Crafts, MD;  Location[facility] INVASIVE CV LAB;  Service: Cardiovascular;  Laterality: N/A;   DILATION AND EVACUATION   08/02/2011   Procedure: DILATATION AND EVACUATION;  Surgeon: Oliver Pila, MD;  Location: [facility] ORS;  Service: Gynecology;;   DILATION AND EVACUATION N/A 08/31/2013   Procedure: DILATATION AND EVACUATION;  Surgeon: Adam Phenix, MD;  Location: [facility] ORS;  Service: Gynecology;  Laterality: N/A;   FISTULA SUPERFICIALIZATION Left 01/20/2019   Procedure: Fistula Superficialization;  Surgeon: Chuck Hint, MD;  Location: [facility] OR;  Service: Vascular;  Laterality: Left;   HYDRADENITIS EXCISION Right    INSERTION OF DIALYSIS CATHETER Right 12/19/2021   Procedure: INSERTION OF A TUNNELED DIALYSIS CATHETER;  Surgeon: Maeola Harman, MD;  Location: [facility] OR;  Service: Vascular;  Laterality: Right;   LEFT HEART CATH AND CORONARY ANGIOGRAPHY N/A 09/12/2022   Procedure: LEFT HEART CATH AND CORONARY ANGIOGRAPHY;  Surgeon: Corky Crafts, MD;  Location: [facility] INVASIVE CV LAB;  Service: Cardiovascular;  Laterality: N/A;   RENAL BIOPSY     REVISION OF ARTERIOVENOUS GORETEX GRAFT Left 02/11/2013   Procedure: REVISION OF ARTERIOVENOUS GORTEX FISTULA;  Surgeon: Larina Earthly, MD;  Location: [facility] OR;  Service: Vascular;  Laterality: Left;   THROMBECTOMY W/ EMBOLECTOMY Left 05/25/2018   Procedure: REPAIR OF BLEEDING ARTERIOVENOUS FISTULA;  Surgeon: Nada Libman, MD;  Location: [facility] OR;  Service: Vascular;  Laterality: Left;     A IV Location/Drains/Wounds Patient Lines/Drains/Airways Status     Active Line/Drains/Airways     Name Placement date Placement time Site Days   Peripheral IV 11/03/22 20 G 1.88" Right;Anterior Forearm 11/03/22  1615  Forearm  1   Fistula / Graft Left Forearm Arteriovenous fistula --  --  Forearm  --   Fistula / Graft Left Upper arm Arteriovenous fistula 01/20/19  1417  Upper arm  1384            Intake/Output Last 24 hours  Intake/Output Summary (Last 24 hours) at 11/04/2022 1231 Last data filed at 11/03/2022 1812 Gross per 24 hour  Intake 500 ml  Output --   Net 500 ml    Labs/Imaging Results for orders placed or performed during the hospital encounter of 11/03/22 (from the past 48 hour(s))  Magnesium     Status: None   Collection Time: 11/03/22  4:12 PM  Result Value Ref Range   Magnesium 2.2 1.7 - 2.4 mg/dL    Comment: Performed at [facility], [address], [place], [place] [address]  CBC     Status: Abnormal   Collection Time: 11/03/22  4:12 PM  Result Value Ref Range   WBC 5.7 4.0 - 10.5 K/uL   RBC 3.59 (L) 3.87 - 5.11 MIL/uL   Hemoglobin 9.8 (L) 12.0 - 15.0 g/dL   HCT 48.5 (L) 46.2 - 70.3 %   MCV 89.4 80.0 - 100.0 fL   MCH 27.3 26.0 - 34.0 pg   MCHC 30.5 30.0 - 36.0 g/dL   RDW 50.0 (H) 93.8 - 18.2 %   Platelets 255 150 - 400 K/uL   nRBC 0.0 0.0 - 0.2 %  Comment: Performed at [facility], [address], [place], [place] [address]  Comprehensive metabolic panel     Status: Abnormal   Collection Time: 11/03/22  4:12 PM  Result Value Ref Range   Sodium 137 135 - 145 mmol/L   Potassium 3.6 3.5 - 5.1 mmol/L   Chloride 93 (L) 98 - 111 mmol/L   CO2 31 22 - 32 mmol/L   Glucose, Bld 82 70 - 99 mg/dL    Comment: Glucose reference range applies only to samples taken after fasting for at least 8 hours.   BUN 13 6 - 20 mg/dL   Creatinine, Ser 7.82 (H) 0.44 - 1.00 mg/dL   Calcium 9.8 8.9 - 95.6 mg/dL   Total Protein 7.7 6.5 - 8.1 g/dL   Albumin 2.9 (L) 3.5 - 5.0 g/dL   AST 14 (L) 15 - 41 U/L   ALT 11 0 - 44 U/L   Alkaline Phosphatase 55 38 - 126 U/L   Total Bilirubin 0.5 0.3 - 1.2 mg/dL   GFR, Estimated 9 (L) >60 mL/min    Comment: (NOTE) Calculated using the CKD-EPI Creatinine Equation (2021)    Anion gap 13 5 - 15    Comment: Performed at [facility], [address], [place], [place] [address]  Troponin I (High Sensitivity)     Status: Abnormal   Collection Time: 11/03/22  4:12 PM  Result Value Ref Range   Troponin I (High Sensitivity) 31 (H) <18 ng/L    Comment: (NOTE) Elevated high sensitivity  troponin I (hsTnI) values and significant  changes across serial measurements may suggest ACS but many other  chronic and acute conditions are known to elevate hsTnI results.  Refer to the "Links" section for chest pain algorithms and additional  guidance. Performed at [facility], [address] [place], [place] [address]   Phosphorus     Status: None   Collection Time: 11/03/22  4:12 PM  Result Value Ref Range   Phosphorus 3.3 2.5 - 4.6 mg/dL    Comment: Performed at [facility], [address] [place], [place] [address]  TSH     Status: None   Collection Time: 11/03/22  4:12 PM  Result Value Ref Range   TSH 2.455 0.350 - 4.500 uIU/mL    Comment: Performed by a 3rd Generation assay with a functional sensitivity of <=0.01 uIU/mL. Performed at [facility], [address] [place], [place] [address]    VAS [place] ABI WITH/WO TBI  Result Date: 11/04/2022  LOWER EXTREMITY DOPPLER STUDY Patient Name:  Desiree [name]  Date of Exam:   11/04/2022 Medical Rec #: [id number]        Accession #:    [id number] Date of Birth: [birth year]         Patient Gender: F Patient Age:   24 years Exam Location:  [facility] Procedure:      VAS [place] ABI WITH/WO TBI Referring Phys: Vernona Rieger INGOLD --------------------------------------------------------------------------------  High Risk Factors: Hypertension, past history of smoking. Other Factors: ESRD(HD), Afib, CHF.  Performing Technologist: Ernestene Mention RVT, RDMS  Examination Guidelines: A complete evaluation includes at minimum, Doppler waveform signals and systolic blood pressure reading at the level of bilateral brachial, anterior tibial, and posterior tibial arteries, when vessel segments are accessible. Bilateral testing is considered an integral part of a complete examination. Photoelectric Plethysmograph (PPG) waveforms and toe systolic pressure readings are included as required and additional duplex testing  as needed. Limited examinations  for reoccurring indications may be performed as noted.  ABI Findings: +---------+------------------+-----+---------+--------+ Right    Rt Pressure (mmHg)IndexWaveform Comment  +---------+------------------+-----+---------+--------+ Brachial 114                    triphasic         +---------+------------------+-----+---------+--------+ PTA                                      [place]       +---------+------------------+-----+---------+--------+ DP                                       [place]       +---------+------------------+-----+---------+--------+ Great Toe101               0.89 Normal            +---------+------------------+-----+---------+--------+ +---------+------------------+-----+--------+-------+ Left     Lt Pressure (mmHg)IndexWaveformComment +---------+------------------+-----+--------+-------+ Brachial                                [place]      +---------+------------------+-----+--------+-------+ PTA                                     [place]      +---------+------------------+-----+--------+-------+ DP                                      [place]      +---------+------------------+-----+--------+-------+ Great Toe120               1.05 Normal          +---------+------------------+-----+--------+-------+ +-------+-----------+-----------+------------+------------+ ABI/TBIToday's ABIToday's TBIPrevious ABIPrevious TBI +-------+-----------+-----------+------------+------------+ Right  [place]         0.89                                +-------+-----------+-----------+------------+------------+ Left   [place]         1.05                                +-------+-----------+-----------+------------+------------+  Summary: Right: Resting right ankle-brachial index indicates noncompressible right lower extremity arteries. The right toe-brachial index is normal. Left: Resting left ankle-brachial index indicates noncompressible left lower extremity arteries.  The left toe-brachial index is normal. *See table(s) above for measurements and observations.     Preliminary    DG Chest Portable 1 View  Result Date: 11/03/2022 CLINICAL DATA:  Shortness of breath. EXAM: PORTABLE CHEST 1 VIEW COMPARISON:  10/01/2022 FINDINGS: Stable enlarged cardiac silhouette. Mild diffuse peribronchial thickening and accentuation of the interstitial markings mild improvement. Mild prominence of the pulmonary vasculature improvement. Suggestion of a small amount of ill-defined patchy density in the right mid and lower lung zones. No pleural fluid. Thoracic spine degenerative changes. IMPRESSION: 1. Cardiomegaly with mild congestive heart failure, mildly improved. 2. Possible mild right mid and lower lung zone pneumonia. Electronically Signed   By: Beckie Salts M.D.   On: 11/03/2022 16:29  Pending Labs Unresulted Labs (From admission, onward)     Start     Ordered   11/05/22 0500  CBC with Differential/Platelet  Tomorrow morning,   R        11/04/22 1115   11/05/22 0500  Basic metabolic panel  Tomorrow morning,   R        11/04/22 1115   11/04/22 1212  Hepatitis B surface antigen  (New Admission Hemo Labs (Hepatitis B))  Once,   R        11/04/22 1213   11/04/22 1212  Hepatitis B surface antibody,quantitative  (New Admission Hemo Labs (Hepatitis B))  Once,   R        11/04/22 1213            Vitals/Pain Today's Vitals   11/04/22 1052 11/04/22 1100 11/04/22 1130 11/04/22 1200  BP:  115/82 104/76 113/89  Pulse:  (!) 101 86 (!) 103  Resp:  12 17 15   Temp: 98 F (36.7 C)     TempSrc: Oral     SpO2:  100% 100% 96%  PainSc:        Isolation Precautions No active isolations  Medications Medications  midodrine (PROAMATINE) tablet 10 mg (10 mg Oral Given 11/04/22 1215)  amiodarone (NEXTERONE PREMIX) 360-4.14 MG/200ML-% (1.8 mg/mL) IV infusion (0 mg/hr Intravenous Stopped 11/04/22 0131)  ALPRAZolam (XANAX) tablet 0.5 mg (0.5 mg Oral Given 11/04/22 1022)   sevelamer carbonate (RENVELA) tablet 1,600 mg (1,600 mg Oral Given 11/04/22 1019)  apixaban (ELIQUIS) tablet 5 mg (5 mg Oral Given 11/04/22 1018)  clopidogrel (PLAVIX) tablet 75 mg (75 mg Oral Given 11/04/22 0744)  acetaminophen (TYLENOL) tablet 650 mg (has no administration in time range)    Or  acetaminophen (TYLENOL) suppository 650 mg (has no administration in time range)  melatonin tablet 10 mg (has no administration in time range)  atorvastatin (LIPITOR) tablet 80 mg (80 mg Oral Given 11/04/22 1018)  pantoprazole (PROTONIX) EC tablet 40 mg (40 mg Oral Given 11/04/22 1019)  fluticasone furoate-vilanterol (BREO ELLIPTA) 100-25 MCG/ACT 1 puff (1 puff Inhalation Given 11/03/22 2356)  amiodarone (PACERONE) tablet 200 mg (200 mg Oral Given 11/04/22 1215)  doxercalciferol (HECTOROL) injection 4 mcg (has no administration in time range)  Chlorhexidine Gluconate Cloth 2 % PADS 6 each (has no administration in time range)  sodium chloride 0.9 % bolus 500 mL (0 mLs Intravenous Stopped 11/03/22 1812)  ondansetron (ZOFRAN) injection 4 mg (4 mg Intravenous Given 11/03/22 1957)  potassium chloride SA (KLOR-CON M) CR tablet 40 mEq (40 mEq Oral Given 11/03/22 2212)    Mobility walks     Focused Assessments Cardiac Assessment Handoff:  Cardiac Rhythm: Atrial fibrillation Lab Results  Component Value Date   CKTOTAL 57 03/29/2015   CKMB 0.9 03/29/2015   TROPONINI <0.30 09/09/2013   Lab Results  Component Value Date   DDIMER 1.11 (H) 01/14/2021   Does the Patient currently have chest pain? No    R Recommendations: See Admitting Provider Note  Report given to: [phone]

## 2022-11-05 ENCOUNTER — Encounter (HOSPITAL_COMMUNITY): Payer: Self-pay

## 2022-11-05 DIAGNOSIS — I4891 Unspecified atrial fibrillation: Secondary | ICD-10-CM | POA: Diagnosis not present

## 2022-11-05 LAB — CBC WITH DIFFERENTIAL/PLATELET
Abs Immature Granulocytes: 0.02 10*3/uL (ref 0.00–0.07)
Basophils Absolute: 0 10*3/uL (ref 0.0–0.1)
Basophils Relative: 1 %
Eosinophils Absolute: 0.2 10*3/uL (ref 0.0–0.5)
Eosinophils Relative: 3 %
HCT: 32.2 % — ABNORMAL LOW (ref 36.0–46.0)
Hemoglobin: 10 g/dL — ABNORMAL LOW (ref 12.0–15.0)
Immature Granulocytes: 0 %
Lymphocytes Relative: 20 %
Lymphs Abs: 1.2 10*3/uL (ref 0.7–4.0)
MCH: 28.4 pg (ref 26.0–34.0)
MCHC: 31.1 g/dL (ref 30.0–36.0)
MCV: 91.5 fL (ref 80.0–100.0)
Monocytes Absolute: 0.6 10*3/uL (ref 0.1–1.0)
Monocytes Relative: 10 %
Neutro Abs: 4 10*3/uL (ref 1.7–7.7)
Neutrophils Relative %: 66 %
Platelets: 271 10*3/uL (ref 150–400)
RBC: 3.52 MIL/uL — ABNORMAL LOW (ref 3.87–5.11)
RDW: 19.2 % — ABNORMAL HIGH (ref 11.5–15.5)
WBC: 6 10*3/uL (ref 4.0–10.5)
nRBC: 0 % (ref 0.0–0.2)

## 2022-11-05 LAB — BASIC METABOLIC PANEL
Anion gap: 14 (ref 5–15)
BUN: 26 mg/dL — ABNORMAL HIGH (ref 6–20)
CO2: 28 mmol/L (ref 22–32)
Calcium: 10.8 mg/dL — ABNORMAL HIGH (ref 8.9–10.3)
Chloride: 93 mmol/L — ABNORMAL LOW (ref 98–111)
Creatinine, Ser: 8.03 mg/dL — ABNORMAL HIGH (ref 0.44–1.00)
GFR, Estimated: 6 mL/min — ABNORMAL LOW (ref >60)
Glucose, Bld: 93 mg/dL (ref 70–99)
Potassium: 5.1 mmol/L (ref 3.5–5.1)
Sodium: 135 mmol/L (ref 135–145)

## 2022-11-05 LAB — HEPATITIS B SURFACE ANTIBODY, QUANTITATIVE: Hep B S AB Quant (Post): 76.3 m[IU]/mL

## 2022-11-05 MED ORDER — AMIODARONE HCL 200 MG PO TABS: 200.0000 mg | ORAL_TABLET | Freq: Two times a day (BID) | ORAL | 0 refills | Status: DC

## 2022-11-05 NOTE — Progress Notes (Signed)
Progress Note  Patient Name: Desiree [name] Date of Encounter: 11/05/2022  Primary Cardiologist:   Jodelle Red, MD   Subjective   No chest pain.  No SOB.   She does not notice her atrial fib.   Inpatient Medications    Scheduled Meds:  amiodarone  200 mg Oral BID   apixaban  5 mg Oral BID   atorvastatin  80 mg Oral Daily   clopidogrel  75 mg Oral Q breakfast   doxercalciferol  4 mcg Intravenous Q T,Th,Sa-HD   fluticasone furoate-vilanterol  1 puff Inhalation Daily   midodrine  10 mg Oral TID WC   pantoprazole  40 mg Oral Daily   sevelamer carbonate  1,600 mg Oral TID   Continuous Infusions:   PRN Meds: acetaminophen **OR** acetaminophen, ALPRAZolam, melatonin, ondansetron (ZOFRAN) IV   Vital Signs    Vitals:   11/04/22 1700 11/04/22 2020 11/05/22 0041 11/05/22 0603  BP:  (!) 134/90 120/81 (!) 96/56  Pulse:  87    Resp:  18 18 18   Temp:  98.4 F (36.9 C)  98.4 F (36.9 C)  TempSrc:  Oral  Oral  SpO2: 97% 100%    Weight:    78.5 kg  Height:        Intake/Output Summary (Last 24 hours) at 11/05/2022 0824 Last data filed at 11/04/2022 1512 Gross per 24 hour  Intake 133.94 ml  Output --  Net 133.94 ml   Filed Weights   11/05/22 0603  Weight: 78.5 kg    Telemetry    Atrial fib with controlled ventricular rate  - Personally Reviewed  ECG    NA - Personally Reviewed  Physical Exam   GEN: No  acute distress.   Neck: No  JVD Cardiac: Irregular RR, 3/6 apical systolic murmur, no diastolic murmurs, rubs, or gallops.  Respiratory: Clear  to auscultation bilaterally. GI: Soft, nontender, non-distended, normal bowel sounds  MS:  No edema; No deformity. Neuro:   Nonfocal  Psych: Oriented and appropriate    Labs    Chemistry Recent Labs  Lab 11/03/22 1612 11/05/22 0154  NA 137 135  K 3.6 5.1  CL 93* 93*  CO2 31 28  GLUCOSE 82 93  BUN 13 26*  CREATININE 5.38* 8.03*  CALCIUM 9.8 10.8*  PROT 7.7  --   ALBUMIN 2.9*  --   AST 14*   --   ALT 11  --   ALKPHOS 55  --   BILITOT 0.5  --   GFRNONAA 9* 6*  ANIONGAP 13 14     Hematology Recent Labs  Lab 11/03/22 1612 11/05/22 0154  WBC 5.7 6.0  RBC 3.59* 3.52*  HGB 9.8* 10.0*  HCT 32.1* 32.2*  MCV 89.4 91.5  MCH 27.3 28.4  MCHC 30.5 31.1  RDW 19.3* 19.2*  PLT 255 271    Cardiac EnzymesNo results for input(s): "TROPONINI" in the last 168 hours. No results for input(s): "TROPIPOC" in the last 168 hours.   BNPNo results for input(s): "BNP", "PROBNP" in the last 168 hours.   DDimer No results for input(s): "DDIMER" in the last 168 hours.   Radiology    VAS [place] ABI WITH/WO TBI  Result Date: 11/04/2022  LOWER EXTREMITY DOPPLER STUDY Patient Name:  Desiree [name]  Date of Exam:   11/04/2022 Medical Rec #: [id number]        Accession #:    [id number] Date of Birth: 09-May-1975  Patient Gender: F Patient Age:   64 years Exam Location:  [facility] Procedure:      VAS [place] ABI WITH/WO TBI Referring Phys: Vernona Rieger INGOLD --------------------------------------------------------------------------------  High Risk Factors: Hypertension, past history of smoking. Other Factors: ESRD(HD), Afib, CHF.  Performing Technologist: Ernestene Mention RVT, RDMS  Examination Guidelines: A complete evaluation includes at minimum, Doppler waveform signals and systolic blood pressure reading at the level of bilateral brachial, anterior tibial, and posterior tibial arteries, when vessel segments are accessible. Bilateral testing is considered an integral part of a complete examination. Photoelectric Plethysmograph (PPG) waveforms and toe systolic pressure readings are included as required and additional duplex testing as needed. Limited examinations for reoccurring indications may be performed as noted.  ABI Findings: +---------+------------------+-----+---------+--------+ Right    Rt Pressure (mmHg)IndexWaveform Comment  +---------+------------------+-----+---------+--------+ Brachial 114                     triphasic         +---------+------------------+-----+---------+--------+ PTA                                      [place]       +---------+------------------+-----+---------+--------+ DP                                       [place]       +---------+------------------+-----+---------+--------+ Great Toe101               0.89 Normal            +---------+------------------+-----+---------+--------+ +---------+------------------+-----+--------+-------+ Left     Lt Pressure (mmHg)IndexWaveformComment +---------+------------------+-----+--------+-------+ Brachial                                HD      +---------+------------------+-----+--------+-------+ PTA                                     [place]      +---------+------------------+-----+--------+-------+ DP                                      [place]      +---------+------------------+-----+--------+-------+ Great Toe120               1.05 Normal          +---------+------------------+-----+--------+-------+ +-------+-----------+-----------+------------+------------+ ABI/TBIToday's ABIToday's TBIPrevious ABIPrevious TBI +-------+-----------+-----------+------------+------------+ Right  [place]         0.89                                +-------+-----------+-----------+------------+------------+ Left   [place]         1.05                                +-------+-----------+-----------+------------+------------+  Summary: Right: Resting right ankle-brachial index indicates noncompressible right lower extremity arteries. The right toe-brachial index is normal. Left: Resting left ankle-brachial index indicates noncompressible left lower extremity arteries. The left toe-brachial index is normal. *See table(s) above for measurements and observations.  Electronically signed by Heath Lark on 11/04/2022 at 8:53:00 PM.    Final    ECHOCARDIOGRAM COMPLETE  Result Date: 11/04/2022    ECHOCARDIOGRAM REPORT    Patient Name:   Desiree [name] Date of Exam: 11/04/2022 Medical Rec #:  [id number]       Height:       64.0 in Accession #:    [id number]      Weight:       164.0 lb Date of Birth:  28-Dec-1975        BSA:          1.798 m Patient Age:    47 years        BP:           113/89 mmHg Patient Gender: F               HR:           81 bpm. Exam Location:  Inpatient Procedure: 2D Echo, Cardiac Doppler and Color Doppler Indications:    Atrial fibrillation  History:        Patient has prior history of Echocardiogram examinations, most                 recent 10/02/2022. CHF, Arrythmias:Atrial Fibrillation,                 Signs/Symptoms:Hypotension; Risk Factors:Hypertension. ESRD on                 HD, pericardial effusion.  Sonographer:    Milda Smart Referring Phys: [id number] LAURA R INGOLD IMPRESSIONS  1. Left ventricular ejection fraction, by estimation, is 45 to 50%. The left ventricle has mildly decreased function. The left ventricle demonstrates regional wall motion abnormalities (see scoring diagram/findings for description). Left ventricular diastolic function could not be evaluated.  2. Right ventricular systolic function is mildly reduced. The right ventricular size is normal. Tricuspid regurgitation signal is inadequate for assessing PA pressure.  3. A small pericardial effusion is present. The pericardial effusion is posterior to the left ventricle. There is no evidence of cardiac tamponade.  4. The mitral valve is degenerative. Mild mitral valve regurgitation. No evidence of mitral stenosis. Moderate mitral annular calcification.  5. Tricuspid valve regurgitation is mild to moderate.  6. The aortic valve is tricuspid. Aortic valve regurgitation is not visualized. Aortic valve sclerosis is present, with no evidence of aortic valve stenosis.  7. The inferior vena cava is normal in size with <50% respiratory variability, suggesting right atrial pressure of 8 mmHg. Comparison(s): No significant change from prior study.  FINDINGS  Left Ventricle: Left ventricular ejection fraction, by estimation, is 45 to 50%. The left ventricle has mildly decreased function. The left ventricle demonstrates regional wall motion abnormalities. The left ventricular internal cavity size was normal in size. There is no left ventricular hypertrophy. Left ventricular diastolic function could not be evaluated due to atrial fibrillation. Left ventricular diastolic function could not be evaluated.  LV Wall Scoring: The apex is hypokinetic. Right Ventricle: The right ventricular size is normal. No increase in right ventricular wall thickness. Right ventricular systolic function is mildly reduced. Tricuspid regurgitation signal is inadequate for assessing PA pressure. Left Atrium: Left atrial size was normal in size. Right Atrium: Right atrial size was normal in size. Pericardium: A small pericardial effusion is present. The pericardial effusion is posterior to the left ventricle. There is no evidence of cardiac tamponade. Mitral Valve: The mitral valve is degenerative in appearance. Moderate mitral  annular calcification. Mild mitral valve regurgitation. No evidence of mitral valve stenosis. Tricuspid Valve: The tricuspid valve is grossly normal. Tricuspid valve regurgitation is mild to moderate. No evidence of tricuspid stenosis. Aortic Valve: The aortic valve is tricuspid. Aortic valve regurgitation is not visualized. Aortic valve sclerosis is present, with no evidence of aortic valve stenosis. Pulmonic Valve: The pulmonic valve was grossly normal. Pulmonic valve regurgitation is not visualized. No evidence of pulmonic stenosis. Aorta: The aortic root and ascending aorta are structurally normal, with no evidence of dilitation. Venous: The inferior vena cava is normal in size with less than 50% respiratory variability, suggesting right atrial pressure of 8 mmHg. IAS/Shunts: The atrial septum is grossly normal.  LEFT VENTRICLE PLAX 2D LVIDd:         4.60 cm    Diastology LVIDs:         3.25 cm   LV e' medial:  6.42 cm/s LV PW:         0.90 cm   LV e' lateral: 8.27 cm/s LV IVS:        0.75 cm LVOT diam:     1.80 cm LVOT Area:     2.54 cm  RIGHT VENTRICLE RV Basal diam:  3.30 cm RV S prime:     10.40 cm/s TAPSE (M-mode): 1.7 cm LEFT ATRIUM             Index        RIGHT ATRIUM          Index LA diam:        4.10 cm 2.28 cm/m   RA Area:     9.41 cm LA Vol (A2C):   48.1 ml 26.75 ml/m  RA Volume:   16.50 ml 9.18 ml/m LA Vol (A4C):   45.5 ml 25.30 ml/m LA Biplane Vol: 48.1 ml 26.75 ml/m   AORTA Ao Root diam: 2.90 cm Ao Asc diam:  2.70 cm  SHUNTS Systemic Diam: 1.80 cm Lennie Odor MD Electronically signed by Lennie Odor MD Signature Date/Time: 11/04/2022/3:44:58 PM    Final    DG Chest Portable 1 View  Result Date: 11/03/2022 CLINICAL DATA:  Shortness of breath. EXAM: PORTABLE CHEST 1 VIEW COMPARISON:  10/01/2022 FINDINGS: Stable enlarged cardiac silhouette. Mild diffuse peribronchial thickening and accentuation of the interstitial markings mild improvement. Mild prominence of the pulmonary vasculature improvement. Suggestion of a small amount of ill-defined patchy density in the right mid and lower lung zones. No pleural fluid. Thoracic spine degenerative changes. IMPRESSION: 1. Cardiomegaly with mild congestive heart failure, mildly improved. 2. Possible mild right mid and lower lung zone pneumonia. Electronically Signed   By: Beckie Salts M.D.   On: 11/03/2022 16:29    Cardiac Studies   Echo:     1. Left ventricular ejection fraction, by estimation, is 45 to 50%. The  left ventricle has mildly decreased function. The left ventricle  demonstrates regional wall motion abnormalities (see scoring  diagram/findings for description). Left ventricular  diastolic function could not be evaluated.   2. Right ventricular systolic function is mildly reduced. The right  ventricular size is normal. Tricuspid regurgitation signal is inadequate  for assessing PA  pressure.   3. A small pericardial effusion is present. The pericardial effusion is  posterior to the left ventricle. There is no evidence of cardiac  tamponade.   4. The mitral valve is degenerative. Mild mitral valve regurgitation. No  evidence of mitral stenosis. Moderate mitral annular calcification.   5. Tricuspid valve  regurgitation is mild to moderate.   6. The aortic valve is tricuspid. Aortic valve regurgitation is not  visualized. Aortic valve sclerosis is present, with no evidence of aortic  valve stenosis.   7. The inferior vena cava is normal in size with <50% respiratory  variability, suggesting right atrial pressure of 8 mmHg.   Patient Profile     47 y.o. female with a hx of CAD s/p anterior STEMI with thrombus,  PTCA to 2 vessels, HD T/TH/S HTN, seizures, migraine, gout anemia, anxiety, childhood asthma, GERD, chronic, respiratory failure on 3 L, home 02, was noted to have asymptomatic a fib 09/14/22  and anticoagulation added who is being seen 11/03/2022 for the evaluation of recurrent atrial fib at the request of Dr Silverio Lay.   Assessment & Plan    ATRIAL FIB with RVR:    In and out of atrial fib but doing well in controlled fib currently.  For now can go home on 200 mg amio bid and reduce to 200 daily when we see her back in clinic.  We can then decide on long term treatment of her fib as I would not want her on amio long term.  Continue DOAC.  We talked about how she could follow her rate at home.   CAD:    Continue with secondary risk reduction.     CHRONIC SYSTOLIC HF:   Volume management per dialysis.    TR:  Moderate to severe and follow clinically  PERICARDIAL EFFUSION:     Small by echo this admission.  No change in therapy.    For questions or updates, please contact [facility] Please consult [email or web address] for contact info under Cardiology/STEMI.   Signed, Rollene Rotunda, MD  11/05/2022, 8:24 AM

## 2022-11-05 NOTE — Discharge Summary (Signed)
Physician Discharge Summary  Desiree [name] ZOX:[id number] DOB: 10-06-1975 DOA: 11/03/2022  PCP: Jearld Lesch, MD  Admit date: 11/03/2022 Discharge date: 11/05/2022  Admitted From: Home Disposition: Home  Recommendations for Outpatient Follow-up:  Follow up with PCP in 1 week Outpatient follow-up with cardiology Outpatient follow-up with hemodialysis unit today after discharge  follow up in ED if symptoms worsen or new appear   Home Health: No Equipment/Devices: None  Discharge Condition: Stable CODE STATUS: Full Diet recommendation: Heart healthy/renal hemodialysis diet  Brief/Interim Summary: 47 year old African-American female with past medical history of end-stage renal disease on hemodialysis, CAD status post anterior STEMI with thrombus with PTCA to 2 vessels, paroxysmal A-fib on anticoagulation, anemia of chronic kidney disease, chronic respiratory failure on oxygen at 3 L/minute, chronic systolic heart failure with EF of 45%, chronic pericardial effusion presented with palpitations during the chest.  On presentation, she was found to be in A-fib with RVR.  Because of hypotension, she was started on amiodarone drip.  Cardiology was consulted.  During the hospitalization, her condition has improved.  Heart rate has much controlled and she has already been switched to oral amiodarone by cardiology.  Cardiology has cleared her for discharge.  Nephrology also evaluated the patient during this hospitalization and has cleared the patient for discharge this morning.  She will directly go to her dialysis unit as an outpatient to resume her dialysis later this morning/afternoon.  Discharge patient home today.  Outpatient follow-up with PCP/cardiology/hemodialysis unit.  Discharge Diagnoses:   Paroxysmal A-fib with RVR -Cardiology following.  Currently rate controlled.  Treated with amiodarone drip.  Continue Eliquis -Heart rate has much controlled and she has already been switched to  oral amiodarone by cardiology.  Cardiology has cleared her for discharge.  Cardiology recommends amiodarone 200 mg twice a day till reevaluation by cardiology as an outpatient.   Chronic systolic heart failure -Echo showed EF of 45 to 50% -Continue diet and fluid restriction.  Volume management dialysis.  Imdur and metoprolol held because of hypotension   History of CAD -Currently no chest pain.  Cardiology following.  Continue Plavix, statin   Pericardial effusion -Chronic.  Echo during this hospitalization showed mild pericardial effusion.   End-stage renal disease on hemodialysis -Nephrology also evaluated the patient during this hospitalization and has cleared the patient for discharge this morning.  She will directly go to her dialysis unit as an outpatient to resume her dialysis later this morning/afternoon.     Chronic respiratory failure with hypoxia -On 3 L oxygen per minute at home.  Respiratory status currently stable   Anemia of chronic disease -From renal failure.  Hemoglobin stable.  Monitor intermittently as an outpatient   Chronic hypotension -Continue midodrine   GERD -Continue Protonix   Hyperlipidemia -Continue statin   Discharge Instructions  Discharge Instructions     Ambulatory referral to Cardiology   Complete by: As directed    Diet - low sodium heart healthy   Complete by: As directed    Renal hemodialysis diet   Increase activity slowly   Complete by: As directed       Allergies as of 11/05/2022       Reactions   Morphine Shortness Of Breath, Anaphylaxis   Prednisone Other (See Comments)   Other reaction(s): Other (See Comments) Muscle spasms Patient says prednisone causes her to cramp all over, muscle spasms uncontrolled   Tuna [fish Allergy] Itching, Swelling, Rash, Other (See Comments)   Face droops also   Amlodipine  Other (See Comments)   Angioedema (09/05/17 ED visit)   Iodinated Contrast Media Itching   Tape Itching   Adhesive  tape   paper tape ok        Medication List     STOP taking these medications    isosorbide mononitrate 30 MG 24 hr tablet Commonly known as: IMDUR   metoprolol tartrate 25 MG tablet Commonly known as: LOPRESSOR       TAKE these medications    acetaminophen 325 MG tablet Commonly known as: TYLENOL Take 325 mg by mouth every 6 (six) hours as needed for mild pain.   albuterol 108 (90 Base) MCG/ACT inhaler Commonly known as: VENTOLIN HFA Inhale 2 puffs into the lungs every 4 (four) hours as needed for wheezing or shortness of breath.   ALPRAZolam 1 MG tablet Commonly known as: XANAX Take 1 mg by mouth 3 (three) times daily.   amiodarone 200 MG tablet Commonly known as: PACERONE Take 1 tablet (200 mg total) by mouth 2 (two) times daily.   apixaban 5 MG Tabs tablet Commonly known as: ELIQUIS Take 1 tablet (5 mg total) by mouth 2 (two) times daily.   atorvastatin 80 MG tablet Commonly known as: LIPITOR Take 1 tablet (80 mg total) by mouth daily.   cinacalcet 30 MG tablet Commonly known as: SENSIPAR Take 2 tablets (60 mg total) by mouth daily with supper.   clopidogrel 75 MG tablet Commonly known as: PLAVIX Take 1 tablet (75 mg total) by mouth daily with breakfast.   cyclobenzaprine 10 MG tablet Commonly known as: FLEXERIL Take 10 mg by mouth 2 (two) times daily as needed for muscle spasms.   ferric citrate 1 GM 210 MG(Fe) tablet Commonly known as: AURYXIA Take 1 tablet (210 mg total) by mouth 3 (three) times daily with meals.   fluticasone furoate-vilanterol 100-25 MCG/ACT Aepb Commonly known as: Breo Ellipta Inhale 1 puff into the lungs daily.   folic acid-vitamin b complex-vitamin c-selenium-zinc 3 MG Tabs tablet Take 1 tablet by mouth daily.   lactulose 10 GM/15ML solution Commonly known as: CHRONULAC Take 30 mLs by mouth daily as needed for mild constipation, moderate constipation or severe constipation.   loperamide 2 MG capsule Commonly known  as: IMODIUM Take 2 mg by mouth as needed for diarrhea or loose stools.   midodrine 10 MG tablet Commonly known as: PROAMATINE Take 10 mg by mouth 3 (three) times daily as needed (Hypotension).   Narcan 4 MG/0.1ML Liqd nasal spray kit Generic drug: naloxone Place 1 spray into the nose as needed (accidental overdose).   ondansetron 8 MG disintegrating tablet Commonly known as: ZOFRAN-ODT Take 8 mg by mouth 2 (two) times daily.   Oxycodone HCl 10 MG Tabs Take 1 tablet (10 mg total) by mouth 3 (three) times daily as needed (severe pain). What changed: reasons to take this   pantoprazole 40 MG tablet Commonly known as: Protonix Take 1 tablet (40 mg total) by mouth daily.   sevelamer carbonate 800 MG tablet Commonly known as: RENVELA Take 1,600 mg by mouth 3 (three) times daily.        Follow-up Information     Jearld Lesch, MD. Schedule an appointment as soon as possible for a visit in 1 week(s).   Specialty: Family Medicine Contact information: [address] [place] [address] [phone]                Allergies  Allergen Reactions   Morphine Shortness Of Breath and  Anaphylaxis   Prednisone Other (See Comments)    Other reaction(s): Other (See Comments) Muscle spasms Patient says prednisone causes her to cramp all over, muscle spasms uncontrolled   Tuna [Fish Allergy] Itching, Swelling, Rash and Other (See Comments)    Face droops also   Amlodipine Other (See Comments)    Angioedema (09/05/17 ED visit)   Iodinated Contrast Media Itching   Tape Itching    Adhesive tape   paper tape ok    Consultations: Cardiology/nephrology   Procedures/Studies: VAS [place] ABI WITH/WO TBI  Result Date: 11/04/2022  LOWER EXTREMITY DOPPLER STUDY Patient Name:  HILLIARY DURO  Date of Exam:   11/04/2022 Medical Rec #: [id number]        Accession #:    [id number] Date of Birth: 04-Feb-1976         Patient Gender: F Patient Age:   53 years Exam Location:  [facility] Procedure:      VAS [place] ABI WITH/WO TBI Referring Phys: Vernona Rieger INGOLD --------------------------------------------------------------------------------  High Risk Factors: Hypertension, past history of smoking. Other Factors: ESRD(HD), Afib, CHF.  Performing Technologist: Ernestene Mention RVT, RDMS  Examination Guidelines: A complete evaluation includes at minimum, Doppler waveform signals and systolic blood pressure reading at the level of bilateral brachial, anterior tibial, and posterior tibial arteries, when vessel segments are accessible. Bilateral testing is considered an integral part of a complete examination. Photoelectric Plethysmograph (PPG) waveforms and toe systolic pressure readings are included as required and additional duplex testing as needed. Limited examinations for reoccurring indications may be performed as noted.  ABI Findings: +---------+------------------+-----+---------+--------+ Right    Rt Pressure (mmHg)IndexWaveform Comment  +---------+------------------+-----+---------+--------+ Brachial 114                    triphasic         +---------+------------------+-----+---------+--------+ PTA                                      [place]       +---------+------------------+-----+---------+--------+ DP                                       [place]       +---------+------------------+-----+---------+--------+ Great Toe101               0.89 Normal            +---------+------------------+-----+---------+--------+ +---------+------------------+-----+--------+-------+ Left     Lt Pressure (mmHg)IndexWaveformComment +---------+------------------+-----+--------+-------+ Brachial                                HD      +---------+------------------+-----+--------+-------+ PTA                                           +---------+------------------+-----+--------+-------+ DP                                             +---------+------------------+-----+--------+-------+ Great Toe120               1.05 Normal          +---------+------------------+-----+--------+-------+ +-------+-----------+-----------+------------+------------+  ABI/TBIToday's ABIToday's TBIPrevious ABIPrevious TBI +-------+-----------+-----------+------------+------------+ Right  [place]         0.89                                +-------+-----------+-----------+------------+------------+ Left   [place]         1.05                                +-------+-----------+-----------+------------+------------+  Summary: Right: Resting right ankle-brachial index indicates noncompressible right lower extremity arteries. The right toe-brachial index is normal. Left: Resting left ankle-brachial index indicates noncompressible left lower extremity arteries. The left toe-brachial index is normal. *See table(s) above for measurements and observations.  Electronically signed by Heath Lark on 11/04/2022 at 8:53:00 PM.    Final    ECHOCARDIOGRAM COMPLETE  Result Date: 11/04/2022    ECHOCARDIOGRAM REPORT   Patient Name:   [name] Date of Exam: 11/04/2022 Medical Rec #:  [id number]       Height:       64.0 in Accession #:    [id number]      Weight:       164.0 lb Date of Birth:  1975/09/28        BSA:          1.798 m Patient Age:    47 years        BP:           113/89 mmHg Patient Gender: F               HR:           81 bpm. Exam Location:  Inpatient Procedure: 2D Echo, Cardiac Doppler and Color Doppler Indications:    Atrial fibrillation  History:        Patient has prior history of Echocardiogram examinations, most                 recent 10/02/2022. CHF, Arrythmias:Atrial Fibrillation,                 Signs/Symptoms:Hypotension; Risk Factors:Hypertension. ESRD on                 HD, pericardial effusion.  Sonographer:    Milda Smart Referring Phys: [id number] LAURA R INGOLD IMPRESSIONS  1. Left ventricular ejection fraction, by estimation, is 45 to  50%. The left ventricle has mildly decreased function. The left ventricle demonstrates regional wall motion abnormalities (see scoring diagram/findings for description). Left ventricular diastolic function could not be evaluated.  2. Right ventricular systolic function is mildly reduced. The right ventricular size is normal. Tricuspid regurgitation signal is inadequate for assessing PA pressure.  3. A small pericardial effusion is present. The pericardial effusion is posterior to the left ventricle. There is no evidence of cardiac tamponade.  4. The mitral valve is degenerative. Mild mitral valve regurgitation. No evidence of mitral stenosis. Moderate mitral annular calcification.  5. Tricuspid valve regurgitation is mild to moderate.  6. The aortic valve is tricuspid. Aortic valve regurgitation is not visualized. Aortic valve sclerosis is present, with no evidence of aortic valve stenosis.  7. The inferior vena cava is normal in size with <50% respiratory variability, suggesting right atrial pressure of 8 mmHg. Comparison(s): No significant change from prior study. FINDINGS  Left Ventricle: Left ventricular ejection fraction, by estimation, is 45 to 50%. The left ventricle has  mildly decreased function. The left ventricle demonstrates regional wall motion abnormalities. The left ventricular internal cavity size was normal in size. There is no left ventricular hypertrophy. Left ventricular diastolic function could not be evaluated due to atrial fibrillation. Left ventricular diastolic function could not be evaluated.  LV Wall Scoring: The apex is hypokinetic. Right Ventricle: The right ventricular size is normal. No increase in right ventricular wall thickness. Right ventricular systolic function is mildly reduced. Tricuspid regurgitation signal is inadequate for assessing PA pressure. Left Atrium: Left atrial size was normal in size. Right Atrium: Right atrial size was normal in size. Pericardium: A small  pericardial effusion is present. The pericardial effusion is posterior to the left ventricle. There is no evidence of cardiac tamponade. Mitral Valve: The mitral valve is degenerative in appearance. Moderate mitral annular calcification. Mild mitral valve regurgitation. No evidence of mitral valve stenosis. Tricuspid Valve: The tricuspid valve is grossly normal. Tricuspid valve regurgitation is mild to moderate. No evidence of tricuspid stenosis. Aortic Valve: The aortic valve is tricuspid. Aortic valve regurgitation is not visualized. Aortic valve sclerosis is present, with no evidence of aortic valve stenosis. Pulmonic Valve: The pulmonic valve was grossly normal. Pulmonic valve regurgitation is not visualized. No evidence of pulmonic stenosis. Aorta: The aortic root and ascending aorta are structurally normal, with no evidence of dilitation. Venous: The inferior vena cava is normal in size with less than 50% respiratory variability, suggesting right atrial pressure of 8 mmHg. IAS/Shunts: The atrial septum is grossly normal.  LEFT VENTRICLE PLAX 2D LVIDd:         4.60 cm   Diastology LVIDs:         3.25 cm   LV e' medial:  6.42 cm/s LV PW:         0.90 cm   LV e' lateral: 8.27 cm/s LV IVS:        0.75 cm LVOT diam:     1.80 cm LVOT Area:     2.54 cm  RIGHT VENTRICLE RV Basal diam:  3.30 cm RV S prime:     10.40 cm/s TAPSE (M-mode): 1.7 cm LEFT ATRIUM             Index        RIGHT ATRIUM          Index LA diam:        4.10 cm 2.28 cm/m   RA Area:     9.41 cm LA Vol (A2C):   48.1 ml 26.75 ml/m  RA Volume:   16.50 ml 9.18 ml/m LA Vol (A4C):   45.5 ml 25.30 ml/m LA Biplane Vol: 48.1 ml 26.75 ml/m   AORTA Ao Root diam: 2.90 cm Ao Asc diam:  2.70 cm  SHUNTS Systemic Diam: 1.80 cm Lennie Odor MD Electronically signed by Lennie Odor MD Signature Date/Time: 11/04/2022/3:44:58 PM    Final    DG Chest Portable 1 View  Result Date: 11/03/2022 CLINICAL DATA:  Shortness of breath. EXAM: PORTABLE CHEST 1 VIEW  COMPARISON:  10/01/2022 FINDINGS: Stable enlarged cardiac silhouette. Mild diffuse peribronchial thickening and accentuation of the interstitial markings mild improvement. Mild prominence of the pulmonary vasculature improvement. Suggestion of a small amount of ill-defined patchy density in the right mid and lower lung zones. No pleural fluid. Thoracic spine degenerative changes. IMPRESSION: 1. Cardiomegaly with mild congestive heart failure, mildly improved. 2. Possible mild right mid and lower lung zone pneumonia. Electronically Signed   By: Beckie Salts M.D.   On:  11/03/2022 16:29      Subjective: Patient seen and examined at bedside.  Denies worsening shortness of breath or chest pain.  Feels okay to go home today. Discharge Exam: Vitals:   11/05/22 0603 11/05/22 0918  BP: (!) 96/56 94/81  Pulse:  76  Resp: 18 16  Temp: 98.4 F (36.9 C) 98.7 F (37.1 C)  SpO2:  100%    General: Pt is alert, awake, not in acute distress.  Currently on 1 to 2 L of oxygen via nasal cannula.  Looks chronically ill and deconditioned. Cardiovascular: rate controlled, S1/S2 + Respiratory: bilateral decreased breath sounds at bases with some scattered crackles Abdominal: Soft, NT, ND, bowel sounds + Extremities: Trace lower extremity edema; no cyanosis    The results of significant diagnostics from this hospitalization (including imaging, microbiology, ancillary and laboratory) are listed below for reference.     Microbiology: No results found for this or any previous visit (from the past 240 hour(s)).   Labs: BNP (last 3 results) Recent Labs    02/03/22 1708 03/31/22 1750 09/14/22 0243  BNP 518.9* 623.7* 1,704.8*   Basic Metabolic Panel: Recent Labs  Lab 11/03/22 1612 11/05/22 0154  NA 137 135  K 3.6 5.1  CL 93* 93*  CO2 31 28  GLUCOSE 82 93  BUN 13 26*  CREATININE 5.38* 8.03*  CALCIUM 9.8 10.8*  MG 2.2  --   PHOS 3.3  --    Liver Function Tests: Recent Labs  Lab 11/03/22 1612   AST 14*  ALT 11  ALKPHOS 55  BILITOT 0.5  PROT 7.7  ALBUMIN 2.9*   No results for input(s): "LIPASE", "AMYLASE" in the last 168 hours. No results for input(s): "AMMONIA" in the last 168 hours. CBC: Recent Labs  Lab 11/03/22 1612 11/05/22 0154  WBC 5.7 6.0  NEUTROABS  --  4.0  HGB 9.8* 10.0*  HCT 32.1* 32.2*  MCV 89.4 91.5  PLT 255 271   Cardiac Enzymes: No results for input(s): "CKTOTAL", "CKMB", "CKMBINDEX", "TROPONINI" in the last 168 hours. BNP: Invalid input(s): "POCBNP" CBG: No results for input(s): "GLUCAP" in the last 168 hours. D-Dimer No results for input(s): "DDIMER" in the last 72 hours. Hgb A1c No results for input(s): "HGBA1C" in the last 72 hours. Lipid Profile No results for input(s): "CHOL", "HDL", "LDLCALC", "TRIG", "CHOLHDL", "LDLDIRECT" in the last 72 hours. Thyroid function studies Recent Labs    11/03/22 1612  TSH 2.455   Anemia work up No results for input(s): "VITAMINB12", "FOLATE", "FERRITIN", "TIBC", "IRON", "RETICCTPCT" in the last 72 hours. Urinalysis    Component Value Date/Time   COLORURINE YELLOW 12/20/2013 0530   APPEARANCEUR CLOUDY (A) 12/20/2013 0530   LABSPEC 1.009 12/20/2013 0530   PHURINE 8.0 12/20/2013 0530   GLUCOSEU 100 (A) 12/20/2013 0530   HGBUR LARGE (A) 12/20/2013 0530   BILIRUBINUR NEGATIVE 12/20/2013 0530   KETONESUR NEGATIVE 12/20/2013 0530   PROTEINUR 100 (A) 12/20/2013 0530   UROBILINOGEN 0.2 12/20/2013 0530   NITRITE NEGATIVE 12/20/2013 0530   LEUKOCYTESUR TRACE (A) 12/20/2013 0530   Sepsis Labs Recent Labs  Lab 11/03/22 1612 11/05/22 0154  WBC 5.7 6.0   Microbiology No results found for this or any previous visit (from the past 240 hour(s)).   Time coordinating discharge: 35 minutes  SIGNED:   Glade Lloyd, MD  [facility] 11/05/2022, 10:22 AM

## 2022-11-05 NOTE — Progress Notes (Signed)
Pt to d/c this morning and receive out-pt HD at clinic. Pt will need to arrive by 11:00 or 11:30 at the latest in order to receive a full treatment. Spoke to pt via phone. Pt agreeable to plan and aware of need to get to clinic by 11:00-11:30. Providers aware of this info as well. Clinic aware pt will d/c and to arrive at clinic no later than 11:30 for treatment. Renal PA to send orders to clinic. Pt to contact husband regarding transportation at d/c to clinic/home.   Olivia Canter Renal Navigator [phone]

## 2022-11-05 NOTE — TOC Transition Note (Signed)
Transition of Care [facility]) - CM/SW Discharge Note   Patient Details  Name: Desiree [name] MRN: [id number] Date of Birth: [birth year]  [facility] CM/SW Contact:  Leone Haven, RN Phone Number: 11/05/2022, 10:38 AM   Clinical Narrative:    Patient is for dc today. She has transportation. She has no needs.    Final next level of care: Home/Self Care Barriers to Discharge: Continued Medical Work up   Patient Goals and CMS Choice   Choice offered to / list presented to : NA  Discharge Placement                         Discharge Plan and Services Additional resources added to the After Visit Summary for                                       Social Determinants of Health (SDOH) Interventions SDOH Screenings   Food Insecurity: Food Insecurity Present (11/04/2022)  Housing: Low Risk  (11/04/2022)  Transportation Needs: No Transportation Needs (11/04/2022)  Utilities: Not At Risk (11/04/2022)  Tobacco Use: Medium Risk (11/04/2022)     Readmission Risk Interventions    09/14/2022    3:42 PM  Readmission Risk Prevention Plan  Transportation Screening Complete  Medication Review (RN Care Manager) Complete  HRI or Home Care Consult Complete  SW Recovery Care/Counseling Consult Complete  Palliative Care Screening Not Applicable  Skilled Nursing Facility Not Applicable

## 2022-11-05 NOTE — Progress Notes (Signed)
[facility] Progress Note   Subjective:  Seen in room - feels better. Plan is to discharge this AM and attend HD as outpatient. No CP/dyspnea (on chronic O2).  Objective Vitals:   11/04/22 2020 11/05/22 0041 11/05/22 0603 11/05/22 0918  BP: (!) 134/90 120/81 (!) 96/56 94/81  Pulse: 87   76  Resp: 18 18 18 16   Temp: 98.4 F (36.9 C)  98.4 F (36.9 C) 98.7 F (37.1 C)  TempSrc: Oral  Oral Oral  SpO2: 100%   100%  Weight:   78.5 kg   Height:       Physical Exam General: Well appearing, NAD. Nasal O2 in place Heart: RRR Lungs: CTA Abdomen: soft Extremities: no LE edema Dialysis Access: L AVF  Additional Objective Labs: Basic Metabolic Panel: Recent Labs  Lab 11/03/22 1612 11/05/22 0154  NA 137 135  K 3.6 5.1  CL 93* 93*  CO2 31 28  GLUCOSE 82 93  BUN 13 26*  CREATININE 5.38* 8.03*  CALCIUM 9.8 10.8*  PHOS 3.3  --    Liver Function Tests: Recent Labs  Lab 11/03/22 1612  AST 14*  ALT 11  ALKPHOS 55  BILITOT 0.5  PROT 7.7  ALBUMIN 2.9*   CBC: Recent Labs  Lab 11/03/22 1612 11/05/22 0154  WBC 5.7 6.0  NEUTROABS  --  4.0  HGB 9.8* 10.0*  HCT 32.1* 32.2*  MCV 89.4 91.5  PLT 255 271   Blood Culture    Component Value Date/Time   SDES BLOOD BLOOD RIGHT HAND 10/01/2022 2146   SDES BLOOD BLOOD RIGHT HAND 10/01/2022 2146   SPECREQUEST  10/01/2022 2146    BOTTLES DRAWN AEROBIC AND ANAEROBIC Blood Culture adequate volume   SPECREQUEST  10/01/2022 2146    BOTTLES DRAWN AEROBIC AND ANAEROBIC Blood Culture adequate volume   CULT  10/01/2022 2146    NO GROWTH 5 DAYS Performed at [facility], [address], [place], [place] [address]    CULT  10/01/2022 2146    NO GROWTH 5 DAYS Performed at [facility], [address], [place], [place] [address]    REPTSTATUS 10/06/2022 FINAL 10/01/2022 2146   REPTSTATUS 10/06/2022 FINAL 10/01/2022 2146   Studies/Results: VAS [place] ABI WITH/WO TBI  Result Date: 11/04/2022  LOWER  EXTREMITY DOPPLER STUDY Patient Name:  TAISIA ROBITAILLE  Date of Exam:   11/04/2022 Medical Rec #: [id number]        Accession #:    [id number] Date of Birth: December 23, 1975         Patient Gender: F Patient Age:   47 years Exam Location:  [facility] Procedure:      VAS [place] ABI WITH/WO TBI Referring Phys: Vernona Rieger INGOLD --------------------------------------------------------------------------------  High Risk Factors: Hypertension, past history of smoking. Other Factors: ESRD(HD), Afib, CHF.  Performing Technologist: Ernestene Mention RVT, RDMS  Examination Guidelines: A complete evaluation includes at minimum, Doppler waveform signals and systolic blood pressure reading at the level of bilateral brachial, anterior tibial, and posterior tibial arteries, when vessel segments are accessible. Bilateral testing is considered an integral part of a complete examination. Photoelectric Plethysmograph (PPG) waveforms and toe systolic pressure readings are included as required and additional duplex testing as needed. Limited examinations for reoccurring indications may be performed as noted.  ABI Findings: +---------+------------------+-----+---------+--------+ Right    Rt Pressure (mmHg)IndexWaveform Comment  +---------+------------------+-----+---------+--------+ Brachial 114  triphasic         +---------+------------------+-----+---------+--------+ PTA                                      [place]       +---------+------------------+-----+---------+--------+ DP                                       [place]       +---------+------------------+-----+---------+--------+ Great Toe101               0.89 Normal            +---------+------------------+-----+---------+--------+ +---------+------------------+-----+--------+-------+ Left     Lt Pressure (mmHg)IndexWaveformComment +---------+------------------+-----+--------+-------+ Brachial                                HD       +---------+------------------+-----+--------+-------+ PTA                                     [place]      +---------+------------------+-----+--------+-------+ DP                                      [place]      +---------+------------------+-----+--------+-------+ Great Toe120               1.05 Normal          +---------+------------------+-----+--------+-------+ +-------+-----------+-----------+------------+------------+ ABI/TBIToday's ABIToday's TBIPrevious ABIPrevious TBI +-------+-----------+-----------+------------+------------+ Right  [place]         0.89                                +-------+-----------+-----------+------------+------------+ Left   [place]         1.05                                +-------+-----------+-----------+------------+------------+  Summary: Right: Resting right ankle-brachial index indicates noncompressible right lower extremity arteries. The right toe-brachial index is normal. Left: Resting left ankle-brachial index indicates noncompressible left lower extremity arteries. The left toe-brachial index is normal. *See table(s) above for measurements and observations.  Electronically signed by Heath Lark on 11/04/2022 at 8:53:00 PM.    Final    ECHOCARDIOGRAM COMPLETE  Result Date: 11/04/2022    ECHOCARDIOGRAM REPORT   Patient Name:   [name] Date of Exam: 11/04/2022 Medical Rec #:  [id number]       Height:       64.0 in Accession #:    [id number]      Weight:       164.0 lb Date of Birth:  October 11, 1975        BSA:          1.798 m Patient Age:    47 years        BP:           113/89 mmHg Patient Gender: F               HR:  81 bpm. Exam Location:  Inpatient Procedure: 2D Echo, Cardiac Doppler and Color Doppler Indications:    Atrial fibrillation  History:        Patient has prior history of Echocardiogram examinations, most                 recent 10/02/2022. CHF, Arrythmias:Atrial Fibrillation,                 Signs/Symptoms:Hypotension; Risk  Factors:Hypertension. ESRD on                 HD, pericardial effusion.  Sonographer:    Milda Smart Referring Phys: [id number] LAURA R INGOLD IMPRESSIONS  1. Left ventricular ejection fraction, by estimation, is 45 to 50%. The left ventricle has mildly decreased function. The left ventricle demonstrates regional wall motion abnormalities (see scoring diagram/findings for description). Left ventricular diastolic function could not be evaluated.  2. Right ventricular systolic function is mildly reduced. The right ventricular size is normal. Tricuspid regurgitation signal is inadequate for assessing PA pressure.  3. A small pericardial effusion is present. The pericardial effusion is posterior to the left ventricle. There is no evidence of cardiac tamponade.  4. The mitral valve is degenerative. Mild mitral valve regurgitation. No evidence of mitral stenosis. Moderate mitral annular calcification.  5. Tricuspid valve regurgitation is mild to moderate.  6. The aortic valve is tricuspid. Aortic valve regurgitation is not visualized. Aortic valve sclerosis is present, with no evidence of aortic valve stenosis.  7. The inferior vena cava is normal in size with <50% respiratory variability, suggesting right atrial pressure of 8 mmHg. Comparison(s): No significant change from prior study. FINDINGS  Left Ventricle: Left ventricular ejection fraction, by estimation, is 45 to 50%. The left ventricle has mildly decreased function. The left ventricle demonstrates regional wall motion abnormalities. The left ventricular internal cavity size was normal in size. There is no left ventricular hypertrophy. Left ventricular diastolic function could not be evaluated due to atrial fibrillation. Left ventricular diastolic function could not be evaluated.  LV Wall Scoring: The apex is hypokinetic. Right Ventricle: The right ventricular size is normal. No increase in right ventricular wall thickness. Right ventricular systolic function is  mildly reduced. Tricuspid regurgitation signal is inadequate for assessing PA pressure. Left Atrium: Left atrial size was normal in size. Right Atrium: Right atrial size was normal in size. Pericardium: A small pericardial effusion is present. The pericardial effusion is posterior to the left ventricle. There is no evidence of cardiac tamponade. Mitral Valve: The mitral valve is degenerative in appearance. Moderate mitral annular calcification. Mild mitral valve regurgitation. No evidence of mitral valve stenosis. Tricuspid Valve: The tricuspid valve is grossly normal. Tricuspid valve regurgitation is mild to moderate. No evidence of tricuspid stenosis. Aortic Valve: The aortic valve is tricuspid. Aortic valve regurgitation is not visualized. Aortic valve sclerosis is present, with no evidence of aortic valve stenosis. Pulmonic Valve: The pulmonic valve was grossly normal. Pulmonic valve regurgitation is not visualized. No evidence of pulmonic stenosis. Aorta: The aortic root and ascending aorta are structurally normal, with no evidence of dilitation. Venous: The inferior vena cava is normal in size with less than 50% respiratory variability, suggesting right atrial pressure of 8 mmHg. IAS/Shunts: The atrial septum is grossly normal.  LEFT VENTRICLE PLAX 2D LVIDd:         4.60 cm   Diastology LVIDs:         3.25 cm   LV e' medial:  6.42 cm/s LV PW:  0.90 cm   LV e' lateral: 8.27 cm/s LV IVS:        0.75 cm LVOT diam:     1.80 cm LVOT Area:     2.54 cm  RIGHT VENTRICLE RV Basal diam:  3.30 cm RV S prime:     10.40 cm/s TAPSE (M-mode): 1.7 cm LEFT ATRIUM             Index        RIGHT ATRIUM          Index LA diam:        4.10 cm 2.28 cm/m   RA Area:     9.41 cm LA Vol (A2C):   48.1 ml 26.75 ml/m  RA Volume:   16.50 ml 9.18 ml/m LA Vol (A4C):   45.5 ml 25.30 ml/m LA Biplane Vol: 48.1 ml 26.75 ml/m   AORTA Ao Root diam: 2.90 cm Ao Asc diam:  2.70 cm  SHUNTS Systemic Diam: 1.80 cm Lennie Odor MD  Electronically signed by Lennie Odor MD Signature Date/Time: 11/04/2022/3:44:58 PM    Final    DG Chest Portable 1 View  Result Date: 11/03/2022 CLINICAL DATA:  Shortness of breath. EXAM: PORTABLE CHEST 1 VIEW COMPARISON:  10/01/2022 FINDINGS: Stable enlarged cardiac silhouette. Mild diffuse peribronchial thickening and accentuation of the interstitial markings mild improvement. Mild prominence of the pulmonary vasculature improvement. Suggestion of a small amount of ill-defined patchy density in the right mid and lower lung zones. No pleural fluid. Thoracic spine degenerative changes. IMPRESSION: 1. Cardiomegaly with mild congestive heart failure, mildly improved. 2. Possible mild right mid and lower lung zone pneumonia. Electronically Signed   By: Beckie Salts M.D.   On: 11/03/2022 16:29   Medications:   amiodarone  200 mg Oral BID   apixaban  5 mg Oral BID   atorvastatin  80 mg Oral Daily   clopidogrel  75 mg Oral Q breakfast   doxercalciferol  4 mcg Intravenous Q T,Th,Sa-HD   fluticasone furoate-vilanterol  1 puff Inhalation Daily   midodrine  10 mg Oral TID WC   pantoprazole  40 mg Oral Daily   sevelamer carbonate  1,600 mg Oral TID    Dialysis Orders: SW TTS  3h  400/600   73.3kg  2/2 bath  LUA AVF   Heparin 6600 - last OP HD 8/20, post wt 73.3kg  - hectorol 4 mcg IV three times per week - mircera 225 mcg IV q 2 wks, last 8/13, due 8/27     Assessment/ Plan: Atrial fib w/ RVR - improved on IV amio -> converted to PO now and tolerating. BB/ cardizem not options due to hypotension on dialysis. HR looks much better.  ESRD - on HD TTS. Had OP HD yesterday. For HD today, as outpatient. HTN/ volume - BP's low normal, euvolemic on exam. Was at dry wt post hd yesterday.  Anemia esrd - Hb 9.8, next esa due on 8/27. Follow.  MBD ckd - CCa and phos are in range. Cont IV vdra and binder.  Chron resp failure - on home O2 at [employer], PA-C 11/05/2022, 10:30 AM  [employer]

## 2022-11-05 NOTE — Discharge Planning (Signed)
[facility] Patient Discharge Orders - [facility] CLINIC: [facility]  Patient's name: Desiree [name] Admit/DC Dates: 11/03/2022 - 11/05/2022  DISCHARGE DIAGNOSES: A-fib with RVR ESRD  HD ORDER CHANGES: Heparin change: no EDW Change: no *not dialyzed here* Bath Change: no  ANEMIA MANAGEMENT: Aranesp: Given: no  ESA dose for discharge: mircera 225 mcg IV q 2 weeks, to start on 8/27 (SAME) IV Iron dose at discharge: per protocol Transfusion: Given: no  BONE/MINERAL MEDICATIONS: Hectorol/Calcitriol change: no Sensipar/Parsabiv change: no  ACCESS INTERVENTION/CHANGE: no Details:   RECENT LABS: Recent Labs  Lab 11/03/22 1612 11/05/22 0154  HGB 9.8* 10.0*  NA 137 135  K 3.6 5.1  CALCIUM 9.8 10.8*  PHOS 3.3  --   ALBUMIN 2.9*  --     IV ANTIBIOTICS: no Details:  OTHER ANTICOAGULATION: On Coumadin?: no She is on Eliquis  OTHER/APPTS/LAB ORDERS: - Coming for dialysis TODAY - Now on PO amiodarone  D/C Meds to be reconciled by nurse after every discharge.  Completed By: Ozzie Hoyle, PA-C [facility] Pager [phone]   Reviewed by: MD:______ RN_______

## 2022-11-06 ENCOUNTER — Encounter (HOSPITAL_COMMUNITY): Payer: Self-pay

## 2022-11-07 ENCOUNTER — Encounter (HOSPITAL_COMMUNITY): Payer: Self-pay

## 2022-11-07 ENCOUNTER — Ambulatory Visit (HOSPITAL_COMMUNITY): Payer: 59 | Admitting: Psychiatry

## 2022-11-09 NOTE — Progress Notes (Signed)
No show

## 2022-11-11 ENCOUNTER — Ambulatory Visit (HOSPITAL_COMMUNITY): Payer: 59

## 2022-11-17 ENCOUNTER — Ambulatory Visit (INDEPENDENT_AMBULATORY_CARE_PROVIDER_SITE_OTHER): Payer: 59 | Admitting: Physician Assistant

## 2022-11-17 ENCOUNTER — Encounter: Payer: Self-pay | Admitting: Physician Assistant

## 2022-11-17 DIAGNOSIS — M545 Low back pain, unspecified: Secondary | ICD-10-CM | POA: Insufficient documentation

## 2022-11-17 DIAGNOSIS — M5440 Lumbago with sciatica, unspecified side: Secondary | ICD-10-CM

## 2022-11-17 MED ORDER — ALPRAZOLAM 1 MG PO TABS: 1.0000 mg | ORAL_TABLET | Freq: Three times a day (TID) | ORAL | 0 refills | Status: AC | PRN

## 2022-11-17 NOTE — Addendum Note (Signed)
Addended by: Polly Cobia on: 11/17/2022 03:30 PM   Modules accepted: Orders

## 2022-11-17 NOTE — Progress Notes (Signed)
Office Visit Note   Patient: Desiree [name]           Date of Birth: Dec 30, 1975           MRN: [id number] Visit Date: 11/17/2022              Requested by: Anthony Sar, MD [address] [place],  [place] [address] PCP: Jearld Lesch, MD   Assessment & Plan: Visit Diagnoses:  1. Bilateral low back pain with sciatica, sciatica laterality unspecified, unspecified chronicity     Plan: [name] is a pleasant 47 year old woman who is accompanied by a family member.  She has a complicated medical history including end-stage renal disease next secondary to nephrotic syndrome.  She also has multiple cardiac issues including atrial fibrillation is chronic anticoagulants.  She says she has had low back pain with pain shooting down her legs for almost a year.  She denies any particular injury.  She has had multiple x-rays that show some degenerative changes but nothing else specific.  Her pain is severe and progressing.  She is given oxycodone but that does not even to seem to help her that much.  She denies any loss of bowel control.  She denies any fever or chills.  It is very difficult for her to even tolerate her dialysis sessions.  She also complains of some knee issues but truly I think her back issues are the most can significant.  I do recommend an MRI of her lumbar spine.  She does have some claustrophobia so we will order some Valium.  Given her health history I am not sure she be a surgical candidate but we will still have her follow-up with Dr. Christell Constant to review the MRI.  She has tried both physical therapy and has had multiple back x-rays in the last year and has had no improvement in her symptoms.  If MRI does not show anything significant could consider knee x-rays.  She has had admissions recently for cardiac issues.  Also has had recent venous Doppler ultrasounds which did not explain her symptoms  Follow-Up Instructions: With Dr. Christell Constant after MRI  Orders:  No orders of the defined types  were placed in this encounter.  No orders of the defined types were placed in this encounter.     Procedures: No procedures performed   Clinical Data: No additional findings.   Subjective: Chief Complaint  Patient presents with   Right Knee - Pain   Left Knee - Pain    HPI patient is a pleasant 47 year old woman who comes in today with severe pain that is been going on almost a year and progressing in her posterior buttocks that shoots down her leg.  She does describe some tingling.  She takes oxycodone chronically but does not seem to help.  Denies any particular falls but feels like her legs are getting weaker.  She has had to start ambulating with the use of a rollator walker in the last month or so.  Her history is complicated by significant cardiac history as well as end-stage renal disease.  She is on dialysis.  Also has pain in her knees but is the pain that shoots down her legs as the most significant  Review of Systems  All other systems reviewed and are negative.    Objective: Vital Signs: There were no vitals taken for this visit.  Physical Exam Constitutional:      Appearance: Normal appearance.  Pulmonary:  Effort: Pulmonary effort is normal.  Skin:    General: Skin is warm and dry.  Neurological:     General: No focal deficit present.     Mental Status: She is alert and oriented to person, place, and time.  Psychiatric:        Mood and Affect: Mood normal.     Ortho Exam She is sitting with oxygen and a rollator walker.  She cannot tolerate sitting further down because pain shoots down her back.  She can weakly flex and extend her ankles and flex and extend her legs.  Attempted straight leg raise reproduces pain going down the back of her legs to her feet.  She does have some mild swelling in her knees and is sensitive even to light touch but no effusion or erythema compartments are soft and nontender Specialty Comments:  No specialty comments  available.  Imaging: No results found.   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   Acute on chronic systolic CHF (congestive heart failure) (HCC) 09/17/2022   Pulmonary hypertension, unspecified (HCC) 09/17/2022   Pericardial effusion 09/17/2022   Nonrheumatic mitral valve regurgitation 09/17/2022   Tricuspid regurgitation 09/17/2022   Cardiomyopathy, ischemic 09/14/2022   Anteroapical myocardial infarction (HCC) 09/13/2022   Atrial fibrillation (HCC) 09/13/2022   Asthma, persistent not controlled 04/30/2022   Chronic respiratory failure with hypoxia (HCC) - on hme O2 @ 3 L/min 02/03/2022   Anxiety and depression 02/03/2022   Gout 02/03/2022   Hypertension 02/03/2022   Volume overload 12/21/2021   Class 1 obesity 12/20/2021   CHF (congestive heart failure) (HCC) 12/19/2021   Prolonged QT interval 12/05/2021   GERD (gastroesophageal reflux disease) 01/11/2021   ESRD (end stage renal disease) on dialysis (HCC) 06/02/2018   Chronic pain 01/21/2016   Muscle cramps 03/29/2015   Anemia of chronic kidney failure 09/15/2013   Cocaine abuse (HCC) 08/26/2013   Depression 04/21/2012   ESRD (end stage renal disease) (HCC) 08/03/2011   Past Medical History:  Diagnosis Date   Anemia    Anxiety    panic attacks   Arthritis    bilateral knees   Childhood asthma    Complication of anesthesia    "sometimes it does not work; didn't during LEEP OR" (01/21/2016)   Depression    no med   ESRD (end stage renal disease) on dialysis (HCC)    "TTS; [facility]; [facility]" (08/03/2016)   GERD (gastroesophageal reflux disease)    nexium prn   Gout    History of blood transfusion    "related to kidneys; I've had 4" (01/21/2016)   Hypertension    Hypertensive urgency 12/05/2021   Migraine     last one 01/18/19   Preterm labor ~ 2014   Retina hole, left    Seizures (HCC)    "last one was in 2000; related to preeclampsia" (01/21/2016)    Family History  Problem Relation Age of Onset   Diabetes Mother    Hyperlipidemia Mother    Hypertension Mother    Heart disease Mother    Hypertension Other    Diabetes type II Other     Past Surgical History:  Procedure Laterality Date   A/V FISTULAGRAM Left 12/19/2021   Procedure: A/V Fistulagram;  Surgeon: Chuck Hint, MD;  Location: [facility]  INVASIVE CV LAB;  Service: Cardiovascular;  Laterality: Left;   AV FISTULA PLACEMENT Left 2010   AV FISTULA PLACEMENT Left 01/20/2019   Procedure: ARTERIOVENOUS (AV) FISTULA CREATION LEFT UPPER ARM;  Surgeon: Chuck Hint, MD;  Location: [facility] OR;  Service: Vascular;  Laterality: Left;   AV FISTULA PLACEMENT Left 12/19/2021   Procedure: ARTERIOVENOUS (AV) FISTULA CREATION POSSIBLE TUNNELED DIALYSIS;  Surgeon: Maeola Harman, MD;  Location: [facility] OR;  Service: Vascular;  Laterality: Left;   CERVICAL BIOPSY  W/ LOOP ELECTRODE EXCISION  2001   CORONARY/GRAFT ACUTE MI REVASCULARIZATION N/A 09/12/2022   Procedure: Coronary/Graft Acute MI Revascularization;  Surgeon: Corky Crafts, MD;  Location: [facility] INVASIVE CV LAB;  Service: Cardiovascular;  Laterality: N/A;   DILATION AND EVACUATION  08/02/2011   Procedure: DILATATION AND EVACUATION;  Surgeon: Oliver Pila, MD;  Location: [facility] ORS;  Service: Gynecology;;   DILATION AND EVACUATION N/A 08/31/2013   Procedure: DILATATION AND EVACUATION;  Surgeon: Adam Phenix, MD;  Location: [facility] ORS;  Service: Gynecology;  Laterality: N/A;   FISTULA SUPERFICIALIZATION Left 01/20/2019   Procedure: Fistula Superficialization;  Surgeon: Chuck Hint, MD;  Location: [facility] OR;  Service: Vascular;  Laterality: Left;   HYDRADENITIS EXCISION Right    INSERTION OF DIALYSIS CATHETER Right 12/19/2021   Procedure: INSERTION OF A TUNNELED DIALYSIS  CATHETER;  Surgeon: Maeola Harman, MD;  Location: [facility] OR;  Service: Vascular;  Laterality: Right;   LEFT HEART CATH AND CORONARY ANGIOGRAPHY N/A 09/12/2022   Procedure: LEFT HEART CATH AND CORONARY ANGIOGRAPHY;  Surgeon: Corky Crafts, MD;  Location: [facility] INVASIVE CV LAB;  Service: Cardiovascular;  Laterality: N/A;   RENAL BIOPSY     REVISION OF ARTERIOVENOUS GORETEX GRAFT Left 02/11/2013   Procedure: REVISION OF ARTERIOVENOUS GORTEX FISTULA;  Surgeon: Larina Earthly, MD;  Location: [facility] OR;  Service: Vascular;  Laterality: Left;   THROMBECTOMY W/ EMBOLECTOMY Left 05/25/2018   Procedure: REPAIR OF BLEEDING ARTERIOVENOUS FISTULA;  Surgeon: Nada Libman, MD;  Location: [facility] OR;  Service: Vascular;  Laterality: Left;   Social History   Occupational History   Not on file  Tobacco Use   Smoking status: Former    Types: Cigarettes    Passive exposure: Yes   Smokeless tobacco: Never   Tobacco comments:    Husband and daughter Smokes on patio  Vaping Use   Vaping status: Never Used  Substance and Sexual Activity   Alcohol use: No   Drug use: Yes    Types: Marijuana    Comment: not recent   Sexual activity: Yes    Birth control/protection: None

## 2022-11-18 ENCOUNTER — Ambulatory Visit (HOSPITAL_COMMUNITY): Payer: 59

## 2022-11-18 ENCOUNTER — Ambulatory Visit (HOSPITAL_BASED_OUTPATIENT_CLINIC_OR_DEPARTMENT_OTHER): Payer: 59 | Admitting: Family

## 2022-11-19 NOTE — Progress Notes (Deleted)
Cardiology Clinic Note   Date: 11/19/2022 ID: Desiree KELLOG, DOB [birth year], MRN [id number]  Primary Cardiologist:  Jodelle Red, MD  Patient Profile    Desiree [name] is a 47 y.o. 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 LHC 09/13/2022 (acute anterolateral STEMI): Distal OM 3 100%.  Distal LAD 100%.  Mid CX 50%, thrombotic.  Balloon angioplasty to OM 3 and distal LAD.  No atherosclerotic CAD.  Occlusions appear to be from embolization of thrombus down the left main reaching the apical LAD and distal OM 3.  Angioplasty done in both areas but neither area receiving TIMI-3 flow.  Recommend long-term oral anticoagulation.  Could also use clopidogrel for antiplatelet therapy depending on bleeding concerns. Chronic systolic heart failure. Echo 11/04/2022: EF 45 to 50%.  Left ventricular diastolic function cannot be evaluated.  Mildly reduced RV function.  Small pericardial effusion posterior to the left ventricle with no evidence of cardiac tamponade.  Mild MR.  Moderate MAC.  Mild to moderate TR.  Aortic valve sclerosis without stenosis. Pulmonary hypertension. Pericardial effusion. 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  Area of mobile calcium on atrial side of posterior analysis.  Cannot rule out vegetation versus exuberant MAC.  Can consider TEE if clinically indicated.  Mild MR.  No mitral stenosis. PAF. Hypertension. Hyperlipidemia. ESRD. On HD TuThSa. Asthma. GERD.     History of Present Illness    Desiree [name] was first evaluated by Dr. Cristal Deer on 12/04/2021 for shortness of breath at the request of Dr. Renaye Rakers.  Patient was found to have low oxygen saturation at ER evaluation and admission was recommended but patient declined secondary to feeling as though she had not been listened to previously and currently having personal family issue.  She was willing to do outpatient  evaluation starting with CTA for PE, echo and home O2.  She reported itching/hives with contrast previously.  Given urgency and only itching and not anaphylaxis decision was made for patient to have CT in ER so if she reacts she can be monitored.  Patient presented to the ED for premedication prior to CT.  CTA was negative for PE with findings consistent with moderate severity pulmonary edema and small to moderate bilateral pleural effusions.  She was transferred to [facility] and admitted.  She was dialyzed with plan for 1 more session however patient insisted on being discharged and undergoing dialysis as an outpatient.  SpO2 100% on room air.  She underwent echo on 01/05/2022 which showed EF 60 to 65%, moderate LVH, indeterminate diastolic parameters, normal RV function, severe LAE, severe MAC (details above).  Patient underwent hospital admission on 09/12/2022 for severe crushing chest pain with associated nausea and vomiting.  She was found to have anterior STEMI by EMS.  She underwent LHC with findings suspicious for thrombolic embolic event coronaries likely from previously undetected A-fib.  Angioplasty performed to distal OM 3 and distal LAD without restoration of significant flow.  Echo showed EF 35 to 40%, continued findings of calcified mobile mass with no clinical signs of endocarditis.  She was discharged on Eliquis and Plavix.  GDMT limited secondary to BP.  Last seen in the office by Gillian Shields, NP on 09/28/2022 for hospital follow-up.  She noted chest pain but was uncertain if this was associated with panic attacks.  She reported hypotension particularly at hemodialysis.  She had not been taking midodrine.  Isosorbide  was decreased and patient was instructed to take on non-dialysis days.  Patient presented to the [facility] ED on 10/01/2022 with chest pain.  She with no acute changes.  Troponin 126>> 123>> 116>> 130.  Cardiology consulted and decision made to transfer patient to [facility]   Cyst treatment secondary to oversleeping.  She reported mild swelling of ankles which was unusual for headache which typically occurred for her when she was volume up.  Nephrology consulted.  Repeat invasive ischemic evaluation not indicated during her hospital admission.  She was discharged on 10/03/2022.  Patient presented to the ED on 11/03/2022 from doctors office for A-fib.  Her heart rate was in the 170s and her BP was low.  ED EKG showed A-fib, 115 bpm.  Cardiology was consulted and it was recommended to start amiodarone drip.  He converted to NSR and was transitioned to p.o. amiodarone.  She was discharged on amiodarone 200 mg twice daily with plan to reduce to 200 mg daily upon follow-up.   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  Balloon angioplasty to distal OM 3 and distal LAD without restoration of significant flow. Patient *** Continue Eliquis and Plavix. PAF.  Most recent hospital admission 11/03/2022 for A-fib with RVR.  Patient started on IV amiodarone and converted to normal sinus rhythm.  She was in and out of A-fib with rate control.  She was discharged on amiodarone 200 mg twice daily.  Will reduce dose to 200 mg daily.  EKG today***  Refer to A-fib clinic*** Continue Eliquis, amiodarone. Chronic systolic heart failure.  Echo August 2024 showed EF 45 to 50%.  Patient*** Euvolemic and well compensated on exam.  Volume managed with HD.  GDMT limited secondary to hypotension. Hypotension. BP today *** Patient*** Continue midodrine as needed.   ROS: All other systems reviewed and are otherwise negative except as noted in History of Present Illness.  Studies Reviewed       ***  Risk Assessment/Calculations    {Does this patient have ATRIAL FIBRILLATION?[phone]} No BP recorded.  {Refresh Note OR Click here to enter BP  :1}***        Physical Exam    VS:  There were no vitals taken for this visit. , BMI There is no height or weight on file to calculate  BMI.  GEN: Well nourished, well developed, in no acute distress. Neck: No JVD or carotid bruits. Cardiac: *** RRR. No murmurs. No rubs or gallops.   Respiratory:  Respirations regular and unlabored. Clear to auscultation without rales, wheezing or rhonchi. GI: Soft, nontender, nondistended. Extremities: Radials/DP/PT 2+ and equal bilaterally. No clubbing or cyanosis. No edema ***  Skin: Warm and dry, no rash. Neuro: Strength intact.  Assessment & Plan   ***  Disposition: ***  {The patient has an active order for outpatient cardiac rehabilitation.   Please indicate if the patient is ready to start. Do NOT delete this.  It will auto delete.  Refresh note, then sign.              Click here to document readiness and see contraindications.  :1}  Cardiac Rehabilitation Eligibility Assessment      {Are you ordering a CV Procedure (e.g. stress test, cath, DCCV, TEE, etc)?   Press F2        :[id number]}   Signed, Etta Grandchild. Sophy Mesler, DNP, NP-C

## 2022-11-20 ENCOUNTER — Ambulatory Visit: Payer: 59 | Admitting: Physician Assistant

## 2022-11-20 ENCOUNTER — Ambulatory Visit (HOSPITAL_COMMUNITY): Payer: 59

## 2022-11-20 ENCOUNTER — Ambulatory Visit: Payer: 59 | Attending: Family | Admitting: Student

## 2022-11-23 ENCOUNTER — Ambulatory Visit (HOSPITAL_COMMUNITY): Payer: 59

## 2022-11-23 ENCOUNTER — Telehealth (HOSPITAL_COMMUNITY): Payer: Self-pay

## 2022-11-23 NOTE — Telephone Encounter (Signed)
Attempted to call patient in regards to Cardiac Rehab - LM on VM 

## 2022-11-25 ENCOUNTER — Ambulatory Visit (HOSPITAL_COMMUNITY): Payer: 59

## 2022-11-26 ENCOUNTER — Encounter (HOSPITAL_COMMUNITY): Payer: Self-pay

## 2022-11-26 ENCOUNTER — Telehealth (HOSPITAL_COMMUNITY): Payer: Self-pay

## 2022-11-26 NOTE — Telephone Encounter (Signed)
Attempted to call patient in regards to Cardiac Rehab - LM on VM   Sent letter 

## 2022-11-27 ENCOUNTER — Ambulatory Visit (HOSPITAL_COMMUNITY): Payer: 59

## 2022-11-30 ENCOUNTER — Ambulatory Visit (HOSPITAL_COMMUNITY): Payer: 59

## 2022-12-02 ENCOUNTER — Ambulatory Visit (HOSPITAL_COMMUNITY): Payer: 59

## 2022-12-02 ENCOUNTER — Telehealth: Payer: Self-pay | Admitting: *Deleted

## 2022-12-02 NOTE — Telephone Encounter (Signed)
-----   Message from Rollene Rotunda sent at 12/02/2022  2:36 PM EDT ----- Yes.  Next few weeks to make sure she is on the correct amio dose ----- Message ----- From: Freddi Starr, RN Sent: 12/02/2022   2:08 PM EDT To: Rollene Rotunda, MD  Her follow up is in December? Does that need to be sooner? ----- Message ----- From: Rollene Rotunda, MD Sent: 12/01/2022   2:35 PM EDT To: Freddi Starr, RN; Jodelle Red, MD  Bridgette,  This is a patient of yours and I wanted to make sure she has follow up so we can make sure she reduces her amio and we make a long term plan for her fib.  I see something scheduled and lots of no shows and want to make sure she does not get lost in the shuffle.  Thanks. Leta Jungling

## 2022-12-02 NOTE — Telephone Encounter (Signed)
Spoke with pt, Follow up scheduled

## 2022-12-04 ENCOUNTER — Ambulatory Visit (HOSPITAL_COMMUNITY): Payer: 59

## 2022-12-04 ENCOUNTER — Telehealth (HOSPITAL_COMMUNITY): Payer: Self-pay

## 2022-12-04 NOTE — Telephone Encounter (Signed)
Pt sent message stating that she is interested in the cardiac rehab program. She has to complete the f/u on 10/7. Will place her ppw in the 10/7 follow up folder

## 2022-12-07 ENCOUNTER — Ambulatory Visit (HOSPITAL_COMMUNITY): Payer: 59

## 2022-12-09 ENCOUNTER — Ambulatory Visit (HOSPITAL_BASED_OUTPATIENT_CLINIC_OR_DEPARTMENT_OTHER): Payer: 59 | Admitting: Cardiology

## 2022-12-09 ENCOUNTER — Ambulatory Visit (HOSPITAL_COMMUNITY): Payer: 59

## 2022-12-09 ENCOUNTER — Telehealth (HOSPITAL_BASED_OUTPATIENT_CLINIC_OR_DEPARTMENT_OTHER): Payer: Self-pay | Admitting: Cardiology

## 2022-12-09 DIAGNOSIS — I480 Paroxysmal atrial fibrillation: Secondary | ICD-10-CM

## 2022-12-09 DIAGNOSIS — I252 Old myocardial infarction: Secondary | ICD-10-CM

## 2022-12-09 MED ORDER — AMIODARONE HCL 200 MG PO TABS: 200.0000 mg | ORAL_TABLET | Freq: Two times a day (BID) | ORAL | 0 refills | Status: DC

## 2022-12-09 NOTE — Telephone Encounter (Signed)
*  STAT* If patient is at the pharmacy, call can be transferred to refill team.   1. Which medications need to be refilled? (please list name of each medication and dose if known) amiodarone (PACERONE) 200 MG tablet    2. Would you like to learn more about the convenience, safety, & potential cost savings by using the [facility]?      3. Are you open to using the [facility] (Type [facility]. ).   4. Which pharmacy/location (including street and city if local pharmacy) is medication to be sent to?  [id number] - [place], [place] - [address] AT [facility] OF [address]    5. Do they need a 30 day or 90 day supply? 90 day  Patient is out of medication.

## 2022-12-09 NOTE — Telephone Encounter (Signed)
See encounter from 9/18, patient appointment moved up to ensure patient on correct dose of amio, scheduled 10/7. Do you want to authorize short term script to get her to appointment?

## 2022-12-09 NOTE — Telephone Encounter (Signed)
Short term rx sent to pharmacy on file.

## 2022-12-11 ENCOUNTER — Ambulatory Visit (HOSPITAL_COMMUNITY): Payer: 59

## 2022-12-14 ENCOUNTER — Ambulatory Visit (HOSPITAL_COMMUNITY): Payer: 59

## 2022-12-16 ENCOUNTER — Ambulatory Visit (HOSPITAL_COMMUNITY): Payer: 59

## 2022-12-18 ENCOUNTER — Ambulatory Visit (HOSPITAL_COMMUNITY): Payer: 59

## 2022-12-21 ENCOUNTER — Ambulatory Visit (HOSPITAL_BASED_OUTPATIENT_CLINIC_OR_DEPARTMENT_OTHER): Payer: 59 | Admitting: Family

## 2022-12-21 ENCOUNTER — Encounter (HOSPITAL_BASED_OUTPATIENT_CLINIC_OR_DEPARTMENT_OTHER): Payer: Self-pay | Admitting: Family

## 2022-12-21 ENCOUNTER — Other Ambulatory Visit (HOSPITAL_BASED_OUTPATIENT_CLINIC_OR_DEPARTMENT_OTHER): Payer: 59

## 2022-12-21 ENCOUNTER — Ambulatory Visit (HOSPITAL_COMMUNITY): Payer: 59

## 2022-12-21 VITALS — BP 104/72 | HR 67 | Ht 64.0 in | Wt 161.0 lb

## 2022-12-21 DIAGNOSIS — I25118 Atherosclerotic heart disease of native coronary artery with other forms of angina pectoris: Secondary | ICD-10-CM | POA: Diagnosis not present

## 2022-12-21 DIAGNOSIS — I480 Paroxysmal atrial fibrillation: Secondary | ICD-10-CM

## 2022-12-21 DIAGNOSIS — Z79899 Other long term (current) drug therapy: Secondary | ICD-10-CM

## 2022-12-21 DIAGNOSIS — I252 Old myocardial infarction: Secondary | ICD-10-CM

## 2022-12-21 DIAGNOSIS — D6859 Other primary thrombophilia: Secondary | ICD-10-CM | POA: Diagnosis not present

## 2022-12-21 DIAGNOSIS — E785 Hyperlipidemia, unspecified: Secondary | ICD-10-CM

## 2022-12-21 MED ORDER — AMIODARONE HCL 200 MG PO TABS
200.0000 mg | ORAL_TABLET | Freq: Every day | ORAL | 5 refills | Status: AC
Start: 2022-12-21 — End: 2023-12-25

## 2022-12-21 NOTE — Patient Instructions (Addendum)
Medication Instructions:   REDUCE Amiodarone to 200mg  ONCE per day  Midodrine is used to raise your blood pressure. Recommend asking your nephrology team if you should take your Midodrine prior to your dialysis sessions. Recommend taking for systolic blood pressure (top number) less than 100.  Do not take more frequently than breakfast, lunch, dinner.  We will call you in 2 weeks to check in on your blood pressure. Please keep a log.  NO MORE Isosorbide (Imdur) or Metoprolol (Lopressor) as these were making your blood pressure too low.  *If you need a refill on your cardiac medications before your next appointment, please call your pharmacy*  Testing/Procedures: Your physician has recommended that you wear a Zio monitor.   This monitor is a medical device that records the heart's electrical activity. Doctors most often use these monitors to diagnose arrhythmias. Arrhythmias are problems with the speed or rhythm of the heartbeat. The monitor is a small device applied to your chest. You can wear one while you do your normal daily activities. While wearing this monitor if you have any symptoms to push the button and record what you felt. Once you have worn this monitor for the period of time provider prescribed (Usually 14 days), you will return the monitor device in the postage paid box. Once it is returned they will download the data collected and provide [place] with a report which the provider will then review and we will call you with those results. Important tips:  Avoid showering during the first 24 hours of wearing the monitor. Avoid excessive sweating to help maximize wear time. Do not submerge the device, no hot tubs, and no swimming pools. Keep any lotions or oils away from the patch. After 24 hours you may shower with the patch on. Take brief showers with your back facing the shower head.  Do not remove patch once it has been placed because that will interrupt data and decrease adhesive  wear time. Push the button when you have any symptoms and write down what you were feeling. Once you have completed wearing your monitor, remove and place into box which has postage paid and place in your outgoing mailbox.  If for some reason you have misplaced your box then call our office and we can provide another box and/or mail it off for you.  Follow-Up: At [facility], you and your health needs are our priority.  As part of our continuing mission to provide you with exceptional heart care, we have created designated Provider Care Teams.  These Care Teams include your primary Cardiologist (physician) and Advanced Practice Providers (APPs -  Physician Assistants and Nurse Practitioners) who all work together to provide you with the care you need, when you need it.  We recommend signing up for the patient portal called "MyChart".  Sign up information is provided on this After Visit Summary.  MyChart is used to connect with patients for Virtual Visits (Telemedicine).  Patients are able to view lab/test results, encounter notes, upcoming appointments, etc.  Non-urgent messages can be sent to your provider as well.   To learn more about what you can do with MyChart, go to [email or web address].    Your next appointment:   As scheduled with Dr. Cristal Deer  Provider:   Jodelle Red, MD or Gillian Shields, NP    Other Instructions  Tips to Measure your Blood Pressure Correctly  Here's what you can do to ensure a correct reading:  Don't drink a caffeinated beverage or  smoke during the 30 minutes before the test.  Sit quietly for five minutes before the test begins.  During the measurement, sit in a chair with your feet on the floor and your arm supported so your elbow is at about heart level.  The inflatable part of the cuff should completely cover at least 80% of your upper arm, and the cuff should be placed on bare skin, not over a shirt.  Don't talk during the  measurement.   Blood pressure categories  Blood pressure category SYSTOLIC (upper number)  DIASTOLIC (lower number)  Normal Less than 120 mm Hg and Less than 80 mm Hg  Elevated 120-129 mm Hg and Less than 80 mm Hg  High blood pressure: Stage 1 hypertension 130-139 mm Hg or 80-89 mm Hg  High blood pressure: Stage 2 hypertension 140 mm Hg or higher or 90 mm Hg or higher  Hypertensive crisis (consult your doctor immediately) Higher than 180 mm Hg and/or Higher than 120 mm Hg  Source: American Heart Association and American Stroke Association. For more on getting your blood pressure under control, buy Controlling Your Blood Pressure, a Special Health Report from [facility].

## 2022-12-21 NOTE — Progress Notes (Signed)
Cardiology Office Note:  .   Date:  12/21/2022  ID:  Desiree [name], DOB [birth year], MRN [id number] PCP: Jearld Lesch, MD  [facility] Providers Cardiologist:  Jodelle Red, MD    History of Present Illness: .   Desiree [name] is a 47 y.o. female ESRD on HD T/TH/S, hypertension, seizures, migraine, gout, anemia, anxiety, childhood asthma, GERD, chronic respiratory failure on 3 L home O2, CAD s/p anterior STEMI 09/12/2022 with angioplasty, atrial fibrillation.   She presented 09/12/22 due to severe crushing chest pain associate with nausea vomiting.  Found to have anterior ST elevations by EMS.  Underwent LHC 09/13/2022 with finding suspected be thromboembolic event to coronaries, likely from previously undetected atrial fibrillation.  Due to occlusion of left distal OM 3 and apical LAD.  Angioplasty done to both areas but difficult to restore significant flow.  Echocardiogram LVEF 35-40%.  It also revealed calcified mobile MAC with no clinical signs of endocarditis.  She was discharged on Eliquis, Plavix.  BP limited titration of heart failure medications.   Last seen in clinic 09/28/2022 with myriad of concerns.  She was referred to psychiatry for better control of her anxiety.  She was referred to cardiac rehab.  Imdur reduced to half tablet on nondialysis days due to hypotension.  Mild volume overload due to missed HD.  Readmitted 7/18 - 10/03/2022 with chest pain.  Repeat limited echo LVEF 45-50%, no RWMA, moderate pericardial effusion without tamponade.  Chest pain felt to be related to panic attacks.  Admitted 8/20 - 11/05/2022 after presenting with palpitations.  EKG A-fib with RVR.  She started on amiodarone drip.  Was transitioned to oral amiodarone.  She presents today for follow-up with her husband.  Multiple questions on that medication list which was reviewed in detail today.  They have concerns about dialysis treatment center "pushing fluid in" as they note  nonpitting bilateral pretibial edema.  Still difficulty with hypotension when at dialysis.  She is taking midodrine sporadically.  Reports no palpitations, chest pain.  Exertional dyspnea is stable at her baseline.  ROS: Please see the history of present illness.   .hcstu All other systems reviewed and are negative.   Studies Reviewed: .        Cardiac Studies & Procedures   CARDIAC CATHETERIZATION  CARDIAC CATHETERIZATION 09/13/2022  Narrative   Lat 3rd Mrg lesion is 100% stenosed.  Balloon angioplasty was performed using a BALLN EMERGE MR 2.0X12.   Post intervention, there is a 0% residual stenosis.   Dist LAD lesion is 100% stenosed.  Balloon angioplasty was performed using a BALLN EMERGE MR 2.0X12.   Post intervention, there is a 100% residual stenosis.   Mid Cx lesion is 50% stenosed.  Thrombotic.   LV end diastolic pressure is moderately elevated.   Recommend to resume Apixaban, at currently prescribed dose and frequency.   Recommend concurrent antiplatelet therapy of Clopidogrel 75mg  daily for 12 months.  No atherosclerotic coronary artery disease.  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  Angioplasty done in both areas but neither area receiving TIMI-3 flow.  Resume heparin 6 hours after femoral sheath removal.  She will need echocardiogram to look for source of embolism.  Would consider long-term oral anticoagulation given her presentation here today.  Could also use clopidogrel for antiplatelet therapy depending on whether or not she has bleeding issues.  Will watch in 2 heart.  I spoke to her daughter, Mel Almond, and conveyed the results.  Findings Coronary Findings Diagnostic  Dominance: Right  Left Anterior Descending Dist LAD lesion is 100% stenosed.  Left Circumflex Mid Cx lesion is 50% stenosed. The lesion is thrombotic.  Lateral Third Obtuse Marginal Branch Lat 3rd Mrg lesion is 100%  stenosed.  Intervention  Dist LAD lesion Angioplasty CATH LAUNCHER 6FR EBU3.5 guide catheter was inserted. WIRE RUNTHROUGH .[id number] guidewire used to cross lesion. Balloon angioplasty was performed using a BALLN EMERGE MR 2.0X12. After the angioplasty, flow reached further in the apical LAD but there was still a cut off before the end of the vessel. Post-Intervention Lesion Assessment The intervention was unsuccessful. Pre-interventional TIMI flow is 0. Post-intervention TIMI flow is 0. There is a 100% residual stenosis post intervention.  Lat 3rd Mrg lesion Angioplasty CATH LAUNCHER 6FR EBU3.5 guide catheter was inserted. WIRE RUNTHROUGH .[id number] guidewire used to cross lesion. Balloon angioplasty was performed using a BALLN EMERGE MR 2.0X12. Post-Intervention Lesion Assessment The intervention was successful. Pre-interventional TIMI flow is 0. Post-intervention TIMI flow is 2. No complications occurred at this lesion. There is a 0% residual stenosis post intervention.     ECHOCARDIOGRAM  ECHOCARDIOGRAM COMPLETE 11/04/2022  Narrative ECHOCARDIOGRAM REPORT    Patient Name:   Desiree [name] Date of Exam: 11/04/2022 Medical Rec #:  [id number]       Height:       64.0 in Accession #:    [id number]      Weight:       164.0 lb Date of Birth:  1975/08/22        BSA:          1.798 m Patient Age:    47 years        BP:           113/89 mmHg Patient Gender: F               HR:           81 bpm. Exam Location:  Inpatient  Procedure: 2D Echo, Cardiac Doppler and Color Doppler  Indications:    Atrial fibrillation  History:        Patient has prior history of Echocardiogram examinations, most recent 10/02/2022. CHF, Arrythmias:Atrial Fibrillation, Signs/Symptoms:Hypotension; Risk Factors:Hypertension. ESRD on HD, pericardial effusion.  Sonographer:    Milda Smart Referring Phys: [id number] LAURA R INGOLD  IMPRESSIONS   1. Left ventricular ejection fraction, by estimation, is 45 to  50%. The left ventricle has mildly decreased function. The left ventricle demonstrates regional wall motion abnormalities (see scoring diagram/findings for description). Left ventricular diastolic function could not be evaluated. 2. Right ventricular systolic function is mildly reduced. The right ventricular size is normal. Tricuspid regurgitation signal is inadequate for assessing PA pressure. 3. A small pericardial effusion is present. The pericardial effusion is posterior to the left ventricle. There is no evidence of cardiac tamponade. 4. The mitral valve is degenerative. Mild mitral valve regurgitation. No evidence of mitral stenosis. Moderate mitral annular calcification. 5. Tricuspid valve regurgitation is mild to moderate. 6. The aortic valve is tricuspid. Aortic valve regurgitation is not visualized. Aortic valve sclerosis is present, with no evidence of aortic valve stenosis. 7. The inferior vena cava is normal in size with <50% respiratory variability, suggesting right atrial pressure of 8 mmHg.  Comparison(s): No significant change from prior study.  FINDINGS Left Ventricle: Left ventricular ejection fraction, by estimation, is 45 to 50%. The left ventricle has mildly decreased function.  The left ventricle demonstrates regional wall motion abnormalities. The left ventricular internal cavity size was normal in size. There is no left ventricular hypertrophy. Left ventricular diastolic function could not be evaluated due to atrial fibrillation. Left ventricular diastolic function could not be evaluated.   LV Wall Scoring: The apex is hypokinetic.  Right Ventricle: The right ventricular size is normal. No increase in right ventricular wall thickness. Right ventricular systolic function is mildly reduced. Tricuspid regurgitation signal is inadequate for assessing PA pressure.  Left Atrium: Left atrial size was normal in size.  Right Atrium: Right atrial size was normal in  size.  Pericardium: A small pericardial effusion is present. The pericardial effusion is posterior to the left ventricle. There is no evidence of cardiac tamponade.  Mitral Valve: The mitral valve is degenerative in appearance. Moderate mitral annular calcification. Mild mitral valve regurgitation. No evidence of mitral valve stenosis.  Tricuspid Valve: The tricuspid valve is grossly normal. Tricuspid valve regurgitation is mild to moderate. No evidence of tricuspid stenosis.  Aortic Valve: The aortic valve is tricuspid. Aortic valve regurgitation is not visualized. Aortic valve sclerosis is present, with no evidence of aortic valve stenosis.  Pulmonic Valve: The pulmonic valve was grossly normal. Pulmonic valve regurgitation is not visualized. No evidence of pulmonic stenosis.  Aorta: The aortic root and ascending aorta are structurally normal, with no evidence of dilitation.  Venous: The inferior vena cava is normal in size with less than 50% respiratory variability, suggesting right atrial pressure of 8 mmHg.  IAS/Shunts: The atrial septum is grossly normal.   LEFT VENTRICLE PLAX 2D LVIDd:         4.60 cm   Diastology LVIDs:         3.25 cm   LV e' medial:  6.42 cm/s LV PW:         0.90 cm   LV e' lateral: 8.27 cm/s LV IVS:        0.75 cm LVOT diam:     1.80 cm LVOT Area:     2.54 cm   RIGHT VENTRICLE RV Basal diam:  3.30 cm RV S prime:     10.40 cm/s TAPSE (M-mode): 1.7 cm  LEFT ATRIUM             Index        RIGHT ATRIUM          Index LA diam:        4.10 cm 2.28 cm/m   RA Area:     9.41 cm LA Vol (A2C):   48.1 ml 26.75 ml/m  RA Volume:   16.50 ml 9.18 ml/m LA Vol (A4C):   45.5 ml 25.30 ml/m LA Biplane Vol: 48.1 ml 26.75 ml/m  AORTA Ao Root diam: 2.90 cm Ao Asc diam:  2.70 cm   SHUNTS Systemic Diam: 1.80 cm  Lennie Odor MD Electronically signed by Lennie Odor MD Signature Date/Time: 11/04/2022/3:44:58 PM    Final             Risk  Assessment/Calculations:    CHA2DS2-VASc Score = 4   This indicates a 4.8% annual risk of stroke. The patient's score is based upon: CHF History: 1 HTN History: 1 Diabetes History: 0 Stroke History: 0 Vascular Disease History: 1 Age Score: 0 Gender Score: 1            Physical Exam:   VS:  BP 104/72   Pulse 67   Ht 5\' 4"  (1.626 m)   Wt 161  lb (73 kg)   SpO2 95%   BMI 27.64 kg/m    Wt Readings from Last 3 Encounters:  12/21/22 161 lb (73 kg)  11/05/22 173 lb 1 oz (78.5 kg)  10/03/22 164 lb 0.4 oz (74.4 kg)    GEN: Well nourished, overweight, well developed in no acute distress NECK: No JVD; No carotid bruits CARDIAC: RRR, no murmurs, rubs, gallops RESPIRATORY:  Clear to auscultation without rales, wheezing or rhonchi.  On 3 L home O2 ABDOMEN: Soft, non-tender, non-distended EXTREMITIES: Bilateral nonpitting pedal edema.; No deformity   ASSESSMENT AND PLAN: .    PAF/hypercoagulable state- RRR by auscultation.  14-day ZIO monitor placed to assess atrial fibrillation burden.  Could consider referral to EP for ablation pending results.  Reduce amiodarone from 200 mg twice daily to daily.  Will request dialysis [facility]) to collect thyroid panel, CMP with next labs and fax to our office [phone]) as she has been on Amiodarone x 6 weeks. No evidence of Amiodarone toxicity. CHA2DS2-VASc Score = 4 [CHF History: 1, HTN History: 1, Diabetes History: 0, Stroke History: 0, Vascular Disease History: 1, Age Score: 0, Gender Score: 1].  Therefore, the patient's annual risk of stroke is 4.8 %.    Continue Eliquis 5 mg twice daily.  Does not meet dose reduction criteria.  Denies bleeding applications.  [facility] Address: [address] [place], [place] [address] Phone: [phone] Fax: [phone]  Chronic systolic heart failure-nonpitting pedal edema on exam.  Volume management per dialysis.  GDMT limited by hypotension and renal disease.  CAD / HLD, LDL goal  <70 -Imdur previously discontinued due to hypotension.  No anginal symptoms, no indication for ischemic evaluation.  Continue GDMT clopidogrel, atorvastatin.  Encouraged to participate in cardiac rehab  ESRD on HD-follows with nephrology.  Pericardial effusion-chronic.  Echo 10/2022 mild pericardial effusion.  Volume management per HD.  Chronic respiratory failure with hypoxia-continue home 3 L O2.  Follows with pulmonology.  Hypotension- Now on Midodrine 10mg  PRN three times per day, unclear how frequently she is taking. Continue TID PRN for SBP <110. Phone call in 2 weeks to check in.  If BP persistently low may require more routine dosing rather than as needed dosing.    Cardiac Rehabilitation Eligibility Assessment         Dispo: follow up in 2 months  Signed, Alver Sorrow, NP

## 2022-12-23 ENCOUNTER — Telehealth (HOSPITAL_COMMUNITY): Payer: Self-pay

## 2022-12-23 ENCOUNTER — Ambulatory Visit (HOSPITAL_COMMUNITY): Payer: 59

## 2022-12-23 ENCOUNTER — Encounter (HOSPITAL_COMMUNITY): Payer: Self-pay

## 2022-12-23 NOTE — Telephone Encounter (Signed)
Attempted to call patient in regards to Cardiac Rehab - LM on VM Mailed letter 

## 2022-12-25 ENCOUNTER — Ambulatory Visit (HOSPITAL_COMMUNITY): Payer: 59

## 2022-12-28 ENCOUNTER — Ambulatory Visit (HOSPITAL_COMMUNITY): Payer: 59

## 2022-12-30 ENCOUNTER — Other Ambulatory Visit: Payer: Self-pay | Admitting: Physician Assistant

## 2022-12-30 ENCOUNTER — Telehealth: Payer: Self-pay | Admitting: Physician Assistant

## 2022-12-30 ENCOUNTER — Ambulatory Visit (HOSPITAL_COMMUNITY): Payer: 59

## 2022-12-30 NOTE — Telephone Encounter (Signed)
Pt called stating PA persons is to send valium in before her MRI appt. Please send to pharmacy on file. Pt phone number is [phone]

## 2022-12-30 NOTE — Telephone Encounter (Signed)
Called and advised pt. She stated understanding  

## 2023-01-01 ENCOUNTER — Ambulatory Visit (HOSPITAL_COMMUNITY): Payer: 59

## 2023-01-04 ENCOUNTER — Ambulatory Visit (HOSPITAL_COMMUNITY): Payer: 59

## 2023-01-04 ENCOUNTER — Encounter (HOSPITAL_BASED_OUTPATIENT_CLINIC_OR_DEPARTMENT_OTHER): Payer: Self-pay

## 2023-01-06 ENCOUNTER — Ambulatory Visit
Admission: RE | Admit: 2023-01-06 | Discharge: 2023-01-06 | Disposition: A | Discharge status: Home / Self Care | Payer: 59 | Source: Ambulatory Visit | Attending: Physician Assistant | Admitting: Physician Assistant

## 2023-01-06 ENCOUNTER — Ambulatory Visit (HOSPITAL_COMMUNITY): Payer: 59

## 2023-01-06 DIAGNOSIS — M5440 Lumbago with sciatica, unspecified side: Secondary | ICD-10-CM

## 2023-01-08 ENCOUNTER — Ambulatory Visit (HOSPITAL_COMMUNITY): Payer: 59

## 2023-01-11 ENCOUNTER — Ambulatory Visit (HOSPITAL_COMMUNITY): Payer: 59

## 2023-01-11 ENCOUNTER — Telehealth (HOSPITAL_COMMUNITY): Payer: Self-pay

## 2023-01-11 NOTE — Telephone Encounter (Signed)
No response from pt in regards to cardiac rehab. Closed referral 

## 2023-01-13 ENCOUNTER — Ambulatory Visit (HOSPITAL_COMMUNITY): Payer: 59

## 2023-01-15 ENCOUNTER — Ambulatory Visit (HOSPITAL_COMMUNITY): Payer: 59

## 2023-01-18 ENCOUNTER — Ambulatory Visit (HOSPITAL_COMMUNITY): Payer: 59

## 2023-01-20 ENCOUNTER — Ambulatory Visit (HOSPITAL_COMMUNITY): Payer: 59

## 2023-01-22 ENCOUNTER — Ambulatory Visit (HOSPITAL_COMMUNITY): Payer: 59

## 2023-01-25 ENCOUNTER — Ambulatory Visit (HOSPITAL_COMMUNITY): Payer: 59

## 2023-01-27 ENCOUNTER — Ambulatory Visit (HOSPITAL_COMMUNITY): Payer: 59

## 2023-01-29 ENCOUNTER — Ambulatory Visit (HOSPITAL_COMMUNITY): Payer: 59

## 2023-02-01 ENCOUNTER — Ambulatory Visit (HOSPITAL_COMMUNITY): Payer: 59

## 2023-02-03 ENCOUNTER — Ambulatory Visit (HOSPITAL_COMMUNITY): Payer: 59

## 2023-03-02 ENCOUNTER — Encounter (HOSPITAL_BASED_OUTPATIENT_CLINIC_OR_DEPARTMENT_OTHER): Payer: Self-pay | Admitting: Cardiology

## 2023-03-02 ENCOUNTER — Ambulatory Visit (HOSPITAL_BASED_OUTPATIENT_CLINIC_OR_DEPARTMENT_OTHER): Payer: 59 | Admitting: Cardiology

## 2023-03-02 VITALS — BP 122/62 | HR 43 | Ht 64.0 in | Wt 150.0 lb

## 2023-03-02 DIAGNOSIS — I5022 Chronic systolic (congestive) heart failure: Secondary | ICD-10-CM

## 2023-03-02 DIAGNOSIS — I252 Old myocardial infarction: Secondary | ICD-10-CM | POA: Diagnosis not present

## 2023-03-02 DIAGNOSIS — Z992 Dependence on renal dialysis: Secondary | ICD-10-CM

## 2023-03-02 DIAGNOSIS — Z79899 Other long term (current) drug therapy: Secondary | ICD-10-CM

## 2023-03-02 DIAGNOSIS — Z7901 Long term (current) use of anticoagulants: Secondary | ICD-10-CM

## 2023-03-02 DIAGNOSIS — Z86718 Personal history of other venous thrombosis and embolism: Secondary | ICD-10-CM | POA: Diagnosis not present

## 2023-03-02 DIAGNOSIS — N186 End stage renal disease: Secondary | ICD-10-CM

## 2023-03-02 DIAGNOSIS — D6859 Other primary thrombophilia: Secondary | ICD-10-CM

## 2023-03-02 DIAGNOSIS — I480 Paroxysmal atrial fibrillation: Secondary | ICD-10-CM

## 2023-03-02 NOTE — Progress Notes (Signed)
Cardiology Office Note:  .   Date:  03/02/2023  ID:  Desiree [name], DOB [birth year], MRN [id number] PCP: Jearld Lesch, MD  [facility] Providers Cardiologist:  Jodelle Red, MD {  History of Present Illness: .   Desiree [name] is a 47 y.o. female ESRD on HD T/TH/S, hypertension, seizures, migraines, chronic respiratory failure on 3 L home O2, CAD s/p thrombotic anterior STEMI 09/12/2022 with angioplasty, atrial fibrillation.   Pertinent CV history: She presented 09/12/22 due to severe crushing chest pain associate with nausea and vomiting.  Found to have anterior ST elevations by EMS.  Underwent LHC 09/13/2022 with finding suspected be thromboembolic event to coronaries, likely from previously undetected atrial fibrillation. Occlusion of left distal OM 3 and apical LAD noted, angioplasty performed but minimal flow improvement. Echocardiogram LVEF 35-40%.  It also revealed calcified mobile MAC with no clinical signs of endocarditis.  She was discharged on Eliquis, Plavix.  BP limited titration of heart failure medications.   Readmitted 7/18 - 10/03/2022 with chest pain.  Repeat limited echo LVEF 45-50%, no RWMA, moderate pericardial effusion without tamponade.  Chest pain felt to be related to panic attacks.   Admitted 8/20 - 11/05/2022 after presenting with palpitations.  EKG A-fib with RVR.  She was started on amiodarone drip.  Was transitioned to oral amiodarone.  Today: Checking BP and HR every day, both are intermittently low. No similar angina to her presentation in June, but has panic attacks intermittently that do not feel like her chest pain. She feels nervous, nauseated, "like I am running away from myself."   Having hypotension with dialysis, trying to make adjustments to help this. She and her husband have struggled with changes to her dry weight, and they feel that she gets fluid "pushed back in" as her post HD weight is higher than her pre HD weight.   She  remains on chronic O2 by nasal cannula.  Reviewed monitor results, no significant arrhythmias. Symptomatic event was sinus.  ROS: Denies chest pain, PND, orthopnea, syncope or palpitations.   Studies Reviewed: Marland Kitchen    EKG:       Physical Exam:   VS:  BP 122/62 (BP Location: Right Arm, Patient Position: Sitting, Cuff Size: Normal)   Pulse (!) 43   Ht 5\' 4"  (1.626 m)   Wt 150 lb (68 kg)   SpO2 91%   BMI 25.75 kg/m    Wt Readings from Last 3 Encounters:  03/02/23 150 lb (68 kg)  12/21/22 161 lb (73 kg)  11/05/22 173 lb 1 oz (78.5 kg)    GEN: Chronically ill appearing, on O2 by nasal cannula HEENT: Normal, moist mucous membranes NECK: No JVD CARDIAC: regular rhythm, normal S1 and S2, no rubs or gallops. No murmur. VASCULAR: Radial and DP pulses 2+ bilaterally. No carotid bruits RESPIRATORY:  Clear to auscultation without rales, wheezing or rhonchi  ABDOMEN: Soft, non-tender, non-distended MUSCULOSKELETAL:  Ambulates independently SKIN: Warm and dry, trivial bilateral LE edema NEUROLOGIC:  Alert and oriented x 3. No focal neuro deficits noted. PSYCHIATRIC:  Normal affect    ASSESSMENT AND PLAN: .    Paroxysmal atrial fibrillation History of arterial embolism (coronaries) -now on long term amiodarone for rhythm management -CHA2DS2/VAS Stroke Risk Points=4  -continue long term apixaban -reviewed monitor today, no significant arrhythmias  Chronic systolic heart failure -volume management by HD -chronic hypotension prevents use of GDMT  ESRD on HD Hypotension with dialysis -has midodrine PRN but has not used, reviewed  indications for this today  STEMI 08/2022 from embolization of thrombus in apical LAD and OM3 -had balloon but no return of flow in distal LAD, slow flow in OM3  Dispo: 3 months or sooner as needed  Total time of encounter: I spent 45 minutes dedicated to the care of this patient on the date of this encounter to include pre-visit review of records,  face-to-face time with the patient discussing conditions above, and clinical documentation with the electronic health record. We specifically spent time today discussing medications at length, symptoms, hospitalization, symptoms to watch for  Jodelle Red, MD, PhD, [facility]  [facility]  [place]  [facility] & V[facility] at [facility] [address], [address] [place], [place] [address] [phone]    Signed, Jodelle Red, MD   Jodelle Red, MD, PhD, [facility] [place]  [facility]  [place]  Heart & Vascular at [facility] [address] [place], [place] [address] [phone]

## 2023-03-02 NOTE — Patient Instructions (Addendum)
Heart medications: Amiodarone 200 mg ONCE A DAY (heart rhythm/prevent afib) Apixaban 5 mg TWICE A DAY (blood thinner) Atorvastatin 80 mg ONCE A DAY (cholesterol medication) Clopidogrel 75 mg ONCE A DAY (antiplatelet, works with the blood thinner)  Midodrine is as needed for low blood pressure.  Follow up in 3 months

## 2023-04-02 ENCOUNTER — Encounter (HOSPITAL_COMMUNITY)
Admission: RE | Disposition: A | Discharge status: Home / Self Care | Payer: Self-pay | Source: Home / Self Care | Attending: Nephrology

## 2023-04-02 ENCOUNTER — Other Ambulatory Visit: Payer: Self-pay

## 2023-04-02 ENCOUNTER — Ambulatory Visit (HOSPITAL_COMMUNITY)
Admission: RE | Admit: 2023-04-02 | Discharge: 2023-04-02 | Disposition: A | Discharge status: Home / Self Care | Payer: 59 | Attending: Nephrology | Admitting: Nephrology

## 2023-04-02 DIAGNOSIS — Z7901 Long term (current) use of anticoagulants: Secondary | ICD-10-CM | POA: Diagnosis not present

## 2023-04-02 DIAGNOSIS — I5022 Chronic systolic (congestive) heart failure: Secondary | ICD-10-CM | POA: Diagnosis not present

## 2023-04-02 DIAGNOSIS — Z7722 Contact with and (suspected) exposure to environmental tobacco smoke (acute) (chronic): Secondary | ICD-10-CM | POA: Insufficient documentation

## 2023-04-02 DIAGNOSIS — Z87891 Personal history of nicotine dependence: Secondary | ICD-10-CM | POA: Diagnosis not present

## 2023-04-02 DIAGNOSIS — J9611 Chronic respiratory failure with hypoxia: Secondary | ICD-10-CM | POA: Diagnosis not present

## 2023-04-02 DIAGNOSIS — I132 Hypertensive heart and chronic kidney disease with heart failure and with stage 5 chronic kidney disease, or end stage renal disease: Secondary | ICD-10-CM | POA: Diagnosis not present

## 2023-04-02 DIAGNOSIS — Z992 Dependence on renal dialysis: Secondary | ICD-10-CM | POA: Diagnosis not present

## 2023-04-02 DIAGNOSIS — Z8249 Family history of ischemic heart disease and other diseases of the circulatory system: Secondary | ICD-10-CM | POA: Insufficient documentation

## 2023-04-02 DIAGNOSIS — N186 End stage renal disease: Secondary | ICD-10-CM | POA: Diagnosis present

## 2023-04-02 HISTORY — PX: A/V FISTULAGRAM: CATH118298

## 2023-04-02 SURGERY — A/V FISTULAGRAM
Anesthesia: LOCAL

## 2023-04-02 MED ORDER — METHYLPREDNISOLONE SODIUM SUCC 125 MG IJ SOLR
INTRAMUSCULAR | Status: DC | PRN
  Administered 2023-04-02: 125 mg via INTRAVENOUS

## 2023-04-02 MED ORDER — DIPHENHYDRAMINE HCL 50 MG/ML IJ SOLN
INTRAMUSCULAR | Status: DC | PRN
  Administered 2023-04-02: 50 mg via INTRAVENOUS

## 2023-04-02 MED ORDER — LIDOCAINE HCL (PF) 1 % IJ SOLN
INTRAMUSCULAR | Status: AC
  Filled 2023-04-02: qty 30

## 2023-04-02 MED ORDER — IODIXANOL 320 MG/ML IV SOLN
INTRAVENOUS | Status: DC | PRN
  Administered 2023-04-02: 5 mL

## 2023-04-02 MED ORDER — LIDOCAINE HCL (PF) 1 % IJ SOLN
INTRAMUSCULAR | Status: DC | PRN
  Administered 2023-04-02: 2 mL via INTRADERMAL

## 2023-04-02 MED ORDER — HEPARIN (PORCINE) IN NACL 1000-0.9 UT/500ML-% IV SOLN
INTRAVENOUS | Status: DC | PRN
  Administered 2023-04-02: 500 mL

## 2023-04-02 MED ORDER — DIPHENHYDRAMINE HCL 50 MG/ML IJ SOLN
INTRAMUSCULAR | Status: AC
  Filled 2023-04-02: qty 1

## 2023-04-02 MED ORDER — METHYLPREDNISOLONE SODIUM SUCC 125 MG IJ SOLR
INTRAMUSCULAR | Status: AC
  Filled 2023-04-02: qty 2

## 2023-04-02 SURGICAL SUPPLY — 6 items
BAG SNAP BAND KOVER 36X36 (MISCELLANEOUS) ×1 IMPLANT
COVER DOME SNAP 22 D (MISCELLANEOUS) ×1 IMPLANT
KIT MICROPUNCTURE NIT STIFF (SHEATH) IMPLANT
STOPCOCK MORSE 400PSI 3WAY (MISCELLANEOUS) IMPLANT
TRAY PV CATH (CUSTOM PROCEDURE TRAY) ×1 IMPLANT
TUBING CIL FLEX 10 FLL-RA (TUBING) IMPLANT

## 2023-04-02 NOTE — Op Note (Signed)
Patient referred for the evaluation and management of intermittent prolonged cannulation site bleeding as well as an abnormal/change in physical exam of her left brachiocephalic fistula.  This fistula was created in October 2023 and previous angiogram shows a tortuous but patent arteriovenous fistula that did not require intervention.  She has allergy to shellfish and iodinated intravenous contrast as well as prednisone; she was premedicated on the table with Solu-Medrol 125 mg as well as Benadryl 50 mg intravenously after obtaining intravenous access.   Summary:  1)      The patient had a normal fistulogram of her left brachiocephalic fistula that shows a widely patent fistula that is tortuous but without focal stenosis. 2)      this access remains amenable to percutaneous intervention.Marland Kitchen   Description of procedure: The arm was prepped and draped in the usual sterile fashion. The left brachiocephalic fistula was cannulated (16109) with a micropuncture needle an antegrade direction in arterial limb of the fistula and switched out for a micro sheath. Contrast [phone]) injection via the port of the sheath was performed. The angiogram of the fistula (09811) showed a patent body of the fistula that is tortuous with a patent cephalic arch and left-sided central veins.  Reflux angiogram not done because of rapid flows and the desire to limit contrast exposure.   No targets seen for endovascular intervention.   Hemostasis: Obtained by manual pressure at the cannulation site on removal of the sheath.   Sedation: 0 mg, 0 mcg. Sedation time: 0 minutes   Contrast.  4 mL   Monitoring: Because of the patient's comorbid conditions and sedation during the procedure, continuous EKG monitoring and O2 saturation monitoring was performed throughout the procedure by the RN. There were no abnormal arrhythmias encountered.   Complications: None   Diagnoses: I87.1 Stricture of vein  N18.6 ESRD   Procedure Coding:   36901 Cannulation and angiogram of fistula, (left brachiocephalic fistula)  B1478 Contrast   Recommendations:  1. Continue to cannulate the fistula with 15G needles.  2. Refer for problems with flows/swelling.   Discharge: The patient was discharged home in stable condition. The patient was given education regarding the care of the dialysis access AVF and specific instructions in case of any problems.

## 2023-04-02 NOTE — H&P (Signed)
Desiree [name] is an 48 y.o. female.   Chief Complaint: Abnormal exam of left brachiocephalic fistula HPI:  48 year old woman with past medical history significant for hypertension, HFrEF, atrial fibrillation on anticoagulation with Eliquis, chronic respiratory failure on oxygen supplementation via nasal cannula (3 L/minute) and end-stage renal disease on hemodialysis.  She presents for concerns of abnormal physical exam of her left brachiocephalic fistula which to her has felt "more knotty and pulsatile".  She last had a fistulogram in October 2023 by vascular surgery that showed a tortuous and well-developed fistula without focal stenosis.  She denies any chest pain and has chronic exertional dyspnea.  She denies any nausea, vomiting or diarrhea.  She denies any pedal edema.  The angiogram/angioplasty procedure was explained to her and she is willing to proceed.  Past Medical History:  Diagnosis Date   Anemia    Anxiety    panic attacks   Arthritis    bilateral knees   Childhood asthma    Complication of anesthesia    "sometimes it does not work; didn't during LEEP OR" (01/21/2016)   Depression    no med   ESRD (end stage renal disease) on dialysis (HCC)    "TTS; [facility]; [facility]" (08/03/2016)   GERD (gastroesophageal reflux disease)    nexium prn   Gout    History of blood transfusion    "related to kidneys; I've had 4" (01/21/2016)   Hypertension    Hypertensive urgency 12/05/2021   Migraine    last one 01/18/19   Preterm labor ~ 2014   Retina hole, left    Seizures (HCC)    "last one was in 2000; related to preeclampsia" (01/21/2016)    Past Surgical History:  Procedure Laterality Date   A/V FISTULAGRAM Left 12/19/2021   Procedure: A/V Fistulagram;  Surgeon: Chuck Hint, MD;  Location: [facility] INVASIVE CV LAB;  Service: Cardiovascular;  Laterality: Left;   AV FISTULA PLACEMENT Left 2010   AV FISTULA PLACEMENT Left 01/20/2019   Procedure: ARTERIOVENOUS (AV)  FISTULA CREATION LEFT UPPER ARM;  Surgeon: Chuck Hint, MD;  Location: [facility] OR;  Service: Vascular;  Laterality: Left;   AV FISTULA PLACEMENT Left 12/19/2021   Procedure: ARTERIOVENOUS (AV) FISTULA CREATION POSSIBLE TUNNELED DIALYSIS;  Surgeon: Maeola Harman, MD;  Location: [facility] OR;  Service: Vascular;  Laterality: Left;   CERVICAL BIOPSY  W/ LOOP ELECTRODE EXCISION  2001   CORONARY/GRAFT ACUTE MI REVASCULARIZATION N/A 09/12/2022   Procedure: Coronary/Graft Acute MI Revascularization;  Surgeon: Corky Crafts, MD;  Location: [facility] INVASIVE CV LAB;  Service: Cardiovascular;  Laterality: N/A;   DILATION AND EVACUATION  08/02/2011   Procedure: DILATATION AND EVACUATION;  Surgeon: Oliver Pila, MD;  Location: [facility] ORS;  Service: Gynecology;;   DILATION AND EVACUATION N/A 08/31/2013   Procedure: DILATATION AND EVACUATION;  Surgeon: Adam Phenix, MD;  Location: [facility] ORS;  Service: Gynecology;  Laterality: N/A;   FISTULA SUPERFICIALIZATION Left 01/20/2019   Procedure: Fistula Superficialization;  Surgeon: Chuck Hint, MD;  Location: [facility] OR;  Service: Vascular;  Laterality: Left;   HYDRADENITIS EXCISION Right    INSERTION OF DIALYSIS CATHETER Right 12/19/2021   Procedure: INSERTION OF A TUNNELED DIALYSIS CATHETER;  Surgeon: Maeola Harman, MD;  Location: [facility] OR;  Service: Vascular;  Laterality: Right;   LEFT HEART CATH AND CORONARY ANGIOGRAPHY N/A 09/12/2022   Procedure: LEFT HEART CATH AND CORONARY ANGIOGRAPHY;  Surgeon: Corky Crafts, MD;  Location: [facility] INVASIVE CV LAB;  Service: Cardiovascular;  Laterality: N/A;   RENAL BIOPSY     REVISION OF ARTERIOVENOUS GORETEX GRAFT Left 02/11/2013   Procedure: REVISION OF ARTERIOVENOUS GORTEX FISTULA;  Surgeon: Larina Earthly, MD;  Location: [facility] OR;  Service: Vascular;  Laterality: Left;   THROMBECTOMY W/ EMBOLECTOMY Left 05/25/2018   Procedure: REPAIR OF BLEEDING ARTERIOVENOUS FISTULA;  Surgeon: Nada Libman, MD;   Location: [facility] OR;  Service: Vascular;  Laterality: Left;    Family History  Problem Relation Age of Onset   Diabetes Mother    Hyperlipidemia Mother    Hypertension Mother    Heart disease Mother    Hypertension Other    Diabetes type II Other    Social History:  reports that she has quit smoking. Her smoking use included cigarettes. She has been exposed to tobacco smoke. She has never used smokeless tobacco. She reports current drug use. Drug: Marijuana. She reports that she does not drink alcohol.  Allergies:  Allergies  Allergen Reactions   Iron Sucrose Itching    Other Reaction(s): C/O of Being Hot, headache   Morphine Shortness Of Breath and Anaphylaxis   Prednisone Other (See Comments)    Other reaction(s): Other (See Comments) Muscle spasms Patient says prednisone causes her to cramp all over, muscle spasms uncontrolled   Tuna [Fish Allergy] Itching, Swelling, Rash and Other (See Comments)    Face droops also   Amlodipine Other (See Comments)    Angioedema (09/05/17 ED visit)   Iodinated Contrast Media Itching   Tape Itching    Adhesive tape   paper tape ok    Medications Prior to Admission  Medication Sig Dispense Refill   acetaminophen (TYLENOL) 325 MG tablet Take 325 mg by mouth every 6 (six) hours as needed for mild pain (pain score 1-3) or headache.     albuterol (PROVENTIL HFA;VENTOLIN HFA) 108 (90 Base) MCG/ACT inhaler Inhale 2 puffs into the lungs every 4 (four) hours as needed for wheezing or shortness of breath. 1 Inhaler 2   ALPRAZolam (XANAX) 1 MG tablet Take 1 tablet (1 mg total) by mouth 3 (three) times daily as needed for anxiety. Take 30 minutes prior to MRI (Patient taking differently: Take 1 mg by mouth 3 (three) times daily. Take 30 minutes prior to MRI) 1 tablet 0   amiodarone (PACERONE) 200 MG tablet Take 1 tablet (200 mg total) by mouth daily. 30 tablet 5   apixaban (ELIQUIS) 5 MG TABS tablet Take 1 tablet (5 mg total) by mouth 2 (two) times daily.  180 tablet 1   atorvastatin (LIPITOR) 80 MG tablet Take 1 tablet (80 mg total) by mouth daily. 90 tablet 1   clopidogrel (PLAVIX) 75 MG tablet Take 1 tablet (75 mg total) by mouth daily with breakfast. 90 tablet 3   cyclobenzaprine (FLEXERIL) 10 MG tablet Take 10 mg by mouth 2 (two) times daily as needed for muscle spasms.     ferric citrate (AURYXIA) 1 GM 210 MG(Fe) tablet Take 1 tablet (210 mg total) by mouth 3 (three) times daily with meals. (Patient taking differently: Take 420 mg by mouth 3 (three) times daily with meals.) 90 tablet 1   fluticasone furoate-vilanterol (BREO ELLIPTA) 100-25 MCG/ACT AEPB Inhale 1 puff into the lungs daily. 60 each 2   folic acid-vitamin b complex-vitamin c-selenium-zinc (DIALYVITE) 3 MG TABS tablet Take 1 tablet by mouth daily.     lactulose (CHRONULAC) 10 GM/15ML solution Take 30 mLs by mouth daily as needed for mild constipation, moderate  constipation or severe constipation.     midodrine (PROAMATINE) 10 MG tablet Take 10 mg by mouth 3 (three) times daily as needed (Hypotension).     NARCAN 4 MG/0.1ML LIQD nasal spray kit Place 1 spray into the nose as needed (accidental overdose).     nystatin cream (MYCOSTATIN) Apply 1 Application topically daily.     ondansetron (ZOFRAN-ODT) 8 MG disintegrating tablet Take 16 mg by mouth daily as needed for nausea or refractory nausea / vomiting.     OVER THE COUNTER MEDICATION Place 1 drop into both eyes daily as needed (dry eyes). Always eye drops     Oxycodone HCl 10 MG TABS Take 1 tablet (10 mg total) by mouth 3 (three) times daily as needed (severe pain). (Patient taking differently: Take 15 mg by mouth 3 (three) times daily as needed (For pain).) 5 tablet 0   pantoprazole (PROTONIX) 40 MG tablet Take 1 tablet (40 mg total) by mouth daily. (Patient taking differently: Take 20 mg by mouth daily.) 90 tablet 1   sevelamer carbonate (RENVELA) 800 MG tablet Take 1,600 mg by mouth 3 (three) times daily with meals.      cinacalcet (SENSIPAR) 30 MG tablet Take 2 tablets (60 mg total) by mouth daily with supper. (Patient not taking: Reported on 03/31/2023) 60 tablet 1    No results found for this or any previous visit (from the past 48 hours). No results found.  Review of Systems  Respiratory:  Positive for shortness of breath.        Chronic hypoxic respiratory failure  All other systems reviewed and are negative.   Last menstrual period 08/14/2021. Physical Exam Vitals and nursing note reviewed.  Constitutional:      Appearance: Normal appearance. She is normal weight.     Comments: Walking stick at her side, patient wearing oxygen via nasal cannula  HENT:     Head: Normocephalic and atraumatic.     Right Ear: External ear normal.     Left Ear: External ear normal.     Mouth/Throat:     Mouth: Mucous membranes are dry.     Pharynx: Oropharynx is clear.  Eyes:     Extraocular Movements: Extraocular movements intact.     Conjunctiva/sclera: Conjunctivae normal.  Cardiovascular:     Rate and Rhythm: Normal rate. Rhythm irregular.     Pulses: Normal pulses.     Heart sounds: Normal heart sounds.  Pulmonary:     Effort: Pulmonary effort is normal. No respiratory distress.     Breath sounds: No wheezing.  Abdominal:     General: Abdomen is flat. Bowel sounds are normal.     Palpations: Abdomen is soft.  Musculoskeletal:     Cervical back: Normal range of motion and neck supple.     Right lower leg: No edema.     Left lower leg: No edema.     Comments: Tortuous left brachiocephalic fistula  Neurological:     Mental Status: She is alert.      Assessment/Plan 1.  Abnormal exam of left brachiocephalic fistula: With intermittently prolonged cannulation site bleeding with ongoing use of Eliquis.  Fistulogram to be undertaken today for evaluation of lesions needing intervention.  Patient consent obtained. 2.  End-stage renal disease: Continue hemodialysis on TTS schedule following completion of  procedure. 3.  Chronic hypoxic respiratory failure: Monitoring shows decent oxygen saturation with ongoing supplementation via nasal cannula.  Continuous monitoring through procedure. 4.  Anemia: Continue on anemia management/ESA per  dialysis protocol.  Dagoberto Ligas, MD 04/02/2023, 12:49 PM

## 2023-04-02 NOTE — Discharge Instructions (Signed)
Okay to DC home ANY time after 2:10PM as long as clinically stable

## 2023-04-05 ENCOUNTER — Encounter (HOSPITAL_COMMUNITY): Payer: Self-pay | Admitting: Nephrology

## 2023-04-27 NOTE — Progress Notes (Signed)
Referral received from [facility] for elevated Calcium. Reviewed with Dr Leonides Schanz who asked if we can get more information. Fax sent requesting SPEP results and Light Chains. Will review when information is received.

## 2023-04-30 NOTE — Progress Notes (Signed)
After review of the information with Dr Leonides Schanz it was determined that there is no indication for evaluation. Per Dr Leonides Schanz, patient does not have an "M" spike protein and the ratio of Kappa, Lambda Light chains are with in the limits for a dialysis patient. Will be available for any future needs.

## 2023-05-06 ENCOUNTER — Encounter: Payer: Self-pay | Admitting: Nephrology

## 2023-05-08 ENCOUNTER — Encounter (HOSPITAL_COMMUNITY): Payer: Self-pay | Admitting: Family Medicine

## 2023-05-08 ENCOUNTER — Inpatient Hospital Stay (HOSPITAL_COMMUNITY)
Admission: EM | Admit: 2023-05-08 | Discharge: 2023-05-10 | DRG: 193 | Disposition: A | Discharge status: Home / Self Care | Payer: 59 | Attending: Internal Medicine | Admitting: Internal Medicine

## 2023-05-08 ENCOUNTER — Other Ambulatory Visit: Payer: Self-pay

## 2023-05-08 ENCOUNTER — Emergency Department (HOSPITAL_COMMUNITY): Payer: 59

## 2023-05-08 DIAGNOSIS — I2510 Atherosclerotic heart disease of native coronary artery without angina pectoris: Secondary | ICD-10-CM | POA: Diagnosis present

## 2023-05-08 DIAGNOSIS — Z992 Dependence on renal dialysis: Secondary | ICD-10-CM

## 2023-05-08 DIAGNOSIS — I5022 Chronic systolic (congestive) heart failure: Secondary | ICD-10-CM | POA: Diagnosis present

## 2023-05-08 DIAGNOSIS — J45901 Unspecified asthma with (acute) exacerbation: Secondary | ICD-10-CM | POA: Diagnosis present

## 2023-05-08 DIAGNOSIS — D631 Anemia in chronic kidney disease: Secondary | ICD-10-CM | POA: Diagnosis present

## 2023-05-08 DIAGNOSIS — Z8249 Family history of ischemic heart disease and other diseases of the circulatory system: Secondary | ICD-10-CM | POA: Diagnosis not present

## 2023-05-08 DIAGNOSIS — Z91041 Radiographic dye allergy status: Secondary | ICD-10-CM | POA: Diagnosis not present

## 2023-05-08 DIAGNOSIS — Z833 Family history of diabetes mellitus: Secondary | ICD-10-CM

## 2023-05-08 DIAGNOSIS — Z9981 Dependence on supplemental oxygen: Secondary | ICD-10-CM | POA: Diagnosis not present

## 2023-05-08 DIAGNOSIS — Z87891 Personal history of nicotine dependence: Secondary | ICD-10-CM

## 2023-05-08 DIAGNOSIS — K219 Gastro-esophageal reflux disease without esophagitis: Secondary | ICD-10-CM | POA: Diagnosis present

## 2023-05-08 DIAGNOSIS — I132 Hypertensive heart and chronic kidney disease with heart failure and with stage 5 chronic kidney disease, or end stage renal disease: Secondary | ICD-10-CM | POA: Diagnosis present

## 2023-05-08 DIAGNOSIS — J101 Influenza due to other identified influenza virus with other respiratory manifestations: Secondary | ICD-10-CM | POA: Diagnosis present

## 2023-05-08 DIAGNOSIS — Z789 Other specified health status: Secondary | ICD-10-CM | POA: Diagnosis not present

## 2023-05-08 DIAGNOSIS — I480 Paroxysmal atrial fibrillation: Secondary | ICD-10-CM | POA: Diagnosis present

## 2023-05-08 DIAGNOSIS — Z7901 Long term (current) use of anticoagulants: Secondary | ICD-10-CM

## 2023-05-08 DIAGNOSIS — Z1152 Encounter for screening for COVID-19: Secondary | ICD-10-CM | POA: Diagnosis not present

## 2023-05-08 DIAGNOSIS — N2581 Secondary hyperparathyroidism of renal origin: Secondary | ICD-10-CM | POA: Diagnosis present

## 2023-05-08 DIAGNOSIS — J9611 Chronic respiratory failure with hypoxia: Secondary | ICD-10-CM | POA: Diagnosis present

## 2023-05-08 DIAGNOSIS — Z888 Allergy status to other drugs, medicaments and biological substances status: Secondary | ICD-10-CM

## 2023-05-08 DIAGNOSIS — N186 End stage renal disease: Secondary | ICD-10-CM | POA: Diagnosis present

## 2023-05-08 DIAGNOSIS — I252 Old myocardial infarction: Secondary | ICD-10-CM

## 2023-05-08 DIAGNOSIS — Z83438 Family history of other disorder of lipoprotein metabolism and other lipidemia: Secondary | ICD-10-CM | POA: Diagnosis not present

## 2023-05-08 DIAGNOSIS — D696 Thrombocytopenia, unspecified: Secondary | ICD-10-CM | POA: Diagnosis present

## 2023-05-08 DIAGNOSIS — Z885 Allergy status to narcotic agent status: Secondary | ICD-10-CM

## 2023-05-08 DIAGNOSIS — N185 Chronic kidney disease, stage 5: Secondary | ICD-10-CM

## 2023-05-08 DIAGNOSIS — J111 Influenza due to unidentified influenza virus with other respiratory manifestations: Principal | ICD-10-CM | POA: Diagnosis present

## 2023-05-08 DIAGNOSIS — Z79899 Other long term (current) drug therapy: Secondary | ICD-10-CM

## 2023-05-08 DIAGNOSIS — Z91013 Allergy to seafood: Secondary | ICD-10-CM

## 2023-05-08 DIAGNOSIS — I4891 Unspecified atrial fibrillation: Secondary | ICD-10-CM | POA: Diagnosis present

## 2023-05-08 LAB — CBC
HCT: 30.4 % — ABNORMAL LOW (ref 36.0–46.0)
Hemoglobin: 9.3 g/dL — ABNORMAL LOW (ref 12.0–15.0)
MCH: 24.8 pg — ABNORMAL LOW (ref 26.0–34.0)
MCHC: 30.6 g/dL (ref 30.0–36.0)
MCV: 81.1 fL (ref 80.0–100.0)
Platelets: 146 10*3/uL — ABNORMAL LOW (ref 150–400)
RBC: 3.75 MIL/uL — ABNORMAL LOW (ref 3.87–5.11)
RDW: 20.2 % — ABNORMAL HIGH (ref 11.5–15.5)
WBC: 4.6 10*3/uL (ref 4.0–10.5)
nRBC: 0 % (ref 0.0–0.2)

## 2023-05-08 LAB — COMPREHENSIVE METABOLIC PANEL
ALT: 24 U/L (ref 0–44)
AST: 21 U/L (ref 15–41)
Albumin: 3.1 g/dL — ABNORMAL LOW (ref 3.5–5.0)
Alkaline Phosphatase: 78 U/L (ref 38–126)
Anion gap: 18 — ABNORMAL HIGH (ref 5–15)
BUN: 74 mg/dL — ABNORMAL HIGH (ref 6–20)
CO2: 24 mmol/L (ref 22–32)
Calcium: 9.6 mg/dL (ref 8.9–10.3)
Chloride: 96 mmol/L — ABNORMAL LOW (ref 98–111)
Creatinine, Ser: 16.06 mg/dL — ABNORMAL HIGH (ref 0.44–1.00)
GFR, Estimated: 2 mL/min — ABNORMAL LOW (ref >60)
Glucose, Bld: 84 mg/dL (ref 70–99)
Potassium: 4.6 mmol/L (ref 3.5–5.1)
Sodium: 138 mmol/L (ref 135–145)
Total Bilirubin: 1 mg/dL (ref 0.0–1.2)
Total Protein: 7.7 g/dL (ref 6.5–8.1)

## 2023-05-08 LAB — LIPASE, BLOOD: Lipase: 65 U/L — ABNORMAL HIGH (ref 11–51)

## 2023-05-08 LAB — RESP PANEL BY RT-PCR (RSV, FLU A&B, COVID)  RVPGX2
Influenza A by PCR: POSITIVE — AB
Influenza B by PCR: NEGATIVE
Resp Syncytial Virus by PCR: NEGATIVE
SARS Coronavirus 2 by RT PCR: NEGATIVE

## 2023-05-08 LAB — HCG, SERUM, QUALITATIVE: Preg, Serum: NEGATIVE

## 2023-05-08 LAB — BRAIN NATRIURETIC PEPTIDE: B Natriuretic Peptide: 531.8 pg/mL — ABNORMAL HIGH (ref 0.0–100.0)

## 2023-05-08 MED ORDER — PANTOPRAZOLE SODIUM 40 MG PO TBEC
40.0000 mg | DELAYED_RELEASE_TABLET | Freq: Every day | ORAL | Status: DC
Start: 2023-05-09 — End: 2023-05-10
  Administered 2023-05-09 – 2023-05-10 (×2): 40 mg via ORAL
  Filled 2023-05-08 (×2): qty 1

## 2023-05-08 MED ORDER — FLUTICASONE FUROATE-VILANTEROL 100-25 MCG/ACT IN AEPB: 1.0000 | INHALATION_SPRAY | Freq: Every day | RESPIRATORY_TRACT | Status: DC

## 2023-05-08 MED ORDER — APIXABAN 5 MG PO TABS
5.0000 mg | ORAL_TABLET | Freq: Two times a day (BID) | ORAL | Status: DC
  Administered 2023-05-08 – 2023-05-10 (×4): 5 mg via ORAL
  Filled 2023-05-08 (×4): qty 1

## 2023-05-08 MED ORDER — CLOPIDOGREL BISULFATE 75 MG PO TABS
75.0000 mg | ORAL_TABLET | Freq: Every day | ORAL | Status: DC
  Administered 2023-05-09 – 2023-05-10 (×2): 75 mg via ORAL
  Filled 2023-05-08 (×2): qty 1

## 2023-05-08 MED ORDER — IPRATROPIUM-ALBUTEROL 0.5-2.5 (3) MG/3ML IN SOLN
3.0000 mL | Freq: Once | RESPIRATORY_TRACT | Status: AC
  Administered 2023-05-08: 3 mL via RESPIRATORY_TRACT
  Filled 2023-05-08: qty 3

## 2023-05-08 MED ORDER — ONDANSETRON HCL 4 MG/2ML IJ SOLN
4.0000 mg | Freq: Once | INTRAMUSCULAR | Status: AC
  Administered 2023-05-08: 4 mg via INTRAVENOUS
  Filled 2023-05-08: qty 2

## 2023-05-08 MED ORDER — AMIODARONE HCL 200 MG PO TABS
200.0000 mg | ORAL_TABLET | Freq: Every day | ORAL | Status: DC
  Administered 2023-05-09 – 2023-05-10 (×2): 200 mg via ORAL
  Filled 2023-05-08 (×2): qty 1

## 2023-05-08 MED ORDER — PROCHLORPERAZINE EDISYLATE 10 MG/2ML IJ SOLN: 5.0000 mg | Freq: Four times a day (QID) | INTRAMUSCULAR | Status: DC | PRN

## 2023-05-08 MED ORDER — SODIUM CHLORIDE 0.9% FLUSH
3.0000 mL | Freq: Two times a day (BID) | INTRAVENOUS | Status: DC
  Administered 2023-05-08 – 2023-05-10 (×4): 3 mL via INTRAVENOUS

## 2023-05-08 MED ORDER — DEXAMETHASONE SODIUM PHOSPHATE 10 MG/ML IJ SOLN
8.0000 mg | Freq: Once | INTRAMUSCULAR | Status: AC
  Administered 2023-05-08: 8 mg via INTRAVENOUS
  Filled 2023-05-08: qty 1

## 2023-05-08 MED ORDER — ALBUTEROL SULFATE (2.5 MG/3ML) 0.083% IN NEBU
2.5000 mg | INHALATION_SOLUTION | RESPIRATORY_TRACT | Status: DC | PRN
Start: 2023-05-08 — End: 2023-05-09

## 2023-05-08 MED ORDER — OXYCODONE HCL 5 MG PO TABS
5.0000 mg | ORAL_TABLET | Freq: Three times a day (TID) | ORAL | Status: DC | PRN
  Administered 2023-05-08 – 2023-05-10 (×4): 10 mg via ORAL
  Filled 2023-05-08 (×4): qty 2

## 2023-05-08 MED ORDER — GUAIFENESIN 100 MG/5ML PO LIQD: 5.0000 mL | ORAL | Status: DC | PRN

## 2023-05-08 MED ORDER — ACETAMINOPHEN 650 MG RE SUPP: 650.0000 mg | Freq: Four times a day (QID) | RECTAL | Status: DC | PRN

## 2023-05-08 MED ORDER — METHYLPREDNISOLONE 16 MG PO TABS
32.0000 mg | ORAL_TABLET | Freq: Every day | ORAL | Status: DC
  Filled 2023-05-08: qty 2
  Filled 2023-05-08: qty 1

## 2023-05-08 MED ORDER — ACETAMINOPHEN 325 MG PO TABS
650.0000 mg | ORAL_TABLET | Freq: Four times a day (QID) | ORAL | Status: DC | PRN
  Administered 2023-05-09: 650 mg via ORAL
  Filled 2023-05-08: qty 2

## 2023-05-08 MED ORDER — OSELTAMIVIR PHOSPHATE 30 MG PO CAPS
30.0000 mg | ORAL_CAPSULE | Freq: Once | ORAL | Status: AC
  Administered 2023-05-08: 30 mg via ORAL
  Filled 2023-05-08: qty 1

## 2023-05-08 MED ORDER — ATORVASTATIN CALCIUM 80 MG PO TABS
80.0000 mg | ORAL_TABLET | Freq: Every day | ORAL | Status: DC
  Administered 2023-05-09 – 2023-05-10 (×2): 80 mg via ORAL
  Filled 2023-05-08: qty 1
  Filled 2023-05-08: qty 2

## 2023-05-08 MED ORDER — OSELTAMIVIR PHOSPHATE 30 MG PO CAPS
30.0000 mg | ORAL_CAPSULE | ORAL | Status: DC
  Filled 2023-05-08: qty 1

## 2023-05-08 MED ORDER — OSELTAMIVIR PHOSPHATE 30 MG PO CAPS: 30.0000 mg | ORAL_CAPSULE | ORAL | Status: DC | PRN

## 2023-05-08 MED ORDER — ALPRAZOLAM 0.5 MG PO TABS
1.0000 mg | ORAL_TABLET | Freq: Three times a day (TID) | ORAL | Status: DC | PRN
  Administered 2023-05-08 – 2023-05-09 (×2): 1 mg via ORAL
  Filled 2023-05-08: qty 2
  Filled 2023-05-08: qty 4

## 2023-05-08 MED ORDER — CHLORHEXIDINE GLUCONATE CLOTH 2 % EX PADS
6.0000 | MEDICATED_PAD | Freq: Every day | CUTANEOUS | Status: DC
  Administered 2023-05-09 – 2023-05-10 (×2): 6 via TOPICAL

## 2023-05-08 NOTE — ED Provider Notes (Signed)
 [facility] EMERGENCY DEPARTMENT AT [facility] Provider Note   CSN: [id number] Arrival date & time: 05/08/23  1332    History  Chief Complaint  Patient presents with   Emesis    Desiree [name] is a 48 y.o. female history of A-fib, CHF, ESRD here for evaluation of not feeling well.  States her entire house has been sick for 1 week.  She has had persistent nausea, vomiting, diarrhea and generalized weakness.  She also feels more short of breath than normal.  She states she is post be using inhalers at home however does not.  Wears 3 L nasal cannula at baseline.  She does not make any urine.  No blood in stool.  She has productive cough.  She missed dialysis Thursday as well as today, last dialysis session on Tuesday.  Unable to keep down her home meds for 2 days  HPI     Home Medications Prior to Admission medications   Medication Sig Start Date End Date Taking? Authorizing Provider  acetaminophen (TYLENOL) 325 MG tablet Take 325 mg by mouth every 6 (six) hours as needed for mild pain (pain score 1-3) or headache.    [provider]  albuterol (PROVENTIL HFA;VENTOLIN HFA) 108 (90 Base) MCG/ACT inhaler Inhale 2 puffs into the lungs every 4 (four) hours as needed for wheezing or shortness of breath. 05/02/16   Lavera Guise, MD  ALPRAZolam Prudy Feeler) 1 MG tablet Take 1 tablet (1 mg total) by mouth 3 (three) times daily as needed for anxiety. Take 30 minutes prior to MRI Patient taking differently: Take 1 mg by mouth 3 (three) times daily. Take 30 minutes prior to MRI 11/17/22   Persons, [place], PA  amiodarone (PACERONE) 200 MG tablet Take 1 tablet (200 mg total) by mouth daily. 12/21/22 06/19/23  Alver Sorrow, NP  apixaban (ELIQUIS) 5 MG TABS tablet Take 1 tablet (5 mg total) by mouth 2 (two) times daily. 09/28/22   Alver Sorrow, NP  atorvastatin (LIPITOR) 80 MG tablet Take 1 tablet (80 mg total) by mouth daily. 09/28/22   Alver Sorrow, NP  cinacalcet (SENSIPAR)  30 MG tablet Take 2 tablets (60 mg total) by mouth daily with supper. Patient not taking: Reported on 03/31/2023 09/17/22   Corky Crafts, MD  clopidogrel (PLAVIX) 75 MG tablet Take 1 tablet (75 mg total) by mouth daily with breakfast. 09/17/22   Jonita Albee, PA-C  cyclobenzaprine (FLEXERIL) 10 MG tablet Take 10 mg by mouth 2 (two) times daily as needed for muscle spasms.    [provider]  ferric citrate (AURYXIA) 1 GM 210 MG(Fe) tablet Take 1 tablet (210 mg total) by mouth 3 (three) times daily with meals. Patient taking differently: Take 420 mg by mouth 3 (three) times daily with meals. 09/17/22   Corky Crafts, MD  fluticasone furoate-vilanterol (BREO ELLIPTA) 100-25 MCG/ACT AEPB Inhale 1 puff into the lungs daily. 04/30/22   Oretha Milch, MD  folic acid-vitamin b complex-vitamin c-selenium-zinc (DIALYVITE) 3 MG TABS tablet Take 1 tablet by mouth daily.    [provider]  lactulose (CHRONULAC) 10 GM/15ML solution Take 30 mLs by mouth daily as needed for mild constipation, moderate constipation or severe constipation. 09/04/21   [provider]  midodrine (PROAMATINE) 10 MG tablet Take 10 mg by mouth 3 (three) times daily as needed (Hypotension). 09/03/22   [provider]  NARCAN 4 MG/0.1ML LIQD nasal spray kit Place 1 spray into  the nose as needed (accidental overdose). 11/14/18   [provider]  nystatin cream (MYCOSTATIN) Apply 1 Application topically daily.    [provider]  ondansetron (ZOFRAN-ODT) 8 MG disintegrating tablet Take 16 mg by mouth daily as needed for nausea or refractory nausea / vomiting.    [provider]  OVER THE COUNTER MEDICATION Place 1 drop into both eyes daily as needed (dry eyes). Always eye drops    [provider]  Oxycodone HCl 10 MG TABS Take 1 tablet (10 mg total) by mouth 3 (three) times daily as needed (severe pain). Patient taking differently: Take 15 mg by mouth 3  (three) times daily as needed (For pain). 12/22/21   Arrien, York Ram, MD  pantoprazole (PROTONIX) 40 MG tablet Take 1 tablet (40 mg total) by mouth daily. Patient taking differently: Take 20 mg by mouth daily. 09/28/22 04/02/23  Alver Sorrow, NP  sevelamer carbonate (RENVELA) 800 MG tablet Take 1,600 mg by mouth 3 (three) times daily with meals. 05/18/19   [provider]      Allergies    Iron sucrose, Morphine, Prednisone, Tuna [fish allergy], Amlodipine, Iodinated contrast media, and Tape    Review of Systems   Review of Systems  Constitutional:  Positive for activity change, appetite change, fatigue and fever.  HENT:  Positive for congestion, postnasal drip and rhinorrhea.   Respiratory:  Positive for cough and shortness of breath.   Cardiovascular: Negative.   Gastrointestinal:  Positive for abdominal pain, diarrhea, nausea and vomiting.  Genitourinary: Negative.   Musculoskeletal:  Positive for myalgias.  Skin: Negative.   Neurological:  Positive for weakness (generalized).  All other systems reviewed and are negative.   Physical Exam Updated Vital Signs BP 117/63   Pulse 63   Temp 99.5 F (37.5 C) (Axillary)   Resp 11   Ht 5\' 5"  (1.651 m)   Wt 70 kg   SpO2 100%   BMI 25.68 kg/m  Physical Exam Vitals and nursing note reviewed.  Constitutional:      General: She is not in acute distress.    Appearance: She is well-developed. She is ill-appearing (chronically ill appearing). She is not toxic-appearing or diaphoretic.  HENT:     Head: Normocephalic and atraumatic.     Nose: Congestion and rhinorrhea present.  Eyes:     Pupils: Pupils are equal, round, and reactive to light.  Cardiovascular:     Rate and Rhythm: Normal rate.     Pulses: Normal pulses.     Heart sounds: Normal heart sounds.  Pulmonary:     Effort: No respiratory distress.     Breath sounds: Wheezing and rhonchi present.  Abdominal:     General: Bowel sounds are normal. There is  no distension.     Palpations: Abdomen is soft.     Tenderness: There is no abdominal tenderness. There is no right CVA tenderness, left CVA tenderness, guarding or rebound.  Musculoskeletal:        General: No swelling, tenderness, deformity or signs of injury. Normal range of motion.     Cervical back: Normal range of motion.     Right lower leg: No edema.     Left lower leg: No edema.     Comments: Dialysis graft left upper extremity palpable thrill  Skin:    General: Skin is warm and dry.     Capillary Refill: Capillary refill takes less than 2 seconds.  Neurological:     General: No  focal deficit present.     Mental Status: She is alert and oriented to person, place, and time.     Cranial Nerves: No cranial nerve deficit.     Motor: No weakness.  Psychiatric:        Mood and Affect: Mood normal.    ED Results / Procedures / Treatments   Labs (all labs ordered are listed, but only abnormal results are displayed) Labs Reviewed  RESP PANEL BY RT-PCR (RSV, FLU A&B, COVID)  RVPGX2 - Abnormal; Notable for the following components:      Result Value   Influenza A by PCR POSITIVE (*)    All other components within normal limits  LIPASE, BLOOD - Abnormal; Notable for the following components:   Lipase 65 (*)    All other components within normal limits  COMPREHENSIVE METABOLIC PANEL - Abnormal; Notable for the following components:   Chloride 96 (*)    BUN 74 (*)    Creatinine, Ser 16.06 (*)    Albumin 3.1 (*)    GFR, Estimated 2 (*)    Anion gap 18 (*)    All other components within normal limits  CBC - Abnormal; Notable for the following components:   RBC 3.75 (*)    Hemoglobin 9.3 (*)    HCT 30.4 (*)    MCH 24.8 (*)    RDW 20.2 (*)    Platelets 146 (*)    All other components within normal limits  BRAIN NATRIURETIC PEPTIDE - Abnormal; Notable for the following components:   B Natriuretic Peptide 531.8 (*)    All other components within normal limits  HCG, SERUM,  QUALITATIVE  BASIC METABOLIC PANEL  CBC    EKG EKG Interpretation Date/Time:  Saturday May 08 2023 18:27:23 EST Ventricular Rate:  67 PR Interval:  130 QRS Duration:  97 QT Interval:  445 QTC Calculation: 470 R Axis:   -11  Text Interpretation: Sinus rhythm Borderline low voltage, extremity leads Consider anterior infarct Baseline wander in lead(s) V3 afib no longer present Confirmed by Pricilla Loveless [phone]) on 05/08/2023 7:44:35 PM  Radiology DG Chest Portable 1 View Result Date: 05/08/2023 CLINICAL DATA:  Short of breath EXAM: PORTABLE CHEST 1 VIEW COMPARISON:  11/03/2022 FINDINGS: Single frontal view of the chest demonstrates stable enlargement of the cardiac silhouette. There is increased pulmonary vascular congestion, without overt edema. No effusion or pneumothorax. No acute bony abnormalities. IMPRESSION: 1. Pulmonary vascular congestion without overt edema. 2. Stable enlarged cardiac silhouette. Electronically Signed   By: Sharlet Salina M.D.   On: 05/08/2023 16:49    Procedures Procedures    Medications Ordered in ED Medications  amiodarone (PACERONE) tablet 200 mg (has no administration in time range)  atorvastatin (LIPITOR) tablet 80 mg (has no administration in time range)  ALPRAZolam (XANAX) tablet 1 mg (has no administration in time range)  pantoprazole (PROTONIX) EC tablet 40 mg (has no administration in time range)  apixaban (ELIQUIS) tablet 5 mg (has no administration in time range)  clopidogrel (PLAVIX) tablet 75 mg (has no administration in time range)  fluticasone furoate-vilanterol (BREO ELLIPTA) 100-25 MCG/ACT 1 puff (has no administration in time range)  albuterol (PROVENTIL) (2.5 MG/3ML) 0.083% nebulizer solution 2.5 mg (has no administration in time range)  sodium chloride flush (NS) 0.9 % injection 3 mL (has no administration in time range)  acetaminophen (TYLENOL) tablet 650 mg (has no administration in time range)    Or  acetaminophen (TYLENOL)  suppository 650 mg (has no administration  in time range)  oxyCODONE (Oxy IR/ROXICODONE) immediate release tablet 5-10 mg (has no administration in time range)  prochlorperazine (COMPAZINE) injection 5 mg (has no administration in time range)  guaiFENesin (ROBITUSSIN) 100 MG/5ML liquid 5 mL (has no administration in time range)  ipratropium-albuterol (DUONEB) 0.5-2.5 (3) MG/3ML nebulizer solution 3 mL (3 mLs Nebulization Given 05/08/23 1657)  dexamethasone (DECADRON) injection 8 mg (8 mg Intravenous Given 05/08/23 1725)  ondansetron (ZOFRAN) injection 4 mg (4 mg Intravenous Given 05/08/23 1726)    ED Course/ Medical Decision Making/ A&P   48 year old here for evaluation of feeling unwell.  She has complicated medical history at baseline.  On arrival she is afebrile, without tachycardia, tachypnea or hypoxia however she does appear ill.  She has missed her last 2 dialysis sessions due to being unwell.  She has expiratory wheeze on exam.  Diffuse abdominal tenderness over no focal pain.  Some dry heaving.  Plan on labs, imaging and reassess  Labs and imaging personally viewed and interpreted:  CBC without leukocytosis, Hgb 9.3 at baseline CMP creatinine 16.06, anion gap 18 Lipase 65 Viral panel positive for Flu A Preg neg Xray chest congestion without overt edema BNP 531  Patient reassessed.  We discussed her labs and imaging.  Still has some persistent nausea however no vomiting.  Her breathing has improved after DuoNebs and steroids however she does sound wet as well as rhonchorous.  I suspect her shortness of breath is likely multifactorial given her influenza, wheezing as well as her missed dialysis.  She does appear clinically dry however will hold on IV fluids given her missed dialysis.  Will touch base with nephrology.  Likely benefit from admission for continued management.  CONSULT with Dr. Arta Silence with Nephrology.  Will see in consult, states patient likely does not need emergent dialysis  right now.  Will hold on IV fluids until she has been assessed by nephrology  Discussed with Dr. Antionette Char with medicine will evaluate patient for admission  The patient appears reasonably stabilized for admission considering the current resources, flow, and capabilities available in the ED at this time, and I doubt any other [facility] requiring further screening and/or treatment in the ED prior to admission.                                 Medical Decision Making Amount and/or Complexity of Data Reviewed External Data Reviewed: labs, radiology, ECG and notes. Labs: ordered. Decision-making details documented in ED Course. Radiology: ordered and independent interpretation performed. Decision-making details documented in ED Course. ECG/medicine tests: ordered and independent interpretation performed. Decision-making details documented in ED Course.  Risk OTC drugs. Prescription drug management. Parenteral controlled substances. Decision regarding hospitalization. Diagnosis or treatment significantly limited by social determinants of health.         Final Clinical Impression(s) / ED Diagnoses Final diagnoses:  Influenza  ESRD (end stage renal disease) [facility])    Rx / DC Orders ED Discharge Orders     None         Amberle Lyter A, PA-C 05/08/23 2035    Pricilla Loveless, MD 05/12/23 1100

## 2023-05-08 NOTE — Consult Note (Signed)
 Renal Service Consult Note [name]  Desiree [name] 05/08/2023 Desiree Krabbe, MD Requesting Physician: Dr. Criss Alvine  Reason for Consult: ESRD pt c/o flu, sickness all week, n/v/d, missed HD HPI: The patient is a 48 y.o. year-old w/ PMH as below who presented to ED c/o sickness x 1 week, the "whole family" has been sick. Persistent N/V, diarrhea and gen'd weakness. More SOB then usual (on home O2 3 L). Missed HD Thursday and today. Last session Tuesday. Unable to keep down her home meds the last 2 days. In ED BP 120/65, HR 65, RR 15-23, temp 99.5.  3 L [place] O2, 100%.  Na 138  K 4.6 co2 24  BUN 74  creat 16.  Alb 3.1  Ca 9.6  wbc 4K  Hb 9.3. She rec'd DuoNebs and steroids and her breathing improved. Pt will be admitted. We are asked to see for ESRD.    Pt seen in ED room. Has been sick since Wed, coughing and wheezing, nauseous, vomiting and diarrhea. Lives w/ her dtr and her husband. Takes transportation to HD.    PMH Anemia  Anxiety Depression ESRD on HD Gout HTN HTNsive urgency Seizures   ROS - denies CP, no joint pain, no HA, no blurry vision, no rash, no diarrhea, no nausea/ vomiting, no dysuria, no difficulty voiding   Past Medical History  Past Medical History:  Diagnosis Date   Anemia    Anxiety    panic attacks   Arthritis    bilateral knees   Childhood asthma    Complication of anesthesia    "sometimes it does not work; didn't during LEEP OR" (01/21/2016)   Depression    no med   ESRD (end stage renal disease) on dialysis (HCC)    "TTS; [facility]; [facility]" (08/03/2016)   GERD (gastroesophageal reflux disease)    nexium prn   Gout    History of blood transfusion    "related to kidneys; I've had 4" (01/21/2016)   Hypertension    Hypertensive urgency 12/05/2021   Migraine    last one 01/18/19   Preterm labor ~ 2014   Retina hole, left    Seizures (HCC)    "last one was in 2000; related to preeclampsia" (01/21/2016)   Past  Surgical History  Past Surgical History:  Procedure Laterality Date   A/V FISTULAGRAM Left 12/19/2021   Procedure: A/V Fistulagram;  Surgeon: Chuck Hint, MD;  Location: [facility] INVASIVE CV LAB;  Service: Cardiovascular;  Laterality: Left;   A/V FISTULAGRAM N/A 04/02/2023   Procedure: A/V Fistulagram;  Surgeon: Dagoberto Ligas, MD;  Location: [facility] INVASIVE CV LAB;  Service: Cardiovascular;  Laterality: N/A;   AV FISTULA PLACEMENT Left 2010   AV FISTULA PLACEMENT Left 01/20/2019   Procedure: ARTERIOVENOUS (AV) FISTULA CREATION LEFT UPPER ARM;  Surgeon: Chuck Hint, MD;  Location: [facility] OR;  Service: Vascular;  Laterality: Left;   AV FISTULA PLACEMENT Left 12/19/2021   Procedure: ARTERIOVENOUS (AV) FISTULA CREATION POSSIBLE TUNNELED DIALYSIS;  Surgeon: Maeola Harman, MD;  Location: [facility] OR;  Service: Vascular;  Laterality: Left;   CERVICAL BIOPSY  W/ LOOP ELECTRODE EXCISION  2001   CORONARY/GRAFT ACUTE MI REVASCULARIZATION N/A 09/12/2022   Procedure: Coronary/Graft Acute MI Revascularization;  Surgeon: Corky Crafts, MD;  Location: [facility] INVASIVE CV LAB;  Service: Cardiovascular;  Laterality: N/A;   DILATION AND EVACUATION  08/02/2011   Procedure: DILATATION AND EVACUATION;  Surgeon: Oliver Pila, MD;  Location: [facility] ORS;  Service: Gynecology;;   DILATION AND EVACUATION N/A 08/31/2013   Procedure: DILATATION AND EVACUATION;  Surgeon: Adam Phenix, MD;  Location: [facility] ORS;  Service: Gynecology;  Laterality: N/A;   FISTULA SUPERFICIALIZATION Left 01/20/2019   Procedure: Fistula Superficialization;  Surgeon: Chuck Hint, MD;  Location: [facility] OR;  Service: Vascular;  Laterality: Left;   HYDRADENITIS EXCISION Right    INSERTION OF DIALYSIS CATHETER Right 12/19/2021   Procedure: INSERTION OF A TUNNELED DIALYSIS CATHETER;  Surgeon: Maeola Harman, MD;  Location: [facility] OR;  Service: Vascular;  Laterality: Right;   LEFT HEART CATH AND CORONARY ANGIOGRAPHY N/A 09/12/2022    Procedure: LEFT HEART CATH AND CORONARY ANGIOGRAPHY;  Surgeon: Corky Crafts, MD;  Location: [facility] INVASIVE CV LAB;  Service: Cardiovascular;  Laterality: N/A;   RENAL BIOPSY     REVISION OF ARTERIOVENOUS GORETEX GRAFT Left 02/11/2013   Procedure: REVISION OF ARTERIOVENOUS GORTEX FISTULA;  Surgeon: Larina Earthly, MD;  Location: [facility] OR;  Service: Vascular;  Laterality: Left;   THROMBECTOMY W/ EMBOLECTOMY Left 05/25/2018   Procedure: REPAIR OF BLEEDING ARTERIOVENOUS FISTULA;  Surgeon: Nada Libman, MD;  Location: [facility] OR;  Service: Vascular;  Laterality: Left;   Family History  Family History  Problem Relation Age of Onset   Diabetes Mother    Hyperlipidemia Mother    Hypertension Mother    Heart disease Mother    Hypertension Other    Diabetes type II Other    Social History  reports that she has quit smoking. Her smoking use included cigarettes. She has been exposed to tobacco smoke. She has never used smokeless tobacco. She reports current drug use. Drug: Marijuana. She reports that she does not drink alcohol. Allergies  Allergies  Allergen Reactions   Iron Sucrose Itching    Other Reaction(s): C/O of Being Hot, headache   Morphine Shortness Of Breath and Anaphylaxis   Prednisone Other (See Comments)    Other reaction(s): Other (See Comments) Muscle spasms Patient says prednisone causes her to cramp all over, muscle spasms uncontrolled   Tuna [Fish Allergy] Itching, Swelling, Rash and Other (See Comments)    Face droops also   Amlodipine Other (See Comments)    Angioedema (09/05/17 ED visit)   Iodinated Contrast Media Itching   Tape Itching    Adhesive tape   paper tape ok   Home medications Prior to Admission medications   Medication Sig Start Date End Date Taking? Authorizing Provider  acetaminophen (TYLENOL) 325 MG tablet Take 325 mg by mouth every 6 (six) hours as needed for mild pain (pain score 1-3) or headache.    [provider]  albuterol (PROVENTIL  HFA;VENTOLIN HFA) 108 (90 Base) MCG/ACT inhaler Inhale 2 puffs into the lungs every 4 (four) hours as needed for wheezing or shortness of breath. 05/02/16   Lavera Guise, MD  ALPRAZolam Prudy Feeler) 1 MG tablet Take 1 tablet (1 mg total) by mouth 3 (three) times daily as needed for anxiety. Take 30 minutes prior to MRI Patient taking differently: Take 1 mg by mouth 3 (three) times daily. Take 30 minutes prior to MRI 11/17/22   Persons, [place], PA  amiodarone (PACERONE) 200 MG tablet Take 1 tablet (200 mg total) by mouth daily. 12/21/22 06/19/23  Alver Sorrow, NP  apixaban (ELIQUIS) 5 MG TABS tablet Take 1 tablet (5 mg total) by mouth 2 (two) times daily. 09/28/22   Alver Sorrow, NP  atorvastatin (LIPITOR) 80 MG tablet Take 1 tablet (80  mg total) by mouth daily. 09/28/22   Alver Sorrow, NP  cinacalcet (SENSIPAR) 30 MG tablet Take 2 tablets (60 mg total) by mouth daily with supper. Patient not taking: Reported on 03/31/2023 09/17/22   Corky Crafts, MD  clopidogrel (PLAVIX) 75 MG tablet Take 1 tablet (75 mg total) by mouth daily with breakfast. 09/17/22   Jonita Albee, PA-C  cyclobenzaprine (FLEXERIL) 10 MG tablet Take 10 mg by mouth 2 (two) times daily as needed for muscle spasms.    [provider]  ferric citrate (AURYXIA) 1 GM 210 MG(Fe) tablet Take 1 tablet (210 mg total) by mouth 3 (three) times daily with meals. Patient taking differently: Take 420 mg by mouth 3 (three) times daily with meals. 09/17/22   Corky Crafts, MD  fluticasone furoate-vilanterol (BREO ELLIPTA) 100-25 MCG/ACT AEPB Inhale 1 puff into the lungs daily. 04/30/22   Oretha Milch, MD  folic acid-vitamin b complex-vitamin c-selenium-zinc (DIALYVITE) 3 MG TABS tablet Take 1 tablet by mouth daily.    [provider]  lactulose (CHRONULAC) 10 GM/15ML solution Take 30 mLs by mouth daily as needed for mild constipation, moderate constipation or severe constipation. 09/04/21   [provider]  midodrine (PROAMATINE) 10 MG tablet Take 10 mg by mouth 3 (three) times daily as needed (Hypotension). 09/03/22   [provider]  NARCAN 4 MG/0.1ML LIQD nasal spray kit Place 1 spray into the nose as needed (accidental overdose). 11/14/18   [provider]  nystatin cream (MYCOSTATIN) Apply 1 Application topically daily.    [provider]  ondansetron (ZOFRAN-ODT) 8 MG disintegrating tablet Take 16 mg by mouth daily as needed for nausea or refractory nausea / vomiting.    [provider]  OVER THE COUNTER MEDICATION Place 1 drop into both eyes daily as needed (dry eyes). Always eye drops    [provider]  Oxycodone HCl 10 MG TABS Take 1 tablet (10 mg total) by mouth 3 (three) times daily as needed (severe pain). Patient taking differently: Take 15 mg by mouth 3 (three) times daily as needed (For pain). 12/22/21   Arrien, York Ram, MD  pantoprazole (PROTONIX) 40 MG tablet Take 1 tablet (40 mg total) by mouth daily. Patient taking differently: Take 20 mg by mouth daily. 09/28/22 04/02/23  Alver Sorrow, NP  sevelamer carbonate (RENVELA) 800 MG tablet Take 1,600 mg by mouth 3 (three) times daily with meals. 05/18/19   [provider]     Vitals:   05/08/23 1340 05/08/23 1430 05/08/23 1727 05/08/23 1825  BP: 121/64 120/72  117/63  Pulse: 65 65  66  Resp: 18 17  (!) 23  Temp:   98.9 F (37.2 C) 99.5 F (37.5 C)  TempSrc:   Oral Axillary  SpO2: 100% 100%  100%  Weight:      Height:       Exam Gen alert, no distress No rash, cyanosis or gangrene Sclera anicteric, throat clear  No jvd or bruits Chest bilat rhonchi and exp wheezing, scattered mild crackles bilat RRR no MRG Abd soft ntnd no mass or ascites +bs GU defer MS no joint effusions or deformity Ext no pitting LE or UE edema, no other edema Neuro is alert, Ox 3 , nf    LUA AVF+bruit       Renal-related home meds: - sensipar 60mg  every day - auryxia 420 ac  tid - midodrine 10 tid prn - renvela 1600 ac tid  OP HD: SW TTS 3h  B400  70.5kg  2K bath  LUA AVF  Heparin 6600 - records pend   ED 120/70  HR 65 RR 9- 23  temp 99.5  K + 4.6 CO2 24  BUN 74  creat 16   alb 3.1   wbc 4K  Hb 9.3   plt 146    CXR IMPRESSION - possibly pulmonary vascular congestion. No edema. Stable enlarged cardiac silhouette.    Influenza A +    COVID neg / flu B neg/ RSV negative  Assessment/ Plan: Influenza A/ acute asthma exacerbation/ chronic hypoxic resp failure - starting tamiflu, cont systemic steroids, albuterol nebs prn, supp O2, supportive care per pmd. Is on 3L O2 at home.  N/V/D - symptomatic Rx per pmd ESRD - on HD TTS. Missed Thursday and today's HD. Does not look acutely ill. Orders are in for sometime tonight or tomorrow since missed HD x 2.  HTN - bp's 120/80, no bp lowering meds.  Volume - doesn't look vol overloaded. May be a bit dry, but would hold off on IVF"s until daylight hours tomorrow.  Anemia of esrd - Hb 9.3, no esa needs. Will follow.  Secondary hyperparathyroidism - CCa in range, add on phos.  Atrial fibrillation - per pmd      Vinson Moselle  MD [facility] 05/08/2023, 7:16 PM  Recent Labs  Lab 05/08/23 1403  HGB 9.3*  ALBUMIN 3.1*  CALCIUM 9.6  CREATININE 16.06*  K 4.6   Inpatient medications:

## 2023-05-08 NOTE — ED Notes (Signed)
 Given 4oz cranberry juice per request.

## 2023-05-08 NOTE — ED Triage Notes (Signed)
 Patient with n/v/d and generalized weakness x 1 week. Missed Thursday and Saturday dialysis. Unable to tolerate PO intake, even prescribed meds. Family members with whom she lives have flu.

## 2023-05-08 NOTE — ED Notes (Signed)
 RN provided pt BSC and pericare supplies.

## 2023-05-08 NOTE — H&P (Addendum)
 History and Physical    KENORA SPAYD WUJ:[id number] DOB: [birth year] DOA: 05/08/2023  PCP: Jearld Lesch, MD   Patient coming from: Home   Chief Complaint: Cough, wheezing, N/V/D   HPI: Desiree [name] is a 48 y.o. 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  Patient reports that her daughter had been sick and then she herself developed cough, general malaise, nausea, vomiting, and diarrhea on 05/05/2023.  She has continued to experience frequent nonproductive cough, wheezing, nausea, occasional vomiting, and loose stools.  She missed her last 2 dialysis appointments due to the current illness.  ED Course: Upon arrival to the ED, patient is found to be afebrile and saturating 100% on 3 L/min of supplemental oxygen with mild tachypnea and stable blood pressure.  Labs are most notable for BUN 74, normal potassium, normal serum bicarbonate, hemoglobin 9.3, and positive influenza A PCR.  Chest x-ray notable for vascular congestion.  Nephrology was consulted by the ED PA and the patient was treated with Decadron, DuoNeb, and Zofran.  Review of Systems:  All other systems reviewed and apart from HPI, are negative.  Past Medical History:  Diagnosis Date   Anemia    Anxiety    panic attacks   Arthritis    bilateral knees   Childhood asthma    Complication of anesthesia    "sometimes it does not work; didn't during LEEP OR" (01/21/2016)   Depression    no med   ESRD (end stage renal disease) on dialysis (HCC)    "TTS; [facility]; [facility]" (08/03/2016)   GERD (gastroesophageal reflux disease)    nexium prn   Gout    History of blood transfusion    "related to kidneys; I've had 4" (01/21/2016)   Hypertension    Hypertensive urgency 12/05/2021    Migraine    last one 01/18/19   Preterm labor ~ 2014   Retina hole, left    Seizures (HCC)    "last one was in 2000; related to preeclampsia" (01/21/2016)    Past Surgical History:  Procedure Laterality Date   A/V FISTULAGRAM Left 12/19/2021   Procedure: A/V Fistulagram;  Surgeon: Chuck Hint, MD;  Location: [facility] INVASIVE CV LAB;  Service: Cardiovascular;  Laterality: Left;   A/V FISTULAGRAM N/A 04/02/2023   Procedure: A/V Fistulagram;  Surgeon: Dagoberto Ligas, MD;  Location: [facility] INVASIVE CV LAB;  Service: Cardiovascular;  Laterality: N/A;   AV FISTULA PLACEMENT Left 2010   AV FISTULA PLACEMENT Left 01/20/2019   Procedure: ARTERIOVENOUS (AV) FISTULA CREATION LEFT UPPER ARM;  Surgeon: Chuck Hint, MD;  Location: [facility] OR;  Service: Vascular;  Laterality: Left;   AV FISTULA PLACEMENT Left 12/19/2021   Procedure: ARTERIOVENOUS (AV) FISTULA CREATION POSSIBLE TUNNELED DIALYSIS;  Surgeon: Maeola Harman, MD;  Location: [facility] OR;  Service: Vascular;  Laterality: Left;   CERVICAL BIOPSY  W/ LOOP ELECTRODE EXCISION  2001   CORONARY/GRAFT ACUTE MI REVASCULARIZATION N/A 09/12/2022   Procedure: Coronary/Graft Acute MI Revascularization;  Surgeon: Corky Crafts, MD;  Location: [facility] INVASIVE CV LAB;  Service: Cardiovascular;  Laterality: N/A;   DILATION AND EVACUATION  08/02/2011   Procedure: DILATATION AND EVACUATION;  Surgeon: Oliver Pila, MD;  Location: [facility] ORS;  Service: Gynecology;;   DILATION AND EVACUATION N/A 08/31/2013   Procedure: DILATATION AND  EVACUATION;  Surgeon: Adam Phenix, MD;  Location: [facility] ORS;  Service: Gynecology;  Laterality: N/A;   FISTULA SUPERFICIALIZATION Left 01/20/2019   Procedure: Fistula Superficialization;  Surgeon: Chuck Hint, MD;  Location: [facility] OR;  Service: Vascular;  Laterality: Left;   HYDRADENITIS EXCISION Right    INSERTION OF DIALYSIS CATHETER Right 12/19/2021   Procedure: INSERTION OF A TUNNELED DIALYSIS CATHETER;  Surgeon:  Maeola Harman, MD;  Location: [facility] OR;  Service: Vascular;  Laterality: Right;   LEFT HEART CATH AND CORONARY ANGIOGRAPHY N/A 09/12/2022   Procedure: LEFT HEART CATH AND CORONARY ANGIOGRAPHY;  Surgeon: Corky Crafts, MD;  Location: [facility] INVASIVE CV LAB;  Service: Cardiovascular;  Laterality: N/A;   RENAL BIOPSY     REVISION OF ARTERIOVENOUS GORETEX GRAFT Left 02/11/2013   Procedure: REVISION OF ARTERIOVENOUS GORTEX FISTULA;  Surgeon: Larina Earthly, MD;  Location: [facility] OR;  Service: Vascular;  Laterality: Left;   THROMBECTOMY W/ EMBOLECTOMY Left 05/25/2018   Procedure: REPAIR OF BLEEDING ARTERIOVENOUS FISTULA;  Surgeon: Nada Libman, MD;  Location: [facility] OR;  Service: Vascular;  Laterality: Left;    Social History:   reports that she has quit smoking. Her smoking use included cigarettes. She has been exposed to tobacco smoke. She has never used smokeless tobacco. She reports current drug use. Drug: Marijuana. She reports that she does not drink alcohol.  Allergies  Allergen Reactions   Iron Sucrose Itching    Other Reaction(s): C/O of Being Hot, headache   Morphine Shortness Of Breath and Anaphylaxis   Prednisone Other (See Comments)    Other reaction(s): Other (See Comments) Muscle spasms Patient says prednisone causes her to cramp all over, muscle spasms uncontrolled   Tuna [Fish Allergy] Itching, Swelling, Rash and Other (See Comments)    Face droops also   Amlodipine Other (See Comments)    Angioedema (09/05/17 ED visit)   Iodinated Contrast Media Itching   Tape Itching    Adhesive tape   paper tape ok    Family History  Problem Relation Age of Onset   Diabetes Mother    Hyperlipidemia Mother    Hypertension Mother    Heart disease Mother    Hypertension Other    Diabetes type II Other      Prior to Admission medications   Medication Sig Start Date End Date Taking? Authorizing Provider  acetaminophen (TYLENOL) 325 MG tablet Take 325 mg by mouth every 6 (six)  hours as needed for mild pain (pain score 1-3) or headache.    [provider]  albuterol (PROVENTIL HFA;VENTOLIN HFA) 108 (90 Base) MCG/ACT inhaler Inhale 2 puffs into the lungs every 4 (four) hours as needed for wheezing or shortness of breath. 05/02/16   Lavera Guise, MD  ALPRAZolam Prudy Feeler) 1 MG tablet Take 1 tablet (1 mg total) by mouth 3 (three) times daily as needed for anxiety. Take 30 minutes prior to MRI Patient taking differently: Take 1 mg by mouth 3 (three) times daily. Take 30 minutes prior to MRI 11/17/22   Persons, [place], PA  amiodarone (PACERONE) 200 MG tablet Take 1 tablet (200 mg total) by mouth daily. 12/21/22 06/19/23  Alver Sorrow, NP  apixaban (ELIQUIS) 5 MG TABS tablet Take 1 tablet (5 mg total) by mouth 2 (two) times daily. 09/28/22   Alver Sorrow, NP  atorvastatin (LIPITOR) 80 MG tablet Take 1 tablet (80 mg total) by mouth daily. 09/28/22   Alver Sorrow, NP  cinacalcet [facility])  30 MG tablet Take 2 tablets (60 mg total) by mouth daily with supper. Patient not taking: Reported on 03/31/2023 09/17/22   Corky Crafts, MD  clopidogrel (PLAVIX) 75 MG tablet Take 1 tablet (75 mg total) by mouth daily with breakfast. 09/17/22   Jonita Albee, PA-C  cyclobenzaprine (FLEXERIL) 10 MG tablet Take 10 mg by mouth 2 (two) times daily as needed for muscle spasms.    [provider]  ferric citrate (AURYXIA) 1 GM 210 MG(Fe) tablet Take 1 tablet (210 mg total) by mouth 3 (three) times daily with meals. Patient taking differently: Take 420 mg by mouth 3 (three) times daily with meals. 09/17/22   Corky Crafts, MD  fluticasone furoate-vilanterol (BREO ELLIPTA) 100-25 MCG/ACT AEPB Inhale 1 puff into the lungs daily. 04/30/22   Oretha Milch, MD  folic acid-vitamin b complex-vitamin c-selenium-zinc (DIALYVITE) 3 MG TABS tablet Take 1 tablet by mouth daily.    [provider]  lactulose (CHRONULAC) 10 GM/15ML solution Take 30 mLs by mouth daily  as needed for mild constipation, moderate constipation or severe constipation. 09/04/21   [provider]  midodrine (PROAMATINE) 10 MG tablet Take 10 mg by mouth 3 (three) times daily as needed (Hypotension). 09/03/22   [provider]  NARCAN 4 MG/0.1ML LIQD nasal spray kit Place 1 spray into the nose as needed (accidental overdose). 11/14/18   [provider]  nystatin cream (MYCOSTATIN) Apply 1 Application topically daily.    [provider]  ondansetron (ZOFRAN-ODT) 8 MG disintegrating tablet Take 16 mg by mouth daily as needed for nausea or refractory nausea / vomiting.    [provider]  OVER THE COUNTER MEDICATION Place 1 drop into both eyes daily as needed (dry eyes). Always eye drops    [provider]  Oxycodone HCl 10 MG TABS Take 1 tablet (10 mg total) by mouth 3 (three) times daily as needed (severe pain). Patient taking differently: Take 15 mg by mouth 3 (three) times daily as needed (For pain). 12/22/21   Arrien, [place] Ram, MD  pantoprazole (PROTONIX) 40 MG tablet Take 1 tablet (40 mg total) by mouth daily. Patient taking differently: Take 20 mg by mouth daily. 09/28/22 04/02/23  Alver Sorrow, NP  sevelamer carbonate (RENVELA) 800 MG tablet Take 1,600 mg by mouth 3 (three) times daily with meals. 05/18/19   [provider]    Physical Exam: Vitals:   05/08/23 1930 05/08/23 2000 05/08/23 2030 05/08/23 2042  BP:      Pulse: 65 69 63   Resp: 16 20 15    Temp:    99.8 F (37.7 C)  TempSrc:    Oral  SpO2: 100% 99% 100%   Weight:      Height:        Constitutional: NAD, no pallor or diaphoresis   Eyes: PERTLA, lids and conjunctivae normal ENMT: Mucous membranes are moist. Posterior pharynx clear of any exudate or lesions.   Neck: supple, no masses  Respiratory: Speaking full sentences. Frequent cough. Prolonged expiratory phase, end-expiratory wheezes.  Cardiovascular: S1 & S2 heard, regular rate and rhythm.  No extremity edema.  Abdomen: no tenderness, soft. Bowel sounds active.  Musculoskeletal: no clubbing / cyanosis. No joint deformity upper and lower extremities.   Skin: no significant rashes, lesions, ulcers. Warm, dry, well-perfused. Neurologic: CN 2-12 grossly intact. Moving all extremities. Alert and oriented.  Psychiatric: Calm. Cooperative.    Labs and Imaging on Admission: I have personally reviewed following  labs and imaging studies  CBC: Recent Labs  Lab 05/08/23 1403  WBC 4.6  HGB 9.3*  HCT 30.4*  MCV 81.1  PLT 146*   Basic Metabolic Panel: Recent Labs  Lab 05/08/23 1403  NA 138  K 4.6  CL 96*  CO2 24  GLUCOSE 84  BUN 74*  CREATININE 16.06*  CALCIUM 9.6   GFR: Estimated Creatinine Clearance: 4.3 mL/min (A) (by C-G formula based on SCr of 16.06 mg/dL (H)). Liver Function Tests: Recent Labs  Lab 05/08/23 1403  AST 21  ALT 24  ALKPHOS 78  BILITOT 1.0  PROT 7.7  ALBUMIN 3.1*   Recent Labs  Lab 05/08/23 1403  LIPASE 65*   No results for input(s): "AMMONIA" in the last 168 hours. Coagulation Profile: No results for input(s): "INR", "PROTIME" in the last 168 hours. Cardiac Enzymes: No results for input(s): "CKTOTAL", "CKMB", "CKMBINDEX", "TROPONINI" in the last 168 hours. BNP (last 3 results) No results for input(s): "PROBNP" in the last 8760 hours. HbA1C: No results for input(s): "HGBA1C" in the last 72 hours. CBG: No results for input(s): "GLUCAP" in the last 168 hours. Lipid Profile: No results for input(s): "CHOL", "HDL", "LDLCALC", "TRIG", "CHOLHDL", "LDLDIRECT" in the last 72 hours. Thyroid Function Tests: No results for input(s): "TSH", "T4TOTAL", "FREET4", "T3FREE", "THYROIDAB" in the last 72 hours. Anemia Panel: No results for input(s): "VITAMINB12", "FOLATE", "FERRITIN", "TIBC", "IRON", "RETICCTPCT" in the last 72 hours. Urine analysis:    Component Value Date/Time   COLORURINE YELLOW 12/20/2013 0530   APPEARANCEUR CLOUDY (A)  12/20/2013 0530   LABSPEC 1.009 12/20/2013 0530   PHURINE 8.0 12/20/2013 0530   GLUCOSEU 100 (A) 12/20/2013 0530   HGBUR LARGE (A) 12/20/2013 0530   BILIRUBINUR NEGATIVE 12/20/2013 0530   KETONESUR NEGATIVE 12/20/2013 0530   PROTEINUR 100 (A) 12/20/2013 0530   UROBILINOGEN 0.2 12/20/2013 0530   NITRITE NEGATIVE 12/20/2013 0530   LEUKOCYTESUR TRACE (A) 12/20/2013 0530   Sepsis Labs: @LABRCNTIP (procalcitonin:4,lacticidven:4) ) Recent Results (from the past 240 hours)  Resp panel by RT-PCR (RSV, Flu A&B, Covid) Anterior Nasal Swab     Status: Abnormal   Collection Time: 05/08/23  2:44 PM   Specimen: Anterior Nasal Swab  Result Value Ref Range Status   SARS Coronavirus 2 by RT PCR NEGATIVE NEGATIVE Final   Influenza A by PCR POSITIVE (A) NEGATIVE Final   Influenza B by PCR NEGATIVE NEGATIVE Final    Comment: (NOTE) The Xpert Xpress SARS-CoV-2/FLU/RSV plus assay is intended as an aid in the diagnosis of influenza from Nasopharyngeal swab specimens and should not be used as a sole basis for treatment. Nasal washings and aspirates are unacceptable for Xpert Xpress SARS-CoV-2/FLU/RSV testing.  Fact Sheet for Patients: BloggerCourse.com  Fact Sheet for Healthcare Providers: SeriousBroker.it  This test is not yet approved or cleared by the [place] FDA and has been authorized for detection and/or diagnosis of SARS-CoV-2 by FDA under an Emergency Use Authorization (EUA). This EUA will remain in effect (meaning this test can be used) for the duration of the COVID-19 declaration under Section 564(b)(1) of the Act, 21 U.S.C. section 360bbb-3(b)(1), unless the authorization is terminated or revoked.     Resp Syncytial Virus by PCR NEGATIVE NEGATIVE Final    Comment: (NOTE) Fact Sheet for Patients: BloggerCourse.com  Fact Sheet for Healthcare Providers: SeriousBroker.it  This  test is not yet approved or cleared by the [place] FDA and has been authorized for detection and/or diagnosis of SARS-CoV-2 by FDA under an Emergency  Use Authorization (EUA). This EUA will remain in effect (meaning this test can be used) for the duration of the COVID-19 declaration under Section 564(b)(1) of the Act, 21 U.S.C. section 360bbb-3(b)(1), unless the authorization is terminated or revoked.  Performed at [facility], [address], [place], [place] [address]      Radiological Exams on Admission: DG Chest Portable 1 View Result Date: 05/08/2023 CLINICAL DATA:  Short of breath EXAM: PORTABLE CHEST 1 VIEW COMPARISON:  11/03/2022 FINDINGS: Single frontal view of the chest demonstrates stable enlargement of the cardiac silhouette. There is increased pulmonary vascular congestion, without overt edema. No effusion or pneumothorax. No acute bony abnormalities. IMPRESSION: 1. Pulmonary vascular congestion without overt edema. 2. Stable enlarged cardiac silhouette. Electronically Signed   By: Sharlet Salina M.D.   On: 05/08/2023 16:49    EKG: Independently reviewed. Sinus rhythm, QTc 470 ms.   Assessment/Plan   1. Influenza A; acute asthma exacerbation; chronic hypoxic respiratory failure  - Given DuoNeb and systemic steroid in ED  - Start Tamiflu, continue systemic steroid, albuterol as-needed, supplemental O2, droplet precautions, supportive care    2. N/V/D  - Abdominal exam benign, likely from influenza  - Continue supportive care, monitor volume status and electrolytes  3. ESRD  - Appreciate nephrology consulting  - SLIV, renally-dose medications  4. Atrial fibrillation  - Amiodarone, Eliquis    5. Chronic HFmrEF  - Appears compensated  - Volume managed with dialysis    6. Anemia  - Appears stable    DVT prophylaxis: Eliquis  Code Status: Full  Level of Care: Level of care: Progressive Family Communication: None present  Disposition Plan:  Patient  is from: home  Anticipated d/c is to: Home  Anticipated d/c date is: 05/11/23 Patient currently: Pending improved respiratory status, will need inpatient dialysis  Consults called: Nephrology  Admission status: Inpatient     Briscoe Deutscher, MD [facility]  05/08/2023, 8:46 PM

## 2023-05-09 DIAGNOSIS — J111 Influenza due to unidentified influenza virus with other respiratory manifestations: Secondary | ICD-10-CM | POA: Diagnosis present

## 2023-05-09 DIAGNOSIS — J101 Influenza due to other identified influenza virus with other respiratory manifestations: Secondary | ICD-10-CM | POA: Diagnosis not present

## 2023-05-09 LAB — BASIC METABOLIC PANEL
Anion gap: 22 — ABNORMAL HIGH (ref 5–15)
BUN: 83 mg/dL — ABNORMAL HIGH (ref 6–20)
CO2: 22 mmol/L (ref 22–32)
Calcium: 9.4 mg/dL (ref 8.9–10.3)
Chloride: 92 mmol/L — ABNORMAL LOW (ref 98–111)
Creatinine, Ser: 16.89 mg/dL — ABNORMAL HIGH (ref 0.44–1.00)
GFR, Estimated: 2 mL/min — ABNORMAL LOW (ref >60)
Glucose, Bld: 168 mg/dL — ABNORMAL HIGH (ref 70–99)
Potassium: 5.1 mmol/L (ref 3.5–5.1)
Sodium: 136 mmol/L (ref 135–145)

## 2023-05-09 LAB — CBC
HCT: 30.6 % — ABNORMAL LOW (ref 36.0–46.0)
Hemoglobin: 9.3 g/dL — ABNORMAL LOW (ref 12.0–15.0)
MCH: 24.8 pg — ABNORMAL LOW (ref 26.0–34.0)
MCHC: 30.4 g/dL (ref 30.0–36.0)
MCV: 81.6 fL (ref 80.0–100.0)
Platelets: 128 10*3/uL — ABNORMAL LOW (ref 150–400)
RBC: 3.75 MIL/uL — ABNORMAL LOW (ref 3.87–5.11)
RDW: 20.1 % — ABNORMAL HIGH (ref 11.5–15.5)
WBC: 3.8 10*3/uL — ABNORMAL LOW (ref 4.0–10.5)
nRBC: 0 % (ref 0.0–0.2)

## 2023-05-09 LAB — HEPATITIS B SURFACE ANTIGEN: Hepatitis B Surface Ag: NONREACTIVE

## 2023-05-09 LAB — PHOSPHORUS: Phosphorus: 5.5 mg/dL — ABNORMAL HIGH (ref 2.5–4.6)

## 2023-05-09 MED ORDER — METHYLPREDNISOLONE SODIUM SUCC 40 MG IJ SOLR
40.0000 mg | Freq: Every day | INTRAMUSCULAR | Status: DC
  Administered 2023-05-09 – 2023-05-10 (×2): 40 mg via INTRAVENOUS
  Filled 2023-05-09 (×2): qty 1

## 2023-05-09 MED ORDER — OSELTAMIVIR PHOSPHATE 30 MG PO CAPS
30.0000 mg | ORAL_CAPSULE | Freq: Once | ORAL | Status: AC
  Administered 2023-05-09: 30 mg via ORAL
  Filled 2023-05-09: qty 1

## 2023-05-09 MED ORDER — IPRATROPIUM-ALBUTEROL 0.5-2.5 (3) MG/3ML IN SOLN
3.0000 mL | Freq: Four times a day (QID) | RESPIRATORY_TRACT | Status: DC
  Administered 2023-05-09 – 2023-05-10 (×5): 3 mL via RESPIRATORY_TRACT
  Filled 2023-05-09 (×6): qty 3

## 2023-05-09 MED ORDER — ALBUTEROL SULFATE (2.5 MG/3ML) 0.083% IN NEBU: 2.5000 mg | INHALATION_SOLUTION | RESPIRATORY_TRACT | Status: DC | PRN

## 2023-05-09 MED ORDER — FERRIC CITRATE 1 GM 210 MG(FE) PO TABS
630.0000 mg | ORAL_TABLET | Freq: Three times a day (TID) | ORAL | Status: DC
  Administered 2023-05-09 – 2023-05-10 (×3): 630 mg via ORAL
  Filled 2023-05-09 (×3): qty 3

## 2023-05-09 MED ORDER — SEVELAMER CARBONATE 800 MG PO TABS
2400.0000 mg | ORAL_TABLET | Freq: Three times a day (TID) | ORAL | Status: DC
  Administered 2023-05-09 – 2023-05-10 (×3): 2400 mg via ORAL
  Filled 2023-05-09 (×3): qty 3

## 2023-05-09 MED ORDER — MOMETASONE FURO-FORMOTEROL FUM 200-5 MCG/ACT IN AERO
2.0000 | INHALATION_SPRAY | Freq: Two times a day (BID) | RESPIRATORY_TRACT | Status: DC
  Administered 2023-05-09 – 2023-05-10 (×2): 2 via RESPIRATORY_TRACT
  Filled 2023-05-09 (×3): qty 8.8

## 2023-05-09 MED ORDER — NEPRO/CARBSTEADY PO LIQD
237.0000 mL | Freq: Two times a day (BID) | ORAL | Status: DC
  Administered 2023-05-10 (×2): 237 mL via ORAL

## 2023-05-09 MED ORDER — MIDODRINE HCL 5 MG PO TABS
10.0000 mg | ORAL_TABLET | Freq: Once | ORAL | Status: AC
  Administered 2023-05-09: 10 mg via ORAL

## 2023-05-09 NOTE — Progress Notes (Signed)
 Pt goal met. Midodrine 10 mg po given during HD.  05/09/23 1400  Vitals  Temp 98 F (36.7 C)  Temp Source Oral  BP 109/62  BP Location Right Arm  BP Method Automatic  Patient Position (if appropriate) Lying  Oxygen Therapy  O2 Device Nasal Cannula  O2 Flow Rate (L/min) 2 L/min  During Treatment Monitoring  HD Safety Checks Performed Yes  Intra-Hemodialysis Comments Progressing as prescribed  Post Treatment  Dialyzer Clearance Lightly streaked  Hemodialysis Intake (mL) 0 mL  Liters Processed 72  Fluid Removed (mL) 2000 mL  Tolerated HD Treatment Yes  Post-Hemodialysis Comments Pt goal met  AVG/AVF Arterial Site Held (minutes) 10 minutes  AVG/AVF Venous Site Held (minutes) 10 minutes

## 2023-05-09 NOTE — Progress Notes (Addendum)
 [facility] Progress Note   Subjective:    Seen and examined patient on HD. Reviewed CXR and UFG raised to 2L. On Midodrine and ordered an extra dose mid-run.   Objective Vitals:   05/09/23 1400 05/09/23 1415 05/09/23 1500 05/09/23 1528  BP: 109/62   123/72  Pulse:    (!) 59  Resp:      Temp: 98 F (36.7 C)   98.3 F (36.8 C)  TempSrc: Oral   Oral  SpO2:    100%  Weight:  66.9 kg 67 kg   Height:   5\' 4"  (1.626 m)    Physical Exam General: Awake, alert, NAD Heart: S1 and S2; No MRGs Lungs: Bilateral coarse, wheezing, and rhonchi Abdomen: Soft and non-tender Extremities: No LE edema Dialysis Access: L AVF (+) B/T   Filed Weights   05/09/23 1057 05/09/23 1415 05/09/23 1500  Weight: 68.9 kg 66.9 kg 67 kg    Intake/Output Summary (Last 24 hours) at 05/09/2023 1548 Last data filed at 05/09/2023 1400 Gross per 24 hour  Intake --  Output 2000 ml  Net -2000 ml    Additional Objective Labs: Basic Metabolic Panel: Recent Labs  Lab 05/08/23 1403 05/08/23 2348 05/09/23 0413  NA 138  --  136  K 4.6  --  5.1  CL 96*  --  92*  CO2 24  --  22  GLUCOSE 84  --  168*  BUN 74*  --  83*  CREATININE 16.06*  --  16.89*  CALCIUM 9.6  --  9.4  PHOS  --  5.5*  --    Liver Function Tests: Recent Labs  Lab 05/08/23 1403  AST 21  ALT 24  ALKPHOS 78  BILITOT 1.0  PROT 7.7  ALBUMIN 3.1*   Recent Labs  Lab 05/08/23 1403  LIPASE 65*   CBC: Recent Labs  Lab 05/08/23 1403 05/09/23 0413  WBC 4.6 3.8*  HGB 9.3* 9.3*  HCT 30.4* 30.6*  MCV 81.1 81.6  PLT 146* 128*   Blood Culture    Component Value Date/Time   SDES BLOOD BLOOD RIGHT HAND 10/01/2022 2146   SDES BLOOD BLOOD RIGHT HAND 10/01/2022 2146   SPECREQUEST  10/01/2022 2146    BOTTLES DRAWN AEROBIC AND ANAEROBIC Blood Culture adequate volume   SPECREQUEST  10/01/2022 2146    BOTTLES DRAWN AEROBIC AND ANAEROBIC Blood Culture adequate volume   CULT  10/01/2022 2146    NO GROWTH 5  DAYS Performed at [facility], [address] [place], [place] [address]    CULT  10/01/2022 2146    NO GROWTH 5 DAYS Performed at [facility], [address] [place], [place] [address]    REPTSTATUS 10/06/2022 FINAL 10/01/2022 2146   REPTSTATUS 10/06/2022 FINAL 10/01/2022 2146    Cardiac Enzymes: No results for input(s): "CKTOTAL", "CKMB", "CKMBINDEX", "TROPONINI" in the last 168 hours. CBG: No results for input(s): "GLUCAP" in the last 168 hours. Iron Studies: No results for input(s): "IRON", "TIBC", "TRANSFERRIN", "FERRITIN" in the last 72 hours. Lab Results  Component Value Date   INR 1.2 09/12/2022   INR 1.2 01/10/2021   INR 1.21 04/12/2018   Studies/Results: DG Chest Portable 1 View Result Date: 05/08/2023 CLINICAL DATA:  Short of breath EXAM: PORTABLE CHEST 1 VIEW COMPARISON:  11/03/2022 FINDINGS: Single frontal view of the chest demonstrates stable enlargement of the cardiac silhouette. There is increased pulmonary vascular congestion, without overt edema. No effusion or pneumothorax. No acute bony  abnormalities. IMPRESSION: 1. Pulmonary vascular congestion without overt edema. 2. Stable enlarged cardiac silhouette. Electronically Signed   By: Sharlet Salina M.D.   On: 05/08/2023 16:49    Medications:   amiodarone  200 mg Oral Daily   apixaban  5 mg Oral BID   atorvastatin  80 mg Oral Daily   Chlorhexidine Gluconate Cloth  6 each Topical Q0600   clopidogrel  75 mg Oral Q breakfast   [START ON 05/10/2023] feeding supplement (NEPRO CARB STEADY)  237 mL Oral BID BM   ipratropium-albuterol  3 mL Nebulization Q6H   methylPREDNISolone (SOLU-MEDROL) injection  40 mg Intravenous Daily   mometasone-formoterol  2 puff Inhalation BID   [START ON 05/11/2023] oseltamivir  30 mg Oral Q T,Th,Sa-HD   pantoprazole  40 mg Oral Daily   sodium chloride flush  3 mL Intravenous Q12H    Dialysis Orders: SW TTS 3h  B400  70.5kg  2K bath  LUA AVF  Heparin 6600 -Micera  every 2 weeks - last dose 2/18  Renal-related home meds: - midodrine 10 tid prn - Renvela 3 tabs with meals AND Auryxia 3 tabs with meals   Assessment/Plan: Influenza A/ acute asthma exacerbation/ chronic hypoxic resp failure - starting tamiflu, cont systemic steroids, albuterol nebs prn, supp O2, supportive care per pmd. Is on 3L O2 at home.  N/V/D - symptomatic Rx per pmd ESRD - on HD TTS. Missed Thursday and today's HD. On HD-UFG set 2L.Marland Kitchen HTN - bp's 120/80, no bp lowering meds.  Volume - doesn't look vol overloaded. May be a bit dry, but would hold off on IVF"s until daylight hours tomorrow.  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 Last ESA given in outpatient was 2/18. Next dose due 2/25 if she is still here.  Secondary hyperparathyroidism - CCa and phos in range. Resumed binders. She is on [place]. Atrial fibrillation - per pmd  Salome Holmes, NP [facility] 05/09/2023,3:48 PM  LOS: 1 day

## 2023-05-09 NOTE — Progress Notes (Signed)
 PROGRESS NOTE    Desiree [name]  RUE:[id number] DOB: 12-12-1975 DOA: 05/08/2023 PCP: Jearld Lesch, MD   Brief Narrative:  48 year old African-American female with past medical history of end-stage renal disease on hemodialysis, CAD status post anterior STEMI with thrombus with PTCA to 2 vessels, paroxysmal A-fib on anticoagulation, anemia of chronic kidney disease, chronic respiratory failure on oxygen at 3 L/minute, chronic systolic heart failure with EF of 45%, chronic pericardial effusion presented with cough, nausea, vomiting and diarrhea after missing 2 last dialysis sessions.  On presentation, she tested positive for influenza A.  Chest x-ray showed vascular congestion.  Patient was treated with Decadron, nebs, Zofran.  Nephrology was consulted.  Assessment & Plan:   Influenza A acute bronchitis Acute asthma exacerbation Chronic hypoxic respiratory failure -Respiratory status currently stable: On 3 L oxygen via nasal cannula which patient normally wears at home. -Continue Tamiflu along with steroids and nebs.  Nausea, vomiting, diarrhea -Abdominal exam is benign.  Possibly from influenza.  Continue supportive care  End-stage renal disease on hemodialysis -Nephrology following.  Dialysis as per nephrology schedule  Paroxysmal A-fib -Mild intermittent bradycardia present.  Continue amiodarone and Eliquis  Chronic history of heart failure -Compensated.  Volume managed by dialysis.  Strict input output.  Daily weights.  Fluid restriction  Anemia of chronic disease -From renal failure.  Hemoglobin stable  Thrombocytopenia -questionable cause.  Monitor intermittently.  No signs of bleeding  Hyperphosphatemia - managed by nephrology by hemodialysis.   DVT prophylaxis: Eliquis Code Status: Full Family Communication: None at bedside Disposition Plan: Status is: Inpatient Remains inpatient appropriate because: Of severity of illness  Consultants:  Nephrology  Procedures: None  Antimicrobials: None   Subjective: Patient seen and examined at bedside.  Still short of breath with exertion.  Denies any chest pain, fever, worsening abdominal pain Objective: Vitals:   05/09/23 0100 05/09/23 0200 05/09/23 0300 05/09/23 0600  BP: 118/73 114/70 119/62 124/67  Pulse: (!) 58 (!) 58 (!) 59 (!) 54  Resp: (!) 24 19 (!) 28 (!) 25  Temp:   98.7 F (37.1 C)   TempSrc:   Oral   SpO2: 99% 100% 100% 100%  Weight:      Height:       No intake or output data in the 24 hours ending 05/09/23 0713 Filed Weights   05/08/23 1339  Weight: 70 kg    Examination:  General exam: Appears calm and comfortable.  On 3 L oxygen via nasal cannula. Respiratory system: Bilateral decreased breath sounds at bases with scattered crackles and wheezing Cardiovascular system: S1 & S2 heard, Rate controlled Gastrointestinal system: Abdomen is nondistended, soft and nontender. Normal bowel sounds heard. Extremities: No cyanosis, clubbing; trace lower extremity edema Central nervous system: Alert and oriented. No focal neurological deficits. Moving extremities Skin: No rashes, lesions or ulcers Psychiatry: Mostly flat affect.  Not agitated.   Data Reviewed: I have personally reviewed following labs and imaging studies  CBC: Recent Labs  Lab 05/08/23 1403 05/09/23 0413  WBC 4.6 3.8*  HGB 9.3* 9.3*  HCT 30.4* 30.6*  MCV 81.1 81.6  PLT 146* 128*   Basic Metabolic Panel: Recent Labs  Lab 05/08/23 1403 05/08/23 2348 05/09/23 0413  NA 138  --  136  K 4.6  --  5.1  CL 96*  --  92*  CO2 24  --  22  GLUCOSE 84  --  168*  BUN 74*  --  83*  CREATININE 16.06*  --  16.89*  CALCIUM 9.6  --  9.4  PHOS  --  5.5*  --    GFR: Estimated Creatinine Clearance: 4 mL/min (A) (by C-G formula based on SCr of 16.89 mg/dL (H)). Liver Function Tests: Recent Labs  Lab 05/08/23 1403  AST 21  ALT 24  ALKPHOS 78  BILITOT 1.0  PROT 7.7  ALBUMIN 3.1*   Recent  Labs  Lab 05/08/23 1403  LIPASE 65*   No results for input(s): "AMMONIA" in the last 168 hours. Coagulation Profile: No results for input(s): "INR", "PROTIME" in the last 168 hours. Cardiac Enzymes: No results for input(s): "CKTOTAL", "CKMB", "CKMBINDEX", "TROPONINI" in the last 168 hours. BNP (last 3 results) No results for input(s): "PROBNP" in the last 8760 hours. HbA1C: No results for input(s): "HGBA1C" in the last 72 hours. CBG: No results for input(s): "GLUCAP" in the last 168 hours. Lipid Profile: No results for input(s): "CHOL", "HDL", "LDLCALC", "TRIG", "CHOLHDL", "LDLDIRECT" in the last 72 hours. Thyroid Function Tests: No results for input(s): "TSH", "T4TOTAL", "FREET4", "T3FREE", "THYROIDAB" in the last 72 hours. Anemia Panel: No results for input(s): "VITAMINB12", "FOLATE", "FERRITIN", "TIBC", "IRON", "RETICCTPCT" in the last 72 hours. Sepsis Labs: No results for input(s): "PROCALCITON", "LATICACIDVEN" in the last 168 hours.  Recent Results (from the past 240 hours)  Resp panel by RT-PCR (RSV, Flu A&B, Covid) Anterior Nasal Swab     Status: Abnormal   Collection Time: 05/08/23  2:44 PM   Specimen: Anterior Nasal Swab  Result Value Ref Range Status   SARS Coronavirus 2 by RT PCR NEGATIVE NEGATIVE Final   Influenza A by PCR POSITIVE (A) NEGATIVE Final   Influenza B by PCR NEGATIVE NEGATIVE Final    Comment: (NOTE) The Xpert Xpress SARS-CoV-2/FLU/RSV plus assay is intended as an aid in the diagnosis of influenza from Nasopharyngeal swab specimens and should not be used as a sole basis for treatment. Nasal washings and aspirates are unacceptable for Xpert Xpress SARS-CoV-2/FLU/RSV testing.  Fact Sheet for Patients: BloggerCourse.com  Fact Sheet for Healthcare Providers: SeriousBroker.it  This test is not yet approved or cleared by the [place] FDA and has been authorized for detection and/or diagnosis of  SARS-CoV-2 by FDA under an Emergency Use Authorization (EUA). This EUA will remain in effect (meaning this test can be used) for the duration of the COVID-19 declaration under Section 564(b)(1) of the Act, 21 U.S.C. section 360bbb-3(b)(1), unless the authorization is terminated or revoked.     Resp Syncytial Virus by PCR NEGATIVE NEGATIVE Final    Comment: (NOTE) Fact Sheet for Patients: BloggerCourse.com  Fact Sheet for Healthcare Providers: SeriousBroker.it  This test is not yet approved or cleared by the [place] FDA and has been authorized for detection and/or diagnosis of SARS-CoV-2 by FDA under an Emergency Use Authorization (EUA). This EUA will remain in effect (meaning this test can be used) for the duration of the COVID-19 declaration under Section 564(b)(1) of the Act, 21 U.S.C. section 360bbb-3(b)(1), unless the authorization is terminated or revoked.  Performed at [facility], [address], [place], [place] [address]          Radiology Studies: DG Chest Portable 1 View Result Date: 05/08/2023 CLINICAL DATA:  Short of breath EXAM: PORTABLE CHEST 1 VIEW COMPARISON:  11/03/2022 FINDINGS: Single frontal view of the chest demonstrates stable enlargement of the cardiac silhouette. There is increased pulmonary vascular congestion, without overt edema. No effusion or pneumothorax. No acute bony abnormalities. IMPRESSION: 1. Pulmonary vascular congestion without overt  edema. 2. Stable enlarged cardiac silhouette. Electronically Signed   By: Sharlet Salina M.D.   On: 05/08/2023 16:49        Scheduled Meds:  amiodarone  200 mg Oral Daily   apixaban  5 mg Oral BID   atorvastatin  80 mg Oral Daily   Chlorhexidine Gluconate Cloth  6 each Topical Q0600   clopidogrel  75 mg Oral Q breakfast   methylPREDNISolone  32 mg Oral Daily   [START ON 05/11/2023] oseltamivir  30 mg Oral Q T,Th,Sa-HD   pantoprazole  40 mg  Oral Daily   sodium chloride flush  3 mL Intravenous Q12H   Continuous Infusions:        Glade Lloyd, MD [facility] 05/09/2023, 7:13 AM

## 2023-05-09 NOTE — Plan of Care (Signed)

## 2023-05-10 DIAGNOSIS — J101 Influenza due to other identified influenza virus with other respiratory manifestations: Secondary | ICD-10-CM | POA: Diagnosis not present

## 2023-05-10 LAB — CBC
HCT: 32.1 % — ABNORMAL LOW (ref 36.0–46.0)
Hemoglobin: 10 g/dL — ABNORMAL LOW (ref 12.0–15.0)
MCH: 24.9 pg — ABNORMAL LOW (ref 26.0–34.0)
MCHC: 31.2 g/dL (ref 30.0–36.0)
MCV: 79.9 fL — ABNORMAL LOW (ref 80.0–100.0)
Platelets: 173 10*3/uL (ref 150–400)
RBC: 4.02 MIL/uL (ref 3.87–5.11)
RDW: 19.9 % — ABNORMAL HIGH (ref 11.5–15.5)
WBC: 7 10*3/uL (ref 4.0–10.5)
nRBC: 0 % (ref 0.0–0.2)

## 2023-05-10 LAB — BASIC METABOLIC PANEL
Anion gap: 14 (ref 5–15)
BUN: 51 mg/dL — ABNORMAL HIGH (ref 6–20)
CO2: 28 mmol/L (ref 22–32)
Calcium: 9.1 mg/dL (ref 8.9–10.3)
Chloride: 94 mmol/L — ABNORMAL LOW (ref 98–111)
Creatinine, Ser: 10.33 mg/dL — ABNORMAL HIGH (ref 0.44–1.00)
GFR, Estimated: 4 mL/min — ABNORMAL LOW (ref >60)
Glucose, Bld: 136 mg/dL — ABNORMAL HIGH (ref 70–99)
Potassium: 3.9 mmol/L (ref 3.5–5.1)
Sodium: 136 mmol/L (ref 135–145)

## 2023-05-10 LAB — PHOSPHORUS: Phosphorus: 3.9 mg/dL (ref 2.5–4.6)

## 2023-05-10 LAB — MAGNESIUM: Magnesium: 2 mg/dL (ref 1.7–2.4)

## 2023-05-10 MED ORDER — OXYCODONE-ACETAMINOPHEN 5-325 MG PO TABS: 1.0000 | ORAL_TABLET | Freq: Four times a day (QID) | ORAL | Status: DC | PRN

## 2023-05-10 MED ORDER — FERRIC CITRATE 1 GM 210 MG(FE) PO TABS: 420.0000 mg | ORAL_TABLET | Freq: Three times a day (TID) | ORAL | Status: DC

## 2023-05-10 MED ORDER — GUAIFENESIN-DM 100-10 MG/5ML PO SYRP: 10.0000 mL | ORAL_SOLUTION | ORAL | 0 refills | Status: DC | PRN

## 2023-05-10 MED ORDER — OXYCODONE HCL 5 MG PO TABS: 5.0000 mg | ORAL_TABLET | Freq: Four times a day (QID) | ORAL | Status: DC | PRN

## 2023-05-10 MED ORDER — METHYLPREDNISOLONE 4 MG PO TBPK: ORAL_TABLET | ORAL | 0 refills | Status: DC

## 2023-05-10 MED ORDER — OXYCODONE-ACETAMINOPHEN 10-325 MG PO TABS: 1.0000 | ORAL_TABLET | Freq: Four times a day (QID) | ORAL | Status: DC | PRN

## 2023-05-10 MED ORDER — OSELTAMIVIR PHOSPHATE 30 MG PO CAPS: 30.0000 mg | ORAL_CAPSULE | Freq: Every day | ORAL | 0 refills | Status: AC

## 2023-05-10 NOTE — Progress Notes (Signed)
 DISCHARGE NOTE HOME Desiree [name] to be discharged Home per MD order. Discussed prescriptions and follow up appointments with the patient. Prescriptions given to patient; medication list explained in detail. Patient verbalized understanding.  Skin clean, dry and intact without evidence of skin break down, no evidence of skin tears noted. IV catheter discontinued intact. Site without signs and symptoms of complications. Dressing and pressure applied. Pt denies pain at the site currently. No complaints noted.  Patient free of lines, drains, and wounds.   An After Visit Summary (AVS) was printed and given to the patient. Patient escorted via wheelchair, and discharged home via private auto.  Margarita Grizzle, RN

## 2023-05-10 NOTE — Discharge Summary (Addendum)
 Physician Discharge Summary  Desiree [name] TKP:[id number] DOB: 1975-11-02 DOA: 05/08/2023  PCP: Jearld Lesch, MD  Admit date: 05/08/2023 Discharge date: 05/10/2023  Admitted From: Home Disposition: Home  Recommendations for Outpatient Follow-up:  Follow up with PCP in 1 week with repeat CBC/BMP Outpatient follow-up with dialysis unit as scheduled. Follow up in ED if symptoms worsen or new appear   Home Health: No Equipment/Devices: None  Discharge Condition: Stable CODE STATUS: Full Diet recommendation: Heart healthy  Brief/Interim Summary: 48 year old African-American female with past medical history of end-stage renal disease on hemodialysis, CAD status post anterior STEMI with thrombus with PTCA to 2 vessels, paroxysmal A-fib on anticoagulation, anemia of chronic kidney disease, chronic respiratory failure on oxygen at 3 L/minute, chronic systolic heart failure with EF of 45%, chronic pericardial effusion presented with cough, nausea, vomiting and diarrhea after missing 2 last dialysis sessions. On presentation, she tested positive for influenza A. Chest x-ray showed vascular congestion. Patient was treated with Decadron, nebs, Zofran. Nephrology was consulted.  During the hospitalization, her condition is improved.  She feels much better and wants to go home today.  She has tolerated inpatient dialysis.  She will be discharged home today with outpatient follow-up with dialysis unit as scheduled.  Discharge Diagnoses:   Influenza A acute bronchitis Acute asthma exacerbation Chronic hypoxic respiratory failure -Respiratory status currently stable: On 3 L oxygen via nasal cannula which patient normally wears at home. -Currently on Tamiflu along with steroids and nebs. -She feels much better and wants to go home today.  Discharge home to complete course of Tamiflu and also on Medrol Dosepak.   Nausea, vomiting, diarrhea -Abdominal exam is benign.  Possibly from influenza.    -Symptoms are improving.  Tolerating diet.   End-stage renal disease on hemodialysis -Nephrology following.  Tolerated inpatient hemodialysis.  Nephrology has cleared the patient for discharge.  Outpatient follow-up with hemodialysis unit as scheduled.   Paroxysmal A-fib -Rate mostly controlled.  Outpatient follow-up with cardiology.  Continue amiodarone and Eliquis   Chronic history of heart failure -Compensated.  Volume managed by dialysis.  Continue diet and fluid restriction.  Anemia of chronic disease -From renal failure.  Hemoglobin stable.  Outpatient intermittent follow-up.   Thrombocytopenia -questionable cause.  Outpatient intermittent follow-up.  No signs of bleeding   Hyperphosphatemia - managed by nephrology by hemodialysis.  Improved.   Discharge Instructions  Discharge Instructions     Diet - low sodium heart healthy   Complete by: As directed    Increase activity slowly   Complete by: As directed       Allergies as of 05/10/2023       Reactions   Iron Sucrose Itching   Other Reaction(s): C/O of Being Hot, headache   Morphine Shortness Of Breath, Anaphylaxis   Prednisone Other (See Comments)   Other reaction(s): Other (See Comments) Muscle spasms Patient says prednisone causes her to cramp all over, muscle spasms uncontrolled   Tuna [fish Allergy] Itching, Swelling, Rash, Other (See Comments)   Face droops also   Amlodipine Other (See Comments)   Angioedema (09/05/17 ED visit)   Iodinated Contrast Media Itching   Tape Itching   Adhesive tape   paper tape ok        Medication List     TAKE these medications    acetaminophen 325 MG tablet Commonly known as: TYLENOL Take 325 mg by mouth every 6 (six) hours as needed for mild pain (pain score 1-3) or headache.  albuterol 108 (90 Base) MCG/ACT inhaler Commonly known as: VENTOLIN HFA Inhale 2 puffs into the lungs every 4 (four) hours as needed for wheezing or shortness of breath.    ALPRAZolam 1 MG tablet Commonly known as: XANAX Take 1 tablet (1 mg total) by mouth 3 (three) times daily as needed for anxiety. Take 30 minutes prior to MRI What changed: when to take this   amiodarone 200 MG tablet Commonly known as: PACERONE Take 1 tablet (200 mg total) by mouth daily.   apixaban 5 MG Tabs tablet Commonly known as: ELIQUIS Take 1 tablet (5 mg total) by mouth 2 (two) times daily.   atorvastatin 80 MG tablet Commonly known as: LIPITOR Take 1 tablet (80 mg total) by mouth daily.   carboxymethylcellulose 0.5 % Soln Commonly known as: REFRESH PLUS Place 2 drops into both eyes daily.   clopidogrel 75 MG tablet Commonly known as: PLAVIX Take 1 tablet (75 mg total) by mouth daily with breakfast.   cyclobenzaprine 10 MG tablet Commonly known as: FLEXERIL Take 10 mg by mouth 2 (two) times daily as needed for muscle spasms.   ferric citrate 1 GM 210 MG(Fe) tablet Commonly known as: AURYXIA Take 2 tablets (420 mg total) by mouth 3 (three) times daily with meals.   fluticasone furoate-vilanterol 100-25 MCG/ACT Aepb Commonly known as: Breo Ellipta Inhale 1 puff into the lungs daily.   folic acid-vitamin b complex-vitamin c-selenium-zinc 3 MG Tabs tablet Take 1 tablet by mouth daily.   guaiFENesin-dextromethorphan 100-10 MG/5ML syrup Commonly known as: ROBITUSSIN DM Take 10 mLs by mouth every 4 (four) hours as needed for cough.   lactulose 10 GM/15ML solution Commonly known as: CHRONULAC Take 30 mLs by mouth daily as needed for mild constipation, moderate constipation or severe constipation.   loperamide 1 MG/5ML solution Commonly known as: IMODIUM Take 2 mg by mouth 4 (four) times daily as needed for diarrhea or loose stools.   methylPREDNISolone 4 MG Tbpk tablet Commonly known as: MEDROL DOSEPAK Take as directed by medrol dose pack   midodrine 10 MG tablet Commonly known as: PROAMATINE Take 10 mg by mouth 3 (three) times daily as needed  (Hypotension).   Narcan 4 MG/0.1ML Liqd nasal spray kit Generic drug: naloxone Place 1 spray into the nose as needed (accidental overdose).   nystatin cream Commonly known as: MYCOSTATIN Apply 1 Application topically daily. Apply to recurrent boils on buttocks   ondansetron 8 MG disintegrating tablet Commonly known as: ZOFRAN-ODT Take 16 mg by mouth daily as needed for nausea or refractory nausea / vomiting.   oseltamivir 30 MG capsule Commonly known as: TAMIFLU Take 1 capsule (30 mg total) by mouth daily for 3 days.   oxyCODONE-acetaminophen 10-325 MG tablet Commonly known as: PERCOCET Take 1 tablet by mouth every 6 (six) hours as needed.   pantoprazole 40 MG tablet Commonly known as: Protonix Take 1 tablet (40 mg total) by mouth daily. What changed: how much to take   sevelamer carbonate 800 MG tablet Commonly known as: RENVELA Take 1,600 mg by mouth 3 (three) times daily with meals.          Follow-up Information     Jearld Lesch, MD. Schedule an appointment as soon as possible for a visit in 1 week(s).   Specialty: Family Medicine Contact information: [address] [place] [address] [phone]                Allergies  Allergen Reactions   Iron Sucrose  Itching    Other Reaction(s): C/O of Being Hot, headache   Morphine Shortness Of Breath and Anaphylaxis   Prednisone Other (See Comments)    Other reaction(s): Other (See Comments) Muscle spasms Patient says prednisone causes her to cramp all over, muscle spasms uncontrolled   Tuna [Fish Allergy] Itching, Swelling, Rash and Other (See Comments)    Face droops also   Amlodipine Other (See Comments)    Angioedema (09/05/17 ED visit)   Iodinated Contrast Media Itching   Tape Itching    Adhesive tape   paper tape ok    Consultations: Nephrology   Procedures/Studies: DG Chest Portable 1 View Result Date: 05/08/2023 CLINICAL DATA:  Short of breath EXAM: PORTABLE CHEST 1 VIEW  COMPARISON:  11/03/2022 FINDINGS: Single frontal view of the chest demonstrates stable enlargement of the cardiac silhouette. There is increased pulmonary vascular congestion, without overt edema. No effusion or pneumothorax. No acute bony abnormalities. IMPRESSION: 1. Pulmonary vascular congestion without overt edema. 2. Stable enlarged cardiac silhouette. Electronically Signed   By: Sharlet Salina M.D.   On: 05/08/2023 16:49      Subjective: Patient seen and examined at bedside.  Feels slightly better although she is still coughing and slightly wheezing.  No fever, vomiting, chest pain reported.    Discharge Exam: Vitals:   05/10/23 0559 05/10/23 0819  BP: 106/67 114/63  Pulse: 62 60  Resp: 18   Temp: 98.3 F (36.8 C) 98.3 F (36.8 C)  SpO2:  98%    General: On 2 L oxygen via nasal cannula.  No distress ENT/neck: No thyromegaly.  JVD is not elevated  respiratory: Decreased breath sounds at bases bilaterally with some wheezing CVS: S1-S2 heard, rate controlled currently Abdominal: Soft, nontender, slightly distended; no organomegaly, normal bowel sounds are heard Extremities: Trace lower extremity edema; no cyanosis  CNS: Awake and alert.  No focal neurologic deficit.  Moves extremities Lymph: No obvious lymphadenopathy Skin: No obvious ecchymosis/lesions  psych: Flat affect.  Currently not agitated.   Musculoskeletal: No obvious joint swelling/deformity      The results of significant diagnostics from this hospitalization (including imaging, microbiology, ancillary and laboratory) are listed below for reference.     Microbiology: Recent Results (from the past 240 hours)  Resp panel by RT-PCR (RSV, Flu A&B, Covid) Anterior Nasal Swab     Status: Abnormal   Collection Time: 05/08/23  2:44 PM   Specimen: Anterior Nasal Swab  Result Value Ref Range Status   SARS Coronavirus 2 by RT PCR NEGATIVE NEGATIVE Final   Influenza A by PCR POSITIVE (A) NEGATIVE Final   Influenza B by  PCR NEGATIVE NEGATIVE Final    Comment: (NOTE) The Xpert Xpress SARS-CoV-2/FLU/RSV plus assay is intended as an aid in the diagnosis of influenza from Nasopharyngeal swab specimens and should not be used as a sole basis for treatment. Nasal washings and aspirates are unacceptable for Xpert Xpress SARS-CoV-2/FLU/RSV testing.  Fact Sheet for Patients: BloggerCourse.com  Fact Sheet for Healthcare Providers: SeriousBroker.it  This test is not yet approved or cleared by the [place] FDA and has been authorized for detection and/or diagnosis of SARS-CoV-2 by FDA under an Emergency Use Authorization (EUA). This EUA will remain in effect (meaning this test can be used) for the duration of the COVID-19 declaration under Section 564(b)(1) of the Act, 21 U.S.C. section 360bbb-3(b)(1), unless the authorization is terminated or revoked.     Resp Syncytial Virus by PCR NEGATIVE NEGATIVE Final    Comment: (NOTE)  Fact Sheet for Patients: BloggerCourse.com  Fact Sheet for Healthcare Providers: SeriousBroker.it  This test is not yet approved or cleared by the [place] FDA and has been authorized for detection and/or diagnosis of SARS-CoV-2 by FDA under an Emergency Use Authorization (EUA). This EUA will remain in effect (meaning this test can be used) for the duration of the COVID-19 declaration under Section 564(b)(1) of the Act, 21 U.S.C. section 360bbb-3(b)(1), unless the authorization is terminated or revoked.  Performed at [facility], [address], [place], [place] [address]      Labs: BNP (last 3 results) Recent Labs    09/14/22 0243 05/08/23 1714  BNP 1,704.8* 531.8*   Basic Metabolic Panel: Recent Labs  Lab 05/08/23 1403 05/08/23 2348 05/09/23 0413 05/10/23 0440  NA 138  --  136 136  K 4.6  --  5.1 3.9  CL 96*  --  92* 94*  CO2 24  --  22 28   GLUCOSE 84  --  168* 136*  BUN 74*  --  83* 51*  CREATININE 16.06*  --  16.89* 10.33*  CALCIUM 9.6  --  9.4 9.1  MG  --   --   --  2.0  PHOS  --  5.5*  --  3.9   Liver Function Tests: Recent Labs  Lab 05/08/23 1403  AST 21  ALT 24  ALKPHOS 78  BILITOT 1.0  PROT 7.7  ALBUMIN 3.1*   Recent Labs  Lab 05/08/23 1403  LIPASE 65*   No results for input(s): "AMMONIA" in the last 168 hours. CBC: Recent Labs  Lab 05/08/23 1403 05/09/23 0413 05/10/23 0440  WBC 4.6 3.8* 7.0  HGB 9.3* 9.3* 10.0*  HCT 30.4* 30.6* 32.1*  MCV 81.1 81.6 79.9*  PLT 146* 128* 173   Cardiac Enzymes: No results for input(s): "CKTOTAL", "CKMB", "CKMBINDEX", "TROPONINI" in the last 168 hours. BNP: Invalid input(s): "POCBNP" CBG: No results for input(s): "GLUCAP" in the last 168 hours. D-Dimer No results for input(s): "DDIMER" in the last 72 hours. Hgb A1c No results for input(s): "HGBA1C" in the last 72 hours. Lipid Profile No results for input(s): "CHOL", "HDL", "LDLCALC", "TRIG", "CHOLHDL", "LDLDIRECT" in the last 72 hours. Thyroid function studies No results for input(s): "TSH", "T4TOTAL", "T3FREE", "THYROIDAB" in the last 72 hours.  Invalid input(s): "FREET3" Anemia work up No results for input(s): "VITAMINB12", "FOLATE", "FERRITIN", "TIBC", "IRON", "RETICCTPCT" in the last 72 hours. Urinalysis    Component Value Date/Time   COLORURINE YELLOW 12/20/2013 0530   APPEARANCEUR CLOUDY (A) 12/20/2013 0530   LABSPEC 1.009 12/20/2013 0530   PHURINE 8.0 12/20/2013 0530   GLUCOSEU 100 (A) 12/20/2013 0530   HGBUR LARGE (A) 12/20/2013 0530   BILIRUBINUR NEGATIVE 12/20/2013 0530   KETONESUR NEGATIVE 12/20/2013 0530   PROTEINUR 100 (A) 12/20/2013 0530   UROBILINOGEN 0.2 12/20/2013 0530   NITRITE NEGATIVE 12/20/2013 0530   LEUKOCYTESUR TRACE (A) 12/20/2013 0530   Sepsis Labs Recent Labs  Lab 05/08/23 1403 05/09/23 0413 05/10/23 0440  WBC 4.6 3.8* 7.0   Microbiology Recent Results (from  the past 240 hours)  Resp panel by RT-PCR (RSV, Flu A&B, Covid) Anterior Nasal Swab     Status: Abnormal   Collection Time: 05/08/23  2:44 PM   Specimen: Anterior Nasal Swab  Result Value Ref Range Status   SARS Coronavirus 2 by RT PCR NEGATIVE NEGATIVE Final   Influenza A by PCR POSITIVE (A) NEGATIVE Final   Influenza B by PCR NEGATIVE NEGATIVE  Final    Comment: (NOTE) The Xpert Xpress SARS-CoV-2/FLU/RSV plus assay is intended as an aid in the diagnosis of influenza from Nasopharyngeal swab specimens and should not be used as a sole basis for treatment. Nasal washings and aspirates are unacceptable for Xpert Xpress SARS-CoV-2/FLU/RSV testing.  Fact Sheet for Patients: BloggerCourse.com  Fact Sheet for Healthcare Providers: SeriousBroker.it  This test is not yet approved or cleared by the [place] FDA and has been authorized for detection and/or diagnosis of SARS-CoV-2 by FDA under an Emergency Use Authorization (EUA). This EUA will remain in effect (meaning this test can be used) for the duration of the COVID-19 declaration under Section 564(b)(1) of the Act, 21 U.S.C. section 360bbb-3(b)(1), unless the authorization is terminated or revoked.     Resp Syncytial Virus by PCR NEGATIVE NEGATIVE Final    Comment: (NOTE) Fact Sheet for Patients: BloggerCourse.com  Fact Sheet for Healthcare Providers: SeriousBroker.it  This test is not yet approved or cleared by the [place] FDA and has been authorized for detection and/or diagnosis of SARS-CoV-2 by FDA under an Emergency Use Authorization (EUA). This EUA will remain in effect (meaning this test can be used) for the duration of the COVID-19 declaration under Section 564(b)(1) of the Act, 21 U.S.C. section 360bbb-3(b)(1), unless the authorization is terminated or revoked.  Performed at [facility], [address] [place], [place] [address]      Time coordinating discharge: 35 minutes  SIGNED:   Glade Lloyd, MD  [facility] 05/10/2023, 12:48 PM

## 2023-05-10 NOTE — Discharge Planning (Addendum)
 [facility] Patient Discharge Orders- [facility] CLINIC: [facility]  Patient's name: MALIYA MARICH Admit/DC Dates: 05/08/2023 - 05/10/2023  Discharge Diagnoses: Influenza A/Acute asthma exacerbation   Chronic hypoxic respiratory failure  Aranesp: Given: No   Date and amount of last dose: NA  Last Hgb: 10.0 PRBC's Given: No Date/# of units: NA ESA dose for discharge: mircera 100 mcg IV q 2 weeks  IV Iron dose at discharge: Per protocol. Fe must be diluted in NS  Heparin change: No  EDW Change: Yes New EDW: 67 kg  Bath Change: No  Access intervention/Change: No Details:  Hectorol/Calcitriol change: NA  Discharge Labs: Calcium 9.1 Phosphorus 3.9 Albumin 3.1 K+ 3.9  IV Antibiotics: NA Details:  On Coumadin?: NA Last INR: Next INR: Managed By:   OTHER/APPTS/LAB ORDERS:    D/C Meds to be reconciled by nurse after every discharge.  Completed By: Alonna Buckler [facility] [phone]   Reviewed by: MD:______ RN_______

## 2023-05-10 NOTE — Progress Notes (Signed)
 [facility] Progress Note   Subjective:    Last HD on 2/23 with 2 kg UF.  States that she wears oxygen at night. Feels better.  States that she dialyzes early AM and has two medical appointments tomorrow.  She states she doesn't know who her doctor is.  She took herself off of oxygen to go to the restroom.   Review of systems:  States chest hurts from coughing  Shortness of breath is better No n/v; eating lunch    Objective Vitals:   05/09/23 2258 05/10/23 0500 05/10/23 0559 05/10/23 0819  BP: 120/65  106/67 114/63  Pulse: 60  62 60  Resp: 18  18   Temp: 98.9 F (37.2 C)  98.3 F (36.8 C) 98.3 F (36.8 C)  TempSrc: Oral  Oral Oral  SpO2: (!) 88%   98%  Weight:  67 kg    Height:       Physical Exam  General adult female in bed in no acute distress HEENT normocephalic atraumatic extraocular movements intact sclera anicteric Neck supple trachea midline Lungs clear to auscultation bilaterally normal work of breathing at rest  Heart S1S2 no rub Abdomen soft nontender nondistended Extremities no lower extremity edema  Psych normal mood and affect Access LUE AVF with bruit   Filed Weights   05/09/23 1415 05/09/23 1500 05/10/23 0500  Weight: 66.9 kg 67 kg 67 kg    Intake/Output Summary (Last 24 hours) at 05/10/2023 1204 Last data filed at 05/09/2023 1700 Gross per 24 hour  Intake 360 ml  Output 2000 ml  Net -1640 ml    Additional Objective Labs: Basic Metabolic Panel: Recent Labs  Lab 05/08/23 1403 05/08/23 2348 05/09/23 0413 05/10/23 0440  NA 138  --  136 136  K 4.6  --  5.1 3.9  CL 96*  --  92* 94*  CO2 24  --  22 28  GLUCOSE 84  --  168* 136*  BUN 74*  --  83* 51*  CREATININE 16.06*  --  16.89* 10.33*  CALCIUM 9.6  --  9.4 9.1  PHOS  --  5.5*  --  3.9   Liver Function Tests: Recent Labs  Lab 05/08/23 1403  AST 21  ALT 24  ALKPHOS 78  BILITOT 1.0  PROT 7.7  ALBUMIN 3.1*   Recent Labs  Lab 05/08/23 1403  LIPASE 65*    CBC: Recent Labs  Lab 05/08/23 1403 05/09/23 0413 05/10/23 0440  WBC 4.6 3.8* 7.0  HGB 9.3* 9.3* 10.0*  HCT 30.4* 30.6* 32.1*  MCV 81.1 81.6 79.9*  PLT 146* 128* 173   Blood Culture    Component Value Date/Time   SDES BLOOD BLOOD RIGHT HAND 10/01/2022 2146   SDES BLOOD BLOOD RIGHT HAND 10/01/2022 2146   SPECREQUEST  10/01/2022 2146    BOTTLES DRAWN AEROBIC AND ANAEROBIC Blood Culture adequate volume   SPECREQUEST  10/01/2022 2146    BOTTLES DRAWN AEROBIC AND ANAEROBIC Blood Culture adequate volume   CULT  10/01/2022 2146    NO GROWTH 5 DAYS Performed at [facility] Lab, [address] [place], [place] [address]    CULT  10/01/2022 2146    NO GROWTH 5 DAYS Performed at [facility] Lab, [address], [place], [place] [address]    REPTSTATUS 10/06/2022 FINAL 10/01/2022 2146   REPTSTATUS 10/06/2022 FINAL 10/01/2022 2146    Cardiac Enzymes: No results for input(s): "CKTOTAL", "CKMB", "CKMBINDEX", "TROPONINI" in the last 168 hours. CBG: No  results for input(s): "GLUCAP" in the last 168 hours. Iron Studies: No results for input(s): "IRON", "TIBC", "TRANSFERRIN", "FERRITIN" in the last 72 hours. Lab Results  Component Value Date   INR 1.2 09/12/2022   INR 1.2 01/10/2021   INR 1.21 04/12/2018   Studies/Results: DG Chest Portable 1 View Result Date: 05/08/2023 CLINICAL DATA:  Short of breath EXAM: PORTABLE CHEST 1 VIEW COMPARISON:  11/03/2022 FINDINGS: Single frontal view of the chest demonstrates stable enlargement of the cardiac silhouette. There is increased pulmonary vascular congestion, without overt edema. No effusion or pneumothorax. No acute bony abnormalities. IMPRESSION: 1. Pulmonary vascular congestion without overt edema. 2. Stable enlarged cardiac silhouette. Electronically Signed   By: Sharlet Salina M.D.   On: 05/08/2023 16:49    Medications:   amiodarone  200 mg Oral Daily   apixaban  5 mg Oral BID   atorvastatin  80 mg Oral Daily    Chlorhexidine Gluconate Cloth  6 each Topical Q0600   clopidogrel  75 mg Oral Q breakfast   feeding supplement (NEPRO CARB STEADY)  237 mL Oral BID BM   ferric citrate  630 mg Oral TID WC   ipratropium-albuterol  3 mL Nebulization Q6H   methylPREDNISolone (SOLU-MEDROL) injection  40 mg Intravenous Daily   mometasone-formoterol  2 puff Inhalation BID   [START ON 05/11/2023] oseltamivir  30 mg Oral Q T,Th,Sa-HD   pantoprazole  40 mg Oral Daily   sevelamer carbonate  2,400 mg Oral TID WC   sodium chloride flush  3 mL Intravenous Q12H    Dialysis Orders: SW TTS 3h  B400  70.5kg  2K bath  LUA AVF  Heparin 6600 -Micera every 2 weeks - last dose 2/18  Renal-related home meds: - midodrine 10 tid prn - Renvela 3 tabs with meals AND Auryxia 3 tabs with meals   Assessment/Plan: Influenza A/ acute asthma exacerbation/ chronic hypoxic resp failure - on tamiflu per primary team.  Cont systemic steroids, albuterol nebs prn, supp O2, and supportive care per pmd. She is on 3L O2 at home.  N/V/D - supportive care per pmd ESRD - on HD TTS HTN - controlled  Anemia of esrd - Hb 10.0 no acute needs.  Note last ESA given in outpatient was 2/18. Next dose due 2/25 if she is still here.  Secondary hyperparathyroidism - hyperphos on binders.  Note she is on [place]. Atrial fibrillation - per primary team   Disposition - per primary team.  I reached out to the primary team   Estanislado Emms, MD 12:40 PM 05/10/2023

## 2023-05-10 NOTE — Plan of Care (Signed)

## 2023-05-10 NOTE — TOC Transition Note (Signed)
 Transition of Care [facility]) - Discharge Note   Patient Details  Name: Desiree [name] MRN: [id number] Date of Birth: 1975/10/15  Transition of Care [facility]) CM/SW Contact:  Tom-Halt, Hershal Coria, RN Phone Number: 05/10/2023, 1:10 PM   Clinical Narrative:     Patient is scheduled for discharge today.  Readmission Risk Assessment done. Outpatient f/u, hospital f/u and discharge instructions on AVS. No TOC needs or recommendations noted. Husband, Desiree [name] to transport at discharge.  No further TOC needs noted.          Final next level of care: Home/Self Care Barriers to Discharge: Barriers Resolved   Patient Goals and CMS Choice Patient states their goals for this hospitalization and ongoing recovery are:: To return home CMS Medicare.gov Compare Post Acute Care list provided to:: Patient Choice offered to / list presented to : NA      Discharge Placement                Patient to be transferred to facility by: Husband Name of family member notified: Desiree [name]    Discharge Plan and Services Additional resources added to the After Visit Summary for                  DME Arranged: N/A DME Agency: NA       HH Arranged: NA HH Agency: NA        Social Drivers of Health (SDOH) Interventions SDOH Screenings   Food Insecurity: No Food Insecurity (05/09/2023)  Housing: High Risk (05/09/2023)  Transportation Needs: No Transportation Needs (05/09/2023)  Utilities: Not At Risk (05/09/2023)  Tobacco Use: Medium Risk (05/08/2023)     Readmission Risk Interventions    05/10/2023    1:09 PM 09/14/2022    3:42 PM  Readmission Risk Prevention Plan  Transportation Screening Complete Complete  Medication Review (RN Care Manager) Referral to Pharmacy Complete  PCP or Specialist appointment within 3-5 days of discharge Complete   HRI or Home Care Consult  Complete  SW Recovery Care/Counseling Consult Complete Complete  Palliative Care Screening Not Applicable Not  Applicable  Skilled Nursing Facility Not Applicable Not Applicable

## 2023-05-10 NOTE — Plan of Care (Signed)
 Problem: Clinical Measurements: Goal: Ability to maintain clinical measurements within normal limits will improve Outcome: Progressing Goal: Will remain free from infection Outcome: Progressing Goal: Diagnostic test results will improve Outcome: Progressing Goal: Cardiovascular complication will be avoided Outcome: Progressing   Problem: Activity: Goal: Risk for activity intolerance will decrease Outcome: Progressing   Problem: Nutrition: Goal: Adequate nutrition will be maintained Outcome: Progressing   Problem: Coping: Goal: Level of anxiety will decrease Outcome: Progressing   Problem: Elimination: Goal: Will not experience complications related to bowel motility Outcome: Progressing Goal: Will not experience complications related to urinary retention Outcome: Progressing   Problem: Pain Managment: Goal: General experience of comfort will improve and/or be controlled Outcome: Progressing   Problem: Safety: Goal: Ability to remain free from injury will improve Outcome: Progressing   Problem: Skin Integrity: Goal: Risk for impaired skin integrity will decrease Outcome: Progressing   Problem: Activity: Goal: Ability to tolerate increased activity will improve Outcome: Progressing   Problem: Clinical Measurements: Goal: Ability to maintain a body temperature in the normal range will improve Outcome: Progressing   Problem: Respiratory: Goal: Ability to maintain adequate ventilation will improve Outcome: Progressing Goal: Ability to maintain a clear airway will improve Outcome: Progressing       Revision History

## 2023-05-10 NOTE — Progress Notes (Signed)
 PROGRESS NOTE    Desiree [name]  ZOX:[id number] DOB: November 26, 1975 DOA: 05/08/2023 PCP: Jearld Lesch, MD   Brief Narrative:  48 year old African-American female with past medical history of end-stage renal disease on hemodialysis, CAD status post anterior STEMI with thrombus with PTCA to 2 vessels, paroxysmal A-fib on anticoagulation, anemia of chronic kidney disease, chronic respiratory failure on oxygen at 3 L/minute, chronic systolic heart failure with EF of 45%, chronic pericardial effusion presented with cough, nausea, vomiting and diarrhea after missing 2 last dialysis sessions.  On presentation, she tested positive for influenza A.  Chest x-ray showed vascular congestion.  Patient was treated with Decadron, nebs, Zofran.  Nephrology was consulted.  Assessment & Plan:   Influenza A acute bronchitis Acute asthma exacerbation Chronic hypoxic respiratory failure -Respiratory status currently stable: On 3 L oxygen via nasal cannula which patient normally wears at home. -Continue Tamiflu along with steroids and nebs.  Nausea, vomiting, diarrhea -Abdominal exam is benign.  Possibly from influenza.  Continue supportive care -Symptoms are improving.  Tolerating diet.  End-stage renal disease on hemodialysis -Nephrology following.  Dialysis as per nephrology schedule  Paroxysmal A-fib -Rate mostly controlled.  DC telemetry.  Continue amiodarone and Eliquis  Chronic history of heart failure -Compensated.  Volume managed by dialysis.  Strict input output.  Daily weights.  Fluid restriction  Anemia of chronic disease -From renal failure.  Hemoglobin stable  Thrombocytopenia -questionable cause.  Monitor intermittently.  No signs of bleeding  Hyperphosphatemia - managed by nephrology by hemodialysis.  Improved.   DVT prophylaxis: Eliquis Code Status: Full Family Communication: None at bedside Disposition Plan: Status is: Inpatient Remains inpatient appropriate because: Of  severity of illness  Consultants: Nephrology  Procedures: None  Antimicrobials: None   Subjective: Patient seen and examined at bedside.  Still wheezing, coughing and short of breath with exertion.  Does not feel ready to go home today.  No fever, vomiting, chest pain reported.   Objective: Vitals:   05/09/23 2258 05/10/23 0500 05/10/23 0559 05/10/23 0819  BP: 120/65  106/67 114/63  Pulse: 60  62 60  Resp: 18  18   Temp: 98.9 F (37.2 C)  98.3 F (36.8 C) 98.3 F (36.8 C)  TempSrc: Oral  Oral Oral  SpO2: (!) 88%   98%  Weight:  67 kg    Height:        Intake/Output Summary (Last 24 hours) at 05/10/2023 1006 Last data filed at 05/09/2023 1700 Gross per 24 hour  Intake 360 ml  Output 2000 ml  Net -1640 ml   Filed Weights   05/09/23 1415 05/09/23 1500 05/10/23 0500  Weight: 66.9 kg 67 kg 67 kg    Examination:  General: On 2 L oxygen via nasal cannula.  No distress ENT/neck: No thyromegaly.  JVD is not elevated  respiratory: Decreased breath sounds at bases bilaterally with scattered wheezing CVS: S1-S2 heard, rate controlled currently Abdominal: Soft, nontender, slightly distended; no organomegaly, normal bowel sounds are heard Extremities: Trace lower extremity edema; no cyanosis  CNS: Awake and alert.  No focal neurologic deficit.  Moves extremities Lymph: No obvious lymphadenopathy Skin: No obvious ecchymosis/lesions  psych: Flat affect.  Currently not agitated.   Musculoskeletal: No obvious joint swelling/deformity    Data Reviewed: I have personally reviewed following labs and imaging studies  CBC: Recent Labs  Lab 05/08/23 1403 05/09/23 0413 05/10/23 0440  WBC 4.6 3.8* 7.0  HGB 9.3* 9.3* 10.0*  HCT 30.4* 30.6* 32.1*  MCV 81.1  81.6 79.9*  PLT 146* 128* 173   Basic Metabolic Panel: Recent Labs  Lab 05/08/23 1403 05/08/23 2348 05/09/23 0413 05/10/23 0440  NA 138  --  136 136  K 4.6  --  5.1 3.9  CL 96*  --  92* 94*  CO2 24  --  22 28   GLUCOSE 84  --  168* 136*  BUN 74*  --  83* 51*  CREATININE 16.06*  --  16.89* 10.33*  CALCIUM 9.6  --  9.4 9.1  MG  --   --   --  2.0  PHOS  --  5.5*  --  3.9   GFR: Estimated Creatinine Clearance: 6.3 mL/min (A) (by C-G formula based on SCr of 10.33 mg/dL (H)). Liver Function Tests: Recent Labs  Lab 05/08/23 1403  AST 21  ALT 24  ALKPHOS 78  BILITOT 1.0  PROT 7.7  ALBUMIN 3.1*   Recent Labs  Lab 05/08/23 1403  LIPASE 65*   No results for input(s): "AMMONIA" in the last 168 hours. Coagulation Profile: No results for input(s): "INR", "PROTIME" in the last 168 hours. Cardiac Enzymes: No results for input(s): "CKTOTAL", "CKMB", "CKMBINDEX", "TROPONINI" in the last 168 hours. BNP (last 3 results) No results for input(s): "PROBNP" in the last 8760 hours. HbA1C: No results for input(s): "HGBA1C" in the last 72 hours. CBG: No results for input(s): "GLUCAP" in the last 168 hours. Lipid Profile: No results for input(s): "CHOL", "HDL", "LDLCALC", "TRIG", "CHOLHDL", "LDLDIRECT" in the last 72 hours. Thyroid Function Tests: No results for input(s): "TSH", "T4TOTAL", "FREET4", "T3FREE", "THYROIDAB" in the last 72 hours. Anemia Panel: No results for input(s): "VITAMINB12", "FOLATE", "FERRITIN", "TIBC", "IRON", "RETICCTPCT" in the last 72 hours. Sepsis Labs: No results for input(s): "PROCALCITON", "LATICACIDVEN" in the last 168 hours.  Recent Results (from the past 240 hours)  Resp panel by RT-PCR (RSV, Flu A&B, Covid) Anterior Nasal Swab     Status: Abnormal   Collection Time: 05/08/23  2:44 PM   Specimen: Anterior Nasal Swab  Result Value Ref Range Status   SARS Coronavirus 2 by RT PCR NEGATIVE NEGATIVE Final   Influenza A by PCR POSITIVE (A) NEGATIVE Final   Influenza B by PCR NEGATIVE NEGATIVE Final    Comment: (NOTE) The Xpert Xpress SARS-CoV-2/FLU/RSV plus assay is intended as an aid in the diagnosis of influenza from Nasopharyngeal swab specimens and should not be  used as a sole basis for treatment. Nasal washings and aspirates are unacceptable for Xpert Xpress SARS-CoV-2/FLU/RSV testing.  Fact Sheet for Patients: BloggerCourse.com  Fact Sheet for Healthcare Providers: SeriousBroker.it  This test is not yet approved or cleared by the [place] FDA and has been authorized for detection and/or diagnosis of SARS-CoV-2 by FDA under an Emergency Use Authorization (EUA). This EUA will remain in effect (meaning this test can be used) for the duration of the COVID-19 declaration under Section 564(b)(1) of the Act, 21 U.S.C. section 360bbb-3(b)(1), unless the authorization is terminated or revoked.     Resp Syncytial Virus by PCR NEGATIVE NEGATIVE Final    Comment: (NOTE) Fact Sheet for Patients: BloggerCourse.com  Fact Sheet for Healthcare Providers: SeriousBroker.it  This test is not yet approved or cleared by the [place] FDA and has been authorized for detection and/or diagnosis of SARS-CoV-2 by FDA under an Emergency Use Authorization (EUA). This EUA will remain in effect (meaning this test can be used) for the duration of the COVID-19 declaration under Section 564(b)(1) of the Act, 21  U.S.C. section 360bbb-3(b)(1), unless the authorization is terminated or revoked.  Performed at [facility] Lab, [address] [place], [place] [address]          Radiology Studies: DG Chest Portable 1 View Result Date: 05/08/2023 CLINICAL DATA:  Short of breath EXAM: PORTABLE CHEST 1 VIEW COMPARISON:  11/03/2022 FINDINGS: Single frontal view of the chest demonstrates stable enlargement of the cardiac silhouette. There is increased pulmonary vascular congestion, without overt edema. No effusion or pneumothorax. No acute bony abnormalities. IMPRESSION: 1. Pulmonary vascular congestion without overt edema. 2. Stable enlarged cardiac silhouette.  Electronically Signed   By: Sharlet Salina M.D.   On: 05/08/2023 16:49        Scheduled Meds:  amiodarone  200 mg Oral Daily   apixaban  5 mg Oral BID   atorvastatin  80 mg Oral Daily   Chlorhexidine Gluconate Cloth  6 each Topical Q0600   clopidogrel  75 mg Oral Q breakfast   feeding supplement (NEPRO CARB STEADY)  237 mL Oral BID BM   ferric citrate  630 mg Oral TID WC   ipratropium-albuterol  3 mL Nebulization Q6H   methylPREDNISolone (SOLU-MEDROL) injection  40 mg Intravenous Daily   mometasone-formoterol  2 puff Inhalation BID   [START ON 05/11/2023] oseltamivir  30 mg Oral Q T,Th,Sa-HD   pantoprazole  40 mg Oral Daily   sevelamer carbonate  2,400 mg Oral TID WC   sodium chloride flush  3 mL Intravenous Q12H   Continuous Infusions:        Glade Lloyd, MD [facility] 05/10/2023, 10:06 AM

## 2023-05-11 LAB — HEPATITIS B SURFACE ANTIBODY, QUANTITATIVE: Hep B S AB Quant (Post): 45.8 m[IU]/mL

## 2023-05-13 ENCOUNTER — Other Ambulatory Visit (HOSPITAL_BASED_OUTPATIENT_CLINIC_OR_DEPARTMENT_OTHER): Payer: Self-pay

## 2023-05-13 ENCOUNTER — Telehealth (HOSPITAL_BASED_OUTPATIENT_CLINIC_OR_DEPARTMENT_OTHER): Payer: Self-pay | Admitting: Pulmonary Disease

## 2023-05-13 DIAGNOSIS — J45998 Other asthma: Secondary | ICD-10-CM

## 2023-05-13 DIAGNOSIS — J9611 Chronic respiratory failure with hypoxia: Secondary | ICD-10-CM

## 2023-05-13 NOTE — Telephone Encounter (Signed)
 Patient called in after being discharged from the hospital regarding needing a new nebulizer with albuterol. Advised patient LOV was 04/30/2022 and typically if patient hasn't been seen within the year meds cannot be ordered. This was explained multiple times while on the phone. Patient said multiple times on her discharge papers she was told to call us for this to be ordered after explaining why this cannot be called in  Patient has been scheduled with the soonest available on 06/08/2023,. Please advise if patient has to wait or this can be order placed until she is seen.  Call back # [phone]

## 2023-05-13 NOTE — Telephone Encounter (Signed)
 Patient has been notified and explained to patient the importance of making it to her 3/25 appointment.   Patient would like to know about how long it would be before she heard that her neb machine is ready? How long does it usually take? Advised not entirely sure but will ask.  Please advise and I will let the patient know

## 2023-05-13 NOTE — Telephone Encounter (Signed)
 Order placed

## 2023-05-13 NOTE — Telephone Encounter (Signed)
**Note De-identified  [place] Obfuscation** Please advise 

## 2023-05-14 NOTE — Telephone Encounter (Signed)
 Message from [facility]   Hebert Soho; Percell Locus; Darcel Smalling We are needing a progress note from the ordering provider mentioning the need for the neb please.

## 2023-05-19 ENCOUNTER — Ambulatory Visit (HOSPITAL_BASED_OUTPATIENT_CLINIC_OR_DEPARTMENT_OTHER): Payer: 59 | Admitting: Cardiology

## 2023-05-19 NOTE — Telephone Encounter (Signed)
 Patient checking on Nebulizer machine. Patient phone number is [phone].

## 2023-05-19 NOTE — Telephone Encounter (Signed)
 Hospital discharge doesn't include the need of a new neb just to continue on one.   Elease Hashimoto from [employer] is asking if provider can add the need for a neb in the OV notes from 2/14   Please advise, pt called about neb order status today

## 2023-05-20 NOTE — Telephone Encounter (Signed)
 Sending order to [facility] if [facility] does not accept hospital note. Desiree [name] is still asking for notes to be updated   Awaiting response from [facility]

## 2023-05-20 NOTE — Telephone Encounter (Signed)
 Per Dr Vassie Loll:  Desiree Milch, MD to [name]  [facility] Clinical Pool     05/19/23  3:20 PM Last visit was a year ago in 2024, so this cannot be amended. Please send prescription to a pharmacy or a different DME. Please inform patient that since we have not seen her we cannot provide her until her follow-up visit on 3/25  Called the pt and there was no answer and her VM was full  Will route to [facility] triage pool for f/u

## 2023-05-20 NOTE — Telephone Encounter (Signed)
 Patient calling again asking to speak to Fernande. Please advise and call patient back.

## 2023-05-20 NOTE — Telephone Encounter (Signed)
 [facility] RESPONSE:   Minta Balsam; Kathyrn Sheriff; Kathe Becton; Randie Heinz, Clovis Riley Nothing aside from the provider mentioning the need for the neb to treat the DX code. This can be a telehealth visit also.         [facility] RESPONSE:  [place], Delena Bali, Deri Fuelling; [place], [place]; Barnabas Harries; Able, Mel Almond Hello Not seeing clinicals about medication that will be administered thru nebulizer unit. Will not be able to process without this information listed.  Video visit will be needed to process Neb order for this pt   No DME will accept the hospital note  Please advise

## 2023-05-20 NOTE — Telephone Encounter (Signed)
 I called and spoke to pt. I informed pt that [facility] will not accept hospital notes for her neb start, however, she does have an upcoming appointment with Buelah Manis, NP on 06-08-23 and we could have this order sent in on that day with a new office visit note to send to the DME company. Pt verbalized understanding. NFN

## 2023-05-21 ENCOUNTER — Telehealth (HOSPITAL_BASED_OUTPATIENT_CLINIC_OR_DEPARTMENT_OTHER): Admitting: Pulmonary Disease

## 2023-05-21 DIAGNOSIS — J4530 Mild persistent asthma, uncomplicated: Secondary | ICD-10-CM

## 2023-05-21 NOTE — Progress Notes (Deleted)
   Subjective:    Patient ID: Desiree [name], female    DOB: 26-Aug-1975, 48 y.o.   MRN: [id number]  HPI  48 yo dialysis patient,  for FU of asthma &  chronic respiratory failure hypoxia She reports asthma as a child and has used albuterol MDI "too much" triggers are smoking cold air   PMH - ESRD on HD T/TH/Sat since 2010, nephrotic syndrome CAD status post anterior STEMI  HFrEF -45%  anxiety and depression,  GERD, hypertension  chronic respiratory failure 3 L oxygen -Cocaine and THC abuse - severe insomnia due to anxiety and unable to sleep during the night, she generally sleeps in the sun comes up and after a few nights like this will crash and sleep all day including during dialysis.     She was started on oxygen after cardiology office visit in September 2023 when she was found to desaturate. Evaluation during this visit included a CT angiogram that was negative for pulm embolism suggested pulmonary edema and echo showed normal LV function with calcification of the valves without stenosis   She was hospitalized 02/03/2022 for increasing dyspnea and oxygen needs and treated for community-acquired pneumonia and improved after dialysis. CT abdomen/pelvis was negative for abdominal pain       Allergies-include Tylenol and prednisone   Oxygen saturation today on room air was 97%   Significant tests/ events reviewed   12/04/21  cardiology OV >>ambulatory O2 evaluation: Sitting, on room air: O2 94% Room air, walking at slow pace: Range 77-82% On 3L O2 by nasal cannula, sitting: 97% On 3L O2 by nasal cannula, walking: 94%   01/2022 CT chest -airspace disease in the lingula and both lower lobes with pulmonary edema pattern and small effusions  Review of Systems     Objective:   Physical Exam        Assessment & Plan:

## 2023-05-21 NOTE — Telephone Encounter (Signed)
 Pt has video visit today with Vassie Loll at 8:45am   Called pt in attempt to confirm she is aware.  No answer, mailbox full

## 2023-06-08 ENCOUNTER — Ambulatory Visit (HOSPITAL_BASED_OUTPATIENT_CLINIC_OR_DEPARTMENT_OTHER): Payer: 59 | Admitting: Primary Care

## 2023-07-02 ENCOUNTER — Ambulatory Visit (HOSPITAL_BASED_OUTPATIENT_CLINIC_OR_DEPARTMENT_OTHER): Admitting: Cardiology

## 2023-08-24 ENCOUNTER — Emergency Department (HOSPITAL_BASED_OUTPATIENT_CLINIC_OR_DEPARTMENT_OTHER): Admitting: Radiology

## 2023-08-24 ENCOUNTER — Other Ambulatory Visit: Payer: Self-pay

## 2023-08-24 ENCOUNTER — Inpatient Hospital Stay (HOSPITAL_BASED_OUTPATIENT_CLINIC_OR_DEPARTMENT_OTHER)
Admission: EM | Admit: 2023-08-24 | Discharge: 2023-08-27 | DRG: 391 | Disposition: A | Discharge status: Home / Self Care | Attending: Family Medicine | Admitting: Family Medicine

## 2023-08-24 ENCOUNTER — Encounter (HOSPITAL_BASED_OUTPATIENT_CLINIC_OR_DEPARTMENT_OTHER): Payer: Self-pay

## 2023-08-24 DIAGNOSIS — I428 Other cardiomyopathies: Secondary | ICD-10-CM | POA: Diagnosis present

## 2023-08-24 DIAGNOSIS — I5022 Chronic systolic (congestive) heart failure: Secondary | ICD-10-CM | POA: Diagnosis present

## 2023-08-24 DIAGNOSIS — Z888 Allergy status to other drugs, medicaments and biological substances status: Secondary | ICD-10-CM

## 2023-08-24 DIAGNOSIS — Z7901 Long term (current) use of anticoagulants: Secondary | ICD-10-CM

## 2023-08-24 DIAGNOSIS — E8889 Other specified metabolic disorders: Secondary | ICD-10-CM | POA: Diagnosis present

## 2023-08-24 DIAGNOSIS — K224 Dyskinesia of esophagus: Principal | ICD-10-CM | POA: Diagnosis present

## 2023-08-24 DIAGNOSIS — I132 Hypertensive heart and chronic kidney disease with heart failure and with stage 5 chronic kidney disease, or end stage renal disease: Secondary | ICD-10-CM | POA: Diagnosis present

## 2023-08-24 DIAGNOSIS — R079 Chest pain, unspecified: Secondary | ICD-10-CM | POA: Diagnosis present

## 2023-08-24 DIAGNOSIS — I252 Old myocardial infarction: Secondary | ICD-10-CM

## 2023-08-24 DIAGNOSIS — Z8249 Family history of ischemic heart disease and other diseases of the circulatory system: Secondary | ICD-10-CM

## 2023-08-24 DIAGNOSIS — Z7902 Long term (current) use of antithrombotics/antiplatelets: Secondary | ICD-10-CM

## 2023-08-24 DIAGNOSIS — I9589 Other hypotension: Secondary | ICD-10-CM | POA: Diagnosis present

## 2023-08-24 DIAGNOSIS — R7989 Other specified abnormal findings of blood chemistry: Principal | ICD-10-CM | POA: Diagnosis present

## 2023-08-24 DIAGNOSIS — Z87891 Personal history of nicotine dependence: Secondary | ICD-10-CM

## 2023-08-24 DIAGNOSIS — N186 End stage renal disease: Secondary | ICD-10-CM | POA: Diagnosis present

## 2023-08-24 DIAGNOSIS — D631 Anemia in chronic kidney disease: Secondary | ICD-10-CM | POA: Diagnosis present

## 2023-08-24 DIAGNOSIS — I480 Paroxysmal atrial fibrillation: Secondary | ICD-10-CM | POA: Diagnosis present

## 2023-08-24 DIAGNOSIS — J9611 Chronic respiratory failure with hypoxia: Secondary | ICD-10-CM | POA: Diagnosis present

## 2023-08-24 DIAGNOSIS — K219 Gastro-esophageal reflux disease without esophagitis: Secondary | ICD-10-CM | POA: Diagnosis present

## 2023-08-24 DIAGNOSIS — Z885 Allergy status to narcotic agent status: Secondary | ICD-10-CM

## 2023-08-24 DIAGNOSIS — Z992 Dependence on renal dialysis: Secondary | ICD-10-CM

## 2023-08-24 DIAGNOSIS — Z79899 Other long term (current) drug therapy: Secondary | ICD-10-CM

## 2023-08-24 DIAGNOSIS — Z833 Family history of diabetes mellitus: Secondary | ICD-10-CM

## 2023-08-24 DIAGNOSIS — R1310 Dysphagia, unspecified: Secondary | ICD-10-CM | POA: Diagnosis present

## 2023-08-24 DIAGNOSIS — I2510 Atherosclerotic heart disease of native coronary artery without angina pectoris: Secondary | ICD-10-CM | POA: Diagnosis present

## 2023-08-24 DIAGNOSIS — Z9981 Dependence on supplemental oxygen: Secondary | ICD-10-CM

## 2023-08-24 DIAGNOSIS — Z91041 Radiographic dye allergy status: Secondary | ICD-10-CM

## 2023-08-24 DIAGNOSIS — J449 Chronic obstructive pulmonary disease, unspecified: Secondary | ICD-10-CM | POA: Diagnosis present

## 2023-08-24 DIAGNOSIS — R001 Bradycardia, unspecified: Secondary | ICD-10-CM | POA: Diagnosis present

## 2023-08-24 DIAGNOSIS — Z83438 Family history of other disorder of lipoprotein metabolism and other lipidemia: Secondary | ICD-10-CM

## 2023-08-24 DIAGNOSIS — R12 Heartburn: Secondary | ICD-10-CM | POA: Diagnosis present

## 2023-08-24 DIAGNOSIS — G40909 Epilepsy, unspecified, not intractable, without status epilepticus: Secondary | ICD-10-CM | POA: Diagnosis present

## 2023-08-24 LAB — CBC
HCT: 41 % (ref 36.0–46.0)
Hemoglobin: 12.8 g/dL (ref 12.0–15.0)
MCH: 27.5 pg (ref 26.0–34.0)
MCHC: 31.2 g/dL (ref 30.0–36.0)
MCV: 88.2 fL (ref 80.0–100.0)
Platelets: 204 10*3/uL (ref 150–400)
RBC: 4.65 MIL/uL (ref 3.87–5.11)
RDW: 18.9 % — ABNORMAL HIGH (ref 11.5–15.5)
WBC: 5.7 10*3/uL (ref 4.0–10.5)
nRBC: 0 % (ref 0.0–0.2)

## 2023-08-24 LAB — COMPREHENSIVE METABOLIC PANEL WITH GFR
ALT: 35 U/L (ref 0–44)
AST: 29 U/L (ref 15–41)
Albumin: 4.5 g/dL (ref 3.5–5.0)
Alkaline Phosphatase: 226 U/L — ABNORMAL HIGH (ref 38–126)
Anion gap: 19 — ABNORMAL HIGH (ref 5–15)
BUN: 32 mg/dL — ABNORMAL HIGH (ref 6–20)
CO2: 28 mmol/L (ref 22–32)
Calcium: 9.7 mg/dL (ref 8.9–10.3)
Chloride: 93 mmol/L — ABNORMAL LOW (ref 98–111)
Creatinine, Ser: 7.8 mg/dL — ABNORMAL HIGH (ref 0.44–1.00)
GFR, Estimated: 6 mL/min — ABNORMAL LOW (ref >60)
Glucose, Bld: 88 mg/dL (ref 70–99)
Potassium: 4.5 mmol/L (ref 3.5–5.1)
Sodium: 140 mmol/L (ref 135–145)
Total Bilirubin: 0.4 mg/dL (ref 0.0–1.2)
Total Protein: 9.1 g/dL — ABNORMAL HIGH (ref 6.5–8.1)

## 2023-08-24 LAB — TROPONIN T, HIGH SENSITIVITY: Troponin T High Sensitivity: 144 ng/L (ref <19)

## 2023-08-24 MED ORDER — FENTANYL CITRATE PF 50 MCG/ML IJ SOSY
12.5000 ug | PREFILLED_SYRINGE | Freq: Once | INTRAMUSCULAR | Status: AC
  Administered 2023-08-24: 12.5 ug via INTRAVENOUS
  Filled 2023-08-24: qty 1

## 2023-08-24 MED ORDER — ASPIRIN 81 MG PO CHEW
324.0000 mg | CHEWABLE_TABLET | Freq: Once | ORAL | Status: AC
  Administered 2023-08-24: 324 mg via ORAL
  Filled 2023-08-24: qty 4

## 2023-08-24 MED ORDER — MIDODRINE HCL 5 MG PO TABS
10.0000 mg | ORAL_TABLET | Freq: Once | ORAL | Status: AC
  Administered 2023-08-24: 10 mg via ORAL
  Filled 2023-08-24: qty 2

## 2023-08-24 MED ORDER — HEPARIN (PORCINE) 25000 UT/250ML-% IV SOLN
850.0000 [IU]/h | INTRAVENOUS | Status: DC
  Administered 2023-08-24 – 2023-08-26 (×2): 850 [IU]/h via INTRAVENOUS
  Filled 2023-08-24 (×2): qty 250

## 2023-08-24 NOTE — ED Triage Notes (Signed)
 Pt presents c/o low BP x1 month, central CP ongoing x1 week, worsening today. Pt endorses some SHOB, is supposed to be on 3L during dialysis treatments, PRN, and at night. Dialysis T TH SAT, completed today. Also endorses dizziness.

## 2023-08-24 NOTE — ED Notes (Signed)
 Significant cardiac hx, hx MI June 2024. Reports pain feels the same

## 2023-08-24 NOTE — ED Notes (Signed)
 CRITICAL VALUE STICKER  CRITICAL VALUE: Troponin 144  RECEIVER (on-site recipient of call): Willetta Harpin RN  DATE & TIME NOTIFIED: 9:51 PM   MD NOTIFIED: Schlossman   TIME OF NOTIFICATION: 9:51 PM

## 2023-08-24 NOTE — Progress Notes (Signed)
 PHARMACY - ANTICOAGULATION CONSULT NOTE  Pharmacy Consult for Heparin  dosing  Indication: chest pain/ACS  Allergies  Allergen Reactions   Iron  Sucrose Itching    Other Reaction(s): C/O of Being Hot, headache   Morphine  Shortness Of Breath and Anaphylaxis   Prednisone Other (See Comments)    Other reaction(s): Other (See Comments) Muscle spasms Patient says prednisone causes her to cramp all over, muscle spasms uncontrolled   Tuna [Fish Allergy] Itching, Swelling, Rash and Other (See Comments)    Face droops also   Amlodipine Other (See Comments)    Angioedema (09/05/17 ED visit)   Iodinated Contrast Media Itching   Tape Itching    Adhesive tape   paper tape ok    Patient Measurements: Height: 5\' 4"  (162.6 cm) Weight: 68 kg (150 lb) IBW/kg (Calculated) : 54.7 HEPARIN  DW (KG): 68  Vital Signs: Temp: 99 F (37.2 C) (06/10 2046) Temp Source: Oral (06/10 2046) BP: 93/54 (06/10 2200) Pulse Rate: 62 (06/10 2200)  Labs: Recent Labs    08/24/23 2059  HGB 12.8  HCT 41.0  PLT 204  CREATININE 7.80*    Estimated Creatinine Clearance: 8.4 mL/min (A) (by C-G formula based on SCr of 7.8 mg/dL (H)).   Medical History: Past Medical History:  Diagnosis Date   Anemia    Anxiety    panic attacks   Arthritis    bilateral knees   Childhood asthma    Complication of anesthesia    "sometimes it does not work; didn't during LEEP OR" (01/21/2016)   Depression    no med   ESRD (end stage renal disease) on dialysis (HCC)    "TTS; [facility]; [facility]" (08/03/2016)   GERD (gastroesophageal reflux disease)    nexium prn   Gout    History of blood transfusion    "related to kidneys; I've had 4" (01/21/2016)   Hypertension    Hypertensive urgency 12/05/2021   Migraine    last one 01/18/19   Myocardial infarction [facility]) 08/2022   Preterm labor ~ 2014   Retina hole, left    Seizures (HCC)    "last one was in 2000; related to preeclampsia" (01/21/2016)    Assessment: 48  YOF presented with CP and SOB. PMH of ESRD on HD, CAD, STEMI 08/2022 s/p angioplasty to OM and LAD. Patient on PTA Apixaban  for atrial fibrillation, with last dose 6/10 at ~1100 am today. Will require aPTT monitoring due to likely falsely high anti-Xa level secondary to DOAC use. Anticipating the anti-Xa level will be artifically elevated due to recent DOAC dose, will use aPTT to adjust heparin  infusion until correlating.   Goal of Therapy:  Heparin  level 0.3-0.7 units/ml aPTT 66-102 seconds Monitor platelets by anticoagulation protocol: Yes   Plan:  Defer heparin  bolus given recent PTA Apixaban  administration  Start heparin  infusion at 850 units/hr Check anti-Xa level in 6 hours and daily while on heparin  Continue to monitor via aPTT until levels are correlated Continue to monitor H&H and platelets  Desiree [name] 08/24/2023,10:55 PM

## 2023-08-24 NOTE — ED Notes (Signed)
Pt reports she does not produce urine 

## 2023-08-24 NOTE — ED Provider Notes (Signed)
 [place] EMERGENCY DEPARTMENT AT [facility] Provider Note   CSN: [id number] Arrival date & time: 08/24/23  2028     History {Add pertinent medical, surgical, social history, OB history to HPI:1} Chief Complaint  Patient presents with   Chest Pain   Shortness of Breath    Desiree [name] is a 48 y.o. female.  HPI     Feels like something stuck in middle of chest, then radiates all the way to back Has been like this all week Comes and goes Has difficulty sleeping, can take pain medications but still hurting. Days sometimes before going to sleep and crash.  Today tried to sleep but felt uncomfortable, pain all over.   Earlier was feeling short of breath, wears 3L of O2 at treatment and at night, now is not on O2 and does not feel dyspnea.       Blood pressures low over the last month. 2 weeks ago did feel sick one day and was vomiting a lot This morning had nausea, took medicine then went to treatment Take nausea medicine, put it under tongue went to dialysis. Still need to take eliquis  tonight.  No black or bloody stool, not noticed any bleeding No fevers, chills (is always cold) Today sneezing For the past 3-4 days air conditioning and fan on, thinks having hot flashes, waking up sweating. Has not had menses in 3 years.  Has not otherwise had sweating with the chest pain Appetite now but today didn't want to eat. Yesterday didn't eat well, ate a hamburger, today salad and couldn't eat it-has not really had anything to eat.  Has been eating about one meal a day. , appetite has been low   Her physicians have noticed blood pressures low.  They have changed her dry weight, added some fluid.   Midodrine  10mg  sometimes taking it 4 times a day, sometimes 3.  Whole month BP has been low.  Taking it twice today.  Percocet 10-325 for pain    Past Medical History:  Diagnosis Date   Anemia    Anxiety    panic attacks   Arthritis    bilateral knees   Childhood  asthma    Complication of anesthesia    "sometimes it does not work; didn't during LEEP OR" (01/21/2016)   Depression    no med   ESRD (end stage renal disease) on dialysis (HCC)    "TTS; [facility]; [facility]" (08/03/2016)   GERD (gastroesophageal reflux disease)    nexium prn   Gout    History of blood transfusion    "related to kidneys; I've had 4" (01/21/2016)   Hypertension    Hypertensive urgency 12/05/2021   Migraine    last one 01/18/19   Myocardial infarction [facility]) 08/2022   Preterm labor ~ 2014   Retina hole, left    Seizures (HCC)    "last one was in 2000; related to preeclampsia" (01/21/2016)     Home Medications Prior to Admission medications   Medication Sig Start Date End Date Taking? Authorizing Provider  acetaminophen  (TYLENOL ) 325 MG tablet Take 325 mg by mouth every 6 (six) hours as needed for mild pain (pain score 1-3) or headache.    [provider]  albuterol  (PROVENTIL  HFA;VENTOLIN  HFA) 108 (90 Base) MCG/ACT inhaler Inhale 2 puffs into the lungs every 4 (four) hours as needed for wheezing or shortness of breath. 05/02/16   Raymundo Calk, MD  ALPRAZolam  (XANAX ) 1 MG tablet Take 1 tablet (1  mg total) by mouth 3 (three) times daily as needed for anxiety. Take 30 minutes prior to MRI Patient taking differently: Take 1 mg by mouth 3 (three) times daily. Take 30 minutes prior to MRI 11/17/22   Persons, Norma Beckers, PA  amiodarone  (PACERONE ) 200 MG tablet Take 1 tablet (200 mg total) by mouth daily. 12/21/22 06/19/23  Clearnce Curia, NP  apixaban  (ELIQUIS ) 5 MG TABS tablet Take 1 tablet (5 mg total) by mouth 2 (two) times daily. 09/28/22   Clearnce Curia, NP  atorvastatin  (LIPITOR) 80 MG tablet Take 1 tablet (80 mg total) by mouth daily. 09/28/22   Walker, Caitlin S, NP  carboxymethylcellulose (REFRESH PLUS) 0.5 % SOLN Place 2 drops into both eyes daily.    [provider]  clopidogrel  (PLAVIX ) 75 MG tablet Take 1 tablet (75 mg total) by mouth daily  with breakfast. 09/17/22   Debria Fang, PA-C  cyclobenzaprine  (FLEXERIL ) 10 MG tablet Take 10 mg by mouth 2 (two) times daily as needed for muscle spasms.    [provider]  ferric citrate  (AURYXIA ) 1 GM 210 MG(Fe) tablet Take 2 tablets (420 mg total) by mouth 3 (three) times daily with meals. 05/10/23   Audria Leather, MD  fluticasone  furoate-vilanterol (BREO ELLIPTA ) 100-25 MCG/ACT AEPB Inhale 1 puff into the lungs daily. 04/30/22   Lind Repine, MD  folic acid -vitamin b complex-vitamin c-selenium-zinc (DIALYVITE) 3 MG TABS tablet Take 1 tablet by mouth daily.    [provider]  guaiFENesin -dextromethorphan (ROBITUSSIN DM) 100-10 MG/5ML syrup Take 10 mLs by mouth every 4 (four) hours as needed for cough. 05/10/23   Audria Leather, MD  lactulose  (CHRONULAC ) 10 GM/15ML solution Take 30 mLs by mouth daily as needed for mild constipation, moderate constipation or severe constipation. 09/04/21   [provider]  loperamide  (IMODIUM ) 1 MG/5ML solution Take 2 mg by mouth 4 (four) times daily as needed for diarrhea or loose stools.    [provider]  methylPREDNISolone  (MEDROL  DOSEPAK) 4 MG TBPK tablet Take as directed by medrol  dose pack 05/10/23   Audria Leather, MD  midodrine  (PROAMATINE ) 10 MG tablet Take 10 mg by mouth 3 (three) times daily as needed (Hypotension). 09/03/22   [provider]  NARCAN  4 MG/0.1ML LIQD nasal spray kit Place 1 spray into the nose as needed (accidental overdose). 11/14/18   [provider]  nystatin cream (MYCOSTATIN) Apply 1 Application topically daily. Apply to recurrent boils on buttocks    [provider]  ondansetron  (ZOFRAN -ODT) 8 MG disintegrating tablet Take 16 mg by mouth daily as needed for nausea or refractory nausea / vomiting.    [provider]  oxyCODONE -acetaminophen  (PERCOCET) 10-325 MG tablet Take 1 tablet by mouth every 6 (six) hours as needed. 04/06/23   [provider]   pantoprazole  (PROTONIX ) 40 MG tablet Take 1 tablet (40 mg total) by mouth daily. Patient taking differently: Take 20 mg by mouth daily. 09/28/22 10/14/23  Walker, Caitlin S, NP  sevelamer  carbonate (RENVELA ) 800 MG tablet Take 1,600 mg by mouth 3 (three) times daily with meals. 05/18/19   [provider]      Allergies    Iron  sucrose, Morphine , Prednisone, Tuna [fish allergy], Amlodipine, Iodinated contrast media, and Tape    Review of Systems   Review of Systems  Physical Exam Updated Vital Signs BP (!) 93/54   Pulse 62   Temp 99 F (37.2 C) (Oral)   Resp 15   Ht 5\' 4"  (  1.626 m)   Wt 68 kg   SpO2 99%   BMI 25.75 kg/m  Physical Exam  ED Results / Procedures / Treatments   Labs (all labs ordered are listed, but only abnormal results are displayed) Labs Reviewed  CBC - Abnormal; Notable for the following components:      Result Value   RDW 18.9 (*)    All other components within normal limits  TROPONIN T, HIGH SENSITIVITY - Abnormal; Notable for the following components:   Troponin T High Sensitivity 144 (*)    All other components within normal limits  PREGNANCY, URINE  COMPREHENSIVE METABOLIC PANEL WITH GFR    EKG EKG Interpretation Date/Time:  Tuesday August 24 2023 20:44:39 EDT Ventricular Rate:  66 PR Interval:  144 QRS Duration:  74 QT Interval:  460 QTC Calculation: 482 R Axis:   -23  Text Interpretation: Normal sinus rhythm Possible Anterior infarct , age undetermined Abnormal ECG When compared with ECG of 08-May-2023 18:27, PREVIOUS ECG IS PRESENT No significant change since last tracing Confirmed by Scarlette Currier ([id number]) on 08/24/2023 9:58:09 PM  Radiology DG Chest 2 View Result Date: 08/24/2023 CLINICAL DATA:  chest pain, sob EXAM: CHEST - 2 VIEW COMPARISON:  05/08/2023. FINDINGS: The heart size and mediastinal contours are within normal limits. Both lungs are clear. No pneumothorax or pleural effusion. There are thoracic degenerative changes.  IMPRESSION: No acute cardiopulmonary disease. Electronically Signed   By: Sydell Eva M.D.   On: 08/24/2023 21:16    Procedures Procedures  {Document cardiac monitor, telemetry assessment procedure when appropriate:1}  Medications Ordered in ED Medications - No data to display  ED Course/ Medical Decision Making/ A&P   {   Click here for ABCD2, HEART and other calculatorsREFRESH Note before signing :1}                              Medical Decision Making Amount and/or Complexity of Data Reviewed Labs: ordered. Radiology: ordered.   ***  {Document critical care time when appropriate:1} {Document review of labs and clinical decision tools ie heart score, Chads2Vasc2 etc:1}  {Document your independent review of radiology images, and any outside records:1} {Document your discussion with family members, caretakers, and with consultants:1} {Document social determinants of health affecting pt's care:1} {Document your decision making why or why not admission, treatments were needed:1} Final Clinical Impression(s) / ED Diagnoses Final diagnoses:  None    Rx / DC Orders ED Discharge Orders     None

## 2023-08-25 DIAGNOSIS — J9611 Chronic respiratory failure with hypoxia: Secondary | ICD-10-CM | POA: Diagnosis present

## 2023-08-25 DIAGNOSIS — I428 Other cardiomyopathies: Secondary | ICD-10-CM | POA: Diagnosis present

## 2023-08-25 DIAGNOSIS — Z87891 Personal history of nicotine dependence: Secondary | ICD-10-CM | POA: Diagnosis not present

## 2023-08-25 DIAGNOSIS — R12 Heartburn: Secondary | ICD-10-CM | POA: Diagnosis present

## 2023-08-25 DIAGNOSIS — N186 End stage renal disease: Secondary | ICD-10-CM | POA: Diagnosis present

## 2023-08-25 DIAGNOSIS — K219 Gastro-esophageal reflux disease without esophagitis: Secondary | ICD-10-CM | POA: Diagnosis present

## 2023-08-25 DIAGNOSIS — R1310 Dysphagia, unspecified: Secondary | ICD-10-CM | POA: Diagnosis present

## 2023-08-25 DIAGNOSIS — G40909 Epilepsy, unspecified, not intractable, without status epilepticus: Secondary | ICD-10-CM | POA: Diagnosis present

## 2023-08-25 DIAGNOSIS — Z91041 Radiographic dye allergy status: Secondary | ICD-10-CM | POA: Diagnosis not present

## 2023-08-25 DIAGNOSIS — R7989 Other specified abnormal findings of blood chemistry: Secondary | ICD-10-CM | POA: Diagnosis present

## 2023-08-25 DIAGNOSIS — K224 Dyskinesia of esophagus: Secondary | ICD-10-CM | POA: Diagnosis present

## 2023-08-25 DIAGNOSIS — Z833 Family history of diabetes mellitus: Secondary | ICD-10-CM | POA: Diagnosis not present

## 2023-08-25 DIAGNOSIS — I5022 Chronic systolic (congestive) heart failure: Secondary | ICD-10-CM | POA: Diagnosis present

## 2023-08-25 DIAGNOSIS — E8889 Other specified metabolic disorders: Secondary | ICD-10-CM | POA: Diagnosis present

## 2023-08-25 DIAGNOSIS — D631 Anemia in chronic kidney disease: Secondary | ICD-10-CM | POA: Diagnosis present

## 2023-08-25 DIAGNOSIS — R079 Chest pain, unspecified: Secondary | ICD-10-CM | POA: Diagnosis not present

## 2023-08-25 DIAGNOSIS — Z9981 Dependence on supplemental oxygen: Secondary | ICD-10-CM | POA: Diagnosis not present

## 2023-08-25 DIAGNOSIS — Z885 Allergy status to narcotic agent status: Secondary | ICD-10-CM | POA: Diagnosis not present

## 2023-08-25 DIAGNOSIS — I132 Hypertensive heart and chronic kidney disease with heart failure and with stage 5 chronic kidney disease, or end stage renal disease: Secondary | ICD-10-CM | POA: Diagnosis present

## 2023-08-25 DIAGNOSIS — Z8249 Family history of ischemic heart disease and other diseases of the circulatory system: Secondary | ICD-10-CM | POA: Diagnosis not present

## 2023-08-25 DIAGNOSIS — J449 Chronic obstructive pulmonary disease, unspecified: Secondary | ICD-10-CM | POA: Diagnosis present

## 2023-08-25 DIAGNOSIS — Z7901 Long term (current) use of anticoagulants: Secondary | ICD-10-CM | POA: Diagnosis not present

## 2023-08-25 DIAGNOSIS — I10 Essential (primary) hypertension: Secondary | ICD-10-CM

## 2023-08-25 DIAGNOSIS — I480 Paroxysmal atrial fibrillation: Secondary | ICD-10-CM | POA: Diagnosis present

## 2023-08-25 DIAGNOSIS — Z992 Dependence on renal dialysis: Secondary | ICD-10-CM | POA: Diagnosis not present

## 2023-08-25 DIAGNOSIS — Z79899 Other long term (current) drug therapy: Secondary | ICD-10-CM | POA: Diagnosis not present

## 2023-08-25 DIAGNOSIS — I2510 Atherosclerotic heart disease of native coronary artery without angina pectoris: Secondary | ICD-10-CM | POA: Diagnosis present

## 2023-08-25 LAB — TROPONIN T, HIGH SENSITIVITY: Troponin T High Sensitivity: 131 ng/L (ref <19)

## 2023-08-25 LAB — APTT: aPTT: 69 s — ABNORMAL HIGH (ref 24–36)

## 2023-08-25 LAB — CBC
HCT: 36 % (ref 36.0–46.0)
Hemoglobin: 11.3 g/dL — ABNORMAL LOW (ref 12.0–15.0)
MCH: 27.7 pg (ref 26.0–34.0)
MCHC: 31.4 g/dL (ref 30.0–36.0)
MCV: 88.2 fL (ref 80.0–100.0)
Platelets: 183 10*3/uL (ref 150–400)
RBC: 4.08 MIL/uL (ref 3.87–5.11)
RDW: 18.6 % — ABNORMAL HIGH (ref 11.5–15.5)
WBC: 5.4 10*3/uL (ref 4.0–10.5)
nRBC: 0 % (ref 0.0–0.2)

## 2023-08-25 LAB — HEPATITIS B SURFACE ANTIGEN: Hepatitis B Surface Ag: NONREACTIVE

## 2023-08-25 LAB — HEPARIN LEVEL (UNFRACTIONATED): Heparin Unfractionated: >1.1 [IU]/mL — ABNORMAL HIGH (ref 0.30–0.70)

## 2023-08-25 MED ORDER — PANTOPRAZOLE SODIUM 20 MG PO TBEC: 20.0000 mg | DELAYED_RELEASE_TABLET | Freq: Every day | ORAL | Status: DC

## 2023-08-25 MED ORDER — POLYVINYL ALCOHOL 1.4 % OP SOLN
2.0000 [drp] | Freq: Every day | OPHTHALMIC | Status: DC
  Administered 2023-08-26: 2 [drp] via OPHTHALMIC
  Filled 2023-08-25: qty 15

## 2023-08-25 MED ORDER — OXYCODONE HCL 5 MG PO TABS: 5.0000 mg | ORAL_TABLET | Freq: Four times a day (QID) | ORAL | Status: DC | PRN

## 2023-08-25 MED ORDER — ATORVASTATIN CALCIUM 80 MG PO TABS
80.0000 mg | ORAL_TABLET | Freq: Every day | ORAL | Status: DC
  Administered 2023-08-25 – 2023-08-27 (×3): 80 mg via ORAL
  Filled 2023-08-25 (×3): qty 1

## 2023-08-25 MED ORDER — PANTOPRAZOLE SODIUM 40 MG IV SOLR: 40.0000 mg | Freq: Two times a day (BID) | INTRAVENOUS | Status: DC

## 2023-08-25 MED ORDER — OXYCODONE-ACETAMINOPHEN 10-325 MG PO TABS: 1.0000 | ORAL_TABLET | Freq: Four times a day (QID) | ORAL | Status: DC | PRN

## 2023-08-25 MED ORDER — ACETAMINOPHEN 650 MG RE SUPP: 650.0000 mg | Freq: Four times a day (QID) | RECTAL | Status: DC | PRN

## 2023-08-25 MED ORDER — CHLORHEXIDINE GLUCONATE CLOTH 2 % EX PADS
6.0000 | MEDICATED_PAD | Freq: Every day | CUTANEOUS | Status: DC
  Administered 2023-08-26 – 2023-08-27 (×2): 6 via TOPICAL

## 2023-08-25 MED ORDER — FENTANYL CITRATE PF 50 MCG/ML IJ SOSY
12.5000 ug | PREFILLED_SYRINGE | Freq: Once | INTRAMUSCULAR | Status: AC
  Administered 2023-08-25: 12.5 ug via INTRAVENOUS
  Filled 2023-08-25: qty 1

## 2023-08-25 MED ORDER — CLOPIDOGREL BISULFATE 75 MG PO TABS
75.0000 mg | ORAL_TABLET | Freq: Every day | ORAL | Status: DC
  Administered 2023-08-26 – 2023-08-27 (×2): 75 mg via ORAL
  Filled 2023-08-25: qty 1

## 2023-08-25 MED ORDER — SODIUM CHLORIDE 0.9% FLUSH
3.0000 mL | Freq: Two times a day (BID) | INTRAVENOUS | Status: DC
  Administered 2023-08-25: 10 mL via INTRAVENOUS
  Administered 2023-08-25 – 2023-08-26 (×2): 3 mL via INTRAVENOUS

## 2023-08-25 MED ORDER — ACETAMINOPHEN 325 MG PO TABS: 325.0000 mg | ORAL_TABLET | Freq: Four times a day (QID) | ORAL | Status: DC | PRN

## 2023-08-25 MED ORDER — FAMOTIDINE 20 MG PO TABS
40.0000 mg | ORAL_TABLET | Freq: Once | ORAL | Status: AC
  Administered 2023-08-25: 40 mg via ORAL
  Filled 2023-08-25: qty 2

## 2023-08-25 MED ORDER — OXYCODONE-ACETAMINOPHEN 5-325 MG PO TABS
1.0000 | ORAL_TABLET | Freq: Four times a day (QID) | ORAL | Status: DC | PRN
  Administered 2023-08-25 – 2023-08-26 (×2): 1 via ORAL
  Filled 2023-08-25: qty 1

## 2023-08-25 MED ORDER — APIXABAN 5 MG PO TABS: 5.0000 mg | ORAL_TABLET | Freq: Two times a day (BID) | ORAL | Status: DC

## 2023-08-25 MED ORDER — SODIUM CHLORIDE 0.9% FLUSH: 3.0000 mL | INTRAVENOUS | Status: DC | PRN

## 2023-08-25 MED ORDER — SODIUM CHLORIDE 0.9% FLUSH
3.0000 mL | Freq: Two times a day (BID) | INTRAVENOUS | Status: DC
  Administered 2023-08-25 – 2023-08-26 (×3): 3 mL via INTRAVENOUS

## 2023-08-25 MED ORDER — SIMETHICONE 80 MG PO CHEW
80.0000 mg | CHEWABLE_TABLET | Freq: Once | ORAL | Status: AC
  Administered 2023-08-25: 80 mg via ORAL
  Filled 2023-08-25: qty 1

## 2023-08-25 MED ORDER — ALUM & MAG HYDROXIDE-SIMETH 200-200-20 MG/5ML PO SUSP
30.0000 mL | Freq: Once | ORAL | Status: AC
  Administered 2023-08-25: 30 mL via ORAL
  Filled 2023-08-25: qty 30

## 2023-08-25 MED ORDER — MIDODRINE HCL 5 MG PO TABS
10.0000 mg | ORAL_TABLET | Freq: Three times a day (TID) | ORAL | Status: DC
  Administered 2023-08-25 – 2023-08-26 (×4): 10 mg via ORAL
  Filled 2023-08-25 (×3): qty 2

## 2023-08-25 MED ORDER — FENTANYL CITRATE PF 50 MCG/ML IJ SOSY: 12.5000 ug | PREFILLED_SYRINGE | Freq: Four times a day (QID) | INTRAMUSCULAR | Status: DC | PRN

## 2023-08-25 MED ORDER — LIDOCAINE VISCOUS HCL 2 % MT SOLN
15.0000 mL | Freq: Once | OROMUCOSAL | Status: AC
  Administered 2023-08-25: 15 mL via ORAL
  Filled 2023-08-25: qty 15

## 2023-08-25 MED ORDER — FLUTICASONE FUROATE-VILANTEROL 100-25 MCG/ACT IN AEPB
1.0000 | INHALATION_SPRAY | Freq: Every day | RESPIRATORY_TRACT | Status: DC
  Administered 2023-08-27: 1 via RESPIRATORY_TRACT
  Filled 2023-08-25: qty 28

## 2023-08-25 MED ORDER — ACETAMINOPHEN 325 MG PO TABS: 650.0000 mg | ORAL_TABLET | Freq: Four times a day (QID) | ORAL | Status: DC | PRN

## 2023-08-25 MED ORDER — MIDODRINE HCL 5 MG PO TABS: 10.0000 mg | ORAL_TABLET | Freq: Three times a day (TID) | ORAL | Status: DC | PRN

## 2023-08-25 MED ORDER — ALBUTEROL (5 MG/ML) CONTINUOUS INHALATION SOLN: 2.5000 mg | INHALATION_SOLUTION | RESPIRATORY_TRACT | Status: DC | PRN

## 2023-08-25 MED ORDER — PANTOPRAZOLE SODIUM 40 MG IV SOLR
40.0000 mg | Freq: Two times a day (BID) | INTRAVENOUS | Status: DC
  Administered 2023-08-25 – 2023-08-26 (×3): 40 mg via INTRAVENOUS
  Filled 2023-08-25 (×3): qty 10

## 2023-08-25 MED ORDER — AMIODARONE HCL 200 MG PO TABS
200.0000 mg | ORAL_TABLET | Freq: Every day | ORAL | Status: DC
  Administered 2023-08-26 – 2023-08-27 (×2): 200 mg via ORAL
  Filled 2023-08-25 (×2): qty 1

## 2023-08-25 MED ORDER — SEVELAMER CARBONATE 800 MG PO TABS
1600.0000 mg | ORAL_TABLET | Freq: Three times a day (TID) | ORAL | Status: DC
  Administered 2023-08-25 – 2023-08-27 (×4): 1600 mg via ORAL
  Filled 2023-08-25 (×4): qty 2

## 2023-08-25 MED ORDER — ALPRAZOLAM 0.5 MG PO TABS
1.0000 mg | ORAL_TABLET | Freq: Three times a day (TID) | ORAL | Status: DC | PRN
  Administered 2023-08-25 – 2023-08-26 (×2): 1 mg via ORAL
  Filled 2023-08-25 (×2): qty 2

## 2023-08-25 NOTE — ED Notes (Signed)
 [facility] in dept preparing pt for transfer.

## 2023-08-25 NOTE — Consult Note (Signed)
 [facility] Nephrology Consult Note: Reason for Consult: To manage dialysis and dialysis related needs Referring Physician: Dr. Lydia Sams, Lind Repine  HPI:  Desiree [name] is an 48 y.o. female with past medical history significant for CAD, hypertension, anemia, ESRD on HD, on chronic home oxygen , CHF, presented with chest pain seen as a consultation for the evaluation of ESRD.  Patient with intermittent chest pain with mildly elevated troponin.  Initially hypertensive in the ER with improvement of blood pressure.  Requiring around 2 L of oxygen .  Already seen by cardiology team with consideration of cardiac cath.  Currently on IV heparin . The patient had fast dialysis yesterday and tolerated well.  Potassium 4.5 and rest of the labs consistent with ESRD.  Volume status acceptable. OP HD orders:  Dialyzes at  [facility], TTS, 3 hr 45 mins, EDW 69/3 kg, 2k 2ca. LUE AVF Mircera 75 mcg on 6/10.  Sensipar  60 mg tiw  Past Medical History:  Diagnosis Date   Anemia    Anxiety    panic attacks   Arthritis    bilateral knees   Childhood asthma    Complication of anesthesia    sometimes it does not work; didn't during LEEP OR (01/21/2016)   Depression    no med   ESRD (end stage renal disease) on dialysis (HCC)    TTS; [facility]; [facility] (08/03/2016)   GERD (gastroesophageal reflux disease)    nexium prn   Gout    History of blood transfusion    related to kidneys; I've had 4 (01/21/2016)   Hypertension    Hypertensive urgency 12/05/2021   Migraine    last one 01/18/19   Myocardial infarction [facility]) 08/2022   Preterm labor ~ 2014   Retina hole, left    Seizures (HCC)    last one was in 2000; related to preeclampsia (01/21/2016)    Past Surgical History:  Procedure Laterality Date   A/V FISTULAGRAM Left 12/19/2021   Procedure: A/V Fistulagram;  Surgeon: Dannis Dy, MD;  Location: [facility] INVASIVE CV LAB;  Service: Cardiovascular;  Laterality: Left;   A/V  FISTULAGRAM N/A 04/02/2023   Procedure: A/V Fistulagram;  Surgeon: Melodie Spry, MD;  Location: [facility] INVASIVE CV LAB;  Service: Cardiovascular;  Laterality: N/A;   AV FISTULA PLACEMENT Left 2010   AV FISTULA PLACEMENT Left 01/20/2019   Procedure: ARTERIOVENOUS (AV) FISTULA CREATION LEFT UPPER ARM;  Surgeon: Dannis Dy, MD;  Location: [facility] OR;  Service: Vascular;  Laterality: Left;   AV FISTULA PLACEMENT Left 12/19/2021   Procedure: ARTERIOVENOUS (AV) FISTULA CREATION POSSIBLE TUNNELED DIALYSIS;  Surgeon: Adine Hoof, MD;  Location: [facility] OR;  Service: Vascular;  Laterality: Left;   CERVICAL BIOPSY  W/ LOOP ELECTRODE EXCISION  2001   CORONARY/GRAFT ACUTE MI REVASCULARIZATION N/A 09/12/2022   Procedure: Coronary/Graft Acute MI Revascularization;  Surgeon: Lucendia Rusk, MD;  Location: [facility] INVASIVE CV LAB;  Service: Cardiovascular;  Laterality: N/A;   DILATION AND EVACUATION  08/02/2011   Procedure: DILATATION AND EVACUATION;  Surgeon: Adrianna Albee, MD;  Location: [facility] ORS;  Service: Gynecology;;   DILATION AND EVACUATION N/A 08/31/2013   Procedure: DILATATION AND EVACUATION;  Surgeon: Tresia Fruit, MD;  Location: [facility] ORS;  Service: Gynecology;  Laterality: N/A;   FISTULA SUPERFICIALIZATION Left 01/20/2019   Procedure: Fistula Superficialization;  Surgeon: Dannis Dy, MD;  Location: [facility] OR;  Service: Vascular;  Laterality: Left;   HYDRADENITIS EXCISION Right    INSERTION OF DIALYSIS CATHETER  Right 12/19/2021   Procedure: INSERTION OF A TUNNELED DIALYSIS CATHETER;  Surgeon: Adine Hoof, MD;  Location: [facility] OR;  Service: Vascular;  Laterality: Right;   LEFT HEART CATH AND CORONARY ANGIOGRAPHY N/A 09/12/2022   Procedure: LEFT HEART CATH AND CORONARY ANGIOGRAPHY;  Surgeon: Lucendia Rusk, MD;  Location: [facility] INVASIVE CV LAB;  Service: Cardiovascular;  Laterality: N/A;   RENAL BIOPSY     REVISION OF ARTERIOVENOUS GORETEX GRAFT Left 02/11/2013    Procedure: REVISION OF ARTERIOVENOUS GORTEX FISTULA;  Surgeon: Mayo Speck, MD;  Location: [facility] OR;  Service: Vascular;  Laterality: Left;   THROMBECTOMY W/ EMBOLECTOMY Left 05/25/2018   Procedure: REPAIR OF BLEEDING ARTERIOVENOUS FISTULA;  Surgeon: Margherita Shell, MD;  Location: [facility] OR;  Service: Vascular;  Laterality: Left;    Family History  Problem Relation Age of Onset   Diabetes Mother    Hyperlipidemia Mother    Hypertension Mother    Heart disease Mother    Hypertension Other    Diabetes type II Other     Social History:  reports that she has quit smoking. Her smoking use included cigarettes. She has been exposed to tobacco smoke. She has never used smokeless tobacco. She reports current drug use. Drug: Marijuana. She reports that she does not drink alcohol.  Allergies:  Allergies  Allergen Reactions   Iron  Sucrose Itching    Other Reaction(s): C/O of Being Hot, headache   Morphine  Shortness Of Breath and Anaphylaxis   Prednisone Other (See Comments)    Other reaction(s): Other (See Comments) Muscle spasms Patient says prednisone causes her to cramp all over, muscle spasms uncontrolled   Tuna [Fish Allergy] Itching, Swelling, Rash and Other (See Comments)    Face droops also   Amlodipine Other (See Comments)    Angioedema (09/05/17 ED visit)   Iodinated Contrast Media Itching   Tape Itching    Adhesive tape   paper tape ok    Medications: I have reviewed the patient's current medications.   Results for orders placed or performed during the hospital encounter of 08/24/23 (from the past 48 hours)  CBC     Status: Abnormal   Collection Time: 08/24/23  8:59 PM  Result Value Ref Range   WBC 5.7 4.0 - 10.5 K/uL   RBC 4.65 3.87 - 5.11 MIL/uL   Hemoglobin 12.8 12.0 - 15.0 g/dL   HCT 16.1 09.6 - 04.5 %   MCV 88.2 80.0 - 100.0 fL   MCH 27.5 26.0 - 34.0 pg   MCHC 31.2 30.0 - 36.0 g/dL   RDW 40.9 (H) 81.1 - 91.4 %   Platelets 204 150 - 400 K/uL   nRBC 0.0 0.0 - 0.2 %     Comment: Performed at [facility], [address], [place], [place] [address]  Troponin T, High Sensitivity     Status: Abnormal   Collection Time: 08/24/23  8:59 PM  Result Value Ref Range   Troponin T High Sensitivity 144 (HH) <19 ng/L    Comment: Critical Value, Read Back and verified with Frederica Jakob, RN 2150 08/24/23 EO (NOTE) Biotin concentrations > 1000 ng/mL falsely decrease TnT results.  Serial cardiac troponin measurements are suggested.  Refer to the Links section for chest pain algorithms and additional  guidance. Performed at [facility], [address] [place], [place] [address]   Comprehensive metabolic panel with GFR     Status: Abnormal   Collection Time: 08/24/23  8:59 PM  Result Value  Ref Range   Sodium 140 135 - 145 mmol/L   Potassium 4.5 3.5 - 5.1 mmol/L   Chloride 93 (L) 98 - 111 mmol/L   CO2 28 22 - 32 mmol/L   Glucose, Bld 88 70 - 99 mg/dL    Comment: Glucose reference range applies only to samples taken after fasting for at least 8 hours.   BUN 32 (H) 6 - 20 mg/dL   Creatinine, Ser 1.61 (H) 0.44 - 1.00 mg/dL   Calcium  9.7 8.9 - 10.3 mg/dL   Total Protein 9.1 (H) 6.5 - 8.1 g/dL   Albumin  4.5 3.5 - 5.0 g/dL   AST 29 15 - 41 U/L   ALT 35 0 - 44 U/L   Alkaline Phosphatase 226 (H) 38 - 126 U/L   Total Bilirubin 0.4 0.0 - 1.2 mg/dL   GFR, Estimated 6 (L) >60 mL/min    Comment: (NOTE) Calculated using the CKD-EPI Creatinine Equation (2021)    Anion gap 19 (H) 5 - 15    Comment: Performed at [facility], [address], [place], [place] [address]  Troponin T, High Sensitivity     Status: Abnormal   Collection Time: 08/24/23 10:46 PM  Result Value Ref Range   Troponin T High Sensitivity 131 (HH) <19 ng/L    Comment: Critical value noted. Value is consistent with previously reported and called value  (NOTE) Biotin concentrations > 1000 ng/mL falsely decrease TnT results.  Serial cardiac  troponin measurements are suggested.  Refer to the Links section for chest pain algorithms and additional  guidance. Performed at [facility], [address] [place], [place] [address]   Heparin  level (unfractionated)     Status: Abnormal   Collection Time: 08/25/23  6:02 AM  Result Value Ref Range   Heparin  Unfractionated >1.10 (H) 0.30 - 0.70 IU/mL    Comment: (NOTE) The clinical reportable range upper limit is being lowered to >1.10 to align with the FDA approved guidance for the current laboratory assay.  If heparin  results are below expected values, and patient dosage has  been confirmed, suggest follow up testing of antithrombin III levels. Performed at [facility], [address] [place], [place] [address]   APTT     Status: Abnormal   Collection Time: 08/25/23  6:02 AM  Result Value Ref Range   aPTT 69 (H) 24 - 36 seconds    Comment:        IF BASELINE aPTT IS ELEVATED, SUGGEST PATIENT RISK ASSESSMENT BE USED TO DETERMINE APPROPRIATE ANTICOAGULANT THERAPY. Performed at [facility], [address] [place], [place] [address]   CBC     Status: Abnormal   Collection Time: 08/25/23  6:02 AM  Result Value Ref Range   WBC 5.4 4.0 - 10.5 K/uL   RBC 4.08 3.87 - 5.11 MIL/uL   Hemoglobin 11.3 (L) 12.0 - 15.0 g/dL   HCT 62.1 30.8 - 65.7 %   MCV 88.2 80.0 - 100.0 fL   MCH 27.7 26.0 - 34.0 pg   MCHC 31.4 30.0 - 36.0 g/dL   RDW 84.6 (H) 96.2 - 95.2 %   Platelets 183 150 - 400 K/uL   nRBC 0.0 0.0 - 0.2 %    Comment: Performed at [facility], [address], [place], [place] [address]    DG Chest 2 View Result Date: 08/24/2023 CLINICAL DATA:  chest pain, sob EXAM: CHEST - 2 VIEW COMPARISON:  05/08/2023. FINDINGS: The heart size and  mediastinal contours are within normal limits. Both lungs are clear. No pneumothorax or pleural effusion. There are thoracic degenerative changes. IMPRESSION: No acute  cardiopulmonary disease. Electronically Signed   By: Sydell Eva M.D.   On: 08/24/2023 21:16    ROS: As per H&P with ongoing chest pain.  Review of systems reviewed and negative. Blood pressure 130/65, pulse (!) 55, temperature 98.3 F (36.8 C), temperature source Oral, resp. rate 16, height 5' 6 (1.676 m), weight 71 kg, SpO2 100%. 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   Assessment/Plan:  # Chest pain/CAD/unstable angina: Seen by cardiology team.  Currently on heparin  with consideration of cardiac cath.  # ESRD: TTS, labs and volume status acceptable.  Plan for regular dialysis tomorrow.  Need to coordinate with any cardiac procedure.  # Hypertension/volume: Initial blood pressure was low now much better.  Hold antihypertensives.  UF with HD.  # Anemia of ESRD: Hemoglobin at goal, erythropoietin recently dosed as outpatient.  # Metabolic Bone Disease: Monitor labs and resume binders.  Thank you for the consult, we will continue to follow.  England Greb Lansing Planas 08/25/2023, 2:27 PM

## 2023-08-25 NOTE — Consult Note (Addendum)
 Cardiology Consultation   Patient ID: Desiree [name] MRN: [id number]; DOB: 08/20/1975  Admit date: 08/24/2023 Date of Consult: 08/25/2023  PCP:  Delbert Feathers, MD   [facility] Providers Cardiologist:  Sheryle Donning, MD        Patient Profile: Desiree [name] is a 48 y.o. female with a hx of ESRD (HD T/TH/S), hypertension, seizure, migraines, chronic respiratory failure on 3L O2, CAD s/p thrombotic anterior STEMI 09/12/22 treated with angioplasty, atrial fibrillation, chronic systolic heart failure who is being seen 08/25/2023 for the evaluation of elevated troponin at the request of Dr. Lydia Sams.  History of Present Illness: Desiree [name] has cardiac history notable for STEMI 09/12/22. Patient presented with crushing chest pain associated with nausea and vomiting. EKG with EMS showed anterior ST elevation. Urgent LHC with Dr. Jacquelynn Matter found suspected thromboembolic event to coronary arteries, attributed to previously undetected afib. Occlusion of left distal OM 3 and apical LAD noted. Angioplasty attempted but there was minimal flow improvement. TTE at that time with LVEF 35-40%, calcified mobile MAC with no evidence of endocarditis. Patient started on Eliquis  and Plavix  during that admission. Patient then readmitted 10/01/22-10/03/22 with recurrent chest pain. Echo showed LVEF 45-50%, no RWMA, moderate pericardial effusion but no tamponade. Admitted 11/03/22-11/05/22 with palpitations, EKG showed afib with RVR. Patient treated with IV amiodarone , transitioned to PO. Patient's last visit with cardiology was 03/02/23 when she saw Dr. Veryl Gottron in clinic. Overall was doing well at that time, reported fluctuant BP with HD. Amiodarone  and Eliquis  were continued. No HF GDMT added due to hypotension.  Patient presented to [facility] ED on 6/10 with symptoms of chest pain.  Yesterday after dialysis patient reports feeling more poorly than usual with low blood pressure.  At 1 point she got up  from laying down at home and fell to the floor with described tunnel vision and near syncope.  Later yesterday, patient developed increasing chest discomfort.  Pain described as a substernal pressure radiating to the back.  She reports feeling as if she swallowed food and it got stuck.  Patient describes similar pain last year with her MI.  Denies symptoms of recurrent A-fib, confirms compliance with daily amiodarone  and twice daily Eliquis .  At [facility], patient received Maalox and notes that this did improve her symptoms significantly.  Discomfort has since recurred.  She reports lower BP over the last month both on dialysis days and nondialysis days.  Her midodrine  has been increased to daily dosing.  In the ED, troponin checked and found minimally elevated 144->131. Given her cardiac history, patient started on heparin  and transferred to [facility] under [facility] hospitalist care.    Past Medical History:  Diagnosis Date   Anemia    Anxiety    panic attacks   Arthritis    bilateral knees   Childhood asthma    Complication of anesthesia    sometimes it does not work; didn't during LEEP OR (01/21/2016)   Depression    no med   ESRD (end stage renal disease) on dialysis (HCC)    TTS; [facility]; [facility] (08/03/2016)   GERD (gastroesophageal reflux disease)    nexium prn   Gout    History of blood transfusion    related to kidneys; I've had 4 (01/21/2016)   Hypertension    Hypertensive urgency 12/05/2021   Migraine    last one 01/18/19   Myocardial infarction [facility]) 08/2022   Preterm labor ~ 2014   Retina hole, left    Seizures (  HCC)    last one was in 2000; related to preeclampsia (01/21/2016)    Past Surgical History:  Procedure Laterality Date   A/V FISTULAGRAM Left 12/19/2021   Procedure: A/V Fistulagram;  Surgeon: Dannis Dy, MD;  Location: [facility] INVASIVE CV LAB;  Service: Cardiovascular;  Laterality: Left;   A/V FISTULAGRAM N/A 04/02/2023   Procedure: A/V  Fistulagram;  Surgeon: Melodie Spry, MD;  Location: [facility] INVASIVE CV LAB;  Service: Cardiovascular;  Laterality: N/A;   AV FISTULA PLACEMENT Left 2010   AV FISTULA PLACEMENT Left 01/20/2019   Procedure: ARTERIOVENOUS (AV) FISTULA CREATION LEFT UPPER ARM;  Surgeon: Dannis Dy, MD;  Location[facility] OR;  Service: Vascular;  Laterality: Left;   AV FISTULA PLACEMENT Left 12/19/2021   Procedure: ARTERIOVENOUS (AV) FISTULA CREATION POSSIBLE TUNNELED DIALYSIS;  Surgeon: Adine Hoof, MD;  Location: [facility] OR;  Service: Vascular;  Laterality: Left;   CERVICAL BIOPSY  W/ LOOP ELECTRODE EXCISION  2001   CORONARY/GRAFT ACUTE MI REVASCULARIZATION N/A 09/12/2022   Procedure: Coronary/Graft Acute MI Revascularization;  Surgeon: Lucendia Rusk, MD;  Location: [facility] INVASIVE CV LAB;  Service: Cardiovascular;  Laterality: N/A;   DILATION AND EVACUATION  08/02/2011   Procedure: DILATATION AND EVACUATION;  Surgeon: Adrianna Albee, MD;  Location: [facility] ORS;  Service: Gynecology;;   DILATION AND EVACUATION N/A 08/31/2013   Procedure: DILATATION AND EVACUATION;  Surgeon: Tresia Fruit, MD;  Location: [facility] ORS;  Service: Gynecology;  Laterality: N/A;   FISTULA SUPERFICIALIZATION Left 01/20/2019   Procedure: Fistula Superficialization;  Surgeon: Dannis Dy, MD;  Location: [facility] OR;  Service: Vascular;  Laterality: Left;   HYDRADENITIS EXCISION Right    INSERTION OF DIALYSIS CATHETER Right 12/19/2021   Procedure: INSERTION OF A TUNNELED DIALYSIS CATHETER;  Surgeon: Adine Hoof, MD;  Location: [facility] OR;  Service: Vascular;  Laterality: Right;   LEFT HEART CATH AND CORONARY ANGIOGRAPHY N/A 09/12/2022   Procedure: LEFT HEART CATH AND CORONARY ANGIOGRAPHY;  Surgeon: Lucendia Rusk, MD;  Location: [facility] INVASIVE CV LAB;  Service: Cardiovascular;  Laterality: N/A;   RENAL BIOPSY     REVISION OF ARTERIOVENOUS GORETEX GRAFT Left 02/11/2013   Procedure: REVISION OF ARTERIOVENOUS GORTEX FISTULA;   Surgeon: Mayo Speck, MD;  Location: [facility] OR;  Service: Vascular;  Laterality: Left;   THROMBECTOMY W/ EMBOLECTOMY Left 05/25/2018   Procedure: REPAIR OF BLEEDING ARTERIOVENOUS FISTULA;  Surgeon: Margherita Shell, MD;  Location: [facility] OR;  Service: Vascular;  Laterality: Left;     Home Medications:  Prior to Admission medications   Medication Sig Start Date End Date Taking? Authorizing Provider  acetaminophen  (TYLENOL ) 325 MG tablet Take 325 mg by mouth every 6 (six) hours as needed for mild pain (pain score 1-3) or headache.    [provider]  albuterol  (PROVENTIL  HFA;VENTOLIN  HFA) 108 (90 Base) MCG/ACT inhaler Inhale 2 puffs into the lungs every 4 (four) hours as needed for wheezing or shortness of breath. 05/02/16   Raymundo Calk, MD  ALPRAZolam  (XANAX ) 1 MG tablet Take 1 tablet (1 mg total) by mouth 3 (three) times daily as needed for anxiety. Take 30 minutes prior to MRI Patient taking differently: Take 1 mg by mouth 3 (three) times daily. Take 30 minutes prior to MRI 11/17/22   Persons, Norma Beckers, PA  amiodarone  (PACERONE ) 200 MG tablet Take 1 tablet (200 mg total) by mouth daily. 12/21/22 06/19/23  Clearnce Curia, NP  apixaban  (ELIQUIS ) 5 MG TABS tablet Take 1  tablet (5 mg total) by mouth 2 (two) times daily. 09/28/22   Clearnce Curia, NP  atorvastatin  (LIPITOR) 80 MG tablet Take 1 tablet (80 mg total) by mouth daily. 09/28/22   Walker, Caitlin S, NP  carboxymethylcellulose (REFRESH PLUS) 0.5 % SOLN Place 2 drops into both eyes daily.    [provider]  clopidogrel  (PLAVIX ) 75 MG tablet Take 1 tablet (75 mg total) by mouth daily with breakfast. 09/17/22   Debria Fang, PA-C  cyclobenzaprine  (FLEXERIL ) 10 MG tablet Take 10 mg by mouth 2 (two) times daily as needed for muscle spasms.    [provider]  ferric citrate  (AURYXIA ) 1 GM 210 MG(Fe) tablet Take 2 tablets (420 mg total) by mouth 3 (three) times daily with meals. 05/10/23   Audria Leather, MD  fluticasone   furoate-vilanterol (BREO ELLIPTA ) 100-25 MCG/ACT AEPB Inhale 1 puff into the lungs daily. 04/30/22   Lind Repine, MD  folic acid -vitamin b complex-vitamin c-selenium-zinc (DIALYVITE) 3 MG TABS tablet Take 1 tablet by mouth daily.    [provider]  guaiFENesin -dextromethorphan (ROBITUSSIN DM) 100-10 MG/5ML syrup Take 10 mLs by mouth every 4 (four) hours as needed for cough. 05/10/23   Audria Leather, MD  lactulose  (CHRONULAC ) 10 GM/15ML solution Take 30 mLs by mouth daily as needed for mild constipation, moderate constipation or severe constipation. 09/04/21   [provider]  loperamide  (IMODIUM ) 1 MG/5ML solution Take 2 mg by mouth 4 (four) times daily as needed for diarrhea or loose stools.    [provider]  methylPREDNISolone  (MEDROL  DOSEPAK) 4 MG TBPK tablet Take as directed by medrol  dose pack 05/10/23   Audria Leather, MD  midodrine  (PROAMATINE ) 10 MG tablet Take 10 mg by mouth 3 (three) times daily as needed (Hypotension). 09/03/22   [provider]  NARCAN  4 MG/0.1ML LIQD nasal spray kit Place 1 spray into the nose as needed (accidental overdose). 11/14/18   [provider]  nystatin cream (MYCOSTATIN) Apply 1 Application topically daily. Apply to recurrent boils on buttocks    [provider]  ondansetron  (ZOFRAN -ODT) 8 MG disintegrating tablet Take 16 mg by mouth daily as needed for nausea or refractory nausea / vomiting.    [provider]  oxyCODONE -acetaminophen  (PERCOCET) 10-325 MG tablet Take 1 tablet by mouth every 6 (six) hours as needed. 04/06/23   [provider]  pantoprazole  (PROTONIX ) 40 MG tablet Take 1 tablet (40 mg total) by mouth daily. Patient taking differently: Take 20 mg by mouth daily. 09/28/22 10/14/23  Walker, Caitlin S, NP  sevelamer  carbonate (RENVELA ) 800 MG tablet Take 1,600 mg by mouth 3 (three) times daily with meals. 05/18/19   [provider]    Scheduled Meds:  alum & mag  hydroxide-simeth  30 mL Oral Once   [START ON 08/26/2023] artificial tears  2 drop Both Eyes Daily   atorvastatin   80 mg Oral Daily   [START ON 08/26/2023] Chlorhexidine  Gluconate Cloth  6 each Topical Q0600   [START ON 08/26/2023] clopidogrel   75 mg Oral Q breakfast   [START ON 08/26/2023] fluticasone  furoate-vilanterol  1 puff Inhalation Daily   midodrine   10 mg Oral TID WC   [START ON 08/26/2023] pantoprazole   20 mg Oral Daily   sevelamer  carbonate  1,600 mg Oral TID WC   sodium chloride  flush  3 mL Intravenous Q12H   sodium chloride  flush  3-10 mL Intravenous Q12H   Continuous Infusions:  heparin  850 Units/hr (08/25/23 1400)   PRN Meds:  acetaminophen  **OR** acetaminophen , fentaNYL  (SUBLIMAZE ) injection, midodrine , oxyCODONE -acetaminophen  **AND** oxyCODONE , sodium chloride  flush  Allergies:    Allergies  Allergen Reactions   Iron  Sucrose Itching    Other Reaction(s): C/O of Being Hot, headache   Morphine  Shortness Of Breath and Anaphylaxis   Prednisone Other (See Comments)    Other reaction(s): Other (See Comments) Muscle spasms Patient says prednisone causes her to cramp all over, muscle spasms uncontrolled   Tuna [Fish Allergy] Itching, Swelling, Rash and Other (See Comments)    Face droops also   Amlodipine Other (See Comments)    Angioedema (09/05/17 ED visit)   Iodinated Contrast Media Itching   Tape Itching    Adhesive tape   paper tape ok    Social History:   Social History   Socioeconomic History   Marital status: Media planner    Spouse name: Not on file   Number of children: Not on file   Years of education: Not on file   Highest education level: Not on file  Occupational History   Not on file  Tobacco Use   Smoking status: Former    Types: Cigarettes    Passive exposure: Yes   Smokeless tobacco: Never   Tobacco comments:    Husband and daughter Smokes on patio  Vaping Use   Vaping status: Never Used  Substance and Sexual Activity   Alcohol use: No    Drug use: Yes    Types: Marijuana    Comment: not recent   Sexual activity: Yes    Birth control/protection: None  Other Topics Concern   Not on file  Social History Narrative   Not on file   Social Drivers of Health   Financial Resource Strain: Not on file  Food Insecurity: No Food Insecurity (08/25/2023)   Hunger Vital Sign    Worried About Running Out of Food in the Last Year: Never true    Ran Out of Food in the Last Year: Never true  Transportation Needs: No Transportation Needs (08/25/2023)   PRAPARE - Administrator, Civil Service (Medical): No    Lack of Transportation (Non-Medical): No  Physical Activity: Not on file  Stress: Not on file  Social Connections: Unknown (08/25/2023)   Social Connection and Isolation Panel [NHANES]    Frequency of Communication with Friends and Family: Three times a week    Frequency of Social Gatherings with Friends and Family: Three times a week    Attends Religious Services: Patient declined    Active Member of Clubs or Organizations: Patient declined    Attends Banker Meetings: Patient declined    Marital Status: Living with partner  Intimate Partner Violence: Not At Risk (08/25/2023)   Humiliation, Afraid, Rape, and Kick questionnaire    Fear of Current or Ex-Partner: No    Emotionally Abused: No    Physically Abused: No    Sexually Abused: No    Family History:    Family History  Problem Relation Age of Onset   Diabetes Mother    Hyperlipidemia Mother    Hypertension Mother    Heart disease Mother    Hypertension Other    Diabetes type II Other      ROS:  Please see the history of present illness.   All other ROS reviewed and negative.     Physical Exam/Data: Vitals:   08/25/23 1112 08/25/23 1130 08/25/23 1200 08/25/23 1335  BP:  123/61 (!) 99/53 130/65  Pulse:  (!) 58 (!)  57 (!) 55  Resp:  (!) 21 20 16   Temp: 98.3 F (36.8 C)   98.3 F (36.8 C)  TempSrc: Oral   Oral  SpO2:  100% 100%  100%  Weight:    71 kg  Height:    5' 6 (1.676 m)    Intake/Output Summary (Last 24 hours) at 08/25/2023 1503 Last data filed at 08/25/2023 1400 Gross per 24 hour  Intake 123.02 ml  Output --  Net 123.02 ml      08/25/2023    1:35 PM 08/24/2023    8:44 PM 05/10/2023    5:00 AM  Last 3 Weights  Weight (lbs) 156 lb 8.4 oz 150 lb 147 lb 11.3 oz  Weight (kg) 71 kg 68.04 kg 67 kg     Body mass index is 25.26 kg/m.  General:  Well nourished, well developed, in no acute distress HEENT: normal Neck: no JVD Vascular: No carotid bruits; Distal pulses 2+ bilaterally Cardiac:  normal S1, S2; HR in the 50s, regular; no murmur  Lungs:  clear to auscultation bilaterally, no wheezing, rhonchi or rales  Abd: soft, nontender, no hepatomegaly  Ext: no edema Musculoskeletal:  No deformities, BUE and BLE strength normal and equal Skin: warm and dry  Neuro:  CNs 2-12 intact, no focal abnormalities noted Psych:  Normal affect   EKG:  The EKG was personally reviewed and demonstrates: Sinus rhythm with no acute ischemic changes Telemetry:  Telemetry was personally reviewed and demonstrates: A mix of sinus rhythm and sinus bradycardia with heart rate in the 50s and 60s  Relevant CV Studies:  09/13/22 LHC    Lat 3rd Mrg lesion is 100% stenosed.  Balloon angioplasty was performed using a BALLN EMERGE MR 2.0X12.   Post intervention, there is a 0% residual stenosis.   Dist LAD lesion is 100% stenosed.  Balloon angioplasty was performed using a BALLN EMERGE MR 2.0X12.   Post intervention, there is a 100% residual stenosis.   Mid Cx lesion is 50% stenosed.  Thrombotic.   LV end diastolic pressure is moderately elevated.   Recommend to resume Apixaban , at currently prescribed dose and frequency.   Recommend concurrent antiplatelet therapy of Clopidogrel  75mg  daily for 12 months.   No atherosclerotic coronary artery disease.  The occlusions appear to be from embolization of thrombus down the left main  reaching the apical LAD and the distal OM3.  Angioplasty done in both areas but neither area receiving TIMI-3 flow.    11/04/22 TTE  IMPRESSIONS     1. Left ventricular ejection fraction, by estimation, is 45 to 50%. The  left ventricle has mildly decreased function. The left ventricle  demonstrates regional wall motion abnormalities (see scoring  diagram/findings for description). Left ventricular  diastolic function could not be evaluated.   2. Right ventricular systolic function is mildly reduced. The right  ventricular size is normal. Tricuspid regurgitation signal is inadequate  for assessing PA pressure.   3. A small pericardial effusion is present. The pericardial effusion is  posterior to the left ventricle. There is no evidence of cardiac  tamponade.   4. The mitral valve is degenerative. Mild mitral valve regurgitation. No  evidence of mitral stenosis. Moderate mitral annular calcification.   5. Tricuspid valve regurgitation is mild to moderate.   6. The aortic valve is tricuspid. Aortic valve regurgitation is not  visualized. Aortic valve sclerosis is present, with no evidence of aortic  valve stenosis.   7. The inferior vena cava is  normal in size with <50% respiratory  variability, suggesting right atrial pressure of 8 mmHg.   Comparison(s): No significant change from prior study.   FINDINGS   Left Ventricle: Left ventricular ejection fraction, by estimation, is 45  to 50%. The left ventricle has mildly decreased function. The left  ventricle demonstrates regional wall motion abnormalities. The left  ventricular internal cavity size was normal  in size. There is no left ventricular hypertrophy. Left ventricular  diastolic function could not be evaluated due to atrial fibrillation. Left  ventricular diastolic function could not be evaluated.     LV Wall Scoring:  The apex is hypokinetic.   Right Ventricle: The right ventricular size is normal. No increase in   right ventricular wall thickness. Right ventricular systolic function is  mildly reduced. Tricuspid regurgitation signal is inadequate for assessing  PA pressure.   Left Atrium: Left atrial size was normal in size.   Right Atrium: Right atrial size was normal in size.   Pericardium: A small pericardial effusion is present. The pericardial  effusion is posterior to the left ventricle. There is no evidence of  cardiac tamponade.   Mitral Valve: The mitral valve is degenerative in appearance. Moderate  mitral annular calcification. Mild mitral valve regurgitation. No evidence  of mitral valve stenosis.   Tricuspid Valve: The tricuspid valve is grossly normal. Tricuspid valve  regurgitation is mild to moderate. No evidence of tricuspid stenosis.   Aortic Valve: The aortic valve is tricuspid. Aortic valve regurgitation is  not visualized. Aortic valve sclerosis is present, with no evidence of  aortic valve stenosis.   Pulmonic Valve: The pulmonic valve was grossly normal. Pulmonic valve  regurgitation is not visualized. No evidence of pulmonic stenosis.   Aorta: The aortic root and ascending aorta are structurally normal, with  no evidence of dilitation.   Venous: The inferior vena cava is normal in size with less than 50%  respiratory variability, suggesting right atrial pressure of 8 mmHg.   IAS/Shunts: The atrial septum is grossly normal.   Laboratory Data: High Sensitivity Troponin:  No results for input(s): TROPONINIHS in the last 720 hours.   Chemistry Recent Labs  Lab 08/24/23 2059  NA 140  K 4.5  CL 93*  CO2 28  GLUCOSE 88  BUN 32*  CREATININE 7.80*  CALCIUM  9.7  GFRNONAA 6*  ANIONGAP 19*    Recent Labs  Lab 08/24/23 2059  PROT 9.1*  ALBUMIN  4.5  AST 29  ALT 35  ALKPHOS 226*  BILITOT 0.4   Lipids No results for input(s): CHOL, TRIG, HDL, LABVLDL, LDLCALC, CHOLHDL in the last 168 hours.  Hematology Recent Labs  Lab 08/24/23 2059  08/25/23 0602  WBC 5.7 5.4  RBC 4.65 4.08  HGB 12.8 11.3*  HCT 41.0 36.0  MCV 88.2 88.2  MCH 27.5 27.7  MCHC 31.2 31.4  RDW 18.9* 18.6*  PLT 204 183   Thyroid No results for input(s): TSH, FREET4 in the last 168 hours.  BNPNo results for input(s): BNP, PROBNP in the last 168 hours.  DDimer No results for input(s): DDIMER in the last 168 hours.  Radiology/Studies:  DG Chest 2 View Result Date: 08/24/2023 CLINICAL DATA:  chest pain, sob EXAM: CHEST - 2 VIEW COMPARISON:  05/08/2023. FINDINGS: The heart size and mediastinal contours are within normal limits. Both lungs are clear. No pneumothorax or pleural effusion. There are thoracic degenerative changes. IMPRESSION: No acute cardiopulmonary disease. Electronically Signed   By: Judithann Novas.D.  On: 08/24/2023 21:16     Assessment and Plan:  Elevated troponin Hx CAD As above, patient with anterior STEMI in 2024 from embolization of thrombus in apical LAD and OM3. S/P angioplasty had no return of flow in distal LAD and slow flow in OM3. Has been maintained on Eliquis  since. Now admitted with central chest pain described as feeling the same as MI pain. Troponin 144->131. ECG without evidence of acute ischemia.  Clinical picture is mixed with symptoms reported as similar to those felt last year in the setting of her MI.  However troponin elevation is minimal with decreasing trend.  Additionally her EKG is without acute ischemia.  Furthermore, symptoms at least temporarily have been relieved by antacids.  Not clear ACS at this point.  Favor continuing heparin  with plan for echocardiogram.  If patient has similar LV function with no new regional wall motion abnormalities, may not warrant ischemic evaluation, more likely type II MI/demand ischemia.  However if echocardiogram notably different in comparison to November 2024 study, would certainly pursue inpatient ischemic evaluation.  HFmrEF, NYHA class I TTE following anterior  STEMI June 2024 showed LVEF 35-40%, calcified mobile MAC with no evidence of endocarditis. Repeat study in August with slightly improved EF 45-50%. GDMT has been limited by her ESRD and chronic hypotension.  Patient not reporting symptoms consistent with CHF prior to her admission.  Has not required an increase in oxygen . Clinically patient without symptoms of hypervolemia. Guideline directed medical therapy remains limited by blood pressure and ESRD status. Repeat echocardiogram ordered  Paroxysmal afib with RVR Prior arterial embolism of coronary arteries Patient diagnosed with afib following 2024 STEMI, felt secondary to arterial embolism. Has since been maintained on Amiodarone . November 2024 Zio without evidence of recurrent afib.  Continue home Amiodarone   Bridge with heparin  given potential need for inpatient ischemic evaluation.  Resume Eliquis  prior to discharge.  Hypotension Patient reports chronically low blood pressure, worse than baseline, over the past month.  She is now taking midodrine  10 mg as frequently as 3 times daily. Patient's hypotension is not a contraindication to daily amiodarone  200 mg. Echocardiogram ordered as above.  Per primary team: ESRD Chronic respiratory failure  Risk Assessment/Risk Scores:    TIMI Risk Score for Unstable Angina or Non-ST Elevation MI:   The patient's TIMI risk score is 3, which indicates a 13% risk of all cause mortality, new or recurrent myocardial infarction or need for urgent revascularization in the next 14 days.  New [place]  Heart Association (NYHA) Functional Class NYHA Class I  CHA2DS2-VASc Score = 4   This indicates a 4.8% annual risk of stroke. The patient's score is based upon: CHF History: 1 HTN History: 1 Diabetes History: 0 Stroke History: 0 Vascular Disease History: 1 Age Score: 0 Gender Score: 1        For questions or updates, please contact [facility] Please consult [email or web address] for contact  info under    Signed, Leala Prince, PA-C  08/25/2023 3:03 PM    ATTENDING ATTESTATION:  After conducting a review of all available clinical information with the care team, interviewing the patient, and performing a physical exam, I agree with the findings and plan described in this note.   GEN: No acute distress.   HEENT:  MMM, no JVD, no scleral icterus Cardiac: RRR, no murmurs, rubs, or gallops.  Respiratory: Clear to auscultation bilaterally. GI: Soft, nontender, non-distended  MS: No edema; No deformity. Neuro:  Nonfocal  Vasc:  +2 radial pulses  The patient is a 48 year old female with a history of STEMI with multivessel thromboembolism of the obtuse marginal and distal LAD thought to be due to undiagnosed atrial fibrillation treated with balloon angioplasty of both vessels in June 2024, end-stage renal disease, and chronic systolic heart failure who was seen due to chest discomfort.  The patient tells me that when she swallows her chest hurts.  This sometimes radiates to her back.  She received the Maalox with an improvement in her symptoms.  Her troponins are minimally elevated.  Her EKG demonstrates no acute ischemic changes.  Of note her PPI was recently decreased in dose from 40 mg to 20 mg.  Given her atypical symptoms we will defer coronary angiography at this time.  Will obtain an echocardiogram.  If this is significantly abnormal with high risk features we may consider coronary angiography.  Otherwise we will continue to treat with Maalox and consider increasing pantoprazole  back to 40 mg.  I suspect a GI etiology given her constellation of symptoms.  Alyssa Backbone, MD Pager [phone]

## 2023-08-25 NOTE — Progress Notes (Signed)
 Mobility Specialist Progress Note:    08/25/23 1547  Mobility  Activity Ambulated with assistance to bathroom  Level of Assistance Standby assist, set-up cues, supervision of patient - no hands on  Assistive Device None  Distance Ambulated (ft) 12 ft  Activity Response Tolerated well  Mobility Referral Yes  Mobility visit 1 Mobility  Mobility Specialist Start Time (ACUTE ONLY) 1540  Mobility Specialist Stop Time (ACUTE ONLY) 1545  Mobility Specialist Time Calculation (min) (ACUTE ONLY) 5 min   Pt requested assistance to bathroom. No AD required. Tolerated well, asx throughout. Left in bathroom with all needs met, encouraged to use call bell for assistance.   Jasmyne Lodato Mobility Specialist Please contact via Special educational needs teacher or  Rehab office at [phone]

## 2023-08-25 NOTE — Plan of Care (Signed)
 Patient doing well.  Hep gtts running with no complications.

## 2023-08-25 NOTE — H&P (Addendum)
 History and Physical    Patient: Desiree [name]:[id number] DOB: 09-26-1975 DOA: 08/24/2023 DOS: the patient was seen and examined on 08/25/2023 PCP: Delbert Feathers, MD  Patient coming from: [facility]. Chief complaint: Chief Complaint  Patient presents with   Chest Pain   Shortness of Breath  HPI:  Desiree [name] is a 48 y.o. female with past medical history  of  ESRD on HD T/TH/S patient does not make any urine, hypertension, seizures, migraines, chronic respiratory failure on 3 L home O2, CAD s/p Underwent LHC 09/13/2022 with finding suspected be thromboembolic event to coronaries, likely from previously undetected atrial fibrillation. Due to occlusion of left distal OM 3 and apical LAD. Angioplasty done to both areas but difficult to restore significant flow. Echocardiogram LVEF 35-40%. It also revealed calcified mobile MAC with no clinical signs of endocarditis. She was discharged on Eliquis , Plavix . , atrial fibrillation presents to the ER with complaint of chest pain that has been radiating to her back, and also has been having low blood pressures recently.  Chest pain has been intermittent, Patient was seen at [facility] today for complaints of central chest pain for about 1 week and shortness of breath.  ED Course: Pt in ed at bedside  is alert awake oriented. Vital signs in the ED were notable for the following: Weight today is 68 kg, BMI of 25.75.   Vitals:   08/25/23 1112 08/25/23 1130 08/25/23 1200 08/25/23 1335  BP:  123/61 (!) 99/53 130/65  Pulse:  (!) 58 (!) 57 (!) 55  Temp: 98.3 F (36.8 C)   98.3 F (36.8 C)  Resp:  (!) 21 20 16   Height:    5' 6 (1.676 m)  Weight:    71 kg  SpO2:  100% 100% 100%  TempSrc: Oral   Oral  BMI (Calculated):    25.28  >>ED evaluation thus far shows: Initial EKG sinus rhythm at 66 with a PR of 144 QTc of 482.  Possible anterior infarct per reading.  Initial chest x-ray was negative for any acute cardiopulmonary abnormality. CMP showing 3  BUN of 32, creatinine of 7.8, anion gap of 19-26. Initial troponin of 144 repeat TNI of 131.  CBC is within normal limits.  >>While in the ED patient received the following: Medications  heparin  ADULT infusion 100 units/mL (25000 units/250mL) (850 Units/hr Intravenous New Bag/Given 08/24/23 2313)  midodrine  (PROAMATINE ) tablet 10 mg (10 mg Oral Given 08/25/23 1236)  midodrine  (PROAMATINE ) tablet 10 mg (10 mg Oral Given 08/24/23 2245)  aspirin  chewable tablet 324 mg (324 mg Oral Given 08/24/23 2245)  fentaNYL  (SUBLIMAZE ) injection 12.5 mcg (12.5 mcg Intravenous Given 08/24/23 2245)  fentaNYL  (SUBLIMAZE ) injection 12.5 mcg (12.5 mcg Intravenous Given 08/25/23 0013)  alum & mag hydroxide-simeth (MAALOX/MYLANTA) 200-200-20 MG/5ML suspension 30 mL (30 mLs Oral Given 08/25/23 0326)    And  lidocaine  (XYLOCAINE ) 2 % viscous mouth solution 15 mL (15 mLs Oral Given 08/25/23 0326)  famotidine  (PEPCID ) tablet 40 mg (40 mg Oral Given 08/25/23 0835)  fentaNYL  (SUBLIMAZE ) injection 12.5 mcg (12.5 mcg Intravenous Given 08/25/23 0836)   Review of Systems  Respiratory:  Positive for shortness of breath.   Cardiovascular:  Positive for chest pain.  All other systems reviewed and are negative.  Past Medical History:  Diagnosis Date   Anemia    Anxiety    panic attacks   Arthritis    bilateral knees   Childhood asthma    Complication of anesthesia  sometimes it does not work; didn't during LEEP OR (01/21/2016)   Depression    no med   ESRD (end stage renal disease) on dialysis (HCC)    TTS; [facility]; [facility] (08/03/2016)   GERD (gastroesophageal reflux disease)    nexium prn   Gout    History of blood transfusion    related to kidneys; I've had 4 (01/21/2016)   Hypertension    Hypertensive urgency 12/05/2021   Migraine    last one 01/18/19   Myocardial infarction [facility]) 08/2022   Preterm labor ~ 2014   Retina hole, left    Seizures (HCC)    last one was in 2000; related to  preeclampsia (01/21/2016)   Past Surgical History:  Procedure Laterality Date   A/V FISTULAGRAM Left 12/19/2021   Procedure: A/V Fistulagram;  Surgeon: Dannis Dy, MD;  Location: [facility] INVASIVE CV LAB;  Service: Cardiovascular;  Laterality: Left;   A/V FISTULAGRAM N/A 04/02/2023   Procedure: A/V Fistulagram;  Surgeon: Melodie Spry, MD;  Location: [facility] INVASIVE CV LAB;  Service: Cardiovascular;  Laterality: N/A;   AV FISTULA PLACEMENT Left 2010   AV FISTULA PLACEMENT Left 01/20/2019   Procedure: ARTERIOVENOUS (AV) FISTULA CREATION LEFT UPPER ARM;  Surgeon: Dannis Dy, MD;  Location: [facility] OR;  Service: Vascular;  Laterality: Left;   AV FISTULA PLACEMENT Left 12/19/2021   Procedure: ARTERIOVENOUS (AV) FISTULA CREATION POSSIBLE TUNNELED DIALYSIS;  Surgeon: Adine Hoof, MD;  Location: [facility] OR;  Service: Vascular;  Laterality: Left;   CERVICAL BIOPSY  W/ LOOP ELECTRODE EXCISION  2001   CORONARY/GRAFT ACUTE MI REVASCULARIZATION N/A 09/12/2022   Procedure: Coronary/Graft Acute MI Revascularization;  Surgeon: Lucendia Rusk, MD;  Location: [facility] INVASIVE CV LAB;  Service: Cardiovascular;  Laterality: N/A;   DILATION AND EVACUATION  08/02/2011   Procedure: DILATATION AND EVACUATION;  Surgeon: Adrianna Albee, MD;  Location: [facility] ORS;  Service: Gynecology;;   DILATION AND EVACUATION N/A 08/31/2013   Procedure: DILATATION AND EVACUATION;  Surgeon: Tresia Fruit, MD;  Location: [facility] ORS;  Service: Gynecology;  Laterality: N/A;   FISTULA SUPERFICIALIZATION Left 01/20/2019   Procedure: Fistula Superficialization;  Surgeon: Dannis Dy, MD;  Location: [facility] OR;  Service: Vascular;  Laterality: Left;   HYDRADENITIS EXCISION Right    INSERTION OF DIALYSIS CATHETER Right 12/19/2021   Procedure: INSERTION OF A TUNNELED DIALYSIS CATHETER;  Surgeon: Adine Hoof, MD;  Location: [facility] OR;  Service: Vascular;  Laterality: Right;   LEFT HEART CATH AND CORONARY ANGIOGRAPHY N/A  09/12/2022   Procedure: LEFT HEART CATH AND CORONARY ANGIOGRAPHY;  Surgeon: Lucendia Rusk, MD;  Location: [facility] INVASIVE CV LAB;  Service: Cardiovascular;  Laterality: N/A;   RENAL BIOPSY     REVISION OF ARTERIOVENOUS GORETEX GRAFT Left 02/11/2013   Procedure: REVISION OF ARTERIOVENOUS GORTEX FISTULA;  Surgeon: Mayo Speck, MD;  Location: [facility] OR;  Service: Vascular;  Laterality: Left;   THROMBECTOMY W/ EMBOLECTOMY Left 05/25/2018   Procedure: REPAIR OF BLEEDING ARTERIOVENOUS FISTULA;  Surgeon: Margherita Shell, MD;  Location: [facility] OR;  Service: Vascular;  Laterality: Left;    reports that she has quit smoking. Her smoking use included cigarettes. She has been exposed to tobacco smoke. She has never used smokeless tobacco. She reports current drug use. Drug: Marijuana. She reports that she does not drink alcohol. Allergies  Allergen Reactions   Iron  Sucrose Itching    Other Reaction(s): C/O of Being Hot, headache   Morphine  Shortness Of Breath  and Anaphylaxis   Prednisone Other (See Comments)    Other reaction(s): Other (See Comments) Muscle spasms Patient says prednisone causes her to cramp all over, muscle spasms uncontrolled   Tuna [Fish Allergy] Itching, Swelling, Rash and Other (See Comments)    Face droops also   Amlodipine Other (See Comments)    Angioedema (09/05/17 ED visit)   Iodinated Contrast Media Itching   Tape Itching    Adhesive tape   paper tape ok   Family History  Problem Relation Age of Onset   Diabetes Mother    Hyperlipidemia Mother    Hypertension Mother    Heart disease Mother    Hypertension Other    Diabetes type II Other    Prior to Admission medications   Medication Sig Start Date End Date Taking? Authorizing Provider  acetaminophen  (TYLENOL ) 325 MG tablet Take 325 mg by mouth every 6 (six) hours as needed for mild pain (pain score 1-3) or headache.    [provider]  albuterol  (PROVENTIL  HFA;VENTOLIN  HFA) 108 (90 Base) MCG/ACT inhaler Inhale  2 puffs into the lungs every 4 (four) hours as needed for wheezing or shortness of breath. 05/02/16   Liu, Dana Duo, MD  ALPRAZolam  (XANAX ) 1 MG tablet Take 1 tablet (1 mg total) by mouth 3 (three) times daily as needed for anxiety. Take 30 minutes prior to MRI Patient taking differently: Take 1 mg by mouth 3 (three) times daily. Take 30 minutes prior to MRI 11/17/22   Persons, Norma Beckers, PA  amiodarone  (PACERONE ) 200 MG tablet Take 1 tablet (200 mg total) by mouth daily. 12/21/22 06/19/23  Clearnce Curia, NP  apixaban  (ELIQUIS ) 5 MG TABS tablet Take 1 tablet (5 mg total) by mouth 2 (two) times daily. 09/28/22   Clearnce Curia, NP  atorvastatin  (LIPITOR) 80 MG tablet Take 1 tablet (80 mg total) by mouth daily. 09/28/22   Walker, Caitlin S, NP  carboxymethylcellulose (REFRESH PLUS) 0.5 % SOLN Place 2 drops into both eyes daily.    [provider]  clopidogrel  (PLAVIX ) 75 MG tablet Take 1 tablet (75 mg total) by mouth daily with breakfast. 09/17/22   Debria Fang, PA-C  cyclobenzaprine  (FLEXERIL ) 10 MG tablet Take 10 mg by mouth 2 (two) times daily as needed for muscle spasms.    [provider]  ferric citrate  (AURYXIA ) 1 GM 210 MG(Fe) tablet Take 2 tablets (420 mg total) by mouth 3 (three) times daily with meals. 05/10/23   Audria Leather, MD  fluticasone  furoate-vilanterol (BREO ELLIPTA ) 100-25 MCG/ACT AEPB Inhale 1 puff into the lungs daily. 04/30/22   Lind Repine, MD  folic acid -vitamin b complex-vitamin c-selenium-zinc (DIALYVITE) 3 MG TABS tablet Take 1 tablet by mouth daily.    [provider]  guaiFENesin -dextromethorphan (ROBITUSSIN DM) 100-10 MG/5ML syrup Take 10 mLs by mouth every 4 (four) hours as needed for cough. 05/10/23   Audria Leather, MD  lactulose  (CHRONULAC ) 10 GM/15ML solution Take 30 mLs by mouth daily as needed for mild constipation, moderate constipation or severe constipation. 09/04/21   [provider]  loperamide  (IMODIUM ) 1 MG/5ML  solution Take 2 mg by mouth 4 (four) times daily as needed for diarrhea or loose stools.    [provider]  methylPREDNISolone  (MEDROL  DOSEPAK) 4 MG TBPK tablet Take as directed by medrol  dose pack 05/10/23   Audria Leather, MD  midodrine  (PROAMATINE ) 10 MG tablet Take 10 mg by mouth 3 (three) times daily as needed (Hypotension). 09/03/22  [provider]  NARCAN  4 MG/0.1ML LIQD nasal spray kit Place 1 spray into the nose as needed (accidental overdose). 11/14/18   [provider]  nystatin cream (MYCOSTATIN) Apply 1 Application topically daily. Apply to recurrent boils on buttocks    [provider]  ondansetron  (ZOFRAN -ODT) 8 MG disintegrating tablet Take 16 mg by mouth daily as needed for nausea or refractory nausea / vomiting.    [provider]  oxyCODONE -acetaminophen  (PERCOCET) 10-325 MG tablet Take 1 tablet by mouth every 6 (six) hours as needed. 04/06/23   [provider]  pantoprazole  (PROTONIX ) 40 MG tablet Take 1 tablet (40 mg total) by mouth daily. Patient taking differently: Take 20 mg by mouth daily. 09/28/22 10/14/23  Walker, Caitlin S, NP  sevelamer  carbonate (RENVELA ) 800 MG tablet Take 1,600 mg by mouth 3 (three) times daily with meals. 05/18/19   [provider]                                                                                 Vitals:   08/25/23 1112 08/25/23 1130 08/25/23 1200 08/25/23 1335  BP:  123/61 (!) 99/53 130/65  Pulse:  (!) 58 (!) 57 (!) 55  Resp:  (!) 21 20 16   Temp: 98.3 F (36.8 C)   98.3 F (36.8 C)  TempSrc: Oral   Oral  SpO2:  100% 100% 100%  Weight:    71 kg  Height:    5' 6 (1.676 m)   Physical Exam Vitals and nursing note reviewed.  Constitutional:      General: She is not in acute distress. HENT:     Head: Normocephalic and atraumatic.     Right Ear: Hearing normal.     Left Ear: Hearing normal.     Nose: No nasal deformity.     Mouth/Throat:     Lips: Pink.  Eyes:      General: Lids are normal.     Extraocular Movements: Extraocular movements intact.  Cardiovascular:     Rate and Rhythm: Normal rate and regular rhythm.     Heart sounds: Normal heart sounds.  Pulmonary:     Effort: Pulmonary effort is normal.     Breath sounds: Normal breath sounds.  Abdominal:     General: Bowel sounds are normal. There is no distension.     Palpations: Abdomen is soft. There is no mass.     Tenderness: There is no abdominal tenderness.  Musculoskeletal:       Arms:     Right lower leg: No edema.     Left lower leg: No edema.     Comments: Chest pain. NR. NO nausea NO vomiting.  NON tender . NO mass.    Skin:    General: Skin is warm.  Neurological:     General: No focal deficit present.     Mental Status: She is alert and oriented to person, place, and time.     Cranial Nerves: Cranial nerves 2-12 are intact.  Psychiatric:        Speech: Speech normal.     Labs on Admission: I have personally reviewed following labs and imaging studies CBC: Recent Labs  Lab 08/24/23 2059 08/25/23 0602  WBC 5.7 5.4  HGB 12.8 11.3*  HCT 41.0 36.0  MCV 88.2 88.2  PLT 204 183   Basic Metabolic Panel: Recent Labs  Lab 08/24/23 2059  NA 140  K 4.5  CL 93*  CO2 28  GLUCOSE 88  BUN 32*  CREATININE 7.80*  CALCIUM  9.7   GFR: Estimated Creatinine Clearance: 8.3 mL/min (A) (by C-G formula based on SCr of 7.8 mg/dL (H)). Liver Function Tests: Recent Labs  Lab 08/24/23 2059  AST 29  ALT 35  ALKPHOS 226*  BILITOT 0.4  PROT 9.1*  ALBUMIN  4.5   No results for input(s): LIPASE, AMYLASE in the last 168 hours. No results for input(s): AMMONIA in the last 168 hours. Coagulation Profile: No results for input(s): INR, PROTIME in the last 168 hours. Cardiac Enzymes: No results for input(s): CKTOTAL, CKMB, CKMBINDEX, TROPONINI in the last 168 hours. BNP (last 3 results) No results for input(s): PROBNP in the last 8760 hours. HbA1C: No  results for input(s): HGBA1C in the last 72 hours. CBG: No results for input(s): GLUCAP in the last 168 hours. Lipid Profile: No results for input(s): CHOL, HDL, LDLCALC, TRIG, CHOLHDL, LDLDIRECT in the last 72 hours. Thyroid Function Tests: No results for input(s): TSH, T4TOTAL, FREET4, T3FREE, THYROIDAB in the last 72 hours. Anemia Panel: No results for input(s): VITAMINB12, FOLATE, FERRITIN, TIBC, IRON , RETICCTPCT in the last 72 hours. Urine analysis:    Component Value Date/Time   COLORURINE YELLOW 12/20/2013 0530   APPEARANCEUR CLOUDY (A) 12/20/2013 0530   LABSPEC 1.009 12/20/2013 0530   PHURINE 8.0 12/20/2013 0530   GLUCOSEU 100 (A) 12/20/2013 0530   HGBUR LARGE (A) 12/20/2013 0530   BILIRUBINUR NEGATIVE 12/20/2013 0530   KETONESUR NEGATIVE 12/20/2013 0530   PROTEINUR 100 (A) 12/20/2013 0530   UROBILINOGEN 0.2 12/20/2013 0530   NITRITE NEGATIVE 12/20/2013 0530   LEUKOCYTESUR TRACE (A) 12/20/2013 0530   Radiological Exams on Admission: DG Chest 2 View Result Date: 08/24/2023 CLINICAL DATA:  chest pain, sob EXAM: CHEST - 2 VIEW COMPARISON:  05/08/2023. FINDINGS: The heart size and mediastinal contours are within normal limits. Both lungs are clear. No pneumothorax or pleural effusion. There are thoracic degenerative changes. IMPRESSION: No acute cardiopulmonary disease. Electronically Signed   By: Sydell Eva M.D.   On: 08/24/2023 21:16   Data Reviewed: Relevant notes from primary care and specialist visits, past discharge summaries as available in EHR, including Care Everywhere . Prior diagnostic testing as pertinent to current admission diagnoses, Updated medications and problem lists for reconciliation .ED course, including vitals, labs, imaging, treatment and response to treatment,Triage notes, nursing and pharmacy notes and ED provider's notes.Notable results as noted in HPI.Discussed case with EDMD/ ED APP/ or Specialty MD on call and  as needed.  Assessment & Plan  >>Chest pain/ NSTEMI with mildly elevated troponin that are down trending: Pt presenting with midsternal chest pain and started on heparin  gtt for nstemi.  We will consult cardiology. She will also needs cte chest. Pt does not make any urine. Have requested cardiology consult. EKG neg for any st elevation. Echo per cardiology.  Pt was on eliquis  and is not likely to have a PE pt has a negative CTA chest in 2024 February. . And have changed to heparin  gtt.    >> PAF Patient  currently on Eliquis  and amiodarone .additional changes per cardiology.    >> End-stage renal disease on Tuesdays Thursdays and Saturdays open nephrology consult. Nephrology consulted and  home meds continued. Cont sevelamer , continue Cinacalcet . Pt states she does not make any urine and has been on HD for 15 years.   >>Seizure disorder: Seizure / aspiration precaution.  No antiepileptics in chart. Per pharmacy last note said pt had seizure in 2000 from eclampsia.  >>C/H respiratory failure: Pt is on oxygen . Reviewed note not sure etiology of respiratory failure. No PE in previous CTA,  SpO2: 100 % O2 Flow Rate (L/min): 2 L/min We will continue pt on 2 L [place].      DVT prophylaxis:  Heparin  gtt.  Consults:  Cardiology. Nephrology.   Advance Care Planning:    Code Status: Full Code   Family Communication:  None . Disposition Plan:  Home. Severity of Illness: The appropriate patient status for this patient is INPATIENT. Inpatient status is judged to be reasonable and necessary in order to provide the required intensity of service to ensure the patient's safety. The patient's presenting symptoms, physical exam findings, and initial radiographic and laboratory data in the context of their chronic comorbidities is felt to place them at high risk for further clinical deterioration. Furthermore, it is not anticipated that the patient will be medically stable for discharge from the  hospital within 2 midnights of admission.   * I certify that at the point of admission it is my clinical judgment that the patient will require inpatient hospital care spanning beyond 2 midnights from the point of admission due to high intensity of service, high risk for further deterioration and high frequency of surveillance required.*  Unresulted Labs (From admission, onward)     Start     Ordered   08/26/23 0500  APTT  Daily,   R        08/25/23 0945   08/26/23 0500  Heparin  level (unfractionated)  Daily,   R        08/25/23 0945   08/26/23 0500  Comprehensive metabolic panel  Tomorrow morning,   R        08/25/23 1424   08/26/23 0500  CBC  Tomorrow morning,   R        08/25/23 1424   08/25/23 1432  Hepatitis B surface antigen  (New Admission Hemo Labs (Hepatitis B))  Once,   R        08/25/23 1432   08/25/23 1432  Hepatitis B surface antibody,quantitative  (New Admission Hemo Labs (Hepatitis B))  Once,   R        08/25/23 1432   08/25/23 1413  Rapid urine drug screen (hospital performed)  Add-on,   AD        08/25/23 1412   Signed and Held  Renal function panel  Once,   R        Signed and Held   Signed and Held  CBC  Once,   R        Signed and Held            Meds ordered this encounter  Medications   midodrine  (PROAMATINE ) tablet 10 mg   aspirin  chewable tablet 324 mg   fentaNYL  (SUBLIMAZE ) injection 12.5 mcg   heparin  ADULT infusion 100 units/mL (25000 units/250mL)   fentaNYL  (SUBLIMAZE ) injection 12.5 mcg   midodrine  (PROAMATINE ) tablet 10 mg   AND Linked Order Group    alum & mag hydroxide-simeth (MAALOX/MYLANTA) 200-200-20 MG/5ML suspension 30 mL    lidocaine  (XYLOCAINE ) 2 % viscous mouth solution 15 mL   famotidine  (PEPCID ) tablet 40 mg   fentaNYL  (  SUBLIMAZE ) injection 12.5 mcg   fentaNYL  (SUBLIMAZE ) injection 12.5 mcg   DISCONTD: pantoprazole  (PROTONIX ) injection 40 mg   DISCONTD: acetaminophen  (TYLENOL ) tablet 325 mg   DISCONTD: apixaban  (ELIQUIS ) tablet  5 mg   atorvastatin  (LIPITOR) tablet 80 mg   artificial tears ophthalmic solution 2 drop   clopidogrel  (PLAVIX ) tablet 75 mg   fluticasone  furoate-vilanterol (BREO ELLIPTA ) 100-25 MCG/ACT 1 puff   DISCONTD: oxyCODONE -acetaminophen  (PERCOCET) 10-325 MG per tablet 1 tablet    Refill:  0   sevelamer  carbonate (RENVELA ) tablet 1,600 mg   DISCONTD: pantoprazole  (PROTONIX ) EC tablet 20 mg   midodrine  (PROAMATINE ) tablet 10 mg   sodium chloride  flush (NS) 0.9 % injection 3 mL   OR Linked Order Group    acetaminophen  (TYLENOL ) tablet 650 mg    acetaminophen  (TYLENOL ) suppository 650 mg   sodium chloride  flush (NS) 0.9 % injection 3-10 mL   sodium chloride  flush (NS) 0.9 % injection 3-10 mL   Chlorhexidine  Gluconate Cloth 2 % PADS 6 each   AND Linked Order Group    oxyCODONE -acetaminophen  (PERCOCET/ROXICET) 5-325 MG per tablet 1 tablet     Refill:  0    oxyCODONE  (Oxy IR/ROXICODONE ) immediate release tablet 5 mg     Refill:  0   alum & mag hydroxide-simeth (MAALOX/MYLANTA) 200-200-20 MG/5ML suspension 30 mL   albuterol  (VENTOLIN  HFA) 108 (90 Base) MCG/ACT inhaler 2 puff   ALPRAZolam  (XANAX ) tablet 1 mg    Take 30 minutes prior to MRI     pantoprazole  (PROTONIX ) injection 40 mg     Orders Placed This Encounter  Procedures   DG Chest 2 View   CBC   Comprehensive metabolic panel with GFR   Heparin  level (unfractionated)   APTT   CBC   APTT   Heparin  level (unfractionated)   Rapid urine drug screen (hospital performed)   Comprehensive metabolic panel   CBC   Hepatitis B surface antigen   Hepatitis B surface antibody,quantitative   Diet renal with fluid restriction Fluid restriction: 2000 mL Fluid; Room service appropriate? Yes; Fluid consistency: Thin   Document Height and Actual Weight   Cardiac Monitoring - Continuous Indefinite   Maintain IV access   Vital signs   Notify physician (specify)   Mobility Protocol: No Restrictions RN to initiate protocols based on patient's level  of care   Refer to Sidebar Report Refer to ICU, Med-Surg, Progressive, and Step-Down Mobility Protocol Sidebars   Initiate Adult Central Line Maintenance and Catheter Protocol for patients with central line (CVC, PICC, Port, Hemodialysis, Trialysis)   Daily weights   Intake and Output   Do not place and if present remove PureWick   Initiate Oral Care Protocol   Initiate Carrier Fluid Protocol   RN may order General Admission PRN Orders utilizing General Admission PRN medications (through manage orders) for the following patient needs: allergy symptoms (Claritin), cold sores (Carmex), cough (Robitussin DM), eye irritation (Liquifilm Tears), hemorrhoids (Tucks), indigestion (Maalox), minor skin irritation (Hydrocortisone Cream), muscle pain Lovena Rubinstein Gay), nose irritation (saline nasal spray) and sore throat (Chloraseptic spray).   Cardiac Monitoring - Continuous Indefinite   Pre-Hemodialysis Protocol - Day of Dialysis   Post-Dialysis Protocol - Day of Dialysis   Full code   heparin  per pharmacy consult   Inpatient consult to Cardiology   Consult to hospitalist   Inpatient consult to Cardiology Consult Timeframe: ROUTINE - requires response within 24 hours; Reason for Consult? NSTEMI.   Consult to nephrology Consult Timeframe: ROUTINE - requires  response within 24 hours; Reason for Consult? ESRD on HD   Pulse oximetry check with vital signs   Oxygen  therapy Mode or (Route): Nasal cannula; Liters Per Minute: 2; Keep O2 saturation between: greater than 92 %   EKG 12-Lead   EKG   EKG   EKG   ECHOCARDIOGRAM COMPLETE   Hemodialysis inpatient   Place in observation (patient's expected length of stay will be less than 2 midnights)   Admit to Inpatient (patient's expected length of stay will be greater than 2 midnights or inpatient only procedure)   Aspiration precautions   Fall precautions    Author: Lavanda Porter, MD 12 pm- 8 pm. [facility]. 08/25/2023 3:33 PM >>Please note for any  concern,or critical results after hours past 8pm please contact the [facility] floor coverage provider from 7 PM- 7 AM. For on call review [email or web address], username [email or web address] and PW: your phone number<<

## 2023-08-25 NOTE — Progress Notes (Signed)
 PHARMACY - ANTICOAGULATION CONSULT NOTE  Pharmacy Consult for Heparin  dosing  Indication: chest pain/ACS  Patient Measurements: Height: 5' 4 (162.6 cm) Weight: 68 kg (150 lb) IBW/kg (Calculated) : 54.7 HEPARIN  DW (KG): 68  Vital Signs: Temp: 98.5 F (36.9 C) (06/11 0615) Temp Source: Oral (06/11 0615) BP: 105/77 (06/11 0615) Pulse Rate: 59 (06/11 0615)  Labs: Recent Labs    08/24/23 2059 08/25/23 0602  HGB 12.8 11.3*  HCT 41.0 36.0  PLT 204 183  APTT  --  69*  CREATININE 7.80*  --     Estimated Creatinine Clearance: 8.4 mL/min (A) (by C-G formula based on SCr of 7.8 mg/dL (H)).   Medical History: Past Medical History:  Diagnosis Date   Anemia    Anxiety    panic attacks   Arthritis    bilateral knees   Childhood asthma    Complication of anesthesia    sometimes it does not work; didn't during LEEP OR (01/21/2016)   Depression    no med   ESRD (end stage renal disease) on dialysis (HCC)    TTS; [facility]; [facility] (08/03/2016)   GERD (gastroesophageal reflux disease)    nexium prn   Gout    History of blood transfusion    related to kidneys; I've had 4 (01/21/2016)   Hypertension    Hypertensive urgency 12/05/2021   Migraine    last one 01/18/19   Myocardial infarction [facility]) 08/2022   Preterm labor ~ 2014   Retina hole, left    Seizures (HCC)    last one was in 2000; related to preeclampsia (01/21/2016)    Assessment: 48 YOF presented with CP and SOB. PMH of ESRD on HD, CAD, STEMI 08/2022 s/p angioplasty to OM and LAD. Patient on PTA Apixaban  for atrial fibrillation, with last dose 6/10 at ~1100 am today. Will require aPTT monitoring due to likely falsely high anti-Xa level secondary to DOAC use. Anticipating the anti-Xa level will be artifically elevated due to recent DOAC dose, will use aPTT to adjust heparin  infusion until correlating.   AM: apTT within goal on 850 units/hr. No signs/symptoms of bleeding reported.  Goal of Therapy:   Heparin  level 0.3-0.7 units/ml aPTT 66-102 seconds Monitor platelets by anticoagulation protocol: Yes   Plan:  Continue heparin  infusion at 850 units/hr Check confirmatory aPTT level in 6 hours and daily while on heparin  Continue to monitor via aPTT until levels are correlated Continue to monitor H&H and platelets  Young Hensen, PharmD, BCPS Clinical Pharmacist 08/25/2023 6:48 AM

## 2023-08-26 ENCOUNTER — Inpatient Hospital Stay (HOSPITAL_COMMUNITY)

## 2023-08-26 DIAGNOSIS — R079 Chest pain, unspecified: Secondary | ICD-10-CM | POA: Diagnosis not present

## 2023-08-26 LAB — ECHOCARDIOGRAM COMPLETE
AR max vel: 1.99 cm2
AV Area VTI: 2.05 cm2
AV Area mean vel: 1.99 cm2
AV Mean grad: 10.5 mmHg
AV Peak grad: 20.7 mmHg
Ao pk vel: 2.28 m/s
Area-P 1/2: 1.78 cm2
Height: 66 in
MV VTI: 2.21 cm2
S' Lateral: 2.8 cm
Weight: 2532.8 [oz_av]

## 2023-08-26 LAB — COMPREHENSIVE METABOLIC PANEL WITH GFR
ALT: 26 U/L (ref 0–44)
AST: 21 U/L (ref 15–41)
Albumin: 3.2 g/dL — ABNORMAL LOW (ref 3.5–5.0)
Alkaline Phosphatase: 119 U/L (ref 38–126)
Anion gap: 17 — ABNORMAL HIGH (ref 5–15)
BUN: 52 mg/dL — ABNORMAL HIGH (ref 6–20)
CO2: 26 mmol/L (ref 22–32)
Calcium: 9.1 mg/dL (ref 8.9–10.3)
Chloride: 94 mmol/L — ABNORMAL LOW (ref 98–111)
Creatinine, Ser: 11.11 mg/dL — ABNORMAL HIGH (ref 0.44–1.00)
GFR, Estimated: 4 mL/min — ABNORMAL LOW (ref >60)
Glucose, Bld: 92 mg/dL (ref 70–99)
Potassium: 4.4 mmol/L (ref 3.5–5.1)
Sodium: 137 mmol/L (ref 135–145)
Total Bilirubin: 0.5 mg/dL (ref 0.0–1.2)
Total Protein: 7.4 g/dL (ref 6.5–8.1)

## 2023-08-26 LAB — CBC
HCT: 37.6 % (ref 36.0–46.0)
Hemoglobin: 11.7 g/dL — ABNORMAL LOW (ref 12.0–15.0)
MCH: 27.7 pg (ref 26.0–34.0)
MCHC: 31.1 g/dL (ref 30.0–36.0)
MCV: 88.9 fL (ref 80.0–100.0)
Platelets: 177 10*3/uL (ref 150–400)
RBC: 4.23 MIL/uL (ref 3.87–5.11)
RDW: 18.4 % — ABNORMAL HIGH (ref 11.5–15.5)
WBC: 7 10*3/uL (ref 4.0–10.5)
nRBC: 0 % (ref 0.0–0.2)

## 2023-08-26 LAB — HEPARIN LEVEL (UNFRACTIONATED): Heparin Unfractionated: >1.1 [IU]/mL — ABNORMAL HIGH (ref 0.30–0.70)

## 2023-08-26 LAB — HEPATITIS B SURFACE ANTIBODY, QUANTITATIVE: Hep B S AB Quant (Post): 37.2 m[IU]/mL

## 2023-08-26 LAB — APTT: aPTT: 69 s — ABNORMAL HIGH (ref 24–36)

## 2023-08-26 MED ORDER — OXYCODONE-ACETAMINOPHEN 5-325 MG PO TABS
1.0000 | ORAL_TABLET | Freq: Four times a day (QID) | ORAL | Status: DC | PRN
  Administered 2023-08-26: 1 via ORAL
  Filled 2023-08-26: qty 1

## 2023-08-26 MED ORDER — OXYCODONE-ACETAMINOPHEN 5-325 MG PO TABS
ORAL_TABLET | ORAL | Status: AC
  Filled 2023-08-26: qty 1

## 2023-08-26 MED ORDER — MIDODRINE HCL 5 MG PO TABS
ORAL_TABLET | ORAL | Status: AC
Start: 2023-08-26 — End: 2023-08-26
  Filled 2023-08-26: qty 2

## 2023-08-26 MED ORDER — ONDANSETRON HCL 4 MG/2ML IJ SOLN
INTRAMUSCULAR | Status: AC
  Filled 2023-08-26: qty 2

## 2023-08-26 MED ORDER — OXYCODONE HCL 5 MG PO TABS
5.0000 mg | ORAL_TABLET | Freq: Four times a day (QID) | ORAL | Status: DC | PRN
  Administered 2023-08-26: 5 mg via ORAL
  Filled 2023-08-26: qty 1

## 2023-08-26 MED ORDER — MIDODRINE HCL 5 MG PO TABS
10.0000 mg | ORAL_TABLET | ORAL | Status: DC
  Administered 2023-08-26: 10 mg via ORAL
  Filled 2023-08-26: qty 2

## 2023-08-26 MED ORDER — ONDANSETRON HCL 4 MG/2ML IJ SOLN
4.0000 mg | Freq: Three times a day (TID) | INTRAMUSCULAR | Status: DC | PRN
  Administered 2023-08-26: 4 mg via INTRAVENOUS

## 2023-08-26 NOTE — Progress Notes (Signed)
 PHARMACY - ANTICOAGULATION CONSULT NOTE  Pharmacy Consult for Heparin  dosing  Indication: chest pain/ACS  Patient Measurements: Height: 5' 6 (167.6 cm) Weight: 71.8 kg (158 lb 4.8 oz) IBW/kg (Calculated) : 59.3 HEPARIN  DW (KG): 71  Vital Signs: Temp: 98.1 F (36.7 C) (06/12 0420) Temp Source: Oral (06/12 0420) BP: 91/54 (06/12 0420) Pulse Rate: 52 (06/12 0420)  Labs: Recent Labs    08/24/23 2059 08/25/23 0602 08/26/23 0353  HGB 12.8 11.3* 11.7*  HCT 41.0 36.0 37.6  PLT 204 183 177  APTT  --  69* 69*  HEPARINUNFRC  --  >1.10* >1.10*  CREATININE 7.80*  --  11.11*    Estimated Creatinine Clearance: 6.4 mL/min (A) (by C-G formula based on SCr of 11.11 mg/dL (H)).   Medical History: Past Medical History:  Diagnosis Date   Anemia    Anxiety    panic attacks   Arthritis    bilateral knees   Childhood asthma    Complication of anesthesia    sometimes it does not work; didn't during LEEP OR (01/21/2016)   Depression    no med   ESRD (end stage renal disease) on dialysis (HCC)    TTS; [facility]; [facility] (08/03/2016)   GERD (gastroesophageal reflux disease)    nexium prn   Gout    History of blood transfusion    related to kidneys; I've had 4 (01/21/2016)   Hypertension    Hypertensive urgency 12/05/2021   Migraine    last one 01/18/19   Myocardial infarction [facility]) 08/2022   Preterm labor ~ 2014   Retina hole, left    Seizures (HCC)    last one was in 2000; related to preeclampsia (01/21/2016)    Assessment: 48 YOF presented with CP and SOB. PMH of ESRD on HD, CAD, STEMI 08/2022 s/p angioplasty to OM and LAD. Patient on PTA Apixaban  for atrial fibrillation, with last dose 6/10 at ~1100 am today. Will require aPTT monitoring due to likely falsely high anti-Xa level secondary to DOAC use. Anticipating the anti-Xa level will be artifically elevated due to recent DOAC dose, will use aPTT to adjust heparin  infusion until correlating.   aPTT is  therapeutic at 69 on UFH IV infusion at 850 units/hr. CBC is stable, no signs of bleeding.  Goal of Therapy:  Heparin  level 0.3-0.7 units/ml aPTT 66-102 seconds Monitor platelets by anticoagulation protocol: Yes   Plan:  Continue heparin  infusion at 850 units/hr Continue to monitor via aPTT until levels heparin  levels correlate Continue to monitor H&H and platelets  Albino Alu, PharmD PGY2 Cardiology Pharmacy Resident 08/26/2023 6:25 AM

## 2023-08-26 NOTE — Plan of Care (Signed)
   Problem: Education: Goal: Knowledge of General Education information will improve Description Including pain rating scale, medication(s)/side effects and non-pharmacologic comfort measures Outcome: Progressing

## 2023-08-26 NOTE — Plan of Care (Signed)

## 2023-08-26 NOTE — Progress Notes (Addendum)
 [facility] Progress Note   Subjective: Seen on HD. BP soft but patient asymptomatic.  Very low HR. Concerned about using midodrine . Decrease BFR 300. Monitor closely  Objective Vitals:   08/26/23 0735 08/26/23 0829 08/26/23 0840 08/26/23 0845  BP: (!) 100/58 100/60 95/63 117/69  Pulse: (!) 55 (!) 54 (!) 56 (!) 56  Resp: 20 15 12 14   Temp: 98.2 F (36.8 C) 98.3 F (36.8 C)    TempSrc: Oral     SpO2: 100% 100% 100% 100%  Weight:      Height:       Physical Exam General: Chronically ill appearing female in NAD Heart: S1,S2 RRR No M/R/G. SR on monitor.  Lungs: CTAB No WOB Abdomen: NABS, NT Extremities: No LE edema Dialysis Access: L AVF cannulated.    Additional Objective Labs: Basic Metabolic Panel: Recent Labs  Lab 08/24/23 2059 08/26/23 0353  NA 140 137  K 4.5 4.4  CL 93* 94*  CO2 28 26  GLUCOSE 88 92  BUN 32* 52*  CREATININE 7.80* 11.11*  CALCIUM  9.7 9.1   Liver Function Tests: Recent Labs  Lab 08/24/23 2059 08/26/23 0353  AST 29 21  ALT 35 26  ALKPHOS 226* 119  BILITOT 0.4 0.5  PROT 9.1* 7.4  ALBUMIN  4.5 3.2*   No results for input(s): LIPASE, AMYLASE in the last 168 hours. CBC: Recent Labs  Lab 08/24/23 2059 08/25/23 0602 08/26/23 0353  WBC 5.7 5.4 7.0  HGB 12.8 11.3* 11.7*  HCT 41.0 36.0 37.6  MCV 88.2 88.2 88.9  PLT 204 183 177   Blood Culture    Component Value Date/Time   SDES BLOOD BLOOD RIGHT HAND 10/01/2022 2146   SDES BLOOD BLOOD RIGHT HAND 10/01/2022 2146   SPECREQUEST  10/01/2022 2146    BOTTLES DRAWN AEROBIC AND ANAEROBIC Blood Culture adequate volume   SPECREQUEST  10/01/2022 2146    BOTTLES DRAWN AEROBIC AND ANAEROBIC Blood Culture adequate volume   CULT  10/01/2022 2146    NO GROWTH 5 DAYS Performed at [facility], [address], [place], [place] [address]    CULT  10/01/2022 2146    NO GROWTH 5 DAYS Performed at [facility], [address], [place], [place] [address]     REPTSTATUS 10/06/2022 FINAL 10/01/2022 2146   REPTSTATUS 10/06/2022 FINAL 10/01/2022 2146    Cardiac Enzymes: No results for input(s): CKTOTAL, CKMB, CKMBINDEX, TROPONINI in the last 168 hours. CBG: No results for input(s): GLUCAP in the last 168 hours. Iron  Studies: No results for input(s): IRON , TIBC, TRANSFERRIN, FERRITIN in the last 72 hours. @lablastinr3 @ Studies/Results: DG Chest 2 View Result Date: 08/24/2023 CLINICAL DATA:  chest pain, sob EXAM: CHEST - 2 VIEW COMPARISON:  05/08/2023. FINDINGS: The heart size and mediastinal contours are within normal limits. Both lungs are clear. No pneumothorax or pleural effusion. There are thoracic degenerative changes. IMPRESSION: No acute cardiopulmonary disease. Electronically Signed   By: Sydell Eva M.D.   On: 08/24/2023 21:16   Medications:  heparin  850 Units/hr (08/26/23 0641)    amiodarone   200 mg Oral Daily   artificial tears  2 drop Both Eyes Daily   atorvastatin   80 mg Oral Daily   Chlorhexidine  Gluconate Cloth  6 each Topical Q0600   clopidogrel   75 mg Oral Q breakfast   fluticasone  furoate-vilanterol  1 puff Inhalation Daily   midodrine   10 mg Oral TID WC   pantoprazole  (PROTONIX ) IV  40 mg Intravenous Q12H  sevelamer  carbonate  1,600 mg Oral TID WC   sodium chloride  flush  3 mL Intravenous Q12H   sodium chloride  flush  3-10 mL Intravenous Q12H   OP HD orders:  Dialyzes at  [facility], TTS, 3 hr 45 mins, EDW 69/3 kg, 2k 2ca. LUE AVF Mircera 75 mcg on 6/10.  Sensipar  60 mg tiw    # Chest pain/CAD/unstable angina: Seen by cardiology team.  Currently on heparin  with consideration of cardiac cath. Troponin low and flat trend. ECHO ordered for later today.   # Bradycardia: HR in 50s noted in OP clinic as well as here. Concerned about using midodrine . Will change dose to three times per week with HD only.   # HFmrEF, NYHA class I- Euvolemic on exam. Optimize volume with HD.    # ESRD: TTS, labs and  volume status acceptable.  HD today on schedule.      # Hypertension/volume: Initial blood pressure was low now much better.  Hold antihypertensives.  UF with HD.   # Anemia of ESRD: Hemoglobin at goal, erythropoietin recently dosed as outpatient.   # Metabolic Bone Disease: Monitor labs and resume binders.  Emmajean Ratledge H. Armilda Vanderlinden NP-C 08/26/2023, 9:43 AM  [employer] [phone]

## 2023-08-26 NOTE — Hospital Course (Signed)
 48 y.o. F with ESRD on HD TThS, HTN, chronic respiratory failure on 3L home O2, seizure disorder and thromboembolic STEMI in 2024 presented with acute chest pain.

## 2023-08-26 NOTE — Progress Notes (Signed)
 Pt receives out-pt HD at [facility] on TTS 6:10 am chair time. Will assist as needed.   Lauraine Polite Renal Navigator [phone]

## 2023-08-26 NOTE — Progress Notes (Signed)
  Progress Note   Patient: Desiree [name]:[id number] DOB: 02-Jun-1975 DOA: 08/24/2023     1 DOS: the patient was seen and examined on 08/26/2023 at 8:37AM      Brief hospital course: 48 y.o. F with ESRD on HD TThS, HTN, chronic respiratory failure on 3L home O2, seizure disorder and thromboembolic STEMI in 2024 presented with acute chest pain.     Assessment and Plan: Principal Problem:   Chest pain Active Problems:   Anemia of chronic kidney failure   ESRD (end stage renal disease) on dialysis (HCC)   GERD (gastroesophageal reflux disease)   Chronic respiratory failure with hypoxia (HCC) - on hme O2 @ 3 L/min   Heart failure with mildly reduced ejection fraction (HFmrEF) (HCC)   Paroxysmal atrial fibrillation (HCC)   COPD    Patient provides conflicting history to me, both that this is identical to her 2024 STEMI pain and that it is provoked by swallowing.    Agree with Cardiology plan to obtain echo prior to further decision making. - Follow echo - Continue heparin  gtt - Continue atorvastatin , Plavix   For atrial fibrillation: - Continue heparin  and amiodarone   For hypotension; - Continue midodrine   For ESRD: - Consult Nephrology for HD  For COPD: No flare - Continue ICS/LABA         Subjective: Still with some pain this morning.  Not clearly food related today.  No fever no dyspnea, no swelling, no positional change to symptoms, no vomiting.     Physical Exam: BP (!) 102/55   Pulse (!) 55   Temp 98.2 F (36.8 C) (Oral)   Resp 20   Ht 5' 6 (1.676 m)   Wt 71.8 kg   SpO2 95%   BMI 25.55 kg/m   Thin adult female, lying in bed, no acute distress RRR no murmurs no LE edema Respiratory rate normal, lungs clear Abdomen soft, no TTP Attention normal, affect normal, moves all extremities with normal strength and coordination       Disposition: Status is: Inpatient         Author: Ephriam Hashimoto, MD 08/26/2023 5:38 PM  For  on call review [email or web address].

## 2023-08-26 NOTE — Progress Notes (Signed)
  Progress Note  Patient Name: Desiree [name] Date of Encounter: 08/26/2023 [facility] Cardiologist: Sheryle Donning, MD   Interval Summary   PPI increased from 20 to 40mg  BID with improvement in symptoms  Patient says she has a constant pain that is accentuated with swallowing; tells me PCP suspects esophageal spasm  No acute events overnight.  Had HD today  Vital Signs Vitals:   08/26/23 1200 08/26/23 1230 08/26/23 1236 08/26/23 1319  BP: (!) 90/52 103/67 (!) 102/55   Pulse: (!) 52 (!) 54 (!) 55   Resp: 18 18 18    Temp:   98.7 F (37.1 C) 98.2 F (36.8 C)  TempSrc:    Oral  SpO2: 100% 100% 100%   Weight:      Height:        Intake/Output Summary (Last 24 hours) at 08/26/2023 1404 Last data filed at 08/26/2023 1236 Gross per 24 hour  Intake 371.4 ml  Output 1100 ml  Net -728.6 ml      08/26/2023    5:32 AM 08/25/2023    1:35 PM 08/24/2023    8:44 PM  Last 3 Weights  Weight (lbs) 158 lb 4.8 oz 156 lb 8.4 oz 150 lb  Weight (kg) 71.804 kg 71 kg 68.04 kg      Telemetry/ECG  SR - Personally Reviewed  Physical Exam  GEN: No acute distress.   Neck: No JVD Cardiac: RRR, no murmurs, rubs, or gallops.  Respiratory: Clear to auscultation bilaterally. GI: Soft, nontender, non-distended  MS: No edema  Assessment & Plan  NSTEMI/Elevated troponin:    Mildly elevated trops  Has a history of ACS due to undiagnosed AF >> distal LAD and Lcx thromboembolism without high grade CTA >> PTCA  LAD and Lcx 2024 F/U TTE if unchanged, would defer inschemic evaluation  NICM  EF 45-50% TTE August  Chronically hypotensive and on midodrine ; limits GDMT  PAF  Continue amiodarone  200  On hep gtt in house, restart Eliquis  at discharge  Chest pain  Seems atypical and GI-related given improvement with augmented PPI  ESRD associated with esophageal dysmotility >> consider GI evaluation        For questions or updates, please contact   [facility] Please consult [email or web address] for contact info under       Signed, Jessalynn Mccowan K Marquise Wicke, MD

## 2023-08-27 DIAGNOSIS — R079 Chest pain, unspecified: Secondary | ICD-10-CM | POA: Diagnosis not present

## 2023-08-27 LAB — CBC
HCT: 35.8 % — ABNORMAL LOW (ref 36.0–46.0)
Hemoglobin: 11 g/dL — ABNORMAL LOW (ref 12.0–15.0)
MCH: 27.5 pg (ref 26.0–34.0)
MCHC: 30.7 g/dL (ref 30.0–36.0)
MCV: 89.5 fL (ref 80.0–100.0)
Platelets: 195 10*3/uL (ref 150–400)
RBC: 4 MIL/uL (ref 3.87–5.11)
RDW: 18.5 % — ABNORMAL HIGH (ref 11.5–15.5)
WBC: 6.7 10*3/uL (ref 4.0–10.5)
nRBC: 0 % (ref 0.0–0.2)

## 2023-08-27 LAB — RENAL FUNCTION PANEL
Albumin: 3 g/dL — ABNORMAL LOW (ref 3.5–5.0)
Anion gap: 11 (ref 5–15)
BUN: 32 mg/dL — ABNORMAL HIGH (ref 6–20)
CO2: 29 mmol/L (ref 22–32)
Calcium: 8.8 mg/dL — ABNORMAL LOW (ref 8.9–10.3)
Chloride: 95 mmol/L — ABNORMAL LOW (ref 98–111)
Creatinine, Ser: 7.95 mg/dL — ABNORMAL HIGH (ref 0.44–1.00)
GFR, Estimated: 6 mL/min — ABNORMAL LOW (ref >60)
Glucose, Bld: 103 mg/dL — ABNORMAL HIGH (ref 70–99)
Phosphorus: 4.4 mg/dL (ref 2.5–4.6)
Potassium: 4.8 mmol/L (ref 3.5–5.1)
Sodium: 135 mmol/L (ref 135–145)

## 2023-08-27 LAB — APTT: aPTT: 52 s — ABNORMAL HIGH (ref 24–36)

## 2023-08-27 LAB — LIPID PANEL
Cholesterol: 96 mg/dL (ref 0–200)
HDL: 50 mg/dL (ref >40)
LDL Cholesterol: 41 mg/dL (ref 0–99)
Total CHOL/HDL Ratio: 1.9 ratio
Triglycerides: 26 mg/dL (ref <150)
VLDL: 5 mg/dL (ref 0–40)

## 2023-08-27 MED ORDER — PANTOPRAZOLE SODIUM 40 MG PO TBEC
40.0000 mg | DELAYED_RELEASE_TABLET | Freq: Every day | ORAL | Status: DC
  Administered 2023-08-27: 40 mg via ORAL
  Filled 2023-08-27: qty 1

## 2023-08-27 MED ORDER — APIXABAN 5 MG PO TABS
5.0000 mg | ORAL_TABLET | Freq: Two times a day (BID) | ORAL | Status: DC
  Administered 2023-08-27: 5 mg via ORAL
  Filled 2023-08-27: qty 1

## 2023-08-27 NOTE — Discharge Planning (Signed)
 [facility] Patient Discharge Orders- [facility] CLINIC: [facility]  Patient's name: HAELEY FORDHAM Admit/DC Dates: 08/24/2023 - 08/27/2023  Discharge Diagnoses: Chest pain, likely dysphagia     Aranesp : Given: No   Date and amount of last dose: NA   PRBC's Given: No Date/# of units: NA Last Hgb: 11.0 ESA dose for discharge: mircera 50 mcg IV q 2 weeks  IV Iron  dose at discharge: Per protocol-patient only accepts IV diluted in NS  Heparin  change: No  EDW Change: Yes  New EDW: 70 kg  [place] Change: No  Access intervention/Change: No Details:  Hectorol /Calcitriol change: No  Discharge Labs: Calcium  8.8 Phosphorus 4.4 Albumin  3.0 K+ 4.8  IV Antibiotics: No Details:  On Coumadin?: No Last INR: Next INR: Managed By:   OTHER/APPTS/LAB ORDERS:    D/C Meds to be reconciled by nurse after every discharge.  Completed By: Jacobo Masters [facility] [phone]   Reviewed by: MD:______ RN_______

## 2023-08-27 NOTE — Discharge Summary (Signed)
 Physician Discharge Summary   Patient: Desiree [name] MRN: [id number] DOB: Sep 18, 1975  Admit date:     08/24/2023  Discharge date: 08/27/23  Discharge Physician: Ephriam Hashimoto   PCP: Delbert Feathers, MD     Recommendations at discharge:  Follow up with PCP Dr. Broadus Canes for pain with swallowing Dr. Broadus Canes: Please obtain barium esophagram If chest pain with eating continues, please refer to GI     Discharge Diagnoses: Principal Problem:   Chest pain Active Problems:   Anemia of chronic kidney failure   ESRD (end stage renal disease) on dialysis (HCC)   GERD (gastroesophageal reflux disease)   Chronic respiratory failure with hypoxia (HCC) - on hme O2 @ 3 L/min   Heart failure with mildly reduced ejection fraction (HFmrEF) (HCC)   Paroxysmal atrial fibrillation [facility])      Hospital Course: 48 y.o. F with ESRD on HD TThS, HTN, chronic respiratory failure on 3L home O2, seizure disorder and thromboembolic STEMI in 2024 presented with acute chest pain.    Chest pain, likely dysphagia Patient was admitted.  To Cardiology, she described variable symptoms, including an orthostatic episode after her dialysis the day before admission, then chest pain, described as substernal chest pressure radiating to the back as if she had swallowed food and it got stuck.  Because this symptom reminded her of her MRI from 1 year ago she came to the ER, where Maalox improved her symptoms significantly.  Troponin was elevated, but this was likely not myocardial injury or ischemia, but instead poor clearance due to renal failure.  She was evaluated by cardiology and underwent echocardiogram that showed no regional wall motion abnormalities.  In the hospital her symptoms again recurred with swallowing, and given her normal echocardiogram and within expected limits troponin given her renal failure, cardiology recommended outpatient follow-up, no further ischemic workup in the  hospital.  Suspect her chest discomfort was dysphagia, either heartburn or esophageal dysmotility.  Recommend outpatient barium esophagram and GI follow-up.          The [place]  Controlled Substances Registry was reviewed for this patient prior to discharge.   Consultants: Cardiology Procedures performed: Echo  Disposition: Home Diet recommendation:  Discharge Diet Orders (From admission, onward)     Start     Ordered   08/27/23 0000  Diet - low sodium heart healthy        08/27/23 0912             DISCHARGE MEDICATION: Allergies as of 08/27/2023       Reactions   Iron  Sucrose Itching   Other Reaction(s): C/O of Being Hot, headache   Morphine  Shortness Of Breath, Anaphylaxis   Prednisone Other (See Comments)   Other reaction(s): Other (See Comments) Muscle spasms Patient says prednisone causes her to cramp all over, muscle spasms uncontrolled   Tuna [fish Allergy] Itching, Swelling, Rash, Other (See Comments)   Face droops also   Amlodipine Other (See Comments)   Angioedema (09/05/17 ED visit)   Iodinated Contrast Media Itching   Tape Itching   Adhesive tape   paper tape ok        Medication List     STOP taking these medications    aspirin  81 MG chewable tablet       TAKE these medications    ALPRAZolam  1 MG tablet Commonly known as: XANAX  Take 1 tablet (1 mg total) by mouth 3 (three) times daily as needed for anxiety. Take 30 minutes prior  to MRI What changed: when to take this   amiodarone  200 MG tablet Commonly known as: PACERONE  Take 1 tablet (200 mg total) by mouth daily. What changed: when to take this   apixaban  5 MG Tabs tablet Commonly known as: ELIQUIS  Take 1 tablet (5 mg total) by mouth 2 (two) times daily.   atorvastatin  80 MG tablet Commonly known as: LIPITOR Take 1 tablet (80 mg total) by mouth daily. What changed: when to take this   carboxymethylcellulose 0.5 % Soln Commonly known as: REFRESH PLUS Place 2  drops into both eyes daily.   cinacalcet  60 MG tablet Commonly known as: SENSIPAR  Take 60 mg by mouth every dialysis (Dialysis: Tuesday, Thursday, and Saturday).   clopidogrel  75 MG tablet Commonly known as: PLAVIX  Take 1 tablet (75 mg total) by mouth daily with breakfast.   cyclobenzaprine  10 MG tablet Commonly known as: FLEXERIL  Take 10 mg by mouth 2 (two) times daily as needed for muscle spasms.   fluticasone  furoate-vilanterol 100-25 MCG/ACT Aepb Commonly known as: Breo Ellipta  Inhale 1 puff into the lungs daily.   folic acid -vitamin b complex-vitamin c-selenium-zinc 3 MG Tabs tablet Take 1 tablet by mouth in the morning. Chew tablets   guaiFENesin -dextromethorphan 100-10 MG/5ML syrup Commonly known as: ROBITUSSIN DM Take 10 mLs by mouth every 4 (four) hours as needed for cough.   lactulose  10 GM/15ML solution Commonly known as: CHRONULAC  Take 30 mLs by mouth daily as needed for mild constipation, moderate constipation or severe constipation.   midodrine  10 MG tablet Commonly known as: PROAMATINE  Take 10 mg by mouth 3 (three) times daily as needed (Hypotension).   Narcan  4 MG/0.1ML Liqd nasal spray kit Generic drug: naloxone  Place 1 spray into the nose as needed (accidental overdose).   nystatin cream Commonly known as: MYCOSTATIN Apply 1 Application topically daily. Apply to recurrent boils on buttocks   ondansetron  8 MG disintegrating tablet Commonly known as: ZOFRAN -ODT Take 16 mg by mouth daily as needed for nausea or refractory nausea / vomiting.   oxyCODONE -acetaminophen  10-325 MG tablet Commonly known as: PERCOCET Take 1 tablet by mouth every 6 (six) hours as needed.   pantoprazole  40 MG tablet Commonly known as: Protonix  Take 1 tablet (40 mg total) by mouth daily. What changed: when to take this   sevelamer  carbonate 800 MG tablet Commonly known as: RENVELA  Take 1,600 mg by mouth 3 (three) times daily with meals.        Follow-up Information      Delbert Feathers, MD. Schedule an appointment as soon as possible for a visit in 1 week(s).   Specialty: Family Medicine Contact information: [address] [place] [address] [phone]         Sheryle Donning, MD Follow up.   Specialty: Cardiology Contact information: [address] [place] [address] [phone]                 Discharge Instructions     Diet - low sodium heart healthy   Complete by: As directed    Discharge instructions   Complete by: As directed    **IMPORTANT DISCHARGE INSTRUCTIONS**   From Dr. Darlyn Eke: You were admitted for chest pain with swallowing.  Here, we were initially concerned for a heart attack  We ruled this out with blood testing (troponin enzymes) and an echocardiogram (ultrasound of the heart)  Both of these were reassuring, and our Cardiologists recommend no change to your medicines and follow up with your cardiologist in the  office on July 8 (see below in the To Do section for the appointment date and time)  It may be that this is pain from the esophagus  Continue your home pantoprazole   Go see your primary doctor in 1 week and ask him to order an esophagram (I will share this in the discharge summary)   Increase activity slowly   Complete by: As directed        Discharge Exam: Filed Weights   08/26/23 0532 08/26/23 1236 08/27/23 0345  Weight: 71.8 kg 70.7 kg 71.1 kg    General: Pt is alert, awake, not in acute distress Cardiovascular: RRR, nl S1-S2, no murmurs appreciated.   No LE edema.   Respiratory: Normal respiratory rate and rhythm.  CTAB without rales or wheezes. Abdominal: Abdomen soft and non-tender.  No distension or HSM.   Neuro/Psych: Strength symmetric in upper and lower extremities.  Judgment and insight appear normal.   Condition at discharge: good  The results of significant diagnostics from this hospitalization (including imaging, microbiology, ancillary and  laboratory) are listed below for reference.   Imaging Studies: ECHOCARDIOGRAM COMPLETE Result Date: 08/26/2023    ECHOCARDIOGRAM REPORT   Patient Name:   Desiree [name] Date of Exam: 08/26/2023 Medical Rec #:  [id number]       Height:       66.0 in Accession #:    [id number]      Weight:       158.3 lb Date of Birth:  05-24-1975        BSA:          1.811 m Patient Age:    48 years        BP:           100/58 mmHg Patient Gender: F               HR:           59 bpm. Exam Location:  Inpatient Procedure: 2D Echo, Cardiac Doppler, Color Doppler and Strain Analysis (Both            Spectral and Color Flow Doppler were utilized during procedure). Indications:    chest pain  History:        Patient has prior history of Echocardiogram examinations, most                 recent 11/04/2022.  Sonographer:    Griselda Lederer Referring Phys: [id number] Leala Prince IMPRESSIONS  1. Left ventricular ejection fraction, by estimation, is 55 to 60%. The left ventricle has normal function. The left ventricle has no regional wall motion abnormalities. Left ventricular diastolic parameters were normal. The average left ventricular global longitudinal strain is -20.7 %. The global longitudinal strain is normal.  2. Right ventricular systolic function is normal. The right ventricular size is normal. There is normal pulmonary artery systolic pressure.  3. Left atrial size was moderately dilated.  4. A small pericardial effusion is present. The pericardial effusion is posterior to the left ventricle. There is no evidence of cardiac tamponade.  5. The mitral valve is degenerative. Trivial mitral valve regurgitation. No evidence of mitral stenosis. Moderate mitral annular calcification.  6. The aortic valve is tricuspid. Aortic valve regurgitation is not visualized. Aortic valve sclerosis/calcification is present, without any evidence of aortic stenosis.  7. The inferior vena cava is normal in size with greater than 50% respiratory variability,  suggesting right atrial pressure of 3 mmHg. Comparison(s): Prior images reviewed side by side.  Changes from prior study are noted. EF improved compared to prior. FINDINGS  Left Ventricle: Left ventricular ejection fraction, by estimation, is 55 to 60%. The left ventricle has normal function. The left ventricle has no regional wall motion abnormalities. The average left ventricular global longitudinal strain is -20.7 %. Strain was performed and the global longitudinal strain is normal. The left ventricular internal cavity size was normal in size. There is no left ventricular hypertrophy. Left ventricular diastolic parameters were normal. Right Ventricle: The right ventricular size is normal. No increase in right ventricular wall thickness. Right ventricular systolic function is normal. There is normal pulmonary artery systolic pressure. The tricuspid regurgitant velocity is 2.35 m/s, and  with an assumed right atrial pressure of 3 mmHg, the estimated right ventricular systolic pressure is 25.1 mmHg. Left Atrium: Left atrial size was moderately dilated. Right Atrium: Right atrial size was normal in size. Pericardium: A small pericardial effusion is present. The pericardial effusion is posterior to the left ventricle. There is no evidence of cardiac tamponade. Mitral Valve: The mitral valve is degenerative in appearance. Moderate mitral annular calcification. Trivial mitral valve regurgitation. No evidence of mitral valve stenosis. MV peak gradient, 7.6 mmHg. The mean mitral valve gradient is 2.0 mmHg. Tricuspid Valve: The tricuspid valve is normal in structure. Tricuspid valve regurgitation is mild . No evidence of tricuspid stenosis. Aortic Valve: The aortic valve is tricuspid. Aortic valve regurgitation is not visualized. Aortic valve sclerosis/calcification is present, without any evidence of aortic stenosis. Aortic valve mean gradient measures 10.5 mmHg. Aortic valve peak gradient  measures 20.7 mmHg. Aortic valve  area, by VTI measures 2.05 cm. Pulmonic Valve: The pulmonic valve was not well visualized. Pulmonic valve regurgitation is trivial. No evidence of pulmonic stenosis. Aorta: The aortic root and ascending aorta are structurally normal, with no evidence of dilitation. Venous: The inferior vena cava is normal in size with greater than 50% respiratory variability, suggesting right atrial pressure of 3 mmHg. IAS/Shunts: The atrial septum is grossly normal.  LEFT VENTRICLE PLAX 2D LVIDd:         4.60 cm   Diastology LVIDs:         2.80 cm   LV e' medial:    9.79 cm/s LV PW:         1.00 cm   LV E/e' medial:  12.9 LV IVS:        1.00 cm   LV e' lateral:   9.48 cm/s LVOT diam:     1.90 cm   LV E/e' lateral: 13.3 LV SV:         99 LV SV Index:   55        2D Longitudinal Strain LVOT Area:     2.84 cm  2D Strain GLS (A4C):   -21.5 %                          2D Strain GLS (A3C):   -20.8 %                          2D Strain GLS (A2C):   -20.0 %                          2D Strain GLS Avg:     -20.7 %  3D Volume EF                          LV 3D EDV:   87.60 ml                          LV 3D ESV:   31.84 ml RIGHT VENTRICLE             IVC RV Basal diam:  4.00 cm     IVC diam: 1.00 cm RV S prime:     14.90 cm/s TAPSE (M-mode): 2.8 cm LEFT ATRIUM             Index LA diam:        3.60 cm 1.99 cm/m LA Vol (A2C):   70.2 ml 38.77 ml/m LA Vol (A4C):   76.3 ml 42.14 ml/m LA Biplane Vol: 74.7 ml 41.26 ml/m  AORTIC VALVE AV Area (Vmax):    1.99 cm AV Area (Vmean):   1.99 cm AV Area (VTI):     2.05 cm AV Vmax:           227.50 cm/s AV Vmean:          148.000 cm/s AV VTI:            0.482 m AV Peak Grad:      20.7 mmHg AV Mean Grad:      10.5 mmHg LVOT Vmax:         159.50 cm/s LVOT Vmean:        104.000 cm/s LVOT VTI:          0.349 m LVOT/AV VTI ratio: 0.72  AORTA Ao Asc diam: 2.90 cm MITRAL VALVE                TRICUSPID VALVE MV Area (PHT): 1.78 cm     TR Peak grad:   22.1 mmHg MV Area VTI:   2.21 cm      TR Vmax:        235.00 cm/s MV Peak grad:  7.6 mmHg MV Mean grad:  2.0 mmHg     SHUNTS MV Vmax:       1.38 m/s     Systemic VTI:  0.35 m MV Vmean:      69.4 cm/s    Systemic Diam: 1.90 cm MV Decel Time: 426 msec MV E velocity: 126.00 cm/s MV A velocity: 63.50 cm/s MV E/A ratio:  1.98 Sheryle Donning MD Electronically signed by Sheryle Donning MD Signature Date/Time: 08/26/2023/8:45:27 PM    Final    DG Chest 2 View Result Date: 08/24/2023 CLINICAL DATA:  chest pain, sob EXAM: CHEST - 2 VIEW COMPARISON:  05/08/2023. FINDINGS: The heart size and mediastinal contours are within normal limits. Both lungs are clear. No pneumothorax or pleural effusion. There are thoracic degenerative changes. IMPRESSION: No acute cardiopulmonary disease. Electronically Signed   By: Sydell Eva M.D.   On: 08/24/2023 21:16    Microbiology: Results for orders placed or performed during the hospital encounter of 05/08/23  Resp panel by RT-PCR (RSV, Flu A&B, Covid) Anterior Nasal Swab     Status: Abnormal   Collection Time: 05/08/23  2:44 PM   Specimen: Anterior Nasal Swab  Result Value Ref Range Status   SARS Coronavirus 2 by RT PCR NEGATIVE NEGATIVE Final   Influenza A by PCR POSITIVE (A) NEGATIVE Final   Influenza B by PCR NEGATIVE NEGATIVE Final  Comment: (NOTE) The Xpert Xpress SARS-CoV-2/FLU/RSV plus assay is intended as an aid in the diagnosis of influenza from Nasopharyngeal swab specimens and should not be used as a sole basis for treatment. Nasal washings and aspirates are unacceptable for Xpert Xpress SARS-CoV-2/FLU/RSV testing.  Fact Sheet for Patients: BloggerCourse.com  Fact Sheet for Healthcare Providers: SeriousBroker.it  This test is not yet approved or cleared by the [place]  FDA and has been authorized for detection and/or diagnosis of SARS-CoV-2 by FDA under an Emergency Use Authorization (EUA). This EUA will remain in  effect (meaning this test can be used) for the duration of the COVID-19 declaration under Section 564(b)(1) of the Act, 21 U.S.C. section 360bbb-3(b)(1), unless the authorization is terminated or revoked.     Resp Syncytial Virus by PCR NEGATIVE NEGATIVE Final    Comment: (NOTE) Fact Sheet for Patients: BloggerCourse.com  Fact Sheet for Healthcare Providers: SeriousBroker.it  This test is not yet approved or cleared by the [place]  FDA and has been authorized for detection and/or diagnosis of SARS-CoV-2 by FDA under an Emergency Use Authorization (EUA). This EUA will remain in effect (meaning this test can be used) for the duration of the COVID-19 declaration under Section 564(b)(1) of the Act, 21 U.S.C. section 360bbb-3(b)(1), unless the authorization is terminated or revoked.  Performed at [facility], [address], [place], [place] [address]     Labs: CBC: Recent Labs  Lab 08/24/23 2059 08/25/23 0602 08/26/23 0353 08/27/23 0324  WBC 5.7 5.4 7.0 6.7  HGB 12.8 11.3* 11.7* 11.0*  HCT 41.0 36.0 37.6 35.8*  MCV 88.2 88.2 88.9 89.5  PLT 204 183 177 195   Basic Metabolic Panel: Recent Labs  Lab 08/24/23 2059 08/26/23 0353 08/27/23 0324  NA 140 137 135  K 4.5 4.4 4.8  CL 93* 94* 95*  CO2 28 26 29   GLUCOSE 88 92 103*  BUN 32* 52* 32*  CREATININE 7.80* 11.11* 7.95*  CALCIUM  9.7 9.1 8.8*  PHOS  --   --  4.4   Liver Function Tests: Recent Labs  Lab 08/24/23 2059 08/26/23 0353 08/27/23 0324  AST 29 21  --   ALT 35 26  --   ALKPHOS 226* 119  --   BILITOT 0.4 0.5  --   PROT 9.1* 7.4  --   ALBUMIN  4.5 3.2* 3.0*   CBG: No results for input(s): GLUCAP in the last 168 hours.  Discharge time spent: approximately 45 minutes spent on discharge counseling, evaluation of patient on day of discharge, and coordination of discharge planning with nursing, social work, pharmacy and case  management  Signed: Ephriam Hashimoto, MD [facility] 08/27/2023

## 2023-08-27 NOTE — Progress Notes (Signed)
D/C order noted. Contacted [facility] to be advised of pt's d/c today and that pt should resume care tomorrow.   Olivia Canter Renal Navigator [phone]

## 2023-08-29 ENCOUNTER — Telehealth: Payer: Self-pay | Admitting: Physician Assistant

## 2023-08-29 NOTE — Telephone Encounter (Signed)
 Transition of Care - Initial Contact after Hospitalization  Date of discharge:  08/27/23 Date of contact: 08/29/23  Method: Phone Spoke to: Patient  Patient contacted to discuss transition of care from recent inpatient hospitalization. Patient was admitted to [facility] from 08/24/23 to 08/27/23 with discharge diagnosis of: chest pain, dysphagia.  The discharge medication list was reviewed. Patient understands the changes and has no concerns. Discussed that she needs to follow up with PCP for barium esophogram. She does not have any home health services ordered and does not feel like she needs them, independent of ADLs.   Patient will return to his/her outpatient HD unit on: Tuesday  No other concerns at this time.  Ramona Burner, PA-C 08/29/2023, 9:43 AM  [facility] Pager: [phone]

## 2023-09-19 ENCOUNTER — Other Ambulatory Visit (HOSPITAL_BASED_OUTPATIENT_CLINIC_OR_DEPARTMENT_OTHER): Payer: Self-pay | Admitting: Family

## 2023-09-19 DIAGNOSIS — I25118 Atherosclerotic heart disease of native coronary artery with other forms of angina pectoris: Secondary | ICD-10-CM

## 2023-09-19 DIAGNOSIS — E785 Hyperlipidemia, unspecified: Secondary | ICD-10-CM

## 2023-09-21 ENCOUNTER — Other Ambulatory Visit (HOSPITAL_BASED_OUTPATIENT_CLINIC_OR_DEPARTMENT_OTHER)

## 2023-09-21 ENCOUNTER — Ambulatory Visit (HOSPITAL_BASED_OUTPATIENT_CLINIC_OR_DEPARTMENT_OTHER): Admitting: Family

## 2023-09-21 ENCOUNTER — Encounter (HOSPITAL_BASED_OUTPATIENT_CLINIC_OR_DEPARTMENT_OTHER): Payer: Self-pay | Admitting: Family

## 2023-09-21 VITALS — BP 100/56 | HR 77 | Resp 16 | Ht 64.0 in | Wt 158.5 lb

## 2023-09-21 DIAGNOSIS — N186 End stage renal disease: Secondary | ICD-10-CM

## 2023-09-21 DIAGNOSIS — I480 Paroxysmal atrial fibrillation: Secondary | ICD-10-CM

## 2023-09-21 DIAGNOSIS — E785 Hyperlipidemia, unspecified: Secondary | ICD-10-CM

## 2023-09-21 DIAGNOSIS — Z992 Dependence on renal dialysis: Secondary | ICD-10-CM

## 2023-09-21 DIAGNOSIS — D6859 Other primary thrombophilia: Secondary | ICD-10-CM | POA: Diagnosis not present

## 2023-09-21 DIAGNOSIS — I25118 Atherosclerotic heart disease of native coronary artery with other forms of angina pectoris: Secondary | ICD-10-CM

## 2023-09-21 NOTE — Patient Instructions (Signed)
 Medication Instructions:  Your physician recommends that you continue on your current medications as directed. Please refer to the Current Medication list given to you today.  Testing/Procedures: Your physician has recommended that you wear a Zio monitor.   This monitor is a medical device that records the heart's electrical activity. Doctors most often use these monitors to diagnose arrhythmias. Arrhythmias are problems with the speed or rhythm of the heartbeat. The monitor is a small device applied to your chest. You can wear one while you do your normal daily activities. While wearing this monitor if you have any symptoms to push the button and record what you felt. Once you have worn this monitor for the period of time provider prescribed (Usually 14 days), you will return the monitor device in the postage paid box. Once it is returned they will download the data collected and provide us  with a report which the provider will then review and we will call you with those results. Important tips:  Avoid showering during the first 24 hours of wearing the monitor. Avoid excessive sweating to help maximize wear time. Do not submerge the device, no hot tubs, and no swimming pools. Keep any lotions or oils away from the patch. After 24 hours you may shower with the patch on. Take brief showers with your back facing the shower head.  Do not remove patch once it has been placed because that will interrupt data and decrease adhesive wear time. Push the button when you have any symptoms and write down what you were feeling. Once you have completed wearing your monitor, remove and place into box which has postage paid and place in your outgoing mailbox.  If for some reason you have misplaced your box then call our office and we can provide another box and/or mail it off for you.  Follow-Up: Follow up as scheduled

## 2023-09-21 NOTE — Progress Notes (Signed)
 Cardiology Office Note   Date:  09/21/2023  ID:  Desiree [name], DOB 07-04-1975, MRN [id number] PCP: Desiree [name] HERO, Desiree [name]  [facility] Providers Cardiologist:  Desiree Bruckner, Desiree [name]     History of Present Illness Desiree [name] is a 48 y.o. female with history of ESRD on HD (T/R/Sa), HTN, seizure, migraine, chronic respiratory failure on home O2, CAD s/p thrombotic anterior STEMI 09/12/2022 with angioplasty, atrial fibrillation, hyperlipidemia, ICM with recovered LVEF.  Presented 09/12/22 with severe crushing chest pain associate with nausea and vomiting.  Anterior STEMI by EMS.  LHC 09/13/2022 thromboembolic event to coronaries likely from previously undetected atrial fibrillation.  Occlusion of left distal OM 3 and apical LAD noted, angioplasty with minimal flow improvement.  Echo LVEF 35 to 40%.  Also revealed calcified mobile MAC with no clinical signs of endocarditis.  Discharged on Eliquis , Plavix .  Heart failure GDMT limited by hypotension.  Readmitted 09/2022 with chest pain with limited echo LVEF 45 to 50%, no RWMA, moderate pericardial effusion without tamponade.  Chest pain felt to be related to panic attack.  Miss in a/2024 with palpitations EKG found to be A-fib with RVR started on amiodarone .  Last seen 03/02/2023.  She reported hypotension with dialysis, intermittent episodes of palpitations.  She was recommended continue long-term amiodarone  for rhythm management.  Midodrine  as needed instructions reviewed. Admission 04/02/2023 with AV fistulogram Admission 05/08/2023 influenza, ESRD Admission 6/10 - 08/27/2023 with chest pain related to dysphagia. Echo no RWMA, troponin elevated in setting of renal failure Suspected GI etiology. Recommend follow-up outpatient regarding pain with swallowing with possible barium esophagram and/or referral to GI with PCP.  Discussed the use of AI scribe software for clinical note transcription with the patient, who gave verbal consent to  proceed.  History of Present Illness Desiree [name] is a 48 year old female with a history of heart issues and dialysis treatment who presents with chest pain and difficulty swallowing.  She experiences daily chest pain described as tightness, occurring at rest and during activity, sometimes lasting all day. She is unsure if it is related to her heart or dialysis treatment. Difficulty swallowing is associated with chest tightness, and she uses a liquid Carafate for temporary relief. She feels something stuck when swallowing despite improved appetite.  She experiences a sensation of 'bubbles' or tingling in her chest every other day, occurring at different times from her chest pain. Her breathing is generally fine, and she uses oxygen  at night and during dialysis.  Her blood pressure during dialysis is labile, sometimes requiring midodrine .   Dispense report: Amiodarone  200mg  07/18/23 90 day supply Eliquis  5mg  08/31/23 30 day supply Atorvastatin  80mg  09/20/23 90 day Plavix  75mg  07/11/23 90 day Midodrine  10mg  08/31/23 15 tablets (before dialysis)  ROS: Please see the history of present illness.    All other systems reviewed and are negative.   Studies Reviewed      Cardiac Studies & Procedures   ______________________________________________________________________________________________ CARDIAC CATHETERIZATION  CARDIAC CATHETERIZATION 09/13/2022  Conclusion   Lat 3rd Mrg lesion is 100% stenosed.  Balloon angioplasty was performed using a BALLN EMERGE MR 2.0X12.   Post intervention, there is a 0% residual stenosis.   Dist LAD lesion is 100% stenosed.  Balloon angioplasty was performed using a BALLN EMERGE MR 2.0X12.   Post intervention, there is a 100% residual stenosis.   Mid Cx lesion is 50% stenosed.  Thrombotic.   LV end diastolic pressure is moderately elevated.   Recommend to resume Apixaban ,  at currently prescribed dose and frequency.   Recommend concurrent antiplatelet therapy  of Clopidogrel  75mg  daily for 12 months.  No atherosclerotic coronary artery disease.  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  Angioplasty done in both areas but neither area receiving TIMI-3 flow.  Resume heparin  6 hours after femoral sheath removal.  She will need echocardiogram to look for source of embolism.  Would consider long-term oral anticoagulation given her presentation here today.  Could also use clopidogrel  for antiplatelet therapy depending on whether or not she has bleeding issues.  Will watch in 2 heart.  I spoke to her daughter, Jada, and conveyed the results.  Findings Coronary Findings Diagnostic  Dominance: Right  Left Anterior Descending Dist LAD lesion is 100% stenosed.  Left Circumflex Mid Cx lesion is 50% stenosed. The lesion is thrombotic.  Lateral Third Obtuse Marginal Branch Lat 3rd Mrg lesion is 100% stenosed.  Intervention  Dist LAD lesion Angioplasty CATH LAUNCHER 6FR EBU3.5 guide catheter was inserted. WIRE RUNTHROUGH .[id number] guidewire used to cross lesion. Balloon angioplasty was performed using a BALLN EMERGE MR 2.0X12. After the angioplasty, flow reached further in the apical LAD but there was still a cut off before the end of the vessel. Post-Intervention Lesion Assessment The intervention was unsuccessful. Pre-interventional TIMI flow is 0. Post-intervention TIMI flow is 0. There is a 100% residual stenosis post intervention.  Lat 3rd Mrg lesion Angioplasty CATH LAUNCHER 6FR EBU3.5 guide catheter was inserted. WIRE RUNTHROUGH .[id number] guidewire used to cross lesion. Balloon angioplasty was performed using a BALLN EMERGE MR 2.0X12. Post-Intervention Lesion Assessment The intervention was successful. Pre-interventional TIMI flow is 0. Post-intervention TIMI flow is 2. No complications occurred at this lesion. There is a 0% residual stenosis post intervention.      ECHOCARDIOGRAM  ECHOCARDIOGRAM COMPLETE 08/26/2023  Narrative ECHOCARDIOGRAM REPORT    Patient Name:   Desiree [name] Date of Exam: 08/26/2023 Medical Rec #:  [id number]       Height:       66.0 in Accession #:    [id number]      Weight:       158.3 lb Date of Birth:  1976-01-18        BSA:          1.811 m Patient Age:    48 years        BP:           100/58 mmHg Patient Gender: F               HR:           59 bpm. Exam Location:  Inpatient  Procedure: 2D Echo, Cardiac Doppler, Color Doppler and Strain Analysis (Both Spectral and Color Flow Doppler were utilized during procedure).  Indications:    chest pain  History:        Patient has prior history of Echocardiogram examinations, most recent 11/04/2022.  Sonographer:    Therisa Crouch Referring Phys: [id number] ARTIST POUCH  IMPRESSIONS   1. Left ventricular ejection fraction, by estimation, is 55 to 60%. The left ventricle has normal function. The left ventricle has no regional wall motion abnormalities. Left ventricular diastolic parameters were normal. The average left ventricular global longitudinal strain is -20.7 %. The global longitudinal strain is normal. 2. Right ventricular systolic function is normal. The right ventricular size is normal. There is normal pulmonary artery systolic pressure. 3. Left atrial  size was moderately dilated. 4. A small pericardial effusion is present. The pericardial effusion is posterior to the left ventricle. There is no evidence of cardiac tamponade. 5. The mitral valve is degenerative. Trivial mitral valve regurgitation. No evidence of mitral stenosis. Moderate mitral annular calcification. 6. The aortic valve is tricuspid. Aortic valve regurgitation is not visualized. Aortic valve sclerosis/calcification is present, without any evidence of aortic stenosis. 7. The inferior vena cava is normal in size with greater than 50% respiratory variability, suggesting right atrial pressure of 3  mmHg.  Comparison(s): Prior images reviewed side by side. Changes from prior study are noted. EF improved compared to prior.  FINDINGS Left Ventricle: Left ventricular ejection fraction, by estimation, is 55 to 60%. The left ventricle has normal function. The left ventricle has no regional wall motion abnormalities. The average left ventricular global longitudinal strain is -20.7 %. Strain was performed and the global longitudinal strain is normal. The left ventricular internal cavity size was normal in size. There is no left ventricular hypertrophy. Left ventricular diastolic parameters were normal.  Right Ventricle: The right ventricular size is normal. No increase in right ventricular wall thickness. Right ventricular systolic function is normal. There is normal pulmonary artery systolic pressure. The tricuspid regurgitant velocity is 2.35 m/s, and with an assumed right atrial pressure of 3 mmHg, the estimated right ventricular systolic pressure is 25.1 mmHg.  Left Atrium: Left atrial size was moderately dilated.  Right Atrium: Right atrial size was normal in size.  Pericardium: A small pericardial effusion is present. The pericardial effusion is posterior to the left ventricle. There is no evidence of cardiac tamponade.  Mitral Valve: The mitral valve is degenerative in appearance. Moderate mitral annular calcification. Trivial mitral valve regurgitation. No evidence of mitral valve stenosis. MV peak gradient, 7.6 mmHg. The mean mitral valve gradient is 2.0 mmHg.  Tricuspid Valve: The tricuspid valve is normal in structure. Tricuspid valve regurgitation is mild . No evidence of tricuspid stenosis.  Aortic Valve: The aortic valve is tricuspid. Aortic valve regurgitation is not visualized. Aortic valve sclerosis/calcification is present, without any evidence of aortic stenosis. Aortic valve mean gradient measures 10.5 mmHg. Aortic valve peak gradient measures 20.7 mmHg. Aortic valve area, by  VTI measures 2.05 cm.  Pulmonic Valve: The pulmonic valve was not well visualized. Pulmonic valve regurgitation is trivial. No evidence of pulmonic stenosis.  Aorta: The aortic root and ascending aorta are structurally normal, with no evidence of dilitation.  Venous: The inferior vena cava is normal in size with greater than 50% respiratory variability, suggesting right atrial pressure of 3 mmHg.  IAS/Shunts: The atrial septum is grossly normal.   LEFT VENTRICLE PLAX 2D LVIDd:         4.60 cm   Diastology LVIDs:         2.80 cm   LV e' medial:    9.79 cm/s LV PW:         1.00 cm   LV E/e' medial:  12.9 LV IVS:        1.00 cm   LV e' lateral:   9.48 cm/s LVOT diam:     1.90 cm   LV E/e' lateral: 13.3 LV SV:         99 LV SV Index:   55        2D Longitudinal Strain LVOT Area:     2.84 cm  2D Strain GLS (A4C):   -21.5 % 2D Strain GLS (A3C):   -20.8 % 2D  Strain GLS (A2C):   -20.0 % 2D Strain GLS Avg:     -20.7 %  3D Volume EF LV 3D EDV:   87.60 ml LV 3D ESV:   31.84 ml  RIGHT VENTRICLE             IVC RV Basal diam:  4.00 cm     IVC diam: 1.00 cm RV S prime:     14.90 cm/s TAPSE (M-mode): 2.8 cm  LEFT ATRIUM             Index LA diam:        3.60 cm 1.99 cm/m LA Vol (A2C):   70.2 ml 38.77 ml/m LA Vol (A4C):   76.3 ml 42.14 ml/m LA Biplane Vol: 74.7 ml 41.26 ml/m AORTIC VALVE AV Area (Vmax):    1.99 cm AV Area (Vmean):   1.99 cm AV Area (VTI):     2.05 cm AV Vmax:           227.50 cm/s AV Vmean:          148.000 cm/s AV VTI:            0.482 m AV Peak Grad:      20.7 mmHg AV Mean Grad:      10.5 mmHg LVOT Vmax:         159.50 cm/s LVOT Vmean:        104.000 cm/s LVOT VTI:          0.349 m LVOT/AV VTI ratio: 0.72  AORTA Ao Asc diam: 2.90 cm  MITRAL VALVE                TRICUSPID VALVE MV Area (PHT): 1.78 cm     TR Peak grad:   22.1 mmHg MV Area VTI:   2.21 cm     TR Vmax:        235.00 cm/s MV Peak grad:  7.6 mmHg MV Mean grad:  2.0 mmHg      SHUNTS MV Vmax:       1.38 m/s     Systemic VTI:  0.35 m MV Vmean:      69.4 cm/s    Systemic Diam: 1.90 cm MV Decel Time: 426 msec MV E velocity: 126.00 cm/s MV A velocity: 63.50 cm/s MV E/A ratio:  1.98  Desiree Bruckner Desiree [name] Electronically signed by Desiree Bruckner Desiree [name] Signature Date/Time: 08/26/2023/8:45:27 PM    Final    MONITORS  LONG TERM MONITOR (3-14 DAYS) 01/18/2023  Narrative Patch Wear Time:  13 days and 23 hours (2024-10-07T16:05:05-398 to 2024-10-21T16:04:55-0400)  Patient had a min HR of 49 bpm, max HR of 99 bpm, and avg HR of 64 bpm. Predominant underlying rhythm was Sinus Rhythm. INo VT, SVT, atrial fibrillation, high degree block, or pauses noted. Isolated ventricular ectopy was rare (<1%), isolated atrial ectopy was occasional (1.5%). There was 1 triggered event, which was sinus. No high risk arrhythmias detected.       ______________________________________________________________________________________________      Risk Assessment/Calculations  CHA2DS2-VASc Score = 4   This indicates a 4.8% annual risk of stroke. The patient's score is based upon: CHF History: 1 HTN History: 1 Diabetes History: 0 Stroke History: 0 Vascular Disease History: 1 Age Score: 0 Gender Score: 1            Physical Exam VS:  BP (!) 100/56 (BP Location: Right Arm, Patient Position: Sitting, Cuff Size: Normal)   Pulse 77   Resp 16   Ht 5'  4 (1.626 m)   Wt 158 lb 8 oz (71.9 kg)   SpO2 96%   BMI 27.21 kg/m        Wt Readings from Last 3 Encounters:  09/21/23 158 lb 8 oz (71.9 kg)  08/27/23 156 lb 12 oz (71.1 kg)  05/10/23 147 lb 11.3 oz (67 kg)    GEN: Well nourished, well developed in no acute distress NECK: No JVD; No carotid bruits CARDIAC: RRR, no murmurs, rubs, gallops RESPIRATORY:  Clear to auscultation without rales, wheezing or rhonchi  ABDOMEN: Soft, non-tender, non-distended EXTREMITIES:  No edema; No deformity   ASSESSMENT AND  PLAN  PAF/history of arterial embolism (coronaries)/hypercoagulable state/ On Amiodarone  therapy - Describes bubbles in her chest that comes and goes. She reports uncertain if it is gas or palpitations but she and her significant other are very concerned. 7 day ZIO placed in clinic. CHA2DS2-VASc Score = 4 [CHF History: 1, HTN History: 1, Diabetes History: 0, Stroke History: 0, Vascular Disease History: 1, Age Score: 0, Gender Score: 1].  Therefore, the patient's annual risk of stroke is 4.8 %.    Continue Eliquis  5mg  BID. Does note meet dose reduction criteria.  Amiodarone  monitoring: 10/2022 TSH 2.455, 08/26/23 ALT 26, AST 21. Due for TSH with next labs  Systolic heart failure-echo 08/26/2023 LVEF 55 to 60%, RV normal, moderate LAE, small pericardial effusion with no tamponade, trivial MR. Volume management per HD. GDMT titration has been limited by hypotension.   ESRD on HD/hypotension with HD- managed by nephrology   CAD s/p STEMI 08/2018/HLD, LDL goal less than 70 - Present chest pain at rest, reviewed atypical for angina. Her significant other is very concerned about chest discomfort. Reassuranc eprovied and encouraged to follow up with GI as suspect her chest pain is GI related as improves with Carafate. 08/27/23 LDL 41. GDMT Plavix  75mg  daily, Atorvastatin  80mg  daily. No beta blocker due to hypotension. Anti anginal therapy limited by hypotension.        Dispo: follow up as scheduled with Dr. Lonni or APP  Signed, Reche GORMAN Finder, NP

## 2023-10-07 NOTE — Progress Notes (Signed)
 No show

## 2023-10-21 ENCOUNTER — Other Ambulatory Visit: Payer: Self-pay

## 2023-10-21 ENCOUNTER — Emergency Department (HOSPITAL_COMMUNITY)

## 2023-10-21 ENCOUNTER — Encounter (HOSPITAL_COMMUNITY): Payer: Self-pay | Admitting: Emergency Medicine

## 2023-10-21 ENCOUNTER — Observation Stay (HOSPITAL_COMMUNITY)
Admission: EM | Admit: 2023-10-21 | Discharge: 2023-10-22 | Disposition: A | Discharge status: Home / Self Care | Attending: Family Medicine | Admitting: Family Medicine

## 2023-10-21 DIAGNOSIS — I2510 Atherosclerotic heart disease of native coronary artery without angina pectoris: Secondary | ICD-10-CM | POA: Diagnosis not present

## 2023-10-21 DIAGNOSIS — E876 Hypokalemia: Secondary | ICD-10-CM

## 2023-10-21 DIAGNOSIS — F14920 Cocaine use, unspecified with intoxication, uncomplicated: Secondary | ICD-10-CM | POA: Diagnosis not present

## 2023-10-21 DIAGNOSIS — R0789 Other chest pain: Principal | ICD-10-CM | POA: Diagnosis present

## 2023-10-21 DIAGNOSIS — Z992 Dependence on renal dialysis: Secondary | ICD-10-CM | POA: Diagnosis not present

## 2023-10-21 DIAGNOSIS — N186 End stage renal disease: Secondary | ICD-10-CM | POA: Diagnosis not present

## 2023-10-21 DIAGNOSIS — I5022 Chronic systolic (congestive) heart failure: Secondary | ICD-10-CM | POA: Diagnosis not present

## 2023-10-21 DIAGNOSIS — J9610 Chronic respiratory failure, unspecified whether with hypoxia or hypercapnia: Secondary | ICD-10-CM | POA: Insufficient documentation

## 2023-10-21 DIAGNOSIS — J45909 Unspecified asthma, uncomplicated: Secondary | ICD-10-CM | POA: Insufficient documentation

## 2023-10-21 DIAGNOSIS — R079 Chest pain, unspecified: Secondary | ICD-10-CM | POA: Diagnosis present

## 2023-10-21 DIAGNOSIS — I959 Hypotension, unspecified: Secondary | ICD-10-CM

## 2023-10-21 DIAGNOSIS — G40909 Epilepsy, unspecified, not intractable, without status epilepticus: Secondary | ICD-10-CM | POA: Insufficient documentation

## 2023-10-21 DIAGNOSIS — I132 Hypertensive heart and chronic kidney disease with heart failure and with stage 5 chronic kidney disease, or end stage renal disease: Secondary | ICD-10-CM | POA: Diagnosis not present

## 2023-10-21 DIAGNOSIS — D649 Anemia, unspecified: Secondary | ICD-10-CM | POA: Diagnosis not present

## 2023-10-21 LAB — BASIC METABOLIC PANEL WITH GFR
Anion gap: 11 (ref 5–15)
BUN: 16 mg/dL (ref 6–20)
CO2: 20 mmol/L — ABNORMAL LOW (ref 22–32)
Calcium: 5.9 mg/dL — CL (ref 8.9–10.3)
Chloride: 112 mmol/L — ABNORMAL HIGH (ref 98–111)
Creatinine, Ser: 3.91 mg/dL — ABNORMAL HIGH (ref 0.44–1.00)
GFR, Estimated: 14 mL/min — ABNORMAL LOW (ref >60)
Glucose, Bld: 59 mg/dL — ABNORMAL LOW (ref 70–99)
Potassium: 2.6 mmol/L — CL (ref 3.5–5.1)
Sodium: 143 mmol/L (ref 135–145)

## 2023-10-21 LAB — CBC
HCT: 29.4 % — ABNORMAL LOW (ref 36.0–46.0)
Hemoglobin: 9 g/dL — ABNORMAL LOW (ref 12.0–15.0)
MCH: 28.7 pg (ref 26.0–34.0)
MCHC: 30.6 g/dL (ref 30.0–36.0)
MCV: 93.6 fL (ref 80.0–100.0)
Platelets: 154 K/uL (ref 150–400)
RBC: 3.14 MIL/uL — ABNORMAL LOW (ref 3.87–5.11)
RDW: 16.9 % — ABNORMAL HIGH (ref 11.5–15.5)
WBC: 4.6 K/uL (ref 4.0–10.5)
nRBC: 0 % (ref 0.0–0.2)

## 2023-10-21 LAB — TYPE AND SCREEN
ABO/RH(D): B NEG
Antibody Screen: NEGATIVE

## 2023-10-21 LAB — TROPONIN I (HIGH SENSITIVITY)
Troponin I (High Sensitivity): 9 ng/L (ref <18)
Troponin I (High Sensitivity): 13 ng/L (ref <18)

## 2023-10-21 LAB — HEPATIC FUNCTION PANEL
ALT: 18 U/L (ref 0–44)
AST: 19 U/L (ref 15–41)
Albumin: 3.1 g/dL — ABNORMAL LOW (ref 3.5–5.0)
Alkaline Phosphatase: 172 U/L — ABNORMAL HIGH (ref 38–126)
Bilirubin, Direct: <0.1 mg/dL (ref 0.0–0.2)
Total Bilirubin: 0.5 mg/dL (ref 0.0–1.2)
Total Protein: 7.3 g/dL (ref 6.5–8.1)

## 2023-10-21 LAB — HCG, SERUM, QUALITATIVE: Preg, Serum: NEGATIVE

## 2023-10-21 LAB — HIV ANTIBODY (ROUTINE TESTING W REFLEX): HIV Screen 4th Generation wRfx: NONREACTIVE

## 2023-10-21 LAB — MAGNESIUM: Magnesium: 2 mg/dL (ref 1.7–2.4)

## 2023-10-21 MED ORDER — ACETAMINOPHEN 650 MG RE SUPP: 650.0000 mg | Freq: Four times a day (QID) | RECTAL | Status: DC | PRN

## 2023-10-21 MED ORDER — APIXABAN 5 MG PO TABS
5.0000 mg | ORAL_TABLET | Freq: Two times a day (BID) | ORAL | Status: DC
  Administered 2023-10-22: 5 mg via ORAL
  Filled 2023-10-21 (×2): qty 1

## 2023-10-21 MED ORDER — MIDODRINE HCL 5 MG PO TABS
10.0000 mg | ORAL_TABLET | ORAL | Status: AC
  Administered 2023-10-21: 10 mg via ORAL
  Filled 2023-10-21: qty 2

## 2023-10-21 MED ORDER — SODIUM CHLORIDE 0.9 % IV BOLUS
500.0000 mL | Freq: Once | INTRAVENOUS | Status: AC
  Administered 2023-10-21: 500 mL via INTRAVENOUS

## 2023-10-21 MED ORDER — CLOPIDOGREL BISULFATE 75 MG PO TABS
75.0000 mg | ORAL_TABLET | Freq: Every day | ORAL | Status: DC
  Administered 2023-10-22: 75 mg via ORAL
  Filled 2023-10-21: qty 1

## 2023-10-21 MED ORDER — POTASSIUM CHLORIDE 10 MEQ/100ML IV SOLN
10.0000 meq | INTRAVENOUS | Status: AC
  Administered 2023-10-21 (×2): 10 meq via INTRAVENOUS
  Filled 2023-10-21 (×2): qty 100

## 2023-10-21 MED ORDER — ATORVASTATIN CALCIUM 80 MG PO TABS
80.0000 mg | ORAL_TABLET | Freq: Every day | ORAL | Status: DC
  Administered 2023-10-22: 80 mg via ORAL
  Filled 2023-10-21: qty 2
  Filled 2023-10-21: qty 1

## 2023-10-21 MED ORDER — FENTANYL CITRATE PF 50 MCG/ML IJ SOSY
50.0000 ug | PREFILLED_SYRINGE | Freq: Once | INTRAMUSCULAR | Status: AC
  Administered 2023-10-21: 50 ug via INTRAVENOUS
  Filled 2023-10-21: qty 1

## 2023-10-21 MED ORDER — POTASSIUM CHLORIDE CRYS ER 20 MEQ PO TBCR: 60.0000 meq | EXTENDED_RELEASE_TABLET | Freq: Once | ORAL | Status: DC

## 2023-10-21 MED ORDER — ALUM & MAG HYDROXIDE-SIMETH 200-200-20 MG/5ML PO SUSP
30.0000 mL | Freq: Once | ORAL | Status: AC
  Administered 2023-10-21: 30 mL via ORAL
  Filled 2023-10-21: qty 30

## 2023-10-21 MED ORDER — ALBUTEROL SULFATE (2.5 MG/3ML) 0.083% IN NEBU: 2.5000 mg | INHALATION_SOLUTION | Freq: Four times a day (QID) | RESPIRATORY_TRACT | Status: DC | PRN

## 2023-10-21 MED ORDER — FAMOTIDINE IN NACL 20-0.9 MG/50ML-% IV SOLN
20.0000 mg | Freq: Once | INTRAVENOUS | Status: AC
  Administered 2023-10-21: 20 mg via INTRAVENOUS
  Filled 2023-10-21: qty 50

## 2023-10-21 MED ORDER — ACETAMINOPHEN 325 MG PO TABS: 650.0000 mg | ORAL_TABLET | Freq: Four times a day (QID) | ORAL | Status: DC | PRN

## 2023-10-21 MED ORDER — ALBUMIN HUMAN 25 % IV SOLN
25.0000 g | Freq: Once | INTRAVENOUS | Status: AC
  Administered 2023-10-21: 25 g via INTRAVENOUS
  Filled 2023-10-21: qty 100

## 2023-10-21 MED ORDER — CALCIUM GLUCONATE-NACL 1-0.675 GM/50ML-% IV SOLN
1.0000 g | Freq: Once | INTRAVENOUS | Status: AC
  Administered 2023-10-21: 1000 mg via INTRAVENOUS
  Filled 2023-10-21: qty 50

## 2023-10-21 MED ORDER — DIPHENHYDRAMINE HCL 50 MG/ML IJ SOLN
12.5000 mg | Freq: Once | INTRAMUSCULAR | Status: AC
  Administered 2023-10-21: 12.5 mg via INTRAVENOUS
  Filled 2023-10-21: qty 1

## 2023-10-21 MED ORDER — CINACALCET HCL 30 MG PO TABS: 90.0000 mg | ORAL_TABLET | ORAL | Status: DC

## 2023-10-21 MED ORDER — ACETAMINOPHEN 325 MG PO TABS
650.0000 mg | ORAL_TABLET | Freq: Once | ORAL | Status: AC
  Administered 2023-10-21: 650 mg via ORAL
  Filled 2023-10-21: qty 2

## 2023-10-21 MED ORDER — SEVELAMER CARBONATE 800 MG PO TABS: 1600.0000 mg | ORAL_TABLET | Freq: Three times a day (TID) | ORAL | Status: DC

## 2023-10-21 MED ORDER — KETOTIFEN FUMARATE 0.035 % OP SOLN: 1.0000 [drp] | Freq: Two times a day (BID) | OPHTHALMIC | Status: DC | PRN

## 2023-10-21 MED ORDER — LIDOCAINE VISCOUS HCL 2 % MT SOLN
15.0000 mL | Freq: Once | OROMUCOSAL | Status: AC
  Administered 2023-10-21: 15 mL via ORAL
  Filled 2023-10-21: qty 15

## 2023-10-21 MED ORDER — POTASSIUM CHLORIDE CRYS ER 20 MEQ PO TBCR
40.0000 meq | EXTENDED_RELEASE_TABLET | Freq: Once | ORAL | Status: AC
  Administered 2023-10-21: 40 meq via ORAL
  Filled 2023-10-21: qty 2

## 2023-10-21 MED ORDER — SODIUM CHLORIDE 0.9% FLUSH
3.0000 mL | Freq: Two times a day (BID) | INTRAVENOUS | Status: DC
  Administered 2023-10-21 – 2023-10-22 (×2): 3 mL via INTRAVENOUS

## 2023-10-21 MED ORDER — PROCHLORPERAZINE EDISYLATE 10 MG/2ML IJ SOLN
5.0000 mg | INTRAMUSCULAR | Status: AC
  Administered 2023-10-21: 5 mg via INTRAVENOUS
  Filled 2023-10-21: qty 2

## 2023-10-21 MED ORDER — PANTOPRAZOLE SODIUM 40 MG IV SOLR
40.0000 mg | Freq: Two times a day (BID) | INTRAVENOUS | Status: DC
  Administered 2023-10-21 (×2): 40 mg via INTRAVENOUS
  Filled 2023-10-21 (×3): qty 10

## 2023-10-21 MED ORDER — MIDODRINE HCL 5 MG PO TABS
10.0000 mg | ORAL_TABLET | Freq: Three times a day (TID) | ORAL | Status: DC
  Administered 2023-10-21 – 2023-10-22 (×2): 10 mg via ORAL
  Filled 2023-10-21 (×3): qty 2

## 2023-10-21 MED ORDER — SUCRALFATE 1 GM/10ML PO SUSP
1.0000 g | Freq: Three times a day (TID) | ORAL | Status: DC
  Administered 2023-10-21 – 2023-10-22 (×2): 1 g via ORAL
  Filled 2023-10-21 (×3): qty 10

## 2023-10-21 MED ORDER — FLUTICASONE FUROATE-VILANTEROL 100-25 MCG/ACT IN AEPB
1.0000 | INHALATION_SPRAY | Freq: Every day | RESPIRATORY_TRACT | Status: DC
  Administered 2023-10-22: 1 via RESPIRATORY_TRACT
  Filled 2023-10-21: qty 28

## 2023-10-21 NOTE — ED Notes (Signed)
 Pt actively throwing up. EDP notified

## 2023-10-21 NOTE — H&P (Signed)
 History and Physical    Patient: Desiree [name] FMW:[id number] DOB: [birth year] DOA: 10/21/2023 DOS: the patient was seen and examined on 10/21/2023 . PCP: Trudy Vaughan HERO, MD  Patient coming from: Home Chief complaint: Chief Complaint  Patient presents with   Chest Pain   HPI:  Desiree [name] is a 48 y.o. female with past medical history  of   ESRD on HD T/TH/S patient does not make any urine, hypertension, seizures, migraines, chronic respiratory failure on 3 L home O2, CAD s/p Underwent LHC 09/13/2022 with finding suspected be thromboembolic event to coronaries, likely from previously undetected atrial fibrillation. Due to occlusion of left distal OM 3 and apical LAD. Angioplasty done to both areas but difficult to restore significant flow. Pt is admitted today after her discharge for similar presentation of chest pain. Pt completed her HD session today and on way noted her chest was hurting she was dizzy and felt atypical chest symptoms that she had when she had her stemi.  Pt states he bp when they called EMS was 62 systolic. Also her AVF was bleeding , and edmd glued it and bleeding has stopped.    ED Course:  Vital signs in the ED were notable for the following:  Vitals:   10/21/23 1345 10/21/23 1400 10/21/23 1500 10/21/23 1545  BP: 103/88 104/64  (!) 98/54  Pulse:  60    Temp:   (!) 97.5 F (36.4 C)   Resp: 17 18  (!) 23  Height:      Weight:      SpO2:  98%    BMI (Calculated):      >>ED evaluation thus far shows: BMP shows potassium of 2.6, and chloride of 112 and bicarb of 20 and 59 and creatinine 3.91, calcium  of 5.9. EKG shows sinus rhythm 66 PR 123 QRS of 95 QTc prolonged at 506, QRS of 95. TNI of 9.  Lft ordered and pending. Mag ordered and pending.  CBC shows chronic anemia with hb of 9.0 normal wbc and normal platelet.  >>While in the ED patient received the following: Medications  famotidine  (PEPCID ) IVPB 20 mg premix (20 mg Intravenous New Bag/Given 10/21/23  1411)  potassium chloride  10 mEq in 100 mL IVPB (10 mEq Intravenous New Bag/Given 10/21/23 1408)  calcium  gluconate 1 g/ 50 mL sodium chloride  IVPB (1,000 mg Intravenous New Bag/Given 10/21/23 1410)  pantoprazole  (PROTONIX ) injection 40 mg (has no administration in time range)  midodrine  (PROAMATINE ) tablet 10 mg (10 mg Oral Given 10/21/23 1124)  sodium chloride  0.9 % bolus 500 mL (500 mLs Intravenous New Bag/Given 10/21/23 1216)  fentaNYL  (SUBLIMAZE ) injection 50 mcg (50 mcg Intravenous Given 10/21/23 1125)  alum & mag hydroxide-simeth (MAALOX/MYLANTA) 200-200-20 MG/5ML suspension 30 mL (30 mLs Oral Given 10/21/23 1124)    And  lidocaine  (XYLOCAINE ) 2 % viscous mouth solution 15 mL (15 mLs Oral Given 10/21/23 1124)  fentaNYL  (SUBLIMAZE ) injection 50 mcg (50 mcg Intravenous Given 10/21/23 1411)  potassium chloride  SA (KLOR-CON  M) CR tablet 40 mEq (40 mEq Oral Given 10/21/23 1412)   Review of Systems  Constitutional:  Positive for chills and malaise/fatigue.  Cardiovascular:  Positive for chest pain.  Neurological:  Positive for dizziness and weakness.   Past Medical History:  Diagnosis Date   Anemia    Anxiety    panic attacks   Arthritis    bilateral knees   Childhood asthma    Complication of anesthesia    sometimes it does not work; didn't  during LEEP OR (01/21/2016)   Depression    no med   ESRD (end stage renal disease) on dialysis (HCC)    TTS; [facility] [place] (08/03/2016)   GERD (gastroesophageal reflux disease)    nexium prn   Gout    History of blood transfusion    related to kidneys; I've had 4 (01/21/2016)   Hypertension    Hypertensive urgency 12/05/2021   Migraine    last one 01/18/19   Myocardial infarction [facility]) 08/2022   Preterm labor ~ 2014   Retina hole, left    Seizures (HCC)    last one was in 2000; related to preeclampsia (01/21/2016)   Past Surgical History:  Procedure Laterality Date   A/V FISTULAGRAM Left 12/19/2021   Procedure: A/V Fistulagram;   Surgeon: Eliza Lonni RAMAN, MD;  Location: [facility] INVASIVE CV LAB;  Service: Cardiovascular;  Laterality: Left;   A/V FISTULAGRAM N/A 04/02/2023   Procedure: A/V Fistulagram;  Surgeon: Tobie Gordy POUR, MD;  Location: [facility] INVASIVE CV LAB;  Service: Cardiovascular;  Laterality: N/A;   AV FISTULA PLACEMENT Left 2010   AV FISTULA PLACEMENT Left 01/20/2019   Procedure: ARTERIOVENOUS (AV) FISTULA CREATION LEFT UPPER ARM;  Surgeon: Eliza Lonni RAMAN, MD;  Location: [facility] OR;  Service: Vascular;  Laterality: Left;   AV FISTULA PLACEMENT Left 12/19/2021   Procedure: ARTERIOVENOUS (AV) FISTULA CREATION POSSIBLE TUNNELED DIALYSIS;  Surgeon: Sheree Penne Lonni, MD;  Location: [facility] OR;  Service: Vascular;  Laterality: Left;   CERVICAL BIOPSY  W/ LOOP ELECTRODE EXCISION  2001   CORONARY/GRAFT ACUTE MI REVASCULARIZATION N/A 09/12/2022   Procedure: Coronary/Graft Acute MI Revascularization;  Surgeon: Dann Candyce RAMAN, MD;  Location: [facility] INVASIVE CV LAB;  Service: Cardiovascular;  Laterality: N/A;   DILATION AND EVACUATION  08/02/2011   Procedure: DILATATION AND EVACUATION;  Surgeon: Nathanel LELON Bunker, MD;  Location: [facility] ORS;  Service: Gynecology;;   DILATION AND EVACUATION N/A 08/31/2013   Procedure: DILATATION AND EVACUATION;  Surgeon: Lynwood KANDICE Solomons, MD;  Location: [facility] ORS;  Service: Gynecology;  Laterality: N/A;   FISTULA SUPERFICIALIZATION Left 01/20/2019   Procedure: Fistula Superficialization;  Surgeon: Eliza Lonni RAMAN, MD;  Location: [facility] OR;  Service: Vascular;  Laterality: Left;   HYDRADENITIS EXCISION Right    INSERTION OF DIALYSIS CATHETER Right 12/19/2021   Procedure: INSERTION OF A TUNNELED DIALYSIS CATHETER;  Surgeon: Sheree Penne Lonni, MD;  Location: [facility] OR;  Service: Vascular;  Laterality: Right;   LEFT HEART CATH AND CORONARY ANGIOGRAPHY N/A 09/12/2022   Procedure: LEFT HEART CATH AND CORONARY ANGIOGRAPHY;  Surgeon: Dann Candyce RAMAN, MD;  Location: [facility] INVASIVE CV LAB;  Service:  Cardiovascular;  Laterality: N/A;   RENAL BIOPSY     REVISION OF ARTERIOVENOUS GORETEX GRAFT Left 02/11/2013   Procedure: REVISION OF ARTERIOVENOUS GORTEX FISTULA;  Surgeon: Krystal JULIANNA Doing, MD;  Location: [facility] OR;  Service: Vascular;  Laterality: Left;   THROMBECTOMY W/ EMBOLECTOMY Left 05/25/2018   Procedure: REPAIR OF BLEEDING ARTERIOVENOUS FISTULA;  Surgeon: Serene Gaile LELON, MD;  Location: [facility] OR;  Service: Vascular;  Laterality: Left;    reports that she has quit smoking. Her smoking use included cigarettes. She has been exposed to tobacco smoke. She has never used smokeless tobacco. She reports current drug use. Drug: Marijuana. She reports that she does not drink alcohol . Allergies  Allergen Reactions   Iron  Sucrose Itching and Other (See Comments)    Hot flashes Headaches    Ms Contin  [Morphine ] Anaphylaxis and Shortness Of Breath  Prednisone Other (See Comments)    Muscle spasms Muscle cramping   Richelle [Fish Allergy] Itching, Swelling, Rash and Other (See Comments)    Facial drooping   Iodinated Contrast Media Itching   Norvasc [Amlodipine] Swelling    Angioedema    Tape Itching   Family History  Problem Relation Age of Onset   Diabetes Mother    Hyperlipidemia Mother    Hypertension Mother    Heart disease Mother    Hypertension Other    Diabetes type II Other    Prior to Admission medications   Medication Sig Start Date End Date Taking? Authorizing Provider  ALPRAZolam  (XANAX ) 1 MG tablet Take 1 tablet (1 mg total) by mouth 3 (three) times daily as needed for anxiety. Take 30 minutes prior to MRI Patient taking differently: Take 1 mg by mouth 3 (three) times daily. Take 30 minutes prior to MRI 11/17/22   Persons, Ronal Dragon, PA  amiodarone  (PACERONE ) 200 MG tablet Take 1 tablet (200 mg total) by mouth daily. 12/21/22 09/21/23  Vannie Reche RAMAN, NP  apixaban  (ELIQUIS ) 5 MG TABS tablet Take 1 tablet (5 mg total) by mouth 2 (two) times daily. 09/28/22   Vannie Reche RAMAN, NP   atorvastatin  (LIPITOR) 80 MG tablet TAKE 1 TABLET(80 MG) BY MOUTH DAILY 09/20/23   Lonni Slain, MD  carboxymethylcellulose (REFRESH PLUS) 0.5 % SOLN Place 2 drops into both eyes daily.    [provider]  cinacalcet  (SENSIPAR ) 60 MG tablet Take 60 mg by mouth every dialysis (Dialysis: Tuesday, Thursday, and Saturday).    [provider]  clopidogrel  (PLAVIX ) 75 MG tablet Take 1 tablet (75 mg total) by mouth daily with breakfast. 09/17/22   Vicci Rollo SAUNDERS, PA-C  cyclobenzaprine  (FLEXERIL ) 10 MG tablet Take 10 mg by mouth 2 (two) times daily as needed for muscle spasms.    [provider]  fluticasone  furoate-vilanterol (BREO ELLIPTA ) 100-25 MCG/ACT AEPB Inhale 1 puff into the lungs daily. 04/30/22   Jude Harden GAILS, MD  folic acid -vitamin b complex-vitamin c-selenium-zinc (DIALYVITE) 3 MG TABS tablet Take 1 tablet by mouth in the morning. Chew tablets    [provider]  guaiFENesin -dextromethorphan (ROBITUSSIN DM) 100-10 MG/5ML syrup Take 10 mLs by mouth every 4 (four) hours as needed for cough. Patient not taking: Reported on 09/21/2023 05/10/23   Cheryle Page, MD  lactulose  (CHRONULAC ) 10 GM/15ML solution Take 30 mLs by mouth daily as needed for mild constipation, moderate constipation or severe constipation. 09/04/21   [provider]  midodrine  (PROAMATINE ) 10 MG tablet Take 10 mg by mouth 3 (three) times daily as needed (Hypotension). 09/03/22   [provider]  NARCAN  4 MG/0.1ML LIQD nasal spray kit Place 1 spray into the nose as needed (accidental overdose). Patient not taking: Reported on 09/21/2023 11/14/18   [provider]  nystatin cream (MYCOSTATIN) Apply 1 Application topically daily. Apply to recurrent boils on buttocks    [provider]  ondansetron  (ZOFRAN -ODT) 8 MG disintegrating tablet Take 16 mg by mouth daily as needed for nausea or refractory nausea / vomiting.    [provider]   oxyCODONE -acetaminophen  (PERCOCET) 10-325 MG tablet Take 1 tablet by mouth every 6 (six) hours as needed. 04/06/23   [provider]  pantoprazole  (PROTONIX ) 40 MG tablet Take 1 tablet (40 mg total) by mouth daily. 09/28/22 10/14/23  Walker, Caitlin S, NP  sevelamer  carbonate (RENVELA ) 800 MG tablet Take 800 mg by mouth 3 (three) times daily with meals.  [provider]                                                                                 Vitals:   10/21/23 1345 10/21/23 1400 10/21/23 1500 10/21/23 1545  BP: 103/88 104/64  (!) 98/54  Pulse:  60    Resp: 17 18  (!) 23  Temp:   (!) 97.5 F (36.4 C)   SpO2:  98%    Weight:      Height:       Physical Exam Vitals reviewed.  Constitutional:      General: She is not in acute distress.    Appearance: She is ill-appearing.  HENT:     Head: Normocephalic and atraumatic.  Eyes:     Extraocular Movements: Extraocular movements intact.  Cardiovascular:     Rate and Rhythm: Normal rate and regular rhythm.     Heart sounds: Normal heart sounds.  Pulmonary:     Effort: Pulmonary effort is normal.     Breath sounds: Normal breath sounds.  Abdominal:     General: There is no distension.     Palpations: Abdomen is soft.     Tenderness: There is no abdominal tenderness.  Musculoskeletal:     Right lower leg: No edema.     Left lower leg: No edema.  Neurological:     General: No focal deficit present.     Mental Status: She is alert and oriented to person, place, and time.     Labs on Admission: I have personally reviewed following labs and imaging studies CBC: Recent Labs  Lab 10/21/23 1112  WBC 4.6  HGB 9.0*  HCT 29.4*  MCV 93.6  PLT 154   Basic Metabolic Panel: Recent Labs  Lab 10/21/23 1112 10/21/23 1414  NA 143  --   K 2.6*  --   CL 112*  --   CO2 20*  --   GLUCOSE 59*  --   BUN 16  --   CREATININE 3.91*  --   CALCIUM  5.9*  --   MG  --  2.0   GFR: Estimated Creatinine Clearance: 17.2  mL/min (A) (by C-G formula based on SCr of 3.91 mg/dL (H)). Liver Function Tests: Recent Labs  Lab 10/21/23 1414  AST 19  ALT 18  ALKPHOS 172*  BILITOT 0.5  PROT 7.3  ALBUMIN  3.1*   No results for input(s): LIPASE, AMYLASE in the last 168 hours. No results for input(s): AMMONIA in the last 168 hours. Recent Labs    11/03/22 1612 11/05/22 0154 05/08/23 1403 05/09/23 0413 05/10/23 0440 08/24/23 2059 08/26/23 0353 08/27/23 0324 10/21/23 1112  BUN 13 26* 74* 83* 51* 32* 52* 32* 16  CREATININE 5.38* 8.03* 16.06* 16.89* 10.33* 7.80* 11.11* 7.95* 3.91*    Estimated Creatinine Clearance: 17.2 mL/min (A) (by C-G formula based on SCr of 3.91 mg/dL (H)).   Recent Labs    11/03/22 1612 11/05/22 0154 05/08/23 1403 05/09/23 0413 05/10/23 0440 08/24/23 2059 08/26/23 0353 08/27/23 0324 10/21/23 1112  BUN 13 26* 74* 83* 51* 32* 52* 32* 16  CREATININE 5.38* 8.03* 16.06* 16.89* 10.33* 7.80* 11.11* 7.95* 3.91*  CO2 31 28 24  22 28 28 26 29  20*   Cardiac Enzymes: No results for input(s): CKTOTAL, CKMB, CKMBINDEX, TROPONINI in the last 168 hours. BNP (last 3 results) No results for input(s): PROBNP in the last 8760 hours. HbA1C: No results for input(s): HGBA1C in the last 72 hours. CBG: No results for input(s): GLUCAP in the last 168 hours. Lipid Profile: No results for input(s): CHOL, HDL, LDLCALC, TRIG, CHOLHDL, LDLDIRECT in the last 72 hours. Thyroid Function Tests: No results for input(s): TSH, T4TOTAL, FREET4, T3FREE, THYROIDAB in the last 72 hours. Anemia Panel: No results for input(s): VITAMINB12, FOLATE, FERRITIN, TIBC, IRON , RETICCTPCT in the last 72 hours. Urine analysis:    Component Value Date/Time   COLORURINE YELLOW 12/20/2013 0530   APPEARANCEUR CLOUDY (A) 12/20/2013 0530   LABSPEC 1.009 12/20/2013 0530   PHURINE 8.0 12/20/2013 0530   GLUCOSEU 100 (A) 12/20/2013 0530   HGBUR LARGE (A) 12/20/2013 0530    BILIRUBINUR NEGATIVE 12/20/2013 0530   KETONESUR NEGATIVE 12/20/2013 0530   PROTEINUR 100 (A) 12/20/2013 0530   UROBILINOGEN 0.2 12/20/2013 0530   NITRITE NEGATIVE 12/20/2013 0530   LEUKOCYTESUR TRACE (A) 12/20/2013 0530   Radiological Exams on Admission: DG Chest 2 View Result Date: 10/21/2023 CLINICAL DATA:  Chest pain.  Previous myocardial infarct. EXAM: CHEST - 2 VIEW COMPARISON:  08/24/2023 FINDINGS: The heart size and mediastinal contours are within normal limits. Both lungs are clear. The visualized skeletal structures are unremarkable. IMPRESSION: No active cardiopulmonary disease. Electronically Signed   By: Norleen DELENA Kil M.D.   On: 10/21/2023 12:10   Data Reviewed: Relevant notes from primary care and specialist visits, past discharge summaries as available in EHR, including Care Everywhere . Prior diagnostic testing as pertinent to current admission diagnoses, Updated medications and problem lists for reconciliation .ED course, including vitals, labs, imaging, treatment and response to treatment,Triage notes, nursing and pharmacy notes and ED provider's notes.Notable results as noted in HPI.Discussed case with EDMD/ ED APP/ or Specialty MD on call and as needed.  Assessment & Plan  >> Atypical chest pain/ CAD: Negative troponin of 9,  EKG shows sinus rhythm 66 PR 123 QRS of 95 QTc prolonged at 506, QRS of 95. No chest pain at bedside. I suspect pt is having chest pain from intermittent increase demand from her low blood pressures causing intermittent anginal symptoms will get cardiology.  Pt is on eliquis  and Plavix , atorvastatin .  Gi is also a consideration in light of her anemia will start pt on IV PPI and consult GI.   >>Hypotension: Vitals:   10/21/23 1100 10/21/23 1115 10/21/23 1135 10/21/23 1145  BP: (!) 89/53 (!) 87/55 (!) 93/55 (!) 86/50   10/21/23 1200 10/21/23 1215 10/21/23 1300 10/21/23 1315  BP: (!) 98/59 (!) 85/54 (!) 94/54 (!) 84/52   10/21/23 1330 10/21/23 1345  10/21/23 1400 10/21/23 1545  BP: (!) 92/49 103/88 104/64 (!) 98/54  Pt has chronic hypotension and we will give albumin  x 1 and cont midodrine .    >>ESRD HD T/TH/Sat: Nephrology consult per am team.Pt has HD full session today.  Hold sevelamer , and Cinacalcet  due to hypocalcemia.    >>PAF:  Continue pt on eliquis . Amiodarone  is currently held due to prolonged QTc more so than the previous EKG her current QT interval is 506.  Also have requested cardiology to consult on this patient.  CHA2DS2/VAS Stroke Risk Points  Current as of 18 minutes ago     6 >= 2 Points: High Risk       >>  Anemia: 2/2 to combination of ACD and will need to rule out gi losses.  Will request GI consult.  Type/screen IV PPI. Occult.   >> Chronic congestive heart failure with reduced ejection fraction: Currently patient is dry/dehydrated appearing, volume management with hemodialysis.    >> History of cocaine  abuse: Patient denies any current drug or alcohol .  >> Asthma: Will continue patient on her Breo Ellipta .  As needed albuterol .    DVT prophylaxis:  Eliquis .  Consults:  Cardiology GI  Advance Care Planning:    Code Status: Full Code   Family Communication:  None Disposition Plan:  Home Severity of Illness: The appropriate patient status for this patient is INPATIENT. Inpatient status is judged to be reasonable and necessary in order to provide the required intensity of service to ensure the patient's safety. The patient's presenting symptoms, physical exam findings, and initial radiographic and laboratory data in the context of their chronic comorbidities is felt to place them at high risk for further clinical deterioration. Furthermore, it is not anticipated that the patient will be medically stable for discharge from the hospital within 2 midnights of admission.   * I certify that at the point of admission it is my clinical judgment that the patient will require inpatient hospital care  spanning beyond 2 midnights from the point of admission due to high intensity of service, high risk for further deterioration and high frequency of surveillance required.*  Unresulted Labs (From admission, onward)     Start     Ordered   10/22/23 0500  Comprehensive metabolic panel  Tomorrow morning,   R        10/21/23 1643   10/22/23 0500  CBC  Tomorrow morning,   R        10/21/23 1643   10/21/23 1640  HIV Antibody (routine testing w rflx)  (HIV Antibody (Routine testing w reflex) panel)  Once,   R        10/21/23 1643   10/21/23 1400  Culture, blood (Routine X 2) w Reflex to ID Panel  BLOOD CULTURE X 2,   R      10/21/23 1359   10/21/23 1400  Rapid urine drug screen (hospital performed)  ONCE - STAT,   STAT        10/21/23 1359   Unscheduled  Occult blood card to lab, stool  As needed,   R      10/21/23 1553            Meds ordered this encounter  Medications   midodrine  (PROAMATINE ) tablet 10 mg   sodium chloride  0.9 % bolus 500 mL   fentaNYL  (SUBLIMAZE ) injection 50 mcg   AND Linked Order Group    alum & mag hydroxide-simeth (MAALOX/MYLANTA) 200-200-20 MG/5ML suspension 30 mL    lidocaine  (XYLOCAINE ) 2 % viscous mouth solution 15 mL   famotidine  (PEPCID ) IVPB 20 mg premix   fentaNYL  (SUBLIMAZE ) injection 50 mcg   potassium chloride  10 mEq in 100 mL IVPB   DISCONTD: potassium chloride  SA (KLOR-CON  M) CR tablet 60 mEq   calcium  gluconate 1 g/ 50 mL sodium chloride  IVPB   potassium chloride  SA (KLOR-CON  M) CR tablet 40 mEq   pantoprazole  (PROTONIX ) injection 40 mg   prochlorperazine  (COMPAZINE ) injection 5 mg   diphenhydrAMINE  (BENADRYL ) injection 12.5 mg   apixaban  (ELIQUIS ) tablet 5 mg   clopidogrel  (PLAVIX ) tablet 75 mg   midodrine  (PROAMATINE ) tablet 10 mg   atorvastatin  (LIPITOR) tablet 80 mg  DISCONTD: cinacalcet  (SENSIPAR ) tablet 90 mg   fluticasone  furoate-vilanterol (BREO ELLIPTA ) 100-25 MCG/ACT 1 puff   ketotifen  (ZADITOR ) 0.035 % ophthalmic solution 1  drop   DISCONTD: sevelamer  carbonate (RENVELA ) tablet 1,600 mg   sucralfate  (CARAFATE ) 1 GM/10ML suspension 1 g   DISCONTD: acetaminophen  (TYLENOL ) tablet 650 mg   sodium chloride  flush (NS) 0.9 % injection 3 mL   OR Linked Order Group    acetaminophen  (TYLENOL ) tablet 650 mg    acetaminophen  (TYLENOL ) suppository 650 mg   albuterol  (PROVENTIL ) (2.5 MG/3ML) 0.083% nebulizer solution 2.5 mg   albumin  human 25 % solution 25 g     Orders Placed This Encounter  Procedures   Culture, blood (Routine X 2) w Reflex to ID Panel   DG Chest 2 View   Basic metabolic panel   CBC   hCG, serum, qualitative   Magnesium    Hepatic function panel   Rapid urine drug screen (hospital performed)   Occult blood card to lab, stool   HIV Antibody (routine testing w rflx)   Comprehensive metabolic panel   CBC   Diet Heart Room service appropriate? Yes; Fluid consistency: Thin   Dermabond   Document Height and Actual Weight   Maintain IV access   Vital signs   Notify physician (specify)   Refer to Sidebar Report Mobility Protocol for Adult Inpatient   Initiate Adult Central Line Maintenance and Catheter Protocol for patients with central line (CVC, PICC, Port, Hemodialysis, Trialysis)   Daily weights   Intake and Output   Initiate CHG Protocol   Do not place and if present remove PureWick   Initiate Oral Care Protocol   Initiate Carrier Fluid Protocol   RN may order General Admission PRN Orders utilizing General Admission PRN medications (through manage orders) for the following patient needs: allergy symptoms (Claritin), cold sores (Carmex), cough (Robitussin DM), eye irritation (Liquifilm Tears), hemorrhoids (Tucks), indigestion (Maalox), minor skin irritation (Hydrocortisone Cream), muscle pain Lucienne Gay), nose irritation (saline nasal spray) and sore throat (Chloraseptic spray).   Cardiac Monitoring - Continuous Indefinite   Ambulate with assistance   Full code   Consult to hospitalist   Pulse  oximetry check with vital signs   Oxygen  therapy Mode or (Route): Nasal cannula; Liters Per Minute: 2; Keep O2 saturation between: greater than 92 %   EKG 12-Lead   Type and screen [facility]   Admit to Inpatient (patient's expected length of stay will be greater than 2 midnights or inpatient only procedure)   Aspiration precautions   Fall precautions    Author: Mario LULLA Blanch, MD 12 pm- 8 pm. [facility]. 10/21/2023 4:58 PM Please note for any communication after hours contact [facility] Assigned provider on call on Amion.

## 2023-10-21 NOTE — Consult Note (Signed)
 Cardiology Consultation   Patient ID: Desiree [name] MRN: [id number]; DOB: 1975-06-30  Admit date: 10/21/2023 Date of Consult: 10/21/2023  PCP:  Trudy Vaughan HERO, [name]   [facility] Providers Cardiologist:  Shelda Bruckner, [name]     Patient Profile: Desiree [name] is a 48 y.o. female with a hx of ESRD (HD T/TH/S), hypertension, seizure, migraines, chronic respiratory failure on 3L O2, CAD s/p thrombotic anterior STEMI 09/12/22 treated with angioplasty, paroxysmal atrial fibrillation, chronic systolic heart failure with recovered EF who is being seen 10/21/2023 for the evaluation of chest pain at the request of Dr. Tobie.  History of Present Illness: Desiree [name] has past medical history as stated above. She presented to [facility] ED on 10/21/2023 after coming home after dialysis when her left arm started to bleed. She became lightheaded and then began having chest discomfort. When EMS arrived SBP in the 60s, which improved to 100 after getting 300 cc of IV fluids.   Relevant workup while in the ED includes: EKG shows sinus rhythm, HR 66 without ischemic changes compared to prior EKGs.  BP on admission was 89/53, CBC shows acute on chronic anemia, hemoglobin 9 (11 in 08/2023), BMP showed potassium 2.6, calcium  5.9, glucose 59, troponin negative x 2, CXR normal.  While in the ED they noted that the bleeding was controlled in her fistula.  They applied some Dermabond, to make sure that it did not open up again.  She was just recently seen by Reche Finder, NP in our outpatient office on 09/21/2023 for follow-up.  At this appointment she reported experiencing daily chest pain, and sometimes described as tightness.  She reported that the pain occurred at rest, during activity, sometimes lasting all day.  She was unsure if this was related to her heart or her dialysis treatment.  She reports difficulty swallowing associated with her chest tightness, she uses liquid Carafate  for temporary  relief.  She often feels like something is stuck while swallowing despite improved appetite.  For her paroxysmal atrial fibrillation, she was recently placed on a 7-day ZIO due to describing bubbles in her chest that she reports comes and goes.  She was unsure if it was gas or palpitations therefore the monitor was placed, not back yet.  Most recent echo from June 2025 showed EF of 55 to 60%, RV normal, small pericardial effusion with no tamponade, trivial MR.  She typically has her volume managed through dialysis.  Her GDMT titration has been limited by hypotension.  Her home medications include: Amiodarone  200 mg daily, Eliquis  5 mg BID, Lipitor 80 mg daily, Plavix  75 mg daily, midodrine  10 mg TID.   Cardiac catheterization from June 2024 showed: No atherosclerotic coronary artery disease, occlusions that were noted.  From a thrombus down the left main, reaching the apical LAD and the distal OM 3.  Plan at that time was to repeat an echocardiogram to look for source of embolism, resume heparin  and consider long-term anticoagulation.  Most recent echocardiogram from June 2025 showed: LVEF 55 to 60%, normal RV function, moderately dilated LA, small pericardial effusion, no tamponade, normal IVC.  This was improvement in her EF from her echo from the year prior with that was 45 to 50%.  After speaking with the patient, she agrees with the history as stated above. She tells me that she has been feeling okay until she got home from dialysis and noticed that her arm was bleeding. After that is when she felt dizzy, lightheaded and  had some chest discomfort. She has had no exertional symptoms in the recent past and she does not have any active symptoms.    Past Medical History:  Diagnosis Date   Anemia    Anxiety    panic attacks   Arthritis    bilateral knees   Childhood asthma    Complication of anesthesia    sometimes it does not work; didn't during LEEP OR (01/21/2016)   Depression    no med    ESRD (end stage renal disease) on dialysis (HCC)    TTS; [facility] [place] (08/03/2016)   GERD (gastroesophageal reflux disease)    nexium prn   Gout    History of blood transfusion    related to kidneys; I've had 4 (01/21/2016)   Hypertension    Hypertensive urgency 12/05/2021   Migraine    last one 01/18/19   Myocardial infarction [facility]) 08/2022   Preterm labor ~ 2014   Retina hole, left    Seizures (HCC)    last one was in 2000; related to preeclampsia (01/21/2016)   Past Surgical History:  Procedure Laterality Date   A/V FISTULAGRAM Left 12/19/2021   Procedure: A/V Fistulagram;  Surgeon: Desiree [name] RAMAN, [name];  Location: [facility] INVASIVE CV LAB;  Service: Cardiovascular;  Laterality: Left;   A/V FISTULAGRAM N/A 04/02/2023   Procedure: A/V Fistulagram;  Surgeon: Desiree Gordy POUR, [name];  Location: [facility] INVASIVE CV LAB;  Service: Cardiovascular;  Laterality: N/A;   AV FISTULA PLACEMENT Left 2010   AV FISTULA PLACEMENT Left 01/20/2019   Procedure: ARTERIOVENOUS (AV) FISTULA CREATION LEFT UPPER ARM;  Surgeon: Desiree [name] RAMAN, [name];  Location: [facility] OR;  Service: Vascular;  Laterality: Left;   AV FISTULA PLACEMENT Left 12/19/2021   Procedure: ARTERIOVENOUS (AV) FISTULA CREATION POSSIBLE TUNNELED DIALYSIS;  Surgeon: Desiree Penne Lonni, [name];  Location: [facility] OR;  Service: Vascular;  Laterality: Left;   CERVICAL BIOPSY  W/ LOOP ELECTRODE EXCISION  2001   CORONARY/GRAFT ACUTE MI REVASCULARIZATION N/A 09/12/2022   Procedure: Coronary/Graft Acute MI Revascularization;  Surgeon: Desiree Candyce RAMAN, [name];  Location: [facility] INVASIVE CV LAB;  Service: Cardiovascular;  Laterality: N/A;   DILATION AND EVACUATION  08/02/2011   Procedure: DILATATION AND EVACUATION;  Surgeon: Desiree [name];  Location: [facility] ORS;  Service: Gynecology;;   DILATION AND EVACUATION N/A 08/31/2013   Procedure: DILATATION AND EVACUATION;  Surgeon: Desiree KANDICE Solomons, [name];  Location: [facility] ORS;  Service: Gynecology;   Laterality: N/A;   FISTULA SUPERFICIALIZATION Left 01/20/2019   Procedure: Fistula Superficialization;  Surgeon: Desiree [name] RAMAN, [name];  Location: [facility] OR;  Service: Vascular;  Laterality: Left;   HYDRADENITIS EXCISION Right    INSERTION OF DIALYSIS CATHETER Right 12/19/2021   Procedure: INSERTION OF A TUNNELED DIALYSIS CATHETER;  Surgeon: Desiree Penne Lonni, [name];  Location: [facility] OR;  Service: Vascular;  Laterality: Right;   LEFT HEART CATH AND CORONARY ANGIOGRAPHY N/A 09/12/2022   Procedure: LEFT HEART CATH AND CORONARY ANGIOGRAPHY;  Surgeon: Desiree Candyce RAMAN, [name];  Location: [facility] INVASIVE CV LAB;  Service: Cardiovascular;  Laterality: N/A;   RENAL BIOPSY     REVISION OF ARTERIOVENOUS GORETEX GRAFT Left 02/11/2013   Procedure: REVISION OF ARTERIOVENOUS GORTEX FISTULA;  Surgeon: Krystal JULIANNA Doing, [name];  Location: [facility] OR;  Service: Vascular;  Laterality: Left;   THROMBECTOMY W/ EMBOLECTOMY Left 05/25/2018   Procedure: REPAIR OF BLEEDING ARTERIOVENOUS FISTULA;  Surgeon: Serene Gaile LELON, [name];  Location: [facility] OR;  Service: Vascular;  Laterality: Left;  Home Medications:  Prior to Admission medications   Medication Sig Start Date End Date Taking? Authorizing Provider  acetaminophen  (TYLENOL ) 325 MG tablet Take 650 mg by mouth every 6 (six) hours as needed for moderate pain (pain score 4-6), fever or headache.   Yes Provider, Historical, [name]  ALPRAZolam  (XANAX ) 1 MG tablet Take 1 tablet (1 mg total) by mouth 3 (three) times daily as needed for anxiety. Take 30 minutes prior to MRI Patient taking differently: Take 1 mg by mouth 3 (three) times daily. Take 30 minutes prior to MRI 11/17/22  Yes Persons, Ronal Dragon, PA  amiodarone  (PACERONE ) 200 MG tablet Take 1 tablet (200 mg total) by mouth daily. 12/21/22 12/25/23 Yes Vannie Reche RAMAN, NP  apixaban  (ELIQUIS ) 5 MG TABS tablet Take 1 tablet (5 mg total) by mouth 2 (two) times daily. 09/28/22  Yes Vannie Reche RAMAN, NP  atorvastatin  (LIPITOR) 80 MG tablet TAKE 1  TABLET(80 MG) BY MOUTH DAILY 09/20/23  Yes [name] Slain, [name]  cinacalcet  (SENSIPAR ) 90 MG tablet Take 90 mg by mouth every Tuesday, Thursday, and Saturday at 6 PM.   Yes Provider, Historical, [name]  clopidogrel  (PLAVIX ) 75 MG tablet Take 1 tablet (75 mg total) by mouth daily with breakfast. 09/17/22  Yes Vicci Rollo SAUNDERS, PA-C  cyclobenzaprine  (FLEXERIL ) 10 MG tablet Take 10 mg by mouth 2 (two) times daily as needed for muscle spasms.   Yes Provider, Historical, [name]  fluticasone  furoate-vilanterol (BREO ELLIPTA ) 100-25 MCG/ACT AEPB Inhale 1 puff into the lungs daily. 04/30/22  Yes Jude Harden GAILS, [name]  Ketotifen  Fumarate (ALAWAY  OP) Place 1 drop into both eyes 2 (two) times daily as needed (eye dryness or irritation).   Yes Provider, Historical, [name]  lactulose  (CHRONULAC ) 10 GM/15ML solution Take 30 mLs by mouth daily as needed (constipation). 09/04/21  Yes Provider, Historical, [name]  midodrine  (PROAMATINE ) 10 MG tablet Take 10 mg by mouth 3 (three) times daily. 09/03/22  Yes Provider, Historical, [name]  nystatin cream (MYCOSTATIN) Apply 1 Application topically 2 (two) times daily as needed (skin irritation). Apply to recurrent boils on buttocks   Yes Provider, Historical, [name]  ondansetron  (ZOFRAN -ODT) 8 MG disintegrating tablet Take 16 mg by mouth daily as needed for nausea or refractory nausea / vomiting.   Yes Provider, Historical, [name]  oxyCODONE -acetaminophen  (PERCOCET) 10-325 MG tablet Take 1 tablet by mouth every 6 (six) hours as needed (severe pain). 04/06/23  Yes Provider, Historical, [name]  sevelamer  carbonate (RENVELA ) 800 MG tablet Take 1,600 mg by mouth 3 (three) times daily with meals.   Yes Provider, Historical, [name]  sucralfate  (CARAFATE ) 1 GM/10ML suspension Take 1 g by mouth 4 (four) times daily -  with meals and at bedtime.   Yes Provider, Historical, [name]  B Complex-C-Folic Acid  (DIALYVITE 800) 0.8 MG TABS Take 1 tablet by mouth daily. Patient not taking: Reported on 10/21/2023    Provider, Historical,  [name]  NARCAN  4 MG/0.1ML LIQD nasal spray kit Place 1 spray into the nose as needed (accidental overdose). Patient not taking: No sig reported 11/14/18   Provider, Historical, [name]   Scheduled Meds:  apixaban   5 mg Oral BID   atorvastatin   80 mg Oral Daily   [START ON 10/23/2023] cinacalcet   90 mg Oral Q T,Th,Sat-1800   [START ON 10/22/2023] clopidogrel   75 mg Oral Q breakfast   fluticasone  furoate-vilanterol  1 puff Inhalation Daily   midodrine   10 mg Oral TID   pantoprazole  (PROTONIX ) IV  40 mg Intravenous Q12H  sevelamer  carbonate  1,600 mg Oral TID WC   sucralfate   1 g Oral TID WC & HS   Continuous Infusions:  potassium chloride  100 mL/hr at 10/21/23 1545   PRN Meds: acetaminophen , ketotifen   Allergies:    Allergies  Allergen Reactions   Iron  Sucrose Itching and Other (See Comments)    Hot flashes Headaches    Ms Contin  [Morphine ] Anaphylaxis and Shortness Of Breath   Prednisone Other (See Comments)    Muscle spasms Muscle cramping   Tuna [Fish Allergy] Itching, Swelling, Rash and Other (See Comments)    Facial drooping   Iodinated Contrast Media Itching   Norvasc [Amlodipine] Swelling    Angioedema    Tape Itching   Social History:   Social History   Socioeconomic History   Marital status: Media planner    Spouse name: Not on file   Number of children: Not on file   Years of education: Not on file   Highest education level: Not on file  Occupational History   Not on file  Tobacco Use   Smoking status: Former    Types: Cigarettes    Passive exposure: Yes   Smokeless tobacco: Never   Tobacco comments:    Husband and daughter Smokes on patio  Vaping Use   Vaping status: Never Used  Substance and Sexual Activity   Alcohol  use: No   Drug use: Yes    Types: Marijuana    Comment: not recent   Sexual activity: Yes    Birth control/protection: None  Other Topics Concern   Not on file  Social History Narrative   Not on file   Social Drivers of Health    Financial Resource Strain: Not on file  Food Insecurity: No Food Insecurity (08/25/2023)   Hunger Vital Sign    Worried About Running Out of Food in the Last Year: Never true    Ran Out of Food in the Last Year: Never true  Transportation Needs: No Transportation Needs (08/25/2023)   PRAPARE - Administrator, Civil Service (Medical): No    Lack of Transportation (Non-Medical): No  Physical Activity: Not on file  Stress: Not on file  Social Connections: Unknown (08/25/2023)   Social Connection and Isolation Panel    Frequency of Communication with Friends and Family: Three times a week    Frequency of Social Gatherings with Friends and Family: Three times a week    Attends Religious Services: Patient declined    Active Member of Clubs or Organizations: Patient declined    Attends Banker Meetings: Patient declined    Marital Status: Living with partner  Intimate Partner Violence: Not At Risk (08/25/2023)   Humiliation, Afraid, Rape, and Kick questionnaire    Fear of Current or Ex-Partner: No    Emotionally Abused: No    Physically Abused: No    Sexually Abused: No    Family History:   Family History  Problem Relation Age of Onset   Diabetes Mother    Hyperlipidemia Mother    Hypertension Mother    Heart disease Mother    Hypertension Other    Diabetes type II Other     ROS:  Please see the history of present illness.  All other ROS reviewed and negative.     Physical Exam/Data: Vitals:   10/21/23 1345 10/21/23 1400 10/21/23 1500 10/21/23 1545  BP: 103/88 104/64  (!) 98/54  Pulse:  60    Resp: 17 18  (!) 23  Temp:   (!) 97.5 F (36.4 C)   SpO2:  98%    Weight:      Height:        Intake/Output Summary (Last 24 hours) at 10/21/2023 1637 Last data filed at 10/21/2023 1545 Gross per 24 hour  Intake 600 ml  Output --  Net 600 ml      10/21/2023   10:57 AM 09/21/2023    2:16 PM 08/27/2023    3:45 AM  Last 3 Weights  Weight (lbs) 156 lb 1.4 oz  158 lb 8 oz 156 lb 12 oz  Weight (kg) 70.8 kg 71.895 kg 71.1 kg     Body mass index is 26.79 kg/m.   General:  in no acute distress, resting, on room air HEENT: normal Neck: no JVD Vascular: No carotid bruits; Distal pulses 2+ bilaterally Cardiac:  normal S1, S2; RRR; no murmur  Lungs:  clear to auscultation bilaterally, no wheezing, rhonchi or rales  Abd: soft, nontender, no hepatomegaly  Ext: no edema Musculoskeletal:  No deformities, BUE and BLE strength normal and equal Skin: warm and dry  Neuro:   no focal abnormalities noted Psych:  Normal affect   EKG:  The EKG was personally reviewed and demonstrates:  sinus rhythm, no acute ischemic changes present  Telemetry:  Telemetry was personally reviewed and demonstrates:  sinus rhythm, HR 60s  Relevant CV Studies:  Echocardiogram, 08/26/2023 Left ventricular ejection fraction, by estimation, is 55 to 60% . The left ventricle has normal function. The left ventricle has no regional wall motion abnormalities. Left ventricular diastolic parameters were normal. The average left ventricular global longitudinal strain is - 20. 7 % . The global longitudinal strain is normal.  Right ventricular systolic function is normal. The right ventricular size is normal. There is normal pulmonary artery systolic pressure.  Left atrial size was moderately dilated.  A small pericardial effusion is present. The pericardial effusion is posterior to the left ventricle. There is no evidence of cardiac tamponade.  The mitral valve is degenerative. Trivial mitral valve regurgitation. No evidence of mitral stenosis. Moderate mitral annular calcification.  The aortic valve is tricuspid. Aortic valve regurgitation is not visualized. Aortic valve sclerosis/ calcification is present, without any evidence of aortic stenosis.  The inferior vena cava is normal in size with greater than 50% respiratory variability, suggesting right atrial pressure of 3  mmHg.  Comparison( s) : Prior images reviewed side by side. Changes from prior study are noted. EF improved compared to prior.  Cardiac catheterization, 09/13/2022   Lat 3rd Mrg lesion is 100% stenosed.  Balloon angioplasty was performed using a BALLN EMERGE MR 2.0X12.   Post intervention, there is a 0% residual stenosis.   Dist LAD lesion is 100% stenosed.  Balloon angioplasty was performed using a BALLN EMERGE MR 2.0X12.   Post intervention, there is a 100% residual stenosis.   Mid Cx lesion is 50% stenosed.  Thrombotic.   LV end diastolic pressure is moderately elevated.   Recommend to resume Apixaban , at currently prescribed dose and frequency.   Recommend concurrent antiplatelet therapy of Clopidogrel  75mg  daily for 12 months.   No atherosclerotic coronary artery disease.  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  Angioplasty done in both areas but neither area receiving TIMI-3 flow.  Resume heparin  6 hours after femoral sheath removal.  She will need echocardiogram to look for source of embolism.  Would  consider long-term oral anticoagulation given her presentation here today.  Could also use clopidogrel  for antiplatelet therapy depending on whether or not she has bleeding issues.  Laboratory Data: High Sensitivity Troponin:   Recent Labs  Lab 10/21/23 1112 10/21/23 1414  TROPONINIHS 9 13     Chemistry Recent Labs  Lab 10/21/23 1112 10/21/23 1414  NA 143  --   K 2.6*  --   CL 112*  --   CO2 20*  --   GLUCOSE 59*  --   BUN 16  --   CREATININE 3.91*  --   CALCIUM  5.9*  --   MG  --  2.0  GFRNONAA 14*  --   ANIONGAP 11  --     Recent Labs  Lab 10/21/23 1414  PROT 7.3  ALBUMIN  3.1*  AST 19  ALT 18  ALKPHOS 172*  BILITOT 0.5   Lipids No results for input(s): CHOL, TRIG, HDL, LABVLDL, LDLCALC, CHOLHDL in the last 168 hours.  Hematology Recent Labs  Lab 10/21/23 1112  WBC  4.6  RBC 3.14*  HGB 9.0*  HCT 29.4*  MCV 93.6  MCH 28.7  MCHC 30.6  RDW 16.9*  PLT 154   Thyroid No results for input(s): TSH, FREET4 in the last 168 hours.  BNPNo results for input(s): BNP, PROBNP in the last 168 hours.  DDimer No results for input(s): DDIMER in the last 168 hours.  Radiology/Studies:  DG Chest 2 View Result Date: 10/21/2023 CLINICAL DATA:  Chest pain.  Previous myocardial infarct. EXAM: CHEST - 2 VIEW COMPARISON:  08/24/2023 FINDINGS: The heart size and mediastinal contours are within normal limits. Both lungs are clear. The visualized skeletal structures are unremarkable. IMPRESSION: No active cardiopulmonary disease. Electronically Signed   By: Norleen DELENA Kil M.D.   On: 10/21/2023 12:10   Assessment and Plan:  Chest pain CAD s/p anterior STEMI in 2024 from embolization of thrombus in apical LAD and OM 3 Presented to ED with lightheadedness after uncontrolled bleeding from fistula site postdialysis Experienced some chest discomfort during this episode, SBP noted to be in the 60s per EMS Troponin negative x 2 EKG with no ischemic changes Not currently having any active chest pain Home meds: Lipitor 80 mg daily, Plavix  75 mg daily Typically on midodrine  10 mg 3 times daily for hypotension Seen by Dr. Wendel as an inpatient June 2025, deferred ischemic evaluation at this time with mildly elevated troponins Reports these symptoms did not feel similar to her prior MI symptoms  Continue home Plavix , Lipitor  Does not appear that patient would require additional cardiac workup at this time, will discuss with [name]  HFimpEF TTE following anterior STEMI June 2024 showed LVEF 35-40% Most recent echocardiogram showed EF 55 to 60% Patient denies any active shortness of breath Denies any recent symptoms outside of today  Volume management per HD Most GDMT limited due to hypotension   Paroxysmal atrial fibrillation Prior arterial embolism of coronary  arteries Patient diagnosed with afib following 2024 STEMI, felt secondary to arterial embolism Home meds: Amiodarone  200 mg daily, Eliquis  5 mg BID Continue home Eliquis  Continue home amiodarone  as tolerated with HR  Per primary ESRD on HD Hyperlipidemia   Risk Assessment/Risk Scores:      New [place]  Heart Association (NYHA) Functional Class NYHA Class I  CHA2DS2-VASc Score = 4   This indicates a 4.8% annual risk of stroke. The patient's score is based upon: CHF History: 1 HTN History: 1 Diabetes History: 0 Stroke History:  0 Vascular Disease History: 1 Age Score: 0 Gender Score: 1        For questions or updates, please contact [facility] Please consult [email or web address] for contact info under    Signed, Waddell DELENA Donath, PA-C  10/21/2023 4:37 PM

## 2023-10-21 NOTE — ED Triage Notes (Addendum)
 Pt BIB [facility] from home for central Chest pressure.  Hx of gerd and MI.  Symptoms similar to previous MI which did not show on ECG per pt.  Pt is dialysis pt with full tx this AM.  ECG WNL for EMS. Pt is A&O  Pt also states that her fistula was bleeding on way home from dialysis.  Bleeding is controlled at this time.   324 ASA given by EMS, 20 G, R. FA, 300NS  60/30 HR 70 96% RA, RR 22, BP 100/60 after fluids

## 2023-10-21 NOTE — ED Provider Notes (Signed)
 [place] EMERGENCY DEPARTMENT AT [facility] Provider Note   CSN: [id number] Arrival date & time: 10/21/23  1049     Patient presents with: Chest Pain   Desiree [name] is a 48 y.o. female.   48 year old female with history of ESRD on IHD, thromboembolic STEMI status post angioplasty, hyperlipidemia, CHF with recovered EF, and atrial fibrillation on Eliquis  who presents to the emergency department chest discomfort.  Patient reports that she got a full session of dialysis today.  When coming home her left arm started to bleed.  Says that it went through her shirt and jacket.  Was able to get control of the bleeding.  Husband had to help her into the house and she felt very lightheaded as if she was going to pass out but did not fully lose consciousness.  Shortly thereafter started having some chest discomfort.  Describes it as a sensation of food being stuck in her chest with some bubbling on the right side of her chest as well.  Did eat prior to this happening.  When EMS arrived it blood pressure was in the 60s systolic which improved to 100 systolic after 300 mL of IV fluids.  Was also given 324 of aspirin  by EMS takes midodrine  for her hypotension.        Prior to Admission medications   Medication Sig Start Date End Date Taking? Authorizing Provider  acetaminophen  (TYLENOL ) 325 MG tablet Take 650 mg by mouth every 6 (six) hours as needed for moderate pain (pain score 4-6), fever or headache.   Yes [provider]  ALPRAZolam  (XANAX ) 1 MG tablet Take 1 tablet (1 mg total) by mouth 3 (three) times daily as needed for anxiety. Take 30 minutes prior to MRI Patient taking differently: Take 1 mg by mouth 3 (three) times daily. Take 30 minutes prior to MRI 11/17/22  Yes Persons, Ronal Dragon, PA  amiodarone  (PACERONE ) 200 MG tablet Take 1 tablet (200 mg total) by mouth daily. 12/21/22 12/25/23 Yes Vannie Reche RAMAN, NP  apixaban  (ELIQUIS ) 5 MG TABS tablet Take 1 tablet (5 mg  total) by mouth 2 (two) times daily. 09/28/22  Yes Vannie Reche RAMAN, NP  atorvastatin  (LIPITOR) 80 MG tablet TAKE 1 TABLET(80 MG) BY MOUTH DAILY 09/20/23  Yes Lonni Slain, MD  cinacalcet  (SENSIPAR ) 90 MG tablet Take 90 mg by mouth every Tuesday, Thursday, and Saturday at 6 PM.   Yes [provider]  clopidogrel  (PLAVIX ) 75 MG tablet Take 1 tablet (75 mg total) by mouth daily with breakfast. 09/17/22  Yes Louder Rollo SAUNDERS, PA-C  cyclobenzaprine  (FLEXERIL ) 10 MG tablet Take 10 mg by mouth 2 (two) times daily as needed for muscle spasms.   Yes [provider]  fluticasone  furoate-vilanterol (BREO ELLIPTA ) 100-25 MCG/ACT AEPB Inhale 1 puff into the lungs daily. 04/30/22  Yes Jude Harden GAILS, MD  Ketotifen  Fumarate (ALAWAY  OP) Place 1 drop into both eyes 2 (two) times daily as needed (eye dryness or irritation).   Yes [provider]  lactulose  (CHRONULAC ) 10 GM/15ML solution Take 30 mLs by mouth daily as needed (constipation). 09/04/21  Yes [provider]  midodrine  (PROAMATINE ) 10 MG tablet Take 10 mg by mouth 3 (three) times daily. 09/03/22  Yes [provider]  nystatin cream (MYCOSTATIN) Apply 1 Application topically 2 (two) times daily as needed (skin irritation). Apply to recurrent boils on buttocks   Yes [provider]  ondansetron  (ZOFRAN -ODT) 8 MG disintegrating tablet Take 16 mg by mouth  daily as needed for nausea or refractory nausea / vomiting.   Yes [provider]  oxyCODONE -acetaminophen  (PERCOCET) 10-325 MG tablet Take 1 tablet by mouth every 6 (six) hours as needed (severe pain). 04/06/23  Yes [provider]  sevelamer  carbonate (RENVELA ) 800 MG tablet Take 1,600 mg by mouth 3 (three) times daily with meals.   Yes [provider]  sucralfate  (CARAFATE ) 1 GM/10ML suspension Take 1 g by mouth 4 (four) times daily -  with meals and at bedtime.   Yes [provider]  B Complex-C-Folic Acid   (DIALYVITE 800) 0.8 MG TABS Take 1 tablet by mouth daily. Patient not taking: Reported on 10/21/2023    [provider]  NARCAN  4 MG/0.1ML LIQD nasal spray kit Place 1 spray into the nose as needed (accidental overdose). Patient not taking: No sig reported 11/14/18   [provider]    Allergies: Iron  sucrose, Ms contin  [morphine ], Prednisone, Tuna [fish allergy], Iodinated contrast media, Norvasc [amlodipine], and Tape    Review of Systems  Updated Vital Signs BP (!) 98/54   Pulse 60   Temp (!) 97.5 F (36.4 C)   Resp (!) 23   Ht 5' 4 (1.626 m)   Wt 70.8 kg   SpO2 98%   BMI 26.79 kg/m   Physical Exam Vitals and nursing note reviewed.  Constitutional:      General: She is not in acute distress.    Appearance: She is well-developed.  HENT:     Head: Normocephalic and atraumatic.     Right Ear: External ear normal.     Left Ear: External ear normal.     Nose: Nose normal.  Eyes:     Extraocular Movements: Extraocular movements intact.     Conjunctiva/sclera: Conjunctivae normal.     Pupils: Pupils are equal, round, and reactive to light.  Cardiovascular:     Rate and Rhythm: Normal rate and regular rhythm.     Heart sounds: No murmur heard.    Comments: Fistula in left upper extremity with bruit and thrill.  No active bleeding noted. Pulmonary:     Effort: Pulmonary effort is normal. No respiratory distress.     Breath sounds: Normal breath sounds.  Musculoskeletal:     Cervical back: Normal range of motion and neck supple.     Right lower leg: No edema.     Left lower leg: No edema.  Skin:    General: Skin is warm and dry.  Neurological:     Mental Status: She is alert and oriented to person, place, and time. Mental status is at baseline.  Psychiatric:        Mood and Affect: Mood normal.     (all labs ordered are listed, but only abnormal results are displayed) Labs Reviewed  BASIC METABOLIC PANEL WITH GFR - Abnormal; Notable for the following  components:      Result Value   Potassium 2.6 (*)    Chloride 112 (*)    CO2 20 (*)    Glucose, Bld 59 (*)    Creatinine, Ser 3.91 (*)    Calcium  5.9 (*)    GFR, Estimated 14 (*)    All other components within normal limits  CBC - Abnormal; Notable for the following components:   RBC 3.14 (*)    Hemoglobin 9.0 (*)    HCT 29.4 (*)    RDW 16.9 (*)    All other components within normal limits  HEPATIC FUNCTION PANEL - Abnormal;  Notable for the following components:   Albumin  3.1 (*)    Alkaline Phosphatase 172 (*)    All other components within normal limits  CULTURE, BLOOD (ROUTINE X 2)  CULTURE, BLOOD (ROUTINE X 2)  HCG, SERUM, QUALITATIVE  MAGNESIUM   RAPID URINE DRUG SCREEN, HOSP PERFORMED  TYPE AND SCREEN  TROPONIN I (HIGH SENSITIVITY)  TROPONIN I (HIGH SENSITIVITY)    EKG: EKG Interpretation Date/Time:  Thursday October 21 2023 10:56:12 EDT Ventricular Rate:  66 PR Interval:  123 QRS Duration:  95 QT Interval:  482 QTC Calculation: 506 R Axis:   11  Text Interpretation: Sinus rhythm Borderline prolonged QT interval Confirmed by Yolande Charleston [id number]) on 10/21/2023 11:11:45 AM  Radiology: [facility] Chest 2 View Result Date: 10/21/2023 CLINICAL DATA:  Chest pain.  Previous myocardial infarct. EXAM: CHEST - 2 VIEW COMPARISON:  08/24/2023 FINDINGS: The heart size and mediastinal contours are within normal limits. Both lungs are clear. The visualized skeletal structures are unremarkable. IMPRESSION: No active cardiopulmonary disease. Electronically Signed   By: Norleen DELENA Kil M.D.   On: 10/21/2023 12:10     Procedures   Medications Ordered in the ED  potassium chloride  10 mEq in 100 mL IVPB ( Intravenous Restarted 10/21/23 1545)  pantoprazole  (PROTONIX ) injection 40 mg (40 mg Intravenous Given 10/21/23 1511)  apixaban  (ELIQUIS ) tablet 5 mg (has no administration in time range)  clopidogrel  (PLAVIX ) tablet 75 mg (has no administration in time range)  midodrine  (PROAMATINE ) tablet  10 mg (has no administration in time range)  atorvastatin  (LIPITOR) tablet 80 mg (has no administration in time range)  cinacalcet  (SENSIPAR ) tablet 90 mg (has no administration in time range)  fluticasone  furoate-vilanterol (BREO ELLIPTA ) 100-25 MCG/ACT 1 puff (has no administration in time range)  ketotifen  (ZADITOR ) 0.035 % ophthalmic solution 1 drop (has no administration in time range)  sevelamer  carbonate (RENVELA ) tablet 1,600 mg (has no administration in time range)  sucralfate  (CARAFATE ) 1 GM/10ML suspension 1 g (has no administration in time range)  acetaminophen  (TYLENOL ) tablet 650 mg (has no administration in time range)  midodrine  (PROAMATINE ) tablet 10 mg (10 mg Oral Given 10/21/23 1124)  sodium chloride  0.9 % bolus 500 mL (0 mLs Intravenous Stopped 10/21/23 1508)  fentaNYL  (SUBLIMAZE ) injection 50 mcg (50 mcg Intravenous Given 10/21/23 1125)  alum & mag hydroxide-simeth (MAALOX/MYLANTA) 200-200-20 MG/5ML suspension 30 mL (30 mLs Oral Given 10/21/23 1124)    And  lidocaine  (XYLOCAINE ) 2 % viscous mouth solution 15 mL (15 mLs Oral Given 10/21/23 1124)  famotidine  (PEPCID ) IVPB 20 mg premix (0 mg Intravenous Stopped 10/21/23 1507)  fentaNYL  (SUBLIMAZE ) injection 50 mcg (50 mcg Intravenous Given 10/21/23 1411)  calcium  gluconate 1 g/ 50 mL sodium chloride  IVPB (0 mg Intravenous Stopped 10/21/23 1508)  potassium chloride  SA (KLOR-CON  M) CR tablet 40 mEq (40 mEq Oral Given 10/21/23 1412)  prochlorperazine  (COMPAZINE ) injection 5 mg (5 mg Intravenous Given 10/21/23 1511)  diphenhydrAMINE  (BENADRYL ) injection 12.5 mg (12.5 mg Intravenous Given 10/21/23 1511)    Clinical Course as of 10/21/23 1554  Thu Oct 21, 2023  1355 Dr Mario Blanch from hospitalist will evaluate the patient for admission [RP]    Clinical Course User Index [RP] Yolande Charleston BROCKS, MD                                 Medical Decision Making Amount and/or Complexity of Data Reviewed Labs: ordered. Radiology: ordered.  Risk OTC  drugs. Prescription drug management. Decision regarding hospitalization.   48 year old female with history of ESRD on IHD, thromboembolic STEMI status post angioplasty, hyperlipidemia, CHF with recovered EF, and atrial fibrillation on Eliquis  who presents to the emergency department chest discomfort.   Initial Ddx:  Hemorrhagic shock, symptomatic anemia, MI, PE, dissection, CHF, gastritis  MDM/Course:  Patient presents emergency department with chest discomfort.  This is in the setting of recently having dialysis.  Did report some bleeding afterwards.  Did have presyncopal episode as well.  On arrival blood pressures are soft but she reports that this is normal for her.  She does not appear to be in acute distress.  She is no longer lightheaded or symptomatic aside from having some chest discomfort.  She is not tachycardic right now.  Bleeding is controlled on her fistula.  I did place some Dermabond to ensure that the areas that were bleeding did not open up again.  Initially was concerned for MI so did obtain EKG which shows sinus rhythm with borderline prolonged QT but no other acute ischemic changes.  Her high-sensitivity troponin was normal at 9.  She is awaiting repeat at this time.  Did consider PE but with her Eliquis  use feel this much less likely.  Given her description of pain feel dissection is much less likely.  She had a hemoglobin that was at her baseline and do not feel that she is having hemorrhagic shock at all right now.  She was given IV fluids and midodrine  and upon re-evaluation blood pressure improved.  No infectious symptoms at this time.  Lab work does show that her potassium and calcium  are both critically low.  Admitted to hospitalist for observation and electrolyte repletion meantime.  This patient presents to the ED for concern of complaints listed in HPI, this involves an extensive number of treatment options, and is a complaint that carries with it a high risk of  complications and morbidity. Disposition including potential need for admission considered.   Dispo: Admit to Floor  Records reviewed Outpatient Clinic Notes The following labs were independently interpreted: Chemistry and show CKD and hyperkalemia, anemia I independently reviewed the following imaging with scope of interpretation limited to determining acute life threatening conditions related to emergency care: Chest x-ray and agree with the radiologist interpretation with the following exceptions: none I personally reviewed and interpreted cardiac monitoring: normal sinus rhythm  I personally reviewed and interpreted the pt's EKG: see above for interpretation  I have reviewed the patients home medications and made adjustments as needed Consults: Hospitalist  CRITICAL CARE Performed by: Lamar JAYSON Shan   Total critical care time: 30 minutes  Critical care time was exclusive of separately billable procedures and treating other patients.  Critical care was necessary to treat or prevent imminent or life-threatening deterioration.  Critical care was time spent personally by me on the following activities: development of treatment plan with patient and/or surrogate as well as nursing, discussions with consultants, evaluation of patient's response to treatment, examination of patient, obtaining history from patient or surrogate, ordering and performing treatments and interventions, ordering and review of laboratory studies, ordering and review of radiographic studies, pulse oximetry and re-evaluation of patient's condition.   Portions of this note were generated with Scientist, clinical (histocompatibility and immunogenetics). Dictation errors may occur despite best attempts at proofreading.     Final diagnoses:  Atypical chest pain  Hypotension, unspecified hypotension type  Hypokalemia  Hypocalcemia    ED Discharge Orders     None  Yolande Lamar BROCKS, MD 10/21/23 [phone]

## 2023-10-21 NOTE — ED Notes (Signed)
 Patient transported to X-ray

## 2023-10-21 NOTE — ED Notes (Signed)
 Called floor to advise pt will be in route, floor receptive.

## 2023-10-22 ENCOUNTER — Ambulatory Visit (HOSPITAL_BASED_OUTPATIENT_CLINIC_OR_DEPARTMENT_OTHER): Admitting: Cardiology

## 2023-10-22 DIAGNOSIS — I2510 Atherosclerotic heart disease of native coronary artery without angina pectoris: Secondary | ICD-10-CM | POA: Diagnosis not present

## 2023-10-22 DIAGNOSIS — R7989 Other specified abnormal findings of blood chemistry: Secondary | ICD-10-CM

## 2023-10-22 DIAGNOSIS — Z992 Dependence on renal dialysis: Secondary | ICD-10-CM

## 2023-10-22 DIAGNOSIS — R1310 Dysphagia, unspecified: Secondary | ICD-10-CM | POA: Diagnosis not present

## 2023-10-22 DIAGNOSIS — D649 Anemia, unspecified: Secondary | ICD-10-CM

## 2023-10-22 DIAGNOSIS — N186 End stage renal disease: Secondary | ICD-10-CM | POA: Diagnosis not present

## 2023-10-22 DIAGNOSIS — R0789 Other chest pain: Secondary | ICD-10-CM | POA: Diagnosis not present

## 2023-10-22 DIAGNOSIS — Z7901 Long term (current) use of anticoagulants: Secondary | ICD-10-CM

## 2023-10-22 LAB — COMPREHENSIVE METABOLIC PANEL WITH GFR
ALT: 17 U/L (ref 0–44)
AST: 22 U/L (ref 15–41)
Albumin: 3.4 g/dL — ABNORMAL LOW (ref 3.5–5.0)
Alkaline Phosphatase: 149 U/L — ABNORMAL HIGH (ref 38–126)
Anion gap: 13 (ref 5–15)
BUN: 39 mg/dL — ABNORMAL HIGH (ref 6–20)
CO2: 28 mmol/L (ref 22–32)
Calcium: 8.5 mg/dL — ABNORMAL LOW (ref 8.9–10.3)
Chloride: 98 mmol/L (ref 98–111)
Creatinine, Ser: 8.39 mg/dL — ABNORMAL HIGH (ref 0.44–1.00)
GFR, Estimated: 5 mL/min — ABNORMAL LOW (ref >60)
Glucose, Bld: 91 mg/dL (ref 70–99)
Potassium: 4.5 mmol/L (ref 3.5–5.1)
Sodium: 139 mmol/L (ref 135–145)
Total Bilirubin: 0.6 mg/dL (ref 0.0–1.2)
Total Protein: 7.3 g/dL (ref 6.5–8.1)

## 2023-10-22 LAB — CBC
HCT: 36.8 % (ref 36.0–46.0)
Hemoglobin: 11.6 g/dL — ABNORMAL LOW (ref 12.0–15.0)
MCH: 28.2 pg (ref 26.0–34.0)
MCHC: 31.5 g/dL (ref 30.0–36.0)
MCV: 89.3 fL (ref 80.0–100.0)
Platelets: 221 K/uL (ref 150–400)
RBC: 4.12 MIL/uL (ref 3.87–5.11)
RDW: 16.7 % — ABNORMAL HIGH (ref 11.5–15.5)
WBC: 6.2 K/uL (ref 4.0–10.5)
nRBC: 0 % (ref 0.0–0.2)

## 2023-10-22 MED ORDER — PANTOPRAZOLE SODIUM 40 MG PO TBEC: 40.0000 mg | DELAYED_RELEASE_TABLET | Freq: Every day | ORAL | 1 refills | Status: AC

## 2023-10-22 NOTE — Progress Notes (Signed)
 Reviewed AVS, patient expressed understanding of medications, MD follow up reviewed.   Removed IV, Site clean, dry and intact.  See LDA for information on Fistula at discharge.  Patient states all belongings brought to the hospital at time of admission are accounted for and packed to take home.   Patient informed and expressed understanding where to pick up discharge medications.   Patient walked off unit with out assistance stated she could not wait any longer her ride was going to leave her. RN instructed patient on where entrance A was located.

## 2023-10-22 NOTE — TOC Progression Note (Signed)
 Transition of Care [facility]) - Progression Note    Patient Details  Name: Desiree [name] MRN: [id number] Date of Birth: 02-Feb-1976  Transition of Care [facility]) CM/SW Contact  [name], [place] Phone Number: 10/22/2023, 12:15 PM  Clinical Narrative:   CSW provided patient with transportation resources for other appointments.   No further needs at this time. Please reach out if a new need arises.   Expected Discharge Plan: Home/Self Care Barriers to Discharge: No Barriers Identified               Expected Discharge Plan and Services In-house Referral: Clinical Social Work Discharge Planning Services: CM Consult Post Acute Care Choice: NA Living arrangements for the past 2 months: Single Family Home Expected Discharge Date: 10/22/23                 DME Agency: NA       HH Arranged: NA           Social Drivers of Health (SDOH) Interventions SDOH Screenings   Food Insecurity: Food Insecurity Present (10/21/2023)  Housing: Low Risk  (10/21/2023)  Recent Concern: Housing - High Risk (08/25/2023)  Transportation Needs: No Transportation Needs (10/21/2023)  Utilities: Not At Risk (10/21/2023)  Social Connections: Unknown (08/25/2023)  Tobacco Use: Medium Risk (10/21/2023)    Readmission Risk Interventions    10/22/2023   11:23 AM 05/10/2023    1:09 PM 09/14/2022    3:42 PM  Readmission Risk Prevention Plan  Transportation Screening Complete Complete Complete  Medication Review (RN Care Manager) Referral to Pharmacy Referral to Pharmacy Complete  PCP or Specialist appointment within 3-5 days of discharge  Complete   HRI or Home Care Consult Complete  Complete  SW Recovery Care/Counseling Consult Complete Complete Complete  Palliative Care Screening  Not Applicable Not Applicable  Skilled Nursing Facility Not Applicable Not Applicable Not Applicable

## 2023-10-22 NOTE — Progress Notes (Signed)
 AVS completed; unable to print Code 44.

## 2023-10-22 NOTE — TOC Initial Note (Signed)
 Transition of Care [facility]) - Initial/Assessment Note    Patient Details  Name: Desiree [name] MRN: [id number] Date of Birth: 1975-10-11  Transition of Care [facility]) CM/SW Contact:    Sudie Erminio Deems, RN Phone Number: 10/22/2023, 11:27 AM  Clinical Narrative:  Risk for readmission assessment completed. Patient presented for chest pain. Hx ESRD-TTS. Patient states she has transportation to HD via [employer]; however, is having issues securing additional [employer] via [facility]. CSW to assist with additional transportation resources. PTA patient was from home with spouse. Patient does use a cane for stability. Patient reports that she has a PCP. No home needs identified at this time.                 Expected Discharge Plan: Home/Self Care Barriers to Discharge: No Barriers Identified   Patient Goals and CMS Choice Patient states their goals for this hospitalization and ongoing recovery are:: plan to return home   Choice offered to / list presented to : NA      Expected Discharge Plan and Services In-house Referral: Clinical Social Work Discharge Planning Services: CM Consult Post Acute Care Choice: NA Living arrangements for the past 2 months: Single Family Home Expected Discharge Date: 10/22/23                 DME Agency: NA       HH Arranged: NA          Prior Living Arrangements/Services Living arrangements for the past 2 months: Single Family Home Lives with:: Spouse Patient language and need for interpreter reviewed:: Yes Do you feel safe going back to the place where you live?: Yes      Need for Family Participation in Patient Care: No (Comment) Care giver support system in place?: No (comment) Current home services: DME (cane) Criminal Activity/Legal Involvement Pertinent to Current Situation/Hospitalization: No - Comment as needed  Activities of Daily Living   ADL Screening (condition at time of  admission) Independently performs ADLs?: Yes (appropriate for developmental age) (walker baseline) Is the patient deaf or have difficulty hearing?: Yes Does the patient have difficulty seeing, even when wearing glasses/contacts?: No Does the patient have difficulty concentrating, remembering, or making decisions?: No  Permission Sought/Granted Permission sought to share information with : Family Supports, Case Manager                Emotional Assessment Appearance:: Appears stated age Attitude/Demeanor/Rapport: Engaged Affect (typically observed): Appropriate Orientation: : Oriented to Self, Oriented to Place, Oriented to  Time, Oriented to Situation Alcohol  / Substance Use: Not Applicable Psych Involvement: No (comment)  Admission diagnosis:  Hypocalcemia [E83.51] Hypokalemia [E87.6] Atypical chest pain [R07.89] Hypotension, unspecified hypotension type [I95.9] Patient Active Problem List   Diagnosis Date Noted   Atypical chest pain 10/21/2023   Chest pain 08/24/2023   Influenzal bronchitis 05/09/2023   Influenza A 05/08/2023   Chronic heart failure with mildly reduced ejection fraction (HFmrEF, 41-49%) (HCC) 05/08/2023   Low back pain 11/17/2022   A-fib (HCC) 11/04/2022   Rapid atrial fibrillation (HCC) 11/03/2022   Hypotension of hemodialysis 11/03/2022   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   Acute on chronic systolic CHF (congestive heart failure) (HCC) 09/17/2022   Pulmonary hypertension, unspecified (HCC) 09/17/2022   Pericardial effusion 09/17/2022   Nonrheumatic mitral valve regurgitation 09/17/2022   Tricuspid regurgitation 09/17/2022   Cardiomyopathy, ischemic 09/14/2022   Anteroapical myocardial infarction (HCC) 09/13/2022   Paroxysmal atrial fibrillation (HCC) 09/13/2022   Asthma exacerbation 04/30/2022  Chronic respiratory failure with hypoxia (HCC) - on hme O2 @ 3 L/min 02/03/2022   Anxiety and depression 02/03/2022   Gout 02/03/2022   Hypertension 02/03/2022    Volume overload 12/21/2021   Class 1 obesity 12/20/2021   Heart failure with mildly reduced ejection fraction (HFmrEF) (HCC) 12/19/2021   Prolonged QT interval 12/05/2021   GERD (gastroesophageal reflux disease) 01/11/2021   ESRD (end stage renal disease) on dialysis (HCC) 06/02/2018   Chronic pain 01/21/2016   Muscle cramps 03/29/2015   Anemia of chronic kidney failure 09/15/2013   Cocaine  abuse (HCC) 08/26/2013   Depression 04/21/2012   ESRD (end stage renal disease) (HCC) 08/03/2011   PCP:  Trudy Vaughan HERO, MD Pharmacy:   [id number] [place], [place] - [address] AT [facility] OF [facility] & [address] [place] [address] Phone: [phone] Fax: [phone]  [facility] [address] [place] [address] Phone: [phone] Fax: [phone]     Social Drivers of Health (SDOH) Social History: SDOH Screenings   Food Insecurity: Food Insecurity Present (10/21/2023)  Housing: Low Risk  (10/21/2023)  Recent Concern: Housing - High Risk (08/25/2023)  Transportation Needs: No Transportation Needs (10/21/2023)  Utilities: Not At Risk (10/21/2023)  Social Connections: Unknown (08/25/2023)  Tobacco Use: Medium Risk (10/21/2023)   SDOH Interventions:     Readmission Risk Interventions    10/22/2023   11:23 AM 05/10/2023    1:09 PM 09/14/2022    3:42 PM  Readmission Risk Prevention Plan  Transportation Screening Complete Complete Complete  Medication Review (RN Care Manager) Referral to Pharmacy Referral to Pharmacy Complete  PCP or Specialist appointment within 3-5 days of discharge  Complete   HRI or Home Care Consult Complete  Complete  SW Recovery Care/Counseling Consult Complete Complete Complete  Palliative Care Screening  Not Applicable Not Applicable  Skilled Nursing Facility Not Applicable Not Applicable Not Applicable

## 2023-10-22 NOTE — Plan of Care (Signed)

## 2023-10-22 NOTE — Discharge Summary (Signed)
 Physician Discharge Summary   Patient: Desiree [name] MRN: [id number] DOB: 09-08-1975  Admit date:     10/21/2023  Discharge date: 10/22/23  Discharge Physician: Lonni SHAUNNA Dalton   PCP: Trudy Vaughan HERO, MD     Recommendations at discharge:  Follow up with Cardiology Dr. Lonni as planned Follow up with [facility] for dysphagia and GERD     Discharge Diagnoses: Principal Problem:   Chest pain Other hospital problems   ESRD   Hypertension   Chronic respiratory failure   Seizure disorder     Hospital Course: 48 y.o. F with ESRD on HD TThS, HTN, chronic respiratory failure on home O2, seizure disorder and history thromboembolic STEMI 2024 who presented again with acute chest pain.   Patient went to routine dialysis on day of admission, went home, and her fistula started to bleed.  She became lightheaded then had chest discomfort that felt like her previous angina.  EMS found the patient with blood pressure in the 60s which improved with IV fluids.  Troponins were normal.  ECG unchanged from previous.  Chest x-ray normal.  Cardiology were consulted, recommended no further workup.  Presumably this was hypotension related angina, without new ACS.  She was observed overnight, her symptoms are completely resolved, and she was discharged home.  There was some impression that perhaps her hemoglobin had dropped, but on review of her chart history, her hemoglobin appears to be in the same range that she normally is in, 9 to 11 g/dL.  GI were consulted, they evaluated the patient, recommended outpatient follow-up, stopping sucralfate  due to her renal failure, and taking pantoprazole .  She was not previously on this medication, and so it was started.           The   Controlled Substances Registry was reviewed for this patient prior to discharge.  Consultants: Cardiology, GI   Disposition: Home Diet recommendation:  Discharge Diet Orders  (From admission, onward)     Start     Ordered   10/22/23 0000  Diet - low sodium heart healthy        10/22/23 1026             DISCHARGE MEDICATION: Allergies as of 10/22/2023       Reactions   Iron  Sucrose Itching, Other (See Comments)   Hot flashes Headaches    Ms Contin  [morphine ] Anaphylaxis, Shortness Of Breath   Prednisone Other (See Comments)   Muscle spasms Muscle cramping   Tuna [fish Allergy] Itching, Swelling, Rash, Other (See Comments)   Facial drooping   Iodinated Contrast Media Itching   Norvasc [amlodipine] Swelling   Angioedema    Tape Itching        Medication List     STOP taking these medications    sucralfate  1 GM/10ML suspension Commonly known as: CARAFATE        TAKE these medications    acetaminophen  325 MG tablet Commonly known as: TYLENOL  Take 650 mg by mouth every 6 (six) hours as needed for moderate pain (pain score 4-6), fever or headache.   ALAWAY  OP Place 1 drop into both eyes 2 (two) times daily as needed (eye dryness or irritation).   ALPRAZolam  1 MG tablet Commonly known as: XANAX  Take 1 tablet (1 mg total) by mouth 3 (three) times daily as needed for anxiety. Take 30 minutes prior to MRI What changed: when to take this   amiodarone  200 MG tablet Commonly known as: PACERONE  Take 1 tablet (200 mg  total) by mouth daily.   apixaban  5 MG Tabs tablet Commonly known as: ELIQUIS  Take 1 tablet (5 mg total) by mouth 2 (two) times daily.   atorvastatin  80 MG tablet Commonly known as: LIPITOR TAKE 1 TABLET(80 MG) BY MOUTH DAILY   cinacalcet  90 MG tablet Commonly known as: SENSIPAR  Take 90 mg by mouth every Tuesday, Thursday, and Saturday at 6 PM.   clopidogrel  75 MG tablet Commonly known as: PLAVIX  Take 1 tablet (75 mg total) by mouth daily with breakfast.   cyclobenzaprine  10 MG tablet Commonly known as: FLEXERIL  Take 10 mg by mouth 2 (two) times daily as needed for muscle spasms.   Dialyvite 800 0.8 MG  Tabs Take 1 tablet by mouth daily.   fluticasone  furoate-vilanterol 100-25 MCG/ACT Aepb Commonly known as: Breo Ellipta  Inhale 1 puff into the lungs daily.   lactulose  10 GM/15ML solution Commonly known as: CHRONULAC  Take 30 mLs by mouth daily as needed (constipation).   midodrine  10 MG tablet Commonly known as: PROAMATINE  Take 10 mg by mouth 3 (three) times daily.   Narcan  4 MG/0.1ML Liqd nasal spray kit Generic drug: naloxone  Place 1 spray into the nose as needed (accidental overdose).   nystatin cream Commonly known as: MYCOSTATIN Apply 1 Application topically 2 (two) times daily as needed (skin irritation). Apply to recurrent boils on buttocks   ondansetron  8 MG disintegrating tablet Commonly known as: ZOFRAN -ODT Take 16 mg by mouth daily as needed for nausea or refractory nausea / vomiting.   oxyCODONE -acetaminophen  10-325 MG tablet Commonly known as: PERCOCET Take 1 tablet by mouth every 6 (six) hours as needed (severe pain).   pantoprazole  40 MG tablet Commonly known as: Protonix  Take 1 tablet (40 mg total) by mouth daily.   sevelamer  carbonate 800 MG tablet Commonly known as: RENVELA  Take 1,600 mg by mouth 3 (three) times daily with meals.        Follow-up Information     Lonni Slain, MD Follow up.   Specialty: Cardiology Contact information: [address] [place] [address] [phone]                 Discharge Instructions     Diet - low sodium heart healthy   Complete by: As directed    Discharge instructions   Complete by: As directed    **IMPORTANT DISCHARGE INSTRUCTIONS**   From Dr. Jonel: You were admitted for chest pain Here, we found that this was probably from low blood pressure (low blood pressure leading to low oxygen  delivery to the heart which causes pain)  You were evaluated by the Cardiology team (Dr. Eston partners) and they reviewed your lab tests and EKG and were able to tell with  confidence that you had NOT had a heart attack  There was some concern that your blood level had dropped, but this is incorrect, your blood levels appear to be stable in the range that they've been since around 2010 (your hemoglobin is usually between 9 and 11 g/dL)  You were seen by the GI team in the hospital, and they recommend a few things: - stop Carafate /sucralfate  - start pantoprazole  40 mg  - go see them in the office   Increase activity slowly   Complete by: As directed        Discharge Exam: Filed Weights   10/21/23 1057 10/22/23 0439  Weight: 70.8 kg 72.5 kg    General: Pt is alert, awake, not in acute distress Cardiovascular: RRR, nl S1-S2, no murmurs appreciated.  No LE edema.   Respiratory: Normal respiratory rate and rhythm.  CTAB without rales or wheezes. Abdominal: Abdomen soft and non-tender.  No distension or HSM.   Neuro/Psych: Strength symmetric in upper and lower extremities.  Judgment and insight appear normal.   Condition at discharge: good  The results of significant diagnostics from this hospitalization (including imaging, microbiology, ancillary and laboratory) are listed below for reference.   Imaging Studies: DG Chest 2 View Result Date: 10/21/2023 CLINICAL DATA:  Chest pain.  Previous myocardial infarct. EXAM: CHEST - 2 VIEW COMPARISON:  08/24/2023 FINDINGS: The heart size and mediastinal contours are within normal limits. Both lungs are clear. The visualized skeletal structures are unremarkable. IMPRESSION: No active cardiopulmonary disease. Electronically Signed   By: Norleen DELENA Kil M.D.   On: 10/21/2023 12:10    Microbiology: Results for orders placed or performed during the hospital encounter of 10/21/23  Culture, blood (Routine X 2) w Reflex to ID Panel     Status: None (Preliminary result)   Collection Time: 10/21/23  9:31 PM   Specimen: BLOOD RIGHT HAND  Result Value Ref Range Status   Specimen Description BLOOD RIGHT HAND  Final   Special  Requests   Final    BOTTLES DRAWN AEROBIC AND ANAEROBIC Blood Culture adequate volume   Culture   Final    NO GROWTH < 12 HOURS Performed at [facility], [address], [place], [place] [address]    Report Status PENDING  Incomplete  Culture, blood (Routine X 2) w Reflex to ID Panel     Status: None (Preliminary result)   Collection Time: 10/21/23 10:00 PM   Specimen: BLOOD RIGHT ARM  Result Value Ref Range Status   Specimen Description BLOOD RIGHT ARM  Final   Special Requests   Final    BOTTLES DRAWN AEROBIC AND ANAEROBIC Blood Culture adequate volume   Culture   Final    NO GROWTH < 12 HOURS Performed at [facility], [address] [place], [place] [address]    Report Status PENDING  Incomplete    Labs: CBC: Recent Labs  Lab 10/21/23 1112 10/22/23 0311  WBC 4.6 6.2  HGB 9.0* 11.6*  HCT 29.4* 36.8  MCV 93.6 89.3  PLT 154 221   Basic Metabolic Panel: Recent Labs  Lab 10/21/23 1112 10/21/23 1414 10/22/23 0311  NA 143  --  139  K 2.6*  --  4.5  CL 112*  --  98  CO2 20*  --  28  GLUCOSE 59*  --  91  BUN 16  --  39*  CREATININE 3.91*  --  8.39*  CALCIUM  5.9*  --  8.5*  MG  --  2.0  --    Liver Function Tests: Recent Labs  Lab 10/21/23 1414 10/22/23 0311  AST 19 22  ALT 18 17  ALKPHOS 172* 149*  BILITOT 0.5 0.6  PROT 7.3 7.3  ALBUMIN  3.1* 3.4*   CBG: No results for input(s): GLUCAP in the last 168 hours.  Discharge time spent: approximately 25 minutes spent on discharge counseling, evaluation of patient on day of discharge, and coordination of discharge planning with nursing, social work, pharmacy and case management  Signed: Lonni SHAUNNA Dalton, MD [facility] 10/22/2023

## 2023-10-22 NOTE — Care Management Obs Status (Signed)
 MEDICARE OBSERVATION STATUS NOTIFICATION   Patient Details  Name: Desiree [name] MRN: [id number] Date of Birth: 03-19-1975   Medicare Observation Status Notification Given:  Yes    Sudie Erminio Deems, RN 10/22/2023, 11:57 AM

## 2023-10-22 NOTE — Consult Note (Signed)
 Consultation Note   Referring Provider:  [facility] PCP: Trudy Vaughan HERO, MD Primary Gastroenterologist:   [facility]     Reason for Consultation:  Chest pain  DOA: 10/21/2023         Hospital Day: 2   ASSESSMENT    Patient profile : 48 yo female with a medical history including but not limited to ESRD on HD, HTN, seizures, chronic respiratory failure, CAD.  See PMH for additional history.  Patient admitted yesterday with chest pain and hypotension  Atypical chest pain  (reason for admission ) Chronic intermittent chest pain.   CAD / STEMI June 2024, s/p angioplasty 09/13/22 Prolonged QT ? GERD related chest pain .  On plavix  and Eliquis   Troponins negative. Cardiology has evaluated and ACS rule out.    Mildly elevated alkaline phosphatase, improving Was also elevated in June 2025 but other than that alk phos historically normal so does not seem secondary to renal disease . No history of cholelithiasis or CBD duct dilation on ultrasound in 2022.  l  Acute on chronic chest pain /intermittent solid food dysphagia ? GERD Chest pain has resolved On pantoprazole  once daily at home.  Started on Carafate  a couple of months ago with improvement in chest pain except for the recurrence yesterday which is now resolved  Chronic respiratory failure on O2 3 L at home  Acute on chronic [place] anemia Chronic anemia in setting of CKD.  Reported bleeding from fistula yesterday with decline in hemoglobin from baseline of around 11 to 9 . Hgb spontaneously up to 11.6 overnight so the 9 may have been a spurious result.  She denies any overt GI bleeding  ESRD on HD  Chronic respiratory failule on home 02  Principal Problem:   Atypical chest pain      PLAN:   --Patient tells me that Cardiology has just seen her and cleared her for discharge.   --Would recommend increasing pantoprazole  to 40 mg twice daily, 30 minutes prior to breakfast  and 30 minutes prior to dinner. Would recommend discontinuation of stool Carafate  as she has been on it for 2 months and this is not ideal since she has ESRD.    --Discussed antireflux measures --Our office will call patient with a hospital follow-up.  When we see her in the office we can discuss possible EGD as well as a screening colonoscopy but ideally she would need to be able to hold Plavix  and Eliquis  for procedures.  -- At the time of outpatient visit we can repeat LFTs.  Consider repeat RUQ ultrasound as well  HPI   Mykayla was admitted yesterday with chest pain and dizziness following dialysis, she describes pain in distal chest which is not a new discomfort.  She has been on pantoprazole  at home for a very long time.  She was started on Carafate  a couple of months ago and feels like that has been very helpful until the episode of chest pain yesterday.  The chest pain is not necessarily associated with eating.  She feels like there are air bubbles in her chest sometimes and this causes discomfort.  She also endorses occasional solid food dysphagia.  No lower GI complaints such as bowel changes, blood in stool.  No melena.  No family history of colon cancer  Data reviewed Hemoglobin 11.6, MCV 89  .platelets 221.  Alk phos 172 >> 149.  Normal bilirubin and normal transaminases.  Chest x-ray without acute findings  Labs and Imaging:  Recent Labs    10/21/23 1414 10/22/23 0311  PROT 7.3 7.3  ALBUMIN  3.1* 3.4*  AST 19 22  ALT 18 17  ALKPHOS 172* 149*  BILITOT 0.5 0.6  BILIDIR <0.1  --   IBILI NOT CALCULATED  --    Recent Labs    10/21/23 1112 10/22/23 0311  WBC 4.6 6.2  HGB 9.0* 11.6*  HCT 29.4* 36.8  MCV 93.6 89.3  PLT 154 221   Recent Labs    10/21/23 1112 10/22/23 0311  NA 143 139  K 2.6* 4.5  CL 112* 98  CO2 20* 28  GLUCOSE 59* 91  BUN 16 39*  CREATININE 3.91* 8.39*  CALCIUM  5.9* 8.5*     DG Chest 2 View CLINICAL DATA:  Chest pain.  Previous myocardial  infarct.  EXAM: CHEST - 2 VIEW  COMPARISON:  08/24/2023  FINDINGS: The heart size and mediastinal contours are within normal limits. Both lungs are clear. The visualized skeletal structures are unremarkable.  IMPRESSION: No active cardiopulmonary disease.  Electronically Signed   By: Norleen DELENA Kil M.D.   On: 10/21/2023 12:10    Pertinent GI Studies         Past Medical History:  Diagnosis Date   Anemia    Anxiety    panic attacks   Arthritis    bilateral knees   Childhood asthma    Complication of anesthesia    sometimes it does not work; didn't during LEEP OR (01/21/2016)   Depression    no med   ESRD (end stage renal disease) on dialysis (HCC)    TTS; [facility] [place] (08/03/2016)   GERD (gastroesophageal reflux disease)    nexium prn   Gout    History of blood transfusion    related to kidneys; I've had 4 (01/21/2016)   Hypertension    Hypertensive urgency 12/05/2021   Migraine    last one 01/18/19   Myocardial infarction [facility]) 08/2022   Preterm labor ~ 2014   Retina hole, left    Seizures (HCC)    last one was in 2000; related to preeclampsia (01/21/2016)    Past Surgical History:  Procedure Laterality Date   A/V FISTULAGRAM Left 12/19/2021   Procedure: A/V Fistulagram;  Surgeon: Eliza Lonni RAMAN, MD;  Location: [facility] INVASIVE CV LAB;  Service: Cardiovascular;  Laterality: Left;   A/V FISTULAGRAM N/A 04/02/2023   Procedure: A/V Fistulagram;  Surgeon: Tobie Gordy POUR, MD;  Location: [facility] INVASIVE CV LAB;  Service: Cardiovascular;  Laterality: N/A;   AV FISTULA PLACEMENT Left 2010   AV FISTULA PLACEMENT Left 01/20/2019   Procedure: ARTERIOVENOUS (AV) FISTULA CREATION LEFT UPPER ARM;  Surgeon: Eliza Lonni RAMAN, MD;  Location: [facility] OR;  Service: Vascular;  Laterality: Left;   AV FISTULA PLACEMENT Left 12/19/2021   Procedure: ARTERIOVENOUS (AV) FISTULA CREATION POSSIBLE TUNNELED DIALYSIS;  Surgeon: Sheree Penne Lonni, MD;   Location: [facility] OR;  Service: Vascular;  Laterality: Left;   CERVICAL BIOPSY  W/ LOOP ELECTRODE EXCISION  2001   CORONARY/GRAFT ACUTE MI REVASCULARIZATION N/A 09/12/2022   Procedure: Coronary/Graft Acute MI Revascularization;  Surgeon: Dann Candyce RAMAN, MD;  Location: [facility] INVASIVE CV LAB;  Service: Cardiovascular;  Laterality: N/A;   DILATION AND EVACUATION  08/02/2011  Procedure: DILATATION AND EVACUATION;  Surgeon: Nathanel LELON Bunker, MD;  Location: [facility] ORS;  Service: Gynecology;;   DILATION AND EVACUATION N/A 08/31/2013   Procedure: DILATATION AND EVACUATION;  Surgeon: Lynwood KANDICE Solomons, MD;  Location: [facility] ORS;  Service: Gynecology;  Laterality: N/A;   FISTULA SUPERFICIALIZATION Left 01/20/2019   Procedure: Fistula Superficialization;  Surgeon: Eliza Lonni RAMAN, MD;  Location: [facility] OR;  Service: Vascular;  Laterality: Left;   HYDRADENITIS EXCISION Right    INSERTION OF DIALYSIS CATHETER Right 12/19/2021   Procedure: INSERTION OF A TUNNELED DIALYSIS CATHETER;  Surgeon: Sheree Penne Lonni, MD;  Location: [facility] OR;  Service: Vascular;  Laterality: Right;   LEFT HEART CATH AND CORONARY ANGIOGRAPHY N/A 09/12/2022   Procedure: LEFT HEART CATH AND CORONARY ANGIOGRAPHY;  Surgeon: Dann Candyce RAMAN, MD;  Location: [facility] INVASIVE CV LAB;  Service: Cardiovascular;  Laterality: N/A;   RENAL BIOPSY     REVISION OF ARTERIOVENOUS GORETEX GRAFT Left 02/11/2013   Procedure: REVISION OF ARTERIOVENOUS GORTEX FISTULA;  Surgeon: Krystal JULIANNA Doing, MD;  Location: [facility] OR;  Service: Vascular;  Laterality: Left;   THROMBECTOMY W/ EMBOLECTOMY Left 05/25/2018   Procedure: REPAIR OF BLEEDING ARTERIOVENOUS FISTULA;  Surgeon: Serene Gaile LELON, MD;  Location: [facility] OR;  Service: Vascular;  Laterality: Left;    Family History  Problem Relation Age of Onset   Diabetes Mother    Hyperlipidemia Mother    Hypertension Mother    Heart disease Mother    Hypertension Other    Diabetes type II Other     Prior to Admission  medications   Medication Sig Start Date End Date Taking? Authorizing Provider  acetaminophen  (TYLENOL ) 325 MG tablet Take 650 mg by mouth every 6 (six) hours as needed for moderate pain (pain score 4-6), fever or headache.   Yes [provider]  ALPRAZolam  (XANAX ) 1 MG tablet Take 1 tablet (1 mg total) by mouth 3 (three) times daily as needed for anxiety. Take 30 minutes prior to MRI Patient taking differently: Take 1 mg by mouth 3 (three) times daily. Take 30 minutes prior to MRI 11/17/22  Yes Persons, Ronal Dragon, PA  amiodarone  (PACERONE ) 200 MG tablet Take 1 tablet (200 mg total) by mouth daily. 12/21/22 12/25/23 Yes Vannie Reche RAMAN, NP  apixaban  (ELIQUIS ) 5 MG TABS tablet Take 1 tablet (5 mg total) by mouth 2 (two) times daily. 09/28/22  Yes Vannie Reche RAMAN, NP  atorvastatin  (LIPITOR) 80 MG tablet TAKE 1 TABLET(80 MG) BY MOUTH DAILY 09/20/23  Yes Lonni Slain, MD  cinacalcet  (SENSIPAR ) 90 MG tablet Take 90 mg by mouth every Tuesday, Thursday, and Saturday at 6 PM.   Yes [provider]  clopidogrel  (PLAVIX ) 75 MG tablet Take 1 tablet (75 mg total) by mouth daily with breakfast. 09/17/22  Yes Vicci Rollo SAUNDERS, PA-C  cyclobenzaprine  (FLEXERIL ) 10 MG tablet Take 10 mg by mouth 2 (two) times daily as needed for muscle spasms.   Yes [provider]  fluticasone  furoate-vilanterol (BREO ELLIPTA ) 100-25 MCG/ACT AEPB Inhale 1 puff into the lungs daily. 04/30/22  Yes Jude Harden GAILS, MD  Ketotifen  Fumarate (ALAWAY  OP) Place 1 drop into both eyes 2 (two) times daily as needed (eye dryness or irritation).   Yes [provider]  lactulose  (CHRONULAC ) 10 GM/15ML solution Take 30 mLs by mouth daily as needed (constipation). 09/04/21  Yes [provider]  midodrine  (PROAMATINE ) 10 MG tablet Take 10 mg by mouth 3 (three) times daily. 09/03/22  Yes [provider]  nystatin cream (MYCOSTATIN) Apply 1 Application topically 2 (two) times daily as needed (skin  irritation). Apply to recurrent boils on buttocks   Yes [provider]  ondansetron  (ZOFRAN -ODT) 8 MG disintegrating tablet Take 16 mg by mouth daily as needed for nausea or refractory nausea / vomiting.   Yes [provider]  oxyCODONE -acetaminophen  (PERCOCET) 10-325 MG tablet Take 1 tablet by mouth every 6 (six) hours as needed (severe pain). 04/06/23  Yes [provider]  sevelamer  carbonate (RENVELA ) 800 MG tablet Take 1,600 mg by mouth 3 (three) times daily with meals.   Yes [provider]  sucralfate  (CARAFATE ) 1 GM/10ML suspension Take 1 g by mouth 4 (four) times daily -  with meals and at bedtime.   Yes [provider]  B Complex-C-Folic Acid  (DIALYVITE 800) 0.8 MG TABS Take 1 tablet by mouth daily. Patient not taking: Reported on 10/21/2023    [provider]  NARCAN  4 MG/0.1ML LIQD nasal spray kit Place 1 spray into the nose as needed (accidental overdose). Patient not taking: No sig reported 11/14/18   [provider]    Current Facility-Administered Medications  Medication Dose Route Frequency Provider Last Rate Last Admin   acetaminophen  (TYLENOL ) tablet 650 mg  650 mg Oral Q6H PRN Patel, Ekta V, MD       Or   acetaminophen  (TYLENOL ) suppository 650 mg  650 mg Rectal Q6H PRN Tobie Mario GAILS, MD       albuterol  (PROVENTIL ) (2.5 MG/3ML) 0.083% nebulizer solution 2.5 mg  2.5 mg Nebulization Q6H PRN Tobie Mario GAILS, MD       apixaban  (ELIQUIS ) tablet 5 mg  5 mg Oral BID Tobie Mario GAILS, MD       atorvastatin  (LIPITOR) tablet 80 mg  80 mg Oral Daily Tobie Mario GAILS, MD       clopidogrel  (PLAVIX ) tablet 75 mg  75 mg Oral Q breakfast Tobie Mario GAILS, MD       fluticasone  furoate-vilanterol (BREO ELLIPTA ) 100-25 MCG/ACT 1 puff  1 puff Inhalation Daily Tobie Mario V, MD       ketotifen  (ZADITOR ) 0.035 % ophthalmic solution 1 drop  1 drop Both Eyes BID PRN Tobie Mario GAILS, MD       midodrine  (PROAMATINE ) tablet 10 mg  10 mg Oral TID Tobie Mario GAILS, MD   10 mg at 10/21/23 2220   pantoprazole  (PROTONIX ) injection 40 mg  40 mg Intravenous Q12H Tobie Mario V, MD   40 mg at 10/21/23 2220   sodium chloride  flush (NS) 0.9 % injection 3 mL  3 mL Intravenous Q12H Tobie Mario V, MD   3 mL at 10/21/23 2221   sucralfate  (CARAFATE ) 1 GM/10ML suspension 1 g  1 g Oral TID WC & HS Tobie Mario V, MD   1 g at 10/21/23 2220    Allergies as of 10/21/2023 - Review Complete 10/21/2023  Allergen Reaction Noted   Iron  sucrose Itching and Other (See Comments) 03/31/2023   Ms contin  [morphine ] Anaphylaxis and Shortness Of Breath 08/31/2013   Prednisone Other (See Comments) 01/05/2011   Richelle [fish allergy] Itching, Swelling, Rash, and Other (See Comments) 09/14/2013   Iodinated contrast media Itching 03/31/2012   Norvasc [amlodipine] Swelling 12/09/2017   Tape Itching 03/31/2012    Social History   Socioeconomic History   Marital status: Media planner    Spouse name: Not on file   Number of children: Not on file   Years of education: Not on file  Highest education level: Not on file  Occupational History   Not on file  Tobacco Use   Smoking status: Former    Types: Cigarettes    Passive exposure: Yes   Smokeless tobacco: Never   Tobacco comments:    Husband and daughter Smokes on patio  Vaping Use   Vaping status: Never Used  Substance and Sexual Activity   Alcohol  use: No   Drug use: Yes    Types: Marijuana    Comment: not recent   Sexual activity: Yes    Birth control/protection: None  Other Topics Concern   Not on file  Social History Narrative   Not on file   Social Drivers of Health   Financial Resource Strain: Not on file  Food Insecurity: Food Insecurity Present (10/21/2023)   Hunger Vital Sign    Worried About Running Out of Food in the Last Year: Sometimes true    Ran Out of Food in the Last Year: Never true  Transportation Needs: No Transportation Needs (10/21/2023)   PRAPARE - Scientist, research (physical sciences) (Medical): No    Lack of Transportation (Non-Medical): No  Physical Activity: Not on file  Stress: Not on file  Social Connections: Unknown (08/25/2023)   Social Connection and Isolation Panel    Frequency of Communication with Friends and Family: Three times a week    Frequency of Social Gatherings with Friends and Family: Three times a week    Attends Religious Services: Patient declined    Active Member of Clubs or Organizations: Patient declined    Attends Banker Meetings: Patient declined    Marital Status: Living with partner  Intimate Partner Violence: Not At Risk (10/21/2023)   Humiliation, Afraid, Rape, and Kick questionnaire    Fear of Current or Ex-Partner: No    Emotionally Abused: No    Physically Abused: No    Sexually Abused: No     Code Status   Code Status: Full Code  Review of Systems: All systems reviewed and negative except where noted in HPI.  Physical Exam: Vital signs in last 24 hours: Temp:  [97.5 F (36.4 C)-98.7 F (37.1 C)] 98.5 F (36.9 C) (08/08 0730) Pulse Rate:  [54-69] 63 (08/08 0730) Resp:  [11-23] 18 (08/08 0730) BP: (82-155)/(46-141) 105/68 (08/08 0730) SpO2:  [96 %-100 %] 100 % (08/08 0730) Weight:  [70.8 kg-72.5 kg] 72.5 kg (08/08 0439)    General:  Pleasant female in NAD Psych:  Cooperative. Normal mood and affect Eyes: Pupils equal Ears:  Normal auditory acuity Nose: No deformity, discharge or lesions Neck:  Supple, no masses felt Lungs:  Clear to auscultation.  Heart:  Regular rate.  Abdomen:  Soft, nondistended, nontender, active bowel sounds, no masses felt Rectal :  Deferred Msk: Symmetrical without gross deformities.  Neurologic:  Alert, oriented, grossly normal neurologically Extremities : No edema Skin:  Intact without significant lesions.    Intake/Output from previous day: 08/07 0701 - 08/08 0700 In: 1300 [P.O.:600; IV Piggyback:700] Out: -  Intake/Output this shift:  Total I/O In:  236 [P.O.:236] Out: -    Vina Dasen, NP-C   10/22/2023, 9:13 AM

## 2023-10-22 NOTE — Care Management CC44 (Signed)
 Condition Code 44 Documentation Completed  Patient Details  Name: Desiree [name] MRN: [id number] Date of Birth: [birth year]   Condition Code 44 given:  Yes Patient signature on Condition Code 44 notice:  Yes Documentation of 2 MD's agreement:  Yes Code 44 added to claim:  Yes    Sudie Erminio Deems, RN 10/22/2023, 11:57 AM

## 2023-10-25 ENCOUNTER — Encounter: Payer: Self-pay | Admitting: Physician Assistant

## 2023-10-26 LAB — CULTURE, BLOOD (ROUTINE X 2)
Culture: NO GROWTH
Culture: NO GROWTH
Special Requests: ADEQUATE
Special Requests: ADEQUATE

## 2023-12-02 ENCOUNTER — Encounter (HOSPITAL_BASED_OUTPATIENT_CLINIC_OR_DEPARTMENT_OTHER): Payer: Self-pay | Admitting: *Deleted

## 2023-12-21 ENCOUNTER — Ambulatory Visit: Admitting: Physician Assistant

## 2024-01-17 ENCOUNTER — Encounter: Payer: Self-pay | Admitting: Radiology

## 2024-02-06 ENCOUNTER — Emergency Department (HOSPITAL_COMMUNITY)
Admission: EM | Admit: 2024-02-06 | Discharge: 2024-02-06 | Disposition: A | Discharge status: Home / Self Care | Attending: Emergency Medicine | Admitting: Emergency Medicine

## 2024-02-06 ENCOUNTER — Other Ambulatory Visit: Payer: Self-pay

## 2024-02-06 ENCOUNTER — Encounter (HOSPITAL_COMMUNITY): Payer: Self-pay

## 2024-02-06 DIAGNOSIS — Z113 Encounter for screening for infections with a predominantly sexual mode of transmission: Secondary | ICD-10-CM

## 2024-02-06 DIAGNOSIS — B3731 Acute candidiasis of vulva and vagina: Secondary | ICD-10-CM

## 2024-02-06 DIAGNOSIS — N898 Other specified noninflammatory disorders of vagina: Secondary | ICD-10-CM | POA: Diagnosis present

## 2024-02-06 DIAGNOSIS — B9689 Other specified bacterial agents as the cause of diseases classified elsewhere: Secondary | ICD-10-CM

## 2024-02-06 DIAGNOSIS — Z7901 Long term (current) use of anticoagulants: Secondary | ICD-10-CM | POA: Insufficient documentation

## 2024-02-06 LAB — CBC WITH DIFFERENTIAL/PLATELET
Abs Immature Granulocytes: 0.01 K/uL (ref 0.00–0.07)
Basophils Absolute: 0.1 K/uL (ref 0.0–0.1)
Basophils Relative: 1 %
Eosinophils Absolute: 0.1 K/uL (ref 0.0–0.5)
Eosinophils Relative: 2 %
HCT: 37.7 % (ref 36.0–46.0)
Hemoglobin: 11.9 g/dL — ABNORMAL LOW (ref 12.0–15.0)
Immature Granulocytes: 0 %
Lymphocytes Relative: 37 %
Lymphs Abs: 2.2 K/uL (ref 0.7–4.0)
MCH: 28.9 pg (ref 26.0–34.0)
MCHC: 31.6 g/dL (ref 30.0–36.0)
MCV: 91.5 fL (ref 80.0–100.0)
Monocytes Absolute: 0.4 K/uL (ref 0.1–1.0)
Monocytes Relative: 7 %
Neutro Abs: 3.2 K/uL (ref 1.7–7.7)
Neutrophils Relative %: 53 %
Platelets: 241 K/uL (ref 150–400)
RBC: 4.12 MIL/uL (ref 3.87–5.11)
RDW: 17.1 % — ABNORMAL HIGH (ref 11.5–15.5)
WBC: 6 K/uL (ref 4.0–10.5)
nRBC: 0 % (ref 0.0–0.2)

## 2024-02-06 LAB — WET PREP, GENITAL
Sperm: NONE SEEN
Trich, Wet Prep: NONE SEEN
WBC, Wet Prep HPF POC: <10 (ref <10)

## 2024-02-06 LAB — BASIC METABOLIC PANEL WITH GFR
Anion gap: 18 — ABNORMAL HIGH (ref 5–15)
BUN: 27 mg/dL — ABNORMAL HIGH (ref 6–20)
CO2: 27 mmol/L (ref 22–32)
Calcium: 10.8 mg/dL — ABNORMAL HIGH (ref 8.9–10.3)
Chloride: 94 mmol/L — ABNORMAL LOW (ref 98–111)
Creatinine, Ser: 9.12 mg/dL — ABNORMAL HIGH (ref 0.44–1.00)
GFR, Estimated: 5 mL/min — ABNORMAL LOW (ref >60)
Glucose, Bld: 88 mg/dL (ref 70–99)
Potassium: 4.2 mmol/L (ref 3.5–5.1)
Sodium: 140 mmol/L (ref 135–145)

## 2024-02-06 LAB — HCG, SERUM, QUALITATIVE: Preg, Serum: NEGATIVE

## 2024-02-06 LAB — HIV ANTIBODY (ROUTINE TESTING W REFLEX): HIV Screen 4th Generation wRfx: NONREACTIVE

## 2024-02-06 MED ORDER — METRONIDAZOLE 500 MG PO TABS: 500.0000 mg | ORAL_TABLET | Freq: Two times a day (BID) | ORAL | 0 refills | Status: AC

## 2024-02-06 MED ORDER — FENTANYL CITRATE (PF) 50 MCG/ML IJ SOSY
50.0000 ug | PREFILLED_SYRINGE | Freq: Once | INTRAMUSCULAR | Status: AC
  Administered 2024-02-06: 50 ug via INTRAVENOUS
  Filled 2024-02-06: qty 1

## 2024-02-06 MED ORDER — STERILE WATER FOR INJECTION IJ SOLN
INTRAMUSCULAR | Status: AC
  Administered 2024-02-06: 2 mL
  Filled 2024-02-06: qty 10

## 2024-02-06 MED ORDER — METRONIDAZOLE 500 MG PO TABS
500.0000 mg | ORAL_TABLET | Freq: Once | ORAL | Status: AC
  Administered 2024-02-06: 500 mg via ORAL
  Filled 2024-02-06: qty 1

## 2024-02-06 MED ORDER — DOXYCYCLINE HYCLATE 100 MG PO CAPS: 100.0000 mg | ORAL_CAPSULE | Freq: Two times a day (BID) | ORAL | 0 refills | Status: AC

## 2024-02-06 MED ORDER — KETOROLAC TROMETHAMINE 30 MG/ML IJ SOLN: 30.0000 mg | Freq: Once | INTRAMUSCULAR | Status: DC

## 2024-02-06 MED ORDER — CEFTRIAXONE SODIUM 1 G IJ SOLR
500.0000 mg | Freq: Once | INTRAMUSCULAR | Status: AC
  Administered 2024-02-06: 500 mg via INTRAMUSCULAR
  Filled 2024-02-06: qty 10

## 2024-02-06 MED ORDER — FLUCONAZOLE 150 MG PO TABS
150.0000 mg | ORAL_TABLET | Freq: Once | ORAL | Status: AC
  Administered 2024-02-06: 150 mg via ORAL
  Filled 2024-02-06: qty 1

## 2024-02-06 MED ORDER — DOXYCYCLINE HYCLATE 100 MG PO TABS
100.0000 mg | ORAL_TABLET | Freq: Once | ORAL | Status: AC
  Administered 2024-02-06: 100 mg via ORAL
  Filled 2024-02-06: qty 1

## 2024-02-06 NOTE — ED Provider Notes (Signed)
 [place] EMERGENCY DEPARTMENT AT [facility] Provider Note   CSN: [id number] Arrival date & time: 02/06/24  1042     Patient presents with: Vaginal Discharge and Exposure to STD   Desiree [name] is a 48 y.o. female.   HPI   48 year old female presents emergency department concern for STD.  She is currently sexually active.  Reports that for the past couple days she has been having white/clear vaginal discharge.  Typically she does not have any at baseline.  Denies any vaginal bleeding.  She is asking for pregnancy test and STD testing.  Prior to Admission medications   Medication Sig Start Date End Date Taking? Authorizing Provider  acetaminophen  (TYLENOL ) 325 MG tablet Take 650 mg by mouth every 6 (six) hours as needed for moderate pain (pain score 4-6), fever or headache.    [provider]  ALPRAZolam  (XANAX ) 1 MG tablet Take 1 tablet (1 mg total) by mouth 3 (three) times daily as needed for anxiety. Take 30 minutes prior to MRI Patient taking differently: Take 1 mg by mouth 3 (three) times daily. Take 30 minutes prior to MRI 11/17/22   Persons, Ronal Dragon, PA  amiodarone  (PACERONE ) 200 MG tablet Take 1 tablet (200 mg total) by mouth daily. 12/21/22 12/25/23  Vannie Reche RAMAN, NP  apixaban  (ELIQUIS ) 5 MG TABS tablet Take 1 tablet (5 mg total) by mouth 2 (two) times daily. 09/28/22   Walker, Caitlin S, NP  atorvastatin  (LIPITOR ) 80 MG tablet TAKE 1 TABLET(80 MG) BY MOUTH DAILY 09/20/23   Lonni Slain, MD  B Complex-C-Folic Acid  (DIALYVITE 800) 0.8 MG TABS Take 1 tablet by mouth daily. Patient not taking: Reported on 10/21/2023    [provider]  cinacalcet  (SENSIPAR ) 90 MG tablet Take 90 mg by mouth every Tuesday, Thursday, and Saturday at 6 PM.    [provider]  clopidogrel  (PLAVIX ) 75 MG tablet Take 1 tablet (75 mg total) by mouth daily with breakfast. 09/17/22   Louder Rollo SAUNDERS, PA-C  cyclobenzaprine  (FLEXERIL ) 10 MG tablet Take 10  mg by mouth 2 (two) times daily as needed for muscle spasms.    [provider]  fluticasone  furoate-vilanterol (BREO ELLIPTA ) 100-25 MCG/ACT AEPB Inhale 1 puff into the lungs daily. 04/30/22   Jude Harden GAILS, MD  Ketotifen  Fumarate (ALAWAY  OP) Place 1 drop into both eyes 2 (two) times daily as needed (eye dryness or irritation).    [provider]  lactulose  (CHRONULAC ) 10 GM/15ML solution Take 30 mLs by mouth daily as needed (constipation). 09/04/21   [provider]  midodrine  (PROAMATINE ) 10 MG tablet Take 10 mg by mouth 3 (three) times daily. 09/03/22   [provider]  NARCAN  4 MG/0.1ML LIQD nasal spray kit Place 1 spray into the nose as needed (accidental overdose). Patient not taking: No sig reported 11/14/18   [provider]  nystatin cream (MYCOSTATIN) Apply 1 Application topically 2 (two) times daily as needed (skin irritation). Apply to recurrent boils on buttocks    [provider]  ondansetron  (ZOFRAN -ODT) 8 MG disintegrating tablet Take 16 mg by mouth daily as needed for nausea or refractory nausea / vomiting.    [provider]  oxyCODONE -acetaminophen  (PERCOCET) 10-325 MG tablet Take 1 tablet by mouth every 6 (six) hours as needed (severe pain). 04/06/23   [provider]  pantoprazole  (PROTONIX ) 40 MG tablet Take 1 tablet (40 mg total) by mouth daily. 10/22/23 10/21/24  Jonel Lonni SQUIBB, MD  sevelamer  carbonate (  RENVELA ) 800 MG tablet Take 1,600 mg by mouth 3 (three) times daily with meals.    [provider]    Allergies: Iron  sucrose, Ms contin  [morphine ], Prednisone, Tuna [fish allergy], Iodinated contrast media, Norvasc [amlodipine], and Tape    Review of Systems  Constitutional:  Positive for chills and fatigue. Negative for fever.  Respiratory:  Negative for shortness of breath.   Cardiovascular:  Negative for chest pain.  Gastrointestinal:  Negative for abdominal pain, diarrhea and vomiting.   Genitourinary:  Positive for pelvic pain, vaginal discharge and vaginal pain. Negative for dysuria and vaginal bleeding.  Skin:  Negative for rash.  Neurological:  Negative for headaches.    Updated Vital Signs BP 106/66 (BP Location: Right Arm)   Pulse 65   Temp 98.5 F (36.9 C) (Oral)   Resp 15   SpO2 100%   Physical Exam Vitals and nursing note reviewed.  Constitutional:      Appearance: Normal appearance.  HENT:     Head: Normocephalic.     Mouth/Throat:     Mouth: Mucous membranes are moist.  Cardiovascular:     Rate and Rhythm: Normal rate.  Pulmonary:     Effort: Pulmonary effort is normal. No respiratory distress.  Abdominal:     Palpations: Abdomen is soft.  Genitourinary:    Comments: Chaperoned by nurse tech Avelina, copious amounts of thin white discharge, no odor, mild CMT, no unilateral adnexal findings Skin:    General: Skin is warm.  Neurological:     Mental Status: She is alert and oriented to person, place, and time. Mental status is at baseline.  Psychiatric:        Mood and Affect: Mood normal.     (all labs ordered are listed, but only abnormal results are displayed) Labs Reviewed  CBC WITH DIFFERENTIAL/PLATELET - Abnormal; Notable for the following components:      Result Value   Hemoglobin 11.9 (*)    RDW 17.1 (*)    All other components within normal limits  BASIC METABOLIC PANEL WITH GFR - Abnormal; Notable for the following components:   Chloride 94 (*)    BUN 27 (*)    Creatinine, Ser 9.12 (*)    Calcium  10.8 (*)    GFR, Estimated 5 (*)    Anion gap 18 (*)    All other components within normal limits  WET PREP, GENITAL  HCG, SERUM, QUALITATIVE  HIV ANTIBODY (ROUTINE TESTING W REFLEX)  GC/CHLAMYDIA PROBE AMP ([place]) NOT AT [facility]    EKG: None  Radiology: No results found.   Procedures   Medications Ordered in the ED - No data to display                                  Medical Decision Making Amount and/or  Complexity of Data Reviewed Labs: ordered.  Risk Prescription drug management.   48 year old female presents to the emergency department with concern for vaginal discharge and lower pelvic discomfort.  She is sexually active, has concern for STD exposure.  Vitals are stable.  Pelvic reveals large amount of thick white/gray discharge, no odor.  Mild CMT but no unilateral adnexal findings.  Wet prep is significant for clue cells and yeast.  Patient requesting treatment for STDs as well.  Chlamydia and GC swab has been sent off.  Patient completed treatment, will be placed on antibiotics and plan for outpatient follow-up.  Patient at this time appears safe and stable for discharge and close outpatient follow up. Discharge plan and strict return to ED precautions discussed, patient verbalizes understanding and agreement.     Final diagnoses:  None    ED Discharge Orders     None          Bari Roxie HERO, DO 02/06/24 1453

## 2024-02-06 NOTE — ED Notes (Signed)
 Pt has called husband for ride home.   Discharge instructions, medications, and follow up care reviewed with and provided to pt. Pt denies any further questions, and has verbalized understanding.

## 2024-02-06 NOTE — ED Notes (Addendum)
 Pt provided with water per request.

## 2024-02-06 NOTE — Discharge Instructions (Signed)
 You have been seen and discharged from the emergency department.  Your swab was significant for bacterial and yeast infection.  Cultures are pending to evaluate for chlamydia and gonorrhea.  Today you were treated with antibiotics for possible STD (chlamydia/gonorrhea) as well as bacterial vaginosis and yeast infection.  Do not drink with the antibiotic medication, this can cause a reaction and make you ill.  Follow-up with your primary provider for further evaluation and further care. Take home medications as prescribed. If you have any worsening symptoms or further concerns for your health please return to an emergency department for further evaluation.

## 2024-02-06 NOTE — ED Triage Notes (Signed)
 PT arrives via POV. Pt reports burning in her vagina and clear vaginal discharge for the past few days. Concerned she may have an STD. PT is on dialysis, Tues, Thur, Sat. Pt report some lower back pain and lower abdominal pain as well. States she does not make urine. PT is AxOx4. Also requests a pregnancy test.

## 2024-02-07 LAB — GC/CHLAMYDIA PROBE AMP (~~LOC~~) NOT AT ARMC
Chlamydia: NEGATIVE
Comment: NEGATIVE
Comment: NORMAL
Neisseria Gonorrhea: NEGATIVE

## 2024-03-19 ENCOUNTER — Other Ambulatory Visit (HOSPITAL_BASED_OUTPATIENT_CLINIC_OR_DEPARTMENT_OTHER): Payer: Self-pay | Admitting: Cardiology

## 2024-03-19 DIAGNOSIS — E785 Hyperlipidemia, unspecified: Secondary | ICD-10-CM

## 2024-03-19 DIAGNOSIS — I25118 Atherosclerotic heart disease of native coronary artery with other forms of angina pectoris: Secondary | ICD-10-CM

## 2024-03-21 NOTE — Telephone Encounter (Signed)
 Lipids last checked August 27, 2023 per chart.

## 2024-03-21 NOTE — Telephone Encounter (Signed)
 Pt of Dr. Lonni. Pt has not had a lipid panel in past 12 mo. Does Dr. Lonni want to refill? Please advise.

## 2024-04-17 ENCOUNTER — Encounter (HOSPITAL_BASED_OUTPATIENT_CLINIC_OR_DEPARTMENT_OTHER): Payer: Self-pay

## 2024-04-17 NOTE — Progress Notes (Incomplete)
 " Cardiology Office Note:  .   Date:  04/17/2024  ID:  Desiree [name], DOB 04-Mar-1976, MRN [id number] PCP: [facility]  [facility] Providers Cardiologist:  Desiree Bruckner, MD {  History of Present Illness: .   Desiree [name] is a 49 y.o. female ESRD on HD T/TH/S, hypertension, seizures, migraines, chronic respiratory failure on 3 L home O2, CAD s/p thrombotic anterior STEMI 09/12/2022 with angioplasty, atrial fibrillation.   Pertinent CV history: She presented 09/12/22 due to severe crushing chest pain associate with nausea and vomiting.  Found to have anterior ST elevations by EMS.  Underwent LHC 09/13/2022 with finding suspected be thromboembolic event to coronaries, likely from previously undetected atrial fibrillation. Occlusion of left distal OM 3 and apical LAD noted, angioplasty performed but minimal flow improvement. Echocardiogram LVEF 35-40%.  It also revealed calcified mobile MAC with no clinical signs of endocarditis.  She was discharged on Eliquis , Plavix .  BP limited titration of heart failure medications.   Readmitted 7/18 - 10/03/2022 with chest pain.  Repeat limited echo LVEF 45-50%, no RWMA, moderate pericardial effusion without tamponade.  Chest pain felt to be related to panic attacks.   Admitted 8/20 - 11/05/2022 after presenting with palpitations.  EKG A-fib with RVR.  She was started on amiodarone  drip.  Was transitioned to oral amiodarone .  Today: Checking BP and HR every day, both are intermittently low. No similar angina to her presentation in June, but has panic attacks intermittently that do not feel like her chest pain. She feels nervous, nauseated, like I am running away from myself.   Having hypotension with dialysis, trying to make adjustments to help this. She and her husband have struggled with changes to her dry weight, and they feel that she gets fluid pushed back in as her post HD weight is higher than her pre HD weight.   She  remains on chronic O2 by nasal cannula.  Reviewed monitor results, no significant arrhythmias. Symptomatic event was sinus.  ROS: Denies chest pain, PND, orthopnea, syncope or palpitations.   Studies Reviewed: SABRA    EKG:       Physical Exam:   VS:  There were no vitals taken for this visit.   Wt Readings from Last 3 Encounters:  10/22/23 159 lb 13.3 oz (72.5 kg)  09/21/23 158 lb 8 oz (71.9 kg)  08/27/23 156 lb 12 oz (71.1 kg)    GEN: Chronically ill appearing, on O2 by nasal cannula HEENT: Normal, moist mucous membranes NECK: No JVD CARDIAC: regular rhythm, normal S1 and S2, no rubs or gallops. No murmur. VASCULAR: Radial and DP pulses 2+ bilaterally. No carotid bruits RESPIRATORY:  Clear to auscultation without rales, wheezing or rhonchi  ABDOMEN: Soft, non-tender, non-distended MUSCULOSKELETAL:  Ambulates independently SKIN: Warm and dry, trivial bilateral LE edema NEUROLOGIC:  Alert and oriented x 3. No focal neuro deficits noted. PSYCHIATRIC:  Normal affect    ASSESSMENT AND PLAN: .    Paroxysmal atrial fibrillation History of arterial embolism (coronaries) -now on long term amiodarone  for rhythm management -CHA2DS2/VAS Stroke Risk Points=4  -continue long term apixaban  -reviewed monitor today, no significant arrhythmias  Chronic systolic heart failure -volume management by HD -chronic hypotension prevents use of GDMT  ESRD on HD Hypotension with dialysis -has midodrine  PRN but has not used, reviewed indications for this today  STEMI 08/2022 from embolization of thrombus in apical LAD and OM3 -had balloon but no return of flow in distal LAD, slow flow in  OM3  Dispo: 3 months or sooner as needed  Signed, Desiree Bruckner, MD   Desiree Bruckner, MD, PhD, [facility]  [facility]  [place]  [facility] [employer] [facility] [address] [place], [place] [address] [phone]   "

## 2024-04-18 ENCOUNTER — Ambulatory Visit (HOSPITAL_BASED_OUTPATIENT_CLINIC_OR_DEPARTMENT_OTHER): Admitting: Cardiology

## 2024-04-21 ENCOUNTER — Ambulatory Visit (HOSPITAL_BASED_OUTPATIENT_CLINIC_OR_DEPARTMENT_OTHER): Admitting: Cardiology
